# Patient Record
Sex: Male | Born: 1945
Health system: Southern US, Community
[De-identification: ages and names within clinical notes are randomized; demographics above are authoritative.]

## PROBLEM LIST (undated history)

## (undated) DIAGNOSIS — G8929 Other chronic pain: Secondary | ICD-10-CM

## (undated) DIAGNOSIS — Z8719 Personal history of other diseases of the digestive system: Secondary | ICD-10-CM

## (undated) DIAGNOSIS — R634 Abnormal weight loss: Secondary | ICD-10-CM

## (undated) DIAGNOSIS — R51 Headache: Secondary | ICD-10-CM

## (undated) DIAGNOSIS — IMO0001 Reserved for inherently not codable concepts without codable children: Secondary | ICD-10-CM

## (undated) DIAGNOSIS — N429 Disorder of prostate, unspecified: Secondary | ICD-10-CM

## (undated) DIAGNOSIS — R351 Nocturia: Secondary | ICD-10-CM

## (undated) DIAGNOSIS — E785 Hyperlipidemia, unspecified: Secondary | ICD-10-CM

## (undated) DIAGNOSIS — K2990 Gastroduodenitis, unspecified, without bleeding: Secondary | ICD-10-CM

## (undated) DIAGNOSIS — M1990 Unspecified osteoarthritis, unspecified site: Secondary | ICD-10-CM

## (undated) DIAGNOSIS — K648 Other hemorrhoids: Secondary | ICD-10-CM

## (undated) DIAGNOSIS — M436 Torticollis: Secondary | ICD-10-CM

## (undated) DIAGNOSIS — K2970 Gastritis, unspecified, without bleeding: Secondary | ICD-10-CM

## (undated) DIAGNOSIS — I10 Essential (primary) hypertension: Secondary | ICD-10-CM

## (undated) DIAGNOSIS — J449 Chronic obstructive pulmonary disease, unspecified: Secondary | ICD-10-CM

## (undated) DIAGNOSIS — J189 Pneumonia, unspecified organism: Secondary | ICD-10-CM

## (undated) DIAGNOSIS — Z5189 Encounter for other specified aftercare: Secondary | ICD-10-CM

## (undated) DIAGNOSIS — K259 Gastric ulcer, unspecified as acute or chronic, without hemorrhage or perforation: Secondary | ICD-10-CM

## (undated) HISTORY — DX: Abnormal weight loss: R63.4

## (undated) HISTORY — DX: Other chronic pain: G89.29

## (undated) HISTORY — DX: Hyperlipidemia, unspecified: E78.5

## (undated) HISTORY — DX: Gastric ulcer, unspecified as acute or chronic, without hemorrhage or perforation: K25.9

## (undated) HISTORY — DX: Gastritis, unspecified, without bleeding: K29.90

## (undated) HISTORY — DX: Gastritis, unspecified, without bleeding: K29.70

## (undated) HISTORY — DX: Personal history of other diseases of the digestive system: Z87.19

## (undated) HISTORY — DX: Chronic obstructive pulmonary disease, unspecified: J44.9

## (undated) HISTORY — PX: COLONOSCOPY: SHX174

---

## 1990-01-31 HISTORY — PX: CERVICAL FUSION: SHX112

## 2001-11-02 ENCOUNTER — Encounter: Payer: Self-pay | Admitting: General Surgery

## 2001-11-08 ENCOUNTER — Ambulatory Visit (HOSPITAL_COMMUNITY): Admission: RE | Admit: 2001-11-08 | Discharge: 2001-11-08 | Payer: Self-pay | Admitting: General Surgery

## 2001-11-08 ENCOUNTER — Encounter (INDEPENDENT_AMBULATORY_CARE_PROVIDER_SITE_OTHER): Payer: Self-pay | Admitting: Specialist

## 2002-03-26 ENCOUNTER — Emergency Department (HOSPITAL_COMMUNITY): Admission: EM | Admit: 2002-03-26 | Discharge: 2002-03-26 | Payer: Self-pay | Admitting: Emergency Medicine

## 2002-03-26 ENCOUNTER — Encounter: Payer: Self-pay | Admitting: Emergency Medicine

## 2004-09-19 ENCOUNTER — Inpatient Hospital Stay (HOSPITAL_COMMUNITY): Admission: EM | Admit: 2004-09-19 | Discharge: 2004-09-22 | Payer: Self-pay | Admitting: Emergency Medicine

## 2004-09-19 ENCOUNTER — Ambulatory Visit: Payer: Self-pay | Admitting: Internal Medicine

## 2004-09-19 ENCOUNTER — Ambulatory Visit: Payer: Self-pay | Admitting: Cardiology

## 2004-09-20 ENCOUNTER — Encounter: Payer: Self-pay | Admitting: Cardiology

## 2004-09-21 ENCOUNTER — Ambulatory Visit: Payer: Self-pay | Admitting: Internal Medicine

## 2004-09-30 ENCOUNTER — Ambulatory Visit: Payer: Self-pay | Admitting: Family Medicine

## 2004-10-12 ENCOUNTER — Ambulatory Visit: Payer: Self-pay | Admitting: Family Medicine

## 2005-03-25 ENCOUNTER — Ambulatory Visit: Payer: Self-pay | Admitting: Family Medicine

## 2006-10-06 DIAGNOSIS — G8929 Other chronic pain: Secondary | ICD-10-CM | POA: Insufficient documentation

## 2006-10-06 DIAGNOSIS — M545 Low back pain, unspecified: Secondary | ICD-10-CM | POA: Insufficient documentation

## 2006-10-06 HISTORY — DX: Other chronic pain: G89.29

## 2007-01-04 ENCOUNTER — Ambulatory Visit: Payer: Self-pay | Admitting: Family Medicine

## 2007-01-04 DIAGNOSIS — H60399 Other infective otitis externa, unspecified ear: Secondary | ICD-10-CM | POA: Insufficient documentation

## 2007-03-05 ENCOUNTER — Ambulatory Visit: Payer: Self-pay | Admitting: Family Medicine

## 2007-03-05 DIAGNOSIS — I10 Essential (primary) hypertension: Secondary | ICD-10-CM

## 2007-03-05 HISTORY — DX: Essential (primary) hypertension: I10

## 2007-03-26 ENCOUNTER — Ambulatory Visit: Payer: Self-pay | Admitting: Internal Medicine

## 2007-03-26 DIAGNOSIS — J449 Chronic obstructive pulmonary disease, unspecified: Secondary | ICD-10-CM | POA: Diagnosis present

## 2007-03-26 DIAGNOSIS — R0989 Other specified symptoms and signs involving the circulatory and respiratory systems: Secondary | ICD-10-CM | POA: Insufficient documentation

## 2007-03-26 DIAGNOSIS — J439 Emphysema, unspecified: Secondary | ICD-10-CM | POA: Insufficient documentation

## 2007-03-26 DIAGNOSIS — R0609 Other forms of dyspnea: Secondary | ICD-10-CM | POA: Insufficient documentation

## 2007-04-04 ENCOUNTER — Ambulatory Visit: Payer: Self-pay | Admitting: Family Medicine

## 2007-04-04 DIAGNOSIS — K219 Gastro-esophageal reflux disease without esophagitis: Secondary | ICD-10-CM | POA: Insufficient documentation

## 2007-04-20 DIAGNOSIS — K122 Cellulitis and abscess of mouth: Secondary | ICD-10-CM | POA: Insufficient documentation

## 2007-04-23 ENCOUNTER — Ambulatory Visit: Payer: Self-pay | Admitting: Family Medicine

## 2008-05-16 ENCOUNTER — Telehealth: Payer: Self-pay | Admitting: Family Medicine

## 2008-10-07 ENCOUNTER — Ambulatory Visit: Payer: Self-pay | Admitting: Family Medicine

## 2008-10-07 DIAGNOSIS — K5909 Other constipation: Secondary | ICD-10-CM | POA: Insufficient documentation

## 2008-10-09 ENCOUNTER — Telehealth: Payer: Self-pay | Admitting: Family Medicine

## 2008-10-10 ENCOUNTER — Ambulatory Visit: Payer: Self-pay | Admitting: Family Medicine

## 2008-10-29 ENCOUNTER — Telehealth (INDEPENDENT_AMBULATORY_CARE_PROVIDER_SITE_OTHER): Payer: Self-pay | Admitting: *Deleted

## 2009-01-01 ENCOUNTER — Ambulatory Visit: Payer: Self-pay | Admitting: Family Medicine

## 2009-01-01 DIAGNOSIS — M5137 Other intervertebral disc degeneration, lumbosacral region: Secondary | ICD-10-CM | POA: Insufficient documentation

## 2009-01-02 ENCOUNTER — Ambulatory Visit: Payer: Self-pay | Admitting: Family Medicine

## 2009-01-05 ENCOUNTER — Telehealth: Payer: Self-pay | Admitting: Family Medicine

## 2009-01-06 ENCOUNTER — Encounter: Admission: RE | Admit: 2009-01-06 | Discharge: 2009-01-06 | Payer: Self-pay | Admitting: Family Medicine

## 2009-01-07 ENCOUNTER — Telehealth: Payer: Self-pay | Admitting: Family Medicine

## 2009-01-14 ENCOUNTER — Encounter: Admission: RE | Admit: 2009-01-14 | Discharge: 2009-01-14 | Payer: Self-pay | Admitting: Family Medicine

## 2009-01-19 ENCOUNTER — Telehealth: Payer: Self-pay | Admitting: Family Medicine

## 2009-01-28 ENCOUNTER — Telehealth: Payer: Self-pay | Admitting: Family Medicine

## 2009-01-29 ENCOUNTER — Telehealth: Payer: Self-pay | Admitting: Family Medicine

## 2009-02-04 ENCOUNTER — Encounter: Admission: RE | Admit: 2009-02-04 | Discharge: 2009-02-04 | Payer: Self-pay | Admitting: Family Medicine

## 2009-02-23 ENCOUNTER — Ambulatory Visit: Payer: Self-pay | Admitting: Family Medicine

## 2009-02-23 DIAGNOSIS — J45909 Unspecified asthma, uncomplicated: Secondary | ICD-10-CM | POA: Insufficient documentation

## 2009-04-13 ENCOUNTER — Telehealth: Payer: Self-pay | Admitting: Family Medicine

## 2009-04-23 ENCOUNTER — Emergency Department (HOSPITAL_COMMUNITY): Admission: EM | Admit: 2009-04-23 | Discharge: 2009-04-23 | Payer: Self-pay | Admitting: Emergency Medicine

## 2009-05-01 ENCOUNTER — Telehealth: Payer: Self-pay | Admitting: Family Medicine

## 2010-03-02 NOTE — Progress Notes (Signed)
Summary: Med refill  Phone Note Call from Patient   Caller: Patient Reason for Call: Refill Medication Summary of Call: Patient states his RX is not at the drug strore - was it sent in?  Name of drug store or RX not left.  Initial call taken by: Everrett Coombe,  May 01, 2009 1:04 PM  Follow-up for Phone Call        Pt called back to adv that he needs to have Rx for meds: Tenoretic 50 50-25 Mg   -and-   Nexium 40 Mg..... Pt adv that Rx for same can be [facility] [place].  Pt can be reached at [phone] with any questions or concerns.  Follow-up by: Debbra Riding,  May 01, 2009 1:43 PM    Prescriptions: NEXIUM 40 MG  CPDR (ESOMEPRAZOLE MAGNESIUM) Take  one 30-60 min before first meal of the day  #100 x 0   Entered by:   Kern Reap CMA (AAMA)   Authorized by:   Roderick Pee MD   Signed by:   Kern Reap CMA (AAMA) on 05/01/2009   Method used:   [facility]  [place] [address] [facility]       [address]       [place], [place]  [address]       Ph: [phone] or [phone]       Fax: [phone]   RxID:   [id number] TENORETIC 50 50-25 MG  TABS (ATENOLOL-CHLORTHALIDONE) 1/2 tab qam  #100 Tablet x 0   Entered by:   Kern Reap CMA (AAMA)   Authorized by:   Roderick Pee MD   Signed by:   Kern Reap CMA (AAMA) on 05/01/2009   Method used:   [facility]        [facility]  [place] [address] [facility]       [address]       [place], [place]  [address]       Ph: [phone] or [phone]       Fax: [phone]   RxID:   [id number]

## 2010-03-02 NOTE — Assessment & Plan Note (Signed)
Summary: constipation/dm   Vital Signs:  Patient profile:   65 year old male BP sitting:   110 / 80  (left arm) Cuff size:   regular  Vitals Entered By: Kern Reap CMA Duncan Dull) (October 10, 2008 1:57 PM)  Reason for Visit constipation  Primary Care Provider:  Alonza Smoker   History of Present Illness: Michael Bender is a 65 year old male, who comes in today for evaluation of constipation.  Because of his chronic narcotic use for chronic, pain.  He's had trouble with constipation.  Early in the week.  We tried different strategies nothing has worked.  Allergies: 1)  ! * Nubane  Past History:  Past medical, surgical, family and social histories (including risk factors) reviewed for relevance to current acute and chronic problems.  Past Medical History: Reviewed history from 04/04/2007 and no changes required. chronic pain cervical surgery x 4 t/h pneumonia with atrial fibrillation recovered without sequelae COPD GERD  Past Surgical History: Reviewed history from 10/06/2006 and no changes required. cervical spine X4 I & H rectal prolapse pneumonia LLL  08/06 atrial fibrillation/anemia  Family History: Reviewed history from 03/26/2007 and no changes required. Cancer ? etiology Father Heart dz mid 62's   Social History: Reviewed history from 03/26/2007 and no changes required. Single Divorced Quit in 2005 Alcohol use-no Drug use-no Regular exercise-yes  Review of Systems      See HPI  Physical Exam  General:  Well-developed,well-nourished,in no acute distress; alert,appropriate and cooperative throughout examination   Impression & Recommendations:  Problem # 1:  CONSTIPATION, CHRONIC (ICD-564.09) Assessment Unchanged  His updated medication list for this problem includes:    Miralax Pack (Polyethylene glycol 3350) .Marland Kitchen... 1 daily    Golytely 236 Gm Solr (Peg 3350-kcl-nabcb-nacl-nasulf) .Marland Kitchen... Take 4 oz every 15 minutes until bottle gone  Orders:  Prescription Created Electronically [phone])  Complete Medication List: 1)  Methadone Hcl 10 Mg Tabs (Methadone hcl) .... Take 1 tablet by mouth three times a day 2)  Amitriptyline Hcl 100 Mg Tabs (Amitriptyline hcl) .Marland Kitchen.. 1 tab two times a day 3)  Diazepam 5 Mg Tabs (Diazepam) .... Take 1 tablet by mouth three times a day 4)  Tenoretic 50 50-25 Mg Tabs (Atenolol-chlorthalidone) .... 1/2 tab qam 5)  Miralax Pack (Polyethylene glycol 3350) .Marland Kitchen.. 1 daily 6)  Nexium 40 Mg Cpdr (Esomeprazole magnesium) .... Take  one 30-60 min before first meal of the day 7)  Golytely 236 Gm Solr (Peg 3350-kcl-nabcb-nacl-nasulf) .... Take 4 oz every 15 minutes until bottle gone  Patient Instructions: 1)  begin 4 ounces of GoLYTELY every 15 minutes until the 2 L are empty or until you have a good bowel movement. 2)  Tomorrow begin milk of Magnesia 4 ounces twice a day and MiraLax once daily.  Also, drink 30 ounces of water daily and add fruit and fiber tear dye Prescriptions: GOLYTELY 236 GM SOLR (PEG 3350-KCL-NABCB-NACL-NASULF) take 4 oz every 15 minutes until bottle gone  #2 liter x 0   Entered by:   Kern Reap CMA (AAMA)   Authorized by:   Roderick Pee MD   Signed by:   Kern Reap CMA (AAMA) on 10/10/2008   Method used:   [facility]        [facility]  [place] [address]       [address]       [place], [place]  [address]       Ph: [phone] or [phone]  Fax: [phone]   RxID:   [id number]

## 2010-03-02 NOTE — Progress Notes (Signed)
Summary: pt wants to know how long he waits to do enema after magnesium  Phone Note Call from Patient Call back at Home Phone [phone]   Caller: Patient Call For: Roderick Pee MD Summary of Call: Pt said that he thought he was told to take 2 bottles of magnesium citrate and then use fleet enema. Pt wants to know how long does he wait after he drinks the magnesium, before he uses the enema.  Initial call taken by: Lucy Antigua,  October 09, 2008 4:48 PM  Follow-up for Phone Call        Pt. has not had BM, scheduled with Dr. Tawanna Cooler today.  Concerned about obstruction? Follow-up by: Lynann Beaver CMA,  October 10, 2008 10:26 AM  Additional Follow-up for Phone Call Additional follow up Details #1::        one hour Additional Follow-up by: Roderick Pee MD,  October 10, 2008 2:47 PM    Additional Follow-up for Phone Call Additional follow up Details #2::    left message on machine for patient  Follow-up by: Kern Reap CMA Duncan Dull),  October 10, 2008 5:31 PM

## 2010-03-02 NOTE — Progress Notes (Signed)
Summary: nexium refill  Phone Note Refill Request   Refills Requested: Medication #1:  NEXIUM 40 MG  CPDR Take  one 30-60 min before first meal of the day. Initial call taken by: Kern Reap CMA,  May 16, 2008 1:13 PM      Prescriptions: NEXIUM 40 MG  CPDR (ESOMEPRAZOLE MAGNESIUM) Take  one 30-60 min before first meal of the day  #100 x 0   Entered by:   Kern Reap CMA   Authorized by:   Roderick Pee MD   Signed by:   Kern Reap CMA on 05/16/2008   Method used:   [facility]        [facility]  [place] [address]       [address]       [place], [place]  [address]       Ph: [phone] or [phone]       Fax: [phone]   RxID:   [id number]

## 2010-03-02 NOTE — Progress Notes (Signed)
Summary: continued constipation  Phone Note Call from Patient   Caller: Patient Call For: Roderick Pee MD Summary of Call: Pt calls stating he took the Magnesium Citrate, and has not had a BM, just a small amount of "leaking".  Is very concerned. [phone] Initial call taken by: Lynann Beaver CMA,  October 09, 2008 10:07 AM  Follow-up for Phone Call        double with the mag citrate to 6 ounces twice a day.  Also may use a fleets enema Follow-up by: Roderick Pee MD,  October 09, 2008 10:09 AM  Additional Follow-up for Phone Call Additional follow up Details #1::        Pt given Dr. Nelida Meuse recommendations. Additional Follow-up by: Lynann Beaver CMA,  October 09, 2008 10:14 AM

## 2010-03-02 NOTE — Assessment & Plan Note (Signed)
Summary: follow up/med refill/per Dr. Beatrix Fetters   Vital Signs:  Patient profile:   65 year old male Weight:      185 pounds BMI:     25.90 Temp:     98.2 degrees F oral BP sitting:   120 / 80  (left arm) Cuff size:   regular  Vitals Entered By: Kern Reap CMA (AAMA) (October 07, 2008 11:12 AM)  Reason for Visit follow up meds  Primary Care Provider:  Alonza Smoker   History of Present Illness: Michael Bender is a 65 year old male, who comes in today for evaluation of constipation.  He has chronic pain in his on methadone 10 mg t.i.d., Valium 5 mg t.i.d., and Septra with chronic constipation.  Because of his above medications.  A normal colonoscopy 3 years ago.  Most recently he states the constipation has flared up his reflux esophagitis.  He started Nexium one tablet yesterday.  It helped.  The reflexes burning midepigastric not related to eating.  Recently, he is not had a bowel movement except for every 6 days.  He's had to resort to mag citrate 300 cc daily to have a bowel movement.  Allergies: 1)  ! * Nubane  Past History:  Past medical, surgical, family and social histories (including risk factors) reviewed for relevance to current acute and chronic problems.  Past Medical History: Reviewed history from 04/04/2007 and no changes required. chronic pain cervical surgery x 4 t/h pneumonia with atrial fibrillation recovered without sequelae COPD GERD  Past Surgical History: Reviewed history from 10/06/2006 and no changes required. cervical spine X4 I & H rectal prolapse pneumonia LLL  08/06 atrial fibrillation/anemia  Family History: Reviewed history from 03/26/2007 and no changes required. Cancer ? etiology Father Heart dz mid 83's   Social History: Reviewed history from 03/26/2007 and no changes required. Single Divorced Quit in 2005 Alcohol use-no Drug use-no Regular exercise-yes  Review of Systems      See HPI  Physical Exam  General:   Well-developed,well-nourished,in no acute distress; alert,appropriate and cooperative throughout examination Heart:  Normal rate and regular rhythm. S1 and S2 normal without gallop, murmur, click, rub or other extra sounds. Abdomen:  Bowel sounds positive,abdomen soft and non-tender without masses, organomegaly or hernias noted.   Impression & Recommendations:  Problem # 1:  GERD (ICD-530.81) Assessment Deteriorated  His updated medication list for this problem includes:    Nexium 40 Mg Cpdr (Esomeprazole magnesium) .Marland Kitchen... Take  one 30-60 min before first meal of the day  Orders: Prescription Created Electronically [phone])  Problem # 2:  PAIN, CHRONIC NEC (ICD-338.29) Assessment: Unchanged  Orders: Prescription Created Electronically [phone])  Problem # 3:  CONSTIPATION, CHRONIC (ICD-564.09) Assessment: Deteriorated  His updated medication list for this problem includes:    Miralax Pack (Polyethylene glycol 3350) .Marland Kitchen... 1 daily  Orders: Prescription Created Electronically [phone])  Complete Medication List: 1)  Methadone Hcl 10 Mg Tabs (Methadone hcl) .... Take 1 tablet by mouth three times a day 2)  Amitriptyline Hcl 100 Mg Tabs (Amitriptyline hcl) .Marland Kitchen.. 1 tab two times a day 3)  Diazepam 5 Mg Tabs (Diazepam) .... Take 1 tablet by mouth three times a day 4)  Tenoretic 50 50-25 Mg Tabs (Atenolol-chlorthalidone) .... 1/2 tab qam 5)  Miralax Pack (Polyethylene glycol 3350) .Marland Kitchen.. 1 daily 6)  Nexium 40 Mg Cpdr (Esomeprazole magnesium) .... Take  one 30-60 min before first meal of the day  Patient Instructions: 1)  during 30 ounces of water daily, add  fruit and fiber to your diet, began mag citrate 6 ounces daily, x 2 weeks. 2)  At the end of two weeks let's decrease the dose of mag citrate to 3 ounces daily x 2 weeks then we will discuss how to move forward

## 2010-03-02 NOTE — Assessment & Plan Note (Signed)
Summary: SOB/ MBW   Visit Type:  Initial Consult PCP:  Alonza Smoker  Chief Complaint:  PULMONARYRY CONSULT.  History of Present Illness: 65 yowm quit 2005 no symptoms then after pna 3y ago hosp [facility] sob ever since stable pattern doe x hills otherwise not predictable. 30 lb wt gain since quit smoking,  daily episodes lasting 15 secs comes and goes with no obvious trigger or alleviating  with with heavy wt on throat, almost can't get speach out but no hoarseness, not active sleeping, no cough or ex cp.  Pt denies any significant sore throat, nasal congestion or excess secretions, fever, chills, sweats, unintended wt loss, pleuritic or exertional cp, orthopnea pnd or leg swelling.  Pt also denies any obvious fluctuation in symptoms with weather or environmental change or other alleviating or aggravating factors.       Current Meds:  METHADONE HCL 10 MG  TABS (METHADONE HCL) Take 1 tablet by mouth three times a day AMITRIPTYLINE HCL 100 MG  TABS (AMITRIPTYLINE HCL) 1 tab two times a day DIAZEPAM 5 MG  TABS (DIAZEPAM) Take 1 tablet by mouth three times a day TENORETIC 50 50-25 MG  TABS (ATENOLOL-CHLORTHALIDONE) 1/2 tab qam MIRALAX   PACK (POLYETHYLENE GLYCOL 3350) 1 daily       Current Allergies: ! * NUBANE  Past Medical History:    Reviewed history from 03/05/2007 and no changes required:       chronic pain       cervical surgery x 4       t/h       pneumonia with atrial fibrillation recovered without sequelae   Family History:    Reviewed history and no changes required:       Cancer ? etiology Father       Heart dz mid 24's   Social History:    Reviewed history from 01/04/2007 and no changes required:       Single       Divorced       Quit in 2005       Alcohol use-no       Drug use-no       Regular exercise-yes    Review of Systems  The patient denies anorexia, fever, weight loss, weight gain, vision loss, decreased hearing, hoarseness, chest pain, syncope,  peripheral edema, hemoptysis, abdominal pain, melena, hematochezia, severe indigestion/heartburn, hematuria, incontinence, muscle weakness, suspicious skin lesions, transient blindness, difficulty walking, depression, unusual weight change, abnormal bleeding, enlarged lymph nodes, and angioedema.     Vital Signs:  Patient Profile:   65 Years Old Male Height:     71 inches (180.34 cm) Weight:      200 pounds (90.91 kg) O2 Sat:      97 % O2 treatment:    Room Air Temp:     98.1 degrees F oral Pulse rate:   62 / minute BP sitting:   108 / 80  (left arm)  Vitals Entered By: Michel Bickers CMA (March 26, 2007 10:37 AM)             Comments Pt c/o increased SOB x 4 months. Medications reviewed.     Physical Exam  in general this is a minimally chronically ill-appearing elderly white male who appears about 5 years older than stated age in no acute distress.vital signs unremarkable HEENT mild turbinate edema.  Oropharynx no thrush or excess pnd or cobblestoning.  No JVD or cervical adenopathy. Mild accessory muscle hypertrophy. Trachea midline, nl thryroid.  Classic pseudowheeze on FVC. Chest was hyperinflated by percussion with diminished breath sounds and moderate increased exp time without wheeze. Hoover sign positive at mid inspiration. Regular rate and rhythm without murmur gallop or rub or increase P2.  Abd: no hsm, nl excursion. Ext warm without C,C or E.       Problem # 1:  COPD UNSPECIFIED (ICD-496) he does have evidence of at least mild to moderate COPD but note that he has no symptoms at all when he quit smoking.  Senna developing symptoms now would be like seen an Teacher, music wreck his car while driving his kids to school.  I therefore elected to bring him back for set of PFTs but no aggressive treatment directed COPD yet.  Note he also has paroxysms of dyspnea they feel like his throat is closing up on them and do not occur reproducibly with exercise but rather occur  mostly sitting, rarely sleeping.  This suggests refluxing may be amenable to treatment with PPI therapy.  The other possibility is that this represents panic disorder, a much more difficult problem to treat. Orders: Consultation Level V [phone])   Medications Added to Medication List This Visit: 1)  Miralax Pack (Polyethylene glycol 3350) .Marland Kitchen.. 1 daily 2)  Nexium 40 Mg Cpdr (Esomeprazole magnesium) .... Take  one 30-60 min before first meal of the day   Patient Instructions: 1)  Start Nexium 40 mg 30 min before first meal (Prilosec) 2)  GERD (gastroesophageal reflux disease) was discussed. It is a common cause of respiratory symptoms. It commonly presents in the absence of heartburn. GERD can be treated with medication, but also with lifestyle changes including avoidance of late meals, excessive alcohol, smoking cessation, and avoidance of foods and drinks including those that the person notes to cause symptoms, fatty foods, chocolate, peppermint, colas, red wine, and acidic juices such as orange juice. NO MINT NO MENthol Products 3)  Please schedule a follow-up appointment in 1 month. 4)  You have been asked to make an appointment for a Pulmonary Function Test (Breathing Test) prior to or at the time of your next visit. Use medications as usual unless otherwise instructed.    ]

## 2010-03-02 NOTE — Assessment & Plan Note (Signed)
Summary: pt concerned about pneumonia/dm   Vital Signs:  Patient profile:   65 year old male Weight:      198 pounds Temp:     98.5 degrees F oral BP sitting:   108 / 70  (left arm) Cuff size:   regular  Vitals Entered By: Kern Reap CMA Duncan Dull) (February 23, 2009 3:16 PM)  Reason for Visit chest congestion  Primary Care Provider:  Alonza Smoker   History of Present Illness: Michael Bender is a 65 year old single male, nonsmoker, who burns a wood stove24/7 to heat.  His house, who comes in with a two week history of cough.  He was seen by his physician in [place] two weeks ago and was given a Z-Pak for her cough.  It didn't help.  He went back today for trigger point injections by the same physician in [place] who prescribed him Augmentin.  He comes in today wanting check by me to be sure there is nothing else going on.  He has no fever, earache, or sputum production.  He had pneumonia about 3 years ago that required hospitalization  Allergies: 1)  ! * Nubane  Past History:  Past medical, surgical, family and social histories (including risk factors) reviewed for relevance to current acute and chronic problems.  Past Medical History: Reviewed history from 04/04/2007 and no changes required. chronic pain cervical surgery x 4 t/h pneumonia with atrial fibrillation recovered without sequelae COPD GERD  Past Surgical History: Reviewed history from 10/06/2006 and no changes required. cervical spine X4 I & H rectal prolapse pneumonia LLL  08/06 atrial fibrillation/anemia  Family History: Reviewed history from 03/26/2007 and no changes required. Cancer ? etiology Father Heart dz mid 16's   Social History: Reviewed history from 03/26/2007 and no changes required. Single Divorced Quit in 2005 Alcohol use-no Drug use-no Regular exercise-yes  Review of Systems      See HPI  Physical Exam  General:  Well-developed,well-nourished,in no acute distress; alert,appropriate and  cooperative throughout examination Head:  Normocephalic and atraumatic without obvious abnormalities. No apparent alopecia or balding. Eyes:  No corneal or conjunctival inflammation noted. EOMI. Perrla. Funduscopic exam benign, without hemorrhages, exudates or papilledema. Vision grossly normal. Ears:  External ear exam shows no significant lesions or deformities.  Otoscopic examination reveals clear canals, tympanic membranes are intact bilaterally without bulging, retraction, inflammation or discharge. Hearing is grossly normal bilaterally. Nose:  External nasal examination shows no deformity or inflammation. Nasal mucosa are pink and moist without lesions or exudates. Mouth:  Oral mucosa and oropharynx without lesions or exudates.  Teeth in good repair. Neck:  No deformities, masses, or tenderness noted. Chest Wall:  No deformities, masses, tenderness or gynecomastia noted. Lungs:  is symmetrical.  Breath sounds wheeze, seemed worse on the left than the right   Impression & Recommendations:  Problem # 1:  EXTRINSIC ASTHMA, UNSPECIFIED (ICD-493.00) Assessment New  His updated medication list for this problem includes:    Prednisone 20 Mg Tabs (Prednisone) ..... Uad  Orders: Prescription Created Electronically [phone])  Complete Medication List: 1)  Methadone Hcl 10 Mg Tabs (Methadone hcl) .... Take 1 tablet by mouth three times a day 2)  Amitriptyline Hcl 100 Mg Tabs (Amitriptyline hcl) .Marland Kitchen.. 1 tab two times a day 3)  Diazepam 5 Mg Tabs (Diazepam) .... Take 1 tablet by mouth three times a day 4)  Tenoretic 50 50-25 Mg Tabs (Atenolol-chlorthalidone) .... 1/2 tab qam 5)  Miralax Pack (Polyethylene glycol 3350) .Marland Kitchen.. 1 daily 6)  Nexium 40 Mg Cpdr (Esomeprazole magnesium) .... Take  one 30-60 min before first meal of the day 7)  Golytely 236 Gm Solr (Peg 3350-kcl-nabcb-nacl-nasulf) .... Take 4 oz every 15 minutes until bottle gone 8)  Valium 10 Mg Tabs (Diazepam) .... Take 1 tablet by mouth  three times a day 9)  Prednisone 20 Mg Tabs (Prednisone) .... Uad 10)  Hydromet 5-1.5 Mg/29ml Syrp (Hydrocodone-homatropine) .Marland Kitchen.. 1 or 2 tsps three times a day as needed  Patient Instructions: 1)  drinks 30 ounces of water daily, run a vaporizer or humidifier in his bedroom at night. 2)  Begin prednisone two tabs x 3 days, one x 3 days, a half x 3 days, then half a tablet Monday, Wednesday, Friday, for a two week taper. 3)  Take the Augmentin one daily x 1 week.  Stop it immediately if u developed diarrhea. 4)  Also, he may take one or 2 teaspoons of Hydromet at bedtime as needed for nighttime cough Prescriptions: HYDROMET 5-1.5 MG/5ML SYRP (HYDROCODONE-HOMATROPINE) 1 or 2 tsps three times a day as needed  #8oz x 1   Entered and Authorized by:   Roderick Pee MD   Signed by:   Roderick Pee MD on 02/23/2009   Method used:   Print then Give to Patient   RxID:   [id number] PREDNISONE 20 MG TABS (PREDNISONE) UAD  #30 x 1   Entered and Authorized by:   Roderick Pee MD   Signed by:   Roderick Pee MD on 02/23/2009   Method used:   [facility]        [facility]  [place] [address] [facility]       [address]       [place], [place]  [address]       Ph: [phone] or [phone]       Fax: [phone]   RxID:   [id number]

## 2010-03-02 NOTE — Assessment & Plan Note (Signed)
Summary: 1 month rov/njr   Vital Signs:  Patient Profile:   65 Years Old Male Height:     71 inches (180.34 cm) Weight:      198 pounds (90.00 kg) Temp:     98.1 degrees F (36.72 degrees C) oral Pulse rate:   69 / minute BP sitting:   129 / 73  (right arm)  Vitals Entered By: Arcola Jansky, RN (April 04, 2007 11:01 AM)                 PCP:  Alonza Smoker  Chief Complaint:  F/U GERD.  History of Present Illness: Michael Bender is a 65 year old male, who comes in today for follow-up of hypertension, and reflux esophagitis.  He is on Tenoretic 50 -- 25 one half tablet q.a.m.  BP is 129/73.  Repeat by me 120/80.  Is having no side effects from his medication.  He had a sensation of shortness of breath.  Pulmonary workup by Dr. Sherene Sires and it was normal.,  Except for some underlying COPD from previous smoking.  The Dr. did not think it was any lung disease causing his symptoms.  He felt was most likely reflux esophagitis.  Michael Bender had a pulmonary function study done in [place].  It showed an FEV is 74%.  Dr. Sherene Sires once to repeat that,IM  Not sure that is necessary since there he had one done 3 weeks ago.  Dr. Sherene Sires put him on Nexium 40 mg q.a.m. with the standard anti-reflux home program.  However, he did not talk to him about not eating for two hours prior to bedtime nor sleeping on two pillows.  With the medication, and the treatment program, to Dr. Sherene Sires as prescribed.  His symptoms have markedly diminished.  He otherwise feels well.    Current Allergies (reviewed today): ! Rolland Bimler  Past Medical History:    Reviewed history from 03/05/2007 and no changes required:       chronic pain       cervical surgery x 4       t/h       pneumonia with atrial fibrillation recovered without sequelae       COPD       GERD   Family History:    Reviewed history from 03/26/2007 and no changes required:       Cancer ? etiology Father       Heart dz mid 17's   Social History:    Reviewed history  from 03/26/2007 and no changes required:       Single       Divorced       Quit in 2005       Alcohol use-no       Drug use-no       Regular exercise-yes    Review of Systems      See HPI   Physical Exam  General:     Well-developed,well-nourished,in no acute distress; alert,appropriate and cooperative throughout examination Heart:     120/80    Impression & Recommendations:  Problem # 1:  COPD (ICD-496) Assessment: Unchanged  Problem # 2:  GERD (ICD-530.81) Assessment: Improved  His updated medication list for this problem includes:    Nexium 40 Mg Cpdr (Esomeprazole magnesium) .Marland Kitchen... Take  one 30-60 min before first meal of the day   Problem # 3:  HYPERTENSION NEC (ICD-997.91) Assessment: Improved  Complete Medication List: 1)  Methadone Hcl 10 Mg Tabs (Methadone hcl) .... Take  1 tablet by mouth three times a day 2)  Amitriptyline Hcl 100 Mg Tabs (Amitriptyline hcl) .Marland Kitchen.. 1 tab two times a day 3)  Diazepam 5 Mg Tabs (Diazepam) .... Take 1 tablet by mouth three times a day 4)  Tenoretic 50 50-25 Mg Tabs (Atenolol-chlorthalidone) .... 1/2 tab qam 5)  Miralax Pack (Polyethylene glycol 3350) .Marland Kitchen.. 1 daily 6)  Nexium 40 Mg Cpdr (Esomeprazole magnesium) .... Take  one 30-60 min before first meal of the day   Patient Instructions: 1)  continue the Nexium every day forever.  If your insurance will not cover the Nexium to get over-the-counter Prilosec. 2)  Continue the Tenoretic 50 -- 25 one half tablet q.a.m.  Check your blood pressure weekly.  Our goal is to keep her blood pressure 135/85 or less. 3)  Remember nothing to eat or drink for two hours prior to bedtime in sleep on two pillows.    Prescriptions: NEXIUM 40 MG  CPDR (ESOMEPRAZOLE MAGNESIUM) Take  one 30-60 min before first meal of the day  #100 x 4   Entered and Authorized by:   Roderick Pee MD   Signed by:   Roderick Pee MD on 04/04/2007   Method used:   Print then Give to Patient   RxID:    [id number]  ]

## 2010-03-02 NOTE — Progress Notes (Signed)
Summary: ret call  Phone Note Call from Patient   Caller: son,jamie,[phone] Call For: Leonda Cristo Summary of Call: Returning Rachel's call.  Not sure what it's about.  He is not patient here, but his dad is.  Call back to him or to his dad tomorrow, Thurs.   Initial call taken by: Rudy Jew, RN,  January 07, 2009 12:44 PM  Follow-up for Phone Call        Phone Call Completed Follow-up by: Kern Reap CMA Duncan Dull),  January 09, 2009 11:07 AM

## 2010-03-02 NOTE — Assessment & Plan Note (Signed)
Summary: breathing problem/jls   Vital Signs:  Patient Profile:   65 Years Old Male Weight:      200 pounds (90.91 kg) Temp:     98.1 degrees F (36.72 degrees C) oral Pulse rate:   86 / minute BP sitting:   172 / 98  Pt. in pain?   yes    Location:   head    Intensity:   8    Type:       THROBBING  Vitals Entered By: Arcola Jansky, RN (March 05, 2007 10:01 AM)                  Chief Complaint:  "FEELS LIKE I HAVE A COLLAR AROUND MY NECK"   ACTIVITY INTOLERANCE, SOB, L ARM SHAKING, and MHA.  History of Present Illness: Michael Bender is a 65 year old single male neighbor, who comes in today for evaluation of 3 problems.  He, says he's had a sensation of difficulty swallowing and choking.  He says he swallowing food well.  It does not get stuck.  A ring lies down.  He has a choking sensation.  He's had no nausea, vomiting, diarrhea, nor weight loss.  He does have constipation related to his chronic narcotic use for chronic pain.  He controls that with over-the-counter MiraLax.  He does have ADD and flight of ideas it's very difficult to get a coherent history.  He also was noted by Marylene Land and by me to have a markedly elevated blood pressure.  Running 160 over hundred and  He also complains of shortness of breath.  He saw his chronic pain physician in [place] recently.  They did a chest x-ray.  Pulmonary functions and an pulse ox which he says was all normal.  He quit smoking 5 years ago.  His mother had hypertension and died at a young age of coronary disease.  His last blood pressure here was 122/70.  In November  Current Allergies (reviewed today): ! Rolland Bimler  Past Medical History:    Reviewed history from 01/04/2007 and no changes required:       chronic pain       cervical surgery x 4       t/h       pneumonia with atrial fibrillation recovered without sequelae   Social History:    Reviewed history from 01/04/2007 and no changes required:       Single  Divorced       Never Smoked       Alcohol use-no       Drug use-no       Regular exercise-yes   Risk Factors:  Tobacco use:  quit    Year quit:  05 Passive smoke exposure:  no   Review of Systems      See HPI   Physical Exam  General:     Well-developed,well-nourished,in no acute distress; alert,appropriate and cooperative throughout examination Head:     Normocephalic and atraumatic without obvious abnormalities. No apparent alopecia or balding. Eyes:     No corneal or conjunctival inflammation noted. EOMI. Perrla. Funduscopic exam benign, without hemorrhages, exudates or papilledema. Vision grossly normal. Ears:     External ear exam shows no significant lesions or deformities.  Otoscopic examination reveals clear canals, tympanic membranes are intact bilaterally without bulging, retraction, inflammation or discharge. Hearing is grossly normal bilaterally. Nose:     External nasal examination shows no deformity or inflammation. Nasal mucosa are pink and moist without lesions  or exudates. Mouth:     Oral mucosa and oropharynx without lesions or exudates.  Teeth in good repair. Neck:     No deformities, masses, or tenderness noted. Chest Wall:     No deformities, masses, tenderness or gynecomastia noted. Breasts:     No masses or gynecomastia noted Lungs:     Normal respiratory effort, chest expands symmetrically. Lungs are clear to auscultation, no crackles or wheezes. Heart:     Normal rate and regular rhythm. S1 and S2 normal without gallop, murmur, click, rub or other extra sounds. Abdomen:     Bowel sounds positive,abdomen soft and non-tender without masses, organomegaly or hernias noted.    Impression & Recommendations:  Problem # 1:  HYPERTENSION NEC (ICD-997.91) Assessment: New  Orders: EKG w/ Interpretation (93000)   Problem # 2:  OTHER SYMPTOMS INVOLVING HEAD AND NECK (ICD-784.99) Assessment: New  Orders: Pulmonary Referral (Pulmonary)    Complete Medication List: 1)  Methadone Hcl 10 Mg Tabs (Methadone hcl) .... Take 1 tablet by mouth three times a day 2)  Amitriptyline Hcl 100 Mg Tabs (Amitriptyline hcl) .Marland Kitchen.. 1 tab two times a day 3)  Diazepam 5 Mg Tabs (Diazepam) .... Take 1 tablet by mouth three times a day 4)  Fentora 600 Mcg Tabs (Fentanyl citrate) .Marland Kitchen.. 1 tab 4 times a day as needed 5)  Tenoretic 50 50-25 Mg Tabs (Atenolol-chlorthalidone) .... 1/2 tab qam   Patient Instructions: 1)  began Tenoretic 50 -- 25 one half tablet every morning.  Return in 4 weeks for reevaluation of your blood pressure.  Ideally he should purchase a digital blood pressure cuff and measure your blood pressure twice a day at home.  When you return in 4 weeks bring a log of all your blood pressure readings and the device so we can check and be, sure it's calibrated right. 2)  We will also be to set up for a pulmonary consult to evaluate this choking sensation.  And shortness of breath.    Prescriptions: TENORETIC 50 50-25 MG  TABS (ATENOLOL-CHLORTHALIDONE) 1/2 tab qam  #100 x 4   Entered and Authorized by:   Roderick Pee MD   Signed by:   Roderick Pee MD on 03/05/2007   Method used:   Print then Give to Patient   RxID:   [id number]  ]

## 2010-03-02 NOTE — Progress Notes (Signed)
Summary: hydrocodone refill  Phone Note Call from Patient   Summary of Call: pt is requesting a refill of hydrocodone cough syrup is this okay to fill? Initial call taken by: Kern Reap CMA Duncan Dull),  April 13, 2009 5:15 PM  Follow-up for Phone Call        Hydromet dispense 8 ounces directions one half to 1 teaspoon nightly p.r.n. cough, refills x 1 Follow-up by: Roderick Pee MD,  April 13, 2009 5:47 PM  Additional Follow-up for Phone Call Additional follow up Details #1::        fax sent to pharmacy Additional Follow-up by: Kern Reap CMA Duncan Dull),  April 14, 2009 9:49 AM

## 2010-03-02 NOTE — Progress Notes (Signed)
Summary: 2nd back injection order needed  Phone Note Call from Patient Call back at Home Phone [phone] Call back at c [phone]   Caller: live Call For: Nasean Zapf Summary of Call: Needs another injection into low back at [facility].  Had 1st injection 12-16 and got relief initially, but now pain is back.  [facility] says you will have to call them order before they can give 2nd injection.   Initial call taken by: Rudy Jew, RN,  January 19, 2009 2:14 PM  Follow-up for Phone Call        ok for #2 Follow-up by: Roderick Pee MD,  January 19, 2009 2:34 PM  Additional Follow-up for Phone Call Additional follow up Details #1::        Called [facility] and gave them verbal auth from Dr. Tawanna Cooler. Additional Follow-up by: Corky Mull,  January 19, 2009 3:14 PM

## 2010-04-19 ENCOUNTER — Other Ambulatory Visit: Payer: Self-pay | Admitting: Family Medicine

## 2010-05-03 ENCOUNTER — Ambulatory Visit (INDEPENDENT_AMBULATORY_CARE_PROVIDER_SITE_OTHER): Payer: Medicare Other | Admitting: Family Medicine

## 2010-05-03 ENCOUNTER — Ambulatory Visit (INDEPENDENT_AMBULATORY_CARE_PROVIDER_SITE_OTHER)
Admission: RE | Admit: 2010-05-03 | Discharge: 2010-05-03 | Disposition: A | Discharge status: Home / Self Care | Payer: Medicare Other | Source: Ambulatory Visit | Attending: Family Medicine | Admitting: Family Medicine

## 2010-05-03 ENCOUNTER — Encounter: Payer: Self-pay | Admitting: Family Medicine

## 2010-05-03 VITALS — BP 120/80 | Temp 98.6°F | Ht 71.0 in | Wt 184.0 lb

## 2010-05-03 DIAGNOSIS — M79609 Pain in unspecified limb: Secondary | ICD-10-CM

## 2010-05-03 DIAGNOSIS — R0602 Shortness of breath: Secondary | ICD-10-CM

## 2010-05-03 DIAGNOSIS — M79605 Pain in left leg: Secondary | ICD-10-CM

## 2010-05-03 DIAGNOSIS — M79604 Pain in right leg: Secondary | ICD-10-CM | POA: Insufficient documentation

## 2010-05-03 NOTE — Patient Instructions (Signed)
Go to the main office now  For your chest x-ray.  Somebody will call you with the date and time to go to cardiology to have the blood flow in your legs, evaluated.  After that study.  Return to see me for follow-up

## 2010-05-03 NOTE — Progress Notes (Signed)
  Subjective:    Patient ID: Michael Bender, male    DOB: 01-11-1946, 65 y.o.   MRN: [id number]  HPI Michael Bender is a delightful, 65 year old, single male ex smoker, who comes in today with a 6 month history of increasing weakness and tingling in his lower extremities secondary to exertion.  He does this about 6 months ago.  It has gotten worse.  The last couple months.  He said no history of previous vascular problems, hypertension, etc.  He has had a history of COPD from smoking however, he does not smoke anymore.  He does have chronic pain syndrome, treated by the pain clinic in [place].  Is currently taking Elavil 100 b.i.d., Valium, 5 mg t.i.d.,   Oxycodone 10 mg q.i.d..  He's been off the methadone now for two months and feels emotionally better.  He stated that he wanted to stop the methadone because he became so apathetic.  His blood pressure is controlled with Tenoretic 5025 dose one half tab q.a.m..  He's had no history of diabetes, nor hyperlipidemia.  He also complains of some shortness of breath, but her friend was recently diagnosed with lung cancer.  He denies any chest pain with exertion.  He would like to have a chest x-ray, which I think is reasonable   Review of Systems    General and cardiopulmonary is systems otherwise negative Objective:   Physical Exam    Well-developed well-nourished, male in no acute distress.  Examination abdomen is negative.  Specifically, the aorta was palpable.  No bruit.  Femoral, popliteal, dorsalis pedis and posterior tibial pulses are 2+ out of 4.  Neurologic exam of lower extremities shows normal muscle strength.  Reflexes are 1+ out of 4, however, is history of lumbar disk disease and chronic low and upper back pain.  Sensation is altered.  He cannot tell the difference between light touch and pinprick.  The skin is normal.  No ulceration    Assessment & Plan:  Bilateral lower extremity pain and numbness with exertion.  Because of his history of  hypertension and smoking.  Will first do a vascular study to be, sure it's not an arterial problem.  If that is normal and there will pursue neurologic workup

## 2010-05-06 ENCOUNTER — Other Ambulatory Visit: Payer: Self-pay | Admitting: Cardiology

## 2010-05-06 DIAGNOSIS — I739 Peripheral vascular disease, unspecified: Secondary | ICD-10-CM

## 2010-05-10 ENCOUNTER — Encounter: Payer: Medicare Other | Admitting: Cardiology

## 2010-05-17 ENCOUNTER — Encounter (INDEPENDENT_AMBULATORY_CARE_PROVIDER_SITE_OTHER): Payer: Medicare Other | Admitting: Cardiology

## 2010-05-17 DIAGNOSIS — I739 Peripheral vascular disease, unspecified: Secondary | ICD-10-CM

## 2010-05-20 ENCOUNTER — Encounter: Payer: Self-pay | Admitting: Family Medicine

## 2010-05-21 ENCOUNTER — Telehealth: Payer: Self-pay | Admitting: *Deleted

## 2010-05-21 DIAGNOSIS — M79604 Pain in right leg: Secondary | ICD-10-CM

## 2010-05-21 DIAGNOSIS — M79605 Pain in left leg: Secondary | ICD-10-CM

## 2010-05-21 NOTE — Telephone Encounter (Signed)
Message copied by Kern Reap on Fri May 21, 2010 11:20 AM ------      Message from: TODD, JEFFREY A      Created: Thu May 20, 2010  6:03 PM       Please call blood flow studies, normal,,,,,,,,,,,,,, please refer him to neurology for a workup

## 2010-05-21 NOTE — Telephone Encounter (Signed)
patient  Is aware 

## 2010-06-18 NOTE — Discharge Summary (Signed)
NAMETRENTEN, WATCHMAN NO.:  [id number]   MEDICAL RECORD NO.:  [id number]          PATIENT TYPE:  INP   LOCATION:  1417                         FACILITY:  [facility]   PHYSICIAN:  Rene Paci, M.D. LHCDATE OF BIRTH:  [birth year]   DATE OF ADMISSION:  09/19/2004  DATE OF DISCHARGE:                                 DISCHARGE SUMMARY   DISCHARGE DIAGNOSES:  1.  Left lower lobe necrotizing pneumonia with associated fevers, night      sweats, weight loss, anorexia, and shortness of breath. Continue 3 weeks      total Avelox with close outpatient follow-up with primary M.D. Consider      referral by pulmonary if not resolved on CT in the next 4 weeks.  2.  Rapid atrial fibrillation with positive MB fraction on cardiac enzymes      secondary to above. Spontaneous conversion to normal sinus rhythm status      post cardiac evaluation. Negative catheterization.  3.  Anemia with iron deficiency, likely with chronic disease secondary to      chronic infection. See #1 above. Guaiac negative. Status post 2 units      packed red blood cell transfusion. Negative gastroenterology evaluation.  4.  Thrombocytosis with leukocytosis secondary to #1, downward trend.      Continue outpatient follow-up to ensure resolution.  5.  Chronic migraine and neck pain status post remote neurosurgical      ganglionectomy. Outpatient follow-up with pain clinic currently at      [facility], to be arranged to [facility]. Continue home scheduled      medications.   DISCHARGE MEDICATIONS:  1.  Avelox 400 mg p.o. daily x3 weeks total, prescription provided.  2.  Iron 300 mg p.o. q.h.s.  3.  Aspirin 325 mg p.o. daily.  4.  Toprol-XL 50 mg p.o. q.a.m.   Other medications are as prior to admission and include:  1.  Duragesic patch 50 mcg q.72h.  2.  Methadone 10 mg p.o. t.i.d.  3.  Actiq fentanyl sucker 800 mcg p.r.n. breakthrough pain.  4.  Amitriptyline 100 mg p.o. b.i.d.  5.  Valium 5 mg p.o.  t.i.d.  6.  Dulcolax tablets one to two daily p.r.n.  7.  Fleet's enema p.r.n. q.48h. if no bowel movement.   DISPOSITION:  The patient is discharged home where he lives independently  but has close supervision by a supportive family.   CONDITION ON DISCHARGE:  Medically stable, not hypoxic, tolerating p.o.  nutrition. Medications reviewed and understands plans for follow-up and  ongoing treatment.   HOSPITAL COURSE BY PROBLEM:  #1 - LEFT LOWER LOBE NECROTIZING PNEUMONIA. The  patient is a 65 year old gentleman who came to the emergency room with  shortness of breath and was found to be in rapid atrial fibrillation. Please  see problem below for further details on this diagnosis. Associated with the  ongoing shortness of breath had been months of fevers and night sweats,  anorexia and weight loss, and his pain clinic labs had indicated that he had  an anemia and was to undergo outpatient workup for anemia,  prompted now for  inpatient because of other medical issues. A CT chest, abdomen, and pelvis  to look for occult malignancy or infection revealed changes of necrotizing  pneumonia in the left lower lobe. He was subsequently begun on IV Avelox and  had decline in his white count and thrombocytosis. He had no fevers or  chills, has not been hypoxic, and shortness of breath has resolved with  resolution of rapid atrial fibrillation; please see problem next. At this  time the plan is to continue antibiotics for 3 weeks with Avelox and  outpatient follow-up to ensure resolution of pneumonia, considering  pulmonary evaluation to rule out malignancy if persists despite antibiotic  therapy. Follow-up white count and platelet count with primary M.D. to  ensure ongoing adequate response.   #2 - RAPID ATRIAL FIBRILLATION. This was new and noted the emergency room  with an elevated MB on point of care enzymes. He was given 10 mg of  diltiazem for rate control and subsequently converted into  normal sinus  rhythm where he stayed for the remainder of his hospitalization. He was  begun on IV heparin and because of an elevated MB fraction as well as family  history of coronary disease, was scheduled for catheterization by cardiology  during consult. He underwent his catheterization on August 22 which  fortunately showed no coronary artery disease. Cardiology felt he did not  need Coumadin but continue full-dose aspirin therapy 325 daily as well as  Toprol-XL. No other cardiac workup was recommended. His 2-D echocardiogram  was with normal LVEF and no other abnormalities.   #3 - ANEMIA. As stated above this was to be the patient's primary issue for  outpatient evaluation, which was undertaken during this hospitalization. B12  and folate levels were normal, iron level was low. He was guaiac negative  and GI felt that there was no need for further endoscopic evaluation as  endoscopy 3-4 years ago was negative. Likely related to chronic disease in  the form of pneumonia. As stated above, he was transfused 2 units PRBC  during this transfusion and hemoglobin at time of discharge was 10.6. He  will be continued on iron therapy as an outpatient orally and an outpatient  follow-up level of hemoglobin as well as retic count and iron levels will be  checked by primary M.D., Dr. Alonza Smoker. He has been hemodynamically stable  throughout this admission.   #4 - CHRONIC PAIN. The patient is disabled from his chronic migraines and  neck pain. He takes large doses of narcotics and is frequently constipated  because of this. He is well maintained on a schedule per pain clinic and as  listed above. No changes were made in his regimen.   LABORATORY DATA AT TIME OF DISCHARGE:  White count of 13.1, hemoglobin of  10.6, platelet count of 518. Sodium 143, potassium 5.0, chloride 109, bicarb  28, BUN 4, creatinine 1.1, and glucose of 154.     Rene Paci, M.D. [facility]  Electronically  Signed     VL/MEDQ  D:  09/22/2004  T:  09/22/2004  Job:  [id number]

## 2010-06-18 NOTE — Consult Note (Signed)
NAME:  Michael Bender, Michael Bender NO.:  [id number]   MEDICAL RECORD NO.:  [id number]          PATIENT TYPE:  EMS   LOCATION:  ED                           FACILITY:  [facility]   PHYSICIAN:  Rollene Rotunda, M.D.   DATE OF BIRTH:  1945/10/12   DATE OF CONSULTATION:  09/19/2004  DATE OF DISCHARGE:                                   CONSULTATION   PRIMARY CARE PHYSICIAN:  Dr. Alonza Smoker.   REASON FOR PRESENTATION:  Evaluate patient with shortness of breath,  weakness, and atrial fibrillation.   HISTORY OF PRESENT ILLNESS:  The patient is a very pleasant 65 year old  gentleman without prior cardiac history. He is being worked up with anemia.  He has also had fevers. His family reports since April he gets fevers almost  weekly. These have been 101 to 103 without a clear etiology. His family does  report and the patient reluctantly agrees he has had decreasing exercise  tolerance, increasing weakness, decreasing appetite and weight loss over  this time. He has dyspnea now walking moderate distance on a level ground.   Last night, he got up to go to the bathroom. When he was done, he noticed he  was very weak and had difficulty even getting back to the bed. He was very  short of breath. He did not notice his heart racing. He had no syncope. He  had no chest discomfort, neck discomfort, arm discomfort, activity-induced  nausea and vomiting, excessive diaphoresis. However, he came to the  emergency room and was noted to be rapid atrial fibrillation. He was treated  with IV Cardizem. He eventually converted to sinus rhythm. However, cardiac  point of care markers were elevated. He had no acute changes on either his  atrial fibrillation or followup EKG. We are now called to evaluate.   PAST MEDICAL HISTORY:  1.  Recent diagnosis of anemia (hemoglobin has been 9.4, platelets 494,      white blood cells 9.8, MCV 89.7).  2.  Migraines.  3.  Cervical spine disease.   The patient has no  history of hypertension, diabetes, or hyperlipidemia.   PAST SURGICAL HISTORY:  1.  Rectal prolapse surgery.  2.  Four spine surgeries.  3.  Tonsillectomy.   ALLERGIES:  None.   MEDICATIONS:  1.  Duragesic patch.  2.  Methadone.  3.  Valium 5 mg t.i.d.  4.  Amitriptyline 100 mg b.i.d.   SOCIAL HISTORY:  The patient has two sons. 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 He was a smoker for 40 years of two packs per day. He quit when  his only grandchild was born 2-1/2 years ago.   FAMILY HISTORY:  Contributory for his mother dying of myocardial infarction  at age 51.   REVIEW OF SYSTEMS:  Positive as stated in the HPI. Positive for a recent  fall. He tripped over something. He now has resultant left sided rib pain.   PHYSICAL EXAMINATION:  GENERAL:  The patient is very appropriate. He is in  no distress.  VITAL SIGNS:  Blood pressure 108/75, heart rate  80 and regular, afebrile.  HEENT:  Fundi not visualized secondary to the patient's migraines. Oral  mucosa unremarkable. Poor dentition.  NECK:  No jugular venous distention. Wave form within normal limits. Caroid  upstrokes brisk and symmetric. Soft left carotid bruit. No thyromegaly.  LYMPHATICS:  No cervical, axillary, or inguinal adenopathy.  LUNGS:  Clear to auscultation bilaterally.  BACK:  No costovertebral angle tenderness.  CHEST:  Unremarkable.  HEART:  PMI not displaced or sustained. S1 and S2 within normal limits. No  S3, no S4, no clicks, no rubs, no murmurs.  ABDOMEN:  Obese, positive bowel sounds. Normal in frequency and pitch. No  bruits, no rebound, no guarding ___________ hepatomegaly, splenomegaly.  SKIN:  No rashes. No nodules.  EXTREMITIES:  2+ pulses throughout. No edema, cyanosis, or clubbing.  NEUROLOGICAL:  Oriented to person, place and time. Cranial nerves II-XII  grossly intact. Motor grossly intact.   STUDIES:  EKG:  Sinus rhythm, rate 70, axis within normal limits, intervals  within normal  limits, no acute ST-T wave changes.   Point of care markers:  MB peak 25.1, troponin less than 0.05 x3. WBC 15.3,  hemoglobin 10.3. Potassium 3.8, BUN 6, creatinine 1.2.   Chest x-ray:  No acute disease.   ASSESSMENT/PLAN:  1.  Atrial fibrillation. The patient had atrial fibrillation, the duration      of which was not clear. It may have started at 3 in the morning when he      noticed shortness of breath, but it could have happened before this. He      has converted to sinus rhythm. He will ultimately need anticoagulation      unless there is a contraindication related to his anemia. For now, he      will be started on heparin. I will switch him off the IV Cardizem and      put him on beta blocker for rate control. He will get an echocardiogram.  2.  Elevated MB. I would like to see the patient's total CK to see whether      this ratio is diagnostic. We will continue to cycle enzymes. If his      ratio is consistent with an ischemic event rather than mostly      musculoskeletal and/or his troponins are elevated, he will need cardiac      catheterization. Otherwise, I might defer this and do stress      perfusion imaging. If he has regional wall motion or reduced ejection      fraction on echocardiogram, he would need cardiac catheterization.  3.  Risk reduction. Will obtain a lipid profile and treat appropriately.           ______________________________  Rollene Rotunda, M.D.     JH/MEDQ  D:  09/19/2004  T:  09/20/2004  Job:  [id number]   cc:   Tinnie Gens A. Tawanna Cooler, M.D. [facility]  [address] [place]  [place] [address]

## 2010-06-18 NOTE — Cardiovascular Report (Signed)
NAME:  Michael Bender, Michael Bender NO.:  [id number]   MEDICAL RECORD NO.:  [id number]          PATIENT TYPE:  OUT   LOCATION:  CATH                         FACILITY:  [facility]   PHYSICIAN:  Charlton Haws, M.D.     DATE OF BIRTH:  05/31/1945   DATE OF PROCEDURE:  DATE OF DISCHARGE:                              CARDIAC CATHETERIZATION   INDICATIONS:  Chest pain, positive CPK's.  Question coronary disease.   __________ catheterization with 6 French catheter from the right femoral  artery.   Left main coronary artery well-appearing normal.   Left anterior descending artery was normal.   Circumflex coronary artery was nondominant and normal.   The right coronary artery was dominant and normal.   ___________ VENTRICULOGRAPHY:  Ventriculography was normal.  The EF was 55%.  There was no gradient across the aortic valve and no MR.  Aortic pressure  was 113/67.  LV pressure was 114/10.   IMPRESSION:  The patient has not had significant coronary artery disease.  He has had a normal echo.  I suspect his entire clinical syndrome is from  his necrotizing pneumonia.  He will be transferred back to [facility]  under the care of his primary care physician.           ______________________________  Charlton Haws, M.D.     PN/MEDQ  D:  09/21/2004  T:  09/21/2004  Job:  [id number]

## 2010-06-18 NOTE — Op Note (Signed)
NAME:  Michael Bender, Michael Bender                        ACCOUNT NO.:  [id number]   MEDICAL RECORD NO.:  [id number]                   PATIENT TYPE:  AMB   LOCATION:  DAY                                  FACILITY:  [facility]   PHYSICIAN:  Timothy E. Earlene Plater, M.D.              DATE OF BIRTH:  October 29, 1945   DATE OF PROCEDURE:  11/08/2001  DATE OF DISCHARGE:                                 OPERATIVE REPORT   PREOPERATIVE DIAGNOSES:  Complex internal and external hemorrhoids.   POSTOPERATIVE DIAGNOSES:  Complex internal and external hemorrhoids.   PROCEDURE:  Complex hemorrhoidectomy, PPH stapled anopexy.   SURGEON:  Timothy E. Earlene Plater, M.D.   ASSISTANT:  Ollen Gross. Carolynne Edouard, M.D.   ANESTHESIA:  General.   INDICATIONS FOR PROCEDURE:  Mr. Culbertson is 5, I believe to be a solid and  trustworthy patient but is on chronic narcotics for migraines and spine  pain. He has had multiple surgeries and is now disabled. He tends to be  chronically constipated and has difficulty with bowels, less so now than  previously but he has prolapsing hemorrhoids with mucous discharge, pain,  discomfort and bleeding. A colonoscopy recently has been negative. After  careful discussion and attempt at office management, he has elected to  proceed with surgical hemorrhoidectomy. This has been carefully discussed  with the patient. Various options also discussed.   DESCRIPTION OF PROCEDURE:  The patient was seen and evaluated by anesthesia  in the preop area, identified and the permit signed.   He was taken to the operating room, placed supine, LMA anesthesia provided.  He was placed in extended lithotomy position and the perineum prepped and  draped in the usual fashion. A prominent posterior internal and external  complex was present, the external complex going to the anoderm only. The  skin was not involved. A large right posterior internal hemorrhoid was  present then, the first time a right anterior hemorrhoid was seen. The  patient exhibited 360 degree prolapse of these hemorrhoids. Because of that,  a PPH anopexy complex hemorrhoidectomy was entertained.   The anus was anesthetized with 0.25% Marcaine with epinephrine mixed 9:1  with Wydase. This was massaged in well. The anus was dilated. The PPH  introducer was placed, careful visualization of the 360 degree prolapse was  ascertained. Then using the operating anoscope, a pursestring suture of 2-0  Prolene was placed 4 cm from the dentate line around and about the entire  circumference of the rectum involving the mucosa and submucosa only. This  was satisfactory at its completion. The PPH stapling device was opened  fully. The anvil of this device was placed above the suture line. The suture  line was then tied around the post of the staple device and then the ends of  the sutures were passed through the base of the PPH staple device. Then  after careful evaluation, the PPH staple device was gently closed  to the  tight position and held for 45 seconds. The staple device was then fired and  held in the hard fire position for 45 seconds. The staple device and  introducer were then removed. We carefully evaluated the staple line, one  suture of 3-0 Vicryl was placed in the posterior position. We then waited  five minutes and reexamined the suture line again and found it to be secure  and hemostatic. The staple line measured was at 2.5 cm proximal to the  dentate line. There were no complications. A Gelfoam pack was placed in the  rectal canal. He tolerated it well and was awakened and taken to the  recovery room in good condition.   Written and verbal instructions were given to him and his family and he will  be seen and followed as an outpatient.                                               Timothy E. Earlene Plater, M.D.    TED/MEDQ  D:  11/08/2001  T:  11/08/2001  Job:  [id number]

## 2010-06-22 ENCOUNTER — Emergency Department (HOSPITAL_COMMUNITY)
Admission: EM | Admit: 2010-06-22 | Discharge: 2010-06-22 | Disposition: A | Discharge status: Home / Self Care | Payer: Medicare Other | Attending: Emergency Medicine | Admitting: Emergency Medicine

## 2010-06-22 ENCOUNTER — Telehealth: Payer: Self-pay | Admitting: *Deleted

## 2010-06-22 DIAGNOSIS — R110 Nausea: Secondary | ICD-10-CM | POA: Insufficient documentation

## 2010-06-22 DIAGNOSIS — G43909 Migraine, unspecified, not intractable, without status migrainosus: Secondary | ICD-10-CM | POA: Insufficient documentation

## 2010-06-22 DIAGNOSIS — H53149 Visual discomfort, unspecified: Secondary | ICD-10-CM | POA: Insufficient documentation

## 2010-06-22 NOTE — Telephone Encounter (Signed)
Pt is asking for a Morphine injection for his migraine, and is asking which hospital to go?  Advised to go to either one or to go where he has been before and they have his records.

## 2010-07-15 ENCOUNTER — Other Ambulatory Visit: Payer: Self-pay | Admitting: Family Medicine

## 2010-07-21 ENCOUNTER — Other Ambulatory Visit: Payer: Self-pay | Admitting: Neurology

## 2010-07-21 DIAGNOSIS — M545 Low back pain, unspecified: Secondary | ICD-10-CM

## 2010-07-21 DIAGNOSIS — R269 Unspecified abnormalities of gait and mobility: Secondary | ICD-10-CM

## 2010-07-24 ENCOUNTER — Ambulatory Visit
Admission: RE | Admit: 2010-07-24 | Discharge: 2010-07-24 | Disposition: A | Discharge status: Home / Self Care | Payer: Medicare Other | Source: Ambulatory Visit | Attending: Neurology | Admitting: Neurology

## 2010-07-24 DIAGNOSIS — M545 Low back pain, unspecified: Secondary | ICD-10-CM

## 2010-07-24 DIAGNOSIS — R269 Unspecified abnormalities of gait and mobility: Secondary | ICD-10-CM

## 2010-10-18 ENCOUNTER — Other Ambulatory Visit: Payer: Self-pay | Admitting: Internal Medicine

## 2010-10-18 NOTE — Telephone Encounter (Signed)
Dr Todd pt. 

## 2010-12-13 ENCOUNTER — Other Ambulatory Visit: Payer: Self-pay | Admitting: Family Medicine

## 2011-01-03 ENCOUNTER — Telehealth: Payer: Self-pay

## 2011-01-03 DIAGNOSIS — M79605 Pain in left leg: Secondary | ICD-10-CM

## 2011-01-03 DIAGNOSIS — R6889 Other general symptoms and signs: Secondary | ICD-10-CM

## 2011-01-03 DIAGNOSIS — M5137 Other intervertebral disc degeneration, lumbosacral region: Secondary | ICD-10-CM

## 2011-01-03 DIAGNOSIS — M79604 Pain in right leg: Secondary | ICD-10-CM

## 2011-01-03 NOTE — Telephone Encounter (Signed)
Pt would like a referral to a neurologist for severe back pain. Pt would like to get a 3rd opinion.  Pt would like a referral to Dr. Venetia Maxon (fax # [phone].  Pls advise.

## 2011-01-03 NOTE — Telephone Encounter (Signed)
Referral sent 

## 2011-01-03 NOTE — Telephone Encounter (Signed)
ok 

## 2011-01-03 NOTE — Telephone Encounter (Signed)
Pt called and stated he needs a referral.  Called pt to get more information. No answer.

## 2011-01-05 ENCOUNTER — Other Ambulatory Visit: Payer: Self-pay | Admitting: Family Medicine

## 2011-01-05 DIAGNOSIS — M5137 Other intervertebral disc degeneration, lumbosacral region: Secondary | ICD-10-CM

## 2011-01-05 DIAGNOSIS — M79605 Pain in left leg: Secondary | ICD-10-CM

## 2011-01-05 DIAGNOSIS — M79604 Pain in right leg: Secondary | ICD-10-CM

## 2011-01-06 ENCOUNTER — Ambulatory Visit: Payer: Medicare Other | Admitting: Family Medicine

## 2011-02-15 ENCOUNTER — Other Ambulatory Visit: Payer: Self-pay | Admitting: Neurosurgery

## 2011-02-15 DIAGNOSIS — M7130 Other bursal cyst, unspecified site: Secondary | ICD-10-CM

## 2011-02-20 ENCOUNTER — Ambulatory Visit
Admission: RE | Admit: 2011-02-20 | Discharge: 2011-02-20 | Disposition: A | Discharge status: Home / Self Care | Payer: Medicare Other | Source: Ambulatory Visit | Attending: Neurosurgery | Admitting: Neurosurgery

## 2011-02-20 DIAGNOSIS — M7130 Other bursal cyst, unspecified site: Secondary | ICD-10-CM

## 2011-03-01 ENCOUNTER — Other Ambulatory Visit: Payer: Self-pay | Admitting: Neurosurgery

## 2011-03-01 ENCOUNTER — Encounter (HOSPITAL_COMMUNITY): Payer: Self-pay | Admitting: Pharmacy Technician

## 2011-03-04 ENCOUNTER — Other Ambulatory Visit (HOSPITAL_COMMUNITY): Payer: Self-pay | Admitting: *Deleted

## 2011-03-04 ENCOUNTER — Ambulatory Visit (HOSPITAL_COMMUNITY)
Admission: RE | Admit: 2011-03-04 | Discharge: 2011-03-04 | Disposition: A | Discharge status: Home / Self Care | Payer: Medicare Other | Source: Ambulatory Visit | Attending: Anesthesiology | Admitting: Anesthesiology

## 2011-03-04 ENCOUNTER — Other Ambulatory Visit: Payer: Self-pay

## 2011-03-04 ENCOUNTER — Encounter (HOSPITAL_COMMUNITY)
Admission: RE | Admit: 2011-03-04 | Discharge: 2011-03-04 | Disposition: A | Discharge status: Home / Self Care | Payer: Medicare Other | Source: Ambulatory Visit | Attending: Neurosurgery | Admitting: Neurosurgery

## 2011-03-04 ENCOUNTER — Encounter (HOSPITAL_COMMUNITY): Payer: Self-pay

## 2011-03-04 DIAGNOSIS — Z01818 Encounter for other preprocedural examination: Secondary | ICD-10-CM | POA: Insufficient documentation

## 2011-03-04 DIAGNOSIS — J189 Pneumonia, unspecified organism: Secondary | ICD-10-CM | POA: Insufficient documentation

## 2011-03-04 DIAGNOSIS — F17200 Nicotine dependence, unspecified, uncomplicated: Secondary | ICD-10-CM | POA: Insufficient documentation

## 2011-03-04 DIAGNOSIS — Z01812 Encounter for preprocedural laboratory examination: Secondary | ICD-10-CM | POA: Insufficient documentation

## 2011-03-04 DIAGNOSIS — Z01810 Encounter for preprocedural cardiovascular examination: Secondary | ICD-10-CM | POA: Insufficient documentation

## 2011-03-04 DIAGNOSIS — I10 Essential (primary) hypertension: Secondary | ICD-10-CM | POA: Insufficient documentation

## 2011-03-04 HISTORY — DX: Disorder of prostate, unspecified: N42.9

## 2011-03-04 HISTORY — DX: Nocturia: R35.1

## 2011-03-04 HISTORY — DX: Pneumonia, unspecified organism: J18.9

## 2011-03-04 HISTORY — DX: Headache: R51

## 2011-03-04 HISTORY — DX: Reserved for inherently not codable concepts without codable children: IMO0001

## 2011-03-04 HISTORY — DX: Other hemorrhoids: K64.8

## 2011-03-04 HISTORY — DX: Unspecified osteoarthritis, unspecified site: M19.90

## 2011-03-04 HISTORY — DX: Essential (primary) hypertension: I10

## 2011-03-04 HISTORY — DX: Torticollis: M43.6

## 2011-03-04 HISTORY — DX: Encounter for other specified aftercare: Z51.89

## 2011-03-04 LAB — TYPE AND SCREEN
ABO/RH(D): O POS
Antibody Screen: NEGATIVE

## 2011-03-04 LAB — SURGICAL PCR SCREEN
MRSA, PCR: NEGATIVE
Staphylococcus aureus: NEGATIVE

## 2011-03-04 LAB — CBC
HCT: 38.4 % — ABNORMAL LOW (ref 39.0–52.0)
Hemoglobin: 13.7 g/dL (ref 13.0–17.0)
MCH: 31.6 pg (ref 26.0–34.0)
MCHC: 35.7 g/dL (ref 30.0–36.0)
MCV: 88.5 fL (ref 78.0–100.0)
Platelets: 224 10*3/uL (ref 150–400)
RBC: 4.34 MIL/uL (ref 4.22–5.81)
RDW: 12.4 % (ref 11.5–15.5)
WBC: 5.5 10*3/uL (ref 4.0–10.5)

## 2011-03-04 LAB — BASIC METABOLIC PANEL
BUN: 7 mg/dL (ref 6–23)
CO2: 31 mEq/L (ref 19–32)
Calcium: 9.6 mg/dL (ref 8.4–10.5)
Chloride: 100 mEq/L (ref 96–112)
Creatinine, Ser: 0.94 mg/dL (ref 0.50–1.35)
GFR calc Af Amer: >90 mL/min (ref >90)
GFR calc non Af Amer: 85 mL/min — ABNORMAL LOW (ref >90)
Glucose, Bld: 94 mg/dL (ref 70–99)
Potassium: 3.6 mEq/L (ref 3.5–5.1)
Sodium: 137 mEq/L (ref 135–145)

## 2011-03-04 LAB — ABO/RH: ABO/RH(D): O POS

## 2011-03-04 NOTE — Progress Notes (Signed)
During interview, when asked about feelings of helpless and hopeless, pt stated that the pain has been so intense that he has felt that way.  Pt states he cries a lot, but if someone ask, he says that nothing is wrong and that he doesn't know why he is crying.  Pt states that he has felt like suicide in the past ,but does not feel that way now.  "I am looking forward to feeling better"  "But I understand that I have to lay around for a while and I just quit smoking, so I hope the surgery is worth it."  Pt states that he has been on Elavil for along time. I asked pt if he thought maybe he should see his PCP- pt said "probably " . I asked pt if he would call and make an appt. And he said he probably would not.  I asked him if he would go to an appointment if I called for him and he said "yes."  I spoke with scheduler at Dr Nelida Meuse office and explained the situation and surgery is scheduled for Fri, Feb 8th.  Pt was given an appt for Mon. Feb 4th.  Pt said he would go and appreciated my calling for him.

## 2011-03-04 NOTE — Pre-Procedure Instructions (Signed)
20 Michael Bender  03/04/2011   Your procedure is scheduled on:  Friday, February 8th.  Report to [facility] at 11:00 AM.   Call this number if you have problems the morning of surgery: [phone]   Remember:   Do not eat food:After Midnight.  May have clear liquids: up to 4 Hours before arrival.  7:00  Clear liquids include soda, tea, black coffee, apple or grape juice, broth.    Take these medicines the morning of surgery with A SIP OF WATER: Esomeprazole, Amitriptyline, Diazepam, Oxycodone, Cylobenzaprine   Do not wear jewelry, make-up or nail polish.  Do not wear lotions, powders, or perfumes. You may wear deodorant.  Do not shave 48 hours prior to surgery.  Do not bring valuables to the hospital.  Contacts, dentures or bridgework may not be worn into surgery.  Leave suitcase in the car. After surgery it may be brought to your room.  For patients admitted to the hospital, checkout time is 11:00 AM the day of discharge.   Patients discharged the day of surgery will not be allowed to drive home.  Name and phone number of your driver: NA   Special Instructions: CHG Shower Use Special Wash: 1/2 bottle night before surgery and 1/2 bottle morning of surgery.   Please read over the following fact sheets that you were given: Pain Booklet, Coughing and Deep Breathing, Blood Transfusion Information, MRSA Information and Surgical Site Infection Prevention

## 2011-03-07 ENCOUNTER — Ambulatory Visit: Payer: Medicare Other | Admitting: Family Medicine

## 2011-03-10 MED ORDER — CEFAZOLIN SODIUM-DEXTROSE 2-3 GM-% IV SOLR
2.0000 g | INTRAVENOUS | Status: AC
  Administered 2011-03-11: 2 g via INTRAVENOUS
  Filled 2011-03-10: qty 50

## 2011-03-10 NOTE — H&P (Signed)
Michael Bender. Roudebush  #[id number] DOB:  [birth year] 02/28/2011:     Michael Bender comes back today to review his MRI of his lumbar spine which shows that he has had severe progression of a large synovial cyst on the left at the L4-5 level which is causing near complete obliteration of the spinal canal.  He also has significant lateral recess stenosis at the L4-5 level on the right and facet arthropathy at this level.  He says that his right leg pain is about 50% of his left leg pain.  He had not done so yet.  He has some mild retrolisthesis of L2 on L3 and L3 on L4.   We talked about treatment options at this point.  Due to the severity of the nerve root compression as well as the marked degenerative changes in the lumbar spine, I have suggested that we go ahead with decompression and fusion at the L4-5 level with resection of the synovial cyst.  He wishes to do so.  We will plan on doing this in the relatively near future.  He was advised of the importance of stopping smoking and said he will do so before proceeding with surgery.  Risks and benefits were discussed.  He wishes to proceed.  He will be fitted with an LSO brace.    He was given a prescription for pain medication.            Michael Bender. Michael Bender, M.D./gde  cc: Dr. Kelle Darting   NEUROSURGICAL CONSULTATION  Michael Bender. Bender  #[id number]  DOB:  May 03, 1945    February 14, 2011   HISTORY OF PRESENT ILLNESS:  Michael Bender is a 66 year old, disabled man with low back pain who presents at the request of Dr. Tawanna Cooler for neurosurgical consultation with the chief complaint of low back and left greater than right lower extremity pain.  He notes numbness in the left foot.  He says he fell and fractured a lower rib on the right.  He has been to a chiropractor for his neck and it helped him with his neck, but has not been able to give him any relief of his low back.  He has a history of hypertension and migraine headaches.  He taking Flexeril 10 mg 3 x daily,  Oxycodone 15 mg 4 x daily, Valium 15 mg at night, Amitriptyline 100 mg twice daily, Neurontin 300 mg 3 x daily.  He has had four prior surgeries all by Dr. Gasper Sells which he says did not give him any relief and he now is complaining of low back pain.  He has been going through pain management and Dr. Hetty Ely recommended spinal cord stimulator.    REVIEW OF SYSTEMS:   Review of Systems was reviewed with the patient.  Pertinent positives include wears glasses, hearing loss, ringing in ears, high blood pressure, leg pain while walking, change in bowel habits, leg weakness, back pain, leg pain, neck pain, depression.    PAST MEDICAL HISTORY:      Current Medical Conditions:  As previously described.      Prior Operations and Hospitalizations:  As previously described.      Medications and Allergies:  Cyclobenzaprine 10 mg tid, Oxycodone 15 mg qid, Diazepam 5 mg tid, Amitriptyline 100 mg bid, Nexium 40 mg qd, Atenolol  tab qd, Miralax 1 capful qd and Gabapentin 300 mg tid.  HE IS ALLERGIC TO NURANE(?sp).      Height and Weight:  He is 5', 11" tall and 184 lbs.  FAMILY HISTORY:    Mother died at age 29 of a heart attack.  Father died at age 71 of cancer.    SOCIAL HISTORY:    He is a 1/3 pack per day smoker of cigarettes.  He denies alcohol or drug use.    PHYSICAL EXAMINATION:      General Appearance:  Michael Bender is a pleasant, cooperative man in no acute distress.      Blood Pressure, Pulse and  Respiratory Rate:  Blood pressure is 122/70.  Heart rate is 80 and regular.  Respiratory rate is 16.        HEENT - normocephalic, atraumatic.  The pupils are equal, round and reactive to light.  The extraocular muscles are intact.  Sclerae - white.  Conjunctiva - pink.  Oropharynx benign.  Uvula midline.     Neck - there are no masses, meningismus, deformities, tracheal deviation, jugular vein distention or carotid bruits.  There is normal cervical range of motion.  Spurlings' test is negative  without reproducible radicular pain turning the patient's head to either side.  Lhermitte's sign is not present with axial compression.      Respiratory - there is normal respiratory effort with good intercostal function.  Lungs are clear to auscultation.  There are no rales, rhonchi or wheezes.      Cardiovascular - the heart has regular rate and rhythm to auscultation.  No murmurs are appreciated.  There is no extremity edema, cyanosis or clubbing.  There are palpable pedal pulses.      Abdomen - soft, nontender, no hepatosplenomegaly appreciated or masses.  There are active bowel sounds.  No guarding or rebound.      Musculoskeletal Examination - he has an antalgic gait favoring his left lower extremity.  He has left sciatic notch discomfort.  He is able to bend to within 18 inches of the floor with his arms extended.  He is able to stand on his heels and toes.   NEUROLOGICAL EXAMINATION: The patient is oriented to time, person and place and has good recall of both recent and remote memory with normal attention span and concentration.  The patient speaks with clear and fluent speech and exhibits normal language function and appropriate fund of knowledge.      Cranial Nerve Examination - pupils are equal, round and reactive to light.  Extraocular movements are full.  Visual fields are full to confrontational testing.  Facial sensation and facial movement are symmetric and intact.  Hearing is intact to finger rub.  Palate is upgoing.  Shoulder shrug is symmetric.  Tongue protrudes in the midline.      Motor Examination - motor strength is 5/5 in the bilateral deltoids, biceps, triceps, handgrips, wrist extensors, interosseous.  In the lower extremities motor strength is 5/5 with the   exception 4+/5 right EHL strength and hip abductor strength and 4/5 left EHL strength and hip abductor strength.        Sensory Examination - he has decreased pin sensation in the left L5 distribution.      Deep  Tendon Reflexes - 2 in the biceps, triceps and brachioradialis, 2 at the knees, 2 at the ankles and great toes are downgoing to plantar stimulation.      Cerebellar Examination - normal coordination in upper and lower extremities and normal rapid alternating movements.  Romberg test is negative.    IMPRESSION AND RECOMMENDATIONS:   Plain radiographs were reviewed which demonstrate multilevel spondylosis and L4-5 facet arthropathy.  There appears to be a cyst off of the L4-5 facet joint based on an MRI of his lumbar spine which was performed in July of 2012 and this appears to be consistent with his pain complaints.  However, he was not have right-sided symptoms at that time and is now having right-sided symptoms and weakness as well.  Because of this, I have recommended we go ahead with a new MRI of his lumbar spine to characterize if there is progression of the synovial cyst or worsening of the spinal stenosis with the possibility of right-sided cyst formation, so that I can make specific surgical recommendations as I do think he will require surgical intervention.  I will make further recommendations after he has the MRI done and returns to see me.    [facility]    Michael Bender. Michael Bender, M.D.  JDS:sv cc: Dr. Kelle Darting

## 2011-03-11 ENCOUNTER — Encounter (HOSPITAL_COMMUNITY): Payer: Self-pay | Admitting: *Deleted

## 2011-03-11 ENCOUNTER — Encounter (HOSPITAL_COMMUNITY)
Admission: RE | Disposition: A | Discharge status: Skilled Nursing Facility | Payer: Self-pay | Source: Ambulatory Visit | Attending: Neurosurgery

## 2011-03-11 ENCOUNTER — Inpatient Hospital Stay (HOSPITAL_COMMUNITY): Payer: Medicare Other

## 2011-03-11 ENCOUNTER — Inpatient Hospital Stay (HOSPITAL_COMMUNITY): Payer: Medicare Other | Admitting: Anesthesiology

## 2011-03-11 ENCOUNTER — Inpatient Hospital Stay (HOSPITAL_COMMUNITY)
Admission: RE | Admit: 2011-03-11 | Discharge: 2011-03-16 | DRG: 460 | Disposition: A | Discharge status: Skilled Nursing Facility | Payer: Medicare Other | Source: Ambulatory Visit | Attending: Neurosurgery | Admitting: Neurosurgery

## 2011-03-11 ENCOUNTER — Encounter (HOSPITAL_COMMUNITY): Payer: Self-pay | Admitting: Anesthesiology

## 2011-03-11 DIAGNOSIS — F17200 Nicotine dependence, unspecified, uncomplicated: Secondary | ICD-10-CM | POA: Diagnosis present

## 2011-03-11 DIAGNOSIS — Q762 Congenital spondylolisthesis: Secondary | ICD-10-CM

## 2011-03-11 DIAGNOSIS — Z79899 Other long term (current) drug therapy: Secondary | ICD-10-CM

## 2011-03-11 DIAGNOSIS — M7130 Other bursal cyst, unspecified site: Secondary | ICD-10-CM | POA: Diagnosis present

## 2011-03-11 DIAGNOSIS — M47817 Spondylosis without myelopathy or radiculopathy, lumbosacral region: Principal | ICD-10-CM | POA: Diagnosis present

## 2011-03-11 SURGERY — POSTERIOR LUMBAR FUSION 1 LEVEL
Anesthesia: General | Site: Back | Wound class: Clean

## 2011-03-11 MED ORDER — DIAZEPAM 5 MG PO TABS
5.0000 mg | ORAL_TABLET | Freq: Four times a day (QID) | ORAL | Status: DC | PRN
  Administered 2011-03-12 – 2011-03-16 (×6): 5 mg via ORAL
  Filled 2011-03-11 (×6): qty 1

## 2011-03-11 MED ORDER — GLYCOPYRROLATE 0.2 MG/ML IJ SOLN
INTRAMUSCULAR | Status: DC | PRN
  Administered 2011-03-11: 1 mg via INTRAVENOUS

## 2011-03-11 MED ORDER — OXYCODONE-ACETAMINOPHEN 5-325 MG PO TABS
ORAL_TABLET | ORAL | Status: AC
  Filled 2011-03-11: qty 2

## 2011-03-11 MED ORDER — ACETAMINOPHEN 325 MG PO TABS: 650.0000 mg | ORAL_TABLET | ORAL | Status: DC | PRN

## 2011-03-11 MED ORDER — HYDROMORPHONE 0.3 MG/ML IV SOLN
INTRAVENOUS | Status: AC
  Filled 2011-03-11: qty 25

## 2011-03-11 MED ORDER — KCL IN DEXTROSE-NACL 20-5-0.45 MEQ/L-%-% IV SOLN
INTRAVENOUS | Status: DC
  Administered 2011-03-11 – 2011-03-13 (×2): via INTRAVENOUS
  Filled 2011-03-11 (×10): qty 1000

## 2011-03-11 MED ORDER — HYDROMORPHONE HCL PF 1 MG/ML IJ SOLN
INTRAMUSCULAR | Status: AC
  Filled 2011-03-11: qty 1

## 2011-03-11 MED ORDER — DIAZEPAM 5 MG/ML IJ SOLN
5.0000 mg | INTRAMUSCULAR | Status: AC
  Administered 2011-03-11 (×2): 5 mg via INTRAVENOUS

## 2011-03-11 MED ORDER — CEFAZOLIN SODIUM 1-5 GM-% IV SOLN
1.0000 g | Freq: Three times a day (TID) | INTRAVENOUS | Status: AC
  Administered 2011-03-11 – 2011-03-12 (×2): 1 g via INTRAVENOUS
  Filled 2011-03-11 (×2): qty 50

## 2011-03-11 MED ORDER — ATENOLOL-CHLORTHALIDONE 50-25 MG PO TABS: 0.5000 | ORAL_TABLET | ORAL | Status: DC

## 2011-03-11 MED ORDER — MEPERIDINE HCL 25 MG/ML IJ SOLN: 6.2500 mg | INTRAMUSCULAR | Status: DC | PRN

## 2011-03-11 MED ORDER — PHENOL 1.4 % MT LIQD: 1.0000 | OROMUCOSAL | Status: DC | PRN

## 2011-03-11 MED ORDER — MIDAZOLAM HCL 5 MG/5ML IJ SOLN
INTRAMUSCULAR | Status: DC | PRN
  Administered 2011-03-11: 2 mg via INTRAVENOUS

## 2011-03-11 MED ORDER — LACTATED RINGERS IV SOLN
INTRAVENOUS | Status: DC | PRN
  Administered 2011-03-11 (×3): via INTRAVENOUS

## 2011-03-11 MED ORDER — CYCLOBENZAPRINE HCL 10 MG PO TABS
10.0000 mg | ORAL_TABLET | Freq: Three times a day (TID) | ORAL | Status: DC | PRN
  Administered 2011-03-11 – 2011-03-14 (×3): 10 mg via ORAL
  Filled 2011-03-11 (×3): qty 1

## 2011-03-11 MED ORDER — PROPOFOL 10 MG/ML IV EMUL
INTRAVENOUS | Status: DC | PRN
  Administered 2011-03-11: 170 mg via INTRAVENOUS

## 2011-03-11 MED ORDER — SODIUM CHLORIDE 0.9 % IR SOLN
Status: DC | PRN
  Administered 2011-03-11: 15:00:00

## 2011-03-11 MED ORDER — SODIUM CHLORIDE 0.9 % IJ SOLN
3.0000 mL | Freq: Two times a day (BID) | INTRAMUSCULAR | Status: DC
  Administered 2011-03-11 – 2011-03-16 (×6): 3 mL via INTRAVENOUS

## 2011-03-11 MED ORDER — HYDROMORPHONE HCL PF 1 MG/ML IJ SOLN
0.5000 mg | INTRAMUSCULAR | Status: DC | PRN
  Administered 2011-03-11 (×3): 0.5 mg via INTRAVENOUS

## 2011-03-11 MED ORDER — DIAZEPAM 5 MG PO TABS: 5.0000 mg | ORAL_TABLET | Freq: Three times a day (TID) | ORAL | Status: DC | PRN

## 2011-03-11 MED ORDER — BUPIVACAINE HCL (PF) 0.5 % IJ SOLN
INTRAMUSCULAR | Status: DC | PRN
  Administered 2011-03-11: 5 mL

## 2011-03-11 MED ORDER — DIPHENHYDRAMINE HCL 50 MG/ML IJ SOLN: 12.5000 mg | Freq: Four times a day (QID) | INTRAMUSCULAR | Status: DC | PRN

## 2011-03-11 MED ORDER — SODIUM CHLORIDE 0.9 % IV SOLN: 250.0000 mL | INTRAVENOUS | Status: DC

## 2011-03-11 MED ORDER — OXYCODONE-ACETAMINOPHEN 5-325 MG PO TABS
1.0000 | ORAL_TABLET | ORAL | Status: DC | PRN
  Administered 2011-03-11 – 2011-03-16 (×13): 2 via ORAL
  Filled 2011-03-11 (×12): qty 2

## 2011-03-11 MED ORDER — ZOLPIDEM TARTRATE 10 MG PO TABS
10.0000 mg | ORAL_TABLET | Freq: Every day | ORAL | Status: DC
  Administered 2011-03-11 – 2011-03-15 (×5): 10 mg via ORAL
  Filled 2011-03-11 (×2): qty 1

## 2011-03-11 MED ORDER — GABAPENTIN 300 MG PO CAPS
300.0000 mg | ORAL_CAPSULE | Freq: Three times a day (TID) | ORAL | Status: DC
  Administered 2011-03-11 – 2011-03-16 (×14): 300 mg via ORAL
  Filled 2011-03-11 (×16): qty 1

## 2011-03-11 MED ORDER — FENTANYL CITRATE 0.05 MG/ML IJ SOLN: 25.0000 ug | INTRAMUSCULAR | Status: DC | PRN

## 2011-03-11 MED ORDER — 0.9 % SODIUM CHLORIDE (POUR BTL) OPTIME
TOPICAL | Status: DC | PRN
  Administered 2011-03-11: 1000 mL

## 2011-03-11 MED ORDER — POLYETHYLENE GLYCOL 3350 17 G PO PACK
17.0000 g | PACK | Freq: Every day | ORAL | Status: DC
  Administered 2011-03-12 – 2011-03-16 (×5): 17 g via ORAL
  Filled 2011-03-11 (×5): qty 1

## 2011-03-11 MED ORDER — NEOSTIGMINE METHYLSULFATE 1 MG/ML IJ SOLN
INTRAMUSCULAR | Status: DC | PRN
  Administered 2011-03-11: 5 mg via INTRAVENOUS

## 2011-03-11 MED ORDER — PANTOPRAZOLE SODIUM 40 MG IV SOLR
40.0000 mg | Freq: Every day | INTRAVENOUS | Status: DC
  Administered 2011-03-11 – 2011-03-12 (×2): 40 mg via INTRAVENOUS
  Filled 2011-03-11 (×3): qty 40

## 2011-03-11 MED ORDER — NALOXONE HCL 0.4 MG/ML IJ SOLN: 0.4000 mg | INTRAMUSCULAR | Status: DC | PRN

## 2011-03-11 MED ORDER — CHLORTHALIDONE 25 MG PO TABS
12.5000 mg | ORAL_TABLET | Freq: Every day | ORAL | Status: DC
  Administered 2011-03-12: 10:00:00 via ORAL
  Administered 2011-03-13 – 2011-03-16 (×3): 12.5 mg via ORAL
  Filled 2011-03-11 (×5): qty 0.5

## 2011-03-11 MED ORDER — PROMETHAZINE HCL 25 MG/ML IJ SOLN: 6.2500 mg | INTRAMUSCULAR | Status: DC | PRN

## 2011-03-11 MED ORDER — ONDANSETRON HCL 4 MG/2ML IJ SOLN: 4.0000 mg | Freq: Four times a day (QID) | INTRAMUSCULAR | Status: DC | PRN

## 2011-03-11 MED ORDER — ALUM & MAG HYDROXIDE-SIMETH 200-200-20 MG/5ML PO SUSP: 30.0000 mL | Freq: Four times a day (QID) | ORAL | Status: DC | PRN

## 2011-03-11 MED ORDER — DIAZEPAM 5 MG/ML IJ SOLN
INTRAMUSCULAR | Status: AC
  Filled 2011-03-11: qty 2

## 2011-03-11 MED ORDER — EPHEDRINE SULFATE 50 MG/ML IJ SOLN
INTRAMUSCULAR | Status: DC | PRN
  Administered 2011-03-11 (×3): 5 mg via INTRAVENOUS

## 2011-03-11 MED ORDER — ATENOLOL 25 MG PO TABS
25.0000 mg | ORAL_TABLET | Freq: Every day | ORAL | Status: DC
  Administered 2011-03-12 – 2011-03-16 (×4): 25 mg via ORAL
  Filled 2011-03-11 (×5): qty 1

## 2011-03-11 MED ORDER — ROCURONIUM BROMIDE 100 MG/10ML IV SOLN
INTRAVENOUS | Status: DC | PRN
  Administered 2011-03-11: 50 mg via INTRAVENOUS

## 2011-03-11 MED ORDER — PANTOPRAZOLE SODIUM 40 MG PO TBEC: 40.0000 mg | DELAYED_RELEASE_TABLET | Freq: Every day | ORAL | Status: DC

## 2011-03-11 MED ORDER — OXYCODONE HCL 5 MG PO TABS
15.0000 mg | ORAL_TABLET | Freq: Four times a day (QID) | ORAL | Status: DC | PRN
  Administered 2011-03-12 – 2011-03-16 (×12): 15 mg via ORAL
  Filled 2011-03-11 (×12): qty 3

## 2011-03-11 MED ORDER — SODIUM CHLORIDE 0.9 % IJ SOLN: 3.0000 mL | INTRAMUSCULAR | Status: DC | PRN

## 2011-03-11 MED ORDER — OXYCODONE HCL 5 MG PO TABS
15.0000 mg | ORAL_TABLET | Freq: Once | ORAL | Status: AC
  Administered 2011-03-11: 15 mg via ORAL
  Filled 2011-03-11: qty 3

## 2011-03-11 MED ORDER — ONDANSETRON HCL 4 MG/2ML IJ SOLN
INTRAMUSCULAR | Status: DC | PRN
  Administered 2011-03-11: 4 mg via INTRAVENOUS

## 2011-03-11 MED ORDER — HYDROMORPHONE 0.3 MG/ML IV SOLN
INTRAVENOUS | Status: DC
  Administered 2011-03-11: 19:00:00 via INTRAVENOUS
  Administered 2011-03-11: 2.2 mg via INTRAVENOUS
  Administered 2011-03-12: 3 mg via INTRAVENOUS
  Administered 2011-03-12: 14:00:00 via INTRAVENOUS
  Administered 2011-03-12: 0.3 mg via INTRAVENOUS
  Administered 2011-03-12: 3.9 mg via INTRAVENOUS
  Administered 2011-03-12: 2.4 mg via INTRAVENOUS
  Administered 2011-03-12: 2.7 mg via INTRAVENOUS
  Administered 2011-03-12: 05:00:00 via INTRAVENOUS
  Administered 2011-03-13 (×2): 0.6 mg via INTRAVENOUS
  Administered 2011-03-13: 12:00:00 via INTRAVENOUS
  Administered 2011-03-13: 2.1 mg via INTRAVENOUS
  Administered 2011-03-14: 0.6 mg via INTRAVENOUS
  Administered 2011-03-14: 0.479 mg via INTRAVENOUS
  Administered 2011-03-14: 2.7 mg via INTRAVENOUS

## 2011-03-11 MED ORDER — AMITRIPTYLINE HCL 100 MG PO TABS
100.0000 mg | ORAL_TABLET | Freq: Two times a day (BID) | ORAL | Status: DC
  Administered 2011-03-11 – 2011-03-16 (×10): 100 mg via ORAL
  Filled 2011-03-11 (×11): qty 1

## 2011-03-11 MED ORDER — LIDOCAINE-EPINEPHRINE 1 %-1:100000 IJ SOLN
INTRAMUSCULAR | Status: DC | PRN
  Administered 2011-03-11: 5 mL

## 2011-03-11 MED ORDER — SODIUM CHLORIDE 0.9 % IV SOLN
INTRAVENOUS | Status: AC
  Filled 2011-03-11: qty 500

## 2011-03-11 MED ORDER — DIPHENHYDRAMINE HCL 12.5 MG/5ML PO ELIX: 12.5000 mg | ORAL_SOLUTION | Freq: Four times a day (QID) | ORAL | Status: DC | PRN

## 2011-03-11 MED ORDER — LACTATED RINGERS IV SOLN: INTRAVENOUS | Status: DC

## 2011-03-11 MED ORDER — ACETAMINOPHEN 650 MG RE SUPP: 650.0000 mg | RECTAL | Status: DC | PRN

## 2011-03-11 MED ORDER — THROMBIN 20000 UNITS EX KIT
PACK | CUTANEOUS | Status: DC | PRN
  Administered 2011-03-11: 15:00:00 via TOPICAL

## 2011-03-11 MED ORDER — BACITRACIN 50000 UNITS IM SOLR
INTRAMUSCULAR | Status: AC
  Filled 2011-03-11: qty 1

## 2011-03-11 MED ORDER — ZOLPIDEM TARTRATE 10 MG PO TABS
10.0000 mg | ORAL_TABLET | Freq: Every evening | ORAL | Status: DC | PRN
  Filled 2011-03-11 (×2): qty 1

## 2011-03-11 MED ORDER — ONDANSETRON HCL 4 MG/2ML IJ SOLN: 4.0000 mg | INTRAMUSCULAR | Status: DC | PRN

## 2011-03-11 MED ORDER — DOCUSATE SODIUM 100 MG PO CAPS
100.0000 mg | ORAL_CAPSULE | Freq: Two times a day (BID) | ORAL | Status: DC
  Administered 2011-03-11 – 2011-03-16 (×10): 100 mg via ORAL
  Filled 2011-03-11 (×8): qty 1

## 2011-03-11 MED ORDER — SODIUM CHLORIDE 0.9 % IJ SOLN: 9.0000 mL | INTRAMUSCULAR | Status: DC | PRN

## 2011-03-11 MED ORDER — FENTANYL CITRATE 0.05 MG/ML IJ SOLN
INTRAMUSCULAR | Status: DC | PRN
  Administered 2011-03-11 (×4): 50 ug via INTRAVENOUS
  Administered 2011-03-11: 150 ug via INTRAVENOUS
  Administered 2011-03-11: 100 ug via INTRAVENOUS
  Administered 2011-03-11 (×6): 50 ug via INTRAVENOUS

## 2011-03-11 MED ORDER — MENTHOL 3 MG MT LOZG: 1.0000 | LOZENGE | OROMUCOSAL | Status: DC | PRN

## 2011-03-11 MED ORDER — VECURONIUM BROMIDE 10 MG IV SOLR
INTRAVENOUS | Status: DC | PRN
  Administered 2011-03-11: 2 mg via INTRAVENOUS
  Administered 2011-03-11 (×4): 1 mg via INTRAVENOUS

## 2011-03-11 SURGICAL SUPPLY — 78 items
BAG DECANTER FOR FLEXI CONT (MISCELLANEOUS) ×2 IMPLANT
BENZOIN TINCTURE PRP APPL 2/3 (GAUZE/BANDAGES/DRESSINGS) ×2 IMPLANT
BLADE SURG ROTATE 9660 (MISCELLANEOUS) IMPLANT
BONE VOID FILLER STRIP 10CC (Bone Implant) ×2 IMPLANT
BUR MATCHSTICK NEURO 3.0 LAGG (BURR) ×2 IMPLANT
BUR PRECISION FLUTE 5.0 (BURR) ×2 IMPLANT
CANISTER SUCTION 2500CC (MISCELLANEOUS) ×2 IMPLANT
CLOTH BEACON ORANGE TIMEOUT ST (SAFETY) ×2 IMPLANT
CONT SPEC 4OZ CLIKSEAL STRL BL (MISCELLANEOUS) ×4 IMPLANT
COVER BACK TABLE 24X17X13 BIG (DRAPES) IMPLANT
COVER TABLE BACK 60X90 (DRAPES) ×2 IMPLANT
DERMABOND ADVANCED (GAUZE/BANDAGES/DRESSINGS) ×1
DERMABOND ADVANCED .7 DNX12 (GAUZE/BANDAGES/DRESSINGS) ×1 IMPLANT
DRAPE C-ARM 42X72 X-RAY (DRAPES) ×6 IMPLANT
DRAPE LAPAROTOMY 100X72X124 (DRAPES) ×2 IMPLANT
DRAPE POUCH INSTRU U-SHP 10X18 (DRAPES) ×2 IMPLANT
DRAPE PROXIMA HALF (DRAPES) ×6 IMPLANT
DRAPE SURG 17X23 STRL (DRAPES) ×2 IMPLANT
DRESSING TELFA 8X3 (GAUZE/BANDAGES/DRESSINGS) ×2 IMPLANT
DURAPREP 26ML APPLICATOR (WOUND CARE) ×2 IMPLANT
ELECT REM PT RETURN 9FT ADLT (ELECTROSURGICAL) ×2
ELECTRODE REM PT RTRN 9FT ADLT (ELECTROSURGICAL) ×1 IMPLANT
GAUZE SPONGE 4X4 16PLY XRAY LF (GAUZE/BANDAGES/DRESSINGS) IMPLANT
GLOVE BIO SURGEON STRL SZ8 (GLOVE) ×4 IMPLANT
GLOVE BIOGEL PI IND STRL 7.5 (GLOVE) ×2 IMPLANT
GLOVE BIOGEL PI IND STRL 8 (GLOVE) ×2 IMPLANT
GLOVE BIOGEL PI IND STRL 8.5 (GLOVE) ×3 IMPLANT
GLOVE BIOGEL PI INDICATOR 7.5 (GLOVE) ×2
GLOVE BIOGEL PI INDICATOR 8 (GLOVE) ×2
GLOVE BIOGEL PI INDICATOR 8.5 (GLOVE) ×3
GLOVE ECLIPSE 7.5 STRL STRAW (GLOVE) ×10 IMPLANT
GLOVE ECLIPSE 8.0 STRL XLNG CF (GLOVE) ×6 IMPLANT
GLOVE EXAM NITRILE LRG STRL (GLOVE) IMPLANT
GLOVE EXAM NITRILE MD LF STRL (GLOVE) IMPLANT
GLOVE EXAM NITRILE XL STR (GLOVE) IMPLANT
GLOVE EXAM NITRILE XS STR PU (GLOVE) IMPLANT
GLOVE INDICATOR 7.5 STRL GRN (GLOVE) ×2 IMPLANT
GOWN BRE IMP SLV AUR LG STRL (GOWN DISPOSABLE) IMPLANT
GOWN BRE IMP SLV AUR XL STRL (GOWN DISPOSABLE) ×8 IMPLANT
GOWN STRL REIN 2XL LVL4 (GOWN DISPOSABLE) ×4 IMPLANT
KIT BASIN OR (CUSTOM PROCEDURE TRAY) ×2 IMPLANT
KIT INFUSE X SMALL 1.4CC (Orthopedic Implant) ×2 IMPLANT
KIT POSITION SURG JACKSON T1 (MISCELLANEOUS) ×2 IMPLANT
KIT ROOM TURNOVER OR (KITS) ×2 IMPLANT
MILL MEDIUM DISP (BLADE) ×2 IMPLANT
NEEDLE 18GX1X1/2 (RX/OR ONLY) (NEEDLE) ×2 IMPLANT
NEEDLE HYPO 25X1 1.5 SAFETY (NEEDLE) ×2 IMPLANT
NEEDLE SPNL 18GX3.5 QUINCKE PK (NEEDLE) ×2 IMPLANT
NS IRRIG 1000ML POUR BTL (IV SOLUTION) ×2 IMPLANT
PACK LAMINECTOMY NEURO (CUSTOM PROCEDURE TRAY) ×2 IMPLANT
PAD ARMBOARD 7.5X6 YLW CONV (MISCELLANEOUS) ×6 IMPLANT
PATTIES SURGICAL .5 X.5 (GAUZE/BANDAGES/DRESSINGS) IMPLANT
PATTIES SURGICAL .5 X1 (DISPOSABLE) IMPLANT
PATTIES SURGICAL 1X1 (DISPOSABLE) IMPLANT
PEEK PLIF NOVEL 9X25X12 (Peek) ×4 IMPLANT
ROD 35MM (Rod) ×4 IMPLANT
SCREW 40MM (Screw) ×4 IMPLANT
SCREW POLYAX 6.5X45MM (Screw) ×4 IMPLANT
SCREW SET SPINAL STD HEXALOBE (Screw) ×8 IMPLANT
SPONGE GAUZE 4X4 12PLY (GAUZE/BANDAGES/DRESSINGS) ×2 IMPLANT
SPONGE LAP 4X18 X RAY DECT (DISPOSABLE) IMPLANT
SPONGE SURGIFOAM ABS GEL 100 (HEMOSTASIS) IMPLANT
STAPLER SKIN PROX WIDE 3.9 (STAPLE) IMPLANT
STRIP CLOSURE SKIN 1/2X4 (GAUZE/BANDAGES/DRESSINGS) IMPLANT
SUT VIC AB 1 CT1 18XBRD ANBCTR (SUTURE) ×1 IMPLANT
SUT VIC AB 1 CT1 8-18 (SUTURE) ×1
SUT VIC AB 2-0 CT1 18 (SUTURE) ×2 IMPLANT
SUT VIC AB 3-0 SH 8-18 (SUTURE) ×2 IMPLANT
SYR 20CC LL (SYRINGE) ×2 IMPLANT
SYR 3ML LL SCALE MARK (SYRINGE) ×6 IMPLANT
SYR 5ML LL (SYRINGE) IMPLANT
SYR INSULIN 1ML 31GX6 SAFETY (SYRINGE) IMPLANT
TAPE CLOTH SURG 4X10 WHT LF (GAUZE/BANDAGES/DRESSINGS) ×2 IMPLANT
TOWEL OR 17X24 6PK STRL BLUE (TOWEL DISPOSABLE) ×2 IMPLANT
TOWEL OR 17X26 10 PK STRL BLUE (TOWEL DISPOSABLE) ×2 IMPLANT
TRAP SPECIMEN MUCOUS 40CC (MISCELLANEOUS) ×2 IMPLANT
TRAY FOLEY CATH 14FRSI W/METER (CATHETERS) ×2 IMPLANT
WATER STERILE IRR 1000ML POUR (IV SOLUTION) ×2 IMPLANT

## 2011-03-11 NOTE — Transfer of Care (Signed)
Immediate Anesthesia Transfer of Care Note  Patient: Michael Bender  Procedure(s) Performed:  POSTERIOR LUMBAR FUSION 1 LEVEL - Lfour-five decompression with posterior lumbar interbody fusion with interbody prosthesis posterolateral arthrodesis and posterior nonsegmental instrumentation  Patient Location: PACU  Anesthesia Type: General  Level of Consciousness: awake, alert  and oriented  Airway & Oxygen Therapy: Patient Spontanous Breathing and Patient connected to nasal cannula oxygen  Post-op Assessment: Report given to PACU RN and Post -op Vital signs reviewed and stable  Post vital signs: stable  Complications: No apparent anesthesia complications

## 2011-03-11 NOTE — Anesthesia Postprocedure Evaluation (Signed)
  Anesthesia Post-op Note  Patient: Michael Bender  Procedure(s) Performed:  POSTERIOR LUMBAR FUSION 1 LEVEL - Lfour-five decompression with posterior lumbar interbody fusion with interbody prosthesis posterolateral arthrodesis and posterior nonsegmental instrumentation  Patient Location: PACU  Anesthesia Type: General  Level of Consciousness: awake  Airway and Oxygen Therapy: Patient Spontanous Breathing and Patient connected to nasal cannula oxygen  Post-op Pain: moderate  Post-op Assessment: Post-op Vital signs reviewed, Patient's Cardiovascular Status Stable, Respiratory Function Stable and Patent Airway  Post-op Vital Signs: Reviewed and stable  Complications: No apparent anesthesia complications

## 2011-03-11 NOTE — Progress Notes (Signed)
Patient states headache still 6 on 1-10 scale. On way to OR via to Neuro OR

## 2011-03-11 NOTE — Anesthesia Preprocedure Evaluation (Addendum)
Anesthesia Evaluation  Patient identified by MRN, date of birth, ID band Patient awake    Reviewed: Allergy & Precautions, H&P , NPO status , Patient's Chart, lab work & pertinent test results  Airway Mallampati: II      Dental   Pulmonary asthma , pneumonia , COPD         Cardiovascular hypertension, Pt. on medications Regular Normal    Neuro/Psych  Headaches, Negative Psych ROS   GI/Hepatic Neg liver ROS, GERD-  ,  Endo/Other    Renal/GU      Musculoskeletal   Abdominal   Peds  Hematology   Anesthesia Other Findings   Reproductive/Obstetrics                          Anesthesia Physical Anesthesia Plan  ASA: III  Anesthesia Plan: General   Post-op Pain Management:    Induction: Intravenous  Airway Management Planned: Oral ETT  Additional Equipment:   Intra-op Plan:   Post-operative Plan:   Informed Consent: I have reviewed the patients History and Physical, chart, labs and discussed the procedure including the risks, benefits and alternatives for the proposed anesthesia with the patient or authorized representative who has indicated his/her understanding and acceptance.     Plan Discussed with:   Anesthesia Plan Comments:         Anesthesia Quick Evaluation

## 2011-03-11 NOTE — Preoperative (Signed)
Beta Blockers   Reason not to administer Beta Blockers:Not Applicable 

## 2011-03-11 NOTE — Op Note (Signed)
03/11/2011  7:10 PM  PATIENT:  Michael Bender  66 y.o. male  PRE-OPERATIVE DIAGNOSIS:  lumbar stenosis synovial cyst lumbar spondylosis spondylolisthesis lumbar radiculoapthy L4/5  POST-OPERATIVE DIAGNOSIS:  lumbar stenosis synovial cyst lumbar spondylosis spondylolisthesis lumbar radiculoapthy L4/5   PROCEDURE:  Procedure(s): POSTERIOR LUMBAR FUSION 1 LEVEL with resection of synovial cyst  SURGEON:  Surgeon(s): Dorian Heckle, MD Stefani Dama, MD  PHYSICIAN ASSISTANT:   ASSISTANTS: Poteat, RN   ANESTHESIA:   general  EBL:     BLOOD ADMINISTERED:none  DRAINS: none   LOCAL MEDICATIONS USED:  LIDOCAINE 10CC  SPECIMEN:  No Specimen  DISPOSITION OF SPECIMEN:  N/A  COUNTS:  YES  TOURNIQUET:  * No tourniquets in log *  DICTATION: Patient is 66 year old man with severe stenosis and spondylolisthesis at L4/5 with a huge synovial cyst nearly completely obliterating the spinal canal from the left facet joint with a smaller cyst emanating from the right facet joint. He has a severe left L5 radiculopathy.it was elected to take him to surgery for decompression and fusion at this level.   Procedure: Patient was placed in a prone position on the [place] table after smooth and uncomplicated induction of general endotracheal anesthesia. His low back was prepped and draped in usual sterile fashion with DuraPrep. Area of incision was infiltrated with local lidocaine. Incision was made to the lumbodorsal fascia was incised and exposure was performed of the L4-L5 spinous processes laminae facet joint and transverse processes. Intraoperative x-ray was obtained which confirmed correct orientation. A total laminectomy of L4 and the superior aspect of L5  was performed with disarticulation of the facet joints at this level and thorough decompression was performed of both L4 and L5 nerve roots along with the common dural tube. A thorough resection of synovial cyst was performed on both sides with  thorough discectomy and preparation of the endplates for grafting a trial spacer was placed this level and a thorough discectomy was performed on the right as well. Bone autograft was packed within the interspace bilaterally along with extra small BMP kit and NexOss bone graft extender. Bilateral medium 12 mm peek cages were packed with BMP and extender and was inserted the interspace and countersunk appropriately. The posterolateral region was extensively decorticated and pedicle probes were placed at L4 and L5 bilaterally. Intraoperative fluoroscopy confirmed correct orientationin the AP and lateral plane. 40 x 6.5 mm pedicle screws were placed at L5 bilaterally and 45 x 6.5 mm screws placed at L4 bilaterally final x-rays demonstrated well-positioned interbody grafts and pedicle screw fixation. A 35 mm lordotic rod was placed on the right and a 35 mm rod was placed on the left locked down in situ and the posterolateral region was packed with the remaining autograft on the right and bone graft extender and bone autograft on the left.  Fascia was closed with 1 Vicryl sutures skin edges were reapproximated 2 and 3-0 Vicryl sutures. The wound is dressed with benzoin Steri-Strips Telfa gauze and tape the patient was extubated in the operating room and taken to recovery in stable satisfactory condition he tolerated traction well counts were correct at the end of the case.  PLAN OF CARE: Admit to inpatient   PATIENT DISPOSITION:  PACU - hemodynamically stable.   Delay start of Pharmacological VTE agent (>24hrs) due to surgical blood loss or risk of bleeding:  YES

## 2011-03-11 NOTE — Interval H&P Note (Signed)
History and Physical Interval Note:  03/11/2011 7:24 AM  Michael Bender  has presented today for surgery, with the diagnosis of lumbar stenosis synovial cyst lumbar spondylosis lumbar radiculoapthy  The various methods of treatment have been discussed with the patient and family. After consideration of risks, benefits and other options for treatment, the patient has consented to  Procedure(s): POSTERIOR LUMBAR FUSION 1 LEVEL as a surgical intervention .  The patients' history has been reviewed, patient examined, no change in status, stable for surgery.  I have reviewed the patients' chart and labs.  Questions were answered to the patient's satisfaction.     [name]  Date of Initial H&P:02/28/2011  History reviewed, patient examined, no change in status, stable for surgery.

## 2011-03-12 ENCOUNTER — Encounter (HOSPITAL_COMMUNITY): Payer: Self-pay | Admitting: *Deleted

## 2011-03-12 MED ORDER — HYDROMORPHONE 0.3 MG/ML IV SOLN
INTRAVENOUS | Status: AC
  Filled 2011-03-12: qty 25

## 2011-03-12 NOTE — Progress Notes (Signed)
Occupational Therapy Evaluation Patient Details Name: Michael Bender MRN: [id number] DOB: [birth year] Today's Date: 03/12/2011  Problem List:  Patient Active Problem List  Diagnoses  . PAIN, CHRONIC NEC  . OTITIS EXTERNA  . EXTRINSIC ASTHMA, UNSPECIFIED  . Chronic Airway Obstruction, not Elsewhere Classified  . CELLULITIS AND ABSCESS OF ORAL SOFT TISSUES  . GERD  . CONSTIPATION, CHRONIC  . DISC DISEASE, LUMBAR  . OTHER SYMPTOMS INVOLVING HEAD AND NECK  . DYSPNEA  . HYPERTENSION NEC  . Bilateral leg pain    Past Medical History:  Past Medical History  Diagnosis Date  . Pneumonia   . Hypertension   . Prostate disease   . Headache     last one 6 months ago  . Hemorrhoids, internal   . Arthritis     Back  . Blood transfusion     as a child  . Neck rigidity     post cervical fusion  . Nocturia    Past Surgical History:  Past Surgical History  Procedure Date  . Cervical fusion     C2/C 3  four surgeries    OT Assessment/Plan/Recommendation OT Assessment Clinical Impression Statement: Pt s/p lumbar fusion.  Pt able to perform functional transfers with mod assist due to pain and ataxic movement in bil. LE.  Pt requesting rehab at SNF before returning home.  Will benefit from acute OT to increase level of I with functional transfers and BADLs to mod I level in prep for rehab at SNF before d/c home alone. OT Recommendation/Assessment: Patient will need skilled OT in the acute care venue OT Problem List: Decreased activity tolerance;Decreased knowledge of use of DME or AE;Decreased knowledge of precautions;Pain;Impaired balance (sitting and/or standing) OT Therapy Diagnosis : Generalized weakness;Acute pain OT Plan OT Frequency: Min 2X/week OT Treatment/Interventions: Self-care/ADL training;DME and/or AE instruction;Therapeutic activities;Patient/family education;Balance training OT Recommendation Follow Up Recommendations: Skilled nursing facility Equipment Recommended:  Defer to next venue Individuals Consulted Consulted and Agree with Results and Recommendations: Patient OT Goals Acute Rehab OT Goals OT Goal Formulation: With patient Time For Goal Achievement: 2 weeks ADL Goals Pt Will Perform Grooming: with modified independence;Standing at sink ADL Goal: Grooming - Progress: Goal set today Pt Will Perform Lower Body Bathing: with modified independence;Sit to stand from chair;Sit to stand from bed;with adaptive equipment ADL Goal: Lower Body Bathing - Progress: Goal set today Pt Will Perform Lower Body Dressing: with modified independence;Sit to stand from chair;Sit to stand from bed;with adaptive equipment ADL Goal: Lower Body Dressing - Progress: Goal set today Pt Will Transfer to Toilet: with modified independence;with DME;Ambulation;3-in-1;Maintaining back safety precautions ADL Goal: Toilet Transfer - Progress: Goal set today Additional ADL Goal #1: Pt will don/doff back brace with mod I sitting EOB in prep for ADL activity. ADL Goal: Additional Goal #1 - Progress: Goal set today Miscellaneous OT Goals Miscellaneous OT Goal #1: Pt will perform bed mobility with mod I in prep for EOB ADLs, OT Goal: Miscellaneous Goal #1 - Progress: Goal set today  OT Evaluation Precautions/Restrictions  Precautions Precautions: Back Precaution Comments: pt educated on 3/3 back precautions Required Braces or Orthoses: Yes Spinal Brace: Lumbar corset Restrictions Weight Bearing Restrictions: No Prior Functioning Home Living Lives With: Alone Receives Help From: Family (sons live close) Type of Home: House Home Layout: One level Home Access: Stairs to enter Entrance Stairs-Rails: None Entrance Stairs-Number of Steps: 1 (curb to get into door) Bathroom Shower/Tub: Forensic scientist: Standard Bathroom Accessibility: Yes How Accessible: Accessible  via walker Home Adaptive Equipment: Other (comment);Straight cane (walking  sticks) Prior Function Level of Independence: Independent with basic ADLs;Independent with homemaking with ambulation;Independent with gait;Independent with transfers Able to Take Stairs?: Yes Driving: Yes Vocation: On disability Leisure: Hobbies-yes (Comment) Comments: walk the dog(Rusty) ADL ADL Lower Body Bathing: Simulated;Moderate assistance Lower Body Bathing Details (indicate cue type and reason): Difficulty reaching bil. feet due to discomfort and pain when crossing legs. Where Assessed - Lower Body Bathing: Sit to stand from bed Lower Body Dressing: Simulated;Moderate assistance Lower Body Dressing Details (indicate cue type and reason): Difficulty reaching bil. feet due to discomfort and pain when crossing legs Where Assessed - Lower Body Dressing: Sit to stand from bed Toilet Transfer: Simulated;Moderate assistance Toilet Transfer Details (indicate cue type and reason): Pt transferred from bed to chair. Ataxic "jerky" movements in bil. LE (pt reports calves were "cramping") Mod assist to keep pt within RW and to maneuver RW during turning.  Toilet Transfer Method: Stand pivot Ambulation Related to ADLs: Pt requires verbal and manual cues to maintain proper positioning within RW and to maneuver RW. Vision/Perception    Cognition Cognition Arousal/Alertness: Awake/alert Overall Cognitive Status: Appears within functional limits for tasks assessed Orientation Level: Oriented X4 Sensation/Coordination Sensation Light Touch: Appears Intact Coordination Gross Motor Movements are Fluid and Coordinated: No (jerk, ataxic movements(RN aware)) Extremity Assessment RUE Assessment RUE Assessment: Within Functional Limits LUE Assessment LUE Assessment: Within Functional Limits Mobility  Bed Mobility Bed Mobility: Yes Rolling Right: 4: Min assist;With rail Rolling Right Details (indicate cue type and reason): VC for sequencing to maintain back rpecaustions Right Sidelying to  Sit: 3: Mod assist;With rails;HOB elevated (comment degrees) (35 degrees) Right Sidelying to Sit Details (indicate cue type and reason): VC for sequencing to maintain back precautions. Assist through pelvic and trunk support into sitting Sitting - Scoot to Edge of Bed: 6: Modified independent (Device/Increase time) Transfers Sit to Stand: 3: Mod assist;With upper extremity assist;From bed Sit to Stand Details (indicate cue type and reason): VC for hand placement for safety. Mod assist for stability as pt has uncontrolled movements upon standing. No loss of balance or buckling. Stand to Sit: 4: Min assist;With upper extremity assist;To chair/3-in-1 Stand to Sit Details: VC for sequencing and proper technique to maintain back precautions Exercises   End of Session OT - End of Session Equipment Utilized During Treatment: Gait belt;Back brace Activity Tolerance: Patient limited by fatigue;Patient limited by pain Patient left: with call bell in reach;in chair Nurse Communication: Mobility status for transfers;Other (comment) (ataxic movement of LE during transfer) General Behavior During Session: [facility] for tasks performed Cognition: [facility] for tasks performed   Cipriano Mile 03/12/2011, 5:33 PM  03/12/2011 Cipriano Mile OTR/L Pager [phone] Office [phone]

## 2011-03-12 NOTE — Progress Notes (Signed)
Physical Therapy Evaluation Patient Details Name: Michael Bender MRN: [id number] DOB: 04-14-1945 Today's Date: 03/12/2011  Problem List:  Patient Active Problem List  Diagnoses  . PAIN, CHRONIC NEC  . OTITIS EXTERNA  . EXTRINSIC ASTHMA, UNSPECIFIED  . Chronic Airway Obstruction, not Elsewhere Classified  . CELLULITIS AND ABSCESS OF ORAL SOFT TISSUES  . GERD  . CONSTIPATION, CHRONIC  . DISC DISEASE, LUMBAR  . OTHER SYMPTOMS INVOLVING HEAD AND NECK  . DYSPNEA  . HYPERTENSION NEC  . Bilateral leg pain    Past Medical History:  Past Medical History  Diagnosis Date  . Pneumonia   . Hypertension   . Prostate disease   . Headache     last one 6 months ago  . Hemorrhoids, internal   . Arthritis     Back  . Blood transfusion     as a child  . Neck rigidity     post cervical fusion  . Nocturia    Past Surgical History:  Past Surgical History  Procedure Date  . Cervical fusion     C2/C 3  four surgeries    PT Assessment/Plan/Recommendation PT Assessment Clinical Impression Statement: Pt presents with a medical diagnosis of Lumbar fusion with ataxic movements. Pt is unable to maintain a steady position both statically and dynamically. Pt will benefit from skilled PT in the acute care setting in order to maximize functional mobility and safety PT Recommendation/Assessment: Patient will need skilled PT in the acute care venue PT Problem List: Decreased activity tolerance;Decreased balance;Decreased mobility;Decreased knowledge of use of DME;Decreased safety awareness;Decreased knowledge of precautions;Pain Barriers to Discharge: Decreased caregiver support PT Therapy Diagnosis : Abnormality of gait;Acute pain PT Plan PT Frequency: Min 5X/week PT Recommendation Follow Up Recommendations: Skilled nursing facility Equipment Recommended: Defer to next venue PT Goals  Acute Rehab PT Goals PT Goal Formulation: With patient Time For Goal Achievement: 7 days Pt will go  Supine/Side to Sit: with modified independence PT Goal: Supine/Side to Sit - Progress: Goal set today Pt will go Sit to Supine/Side: with modified independence PT Goal: Sit to Supine/Side - Progress: Goal set today Pt will go Sit to Stand: with supervision PT Goal: Sit to Stand - Progress: Goal set today Pt will go Stand to Sit: with supervision PT Goal: Stand to Sit - Progress: Goal set today Pt will Transfer Bed to Chair/Chair to Bed: with supervision PT Transfer Goal: Bed to Chair/Chair to Bed - Progress: Goal set today Pt will Ambulate: >150 feet;with supervision;with least restrictive assistive device PT Goal: Ambulate - Progress: Goal set today Additional Goals Additional Goal #1: Pt will be able to verbalize and demonstrate 3/3 back precautions throughout mobility PT Goal: Additional Goal #1 - Progress: Goal set today  PT Evaluation Precautions/Restrictions  Precautions Precautions: Back Precaution Comments: pt educated on 3/3 back precautions Required Braces or Orthoses: Yes Spinal Brace: Lumbar corset Prior Functioning  Home Living Lives With: Alone Receives Help From: Family (sons live close) Type of Home: House Home Layout: One level Home Access: Stairs to enter Entrance Stairs-Rails: None Entrance Stairs-Number of Steps: 1 (curb to get into door) Bathroom Shower/Tub: Forensic scientist: Standard Bathroom Accessibility: Yes How Accessible: Accessible via walker Home Adaptive Equipment: Other (comment);Straight cane (walking sticks) Prior Function Level of Independence: Independent with basic ADLs;Independent with homemaking with ambulation;Independent with gait;Independent with transfers Able to Take Stairs?: Yes Driving: Yes Vocation: On disability Leisure: Hobbies-yes (Comment) Comments: walk the dog(Rusty) Cognition Cognition Arousal/Alertness: Awake/alert Overall Cognitive Status:  Appears within functional limits for tasks  assessed Orientation Level: Oriented X4 Sensation/Coordination Sensation Light Touch: Appears Intact Coordination Gross Motor Movements are Fluid and Coordinated: No (jerk, ataxic movements(RN aware)) Extremity Assessment RLE Assessment RLE Assessment: Within Functional Limits LLE Assessment LLE Assessment: Within Functional Limits Mobility (including Balance) Bed Mobility Bed Mobility: Yes Rolling Right: 4: Min assist;With rail Rolling Right Details (indicate cue type and reason): VC for sequencing to maintain back rpecaustions Right Sidelying to Sit: 3: Mod assist;With rails;HOB elevated (comment degrees) (35 degrees) Right Sidelying to Sit Details (indicate cue type and reason): VC for sequencing to maintain back precautions. Assist through pelvic and trunk support into sitting Sitting - Scoot to Edge of Bed: 6: Modified independent (Device/Increase time) Transfers Transfers: Yes Sit to Stand: 3: Mod assist;With upper extremity assist;From bed Sit to Stand Details (indicate cue type and reason): VC for hand placement for safety. Mod assist for stability as pt has uncontrolled movements upon standing. No loss of balance or buckling. Stand to Sit: 4: Min assist;With upper extremity assist;To chair/3-in-1 Stand to Sit Details: VC for sequencing and proper technique to maintain back precautions Ambulation/Gait Ambulation/Gait: Yes Ambulation/Gait Assistance: 3: Mod assist Ambulation/Gait Assistance Details (indicate cue type and reason): Mod assist for stability secondary to jerky movements and unsteadiness with ambulation. VC throughout for safety with distance to RW for increased stability Ambulation Distance (Feet): 25 Feet Assistive device: Rolling walker Gait Pattern: Ataxic;Trunk flexed;Decreased dorsiflexion - right;Decreased hip/knee flexion - right;Decreased hip/knee flexion - left;Decreased stride length;Step-to pattern Gait velocity: Decreased gait speed    Exercise     End of Session PT - End of Session Equipment Utilized During Treatment: Gait belt;Back brace Activity Tolerance: Patient tolerated treatment well Patient left: in chair;with call bell in reach Nurse Communication: Mobility status for transfers;Mobility status for ambulation;Other (comment) (Ataxia) General Behavior During Session: [facility] for tasks performed Cognition: [facility] for tasks performed  Milana Kidney 03/12/2011, 4:52 PM  03/12/2011 Milana Kidney DPT PAGER: [phone] OFFICE: [phone]

## 2011-03-12 NOTE — Progress Notes (Signed)
   CARE MANAGEMENT NOTE 03/12/2011  Patient:  Michael Bender, Michael Bender   Account Number:  [id number]  Date Initiated:  03/12/2011  Documentation initiated by:  [facility]  Subjective/Objective Assessment:   decompression and fusion L4/5     Action/Plan:   Anticipated DC Date:  03/14/2011   Anticipated DC Plan:  HOME W HOME HEALTH SERVICES      DC Planning Services  CM consult      Choice offered to / List presented to:     DME arranged  3-N-1  Levan Hurst      DME agency  [facility]        Status of service:  In process, will continue to follow Medicare Important Message given?   (If response is "NO", the following Medicare IM given date fields will be blank) Date Medicare IM given:   Date Additional Medicare IM given:    Discharge Disposition:    Per UR Regulation:    Comments:  03/12/2011 1200 Contacted [facility] for DME for home. Isidoro Donning RN CCM Case Mgmt phone [phone]

## 2011-03-12 NOTE — Progress Notes (Signed)
Subjective: Patient reports sore in back, but better in legs.  Headache unchanged from preop  Objective: Vital signs in last 24 hours: Temp:  [97.4 F (36.3 C)-99.1 F (37.3 C)] 99.1 F (37.3 C) (02/09 0551) Pulse Rate:  [49-87] 87  (02/09 0551) Resp:  [10-21] 20  (02/09 0838) BP: (121-173)/(62-91) 122/70 mmHg (02/09 0551) SpO2:  [96 %-100 %] 98 % (02/09 0551) FiO2 (%):  [2 %] 2 % (02/09 0551) Weight:  [84.1 kg (185 lb 6.5 oz)] 84.1 kg (185 lb 6.5 oz) (02/08 2112)  Intake/Output from previous day: 02/08 0701 - 02/09 0700 In: 3140 [I.V.:3090; IV Piggyback:50] Out: 3850 [Urine:3650; Blood:200] Intake/Output this shift:    Neurologic: Alert and oriented X 3, normal strength and tone. Normal symmetric reflexes. Normal coordination and gait Wound:Dressing CDI  Lab Results: No results found for this basename: WBC:2,HGB:2,HCT:2,PLT:2 in the last 72 hours BMET No results found for this basename: NA:2,K:2,CL:2,CO2:2,GLUCOSE:2,BUN:2,CREATININE:2,CALCIUM:2 in the last 72 hours  Studies/Results: Dg Lumbar Spine 2-3 Views  03/11/2011  *RADIOLOGY REPORT*  Clinical Data: L4-5 PLIF  LUMBAR SPINE - 2-3 VIEW  Comparison: 03/11/2011  Findings: Intraoperative frontal and lateral fluoroscopic images demonstrate pedicle screws with interbody fusion at L4-5.  IMPRESSION: Intraoperative fluoroscopic images during L4-5 PLIF, as above.  Original Report Authenticated By: Charline Bills, M.D.   Dg Lumbar Spine 1 View  03/11/2011  *RADIOLOGY REPORT*  Clinical Data: Lumbar 4 5 fusion.  LUMBAR SPINE - 1 VIEW  Comparison: MRI 02/20/2011  Findings: Using the same numbering as previous MRI, surgical instruments overlying the posterior elements of L5 and L4.  A surgical retractors in place.  IMPRESSION: Intraoperative localization.  Original Report Authenticated By: Patterson Hammersmith, M.D.    Assessment/Plan: Doing well postop day 1 decompression and fusion L4/5.  Mobilize with PT.  Bedside commode and DC  foley.  LOS: 1 day     Michael Bender D 03/12/2011, 9:09 AM

## 2011-03-13 MED ORDER — PANTOPRAZOLE SODIUM 40 MG PO TBEC
40.0000 mg | DELAYED_RELEASE_TABLET | Freq: Every day | ORAL | Status: DC
  Administered 2011-03-13 – 2011-03-15 (×3): 40 mg via ORAL
  Filled 2011-03-13 (×3): qty 1

## 2011-03-13 MED ORDER — HYDROMORPHONE 0.3 MG/ML IV SOLN
INTRAVENOUS | Status: AC
  Filled 2011-03-13: qty 25

## 2011-03-13 NOTE — Progress Notes (Signed)
Postop day 2. Overall doing reasonably well. Tolerated therapy well. Denies any lower extremity pain. Still with significant back pain.  Patient is afebrile. His vitals are stable. Her and sensory function extremities is stable. Wound healing well.  Progressing reasonably well following decompression and fusion surgery. Continue rehabilitation efforts. Patient will likely require SNIF unit placement.

## 2011-03-13 NOTE — Progress Notes (Signed)
Physical Therapy Treatment Patient Details Name: Michael Bender MRN: [id number] DOB: 03-Dec-1945 Today's Date: 03/13/2011  PT Assessment/Plan  PT - Assessment/Plan Comments on Treatment Session: Pt progressing well this session with improvements in stability during ambulation as well as less ataxic movements throughout session. Will continue per plan. PT Plan: Discharge plan remains appropriate;Frequency remains appropriate PT Frequency: Min 5X/week Follow Up Recommendations: Skilled nursing facility Equipment Recommended: Defer to next venue PT Goals  Acute Rehab PT Goals PT Goal Formulation: With patient PT Goal: Supine/Side to Sit - Progress: Progressing toward goal PT Goal: Sit to Supine/Side - Progress: Progressing toward goal PT Goal: Sit to Stand - Progress: Progressing toward goal PT Goal: Stand to Sit - Progress: Progressing toward goal PT Transfer Goal: Bed to Chair/Chair to Bed - Progress: Progressing toward goal PT Goal: Ambulate - Progress: Progressing toward goal Additional Goals PT Goal: Additional Goal #1 - Progress: Progressing toward goal  PT Treatment Precautions/Restrictions  Precautions Precautions: Back Precaution Comments: Pt able to demonstrate 3/3 back precautions Required Braces or Orthoses: Yes Spinal Brace: Lumbar corset Restrictions Weight Bearing Restrictions: No Mobility (including Balance) Bed Mobility Bed Mobility: Yes Sit to Supine: 4: Min assist;With rail;HOB elevated (comment degrees) (20) Sit to Supine - Details (indicate cue type and reason): VC for proper sequencing to maintain back precautions. Min assist to control trunk into supine Transfers Transfers: Yes Sit to Stand: 4: Min assist;With upper extremity assist;From chair/3-in-1 Sit to Stand Details (indicate cue type and reason): VC for hand placement for safety. Pt with more controlled stand today Stand to Sit: 4: Min assist;With upper extremity assist;To chair/3-in-1 Stand to  Sit Details: VC for hand placement and safety upon sitting Ambulation/Gait Ambulation/Gait: Yes Ambulation/Gait Assistance: 4: Min assist Ambulation/Gait Assistance Details (indicate cue type and reason): VC for safety with distance to RW. Pt with less jerky movements during ambulation today, although still with increased weight bearing through UEs to maintain stability in LEs.  Ambulation Distance (Feet): 75 Feet Assistive device: Rolling walker Gait Pattern: Ataxic;Trunk flexed;Decreased dorsiflexion - right;Decreased hip/knee flexion - right;Decreased hip/knee flexion - left;Decreased stride length;Step-to pattern Gait velocity: Decreased gait speed    Exercise    End of Session PT - End of Session Equipment Utilized During Treatment: Gait belt;Back brace Activity Tolerance: Patient tolerated treatment well Patient left: with call bell in reach;in bed;with family/visitor present Nurse Communication: Mobility status for transfers;Mobility status for ambulation;Other (comment) General Behavior During Session: [facility] for tasks performed Cognition: [facility] for tasks performed  Milana Kidney 03/13/2011, 11:11 AM  03/13/2011 Milana Kidney DPT PAGER: [phone] OFFICE: [phone]

## 2011-03-13 NOTE — Progress Notes (Signed)
When helping pt to stand up to urinate noted lots of "jerky" movements, pt is very shaky and sts that is different from his baseline. Will continue to monitor and keep the bed alarm on.

## 2011-03-13 NOTE — Progress Notes (Signed)
PHARMACIST - PHYSICIAN COMMUNICATION DR:   Venetia Maxon CONCERNING: IV to Oral Route Change Policy  RECOMMENDATION: This patient is receiving Protonix by the intravenous route.  Based on criteria approved by the Pharmacy and Therapeutics Committee, this drug is being converted to the equivalent oral dose form.  DESCRIPTION: These criteria include:  The patient is not being treated for an active GI bleed and does not exhibit a GI malabsorption state  The patient is eating (either orally or via tube) and/or has been taking other orally administered medications for a least 24 hours  If you have questions about this conversion, please contact the Pharmacy Department  []   [phone] )  [facility] [x]   [phone] )  [facility]  []   [phone] )  [facility] []   [phone] )  [facility]    Georgina Pillion, PharmD, BCPS Clinical Pharmacist Pager: [phone] 03/13/2011 1:57 PM

## 2011-03-13 NOTE — Progress Notes (Signed)
Previous shift RN reports foley being d/c'd around noon and pt still hasn't voided. This RN helped pt stand up to try and ise urinal however unsuccessfully. Bladder scanner showed but when doing in & out cath, 1500 cc's drained

## 2011-03-13 NOTE — Progress Notes (Signed)
CSW received consult for SNF placement. Met with pt's son, Ace Gins, at bedside--pt resting at time of visit. Pt's son agreeable to [facility] search. Weekday CSW to f/u with offers. Pt's sons can be reached by cell: [phone]  and [name]/[phone]. See chart for full CSW eval and FL2.  Dellie Burns, MSW, [place] [phone] (weekend)

## 2011-03-14 MED ORDER — FLEET ENEMA 7-19 GM/118ML RE ENEM: 1.0000 | ENEMA | Freq: Every day | RECTAL | Status: DC | PRN

## 2011-03-14 MED ORDER — BISACODYL 10 MG RE SUPP
10.0000 mg | Freq: Every day | RECTAL | Status: DC | PRN
  Administered 2011-03-14: 10 mg via RECTAL
  Filled 2011-03-14: qty 1

## 2011-03-14 MED ORDER — BISACODYL 10 MG RE SUPP: 10.0000 mg | Freq: Every day | RECTAL | Status: DC | PRN

## 2011-03-14 NOTE — Progress Notes (Signed)
Within last two hours pt pulled out his IV twice. He sts it was by accident, because he turns a lot in his bed. First time this RN assessed pt and called IV RN who restarted IV. Second time this RN as well as other floor RN attempted to restart IV, however unsuccessfully. IV team paged again, pt given PO pain meds.

## 2011-03-14 NOTE — Progress Notes (Signed)
Subjective: Patient reports "I thought I was getting better, but its been rough."  Objective: Vital signs in last 24 hours: Temp:  [97.8 F (36.6 C)-100.4 F (38 C)] 98.7 F (37.1 C) (02/11 0600) Pulse Rate:  [79-99] 80  (02/11 0600) Resp:  [14-20] 19  (02/11 0600) BP: (100-123)/(52-76) 100/63 mmHg (02/11 0600) SpO2:  [91 %-96 %] 93 % (02/11 0600)  Intake/Output from previous day: 02/10 0701 - 02/11 0700 In: 840 [P.O.:840] Out: 1475 [Urine:1475] Intake/Output this shift:    Alert, conversant. Sitting on side of bed without LSO, reporting lumbar pain with sitting & activity. Drsg secure, old blood present. No erythema, swelling, drainage. Good strength BLE.   Lab Results: No results found for this basename: WBC:2,HGB:2,HCT:2,PLT:2 in the last 72 hours BMET No results found for this basename: NA:2,K:2,CL:2,CO2:2,GLUCOSE:2,BUN:2,CREATININE:2,CALCIUM:2 in the last 72 hours  Studies/Results: No results found.  Assessment/Plan: Improving slowly. Discussed with pt ending PCA & using muscle relaxers for back pain/spasm instead.   LOS: 3 days  Mobilize with PT, progression toward SNF placement expected.Encouraged LSO use at bedside to minimize lumbar pain.   Georgiann Cocker 03/14/2011, 8:36 AM

## 2011-03-14 NOTE — Progress Notes (Signed)
UR COMPLETED  

## 2011-03-14 NOTE — Progress Notes (Signed)
OT Cancellation Note: (late entry for x2 attempts in A.M.)  Treatment cancelled today due to pt. declining due to requesting a nap. Will re-attempt as time allows.   Michael Bender, OTR/L Pager [phone] 03/14/2011, 1:34 PM

## 2011-03-14 NOTE — Progress Notes (Signed)
Physical Therapy Treatment Patient Details Name: Michael Bender MRN: [id number] DOB: [birth year] Today's Date: 03/14/2011  PT Assessment/Plan  PT - Assessment/Plan PT Plan: Discharge plan remains appropriate PT Frequency: Min 5X/week Follow Up Recommendations: Skilled nursing facility Equipment Recommended: Defer to next venue PT Goals  Acute Rehab PT Goals PT Goal: Supine/Side to Sit - Progress: Progressing toward goal PT Goal: Sit to Stand - Progress: Progressing toward goal PT Goal: Stand to Sit - Progress: Progressing toward goal PT Goal: Ambulate - Progress: Not met Additional Goals PT Goal: Additional Goal #1 - Progress: Not met  PT Treatment Precautions/Restrictions  Precautions Precautions: Back Precaution Comments: Pt only able to recall 1/3 back precautions.  Reviewed all 3 precautions.   Required Braces or Orthoses: Yes Spinal Brace: Lumbar corset;Applied in sitting position Restrictions Weight Bearing Restrictions: No Mobility (including Balance) Bed Mobility Rolling Right: 5: Supervision Rolling Right Details (indicate cue type and reason): Cues to reinforce back precautions.   Right Sidelying to Sit: 6: Modified independent (Device/Increase time);HOB flat Sitting - Scoot to Edge of Bed: 6: Modified independent (Device/Increase time) Transfers Sit to Stand: From bed;With upper extremity assist;4: Min assist Sit to Stand Details (indicate cue type and reason): Cues for hand placement. (A) for balance & steadiness.   Stand to Sit: Other (comment);To chair/3-in-1;With armrests;With upper extremity assist (Min Guard (A)) Stand to Sit Details: Cues for hand placement & use of UE's to control descent.   Ambulation/Gait Ambulation/Gait Assistance: 4: Min assist Ambulation/Gait Assistance Details (indicate cue type and reason): (A) for steadiness.  Continues to have jerky movements & also during static standing pt with increased unsteadiness & knees giving way.     Ambulation Distance (Feet): 100 Feet Assistive device: Rolling walker Gait Pattern: Step-through pattern;Decreased stride length;Trunk flexed;Decreased hip/knee flexion - left;Decreased hip/knee flexion - right;Left flexed knee in stance;Right flexed knee in stance Stairs: No Wheelchair Mobility Wheelchair Mobility: No    Exercise    End of Session PT - End of Session Equipment Utilized During Treatment: Gait belt;Back brace Activity Tolerance: Patient tolerated treatment well Patient left: in chair;with call bell in reach General Behavior During Session: [facility] for tasks performed Cognition: [facility] for tasks performed  [name] 03/14/2011, 1:04 PM [phone]

## 2011-03-14 NOTE — Progress Notes (Signed)
Occupational Therapy Treatment Patient Details Name: Michael Bender MRN: [id number] DOB: 03/07/1945 Today's Date: 03/14/2011  OT Assessment/Plan OT Assessment/Plan Comments on Treatment Session: Pt with bil knee buckling with ambulation to bathroom. Pt with buckling of knees with stand pivot to 3N1. Pt's knee flexion / buckling affects all ADLs at this time. Pt with a good sense of humor during session and fatigued requesting afternoon nap. Rn Kim notified of pt pain level and to provided medication at end of session. OT Plan: Discharge plan remains appropriate OT Frequency: Min 2X/week Follow Up Recommendations: Skilled nursing facility Equipment Recommended: Defer to next venue OT Goals Acute Rehab OT Goals OT Goal Formulation: With patient Time For Goal Achievement: 2 weeks ADL Goals Pt Will Perform Grooming: with modified independence;Standing at sink ADL Goal: Grooming - Progress: Progressing toward goals Pt Will Perform Lower Body Bathing: with modified independence;Sit to stand from chair;Sit to stand from bed;with adaptive equipment ADL Goal: Lower Body Bathing - Progress: Progressing toward goals Pt Will Perform Lower Body Dressing: with modified independence;Sit to stand from chair;Sit to stand from bed;with adaptive equipment ADL Goal: Lower Body Dressing - Progress: Progressing toward goals Pt Will Transfer to Toilet: with modified independence;with DME;Ambulation;3-in-1;Maintaining back safety precautions ADL Goal: Toilet Transfer - Progress: Progressing toward goals Additional ADL Goal #1: Pt will don/doff back brace with mod I sitting EOB in prep for ADL activity. ADL Goal: Additional Goal #1 - Progress: Progressing toward goals Miscellaneous OT Goals Miscellaneous OT Goal #1: Pt will perform bed mobility with mod I in prep for EOB ADLs, OT Goal: Miscellaneous Goal #1 - Progress: Progressing toward goals  OT Treatment Precautions/Restrictions   Precautions Precautions: Back Precaution Comments: Pt only able to recall 1/3 back precautions.  Reviewed all 3 precautions.   Required Braces or Orthoses: Yes Spinal Brace: Lumbar corset;Applied in sitting position Restrictions Weight Bearing Restrictions: No   ADL ADL Grooming: Performed;Moderate assistance Grooming Details (indicate cue type and reason): Pt with bil Le buckling with static standing. Pt states "thats what it was doing before I came in here" Pt with knee flexion and using BIL UE to maintain upright posture. pt requried Mod A to release sink and walker to wash hands. Pt performed hastily due to buckling. Where Assessed - Grooming: Standing at sink Toilet Transfer: Performed;Moderate assistance Toilet Transfer Details (indicate cue type and reason): due to BIL LE buckling with turning . pt able to ambulate in upright position and when slowed down or stopped buckles.  Toilet Transfer Method: Proofreader: Raised toilet seat with arms (or 3-in-1 over toilet) Toileting - Clothing Manipulation: Performed;Moderate assistance Where Assessed - Toileting Clothing Manipulation: Sit to stand from 3-in-1 or toilet Toileting - Hygiene: Not assessed (no void ) Where Assessed - Toileting Hygiene: Sit to stand from 3-in-1 or toilet Equipment Used: Rolling walker ADL Comments: Pt sleeping in back brace. pt educated to don brace with sitting and upright posture. Pt is to doff brace for all sleeping.Pt with knee buckling with static standing afffect all sink level adls. (poor recall of precautions reeducated.) Mobility  Bed Mobility Bed Mobility: Yes Rolling Right: 5: Supervision Rolling Right Details (indicate cue type and reason): Cues to reinforce back precautions.   Right Sidelying to Sit: 6: Modified independent (Device/Increase time);HOB elevated (comment degrees) (HOb 30 degrees) Sitting - Scoot to Edge of Bed: 6: Modified independent (Device/Increase  time) Sit to Supine: 5: Supervision;With rail;HOB elevated (comment degrees) (20 degrees) Transfers Transfers: Yes Sit to Stand:  From bed;With upper extremity assist;3: Mod assist Sit to Stand Details (indicate cue type and reason): Cues for hand placement. (A) for balance & steadiness.   Stand to Sit: 4: Min assist;With upper extremity assist;To bed Stand to Sit Details: Cues for hand placement & use of UE's to control descent.   Exercises    End of Session OT - End of Session Equipment Utilized During Treatment: Gait belt;Back brace Activity Tolerance: Patient limited by pain;Other (comment) (bil le buckling) Patient left: with call bell in reach;in bed Nurse Communication: Mobility status for transfers General Behavior During Session: [facility] for tasks performed Cognition: [facility] for tasks performed  Lucile Shutters  03/14/2011, 2:50 PM Pager: [phone]

## 2011-03-14 NOTE — Progress Notes (Signed)
CSW met with pt to provide bed offers. Pt shared that he will discuss the bed offers with family and then make a choice. CSW will continue to follow and facilitate discharge to SNF.   Dede Query, MSW, [facility] [phone]

## 2011-03-14 NOTE — Progress Notes (Signed)
Slow progress. OK to pursue SNF for short term rehab.

## 2011-03-14 NOTE — Progress Notes (Signed)
   CARE MANAGEMENT NOTE 03/14/2011  Patient:  BRENTIN, SHIN   Account Number:  [id number]  Date Initiated:  03/12/2011  Documentation initiated by:  [facility]  Subjective/Objective Assessment:   decompression and fusion L4/5     Action/Plan:   Anticipated DC Date:  03/14/2011   Anticipated DC Plan:  HOME W HOME HEALTH SERVICES      DC Planning Services  CM consult      Choice offered to / List presented to:     DME arranged  3-N-1  Levan Hurst      DME agency  [facility]        Status of service:  Completed, signed off Medicare Important Message given?   (If response is "NO", the following Medicare IM given date fields will be blank) Date Medicare IM given:   Date Additional Medicare IM given:    Discharge Disposition:  SKILLED NURSING FACILITY  Per UR Regulation:    Comments:  03-14-11 1647 Tomi Bamberger, RN,[phone] CM spoke to CSW and plan is for SNF. Bed offers given today.   03/12/2011 1200 Contacted [facility] for DME for home. Isidoro Donning RN CCM Case Mgmt phone [phone]

## 2011-03-15 MED ORDER — OXYCODONE HCL 15 MG PO TABS: 15.0000 mg | ORAL_TABLET | Freq: Four times a day (QID) | ORAL | Status: DC | PRN

## 2011-03-15 MED ORDER — CYCLOBENZAPRINE HCL 10 MG PO TABS: 10.0000 mg | ORAL_TABLET | Freq: Three times a day (TID) | ORAL | Status: AC | PRN

## 2011-03-15 MED ORDER — DIAZEPAM 5 MG PO TABS: 5.0000 mg | ORAL_TABLET | Freq: Four times a day (QID) | ORAL | Status: AC | PRN

## 2011-03-15 NOTE — Discharge Summary (Signed)
Physician Discharge Summary  Patient ID: Michael Bender MRN: [id number] DOB/AGE: Jan 17, 1946 66 y.o.  Admit date: 03/11/2011 Discharge date: 03/16/2011  Admission Diagnoses: lumbar stenosis synovial cyst lumbar spondylosis spondylolisthesis lumbar radiculoapthy L4/5   Discharge Diagnoses: lumbar stenosis synovial cyst lumbar spondylosis spondylolisthesis lumbar radiculoapthy L4/5 s/p POSTERIOR LUMBAR FUSION 1 LEVEL with resection of synovial cyst   Active Problems:  * No active hospital problems. *    Discharged Condition: good  Hospital Course: This 65y.o. Male was admitted with dx of lumbar stenosis synovial cyst lumbar spondylosis spondylolisthesis lumbar radiculoapthy L4/5, underwent uncomplicated POSTERIOR LUMBAR FUSION 1 LEVEL with resection of synovial cyst, and was transferred to the floor.  During his post-operative stay, he has progressed with the assistance of PT & OT for mobilization. He is now to be transferred to a skilled facility for rehabilitation.      Consults: None  Significant Diagnostic Studies: radiology: X-Ray: intra-operative  Treatments: surgery: POSTERIOR LUMBAR FUSION 1 LEVEL with resection of synovial cyst   Discharge Exam: Blood pressure 124/71, pulse 73, temperature 98.4 F (36.9 C), temperature source Oral, resp. rate 20, height 5\' 11"  (1.803 m), weight 84.1 kg (185 lb 6.5 oz), SpO2 96.00%. Alert, conversant. MAEW. Good strength BLE. Drsg intact, dry. Incision w/o erythema, swelling, or drainage.   Disposition: D/C to SNF. Follow up in office in 3-4 weeks.   Medication List  As of 03/15/2011 12:44 PM   TAKE these medications         amitriptyline 100 MG tablet   Commonly known as: ELAVIL   Take 100 mg by mouth 2 (two) times daily.      atenolol-chlorthalidone 50-25 MG per tablet   Commonly known as: TENORETIC   Take 0.5 tablets by mouth every morning.      cyclobenzaprine 10 MG tablet   Commonly known as: FLEXERIL   Take 10 mg by mouth  3 (three) times daily as needed. For muscle spasms      cyclobenzaprine 10 MG tablet   Commonly known as: FLEXERIL   Take 1 tablet (10 mg total) by mouth 3 (three) times daily as needed for muscle spasms.      diazepam 5 MG tablet   Commonly known as: VALIUM   Take 5 mg by mouth every 8 (eight) hours as needed. For muscle spasms / headache      diazepam 5 MG tablet   Commonly known as: VALIUM   Take 1 tablet (5 mg total) by mouth every 6 (six) hours as needed (muscle relaxer).      esomeprazole 40 MG capsule   Commonly known as: NEXIUM   Take 40 mg by mouth daily before breakfast.      gabapentin 300 MG capsule   Commonly known as: NEURONTIN   Take 300 mg by mouth 3 (three) times daily.      oxyCODONE 15 MG immediate release tablet   Commonly known as: ROXICODONE   Take 15 mg by mouth every 6 (six) hours as needed. For pain      oxyCODONE 15 MG immediate release tablet   Commonly known as: ROXICODONE   Take 1 tablet (15 mg total) by mouth every 6 (six) hours as needed for pain.      polyethylene glycol packet   Commonly known as: MIRALAX / GLYCOLAX   Take 17 g by mouth daily.      zolpidem 10 MG tablet   Commonly known as: AMBIEN   Take 10 mg by mouth at  bedtime.             Signed: Georgiann Cocker 03/15/2011, 12:44 PM

## 2011-03-15 NOTE — Progress Notes (Signed)
Subjective: Patient reports "I'm getting better!" "They talked to me about a rehab place. I just don't know which to pick."  Objective: Vital signs in last 24 hours: Temp:  [97.9 F (36.6 C)-98.8 F (37.1 C)] 98.5 F (36.9 C) (02/12 0503) Pulse Rate:  [71-90] 74  (02/12 0503) Resp:  [17-20] 17  (02/12 0503) BP: (92-126)/(46-74) 92/46 mmHg (02/12 0503) SpO2:  [94 %-95 %] 94 % (02/12 0503)  Intake/Output from previous day: 02/11 0701 - 02/12 0700 In: 1110 [P.O.:1110] Out: 600 [Urine:600] Intake/Output this shift:    Alert, conversant. MAEW. Good strength BLE. Drsg intact, dry. Incision w/o erythema, swelling, or drainage.  Lab Results: No results found for this basename: WBC:2,HGB:2,HCT:2,PLT:2 in the last 72 hours BMET No results found for this basename: NA:2,K:2,CL:2,CO2:2,GLUCOSE:2,BUN:2,CREATININE:2,CALCIUM:2 in the last 72 hours  Studies/Results: No results found.  Assessment/Plan: Improving  LOS: 4 days  Continue to mobilize in LSO. Planning to transfer to SNF when bed available.   Georgiann Cocker 03/15/2011, 8:16 AM

## 2011-03-15 NOTE — Progress Notes (Signed)
Clinical Social Worker met with pt and family to provide bed offers. Family is visiting facilities and will make a choice later today in anticipation for discharge tomorrow. CSW will continue to follow to facilitate discharge to SNF tomorrow.   Dede Query, MSW, [facility] [phone]

## 2011-03-15 NOTE — Progress Notes (Signed)
Occupational Therapy Treatment Patient Details Name: Michael Bender MRN: [id number] DOB: 1945/02/15 Today's Date: 03/15/2011  OT Assessment/Plan OT Assessment/Plan Comments on Treatment Session: Pt. continues with impaired balance - seems somewhat ataxic.  Pt. requires cues for back precautions.  Agree with SNF OT Plan: Discharge plan remains appropriate OT Frequency: Min 2X/week Follow Up Recommendations: Skilled nursing facility Equipment Recommended: Defer to next venue OT Goals ADL Goals ADL Goal: Grooming - Progress: Progressing toward goals ADL Goal: Toilet Transfer - Progress: Goal set today ADL Goal: Additional Goal #1 - Progress: Progressing toward goals Miscellaneous OT Goals OT Goal: Miscellaneous Goal #1 - Progress: Progressing toward goals  OT Treatment Precautions/Restrictions  Precautions Precautions: Back Precaution Comments: Pt. able to state "BAT" when asked about his precautions, but unable to state precautions, and requires mod verbal cues  Spinal Brace: Lumbar corset;Applied in sitting position Restrictions Weight Bearing Restrictions: No   ADL ADL Grooming: Performed;Wash/dry hands;Teeth care;Minimal assistance Grooming Details (indicate cue type and reason): Pt. with questionable ataxia - min A for balance Where Assessed - Grooming: Standing at sink Toilet Transfer: Performed;Minimal assistance Toilet Transfer Details (indicate cue type and reason): Requires mod verbal cues for safe walker placement Toilet Transfer Method: Ambulating Toilet Transfer Equipment: Raised toilet seat with arms (or 3-in-1 over toilet) Toileting - Clothing Manipulation: Performed;Moderate assistance Where Assessed - Toileting Clothing Manipulation: Standing Toileting - Hygiene: Not assessed Equipment Used: Rolling walker Ambulation Related to ADLs: Pt. unsteady, almost appears ataxic.  Min A required, and mod verbal cues for walker placement ADL Comments: Pt. with  questionable intermittent confusion - difficult to determine if pt. was joking or serious Mobility  Bed Mobility Bed Mobility: Yes Rolling Right: 6: Modified independent (Device/Increase time) Right Sidelying to Sit: 6: Modified independent (Device/Increase time);HOB flat Right Sidelying to Sit Details (indicate cue type and reason): min verbal cues for log rolling and precautions Sitting - Scoot to Edge of Bed: 6: Modified independent (Device/Increase time) Sit to Supine: 5: Supervision;With rail;HOB elevated (comment degrees) Transfers Transfers: Yes Sit to Stand: 4: Min assist;From bed;From chair/3-in-1;With armrests;With upper extremity assist Stand to Sit: To chair/3-in-1;With upper extremity assist;With armrests;Other (comment) Exercises    End of Session OT - End of Session Equipment Utilized During Treatment: Gait belt;Back brace Activity Tolerance: Patient limited by pain;Other (comment) Patient left: in bed;with call bell in reach;with family/visitor present Nurse Communication: Other (comment) (pain level) General Behavior During Session: [facility] for tasks performed Cognition: [facility] for tasks performed  Michael Bender, Ursula Alert M  03/15/2011, 5:34 PM

## 2011-03-15 NOTE — Progress Notes (Signed)
Patient feeling better and ready to go to SNF.

## 2011-03-15 NOTE — Progress Notes (Addendum)
Physical Therapy Treatment Patient Details Name: Michael Bender MRN: [id number] DOB: 14-Jun-1945 Today's Date: 03/15/2011  PT Assessment/Plan Pt continues to have back pain but willing to participate in PT session.  Pt reports that his feet feel cold "like they are in ice", however they are warm to the touch.   PT - Assessment/Plan PT Frequency: Min 5X/week Follow Up Recommendations: Skilled nursing facility Equipment Recommended: Defer to next venue PT Goals  Acute Rehab PT Goals PT Goal: Supine/Side to Sit - Progress: Met PT Goal: Sit to Stand - Progress: Not met PT Goal: Stand to Sit - Progress: Progressing toward goal PT Goal: Ambulate - Progress: Progressing toward goal Additional Goals PT Goal: Additional Goal #1 - Progress: Progressing toward goal  PT Treatment Precautions/Restrictions  Precautions Precautions: Back Precaution Comments: Continue to review all 3 back precautions due to pt only able to verbalize 1/3 precautions but does well with maintaining precautions during functional activity.   Required Braces or Orthoses: Yes Spinal Brace: Lumbar corset;Applied in sitting position Restrictions Weight Bearing Restrictions: No Mobility (including Balance) Bed Mobility Rolling Right: 6: Modified independent (Device/Increase time) Right Sidelying to Sit: 6: Modified independent (Device/Increase time);HOB flat Sitting - Scoot to Edge of Bed: 6: Modified independent (Device/Increase time) Transfers Sit to Stand: 4: Min assist;From bed;From chair/3-in-1;With armrests;With upper extremity assist Sit to Stand Details (indicate cue type and reason): Assist to achieve standing, balance, stabilization.   Stand to Sit: To chair/3-in-1;With upper extremity assist;With armrests;Other (comment) (Min Guard (A)) Stand to Sit Details: Cues for hand placement & use of UE's to control descent.  Ambulation/Gait Ambulation/Gait Assistance: Other (comment) (Min Guard (A)) Ambulation/Gait  Assistance Details (indicate cue type and reason): Cues for upright posture, stay inside RW, increase step/stride length.  When pt stops to rest static standing is mildly unsteady- begins to lean posteriorly & towards Rt side.   Ambulation Distance (Feet): 115 Feet Assistive device: Rolling walker Gait Pattern: Decreased step length - right;Decreased step length - left;Decreased stride length;Trunk flexed Stairs: No Wheelchair Mobility Wheelchair Mobility: No  Balance Balance Assessed: Yes Static Standing Balance Static Standing - Balance Support: No upper extremity supported Static Standing - Level of Assistance: 4: Min assist Static Standing - Comment/# of Minutes: Standing balance activities consisted of: standing with eyes open/closed, feet together/apart, tandem stance, perturbations at hips in all directions, standing with UE's out to side<>to front of body.   Exercise    End of Session PT - End of Session Equipment Utilized During Treatment: Gait belt;Back brace Activity Tolerance: Patient tolerated treatment well Patient left: in chair;with call bell in reach General Behavior During Session: [facility] for tasks performed Cognition: [facility] for tasks performed  [name] 03/15/2011, 11:02 AM [phone]

## 2011-03-16 MED FILL — Heparin Sodium (Porcine) Inj 1000 Unit/ML: INTRAMUSCULAR | Qty: 30 | Status: AC

## 2011-03-16 MED FILL — Sodium Chloride IV Soln 0.9%: INTRAVENOUS | Qty: 1000 | Status: AC

## 2011-03-16 NOTE — Progress Notes (Signed)
Physical Therapy Treatment Patient Details Name: Michael Bender MRN: [id number] DOB: Jun 30, 1945 Today's Date: 03/16/2011  PT Assessment/Plan  PT - Assessment/Plan Comments on Treatment Session: Pt moves fairly well.  Continues to have some standing balance defecits but able to perform functional activity at sink without physical assistance.   PT Frequency: Min 5X/week Follow Up Recommendations: Skilled nursing facility Equipment Recommended: Defer to next venue PT Goals  Acute Rehab PT Goals PT Goal: Sit to Stand - Progress: Met PT Goal: Stand to Sit - Progress: Met PT Goal: Ambulate - Progress: Progressing toward goal Additional Goals PT Goal: Additional Goal #1 - Progress: Progressing toward goal  PT Treatment Precautions/Restrictions  Precautions Precautions: Back Precaution Comments: Pt verbalized 2/3 back precautions- No bending & No arching.   Required Braces or Orthoses: Yes Spinal Brace: Lumbar corset;Applied in sitting position Restrictions Weight Bearing Restrictions: No Mobility (including Balance) Bed Mobility Bed Mobility: No Transfers Sit to Stand: 5: Supervision;From chair/3-in-1;With armrests;With upper extremity assist Sit to Stand Details (indicate cue type and reason): cues for hand placement.  Stand to Sit: 5: Supervision;To chair/3-in-1;With upper extremity assist;With armrests Stand to Sit Details: cues for hand placement.  Ambulation/Gait Ambulation/Gait Assistance: 5: Supervision Ambulation/Gait Assistance Details (indicate cue type and reason): Verbal & tactile Cues for upright posture.  Supervision for safety due to pt continues to have spasms & knees giving way when spasms happen.   Ambulation Distance (Feet): 200 Feet Assistive device: Rolling walker Gait Pattern: Trunk flexed Stairs: No Wheelchair Mobility Wheelchair Mobility: No  Dynamic Standing Balance Dynamic Standing - Balance Support: No upper extremity supported;During functional  activity Dynamic Standing - Level of Assistance: 5: Stand by assistance Dynamic Standing - Balance Activities: Lateral lean/weight shifting;Forward lean/weight shifting;Reaching for objects Dynamic Standing - Comments: Pt's knees slightly giving way & pt with mild sway but did not need any physical Assistance  Exercise    End of Session PT - End of Session Equipment Utilized During Treatment: Gait belt;Back brace Activity Tolerance: Patient tolerated treatment well Patient left: in chair;with call bell in reach (RN present to get pt ready for d/c) General Behavior During Session: [facility] for tasks performed Cognition: [facility] for tasks performed  Michael Bender 03/16/2011, 12:38 PM [phone]

## 2011-03-16 NOTE — Progress Notes (Signed)
Subjective: Patient reports doing well  Objective: Vital signs in last 24 hours: Temp:  [98.2 F (36.8 C)-98.8 F (37.1 C)] 98.2 F (36.8 C) (02/13 0645) Pulse Rate:  [73-85] 81  (02/13 0645) Resp:  [20-21] 20  (02/13 0645) BP: (97-149)/(57-73) 110/62 mmHg (02/13 0645) SpO2:  [93 %-96 %] 94 % (02/13 0645)  Intake/Output from previous day: 02/12 0701 - 02/13 0700 In: 1080 [P.O.:1080] Out: -  Intake/Output this shift:    Physical Exam: Full strength, Dressing CDI  Lab Results: No results found for this basename: WBC:2,HGB:2,HCT:2,PLT:2 in the last 72 hours BMET No results found for this basename: NA:2,K:2,CL:2,CO2:2,GLUCOSE:2,BUN:2,CREATININE:2,CALCIUM:2 in the last 72 hours  Studies/Results: No results found.  Assessment/Plan: DC to SNF    LOS: 5 days    Babetta Paterson D, MD 03/16/2011, 8:12 AM

## 2011-03-16 NOTE — Progress Notes (Signed)
Pt is ready for discharge to [facility] SNF. Facility has received discharge summary and is ready to admit pt. Pt and family are agreeable to discharge plan. [facility] will provide transportation to SNF. CSW is signing off.   Dede Query, MSW, Theresia Majors [phone]

## 2011-03-31 ENCOUNTER — Other Ambulatory Visit: Payer: Self-pay | Admitting: Family Medicine

## 2011-05-06 HISTORY — PX: SPINAL FUSION: SHX223

## 2011-07-15 ENCOUNTER — Encounter: Payer: Self-pay | Admitting: Nurse Practitioner

## 2011-07-15 ENCOUNTER — Ambulatory Visit (INDEPENDENT_AMBULATORY_CARE_PROVIDER_SITE_OTHER): Payer: Medicare Other | Admitting: Nurse Practitioner

## 2011-07-15 VITALS — BP 102/60 | HR 64 | Ht 71.0 in | Wt 188.8 lb

## 2011-07-15 DIAGNOSIS — R1319 Other dysphagia: Secondary | ICD-10-CM | POA: Insufficient documentation

## 2011-07-15 DIAGNOSIS — R1310 Dysphagia, unspecified: Secondary | ICD-10-CM

## 2011-07-15 NOTE — Patient Instructions (Addendum)
  Upper GI Endoscopy Upper GI endoscopy means using a flexible scope to look at the esophagus, stomach, and upper small bowel. This is done to make a diagnosis in people with heartburn, abdominal pain, or abnormal bleeding. Sometimes an endoscope is needed to remove foreign bodies or food that become stuck in the esophagus; it can also be used to take biopsy samples. For the best results, do not eat or drink for 8 hours before having your upper endoscopy.  To perform the endoscopy, you will probably be sedated and your throat will be numbed with a special spray. The endoscope is then slowly passed down your throat (this will not interfere with your breathing). An endoscopy exam takes 15 to 30 minutes to complete and there is no real pain. Patients rarely remember much about the procedure. The results of the test may take several days if a biopsy or other test is taken.  You may have a sore throat after an endoscopy exam. Serious complications are very rare. Stick to liquids and soft foods until your pain is better. Do not drive a car or operate any dangerous equipment for at least 24 hours after being sedated. SEEK IMMEDIATE MEDICAL CARE IF:   You have severe throat pain.   You have shortness of breath.   You have bleeding problems.   You have a fever.   You have difficulty recovering from your sedation.  Document Released: 02/25/2004 Document Revised: 01/06/2011 Document Reviewed: 01/20/2008 [employer] Patient Information 2012 [employer]ner at [facility] Endoscopy Unit. Directions provided. Date: 07-20-2011 Time: 9:50 Am : Arrival time is 8:00 Am .   We scheduled the Endoscopy with Dr. Stan Head on 07-20-2011. Location: [facility] Endoscopy Unit. Directions provided.

## 2011-07-15 NOTE — Progress Notes (Signed)
07/17/2011 Michael Bender [id number] 04/18/1945   HISTORY OF PRESENT ILLNESS: Patient is a 66 year old male referred by Dr. Todd for evaluation of dysphagia. The solid food dysphagia has been present for a couple of months but is becoming progressively worse. Patient is eating only soft foods. No associated weight loss. Patient has long-standing acid reflux but is asymptomatic on daily PPI therapy. He has chronic constipation managed with daily MiraLax.   Past Medical History  Diagnosis Date  . Pneumonia   . Hypertension   . Prostate disease   . Headache     last one 6 months ago  . Hemorrhoids, internal   . Arthritis     Back  . Blood transfusion     as a child  . Neck rigidity     post cervical fusion  . Nocturia COPD    Past Surgical History  Procedure Date  . Cervical fusion 1992    C2/C 3  four surgeries  . Spinal fusion 05/06/11    reports that he quit smoking about 4 months ago. He has never used smokeless tobacco. He reports that he does not drink alcohol or use illicit drugs. family history includes Heart disease in his mother and Pancreatic cancer in his father.  There is no history of Anesthesia problems. Allergies  Allergen Reactions  . Nubain (Nalbuphine Hcl)     Muscle contraction      Outpatient Encounter Prescriptions as of 07/15/2011  Medication Sig Dispense Refill  . amitriptyline (ELAVIL) 100 MG tablet Take 100 mg by mouth 2 (two) times daily.       . atenolol-chlorthalidone (TENORETIC) 50-25 MG per tablet Take 0.5 tablets by mouth every morning.      . cyclobenzaprine (FLEXERIL) 10 MG tablet Take 10 mg by mouth 3 (three) times daily as needed. For muscle spasms      . diazepam (VALIUM) 5 MG tablet Take 5 mg by mouth every 8 (eight) hours as needed. For muscle spasms / headache      . esomeprazole (NEXIUM) 40 MG capsule Take 40 mg by mouth daily before breakfast.      . oxyCODONE (ROXICODONE) 15 MG immediate release tablet Take 15 mg by mouth every 6  (six) hours as needed. For pain      . polyethylene glycol (MIRALAX / GLYCOLAX) packet Take 17 g by mouth daily.        . zolpidem (AMBIEN) 10 MG tablet Take 10 mg by mouth at bedtime.      . DISCONTD: gabapentin (NEURONTIN) 300 MG capsule Take 300 mg by mouth 3 (three) times daily.      . DISCONTD: NEXIUM 40 MG capsule TAKE ONE CAPSULE BY MOUTH EVERY DAY 30 MINUTES BEFORE FIRST MEAL OF THE DAY  90 capsule  2     REVIEW OF SYSTEMS  : . All other systems reviewed and negative except where noted in the History of Present Illness.   PHYSICAL EXAM: BP 102/60  Pulse 64  Ht 5' 11" (1.803 m)  Wt 188 lb 12.8 oz (85.639 kg)  BMI 26.33 kg/m2 General: Well developed white male in no acute distress Head: Normocephalic and atraumatic Eyes:  sclerae anicteric,conjunctive pink. Ears: Normal auditory acuity Mouth: No deformity or lesions Neck: Supple, no masses.  Lungs: Clear throughout to auscultation Heart: Regular rate and rhythm Abdomen: Soft, non distended, nontender. No masses or hepatomegaly noted. Normal Bowel sounds Musculoskeletal: Symmetrical with no gross deformities  Skin: No lesions on visible extremities   Extremities: No edema or deformities noted Neurological: Alert oriented , grossly nonfocal Cervical Nodes:  No significant cervical adenopathy Psychological:  Alert and cooperative. Normal mood and affect  ASSESSMENT AND PLAN;  1. 66-year-old male with a two-month history of progressive solid food dysphagia. Rule out esophageal stricture, eosinophilic esophagitis, neoplasm. For further evaluation patient will be scheduled for upper endoscopy. Dr. Gessner will do the procedure under conscious sedation. Patient does take roxicodone and valium as needed. Hopefully conscious sedation will be adequate. The benefits, risks, and potential complications of EGD with possible biopsies and/or dilation were discussed with the patient and he agrees to proceed. Between now and time of EGD patient  will continue soft diet, small bites and plenty of fluids with meals to avoid impaction.   . 2. long-standing GERD, asymptomatic on daily Nexium.  3. colon cancer screening. Last colonoscopy was September 2003. Patient for recall colonoscopy with  Dr. Gessner in September 2013.   4. Chronic pain syndrome. Patient takes roxicodone and valium as needed.  

## 2011-07-17 ENCOUNTER — Encounter: Payer: Self-pay | Admitting: Nurse Practitioner

## 2011-07-18 NOTE — Progress Notes (Signed)
Reviewed and agree with management. Israa Caban D. Gurinder Toral, M.D., FACG  

## 2011-07-20 ENCOUNTER — Encounter (HOSPITAL_COMMUNITY)
Admission: RE | Disposition: A | Discharge status: Home / Self Care | Payer: Self-pay | Source: Ambulatory Visit | Attending: Internal Medicine

## 2011-07-20 ENCOUNTER — Ambulatory Visit (HOSPITAL_COMMUNITY)
Admission: RE | Admit: 2011-07-20 | Discharge: 2011-07-20 | Disposition: A | Discharge status: Home / Self Care | Payer: Medicare Other | Source: Ambulatory Visit | Attending: Internal Medicine | Admitting: Internal Medicine

## 2011-07-20 ENCOUNTER — Encounter (HOSPITAL_COMMUNITY): Payer: Self-pay | Admitting: Internal Medicine

## 2011-07-20 DIAGNOSIS — Z79899 Other long term (current) drug therapy: Secondary | ICD-10-CM | POA: Insufficient documentation

## 2011-07-20 DIAGNOSIS — R1310 Dysphagia, unspecified: Secondary | ICD-10-CM | POA: Insufficient documentation

## 2011-07-20 DIAGNOSIS — J449 Chronic obstructive pulmonary disease, unspecified: Secondary | ICD-10-CM | POA: Insufficient documentation

## 2011-07-20 DIAGNOSIS — R1314 Dysphagia, pharyngoesophageal phase: Secondary | ICD-10-CM

## 2011-07-20 DIAGNOSIS — R1319 Other dysphagia: Secondary | ICD-10-CM

## 2011-07-20 DIAGNOSIS — G894 Chronic pain syndrome: Secondary | ICD-10-CM | POA: Insufficient documentation

## 2011-07-20 DIAGNOSIS — I10 Essential (primary) hypertension: Secondary | ICD-10-CM | POA: Insufficient documentation

## 2011-07-20 DIAGNOSIS — J4489 Other specified chronic obstructive pulmonary disease: Secondary | ICD-10-CM | POA: Insufficient documentation

## 2011-07-20 HISTORY — PX: ESOPHAGOGASTRODUODENOSCOPY: SHX5428

## 2011-07-20 HISTORY — PX: SAVORY DILATION: SHX5439

## 2011-07-20 SURGERY — EGD (ESOPHAGOGASTRODUODENOSCOPY)
Anesthesia: Moderate Sedation

## 2011-07-20 MED ORDER — SODIUM CHLORIDE 0.9 % IV SOLN
INTRAVENOUS | Status: DC
  Administered 2011-07-20: 500 mL via INTRAVENOUS

## 2011-07-20 MED ORDER — MIDAZOLAM HCL 10 MG/2ML IJ SOLN
INTRAMUSCULAR | Status: DC | PRN
  Administered 2011-07-20 (×5): 2 mg via INTRAVENOUS

## 2011-07-20 MED ORDER — FENTANYL CITRATE 0.05 MG/ML IJ SOLN
INTRAMUSCULAR | Status: AC
  Filled 2011-07-20: qty 4

## 2011-07-20 MED ORDER — DIPHENHYDRAMINE HCL 50 MG/ML IJ SOLN
INTRAMUSCULAR | Status: AC
  Filled 2011-07-20: qty 1

## 2011-07-20 MED ORDER — FENTANYL NICU IV SYRINGE 50 MCG/ML
INJECTION | INTRAMUSCULAR | Status: DC | PRN
  Administered 2011-07-20 (×3): 25 ug via INTRAVENOUS

## 2011-07-20 MED ORDER — MIDAZOLAM HCL 10 MG/2ML IJ SOLN
INTRAMUSCULAR | Status: AC
  Filled 2011-07-20: qty 4

## 2011-07-20 MED ORDER — DIPHENHYDRAMINE HCL 50 MG/ML IJ SOLN
INTRAMUSCULAR | Status: DC | PRN
  Administered 2011-07-20 (×2): 12.5 mg via INTRAVENOUS

## 2011-07-20 MED ORDER — BUTAMBEN-TETRACAINE-BENZOCAINE 2-2-14 % EX AERO
INHALATION_SPRAY | CUTANEOUS | Status: DC | PRN
  Administered 2011-07-20: 2 via TOPICAL

## 2011-07-20 NOTE — H&P (View-Only) (Signed)
07/17/2011 Michael Bender [id number] 04-Nov-1945   HISTORY OF PRESENT ILLNESS: Patient is a 66 year old male referred by Dr. Tawanna Cooler for evaluation of dysphagia. The solid food dysphagia has been present for a couple of months but is becoming progressively worse. Patient is eating only soft foods. No associated weight loss. Patient has long-standing acid reflux but is asymptomatic on daily PPI therapy. He has chronic constipation managed with daily MiraLax.   Past Medical History  Diagnosis Date  . Pneumonia   . Hypertension   . Prostate disease   . Headache     last one 6 months ago  . Hemorrhoids, internal   . Arthritis     Back  . Blood transfusion     as a child  . Neck rigidity     post cervical fusion  . Nocturia COPD    Past Surgical History  Procedure Date  . Cervical fusion 1992    C2/C 3  four surgeries  . Spinal fusion 05/06/11    reports that he quit smoking about 4 months ago. He has never used smokeless tobacco. He reports that he does not drink alcohol or use illicit drugs. family history includes Heart disease in his mother and Pancreatic cancer in his father.  There is no history of Anesthesia problems. Allergies  Allergen Reactions  . Nubain (Nalbuphine Hcl)     Muscle contraction      Outpatient Encounter Prescriptions as of 07/15/2011  Medication Sig Dispense Refill  . amitriptyline (ELAVIL) 100 MG tablet Take 100 mg by mouth 2 (two) times daily.       Marland Kitchen atenolol-chlorthalidone (TENORETIC) 50-25 MG per tablet Take 0.5 tablets by mouth every morning.      . cyclobenzaprine (FLEXERIL) 10 MG tablet Take 10 mg by mouth 3 (three) times daily as needed. For muscle spasms      . diazepam (VALIUM) 5 MG tablet Take 5 mg by mouth every 8 (eight) hours as needed. For muscle spasms / headache      . esomeprazole (NEXIUM) 40 MG capsule Take 40 mg by mouth daily before breakfast.      . oxyCODONE (ROXICODONE) 15 MG immediate release tablet Take 15 mg by mouth every 6  (six) hours as needed. For pain      . polyethylene glycol (MIRALAX / GLYCOLAX) packet Take 17 g by mouth daily.        Marland Kitchen zolpidem (AMBIEN) 10 MG tablet Take 10 mg by mouth at bedtime.      Marland Kitchen DISCONTD: gabapentin (NEURONTIN) 300 MG capsule Take 300 mg by mouth 3 (three) times daily.      Marland Kitchen DISCONTD: NEXIUM 40 MG capsule TAKE ONE CAPSULE BY MOUTH EVERY DAY 30 MINUTES BEFORE FIRST MEAL OF THE DAY  90 capsule  2     REVIEW OF SYSTEMS  : . All other systems reviewed and negative except where noted in the History of Present Illness.   PHYSICAL EXAM: BP 102/60  Pulse 64  Ht 5\' 11"  (1.803 m)  Wt 188 lb 12.8 oz (85.639 kg)  BMI 26.33 kg/m2 General: Well developed white male in no acute distress Head: Normocephalic and atraumatic Eyes:  sclerae anicteric,conjunctive pink. Ears: Normal auditory acuity Mouth: No deformity or lesions Neck: Supple, no masses.  Lungs: Clear throughout to auscultation Heart: Regular rate and rhythm Abdomen: Soft, non distended, nontender. No masses or hepatomegaly noted. Normal Bowel sounds Musculoskeletal: Symmetrical with no gross deformities  Skin: No lesions on visible extremities  Extremities: No edema or deformities noted Neurological: Alert oriented , grossly nonfocal Cervical Nodes:  No significant cervical adenopathy Psychological:  Alert and cooperative. Normal mood and affect  ASSESSMENT AND PLAN;  42. 67 year old male with a two-month history of progressive solid food dysphagia. Rule out esophageal stricture, eosinophilic esophagitis, neoplasm. For further evaluation patient will be scheduled for upper endoscopy. Dr. [name] will do the procedure under conscious sedation. Patient does take roxicodone and valium as needed. Hopefully conscious sedation will be adequate. The benefits, risks, and potential complications of EGD with possible biopsies and/or dilation were discussed with the patient and he agrees to proceed. Between now and time of EGD patient  will continue soft diet, small bites and plenty of fluids with meals to avoid impaction.   . 2. long-standing GERD, asymptomatic on daily Nexium.  3. colon cancer screening. Last colonoscopy was September 2003. Patient for recall colonoscopy with  Dr. [name] in September 2013.   4. Chronic pain syndrome. Patient takes roxicodone and valium as needed.

## 2011-07-20 NOTE — Discharge Instructions (Addendum)
Endoscopy Care After Please read the instructions outlined below and refer to this sheet in the next few weeks. These discharge instructions provide you with general information on caring for yourself after you leave the hospital. Your doctor may also give you specific instructions. While your treatment has been planned according to the most current medical practices available, unavoidable complications occasionally occur. If you have any problems or questions after discharge, please call your doctor. HOME CARE INSTRUCTIONS Activity  You may resume your regular activity but move at a slower pace for the next 24 hours.   Take frequent rest periods for the next 24 hours.   Walking will help expel (get rid of) the air and reduce the bloated feeling in your abdomen.   No driving for 24 hours (because of the anesthesia (medicine) used during the test).   You may shower.   Do not sign any important legal documents or operate any machinery for 24 hours (because of the anesthesia used during the test).  Nutrition  Drink plenty of fluids.   You may resume your normal diet.   Begin with a light meal and progress to your normal diet.   Avoid alcoholic beverages for 24 hours or as instructed by your caregiver.  Medications You may resume your normal medications unless your caregiver tells you otherwise. What you can expect today  You may experience abdominal discomfort such as a feeling of fullness or "gas" pains.   You may experience a sore throat for 2 to 3 days. This is normal. Gargling with salt water may help this.  Follow-up Your doctor will discuss the results of your test with you. SEEK IMMEDIATE MEDICAL CARE IF:  You have excessive nausea (feeling sick to your stomach) and/or vomiting.   You have severe abdominal pain and distention (swelling).   You have trouble swallowing.   You have a temperature over 100 F (37.8 C).   You have rectal bleeding or vomiting of blood.    Document Released: 09/01/2003 Document Revised: 01/06/2011 Document Reviewed: 03/14/2007 [facility] Patient Information 2012 [place], [place].    The exam was normal. I dilated the esophagus to see if it will help your swallowing. Follow the special diet instructions today also. [name], MD, [facility]

## 2011-07-20 NOTE — Op Note (Signed)
[facility] [address] [place], [place]  [address]  ENDOSCOPY PROCEDURE REPORT  PATIENT:  Michael, Bender  MR#:  [id number] BIRTHDATE:  January 31, 1946, 65 yrs. old  GENDER:  male  ENDOSCOPIST:  [name], MD, [facility]  PROCEDURE DATE:  07/20/2011 PROCEDURE:  EGD, diagnostic, Maloney Dilation of the Esophagus ASA CLASS:  Class III INDICATIONS:  1) dysphagia  MEDICATIONS:   These medications were titrated to patient response per physician's verbal order, Fentanyl 75 mcg IV, Versed 10 mg IV, Benadryl 25 mg IV TOPICAL ANESTHETIC:  Cetacaine Spray  DESCRIPTION OF PROCEDURE:   After the risks benefits and alternatives of the procedure were thoroughly explained, informed consent was obtained.  The  endoscope was introduced through the mouth and advanced to the second portion of the duodenum, without limitations.  The instrument was slowly withdrawn as the mucosa was carefully examined. <<PROCEDUREIMAGES>>  The upper, middle, and distal third of the esophagus were carefully inspected and no abnormalities were noted. The z-line was well seen at the GEJ. The endoscope was pushed into the fundus which was normal including a retroflexed view. The antrum, first and second part of the duodenum were unremarkable. Some mild gastric retention (on narcotics) - able to suction the fluid and debris.    Dilation was then performed at the total esophagus  1) Dilator:  Elease Hashimoto  Size(s):  54 French (18 mm) Resistance:  minimal  Heme:  none Appearance:  COMPLICATIONS:  None  ENDOSCOPIC IMPRESSION: 1) Normal EGD - dilated to 56 French due to dysphagia RECOMMENDATIONS: 1) clear liquids until noon then soft diet today 2) normal diet tomorrow 3) If swallowing no better in 4 weeks call for appointment  REPEAT EXAM:  No  [name], MD, [facility]  CC:  The Patient and Roderick Pee, MD  n. Rosalie Doctor:   [name] at 07/20/2011 10:30 AM  Quincy Carnes, [phone]

## 2011-07-20 NOTE — Interval H&P Note (Signed)
History and Physical Interval Note:  07/20/2011 9:48 AM  Michael Bender  has presented today for surgery, with the diagnosis of 787.20 Dysphagia  The various methods of treatment have been discussed with the patient and family. After consideration of risks, benefits and other options for treatment, the patient has consented to  Procedure(s) (LRB): ESOPHAGOGASTRODUODENOSCOPY (EGD) (N/A) SAVORY DILATION (N/A) as a surgical intervention .  The patient's history has been reviewed, patient examined, no change in status, stable for surgery.  I have reviewed the patients' chart and labs.  Questions were answered to the patient's satisfaction.     [name], MD, [facility]

## 2011-07-21 ENCOUNTER — Encounter (HOSPITAL_COMMUNITY): Payer: Self-pay | Admitting: Internal Medicine

## 2011-09-05 ENCOUNTER — Encounter: Payer: Self-pay | Admitting: Internal Medicine

## 2012-01-16 ENCOUNTER — Other Ambulatory Visit: Payer: Self-pay | Admitting: Family Medicine

## 2012-05-24 ENCOUNTER — Encounter: Payer: Self-pay | Admitting: Internal Medicine

## 2013-01-17 ENCOUNTER — Other Ambulatory Visit: Payer: Self-pay | Admitting: Family Medicine

## 2013-04-05 ENCOUNTER — Telehealth: Payer: Self-pay | Admitting: Family Medicine

## 2013-04-05 DIAGNOSIS — H539 Unspecified visual disturbance: Secondary | ICD-10-CM

## 2013-04-05 NOTE — Telephone Encounter (Signed)
Please place an order for Dr Laurance Flatten (NPI [id number]) Opthalmology ICD-9 366.16 Cpt 02111 DOS; 2/25

## 2014-04-09 ENCOUNTER — Ambulatory Visit (INDEPENDENT_AMBULATORY_CARE_PROVIDER_SITE_OTHER): Payer: Commercial Managed Care - HMO | Admitting: Family Medicine

## 2014-04-09 ENCOUNTER — Encounter: Payer: Self-pay | Admitting: Family Medicine

## 2014-04-09 VITALS — BP 110/70 | Temp 98.2°F | Ht 71.0 in | Wt 181.0 lb

## 2014-04-09 DIAGNOSIS — N401 Enlarged prostate with lower urinary tract symptoms: Secondary | ICD-10-CM | POA: Diagnosis not present

## 2014-04-09 DIAGNOSIS — I10 Essential (primary) hypertension: Secondary | ICD-10-CM

## 2014-04-09 DIAGNOSIS — R351 Nocturia: Secondary | ICD-10-CM | POA: Diagnosis not present

## 2014-04-09 DIAGNOSIS — Z23 Encounter for immunization: Secondary | ICD-10-CM

## 2014-04-09 LAB — BASIC METABOLIC PANEL
BUN: 7 mg/dL (ref 6–23)
CO2: 33 mEq/L — ABNORMAL HIGH (ref 19–32)
Calcium: 9.3 mg/dL (ref 8.4–10.5)
Chloride: 98 mEq/L (ref 96–112)
Creatinine, Ser: 0.88 mg/dL (ref 0.40–1.50)
GFR: 91.16 mL/min (ref >60.00)
Glucose, Bld: 100 mg/dL — ABNORMAL HIGH (ref 70–99)
Potassium: 3.8 mEq/L (ref 3.5–5.1)
Sodium: 135 mEq/L (ref 135–145)

## 2014-04-09 LAB — CBC WITH DIFFERENTIAL/PLATELET
Basophils Absolute: 0 10*3/uL (ref 0.0–0.1)
Basophils Relative: 0.6 % (ref 0.0–3.0)
Eosinophils Absolute: 0.1 10*3/uL (ref 0.0–0.7)
Eosinophils Relative: 1.5 % (ref 0.0–5.0)
HCT: 41 % (ref 39.0–52.0)
Hemoglobin: 14.2 g/dL (ref 13.0–17.0)
Lymphocytes Relative: 24.6 % (ref 12.0–46.0)
Lymphs Abs: 1.8 10*3/uL (ref 0.7–4.0)
MCHC: 34.7 g/dL (ref 30.0–36.0)
MCV: 91.1 fl (ref 78.0–100.0)
Monocytes Absolute: 0.6 10*3/uL (ref 0.1–1.0)
Monocytes Relative: 8.3 % (ref 3.0–12.0)
Neutro Abs: 4.8 10*3/uL (ref 1.4–7.7)
Neutrophils Relative %: 65 % (ref 43.0–77.0)
Platelets: 330 10*3/uL (ref 150.0–400.0)
RBC: 4.49 Mil/uL (ref 4.22–5.81)
RDW: 13.8 % (ref 11.5–15.5)
WBC: 7.4 10*3/uL (ref 4.0–10.5)

## 2014-04-09 LAB — POCT URINALYSIS DIPSTICK
Bilirubin, UA: NEGATIVE
Blood, UA: NEGATIVE
Glucose, UA: NEGATIVE
Ketones, UA: NEGATIVE
Leukocytes, UA: NEGATIVE
Nitrite, UA: NEGATIVE
Protein, UA: NEGATIVE
Spec Grav, UA: 1.01
Urobilinogen, UA: 0.2
pH, UA: 7

## 2014-04-09 LAB — PSA: PSA: 0.49 ng/mL (ref 0.10–4.00)

## 2014-04-09 LAB — TSH: TSH: 1.06 u[IU]/mL (ref 0.35–4.50)

## 2014-04-09 MED ORDER — ATENOLOL-CHLORTHALIDONE 50-25 MG PO TABS: 0.5000 | ORAL_TABLET | Freq: Every morning | ORAL | Status: DC

## 2014-04-09 NOTE — Patient Instructions (Addendum)
Our gastroenterologist now band hemorrhoids,,,,,,, [phone] asked for GI if you would like that done  Continue the Tenoretic,,,,,, one half tab daily  Follow-up in one year sooner if any problems  Dr. Gloriann Loan............ DDS...... to check your dentures

## 2014-04-09 NOTE — Progress Notes (Signed)
   Subjective:    Patient ID: Michael Bender, male    DOB: [birth year], 69 y.o.   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 He sees Dr. Kristin Bruins in [place]. He's on Elavil 100 mg twice a day, Valium 5 mg 3 times daily, oxycodone 15 mg 3 times daily, MiraLAX for constipation, and baclofen twice a day. He's functioning well and walks 2-3 times daily. He had another surgical procedure last year by neurosurgery and that is seem to help.  He gets routine eye care,,,,, bilateral cataracts but sees okay,,,,,, no dental care,,, colonoscopy 2013 normal  Vaccinations up-to-date by Apolonio Schneiders   Review of Systems  Constitutional: Negative.   HENT: Negative.   Eyes: Negative.   Respiratory: Negative.   Cardiovascular: Negative.   Gastrointestinal: Negative.   Endocrine: Negative.   Genitourinary: Negative.   Musculoskeletal: Negative.   Skin: Negative.   Allergic/Immunologic: Negative.   Neurological: Negative.   Hematological: Negative.   Psychiatric/Behavioral: Negative.        Objective:   Physical Exam  Constitutional: He is oriented to person, place, and time. He appears well-developed and well-nourished.  HENT:  Head: Normocephalic and atraumatic.  Right Ear: External ear normal.  Left Ear: External ear normal.  Nose: Nose normal.  Mouth/Throat: Oropharynx is clear and moist.  Eyes: Conjunctivae and EOM are normal. Pupils are equal, round, and reactive to light.  Neck: Normal range of motion. Neck supple. No JVD present. No tracheal deviation present. No thyromegaly present.  Cardiovascular: Normal rate, regular rhythm, normal heart sounds and intact distal pulses.  Exam reveals no gallop and no friction rub.   No murmur heard. No carotid neurologic  bruits peripheral pulses 2+ and symmetrical  Pulmonary/Chest: Effort normal and breath sounds normal. No stridor. No respiratory distress. He has no wheezes. He has no rales. He exhibits no tenderness.  Abdominal: Soft. Bowel sounds are normal. He exhibits no distension and no mass. There is no tenderness. There is no rebound and no guarding.  Genitourinary: Rectum normal, prostate normal and penis normal. Guaiac negative stool. No penile tenderness.  Irritated hemorrhoids. He's had a stapled before but they're bothering her a lot now. He has trouble with constipation from the narcotics. That's why takes the MiraLAX however even with fatty has hemorrhoidal problems.  Musculoskeletal: Normal range of motion. He exhibits no edema or tenderness.  Lymphadenopathy:    He has no cervical adenopathy.  Neurological: He is alert and oriented to person, place, and time. He has normal reflexes. No cranial nerve deficit. He exhibits normal muscle tone.  Skin: Skin is warm and dry. No rash noted. No erythema. No pallor.  Total body skin exam normal except for scars back from previous back surgery  Psychiatric: He has a normal mood and affect. His behavior is normal. Judgment and thought content normal.  Nursing note and vitals reviewed.         Assessment & Plan:  Hypertension ago continue current therapy  Chronic pain syndrome............. treated by his pain physician Dr. Kristin Bruins in [place]  Hemorrhoids irritated.......... GI consult when necessary

## 2014-04-09 NOTE — Progress Notes (Signed)
Pre visit review using our clinic review tool, if applicable. No additional management support is needed unless otherwise documented below in the visit note. 

## 2014-04-10 ENCOUNTER — Telehealth: Payer: Self-pay | Admitting: Family Medicine

## 2014-04-10 NOTE — Telephone Encounter (Signed)
emmi mailed  °

## 2015-04-16 ENCOUNTER — Other Ambulatory Visit: Payer: Self-pay | Admitting: Family Medicine

## 2015-09-08 ENCOUNTER — Other Ambulatory Visit (INDEPENDENT_AMBULATORY_CARE_PROVIDER_SITE_OTHER): Payer: Commercial Managed Care - HMO

## 2015-09-08 DIAGNOSIS — Z0000 Encounter for general adult medical examination without abnormal findings: Secondary | ICD-10-CM

## 2015-09-08 DIAGNOSIS — R7989 Other specified abnormal findings of blood chemistry: Secondary | ICD-10-CM | POA: Diagnosis not present

## 2015-09-08 LAB — HEPATIC FUNCTION PANEL
ALT: 5 U/L (ref 0–53)
AST: 20 U/L (ref 0–37)
Albumin: 4 g/dL (ref 3.5–5.2)
Alkaline Phosphatase: 103 U/L (ref 39–117)
Bilirubin, Direct: 0.1 mg/dL (ref 0.0–0.3)
Total Bilirubin: 0.4 mg/dL (ref 0.2–1.2)
Total Protein: 6.9 g/dL (ref 6.0–8.3)

## 2015-09-08 LAB — CBC WITH DIFFERENTIAL/PLATELET
Basophils Absolute: 0 10*3/uL (ref 0.0–0.1)
Basophils Relative: 0.4 % (ref 0.0–3.0)
Eosinophils Absolute: 0 10*3/uL (ref 0.0–0.7)
Eosinophils Relative: 0.2 % (ref 0.0–5.0)
HCT: 38.4 % — ABNORMAL LOW (ref 39.0–52.0)
Hemoglobin: 13.2 g/dL (ref 13.0–17.0)
Lymphocytes Relative: 17.9 % (ref 12.0–46.0)
Lymphs Abs: 1.5 10*3/uL (ref 0.7–4.0)
MCHC: 34.4 g/dL (ref 30.0–36.0)
MCV: 91.1 fl (ref 78.0–100.0)
Monocytes Absolute: 0.5 10*3/uL (ref 0.1–1.0)
Monocytes Relative: 5.3 % (ref 3.0–12.0)
Neutro Abs: 6.5 10*3/uL (ref 1.4–7.7)
Neutrophils Relative %: 76.2 % (ref 43.0–77.0)
Platelets: 309 10*3/uL (ref 150.0–400.0)
RBC: 4.21 Mil/uL — ABNORMAL LOW (ref 4.22–5.81)
RDW: 13.2 % (ref 11.5–15.5)
WBC: 8.6 10*3/uL (ref 4.0–10.5)

## 2015-09-08 LAB — BASIC METABOLIC PANEL
BUN: 3 mg/dL — ABNORMAL LOW (ref 6–23)
CO2: 30 mEq/L (ref 19–32)
Calcium: 9.2 mg/dL (ref 8.4–10.5)
Chloride: 89 mEq/L — ABNORMAL LOW (ref 96–112)
Creatinine, Ser: 0.78 mg/dL (ref 0.40–1.50)
GFR: 104.35 mL/min (ref >60.00)
Glucose, Bld: 142 mg/dL — ABNORMAL HIGH (ref 70–99)
Potassium: 3.1 mEq/L — ABNORMAL LOW (ref 3.5–5.1)
Sodium: 129 mEq/L — ABNORMAL LOW (ref 135–145)

## 2015-09-08 LAB — POC URINALSYSI DIPSTICK (AUTOMATED)
Bilirubin, UA: NEGATIVE
Blood, UA: NEGATIVE
Glucose, UA: NEGATIVE
Ketones, UA: NEGATIVE
Leukocytes, UA: NEGATIVE
Nitrite, UA: NEGATIVE
Protein, UA: NEGATIVE
Spec Grav, UA: 1.01
Urobilinogen, UA: 0.2
pH, UA: 7

## 2015-09-08 LAB — LIPID PANEL
Cholesterol: 170 mg/dL (ref 0–200)
HDL: 35.4 mg/dL — ABNORMAL LOW (ref >39.00)
NonHDL: 134.72
Total CHOL/HDL Ratio: 5
Triglycerides: 215 mg/dL — ABNORMAL HIGH (ref 0.0–149.0)
VLDL: 43 mg/dL — ABNORMAL HIGH (ref 0.0–40.0)

## 2015-09-08 LAB — LDL CHOLESTEROL, DIRECT: Direct LDL: 102 mg/dL

## 2015-09-08 LAB — PSA: PSA: 0.49 ng/mL (ref 0.10–4.00)

## 2015-09-08 LAB — TSH: TSH: 1.25 u[IU]/mL (ref 0.35–4.50)

## 2015-09-10 ENCOUNTER — Ambulatory Visit (INDEPENDENT_AMBULATORY_CARE_PROVIDER_SITE_OTHER): Payer: Commercial Managed Care - HMO | Admitting: Family Medicine

## 2015-09-10 ENCOUNTER — Encounter: Payer: Self-pay | Admitting: Family Medicine

## 2015-09-10 VITALS — BP 148/78 | HR 82 | Temp 97.6°F | Ht 70.0 in | Wt 165.5 lb

## 2015-09-10 DIAGNOSIS — Z720 Tobacco use: Secondary | ICD-10-CM | POA: Diagnosis not present

## 2015-09-10 DIAGNOSIS — R634 Abnormal weight loss: Secondary | ICD-10-CM | POA: Diagnosis not present

## 2015-09-10 DIAGNOSIS — F17200 Nicotine dependence, unspecified, uncomplicated: Secondary | ICD-10-CM

## 2015-09-10 MED ORDER — ATENOLOL 50 MG PO TABS: 50.0000 mg | ORAL_TABLET | Freq: Every day | ORAL | 3 refills | Status: DC

## 2015-09-10 NOTE — Patient Instructions (Signed)
Stop the Tenoretic.......... start Tenormin 50 mg 1 daily..... Follow-up in 4 weeks  Decrease your tobacco intake by one cigarette per day......... currently in 10....... down to 6 in 4 weeks  Call your insurance company to find awaiting get the shingles vaccine  We will put in a request for screening colonoscopy  Would recommend you get your general dentures check yearly.......Marland Kitchen Dr. Gloriann Loan DDS

## 2015-09-10 NOTE — Progress Notes (Signed)
Michael Bender is a 70 year old single male smoker who comes in today for evaluation of weight loss  He states over the past 3-4 weeks he's noticed weight loss. His last weight here was about 15 months ago he weighed 181 pounds. Current weight is 165. He denies any fever chills night sweats earache sore throat cough nausea vomiting diarrhea. He does have some urinary tract symptoms but they're not new. He's got nocturia 1 and has decreased stream but that's been going on for quite a long period of time.  Dietary review was negative except he stopped eating ice cream and his son is giving him some vitamin supplements for the past week  Review of systems otherwise negative  Tetanus booster 2015. He's never had a shingles vaccine. He's due for follow-up colonoscopy.Marland Kitchen He thinks he had a colonoscopy about 12:15 years ago. It was normal. He's got a recall letters but ignored them. He states his known no change in his bowel habits bleeding etc.  He takes Tenoretic 50-25 one half tab daily . His laboratory data shows decreased sodium and potassium. Sodium is 129 potassium 3.1. Blood pressure today is 148/78. He states he's been compliant with his medication  His other meds are written by his chronic pain doctor in [facility]. He takes Elavil 100 mg 2 tabs at bedtime, Flexeril 10 mg 3 times a day, Valium 5 mg 3 times a day, oxycodone code on 15 mg every 6 hours for pain. He also takes MiraLAX because of the chronic constipation from the pain medication.  Well-developed well-nourished male no acute distress vital signs stable he is afebrile HEENT were negative. He says he has cataracts but his cataracts are minimal and really not problematic. I think he just needs new glasses. He does have upper and lower dentures. He doesn't have him checked. Neck exam was normal carotids were 2+ and symmetrical no bruias.  Pulmonary exam shows symmetrical breasts sounds and wheezing bilaterally. Cardiac exam PMI was in the fifth  intercostal space midclavicular line no heaves or thrills S1-S2 within normal limits S2 2 is physiologically split. I can appreciate no murmurs rubs or gallops. Abdominal exam flat bowel sounds normal liver spleen kidneys not enlarged no masses. Genital exam normal rectal exams to guaiac negative prostate 1+ symmetrical nonnodular BPH  Extremities normal except for bruising of his arms. He states he takes a BC daily. Peripheral pulses again 2+ and symmetrical  Weight loss.........Marland Kitchen probably related to decreasing caloric consumption  Hypertension not at goal and hyponatremia and hypokalemia........Marland Kitchen DC Tenoretic start Tenormin 50 mg daily. BP daily at home. Follow-up in 4 weeks  Tobacco abuse,,,,,, again counseled about smoking cessation. He states he's down to 10 cigarettes per day. Advised to decrease by one per week.  Decreased hearing,,,,,,,,, cannot afford a hearing aid  Chronic pain,,,,,,,,, continue follow-up by ophthalmologist.  BPH,,,,,, asymptomatic except for some slight nocturia 1,.Marland Kitchen

## 2015-09-10 NOTE — Progress Notes (Signed)
Pre visit review using our clinic review tool, if applicable. No additional management support is needed unless otherwise documented below in the visit note. 

## 2015-10-13 ENCOUNTER — Ambulatory Visit (INDEPENDENT_AMBULATORY_CARE_PROVIDER_SITE_OTHER): Payer: Commercial Managed Care - HMO | Admitting: Family Medicine

## 2015-10-13 ENCOUNTER — Encounter: Payer: Self-pay | Admitting: Family Medicine

## 2015-10-13 VITALS — BP 140/80 | HR 67 | Temp 98.4°F | Wt 168.2 lb

## 2015-10-13 DIAGNOSIS — I10 Essential (primary) hypertension: Secondary | ICD-10-CM

## 2015-10-13 LAB — BASIC METABOLIC PANEL
BUN: 4 mg/dL — ABNORMAL LOW (ref 6–23)
CO2: 35 mEq/L — ABNORMAL HIGH (ref 19–32)
Calcium: 8.8 mg/dL (ref 8.4–10.5)
Chloride: 103 mEq/L (ref 96–112)
Creatinine, Ser: 0.99 mg/dL (ref 0.40–1.50)
GFR: 79.23 mL/min (ref >60.00)
Glucose, Bld: 81 mg/dL (ref 70–99)
Potassium: 3.6 mEq/L (ref 3.5–5.1)
Sodium: 141 mEq/L (ref 135–145)

## 2015-10-13 NOTE — Patient Instructions (Signed)
Continue current medication  With the new blood pressure cuff check your blood pressure Monday Wednesday Friday morning for 4 weeks  After month we will review the data to be sure your blood pressure remains normal  Labs today,,,,,,,,, I'll call you the report

## 2015-10-13 NOTE — Progress Notes (Signed)
Michael Bender is a 70 year old single male smoker done to 10 cigarettes a day who comes in today for reevaluation of hypertension hypokalemia and hyponatremia  We saw him a month ago for general physical examination. That time his serum sodium potassium were low. We stopped the Tenoretic and switched him to plain Tenormin. He's not been monitoring his blood pressure at home. He says he feels well and has no complaints  Physical examination vital signs stable he's afebrile BP right arm sitting position 140/70  Impression  #1 hypertension at goal......... continue current therapy  #2. History of hyponatremia and hypokalemia,,,,,,,, recheck electrolytes

## 2015-10-13 NOTE — Progress Notes (Signed)
Pre visit review using our clinic review tool, if applicable. No additional management support is needed unless otherwise documented below in the visit note. 

## 2015-12-01 DIAGNOSIS — H2513 Age-related nuclear cataract, bilateral: Secondary | ICD-10-CM | POA: Diagnosis not present

## 2015-12-08 ENCOUNTER — Encounter: Payer: Self-pay | Admitting: Family Medicine

## 2015-12-08 ENCOUNTER — Ambulatory Visit (INDEPENDENT_AMBULATORY_CARE_PROVIDER_SITE_OTHER): Payer: Commercial Managed Care - HMO | Admitting: Family Medicine

## 2015-12-08 VITALS — BP 142/70 | HR 69 | Temp 98.3°F | Wt 168.4 lb

## 2015-12-08 DIAGNOSIS — L03116 Cellulitis of left lower limb: Secondary | ICD-10-CM | POA: Diagnosis not present

## 2015-12-08 MED ORDER — CEPHALEXIN 500 MG PO CAPS: ORAL_CAPSULE | ORAL | 1 refills | Status: DC

## 2015-12-08 NOTE — Patient Instructions (Signed)
Soaking her foot in warm water twice daily for 15-20 minutes  Keflex 500 mg........... 2 tabs twice daily for 1 week  Return when necessary

## 2015-12-08 NOTE — Progress Notes (Signed)
Pre visit review using our clinic review tool, if applicable. No additional management support is needed unless otherwise documented below in the visit note. 

## 2015-12-08 NOTE — Progress Notes (Signed)
Michael Bender is a 70 year old male smoker who comes in today for evaluation of pain in his right foot for one week  A week ago he dropped some wood on his right foot. He did have a boot on. Yesterday he noticed the right fifth toe draining some pus. He's had no fever chills  Review of systems otherwise negative  Physical exam vital signs stable he is afebrile examination of foot shows an abrasion over the second and third toe dorsal. Also is here is a little erythema and pus the right fifth toe dorsum.  Impression contusion with abrasion now secondary infection........... warm soaks twice a day Keflex 1 g twice a day

## 2016-03-03 DIAGNOSIS — H04123 Dry eye syndrome of bilateral lacrimal glands: Secondary | ICD-10-CM | POA: Diagnosis not present

## 2016-03-03 DIAGNOSIS — H25812 Combined forms of age-related cataract, left eye: Secondary | ICD-10-CM | POA: Diagnosis not present

## 2016-03-03 DIAGNOSIS — H25813 Combined forms of age-related cataract, bilateral: Secondary | ICD-10-CM | POA: Diagnosis not present

## 2016-03-03 DIAGNOSIS — H25811 Combined forms of age-related cataract, right eye: Secondary | ICD-10-CM | POA: Diagnosis not present

## 2016-03-10 DIAGNOSIS — H2512 Age-related nuclear cataract, left eye: Secondary | ICD-10-CM | POA: Diagnosis not present

## 2016-03-10 DIAGNOSIS — H2513 Age-related nuclear cataract, bilateral: Secondary | ICD-10-CM | POA: Diagnosis not present

## 2016-03-10 DIAGNOSIS — H25812 Combined forms of age-related cataract, left eye: Secondary | ICD-10-CM | POA: Diagnosis not present

## 2016-03-10 DIAGNOSIS — Z961 Presence of intraocular lens: Secondary | ICD-10-CM | POA: Diagnosis not present

## 2016-03-16 DIAGNOSIS — H2513 Age-related nuclear cataract, bilateral: Secondary | ICD-10-CM | POA: Diagnosis not present

## 2016-03-16 DIAGNOSIS — Z961 Presence of intraocular lens: Secondary | ICD-10-CM | POA: Diagnosis not present

## 2016-03-31 DIAGNOSIS — H25811 Combined forms of age-related cataract, right eye: Secondary | ICD-10-CM | POA: Diagnosis not present

## 2016-03-31 DIAGNOSIS — H2511 Age-related nuclear cataract, right eye: Secondary | ICD-10-CM | POA: Diagnosis not present

## 2016-04-19 DIAGNOSIS — Z961 Presence of intraocular lens: Secondary | ICD-10-CM | POA: Diagnosis not present

## 2016-04-19 DIAGNOSIS — H2513 Age-related nuclear cataract, bilateral: Secondary | ICD-10-CM | POA: Diagnosis not present

## 2016-08-29 ENCOUNTER — Ambulatory Visit (INDEPENDENT_AMBULATORY_CARE_PROVIDER_SITE_OTHER): Payer: Medicare HMO | Admitting: Family Medicine

## 2016-08-29 ENCOUNTER — Encounter: Payer: Self-pay | Admitting: Family Medicine

## 2016-08-29 VITALS — BP 126/62 | Ht 70.0 in | Wt 176.0 lb

## 2016-08-29 DIAGNOSIS — L03116 Cellulitis of left lower limb: Secondary | ICD-10-CM | POA: Diagnosis not present

## 2016-08-29 MED ORDER — CEPHALEXIN 500 MG PO CAPS: ORAL_CAPSULE | ORAL | 1 refills | Status: DC

## 2016-08-29 NOTE — Patient Instructions (Signed)
Warm soaks 10-15 minutes 3 times daily  Keep that left foot elevated........... Continuously.......Marland Kitchen get up to go the bathroom and eat otherwise stay in the bed with that foot elevated.  Keflex 500 mg........... 2 tabs twice daily  Follow-up Wednesday morning............ I will make a house call  I would recommend you stop smoking completely.

## 2016-08-29 NOTE — Progress Notes (Signed)
Michael Bender is a 71 year old single male smoker,,,,,,,, half a pack a day,,, who comes in today for evaluation of swelling was left foot for 3 days  He does not recall any history of trauma  He does 3 days ago that he had some swelling of his foot. The fourth toe was red. Again does not recall any trauma.  Review of systems otherwise negative  BP 126/62 (BP Location: Right Arm, Patient Position: Sitting, Cuff Size: Normal)   Ht 5\' 10"  (1.778 m)   Wt 176 lb (79.8 kg)   SpO2 99%   BMI 25.25 kg/m  Examination of the foot shows a slight edema of the left foot with palpable posterior and anterior pulses. There is erythema below all 5 toes consistent with trauma. The left fourth toe nail is been trimmed to short and there is some secondary infection.  Impression cellulitis left foot  Plan,,,,,,,, warm soaks twice a day,,,,,,,,, elevation today in the more,,,,,,,, Keflex 1 g twice a day,,,,,, follow-up on Wednesday

## 2016-09-07 ENCOUNTER — Other Ambulatory Visit: Payer: Self-pay | Admitting: Family Medicine

## 2016-09-08 NOTE — Telephone Encounter (Signed)
Spoke with patient confirmed that he is no longer taking his blood pressure medication (Atenolol) was discontinued on 08/29/2016. Spoke with pharmacy to update the pt medication list.

## 2016-12-16 ENCOUNTER — Other Ambulatory Visit: Payer: Self-pay | Admitting: Family Medicine

## 2016-12-16 NOTE — Telephone Encounter (Signed)
Spoke with pt in regards to his Rx for Atenolol, pt stated that he is no longer taking this medication, Pt pharmacy was notified of the change.

## 2016-12-20 DIAGNOSIS — H26492 Other secondary cataract, left eye: Secondary | ICD-10-CM | POA: Diagnosis not present

## 2017-09-22 DIAGNOSIS — R69 Illness, unspecified: Secondary | ICD-10-CM | POA: Diagnosis not present

## 2017-11-16 DIAGNOSIS — Z888 Allergy status to other drugs, medicaments and biological substances status: Secondary | ICD-10-CM | POA: Diagnosis not present

## 2017-11-16 DIAGNOSIS — Z809 Family history of malignant neoplasm, unspecified: Secondary | ICD-10-CM | POA: Diagnosis not present

## 2017-11-16 DIAGNOSIS — Z8249 Family history of ischemic heart disease and other diseases of the circulatory system: Secondary | ICD-10-CM | POA: Diagnosis not present

## 2017-11-16 DIAGNOSIS — Z79891 Long term (current) use of opiate analgesic: Secondary | ICD-10-CM | POA: Diagnosis not present

## 2017-11-16 DIAGNOSIS — R69 Illness, unspecified: Secondary | ICD-10-CM | POA: Diagnosis not present

## 2017-11-16 DIAGNOSIS — Z720 Tobacco use: Secondary | ICD-10-CM | POA: Diagnosis not present

## 2017-11-16 DIAGNOSIS — K5909 Other constipation: Secondary | ICD-10-CM | POA: Diagnosis not present

## 2017-11-16 DIAGNOSIS — R030 Elevated blood-pressure reading, without diagnosis of hypertension: Secondary | ICD-10-CM | POA: Diagnosis not present

## 2017-11-16 DIAGNOSIS — T402X5D Adverse effect of other opioids, subsequent encounter: Secondary | ICD-10-CM | POA: Diagnosis not present

## 2017-11-16 DIAGNOSIS — G8929 Other chronic pain: Secondary | ICD-10-CM | POA: Diagnosis not present

## 2017-12-25 DIAGNOSIS — R69 Illness, unspecified: Secondary | ICD-10-CM | POA: Diagnosis not present

## 2018-02-26 DIAGNOSIS — H04123 Dry eye syndrome of bilateral lacrimal glands: Secondary | ICD-10-CM | POA: Diagnosis not present

## 2018-02-26 DIAGNOSIS — H26492 Other secondary cataract, left eye: Secondary | ICD-10-CM | POA: Diagnosis not present

## 2018-05-21 ENCOUNTER — Telehealth: Payer: Self-pay | Admitting: *Deleted

## 2018-05-21 NOTE — Telephone Encounter (Signed)
Left message on machine for patient to schedule TOC CRM

## 2018-05-22 ENCOUNTER — Encounter: Payer: Self-pay | Admitting: Family Medicine

## 2018-05-22 ENCOUNTER — Other Ambulatory Visit: Payer: Self-pay

## 2018-05-22 ENCOUNTER — Ambulatory Visit (INDEPENDENT_AMBULATORY_CARE_PROVIDER_SITE_OTHER): Payer: Medicare HMO | Admitting: Family Medicine

## 2018-05-22 DIAGNOSIS — R519 Headache, unspecified: Secondary | ICD-10-CM | POA: Insufficient documentation

## 2018-05-22 DIAGNOSIS — R209 Unspecified disturbances of skin sensation: Secondary | ICD-10-CM | POA: Insufficient documentation

## 2018-05-22 DIAGNOSIS — G8929 Other chronic pain: Secondary | ICD-10-CM

## 2018-05-22 DIAGNOSIS — R51 Headache: Secondary | ICD-10-CM

## 2018-05-22 DIAGNOSIS — I10 Essential (primary) hypertension: Secondary | ICD-10-CM

## 2018-05-22 HISTORY — DX: Headache, unspecified: R51.9

## 2018-05-22 HISTORY — DX: Unspecified disturbances of skin sensation: R20.9

## 2018-05-22 MED ORDER — AMITRIPTYLINE HCL 100 MG PO TABS: 50.0000 mg | ORAL_TABLET | Freq: Two times a day (BID) | ORAL | 0 refills | Status: DC

## 2018-05-22 NOTE — Progress Notes (Signed)
Virtual Visit via Telephone Note  I connected with Michael Bender on 05/22/18 at 11:30 AM EDT by telephone and verified that I am speaking with the correct person using two identifiers.   I discussed the limitations, risks, security and privacy concerns of performing an evaluation and management service by telephone and the availability of in person appointments. I also discussed with the patient that there may be a patient responsible charge related to this service. He expressed understanding and agreed to proceed.  Location patient: home Location provider: home office Participants present for the call: patient, provider Patient did not have a visit in the prior 7 days to address this/these issue(s).   History of Present Illness: Michael Bender is a 73 yo male with Hx of chronic pain, HTN, PVD,atrial fib (spontaneous conversion 06/2010),migraine, cervical and back pain among some.  He follows with chronic pain management,he is on Oxycodone 30 mg and Amitriptyline 50 mg bid,and Baclofen 10 mg. S/P L4-5 decompression with post lumbar interbody fusion. Follows with neurosurgeon, Dr Vertell Limber. Severe cervical pain,"terrible", Oxycodone doe snot seem to be helping.   HTN, he was on antihypertensives in the past,Tenoretic. He is not sure about reason to be discontinued. Concerned about "high BP's, 130/?. Denies severe/frequent headache, visual changes, chest pain, dyspnea, palpitation,or edema.  Lab Results  Component Value Date   CREATININE 0.99 10/13/2015   BUN 4 (L) 10/13/2015   NA 141 10/13/2015   K 3.6 10/13/2015   CL 103 10/13/2015   CO2 35 (H) 10/13/2015    Today he is concerned about intermittent  facial and hand tingling. Denies weakness. He has not identified exacerbating or alleviating factors.  He already has years Hx of LE constant tingling.  Right temporal headache, "ponding",intermittent for years. Alleviated by massaging area. He has not identified exacerbating  factors. It is not daily, not sure about frequency. Denies associated fever,chills,skin changes,visual changes,photophpbia,nausea, or vomiting.  Observations/Objective: Patient sounds cheerful and well on the phone. I do not appreciate any SOB. Speech and thought processing are grossly intact. Patient reported vitals:N/A  Assessment and Plan:  Essential hypertension For now recommend continuing non pharmacologic treatment. Possible complications of elevated BP discussed. Continue low salt diet. F/U in 2 months.  Disturbance of skin sensation Possible etiologies discussed. He does not want lab appt arranged because afraid of becoming in contact with COVID 19. Instructed about warning signs. Neuro referral placed.  PAIN, CHRONIC NEC He does not think pain is well controlled with current Oxycodone dose. Continue following with pain clinic. Some side effects of medications discussed.   Right sided temporal headache ? Migraine headache. Other possible etiologies discussed like temporal arthritis. He is not comfortable coming to the clinic for lab work. Instructed about warning signs. Neuro referral placed.  Follow Up Instructions: Monitor for warning signs. Blood work recommended: TSH,B12,BMP,and cbc among some.We can plan on having labs done next OV. Continue following with pain clinic.   I did not refer this patient for an OV in the next 24 hours for this/these issue(s).  I discussed the assessment and treatment plan with the patient. The patient was provided an opportunity to ask questions and all were answered. The patient agreed with the plan and demonstrated an understanding of the instructions.   The patient was advised to call back or seek an in-person evaluation if the symptoms worsen or if the condition fails to improve as anticipated.  Return for HTN.  I provided 25 minutes of non-face-to-face time during this encounter.  Efstathios Sawin [place], MD

## 2018-05-22 NOTE — Assessment & Plan Note (Signed)
For now recommend continuing non pharmacologic treatment. Possible complications of elevated BP discussed. Continue low salt diet. F/U in 2 months.

## 2018-05-22 NOTE — Assessment & Plan Note (Signed)
>>  ASSESSMENT AND PLAN FOR CHRONIC LOW BACK PAIN WRITTEN ON 05/22/2018  7:02 PM BY JORDAN, BETTY G, MD  He does not think pain is well controlled with current Oxycodone  dose. Continue following with pain clinic. Some side effects of medications discussed.

## 2018-05-22 NOTE — Assessment & Plan Note (Signed)
Possible etiologies discussed. He does not want lab appt arranged because afraid of becoming in contact with COVID 19. Instructed about warning signs. Neuro referral placed.

## 2018-05-22 NOTE — Assessment & Plan Note (Signed)
He does not think pain is well controlled with current Oxycodone dose. Continue following with pain clinic. Some side effects of medications discussed.

## 2018-05-22 NOTE — Assessment & Plan Note (Signed)
?   Migraine headache. Other possible etiologies discussed like temporal arthritis. He is not comfortable coming to the clinic for lab work. Instructed about warning signs. Neuro referral placed.

## 2018-09-19 DIAGNOSIS — R69 Illness, unspecified: Secondary | ICD-10-CM | POA: Diagnosis not present

## 2018-11-16 ENCOUNTER — Emergency Department (HOSPITAL_COMMUNITY): Payer: Medicare HMO

## 2018-11-16 ENCOUNTER — Other Ambulatory Visit: Payer: Self-pay

## 2018-11-16 ENCOUNTER — Encounter (HOSPITAL_COMMUNITY): Payer: Self-pay | Admitting: Emergency Medicine

## 2018-11-16 ENCOUNTER — Emergency Department (HOSPITAL_COMMUNITY)
Admission: EM | Admit: 2018-11-16 | Discharge: 2018-11-16 | Disposition: A | Discharge status: Home / Self Care | Payer: Medicare HMO | Attending: Emergency Medicine | Admitting: Emergency Medicine

## 2018-11-16 DIAGNOSIS — K7689 Other specified diseases of liver: Secondary | ICD-10-CM | POA: Diagnosis not present

## 2018-11-16 DIAGNOSIS — Z209 Contact with and (suspected) exposure to unspecified communicable disease: Secondary | ICD-10-CM | POA: Diagnosis not present

## 2018-11-16 DIAGNOSIS — N281 Cyst of kidney, acquired: Secondary | ICD-10-CM | POA: Diagnosis not present

## 2018-11-16 DIAGNOSIS — R69 Illness, unspecified: Secondary | ICD-10-CM | POA: Diagnosis not present

## 2018-11-16 DIAGNOSIS — K760 Fatty (change of) liver, not elsewhere classified: Secondary | ICD-10-CM | POA: Diagnosis not present

## 2018-11-16 DIAGNOSIS — K8050 Calculus of bile duct without cholangitis or cholecystitis without obstruction: Secondary | ICD-10-CM | POA: Diagnosis not present

## 2018-11-16 DIAGNOSIS — R0600 Dyspnea, unspecified: Secondary | ICD-10-CM | POA: Diagnosis not present

## 2018-11-16 DIAGNOSIS — R1011 Right upper quadrant pain: Secondary | ICD-10-CM

## 2018-11-16 DIAGNOSIS — J449 Chronic obstructive pulmonary disease, unspecified: Secondary | ICD-10-CM | POA: Diagnosis not present

## 2018-11-16 DIAGNOSIS — F17210 Nicotine dependence, cigarettes, uncomplicated: Secondary | ICD-10-CM | POA: Diagnosis not present

## 2018-11-16 DIAGNOSIS — R0602 Shortness of breath: Secondary | ICD-10-CM

## 2018-11-16 DIAGNOSIS — Z79899 Other long term (current) drug therapy: Secondary | ICD-10-CM | POA: Diagnosis not present

## 2018-11-16 DIAGNOSIS — Z20828 Contact with and (suspected) exposure to other viral communicable diseases: Secondary | ICD-10-CM | POA: Diagnosis not present

## 2018-11-16 DIAGNOSIS — R404 Transient alteration of awareness: Secondary | ICD-10-CM | POA: Diagnosis not present

## 2018-11-16 DIAGNOSIS — I10 Essential (primary) hypertension: Secondary | ICD-10-CM | POA: Insufficient documentation

## 2018-11-16 LAB — CBC WITH DIFFERENTIAL/PLATELET
Abs Immature Granulocytes: 0.04 10*3/uL (ref 0.00–0.07)
Basophils Absolute: 0 10*3/uL (ref 0.0–0.1)
Basophils Relative: 0 %
Eosinophils Absolute: 0 10*3/uL (ref 0.0–0.5)
Eosinophils Relative: 0 %
HCT: 34.7 % — ABNORMAL LOW (ref 39.0–52.0)
Hemoglobin: 11.6 g/dL — ABNORMAL LOW (ref 13.0–17.0)
Immature Granulocytes: 1 %
Lymphocytes Relative: 17 %
Lymphs Abs: 1.3 10*3/uL (ref 0.7–4.0)
MCH: 32.4 pg (ref 26.0–34.0)
MCHC: 33.4 g/dL (ref 30.0–36.0)
MCV: 96.9 fL (ref 80.0–100.0)
Monocytes Absolute: 0.8 10*3/uL (ref 0.1–1.0)
Monocytes Relative: 10 %
Neutro Abs: 5.4 10*3/uL (ref 1.7–7.7)
Neutrophils Relative %: 72 %
Platelets: 221 10*3/uL (ref 150–400)
RBC: 3.58 MIL/uL — ABNORMAL LOW (ref 4.22–5.81)
RDW: 13.2 % (ref 11.5–15.5)
WBC: 7.5 10*3/uL (ref 4.0–10.5)
nRBC: 0 % (ref 0.0–0.2)

## 2018-11-16 LAB — TROPONIN I (HIGH SENSITIVITY)
Troponin I (High Sensitivity): 9 ng/L (ref <18)
Troponin I (High Sensitivity): 8 ng/L (ref <18)

## 2018-11-16 LAB — URINALYSIS, ROUTINE W REFLEX MICROSCOPIC
Bilirubin Urine: NEGATIVE
Glucose, UA: NEGATIVE mg/dL
Hgb urine dipstick: NEGATIVE
Ketones, ur: NEGATIVE mg/dL
Leukocytes,Ua: NEGATIVE
Nitrite: NEGATIVE
Protein, ur: NEGATIVE mg/dL
Specific Gravity, Urine: 1.009 (ref 1.005–1.030)
pH: 7 (ref 5.0–8.0)

## 2018-11-16 LAB — COMPREHENSIVE METABOLIC PANEL
ALT: 13 U/L (ref 0–44)
AST: 24 U/L (ref 15–41)
Albumin: 3.4 g/dL — ABNORMAL LOW (ref 3.5–5.0)
Alkaline Phosphatase: 84 U/L (ref 38–126)
Anion gap: 7 (ref 5–15)
BUN: 27 mg/dL — ABNORMAL HIGH (ref 8–23)
CO2: 27 mmol/L (ref 22–32)
Calcium: 8.4 mg/dL — ABNORMAL LOW (ref 8.9–10.3)
Chloride: 104 mmol/L (ref 98–111)
Creatinine, Ser: 1.03 mg/dL (ref 0.61–1.24)
GFR calc Af Amer: >60 mL/min (ref >60)
GFR calc non Af Amer: >60 mL/min (ref >60)
Glucose, Bld: 127 mg/dL — ABNORMAL HIGH (ref 70–99)
Potassium: 4.7 mmol/L (ref 3.5–5.1)
Sodium: 138 mmol/L (ref 135–145)
Total Bilirubin: 1.1 mg/dL (ref 0.3–1.2)
Total Protein: 6 g/dL — ABNORMAL LOW (ref 6.5–8.1)

## 2018-11-16 LAB — LIPASE, BLOOD: Lipase: 14 U/L (ref 11–51)

## 2018-11-16 LAB — SARS CORONAVIRUS 2 (TAT 6-24 HRS): SARS Coronavirus 2: NEGATIVE

## 2018-11-16 LAB — D-DIMER, QUANTITATIVE: D-Dimer, Quant: 0.63 ug/mL-FEU — ABNORMAL HIGH (ref 0.00–0.50)

## 2018-11-16 MED ORDER — IOHEXOL 350 MG/ML SOLN
100.0000 mL | Freq: Once | INTRAVENOUS | Status: AC | PRN
  Administered 2018-11-16: 100 mL via INTRAVENOUS

## 2018-11-16 MED ORDER — KETOROLAC TROMETHAMINE 30 MG/ML IJ SOLN
15.0000 mg | Freq: Once | INTRAMUSCULAR | Status: AC
  Administered 2018-11-16: 15 mg via INTRAVENOUS
  Filled 2018-11-16: qty 1

## 2018-11-16 MED ORDER — SODIUM CHLORIDE 0.9 % IV BOLUS
1000.0000 mL | Freq: Once | INTRAVENOUS | Status: AC
  Administered 2018-11-16: 1000 mL via INTRAVENOUS

## 2018-11-16 MED ORDER — SODIUM CHLORIDE (PF) 0.9 % IJ SOLN
INTRAMUSCULAR | Status: AC
  Administered 2018-11-16: 10:00:00
  Filled 2018-11-16: qty 50

## 2018-11-16 NOTE — ED Provider Notes (Signed)
[facility] DEPT Provider Note   CSN: [id number] Arrival date & time: 11/16/18  0710     History   Chief Complaint Chief Complaint  Patient presents with   Shortness of Breath    SOB on exertion     HPI CLINTIN VANDYKEN is a 73 y.o. male past medical history of COPD (not on home oxygen), spinal fusion, presenting to the emergency department with right upper quadrant abdominal pain.  The patient reports that the pain began acutely when he was sitting in his recliner yesterday and reached around the back of the recliner to pick something up.  He describes sharp pain in his right upper quadrant.  He denies having this pain before.  He says that the pain is intermittent, but is currently present at 5 out of 10 intensity.  It is not related to inspiration.  It does not radiate.  He denies fevers or chills.  He denies vomiting.  He was able to eat a small breakfast and keep down the food this morning.  He reports his last bowel movement was 3 days ago, which he says is typical for him.  He reports he is still passing gas.  Aside for the pain he also notes generalized malaise.  He reports that he feels extremely tired and winded yesterday with minimal exertion, which began after his abdominal pain.  He says that even walking across his apartment left and feeling exhausted and lightheaded.  He has been in bed most of the day since then.  He states this never happened to him before.  He reports he is a lifelong smoker and smokes several cigarettes a day.  He denies any history of MI or cardiac disease personally.  He does report a heart attack in his mother at age 9.  He denies any personal history of blood clot or pulmonary embolism. He denies any abdominal surgical history.  He denies any dysuria, hematuria, or history of kidney stones.  He denies any family history of aortic aneurysm.   He denies any sick contacts in the house.  He lives alone.  He has felt  well until yesterday.     HPI  Past Medical History:  Diagnosis Date   Arthritis    Back   Blood transfusion    as a child   COPD (chronic obstructive pulmonary disease) ([place])    Headache(784.0)    last one 6 months ago   Hemorrhoids, internal    Hypertension    Neck rigidity    post cervical fusion   Nocturia    Pneumonia    Prostate disease     Patient Active Problem List   Diagnosis Date Noted   Disturbance of skin sensation 05/22/2018   Right sided temporal headache 05/22/2018   Esophageal dysphagia 07/15/2011   Bilateral leg pain 05/03/2010   EXTRINSIC ASTHMA, UNSPECIFIED 02/23/2009   [place] DISEASE, LUMBAR 01/01/2009   CONSTIPATION, CHRONIC 10/07/2008   GERD 04/04/2007   Chronic airway obstruction, not elsewhere classified 03/26/2007   Essential hypertension 03/05/2007   PAIN, CHRONIC NEC 10/06/2006    Past Surgical History:  Procedure Laterality Date   CERVICAL FUSION  1992   C2/C 3  four surgeries   COLONOSCOPY     ESOPHAGOGASTRODUODENOSCOPY  07/20/2011   Procedure: ESOPHAGOGASTRODUODENOSCOPY (EGD);  Surgeon: Gatha Mayer, MD;  Location: [facility];  Service: Endoscopy;  Laterality: N/A;   SAVORY DILATION  07/20/2011   Procedure: SAVORY DILATION;  Surgeon: Gatha Mayer,  MD;  Location: [facility];  Service: Endoscopy;  Laterality: N/A;   SPINAL FUSION  05/06/11        Home Medications    Prior to Admission medications   Medication Sig Start Date End Date Taking? Authorizing Provider  amitriptyline (ELAVIL) 50 MG tablet Take 50 mg by mouth 2 (two) times daily.   Yes [provider]  Aspirin-Salicylamide-Caffeine (BC HEADACHE POWDER PO) Take 1 packet by mouth as needed (headache/pain).   Yes [provider]  baclofen (LIORESAL) 20 MG tablet Take 20 mg by mouth 2 (two) times daily.   Yes [provider]  oxycodone (ROXICODONE) 30 MG immediate release tablet Take 30 mg by mouth 3 (three) times  daily.  08/21/15  Yes [provider]  amitriptyline (ELAVIL) 100 MG tablet Take 0.5 tablets (50 mg total) by mouth 2 (two) times daily. Patient not taking: Reported on 11/16/2018 05/22/18   [place], Betty G, MD    Family History Family History  Problem Relation Age of Onset   Heart disease Mother    Pancreatic cancer Father    Anesthesia problems Neg Hx     Social History Social History   Tobacco Use   Smoking status: Current Every Day Smoker    Years: 40.00    Types: Cigarettes    Last attempt to quit: 02/16/2011    Years since quitting: 7.7   Smokeless tobacco: Never Used  Substance Use Topics   Alcohol use: No   Drug use: No     Allergies   Nubain [nalbuphine hcl]   Review of Systems Review of Systems  Constitutional: Negative for chills and fever.  Respiratory: Negative for cough and shortness of breath.   Cardiovascular: Negative for chest pain and palpitations.  Gastrointestinal: Positive for abdominal pain and nausea. Negative for abdominal distention, blood in stool, diarrhea and vomiting.  Genitourinary: Negative for flank pain.  Musculoskeletal: Negative for back pain and myalgias.  Skin: Negative for pallor and rash.  Neurological: Positive for light-headedness. Negative for syncope and headaches.  All other systems reviewed and are negative.    Physical Exam Updated Vital Signs BP (!) 149/119 (BP Location: Left Arm)    Pulse 61    Temp 98.2 F (36.8 C) (Oral)    Resp 15    Ht 5\' 9"  (1.753 m)    Wt 75.3 kg    SpO2 99%    BMI 24.51 kg/m   Physical Exam Vitals signs and nursing note reviewed.  Constitutional:      Appearance: He is well-developed.  HENT:     Head: Normocephalic and atraumatic.  Eyes:     Conjunctiva/sclera: Conjunctivae normal.  Neck:     Musculoskeletal: Neck supple.  Cardiovascular:     Rate and Rhythm: Normal rate and regular rhythm.     Heart sounds: No murmur.  Pulmonary:     Effort: Pulmonary effort is  normal. No respiratory distress.     Breath sounds: Normal breath sounds. No decreased breath sounds or wheezing.     Comments: 100% on room air Chest:     Chest wall: No tenderness.  Abdominal:     Palpations: Abdomen is soft.     Tenderness: There is abdominal tenderness. There is no right CVA tenderness, left CVA tenderness, guarding or rebound. Positive signs include Murphy's sign. Negative signs include McBurney's sign.  Skin:    General: Skin is warm and dry.  Neurological:     General: No focal deficit present.  Mental Status: He is alert and oriented to person, place, and time.      ED Treatments / Results  Labs (all labs ordered are listed, but only abnormal results are displayed) Labs Reviewed  COMPREHENSIVE METABOLIC PANEL - Abnormal; Notable for the following components:      Result Value   Glucose, Bld 127 (*)    BUN 27 (*)    Calcium 8.4 (*)    Total Protein 6.0 (*)    Albumin 3.4 (*)    All other components within normal limits  D-DIMER, QUANTITATIVE (NOT AT [facility]) - Abnormal; Notable for the following components:   D-Dimer, Quant 0.63 (*)    All other components within normal limits  CBC WITH DIFFERENTIAL/PLATELET - Abnormal; Notable for the following components:   RBC 3.58 (*)    Hemoglobin 11.6 (*)    HCT 34.7 (*)    All other components within normal limits  SARS CORONAVIRUS 2 (TAT 6-24 HRS)  URINALYSIS, ROUTINE W REFLEX MICROSCOPIC  LIPASE, BLOOD  TROPONIN I (HIGH SENSITIVITY)  TROPONIN I (HIGH SENSITIVITY)    EKG EKG Interpretation  Date/Time:  Friday November 16 2018 07:21:06 EDT Ventricular Rate:  97 PR Interval:    QRS Duration: 84 QT Interval:  372 QTC Calculation: 473 R Axis:   73 Text Interpretation:  Sinus rhythm Consider left atrial enlargement No STEMI  Confirmed by Octaviano Glow [phone]) on 11/16/2018 7:39:59 AM   Radiology Dg Chest 2 View  Result Date: 11/16/2018 CLINICAL DATA:  Dyspnea EXAM: CHEST - 2 VIEW COMPARISON:   03/04/2011 chest radiograph. FINDINGS: Stable cardiomediastinal silhouette with normal heart size. No pneumothorax. No pleural effusion. Lungs appear clear, with no acute consolidative airspace disease and no pulmonary edema. IMPRESSION: No active cardiopulmonary disease. Electronically Signed   By: Ilona Sorrel M.D.   On: 11/16/2018 09:06   Ct Angio Chest Pe W/cm &/or Wo Cm  Result Date: 11/16/2018 CLINICAL DATA:  Shortness of breath. Right upper quadrant abdominal pain. EXAM: CT ANGIOGRAPHY CHEST CT ABDOMEN AND PELVIS WITH CONTRAST TECHNIQUE: Multidetector CT imaging of the chest was performed using the standard protocol during bolus administration of intravenous contrast. Multiplanar CT image reconstructions and MIPs were obtained to evaluate the vascular anatomy. Multidetector CT imaging of the abdomen and pelvis was performed using the standard protocol during bolus administration of intravenous contrast. CONTRAST:  124mL OMNIPAQUE IOHEXOL 350 MG/ML SOLN COMPARISON:  Abdominal ultrasound 11/16/2018 FINDINGS: CTA CHEST FINDINGS Cardiovascular: Heart is normal size. Aortic atherosclerosis. No aneurysm. No filling defects in the pulmonary arteries to suggest pulmonary emboli. Mediastinum/Nodes: No mediastinal, hilar, or axillary adenopathy. Trachea and esophagus are unremarkable. Thyroid unremarkable. Lungs/Pleura: Moderate centrilobular emphysema. No confluent opacities or effusions. Musculoskeletal: Chest wall soft tissues are unremarkable. No acute bony abnormality. Review of the MIP images confirms the above findings. CT ABDOMEN and PELVIS FINDINGS Hepatobiliary: Scattered hypodensities throughout the liver most compatible with cysts. No suspicious focal abnormality. Gallbladder unremarkable. Pancreas: No focal abnormality or ductal dilatation. Spleen: No focal abnormality.  Normal size. Adrenals/Urinary Tract: Multiple bilateral renal cysts. No hydronephrosis. Adrenal glands and urinary bladder  unremarkable. Stomach/Bowel: Large stool burden throughout the colon. Stomach, large and small bowel grossly unremarkable. Vascular/Lymphatic: Aortic atherosclerosis. No enlarged abdominal or pelvic lymph nodes. Reproductive: Mildly prominent prostate with central calcifications. Other: No free fluid or free air. Musculoskeletal: No acute bony abnormality. Review of the MIP images confirms the above findings. IMPRESSION: No evidence of pulmonary embolus. Mild emphysema. No acute cardiopulmonary disease. Scattered hepatic and  renal cysts. Large stool burden throughout the colon. Aortic atherosclerosis. Mildly prominent prostate. No acute findings in the abdomen or pelvis. Electronically Signed   By: Rolm Baptise M.D.   On: 11/16/2018 10:20   Ct Abdomen Pelvis W Contrast  Result Date: 11/16/2018 CLINICAL DATA:  Shortness of breath. Right upper quadrant abdominal pain. EXAM: CT ANGIOGRAPHY CHEST CT ABDOMEN AND PELVIS WITH CONTRAST TECHNIQUE: Multidetector CT imaging of the chest was performed using the standard protocol during bolus administration of intravenous contrast. Multiplanar CT image reconstructions and MIPs were obtained to evaluate the vascular anatomy. Multidetector CT imaging of the abdomen and pelvis was performed using the standard protocol during bolus administration of intravenous contrast. CONTRAST:  176mL OMNIPAQUE IOHEXOL 350 MG/ML SOLN COMPARISON:  Abdominal ultrasound 11/16/2018 FINDINGS: CTA CHEST FINDINGS Cardiovascular: Heart is normal size. Aortic atherosclerosis. No aneurysm. No filling defects in the pulmonary arteries to suggest pulmonary emboli. Mediastinum/Nodes: No mediastinal, hilar, or axillary adenopathy. Trachea and esophagus are unremarkable. Thyroid unremarkable. Lungs/Pleura: Moderate centrilobular emphysema. No confluent opacities or effusions. Musculoskeletal: Chest wall soft tissues are unremarkable. No acute bony abnormality. Review of the MIP images confirms the above  findings. CT ABDOMEN and PELVIS FINDINGS Hepatobiliary: Scattered hypodensities throughout the liver most compatible with cysts. No suspicious focal abnormality. Gallbladder unremarkable. Pancreas: No focal abnormality or ductal dilatation. Spleen: No focal abnormality.  Normal size. Adrenals/Urinary Tract: Multiple bilateral renal cysts. No hydronephrosis. Adrenal glands and urinary bladder unremarkable. Stomach/Bowel: Large stool burden throughout the colon. Stomach, large and small bowel grossly unremarkable. Vascular/Lymphatic: Aortic atherosclerosis. No enlarged abdominal or pelvic lymph nodes. Reproductive: Mildly prominent prostate with central calcifications. Other: No free fluid or free air. Musculoskeletal: No acute bony abnormality. Review of the MIP images confirms the above findings. IMPRESSION: No evidence of pulmonary embolus. Mild emphysema. No acute cardiopulmonary disease. Scattered hepatic and renal cysts. Large stool burden throughout the colon. Aortic atherosclerosis. Mildly prominent prostate. No acute findings in the abdomen or pelvis. Electronically Signed   By: Rolm Baptise M.D.   On: 11/16/2018 10:20   US Abdomen Limited Ruq  Result Date: 11/16/2018 CLINICAL DATA:  73 year old male with history of right upper quadrant abdominal pain for 1 day. EXAM: ULTRASOUND ABDOMEN LIMITED RIGHT UPPER QUADRANT COMPARISON:  None. FINDINGS: Gallbladder: No gallstones or wall thickening visualized. No sonographic Murphy sign noted by sonographer. Common bile duct: Diameter: 7 mm Liver: In the left lobe of the liver there is a 1.0 x 0.8 x 1.6 cm anechoic lesion with increased through transmission, compatible with a simple cyst. Diffusely increased echogenicity throughout the hepatic parenchyma, indicative of a background of hepatic steatosis. Portal vein is patent on color Doppler imaging with normal direction of blood flow towards the liver. Other: None. IMPRESSION: 1. No acute findings to account for  the patient's symptoms. Specifically, no cholelithiasis or findings to suggest an acute cholecystitis. 2. Small simple cyst in the left lobe of the liver incidentally noted. 3. Mild hepatic steatosis. Electronically Signed   By: Vinnie Langton M.D.   On: 11/16/2018 08:46    Procedures Procedures (including critical care time)  Medications Ordered in ED Medications  sodium chloride 0.9 % bolus 1,000 mL (0 mLs Intravenous Stopped 11/16/18 1211)  sodium chloride 0.9 % bolus 1,000 mL (0 mLs Intravenous Stopped 11/16/18 1211)  ketorolac (TORADOL) 30 MG/ML injection 15 mg (15 mg Intravenous Given 11/16/18 0920)  iohexol (OMNIPAQUE) 350 MG/ML injection 100 mL (100 mLs Intravenous Contrast Given 11/16/18 0944)  sodium chloride (PF) 0.9 %  injection (  Given by Other 11/16/18 0954)     Initial Impression / Assessment and Plan / ED Course  I have reviewed the triage vital signs and the nursing notes.  Pertinent labs & imaging results that were available during my care of the patient were reviewed by me and considered in my medical decision making (see chart for details).  73 year old male presenting to the emergency department with abrupt onset right upper quadrant abdominal pain that began after reaching her in his recliner yesterday.  He has a positive Murphy sign on exam.  He also reports generalized malaise and weakness with near syncope with exertion beginning with his pain yesterday.  We will obtain labs including a cardiac work-up and biliary work-up and a right upper quadrant ultrasound.  Also evaluate for other causes of right upper quadrant pain and near syncope including pulmonary embolism.  We will check a D-dimer, he may need a CT angio depending on the remainder of his work-up.  He has no pulsatile abdominal mass or bruit suggestive of aortic aneurysm.  There is equal pulses bilaterally.  He has no tachycardia or hypertension.  He does not present with an acute surgical  abdomen.  This note was dictated using dragon dictation software.  Please be aware that there may be minor translation errors as a result of this oral dictation  LOR LAMERE was evaluated in Emergency Department on 11/16/2018 for the symptoms described in the history of present illness. He was evaluated in the context of the global COVID-19 pandemic, which necessitated consideration that the patient might be at risk for infection with the SARS-CoV-2 virus that causes COVID-19. Institutional protocols and algorithms that pertain to the evaluation of patients at risk for COVID-19 are in a state of rapid change based on information released by regulatory bodies including the CDC and federal and state organizations. These policies and algorithms were followed during the patient's care in the ED.   Final Clinical Impressions(s) / ED Diagnoses   Final diagnoses:  Biliary colic  Shortness of breath  Right upper quadrant abdominal pain    ED Discharge Orders    None       Wyvonnia Dusky, MD 11/16/18 1801

## 2018-11-16 NOTE — Discharge Instructions (Addendum)
Please follow up with your primary care doctor this week.  Call his office to set up an appointment.

## 2018-11-16 NOTE — ED Notes (Addendum)
PATIENT AMBULATED  O2 STATS REMAIN IN MID 90'S

## 2018-11-16 NOTE — ED Triage Notes (Signed)
Patient arrived by EMS from home. Pt c/o SOB on exertion that began a few days ago.   Patient stated he has no issues with SOB when laying still.   Patient c/o pain in RUQ of ABD that comes intermittently.   Patient smokes occasionally. Patient states he's a social smoker that smokes about three cigarettes a week.

## 2018-11-16 NOTE — ED Notes (Signed)
EKG handed to [place] MD.

## 2018-11-16 NOTE — ED Notes (Signed)
Patient transported to CT 

## 2018-11-20 ENCOUNTER — Encounter: Payer: Self-pay | Admitting: Family Medicine

## 2018-11-20 ENCOUNTER — Ambulatory Visit (INDEPENDENT_AMBULATORY_CARE_PROVIDER_SITE_OTHER): Payer: Medicare HMO | Admitting: Family Medicine

## 2018-11-20 ENCOUNTER — Other Ambulatory Visit: Payer: Self-pay

## 2018-11-20 VITALS — BP 120/60 | HR 96 | Temp 98.0°F | Resp 20 | Ht 69.0 in | Wt 163.0 lb

## 2018-11-20 DIAGNOSIS — I10 Essential (primary) hypertension: Secondary | ICD-10-CM | POA: Diagnosis not present

## 2018-11-20 DIAGNOSIS — K219 Gastro-esophageal reflux disease without esophagitis: Secondary | ICD-10-CM | POA: Diagnosis not present

## 2018-11-20 DIAGNOSIS — K5909 Other constipation: Secondary | ICD-10-CM

## 2018-11-20 DIAGNOSIS — R7309 Other abnormal glucose: Secondary | ICD-10-CM | POA: Diagnosis not present

## 2018-11-20 DIAGNOSIS — K921 Melena: Secondary | ICD-10-CM

## 2018-11-20 DIAGNOSIS — J439 Emphysema, unspecified: Secondary | ICD-10-CM | POA: Diagnosis not present

## 2018-11-20 DIAGNOSIS — D649 Anemia, unspecified: Secondary | ICD-10-CM

## 2018-11-20 DIAGNOSIS — G4700 Insomnia, unspecified: Secondary | ICD-10-CM | POA: Diagnosis not present

## 2018-11-20 DIAGNOSIS — R0609 Other forms of dyspnea: Secondary | ICD-10-CM

## 2018-11-20 DIAGNOSIS — R0989 Other specified symptoms and signs involving the circulatory and respiratory systems: Secondary | ICD-10-CM | POA: Diagnosis not present

## 2018-11-20 DIAGNOSIS — R0600 Dyspnea, unspecified: Secondary | ICD-10-CM | POA: Diagnosis not present

## 2018-11-20 HISTORY — DX: Melena: K92.1

## 2018-11-20 LAB — POCT GLYCOSYLATED HEMOGLOBIN (HGB A1C): Hemoglobin A1C: 5.1 % (ref 4.0–5.6)

## 2018-11-20 MED ORDER — BUDESONIDE-FORMOTEROL FUMARATE 160-4.5 MCG/ACT IN AERO: 2.0000 | INHALATION_SPRAY | Freq: Two times a day (BID) | RESPIRATORY_TRACT | 3 refills | Status: DC

## 2018-11-20 MED ORDER — OMEPRAZOLE 40 MG PO CPDR: 40.0000 mg | DELAYED_RELEASE_CAPSULE | Freq: Every day | ORAL | 1 refills | Status: DC

## 2018-11-20 NOTE — Progress Notes (Signed)
HPI:   [name] is a 73 y.o. male, who is here today with his son to follow on recent ER visit.  He was last seen on 05/22/2018. Since his last visit he has been following with pain management.  Evaluated in the ED on 11/16/2018 because of shortness of breath and abdominal pain.  RUQ pain has improved. He has had some constipation but states that he is back to his normal, last bowel movement this past Sunday.  Lab Results  Component Value Date   WBC 7.5 11/16/2018   HGB 11.6 (L) 11/16/2018   HCT 34.7 (L) 11/16/2018   MCV 96.9 11/16/2018   PLT 221 11/16/2018   During ER visit BP elevated at 149/119. HTN on non pharmacologic treatment. He is not checking BP at home.  He denies CP,palpitations, or diaphoresis.  Lab Results  Component Value Date   CREATININE 1.03 11/16/2018   BUN 27 (H) 11/16/2018   NA 138 11/16/2018   K 4.7 11/16/2018   CL 104 11/16/2018   CO2 27 11/16/2018   Lab Results  Component Value Date   ALT 13 11/16/2018   AST 24 11/16/2018   ALKPHOS 84 11/16/2018   BILITOT 1.1 11/16/2018    Lab Results  Component Value Date   DDIMER 0.63 (H) 11/16/2018   RUQ [place]: 1. No acute findings to account for the patient's symptoms. Specifically, no cholelithiasis or findings to suggest an acute cholecystitis. 2. Small simple cyst in the left lobe of the liver incidentally noted. 3. Mild hepatic steatosis.  -New onset of shortness of breath when walking short distances, like walking from his kitchen to his room. He denies associated chest pain, palpitation, diaphoresis, or dizziness. + Tobacco use. Sometimes associated nonproductive cough and wheezing.  CXR: No active cardiopulmonary disease.  Chest CTA: No evidence of pulmonary embolus. Mild emphysema. No acute cardiopulmonary disease. Scattered hepatic and renal cysts. Large stool burden throughout the colon. Aortic atherosclerosis. Mildly prominent prostate. No acute  findings in the abdomen or pelvis.   -He states that for the couple weeks he can hear his heartbeat in his ears, he is not sure which one. Denies lightheadedness,hearing changes,or recent URI. He denies history of diabetes.  -Black stools with a "tinge of red." + Fatigue. His son has not noted pallor skin.  Intermittent episodes of heartburn and burping. He denies abdominal pain, nausea, or vomiting.  He is also requesting medication to help him sleep. He wakes up in the middle of the night to use the bathroom and cannot go back to sleep. He states that he has used OTC sleep aids but they have not helped. Currently he is on amitriptyline 50 mg twice daily.  He also mentions lower extremity numbness, from knee down,R>L. He has had this problem for many years. History of lower back pain with radiculopathy. Denies claudication-like symptoms. He has not noted lower extremity edema or erythema.   Review of Systems  Constitutional: Positive for fatigue. Negative for activity change, appetite change, fever and unexpected weight change.  HENT: Negative for nosebleeds, sore throat and trouble swallowing.   Eyes: Negative for redness and visual disturbance.  Respiratory: Negative for chest tightness and stridor.   Cardiovascular: Negative for leg swelling.  Gastrointestinal: Negative for abdominal distention.  Endocrine: Negative for cold intolerance, heat intolerance, polydipsia, polyphagia and polyuria.  Genitourinary: Negative for decreased urine volume, dysuria and hematuria.  Musculoskeletal: Positive for back pain, gait problem and neck pain.  Skin:  Negative for rash and wound.  Allergic/Immunologic: Positive for environmental allergies.  Neurological: Negative for syncope, weakness and headaches.  Psychiatric/Behavioral: Negative for confusion. The patient is nervous/anxious.   Rest see pertinent positives and negatives per HPI.   Current Outpatient Medications on File Prior  to Visit  Medication Sig Dispense Refill  . amitriptyline (ELAVIL) 50 MG tablet Take 50 mg by mouth 2 (two) times daily.    . Aspirin-Salicylamide-Caffeine (BC HEADACHE POWDER PO) Take 1 packet by mouth as needed (headache/pain).    . baclofen (LIORESAL) 20 MG tablet Take 20 mg by mouth 2 (two) times daily.    Marland Kitchen oxycodone (ROXICODONE) 30 MG immediate release tablet Take 30 mg by mouth 3 (three) times daily.      No current facility-administered medications on file prior to visit.      Past Medical History:  Diagnosis Date  . Arthritis    Back  . Blood transfusion    as a child  . COPD (chronic obstructive pulmonary disease) ([place])   . Headache(784.0)    last one 6 months ago  . Hemorrhoids, internal   . Hypertension   . Neck rigidity    post cervical fusion  . Nocturia   . Pneumonia   . Prostate disease    Allergies  Allergen Reactions  . Nubain [Nalbuphine Hcl]     Muscle contraction    Social History   Socioeconomic History  . Marital status: Divorced    Spouse name: Not on file  . Number of children: 2  . Years of education: Not on file  . Highest education level: Not on file  Occupational History  . Occupation: Disabled  Social Needs  . Financial resource strain: Not on file  . Food insecurity    Worry: Not on file    Inability: Not on file  . Transportation needs    Medical: Not on file    Non-medical: Not on file  Tobacco Use  . Smoking status: Current Every Day Smoker    Years: 40.00    Types: Cigarettes    Last attempt to quit: 02/16/2011    Years since quitting: 7.7  . Smokeless tobacco: Never Used  Substance and Sexual Activity  . Alcohol use: No  . Drug use: No  . Sexual activity: Not on file  Lifestyle  . Physical activity    Days per week: Not on file    Minutes per session: Not on file  . Stress: Not on file  Relationships  . Social Herbalist on phone: Not on file    Gets together: Not on file    Attends religious service:  Not on file    Active member of club or organization: Not on file    Attends meetings of clubs or organizations: Not on file    Relationship status: Not on file  Other Topics Concern  . Not on file  Social History Narrative  . Not on file    Vitals:   11/20/18 0904  BP: 120/60  Pulse: 96  Resp: 20  Temp: 98 F (36.7 C)  SpO2: 91%   Body mass index is 24.07 kg/m. Wt Readings from Last 3 Encounters:  11/20/18 163 lb (73.9 kg)  11/16/18 166 lb (75.3 kg)  08/29/16 176 lb (79.8 kg)     Physical Exam  Nursing note and vitals reviewed. Constitutional: He is oriented to person, place, and time. He appears well-developed and well-nourished. No distress.  HENT:  Head: Normocephalic and atraumatic.  Right Ear: Tympanic membrane, external ear and ear canal normal. Decreased hearing is noted.  Left Ear: Tympanic membrane, external ear and ear canal normal. Decreased hearing is noted.  Mouth/Throat: Oropharynx is clear and moist. Mucous membranes are dry. He has dentures.  Eyes: Pupils are equal, round, and reactive to light. Conjunctivae are normal.  Neck: Carotid bruit is present. No tracheal deviation present.  Cardiovascular: Normal rate and regular rhythm.  No murmur heard. Pulses:      Dorsalis pedis pulses are 2+ on the right side.  Hard to find DP and PT left side, 1+.  Respiratory: Effort normal and breath sounds normal. No respiratory distress.  GI: Soft. He exhibits no mass. There is no hepatomegaly. There is abdominal tenderness. There is no rigidity, no rebound and no guarding.    Genitourinary:    Genitourinary Comments: Deferred to GI evaluation.   Musculoskeletal:        General: No edema.  Lymphadenopathy:    He has no cervical adenopathy.  Neurological: He is alert and oriented to person, place, and time. He has normal strength. No cranial nerve deficit.  Skin: Skin is warm. No rash noted. No erythema. There is pallor.  Psychiatric: His mood appears anxious.   Fairly groomed, good eye contact.    ASSESSMENT AND PLAN:  Mr. Arline was seen today for hospitalization follow-up.  Diagnoses and all orders for this visit:  Lab Results  Component Value Date   HGBA1C 5.1 11/20/2018   Exertional dyspnea Possible etiologies discussed. ? COPD,deconditioning. Problem seems to be stable. Instructed about warning signs.  High glucose level Normal A1C.  -     POC HgB A1c  Decreased pedal pulses Appropriate foot care. ABI will be arranged.  -     VAS [place] ABI WITH/WO TBI; Future  Bruit of right carotid artery ? Carotid artery stenosis. Because GI bleed, I am not recommending aspirin today. Smoking cessation is strongly recommended. Instructed about warning signs.  -     VAS US CAROTID; Future  Anemia, unspecified type Mild, slightly worse when compared with CBC done 3 years ago.  It could be contributing to DOE. Iron rich diet recommended.  Gastroesophageal reflux disease, unspecified whether esophagitis present GERD precautions recommended. Omeprazole started today.  -     omeprazole (PRILOSEC) 40 MG capsule; Take 1 capsule (40 mg total) by mouth daily before breakfast.   CONSTIPATION, CHRONIC She feels like it is better. Adequate fluid and fiber intake. MiraLAX daily as needed may help. Instructed about warning signs.  Essential hypertension Today BP is adequate. He is on nonpharmacologic treatment. Recommend monitoring BP at home. Low-salt diet recommended.  Gastrointestinal hemorrhage with melena Clearly instructed about warning signs. GI appointment will be arranged. He reports having colonoscopy in 2017, I cannot see report. Smoking cessation strongly recommended.  Pulmonary emphysema ([place]) This problem could be a contributing factor for dyspnea. Smoking cessation recommended. Advair 250-4.5 mcg twice daily recommended. Some side effects discussed, instructed to swish mouth after use. Instructed about warning  signs.  Insomnia Recommend trying melatonin 10 mg 1 to 2 hours after dinner. Explained that some sleep aid medications are controlled medications and all others can interact with some of his medications. Good sleep hygiene is also recommended. Continue amitriptyline 100 mg daily.  40 min face to face OV. > 50% was dedicated to discussion of Dx, prognosis, treatment options, and some side effects of medications as well as coordination of care.  Return in about 2 months (around 01/20/2019).     Ananias Kolander G. Martinique, MD  [facility]. [place] office.

## 2018-11-20 NOTE — Patient Instructions (Addendum)
A few things to remember from today's visit:   Essential hypertension  High glucose level  Decreased pedal pulses - Plan: VAS [place] ABI WITH/WO TBI  Bruit of right carotid artery - Plan: VAS US CAROTID  Anemia, unspecified type  Gastrointestinal hemorrhage with melena - Plan: Ambulatory referral to Gastroenterology  Gastroesophageal reflux disease, unspecified whether esophagitis present - Plan: omeprazole (PRILOSEC) 40 MG capsule  Pulmonary emphysema, unspecified emphysema type ([place]) - Plan: budesonide-formoterol (SYMBICORT) 160-4.5 MCG/ACT inhaler  Exertional dyspnea   Please be sure medication list is accurate. If a new problem present, please set up appointment sooner than planned today.

## 2018-11-20 NOTE — Assessment & Plan Note (Signed)
She feels like it is better. Adequate fluid and fiber intake. MiraLAX daily as needed may help. Instructed about warning signs.

## 2018-11-20 NOTE — Assessment & Plan Note (Signed)
This problem could be a contributing factor for dyspnea. Smoking cessation recommended. Advair 250-4.5 mcg twice daily recommended. Some side effects discussed, instructed to swish mouth after use. Instructed about warning signs.

## 2018-11-20 NOTE — Assessment & Plan Note (Signed)
Today BP is adequate. He is on nonpharmacologic treatment. Recommend monitoring BP at home. Low-salt diet recommended.

## 2018-11-20 NOTE — Assessment & Plan Note (Signed)
Clearly instructed about warning signs. GI appointment will be arranged. He reports having colonoscopy in 2017, I cannot see report. Smoking cessation strongly recommended.

## 2018-11-20 NOTE — Assessment & Plan Note (Signed)
Recommend trying melatonin 10 mg 1 to 2 hours after dinner. Explained that some sleep aid medications are controlled medications and all others can interact with some of his medications. Good sleep hygiene is also recommended. Continue amitriptyline 100 mg daily.

## 2018-11-20 NOTE — Assessment & Plan Note (Signed)
>>  ASSESSMENT AND PLAN FOR PULMONARY EMPHYSEMA (HCC) WRITTEN ON 11/20/2018  1:48 PM BY JORDAN, BETTY G, MD  This problem could be a contributing factor for dyspnea. Smoking cessation recommended. Advair 250-4.5 mcg twice daily recommended. Some side effects discussed, instructed to swish mouth after use. Instructed about warning signs.

## 2018-12-04 ENCOUNTER — Ambulatory Visit (HOSPITAL_COMMUNITY)
Admission: RE | Admit: 2018-12-04 | Discharge: 2018-12-04 | Disposition: A | Discharge status: Home / Self Care | Payer: Medicare HMO | Source: Ambulatory Visit | Attending: Cardiology | Admitting: Cardiology

## 2018-12-04 ENCOUNTER — Other Ambulatory Visit: Payer: Self-pay

## 2018-12-04 ENCOUNTER — Ambulatory Visit (HOSPITAL_BASED_OUTPATIENT_CLINIC_OR_DEPARTMENT_OTHER)
Admission: RE | Admit: 2018-12-04 | Discharge: 2018-12-04 | Disposition: A | Discharge status: Home / Self Care | Payer: Medicare HMO | Source: Ambulatory Visit | Attending: Cardiology | Admitting: Cardiology

## 2018-12-04 ENCOUNTER — Other Ambulatory Visit (HOSPITAL_COMMUNITY): Payer: Self-pay | Admitting: Family Medicine

## 2018-12-04 DIAGNOSIS — R0989 Other specified symptoms and signs involving the circulatory and respiratory systems: Secondary | ICD-10-CM | POA: Diagnosis present

## 2018-12-04 DIAGNOSIS — I739 Peripheral vascular disease, unspecified: Secondary | ICD-10-CM

## 2018-12-05 ENCOUNTER — Other Ambulatory Visit: Payer: Self-pay | Admitting: Family Medicine

## 2018-12-05 DIAGNOSIS — I6529 Occlusion and stenosis of unspecified carotid artery: Secondary | ICD-10-CM

## 2018-12-05 DIAGNOSIS — I739 Peripheral vascular disease, unspecified: Secondary | ICD-10-CM

## 2018-12-07 ENCOUNTER — Telehealth: Payer: Self-pay | Admitting: *Deleted

## 2018-12-07 ENCOUNTER — Other Ambulatory Visit: Payer: Self-pay | Admitting: Family Medicine

## 2018-12-07 MED ORDER — ATORVASTATIN CALCIUM 40 MG PO TABS: 40.0000 mg | ORAL_TABLET | Freq: Every day | ORAL | 3 refills | Status: DC

## 2018-12-07 NOTE — Telephone Encounter (Signed)
Spoke with patient. He had in depth questions about his medication as well as well as his heart care visit. A virtual appointment has been scheduled to speak with Dr. Martinique.

## 2018-12-07 NOTE — Telephone Encounter (Signed)
Copied from [place] [id number]. Topic: General - Other >> Dec 07, 2018  1:01 PM Burchel, Abbi R wrote: Reason for CRM: Pt has questions about a recent rx (atorvastatin (LIPITOR) 40 MG tablet) called in by Dr Martinique.  Please call pt: [phone]

## 2018-12-11 ENCOUNTER — Encounter: Payer: Self-pay | Admitting: Family Medicine

## 2018-12-11 ENCOUNTER — Other Ambulatory Visit: Payer: Self-pay

## 2018-12-11 ENCOUNTER — Telehealth (INDEPENDENT_AMBULATORY_CARE_PROVIDER_SITE_OTHER): Payer: Medicare HMO | Admitting: Family Medicine

## 2018-12-11 DIAGNOSIS — R0609 Other forms of dyspnea: Secondary | ICD-10-CM

## 2018-12-11 DIAGNOSIS — I739 Peripheral vascular disease, unspecified: Secondary | ICD-10-CM | POA: Diagnosis not present

## 2018-12-11 DIAGNOSIS — R0600 Dyspnea, unspecified: Secondary | ICD-10-CM | POA: Diagnosis not present

## 2018-12-11 DIAGNOSIS — K921 Melena: Secondary | ICD-10-CM | POA: Diagnosis not present

## 2018-12-11 DIAGNOSIS — J432 Centrilobular emphysema: Secondary | ICD-10-CM

## 2018-12-11 DIAGNOSIS — K5909 Other constipation: Secondary | ICD-10-CM | POA: Diagnosis not present

## 2018-12-11 MED ORDER — LINACLOTIDE 145 MCG PO CAPS: 145.0000 ug | ORAL_CAPSULE | Freq: Every day | ORAL | 1 refills | Status: DC

## 2018-12-11 MED ORDER — ALBUTEROL SULFATE HFA 108 (90 BASE) MCG/ACT IN AERS: 2.0000 | INHALATION_SPRAY | Freq: Four times a day (QID) | RESPIRATORY_TRACT | 1 refills | Status: DC | PRN

## 2018-12-11 MED ORDER — FLUTICASONE-SALMETEROL 250-50 MCG/DOSE IN AEPB: 1.0000 | INHALATION_SPRAY | Freq: Two times a day (BID) | RESPIRATORY_TRACT | 3 refills | Status: DC

## 2018-12-11 NOTE — Assessment & Plan Note (Addendum)
This problem could be a contributing factor for dyspnea. Symbicort did not help, so discontinued. Advair 250-50 mcg twice daily and albuterol inhaler 2 puffs every 6 hours as needed added today. Encouraged smoking cessation. Instructed about warning signs.

## 2018-12-11 NOTE — Assessment & Plan Note (Signed)
He has not had any episodes of melena since omeprazole 40 mg was started. For now I recommend continuing omeprazole 40 mg to complete a weeks then we can try to decrease dose to omeprazole 20 mg. Continue GERD precautions. Following with GI.

## 2018-12-11 NOTE — Progress Notes (Signed)
Virtual Visit via Telephone Note  I connected with Michael Bender on 12/11/18 at  2:30 PM EST by telephone and verified that I am speaking with the correct person using two identifiers.   I discussed the limitations, risks, security and privacy concerns of performing an evaluation and management service by telephone and the availability of in person appointments. I also discussed with the patient that there may be a patient responsible charge related to this service. The patient expressed understanding and agreed to proceed.  Location patient: home Location provider: work office Participants present for the call: patient, provider Patient did not have a visit in the prior 7 days to address this/these issue(s).   History of Present Illness: Michael Bender is a 73 yo male following today on some of concerns addressed last visit. He also wants to discuss results of recent imaging tests.  He was last seen on 11/20/18, when we followed on ED visit, 11/16/18,seen due to SOB. Dyspnea exacerbated by walking short distances,even inside the house. No associated diaphoresis,plapitations,or CP. Emphysema noted on chest CT done on 11/16/18. Symbicort 160-4.5 mcg bid was started, still having SOB but it is not as bad as it was.  Constipation: Taking Miralax daily,also tried Bisacodyl but still having hard stools. He is not having abdominal pain and no blood in stool. Small daily stools. On chronic opioid treatment.  Pending appt with GI due to melena. Omeprazole 40 mg started, heartburn has improved. He has not had episodes of melena since last OV.  Recent carotid Duplex, 12/04/18: Right subclavian artery stenosis, right ECA > 50% stenosis. ABI 12/05/18:  Right: Resting right ankle-brachial index is within normal range. No evidence of significant right lower extremity arterial disease. The right toe-brachial index is normal. Left: Resting left ankle-brachial index indicates mild left lower  extremity arterial disease. The left toe-brachial index is normal.  He is not having calves pain with ambulation.  Observations/Objective: Patient sounds cheerful and well on the phone. I do not appreciate any SOB,cough,wheezing,or stridor. Speech and thought processing are grossly intact. Patient reported vitals:N/A  Assessment and Plan:  Exertional dyspnea We discussed possible etiologies: lung disease,cardiac,diconditioning among some to consider. Some improvement with Symbicort. He prefers to hold on pulmonology referral. Cardiology appt pending. Clearly instructed about warning signs.  Pulmonary emphysema ([place]) This problem could be a contributing factor for dyspnea. Symbicort did not help, so discontinued. Advair 250-50 mcg twice daily and albuterol inhaler 2 puffs every 6 hours as needed added today. Encouraged smoking cessation. Instructed about warning signs.  Gastrointestinal hemorrhage with melena He has not had any episodes of melena since omeprazole 40 mg was started. For now I recommend continuing omeprazole 40 mg to complete a weeks then we can try to decrease dose to omeprazole 20 mg. Continue GERD precautions. Following with GI.   CONSTIPATION, CHRONIC Most likely related with chronic opioid use. OTC medications have not helped. He agrees with trying Linzess 145 mcg daily, we discussed side effects. Adequate hydration and fiber intake.   PAD (peripheral artery disease) ([place]) We reviewed results of recent carotid US and ABI. We discussed some side effects of Aspirin, recommend starting Aspirin 81 mg daily. Encouraged to continue atorvastatin 40 mg. Pending appointment with vascular. Smoking cessation encouraged. Instructed about warning signs.   Follow Up Instructions:   I did not refer this patient for an OV in the next 24 hours for this/these issue(s).  I discussed the assessment and treatment plan with the patient. He was provided  an  opportunity to ask questions and all were answered. He agreed with the plan and demonstrated an understanding of the instructions.   The patient was advised to call back or seek an in-person evaluation if the symptoms worsen or if the condition fails to improve as anticipated.  I provided 21 minutes of non-face-to-face time during this encounter.   [name], MD

## 2018-12-11 NOTE — Assessment & Plan Note (Signed)
Most likely related with chronic opioid use. OTC medications have not helped. He agrees with trying Linzess 145 mcg daily, we discussed side effects. Adequate hydration and fiber intake.

## 2018-12-11 NOTE — Assessment & Plan Note (Signed)
We reviewed results of recent carotid US and ABI. We discussed some side effects of Aspirin, recommend starting Aspirin 81 mg daily. Encouraged to continue atorvastatin 40 mg. Pending appointment with vascular. Smoking cessation encouraged. Instructed about warning signs.

## 2018-12-11 NOTE — Assessment & Plan Note (Signed)
>>  ASSESSMENT AND PLAN FOR PULMONARY EMPHYSEMA (HCC) WRITTEN ON 12/11/2018  3:03 PM BY JORDAN, BETTY G, MD  This problem could be a contributing factor for dyspnea. Symbicort  did not help, so discontinued. Advair 250-50 mcg twice daily and albuterol  inhaler 2 puffs every 6 hours as needed added today. Encouraged smoking cessation. Instructed about warning signs.

## 2018-12-12 ENCOUNTER — Other Ambulatory Visit: Payer: Self-pay | Admitting: Family Medicine

## 2018-12-12 DIAGNOSIS — K219 Gastro-esophageal reflux disease without esophagitis: Secondary | ICD-10-CM

## 2018-12-18 ENCOUNTER — Encounter: Payer: Self-pay | Admitting: Internal Medicine

## 2018-12-19 ENCOUNTER — Other Ambulatory Visit: Payer: Self-pay

## 2018-12-19 ENCOUNTER — Ambulatory Visit (HOSPITAL_COMMUNITY)
Admission: RE | Admit: 2018-12-19 | Discharge: 2018-12-19 | Disposition: A | Discharge status: Home / Self Care | Payer: Medicare HMO | Source: Ambulatory Visit | Attending: Cardiology | Admitting: Cardiology

## 2018-12-19 DIAGNOSIS — I739 Peripheral vascular disease, unspecified: Secondary | ICD-10-CM | POA: Insufficient documentation

## 2019-01-07 ENCOUNTER — Telehealth: Payer: Self-pay | Admitting: Family Medicine

## 2019-01-07 NOTE — Telephone Encounter (Signed)
Fluticasone-Salmeterol (ADVAIR DISKUS) 250-50 MCG/DOSE AEPB   Pt is experiencing shaky hands and also has a hard time keeping his train of his thought. Wants PCP to fu and call back  Best contact: [phone]

## 2019-01-08 NOTE — Telephone Encounter (Signed)
Can you please add him to my schedule this afternoon, virtual visit. Thanks, BJ

## 2019-01-08 NOTE — Telephone Encounter (Signed)
Tried pt's son cell again, unable to lvm. Will try again tomorrow.

## 2019-01-08 NOTE — Telephone Encounter (Signed)
I spoke with pt. He is okay with scheduling a virtual visit - he just needs to schedule it when his son is available to help him. Placed a call to [phone] and [phone]. Unable to get in touch with pt's son or leave a voicemail, will try again.

## 2019-01-08 NOTE — Telephone Encounter (Signed)
Pt's son called back in, he requested that the office call his brother Roselyn Reef because he helps pt with his apt.   Phone:  [phone] jamie

## 2019-01-08 NOTE — Telephone Encounter (Signed)
I tried calling pt, unable to leave a message.  Michael Bender - can you try again this afternoon? We don't have anymore spots for today, but plenty for tomorrow; thank you!!

## 2019-01-09 NOTE — Telephone Encounter (Signed)
I called and spoke with Michael Bender. Pt and Michael Bender are scheduled for 10am Friday morning for a virtual visit.

## 2019-01-10 ENCOUNTER — Other Ambulatory Visit: Payer: Self-pay | Admitting: Family Medicine

## 2019-01-10 DIAGNOSIS — K219 Gastro-esophageal reflux disease without esophagitis: Secondary | ICD-10-CM

## 2019-01-11 ENCOUNTER — Telehealth (INDEPENDENT_AMBULATORY_CARE_PROVIDER_SITE_OTHER): Payer: Medicare HMO | Admitting: Family Medicine

## 2019-01-11 ENCOUNTER — Encounter: Payer: Self-pay | Admitting: Family Medicine

## 2019-01-11 VITALS — Ht 69.0 in

## 2019-01-11 DIAGNOSIS — I739 Peripheral vascular disease, unspecified: Secondary | ICD-10-CM

## 2019-01-11 DIAGNOSIS — Y92009 Unspecified place in unspecified non-institutional (private) residence as the place of occurrence of the external cause: Secondary | ICD-10-CM

## 2019-01-11 DIAGNOSIS — R251 Tremor, unspecified: Secondary | ICD-10-CM | POA: Diagnosis not present

## 2019-01-11 DIAGNOSIS — J439 Emphysema, unspecified: Secondary | ICD-10-CM | POA: Diagnosis not present

## 2019-01-11 DIAGNOSIS — W19XXXA Unspecified fall, initial encounter: Secondary | ICD-10-CM

## 2019-01-11 DIAGNOSIS — K219 Gastro-esophageal reflux disease without esophagitis: Secondary | ICD-10-CM

## 2019-01-11 DIAGNOSIS — R42 Dizziness and giddiness: Secondary | ICD-10-CM | POA: Diagnosis not present

## 2019-01-11 NOTE — Progress Notes (Signed)
Virtual Visit via Video Note   I connected with@ and his son on 01/11/19  by a video enabled telemedicine application and verified that I am speaking with the correct person using two identifiers.  Location patient: home Location provider:work or home office Persons participating in the virtual visit: patient, provider  I discussed the limitations of evaluation and management by telemedicine and the availability of in person appointments. The patient expressed understanding and agreed to proceed.   HPI: [name] is a 73 year old male with history of OSA, COPD, hypertension, and PAD who is complaining of shaking-like sensation and difficulty keeping his train of thoughts.Like he was " in a fog", improved.  He attributes problem to one of the inhalers added last visit.  Currently he is on Advair 250-50 mcg twice daily and albuterol inhaler to use as needed every 4-6 hours. Tremor is intermittent, he is not having problem today. He uses albuterol inhaler about once daily. Exertional dyspnea has improved, it is exacerbated by walking hills. He can walk longer if he walks on label ground.  Negative for associated chest pain, palpitation, or diaphoresis.  -His son is concerned because he has not received appointment to see vascular surgeon.  Also he has not received results of lower extremity US done recently.  12/19/2018, arterial duplex of left extremity: Left: Heterogenous plaque throughout. 50-74% stenosis in the proximal SFA distal to the ostium, high end range. 75-99% stenosis in the mid SFA, based on peak systolic velocities.  He also has carotid artery stenosis.  Currently he is on atorvastatin 40 mg daily. He is not on antiplatelet therapy at this time.  -His son is also reporting a fall 3 days ago.  He hit his head, he has some bruises around right eye but no other problem as a result of the fall. Fall attributed to dizziness, exacerbated by bending down.  He has not  noted headache, visual changes, N/V, or MS changes.  He is also asking if he needs to continue taking omeprazole 40 mg daily, he thought he was supposed to discontinue. He is no longer having episodes of melena.   ROS: See pertinent positives and negatives per HPI.  Past Medical History:  Diagnosis Date  . Arthritis    Back  . Blood transfusion    as a child  . COPD (chronic obstructive pulmonary disease) ([place])   . Headache(784.0)    last one 6 months ago  . Hemorrhoids, internal   . Hypertension   . Neck rigidity    post cervical fusion  . Nocturia   . Pneumonia   . Prostate disease     Past Surgical History:  Procedure Laterality Date  . CERVICAL FUSION  1992   C2/C 3  four surgeries  . COLONOSCOPY    . ESOPHAGOGASTRODUODENOSCOPY  07/20/2011   Procedure: ESOPHAGOGASTRODUODENOSCOPY (EGD);  Surgeon: Gatha Mayer, MD;  Location: [facility];  Service: Endoscopy;  Laterality: N/A;  . SAVORY DILATION  07/20/2011   Procedure: SAVORY DILATION;  Surgeon: Gatha Mayer, MD;  Location: [facility];  Service: Endoscopy;  Laterality: N/A;  . SPINAL FUSION  05/06/11    Family History  Problem Relation Age of Onset  . Heart disease Mother   . Pancreatic cancer Father   . Anesthesia problems Neg Hx     Social History   Socioeconomic History  . Marital status: Divorced    Spouse name: Not on file  . Number of children: 2  . Years of education:  Not on file  . Highest education level: Not on file  Occupational History  . Occupation: Disabled  Tobacco Use  . Smoking status: Former Smoker    Years: 40.00    Types: Cigarettes    Quit date: 12/12/2018    Years since quitting: 0.0  . Smokeless tobacco: Never Used  Substance and Sexual Activity  . Alcohol use: No  . Drug use: No  . Sexual activity: Not on file  Other Topics Concern  . Not on file  Social History Narrative  . Not on file   Social Determinants of Health   Financial Resource Strain:   . Difficulty of  Paying Living Expenses: Not on file  Food Insecurity:   . Worried About Charity fundraiser in the Last Year: Not on file  . Ran Out of Food in the Last Year: Not on file  Transportation Needs:   . Lack of Transportation (Medical): Not on file  . Lack of Transportation (Non-Medical): Not on file  Physical Activity:   . Days of Exercise per Week: Not on file  . Minutes of Exercise per Session: Not on file  Stress:   . Feeling of Stress : Not on file  Social Connections:   . Frequency of Communication with Friends and Family: Not on file  . Frequency of Social Gatherings with Friends and Family: Not on file  . Attends Religious Services: Not on file  . Active Member of Clubs or Organizations: Not on file  . Attends Archivist Meetings: Not on file  . Marital Status: Not on file  Intimate Partner Violence:   . Fear of Current or Ex-Partner: Not on file  . Emotionally Abused: Not on file  . Physically Abused: Not on file  . Sexually Abused: Not on file    Current Outpatient Medications:  .  amitriptyline (ELAVIL) 50 MG tablet, Take 50 mg by mouth 2 (two) times daily., Disp: , Rfl:  .  Aspirin-Salicylamide-Caffeine (BC HEADACHE POWDER PO), Take 1 packet by mouth as needed (headache/pain)., Disp: , Rfl:  .  atorvastatin (LIPITOR) 40 MG tablet, Take 1 tablet (40 mg total) by mouth daily., Disp: 90 tablet, Rfl: 3 .  baclofen (LIORESAL) 20 MG tablet, Take 20 mg by mouth 2 (two) times daily., Disp: , Rfl:  .  Fluticasone-Salmeterol (ADVAIR DISKUS) 250-50 MCG/DOSE AEPB, Inhale 1 puff into the lungs 2 (two) times daily., Disp: 1 each, Rfl: 3 .  linaclotide (LINZESS) 145 MCG CAPS capsule, Take 1 capsule (145 mcg total) by mouth daily before breakfast., Disp: 30 capsule, Rfl: 1 .  omeprazole (PRILOSEC) 40 MG capsule, TAKE 1 CAPSULE BY MOUTH EVERY DAY BEFORE BREAKFAST, Disp: 30 capsule, Rfl: 3 .  oxycodone (ROXICODONE) 30 MG immediate release tablet, Take 30 mg by mouth 3 (three) times  daily. , Disp: , Rfl:   EXAM:  VITALS per patient if applicable:Ht 5\' 9"  (1.753 m)   BMI 24.07 kg/m   GENERAL: alert, oriented, appears well and in no acute distress  HEENT: atraumatic, normocephalic,conjunctiva clear, no obvious abnormalities on inspection.  LUNGS: on inspection no signs of respiratory distress, breathing rate appears normal, no obvious gross SOB, gasping, cough,or wheezing  CV: no obvious cyanosis  PSYCH/NEURO: pleasant and cooperative, no obvious depression or anxiety, speech and thought processing grossly intact. Oriented x 3.  ASSESSMENT AND PLAN:  Discussed the following assessment and plan:  Tremor Most likely caused by albuterol inhaler, so recommend to discontinuing it. If problem continues, we  need to consider changing Advair for a different inhaler and/or doing further work-up.  Gastroesophageal reflux disease, unspecified whether esophagitis present Symptoms have improved. Recommend continuing omeprazole 40 mg daily.  Pulmonary emphysema, unspecified emphysema type ([place]) SOB improved. For now recommend continuing Advair 250-50 mcg twice daily. Discontinue albuterol for now.  If needed we could consider Xopenex in the future. Try to avoid walking hills, which seems to exacerbate problem. Keep next follow-up appointment.  Dizziness, nonspecific We discussed possible etiologies. Adequate hydration. Avoid activities that could aggravate problem.  Fall in home, initial encounter Fall precautions discussed.  PAD (peripheral artery disease) (HCC) Apparently he has not received a message from vascular office to arrange appointment. I gave his son vascular surgeon's phone number for him to call and arrange appointment.  We discussed recent left lower extremity duplex. He is no longer having melena, recommend starting Aspirin 81 mg daily.  Some side effect discussed. He already quit smoking, congratulations! Continue atorvastatin 40 mg daily.    I discussed the assessment and treatment plan with the patient. He and his son were  provided an opportunity to ask questions and all were answered. They agreed with the plan and demonstrated an understanding of the instructions.    Return for Keep next appt.    Comfort Iversen Martin[place], MD

## 2019-01-21 ENCOUNTER — Encounter: Payer: Self-pay | Admitting: Family Medicine

## 2019-01-21 ENCOUNTER — Ambulatory Visit (INDEPENDENT_AMBULATORY_CARE_PROVIDER_SITE_OTHER): Payer: Medicare HMO | Admitting: Family Medicine

## 2019-01-21 ENCOUNTER — Other Ambulatory Visit: Payer: Self-pay

## 2019-01-21 ENCOUNTER — Encounter: Payer: Self-pay | Admitting: Neurology

## 2019-01-21 VITALS — BP 120/70 | HR 93 | Temp 95.8°F | Resp 16 | Ht 69.0 in | Wt 160.5 lb

## 2019-01-21 DIAGNOSIS — R413 Other amnesia: Secondary | ICD-10-CM

## 2019-01-21 DIAGNOSIS — R0609 Other forms of dyspnea: Secondary | ICD-10-CM

## 2019-01-21 DIAGNOSIS — K921 Melena: Secondary | ICD-10-CM

## 2019-01-21 DIAGNOSIS — R233 Spontaneous ecchymoses: Secondary | ICD-10-CM

## 2019-01-21 DIAGNOSIS — R519 Headache, unspecified: Secondary | ICD-10-CM | POA: Diagnosis not present

## 2019-01-21 DIAGNOSIS — K5909 Other constipation: Secondary | ICD-10-CM | POA: Diagnosis not present

## 2019-01-21 DIAGNOSIS — R0600 Dyspnea, unspecified: Secondary | ICD-10-CM

## 2019-01-21 DIAGNOSIS — R251 Tremor, unspecified: Secondary | ICD-10-CM

## 2019-01-21 DIAGNOSIS — R200 Anesthesia of skin: Secondary | ICD-10-CM

## 2019-01-21 HISTORY — DX: Spontaneous ecchymoses: R23.3

## 2019-01-21 NOTE — Patient Instructions (Addendum)
A few things to remember from today's visit:   Tremor - Plan: Ambulatory referral to Neurology, TSH, C-reactive protein, Sed Rate (ESR)  Right facial numbness - Plan: Ambulatory referral to Neurology  CONSTIPATION, CHRONIC  Memory problem - Plan: Vitamin B12  Senile ecchymosis  Fall precautions. Keep appt with vascular and gastro. Stop Advair. Continue Aspirin and Lipitor.  Please be sure medication list is accurate. If a new problem present, please set up appointment sooner than planned today.

## 2019-01-21 NOTE — Progress Notes (Signed)
HPI:   Michael Bender is a 73 y.o. male, who is here today with his son for follow up.   He was last seen on 12/*12/2018, when he was complaining about new onset of tremor, attributed to inhalers, Albuterol was discontinued.  Tremor is getting worse. Denies history of alcohol abuse. Former smoker.  It is worse when writing. He denies head or LE tremor.  No FHx.   -He is also complaining about having difficulties with memory. He loses train of thought when having a conversation, difficulty finding words.  Problem started when he started inhalers. Sons has not noted changes recently, he noted it a few years ago.  -Exertional dyspnea is a stable, no chest pain, diaphoresis, or palpitations. Problem is still exacerbated by walking a hill, he has no problems when walking on even ground. No associated symptoms.   -3 weeks of right periocular numbness that last a few minutes. No associated focal deficit. He denies visual changes, skin rash, photophobia, or vomiting. Headache is bitemporal.No as bad as they were. Hx of migraines.  Smell changes through the years. Feeling something in his throat, ? Dysphagia.  -Nausea exacerbated by food intake and generalized abdominal "gas" pain. Pain is alleviated by passing gas.  Last bowel movement was 4 days ago. He has not started Linzess.  He has appt with GI arranged.  C/O easy bruising. Last visit Aspirin was added,problem seems to be worse since then. No gum bleeding,melena,or gross hematuria. He also take Advil Pm for pain and to help him sleep.  Review of Systems  Constitutional: Negative for appetite change, chills and fever.  HENT: Negative for mouth sores, nosebleeds and voice change.   Respiratory: Negative for cough, wheezing and stridor.   Cardiovascular: Negative for leg swelling.  Gastrointestinal: Negative for blood in stool.       No changes in bowel habits.  Endocrine: Negative for cold intolerance  and heat intolerance.  Genitourinary: Negative for decreased urine volume and dysuria.  Musculoskeletal: Positive for arthralgias and gait problem.  Neurological: Negative for syncope and facial asymmetry.  Rest of ROS, see pertinent positives sand negatives in HPI   Current Outpatient Medications on File Prior to Visit  Medication Sig Dispense Refill  . amitriptyline (ELAVIL) 50 MG tablet Take 50 mg by mouth 2 (two) times daily.    . Aspirin-Salicylamide-Caffeine (BC HEADACHE POWDER PO) Take 1 packet by mouth as needed (headache/pain).    Marland Kitchen atorvastatin (LIPITOR) 40 MG tablet Take 1 tablet (40 mg total) by mouth daily. 90 tablet 3  . baclofen (LIORESAL) 20 MG tablet Take 20 mg by mouth 2 (two) times daily.    Marland Kitchen linaclotide (LINZESS) 145 MCG CAPS capsule Take 1 capsule (145 mcg total) by mouth daily before breakfast. 30 capsule 1  . omeprazole (PRILOSEC) 40 MG capsule TAKE 1 CAPSULE BY MOUTH EVERY DAY BEFORE BREAKFAST 30 capsule 3  . oxycodone (ROXICODONE) 30 MG immediate release tablet Take 30 mg by mouth 3 (three) times daily.      No current facility-administered medications on file prior to visit.     Past Medical History:  Diagnosis Date  . Arthritis    Back  . Blood transfusion    as a child  . COPD (chronic obstructive pulmonary disease) ([place])   . Headache(784.0)    last one 6 months ago  . Hemorrhoids, internal   . Hypertension   . Neck rigidity    post cervical fusion  .  Nocturia   . Pneumonia   . Prostate disease    Allergies  Allergen Reactions  . Nubain [Nalbuphine Hcl]     Muscle contraction    Social History   Socioeconomic History  . Marital status: Divorced    Spouse name: Not on file  . Number of children: 2  . Years of education: Not on file  . Highest education level: Not on file  Occupational History  . Occupation: Disabled  Tobacco Use  . Smoking status: Former Smoker    Years: 40.00    Types: Cigarettes    Quit date: 12/12/2018    Years  since quitting: 0.1  . Smokeless tobacco: Never Used  Substance and Sexual Activity  . Alcohol use: No  . Drug use: No  . Sexual activity: Not on file  Other Topics Concern  . Not on file  Social History Narrative  . Not on file   Social Determinants of Health   Financial Resource Strain:   . Difficulty of Paying Living Expenses: Not on file  Food Insecurity:   . Worried About Charity fundraiser in the Last Year: Not on file  . Ran Out of Food in the Last Year: Not on file  Transportation Needs:   . Lack of Transportation (Medical): Not on file  . Lack of Transportation (Non-Medical): Not on file  Physical Activity:   . Days of Exercise per Week: Not on file  . Minutes of Exercise per Session: Not on file  Stress:   . Feeling of Stress : Not on file  Social Connections:   . Frequency of Communication with Friends and Family: Not on file  . Frequency of Social Gatherings with Friends and Family: Not on file  . Attends Religious Services: Not on file  . Active Member of Clubs or Organizations: Not on file  . Attends Archivist Meetings: Not on file  . Marital Status: Not on file    Vitals:   01/21/19 0838  BP: 120/70  Pulse: 93  Resp: 16  Temp: (!) 95.8 F (35.4 C)  SpO2: 95%   Body mass index is 23.7 kg/m.   Physical Exam  Nursing note and vitals reviewed. Constitutional: He is oriented to person, place, and time. He appears well-developed. No distress.  HENT:  Head: Normocephalic and atraumatic.  Mouth/Throat: Oropharynx is clear and moist and mucous membranes are normal. He has dentures.  Eyes: Pupils are equal, round, and reactive to light. Conjunctivae are normal.  Cardiovascular: Normal rate and regular rhythm.  No murmur heard. Respiratory: Effort normal and breath sounds normal. No respiratory distress.  GI: Soft. He exhibits no mass. There is no hepatomegaly. There is no abdominal tenderness.  Musculoskeletal:        General: No edema.    Neurological: He is alert and oriented to person, place, and time. He has normal strength. He displays tremor. Gait abnormal.  Unstable gait,not assisted. Bilateral hand tremor.  Skin: Skin is warm. Ecchymosis (Scattered on forearms) noted. No rash noted. No erythema.  Psychiatric: He has a normal mood and affect.  Fairly groomed, good eye contact.    ASSESSMENT AND PLAN:   Michael Bender was seen today for follow-up.  Orders Placed This Encounter  Procedures  . MR Brain W Wo Contrast  . TSH  . Vitamin B12  . C-reactive protein  . Sed Rate (ESR)  . Ambulatory referral to Neurology    Tremor Problem seems to be getting  worse. We discussed possible etiologies, including medication side effects, essential tremor, and Parkinson disease. Because he is also reporting facial numbness, smell changes, and possible dysphagia, I think it is appropriate to arrange neurology evaluation.  -     Ambulatory referral to Neurology -     TSH; Future -     C-reactive protein; Future -     Sed Rate (ESR); Future  Right facial numbness Neurologic examination today otherwise negative. Instructed about warning signs. Brain MRI will be arranged.  -     Ambulatory referral to Neurology  CONSTIPATION, CHRONIC Recommend starting Linzess 145 mcg daily, we discussed some side effects. Adequate fiber diet fluid intakes.  Memory problem Son is reporting problem as stable. Neurology appointment will be arranged. Further recommendation will be given according to lab results.  -     Vitamin B12; Future  Senile ecchymosis Educated about diagnosis. We discussed some side effects of Aspirin, no changes for now. Avoid trauma. Recommend avoiding Advil intake.  Gastrointestinal hemorrhage with melena Nausea dysphagia could be related to this problem. Continue omeprazole 40 mg daily. Keep appointment with gastroenterologist.  Exertional dyspnea Problem is stable. No significant  improvement with Advair, because possible side effects recommend discontinuing. We also discussed the possibility of cardiac etiology, for now he agrees with holding on cardiology referral.  Recommend avoiding trigger factors.  We do not have labs service today.  Return in about 3 months (around 04/21/2019) for Needs lab appt for this week.     Zanasia Hickson G. [place], MD  [facility]. [place] office.

## 2019-01-22 ENCOUNTER — Other Ambulatory Visit: Payer: Self-pay | Admitting: Family Medicine

## 2019-01-22 DIAGNOSIS — J432 Centrilobular emphysema: Secondary | ICD-10-CM

## 2019-01-30 ENCOUNTER — Encounter: Payer: Self-pay | Admitting: Vascular Surgery

## 2019-01-30 ENCOUNTER — Ambulatory Visit: Payer: Medicare HMO | Admitting: Vascular Surgery

## 2019-01-30 ENCOUNTER — Other Ambulatory Visit: Payer: Self-pay

## 2019-01-30 VITALS — BP 177/104 | HR 92 | Temp 97.3°F | Resp 18 | Ht 71.0 in | Wt 160.0 lb

## 2019-01-30 DIAGNOSIS — I739 Peripheral vascular disease, unspecified: Secondary | ICD-10-CM | POA: Diagnosis not present

## 2019-01-30 NOTE — Progress Notes (Signed)
Referring Physician: Dr Betty Martinique  Patient name: Michael Bender MRN: [id number] DOB: [birth year] Sex: male  REASON FOR CONSULT: Peripheral arterial and carotid occlusive disease  HPI: Michael Bender is a 73 y.o. male sent for evaluation of carotid and peripheral arterial disease after screening on a physical exam November 2020.  The patient has no history of TIA amaurosis or stroke.  He did have an episode a couple of weeks ago where he had some numbness on the right side of his head.  He is currently on aspirin and a statin.  He quit smoking in November but occasionally bums a cigarette from a friend.  He does not describe any claudication symptoms in either extremity.  He does have numbness and tingling in both legs which he states has been present since a lumbar fusion several years ago.  He does not have rest pain.  He does not have any nonhealing wounds on his feet.  When questioned about his walking distance he states he gets short of breath before he has any pain in his legs.  He was also noted on his duplex exam to have some narrowing of the right subclavian artery.  He has no exertional fatigue or dizziness associated with use of his right arm.  Other medical problems include degenerative arthritis, COPD hypertension all of which have been stable.  Past Medical History:  Diagnosis Date  . Arthritis    Back  . Blood transfusion    as a child  . COPD (chronic obstructive pulmonary disease) ([place])   . Headache(784.0)    last one 6 months ago  . Hemorrhoids, internal   . Hypertension   . Neck rigidity    post cervical fusion  . Nocturia   . Pneumonia   . Prostate disease    Past Surgical History:  Procedure Laterality Date  . CERVICAL FUSION  1992   C2/C 3  four surgeries  . COLONOSCOPY    . ESOPHAGOGASTRODUODENOSCOPY  07/20/2011   Procedure: ESOPHAGOGASTRODUODENOSCOPY (EGD);  Surgeon: Gatha Mayer, MD;  Location: [facility];  Service: Endoscopy;  Laterality: N/A;    . SAVORY DILATION  07/20/2011   Procedure: SAVORY DILATION;  Surgeon: Gatha Mayer, MD;  Location: [facility];  Service: Endoscopy;  Laterality: N/A;  . SPINAL FUSION  05/06/11    Family History  Problem Relation Age of Onset  . Heart disease Mother   . Pancreatic cancer Father   . Anesthesia problems Neg Hx     SOCIAL HISTORY: Social History   Socioeconomic History  . Marital status: Divorced    Spouse name: Not on file  . Number of children: 2  . Years of education: Not on file  . Highest education level: Not on file  Occupational History  . Occupation: Disabled  Tobacco Use  . Smoking status: Former Smoker    Years: 40.00    Types: Cigarettes    Quit date: 12/12/2018    Years since quitting: 0.1  . Smokeless tobacco: Never Used  Substance and Sexual Activity  . Alcohol use: No  . Drug use: No  . Sexual activity: Not on file  Other Topics Concern  . Not on file  Social History Narrative  . Not on file   Social Determinants of Health   Financial Resource Strain:   . Difficulty of Paying Living Expenses: Not on file  Food Insecurity:   . Worried About Charity fundraiser in the Last Year: Not on  file  . Loyal in the Last Year: Not on file  Transportation Needs:   . Lack of Transportation (Medical): Not on file  . Lack of Transportation (Non-Medical): Not on file  Physical Activity:   . Days of Exercise per Week: Not on file  . Minutes of Exercise per Session: Not on file  Stress:   . Feeling of Stress : Not on file  Social Connections:   . Frequency of Communication with Friends and Family: Not on file  . Frequency of Social Gatherings with Friends and Family: Not on file  . Attends Religious Services: Not on file  . Active Member of Clubs or Organizations: Not on file  . Attends Archivist Meetings: Not on file  . Marital Status: Not on file  Intimate Partner Violence:   . Fear of Current or Ex-Partner: Not on file  . Emotionally  Abused: Not on file  . Physically Abused: Not on file  . Sexually Abused: Not on file    Allergies  Allergen Reactions  . Nubain [Nalbuphine Hcl]     Muscle contraction    Current Outpatient Medications  Medication Sig Dispense Refill  . amitriptyline (ELAVIL) 50 MG tablet Take 50 mg by mouth 2 (two) times daily.    . Aspirin-Salicylamide-Caffeine (BC HEADACHE POWDER PO) Take 1 packet by mouth as needed (headache/pain).    Marland Kitchen atorvastatin (LIPITOR) 40 MG tablet Take 1 tablet (40 mg total) by mouth daily. 90 tablet 3  . baclofen (LIORESAL) 20 MG tablet Take 20 mg by mouth 2 (two) times daily.    Marland Kitchen linaclotide (LINZESS) 145 MCG CAPS capsule Take 1 capsule (145 mcg total) by mouth daily before breakfast. 30 capsule 1  . omeprazole (PRILOSEC) 40 MG capsule TAKE 1 CAPSULE BY MOUTH EVERY DAY BEFORE BREAKFAST 30 capsule 3  . oxycodone (ROXICODONE) 30 MG immediate release tablet Take 30 mg by mouth 3 (three) times daily.      No current facility-administered medications for this visit.    ROS:   General:  No weight loss, Fever, chills  HEENT: No recent headaches, no nasal bleeding, no visual changes, no sore throat  Neurologic: No dizziness, blackouts, seizures. No recent symptoms of stroke or mini- stroke. No recent episodes of slurred speech, or temporary blindness.  Cardiac: No recent episodes of chest pain/pressure, no shortness of breath at rest.  + shortness of breath with exertion.  Denies history of atrial fibrillation or irregular heartbeat  Vascular: No history of rest pain in feet.  No history of claudication.  No history of non-healing ulcer, No history of DVT   Pulmonary: No home oxygen, no productive cough, no hemoptysis,  No asthma or wheezing  Musculoskeletal:  [X]  Arthritis, [X]  Low back pain,  [X]  Joint pain  Hematologic:No history of hypercoagulable state.  No history of easy bleeding.  No history of anemia  Gastrointestinal: No hematochezia or melena,  No  gastroesophageal reflux, no trouble swallowing  Urinary: [ ]  chronic Kidney disease, [ ]  on HD - [ ]  MWF or [ ]  TTHS, [ ]  Burning with urination, [ ]  Frequent urination, [ ]  Difficulty urinating;   Skin: No rashes  Psychological: No history of anxiety,  No history of depression   Physical Examination  Vitals:   01/30/19 0843 01/30/19 0845  BP: (!) 168/98 (!) 177/104  Pulse: 92   Resp: 18   Temp: (!) 97.3 F (36.3 C)   TempSrc: Temporal   SpO2: 98%  Weight: 160 lb (72.6 kg)   Height: 5\' 11"  (1.803 m)     Body mass index is 22.32 kg/m.  General:  Alert and oriented, no acute distress HEENT: Normal Neck: No JVD Pulmonary: Clear to auscultation bilaterally Cardiac: Regular Rate and Rhythm  Abdomen: Soft, non-tender, non-distended, no mass Skin: No rash Extremity Pulses:  2+ radial, brachial, femoral, 2+ right 1+ left dorsalis pedis, 2+ right absent left posterior tibial pulses  Musculoskeletal: No deformity or edema  Neurologic: Upper and lower extremity motor 5/5 and symmetric  DATA:  I reviewed the patient's carotid duplex exam dated December 04, 2018.  This showed less than 39% stenosis of his internal carotid arteries bilaterally.  There was some turbulent flow in the right subclavian artery suggestive of some level of stenosis.  He had bilateral ABIs that were 1.2 triphasic and normal in the right leg, 0.89 in the left leg suggestive of mild peripheral arterial disease.  He then had a follow-up duplex exam of the left leg which showed 50 to 74% stenosis of the proximal to mid left superficial femoral artery.  ASSESSMENT: Evidence of peripheral arterial disease with atherosclerotic plaque in his carotid arteries possible mild right subclavian artery stenosis all of which are currently asymptomatic.  He also has mild peripheral arterial disease again asymptomatic.   PLAN: Agree with Dr. Doug Sou plan for risk factor modification with smoking cessation starting aspirin and  statin.  He will follow-up with me on an as-needed basis if he develops claudication rest pain or tissue loss symptoms in his legs at some point in the future.  According to his son he apparently is scheduled for an MRI in the near future.  I told him to keep this appointment as there are other things that can cause symptoms of numbness in the face other than carotid occlusive disease.  As far as his carotid arteries are concerned I would not consider repeat duplex of these unless he develops symptoms or change in physical exam as he has minimal disease currently.  I also discussed with the patient that he should have a blood pressure measured in his left arm as he may have mild right subclavian artery stenosis which currently although asymptomatic would artificially deflate his blood pressure if checked on the right side.   Ruta Hinds, MD [facility] Office: [phone] Pager: [phone]

## 2019-02-06 ENCOUNTER — Encounter: Payer: Self-pay | Admitting: Internal Medicine

## 2019-02-06 ENCOUNTER — Other Ambulatory Visit (INDEPENDENT_AMBULATORY_CARE_PROVIDER_SITE_OTHER): Payer: Medicare HMO

## 2019-02-06 ENCOUNTER — Other Ambulatory Visit: Payer: Self-pay

## 2019-02-06 ENCOUNTER — Ambulatory Visit (INDEPENDENT_AMBULATORY_CARE_PROVIDER_SITE_OTHER): Payer: Medicare HMO | Admitting: Internal Medicine

## 2019-02-06 VITALS — BP 150/80 | HR 88 | Temp 98.2°F | Ht 69.5 in | Wt 162.0 lb

## 2019-02-06 DIAGNOSIS — K5909 Other constipation: Secondary | ICD-10-CM | POA: Diagnosis not present

## 2019-02-06 DIAGNOSIS — R634 Abnormal weight loss: Secondary | ICD-10-CM | POA: Diagnosis not present

## 2019-02-06 DIAGNOSIS — Z1159 Encounter for screening for other viral diseases: Secondary | ICD-10-CM | POA: Diagnosis not present

## 2019-02-06 DIAGNOSIS — K921 Melena: Secondary | ICD-10-CM

## 2019-02-06 DIAGNOSIS — R1310 Dysphagia, unspecified: Secondary | ICD-10-CM | POA: Diagnosis not present

## 2019-02-06 DIAGNOSIS — D649 Anemia, unspecified: Secondary | ICD-10-CM

## 2019-02-06 DIAGNOSIS — F1120 Opioid dependence, uncomplicated: Secondary | ICD-10-CM

## 2019-02-06 DIAGNOSIS — R69 Illness, unspecified: Secondary | ICD-10-CM | POA: Diagnosis not present

## 2019-02-06 LAB — CBC WITH DIFFERENTIAL/PLATELET
Basophils Absolute: 0 10*3/uL (ref 0.0–0.1)
Basophils Relative: 0.3 % (ref 0.0–3.0)
Eosinophils Absolute: 0.1 10*3/uL (ref 0.0–0.7)
Eosinophils Relative: 0.8 % (ref 0.0–5.0)
HCT: 38.8 % — ABNORMAL LOW (ref 39.0–52.0)
Hemoglobin: 13.1 g/dL (ref 13.0–17.0)
Lymphocytes Relative: 16.9 % (ref 12.0–46.0)
Lymphs Abs: 1.1 10*3/uL (ref 0.7–4.0)
MCHC: 33.7 g/dL (ref 30.0–36.0)
MCV: 93.8 fl (ref 78.0–100.0)
Monocytes Absolute: 0.5 10*3/uL (ref 0.1–1.0)
Monocytes Relative: 8.3 % (ref 3.0–12.0)
Neutro Abs: 4.8 10*3/uL (ref 1.4–7.7)
Neutrophils Relative %: 73.7 % (ref 43.0–77.0)
Platelets: 232 10*3/uL (ref 150.0–400.0)
RBC: 4.14 Mil/uL — ABNORMAL LOW (ref 4.22–5.81)
RDW: 13.7 % (ref 11.5–15.5)
WBC: 6.5 10*3/uL (ref 4.0–10.5)

## 2019-02-06 LAB — COMPREHENSIVE METABOLIC PANEL
ALT: 13 U/L (ref 0–53)
AST: 37 U/L (ref 0–37)
Albumin: 4 g/dL (ref 3.5–5.2)
Alkaline Phosphatase: 105 U/L (ref 39–117)
BUN: 22 mg/dL (ref 6–23)
CO2: 29 mEq/L (ref 19–32)
Calcium: 8.6 mg/dL (ref 8.4–10.5)
Chloride: 102 mEq/L (ref 96–112)
Creatinine, Ser: 1.04 mg/dL (ref 0.40–1.50)
GFR: 69.77 mL/min (ref >60.00)
Glucose, Bld: 107 mg/dL — ABNORMAL HIGH (ref 70–99)
Potassium: 4.4 mEq/L (ref 3.5–5.1)
Sodium: 139 mEq/L (ref 135–145)
Total Bilirubin: 0.4 mg/dL (ref 0.2–1.2)
Total Protein: 6.8 g/dL (ref 6.0–8.3)

## 2019-02-06 LAB — TSH: TSH: 0.57 u[IU]/mL (ref 0.35–4.50)

## 2019-02-06 NOTE — Patient Instructions (Signed)
You have been scheduled for an endoscopy and colonoscopy. Please follow the written instructions given to you at your visit today. Please pick up your prep supplies at the pharmacy within the next 1-3 days. If you use inhalers (even only as needed), please bring them with you on the day of your procedure.  Due to recent COVID-19 restrictions implemented by [employer] and state authorities and in an effort to keep both patients and staff as safe as possible, [facility] requires COVID-19 testing prior to any scheduled endoscopic procedure. The testing center is located at [address] [place], [place] [address] in the [facility] [employer]  suite.  Your appointment has been scheduled for 03/06/2019 at 8:00AM.   Please bring your insurance cards to this appointment. You will require your COVID screen 2 business days prior to your endoscopic procedure.  You are not required to quarantine after your screening.  You will only receive a phone call with the results if it is POSITIVE.  If you do not receive a call the day before your procedure you should begin your prep, if ordered, and you should report to the endo center for your procedure at your designated appointment arrival time ( one hour prior to the procedure time). There is no cost to you for the screening on the day of the swab.  [facility] will file with your insurance company for the testing.    You may receive an automated phone call prior to your procedure or have a message in your MyChart that you have an appointment for a BP/15 at the [facility], please disregard this message.  Your testing will be at the [place] [place] location.   If you are leaving [facility] travel [place] on [address], turn left onto [address], turn night onto [address] at the 1st stop light turn right, pass the  [employer] on your right and proceed to [address] (white building).     Your provider has requested that you go to the basement level for lab work before leaving today. Press "B" on the elevator. The lab is located at the first door on the left as you exit the elevator.   We are giving you samples to try today of Linzess 290 mcg to replace your Linzess 145 mcg. Take one daily before breakfast.    Increase your Miralax to 2 doses at bedtime.   I appreciate the opportunity to care for you. Silvano Rusk, MD, [facility]

## 2019-02-06 NOTE — Progress Notes (Signed)
Michael Bender 73 y.o. Mar 06, 1945 [id number]  Assessment & Plan:   Encounter Diagnoses  Name Primary?  . Loss of weight Yes  . Chronic constipation   . Mild anemia   . Black stool   . Dysphagia, unspecified type   . Chronic narcotic dependence ([place])   . Special screening examination for viral disease     Increase Linzess to 290 mcg 2 miralax pm EGD and colonoscopy to evaluate his GI problems listed above.  I doubt he had bleeding.  However it is possible.  Double prep for colonoscopy given chronic constipation.  The risks and benefits as well as alternatives of endoscopic procedure(s) have been discussed and reviewed. All questions answered. The patient agrees to proceed.   Orders Placed This Encounter  Procedures  . SARS Coronavirus 2 ([facility] ONLY)  . CBC w/Diff  . TSH  . Comprehensive metabolic panel  . Ambulatory referral to Gastroenterology    Lab Results  Component Value Date   WBC 6.5 02/06/2019   HGB 13.1 02/06/2019   HCT 38.8 (L) 02/06/2019   MCV 93.8 02/06/2019   PLT 232.0 02/06/2019   Lab Results  Component Value Date   TSH 0.57 02/06/2019   Lab Results  Component Value Date   CREATININE 1.04 02/06/2019   BUN 22 02/06/2019   NA 139 02/06/2019   K 4.4 02/06/2019   CL 102 02/06/2019   CO2 29 02/06/2019    Lab Results  Component Value Date   ALT 13 02/06/2019   AST 37 02/06/2019   ALKPHOS 105 02/06/2019   BILITOT 0.4 02/06/2019     Subjective:   Chief Complaint:  black stool question bleeding  HPI The patient is here, son is with him, he was seen in the emergency department in October with right upper quadrant pain and had a black stool somewhere around that time.  He is concerned that he might of had some bleeding.  He also suffers from chronic constipation and was prescribed Linzess but does not think that is helped.  There is unintentional weight loss and loss of appetite as well.  The pain with the right upper quadrant  was radiating into the back into the infrascapular region.  On October 16 when he was in the ER he had an ultrasound that had a positive Murphy sign but no gallstones.  He had a list small liver lesion thought to be a cyst he ended up having a CT angio of the chest and abdomen and pelvis exam. Mild emphysema in the lungs  No mention of a liver problem.  Labs on that day unremarkable as well including lipase.  He has been on oxycodone 3 times daily for years.  He goes 4 to 5 days without defecation.  He is also having trouble swallowing pills for some time now.  Of also reviewed his office visit on October 20 with Dr. Martinique and a 01/30/2019 office visit with Dr. Oneida Alar regarding peripheral arterial disease.  Wt Readings from Last 3 Encounters:  02/06/19 162 lb (73.5 kg)  01/30/19 160 lb (72.6 kg)  01/21/19 160 lb 8 oz (72.8 kg)   These weights seem relatively stable for this year, most recent weights prior to that were in 2018 in 2017 when he was 79 and 76 kg respectively he is also having memory disturbance and tremor issues seeing neurology Allergies  Allergen Reactions  . Nubain [Nalbuphine Hcl]     Muscle contraction   Current Meds  Medication  Sig  . amitriptyline (ELAVIL) 50 MG tablet Take 50 mg by mouth 2 (two) times daily.  . Aspirin-Salicylamide-Caffeine (BC HEADACHE POWDER PO) Take 1 packet by mouth as needed (headache/pain).  Marland Kitchen atorvastatin (LIPITOR) 40 MG tablet Take 1 tablet (40 mg total) by mouth daily.  . baclofen (LIORESAL) 20 MG tablet Take 20 mg by mouth 2 (two) times daily.  Marland Kitchen linaclotide (LINZESS) 290 MCG CAPS capsule Take 290 mcg by mouth daily before breakfast.  . omeprazole (PRILOSEC) 40 MG capsule TAKE 1 CAPSULE BY MOUTH EVERY DAY BEFORE BREAKFAST  . oxycodone (ROXICODONE) 30 MG immediate release tablet Take 30 mg by mouth 3 (three) times daily.   . polyethylene glycol (MIRALAX / GLYCOLAX) 17 g packet Take 34 g by mouth at bedtime.  . [DISCONTINUED] linaclotide (LINZESS)  145 MCG CAPS capsule Take 1 capsule (145 mcg total) by mouth daily before breakfast.   Past Medical History:  Diagnosis Date  . Arthritis    Back  . Blood transfusion    as a child  . COPD (chronic obstructive pulmonary disease) ([place])   . Headache(784.0)    last one 6 months ago  . Hemorrhoids, internal   . HLD (hyperlipidemia)   . Hypertension   . Neck rigidity    post cervical fusion  . Nocturia   . Pneumonia   . Prostate disease    Past Surgical History:  Procedure Laterality Date  . CERVICAL FUSION  1992   C2/C 3  four surgeries  . COLONOSCOPY    . ESOPHAGOGASTRODUODENOSCOPY  07/20/2011   Procedure: ESOPHAGOGASTRODUODENOSCOPY (EGD);  Surgeon: Gatha Mayer, MD;  Location: [facility];  Service: Endoscopy;  Laterality: N/A;  . SAVORY DILATION  07/20/2011   Procedure: SAVORY DILATION;  Surgeon: Gatha Mayer, MD;  Location: [facility];  Service: Endoscopy;  Laterality: N/A;  . 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  Most of the form has been sold off.  He drinks about a liter of caffeinated drinks a day past smoker no drug use no alcohol.   family history includes Heart attack (age of onset: 57) in his mother; Heart disease in his mother; Pancreatic cancer (age of onset: 52) in his father.   Review of Systems As per HPI.  Headaches, confusion depression fatigue and anxiety.  All other review of systems negative.  Objective:   Physical Exam @BP  (!) 150/80 (BP Location: Left Arm, Patient Position: Sitting, Cuff Size: Normal)   Pulse 88   Temp 98.2 F (36.8 C)   Ht 5' 9.5" (1.765 m) Comment: height measured without shoes  Wt 162 lb (73.5 kg)   BMI 23.58 kg/m @  General:  Well-developed, well-nourished and in no acute distress Eyes:  anicteric. ENT:   Mouth and posterior pharynx free of lesions.  dentures Neck:   supple w/o thyromegaly or mass.    Lungs: Clear to auscultation bilaterally. Heart:  S1S2, no rubs, murmurs, gallops. Abdomen:  soft, non-tender, no hepatosplenomegaly, hernia, or mass and BS+.  Rectal: Tags, hemorrhoids and small amount stool which is brown Lymph:  no cervical or supraclavicular adenopathy. Extremities:   no edema, cyanosis or clubbing Skin   no rash. Neuro:  A&O x 3. Tremor - jerky mvt Psych:  appropriate mood and  Affect.   Data Reviewed: See HPI

## 2019-02-07 ENCOUNTER — Other Ambulatory Visit: Payer: Self-pay | Admitting: Family Medicine

## 2019-02-07 DIAGNOSIS — K5909 Other constipation: Secondary | ICD-10-CM

## 2019-02-07 NOTE — Telephone Encounter (Signed)
Last OV 01/21/19 Last refill - not on current med list Next OV not scheduled

## 2019-02-11 ENCOUNTER — Encounter: Payer: Self-pay | Admitting: Internal Medicine

## 2019-02-15 NOTE — Progress Notes (Signed)
Michael Bender was seen today in the movement disorders clinic for neurologic consultation at the request of Martinique, Malka So, MD.  Outside records that were made available to me were reviewed.  The consultation is for the evaluation of tremor.  This patient is accompanied in the office by his son who supplements the history.  Tremor: Yes.     How long has it been going on? 6 months, came in gradually.  Son states that it is more "fidgety."  Worse with leaving the house.  Better when at home.    At rest or with activation?  both  Fam hx of tremor?  No.  Located where?  Right > left hand  Affected by caffeine:  unknown  Affected by alcohol:  Unknown (drinks 2 shots per night)  Affected by stress:  No.  Affected by fatigue:  Yes.    Tremor inducing meds:  Yes.   but albuterol was d/c thinking that was source of tremor and tremor persisted  Noting some trouble swallowing x 6 months.  Noting memory trouble/word finding trouble that has gotten worse.     MRI brain was completed on 02/16/19 and demonstrated mod WMD.  There was an old lacune in the L thalamus. Contrasted images were negative.   I personally reviewed and interpreted this.   ALLERGIES:   Allergies  Allergen Reactions  . Nubain [Nalbuphine Hcl]     Muscle contraction    CURRENT MEDICATIONS:  Current Outpatient Medications  Medication Instructions  . amitriptyline (ELAVIL) 50 mg, Oral, 2 times daily  . Aspirin-Salicylamide-Caffeine (BC HEADACHE POWDER PO) 1 packet, Oral, As needed  . atorvastatin (LIPITOR) 40 mg, Oral, Daily  . baclofen (LIORESAL) 20 mg, Oral, 2 times daily  . linaclotide (LINZESS) 290 mcg, Oral, Daily before breakfast  . omeprazole (PRILOSEC) 40 MG capsule TAKE 1 CAPSULE BY MOUTH EVERY DAY BEFORE BREAKFAST  . oxycodone (ROXICODONE) 30 mg, Oral, 3 times daily  . polyethylene glycol (MIRALAX / GLYCOLAX) 34 g, Oral, Daily at bedtime    PHYSICAL EXAMINATION:    VITALS:   Vitals:   02/19/19 0841  BP:  136/65  Pulse: (!) 106  Resp: 18  SpO2: 97%  Weight: 154 lb (69.9 kg)  Height: 5' 10.5" (1.791 m)    GEN:  The patient appears stated age and is in NAD. HEENT:  Normocephalic, atraumatic.  The mucous membranes are moist. The superficial temporal arteries are without ropiness or tenderness. CV:  Tachy.  regular Lungs:  CTAB Neck/HEME:  There are no carotid bruits bilaterally.  Neurological examination:  Orientation: The patient is alert and oriented x3 but does look to his son for finer aspects of the history.  Cranial nerves: There is good facial symmetry.  Extraocular muscles are intact. The visual fields are full to confrontational testing. The speech is fluent and clear. Soft palate rises symmetrically and there is no tongue deviation. Hearing is intact to conversational tone. Sensation: Sensation is intact to light touch throughout (facial, trunk, extremities). Vibration is intact at the bilateral big toe. There is no extinction with double simultaneous stimulation.  Motor: Strength is 5/5 in the bilateral upper and lower extremities.   Shoulder shrug is equal and symmetric.  There is no pronator drift. Deep tendon reflexes: Deep tendon reflexes are 2-2-/4 at the bilateral biceps, triceps, brachioradialis, patella and achilles. Plantar responses are downgoing bilaterally.  Movement examination: Tone: There is tone in the bilateral upper extremities.  The tone in the lower extremities  is normal.  Abnormal movements: There is no tremor.  Patient has chorea of the arms and legs. Coordination:  There is no decremation with RAM's, but he does have choreiform movements when attempting to do rapid alternating movements Gait and Station: The patient has now difficulty arising out of a deep-seated chair without the use of the hands. The patient's stride length is good, but he is off balance because some of the choreiform movements.        Chemistry      Component Value Date/Time   NA 139  02/06/2019 1025   K 4.4 02/06/2019 1025   CL 102 02/06/2019 1025   CO2 29 02/06/2019 1025   BUN 22 02/06/2019 1025   CREATININE 1.04 02/06/2019 1025      Component Value Date/Time   CALCIUM 8.6 02/06/2019 1025   ALKPHOS 105 02/06/2019 1025   AST 37 02/06/2019 1025   ALT 13 02/06/2019 1025   BILITOT 0.4 02/06/2019 1025     Lab Results  Component Value Date   TSH 0.57 02/06/2019   Lab Results  Component Value Date   WBC 6.5 02/06/2019   HGB 13.1 02/06/2019   HCT 38.8 (L) 02/06/2019   MCV 93.8 02/06/2019   PLT 232.0 02/06/2019   No results found for: VITAMINB12   ASSESSMENT/PLAN:  1.  Chorea  -Patient does not appear to be on any medications that would cause chorea.  Denies exposure to antipsychotic medications.  Is going to need a larger work-up.  Denies any family history of chorea/Huntington's disease.  -We will do labs today including: ceruloplasmin, copper, PTH, ferritin, sedimentation rate, ANA, antiphospholipid antibody, lupus anticoagulant, RPR, B12, antigliadin antibody, Athena labs:  HD labs  -If the above is negative, we will consider paraneoplastic labs.  2.  Abnormal MRI brain  -Reviewed with the patient and his son his MRI of the brain.  It is fairly unremarkable for his age and medical problems (smoker who recently quit).  He has small vessel disease and just mild age-related atrophy.  This certainly does not explain the chorea.  Total time spent on today's visit was  60 minutes, including both face-to-face time and nonface-to-face time.  Time included that spent on review of records (prior notes available to me/labs/imaging if pertinent), discussing treatment and goals, answering patient's questions and coordinating care.  Cc:  Martinique, Betty G, MD

## 2019-02-16 ENCOUNTER — Ambulatory Visit
Admission: RE | Admit: 2019-02-16 | Discharge: 2019-02-16 | Disposition: A | Discharge status: Home / Self Care | Payer: Medicare HMO | Source: Ambulatory Visit | Attending: Family Medicine | Admitting: Family Medicine

## 2019-02-16 ENCOUNTER — Other Ambulatory Visit: Payer: Self-pay

## 2019-02-16 DIAGNOSIS — R413 Other amnesia: Secondary | ICD-10-CM

## 2019-02-16 DIAGNOSIS — R251 Tremor, unspecified: Secondary | ICD-10-CM

## 2019-02-16 DIAGNOSIS — R519 Headache, unspecified: Secondary | ICD-10-CM

## 2019-02-16 DIAGNOSIS — R200 Anesthesia of skin: Secondary | ICD-10-CM

## 2019-02-16 MED ORDER — GADOBENATE DIMEGLUMINE 529 MG/ML IV SOLN
15.0000 mL | Freq: Once | INTRAVENOUS | Status: AC | PRN
  Administered 2019-02-16: 15 mL via INTRAVENOUS

## 2019-02-18 ENCOUNTER — Encounter: Payer: Self-pay | Admitting: Family Medicine

## 2019-02-18 DIAGNOSIS — Z8673 Personal history of transient ischemic attack (TIA), and cerebral infarction without residual deficits: Secondary | ICD-10-CM | POA: Insufficient documentation

## 2019-02-18 DIAGNOSIS — I639 Cerebral infarction, unspecified: Secondary | ICD-10-CM | POA: Insufficient documentation

## 2019-02-19 ENCOUNTER — Other Ambulatory Visit: Payer: Self-pay

## 2019-02-19 ENCOUNTER — Encounter: Payer: Self-pay | Admitting: Neurology

## 2019-02-19 ENCOUNTER — Ambulatory Visit: Payer: Medicare HMO | Admitting: Neurology

## 2019-02-19 VITALS — BP 136/65 | HR 106 | Resp 18 | Ht 70.5 in | Wt 154.0 lb

## 2019-02-19 DIAGNOSIS — R413 Other amnesia: Secondary | ICD-10-CM

## 2019-02-19 DIAGNOSIS — R5383 Other fatigue: Secondary | ICD-10-CM | POA: Diagnosis not present

## 2019-02-19 DIAGNOSIS — R251 Tremor, unspecified: Secondary | ICD-10-CM | POA: Diagnosis not present

## 2019-02-19 DIAGNOSIS — R1319 Other dysphagia: Secondary | ICD-10-CM

## 2019-02-19 DIAGNOSIS — R6889 Other general symptoms and signs: Secondary | ICD-10-CM

## 2019-02-19 DIAGNOSIS — G255 Other chorea: Secondary | ICD-10-CM | POA: Diagnosis not present

## 2019-02-20 ENCOUNTER — Other Ambulatory Visit: Payer: Medicare HMO

## 2019-02-25 ENCOUNTER — Telehealth: Payer: Self-pay

## 2019-02-25 ENCOUNTER — Other Ambulatory Visit: Payer: Medicare HMO

## 2019-02-25 NOTE — Telephone Encounter (Signed)
Lab was closed the week he tried to come and have blood drawn. Patient and wife chose to go to a [facility] location. Called both numbers in patients chart and unable to reach patient on the mobile it disconnects using "google assistance" and the home number is not in service

## 2019-02-25 NOTE — Telephone Encounter (Signed)
-----   Message from [place], DO sent at 02/25/2019  7:38 AM EST ----- Can you check to see if patient went to get labs drawn after he left here or if they are just in progress?  Doesn't look like they were drawn ----- Message ----- From: SYSTEM Sent: 02/24/2019  12:06 AM EST To: Eustace Quail Tat, DO

## 2019-02-25 NOTE — Telephone Encounter (Signed)
I have to get in touch with patient [facility] was not finding information.

## 2019-02-25 NOTE — Telephone Encounter (Signed)
Called and got VM which was full so unable to LVM

## 2019-02-25 NOTE — Telephone Encounter (Signed)
Will you call [facility] and see if they have results

## 2019-02-26 ENCOUNTER — Other Ambulatory Visit: Payer: Medicare HMO

## 2019-02-26 ENCOUNTER — Other Ambulatory Visit: Payer: Self-pay

## 2019-02-26 DIAGNOSIS — R6889 Other general symptoms and signs: Secondary | ICD-10-CM | POA: Diagnosis not present

## 2019-02-26 DIAGNOSIS — R251 Tremor, unspecified: Secondary | ICD-10-CM | POA: Diagnosis not present

## 2019-02-26 DIAGNOSIS — R413 Other amnesia: Secondary | ICD-10-CM | POA: Diagnosis not present

## 2019-02-26 DIAGNOSIS — R1319 Other dysphagia: Secondary | ICD-10-CM | POA: Diagnosis not present

## 2019-02-26 DIAGNOSIS — G255 Other chorea: Secondary | ICD-10-CM | POA: Diagnosis not present

## 2019-02-26 DIAGNOSIS — D649 Anemia, unspecified: Secondary | ICD-10-CM | POA: Diagnosis not present

## 2019-02-26 DIAGNOSIS — R5383 Other fatigue: Secondary | ICD-10-CM | POA: Diagnosis not present

## 2019-03-05 ENCOUNTER — Encounter: Payer: Self-pay | Admitting: Internal Medicine

## 2019-03-05 LAB — RPR: RPR Ser Ql: NONREACTIVE

## 2019-03-05 LAB — FERRITIN: Ferritin: 61 ng/mL (ref 24–380)

## 2019-03-05 LAB — SEDIMENTATION RATE

## 2019-03-05 LAB — CERULOPLASMIN: Ceruloplasmin: 29 mg/dL (ref 18–36)

## 2019-03-05 LAB — RETICULIN ANTIBODIES, IGA W TITER: Reticulin IGA Screen: NEGATIVE

## 2019-03-05 LAB — VITAMIN B12: Vitamin B-12: 544 pg/mL (ref 200–1100)

## 2019-03-05 LAB — PTH, INTACT AND CALCIUM
Calcium: 8.5 mg/dL — ABNORMAL LOW (ref 8.6–10.3)
PTH: 53 pg/mL (ref 14–64)

## 2019-03-05 LAB — ANA: Anti Nuclear Antibody (ANA): NEGATIVE

## 2019-03-05 LAB — COPPER, SERUM: Copper: 85 ug/dL (ref 70–175)

## 2019-03-06 ENCOUNTER — Other Ambulatory Visit: Payer: Self-pay | Admitting: Internal Medicine

## 2019-03-06 ENCOUNTER — Ambulatory Visit (INDEPENDENT_AMBULATORY_CARE_PROVIDER_SITE_OTHER): Payer: Medicare HMO

## 2019-03-06 DIAGNOSIS — Z1159 Encounter for screening for other viral diseases: Secondary | ICD-10-CM

## 2019-03-06 LAB — SARS CORONAVIRUS 2 (TAT 6-24 HRS): SARS Coronavirus 2: NEGATIVE

## 2019-03-08 ENCOUNTER — Ambulatory Visit (AMBULATORY_SURGERY_CENTER): Payer: Medicare HMO | Admitting: Internal Medicine

## 2019-03-08 ENCOUNTER — Encounter: Payer: Self-pay | Admitting: Internal Medicine

## 2019-03-08 ENCOUNTER — Other Ambulatory Visit: Payer: Self-pay

## 2019-03-08 VITALS — BP 172/95 | HR 70 | Temp 96.2°F | Resp 13 | Ht 69.0 in | Wt 162.0 lb

## 2019-03-08 DIAGNOSIS — D649 Anemia, unspecified: Secondary | ICD-10-CM | POA: Diagnosis not present

## 2019-03-08 DIAGNOSIS — K2970 Gastritis, unspecified, without bleeding: Secondary | ICD-10-CM

## 2019-03-08 DIAGNOSIS — K3189 Other diseases of stomach and duodenum: Secondary | ICD-10-CM | POA: Diagnosis not present

## 2019-03-08 DIAGNOSIS — R1310 Dysphagia, unspecified: Secondary | ICD-10-CM

## 2019-03-08 DIAGNOSIS — D125 Benign neoplasm of sigmoid colon: Secondary | ICD-10-CM | POA: Diagnosis not present

## 2019-03-08 DIAGNOSIS — D124 Benign neoplasm of descending colon: Secondary | ICD-10-CM

## 2019-03-08 DIAGNOSIS — K921 Melena: Secondary | ICD-10-CM | POA: Diagnosis not present

## 2019-03-08 DIAGNOSIS — R634 Abnormal weight loss: Secondary | ICD-10-CM

## 2019-03-08 DIAGNOSIS — F1120 Opioid dependence, uncomplicated: Secondary | ICD-10-CM

## 2019-03-08 DIAGNOSIS — K5909 Other constipation: Secondary | ICD-10-CM

## 2019-03-08 DIAGNOSIS — K2950 Unspecified chronic gastritis without bleeding: Secondary | ICD-10-CM | POA: Diagnosis not present

## 2019-03-08 MED ORDER — SODIUM CHLORIDE 0.9 % IV SOLN: 500.0000 mL | Freq: Once | INTRAVENOUS | Status: DC

## 2019-03-08 NOTE — Progress Notes (Signed)
A/ox3, pleased with MAC, report to RN 

## 2019-03-08 NOTE — Patient Instructions (Addendum)
I found stomach inflammation. We call that gastritis - took biopsies looking for a cause.   Three small colon polyps removed.  Nothing anywhere that looks like cancer.  I will let you know pathology results by phone /or My Chart.  I appreciate the opportunity to care for you. Gatha Mayer, MD, [facility]  Await pathology results.  Information on polyps given to you today.  YOU HAD AN ENDOSCOPIC PROCEDURE TODAY AT [facility]:   Refer to the procedure report that was given to you for any specific questions about what was found during the examination.  If the procedure report does not answer your questions, please call your gastroenterologist to clarify.  If you requested that your care partner not be given the details of your procedure findings, then the procedure report has been included in a sealed envelope for you to review at your convenience later.  YOU SHOULD EXPECT: Some feelings of bloating in the abdomen. Passage of more gas than usual.  Walking can help get rid of the air that was put into your GI tract during the procedure and reduce the bloating. If you had a lower endoscopy (such as a colonoscopy or flexible sigmoidoscopy) you may notice spotting of blood in your stool or on the toilet paper. If you underwent a bowel prep for your procedure, you may not have a normal bowel movement for a few days.  Please Note:  You might notice some irritation and congestion in your nose or some drainage.  This is from the oxygen used during your procedure.  There is no need for concern and it should clear up in a day or so.  SYMPTOMS TO REPORT IMMEDIATELY:   Following lower endoscopy (colonoscopy or flexible sigmoidoscopy):  Excessive amounts of blood in the stool  Significant tenderness or worsening of abdominal pains  Swelling of the abdomen that is new, acute  Fever of 100F or higher   Following upper endoscopy (EGD)  Vomiting of blood or coffee ground material  New  chest pain or pain under the shoulder blades  Painful or persistently difficult swallowing  New shortness of breath  Fever of 100F or higher  Black, tarry-looking stools  For urgent or emergent issues, a gastroenterologist can be reached at any hour by calling [phone].   DIET:  We do recommend a small meal at first, but then you may proceed to your regular diet.  Drink plenty of fluids but you should avoid alcoholic beverages for 24 hours.  ACTIVITY:  You should plan to take it easy for the rest of today and you should NOT DRIVE or use heavy machinery until tomorrow (because of the sedation medicines used during the test).    FOLLOW UP: Our staff will call the number listed on your records 48-72 hours following your procedure to check on you and address any questions or concerns that you may have regarding the information given to you following your procedure. If we do not reach you, we will leave a message.  We will attempt to reach you two times.  During this call, we will ask if you have developed any symptoms of COVID 19. If you develop any symptoms (ie: fever, flu-like symptoms, shortness of breath, cough etc.) before then, please call [phone].  If you test positive for Covid 19 in the 2 weeks post procedure, please call and report this information to [place].    If any biopsies were taken you will be contacted by phone  or by letter within the next 1-3 weeks.  Please call us at [phone] if you have not heard about the biopsies in 3 weeks.    SIGNATURES/CONFIDENTIALITY: You and/or your care partner have signed paperwork which will be entered into your electronic medical record.  These signatures attest to the fact that that the information above on your After Visit Summary has been reviewed and is understood.  Full responsibility of the confidentiality of this discharge information lies with you and/or your care-partner.

## 2019-03-08 NOTE — Progress Notes (Signed)
Called to room to assist during endoscopic procedure.  Patient ID and intended procedure confirmed with present staff. Received instructions for my participation in the procedure from the performing physician.  

## 2019-03-08 NOTE — Op Note (Signed)
[place] Patient Name: Michael Bender Procedure Date: 03/08/2019 10:01 AM MRN: [id number] Endoscopist: Gatha Mayer , MD Age: 74 Referring MD:  Date of Birth: Jan 07, 1946 Gender: Male Account #: [id number] Procedure:                Upper GI endoscopy Indications:              Melena ? (black stools), Weight loss Medicines:                Propofol per Anesthesia, Monitored Anesthesia Care Procedure:                Pre-Anesthesia Assessment:                           - Prior to the procedure, a History and Physical                            was performed, and patient medications and                            allergies were reviewed. The patient's tolerance of                            previous anesthesia was also reviewed. The risks                            and benefits of the procedure and the sedation                            options and risks were discussed with the patient.                            All questions were answered, and informed consent                            was obtained. Prior Anticoagulants: The patient has                            taken no previous anticoagulant or antiplatelet                            agents. ASA Grade Assessment: II - A patient with                            mild systemic disease. After reviewing the risks                            and benefits, the patient was deemed in                            satisfactory condition to undergo the procedure.                           After obtaining informed consent, the endoscope was  passed under direct vision. Throughout the                            procedure, the patient's blood pressure, pulse, and                            oxygen saturations were monitored continuously. The                            Endoscope was introduced through the mouth, and                            advanced to the second part of duodenum. The upper        GI endoscopy was accomplished without difficulty.                            The patient tolerated the procedure well. Scope In: Scope Out: Findings:                 The examined esophagus was normal.                           Diffuse mild inflammation characterized by erythema                            and granularity was found in the entire examined                            stomach. Biopsies were taken with a cold forceps                            for histology. Verification of patient                            identification for the specimen was done. Estimated                            blood loss was minimal.                           The examined duodenum was normal.                           The cardia and gastric fundus were normal on                            retroflexion. Complications:            No immediate complications. Estimated Blood Loss:     Estimated blood loss was minimal. Impression:               - Normal esophagus.                           - Gastritis. Biopsied.                           -  Normal examined duodenum.                           - no cause of possible melena seen Recommendation:           - Patient has a contact number available for                            emergencies. The signs and symptoms of potential                            delayed complications were discussed with the                            patient. Return to normal activities tomorrow.                            Written discharge instructions were provided to the                            patient.                           - Resume previous diet.                           - Continue present medications.                           - See the other procedure note for documentation of                            additional recommendations. Gatha Mayer, MD 03/08/2019 11:14:03 AM This report has been signed electronically.

## 2019-03-08 NOTE — Op Note (Addendum)
[place] Patient Name: Michael Bender Procedure Date: 03/08/2019 10:00 AM MRN: [id number] Endoscopist: Gatha Mayer , MD Age: 74 Referring MD:  Date of Birth: 1945/06/07 Gender: Male Account #: [id number] Procedure:                Colonoscopy Indications:              Melena, Weight loss Medicines:                Propofol per Anesthesia, Monitored Anesthesia Care Procedure:                Pre-Anesthesia Assessment:                           - Prior to the procedure, a History and Physical                            was performed, and patient medications and                            allergies were reviewed. The patient's tolerance of                            previous anesthesia was also reviewed. The risks                            and benefits of the procedure and the sedation                            options and risks were discussed with the patient.                            All questions were answered, and informed consent                            was obtained. Prior Anticoagulants: The patient has                            taken no previous anticoagulant or antiplatelet                            agents. ASA Grade Assessment: II - A patient with                            mild systemic disease. After reviewing the risks                            and benefits, the patient was deemed in                            satisfactory condition to undergo the procedure.                           After obtaining informed consent, the colonoscope  was passed under direct vision. Throughout the                            procedure, the patient's blood pressure, pulse, and                            oxygen saturations were monitored continuously. The                            Colonoscope was introduced through the anus and                            advanced to the the cecum, identified by                            appendiceal orifice and  ileocecal valve. The                            colonoscopy was performed without difficulty. The                            patient tolerated the procedure well. The quality                            of the bowel preparation was good. The bowel                            preparation used was Miralax via split dose                            instruction. DOUBLE PREP Scope In: 10:22:19 AM Scope Out: 11:00:32 AM Scope Withdrawal Time: 0 hours 26 minutes 52 seconds  Total Procedure Duration: 0 hours 38 minutes 13 seconds  Findings:                 The perianal and digital rectal examinations were                            normal.                           Three sessile polyps were found in the sigmoid                            colon and descending colon. The polyps were 3 to 7                            mm in size. These polyps were removed with a cold                            snare. Resection and retrieval were complete.                            Verification of patient identification for the  specimen was done. Estimated blood loss was minimal.                           The exam was otherwise without abnormality on                            direct and retroflexion views. Complications:            No immediate complications. Estimated Blood Loss:     Estimated blood loss was minimal. Impression:               - Three 3 to 7 mm polyps in the sigmoid colon and                            in the descending colon, removed with a cold snare.                            Resected and retrieved.                           - The examination was otherwise normal on direct                            and retroflexion views. Recommendation:           - Patient has a contact number available for                            emergencies. The signs and symptoms of potential                            delayed complications were discussed with the                             patient. Return to normal activities tomorrow.                            Written discharge instructions were provided to the                            patient.                           - Resume previous diet.                           - Continue present medications.                           - No repeat colonoscopy likely due to current age                            (5 years or older). and 3 small polyps - await                            pathology [name]  Carlean Purl, MD 03/08/2019 11:17:25 AM This report has been signed electronically.

## 2019-03-08 NOTE — Progress Notes (Signed)
Vitals-CW Temp-JC  History reviewed.Poor historian.  Patient was unsure of his prep.  He had 2 different answers regarding his prep results.  States brown liquid, then stated that it was "mushy."  Patient is HOH.  Unsure when he took his medication.  Dr. Carlean Purl informed.  Will try to do procedures.  Patient does have tremors of the upper extremities.  Patient stated that he did see a "nerve doctor."

## 2019-03-12 ENCOUNTER — Telehealth: Payer: Self-pay | Admitting: *Deleted

## 2019-03-12 NOTE — Telephone Encounter (Signed)
Message left

## 2019-03-15 ENCOUNTER — Encounter: Payer: Self-pay | Admitting: Internal Medicine

## 2019-03-15 LAB — ANTIPHOSPHOLIPID SYNDROME COMP
APTT: 26.5 s
Anticardiolipin Ab, IgA: <10 [APL'U]
Anticardiolipin Ab, IgG: <10 [GPL'U]
Anticardiolipin Ab, IgM: <10 [MPL'U]
Antiphosphatidylserine IgG: 0 {GPS'U}
Antiphosphatidylserine IgM: 2 {MPS'U}
Antiprothrombin Antibody, IgG: 2 G units
Beta-2 Glycoprotein I, IgA: <10 SAU
Beta-2 Glycoprotein I, IgG: <10 SGU
Beta-2 Glycoprotein I, IgM: <10 SMU
DRVVT Screen Seconds: 39.7 s
Hexagonal Phospholipid Neutral: 0 s
Platelet Neutralization: 0 s

## 2019-03-15 LAB — LUPUS ANTICOAGULANT PANEL
Dilute Viper Venom Time: 40.1 s (ref 0.0–47.0)
PTT Lupus Anticoagulant: 32.7 s (ref 0.0–51.9)

## 2019-03-15 LAB — SPECIMEN STATUS REPORT

## 2019-03-18 ENCOUNTER — Telehealth: Payer: Self-pay | Admitting: Neurology

## 2019-03-18 NOTE — Telephone Encounter (Signed)
Will you call pt/son and find out if he got the genetic lab testing done through [facility] and if so, call them for the results and if not, find out if he is going to get that done

## 2019-03-18 NOTE — Telephone Encounter (Signed)
Pt son called no answer voice mail left for him to call back

## 2019-03-20 NOTE — Telephone Encounter (Signed)
Can you call them back again and see if you can get an answer?  Thank you!

## 2019-03-20 NOTE — Telephone Encounter (Signed)
Tried to reach patient son but no answer and unable to leave a message.   Mychart message sent to patient.

## 2019-04-10 ENCOUNTER — Other Ambulatory Visit: Payer: Self-pay | Admitting: Family Medicine

## 2019-04-10 DIAGNOSIS — J432 Centrilobular emphysema: Secondary | ICD-10-CM

## 2019-07-04 ENCOUNTER — Telehealth: Payer: Self-pay | Admitting: Family Medicine

## 2019-07-04 NOTE — Telephone Encounter (Signed)
Pt would like to know if he can have a prescription for inhaler. Pt feels like he is not having enough air when he walks. Per pt, he is not having any difficulty breathing and feels that an inhaler might work. Pt is aware that Dr. Martinique, is out of the office until 07/08/19.

## 2019-07-08 NOTE — Telephone Encounter (Signed)
Please call patient to schedule a virtual visit with Dr. Martinique. Patient has not been seen since 2020. Thanks

## 2019-07-09 ENCOUNTER — Encounter: Payer: Self-pay | Admitting: Family Medicine

## 2019-07-09 ENCOUNTER — Other Ambulatory Visit: Payer: Self-pay

## 2019-07-09 ENCOUNTER — Ambulatory Visit (INDEPENDENT_AMBULATORY_CARE_PROVIDER_SITE_OTHER): Payer: Medicare HMO | Admitting: Family Medicine

## 2019-07-09 VITALS — BP 118/60 | HR 103 | Temp 97.8°F | Wt 147.6 lb

## 2019-07-09 DIAGNOSIS — I739 Peripheral vascular disease, unspecified: Secondary | ICD-10-CM

## 2019-07-09 DIAGNOSIS — S92405A Nondisplaced unspecified fracture of left great toe, initial encounter for closed fracture: Secondary | ICD-10-CM | POA: Diagnosis not present

## 2019-07-09 DIAGNOSIS — M79672 Pain in left foot: Secondary | ICD-10-CM

## 2019-07-09 NOTE — Progress Notes (Signed)
   Subjective:    Patient ID: Michael Bender, male    DOB: [birth year], 74 y.o.   MRN: [id number]  HPI Here with his son for the onset of bruising, swelling, and pain in the left forefoot yesterday. No hx of trauma thaty he can remember, but he does tend to go barefoot around his home. He has PVD and he had arterial dopplers on 12-19-18. These showed a 50-74% stenosis in the left proximal SFA and a 75-99% stenosis in the mid SFA. He saw Dr. Ruta Hinds in December, and since he had no claudication symptoms no surgical intervention was recommended. For the past 2-3 years he has had tingling and numbness in both lower legs, and he attributes this to a lumbar spinal fusion surgery. He is not diabetic. He normally has no pain in the legs or feet when he walks, but since yesterday he feels pain in the left foot when he walks on it.    Review of Systems  Constitutional: Negative.   Respiratory: Negative.   Cardiovascular: Negative.   Musculoskeletal: Positive for arthralgias.  Neurological: Positive for numbness.       Objective:   Physical Exam Constitutional:      Comments: Walks with a slight limp   Cardiovascular:     Rate and Rhythm: Normal rate and regular rhythm.     Pulses: Normal pulses.     Heart sounds: Normal heart sounds.  Pulmonary:     Effort: Pulmonary effort is normal.     Breath sounds: Normal breath sounds.  Musculoskeletal:     Comments: The left great MTP joint is swollen and tender. ROM of the great toe is limited by pain. No crepitus. There is extensive ecchymosis around the great and second toes and the forefoot   Neurological:     Mental Status: He is alert.           Assessment & Plan:  It appears he has a fracture of the left first metatarsal or the first proximal phalanx. We will send him to the walk in clinic at [facility] for an orthopedic evaluation.  Alysia Penna, MD

## 2019-07-15 ENCOUNTER — Telehealth: Payer: Self-pay | Admitting: Family Medicine

## 2019-07-15 NOTE — Telephone Encounter (Signed)
pt trouble with dizziness  Can walk would like a call asap for the nurse [phone]

## 2019-07-16 ENCOUNTER — Encounter: Payer: Self-pay | Admitting: *Deleted

## 2019-07-16 NOTE — Telephone Encounter (Signed)
Tried calling patient, no answer, unable to leave message. 

## 2019-07-17 ENCOUNTER — Encounter: Payer: Self-pay | Admitting: Adult Health

## 2019-07-17 ENCOUNTER — Emergency Department (HOSPITAL_COMMUNITY): Payer: Medicare HMO

## 2019-07-17 ENCOUNTER — Other Ambulatory Visit: Payer: Self-pay

## 2019-07-17 ENCOUNTER — Telehealth (INDEPENDENT_AMBULATORY_CARE_PROVIDER_SITE_OTHER): Payer: Medicare HMO | Admitting: Adult Health

## 2019-07-17 ENCOUNTER — Encounter (HOSPITAL_COMMUNITY): Payer: Self-pay

## 2019-07-17 ENCOUNTER — Inpatient Hospital Stay (HOSPITAL_COMMUNITY)
Admission: EM | Admit: 2019-07-17 | Discharge: 2019-07-21 | DRG: 378 | Disposition: A | Discharge status: Home Health | Payer: Medicare HMO | Attending: Internal Medicine | Admitting: Internal Medicine

## 2019-07-17 VITALS — Wt 148.0 lb

## 2019-07-17 DIAGNOSIS — K259 Gastric ulcer, unspecified as acute or chronic, without hemorrhage or perforation: Secondary | ICD-10-CM | POA: Diagnosis present

## 2019-07-17 DIAGNOSIS — Z79891 Long term (current) use of opiate analgesic: Secondary | ICD-10-CM

## 2019-07-17 DIAGNOSIS — R0789 Other chest pain: Secondary | ICD-10-CM | POA: Diagnosis not present

## 2019-07-17 DIAGNOSIS — Z8249 Family history of ischemic heart disease and other diseases of the circulatory system: Secondary | ICD-10-CM

## 2019-07-17 DIAGNOSIS — R0600 Dyspnea, unspecified: Secondary | ICD-10-CM | POA: Diagnosis not present

## 2019-07-17 DIAGNOSIS — R0609 Other forms of dyspnea: Secondary | ICD-10-CM

## 2019-07-17 DIAGNOSIS — K264 Chronic or unspecified duodenal ulcer with hemorrhage: Principal | ICD-10-CM | POA: Diagnosis present

## 2019-07-17 DIAGNOSIS — Z66 Do not resuscitate: Secondary | ICD-10-CM | POA: Diagnosis present

## 2019-07-17 DIAGNOSIS — K315 Obstruction of duodenum: Secondary | ICD-10-CM | POA: Diagnosis present

## 2019-07-17 DIAGNOSIS — Z6820 Body mass index (BMI) 20.0-20.9, adult: Secondary | ICD-10-CM

## 2019-07-17 DIAGNOSIS — Z888 Allergy status to other drugs, medicaments and biological substances status: Secondary | ICD-10-CM

## 2019-07-17 DIAGNOSIS — G8929 Other chronic pain: Secondary | ICD-10-CM | POA: Diagnosis present

## 2019-07-17 DIAGNOSIS — G255 Other chorea: Secondary | ICD-10-CM | POA: Diagnosis present

## 2019-07-17 DIAGNOSIS — K5909 Other constipation: Secondary | ICD-10-CM | POA: Diagnosis present

## 2019-07-17 DIAGNOSIS — K269 Duodenal ulcer, unspecified as acute or chronic, without hemorrhage or perforation: Secondary | ICD-10-CM | POA: Diagnosis present

## 2019-07-17 DIAGNOSIS — E860 Dehydration: Secondary | ICD-10-CM | POA: Diagnosis present

## 2019-07-17 DIAGNOSIS — J984 Other disorders of lung: Secondary | ICD-10-CM | POA: Diagnosis not present

## 2019-07-17 DIAGNOSIS — E876 Hypokalemia: Secondary | ICD-10-CM | POA: Diagnosis present

## 2019-07-17 DIAGNOSIS — N179 Acute kidney failure, unspecified: Secondary | ICD-10-CM | POA: Diagnosis present

## 2019-07-17 DIAGNOSIS — I700 Atherosclerosis of aorta: Secondary | ICD-10-CM | POA: Diagnosis present

## 2019-07-17 DIAGNOSIS — K922 Gastrointestinal hemorrhage, unspecified: Secondary | ICD-10-CM | POA: Diagnosis present

## 2019-07-17 DIAGNOSIS — I160 Hypertensive urgency: Secondary | ICD-10-CM | POA: Diagnosis present

## 2019-07-17 DIAGNOSIS — R6881 Early satiety: Secondary | ICD-10-CM | POA: Diagnosis present

## 2019-07-17 DIAGNOSIS — Z8673 Personal history of transient ischemic attack (TIA), and cerebral infarction without residual deficits: Secondary | ICD-10-CM

## 2019-07-17 DIAGNOSIS — R634 Abnormal weight loss: Secondary | ICD-10-CM | POA: Diagnosis not present

## 2019-07-17 DIAGNOSIS — H538 Other visual disturbances: Secondary | ICD-10-CM | POA: Diagnosis not present

## 2019-07-17 DIAGNOSIS — D649 Anemia, unspecified: Secondary | ICD-10-CM | POA: Diagnosis not present

## 2019-07-17 DIAGNOSIS — E785 Hyperlipidemia, unspecified: Secondary | ICD-10-CM | POA: Diagnosis present

## 2019-07-17 DIAGNOSIS — Z8719 Personal history of other diseases of the digestive system: Secondary | ICD-10-CM | POA: Diagnosis present

## 2019-07-17 DIAGNOSIS — Z20822 Contact with and (suspected) exposure to covid-19: Secondary | ICD-10-CM | POA: Diagnosis not present

## 2019-07-17 DIAGNOSIS — K2100 Gastro-esophageal reflux disease with esophagitis, without bleeding: Secondary | ICD-10-CM | POA: Diagnosis present

## 2019-07-17 DIAGNOSIS — J449 Chronic obstructive pulmonary disease, unspecified: Secondary | ICD-10-CM | POA: Diagnosis present

## 2019-07-17 DIAGNOSIS — I10 Essential (primary) hypertension: Secondary | ICD-10-CM | POA: Diagnosis present

## 2019-07-17 DIAGNOSIS — K7689 Other specified diseases of liver: Secondary | ICD-10-CM | POA: Diagnosis present

## 2019-07-17 DIAGNOSIS — R1011 Right upper quadrant pain: Secondary | ICD-10-CM

## 2019-07-17 DIAGNOSIS — Z86010 Personal history of colonic polyps: Secondary | ICD-10-CM

## 2019-07-17 DIAGNOSIS — R079 Chest pain, unspecified: Secondary | ICD-10-CM | POA: Diagnosis not present

## 2019-07-17 DIAGNOSIS — N281 Cyst of kidney, acquired: Secondary | ICD-10-CM | POA: Diagnosis not present

## 2019-07-17 DIAGNOSIS — R112 Nausea with vomiting, unspecified: Secondary | ICD-10-CM

## 2019-07-17 DIAGNOSIS — Z981 Arthrodesis status: Secondary | ICD-10-CM

## 2019-07-17 DIAGNOSIS — R0602 Shortness of breath: Secondary | ICD-10-CM | POA: Diagnosis not present

## 2019-07-17 DIAGNOSIS — Z79899 Other long term (current) drug therapy: Secondary | ICD-10-CM

## 2019-07-17 DIAGNOSIS — D509 Iron deficiency anemia, unspecified: Secondary | ICD-10-CM | POA: Diagnosis present

## 2019-07-17 DIAGNOSIS — M549 Dorsalgia, unspecified: Secondary | ICD-10-CM | POA: Diagnosis present

## 2019-07-17 DIAGNOSIS — D500 Iron deficiency anemia secondary to blood loss (chronic): Secondary | ICD-10-CM | POA: Diagnosis present

## 2019-07-17 DIAGNOSIS — Z87891 Personal history of nicotine dependence: Secondary | ICD-10-CM

## 2019-07-17 DIAGNOSIS — K2970 Gastritis, unspecified, without bleeding: Secondary | ICD-10-CM | POA: Diagnosis present

## 2019-07-17 LAB — TROPONIN I (HIGH SENSITIVITY)
Troponin I (High Sensitivity): 10 ng/L (ref <18)
Troponin I (High Sensitivity): 11 ng/L (ref <18)

## 2019-07-17 LAB — COMPREHENSIVE METABOLIC PANEL
ALT: 10 U/L (ref 0–44)
AST: 30 U/L (ref 15–41)
Albumin: 3.2 g/dL — ABNORMAL LOW (ref 3.5–5.0)
Alkaline Phosphatase: 77 U/L (ref 38–126)
Anion gap: 13 (ref 5–15)
BUN: 25 mg/dL — ABNORMAL HIGH (ref 8–23)
CO2: 22 mmol/L (ref 22–32)
Calcium: 7.9 mg/dL — ABNORMAL LOW (ref 8.9–10.3)
Chloride: 102 mmol/L (ref 98–111)
Creatinine, Ser: 1.47 mg/dL — ABNORMAL HIGH (ref 0.61–1.24)
GFR calc Af Amer: 54 mL/min — ABNORMAL LOW (ref >60)
GFR calc non Af Amer: 46 mL/min — ABNORMAL LOW (ref >60)
Glucose, Bld: 123 mg/dL — ABNORMAL HIGH (ref 70–99)
Potassium: 3.7 mmol/L (ref 3.5–5.1)
Sodium: 137 mmol/L (ref 135–145)
Total Bilirubin: 0.5 mg/dL (ref 0.3–1.2)
Total Protein: 6 g/dL — ABNORMAL LOW (ref 6.5–8.1)

## 2019-07-17 LAB — CBC WITH DIFFERENTIAL/PLATELET
Abs Immature Granulocytes: 0.03 10*3/uL (ref 0.00–0.07)
Basophils Absolute: 0 10*3/uL (ref 0.0–0.1)
Basophils Relative: 0 %
Eosinophils Absolute: 0 10*3/uL (ref 0.0–0.5)
Eosinophils Relative: 0 %
HCT: 17.9 % — ABNORMAL LOW (ref 39.0–52.0)
Hemoglobin: 5.4 g/dL — CL (ref 13.0–17.0)
Immature Granulocytes: 0 %
Lymphocytes Relative: 12 %
Lymphs Abs: 1 10*3/uL (ref 0.7–4.0)
MCH: 30 pg (ref 26.0–34.0)
MCHC: 30.2 g/dL (ref 30.0–36.0)
MCV: 99.4 fL (ref 80.0–100.0)
Monocytes Absolute: 0.9 10*3/uL (ref 0.1–1.0)
Monocytes Relative: 10 %
Neutro Abs: 6.9 10*3/uL (ref 1.7–7.7)
Neutrophils Relative %: 78 %
Platelets: 398 10*3/uL (ref 150–400)
RBC: 1.8 MIL/uL — ABNORMAL LOW (ref 4.22–5.81)
RDW: 14.1 % (ref 11.5–15.5)
WBC: 8.8 10*3/uL (ref 4.0–10.5)
nRBC: 0 % (ref 0.0–0.2)

## 2019-07-17 LAB — HEMOGLOBIN AND HEMATOCRIT, BLOOD
HCT: 16.2 % — ABNORMAL LOW (ref 39.0–52.0)
Hemoglobin: 4.9 g/dL — CL (ref 13.0–17.0)

## 2019-07-17 LAB — POC OCCULT BLOOD, ED: Fecal Occult Bld: NEGATIVE

## 2019-07-17 LAB — BRAIN NATRIURETIC PEPTIDE: B Natriuretic Peptide: 162.8 pg/mL — ABNORMAL HIGH (ref 0.0–100.0)

## 2019-07-17 LAB — LIPASE, BLOOD: Lipase: 23 U/L (ref 11–51)

## 2019-07-17 LAB — PREPARE RBC (CROSSMATCH)

## 2019-07-17 LAB — CK: Total CK: 112 U/L (ref 49–397)

## 2019-07-17 LAB — SARS CORONAVIRUS 2 BY RT PCR (HOSPITAL ORDER, PERFORMED IN ~~LOC~~ HOSPITAL LAB): SARS Coronavirus 2: NEGATIVE

## 2019-07-17 MED ORDER — ATORVASTATIN CALCIUM 40 MG PO TABS
40.0000 mg | ORAL_TABLET | Freq: Every day | ORAL | Status: DC
  Administered 2019-07-18 – 2019-07-21 (×4): 40 mg via ORAL
  Filled 2019-07-17 (×5): qty 1

## 2019-07-17 MED ORDER — AMITRIPTYLINE HCL 25 MG PO TABS
50.0000 mg | ORAL_TABLET | ORAL | Status: DC
  Administered 2019-07-17 – 2019-07-21 (×8): 50 mg via ORAL
  Filled 2019-07-17 (×10): qty 2

## 2019-07-17 MED ORDER — ACETAMINOPHEN 650 MG RE SUPP: 650.0000 mg | Freq: Four times a day (QID) | RECTAL | Status: DC | PRN

## 2019-07-17 MED ORDER — OXYCODONE HCL 5 MG PO TABS
30.0000 mg | ORAL_TABLET | Freq: Three times a day (TID) | ORAL | Status: DC | PRN
  Administered 2019-07-17 – 2019-07-21 (×8): 30 mg via ORAL
  Filled 2019-07-17 (×8): qty 6

## 2019-07-17 MED ORDER — BACLOFEN 10 MG PO TABS
10.0000 mg | ORAL_TABLET | Freq: Two times a day (BID) | ORAL | Status: DC
  Administered 2019-07-17 – 2019-07-21 (×8): 10 mg via ORAL
  Filled 2019-07-17 (×9): qty 1

## 2019-07-17 MED ORDER — ACETAMINOPHEN 325 MG PO TABS
650.0000 mg | ORAL_TABLET | Freq: Four times a day (QID) | ORAL | Status: DC | PRN
  Administered 2019-07-18 – 2019-07-20 (×3): 650 mg via ORAL
  Filled 2019-07-17 (×3): qty 2

## 2019-07-17 MED ORDER — SODIUM CHLORIDE (PF) 0.9 % IJ SOLN
INTRAMUSCULAR | Status: AC
  Filled 2019-07-17: qty 50

## 2019-07-17 MED ORDER — SODIUM CHLORIDE 0.9 % IV BOLUS
1000.0000 mL | Freq: Once | INTRAVENOUS | Status: AC
  Administered 2019-07-17: 1000 mL via INTRAVENOUS

## 2019-07-17 MED ORDER — HYDRALAZINE HCL 20 MG/ML IJ SOLN
5.0000 mg | INTRAMUSCULAR | Status: DC | PRN
  Administered 2019-07-18 (×2): 5 mg via INTRAVENOUS
  Filled 2019-07-17 (×2): qty 1

## 2019-07-17 MED ORDER — SODIUM CHLORIDE 0.9 % IV SOLN: 10.0000 mL/h | Freq: Once | INTRAVENOUS | Status: DC

## 2019-07-17 MED ORDER — IOHEXOL 350 MG/ML SOLN
100.0000 mL | Freq: Once | INTRAVENOUS | Status: AC | PRN
  Administered 2019-07-17: 100 mL via INTRAVENOUS

## 2019-07-17 NOTE — ED Notes (Signed)
Date and time results received: 07/17/19 6:31 PM  (use smartphrase ".now" to insert current time)  Test: Hgb Critical Value: 5.4  Name of Provider Notified: Dr. Stark Jock  Orders Received? Or Actions Taken?:

## 2019-07-17 NOTE — H&P (Signed)
History and Physical    YAFET CLINE WUJ:[id number] DOB: 26-Jul-1945 DOA: 07/17/2019  PCP: [place], Betty G, MD Patient coming from: Home  Chief Complaint: Shortness of breath  HPI: Michael Bender is a 74 y.o. male with medical history significant of COPD, hypertension, hyperlipidemia, chronic back pain, gastritis, colon polyps presenting with complaints of severe exertional dyspnea and fatigue for the past few days.  States he had blood in his stool back in January but not since then.  Denies hematemesis, hematochezia, or melena.  Reports having intermittent right upper quadrant abdominal pain for a while and does not remember when exactly the pain started.  Reports early satiety and weight loss of about 40 pounds in the past 6 months.  Denies chest pain or fevers.  No other complaints.  ED Course: Blood pressure elevated with systolic in the 956O to 130Q.  Remainder of vital signs stable.  Hemoglobin 5.4, was 13.1 in January 2021. FOBT negative.  BUN 25, creatinine 1.4.  Baseline creatinine 1.0.  No significant elevation of BNP.  High-sensitivity troponin negative x2.  EKG without acute ischemic changes.  SARS-CoV-2 PCR test negative.  Chest x-ray showing no active cardiopulmonary disease.  CT angiogram negative for PE.  CT abdomen pelvis showing large stool burden, stable renal and hepatic cysts, and no acute abnormality.  Patient was given 1 L normal saline bolus. 2 units PRBCs ordered.   Review of Systems:  All systems reviewed and apart from history of presenting illness, are negative.  Past Medical History:  Diagnosis Date  . Arthritis    Back  . Blood transfusion    as a child  . Chronic back pain   . COPD (chronic obstructive pulmonary disease) ([place])   . Headache(784.0)    last one 6 months ago  . Hemorrhoids, internal   . HLD (hyperlipidemia)   . Hypertension   . Neck rigidity    post cervical fusion  . Nocturia   . Pneumonia   . Prostate disease     Past  Surgical History:  Procedure Laterality Date  . CERVICAL FUSION  1992   C2/C 3  four surgeries  . COLONOSCOPY    . ESOPHAGOGASTRODUODENOSCOPY  07/20/2011   Procedure: ESOPHAGOGASTRODUODENOSCOPY (EGD);  Surgeon: Gatha Mayer, MD;  Location: [facility];  Service: Endoscopy;  Laterality: N/A;  . SAVORY DILATION  07/20/2011   Procedure: SAVORY DILATION;  Surgeon: Gatha Mayer, MD;  Location: [facility];  Service: Endoscopy;  Laterality: N/A;  . SPINAL FUSION  05/06/11     reports that he quit smoking about 7 months ago. His smoking use included cigarettes. He quit after 40.00 years of use. He has never used smokeless tobacco. He reports current alcohol use of about 2.0 standard drinks of alcohol per week. He reports that he does not use drugs.  Allergies  Allergen Reactions  . Nubain [Nalbuphine Hcl]     Muscle contraction    Family History  Problem Relation Age of Onset  . Heart disease Mother   . Heart attack Mother 63  . Pancreatic cancer Father 50  . Pancreatic cancer Brother   . Anesthesia problems Neg Hx   . Colon cancer Neg Hx   . Liver cancer Neg Hx   . Stomach cancer Neg Hx   . Esophageal cancer Neg Hx     Prior to Admission medications   Medication Sig Start Date End Date Taking? Authorizing Provider  amitriptyline (ELAVIL) 50 MG tablet Take 50 mg by  mouth 2 (two) times daily.    [provider]  Aspirin-Salicylamide-Caffeine (BC HEADACHE POWDER PO) Take 1 packet by mouth as needed (headache/pain).    [provider]  atorvastatin (LIPITOR) 40 MG tablet Take 1 tablet (40 mg total) by mouth daily. 12/07/18   Martinique, Betty G, MD  baclofen (LIORESAL) 20 MG tablet Take 20 mg by mouth 2 (two) times daily.    [provider]  linaclotide (LINZESS) 290 MCG CAPS capsule Take 290 mcg by mouth daily before breakfast.    [provider]  omeprazole (PRILOSEC) 40 MG capsule TAKE 1 CAPSULE BY MOUTH EVERY DAY BEFORE BREAKFAST 01/11/19   Martinique,  Betty G, MD  oxycodone (ROXICODONE) 30 MG immediate release tablet Take 30 mg by mouth 3 (three) times daily.  08/21/15   [provider]  polyethylene glycol (MIRALAX / GLYCOLAX) 17 g packet Take 34 g by mouth at bedtime.    [provider]    Physical Exam: Vitals:   07/17/19 2200 07/17/19 2230 07/17/19 2322 07/17/19 2343  BP: (!) 161/85 (!) 169/70  (!) 198/77  Pulse: 84 85  87  Resp: 16 18  18   Temp:  98 F (36.7 C)  98.8 F (37.1 C)  TempSrc:  Oral  Oral  SpO2: 100% 98%  97%  Weight:   65.7 kg   Height:   5\' 11"  (1.803 m)     Physical Exam Constitutional:      General: He is not in acute distress. HENT:     Head: Normocephalic.  Eyes:     Extraocular Movements: Extraocular movements intact.  Cardiovascular:     Rate and Rhythm: Normal rate and regular rhythm.     Pulses: Normal pulses.  Pulmonary:     Effort: Pulmonary effort is normal. No respiratory distress.     Breath sounds: Normal breath sounds. No wheezing or rales.  Abdominal:     General: Bowel sounds are normal. There is no distension.     Palpations: Abdomen is soft.     Tenderness: There is no abdominal tenderness. There is no guarding.  Musculoskeletal:        General: No swelling.     Cervical back: Normal range of motion and neck supple.  Skin:    General: Skin is warm and dry.     Coloration: Skin is pale.  Neurological:     Mental Status: He is alert and oriented to person, place, and time.     Labs on Admission: I have personally reviewed following labs and imaging studies  CBC: Recent Labs  Lab 07/17/19 1728 07/17/19 2233  WBC 8.8  --   NEUTROABS 6.9  --   HGB 5.4* 4.9*  HCT 17.9* 16.2*  MCV 99.4  --   PLT 398  --    Basic Metabolic Panel: Recent Labs  Lab 07/17/19 1728  NA 137  K 3.7  CL 102  CO2 22  GLUCOSE 123*  BUN 25*  CREATININE 1.47*  CALCIUM 7.9*   GFR: Estimated Creatinine Clearance: 41 mL/min (A) (by C-G formula based on SCr of 1.47 mg/dL  (H)). Liver Function Tests: Recent Labs  Lab 07/17/19 1728  AST 30  ALT 10  ALKPHOS 77  BILITOT 0.5  PROT 6.0*  ALBUMIN 3.2*   Recent Labs  Lab 07/17/19 1728  LIPASE 23   No results for input(s): AMMONIA in the last 168 hours. Coagulation Profile: No results for input(s): INR, PROTIME in the last 168 hours.  Cardiac Enzymes: Recent Labs  Lab 07/17/19 1728  CKTOTAL 112   BNP (last 3 results) No results for input(s): PROBNP in the last 8760 hours. HbA1C: No results for input(s): HGBA1C in the last 72 hours. CBG: No results for input(s): GLUCAP in the last 168 hours. Lipid Profile: No results for input(s): CHOL, HDL, LDLCALC, TRIG, CHOLHDL, LDLDIRECT in the last 72 hours. Thyroid Function Tests: No results for input(s): TSH, T4TOTAL, FREET4, T3FREE, THYROIDAB in the last 72 hours. Anemia Panel: No results for input(s): VITAMINB12, FOLATE, FERRITIN, TIBC, IRON, RETICCTPCT in the last 72 hours. Urine analysis:    Component Value Date/Time   COLORURINE YELLOW 11/16/2018 0922   APPEARANCEUR CLEAR 11/16/2018 0922   LABSPEC 1.009 11/16/2018 0922   PHURINE 7.0 11/16/2018 0922   GLUCOSEU NEGATIVE 11/16/2018 0922   HGBUR NEGATIVE 11/16/2018 0922   BILIRUBINUR NEGATIVE 11/16/2018 0922   BILIRUBINUR n 09/08/2015 1402   KETONESUR NEGATIVE 11/16/2018 0922   PROTEINUR NEGATIVE 11/16/2018 0922   UROBILINOGEN 0.2 09/08/2015 1402   NITRITE NEGATIVE 11/16/2018 0922   LEUKOCYTESUR NEGATIVE 11/16/2018 0922    Radiological Exams on Admission: DG Chest 2 View  Result Date: 07/17/2019 CLINICAL DATA:  Chest pain and short of breath EXAM: CHEST - 2 VIEW COMPARISON:  11/16/2018 FINDINGS: The heart size and mediastinal contours are within normal limits. Aortic atherosclerosis. Both lungs are clear. The visualized skeletal structures are unremarkable. IMPRESSION: No active cardiopulmonary disease. Electronically Signed   By: Donavan Foil M.D.   On: 07/17/2019 17:31   CT Angio Chest PE W  and/or Wo Contrast  Result Date: 07/17/2019 CLINICAL DATA:  Chest pain, short of breath, anemia EXAM: CT ANGIOGRAPHY CHEST WITH CONTRAST TECHNIQUE: Multidetector CT imaging of the chest was performed using the standard protocol during bolus administration of intravenous contrast. Multiplanar CT image reconstructions and MIPs were obtained to evaluate the vascular anatomy. CONTRAST:  160mL OMNIPAQUE IOHEXOL 350 MG/ML SOLN COMPARISON:  11/16/2018 FINDINGS: Cardiovascular: This is a technically adequate evaluation of the pulmonary vasculature. No filling defects or pulmonary emboli. The heart is unremarkable without pericardial effusion. Stable thoracic aorta, with prominent atherosclerosis throughout the aortic arch. Mediastinum/Nodes: No enlarged mediastinal, hilar, or axillary lymph nodes. Thyroid gland, trachea, and esophagus demonstrate no significant findings. Lungs/Pleura: There is upper lobe predominant emphysema. No airspace disease, effusion, or pneumothorax. Central airways are patent. Upper Abdomen: Multiple left renal cysts are visualized. Please refer to corresponding CT abdomen report. Musculoskeletal: No acute or destructive bony lesions. Reconstructed images demonstrate no additional findings. Review of the MIP images confirms the above findings. IMPRESSION: 1. No evidence of pulmonary embolus. 2. Aortic Atherosclerosis (ICD10-I70.0) and Emphysema (ICD10-J43.9). Electronically Signed   By: Randa Ngo M.D.   On: 07/17/2019 20:17   CT ABDOMEN PELVIS W CONTRAST  Result Date: 07/17/2019 CLINICAL DATA:  Chest pain, short of breath, anemia EXAM: CT ABDOMEN AND PELVIS WITH CONTRAST TECHNIQUE: Multidetector CT imaging of the abdomen and pelvis was performed using the standard protocol following bolus administration of intravenous contrast. CONTRAST:  179mL OMNIPAQUE IOHEXOL 350 MG/ML SOLN COMPARISON:  11/16/2018 FINDINGS: Lower chest: Stable emphysema. Please refer to chest CT report. Hepatobiliary:  Stable hepatic cysts. No other focal liver abnormalities. The gallbladder is normal. Pancreas: There is marked pancreatic parenchymal atrophy. Parenchymal calcifications consistent with sequela of chronic calcific pancreatitis. No acute inflammatory change. Spleen: Normal in size without focal abnormality. Adrenals/Urinary Tract: Bilateral renal cysts are again noted unchanged. No urinary tract calculi or obstructive uropathy. The bladder is moderately distended  with no focal abnormalities or filling defects. The adrenals are normal. Stomach/Bowel: There is a large amount of retained stool throughout the colon. No bowel obstruction or ileus. No bowel wall thickening or inflammatory change. Vascular/Lymphatic: Aortic atherosclerosis. No enlarged abdominal or pelvic lymph nodes. Reproductive: Stable prostate calcifications. Other: No abdominal wall hernia or abnormality. No abdominopelvic ascites. Musculoskeletal: Postsurgical changes lower lumbar spine. No acute or destructive bony lesions. Progressive severe spondylosis at the L2/L3 level. Reconstructed images demonstrate no additional findings. IMPRESSION: 1. Large amount of retained stool throughout the colon. 2. Renal and hepatic cysts, unchanged. 3. Aortic Atherosclerosis (ICD10-I70.0). Electronically Signed   By: Randa Ngo M.D.   On: 07/17/2019 20:23    EKG: Independently reviewed.  Sinus rhythm, no acute ischemic changes.  Assessment/Plan Principal Problem:   Symptomatic anemia Active Problems:   Hypertensive urgency   AKI (acute kidney injury) ([facility])   HLD (hyperlipidemia)   COPD (chronic obstructive pulmonary disease) ([facility])   Symptomatic anemia: Suspect due to occult GI bleed.  Colonoscopy done in February 2021 revealed 3 sessile polyps (tubular adenoma) which were removed and EGD done at that time with evidence of gastritis.  Patient not having overt GI bleed at present.  FOBT negative.  However, hemoglobin down to 5.4 from 13.5 in January  2021.  Patient reports early satiety and per chart review has lost approximately 14 pounds since February 2021. -Type and screen, 2 units PRBCs ordered, posttransfusion H&H.  Check anemia panel.  Keep n.p.o. after midnight, consult GI in a.m.  Hypertensive urgency: Blood pressure elevated with systolic currently up to 450T.  He is not on any antihypertensives at home. -Hydralazine as needed for SBP >160  Mild AKI: Likely prerenal due to dehydration/poor oral intake.  BUN 25, creatinine 1.4.  Baseline creatinine 1.0. -IV fluid hydration.  Monitor renal function and urine output.  Avoid nephrotoxic agents.  Hyperlipidemia -Continue Lipitor  COPD: No signs of acute exacerbation at this time. -DuoNebs as needed.  Chronic back pain -Continue home oxycodone, baclofen  History of gastritis -Continue PPI  DVT prophylaxis: SCDs Code Status: Patient wishes to be DNR. Family Communication: No family available at this time. Disposition Plan: Status is: Observation  The patient remains OBS appropriate and will d/c before 2 midnights.  Dispo: The patient is from: Home              Anticipated d/c is to: Home              Anticipated d/c date is: 2 days              Patient currently is not medically stable to d/c.  The medical decision making on this patient was of high complexity and the patient is at high risk for clinical deterioration, therefore this is a level 3 visit.  Shela Leff MD [facility]  If 7PM-7AM, please contact night-coverage [email or web address]  07/18/2019, 12:05 AM

## 2019-07-17 NOTE — ED Triage Notes (Signed)
Patient had virtual visit with MD for chest pain and shob. Told to come to ED.   Brought here by son.   Patient usually able to walk but unable to today.

## 2019-07-17 NOTE — Progress Notes (Signed)
Virtual Visit via Video Note  I connected with Michael Bender on 07/17/19 at  3:30 PM EDT by a video enabled telemedicine application and verified that I am speaking with the correct person using two identifiers.  Location patient: home Location provider:work or home office Persons participating in the virtual visit: patient, provider, son  I discussed the limitations of evaluation and management by telemedicine and the availability of in person appointments. The patient expressed understanding and agreed to proceed.   HPI: 74 year old male who is being evaluated today via video visit with the complaints of shortness of breath, weight loss, right upper quadrant abdominal pain, nausea, vomiting, decreased appetite and blurred vision.  He has history of COPD but is not currently on any inhalers.  His son feels as though the shortness of breath has been getting worse especially over the last week.  He is unable to walk even 10 feet before he has to stop and lay down due to shortness of breath and fatigue.  There has not been any noticeable cough, fever, chest congestion, chills.  He was seen in emergency room in October 2020 for the same issue at which time chest x-ray did not show any active cardiopulmonary disease.  His CT scan showed mild emphysema, no evidence of pulmonary embolism, no acute cardiopulmonary disease.  During this ER visit in October 2020 he was also complaining of right upper quadrant abdominal pain.  At this time his ultrasound showed no acute findings to account for the patient's systems.  He had a small simple cyst on the left lobe of his liver that was found incidentally and mild hepatic steatosis.  CT of the abdomen showed a large stool burden throughout the colon but no acute findings in the abdomen as well.  He reports that he continues to have right upper quadrant pain that is becoming progressively worse.  He has not noticed any dark tarry stools or rectal  bleeding.  Additionally he reports decreased appetite and weight loss of 11 pounds since February.  He is experiencing nausea and vomiting on a daily basis.  No blood in vomit.  New finding that has developed over the last couple of days is that his eyesight is really blurry.  He feels as though this is a constant issue and his biggest concern today.     ROS: See pertinent positives and negatives per HPI.  Past Medical History:  Diagnosis Date  . Arthritis    Back  . Blood transfusion    as a child  . Chronic back pain   . COPD (chronic obstructive pulmonary disease) ([place])   . Headache(784.0)    last one 6 months ago  . Hemorrhoids, internal   . HLD (hyperlipidemia)   . Hypertension   . Neck rigidity    post cervical fusion  . Nocturia   . Pneumonia   . Prostate disease     Past Surgical History:  Procedure Laterality Date  . CERVICAL FUSION  1992   C2/C 3  four surgeries  . COLONOSCOPY    . ESOPHAGOGASTRODUODENOSCOPY  07/20/2011   Procedure: ESOPHAGOGASTRODUODENOSCOPY (EGD);  Surgeon: Gatha Mayer, MD;  Location: [facility];  Service: Endoscopy;  Laterality: N/A;  . SAVORY DILATION  07/20/2011   Procedure: SAVORY DILATION;  Surgeon: Gatha Mayer, MD;  Location: [facility];  Service: Endoscopy;  Laterality: N/A;  . SPINAL FUSION  05/06/11    Family History  Problem Relation Age of Onset  . Heart disease  Mother   . Heart attack Mother 61  . Pancreatic cancer Father 16  . Pancreatic cancer Brother   . Anesthesia problems Neg Hx   . Colon cancer Neg Hx   . Liver cancer Neg Hx   . Stomach cancer Neg Hx   . Esophageal cancer Neg Hx        Current Outpatient Medications:  .  amitriptyline (ELAVIL) 50 MG tablet, Take 50 mg by mouth 2 (two) times daily., Disp: , Rfl:  .  Aspirin-Salicylamide-Caffeine (BC HEADACHE POWDER PO), Take 1 packet by mouth as needed (headache/pain)., Disp: , Rfl:  .  atorvastatin (LIPITOR) 40 MG tablet, Take 1 tablet (40 mg total) by  mouth daily., Disp: 90 tablet, Rfl: 3 .  baclofen (LIORESAL) 20 MG tablet, Take 20 mg by mouth 2 (two) times daily., Disp: , Rfl:  .  linaclotide (LINZESS) 290 MCG CAPS capsule, Take 290 mcg by mouth daily before breakfast., Disp: , Rfl:  .  omeprazole (PRILOSEC) 40 MG capsule, TAKE 1 CAPSULE BY MOUTH EVERY DAY BEFORE BREAKFAST, Disp: 30 capsule, Rfl: 3 .  oxycodone (ROXICODONE) 30 MG immediate release tablet, Take 30 mg by mouth 3 (three) times daily. , Disp: , Rfl:  .  polyethylene glycol (MIRALAX / GLYCOLAX) 17 g packet, Take 34 g by mouth at bedtime., Disp: , Rfl:   EXAM:  VITALS per patient if applicable:  GENERAL: alert, oriented, appears acutely ill. Pale  HEENT: atraumatic, conjunttiva clear, no obvious abnormalities on inspection of external nose and ears  NECK: normal movements of the head and neck  LUNGS: on inspection no signs of respiratory distress, breathing rate appears normal, no obvious gross SOB, gasping or wheezing  CV: no obvious cyanosis  MS: moves all visible extremities without noticeable abnormality  PSYCH/NEURO: pleasant and cooperative, no obvious depression or anxiety, speech and thought processing grossly intact  ASSESSMENT AND PLAN:  Discussed the following assessment and plan:  Due to the patient's multiple complaints and worsening shortness of breath over the last week.  There is concern for possible PE/GI bleed/anemia/gallbladder disease.  I believe he needs further work-up and directed him and his son to go to [facility] long emergency room.  His son is in agreement and will take him via private vehicle.     I discussed the assessment and treatment plan with the patient. The patient was provided an opportunity to ask questions and all were answered. The patient agreed with the plan and demonstrated an understanding of the instructions.   The patient was advised to call back or seek an in-person evaluation if the symptoms worsen or if the condition  fails to improve as anticipated.   Dorothyann Peng, NP

## 2019-07-17 NOTE — Telephone Encounter (Signed)
Patient has virtual visit with [name] on 07/17/2019.

## 2019-07-17 NOTE — ED Provider Notes (Signed)
[facility] DEPT Provider Note   CSN: [id number] Arrival date & time: 07/17/19  1654     History Chief Complaint  Patient presents with  . Shortness of Breath  . Chest Pain    Michael Bender is a 74 y.o. male.  Patient is a 74 year old male with past medical history of COPD, chronic neck and back pain for which he is on chronic opioids.  Patient presents today for evaluation of weakness and shortness of breath.  Patient describes dyspnea on exertion and tells me he can only walk a few steps without having to stop and rest.  This seems to be an ongoing problem, but has worsened significantly recently.  He denies to me he is experiencing any chest pain, cough, or fever.  He denies any swelling in his legs.  He also reports chronic issues with his right upper quadrant.  He has pain in this area along with difficulty eating and drinking.  He tells me that even drinking water causes a burning in his stomach.  The history is provided by the patient.  Shortness of Breath Severity:  Moderate Onset quality:  Gradual Timing:  Constant Progression:  Worsening Chronicity:  Recurrent Context: activity   Context: not URI   Relieved by:  Nothing Worsened by:  Exertion and activity Associated symptoms: chest pain   Chest Pain Associated symptoms: shortness of breath        Past Medical History:  Diagnosis Date  . Arthritis    Back  . Blood transfusion    as a child  . Chronic back pain   . COPD (chronic obstructive pulmonary disease) ([place])   . Headache(784.0)    last one 6 months ago  . Hemorrhoids, internal   . HLD (hyperlipidemia)   . Hypertension   . Neck rigidity    post cervical fusion  . Nocturia   . Pneumonia   . Prostate disease     Patient Active Problem List   Diagnosis Date Noted  . CVA (cerebral vascular accident) ([place]) 02/18/2019  . Senile ecchymosis 01/21/2019  . PAD (peripheral artery disease) ([place]) 12/11/2018  .  Gastrointestinal hemorrhage with melena 11/20/2018  . Insomnia 11/20/2018  . Disturbance of skin sensation 05/22/2018  . Right sided temporal headache 05/22/2018  . Esophageal dysphagia 07/15/2011  . Bilateral leg pain 05/03/2010  . EXTRINSIC ASTHMA, UNSPECIFIED 02/23/2009  . [place] DISEASE, LUMBAR 01/01/2009  . CONSTIPATION, CHRONIC 10/07/2008  . GERD 04/04/2007  . Pulmonary emphysema ([place]) 03/26/2007  . Essential hypertension 03/05/2007  . PAIN, CHRONIC NEC 10/06/2006    Past Surgical History:  Procedure Laterality Date  . CERVICAL FUSION  1992   C2/C 3  four surgeries  . COLONOSCOPY    . ESOPHAGOGASTRODUODENOSCOPY  07/20/2011   Procedure: ESOPHAGOGASTRODUODENOSCOPY (EGD);  Surgeon: Gatha Mayer, MD;  Location: [facility];  Service: Endoscopy;  Laterality: N/A;  . SAVORY DILATION  07/20/2011   Procedure: SAVORY DILATION;  Surgeon: Gatha Mayer, MD;  Location: [facility];  Service: Endoscopy;  Laterality: N/A;  . SPINAL FUSION  05/06/11       Family History  Problem Relation Age of Onset  . Heart disease Mother   . Heart attack Mother 86  . Pancreatic cancer Father 37  . Pancreatic cancer Brother   . Anesthesia problems Neg Hx   . Colon cancer Neg Hx   . Liver cancer Neg Hx   . Stomach cancer Neg Hx   . Esophageal cancer Neg Hx  Social History   Tobacco Use  . Smoking status: Former Smoker    Years: 40.00    Types: Cigarettes    Quit date: 12/12/2018    Years since quitting: 0.5  . Smokeless tobacco: Never Used  Vaping Use  . Vaping Use: Never used  Substance Use Topics  . Alcohol use: Yes    Alcohol/week: 2.0 standard drinks    Types: 2 Shots of liquor per week  . Drug use: No    Home Medications Prior to Admission medications   Medication Sig Start Date End Date Taking? Authorizing Provider  amitriptyline (ELAVIL) 50 MG tablet Take 50 mg by mouth 2 (two) times daily.    [provider]  Aspirin-Salicylamide-Caffeine (BC HEADACHE POWDER  PO) Take 1 packet by mouth as needed (headache/pain).    [provider]  atorvastatin (LIPITOR) 40 MG tablet Take 1 tablet (40 mg total) by mouth daily. 12/07/18   Martinique, Betty G, MD  baclofen (LIORESAL) 20 MG tablet Take 20 mg by mouth 2 (two) times daily.    [provider]  linaclotide (LINZESS) 290 MCG CAPS capsule Take 290 mcg by mouth daily before breakfast.    [provider]  omeprazole (PRILOSEC) 40 MG capsule TAKE 1 CAPSULE BY MOUTH EVERY DAY BEFORE BREAKFAST 01/11/19   Martinique, Betty G, MD  oxycodone (ROXICODONE) 30 MG immediate release tablet Take 30 mg by mouth 3 (three) times daily.  08/21/15   [provider]  polyethylene glycol (MIRALAX / GLYCOLAX) 17 g packet Take 34 g by mouth at bedtime.    [provider]    Allergies    Nubain [nalbuphine hcl]  Review of Systems   Review of Systems  Respiratory: Positive for shortness of breath.   Cardiovascular: Positive for chest pain.  All other systems reviewed and are negative.   Physical Exam Updated Vital Signs BP (!) 143/85   Pulse 99   Temp 99.3 F (37.4 C) (Oral)   Resp (!) 31   SpO2 96%   Physical Exam Vitals and nursing note reviewed.  Constitutional:      General: He is not in acute distress.    Appearance: He is well-developed. He is not diaphoretic.  HENT:     Head: Normocephalic and atraumatic.     Mouth/Throat:     Comments: Mucous membranes appear dry. Eyes:     Extraocular Movements: Extraocular movements intact.     Pupils: Pupils are equal, round, and reactive to light.  Cardiovascular:     Rate and Rhythm: Normal rate and regular rhythm.     Heart sounds: No murmur heard.  No friction rub.  Pulmonary:     Effort: Pulmonary effort is normal. No respiratory distress.     Breath sounds: Normal breath sounds. No wheezing or rales.  Abdominal:     General: Bowel sounds are normal. There is no distension.     Palpations: Abdomen is soft.     Tenderness:  There is no abdominal tenderness.  Musculoskeletal:        General: Normal range of motion.     Cervical back: Normal range of motion and neck supple.     Right lower leg: No tenderness. No edema.     Left lower leg: No tenderness. No edema.  Skin:    General: Skin is warm and dry.  Neurological:     Mental Status: He is alert and oriented to person, place, and time.     Coordination: Coordination  normal.     ED Results / Procedures / Treatments   Labs (all labs ordered are listed, but only abnormal results are displayed) Labs Reviewed  SARS CORONAVIRUS 2 BY RT PCR (HOSPITAL ORDER, [facility])  COMPREHENSIVE METABOLIC PANEL  LIPASE, BLOOD  BRAIN NATRIURETIC PEPTIDE  CBC WITH DIFFERENTIAL/PLATELET  URINALYSIS, ROUTINE W REFLEX MICROSCOPIC  CK  TROPONIN I (HIGH SENSITIVITY)    EKG EKG Interpretation  Date/Time:  Wednesday July 17 2019 17:05:36 EDT Ventricular Rate:  88 PR Interval:    QRS Duration: 91 QT Interval:  401 QTC Calculation: 486 R Axis:   72 Text Interpretation: Sinus rhythm Consider left atrial enlargement Minimal ST depression, lateral leads Borderline prolonged QT interval No significant change since 11/16/2018 Confirmed by Veryl Speak [phone]) on 07/17/2019 5:49:35 PM   Radiology DG Chest 2 View  Result Date: 07/17/2019 CLINICAL DATA:  Chest pain and short of breath EXAM: CHEST - 2 VIEW COMPARISON:  11/16/2018 FINDINGS: The heart size and mediastinal contours are within normal limits. Aortic atherosclerosis. Both lungs are clear. The visualized skeletal structures are unremarkable. IMPRESSION: No active cardiopulmonary disease. Electronically Signed   By: Donavan Foil M.D.   On: 07/17/2019 17:31    Procedures Procedures (including critical care time)  Medications Ordered in ED Medications  sodium chloride 0.9 % bolus 1,000 mL (has no administration in time range)    ED Course  I have reviewed the triage vital signs and  the nursing notes.  Pertinent labs & imaging results that were available during my care of the patient were reviewed by me and considered in my medical decision making (see chart for details).    MDM Rules/Calculators/A&P  Patient with history of longstanding chronic back and neck issues as well as progressive weakness/tremor.  He presents today for evaluation of weakness and shortness of breath.  He is having severe difficulty walking even short distances.  His vital signs are stable and physical examination reveals him to be pale, but no acute findings otherwise.  Laboratory studies obtained reveal a hemoglobin of 5.4.  His rectal examination reveals heme-negative stool and I suspect a chronic anemia as opposed to acute blood loss.  His indices are otherwise unremarkable.  Remainder of his laboratory studies are unremarkable and CT scan of the abdomen and chest showed no acute abnormality.  Blood transfusion initiated and patient will be admitted to the hospitalist service under the care of Dr. Marlowe Sax.  CRITICAL CARE Performed by: Veryl Speak Total critical care time: 45 minutes Critical care time was exclusive of separately billable procedures and treating other patients. Critical care was necessary to treat or prevent imminent or life-threatening deterioration. Critical care was time spent personally by me on the following activities: development of treatment plan with patient and/or surrogate as well as nursing, discussions with consultants, evaluation of patient's response to treatment, examination of patient, obtaining history from patient or surrogate, ordering and performing treatments and interventions, ordering and review of laboratory studies, ordering and review of radiographic studies, pulse oximetry and re-evaluation of patient's condition.   Final Clinical Impression(s) / ED Diagnoses Final diagnoses:  None    Rx / DC Orders ED Discharge Orders    None       Veryl Speak, MD 07/18/19 [phone]

## 2019-07-17 NOTE — ED Notes (Signed)
Patient transported to CT 

## 2019-07-18 DIAGNOSIS — G8929 Other chronic pain: Secondary | ICD-10-CM | POA: Diagnosis not present

## 2019-07-18 DIAGNOSIS — K2970 Gastritis, unspecified, without bleeding: Secondary | ICD-10-CM | POA: Diagnosis not present

## 2019-07-18 DIAGNOSIS — I160 Hypertensive urgency: Secondary | ICD-10-CM | POA: Diagnosis present

## 2019-07-18 DIAGNOSIS — D509 Iron deficiency anemia, unspecified: Secondary | ICD-10-CM | POA: Diagnosis present

## 2019-07-18 DIAGNOSIS — R634 Abnormal weight loss: Secondary | ICD-10-CM

## 2019-07-18 DIAGNOSIS — K315 Obstruction of duodenum: Secondary | ICD-10-CM | POA: Diagnosis not present

## 2019-07-18 DIAGNOSIS — N179 Acute kidney failure, unspecified: Secondary | ICD-10-CM | POA: Diagnosis not present

## 2019-07-18 DIAGNOSIS — E785 Hyperlipidemia, unspecified: Secondary | ICD-10-CM | POA: Diagnosis not present

## 2019-07-18 DIAGNOSIS — Z981 Arthrodesis status: Secondary | ICD-10-CM | POA: Diagnosis not present

## 2019-07-18 DIAGNOSIS — R6881 Early satiety: Secondary | ICD-10-CM | POA: Diagnosis present

## 2019-07-18 DIAGNOSIS — D649 Anemia, unspecified: Secondary | ICD-10-CM | POA: Diagnosis not present

## 2019-07-18 DIAGNOSIS — I10 Essential (primary) hypertension: Secondary | ICD-10-CM | POA: Diagnosis not present

## 2019-07-18 DIAGNOSIS — E876 Hypokalemia: Secondary | ICD-10-CM | POA: Diagnosis present

## 2019-07-18 DIAGNOSIS — J449 Chronic obstructive pulmonary disease, unspecified: Secondary | ICD-10-CM | POA: Diagnosis not present

## 2019-07-18 DIAGNOSIS — G255 Other chorea: Secondary | ICD-10-CM | POA: Diagnosis present

## 2019-07-18 DIAGNOSIS — M549 Dorsalgia, unspecified: Secondary | ICD-10-CM | POA: Diagnosis present

## 2019-07-18 DIAGNOSIS — K264 Chronic or unspecified duodenal ulcer with hemorrhage: Secondary | ICD-10-CM | POA: Diagnosis not present

## 2019-07-18 DIAGNOSIS — I700 Atherosclerosis of aorta: Secondary | ICD-10-CM | POA: Diagnosis present

## 2019-07-18 DIAGNOSIS — K922 Gastrointestinal hemorrhage, unspecified: Secondary | ICD-10-CM

## 2019-07-18 DIAGNOSIS — D500 Iron deficiency anemia secondary to blood loss (chronic): Secondary | ICD-10-CM | POA: Diagnosis present

## 2019-07-18 DIAGNOSIS — Z20822 Contact with and (suspected) exposure to covid-19: Secondary | ICD-10-CM | POA: Diagnosis not present

## 2019-07-18 DIAGNOSIS — K269 Duodenal ulcer, unspecified as acute or chronic, without hemorrhage or perforation: Secondary | ICD-10-CM | POA: Diagnosis not present

## 2019-07-18 DIAGNOSIS — Z79891 Long term (current) use of opiate analgesic: Secondary | ICD-10-CM | POA: Diagnosis not present

## 2019-07-18 DIAGNOSIS — K2100 Gastro-esophageal reflux disease with esophagitis, without bleeding: Secondary | ICD-10-CM | POA: Diagnosis not present

## 2019-07-18 DIAGNOSIS — K7689 Other specified diseases of liver: Secondary | ICD-10-CM | POA: Diagnosis not present

## 2019-07-18 DIAGNOSIS — K2950 Unspecified chronic gastritis without bleeding: Secondary | ICD-10-CM | POA: Diagnosis not present

## 2019-07-18 DIAGNOSIS — K2980 Duodenitis without bleeding: Secondary | ICD-10-CM | POA: Diagnosis not present

## 2019-07-18 DIAGNOSIS — E860 Dehydration: Secondary | ICD-10-CM | POA: Diagnosis present

## 2019-07-18 DIAGNOSIS — Z8673 Personal history of transient ischemic attack (TIA), and cerebral infarction without residual deficits: Secondary | ICD-10-CM | POA: Diagnosis not present

## 2019-07-18 DIAGNOSIS — K5909 Other constipation: Secondary | ICD-10-CM | POA: Diagnosis present

## 2019-07-18 DIAGNOSIS — Z66 Do not resuscitate: Secondary | ICD-10-CM | POA: Diagnosis not present

## 2019-07-18 HISTORY — DX: Gastrointestinal hemorrhage, unspecified: K92.2

## 2019-07-18 LAB — CBC WITH DIFFERENTIAL/PLATELET
Abs Immature Granulocytes: 0.03 10*3/uL (ref 0.00–0.07)
Basophils Absolute: 0 10*3/uL (ref 0.0–0.1)
Basophils Relative: 0 %
Eosinophils Absolute: 0 10*3/uL (ref 0.0–0.5)
Eosinophils Relative: 0 %
HCT: 24.6 % — ABNORMAL LOW (ref 39.0–52.0)
Hemoglobin: 8.1 g/dL — ABNORMAL LOW (ref 13.0–17.0)
Immature Granulocytes: 0 %
Lymphocytes Relative: 14 %
Lymphs Abs: 1.4 10*3/uL (ref 0.7–4.0)
MCH: 30.2 pg (ref 26.0–34.0)
MCHC: 32.9 g/dL (ref 30.0–36.0)
MCV: 91.8 fL (ref 80.0–100.0)
Monocytes Absolute: 0.9 10*3/uL (ref 0.1–1.0)
Monocytes Relative: 9 %
Neutro Abs: 7.4 10*3/uL (ref 1.7–7.7)
Neutrophils Relative %: 77 %
Platelets: 353 10*3/uL (ref 150–400)
RBC: 2.68 MIL/uL — ABNORMAL LOW (ref 4.22–5.81)
RDW: 14.9 % (ref 11.5–15.5)
WBC: 9.8 10*3/uL (ref 4.0–10.5)
nRBC: 0 % (ref 0.0–0.2)

## 2019-07-18 LAB — CBC
HCT: 22.1 % — ABNORMAL LOW (ref 39.0–52.0)
Hemoglobin: 7.1 g/dL — ABNORMAL LOW (ref 13.0–17.0)
MCH: 29.6 pg (ref 26.0–34.0)
MCHC: 32.1 g/dL (ref 30.0–36.0)
MCV: 92.1 fL (ref 80.0–100.0)
Platelets: 333 10*3/uL (ref 150–400)
RBC: 2.4 MIL/uL — ABNORMAL LOW (ref 4.22–5.81)
RDW: 14.8 % (ref 11.5–15.5)
WBC: 6.9 10*3/uL (ref 4.0–10.5)
nRBC: 0 % (ref 0.0–0.2)

## 2019-07-18 LAB — BASIC METABOLIC PANEL
Anion gap: 10 (ref 5–15)
BUN: 15 mg/dL (ref 8–23)
CO2: 22 mmol/L (ref 22–32)
Calcium: 8 mg/dL — ABNORMAL LOW (ref 8.9–10.3)
Chloride: 105 mmol/L (ref 98–111)
Creatinine, Ser: 0.84 mg/dL (ref 0.61–1.24)
GFR calc Af Amer: >60 mL/min (ref >60)
GFR calc non Af Amer: >60 mL/min (ref >60)
Glucose, Bld: 110 mg/dL — ABNORMAL HIGH (ref 70–99)
Potassium: 3.4 mmol/L — ABNORMAL LOW (ref 3.5–5.1)
Sodium: 137 mmol/L (ref 135–145)

## 2019-07-18 LAB — VITAMIN B12: Vitamin B-12: 436 pg/mL (ref 180–914)

## 2019-07-18 LAB — ABO/RH: ABO/RH(D): O POS

## 2019-07-18 LAB — IRON AND TIBC
Iron: 30 ug/dL — ABNORMAL LOW (ref 45–182)
Saturation Ratios: 8 % — ABNORMAL LOW (ref 17.9–39.5)
TIBC: 377 ug/dL (ref 250–450)
UIBC: 347 ug/dL

## 2019-07-18 LAB — RETICULOCYTES
Immature Retic Fract: 25.3 % — ABNORMAL HIGH (ref 2.3–15.9)
RBC.: 2.69 MIL/uL — ABNORMAL LOW (ref 4.22–5.81)
Retic Count, Absolute: 112.4 10*3/uL (ref 19.0–186.0)
Retic Ct Pct: 4.2 % — ABNORMAL HIGH (ref 0.4–3.1)

## 2019-07-18 LAB — FERRITIN: Ferritin: 11 ng/mL — ABNORMAL LOW (ref 24–336)

## 2019-07-18 LAB — FOLATE: Folate: 26.6 ng/mL (ref >5.9)

## 2019-07-18 MED ORDER — CLONIDINE HCL 0.1 MG PO TABS
0.1000 mg | ORAL_TABLET | Freq: Three times a day (TID) | ORAL | Status: DC
  Administered 2019-07-18 – 2019-07-20 (×6): 0.1 mg via ORAL
  Filled 2019-07-18 (×6): qty 1

## 2019-07-18 MED ORDER — PANTOPRAZOLE SODIUM 40 MG PO TBEC
40.0000 mg | DELAYED_RELEASE_TABLET | Freq: Every day | ORAL | Status: DC
  Administered 2019-07-18: 40 mg via ORAL
  Filled 2019-07-18 (×2): qty 1

## 2019-07-18 MED ORDER — PEG-KCL-NACL-NASULF-NA ASC-C 100 G PO SOLR
0.5000 | Freq: Once | ORAL | Status: AC
  Administered 2019-07-18: 100 g via ORAL
  Filled 2019-07-18: qty 1

## 2019-07-18 MED ORDER — SODIUM CHLORIDE 0.9 % IV SOLN
510.0000 mg | Freq: Once | INTRAVENOUS | Status: AC
  Administered 2019-07-18: 510 mg via INTRAVENOUS
  Filled 2019-07-18: qty 17

## 2019-07-18 MED ORDER — SODIUM CHLORIDE 0.9 % IV SOLN: INTRAVENOUS | Status: DC

## 2019-07-18 MED ORDER — POTASSIUM CHLORIDE CRYS ER 20 MEQ PO TBCR
40.0000 meq | EXTENDED_RELEASE_TABLET | Freq: Once | ORAL | Status: AC
  Administered 2019-07-18: 40 meq via ORAL
  Filled 2019-07-18: qty 2

## 2019-07-18 MED ORDER — IPRATROPIUM-ALBUTEROL 0.5-2.5 (3) MG/3ML IN SOLN: 3.0000 mL | Freq: Four times a day (QID) | RESPIRATORY_TRACT | Status: DC | PRN

## 2019-07-18 MED ORDER — POLYETHYLENE GLYCOL 3350 17 G PO PACK
34.0000 g | PACK | Freq: Every day | ORAL | Status: DC
  Administered 2019-07-18 – 2019-07-21 (×4): 34 g via ORAL
  Filled 2019-07-18 (×4): qty 2

## 2019-07-18 MED ORDER — DEXTROSE-NACL 5-0.9 % IV SOLN: INTRAVENOUS | Status: DC

## 2019-07-18 NOTE — Progress Notes (Addendum)
Michael Bender  IFO:[id number] DOB: [birth year] DOA: 07/17/2019 PCP: Martinique, Betty G, MD    Brief Narrative:  [phone] w/ a hx of COPD HTN, HLD, chronic back pain, gastritis and colon polyps who presented with exertional dyspnea.  Patient noted transient development of melena in January but since then no hematemesis or hematochezia. He eports intermittent right upper quadrant abdominal pain.  ROS also revealed an unintentional 14 pound weight loss over the past 4 months.  In the ED the patient was found to have a Hgb of 5.4 and BUN of 25.  CT abdomen noted a large stool burden but otherwise no acute findings.  Significant Events: 6/16 admit via [facility] ED   Antimicrobials:  None    Subjective: Patient is resting comfortably in bed at this time.  He tells me he is anxious to get to the bottom of things so that he can go home.  He denies chest pain shortness of breath nausea or vomiting but does report intermittent right upper quadrant abdominal pain.  Assessment & Plan:  GIB of unclear source Colonoscopy February 2021 (Dr. Carlean Purl) revealed 3 sessile polyps (tubular adenoma) and EGD at that time noted gastritis  -GI to see in consultation - consideration being given to capsule endoscopy   Anemia of acute v/s chronic blood loss  S/p 2U PRBC thus far w/ good response -continue to monitor hemoglobin trend to assure stability  Recent Labs  Lab 07/17/19 1728 07/17/19 2233 07/18/19 0733  HGB 5.4* 4.9* 8.1*    Severe Fe deficiency Due to GI losses - dose w/ IV Fe today   HTN urgency Careful adjustment in BP meds  Mild hypokalemia Due to poor oral intake -supplement and follow  Acute kidney injury  Resolved w/ volume expansion/transfusion  Chorea  Undergoing outpt w/u per Neurology (Dr. Carles Collet)  HLD Continue usual medical treatment  COPD Well compensated at present  Chronic back pain  Controlled at this time  DVT prophylaxis: SCDs Code Status: NO CODE BLUE  Family  Communication:  Status is: Observation  The patient will require care spanning > 2 midnights and should be moved to inpatient because: Inpatient level of care appropriate due to severity of illness  Dispo: The patient is from: Home              Anticipated d/c is to: Home              Anticipated d/c date is: 2 days              Patient currently is not medically stable to d/c.   The patient presented with profound anemia with a hemoglobin of 4.9.  His hemoglobin trend will need to be followed for at least 72 hours to assure it is stable before it is clear that he is safe for discharge home.  Furthermore he will likely need endoscopic evaluation in an attempt to identify the source of his ongoing blood loss.  Consultants:  [facility]  Objective: Blood pressure (!) 175/68, pulse 92, temperature 98.4 F (36.9 C), temperature source Oral, resp. rate 19, height 5\' 11"  (1.803 m), weight 65.7 kg, SpO2 99 %.  Intake/Output Summary (Last 24 hours) at 07/18/2019 1127 Last data filed at 07/18/2019 0612 Gross per 24 hour  Intake 2340.31 ml  Output 1400 ml  Net 940.31 ml   Filed Weights   07/17/19 2322  Weight: 65.7 kg    Examination: General: No acute respiratory distress Lungs: Clear to auscultation bilaterally without  wheezes or crackles Cardiovascular: Regular rate and rhythm without murmur gallop or rub normal S1 and S2 Abdomen: Mildly tender in right upper quadrant, no rebound, no mass, bowel sounds positive Extremities: No significant cyanosis, clubbing, or edema bilateral lower extremities  CBC: Recent Labs  Lab 07/17/19 1728 07/17/19 2233 07/18/19 0733  WBC 8.8  --  9.8  NEUTROABS 6.9  --  7.4  HGB 5.4* 4.9* 8.1*  HCT 17.9* 16.2* 24.6*  MCV 99.4  --  91.8  PLT 398  --  220   Basic Metabolic Panel: Recent Labs  Lab 07/17/19 1728 07/18/19 0733  NA 137 137  K 3.7 3.4*  CL 102 105  CO2 22 22  GLUCOSE 123* 110*  BUN 25* 15  CREATININE 1.47* 0.84  CALCIUM 7.9* 8.0*    GFR: Estimated Creatinine Clearance: 71.7 mL/min (by C-G formula based on SCr of 0.84 mg/dL).  Liver Function Tests: Recent Labs  Lab 07/17/19 1728  AST 30  ALT 10  ALKPHOS 77  BILITOT 0.5  PROT 6.0*  ALBUMIN 3.2*   Recent Labs  Lab 07/17/19 1728  LIPASE 23    Cardiac Enzymes: Recent Labs  Lab 07/17/19 1728  CKTOTAL 112    HbA1C: Hemoglobin A1C  Date/Time Value Ref Range Status  11/20/2018 09:43 AM 5.1 4.0 - 5.6 % Final     Recent Results (from the past 240 hour(s))  SARS Coronavirus 2 by RT PCR (hospital order, performed in [facility] hospital lab) Nasopharyngeal Nasopharyngeal Swab     Status: None   Collection Time: 07/17/19  5:55 PM   Specimen: Nasopharyngeal Swab  Result Value Ref Range Status   SARS Coronavirus 2 NEGATIVE NEGATIVE Final    Comment: (NOTE) SARS-CoV-2 target nucleic acids are NOT DETECTED.  The SARS-CoV-2 RNA is generally detectable in upper and lower respiratory specimens during the acute phase of infection. The lowest concentration of SARS-CoV-2 viral copies this assay can detect is 250 copies / mL. A negative result does not preclude SARS-CoV-2 infection and should not be used as the sole basis for treatment or other patient management decisions.  A negative result may occur with improper specimen collection / handling, submission of specimen other than nasopharyngeal swab, presence of viral mutation(s) within the areas targeted by this assay, and inadequate number of viral copies (<250 copies / mL). A negative result must be combined with clinical observations, patient history, and epidemiological information.  Fact Sheet for Patients:   StrictlyIdeas.no  Fact Sheet for Healthcare Providers: BankingDealers.co.za  This test is not yet approved or  cleared by the [place] FDA and has been authorized for detection and/or diagnosis of SARS-CoV-2 by FDA under an Emergency Use  Authorization (EUA).  This EUA will remain in effect (meaning this test can be used) for the duration of the COVID-19 declaration under Section 564(b)(1) of the Act, 21 U.S.C. section 360bbb-3(b)(1), unless the authorization is terminated or revoked sooner.  Performed at [facility], [place] [address], [place], [place] [address]      Scheduled Meds: . amitriptyline  50 mg Oral 3 times per day  . atorvastatin  40 mg Oral Daily  . baclofen  10 mg Oral BID  . pantoprazole  40 mg Oral Daily   Continuous Infusions: . sodium chloride    . sodium chloride 125 mL/hr at 07/18/19 0844     LOS: 0 days   Cherene Altes, MD [facility] Office  [phone] Pager - Text Page per Shea Evans  If 7PM-7AM, please contact night-coverage per Amion 07/18/2019, 11:27 AM

## 2019-07-18 NOTE — Progress Notes (Signed)
CRITICAL VALUE ALERT  Critical Value:  hgb 4.9  Date & Time Notied: 07/17/19@2302   Provider Notified: Yes  Orders Received/Actions taken: Aware

## 2019-07-18 NOTE — Progress Notes (Signed)
   07/18/19 0011  Assess: MEWS Score  Temp 98.6 F (37 C)  BP (!) 201/76  Pulse Rate 85  Resp 18  SpO2 98 %  O2 Device Room Air  Assess: MEWS Score  MEWS Temp 0  MEWS Systolic 2  MEWS Pulse 0  MEWS RR 0  MEWS LOC 0  MEWS Score 2  MEWS Score Color Yellow  Assess: if the MEWS score is Yellow or Red  Were vital signs taken at a resting state? Yes  Focused Assessment Documented focused assessment  MEWS guidelines implemented *See Row Information* Yes  Treat  MEWS Interventions Administered prn meds/treatments  Take Vital Signs  Increase Vital Sign Frequency  Yellow: Q 2hr X 2 then Q 4hr X 2, if remains yellow, continue Q 4hrs  Escalate  MEWS: Escalate Yellow: discuss with charge nurse/RN and consider discussing with provider and RRT  Notify: Charge Nurse/RN  Name of Charge Nurse/RN Notified Myriam Jacobson, RN  Date Charge Nurse/RN Notified 07/18/19  Time Charge Nurse/RN Notified 0045

## 2019-07-18 NOTE — Progress Notes (Signed)
Patient completed 2 units  PRBC at this time, tolerated well.

## 2019-07-18 NOTE — H&P (View-Only) (Signed)
Referring Provider:  Dr. Joette Catching Primary Care Physician:  Martinique, Betty G, MD Primary Gastroenterologist:  Dr. Carlean Purl.   Reason for Consultation:  GI bleed, melena   HPI: Michael Bender is a 74 y.o. male with a past medical history of hypertension, CVA, hyperlipidemia, COPD, chronic neck and back pain on chronic opioids, GERD, IDA and colon polyps. He was last seen by Dr. Carlean Purl 02/06/2019 for further evaluation for melena and weight loss. He underwent an EGD and colonoscopy by Dr. Carlean Purl 03/08/2019.  The EGD showed gastritis, no evidence of H. pylori. The colonoscopy identified 3 tubular adenomatous polyps otherwise was normal. No etiology for his melena or anemia was identified.   He presented to [facility] emergency room on 07/17/2019 with dyspnea with exertion with concerns of continued weight loss. Labs in the ED showed hemoglobin of 5.4 -> 4.9.  He received 2 units of packed red blood cells. Post transfusion hemoglobin 8.1. Hematocrit 24.6. Iron 30. Ferritin 11. FOBT w as negative. A chest angiogram was negative. CTAP showed stool filled colon, renal and hepatic cysts.   He complains of having persistent RUQ pain which started 11/2018. An abdominal sonogram 10/20202 did not show any evidence of gallstones. Eating does not worsen or improve his pain. He has intermittent nausea, no vomiting. He denies having chest pain. He reports having dyspnea with walking 6 steps for the past few week. No cough or hemoptysis. He is taking Omeprazole 39m once daily which controls his reflux symptoms. No dysphagia or heartburn. He takes [facility] one packet every other day for headaches for years. No other NSAID use. He is passing a normal brown stool daily. No further melena since Jan. 2021. No rectal bleeding. No lower abdominal pain. He reports losing almost 40lbs over the past 8 months. No family history of gastric or colon cancer. Father had pancreatic cancer.   ED course: Sodium 137.   Potassium 3.7.  Glucose 123.  BUN 25.  Creatinine 1.47.  Alk phos 77.  Albumin 3.2.  Lipase 23.  AST 30.  ALT 10.  Total bili 0.5.  WBC 8.8.  Hemoglobin 5.4 ->4.9.  Hematocrit 17.9.  MCV 99.4.  Platelet 398.  BNP 162.8.  CK 112.  Troponin x 2 negative. EKG without acute ischemic changes. Sars Coronavirus 2 negative. FOBT negative.   Labs 07/18/2019: Iron 30. TIBC 377. Ferritin 11. B12 436. Retic Count 4.2.  Chest x-ray: No active cardiopulmonary disease.  Chest CT angiogram 07/17/2019: 1. No evidence of pulmonary embolus. 2. Aortic Atherosclerosis   Abdominal/pelvic CT with contrast 07/17/2019: 1. Large amount of retained stool throughout the colon. 2. Renal and hepatic cysts, unchanged. 3. Aortic Atherosclerosis   EGD 03/08/2019 due to weight loss and melena: - Normal esophagus. - Gastritis. Biopsied. - Normal examined duodenum. - no cause of possible melena seen 1. Surgical [P], gastric antrum and gastric body - GASTRIC ANTRAL MUCOSA WITH REACTIVE GASTROPATHY. - GASTRIC OXYNTIC MUCOSA WITH MILD CHRONIC GASTRITIS. - WARTHIN-STARRY STAIN IS NEGATIVE FOR HELICOBACTER PYLORI.  Colonoscopy 03/08/2019 due to weight loss and melena: - Three 3 to 7 mm tubular adenomatous polyps in the sigmoid colon and in the descending colon, removed with a cold snare. Resected and retrieved. - The examination was otherwise normal on direct and retroflexion views.  Abdominal sonogram 11/16/2018: 1. No acute findings to account for the patient's symptoms. Specifically, no cholelithiasis or findings to suggest an acute cholecystitis. 2. Small simple cyst in the left lobe of the liver  incidentally noted. 3. Mild hepatic steatosis.   Past Medical History:  Diagnosis Date  . Arthritis    Back  . Blood transfusion    as a child  . Chronic back pain   . COPD (chronic obstructive pulmonary disease) (HCC)   . Headache(784.0)    last one 6 months ago  . Hemorrhoids, internal   . HLD (hyperlipidemia)     . Hypertension   . Neck rigidity    post cervical fusion  . Nocturia   . Pneumonia   . Prostate disease     Past Surgical History:  Procedure Laterality Date  . CERVICAL FUSION  1992   C2/C 3  four surgeries  . COLONOSCOPY    . ESOPHAGOGASTRODUODENOSCOPY  07/20/2011   Procedure: ESOPHAGOGASTRODUODENOSCOPY (EGD);  Surgeon: Carl E Gessner, MD;  Location: [facility];  Service: Endoscopy;  Laterality: N/A;  . SAVORY DILATION  07/20/2011   Procedure: SAVORY DILATION;  Surgeon: Carl E Gessner, MD;  Location: [facility];  Service: Endoscopy;  Laterality: N/A;  . SPINAL FUSION  05/06/11    Prior to Admission medications   Medication Sig Start Date End Date Taking? Authorizing Provider  ADVAIR DISKUS 250-50 MCG/DOSE AEPB Inhale 1 puff into the lungs 2 (two) times daily as needed (sob/wheezing).  03/16/19  Yes [provider]  amitriptyline (ELAVIL) 50 MG tablet Take 50 mg by mouth in the morning, at noon, and at bedtime.    Yes [provider]  Aspirin-Salicylamide-Caffeine (BC HEADACHE POWDER PO) Take 1 packet by mouth as needed (headache/pain).   Yes [provider]  atorvastatin (LIPITOR) 40 MG tablet Take 1 tablet (40 mg total) by mouth daily. 12/07/18  Yes Jordan, Betty G, MD  baclofen (LIORESAL) 10 MG tablet Take 10 mg by mouth 2 (two) times daily.  07/11/19  Yes [provider]  oxycodone (ROXICODONE) 30 MG immediate release tablet Take 30 mg by mouth 3 (three) times daily.  08/21/15  Yes [provider]  polyethylene glycol (MIRALAX / GLYCOLAX) 17 g packet Take 34 g by mouth daily.    Yes [provider]  omeprazole (PRILOSEC) 40 MG capsule TAKE 1 CAPSULE BY MOUTH EVERY DAY BEFORE BREAKFAST Patient not taking: Reported on 07/17/2019 01/11/19   Jordan, Betty G, MD    Current Facility-Administered Medications  Medication Dose Route Frequency Provider Last Rate Last Admin  . 0.9 %  sodium chloride infusion  10 mL/hr Intravenous Once  Rathore, Vasundhra, MD      . 0.9 %  sodium chloride infusion   Intravenous Continuous Rathore, Vasundhra, MD 125 mL/hr at 07/18/19 0844 New Bag at 07/18/19 0844  . acetaminophen (TYLENOL) tablet 650 mg  650 mg Oral Q6H PRN Rathore, Vasundhra, MD       Or  . acetaminophen (TYLENOL) suppository 650 mg  650 mg Rectal Q6H PRN Rathore, Vasundhra, MD      . amitriptyline (ELAVIL) tablet 50 mg  50 mg Oral 3 times per day Rathore, Vasundhra, MD   50 mg at 07/17/19 2248  . atorvastatin (LIPITOR) tablet 40 mg  40 mg Oral Daily Rathore, Vasundhra, MD      . baclofen (LIORESAL) tablet 10 mg  10 mg Oral BID Rathore, Vasundhra, MD   10 mg at 07/18/19 0833  . hydrALAZINE (APRESOLINE) injection 5 mg  5 mg Intravenous Q4H PRN Rathore, Vasundhra, MD   5 mg at 07/18/19 0610  . ipratropium-albuterol (DUONEB) 0.5-2.5 (3) MG/3ML nebulizer solution 3 mL  3 mL Nebulization   Q6H PRN Rathore, Vasundhra, MD      . oxyCODONE (Oxy IR/ROXICODONE) immediate release tablet 30 mg  30 mg Oral Q8H PRN Rathore, Vasundhra, MD   30 mg at 07/18/19 0834  . pantoprazole (PROTONIX) EC tablet 40 mg  40 mg Oral Daily Rathore, Vasundhra, MD   40 mg at 07/18/19 0836    Allergies as of 07/17/2019 - Review Complete 07/17/2019  Allergen Reaction Noted  . Nubain [nalbuphine hcl]  03/01/2011    Family History  Problem Relation Age of Onset  . Heart disease Mother   . Heart attack Mother 57  . Pancreatic cancer Father 80  . Pancreatic cancer Brother   . Anesthesia problems Neg Hx   . Colon cancer Neg Hx   . Liver cancer Neg Hx   . Stomach cancer Neg Hx   . Esophageal cancer Neg Hx     Social History   Socioeconomic History  . Marital status: Divorced    Spouse name: Not on file  . Number of children: 2  . Years of education: Not on file  . Highest education level: Not on file  Occupational History  . Occupation: Disabled  Tobacco Use  . Smoking status: Former Smoker    Years: 40.00    Types: Cigarettes    Quit date:  12/12/2018    Years since quitting: 0.5  . Smokeless tobacco: Never Used  Vaping Use  . Vaping Use: Never used  Substance and Sexual Activity  . Alcohol use: Yes    Alcohol/week: 2.0 standard drinks    Types: 2 Shots of liquor per week  . Drug use: No  . Sexual activity: Not on file  Other Topics Concern  . 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  Most of the form has been sold off.  He drinks about a liter of caffeinated drinks a day past smoker no drug use no alcohol.   Social Determinants of Health   Financial Resource Strain:   . Difficulty of Paying Living Expenses:   Food Insecurity:   . Worried About Running Out of Food in the Last Year:   . Ran Out of Food in the Last Year:   Transportation Needs:   . Lack of Transportation (Medical):   . Lack of Transportation (Non-Medical):   Physical Activity:   . Days of Exercise per Week:   . Minutes of Exercise per Session:   Stress:   . Feeling of Stress :   Social Connections:   . Frequency of Communication with Friends and Family:   . Frequency of Social Gatherings with Friends and Family:   . Attends Religious Services:   . Active Member of Clubs or Organizations:   . Attends Club or Organization Meetings:   . Marital Status:   Intimate Partner Violence:   . Fear of Current or Ex-Partner:   . Emotionally Abused:   . Physically Abused:   . Sexually Abused:     Review of Systems:  See HPI, all other systems reviewed and are negative   Physical Exam: Vital signs in last 24 hours: Temp:  [98 F (36.7 C)-99.3 F (37.4 C)] 98.4 F (36.9 C) (06/17 0832) Pulse Rate:  [76-99] 92 (06/17 0832) Resp:  [11-31] 19 (06/17 0832) BP: (119-201)/(53-99) 175/68 (06/17 0832) SpO2:  [92 %-100 %] 99 % (06/17 0832) Weight:  [65.7 kg-67.1 kg] 65.7 kg (06/16 2322)   Last BM Date: 07/14/19 (not 100% sure) General:  Fatigued appearing male cooperative in  NAD. Head:  Normocephalic and atraumatic. Eyes:  No scleral icterus. Conjunctiva pink. Ears:  Normal auditory acuity. Nose:  No deformity, discharge or lesions. Mouth: Upper and lower dentures intact.  No ulcers or lesions.  Neck:  Supple. No lymphadenopathy or thyromegaly.  Lungs: Breath sounds clear throughout.    Heart:  RRR, no murmur. Abdomen:  Soft, nontender, nondistended. + BS x 4 quads. No HSM. Rectal: Deferred. Musculoskeletal:  Symmetrical without gross deformities.  Pulses:  Normal pulses noted. Extremities:  Without clubbing or edema. Neurologic:  Alert and  oriented x4. No focal deficits.  Skin:  Intact without significant lesions or rashes. Psych:  Alert and cooperative. Normal mood and affect.  Intake/Output from previous day: 06/16 0701 - 06/17 0700 In: 2340.3 [I.V.:650.3; Blood:690; IV Piggyback:1000] Out: 1400 [Urine:1400] Intake/Output this shift: No intake/output data recorded.  Lab Results: Recent Labs    07/17/19 1728 07/17/19 2233 07/18/19 0733  WBC 8.8  --  9.8  HGB 5.4* 4.9* 8.1*  HCT 17.9* 16.2* 24.6*  PLT 398  --  353   BMET Recent Labs    07/17/19 1728 07/18/19 0733  NA 137 137  K 3.7 3.4*  CL 102 105  CO2 22 22  GLUCOSE 123* 110*  BUN 25* 15  CREATININE 1.47* 0.84  CALCIUM 7.9* 8.0*   LFT Recent Labs    07/17/19 1728  PROT 6.0*  ALBUMIN 3.2*  AST 30  ALT 10  ALKPHOS 77  BILITOT 0.5   PT/INR No results for input(s): LABPROT, INR in the last 72 hours. Hepatitis Panel No results for input(s): HEPBSAG, HCVAB, HEPAIGM, HEPBIGM in the last 72 hours.    Studies/Results: DG Chest 2 View  Result Date: 07/17/2019 CLINICAL DATA:  Chest pain and short of breath EXAM: CHEST - 2 VIEW COMPARISON:  11/16/2018 FINDINGS: The heart size and mediastinal contours are within normal limits. Aortic atherosclerosis. Both lungs are clear. The visualized skeletal structures are unremarkable. IMPRESSION: No active cardiopulmonary disease.  Electronically Signed   By: Kim  Fujinaga M.D.   On: 07/17/2019 17:31   CT Angio Chest PE W and/or Wo Contrast  Result Date: 07/17/2019 CLINICAL DATA:  Chest pain, short of breath, anemia EXAM: CT ANGIOGRAPHY CHEST WITH CONTRAST TECHNIQUE: Multidetector CT imaging of the chest was performed using the standard protocol during bolus administration of intravenous contrast. Multiplanar CT image reconstructions and MIPs were obtained to evaluate the vascular anatomy. CONTRAST:  100mL OMNIPAQUE IOHEXOL 350 MG/ML SOLN COMPARISON:  11/16/2018 FINDINGS: Cardiovascular: This is a technically adequate evaluation of the pulmonary vasculature. No filling defects or pulmonary emboli. The heart is unremarkable without pericardial effusion. Stable thoracic aorta, with prominent atherosclerosis throughout the aortic arch. Mediastinum/Nodes: No enlarged mediastinal, hilar, or axillary lymph nodes. Thyroid gland, trachea, and esophagus demonstrate no significant findings. Lungs/Pleura: There is upper lobe predominant emphysema. No airspace disease, effusion, or pneumothorax. Central airways are patent. Upper Abdomen: Multiple left renal cysts are visualized. Please refer to corresponding CT abdomen report. Musculoskeletal: No acute or destructive bony lesions. Reconstructed images demonstrate no additional findings. Review of the MIP images confirms the above findings. IMPRESSION: 1. No evidence of pulmonary embolus. 2. Aortic Atherosclerosis (ICD10-I70.0) and Emphysema (ICD10-J43.9). Electronically Signed   By: Michael  Brown M.D.   On: 07/17/2019 20:17   CT ABDOMEN PELVIS W CONTRAST  Result Date: 07/17/2019 CLINICAL DATA:  Chest pain, short of breath, anemia EXAM: CT ABDOMEN   AND PELVIS WITH CONTRAST TECHNIQUE: Multidetector CT imaging of the abdomen and pelvis was performed using the standard protocol following bolus administration of intravenous contrast. CONTRAST:  100mL OMNIPAQUE IOHEXOL 350 MG/ML SOLN COMPARISON:   11/16/2018 FINDINGS: Lower chest: Stable emphysema. Please refer to chest CT report. Hepatobiliary: Stable hepatic cysts. No other focal liver abnormalities. The gallbladder is normal. Pancreas: There is marked pancreatic parenchymal atrophy. Parenchymal calcifications consistent with sequela of chronic calcific pancreatitis. No acute inflammatory change. Spleen: Normal in size without focal abnormality. Adrenals/Urinary Tract: Bilateral renal cysts are again noted unchanged. No urinary tract calculi or obstructive uropathy. The bladder is moderately distended with no focal abnormalities or filling defects. The adrenals are normal. Stomach/Bowel: There is a large amount of retained stool throughout the colon. No bowel obstruction or ileus. No bowel wall thickening or inflammatory change. Vascular/Lymphatic: Aortic atherosclerosis. No enlarged abdominal or pelvic lymph nodes. Reproductive: Stable prostate calcifications. Other: No abdominal wall hernia or abnormality. No abdominopelvic ascites. Musculoskeletal: Postsurgical changes lower lumbar spine. No acute or destructive bony lesions. Progressive severe spondylosis at the L2/L3 level. Reconstructed images demonstrate no additional findings. IMPRESSION: 1. Large amount of retained stool throughout the colon. 2. Renal and hepatic cysts, unchanged. 3. Aortic Atherosclerosis (ICD10-I70.0). Electronically Signed   By: Michael  Brown M.D.   On: 07/17/2019 20:23    IMPRESSION/PLAN:  1. 73-year-old male with profound IDA and weight loss. Admission hemoglobin 5.7. Transfused 2 units of PRBCs. Hemoglobin up to 8.1. Iron 30. TIBC 377. Ferritin 11. B12 436. FOBT negative. EGD/colonoscopy 03/08/2019 did not explain IDA/melena.  -Most likely will require small bowel capsule endoscopy or small bowel enteroscopy  further recommendations per Dr. Cirigliano. -Monitor H/H closely -Monitor for active GI bleeding -Consider IV iron during hospital admission  -Repeat CBC in  am, will check celiac serology   2. RUQ abdominal pain, chronic.  -See plan in # 1 -Pain management per the hospitalist    3. History of GERD -Continue PPI po QD  4. History of colon polyps. Last colonoscopy 03/2019 three TA polyps removed. No further colonoscopies recommended due to age.  5. Chronic constipation  -Miralax Q HS PRN    Montie Gelardi M Kennedy-Smith  07/18/2019, 11:42 AM    cirig  

## 2019-07-18 NOTE — Consult Note (Addendum)
Referring Provider:  Dr. Joette Catching Primary Care Physician:  Martinique, Betty G, MD Primary Gastroenterologist:  Dr. Carlean Purl.   Reason for Consultation:  GI bleed, melena   HPI: Michael Bender is a 73 y.o. male with a past medical history of hypertension, CVA, hyperlipidemia, COPD, chronic neck and back pain on chronic opioids, GERD, IDA and colon polyps. He was last seen by Dr. Carlean Purl 02/06/2019 for further evaluation for melena and weight loss. He underwent an EGD and colonoscopy by Dr. Carlean Purl 03/08/2019.  The EGD showed gastritis, no evidence of H. pylori. The colonoscopy identified 3 tubular adenomatous polyps otherwise was normal. No etiology for his melena or anemia was identified.   He presented to [facility] emergency room on 07/17/2019 with dyspnea with exertion with concerns of continued weight loss. Labs in the ED showed hemoglobin of 5.4 -> 4.9.  He received 2 units of packed red blood cells. Post transfusion hemoglobin 8.1. Hematocrit 24.6. Iron 30. Ferritin 11. FOBT w as negative. A chest angiogram was negative. CTAP showed stool filled colon, renal and hepatic cysts.   He complains of having persistent RUQ pain which started 11/2018. An abdominal sonogram 10/20202 did not show any evidence of gallstones. Eating does not worsen or improve his pain. He has intermittent nausea, no vomiting. He denies having chest pain. He reports having dyspnea with walking 6 steps for the past few week. No cough or hemoptysis. He is taking Omeprazole 39m once daily which controls his reflux symptoms. No dysphagia or heartburn. He takes [facility]der one packet every other day for headaches for years. No other NSAID use. He is passing a normal brown stool daily. No further melena since Jan. 2021. No rectal bleeding. No lower abdominal pain. He reports losing almost 40lbs over the past 8 months. No family history of gastric or colon cancer. Father had pancreatic cancer.   ED course: Sodium 137.   Potassium 3.7.  Glucose 123.  BUN 25.  Creatinine 1.47.  Alk phos 77.  Albumin 3.2.  Lipase 23.  AST 30.  ALT 10.  Total bili 0.5.  WBC 8.8.  Hemoglobin 5.4 ->4.9.  Hematocrit 17.9.  MCV 99.4.  Platelet 398.  BNP 162.8.  CK 112.  Troponin x 2 negative. EKG without acute ischemic changes. Sars Coronavirus 2 negative. FOBT negative.   Labs 07/18/2019: Iron 30. TIBC 377. Ferritin 11. B12 436. Retic Count 4.2.  Chest x-ray: No active cardiopulmonary disease.  Chest CT angiogram 07/17/2019: 1. No evidence of pulmonary embolus. 2. Aortic Atherosclerosis   Abdominal/pelvic CT with contrast 07/17/2019: 1. Large amount of retained stool throughout the colon. 2. Renal and hepatic cysts, unchanged. 3. Aortic Atherosclerosis   EGD 03/08/2019 due to weight loss and melena: - Normal esophagus. - Gastritis. Biopsied. - Normal examined duodenum. - no cause of possible melena seen 1. Surgical [P], gastric antrum and gastric body - GASTRIC ANTRAL MUCOSA WITH REACTIVE GASTROPATHY. - GASTRIC OXYNTIC MUCOSA WITH MILD CHRONIC GASTRITIS. - WARTHIN-STARRY STAIN IS NEGATIVE FOR HELICOBACTER PYLORI.  Colonoscopy 03/08/2019 due to weight loss and melena: - Three 3 to 7 mm tubular adenomatous polyps in the sigmoid colon and in the descending colon, removed with a cold snare. Resected and retrieved. - The examination was otherwise normal on direct and retroflexion views.  Abdominal sonogram 11/16/2018: 1. No acute findings to account for the patient's symptoms. Specifically, no cholelithiasis or findings to suggest an acute cholecystitis. 2. Small simple cyst in the left lobe of the liver  incidentally noted. 3. Mild hepatic steatosis.   Past Medical History:  Diagnosis Date  . Arthritis    Back  . Blood transfusion    as a child  . Chronic back pain   . COPD (chronic obstructive pulmonary disease) ([place])   . Headache(784.0)    last one 6 months ago  . Hemorrhoids, internal   . HLD (hyperlipidemia)     . Hypertension   . Neck rigidity    post cervical fusion  . Nocturia   . Pneumonia   . Prostate disease     Past Surgical History:  Procedure Laterality Date  . CERVICAL FUSION  1992   C2/C 3  four surgeries  . COLONOSCOPY    . ESOPHAGOGASTRODUODENOSCOPY  07/20/2011   Procedure: ESOPHAGOGASTRODUODENOSCOPY (EGD);  Surgeon: Gatha Mayer, MD;  Location: [facility];  Service: Endoscopy;  Laterality: N/A;  . SAVORY DILATION  07/20/2011   Procedure: SAVORY DILATION;  Surgeon: Gatha Mayer, MD;  Location: [facility];  Service: Endoscopy;  Laterality: N/A;  . SPINAL FUSION  05/06/11    Prior to Admission medications   Medication Sig Start Date End Date Taking? Authorizing Provider  ADVAIR DISKUS 250-50 MCG/DOSE AEPB Inhale 1 puff into the lungs 2 (two) times daily as needed (sob/wheezing).  03/16/19  Yes [provider]  amitriptyline (ELAVIL) 50 MG tablet Take 50 mg by mouth in the morning, at noon, and at bedtime.    Yes [provider]  Aspirin-Salicylamide-Caffeine (BC HEADACHE POWDER PO) Take 1 packet by mouth as needed (headache/pain).   Yes [provider]  atorvastatin (LIPITOR) 40 MG tablet Take 1 tablet (40 mg total) by mouth daily. 12/07/18  Yes Martinique, Betty G, MD  baclofen (LIORESAL) 10 MG tablet Take 10 mg by mouth 2 (two) times daily.  07/11/19  Yes [provider]  oxycodone (ROXICODONE) 30 MG immediate release tablet Take 30 mg by mouth 3 (three) times daily.  08/21/15  Yes [provider]  polyethylene glycol (MIRALAX / GLYCOLAX) 17 g packet Take 34 g by mouth daily.    Yes [provider]  omeprazole (PRILOSEC) 40 MG capsule TAKE 1 CAPSULE BY MOUTH EVERY DAY BEFORE BREAKFAST Patient not taking: Reported on 07/17/2019 01/11/19   Martinique, Betty G, MD    Current Facility-Administered Medications  Medication Dose Route Frequency Provider Last Rate Last Admin  . 0.9 %  sodium chloride infusion  10 mL/hr Intravenous Once  Shela Leff, MD      . 0.9 %  sodium chloride infusion   Intravenous Continuous Shela Leff, MD 125 mL/hr at 07/18/19 0844 New Bag at 07/18/19 0844  . acetaminophen (TYLENOL) tablet 650 mg  650 mg Oral Q6H PRN Shela Leff, MD       Or  . acetaminophen (TYLENOL) suppository 650 mg  650 mg Rectal Q6H PRN Shela Leff, MD      . amitriptyline (ELAVIL) tablet 50 mg  50 mg Oral 3 times per day Shela Leff, MD   50 mg at 07/17/19 2248  . atorvastatin (LIPITOR) tablet 40 mg  40 mg Oral Daily Shela Leff, MD      . baclofen (LIORESAL) tablet 10 mg  10 mg Oral BID Shela Leff, MD   10 mg at 07/18/19 0833  . hydrALAZINE (APRESOLINE) injection 5 mg  5 mg Intravenous Q4H PRN Shela Leff, MD   5 mg at 07/18/19 0610  . ipratropium-albuterol (DUONEB) 0.5-2.5 (3) MG/3ML nebulizer solution 3 mL  3 mL Nebulization  Q6H PRN Shela Leff, MD      . oxyCODONE (Oxy IR/ROXICODONE) immediate release tablet 30 mg  30 mg Oral Q8H PRN Shela Leff, MD   30 mg at 07/18/19 0834  . pantoprazole (PROTONIX) EC tablet 40 mg  40 mg Oral Daily Shela Leff, MD   40 mg at 07/18/19 0836    Allergies as of 07/17/2019 - Review Complete 07/17/2019  Allergen Reaction Noted  . Nubain [nalbuphine hcl]  03/01/2011    Family History  Problem Relation Age of Onset  . Heart disease Mother   . Heart attack Mother 50  . Pancreatic cancer Father 23  . Pancreatic cancer Brother   . Anesthesia problems Neg Hx   . Colon cancer Neg Hx   . Liver cancer Neg Hx   . Stomach cancer Neg Hx   . Esophageal cancer Neg Hx     Social History   Socioeconomic History  . Marital status: Divorced    Spouse name: Not on file  . Number of children: 2  . Years of education: Not on file  . Highest education level: Not on file  Occupational History  . Occupation: Disabled  Tobacco Use  . Smoking status: Former Smoker    Years: 40.00    Types: Cigarettes    Quit date:  12/12/2018    Years since quitting: 0.5  . Smokeless tobacco: Never Used  Vaping Use  . Vaping Use: Never used  Substance and Sexual Activity  . Alcohol use: Yes    Alcohol/week: 2.0 standard drinks    Types: 2 Shots of liquor per week  . Drug use: No  . Sexual activity: Not on file  Other Topics Concern  . 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  Most of the form has been sold off.  He drinks about a liter of caffeinated drinks a day past smoker no drug use no alcohol.   Social Determinants of Health   Financial Resource Strain:   . Difficulty of Paying Living Expenses:   Food Insecurity:   . Worried About Charity fundraiser in the Last Year:   . Arboriculturist in the Last Year:   Transportation Needs:   . Film/video editor (Medical):   Marland Kitchen Lack of Transportation (Non-Medical):   Physical Activity:   . Days of Exercise per Week:   . Minutes of Exercise per Session:   Stress:   . Feeling of Stress :   Social Connections:   . Frequency of Communication with Friends and Family:   . Frequency of Social Gatherings with Friends and Family:   . Attends Religious Services:   . Active Member of Clubs or Organizations:   . Attends Archivist Meetings:   Marland Kitchen Marital Status:   Intimate Partner Violence:   . Fear of Current or Ex-Partner:   . Emotionally Abused:   Marland Kitchen Physically Abused:   . Sexually Abused:     Review of Systems:  See HPI, all other systems reviewed and are negative   Physical Exam: Vital signs in last 24 hours: Temp:  [98 F (36.7 C)-99.3 F (37.4 C)] 98.4 F (36.9 C) (06/17 0832) Pulse Rate:  [76-99] 92 (06/17 0832) Resp:  [11-31] 19 (06/17 0832) BP: (119-201)/(53-99) 175/68 (06/17 0832) SpO2:  [92 %-100 %] 99 % (06/17 0832) Weight:  [65.7 kg-67.1 kg] 65.7 kg (06/16 2322)  Last BM Date: 07/14/19 (not 100% sure) General:  Fatigued appearing male cooperative in  NAD. Head:  Normocephalic and atraumatic. Eyes:  No scleral icterus. Conjunctiva pink. Ears:  Normal auditory acuity. Nose:  No deformity, discharge or lesions. Mouth: Upper and lower dentures intact.  No ulcers or lesions.  Neck:  Supple. No lymphadenopathy or thyromegaly.  Lungs: Breath sounds clear throughout.    Heart:  RRR, no murmur. Abdomen:  Soft, nontender, nondistended. + BS x 4 quads. No HSM. Rectal: Deferred. Musculoskeletal:  Symmetrical without gross deformities.  Pulses:  Normal pulses noted. Extremities:  Without clubbing or edema. Neurologic:  Alert and  oriented x4. No focal deficits.  Skin:  Intact without significant lesions or rashes. Psych:  Alert and cooperative. Normal mood and affect.  Intake/Output from previous day: 06/16 0701 - 06/17 0700 In: 2340.3 [I.V.:650.3; Blood:690; IV Piggyback:1000] Out: 1400 [Urine:1400] Intake/Output this shift: No intake/output data recorded.  Lab Results: Recent Labs    07/17/19 1728 07/17/19 2233 07/18/19 0733  WBC 8.8  --  9.8  HGB 5.4* 4.9* 8.1*  HCT 17.9* 16.2* 24.6*  PLT 398  --  353   BMET Recent Labs    07/17/19 1728 07/18/19 0733  NA 137 137  K 3.7 3.4*  CL 102 105  CO2 22 22  GLUCOSE 123* 110*  BUN 25* 15  CREATININE 1.47* 0.84  CALCIUM 7.9* 8.0*   LFT Recent Labs    07/17/19 1728  PROT 6.0*  ALBUMIN 3.2*  AST 30  ALT 10  ALKPHOS 77  BILITOT 0.5   PT/INR No results for input(s): LABPROT, INR in the last 72 hours. Hepatitis Panel No results for input(s): HEPBSAG, HCVAB, HEPAIGM, HEPBIGM in the last 72 hours.    Studies/Results: DG Chest 2 View  Result Date: 07/17/2019 CLINICAL DATA:  Chest pain and short of breath EXAM: CHEST - 2 VIEW COMPARISON:  11/16/2018 FINDINGS: The heart size and mediastinal contours are within normal limits. Aortic atherosclerosis. Both lungs are clear. The visualized skeletal structures are unremarkable. IMPRESSION: No active cardiopulmonary disease.  Electronically Signed   By: Donavan Foil M.D.   On: 07/17/2019 17:31   CT Angio Chest PE W and/or Wo Contrast  Result Date: 07/17/2019 CLINICAL DATA:  Chest pain, short of breath, anemia EXAM: CT ANGIOGRAPHY CHEST WITH CONTRAST TECHNIQUE: Multidetector CT imaging of the chest was performed using the standard protocol during bolus administration of intravenous contrast. Multiplanar CT image reconstructions and MIPs were obtained to evaluate the vascular anatomy. CONTRAST:  142m OMNIPAQUE IOHEXOL 350 MG/ML SOLN COMPARISON:  11/16/2018 FINDINGS: Cardiovascular: This is a technically adequate evaluation of the pulmonary vasculature. No filling defects or pulmonary emboli. The heart is unremarkable without pericardial effusion. Stable thoracic aorta, with prominent atherosclerosis throughout the aortic arch. Mediastinum/Nodes: No enlarged mediastinal, hilar, or axillary lymph nodes. Thyroid gland, trachea, and esophagus demonstrate no significant findings. Lungs/Pleura: There is upper lobe predominant emphysema. No airspace disease, effusion, or pneumothorax. Central airways are patent. Upper Abdomen: Multiple left renal cysts are visualized. Please refer to corresponding CT abdomen report. Musculoskeletal: No acute or destructive bony lesions. Reconstructed images demonstrate no additional findings. Review of the MIP images confirms the above findings. IMPRESSION: 1. No evidence of pulmonary embolus. 2. Aortic Atherosclerosis (ICD10-I70.0) and Emphysema (ICD10-J43.9). Electronically Signed   By: MRanda NgoM.D.   On: 07/17/2019 20:17   CT ABDOMEN PELVIS W CONTRAST  Result Date: 07/17/2019 CLINICAL DATA:  Chest pain, short of breath, anemia EXAM: CT ABDOMEN  AND PELVIS WITH CONTRAST TECHNIQUE: Multidetector CT imaging of the abdomen and pelvis was performed using the standard protocol following bolus administration of intravenous contrast. CONTRAST:  151m OMNIPAQUE IOHEXOL 350 MG/ML SOLN COMPARISON:   11/16/2018 FINDINGS: Lower chest: Stable emphysema. Please refer to chest CT report. Hepatobiliary: Stable hepatic cysts. No other focal liver abnormalities. The gallbladder is normal. Pancreas: There is marked pancreatic parenchymal atrophy. Parenchymal calcifications consistent with sequela of chronic calcific pancreatitis. No acute inflammatory change. Spleen: Normal in size without focal abnormality. Adrenals/Urinary Tract: Bilateral renal cysts are again noted unchanged. No urinary tract calculi or obstructive uropathy. The bladder is moderately distended with no focal abnormalities or filling defects. The adrenals are normal. Stomach/Bowel: There is a large amount of retained stool throughout the colon. No bowel obstruction or ileus. No bowel wall thickening or inflammatory change. Vascular/Lymphatic: Aortic atherosclerosis. No enlarged abdominal or pelvic lymph nodes. Reproductive: Stable prostate calcifications. Other: No abdominal wall hernia or abnormality. No abdominopelvic ascites. Musculoskeletal: Postsurgical changes lower lumbar spine. No acute or destructive bony lesions. Progressive severe spondylosis at the L2/L3 level. Reconstructed images demonstrate no additional findings. IMPRESSION: 1. Large amount of retained stool throughout the colon. 2. Renal and hepatic cysts, unchanged. 3. Aortic Atherosclerosis (ICD10-I70.0). Electronically Signed   By: MRanda NgoM.D.   On: 07/17/2019 20:23    IMPRESSION/PLAN:  162 74year old male with profound IDA and weight loss. Admission hemoglobin 5.7. Transfused 2 units of PRBCs. Hemoglobin up to 8.1. Iron 30. TIBC 377. Ferritin 11. B12 436. FOBT negative. EGD/colonoscopy 03/08/2019 did not explain IDA/melena.  -Most likely will require small bowel capsule endoscopy or small bowel enteroscopy  further recommendations per Dr. CBryan Lemma -Monitor H/H closely -Monitor for active GI bleeding -Consider IV iron during hospital admission  -Repeat CBC in  am, will check celiac serology   2. RUQ abdominal pain, chronic.  -See plan in # 1 -Pain management per the hospitalist    3. History of GERD -Continue PPI po QD  4. History of colon polyps. Last colonoscopy 03/2019 three TA polyps removed. No further colonoscopies recommended due to age.  5. Chronic constipation  -Miralax Q HS PRN    CNoralyn Pick 07/18/2019, 11:42 AM    cirig

## 2019-07-19 ENCOUNTER — Encounter (HOSPITAL_COMMUNITY)
Admission: EM | Disposition: A | Discharge status: Home Health | Payer: Self-pay | Source: Home / Self Care | Attending: Internal Medicine

## 2019-07-19 ENCOUNTER — Other Ambulatory Visit: Payer: Self-pay

## 2019-07-19 ENCOUNTER — Telehealth: Payer: Self-pay | Admitting: Nurse Practitioner

## 2019-07-19 ENCOUNTER — Encounter (HOSPITAL_COMMUNITY): Payer: Self-pay | Admitting: Internal Medicine

## 2019-07-19 ENCOUNTER — Inpatient Hospital Stay (HOSPITAL_COMMUNITY): Payer: Medicare HMO | Admitting: Anesthesiology

## 2019-07-19 DIAGNOSIS — K259 Gastric ulcer, unspecified as acute or chronic, without hemorrhage or perforation: Secondary | ICD-10-CM | POA: Diagnosis present

## 2019-07-19 DIAGNOSIS — K315 Obstruction of duodenum: Secondary | ICD-10-CM

## 2019-07-19 DIAGNOSIS — K269 Duodenal ulcer, unspecified as acute or chronic, without hemorrhage or perforation: Secondary | ICD-10-CM

## 2019-07-19 DIAGNOSIS — Z8719 Personal history of other diseases of the digestive system: Secondary | ICD-10-CM | POA: Diagnosis present

## 2019-07-19 DIAGNOSIS — K2100 Gastro-esophageal reflux disease with esophagitis, without bleeding: Secondary | ICD-10-CM

## 2019-07-19 DIAGNOSIS — K921 Melena: Secondary | ICD-10-CM

## 2019-07-19 DIAGNOSIS — K2970 Gastritis, unspecified, without bleeding: Secondary | ICD-10-CM

## 2019-07-19 HISTORY — PX: BIOPSY: SHX5522

## 2019-07-19 HISTORY — PX: ESOPHAGOGASTRODUODENOSCOPY (EGD) WITH PROPOFOL: SHX5813

## 2019-07-19 LAB — CBC
HCT: 22.9 % — ABNORMAL LOW (ref 39.0–52.0)
HCT: 24.4 % — ABNORMAL LOW (ref 39.0–52.0)
Hemoglobin: 7.2 g/dL — ABNORMAL LOW (ref 13.0–17.0)
Hemoglobin: 7.8 g/dL — ABNORMAL LOW (ref 13.0–17.0)
MCH: 29.4 pg (ref 26.0–34.0)
MCH: 29.7 pg (ref 26.0–34.0)
MCHC: 31.4 g/dL (ref 30.0–36.0)
MCHC: 32 g/dL (ref 30.0–36.0)
MCV: 93.5 fL (ref 80.0–100.0)
MCV: 92.8 fL (ref 80.0–100.0)
Platelets: 333 10*3/uL (ref 150–400)
Platelets: 367 10*3/uL (ref 150–400)
RBC: 2.45 MIL/uL — ABNORMAL LOW (ref 4.22–5.81)
RBC: 2.63 MIL/uL — ABNORMAL LOW (ref 4.22–5.81)
RDW: 14.6 % (ref 11.5–15.5)
RDW: 14.5 % (ref 11.5–15.5)
WBC: 5.4 10*3/uL (ref 4.0–10.5)
WBC: 7.5 10*3/uL (ref 4.0–10.5)
nRBC: 0 % (ref 0.0–0.2)
nRBC: 0 % (ref 0.0–0.2)

## 2019-07-19 LAB — COMPREHENSIVE METABOLIC PANEL
ALT: 11 U/L (ref 0–44)
AST: 32 U/L (ref 15–41)
Albumin: 2.7 g/dL — ABNORMAL LOW (ref 3.5–5.0)
Alkaline Phosphatase: 74 U/L (ref 38–126)
Anion gap: 4 — ABNORMAL LOW (ref 5–15)
BUN: 11 mg/dL (ref 8–23)
CO2: 23 mmol/L (ref 22–32)
Calcium: 7.7 mg/dL — ABNORMAL LOW (ref 8.9–10.3)
Chloride: 111 mmol/L (ref 98–111)
Creatinine, Ser: 0.86 mg/dL (ref 0.61–1.24)
GFR calc Af Amer: >60 mL/min (ref >60)
GFR calc non Af Amer: >60 mL/min (ref >60)
Glucose, Bld: 133 mg/dL — ABNORMAL HIGH (ref 70–99)
Potassium: 3.6 mmol/L (ref 3.5–5.1)
Sodium: 138 mmol/L (ref 135–145)
Total Bilirubin: 0.9 mg/dL (ref 0.3–1.2)
Total Protein: 5.3 g/dL — ABNORMAL LOW (ref 6.5–8.1)

## 2019-07-19 LAB — MAGNESIUM: Magnesium: 2.1 mg/dL (ref 1.7–2.4)

## 2019-07-19 LAB — APTT: aPTT: 28 seconds (ref 24–36)

## 2019-07-19 LAB — PROTIME-INR
INR: 1.1 (ref 0.8–1.2)
Prothrombin Time: 13.5 seconds (ref 11.4–15.2)

## 2019-07-19 SURGERY — BIOPSY
Anesthesia: Monitor Anesthesia Care

## 2019-07-19 MED ORDER — PROPOFOL 500 MG/50ML IV EMUL
INTRAVENOUS | Status: DC | PRN
  Administered 2019-07-19: 120 ug/kg/min via INTRAVENOUS

## 2019-07-19 MED ORDER — HYDRALAZINE HCL 20 MG/ML IJ SOLN
INTRAMUSCULAR | Status: AC
  Filled 2019-07-19: qty 1

## 2019-07-19 MED ORDER — KETAMINE HCL 10 MG/ML IJ SOLN
INTRAMUSCULAR | Status: AC
  Filled 2019-07-19: qty 1

## 2019-07-19 MED ORDER — LACTATED RINGERS IV SOLN
INTRAVENOUS | Status: AC | PRN
  Administered 2019-07-19: 1000 mL via INTRAVENOUS

## 2019-07-19 MED ORDER — KETAMINE HCL 10 MG/ML IJ SOLN
INTRAMUSCULAR | Status: DC | PRN
Start: 2019-07-19 — End: 2019-07-19
  Administered 2019-07-19: 20 mg via INTRAVENOUS

## 2019-07-19 MED ORDER — PROPOFOL 10 MG/ML IV BOLUS
INTRAVENOUS | Status: DC | PRN
  Administered 2019-07-19: 20 mg via INTRAVENOUS
  Administered 2019-07-19 (×3): 40 mg via INTRAVENOUS

## 2019-07-19 MED ORDER — PANTOPRAZOLE SODIUM 40 MG PO TBEC
40.0000 mg | DELAYED_RELEASE_TABLET | Freq: Two times a day (BID) | ORAL | Status: DC
  Administered 2019-07-19 – 2019-07-21 (×4): 40 mg via ORAL
  Filled 2019-07-19 (×5): qty 1

## 2019-07-19 MED ORDER — SUCRALFATE 1 GM/10ML PO SUSP
1.0000 g | Freq: Three times a day (TID) | ORAL | Status: DC
  Administered 2019-07-19 – 2019-07-21 (×9): 1 g via ORAL
  Filled 2019-07-19 (×10): qty 10

## 2019-07-19 MED ORDER — AMLODIPINE BESYLATE 10 MG PO TABS
10.0000 mg | ORAL_TABLET | Freq: Every day | ORAL | Status: DC
  Administered 2019-07-19 – 2019-07-21 (×3): 10 mg via ORAL
  Filled 2019-07-19 (×4): qty 1

## 2019-07-19 MED ORDER — PROPOFOL 500 MG/50ML IV EMUL
INTRAVENOUS | Status: AC
  Filled 2019-07-19: qty 50

## 2019-07-19 MED ORDER — HYDRALAZINE HCL 20 MG/ML IJ SOLN
10.0000 mg | Freq: Once | INTRAMUSCULAR | Status: AC
  Administered 2019-07-19: 10 mg via INTRAVENOUS

## 2019-07-19 SURGICAL SUPPLY — 1 items: TOWEL COTTON PACK 4EA (MISCELLANEOUS) ×6 IMPLANT

## 2019-07-19 NOTE — Progress Notes (Signed)
Off unit via wheelchair with one staff member. A&O x4 no acute distress noted

## 2019-07-19 NOTE — Telephone Encounter (Signed)
CBC with diff order placed.  Appointment scheduled for 2 week follow up with you.

## 2019-07-19 NOTE — TOC Progression Note (Signed)
Transition of Care [facility]) - Progression Note    Patient Details  Name: Michael Bender MRN: [id number] Date of Birth: 1945/04/28  Transition of Care [facility]) CM/SW Contact  Purcell Mouton, RN Phone Number: 07/19/2019, 3:56 PM  Clinical Narrative:    Pt from home alone. TOC will follow for discharge needs.    Expected Discharge Plan: [place] Barriers to Discharge: No Barriers Identified  Expected Discharge Plan and Services Expected Discharge Plan: [place] arrangements for the past 2 months: Single Family Home                                       Social Determinants of Health (SDOH) Interventions    Readmission Risk Interventions No flowsheet data found.

## 2019-07-19 NOTE — Op Note (Addendum)
[facility] Patient Name: Michael Bender Procedure Date: 07/19/2019 MRN: [id number] Attending MD: Gerrit Heck , MD Date of Birth: 12/09/1945 CSN: [id number] Age: 74 Admit Type: Inpatient Procedure:                Upper GI endoscopy Indications:              Abdominal pain in the right upper quadrant, Iron                            deficiency anemia, Esophageal reflux, Weight loss Providers:                Gerrit Heck, MD, Cleda Daub, RN, Elspeth Cho Tech., Technician, Anne Fu                            CRNA, CRNA Referring MD:              Medicines:                Monitored Anesthesia Care Complications:            No immediate complications. Estimated Blood Loss:     Estimated blood loss was minimal. Procedure:                Pre-Anesthesia Assessment:                           - Prior to the procedure, a History and Physical                            was performed, and patient medications and                            allergies were reviewed. The patient's tolerance of                            previous anesthesia was also reviewed. The risks                            and benefits of the procedure and the sedation                            options and risks were discussed with the patient.                            All questions were answered, and informed consent                            was obtained. Prior Anticoagulants: The patient has                            taken no previous anticoagulant or antiplatelet  agents. ASA Grade Assessment: III - A patient with                            severe systemic disease. After reviewing the risks                            and benefits, the patient was deemed in                            satisfactory condition to undergo the procedure.                           After obtaining informed consent, the endoscope was                             passed under direct vision. Throughout the                            procedure, the patient's blood pressure, pulse, and                            oxygen saturations were monitored continuously. The                            [id number]-[id number] ([id number]) Olympus pediatric colonscope                            was introduced through the mouth and advanced to                            the duodenal bulb. After obtaining informed                            consent, the endoscope was passed under direct                            vision. Throughout the procedure, the patient's                            blood pressure, pulse, and oxygen saturations were                            monitored continuously.The upper GI endoscopy was                            accomplished without difficulty. The patient                            tolerated the procedure well. Scope In: Scope Out: Findings:      LA Grade B (one or more mucosal breaks greater than 5 mm, not extending       between the tops of two mucosal folds) esophagitis with no bleeding was       found in the lower third of the esophagus.  The upper third of the esophagus and middle third of the esophagus were       normal.      Localized mild inflammation characterized by erosions and erythema was       found in the gastric body and in the gastric antrum. Biopsies were taken       with a cold forceps for histology. Estimated blood loss was minimal.      The gastric fundus and proximal gastric body were normal.      One shallow ulcer in the pyloric channel, and two non-bleeding cratered       duodenal ulcers were found in the duodenal bulb and in the first portion       of the duodenum. The largest lesion was in the 1st portion of the       duodenum, measuring 15 mm in largest dimension. This caused significant       luminal distortion and stenosis, as outlined below. There was moderately       severe inflammation, characterized by edema and  erythema in the       visualized duodenum. Biopsies were taken with a cold forceps for       histology. Estimated blood loss was minimal.      An acquired severe stenosis was found in the first portion of the       duodenum, located immediately adjacent to the larger duodenal ulcer.       This was non-traversable. Impression:               - LA Grade B reflux esophagitis with no bleeding.                           - Normal upper third of esophagus and middle third                            of esophagus.                           - Gastritis. Biopsied.                           - Normal gastric fundus and gastric body.                           - Non-bleeding duodenal ulcers with no stigmata of                            bleeding. Biopsied.                           - Acquired duodenal stenosis. Moderate Sedation:      Not Applicable - Patient had care per Anesthesia. Recommendation:           - Return patient to hospital ward for ongoing care.                           - Full liquid diet today, then slowly advance to                            soft foods. Would not advance  beyon soft diet until                            after repeat EGD due to duodenal stenosis.                           - Use Protonix (pantoprazole) 40 mg PO BID for 8                            weeks, then reduuce to 40 mg daily.                           - No aspirin, ibuprofen, naproxen, or other                            non-steroidal anti-inflammatory drugs.                           - Await pathology results.                           - Repeat upper endoscopy in 8 weeks to check                            healing.                           - If any concern for acute rebleeding, recommend IR                            consultation for consideration of coil embolization.                           - Continue to trend serial H/H, with additional                            blood products as needed per protocol.                            - Consider dose of IV Feraheme prior to hospital                            discharge.                           - Will need repeat CBC 7-10 days after hospital                            discharge along with follow-up in the GI clinic 2-3                            weeks after discharge.                           - Results discussed with patient and his son by  phone.                           - Use sucralfate suspension 1 gram PO QID for 4                            weeks. Procedure Code(s):        --- Professional ---                           [phone], Esophagogastroduodenoscopy, flexible,                            transoral; with biopsy, single or multiple Diagnosis Code(s):        --- Professional ---                           K21.00, Gastro-esophageal reflux disease with                            esophagitis, without bleeding                           K29.70, Gastritis, unspecified, without bleeding                           K26.9, Duodenal ulcer, unspecified as acute or                            chronic, without hemorrhage or perforation                           K31.5, Obstruction of duodenum                           R10.11, Right upper quadrant pain                           D50.9, Iron deficiency anemia, unspecified                           R63.4, Abnormal weight loss CPT copyright 2019 American Medical Association. All rights reserved. The codes documented in this report are preliminary and upon coder review may  be revised to meet current compliance requirements. Gerrit Heck, MD 07/19/2019 11:00:15 AM Number of Addenda: 0

## 2019-07-19 NOTE — Anesthesia Postprocedure Evaluation (Signed)
Anesthesia Post Note  Patient: Michael Bender  Procedure(s) Performed: ENTEROSCOPY (N/A ) BIOPSY     Patient location during evaluation: PACU Anesthesia Type: MAC Level of consciousness: awake and alert Pain management: pain level controlled Vital Signs Assessment: post-procedure vital signs reviewed and stable Respiratory status: spontaneous breathing, nonlabored ventilation, respiratory function stable and patient connected to nasal cannula oxygen Cardiovascular status: stable and blood pressure returned to baseline Postop Assessment: no apparent nausea or vomiting Anesthetic complications: no   No complications documented.  Last Vitals:  Vitals:   07/19/19 1100 07/19/19 1139  BP: (!) 144/86 (!) 162/80  Pulse: 78 79  Resp: 19   Temp:    SpO2: 100% 100%    Last Pain:  Vitals:   07/19/19 1139  TempSrc:   PainSc: 0-No pain                 Kodee Drury

## 2019-07-19 NOTE — Progress Notes (Signed)
Arrived to unit via wheelchair with one staff member. A&O x4 no s/s of distress or complaints voiced.

## 2019-07-19 NOTE — Telephone Encounter (Signed)
Michael Bender, Mr. Conery will be discharged from [facility], he most likely will be discharged home tomorrow. Can you  Provide him with an order to have a CBC done one week from his discharge date and schedule a follow up appt in office with me or Dr. Carlean Purl in 2 to 3 weeks. He will need a repeat EGD with Dr. Carlean Purl in 8 weeks which can be schedule at the time of his follow up appt. Thx

## 2019-07-19 NOTE — Anesthesia Preprocedure Evaluation (Addendum)
Anesthesia Evaluation  Patient identified by MRN, date of birth, ID band Patient awake    Reviewed: Allergy & Precautions, H&P , NPO status , Patient's Chart, lab work & pertinent test results  Airway Mallampati: I  TM Distance: >3 FB Neck ROM: Full    Dental no notable dental hx. (+) Edentulous Upper, Edentulous Lower, Upper Dentures, Lower Dentures   Pulmonary asthma , pneumonia, COPD, former smoker,    Pulmonary exam normal breath sounds clear to auscultation       Cardiovascular hypertension, Pt. on medications + Peripheral Vascular Disease  Normal cardiovascular exam Rhythm:Regular Rate:Normal     Neuro/Psych  Headaches, negative psych ROS   GI/Hepatic Neg liver ROS, GERD  Medicated,  Endo/Other    Renal/GU Renal disease     Musculoskeletal   Abdominal   Peds  Hematology  (+) Blood dyscrasia, anemia ,   Anesthesia Other Findings   Reproductive/Obstetrics                            Anesthesia Physical  Anesthesia Plan  ASA: III  Anesthesia Plan: MAC   Post-op Pain Management:    Induction: Intravenous  PONV Risk Score and Plan: 1  Airway Management Planned: Nasal Cannula, Simple Face Mask and Mask  Additional Equipment:   Intra-op Plan:   Post-operative Plan:   Informed Consent: I have reviewed the patients History and Physical, chart, labs and discussed the procedure including the risks, benefits and alternatives for the proposed anesthesia with the patient or authorized representative who has indicated his/her understanding and acceptance.       Plan Discussed with: Anesthesiologist and CRNA  Anesthesia Plan Comments:         Anesthesia Quick Evaluation

## 2019-07-19 NOTE — Transfer of Care (Signed)
Immediate Anesthesia Transfer of Care Note  Patient: Michael Bender  Procedure(s) Performed: Procedure(s) with comments: ENTEROSCOPY (N/A) - Enteroscopy to be completed first.  Possible placement of VCE/small bowel capsule  Patient Location: PACU  Anesthesia Type:MAC  Level of Consciousness:  sedated, patient cooperative and responds to stimulation  Airway & Oxygen Therapy:Patient Spontanous Breathing and Patient connected to face mask oxgen  Post-op Assessment:  Report given to PACU RN and Post -op Vital signs reviewed and stable  Post vital signs:  Reviewed and stable  Last Vitals:  Vitals:   07/19/19 0955 07/19/19 1051  BP: (!) 193/69 (!) 136/100  Pulse:  (!) 28  Resp:  17  Temp:    SpO2:  49%    Complications: No apparent anesthesia complications

## 2019-07-19 NOTE — Progress Notes (Signed)
Michael Bender  NGE:[id number] DOB: [birth year] DOA: 07/17/2019 PCP: Martinique, Betty G, MD    Brief Narrative:  [phone] w/ a hx of COPD, HTN, HLD, chronic back pain, gastritis, and colon polyps who presented with exertional dyspnea.  Patient noted transient development of melena in January but since then no hematemesis or hematochezia. He reports intermittent right upper quadrant abdominal pain.  ROS also revealed an unintentional 14 pound weight loss over the past 4 months.  In the ED the patient was found to have a Hgb of 5.4 and BUN of 25.  CT abdomen noted a large stool burden but otherwise no acute findings.  Significant Events: 6/16 admit via [facility] ED -transfused 2 units PRBC 6/18 EGD -large duodenal ulcers with duodenal stenosis  Antimicrobials:  None    Subjective: Ongoing abdom discomfort. No cp, nausea/vomiting. Poor appetite persists. Counseled at length on results of EGD and tx plan.   Assessment & Plan:  Large duodenal ulcers with duodenal stenosis Diagnosed via EGD 6/18 -Protonix 40 mg twice daily x8 weeks then daily -Carafate -advance to soft diet only -to follow-up with GI as outpatient - Colonoscopy February 2021 (Dr. Carlean Purl) revealed 3 sessile polyps (tubular adenoma) and EGD at that time noted only gastritis   Anemia of acute v/s chronic blood loss  S/p 2U PRBC thus far w/ good response - recheck hemoglobin this evening and again in a.m. as patient could still be losing blood  Recent Labs  Lab 07/17/19 1728 07/17/19 2233 07/18/19 0733 07/18/19 1343 07/19/19 0440  HGB 5.4* 4.9* 8.1* 7.1* 7.2*    Severe Fe deficiency Due to GI losses - dosed w/ IV Fe 6/17  HTN urgency Blood pressure not at goal -adjust treatment and follow  Mild hypokalemia Due to poor oral intake -supplemented into normal range  Acute kidney injury  Resolved w/ volume expansion/transfusion  Chorea  Undergoing outpt w/u per Neurology (Dr. Carles Collet)  HLD Continue usual medical  treatment  COPD Well compensated at present  Chronic back pain  Controlled at this time  DVT prophylaxis: SCDs Code Status: NO CODE BLUE  Family Communication: spoke w/ son at bedside   The patient will require care spanning > 2 midnights and should be moved to inpatient because: Inpatient level of care appropriate due to severity of illness  Dispo: The patient is from: Home              Anticipated d/c is to: Home              Anticipated d/c date is: 2 days              Patient currently is not medically stable to d/c.   Consultants:  [facility]  Objective: Blood pressure (!) 162/80, pulse 79, temperature 98.1 F (36.7 C), temperature source Oral, resp. rate 19, height 5\' 11"  (1.803 m), weight 65.7 kg, SpO2 100 %.  Intake/Output Summary (Last 24 hours) at 07/19/2019 1537 Last data filed at 07/19/2019 1358 Gross per 24 hour  Intake 1607.97 ml  Output 1477 ml  Net 130.97 ml   Filed Weights   07/17/19 2322 07/19/19 0925  Weight: 65.7 kg 65.7 kg    Examination: General: No acute respiratory distress Lungs: Clear to auscultation bilaterally without wheezing Cardiovascular: RRR without murmur or rub Abdomen: Mildly tender in right upper quadrant without change, bowel sounds positive, nondistended, no rebound Extremities: No C/C/E bilateral lower extremities  CBC: Recent Labs  Lab 07/17/19 1728 07/17/19 2233 07/18/19 2725  07/18/19 1343 07/19/19 0440  WBC 8.8  --  9.8 6.9 5.4  NEUTROABS 6.9  --  7.4  --   --   HGB 5.4*   < > 8.1* 7.1* 7.2*  HCT 17.9*   < > 24.6* 22.1* 22.9*  MCV 99.4  --  91.8 92.1 93.5  PLT 398  --  353 333 333   < > = values in this interval not displayed.   Basic Metabolic Panel: Recent Labs  Lab 07/17/19 1728 07/18/19 0733 07/19/19 0440  NA 137 137 138  K 3.7 3.4* 3.6  CL 102 105 111  CO2 22 22 23   GLUCOSE 123* 110* 133*  BUN 25* 15 11  CREATININE 1.47* 0.84 0.86  CALCIUM 7.9* 8.0* 7.7*  MG  --   --  2.1   GFR: Estimated  Creatinine Clearance: 70 mL/min (by C-G formula based on SCr of 0.86 mg/dL).  Liver Function Tests: Recent Labs  Lab 07/17/19 1728 07/19/19 0440  AST 30 32  ALT 10 11  ALKPHOS 77 74  BILITOT 0.5 0.9  PROT 6.0* 5.3*  ALBUMIN 3.2* 2.7*   Recent Labs  Lab 07/17/19 1728  LIPASE 23    Cardiac Enzymes: Recent Labs  Lab 07/17/19 1728  CKTOTAL 112    HbA1C: Hemoglobin A1C  Date/Time Value Ref Range Status  11/20/2018 09:43 AM 5.1 4.0 - 5.6 % Final     Recent Results (from the past 240 hour(s))  SARS Coronavirus 2 by RT PCR (hospital order, performed in [place] hospital lab) Nasopharyngeal Nasopharyngeal Swab     Status: None   Collection Time: 07/17/19  5:55 PM   Specimen: Nasopharyngeal Swab  Result Value Ref Range Status   SARS Coronavirus 2 NEGATIVE NEGATIVE Final    Comment: (NOTE) SARS-CoV-2 target nucleic acids are NOT DETECTED.  The SARS-CoV-2 RNA is generally detectable in upper and lower respiratory specimens during the acute phase of infection. The lowest concentration of SARS-CoV-2 viral copies this assay can detect is 250 copies / mL. A negative result does not preclude SARS-CoV-2 infection and should not be used as the sole basis for treatment or other patient management decisions.  A negative result may occur with improper specimen collection / handling, submission of specimen other than nasopharyngeal swab, presence of viral mutation(s) within the areas targeted by this assay, and inadequate number of viral copies (<250 copies / mL). A negative result must be combined with clinical observations, patient history, and epidemiological information.  Fact Sheet for Patients:   StrictlyIdeas.no  Fact Sheet for Healthcare Providers: BankingDealers.co.za  This test is not yet approved or  cleared by the [place] FDA and has been authorized for detection and/or diagnosis of SARS-CoV-2 by FDA under an  Emergency Use Authorization (EUA).  This EUA will remain in effect (meaning this test can be used) for the duration of the COVID-19 declaration under Section 564(b)(1) of the Act, 21 U.S.C. section 360bbb-3(b)(1), unless the authorization is terminated or revoked sooner.  Performed at [facility], [place] [address], [place], [place] [address]      Scheduled Meds: . amitriptyline  50 mg Oral 3 times per day  . amLODipine  10 mg Oral Daily  . atorvastatin  40 mg Oral Daily  . baclofen  10 mg Oral BID  . cloNIDine  0.1 mg Oral TID  . pantoprazole  40 mg Oral BID  . polyethylene glycol  34 g Oral Daily  . sucralfate  1 g  Oral TID WC & HS      LOS: 1 day   Cherene Altes, MD [facility] Office  [phone] Pager - Text Page per Amion  If 7PM-7AM, please contact night-coverage per Amion 07/19/2019, 3:37 PM

## 2019-07-19 NOTE — Interval H&P Note (Signed)
History and Physical Interval Note:  07/19/2019 10:09 AM  Michael Bender  has presented today for surgery, with the diagnosis of IDA, GI bleed.  The various methods of treatment have been discussed with the patient and family. After consideration of risks, benefits and other options for treatment, the patient has consented to  Procedure(s) with comments: ENTEROSCOPY (N/A) - Enteroscopy to be completed first.  Possible placement of VCE/small bowel capsule GIVENS CAPSULE STUDY (N/A) as a surgical intervention.  The patient's history has been reviewed, patient examined, no change in status, stable for surgery.  I have reviewed the patient's chart and labs.  Questions were answered to the patient's satisfaction.     [name] [name]

## 2019-07-19 NOTE — Telephone Encounter (Signed)
Appointment 08/01/19 at 1:30pm with Carl Best

## 2019-07-20 LAB — BASIC METABOLIC PANEL
Anion gap: 6 (ref 5–15)
BUN: 7 mg/dL — ABNORMAL LOW (ref 8–23)
CO2: 26 mmol/L (ref 22–32)
Calcium: 8.2 mg/dL — ABNORMAL LOW (ref 8.9–10.3)
Chloride: 105 mmol/L (ref 98–111)
Creatinine, Ser: 0.73 mg/dL (ref 0.61–1.24)
GFR calc Af Amer: >60 mL/min (ref >60)
GFR calc non Af Amer: >60 mL/min (ref >60)
Glucose, Bld: 88 mg/dL (ref 70–99)
Potassium: 3.8 mmol/L (ref 3.5–5.1)
Sodium: 137 mmol/L (ref 135–145)

## 2019-07-20 LAB — CBC
HCT: 22.7 % — ABNORMAL LOW (ref 39.0–52.0)
Hemoglobin: 7.2 g/dL — ABNORMAL LOW (ref 13.0–17.0)
MCH: 29.4 pg (ref 26.0–34.0)
MCHC: 31.7 g/dL (ref 30.0–36.0)
MCV: 92.7 fL (ref 80.0–100.0)
Platelets: 347 10*3/uL (ref 150–400)
RBC: 2.45 MIL/uL — ABNORMAL LOW (ref 4.22–5.81)
RDW: 14.6 % (ref 11.5–15.5)
WBC: 7.3 10*3/uL (ref 4.0–10.5)
nRBC: 0 % (ref 0.0–0.2)

## 2019-07-20 LAB — PREPARE RBC (CROSSMATCH)

## 2019-07-20 MED ORDER — SODIUM CHLORIDE 0.9% IV SOLUTION: Freq: Once | INTRAVENOUS | Status: AC

## 2019-07-20 NOTE — Evaluation (Signed)
Physical Therapy Evaluation Patient Details Name: Michael Bender MRN: [id number] DOB: 1945/02/11 Today's Date: 07/20/2019   History of Present Illness  Michael Bender is a 74 y.o. male with medical history significant of COPD, hypertension, hyperlipidemia, chronic back pain, gastritis, colon polyps presenting with complaints of severe exertional dyspnea and fatigue for the past few days.In the ED the patient was found to have a Hgb of 5.4 and BUN of 25.  CT abdomen noted a large stool burden but otherwise no acute findings  Clinical Impression  The patient presents with  Tremors at baseline. Patient  Did ambulate x 47' in room with RW. Patient reports having a standard walker. Patient reports family and friends available to assist. Patient is  interested in recommendations for food choices as he has friends get groceries.  BP 148/77    Follow Up Recommendations Home health PT;Supervision - Intermittent    Equipment Recommendations  None recommended by PT    Recommendations for Other Services       Precautions / Restrictions Precautions Precautions: Fall Precaution Comments: HGB low      Mobility  Bed Mobility Overal bed mobility: Needs Assistance Bed Mobility: Supine to Sit     Supine to sit: Supervision        Transfers Overall transfer level: Needs assistance Equipment used: Rolling walker (2 wheeled) Transfers: Sit to/from Stand Sit to Stand: Min guard            Ambulation/Gait Ambulation/Gait assistance: Min guard;Min assist Gait Distance (Feet): 80 Feet Assistive device: Rolling walker (2 wheeled) Gait Pattern/deviations: Step-through pattern;Decreased stride length Gait velocity: decr   General Gait Details: gait steady with RW, moves slowly  Stairs            Wheelchair Mobility    Modified Rankin (Stroke Patients Only)       Balance Overall balance assessment: Mild deficits observed, not formally tested                                            Pertinent Vitals/Pain Pain Assessment: No/denies pain    Home Living Family/patient expects to be discharged to:: Private residence Living Arrangements: Alone Available Help at Discharge: Family;Available PRN/intermittently;Friend(s) Type of Home: House Home Access: Stairs to enter   [employer] of Steps: 1 Home Layout: One level Home Equipment: Walker - standard;Cane - single point      Prior Function Level of Independence: Independent with assistive device(s)         Comments: walk the dog-Buddy- but has not been able     Hand Dominance        Extremity/Trunk Assessment   Upper Extremity Assessment Upper Extremity Assessment: Generalized weakness (tremors noted)    Lower Extremity Assessment Lower Extremity Assessment: Generalized weakness    Cervical / Trunk Assessment Cervical / Trunk Assessment: Normal  Communication   Communication: No difficulties  Cognition Arousal/Alertness: Awake/alert Behavior During Therapy: WFL for tasks assessed/performed Overall Cognitive Status: Within Functional Limits for tasks assessed                                        General Comments      Exercises     Assessment/Plan    PT Assessment Patient needs continued PT services  PT Problem  List Decreased strength;Decreased knowledge of precautions;Decreased mobility;Decreased activity tolerance;Decreased safety awareness       PT Treatment Interventions DME instruction;Therapeutic activities;Gait training;Therapeutic exercise;Patient/family education;Functional mobility training    PT Goals (Current goals can be found in the Care Plan section)  Acute Rehab PT Goals Patient Stated Goal: to go home PT Goal Formulation: With patient Time For Goal Achievement: 08/03/19 Potential to Achieve Goals: Good    Frequency Min 3X/week   Barriers to discharge        Co-evaluation                AM-PAC PT "6 Clicks" Mobility  Outcome Measure Help needed turning from your back to your side while in a flat bed without using bedrails?: None Help needed moving from lying on your back to sitting on the side of a flat bed without using bedrails?: None Help needed moving to and from a bed to a chair (including a wheelchair)?: A Little Help needed standing up from a chair using your arms (e.g., wheelchair or bedside chair)?: A Little Help needed to walk in hospital room?: A Little Help needed climbing 3-5 steps with a railing? : A Lot 6 Click Score: 19    End of Session Equipment Utilized During Treatment: Gait belt Activity Tolerance: Patient tolerated treatment well Patient left: in chair;with call bell/phone within reach Nurse Communication: Mobility status PT Visit Diagnosis: Unsteadiness on feet (R26.81);Difficulty in walking, not elsewhere classified (R26.2)    Time: 7793-9030 PT Time Calculation (min) (ACUTE ONLY): 14 min   Charges:   PT Evaluation $PT Eval Low Complexity: [place] PT Acute Rehabilitation Services Pager [phone] Office [phone]   Claretha Cooper 07/20/2019, 2:27 PM

## 2019-07-20 NOTE — Plan of Care (Signed)

## 2019-07-20 NOTE — Progress Notes (Signed)
Michael Bender  OZH:[id number] DOB: 1945/09/27 DOA: 07/17/2019 PCP: Martinique, Betty G, MD    Brief Narrative:  [phone] w/ a hx of COPD, HTN, HLD, chronic back pain, gastritis, and colon polyps who presented with exertional dyspnea.  Patient noted transient development of melena in January but since then no hematemesis or hematochezia. He reports intermittent right upper quadrant abdominal pain.  ROS also revealed an unintentional 14 pound weight loss over the past 4 months.  In the ED the patient was found to have a Hgb of 5.4 and BUN of 25.  CT abdomen noted a large stool burden but otherwise no acute findings.  Significant Events: 6/16 admit via [facility] ED -transfused 2 units PRBC 6/18 EGD -large duodenal ulcers with duodenal stenosis  Antimicrobials:  None    Subjective: Patient reports significant improvement in his abdominal discomfort today.  He reports slowly improved appetite.  He denies chest pain nausea or vomiting.  He did not sleep well last night.  Assessment & Plan:  Large duodenal ulcers with duodenal stenosis Diagnosed via EGD 6/18 - Protonix 40 mg twice daily x8 weeks then daily -Carafate -advance to soft diet only -to follow-up with GI as outpatient - Colonoscopy February 2021 (Dr. Carlean Purl) revealed 3 sessile polyps (tubular adenoma) and EGD at that time noted only gastritis   Anemia of acute v/s chronic blood loss  S/p 2U PRBC thus far w/ good response -given severity of known duodenal ulcers and fact that the patient's hemoglobin is hovering near our transfusion threshold I have advised the patient that I wish to transfuse 1 additional unit of PRBC prior to discharging him home -he understands and agrees -we will transfuse 1 unit today and recheck hemoglobin in a.m. -if hemoglobin appropriately improved and patient is otherwise stable patient will likely be discharged 6/20  Recent Labs  Lab 07/18/19 0733 07/18/19 1343 07/19/19 0440 07/19/19 1656 07/20/19 0511  HGB 8.1*  7.1* 7.2* 7.8* 7.2*    Severe Fe deficiency Due to GI losses - dosed w/ IV Fe 6/17  HTN urgency Blood pressure control improved -further titrate medicines and follow  Mild hypokalemia Due to poor oral intake -supplemented into normal range  Acute kidney injury  Resolved w/ volume expansion/transfusion  Chorea  Undergoing outpt w/u per Neurology (Dr. Carles Collet)  HLD Continue usual medical treatment  COPD Well compensated at present  Chronic back pain  Controlled at this time  DVT prophylaxis: SCDs Code Status: NO CODE BLUE  Family Communication: Spoke with son via phone during my visit with the patient  The patient will require care spanning > 2 midnights and should be moved to inpatient because: Inpatient level of care appropriate due to severity of illness  Dispo: The patient is from: Home              Anticipated d/c is to: Home              Anticipated d/c date is: 6/20              Patient currently is not medically stable to d/c.   Consultants:  [facility]  Objective: Blood pressure (!) 151/74, pulse 89, temperature 98.4 F (36.9 C), temperature source Oral, resp. rate 18, height 5\' 11"  (1.803 m), weight 65.7 kg, SpO2 99 %.  Intake/Output Summary (Last 24 hours) at 07/20/2019 1538 Last data filed at 07/20/2019 0600 Gross per 24 hour  Intake 350 ml  Output --  Net 350 ml   [employer]  07/17/19 2322 07/19/19 0925  Weight: 65.7 kg 65.7 kg    Examination: General: No acute respiratory distress Lungs: Clear to station bilaterally without wheezing Cardiovascular: RRR without murmur or rub Abdomen: Mildly tender to deep palpation right upper quadrant with no rebound and no appreciable mass, bowel sounds positive Extremities: No edema bilateral lower extremities  CBC: Recent Labs  Lab 07/17/19 1728 07/17/19 2233 07/18/19 0733 07/18/19 1343 07/19/19 0440 07/19/19 1656 07/20/19 0511  WBC 8.8  --  9.8   < > 5.4 7.5 7.3  NEUTROABS 6.9  --  7.4  --   --    --   --   HGB 5.4*   < > 8.1*   < > 7.2* 7.8* 7.2*  HCT 17.9*   < > 24.6*   < > 22.9* 24.4* 22.7*  MCV 99.4  --  91.8   < > 93.5 92.8 92.7  PLT 398  --  353   < > 333 367 347   < > = values in this interval not displayed.   Basic Metabolic Panel: Recent Labs  Lab 07/18/19 0733 07/19/19 0440 07/20/19 0511  NA 137 138 137  K 3.4* 3.6 3.8  CL 105 111 105  CO2 22 23 26   GLUCOSE 110* 133* 88  BUN 15 11 7*  CREATININE 0.84 0.86 0.73  CALCIUM 8.0* 7.7* 8.2*  MG  --  2.1  --    GFR: Estimated Creatinine Clearance: 75.3 mL/min (by C-G formula based on SCr of 0.73 mg/dL).  Liver Function Tests: Recent Labs  Lab 07/17/19 1728 07/19/19 0440  AST 30 32  ALT 10 11  ALKPHOS 77 74  BILITOT 0.5 0.9  PROT 6.0* 5.3*  ALBUMIN 3.2* 2.7*   Recent Labs  Lab 07/17/19 1728  LIPASE 23    Cardiac Enzymes: Recent Labs  Lab 07/17/19 1728  CKTOTAL 112    HbA1C: Hemoglobin A1C  Date/Time Value Ref Range Status  11/20/2018 09:43 AM 5.1 4.0 - 5.6 % Final     Recent Results (from the past 240 hour(s))  SARS Coronavirus 2 by RT PCR (hospital order, performed in [place] hospital lab) Nasopharyngeal Nasopharyngeal Swab     Status: None   Collection Time: 07/17/19  5:55 PM   Specimen: Nasopharyngeal Swab  Result Value Ref Range Status   SARS Coronavirus 2 NEGATIVE NEGATIVE Final    Comment: (NOTE) SARS-CoV-2 target nucleic acids are NOT DETECTED.  The SARS-CoV-2 RNA is generally detectable in upper and lower respiratory specimens during the acute phase of infection. The lowest concentration of SARS-CoV-2 viral copies this assay can detect is 250 copies / mL. A negative result does not preclude SARS-CoV-2 infection and should not be used as the sole basis for treatment or other patient management decisions.  A negative result may occur with improper specimen collection / handling, submission of specimen other than nasopharyngeal swab, presence of viral mutation(s) within  the areas targeted by this assay, and inadequate number of viral copies (<250 copies / mL). A negative result must be combined with clinical observations, patient history, and epidemiological information.  Fact Sheet for Patients:   StrictlyIdeas.no  Fact Sheet for Healthcare Providers: BankingDealers.co.za  This test is not yet approved or  cleared by the [place] FDA and has been authorized for detection and/or diagnosis of SARS-CoV-2 by FDA under an Emergency Use Authorization (EUA).  This EUA will remain in effect (meaning this test can be used) for the duration of the COVID-19 declaration  under Section 564(b)(1) of the Act, 21 U.S.C. section 360bbb-3(b)(1), unless the authorization is terminated or revoked sooner.  Performed at [facility], [place] [address] [place], [place] [address]      Scheduled Meds: . sodium chloride   Intravenous Once  . amitriptyline  50 mg Oral 3 times per day  . amLODipine  10 mg Oral Daily  . atorvastatin  40 mg Oral Daily  . baclofen  10 mg Oral BID  . cloNIDine  0.1 mg Oral TID  . pantoprazole  40 mg Oral BID  . polyethylene glycol  34 g Oral Daily  . sucralfate  1 g Oral TID WC & HS      LOS: 2 days   Cherene Altes, MD [facility] Office  [phone] Pager - Text Page per Amion  If 7PM-7AM, please contact night-coverage per Amion 07/20/2019, 3:38 PM

## 2019-07-21 LAB — TYPE AND SCREEN
ABO/RH(D): O POS
Antibody Screen: NEGATIVE
Unit division: 0
Unit division: 0
Unit division: 0

## 2019-07-21 LAB — CBC
HCT: 27.4 % — ABNORMAL LOW (ref 39.0–52.0)
Hemoglobin: 8.6 g/dL — ABNORMAL LOW (ref 13.0–17.0)
MCH: 28.8 pg (ref 26.0–34.0)
MCHC: 31.4 g/dL (ref 30.0–36.0)
MCV: 91.6 fL (ref 80.0–100.0)
Platelets: 375 10*3/uL (ref 150–400)
RBC: 2.99 MIL/uL — ABNORMAL LOW (ref 4.22–5.81)
RDW: 15.6 % — ABNORMAL HIGH (ref 11.5–15.5)
WBC: 7.9 10*3/uL (ref 4.0–10.5)
nRBC: 0 % (ref 0.0–0.2)

## 2019-07-21 LAB — BPAM RBC
Blood Product Expiration Date: 202107202359
Blood Product Expiration Date: 202107202359
Blood Product Expiration Date: 202107222359
ISSUE DATE / TIME: 202106162351
ISSUE DATE / TIME: 202106170257
ISSUE DATE / TIME: 202106191625
Unit Type and Rh: 5100
Unit Type and Rh: 5100
Unit Type and Rh: 5100

## 2019-07-21 MED ORDER — AMLODIPINE BESYLATE 10 MG PO TABS: 10.0000 mg | ORAL_TABLET | Freq: Every day | ORAL | 1 refills | Status: DC

## 2019-07-21 MED ORDER — PANTOPRAZOLE SODIUM 40 MG PO TBEC: 40.0000 mg | DELAYED_RELEASE_TABLET | Freq: Two times a day (BID) | ORAL | 1 refills | Status: DC

## 2019-07-21 MED ORDER — SUCRALFATE 1 GM/10ML PO SUSP: 1.0000 g | Freq: Three times a day (TID) | ORAL | 0 refills | Status: DC

## 2019-07-21 MED ORDER — ACETAMINOPHEN 325 MG PO TABS: 650.0000 mg | ORAL_TABLET | Freq: Four times a day (QID) | ORAL | Status: DC | PRN

## 2019-07-21 NOTE — Plan of Care (Signed)
  Problem: Education: Goal: Knowledge of General Education information will improve Description: Including pain rating scale, medication(s)/side effects and non-pharmacologic comfort measures Outcome: Adequate for Discharge   Problem: Activity: Goal: Risk for activity intolerance will decrease Outcome: Adequate for Discharge   Problem: Nutrition: Goal: Adequate nutrition will be maintained Outcome: Adequate for Discharge   Problem: Elimination: Goal: Will not experience complications related to bowel motility Outcome: Adequate for Discharge Goal: Will not experience complications related to urinary retention Outcome: Adequate for Discharge   Problem: Pain Managment: Goal: General experience of comfort will improve Outcome: Adequate for Discharge   Problem: Safety: Goal: Ability to remain free from injury will improve Outcome: Adequate for Discharge   Problem: Skin Integrity: Goal: Risk for impaired skin integrity will decrease Outcome: Adequate for Discharge   

## 2019-07-21 NOTE — Discharge Instructions (Signed)
DO NOT USE NSAID PAIN MEDICATIONS (Goody powder, BC powder, Advil, Motrin, Aleve, Aspirin, etc.)   Peptic Ulcer  A peptic ulcer is a sore in the lining of the stomach (gastric ulcer) or the first part of the small intestine (duodenal ulcer). The ulcer causes a gradual wearing away (erosion) of the deeper tissue. What are the causes? Normally, the lining of the stomach and the small intestine protects them from the acid that digests food. The protective lining can be damaged by:  An infection caused by a type of bacteria called Helicobacter pylori or H. pylori.  Regular use of NSAIDs, such as ibuprofen or aspirin.  Rare tumors in the stomach, small intestine, or pancreas (Zollinger-Ellison syndrome). What increases the risk? The following factors may make you more likely to develop this condition:  Smoking.  Having a family history of ulcer disease.  Drinking alcohol.  Having been hospitalized in an intensive care unit (ICU). What are the signs or symptoms? Symptoms of this condition include:  Persistent burning pain in the area between the chest and the belly button. The pain may be worse on an empty stomach and at night.  Heartburn.  Nausea and vomiting.  Bloating. If the ulcer results in bleeding, it can cause:  Black, tarry stools.  Vomiting of bright red blood.  Vomiting of material that looks like coffee grounds. How is this diagnosed? This condition may be diagnosed based on:  Your medical history and a physical exam.  Various tests or procedures, such as: ? Blood tests, stool tests, or breath tests to check for the H. pylori bacteria. ? An X-ray exam (upper gastrointestinal series) of the esophagus, stomach, and small intestine. ? Upper endoscopy. The health care provider examines the esophagus, stomach, and small intestine using a small flexible tube that has a video camera at the end. ? Biopsy. A tissue sample is removed to be examined under a  microscope. How is this treated? Treatment for this condition may include:  Eliminating the cause of the ulcer, such as smoking or use of NSAIDs, and limiting alcohol and caffeine intake.  Medicines to reduce the amount of acid in your digestive tract.  Antibiotic medicines, if the ulcer is caused by an H. pylori infection.  An upper endoscopy may be used to treat a bleeding ulcer.  Surgery. This may be needed if the bleeding is severe or if the ulcer created a hole somewhere in the digestive system. Follow these instructions at home:  Do not drink alcohol if your health care provider tells you not to drink.  Do not use any products that contain nicotine or tobacco, such as cigarettes, e-cigarettes, and chewing tobacco. If you need help quitting, ask your health care provider.  Take over-the-counter and prescription medicines only as told by your health care provider. ? Do not use over-the-counter medicines in place of prescription medicines unless your health care provider approves. ? Do not take aspirin, ibuprofen, or other NSAIDs unless your health care provider told you to do so.  Take over-the-counter and prescription medicines only as told by your health care provider.  Keep all follow-up visits as told by your health care provider. This is important. Contact a health care provider if:  Your symptoms do not improve within 7 days of starting treatment.  You have ongoing indigestion or heartburn. Get help right away if:  You have sudden, sharp, or persistent pain in your abdomen.  You have bloody or dark black, tarry stools.  You vomit  blood or material that looks like coffee grounds.  You become light-headed or you feel faint.  You become weak.  You become sweaty or clammy. Summary  A peptic ulcer is a sore in the lining of the stomach (gastric ulcer) or the first part of the small intestine (duodenal ulcer). The ulcer causes a gradual wearing away (erosion) of the  deeper tissue.  Do not use any products that contain nicotine or tobacco, such as cigarettes, e-cigarettes, and chewing tobacco. If you need help quitting, ask your health care provider.  Take over-the-counter and prescription medicines only as told by your health care provider. Do not use over-the-counter medicines in place of prescription medicines unless your health care provider approves.  Contact your health care provider if you have ongoing indigestion or heartburn.  Keep all follow-up visits as told by your health care provider. This is important. This information is not intended to replace advice given to you by your health care provider. Make sure you discuss any questions you have with your health care provider. Document Revised: 07/25/2017 Document Reviewed: 07/25/2017 Elsevier Patient Education  [place] A soft-food eating plan includes foods that are safe and easy to chew and swallow. Your health care provider or dietitian can help you find foods and flavors that fit into this plan. Follow this plan until your health care provider or dietitian says it is safe to start eating other foods and food textures. What are tips for following this plan? General guidelines   Take small bites of food, or cut food into pieces about  inch or smaller. Bite-sized pieces of food are easier to chew and swallow.  Eat moist foods. Avoid overly dry foods.  Avoid foods that: ? Are difficult to swallow, such as dry, chunky, crispy, or sticky foods. ? Are difficult to chew, such as hard, tough, or stringy foods. ? Contain nuts, seeds, or fruits.  Follow instructions from your dietitian about the types of liquids that are safe for you to swallow. You may be allowed to have: ? Thick liquids only. This includes only liquids that are thicker than honey. ? Thin and thick liquids. This includes all beverages and foods that become liquid at room temperature.  To  make thick liquids: ? Purchase a commercial liquid thickening powder. These are available at grocery stores and pharmacies. ? Mix the thickener into liquids according to instructions on the label. ? Purchase ready-made thickened liquids. ? Thicken soup by pureeing, straining to remove chunks, and adding flour, potato flakes, or corn starch. ? Add commercial thickener to foods that become liquid at room temperature, such as milk shakes, yogurt, ice cream, gelatin, and sherbet.  Ask your health care provider whether you need to take a fiber supplement. Cooking  Cook meats so they stay tender and moist. Use methods like braising, stewing, or baking in liquid.  Cook vegetables and fruit until they are soft enough to be mashed with a fork.  Peel soft, fresh fruits such as peaches, nectarines, and melons.  When making soup, make sure chunks of meat and vegetables are smaller than  inch.  Reheat leftover foods slowly so that a tough crust does not form. What foods are allowed? The items listed below may not be a complete list. Talk with your dietitian about what dietary choices are best for you. Grains Breads, muffins, pancakes, or waffles moistened with syrup, jelly, or butter. Dry cereals well-moistened with milk. Moist, cooked cereals. Well-cooked pasta  and rice. Vegetables All soft-cooked vegetables. Shredded lettuce. Fruits All canned and cooked fruits. Soft, peeled fresh fruits. Strawberries. Dairy Milk. Cream. Yogurt. Cottage cheese. Soft cheese without the rind. Meats and other protein foods Tender, moist ground meat, poultry, or fish. Meat cooked in gravy or sauces. Eggs. Sweets and desserts Ice cream. Milk shakes. Sherbet. Pudding. Fats and oils Butter. Margarine. Olive, canola, sunflower, and grapeseed oil. Smooth salad dressing. Smooth cream cheese. Mayonnaise. Gravy. What foods are not allowed? The items listed bemay not be a complete list. Talk with your dietitian about  what dietary choices are best for you. Grains Coarse or dry cereals, such as bran, granola, and shredded wheat. Tough or chewy crusty breads, such as [place] bread or baguettes. Breads with nuts, seeds, or fruit. Vegetables All raw vegetables. Cooked corn. Cooked vegetables that are tough or stringy. Tough, crisp, fried potatoes and potato skins. Fruits Fresh fruits with skins or seeds, or both, such as apples, pears, and grapes. Stringy, high-pulp fruits, such as papaya, pineapple, coconut, and mango. Fruit leather and all dried fruit. Dairy Yogurt with nuts or coconut. Meats and other protein foods Hard, dry sausages. Dry meat, poultry, or fish. Meats with gristle. Fish with bones. Fried meat or fish. Lunch meat and hotdogs. Nuts and seeds. Chunky peanut butter or other nut butters. Sweets and desserts Cakes or cookies that are very dry or chewy. Desserts with dried fruit, nuts, or coconut. Fried pastries. Very rich pastries. Fats and oils Cream cheese with fruit or nuts. Salad dressings with seeds or chunks. Summary  A soft-food eating plan includes foods that are safe and easy to swallow. Generally, the foods should be soft enough to be mashed with a fork.  Avoid foods that are dry, hard to chew, crunchy, sticky, stringy, or crispy.  Ask your health care provider whether you need to thicken your liquids and if you need to take a fiber supplement. This information is not intended to replace advice given to you by your health care provider. Make sure you discuss any questions you have with your health care provider. Document Revised: 05/10/2018 Document Reviewed: 03/22/2016 Elsevier Patient Education  [place].

## 2019-07-21 NOTE — Evaluation (Signed)
Occupational Therapy Evaluation Patient Details Name: Michael Bender MRN: [id number] DOB: 13-Jan-1946 Today's Date: 07/21/2019    History of Present Illness Michael Bender is a 74 y.o. male with medical history significant of COPD, hypertension, hyperlipidemia, chronic back pain, gastritis, colon polyps presenting with complaints of severe exertional dyspnea and fatigue for the past few days.In the ED the patient was found to have a Hgb of 5.4 and BUN of 25.  CT abdomen noted a large stool burden but otherwise no acute findings   Clinical Impression   Patient with functional deficits listed below impacting safety and independence with self care. Patient min G for functional ambulation to/from bathroom and with transfers due to mild unsteadiness and impulsivity sitting quickly onto bedside commode in order to perform g/h tasks seated at sink. Will continue to follow.     Follow Up Recommendations  No OT follow up    Equipment Recommendations  Tub/shower seat       Precautions / Restrictions Precautions Precautions: Fall Restrictions Weight Bearing Restrictions: No      Mobility Bed Mobility Overal bed mobility: Modified Independent                Transfers Overall transfer level: Needs assistance Equipment used: Rolling walker (2 wheeled) Transfers: Sit to/from Stand Sit to Stand: Min guard         General transfer comment: for safety due to mild unsteadiness    Balance Overall balance assessment: Needs assistance Sitting-balance support: No upper extremity supported;Feet supported Sitting balance-Leahy Scale: Good     Standing balance support: Bilateral upper extremity supported;During functional activity Standing balance-Leahy Scale: Poor Standing balance comment: reliant on B UE support                           ADL either performed or assessed with clinical judgement   ADL Overall ADL's : Needs assistance/impaired     Grooming: Oral  care;Wash/dry face;Wash/dry hands;Set up;Sitting   Upper Body Bathing: Set up;Sitting   Lower Body Bathing: Min guard;Sitting/lateral leans;Sit to/from stand   Upper Body Dressing : Set up;Sitting   Lower Body Dressing: Min guard;Sit to/from stand;Sitting/lateral leans   Toilet Transfer: Min guard;Ambulation;RW;BSC Toilet Transfer Details (indicate cue type and reason): min guard for mild unsteadiness, patient mildly impulsive turning quickly to sit onto commode Toileting- Clothing Manipulation and Hygiene: Min guard;Sit to/from stand       Functional mobility during ADLs: Min guard;Rolling walker;Cueing for safety                    Pertinent Vitals/Pain Pain Assessment: Faces Faces Pain Scale: No hurt     Hand Dominance Right   Extremity/Trunk Assessment Upper Extremity Assessment Upper Extremity Assessment: Overall WFL for tasks assessed   Lower Extremity Assessment Lower Extremity Assessment: Defer to PT evaluation   Cervical / Trunk Assessment Cervical / Trunk Assessment: Normal   Communication Communication Communication: No difficulties   Cognition Arousal/Alertness: Awake/alert Behavior During Therapy: WFL for tasks assessed/performed Overall Cognitive Status: Within Functional Limits for tasks assessed                                 General Comments: patient frustrated has been waiting for morning medications for ~1hr, notified RN              Home Living Family/patient expects to be discharged to:: Private  residence Living Arrangements: Alone Available Help at Discharge: Family;Available PRN/intermittently;Friend(s) Type of Home: House Home Access: Stairs to enter [employer] of Steps: 1   Home Layout: One level     Bathroom Shower/Tub: Teacher, early years/pre: Standard     Home Equipment: Walker - standard;Cane - single point          Prior Functioning/Environment Level of Independence:  Independent with assistive device(s)        Comments: uses cane intermittently        OT Problem List: Decreased activity tolerance;Impaired balance (sitting and/or standing);Decreased safety awareness      OT Treatment/Interventions: Self-care/ADL training;Therapeutic exercise;DME and/or AE instruction;Therapeutic activities;Patient/family education;Balance training    OT Goals(Current goals can be found in the care plan section) Acute Rehab OT Goals Patient Stated Goal: to go home OT Goal Formulation: With patient Time For Goal Achievement: 08/05/19 Potential to Achieve Goals: Good  OT Frequency: Min 2X/week    AM-PAC OT "6 Clicks" Daily Activity     Outcome Measure Help from another person eating meals?: None Help from another person taking care of personal grooming?: A Little Help from another person toileting, which includes using toliet, bedpan, or urinal?: A Little Help from another person bathing (including washing, rinsing, drying)?: A Little Help from another person to put on and taking off regular upper body clothing?: A Little Help from another person to put on and taking off regular lower body clothing?: A Little 6 Click Score: 19   End of Session Equipment Utilized During Treatment: Rolling walker Nurse Communication: Mobility status  Activity Tolerance: Patient tolerated treatment well Patient left: in chair;with call bell/phone within reach  OT Visit Diagnosis: Unsteadiness on feet (R26.81);Other abnormalities of gait and mobility (R26.89)                Time: 3557-3220 OT Time Calculation (min): 18 min Charges:  OT General Charges $OT Visit: 1 Visit OT Evaluation $OT Eval Moderate Complexity: Phil Campbell OT Pager: [place] 07/21/2019, 10:45 AM

## 2019-07-21 NOTE — Discharge Summary (Signed)
DISCHARGE SUMMARY  Michael Bender  MR#: [id number]  DOB:1945/04/13  Date of Admission: 07/17/2019 Date of Discharge: 07/21/2019  Attending Physician:[facility] Silvestre Gunner, MD  Patient's IHK:[id number], Michael Expose, MD  Consults: [facility]  Disposition: D/C home   Follow-up Appts:  Follow-up Information    Michael, Betty G, MD Follow up in 1 week(s).   Specialty: Family Medicine Contact information: [address] [place] [address] [phone]        Michael Boop, MD Follow up.   Specialty: Gastroenterology Why: The office will call you to arrange for a lab check in 1 week, and then a visit with the doctor in 2 weeks.  Contact information: [address] [place] [address] [phone]               Tests Needing Follow-up: -recheck Hgb 1 week post-d/c (to be arranged by GI office) -follow up appt in office with Dr. [name] in 2 to 3 weeks - will need a repeat EGD with Dr. [name] in 8 weeks  -recheck BP   Discharge Diagnoses: Large duodenal ulcers with duodenal stenosis Anemia of acute and chronic blood loss  Severe Fe deficiency HTN urgency Mild hypokalemia Acute kidney injury  Chorea  HLD COPD Chronic back pain   Initial presentation: 73yo w/ a hx of COPD, HTN, HLD, chronic back pain, gastritis, and colon polyps who presented with exertional dyspnea. Patient noted transient development of melena in January but since then no hematemesis or hematochezia. He reported intermittent right upper quadrant abdominal pain. ROS also revealed an unintentional 14 pound weight loss over the past 4 months. In the ED the patient was found to have a Hgb of 5.4 and BUN of 25. CT abdomen noted a large stool burden but otherwise no acute findings.  Hospital Course: 6/16 admit via [facility] ED -transfused 2 units PRBC 6/18 EGD -large duodenal ulcers with duodenal stenosis 6/20 D/C home   Large duodenal ulcers with duodenal stenosis Diagnosed via EGD 6/18 -  Protonix 40 mg twice daily x8 weeks then daily - Carafate for 4 weeks - advanced to soft diet only - to follow-up with GI as outpatient - Colonoscopy February 2021 (Dr. [name]) revealed 3 sessile polyps (tubular adenoma) and EGD at that time noted only gastritis   Anemia of acute v/s chronic blood loss  S/p 3U PRBC total this admit - Hgb 8.6 at time of d/c   Severe Fe deficiency Due to GI losses - dosed w/ IV Fe 07/18/19  HTN  No prior diagnosis of HTN - BP consistently elevated during this admit - norvasc initiated - BP improved nicely - cont Norvasc as outpt w/ f/u of BP by PCP   Mild hypokalemia Due to poor oral intake - supplemented into normal range prior to d/c   Acute kidney injury  Resolved w/ volume expansion/transfusion  Chorea  Undergoing outpt w/u per Neurology (Dr. Arbutus Leas)  HLD Continue usual medical treatment  COPD Well compensated during this hospital stay   Chronic back pain  Controlled at this time   Allergies as of 07/21/2019      Reactions   Nubain [nalbuphine Hcl]    Muscle contraction      Medication List    STOP taking these medications   BC HEADACHE POWDER PO   omeprazole 40 MG capsule Commonly known as: PRILOSEC Replaced by: pantoprazole 40 MG tablet     TAKE these medications   acetaminophen 325 MG tablet Commonly known as: TYLENOL  Take 2 tablets (650 mg total) by mouth every 6 (six) hours as needed for mild pain (or Fever >/= 101).   Advair Diskus 250-50 MCG/DOSE Aepb Generic drug: Fluticasone-Salmeterol Inhale 1 puff into the lungs 2 (two) times daily as needed (sob/wheezing).   amitriptyline 50 MG tablet Commonly known as: ELAVIL Take 50 mg by mouth in the morning, at noon, and at bedtime.   amLODipine 10 MG tablet Commonly known as: NORVASC Take 1 tablet (10 mg total) by mouth daily. Start taking on: July 22, 2019   atorvastatin 40 MG tablet Commonly known as: LIPITOR Take 1 tablet (40 mg total) by mouth daily.     baclofen 10 MG tablet Commonly known as: LIORESAL Take 10 mg by mouth 2 (two) times daily.   oxycodone 30 MG immediate release tablet Commonly known as: ROXICODONE Take 30 mg by mouth 3 (three) times daily.   pantoprazole 40 MG tablet Commonly known as: PROTONIX Take 1 tablet (40 mg total) by mouth 2 (two) times daily. Replaces: omeprazole 40 MG capsule   polyethylene glycol 17 Bender packet Commonly known as: MIRALAX / GLYCOLAX Take 34 Bender by mouth daily.   sucralfate 1 GM/10ML suspension Commonly known as: CARAFATE Take 10 mLs (1 Bender total) by mouth 4 (four) times daily -  with meals and at bedtime.       Day of Discharge BP 129/72   Pulse 73   Temp 97.6 F (36.4 C) (Oral)   Resp 16   Ht 5\' 11"  (1.803 m)   Wt 65.7 kg   SpO2 98%   BMI 20.20 kg/m   Physical Exam: General: No acute respiratory distress Lungs: Clear to auscultation bilaterally without wheezes or crackles Cardiovascular: Regular rate and rhythm without murmur gallop or rub normal S1 and S2 Abdomen: Nontender, nondistended, soft, bowel sounds positive, no rebound, no ascites, no appreciable mass Extremities: No significant cyanosis, clubbing, or edema bilateral lower extremities  Basic Metabolic Panel: Recent Labs  Lab 07/17/19 1728 07/18/19 0733 07/19/19 0440 07/20/19 0511  NA 137 137 138 137  K 3.7 3.4* 3.6 3.8  CL 102 105 111 105  CO2 22 22 23 26   GLUCOSE 123* 110* 133* 88  BUN 25* 15 11 7*  CREATININE 1.47* 0.84 0.86 0.73  CALCIUM 7.9* 8.0* 7.7* 8.2*  MG  --   --  2.1  --     Liver Function Tests: Recent Labs  Lab 07/17/19 1728 07/19/19 0440  AST 30 32  ALT 10 11  ALKPHOS 77 74  BILITOT 0.5 0.9  PROT 6.0* 5.3*  ALBUMIN 3.2* 2.7*   Recent Labs  Lab 07/17/19 1728  LIPASE 23   Coags: Recent Labs  Lab 07/19/19 0440  INR 1.1   CBC: Recent Labs  Lab 07/17/19 1728 07/17/19 2233 07/18/19 0733 07/18/19 0733 07/18/19 1343 07/19/19 0440 07/19/19 1656 07/20/19 0511  07/21/19 0410  WBC 8.8  --  9.8   < > 6.9 5.4 7.5 7.3 7.9  NEUTROABS 6.9  --  7.4  --   --   --   --   --   --   HGB 5.4*   < > 8.1*   < > 7.1* 7.2* 7.8* 7.2* 8.6*  HCT 17.9*   < > 24.6*   < > 22.1* 22.9* 24.4* 22.7* 27.4*  MCV 99.4  --  91.8   < > 92.1 93.5 92.8 92.7 91.6  PLT 398  --  353   < > 333 333 367  347 375   < > = values in this interval not displayed.    Cardiac Enzymes: Recent Labs  Lab 07/17/19 1728  CKTOTAL 112   BNP (last 3 results) Recent Labs    07/17/19 1728  BNP 162.8*    Recent Results (from the past 240 hour(s))  SARS Coronavirus 2 by RT PCR (hospital order, performed in [facility] hospital lab) Nasopharyngeal Nasopharyngeal Swab     Status: None   Collection Time: 07/17/19  5:55 PM   Specimen: Nasopharyngeal Swab  Result Value Ref Range Status   SARS Coronavirus 2 NEGATIVE NEGATIVE Final    Comment: (NOTE) SARS-CoV-2 target nucleic acids are NOT DETECTED.  The SARS-CoV-2 RNA is generally detectable in upper and lower respiratory specimens during the acute phase of infection. The lowest concentration of SARS-CoV-2 viral copies this assay can detect is 250 copies / mL. A negative result does not preclude SARS-CoV-2 infection and should not be used as the sole basis for treatment or other patient management decisions.  A negative result may occur with improper specimen collection / handling, submission of specimen other than nasopharyngeal swab, presence of viral mutation(s) within the areas targeted by this assay, and inadequate number of viral copies (<250 copies / mL). A negative result must be combined with clinical observations, patient history, and epidemiological information.  Fact Sheet for Patients:   BoilerBrush.com.cy  Fact Sheet for Healthcare Providers: https://pope.com/  This test is not yet approved or  cleared by the [place] FDA and has been authorized for detection and/or  diagnosis of SARS-CoV-2 by FDA under an Emergency Use Authorization (EUA).  This EUA will remain in effect (meaning this test can be used) for the duration of the COVID-19 declaration under Section 564(b)(1) of the Act, 21 U.S.C. section 360bbb-3(b)(1), unless the authorization is terminated or revoked sooner.  Performed at [facility], [address], [place], [place] [address]       Time spent in discharge (includes decision making & examination of pt): 35 minutes  07/21/2019, 10:40 AM   Lonia Blood, MD [facility] Office  [phone]

## 2019-07-22 ENCOUNTER — Encounter (HOSPITAL_COMMUNITY): Payer: Self-pay | Admitting: Gastroenterology

## 2019-07-22 ENCOUNTER — Other Ambulatory Visit: Payer: Self-pay

## 2019-07-22 LAB — GLIA (IGA/G) + TTG IGA
Antigliadin Abs, IgA: 3 units (ref 0–19)
Gliadin IgG: 2 units (ref 0–19)
Tissue Transglutaminase Ab, IgA: <2 U/mL (ref 0–3)

## 2019-07-22 LAB — SURGICAL PATHOLOGY

## 2019-07-22 NOTE — Telephone Encounter (Signed)
Spoke with the son of the patient who helps the patient with medical appointments and care. He will bring the patient in for his labs. The appointment needs to be moved. The appointment rescheduled to 08/14/19 at 1:30 pm.

## 2019-07-22 NOTE — Telephone Encounter (Signed)
Pt's son Is wanting to reschedule the appt from 08/14/19 @1 :30pm, this date does not work for the pt's son. He would like a different date if possible.

## 2019-07-23 ENCOUNTER — Encounter: Payer: Self-pay | Admitting: Gastroenterology

## 2019-07-23 NOTE — Telephone Encounter (Signed)
The patient cannot come to the appointment on his own. The son teaches young children and will be unable to bring the patient on 08/14/19. He is available the week of 08/05/19. APP is on hospital service that week. The patient is s/p hospitalization for a GI bleed. He will have his follow up labs done on schedule. Appointment moved to Dr Celesta Aver schedule.

## 2019-07-26 ENCOUNTER — Other Ambulatory Visit (INDEPENDENT_AMBULATORY_CARE_PROVIDER_SITE_OTHER): Payer: Medicare HMO

## 2019-07-26 DIAGNOSIS — K921 Melena: Secondary | ICD-10-CM | POA: Diagnosis not present

## 2019-07-26 LAB — CBC WITH DIFFERENTIAL/PLATELET
Basophils Absolute: 0 10*3/uL (ref 0.0–0.1)
Basophils Relative: 0.5 % (ref 0.0–3.0)
Eosinophils Absolute: 0.2 10*3/uL (ref 0.0–0.7)
Eosinophils Relative: 3.2 % (ref 0.0–5.0)
HCT: 31.7 % — ABNORMAL LOW (ref 39.0–52.0)
Hemoglobin: 10.4 g/dL — ABNORMAL LOW (ref 13.0–17.0)
Lymphocytes Relative: 22.1 % (ref 12.0–46.0)
Lymphs Abs: 1.3 10*3/uL (ref 0.7–4.0)
MCHC: 32.9 g/dL (ref 30.0–36.0)
MCV: 90.9 fl (ref 78.0–100.0)
Monocytes Absolute: 0.4 10*3/uL (ref 0.1–1.0)
Monocytes Relative: 7.3 % (ref 3.0–12.0)
Neutro Abs: 3.9 10*3/uL (ref 1.4–7.7)
Neutrophils Relative %: 66.9 % (ref 43.0–77.0)
Platelets: 438 10*3/uL — ABNORMAL HIGH (ref 150.0–400.0)
RBC: 3.49 Mil/uL — ABNORMAL LOW (ref 4.22–5.81)
RDW: 16.9 % — ABNORMAL HIGH (ref 11.5–15.5)
WBC: 5.8 10*3/uL (ref 4.0–10.5)

## 2019-08-01 ENCOUNTER — Ambulatory Visit: Payer: Medicare HMO | Admitting: Nurse Practitioner

## 2019-08-05 ENCOUNTER — Other Ambulatory Visit: Payer: Self-pay | Admitting: Family Medicine

## 2019-08-05 DIAGNOSIS — K219 Gastro-esophageal reflux disease without esophagitis: Secondary | ICD-10-CM

## 2019-08-05 DIAGNOSIS — K5909 Other constipation: Secondary | ICD-10-CM

## 2019-08-06 ENCOUNTER — Encounter: Payer: Self-pay | Admitting: Internal Medicine

## 2019-08-06 ENCOUNTER — Other Ambulatory Visit (INDEPENDENT_AMBULATORY_CARE_PROVIDER_SITE_OTHER): Payer: Medicare HMO

## 2019-08-06 ENCOUNTER — Ambulatory Visit: Payer: Medicare HMO | Admitting: Internal Medicine

## 2019-08-06 VITALS — BP 102/70 | HR 82 | Ht 71.0 in | Wt 146.0 lb

## 2019-08-06 DIAGNOSIS — K264 Chronic or unspecified duodenal ulcer with hemorrhage: Secondary | ICD-10-CM

## 2019-08-06 DIAGNOSIS — T39395A Adverse effect of other nonsteroidal anti-inflammatory drugs [NSAID], initial encounter: Secondary | ICD-10-CM | POA: Diagnosis not present

## 2019-08-06 DIAGNOSIS — K269 Duodenal ulcer, unspecified as acute or chronic, without hemorrhage or perforation: Secondary | ICD-10-CM

## 2019-08-06 DIAGNOSIS — Z01818 Encounter for other preprocedural examination: Secondary | ICD-10-CM

## 2019-08-06 DIAGNOSIS — D500 Iron deficiency anemia secondary to blood loss (chronic): Secondary | ICD-10-CM | POA: Diagnosis not present

## 2019-08-06 DIAGNOSIS — K2100 Gastro-esophageal reflux disease with esophagitis, without bleeding: Secondary | ICD-10-CM | POA: Diagnosis not present

## 2019-08-06 LAB — CBC WITH DIFFERENTIAL/PLATELET
Basophils Absolute: 0 10*3/uL (ref 0.0–0.1)
Basophils Relative: 0.6 % (ref 0.0–3.0)
Eosinophils Absolute: 0.1 10*3/uL (ref 0.0–0.7)
Eosinophils Relative: 1.5 % (ref 0.0–5.0)
HCT: 31.9 % — ABNORMAL LOW (ref 39.0–52.0)
Hemoglobin: 10.6 g/dL — ABNORMAL LOW (ref 13.0–17.0)
Lymphocytes Relative: 16.1 % (ref 12.0–46.0)
Lymphs Abs: 1.1 10*3/uL (ref 0.7–4.0)
MCHC: 33.3 g/dL (ref 30.0–36.0)
MCV: 90.4 fl (ref 78.0–100.0)
Monocytes Absolute: 0.5 10*3/uL (ref 0.1–1.0)
Monocytes Relative: 7 % (ref 3.0–12.0)
Neutro Abs: 5.1 10*3/uL (ref 1.4–7.7)
Neutrophils Relative %: 74.8 % (ref 43.0–77.0)
Platelets: 236 10*3/uL (ref 150.0–400.0)
RBC: 3.53 Mil/uL — ABNORMAL LOW (ref 4.22–5.81)
RDW: 16.5 % — ABNORMAL HIGH (ref 11.5–15.5)
WBC: 6.8 10*3/uL (ref 4.0–10.5)

## 2019-08-06 LAB — FERRITIN: Ferritin: 68.2 ng/mL (ref 22.0–322.0)

## 2019-08-06 NOTE — Progress Notes (Signed)
Michael Bender 74 y.o. 10-28-1945 [id number]  Assessment & Plan:   Encounter Diagnoses  Name Primary?  . Duodenal ulcer with hemorrhage and obstruction Yes  . Iron deficiency anemia due to chronic blood loss   . Gastroesophageal reflux disease with esophagitis, unspecified whether hemorrhage   . NSAID-induced duodenal ulcer     Follow-up EGD in mid or late August.  I think it will be important to make sure that these abnormalities are resolved.  Suspicion of malignancy very low but he had pretty significant ulcer disease and a duodenal stenosis.  We believe it was all NSAID induced.  Continue PPI as planned taper to 1 a day later.  Check ferritin and CBC today.  May very well need some oral iron supplementation as well.  CC: Martinique, Betty G, MD    Subjective:   Chief Complaint:  HPI 74 year old white man here with his son, after having a GI bleed in the setting of salicylate/NSAID use.  I had performed an EGD and a colonoscopy in February which showed gastritis and 3 diminutive adenomas.  Then in June of this year he was hospitalized with profound anemia and iron deficiency was heme positive and had an EGD on June 18 by Dr. Bryan Lemma as follows:  LA Grade B reflux esophagitis with no bleeding. - Normal upper third of esophagus and middle third of esophagus. - Gastritis. Biopsied. - Normal gastric fundus and gastric body. - Non-bleeding duodenal ulcers with no stigmata of bleeding. Biopsied. - Acquired duodenal stenosis.  Biopsies were all benign and there was no H. pylori.  He was given parenteral iron in the hospital but is not on any as an outpatient.  He denies use of NSAIDs anymore, corroborated by son.  Hemoglobin was 8.6 on discharge and is 10.4 on 6/25 follow-up labs. He had presented with a hemoglobin of 7.5 and 7.3.  He has not been vaccinated for Covid. Allergies  Allergen Reactions  . Nubain [Nalbuphine Hcl]     Muscle contraction   Current Meds    Medication Sig  . acetaminophen (TYLENOL) 325 MG tablet Take 2 tablets (650 mg total) by mouth every 6 (six) hours as needed for mild pain (or Fever >/= 101).  . ADVAIR DISKUS 250-50 MCG/DOSE AEPB Inhale 1 puff into the lungs 2 (two) times daily as needed (sob/wheezing).   Marland Kitchen amitriptyline (ELAVIL) 50 MG tablet Take 50 mg by mouth in the morning, at noon, and at bedtime.   Marland Kitchen amLODipine (NORVASC) 10 MG tablet Take 1 tablet (10 mg total) by mouth daily.  Marland Kitchen atorvastatin (LIPITOR) 40 MG tablet Take 1 tablet (40 mg total) by mouth daily.  . baclofen (LIORESAL) 10 MG tablet Take 10 mg by mouth 2 (two) times daily.   Marland Kitchen oxycodone (ROXICODONE) 30 MG immediate release tablet Take 30 mg by mouth 3 (three) times daily.   . pantoprazole (PROTONIX) 40 MG tablet Take 1 tablet (40 mg total) by mouth 2 (two) times daily.  . polyethylene glycol (MIRALAX / GLYCOLAX) 17 g packet Take 34 g by mouth daily.   . sucralfate (CARAFATE) 1 GM/10ML suspension Take 10 mLs (1 g total) by mouth 4 (four) times daily -  with meals and at bedtime.   Past Medical History:  Diagnosis Date  . Arthritis    Back  . Blood transfusion    as a child  . Chronic back pain   . COPD (chronic obstructive pulmonary disease) ([place])   . Headache(784.0)  last one 6 months ago  . Hemorrhoids, internal   . HLD (hyperlipidemia)   . Hypertension   . Neck rigidity    post cervical fusion  . Nocturia   . Pneumonia   . Prostate disease    Past Surgical History:  Procedure Laterality Date  . BIOPSY  07/19/2019   Procedure: BIOPSY;  Surgeon: Lavena Bullion, DO;  Location: [facility];  Service: Gastroenterology;;  . [place]   C2/C 3  four surgeries  . COLONOSCOPY    . ESOPHAGOGASTRODUODENOSCOPY  07/20/2011   Procedure: ESOPHAGOGASTRODUODENOSCOPY (EGD);  Surgeon: Gatha Mayer, MD;  Location: [facility];  Service: Endoscopy;  Laterality: N/A;  . ESOPHAGOGASTRODUODENOSCOPY (EGD) WITH PROPOFOL N/A 07/19/2019    Procedure: ESOPHAGOGASTRODUODENOSCOPY (EGD) WITH PROPOFOL;  Surgeon: Lavena Bullion, DO;  Location: [facility];  Service: Gastroenterology;  Laterality: N/A;  . SAVORY DILATION  07/20/2011   Procedure: SAVORY DILATION;  Surgeon: Gatha Mayer, MD;  Location: [facility];  Service: Endoscopy;  Laterality: N/A;  . 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  He drinks about a liter of caffeinated drinks a day past smoker no drug use no alcohol.   family history includes Heart attack (age of onset: 50) in his mother; Heart disease in his mother; Pancreatic cancer in his brother; Pancreatic cancer (age of onset: 28) in his father.   Review of Systems  As above  Objective:   Physical Exam BP 102/70   Pulse 82   Ht 5\' 11"  (1.803 m)   Wt 146 lb (66.2 kg)   BMI 20.36 kg/m  Thin elderly white man in no acute distress Lungs are clear Heart sounds are normal Abdomen is nontender

## 2019-08-06 NOTE — Patient Instructions (Signed)
Your provider has requested that you go to the basement level for lab work before leaving today. Press "B" on the elevator. The lab is located at the first door on the left as you exit the elevator.   You have been scheduled for an endoscopy. Please follow written instructions given to you at your visit today. If you use inhalers (even only as needed), please bring them with you on the day of your procedure.   I appreciate the opportunity to care for you. Silvano Rusk, MD, [facility]

## 2019-08-14 ENCOUNTER — Ambulatory Visit: Payer: Medicare HMO | Admitting: Nurse Practitioner

## 2019-08-21 ENCOUNTER — Ambulatory Visit: Payer: Medicare HMO | Admitting: Nurse Practitioner

## 2019-09-03 ENCOUNTER — Encounter: Payer: Self-pay | Admitting: Internal Medicine

## 2019-09-03 ENCOUNTER — Other Ambulatory Visit: Payer: Self-pay | Admitting: Family Medicine

## 2019-09-12 ENCOUNTER — Ambulatory Visit (INDEPENDENT_AMBULATORY_CARE_PROVIDER_SITE_OTHER): Payer: Medicare HMO

## 2019-09-12 ENCOUNTER — Other Ambulatory Visit: Payer: Self-pay | Admitting: Internal Medicine

## 2019-09-12 DIAGNOSIS — Z1159 Encounter for screening for other viral diseases: Secondary | ICD-10-CM

## 2019-09-12 LAB — SARS CORONAVIRUS 2 (TAT 6-24 HRS): SARS Coronavirus 2: NEGATIVE

## 2019-09-15 ENCOUNTER — Encounter: Payer: Self-pay | Admitting: Certified Registered Nurse Anesthetist

## 2019-09-16 ENCOUNTER — Other Ambulatory Visit (INDEPENDENT_AMBULATORY_CARE_PROVIDER_SITE_OTHER): Payer: Medicare HMO

## 2019-09-16 ENCOUNTER — Other Ambulatory Visit: Payer: Self-pay

## 2019-09-16 ENCOUNTER — Ambulatory Visit (AMBULATORY_SURGERY_CENTER): Payer: Medicare HMO | Admitting: Internal Medicine

## 2019-09-16 ENCOUNTER — Encounter: Payer: Self-pay | Admitting: Internal Medicine

## 2019-09-16 VITALS — BP 140/64 | HR 63 | Temp 98.1°F | Resp 13 | Ht 71.0 in | Wt 146.0 lb

## 2019-09-16 DIAGNOSIS — J449 Chronic obstructive pulmonary disease, unspecified: Secondary | ICD-10-CM | POA: Diagnosis not present

## 2019-09-16 DIAGNOSIS — K264 Chronic or unspecified duodenal ulcer with hemorrhage: Secondary | ICD-10-CM

## 2019-09-16 DIAGNOSIS — K315 Obstruction of duodenum: Secondary | ICD-10-CM

## 2019-09-16 DIAGNOSIS — R1310 Dysphagia, unspecified: Secondary | ICD-10-CM | POA: Diagnosis not present

## 2019-09-16 LAB — CBC
HCT: 32.4 % — ABNORMAL LOW (ref 39.0–52.0)
Hemoglobin: 11.1 g/dL — ABNORMAL LOW (ref 13.0–17.0)
MCHC: 34.1 g/dL (ref 30.0–36.0)
MCV: 89 fl (ref 78.0–100.0)
Platelets: 268 10*3/uL (ref 150.0–400.0)
RBC: 3.64 Mil/uL — ABNORMAL LOW (ref 4.22–5.81)
RDW: 16 % — ABNORMAL HIGH (ref 11.5–15.5)
WBC: 5.9 10*3/uL (ref 4.0–10.5)

## 2019-09-16 MED ORDER — PANTOPRAZOLE SODIUM 40 MG PO TBEC: 40.0000 mg | DELAYED_RELEASE_TABLET | Freq: Two times a day (BID) | ORAL | 5 refills | Status: DC

## 2019-09-16 MED ORDER — SODIUM CHLORIDE 0.9 % IV SOLN: 500.0000 mL | Freq: Once | INTRAVENOUS | Status: DC

## 2019-09-16 NOTE — Progress Notes (Signed)
Robinul 0.1 mg IV given due large amount of secretions upon assessment.  MD made aware, vss 

## 2019-09-16 NOTE — Progress Notes (Signed)
Called to room to assist during endoscopic procedure.  Patient ID and intended procedure confirmed with present staff. Received instructions for my participation in the procedure from the performing physician.  

## 2019-09-16 NOTE — Progress Notes (Signed)
Report given to PACU, vss 

## 2019-09-16 NOTE — Op Note (Signed)
[facility] Patient Name: Michael Bender Procedure Date: 09/16/2019 10:31 AM MRN: [id number] Endoscopist: Gatha Mayer , MD Age: 74 Referring MD:  Date of Birth: 04/20/1945 Gender: Male Account #: [id number] Procedure:                Upper GI endoscopy Indications:              Acute post hemorrhagic anemia, and duodenal ulcer                            with hemorrhage and stricture Medicines:                Propofol per Anesthesia, Monitored Anesthesia Care Procedure:                Pre-Anesthesia Assessment:                           - Prior to the procedure, a History and Physical                            was performed, and patient medications and                            allergies were reviewed. The patient's tolerance of                            previous anesthesia was also reviewed. The risks                            and benefits of the procedure and the sedation                            options and risks were discussed with the patient.                            All questions were answered, and informed consent                            was obtained. Prior Anticoagulants: The patient has                            taken no previous anticoagulant or antiplatelet                            agents. ASA Grade Assessment: III - A patient with                            severe systemic disease. After reviewing the risks                            and benefits, the patient was deemed in                            satisfactory condition to undergo the procedure.  After obtaining informed consent, the endoscope was                            passed under direct vision. Throughout the                            procedure, the patient's blood pressure, pulse, and                            oxygen saturations were monitored continuously. The                            Endoscope was introduced through the mouth, and                             advanced to the second part of duodenum. The upper                            GI endoscopy was accomplished without difficulty.                            The patient tolerated the procedure well. Scope In: Scope Out: Findings:                 One non-bleeding cratered duodenal ulcer with no                            stigmata of bleeding was found in the duodenal                            bulb. The lesion was 5 mm in largest dimension.                            Biopsies were taken with a cold forceps for                            histology. Verification of patient identification                            for the specimen was done. Estimated blood loss was                            minimal.                           An acquired benign-appearing, intrinsic moderate                            stenosis was found in the duodenal bulb.                           The exam was otherwise without abnormality.                           The cardia and  gastric fundus were normal on                            retroflexion. Complications:            No immediate complications. Estimated Blood Loss:     Estimated blood loss was minimal. Impression:               - Non-bleeding duodenal ulcer with no stigmata of                            bleeding. Biopsied.                           - Acquired duodenal stenosis. I COULD GET SCOPE                            INTO THIS AND SEE D2 BUT DID NOT COMPLETELY TRAVERSE                           - The examination was otherwise normal. Recommendation:           - Patient has a contact number available for                            emergencies. The signs and symptoms of potential                            delayed complications were discussed with the                            patient. Return to normal activities tomorrow.                            Written discharge instructions were provided to the                            patient.                            - Resume previous diet.                           - Continue present medications.                           - Await pathology results.                           - TO LAB FOR CBC TODAY                           REFILL AND RESTART PANTOPRAZOLE BID - HE HAS                            STOPPED IT/RUN OUT Gatha Mayer, MD 09/16/2019 11:06:31 AM This report has been signed electronically.

## 2019-09-16 NOTE — Patient Instructions (Addendum)
The ulcer is not healed - looks better but need to stay on medication.  I sent another pantoprazole prescription to pharmacy - please restart that at twice a day.  I am also checking blood count today - lab in basement today.  I will contact you with pathology results and follow-up plans.  I appreciate the opportunity to care for you. Gatha Mayer, MD, FACG   YOU HAD AN ENDOSCOPIC PROCEDURE TODAY AT [facility]:   Refer to the procedure report that was given to you for any specific questions about what was found during the examination.  If the procedure report does not answer your questions, please call your gastroenterologist to clarify.  If you requested that your care partner not be given the details of your procedure findings, then the procedure report has been included in a sealed envelope for you to review at your convenience later.  YOU SHOULD EXPECT: Some feelings of bloating in the abdomen. Passage of more gas than usual.  Walking can help get rid of the air that was put into your GI tract during the procedure and reduce the bloating. If you had a lower endoscopy (such as a colonoscopy or flexible sigmoidoscopy) you may notice spotting of blood in your stool or on the toilet paper. If you underwent a bowel prep for your procedure, you may not have a normal bowel movement for a few days.  Please Note:  You might notice some irritation and congestion in your nose or some drainage.  This is from the oxygen used during your procedure.  There is no need for concern and it should clear up in a day or so.  SYMPTOMS TO REPORT IMMEDIATELY:    Following upper endoscopy (EGD)  Vomiting of blood or coffee ground material  New chest pain or pain under the shoulder blades  Painful or persistently difficult swallowing  New shortness of breath  Fever of 100F or higher  Black, tarry-looking stools  For urgent or emergent issues, a gastroenterologist can be reached at any  hour by calling [phone]. Do not use MyChart messaging for urgent concerns.    DIET:  We do recommend a small meal at first, but then you may proceed to your regular diet.  Drink plenty of fluids but you should avoid alcoholic beverages for 24 hours.  ACTIVITY:  You should plan to take it easy for the rest of today and you should NOT DRIVE or use heavy machinery until tomorrow (because of the sedation medicines used during the test).    FOLLOW UP: Our staff will call the number listed on your records 48-72 hours following your procedure to check on you and address any questions or concerns that you may have regarding the information given to you following your procedure. If we do not reach you, we will leave a message.  We will attempt to reach you two times.  During this call, we will ask if you have developed any symptoms of COVID 19. If you develop any symptoms (ie: fever, flu-like symptoms, shortness of breath, cough etc.) before then, please call [phone].  If you test positive for Covid 19 in the 2 weeks post procedure, please call and report this information to [place].    If any biopsies were taken you will be contacted by phone or by letter within the next 1-3 weeks.  Please call us at [phone] if you have not heard about the biopsies in 3 weeks.  SIGNATURES/CONFIDENTIALITY: You and/or your care partner have signed paperwork which will be entered into your electronic medical record.  These signatures attest to the fact that that the information above on your After Visit Summary has been reviewed and is understood.  Full responsibility of the confidentiality of this discharge information lies with you and/or your care-partner.

## 2019-09-18 ENCOUNTER — Other Ambulatory Visit: Payer: Self-pay

## 2019-09-18 ENCOUNTER — Telehealth: Payer: Self-pay

## 2019-09-18 DIAGNOSIS — D500 Iron deficiency anemia secondary to blood loss (chronic): Secondary | ICD-10-CM

## 2019-09-18 NOTE — Telephone Encounter (Signed)
°  Follow up Call-  Call back number 09/16/2019 03/08/2019  Post procedure Call Back phone  # [phone]  Permission to leave phone message Yes Yes  Some recent data might be hidden     Patient questions:  Do you have a fever, pain , or abdominal swelling? No. Pain Score  0 *  Have you tolerated food without any problems? Yes.    Have you been able to return to your normal activities? Yes.    Do you have any questions about your discharge instructions: Diet   No. Medications  No. Follow up visit  No.  Do you have questions or concerns about your Care? No.  Actions: * If pain score is 4 or above: No action needed, pain <4.  1. Have you developed a fever since your procedure? no  2.   Have you had an respiratory symptoms (SOB or cough) since your procedure? no  3.   Have you tested positive for COVID 19 since your procedure no  4.   Have you had any family members/close contacts diagnosed with the COVID 19 since your procedure?  no   If yes to any of these questions please route to Joylene John, RN and Joella Prince, RN

## 2019-10-25 DIAGNOSIS — G4700 Insomnia, unspecified: Secondary | ICD-10-CM | POA: Diagnosis not present

## 2019-10-25 DIAGNOSIS — G8929 Other chronic pain: Secondary | ICD-10-CM | POA: Diagnosis not present

## 2019-10-25 DIAGNOSIS — J439 Emphysema, unspecified: Secondary | ICD-10-CM | POA: Diagnosis not present

## 2019-10-25 DIAGNOSIS — R69 Illness, unspecified: Secondary | ICD-10-CM | POA: Diagnosis not present

## 2019-10-25 DIAGNOSIS — E785 Hyperlipidemia, unspecified: Secondary | ICD-10-CM | POA: Diagnosis not present

## 2019-11-13 ENCOUNTER — Ambulatory Visit: Payer: Medicare HMO | Admitting: Internal Medicine

## 2019-11-13 ENCOUNTER — Other Ambulatory Visit (INDEPENDENT_AMBULATORY_CARE_PROVIDER_SITE_OTHER): Payer: Medicare HMO

## 2019-11-13 ENCOUNTER — Encounter: Payer: Self-pay | Admitting: Internal Medicine

## 2019-11-13 VITALS — BP 178/80 | HR 73 | Ht 71.0 in | Wt 154.0 lb

## 2019-11-13 DIAGNOSIS — K315 Obstruction of duodenum: Secondary | ICD-10-CM | POA: Diagnosis not present

## 2019-11-13 DIAGNOSIS — D500 Iron deficiency anemia secondary to blood loss (chronic): Secondary | ICD-10-CM

## 2019-11-13 DIAGNOSIS — K264 Chronic or unspecified duodenal ulcer with hemorrhage: Secondary | ICD-10-CM

## 2019-11-13 LAB — FERRITIN: Ferritin: 26.6 ng/mL (ref 22.0–322.0)

## 2019-11-13 LAB — CBC
HCT: 36.8 % — ABNORMAL LOW (ref 39.0–52.0)
Hemoglobin: 12.4 g/dL — ABNORMAL LOW (ref 13.0–17.0)
MCHC: 33.7 g/dL (ref 30.0–36.0)
MCV: 90.6 fl (ref 78.0–100.0)
Platelets: 293 10*3/uL (ref 150.0–400.0)
RBC: 4.06 Mil/uL — ABNORMAL LOW (ref 4.22–5.81)
RDW: 14.9 % (ref 11.5–15.5)
WBC: 7.5 10*3/uL (ref 4.0–10.5)

## 2019-11-13 MED ORDER — PANTOPRAZOLE SODIUM 40 MG PO TBEC: 40.0000 mg | DELAYED_RELEASE_TABLET | Freq: Every day | ORAL | 3 refills | Status: DC

## 2019-11-13 NOTE — Patient Instructions (Signed)
Your provider has requested that you go to the basement level for lab work before leaving today. Press "B" on the elevator. The lab is located at the first door on the left as you exit the elevator.  Due to recent changes in healthcare laws, you may see the results of your imaging and laboratory studies on MyChart before your provider has had a chance to review them.  We understand that in some cases there may be results that are confusing or concerning to you. Not all laboratory results come back in the same time frame and the provider may be waiting for multiple results in order to interpret others.  Please give [place] 48 hours in order for your provider to thoroughly review all the results before contacting the office for clarification of your results.   Normal BMI (Body Mass Index- based on height and weight) is between 23 and 30. Your BMI today is Body mass index is 21.48 kg/m. Marland Kitchen Please consider follow up  regarding your BMI with your Primary Care Provider.  Take your pantoprazole daily. Call the pharmacy when you need it refilled. Don't stop it.  I appreciate the opportunity to care for you. Silvano Rusk, MD, [facility]

## 2019-11-13 NOTE — Progress Notes (Signed)
Michael Bender 73 y.o. 05-20-1945 [id number]  Assessment & Plan:   Encounter Diagnoses  Name Primary?  . Duodenal ulcer with hemorrhage and obstruction Yes  . Iron deficiency anemia due to chronic blood loss     Stay on PPI reduce pantoprazole to 40 mg daily.  Do not stop unless there are other appropriate medical reasons to do so.  Ferritin and CBC today  Since he is doing well and is not symptomatic and his duodenal biopsies are benign I do not think repeat EGD is necessary at this time.   Lab Results  Component Value Date   FERRITIN 26.6 11/13/2019   Lab Results  Component Value Date   WBC 7.5 11/13/2019   HGB 12.4 (L) 11/13/2019   HCT 36.8 (L) 11/13/2019   MCV 90.6 11/13/2019   PLT 293.0 11/13/2019   Labs did return and I will have him continue his iron therapy and he should have follow-up blood counts through primary care to document return to normal/improved iron levels before discontinuing iron.  Any time 3 to 6 months from now would seem to make sense.  I will make a recommendation that he follow-up with primary care in that time range.  I appreciate the opportunity to care for this patient. CC: Michael Bender, Michael G, MD   Subjective:   Chief Complaint: Follow-up of duodenal ulcer  HPI Michael Bender is a 74 year old white man with a history of a duodenal ulcer with hemorrhage and stenosis and obstruction in the duodenum originally diagnosed in the hospital in June 2021, follow-up exam August 2021.  In June he was using NSAIDs he had some reflux esophagitis gastritis and duodenal ulcers and some stricturing.  In August at EGD on the 16th of that month, he had a 5 mm duodenal ulcer with stricturing inflammatory changes biopsies were negative for malignancy.  He has been on iron therapy and twice daily PPI, at the time of the endoscopy in August he had stopped his PPI and was not on it for a few weeks or more.   He denies any melena, he is eating well no vomiting no  regurgitation no epigastric pain.    Allergies  Allergen Reactions  . Nubain [Nalbuphine Hcl]     Muscle contraction   Current Meds  Medication Sig  . acetaminophen (TYLENOL) 325 MG tablet Take 2 tablets (650 mg total) by mouth every 6 (six) hours as needed for mild pain (or Fever >/= 101).  . ADVAIR DISKUS 250-50 MCG/DOSE AEPB INHALE 1 PUFF BY MOUTH TWICE A DAY  . amitriptyline (ELAVIL) 50 MG tablet Take 50 mg by mouth in the morning, at noon, and at bedtime.   Marland Kitchen amLODipine (NORVASC) 10 MG tablet Take 1 tablet (10 mg total) by mouth daily.  Marland Kitchen atorvastatin (LIPITOR) 40 MG tablet Take 1 tablet (40 mg total) by mouth daily.  . baclofen (LIORESAL) 10 MG tablet Take 10 mg by mouth 2 (two) times daily.   . mupirocin ointment (BACTROBAN) 2 % APPLY TO AFFECTED AREA 3 TIMES A DAY  . oxycodone (ROXICODONE) 30 MG immediate release tablet Take 30 mg by mouth 3 (three) times daily.   . pantoprazole (PROTONIX) 40 MG tablet Take 1 tablet (40 mg total) by mouth 2 (two) times daily.  . polyethylene glycol (MIRALAX / GLYCOLAX) 17 Bender packet Take 34 Bender by mouth daily.   . sucralfate (CARAFATE) 1 GM/10ML suspension Take 10 mLs (1 Bender total) by mouth 4 (four) times daily -  with  meals and at bedtime.   Past Medical History:  Diagnosis Date  . Arthritis    Back  . Blood transfusion    as a child  . Chronic back pain   . COPD (chronic obstructive pulmonary disease) ([place])   . Headache(784.0)    last one 6 months ago  . Hemorrhoids, internal   . HLD (hyperlipidemia)   . Hypertension   . Neck rigidity    post cervical fusion  . Nocturia   . Pneumonia   . Prostate disease    Past Surgical History:  Procedure Laterality Date  . BIOPSY  07/19/2019   Procedure: BIOPSY;  Surgeon: Lavena Bullion, DO;  Location: [facility];  Service: Gastroenterology;;  . [place]   C2/C 3  four surgeries  . COLONOSCOPY    . ESOPHAGOGASTRODUODENOSCOPY  07/20/2011   Procedure: ESOPHAGOGASTRODUODENOSCOPY  (EGD);  Surgeon: Gatha Mayer, MD;  Location: [facility];  Service: Endoscopy;  Laterality: N/A;  . ESOPHAGOGASTRODUODENOSCOPY (EGD) WITH PROPOFOL N/A 07/19/2019   Procedure: ESOPHAGOGASTRODUODENOSCOPY (EGD) WITH PROPOFOL;  Surgeon: Lavena Bullion, DO;  Location: [facility];  Service: Gastroenterology;  Laterality: N/A;  . SAVORY DILATION  07/20/2011   Procedure: SAVORY DILATION;  Surgeon: Gatha Mayer, MD;  Location: [facility];  Service: Endoscopy;  Laterality: N/A;  . 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  He drinks about a liter of caffeinated drinks a day past smoker no drug use no alcohol.   family history includes Heart attack (age of onset: 64) in his mother; Heart disease in his mother; Pancreatic cancer in his brother; Pancreatic cancer (age of onset: 22) in his father.   Review of Systems   Objective:   Physical Exam BP (!) 178/80   Pulse 73   Ht 5\' 11"  (1.803 m)   Wt 154 lb (69.9 kg)   SpO2 94%   BMI 21.48 kg/m

## 2019-11-21 ENCOUNTER — Other Ambulatory Visit: Payer: Self-pay | Admitting: Family Medicine

## 2019-12-07 ENCOUNTER — Other Ambulatory Visit: Payer: Self-pay | Admitting: Family Medicine

## 2019-12-07 DIAGNOSIS — K5909 Other constipation: Secondary | ICD-10-CM

## 2020-01-22 ENCOUNTER — Telehealth: Payer: Self-pay | Admitting: Family Medicine

## 2020-01-22 NOTE — Telephone Encounter (Signed)
Left message for patient to call back and schedule Medicare Annual Wellness Visit (AWV) either virtually or in office.   Last AWV no information please schedule at anytime with [facility] Nurse Health Advisor 1 or 2   This should be a 45 minute visit. 

## 2020-03-30 ENCOUNTER — Other Ambulatory Visit: Payer: Self-pay | Admitting: Internal Medicine

## 2020-04-10 ENCOUNTER — Telehealth: Payer: Self-pay | Admitting: Family Medicine

## 2020-04-10 NOTE — Telephone Encounter (Signed)
Tried calling patient to  schedule Medicare Annual Wellness Visit (AWV) either virtually or in office.  No answer   Last AWV no information  please schedule at anytime with [facility] Nurse Health Advisor 1 or 2   This should be a 45 minute visit.

## 2020-07-08 ENCOUNTER — Ambulatory Visit (INDEPENDENT_AMBULATORY_CARE_PROVIDER_SITE_OTHER): Payer: Medicare HMO

## 2020-07-08 ENCOUNTER — Other Ambulatory Visit: Payer: Self-pay

## 2020-07-08 DIAGNOSIS — Z0000 Encounter for general adult medical examination without abnormal findings: Secondary | ICD-10-CM | POA: Diagnosis not present

## 2020-07-08 NOTE — Patient Instructions (Signed)
Michael Bender , Thank you for taking time to come for your Medicare Wellness Visit. I appreciate your ongoing commitment to your health goals. Please review the following plan we discussed and let me know if I can assist you in the future.   Screening recommendations/referrals: Colonoscopy: Done 03/08/19 Recommended yearly ophthalmology/optometry visit for glaucoma screening and checkup Recommended yearly dental visit for hygiene and checkup  Vaccinations: Influenza vaccine: Due  Pneumococcal vaccine: Due and discussed Tdap vaccine: Up to date Shingles vaccine: Shingrix discussed. Please contact your pharmacy for coverage information.    Covid-19: Declined and discussed  Advanced directives: Please bring a copy of your health care power of attorney and living will to the office at your convenience.  Conditions/risks identified: None at this time  Next appointment: Follow up in one year for your annual wellness visit.   Preventive Care 40 Years and Older, Male Preventive care refers to lifestyle choices and visits with your health care provider that can promote health and wellness. What does preventive care include?  A yearly physical exam. This is also called an annual well check.  Dental exams once or twice a year.  Routine eye exams. Ask your health care provider how often you should have your eyes checked.  Personal lifestyle choices, including:  Daily care of your teeth and gums.  Regular physical activity.  Eating a healthy diet.  Avoiding tobacco and drug use.  Limiting alcohol use.  Practicing safe sex.  Taking low doses of aspirin every day.  Taking vitamin and mineral supplements as recommended by your health care provider. What happens during an annual well check? The services and screenings done by your health care provider during your annual well check will depend on your age, overall health, lifestyle risk factors, and family history of disease. Counseling   Your health care provider may ask you questions about your:  Alcohol use.  Tobacco use.  Drug use.  Emotional well-being.  Home and relationship well-being.  Sexual activity.  Eating habits.  History of falls.  Memory and ability to understand (cognition).  Work and work Statistician. Screening  You may have the following tests or measurements:  Height, weight, and BMI.  Blood pressure.  Lipid and cholesterol levels. These may be checked every 5 years, or more frequently if you are over 63 years old.  Skin check.  Lung cancer screening. You may have this screening every year starting at age 47 if you have a 30-pack-year history of smoking and currently smoke or have quit within the past 15 years.  Fecal occult blood test (FOBT) of the stool. You may have this test every year starting at age 27.  Flexible sigmoidoscopy or colonoscopy. You may have a sigmoidoscopy every 5 years or a colonoscopy every 10 years starting at age 42.  Prostate cancer screening. Recommendations will vary depending on your family history and other risks.  Hepatitis C blood test.  Hepatitis B blood test.  Sexually transmitted disease (STD) testing.  Diabetes screening. This is done by checking your blood sugar (glucose) after you have not eaten for a while (fasting). You may have this done every 1-3 years.  Abdominal aortic aneurysm (AAA) screening. You may need this if you are a current or former smoker.  Osteoporosis. You may be screened starting at age 67 if you are at high risk. Talk with your health care provider about your test results, treatment options, and if necessary, the need for more tests. Vaccines  Your health care  provider may recommend certain vaccines, such as:  Influenza vaccine. This is recommended every year.  Tetanus, diphtheria, and acellular pertussis (Tdap, Td) vaccine. You may need a Td booster every 10 years.  Zoster vaccine. You may need this after age  8.  Pneumococcal 13-valent conjugate (PCV13) vaccine. One dose is recommended after age 63.  Pneumococcal polysaccharide (PPSV23) vaccine. One dose is recommended after age 31. Talk to your health care provider about which screenings and vaccines you need and how often you need them. This information is not intended to replace advice given to you by your health care provider. Make sure you discuss any questions you have with your health care provider. Document Released: 02/13/2015 Document Revised: 10/07/2015 Document Reviewed: 11/18/2014 Elsevier Interactive Patient Education  2017 [place] Prevention in the Home Falls can cause injuries. They can happen to people of all ages. There are many things you can do to make your home safe and to help prevent falls. What can I do on the outside of my home?  Regularly fix the edges of walkways and driveways and fix any cracks.  Remove anything that might make you trip as you walk through a door, such as a raised step or threshold.  Trim any bushes or trees on the path to your home.  Use bright outdoor lighting.  Clear any walking paths of anything that might make someone trip, such as rocks or tools.  Regularly check to see if handrails are loose or broken. Make sure that both sides of any steps have handrails.  Any raised decks and porches should have guardrails on the edges.  Have any leaves, snow, or ice cleared regularly.  Use sand or salt on walking paths during winter.  Clean up any spills in your garage right away. This includes oil or grease spills. What can I do in the bathroom?  Use night lights.  Install grab bars by the toilet and in the tub and shower. Do not use towel bars as grab bars.  Use non-skid mats or decals in the tub or shower.  If you need to sit down in the shower, use a plastic, non-slip stool.  Keep the floor dry. Clean up any water that spills on the floor as soon as it happens.  Remove  soap buildup in the tub or shower regularly.  Attach bath mats securely with double-sided non-slip rug tape.  Do not have throw rugs and other things on the floor that can make you trip. What can I do in the bedroom?  Use night lights.  Make sure that you have a light by your bed that is easy to reach.  Do not use any sheets or blankets that are too big for your bed. They should not hang down onto the floor.  Have a firm chair that has side arms. You can use this for support while you get dressed.  Do not have throw rugs and other things on the floor that can make you trip. What can I do in the kitchen?  Clean up any spills right away.  Avoid walking on wet floors.  Keep items that you use a lot in easy-to-reach places.  If you need to reach something above you, use a strong step stool that has a grab bar.  Keep electrical cords out of the way.  Do not use floor polish or wax that makes floors slippery. If you must use wax, use non-skid floor wax.  Do not have throw  rugs and other things on the floor that can make you trip. What can I do with my stairs?  Do not leave any items on the stairs.  Make sure that there are handrails on both sides of the stairs and use them. Fix handrails that are broken or loose. Make sure that handrails are as long as the stairways.  Check any carpeting to make sure that it is firmly attached to the stairs. Fix any carpet that is loose or worn.  Avoid having throw rugs at the top or bottom of the stairs. If you do have throw rugs, attach them to the floor with carpet tape.  Make sure that you have a light switch at the top of the stairs and the bottom of the stairs. If you do not have them, ask someone to add them for you. What else can I do to help prevent falls?  Wear shoes that:  Do not have high heels.  Have rubber bottoms.  Are comfortable and fit you well.  Are closed at the toe. Do not wear sandals.  If you use a  stepladder:  Make sure that it is fully opened. Do not climb a closed stepladder.  Make sure that both sides of the stepladder are locked into place.  Ask someone to hold it for you, if possible.  Clearly mark and make sure that you can see:  Any grab bars or handrails.  First and last steps.  Where the edge of each step is.  Use tools that help you move around (mobility aids) if they are needed. These include:  Canes.  Walkers.  Scooters.  Crutches.  Turn on the lights when you go into a dark area. Replace any light bulbs as soon as they burn out.  Set up your furniture so you have a clear path. Avoid moving your furniture around.  If any of your floors are uneven, fix them.  If there are any pets around you, be aware of where they are.  Review your medicines with your doctor. Some medicines can make you feel dizzy. This can increase your chance of falling. Ask your doctor what other things that you can do to help prevent falls. This information is not intended to replace advice given to you by your health care provider. Make sure you discuss any questions you have with your health care provider. Document Released: 11/13/2008 Document Revised: 06/25/2015 Document Reviewed: 02/21/2014 Elsevier Interactive Patient Education  2017 [employer].

## 2020-07-08 NOTE — Progress Notes (Signed)
Virtual Visit via Telephone Note  I connected with  [name] on 07/08/20 at 11:00 AM EDT by telephone and verified that I am speaking with the correct person using two identifiers.  Medicare Annual Wellness visit completed telephonically due to Covid-19 pandemic.   Persons participating in this call: This Health Coach and this patient.   Location: Patient: Home Provider: Office   I discussed the limitations, risks, security and privacy concerns of performing an evaluation and management service by telephone and the availability of in person appointments. The patient expressed understanding and agreed to proceed.  Unable to perform video visit due to video visit attempted and failed and/or patient does not have video capability.   Some vital signs may be absent or patient reported.   Willette Brace, LPN    Subjective:   MCCALL LOMAX is a 75 y.o. male who presents for an Initial Medicare Annual Wellness Visit.  Review of Systems     Cardiac Risk Factors include: advanced age (>8men, >26 women);hypertension;dyslipidemia;male gender     Objective:    There were no vitals filed for this visit. There is no height or weight on file to calculate BMI.  Advanced Directives 07/08/2020 07/17/2019 07/17/2019 02/19/2019 11/16/2018 07/20/2011 03/12/2011  Does Patient Have a Medical Advance Directive? Yes No No No No Patient does not have advance directive Patient does not have advance directive  Type of Advance Directive Living will - - - - - -  Would patient like information on creating a medical advance directive? - No - Patient declined No - Patient declined - No - Patient declined - -  Pre-existing out of facility DNR order (yellow form or pink MOST form) - - - - - - No    Current Medications (verified) Outpatient Encounter Medications as of 07/08/2020  Medication Sig  . acetaminophen (TYLENOL) 325 MG tablet Take 2 tablets (650 mg total) by mouth every 6 (six) hours as needed  for mild pain (or Fever >/= 101).  Marland Kitchen amitriptyline (ELAVIL) 50 MG tablet Take 50 mg by mouth in the morning, at noon, and at bedtime.   . baclofen (LIORESAL) 10 MG tablet Take 10 mg by mouth 2 (two) times daily.   . mupirocin ointment (BACTROBAN) 2 % APPLY TO AFFECTED AREA 3 TIMES A DAY  . oxycodone (ROXICODONE) 30 MG immediate release tablet Take 30 mg by mouth 3 (three) times daily.   . polyethylene glycol (MIRALAX / GLYCOLAX) 17 g packet Take 34 g by mouth daily.   Marland Kitchen ADVAIR DISKUS 250-50 MCG/DOSE AEPB INHALE 1 PUFF BY MOUTH TWICE A DAY (Patient not taking: Reported on 07/08/2020)  . amLODipine (NORVASC) 10 MG tablet Take 1 tablet (10 mg total) by mouth daily. (Patient not taking: Reported on 07/08/2020)  . atorvastatin (LIPITOR) 40 MG tablet TAKE 1 TABLET BY MOUTH EVERY DAY (Patient not taking: Reported on 07/08/2020)  . pantoprazole (PROTONIX) 40 MG tablet TAKE 1 TABLET BY MOUTH TWICE A DAY (Patient not taking: Reported on 07/08/2020)  . sucralfate (CARAFATE) 1 GM/10ML suspension Take 10 mLs (1 g total) by mouth 4 (four) times daily -  with meals and at bedtime. (Patient not taking: Reported on 07/08/2020)   No facility-administered encounter medications on file as of 07/08/2020.    Allergies (verified) Nubain [nalbuphine hcl]   History: Past Medical History:  Diagnosis Date  . Arthritis    Back  . Blood transfusion    as a child  . Chronic back pain   .  COPD (chronic obstructive pulmonary disease) ([place])   . Headache(784.0)    last one 6 months ago  . Hemorrhoids, internal   . HLD (hyperlipidemia)   . Hypertension   . Neck rigidity    post cervical fusion  . Nocturia   . Pneumonia   . Prostate disease    Past Surgical History:  Procedure Laterality Date  . BIOPSY  07/19/2019   Procedure: BIOPSY;  Surgeon: Lavena Bullion, DO;  Location: [facility];  Service: Gastroenterology;;  . [place]   C2/C 3  four surgeries  . COLONOSCOPY    . ESOPHAGOGASTRODUODENOSCOPY   07/20/2011   Procedure: ESOPHAGOGASTRODUODENOSCOPY (EGD);  Surgeon: Gatha Mayer, MD;  Location: [facility];  Service: Endoscopy;  Laterality: N/A;  . ESOPHAGOGASTRODUODENOSCOPY (EGD) WITH PROPOFOL N/A 07/19/2019   Procedure: ESOPHAGOGASTRODUODENOSCOPY (EGD) WITH PROPOFOL;  Surgeon: Lavena Bullion, DO;  Location: [facility];  Service: Gastroenterology;  Laterality: N/A;  . SAVORY DILATION  07/20/2011   Procedure: SAVORY DILATION;  Surgeon: Gatha Mayer, MD;  Location: [facility];  Service: Endoscopy;  Laterality: N/A;  . SPINAL FUSION  05/06/11   Family History  Problem Relation Age of Onset  . Heart disease Mother   . Heart attack Mother 28  . Pancreatic cancer Father 27  . Pancreatic cancer Brother   . Anesthesia problems Neg Hx   . Colon cancer Neg Hx   . Liver cancer Neg Hx   . Stomach cancer Neg Hx   . Esophageal cancer Neg Hx   . Rectal cancer Neg Hx    Social History   Socioeconomic History  . Marital status: Divorced    Spouse name: Not on file  . Number of children: 2  . Years of education: Not on file  . Highest education level: Not on file  Occupational History  . Occupation: Disabled  Tobacco Use  . Smoking status: Former Smoker    Years: 40.00    Types: Cigarettes    Quit date: 12/12/2018    Years since quitting: 1.5  . Smokeless tobacco: Never Used  Vaping Use  . Vaping Use: Never used  Substance and Sexual Activity  . Alcohol use: Not Currently    Alcohol/week: 2.0 standard drinks    Types: 2 Shots of liquor per week  . Drug use: No  . Sexual activity: Not on file  Other Topics Concern  . 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  He drinks about a liter of caffeinated drinks a day past smoker no drug use no alcohol.   Social Determinants of Health   Financial Resource Strain: Low Risk   . Difficulty of Paying Living  Expenses: Not hard at all  Food Insecurity: No Food Insecurity  . Worried About Charity fundraiser in the Last Year: Never true  . Ran Out of Food in the Last Year: Never true  Transportation Needs: No Transportation Needs  . Lack of Transportation (Medical): No  . Lack of Transportation (Non-Medical): No  Physical Activity: Sufficiently Active  . Days of Exercise per Week: 5 days  . Minutes of Exercise per Session: 40 min  Stress: No Stress Concern Present  . Feeling of Stress : Not at all  Social Connections: Socially Isolated  . Frequency of Communication with Friends and Family: Never  . Frequency  of Social Gatherings with Friends and Family: Never  . Attends Religious Services: Never  . Active Member of Clubs or Organizations: No  . Attends Archivist Meetings: Never  . Marital Status: Divorced    Tobacco Counseling Counseling given: Not Answered   Clinical Intake:  Pre-visit preparation completed: Yes  Pain : 0-10 Pain Type: Chronic pain Pain Location: Leg Pain Orientation: Left Pain Descriptors / Indicators: Aching Pain Onset: 1 to 4 weeks ago Pain Frequency: Intermittent     BMI - recorded: 21.49 Nutritional Status: BMI of 19-24  Normal Nutritional Risks: None Diabetes: No  How often do you need to have someone help you when you read instructions, pamphlets, or other written materials from your doctor or pharmacy?: 1 - Never  Diabetic?No  Interpreter Needed?: No  Information entered by :: Charlott Rakes, LPN   Activities of Daily Living In your present state of health, do you have any difficulty performing the following activities: 07/08/2020 07/17/2019  Hearing? [place]? N -  Difficulty concentrating or making decisions? Y -  Comment at times -  Walking or climbing stairs? N -  Dressing or bathing? N -  Doing errands, shopping? N Y  Conservation officer, nature and eating ? N -  Using the Toilet? N -  In the past six months, have  you accidently leaked urine? N -  Do you have problems with loss of bowel control? N -  Managing your Medications? N -  Managing your Finances? N -  Housekeeping or managing your Housekeeping? N -  Some recent data might be hidden    Patient Care Team: [place], Betty G, MD as PCP - General (Family Medicine) Tat, Eustace Quail, DO as Consulting Physician (Neurology)  Indicate any recent Medical Services you may have received from other than [facility] providers in the past year (date may be approximate).     Assessment:   This is a routine wellness examination for [place].  Hearing/Vision screen  Hearing Screening   125Hz  250Hz  500Hz  1000Hz  2000Hz  3000Hz  4000Hz  6000Hz  8000Hz   Right ear:           Left ear:           Comments: Pt very HOH  Vision Screening Comments: Pt declined eye care   Dietary issues and exercise activities discussed: Current Exercise Habits: Home exercise routine, Type of exercise: walking, Time (Minutes): 45, Frequency (Times/Week): 5, Weekly Exercise (Minutes/Week): 225  Goals Addressed            This Visit's Progress   . Patient Stated       None at this time      Depression Screen PHQ 2/9 Scores 07/08/2020 04/09/2014  PHQ - 2 Score 0 0    Fall Risk Fall Risk  07/08/2020 02/19/2019 04/09/2014  Falls in the past year? 0 0 No  Number falls in past yr: 0 0 -  Injury with Fall? 0 0 -  Risk for fall due to : Impaired vision - -  Follow up Falls prevention discussed - -    FALL RISK PREVENTION PERTAINING TO THE HOME:  Any stairs in or around the home? No  If so, are there any without handrails? No  Home free of loose throw rugs in walkways, pet beds, electrical cords, etc? Yes  Adequate lighting in your home to reduce risk of falls? Yes   ASSISTIVE DEVICES UTILIZED TO PREVENT FALLS:  Life alert? No  Use of a  cane, walker or w/c? No  Grab bars in the bathroom? No  Shower chair or bench in shower? No  Elevated toilet seat or a handicapped toilet? No    TIMED UP AND GO:  Was the test performed? No     Cognitive Function:     6CIT Screen 07/08/2020  What Year? 0 points  What month? 0 points  What time? 0 points  Count back from 20 0 points  Months in reverse 4 points  Repeat phrase 4 points  Total Score 8    Immunizations Immunization History  Administered Date(s) Administered  . Influenza, High Dose Seasonal PF 09/19/2018  . Pneumococcal Conjugate-13 04/09/2014, 12/25/2017  . Td 10/12/2004, 01/31/2013    TDAP status: Up to date  Flu Vaccine status: Due, Education has been provided regarding the importance of this vaccine. Advised may receive this vaccine at local pharmacy or Health Dept. Aware to provide a copy of the vaccination record if obtained from local pharmacy or Health Dept. Verbalized acceptance and understanding.  Pneumococcal vaccine status: Due, Education has been provided regarding the importance of this vaccine. Advised may receive this vaccine at local pharmacy or Health Dept. Aware to provide a copy of the vaccination record if obtained from local pharmacy or Health Dept. Verbalized acceptance and understanding.  Covid-19 vaccine status: Declined, Education has been provided regarding the importance of this vaccine but patient still declined. Advised may receive this vaccine at local pharmacy or Health Dept.or vaccine clinic. Aware to provide a copy of the vaccination record if obtained from local pharmacy or Health Dept. Verbalized acceptance and understanding.  Qualifies for Shingles Vaccine? Yes   Zostavax completed No   Shingrix Completed?: No.    Education has been provided regarding the importance of this vaccine. Patient has been advised to call insurance company to determine out of pocket expense if they have not yet received this vaccine. Advised may also receive vaccine at local pharmacy or Health Dept. Verbalized acceptance and understanding.  Screening Tests Health Maintenance  Topic Date Due   . COVID-19 Vaccine (1) Never done  . Hepatitis C Screening  Never done  . Zoster Vaccines- Shingrix (1 of 2) Never done  . Pneumococcal Vaccine 54-80 Years old (1 of 2 - PPSV23) Never done  . PNA vac Low Risk Adult (2 of 2 - PPSV23) 12/26/2018  . INFLUENZA VACCINE  08/31/2020  . TETANUS/TDAP  02/01/2023  . HPV VACCINES  Aged Out    Health Maintenance  Health Maintenance Due  Topic Date Due  . COVID-19 Vaccine (1) Never done  . Hepatitis C Screening  Never done  . Zoster Vaccines- Shingrix (1 of 2) Never done  . Pneumococcal Vaccine 69-24 Years old (1 of 2 - PPSV23) Never done  . PNA vac Low Risk Adult (2 of 2 - PPSV23) 12/26/2018    Colorectal cancer screening: No longer required. , not a candidate   Additional Screening:  Hepatitis C Screening: does qualify;   Vision Screening: Recommended annual ophthalmology exams for early detection of glaucoma and other disorders of the eye. Is the patient up to date with their annual eye exam?  No  Who is the provider or what is the name of the office in which the patient attends annual eye exams? Declined eye care at this time If pt is not established with a provider, would they like to be referred to a provider to establish care? No .   Dental Screening: Recommended annual dental exams for  proper oral hygiene  Community Resource Referral / Chronic Care Management: CRR required this visit?  No   CCM required this visit?  No      Plan:     I have personally reviewed and noted the following in the patient's chart:   . Medical and social history . Use of alcohol, tobacco or illicit drugs  . Current medications and supplements including opioid prescriptions. Patient is currently taking opioid prescriptions. Information provided to patient regarding non-opioid alternatives. Patient advised to discuss non-opioid treatment plan with their provider. . Functional ability and status . Nutritional status . Physical activity . Advanced  directives . List of other physicians . Hospitalizations, surgeries, and ER visits in previous 12 months . Vitals . Screenings to include cognitive, depression, and falls . Referrals and appointments  In addition, I have reviewed and discussed with patient certain preventive protocols, quality metrics, and best practice recommendations. A written personalized care plan for preventive services as well as general preventive health recommendations were provided to patient.     Willette Brace, LPN   08/06/4140   Nurse Notes: None

## 2020-07-11 IMAGING — MR MR HEAD WO/W CM
13 series · 48 of 48 positions shown · IV contrast (15ml Multihance)
Comparison: None.

CLINICAL DATA: Headache, chronic. Right facial numbness. New onset
of tremor.

EXAM:
MRI HEAD WITHOUT AND WITH CONTRAST
TECHNIQUE: Multiplanar, multiecho pulse sequences of the brain and surrounding
structures were obtained without and with intravenous contrast.
CONTRAST:  15mL MULTIHANCE GADOBENATE DIMEGLUMINE 529 MG/ML IV SOLN

[Series 3: t1_se_sag · sagittal · 5.0mm · 0.45mm/px · 1 of 23 slices shown]
[im 1/23]
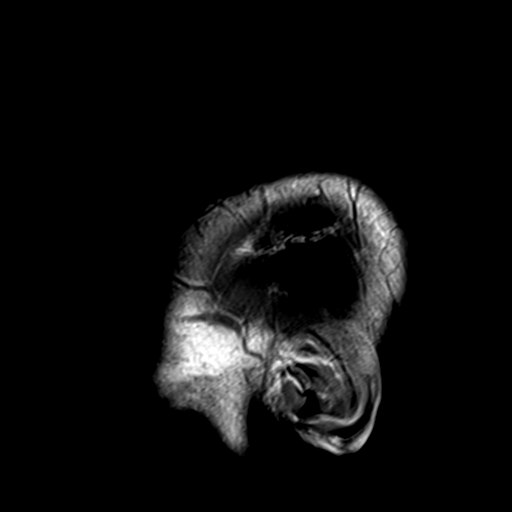

[Series 4: ep2d_diff_3 · axial · 3.0mm · 1.95mm/px · z∈[-36,+114]mm · 5 of 101 slices shown]
[im 1/101]
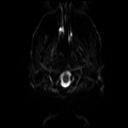
[im 26/101]
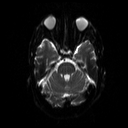
[im 51/101]
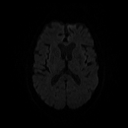
[im 76/101]
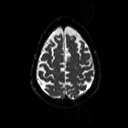
[im 101/101]
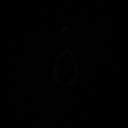

[Series 5: ep2d_diff_3_adc · axial · 3.0mm · 1.95mm/px · z∈[-36,+114]mm · 3 of 51 slices shown]
[im 1/51]
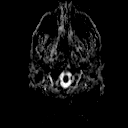
[im 26/51]
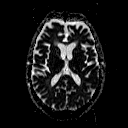
[im 51/51]
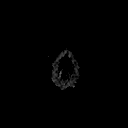

[Series 6: ep2d_diff_cor · coronal · 5.0mm · 1.77mm/px · 4 of 64 slices shown]
[im 1/64]
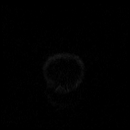
[im 22/64]
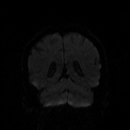
[im 43/64]
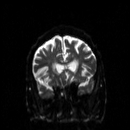
[im 64/64]
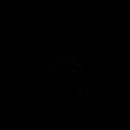

[Series 7: ep2d_diff_cor_adc · coronal · 5.0mm · 1.77mm/px · 2 of 32 slices shown]
[im 1/32]
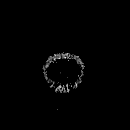
[im 32/32]
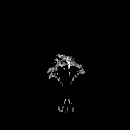

[Series 9: swi_images · axial · 2.0mm · 0.90mm/px · z∈[-39,+119]mm · 5 of 80 slices shown]
[im 1/80]
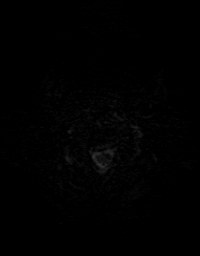
[im 20/80]
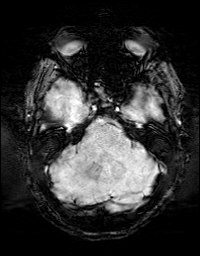
[im 40/80]
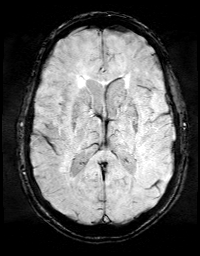
[im 60/80]
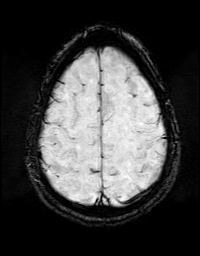
[im 80/80]
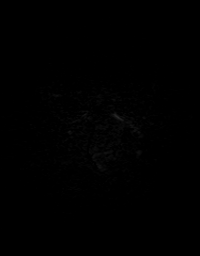

[Series 10: FLAIR · axial · 3.0mm · 0.43mm/px · z∈[-39,+117]mm · 2 of 27 slices shown (1 of 2)]
[im 1/27]
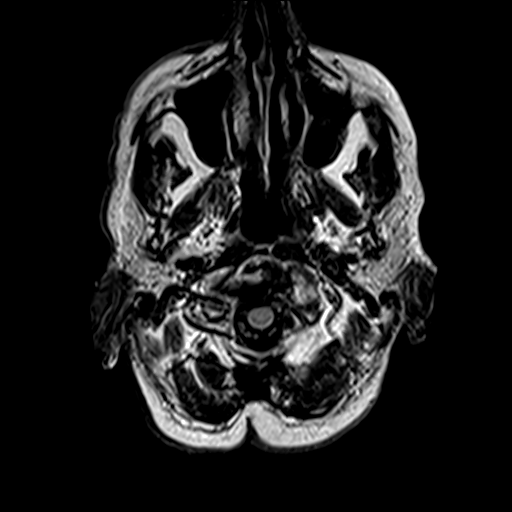
[im 27/27]
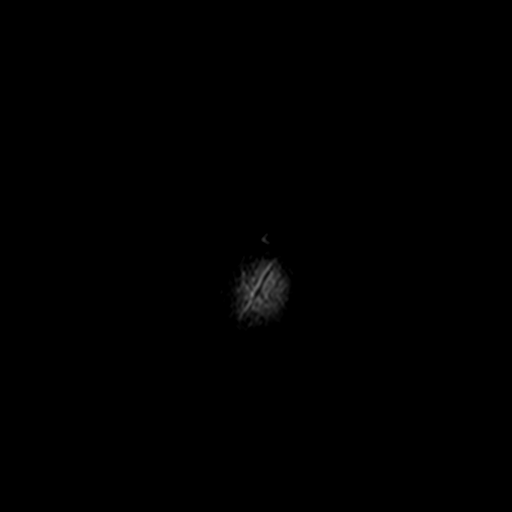

[Series 11: t2_tse_tra_512 · axial · 5.0mm · 0.65mm/px · z∈[-38,+118]mm · 2 of 27 slices shown]
[im 1/27]
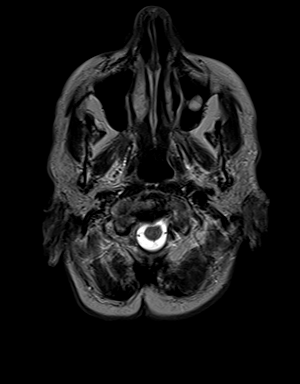
[im 27/27]
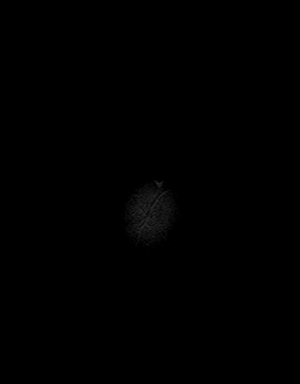

[Series 12: t1_mpr_tra · axial · 1.0mm · 0.78mm/px · z∈[-39,+119]mm · 9 of 160 slices shown]
[im 1/160]
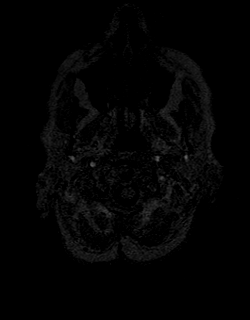
[im 20/160]
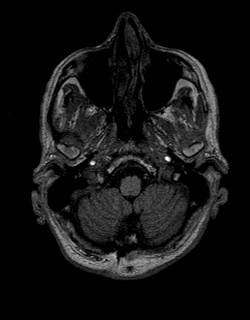
[im 40/160]
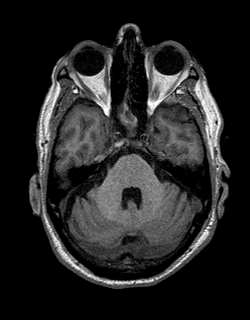
[im 60/160]
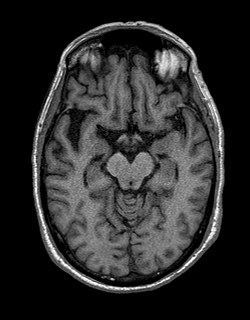
[im 80/160]
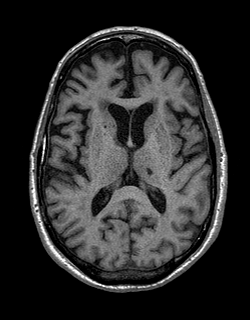
[im 100/160]
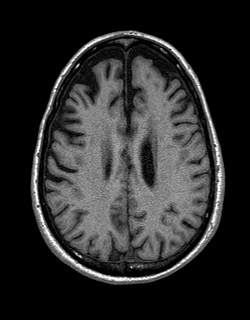
[im 120/160]
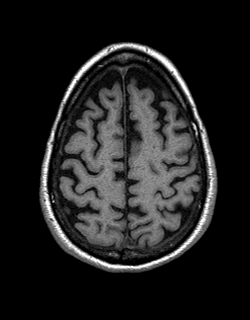
[im 140/160]
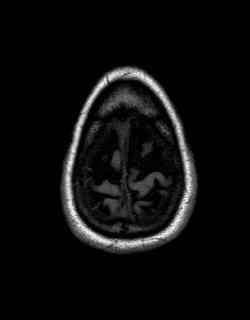
[im 160/160]
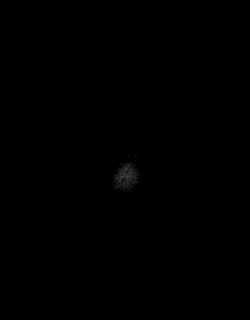

[Series 13: FLAIR · sagittal · 5.0mm · 0.47mm/px · 2 of 27 slices shown (2 of 2)]
[im 1/27]
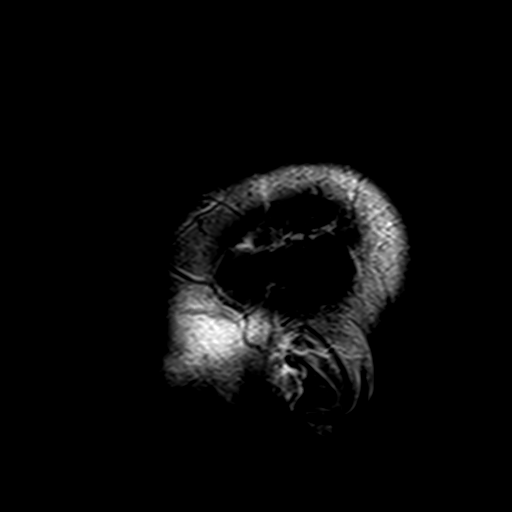
[im 27/27]
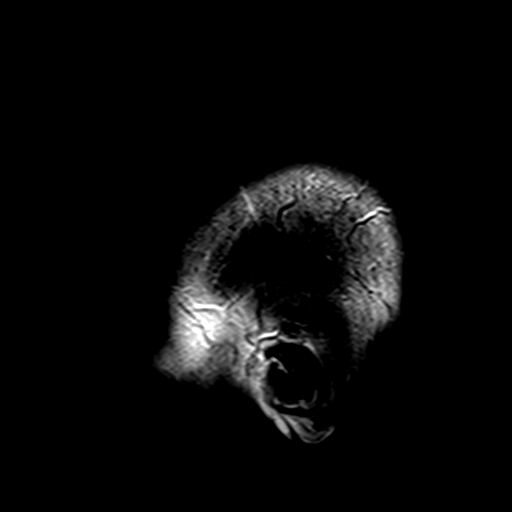

[Series 14: T2 · coronal · 5.0mm · 0.45mm/px · 2 of 33 slices shown]
[im 1/33]
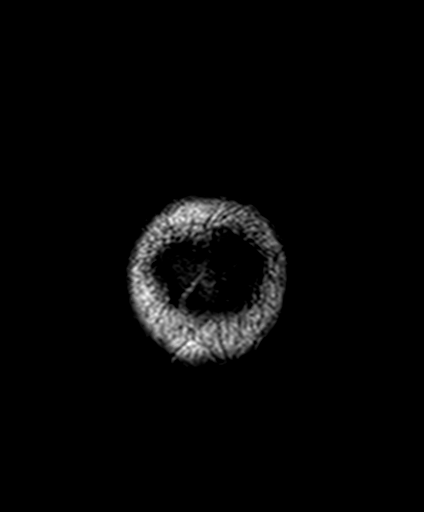
[im 33/33]
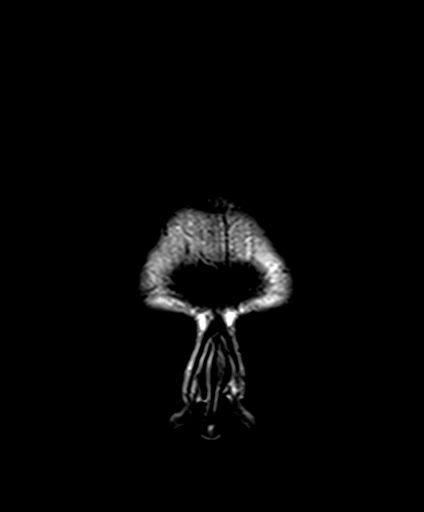

[Series 15: T1 post-contrast · coronal · 5.0mm · 0.72mm/px · 2 of 33 slices shown]
[im 1/33]
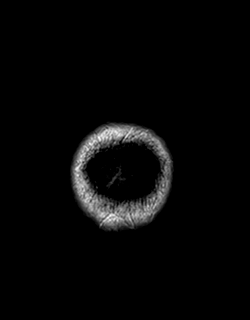
[im 33/33]
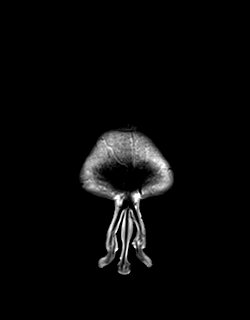

[Series 16: post t1_mpr_tra · axial · 1.0mm · 0.78mm/px · z∈[-39,+119]mm · 9 of 160 slices shown]
[im 1/160]
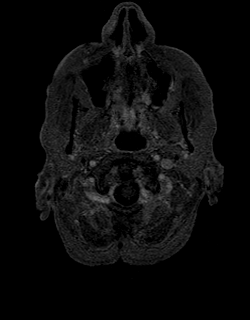
[im 20/160]
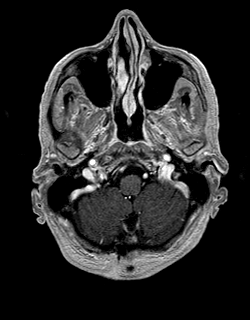
[im 40/160]
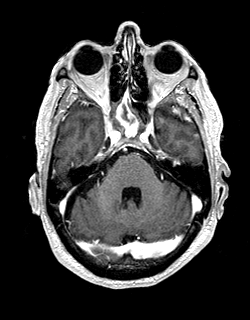
[im 60/160]
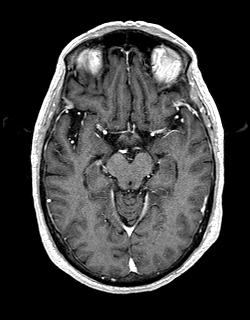
[im 80/160]
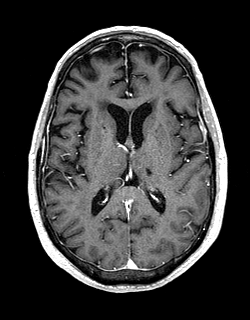
[im 100/160]
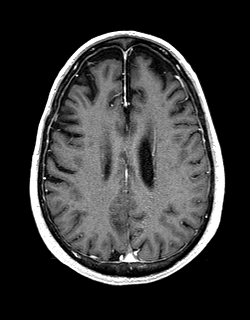
[im 120/160]
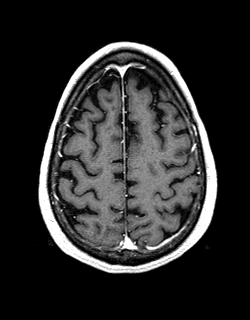
[im 140/160]
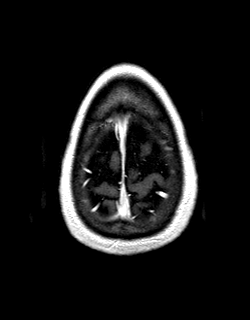
[im 160/160]
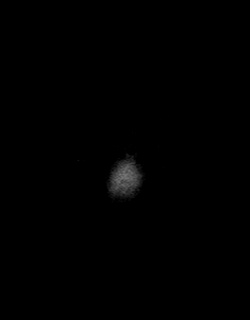

[48 of 48 positions shown; findings below may reference images not displayed]

FINDINGS: Brain: No acute infarct, hemorrhage, or mass lesion is present. Mild
generalized atrophy is present. Moderate periventricular white
matter changes are present bilaterally. Moderate diffuse subcortical
T2 hyperintensities are present as well. There is no associated
enhancement.

A remote nonhemorrhagic lacunar infarct is present in the left
thalamus. Dilated perivascular spaces are present throughout the
basal ganglia. Significant cortical infarcts are present.

The internal auditory canals are within normal limits. The brainstem
and cerebellum are within normal limits.

Postcontrast images demonstrate no pathologic enhancement.

Vascular: Flow is present in the major intracranial arteries.

Skull and upper cervical spine: The craniocervical junction is
normal. Upper cervical spine is within normal limits. Marrow signal
is unremarkable.

Sinuses/Orbits: Polyp or mucous retention cyst is present in the
inferior left maxillary sinus. The paranasal sinuses and mastoid air
cells are otherwise clear.

The globes and orbits are within normal limits.
IMPRESSION: 1. No acute or focal abnormality to explain the patient's symptoms.
2. Mild generalized atrophy and moderate white matter disease. This
likely reflects the sequela of chronic microvascular ischemia.
3. Remote nonhemorrhagic lacunar infarct of the left thalamus.

## 2020-11-20 ENCOUNTER — Other Ambulatory Visit: Payer: Self-pay | Admitting: Family Medicine

## 2020-11-20 DIAGNOSIS — Z818 Family history of other mental and behavioral disorders: Secondary | ICD-10-CM | POA: Diagnosis not present

## 2020-11-20 DIAGNOSIS — F419 Anxiety disorder, unspecified: Secondary | ICD-10-CM | POA: Diagnosis not present

## 2020-11-20 DIAGNOSIS — R030 Elevated blood-pressure reading, without diagnosis of hypertension: Secondary | ICD-10-CM | POA: Diagnosis not present

## 2020-11-20 DIAGNOSIS — Z809 Family history of malignant neoplasm, unspecified: Secondary | ICD-10-CM | POA: Diagnosis not present

## 2020-11-20 DIAGNOSIS — F3341 Major depressive disorder, recurrent, in partial remission: Secondary | ICD-10-CM | POA: Diagnosis not present

## 2020-11-20 DIAGNOSIS — K219 Gastro-esophageal reflux disease without esophagitis: Secondary | ICD-10-CM | POA: Diagnosis not present

## 2020-11-20 DIAGNOSIS — F17210 Nicotine dependence, cigarettes, uncomplicated: Secondary | ICD-10-CM | POA: Diagnosis not present

## 2020-11-20 DIAGNOSIS — I739 Peripheral vascular disease, unspecified: Secondary | ICD-10-CM | POA: Diagnosis not present

## 2020-11-20 DIAGNOSIS — E785 Hyperlipidemia, unspecified: Secondary | ICD-10-CM | POA: Diagnosis not present

## 2020-11-20 DIAGNOSIS — R69 Illness, unspecified: Secondary | ICD-10-CM | POA: Diagnosis not present

## 2020-11-20 DIAGNOSIS — I951 Orthostatic hypotension: Secondary | ICD-10-CM | POA: Diagnosis not present

## 2020-11-20 DIAGNOSIS — G8929 Other chronic pain: Secondary | ICD-10-CM | POA: Diagnosis not present

## 2020-11-20 DIAGNOSIS — Z008 Encounter for other general examination: Secondary | ICD-10-CM | POA: Diagnosis not present

## 2020-12-01 ENCOUNTER — Other Ambulatory Visit: Payer: Self-pay | Admitting: Family Medicine

## 2020-12-07 DIAGNOSIS — R922 Inconclusive mammogram: Secondary | ICD-10-CM | POA: Diagnosis not present

## 2020-12-07 DIAGNOSIS — N644 Mastodynia: Secondary | ICD-10-CM | POA: Diagnosis not present

## 2020-12-07 DIAGNOSIS — N6342 Unspecified lump in left breast, subareolar: Secondary | ICD-10-CM | POA: Diagnosis not present

## 2020-12-07 DIAGNOSIS — N62 Hypertrophy of breast: Secondary | ICD-10-CM | POA: Diagnosis not present

## 2020-12-08 ENCOUNTER — Telehealth: Payer: Self-pay

## 2020-12-08 NOTE — Telephone Encounter (Signed)
Last OV with PCP 01/21/19 with recommendations to Return in about 3 months (around 04/21/2019). AWV 07/08/20.    LVM instructions on son's VM to call to schedule appt with PCP.

## 2020-12-14 ENCOUNTER — Encounter: Payer: Self-pay | Admitting: Family Medicine

## 2020-12-14 ENCOUNTER — Ambulatory Visit (INDEPENDENT_AMBULATORY_CARE_PROVIDER_SITE_OTHER): Payer: Medicare HMO | Admitting: Family Medicine

## 2020-12-14 VITALS — BP 148/82 | HR 91 | Temp 98.4°F | Ht 71.0 in | Wt 147.8 lb

## 2020-12-14 DIAGNOSIS — I10 Essential (primary) hypertension: Secondary | ICD-10-CM | POA: Diagnosis not present

## 2020-12-14 DIAGNOSIS — R69 Illness, unspecified: Secondary | ICD-10-CM | POA: Diagnosis not present

## 2020-12-14 DIAGNOSIS — Z1159 Encounter for screening for other viral diseases: Secondary | ICD-10-CM | POA: Diagnosis not present

## 2020-12-14 DIAGNOSIS — J449 Chronic obstructive pulmonary disease, unspecified: Secondary | ICD-10-CM

## 2020-12-14 DIAGNOSIS — I700 Atherosclerosis of aorta: Secondary | ICD-10-CM | POA: Insufficient documentation

## 2020-12-14 DIAGNOSIS — N644 Mastodynia: Secondary | ICD-10-CM

## 2020-12-14 DIAGNOSIS — E785 Hyperlipidemia, unspecified: Secondary | ICD-10-CM | POA: Diagnosis not present

## 2020-12-14 DIAGNOSIS — Z23 Encounter for immunization: Secondary | ICD-10-CM | POA: Diagnosis not present

## 2020-12-14 DIAGNOSIS — Z122 Encounter for screening for malignant neoplasm of respiratory organs: Secondary | ICD-10-CM | POA: Diagnosis not present

## 2020-12-14 DIAGNOSIS — I739 Peripheral vascular disease, unspecified: Secondary | ICD-10-CM | POA: Diagnosis not present

## 2020-12-14 DIAGNOSIS — F1120 Opioid dependence, uncomplicated: Secondary | ICD-10-CM

## 2020-12-14 LAB — COMPREHENSIVE METABOLIC PANEL
ALT: 5 U/L (ref 0–53)
AST: 17 U/L (ref 0–37)
Albumin: 4 g/dL (ref 3.5–5.2)
Alkaline Phosphatase: 88 U/L (ref 39–117)
BUN: 16 mg/dL (ref 6–23)
CO2: 27 mEq/L (ref 19–32)
Calcium: 8.8 mg/dL (ref 8.4–10.5)
Chloride: 101 mEq/L (ref 96–112)
Creatinine, Ser: 0.94 mg/dL (ref 0.40–1.50)
GFR: 78.99 mL/min (ref >60.00)
Glucose, Bld: 157 mg/dL — ABNORMAL HIGH (ref 70–99)
Potassium: 3.7 mEq/L (ref 3.5–5.1)
Sodium: 136 mEq/L (ref 135–145)
Total Bilirubin: 0.3 mg/dL (ref 0.2–1.2)
Total Protein: 7.2 g/dL (ref 6.0–8.3)

## 2020-12-14 LAB — LIPID PANEL
Cholesterol: 134 mg/dL (ref 0–200)
HDL: 45.7 mg/dL (ref >39.00)
LDL Cholesterol: 51 mg/dL (ref 0–99)
NonHDL: 88.33
Total CHOL/HDL Ratio: 3
Triglycerides: 188 mg/dL — ABNORMAL HIGH (ref 0.0–149.0)
VLDL: 37.6 mg/dL (ref 0.0–40.0)

## 2020-12-14 MED ORDER — ALBUTEROL SULFATE HFA 108 (90 BASE) MCG/ACT IN AERS: 2.0000 | INHALATION_SPRAY | Freq: Four times a day (QID) | RESPIRATORY_TRACT | 1 refills | Status: DC | PRN

## 2020-12-14 MED ORDER — AMLODIPINE BESYLATE 5 MG PO TABS: 5.0000 mg | ORAL_TABLET | Freq: Every day | ORAL | 1 refills | Status: DC

## 2020-12-14 MED ORDER — ATORVASTATIN CALCIUM 40 MG PO TABS: 40.0000 mg | ORAL_TABLET | Freq: Every day | ORAL | 1 refills | Status: DC

## 2020-12-14 MED ORDER — BUDESONIDE-FORMOTEROL FUMARATE 160-4.5 MCG/ACT IN AERO: 2.0000 | INHALATION_SPRAY | Freq: Two times a day (BID) | RESPIRATORY_TRACT | 3 refills | Status: DC

## 2020-12-14 NOTE — Assessment & Plan Note (Addendum)
Following with pain management

## 2020-12-14 NOTE — Assessment & Plan Note (Signed)
BP mildly elevated today. Recommend resuming Amlodipine but a lower dose, 5 mg daily at bedtime. Low-salt diet also recommended. Instructed to monitor BP regularly at olamine about BP readings in 3 to 4 weeks.

## 2020-12-14 NOTE — Assessment & Plan Note (Addendum)
Resume atorvastatin. Due to history of GI bleed, Aspirin is contraindicated. Smoking cessation encouraged. ABI needed to be repeated in 2021, order placed.

## 2020-12-14 NOTE — Patient Instructions (Addendum)
A few things to remember from today's visit:   Encounter for HCV screening test for low risk patient - Plan: Hepatitis C antibody  PAD (peripheral artery disease) ([place])  Breast tenderness in male  Screening for lung cancer - Plan: Ambulatory Referral for Lung Cancer Scre  Hyperlipidemia, unspecified hyperlipidemia type - Plan: Comprehensive metabolic panel, Lipid panel  Essential hypertension - Plan: amLODipine (NORVASC) 5 MG tablet, Comprehensive metabolic panel  Chronic obstructive pulmonary disease, unspecified COPD type ([place]) - Plan: budesonide-formoterol (SYMBICORT) 160-4.5 MCG/ACT inhaler  If you need refills please call your pharmacy. Do not use My Chart to request refills or for acute issues that need immediate attention.   Symbicort started today, 2 puff every 12 hours. Rinse mouth after use. Albuterol inh 2 puff every 6 hours as needed.  Amlodipine to be resumed but lower dose, take 5 mg at bedtime. Monitor blood pressure and let me know about readings in 3-4 weeks. Let me know if you decide to go to breast surgeon.  Resume Atorvastatin.  Please be sure medication list is accurate. If a new problem present, please set up appointment sooner than planned today.  Gynecomastia, Adult Gynecomastia is an overgrowth of gland tissue in a man's breasts. This may cause one or both breasts to become enlarged. The condition often develops in men who have an imbalance of the male sex hormone (testosterone) and the male sex hormone (estrogen). This means that a man may have too much estrogen, too little testosterone, or both. Gynecomastia may be a normal part of aging for some men. It can also happen to adolescent boys during puberty. What are the causes? This condition may be caused by: Certain medicines, such as: Estrogen supplements and medicines that act like estrogen in the body. Medicines that keep testosterone from functioning normally in the body (testosterone-inhibiting  drugs). Anabolic steroids. Medicines to treat heartburn, cancer, heart disease, mental health problems, HIV, or AIDS. Antibiotic medicine. Chemotherapy medicine. Recreational drugs, including alcohol, marijuana, and opioids. Herbal products, including lavender and tea tree oil. A gene that is passed from parent to child (inherited). Certain medical conditions, such as: Tumors in the pituitary or adrenal gland. An overactive thyroid gland. Certain inherited disorders, including a genetic disease that causes low testosterone in males (Klinefelter syndrome). Cancer of the lung, kidney, liver, testicle, or gastrointestinal tract. Conditions that cause liver or kidney failure. Poor nutrition and starvation. Testicle shrinking or failure (testicularatrophy). In some cases, the cause may not be known. What increases the risk? The following factors may make you more likely to develop this condition: Being 8 years old or older. Being overweight. Abusing alcohol or other drugs. Having a family history of gynecomastia. What are the signs or symptoms? In most cases, breast enlargement is the only symptom. The enlargement may start near the nipple, and the breast tissue may feel firm and rubbery. Other symptoms may include: Pain or tenderness in the breasts. Itchy breasts. How is this diagnosed? This condition may be diagnosed based on: Your symptoms and medical history. A physical exam. Imaging tests, such as: An ultrasound. A mammogram. An MRI. Blood tests. Removal of a sample of breast tissue to be tested in a lab (biopsy). How is this treated? This condition may go away on its own, without treatment. If gynecomastia is caused by a medical problem or drug abuse, treatment may include: Getting treatment for the underlying medical problem or for drug abuse. Changing or stopping medicines. Medicines to block the effects of estrogen. Taking  a testosterone replacement. Surgery to remove  breast tissue or any lumps in your breasts. Breast reduction surgery. This may be an option if you have severe or painful gynecomastia. Follow these instructions at home:  Take over-the-counter and prescription medicines only as told by your health care provider. Talk to your health care provider before taking any herbal medicines or diet supplements. Do not abuse drugs or alcohol. Keep all follow-up visits as told by your health care provider. This is important. Contact a health care provider if: Your breast tissue grows larger or gets more swollen or painful. You have a lump in your testicle. You have blood or discharge coming from your nipples. Your nipple changes shape. You develop a hard or painful lump in your breast. Summary Gynecomastia is an overgrowth of gland tissue in a man's breasts. This may cause one or both breasts to become enlarged. In most cases, breast enlargement is the only symptom. The enlargement may start near the nipple, and the breast tissue may feel firm and rubbery. This condition may go away on its own, without treatment. In some cases, treatment for an underlying medical problem or for drug abuse may be needed. Take over-the-counter and prescription medicines only as told by your health care provider. Do not abuse drugs or alcohol. This information is not intended to replace advice given to you by your health care provider. Make sure you discuss any questions you have with your health care provider. Document Revised: 07/12/2018 Document Reviewed: 07/12/2018 Elsevier Patient Education  [employer].

## 2020-12-14 NOTE — Progress Notes (Signed)
Chief Complaint  Patient presents with   Follow-up  HPI:  Michael Bender is a 75 y.o. male, who is here today with his son for chronic disease management. Last seen on 01/21/19. Since his last visit he has been hospitalized for GI bleed, 07/17/2019. He follows with chronic pain management and on chronic opioid treatment.  Hypertension:  He is not longer on Amlodipine. BP readings at home:Not checking. Side effects:Does not remember having any when he took med.  Negative for unusual or severe headache, visual changes, exertional chest pain,dyspnea,  focal weakness, or edema.  Lab Results  Component Value Date   CREATININE 0.73 07/20/2019   BUN 7 (L) 07/20/2019   NA 137 07/20/2019   K 3.8 07/20/2019   CL 105 07/20/2019   CO2 26 07/20/2019   COPD: He is not on inhalers. He did not feels like Advair 250-50 mcg helped. + Smoker since 13 to, 1 PPD. Chest CT in 07/2019:  1. No evidence of pulmonary embolus. 2. Aortic Atherosclerosis (ICD10-I70.0) and Emphysema (ICD10-J43.9).  Productive cough in the morning and some wheezing. Negative for DOE and hemoptysis. Wt has been stable since hospital discharge in 07/2020.  Hyperlipidemia: He is not taking Atorvastatin. He does not remember having side effects. PAD, numbness LE's, stable for years. Negative for claudication like symptoms. Right: Resting right ankle-brachial index is within normal range. No evidence of significant right lower extremity arterial disease. The right toe-brachial index is normal.  Left: Resting left ankle-brachial index indicates mild left lower extremity arterial disease. The left toe-brachial index is normal.   Lab Results  Component Value Date   CHOL 170 09/08/2015   HDL 35.40 (L) 09/08/2015   LDLDIRECT 102.0 09/08/2015   TRIG 215.0 (H) 09/08/2015   CHOLHDL 5 09/08/2015   He is concerned about left breast tenderness that is started about 2 months ago. According the patient, he discussed  problem with pain management provided and he had a negative diagnostic mammogram on 12/07/2020. He has not noted erythema, edema, local heat, or nipple discharge. He was told he could be gynecomasty caused by some of his medications. Negative for history of trauma.  Review of Systems  Constitutional:  Positive for fatigue. Negative for activity change, appetite change and fever.  HENT:  Negative for mouth sores, nosebleeds, sore throat and trouble swallowing.   Gastrointestinal:  Negative for abdominal pain, nausea and vomiting.  Genitourinary:  Negative for decreased urine volume, dysuria and hematuria.  Musculoskeletal:  Positive for arthralgias and back pain.  Neurological:  Negative for syncope and facial asymmetry.  Psychiatric/Behavioral:  Negative for confusion.   Rest of ROS see pertinent positives and negatives in HPI.  Current Outpatient Medications on File Prior to Visit  Medication Sig Dispense Refill   acetaminophen (TYLENOL) 325 MG tablet Take 2 tablets (650 mg total) by mouth every 6 (six) hours as needed for mild pain (or Fever >/= 101).     amitriptyline (ELAVIL) 50 MG tablet Take 50 mg by mouth in the morning, at noon, and at bedtime.      mupirocin ointment (BACTROBAN) 2 % APPLY TO AFFECTED AREA 3 TIMES A DAY     oxycodone (ROXICODONE) 30 MG immediate release tablet Take 30 mg by mouth 3 (three) times daily.      pantoprazole (PROTONIX) 40 MG tablet TAKE 1 TABLET BY MOUTH TWICE A DAY 180 tablet 1   polyethylene glycol (MIRALAX / GLYCOLAX) 17 g packet Take 34 g by mouth daily.  sucralfate (CARAFATE) 1 GM/10ML suspension Take 10 mLs (1 g total) by mouth 4 (four) times daily -  with meals and at bedtime. 1200 mL 0   No current facility-administered medications on file prior to visit.   Past Medical History:  Diagnosis Date   Arthritis    Back   Blood transfusion    as a child   Chronic back pain    COPD (chronic obstructive pulmonary disease) (HCC)     Headache(784.0)    last one 6 months ago   Hemorrhoids, internal    HLD (hyperlipidemia)    Hypertension    Neck rigidity    post cervical fusion   Nocturia    Pneumonia    Prostate disease     Allergies  Allergen Reactions   Nubain [Nalbuphine Hcl]     Muscle contraction    Social History   Socioeconomic History   Marital status: Divorced    Spouse name: Not on file   Number of children: 2   Years of education: Not on file   Highest education level: Not on file  Occupational History   Occupation: Disabled  Tobacco Use   Smoking status: Former    Years: 40.00    Types: Cigarettes    Quit date: 12/12/2018    Years since quitting: 2.0   Smokeless tobacco: Never  Vaping Use   Vaping Use: Never used  Substance and Sexual Activity   Alcohol use: Not Currently    Alcohol/week: 2.0 standard drinks    Types: 2 Shots of liquor per week   Drug use: No   Sexual activity: Not on file  Other Topics Concern   Not on file  Social History Narrative   Patient is divorced 2 sons he is a retired Scientist, 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  He drinks about a liter of caffeinated drinks a day past smoker no drug use no alcohol.   Social Determinants of Health   Financial Resource Strain: Low Risk    Difficulty of Paying Living Expenses: Not hard at all  Food Insecurity: No Food Insecurity   Worried About Charity fundraiser in the Last Year: Never true   [place] in the Last Year: Never true  Transportation Needs: No Transportation Needs   Lack of Transportation (Medical): No   Lack of Transportation (Non-Medical): No  Physical Activity: Sufficiently Active   Days of Exercise per Week: 5 days   Minutes of Exercise per Session: 40 min  Stress: No Stress Concern Present   Feeling of Stress : Not at all  Social Connections: Socially Isolated   Frequency of Communication with Friends and Family: Never   Frequency of Social Gatherings  with Friends and Family: Never   Attends Religious Services: Never   Marine scientist or Organizations: No   Attends Archivist Meetings: Never   Marital Status: Divorced    Today's Vitals   12/14/20 1214  BP: (!) 148/82  Pulse: 91  Temp: 98.4 F (36.9 C)  TempSrc: Oral  SpO2: 98%  Weight: 147 lb 12.8 oz (67 kg)  Height: 5\' 11"  (1.803 m)   Body mass index is 20.61 kg/m.  Physical Exam Vitals and nursing note reviewed.  Constitutional:      General: He is not in acute distress.    Appearance: He is well-developed.  HENT:     Head: Normocephalic and atraumatic.  Mouth/Throat:     Mouth: Mucous membranes are dry.     Dentition: Has dentures.  Eyes:     Conjunctiva/sclera: Conjunctivae normal.  Cardiovascular:     Rate and Rhythm: Normal rate and regular rhythm.     Pulses:          Dorsalis pedis pulses are 2+ on the right side and 2+ on the left side.     Heart sounds: No murmur heard. Pulmonary:     Effort: Pulmonary effort is normal. No respiratory distress.     Breath sounds: Wheezing present. No rhonchi or rales.  Chest:    Abdominal:     Palpations: Abdomen is soft. There is no hepatomegaly or mass.     Tenderness: There is no abdominal tenderness.  Lymphadenopathy:     Cervical: No cervical adenopathy.     Upper Body:     Right upper body: No axillary adenopathy.     Left upper body: No axillary adenopathy.  Skin:    General: Skin is warm.     Findings: No erythema or rash.  Neurological:     Mental Status: He is alert and oriented to person, place, and time.     Gait: Gait normal.     Comments: Decreased sensation of LE's, below knee. Reported as stable for years.  Psychiatric:     Comments: Well groomed, good eye contact.   ASSESSMENT AND PLAN:  Mr.Meilech was seen today for follow-up.  Diagnoses and all orders for this visit: Orders Placed This Encounter  Procedures   Flu Vaccine QUAD High Dose(Fluad)   Hepatitis C antibody    Comprehensive metabolic panel   Lipid panel   Ambulatory Referral for Lung Cancer Scre   VAS [place] ABI WITH/WO TBI   Lab Results  Component Value Date   CREATININE 0.94 12/14/2020   BUN 16 12/14/2020   NA 136 12/14/2020   K 3.7 12/14/2020   CL 101 12/14/2020   CO2 27 12/14/2020   Lab Results  Component Value Date   ALT 5 12/14/2020   AST 17 12/14/2020   ALKPHOS 88 12/14/2020   BILITOT 0.3 12/14/2020   Lab Results  Component Value Date   CHOL 134 12/14/2020   HDL 45.70 12/14/2020   LDLCALC 51 12/14/2020   LDLDIRECT 102.0 09/08/2015   TRIG 188.0 (H) 12/14/2020   CHOLHDL 3 12/14/2020   Breast tenderness in male Small nodule found on examination. We discussed possible etiologies, some of his medications could be contributing factors for gynecomastia (amitriptyline and pantoprazole). Given his age and history of tobacco use, malignancy needs to be considered.  Recommend surgical evaluation, he declined. We will try to obtain result of diagnostic mammogram.  Encounter for HCV screening test for low risk patient -     Hepatitis C antibody  Screening for lung cancer -     Ambulatory Referral for Lung Cancer Scre  Need for influenza vaccination -     Flu Vaccine QUAD High Dose(Fluad)  Essential hypertension BP mildly elevated today. Recommend resuming Amlodipine but a lower dose, 5 mg daily at bedtime. Low-salt diet also recommended. Instructed to monitor BP regularly at olamine about BP readings in 3 to 4 weeks.  PAD (peripheral artery disease) ([facility]) Resume atorvastatin. Due to history of GI bleed, Aspirin is contraindicated. Smoking cessation encouraged. ABI needed to be repeated in 2021, order placed.   HLD (hyperlipidemia) LDL goal < 70. Instructed to resume atorvastatin 40 mg daily. Further recommendation will be given  according to lipid panel result.  Chronic narcotic dependence ([place]) Following with pain management.  Atherosclerosis of aorta ([place]) We  discussed imaging findings. He is resuming atorvastatin 40 mg daily.  COPD (chronic obstructive pulmonary disease) ([place]) Problem is not well controlled, symptoms are stable. He would like to try a different inhaler, Symbicort 160-4.5 mcg 2 puffs twice daily recommended.  Instructed to rinse mouth after use. Albuterol inh 2 puff every 6 hours for a week then as needed for wheezing or shortness of breath. Smoking cessation encouraged, he is thinking about doing so, he does not want pharmacologic treatment. I do not think imaging is needed at this time. Instructed about warning signs.  I spent a total of 48 minutes in both face to face and non face to face activities for this visit on the date of this encounter. During this time history was obtained and documented, examination was performed, prior labs/imaging reviewed, and assessment/plan discussed.  Return in about 4 months (around 04/13/2021).   Deklynn Charlet [place], MD [facility]. [place] office.

## 2020-12-14 NOTE — Assessment & Plan Note (Signed)
LDL goal < 70. Instructed to resume atorvastatin 40 mg daily. Further recommendation will be given according to lipid panel result.

## 2020-12-14 NOTE — Assessment & Plan Note (Signed)
Problem is not well controlled, symptoms are stable. He would like to try a different inhaler, Symbicort 160-4.5 mcg 2 puffs twice daily recommended.  Instructed to rinse mouth after use. Albuterol inh 2 puff every 6 hours for a week then as needed for wheezing or shortness of breath. Smoking cessation encouraged, he is thinking about doing so, he does not want pharmacologic treatment. I do not think imaging is needed at this time. Instructed about warning signs.

## 2020-12-14 NOTE — Assessment & Plan Note (Signed)
We discussed imaging findings. He is resuming atorvastatin 40 mg daily.

## 2020-12-15 LAB — HEPATITIS C ANTIBODY
Hepatitis C Ab: NONREACTIVE
SIGNAL TO CUT-OFF: 0.03 (ref <1.00)

## 2020-12-28 ENCOUNTER — Encounter (HOSPITAL_COMMUNITY): Payer: Medicare HMO

## 2020-12-30 ENCOUNTER — Ambulatory Visit (HOSPITAL_COMMUNITY)
Admission: RE | Admit: 2020-12-30 | Discharge: 2020-12-30 | Disposition: A | Discharge status: Home / Self Care | Payer: Medicare HMO | Source: Ambulatory Visit | Attending: Family Medicine | Admitting: Family Medicine

## 2020-12-30 ENCOUNTER — Other Ambulatory Visit: Payer: Self-pay

## 2020-12-30 DIAGNOSIS — I739 Peripheral vascular disease, unspecified: Secondary | ICD-10-CM | POA: Diagnosis not present

## 2021-04-07 ENCOUNTER — Other Ambulatory Visit: Payer: Self-pay | Admitting: Internal Medicine

## 2021-04-14 ENCOUNTER — Other Ambulatory Visit: Payer: Self-pay | Admitting: Family Medicine

## 2021-04-14 DIAGNOSIS — J449 Chronic obstructive pulmonary disease, unspecified: Secondary | ICD-10-CM

## 2021-04-23 ENCOUNTER — Telehealth: Payer: Self-pay

## 2021-04-23 NOTE — Telephone Encounter (Signed)
Last OV 12/14/20 notes:  ?Return in about 4 months (around 04/13/2021). ? ?LVM on son's voicemail to schedule f/u appt with PCP. ?

## 2021-07-14 ENCOUNTER — Ambulatory Visit (INDEPENDENT_AMBULATORY_CARE_PROVIDER_SITE_OTHER): Payer: Medicare HMO

## 2021-07-14 ENCOUNTER — Ambulatory Visit: Payer: Medicare HMO

## 2021-07-14 VITALS — Ht 71.0 in | Wt 153.0 lb

## 2021-07-14 DIAGNOSIS — Z0000 Encounter for general adult medical examination without abnormal findings: Secondary | ICD-10-CM

## 2021-07-14 NOTE — Progress Notes (Signed)
Subjective:   Michael Bender is a 76 y.o. male who presents for Medicare Annual/Subsequent preventive examination.  Review of Systems    Virtual Visit via Telephone Note  I connected with  Michael Bender on 07/14/21 at 11:00 AM EDT by telephone and verified that I am speaking with the correct person using two identifiers.  Location: Patient: Home Provider: Office Persons participating in the virtual visit: patient/Nurse Health Advisor   I discussed the limitations, risks, security and privacy concerns of performing an evaluation and management service by telephone and the availability of in person appointments. The patient expressed understanding and agreed to proceed.  Interactive audio and video telecommunications were attempted between this nurse and patient, however failed, due to patient having technical difficulties OR patient did not have access to video capability.  We continued and completed visit with audio only.  Some vital signs may be absent or patient reported.   Criselda Peaches, LPN  Cardiac Risk Factors include: advanced age (>19mn, >>60women);hypertension;male gender     Objective:    Today's Vitals   07/14/21 1103  Weight: 153 lb (69.4 kg)  Height: '5\' 11"'$  (1.803 m)   Body mass index is 21.34 kg/m.     07/14/2021   11:12 AM 07/08/2020   11:10 AM 07/17/2019    9:43 PM 07/17/2019    4:58 PM 02/19/2019    8:43 AM 11/16/2018    7:29 AM 07/20/2011    8:45 AM  Advanced Directives  Does Patient Have a Medical Advance Directive? No Yes No No No No Patient does not have advance directive  Type of Advance Directive  Living will       Would patient like information on creating a medical advance directive? No - Patient declined  No - Patient declined No - Patient declined  No - Patient declined     Current Medications (verified) Outpatient Encounter Medications as of 07/14/2021  Medication Sig   acetaminophen (TYLENOL) 325 MG tablet Take 2 tablets (650 mg  total) by mouth every 6 (six) hours as needed for mild pain (or Fever >/= 101).   albuterol (VENTOLIN HFA) 108 (90 Base) MCG/ACT inhaler Inhale 2 puffs into the lungs every 6 (six) hours as needed for wheezing or shortness of breath.   amitriptyline (ELAVIL) 50 MG tablet Take 50 mg by mouth in the morning, at noon, and at bedtime.    amLODipine (NORVASC) 5 MG tablet Take 1 tablet (5 mg total) by mouth at bedtime.   atorvastatin (LIPITOR) 40 MG tablet Take 1 tablet (40 mg total) by mouth daily.   mupirocin ointment (BACTROBAN) 2 % APPLY TO AFFECTED AREA 3 TIMES A DAY   oxycodone (ROXICODONE) 30 MG immediate release tablet Take 30 mg by mouth 3 (three) times daily.    pantoprazole (PROTONIX) 40 MG tablet TAKE 1 TABLET BY MOUTH TWICE A DAY   polyethylene glycol (MIRALAX / GLYCOLAX) 17 g packet Take 34 g by mouth daily.    sucralfate (CARAFATE) 1 GM/10ML suspension Take 10 mLs (1 g total) by mouth 4 (four) times daily -  with meals and at bedtime.   SYMBICORT 160-4.5 MCG/ACT inhaler INHALE 2 PUFFS INTO THE LUNGS TWICE A DAY   No facility-administered encounter medications on file as of 07/14/2021.    Allergies (verified) Nubain [nalbuphine hcl]   History: Past Medical History:  Diagnosis Date   Arthritis    Back   Blood transfusion    as a child  Chronic back pain    COPD (chronic obstructive pulmonary disease) (HCC)    Headache(784.0)    last one 6 months ago   Hemorrhoids, internal    HLD (hyperlipidemia)    Hypertension    Neck rigidity    post cervical fusion   Nocturia    Pneumonia    Prostate disease    Past Surgical History:  Procedure Laterality Date   BIOPSY  07/19/2019   Procedure: BIOPSY;  Surgeon: Lavena Bullion, DO;  Location: [facility];  Service: Gastroenterology;;   CERVICAL FUSION  1992   C2/C 3  four surgeries   COLONOSCOPY     ESOPHAGOGASTRODUODENOSCOPY  07/20/2011   Procedure: ESOPHAGOGASTRODUODENOSCOPY (EGD);  Surgeon: Gatha Mayer, MD;  Location:  [facility];  Service: Endoscopy;  Laterality: N/A;   ESOPHAGOGASTRODUODENOSCOPY (EGD) WITH PROPOFOL N/A 07/19/2019   Procedure: ESOPHAGOGASTRODUODENOSCOPY (EGD) WITH PROPOFOL;  Surgeon: Lavena Bullion, DO;  Location: [facility];  Service: Gastroenterology;  Laterality: N/A;   SAVORY DILATION  07/20/2011   Procedure: SAVORY DILATION;  Surgeon: Gatha Mayer, MD;  Location: [facility];  Service: Endoscopy;  Laterality: N/A;   SPINAL FUSION  05/06/11   Family History  Problem Relation Age of Onset   Heart disease Mother    Heart attack Mother 57   Pancreatic cancer Father 34   Pancreatic cancer Brother    Anesthesia problems Neg Hx    Colon cancer Neg Hx    Liver cancer Neg Hx    Stomach cancer Neg Hx    Esophageal cancer Neg Hx    Rectal cancer Neg Hx    Social History   Socioeconomic History   Marital status: Divorced    Spouse name: Not on file   Number of children: 2   Years of education: Not on file   Highest education level: Not on file  Occupational History   Occupation: Disabled  Tobacco Use   Smoking status: Former    Years: 40.00    Types: Cigarettes    Quit date: 12/12/2018    Years since quitting: 2.5   Smokeless tobacco: Never  Vaping Use   Vaping Use: Never used  Substance and Sexual Activity   Alcohol use: Not Currently    Alcohol/week: 2.0 standard drinks of alcohol    Types: 2 Shots of liquor per week   Drug use: No   Sexual activity: Not on file  Other Topics Concern   Not on file  Social History Narrative   Patient is divorced 2 sons he is a retired Scientist, 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  He drinks about a liter of caffeinated drinks a day past smoker no drug use no alcohol.   Social Determinants of Health   Financial Resource Strain: Low Risk  (07/14/2021)   Overall Financial Resource Strain (CARDIA)    Difficulty of Paying Living Expenses: Not hard at all  Food Insecurity: No Food Insecurity  (07/14/2021)   Hunger Vital Sign    Worried About Running Out of Food in the Last Year: Never true    Ran Out of Food in the Last Year: Never true  Transportation Needs: No Transportation Needs (07/14/2021)   PRAPARE - Hydrologist (Medical): No    Lack of Transportation (Non-Medical): No  Physical Activity: Sufficiently Active (07/14/2021)   Exercise Vital Sign    Days of Exercise per Week: 7 days    Minutes  of Exercise per Session: 150+ min  Stress: No Stress Concern Present (07/14/2021)   [place]    Feeling of Stress : Not at all  Social Connections: Socially Isolated (07/14/2021)   Social Connection and Isolation Panel [NHANES]    Frequency of Communication with Friends and Family: Twice a week    Frequency of Social Gatherings with Friends and Family: Once a week    Attends Religious Services: Never    Marine scientist or Organizations: No    Attends Archivist Meetings: Never    Marital Status: Widowed    Tobacco Counseling Counseling given: Not Answered   Clinical Intake:  Pre-visit preparation completed: No  Pain : No/denies pain     BMI - recorded: 20.62 Nutritional Status: BMI of 19-24  Normal Nutritional Risks: None Diabetes: No  How often do you need to have someone help you when you read instructions, pamphlets, or other written materials from your doctor or pharmacy?: 1 - Never  Diabetic?  No  Interpreter Needed?: No  Information entered by :: Rolene Arbour LPN   Activities of Daily Living    07/14/2021   11:11 AM  In your present state of health, do you have any difficulty performing the following activities:  Hearing? 0  Vision? 0  Difficulty concentrating or making decisions? 0  Walking or climbing stairs? 0  Dressing or bathing? 0  Doing errands, shopping? 0  Preparing Food and eating ? N  Using the Toilet? N  In the past six  months, have you accidently leaked urine? N  Do you have problems with loss of bowel control? N  Managing your Medications? N  Managing your Finances? N  Housekeeping or managing your Housekeeping? N    Patient Care Team: [place], Betty G, MD as PCP - General (Family Medicine) Tat, Eustace Quail, DO as Consulting Physician (Neurology)  Indicate any recent Medical Services you may have received from other than [facility] providers in the past year (date may be approximate).     Assessment:   This is a routine wellness examination for Corran.  Hearing/Vision screen Hearing Screening - Comments:: No haring difficulty Vision Screening - Comments:: Wears glasses. Followed by [facility]  Dietary issues and exercise activities discussed: Exercise limited by: None identified   Goals Addressed             This Visit's Progress    Patient Stated       None at this time.       Depression Screen    07/14/2021   11:07 AM 12/14/2020    1:48 PM 07/08/2020   11:08 AM 04/09/2014    9:28 AM  PHQ 2/9 Scores  PHQ - 2 Score 0 0 0 0    Fall Risk    07/14/2021   11:11 AM 12/14/2020    1:47 PM 07/08/2020   11:11 AM 02/19/2019    8:43 AM 04/09/2014    9:28 AM  Fall Risk   Falls in the past year? 0 0 0 0 No  Number falls in past yr: 0 0 0 0   Injury with Fall? 0 0 0 0   Risk for fall due to : No Fall Risks Impaired balance/gait;Orthopedic patient Impaired vision    Follow up  Education provided Falls prevention discussed      FALL RISK PREVENTION PERTAINING TO THE HOME:  Any stairs in or around the home? No  If so, are there any without handrails? No  Home free of loose throw rugs in walkways, pet beds, electrical cords, etc? Yes  Adequate lighting in your home to reduce risk of falls? Yes   ASSISTIVE DEVICES UTILIZED TO PREVENT FALLS:  Life alert? No  Use of a cane, walker or w/c? No  Grab bars in the bathroom? No  Shower chair or bench in shower? No  Elevated toilet seat or a  handicapped toilet? No   TIMED UP AND GO:  Was the test performed? No . Audio Visit   Cognitive Function:        07/14/2021   11:12 AM 07/08/2020   11:15 AM  6CIT Screen  What Year? 0 points 0 points  What month? 0 points 0 points  What time? 0 points 0 points  Count back from 20 0 points 0 points  Months in reverse 2 points 4 points  Repeat phrase 0 points 4 points  Total Score 2 points 8 points    Immunizations Immunization History  Administered Date(s) Administered   Fluad Quad(high Dose 65+) 12/14/2020   Influenza, High Dose Seasonal PF 09/19/2018   Pneumococcal Conjugate-13 04/09/2014, 12/25/2017   Td 10/12/2004, 01/31/2013    TDAP status: Up to date  Flu Vaccine status: Up to date    Covid-19 vaccine status: Declined, Education has been provided regarding the importance of this vaccine but patient still declined. Advised may receive this vaccine at local pharmacy or Health Dept.or vaccine clinic. Aware to provide a copy of the vaccination record if obtained from local pharmacy or Health Dept. Verbalized acceptance and understanding.  Qualifies for Shingles Vaccine? Yes   Zostavax completed No   Shingrix Completed?: No.    Education has been provided regarding the importance of this vaccine. Patient has been advised to call insurance company to determine out of pocket expense if they have not yet received this vaccine. Advised may also receive vaccine at local pharmacy or Health Dept. Verbalized acceptance and understanding.  Screening Tests Health Maintenance  Topic Date Due   Pneumonia Vaccine 65+ Years old (2 - PPSV23 if available, else PCV20) 02/19/2018   COVID-19 Vaccine (1) 07/30/2021 (Originally 05/24/1946)   Zoster Vaccines- Shingrix (1 of 2) 10/14/2021 (Originally [birth year]24[birth year]1994)   INFLUENZA VACCINE  08/31/2021   TETANUS/TDAP  02/01/2023   Hepatitis C Screening  Completed   HPV VACCINES  Aged Out   COLONOSCOPY (Pts 45-67yr Insurance coverage will need to  be confirmed)  Discontinued    Health Maintenance  Health Maintenance Due  Topic Date Due   Pneumonia Vaccine 77 Years old (2 - PPSV23 if available, else PCV20) 02/19/2018    Colorectal cancer screening: No longer required.   Lung Cancer Screening: (Low Dose CT Chest recommended if Age 77-80years, 30 pack-year currently smoking OR have quit w/in 15years.) does not qualify.     Additional Screening:  Hepatitis C Screening: does qualify; Completed 12/14/20  Vision Screening: Recommended annual ophthalmology exams for early detection of glaucoma and other disorders of the eye. Is the patient up to date with their annual eye exam?  Yes  Who is the provider or what is the name of the [facility] which the patient [facility]ends annual [facility] exams[facility] is not established with a provider, would they like to be referred to a provider to establish care? No .   Dental Screening: Recommended annual dental exams for proper oral hygiene  Community Resource Referral / Chronic Care  Management:  CRR required this visit?  No   CCM required this visit?  No      Plan:     I have personally reviewed and noted the following in the patient's chart:   Medical and social history Use of alcohol, tobacco or illicit drugs  Current medications and supplements including opioid prescriptions. Patient is currently taking opioid prescriptions. Information provided to patient regarding non-opioid alternatives. Patient advised to discuss non-opioid treatment plan with their provider. Functional ability and status Nutritional status Physical activity Advanced directives List of other physicians Hospitalizations, surgeries, and ER visits in previous 12 months Vitals Screenings to include cognitive, depression, and falls Referrals and appointments  In addition, I have reviewed and discussed with patient certain preventive protocols, quality metrics, and best practice recommendations. A  written personalized care plan for preventive services as well as general preventive health recommendations were provided to patient.     Criselda Peaches, LPN   [id number]   Nurse Notes: None

## 2021-07-14 NOTE — Patient Instructions (Addendum)
Michael Bender , Thank you for taking time to come for your Medicare Wellness Visit. I appreciate your ongoing commitment to your health goals. Please review the following plan we discussed and let me know if I can assist you in the future.   These are the goals we discussed:  Goals      Patient Stated     None at this time.        This is a list of the screening recommended for you and due dates:  Health Maintenance  Topic Date Due   Pneumonia Vaccine (2 - PPSV23 if available, else PCV20) 02/19/2018   COVID-19 Vaccine (1) 07/30/2021*   Zoster (Shingles) Vaccine (1 of 2) 10/14/2021*   Flu Shot  08/31/2021   Tetanus Vaccine  02/01/2023   Hepatitis C Screening: USPSTF Recommendation to screen - Ages 18-79 yo.  Completed   HPV Vaccine  Aged Out   Colon Cancer Screening  Discontinued  *Topic was postponed. The date shown is not the original due date.    Opioid Pain Medicine Management Opioids are powerful medicines that are used to treat moderate to severe pain. When used for short periods of time, they can help you to: Sleep better. Do better in physical or occupational therapy. Feel better in the first few days after an injury. Recover from surgery. Opioids should be taken with the supervision of a trained health care provider. They should be taken for the shortest period of time possible. This is because opioids can be addictive, and the longer you take opioids, the greater your risk of addiction. This addiction can also be called opioid use disorder. What are the risks? Using opioid pain medicines for longer than 3 days increases your risk of side effects. Side effects include: Constipation. Nausea and vomiting. Breathing difficulties (respiratory depression). Drowsiness. Confusion. Opioid use disorder. Itching. Taking opioid pain medicine for a long period of time can affect your ability to do daily tasks. It also puts you at risk for: Motor vehicle  crashes. Depression. Suicide. Heart attack. Overdose, which can be life-threatening. What is a pain treatment plan? A pain treatment plan is an agreement between you and your health care provider. Pain is unique to each person, and treatments vary depending on your condition. To manage your pain, you and your health care provider need to work together. To help you do this: Discuss the goals of your treatment, including how much pain you might expect to have and how you will manage the pain. Review the risks and benefits of taking opioid medicines. Remember that a good treatment plan uses more than one approach and minimizes the chance of side effects. Be honest about the amount of medicines you take and about any drug or alcohol use. Get pain medicine prescriptions from only one health care provider. Pain can be managed with many types of alternative treatments. Ask your health care provider to refer you to one or more specialists who can help you manage pain through: Physical or occupational therapy. Counseling (cognitive behavioral therapy). Good nutrition. Biofeedback. Massage. Meditation. Non-opioid medicine. Following a gentle exercise program. How to use opioid pain medicine Taking medicine Take your pain medicine exactly as told by your health care provider. Take it only when you need it. If your pain gets less severe, you may take less than your prescribed dose if your health care provider approves. If you are not having pain, do nottake pain medicine unless your health care provider tells you to take it.  If your pain is severe, do nottry to treat it yourself by taking more pills than instructed on your prescription. Contact your health care provider for help. Write down the times when you take your pain medicine. It is easy to become confused while on pain medicine. Writing the time can help you avoid overdose. Take other over-the-counter or prescription medicines only as told by  your health care provider. Keeping yourself and others safe  While you are taking opioid pain medicine: Do not drive, use machinery, or power tools. Do not sign legal documents. Do not drink alcohol. Do not take sleeping pills. Do not supervise children by yourself. Do not do activities that require climbing or being in high places. Do not go to a lake, river, ocean, spa, or swimming pool. Do not share your pain medicine with anyone. Keep pain medicine in a locked cabinet or in a secure area where pets and children cannot reach it. Stopping your use of opioids If you have been taking opioid medicine for more than a few weeks, you may need to slowly decrease (taper) how much you take until you stop completely. Tapering your use of opioids can decrease your risk of symptoms of withdrawal, such as: Pain and cramping in the abdomen. Nausea. Sweating. Sleepiness. Restlessness. Uncontrollable shaking (tremors). Cravings for the medicine. Do not attempt to taper your use of opioids on your own. Talk with your health care provider about how to do this. Your health care provider may prescribe a step-down schedule based on how much medicine you are taking and how long you have been taking it. Getting rid of leftover pills Do not save any leftover pills. Get rid of leftover pills safely by: Taking the medicine to a prescription take-back program. This is usually offered by the county or law enforcement. Bringing them to a pharmacy that has a drug disposal container. Flushing them down the toilet. Check the label or package insert of your medicine to see whether this is safe to do. Throwing them out in the trash. Check the label or package insert of your medicine to see whether this is safe to do. If it is safe to throw it out, remove the medicine from the original container, put it into a sealable bag or container, and mix it with used coffee grounds, food scraps, dirt, or cat litter before putting  it in the trash. Follow these instructions at home: Activity Do exercises as told by your health care provider. Avoid activities that make your pain worse. Return to your normal activities as told by your health care provider. Ask your health care provider what activities are safe for you. General instructions You may need to take these actions to prevent or treat constipation: Drink enough fluid to keep your urine pale yellow. Take over-the-counter or prescription medicines. Eat foods that are high in fiber, such as beans, whole grains, and fresh fruits and vegetables. Limit foods that are high in fat and processed sugars, such as fried or sweet foods. Keep all follow-up visits. This is important. Where to find support If you have been taking opioids for a long time, you may benefit from receiving support for quitting from a local support group or counselor. Ask your health care provider for a referral to these resources in your area. Where to find more information Centers for Disease Control and Prevention (CDC): http://www.wolf.info/ [place] Food and Drug Administration (FDA): [place] Get help right away if: You may have taken too much of an opioid (  overdosed). Common symptoms of an overdose: Your breathing is slower or more shallow than normal. You have a very slow heartbeat (pulse). You have slurred speech. You have nausea and vomiting. Your pupils become very small. You have other potential symptoms: You are very confused. You faint or feel like you will faint. You have cold, clammy skin. You have blue lips or fingernails. You have thoughts of harming yourself or harming others. These symptoms may represent a serious problem that is an emergency. Do not wait to see if the symptoms will go away. Get medical help right away. Call your local emergency services (911 in the [place] Do not drive yourself to the hospital.  If you ever feel like you may hurt yourself or others, or have thoughts  about taking your own life, get help right away. Go to your nearest emergency department or: Call your local emergency services (911 in the [place] Call the [facility] 318-370-4239 in the [place] Call a suicide crisis helpline, such as the [place] at 952-318-7374 or 988 in the [place]. This is open 24 hours a day in the [place] Text the Crisis Text Line at 425-750-6198 (in the [place].). Summary Opioid medicines can help you manage moderate to severe pain for a short period of time. A pain treatment plan is an agreement between you and your health care provider. Discuss the goals of your treatment, including how much pain you might expect to have and how you will manage the pain. If you think that you or someone else may have taken too much of an opioid, get medical help right away. This information is not intended to replace advice given to you by your health care provider. Make sure you discuss any questions you have with your health care provider. Document Revised: 08/12/2020 Document Reviewed: 04/29/2020 Elsevier Patient Education  [place] directives: No   Conditions/risks identified: None  Next appointment: Follow up in one year for your annual wellness visit.    Preventive Care 10 Years and Older, Male Preventive care refers to lifestyle choices and visits with your health care provider that can promote health and wellness. What does preventive care include? A yearly physical exam. This is also called an annual well check. Dental exams once or twice a year. Routine eye exams. Ask your health care provider how often you should have your eyes checked. Personal lifestyle choices, including: Daily care of your teeth and gums. Regular physical activity. Eating a healthy diet. Avoiding tobacco and drug use. Limiting alcohol use. Practicing safe sex. Taking low doses of aspirin every day. Taking vitamin and mineral  supplements as recommended by your health care provider. What happens during an annual well check? The services and screenings done by your health care provider during your annual well check will depend on your age, overall health, lifestyle risk factors, and family history of disease. Counseling  Your health care provider may ask you questions about your: Alcohol use. Tobacco use. Drug use. Emotional well-being. Home and relationship well-being. Sexual activity. Eating habits. History of falls. Memory and ability to understand (cognition). Work and work Statistician. Screening  You may have the following tests or measurements: Height, weight, and BMI. Blood pressure. Lipid and cholesterol levels. These may be checked every 5 years, or more frequently if you are over 47 years old. Skin check. Lung cancer screening. You may have this screening every year starting at age 52 if you have a 30-pack-year  history of smoking and currently smoke or have quit within the past 15 years. Fecal occult blood test (FOBT) of the stool. You may have this test every year starting at age 71. Flexible sigmoidoscopy or colonoscopy. You may have a sigmoidoscopy every 5 years or a colonoscopy every 10 years starting at age 47. Prostate cancer screening. Recommendations will vary depending on your family history and other risks. Hepatitis C blood test. Hepatitis B blood test. Sexually transmitted disease (STD) testing. Diabetes screening. This is done by checking your blood sugar (glucose) after you have not eaten for a while (fasting). You may have this done every 1-3 years. Abdominal aortic aneurysm (AAA) screening. You may need this if you are a current or former smoker. Osteoporosis. You may be screened starting at age 55 if you are at high risk. Talk with your health care provider about your test results, treatment options, and if necessary, the need for more tests. Vaccines  Your health care provider  may recommend certain vaccines, such as: Influenza vaccine. This is recommended every year. Tetanus, diphtheria, and acellular pertussis (Tdap, Td) vaccine. You may need a Td booster every 10 years. Zoster vaccine. You may need this after age 26. Pneumococcal 13-valent conjugate (PCV13) vaccine. One dose is recommended after age 44. Pneumococcal polysaccharide (PPSV23) vaccine. One dose is recommended after age 44. Talk to your health care provider about which screenings and vaccines you need and how often you need them. This information is not intended to replace advice given to you by your health care provider. Make sure you discuss any questions you have with your health care provider. Document Released: 02/13/2015 Document Revised: 10/07/2015 Document Reviewed: 11/18/2014 Elsevier Interactive Patient Education  2017 [place] Prevention in the Home Falls can cause injuries. They can happen to people of all ages. There are many things you can do to make your home safe and to help prevent falls. What can I do on the outside of my home? Regularly fix the edges of walkways and driveways and fix any cracks. Remove anything that might make you trip as you walk through a door, such as a raised step or threshold. Trim any bushes or trees on the path to your home. Use bright outdoor lighting. Clear any walking paths of anything that might make someone trip, such as rocks or tools. Regularly check to see if handrails are loose or broken. Make sure that both sides of any steps have handrails. Any raised decks and porches should have guardrails on the edges. Have any leaves, snow, or ice cleared regularly. Use sand or salt on walking paths during winter. Clean up any spills in your garage right away. This includes oil or grease spills. What can I do in the bathroom? Use night lights. Install grab bars by the toilet and in the tub and shower. Do not use towel bars as grab bars. Use  non-skid mats or decals in the tub or shower. If you need to sit down in the shower, use a plastic, non-slip stool. Keep the floor dry. Clean up any water that spills on the floor as soon as it happens. Remove soap buildup in the tub or shower regularly. Attach bath mats securely with double-sided non-slip rug tape. Do not have throw rugs and other things on the floor that can make you trip. What can I do in the bedroom? Use night lights. Make sure that you have a light by your bed that is easy to reach. Do  not use any sheets or blankets that are too big for your bed. They should not hang down onto the floor. Have a firm chair that has side arms. You can use this for support while you get dressed. Do not have throw rugs and other things on the floor that can make you trip. What can I do in the kitchen? Clean up any spills right away. Avoid walking on wet floors. Keep items that you use a lot in easy-to-reach places. If you need to reach something above you, use a strong step stool that has a grab bar. Keep electrical cords out of the way. Do not use floor polish or wax that makes floors slippery. If you must use wax, use non-skid floor wax. Do not have throw rugs and other things on the floor that can make you trip. What can I do with my stairs? Do not leave any items on the stairs. Make sure that there are handrails on both sides of the stairs and use them. Fix handrails that are broken or loose. Make sure that handrails are as long as the stairways. Check any carpeting to make sure that it is firmly attached to the stairs. Fix any carpet that is loose or worn. Avoid having throw rugs at the top or bottom of the stairs. If you do have throw rugs, attach them to the floor with carpet tape. Make sure that you have a light switch at the top of the stairs and the bottom of the stairs. If you do not have them, ask someone to add them for you. What else can I do to help prevent falls? Wear  shoes that: Do not have high heels. Have rubber bottoms. Are comfortable and fit you well. Are closed at the toe. Do not wear sandals. If you use a stepladder: Make sure that it is fully opened. Do not climb a closed stepladder. Make sure that both sides of the stepladder are locked into place. Ask someone to hold it for you, if possible. Clearly mark and make sure that you can see: Any grab bars or handrails. First and last steps. Where the edge of each step is. Use tools that help you move around (mobility aids) if they are needed. These include: Canes. Walkers. Scooters. Crutches. Turn on the lights when you go into a dark area. Replace any light bulbs as soon as they burn out. Set up your furniture so you have a clear path. Avoid moving your furniture around. If any of your floors are uneven, fix them. If there are any pets around you, be aware of where they are. Review your medicines with your doctor. Some medicines can make you feel dizzy. This can increase your chance of falling. Ask your doctor what other things that you can do to help prevent falls. This information is not intended to replace advice given to you by your health care provider. Make sure you discuss any questions you have with your health care provider. Document Released: 11/13/2008 Document Revised: 06/25/2015 Document Reviewed: 02/21/2014 Elsevier Interactive Patient Education  2017 [employer].

## 2021-10-05 ENCOUNTER — Other Ambulatory Visit: Payer: Self-pay | Admitting: Family Medicine

## 2021-10-05 ENCOUNTER — Other Ambulatory Visit: Payer: Self-pay | Admitting: Internal Medicine

## 2021-10-05 DIAGNOSIS — I739 Peripheral vascular disease, unspecified: Secondary | ICD-10-CM

## 2021-10-05 DIAGNOSIS — I700 Atherosclerosis of aorta: Secondary | ICD-10-CM

## 2021-10-05 DIAGNOSIS — I10 Essential (primary) hypertension: Secondary | ICD-10-CM

## 2021-10-05 DIAGNOSIS — E785 Hyperlipidemia, unspecified: Secondary | ICD-10-CM

## 2022-02-08 ENCOUNTER — Other Ambulatory Visit: Payer: Self-pay | Admitting: Internal Medicine

## 2022-02-16 ENCOUNTER — Other Ambulatory Visit: Payer: Self-pay | Admitting: Internal Medicine

## 2022-02-16 ENCOUNTER — Other Ambulatory Visit: Payer: Self-pay | Admitting: Family Medicine

## 2022-02-16 DIAGNOSIS — I10 Essential (primary) hypertension: Secondary | ICD-10-CM

## 2022-03-03 ENCOUNTER — Telehealth: Payer: Self-pay | Admitting: Internal Medicine

## 2022-03-03 ENCOUNTER — Ambulatory Visit (INDEPENDENT_AMBULATORY_CARE_PROVIDER_SITE_OTHER): Payer: Medicare HMO | Admitting: Internal Medicine

## 2022-03-03 ENCOUNTER — Encounter: Payer: Self-pay | Admitting: Internal Medicine

## 2022-03-03 VITALS — BP 115/61 | HR 85 | Temp 97.6°F | Ht 71.0 in | Wt 143.6 lb

## 2022-03-03 DIAGNOSIS — M79605 Pain in left leg: Secondary | ICD-10-CM | POA: Diagnosis not present

## 2022-03-03 DIAGNOSIS — M79604 Pain in right leg: Secondary | ICD-10-CM

## 2022-03-03 DIAGNOSIS — Z23 Encounter for immunization: Secondary | ICD-10-CM

## 2022-03-03 DIAGNOSIS — R634 Abnormal weight loss: Secondary | ICD-10-CM

## 2022-03-03 DIAGNOSIS — E785 Hyperlipidemia, unspecified: Secondary | ICD-10-CM

## 2022-03-03 DIAGNOSIS — R52 Pain, unspecified: Secondary | ICD-10-CM

## 2022-03-03 DIAGNOSIS — Z8719 Personal history of other diseases of the digestive system: Secondary | ICD-10-CM

## 2022-03-03 DIAGNOSIS — Z981 Arthrodesis status: Secondary | ICD-10-CM | POA: Diagnosis not present

## 2022-03-03 DIAGNOSIS — I700 Atherosclerosis of aorta: Secondary | ICD-10-CM | POA: Diagnosis not present

## 2022-03-03 DIAGNOSIS — G4700 Insomnia, unspecified: Secondary | ICD-10-CM | POA: Diagnosis not present

## 2022-03-03 DIAGNOSIS — F1120 Opioid dependence, uncomplicated: Secondary | ICD-10-CM

## 2022-03-03 DIAGNOSIS — J438 Other emphysema: Secondary | ICD-10-CM

## 2022-03-03 DIAGNOSIS — R69 Illness, unspecified: Secondary | ICD-10-CM | POA: Diagnosis not present

## 2022-03-03 DIAGNOSIS — Z79899 Other long term (current) drug therapy: Secondary | ICD-10-CM | POA: Diagnosis not present

## 2022-03-03 HISTORY — DX: Personal history of other diseases of the digestive system: Z87.19

## 2022-03-03 HISTORY — DX: Other long term (current) drug therapy: Z79.899

## 2022-03-03 HISTORY — DX: Arthrodesis status: Z98.1

## 2022-03-03 HISTORY — DX: Pain, unspecified: R52

## 2022-03-03 MED ORDER — ZOLPIDEM TARTRATE 5 MG PO TABS: ORAL_TABLET | ORAL | 2 refills | Status: DC

## 2022-03-03 MED ORDER — ROSUVASTATIN CALCIUM 40 MG PO TABS: 40.0000 mg | ORAL_TABLET | Freq: Every day | ORAL | 3 refills | Status: DC

## 2022-03-03 NOTE — Assessment & Plan Note (Signed)
Encouraged smoke cessation

## 2022-03-03 NOTE — Assessment & Plan Note (Signed)
Follow up 1 month(s) May need to see Carlean Purl soon.

## 2022-03-03 NOTE — Telephone Encounter (Signed)
Pt was scheduled for a New Pt appt in [facility] office. This was supposed to be a TOC & messages sent to both providers. This was not completed. We apologize for the inconvenience. Pt was last seen by Dr. Martinique on 12/14/20. I spoke with Constance Holster at [place] office. I was given permission from the practice admin to continue with the appointment.

## 2022-03-03 NOTE — Assessment & Plan Note (Signed)
>>  ASSESSMENT AND PLAN FOR PULMONARY EMPHYSEMA ([facility]) WRITTEN ON 03/03/2022  1:11 PM BY Mykenna Viele G, MD  Encouraged smoke cessation

## 2022-03-03 NOTE — Assessment & Plan Note (Addendum)
Checked and ensured he is taking and statin- he is not on although atorvastatin listed Sent rosuvastatin with note to stop atorvastatin if still taking

## 2022-03-03 NOTE — Assessment & Plan Note (Signed)
Reviewed, added to problems He feels  this surgery went well and held up well.

## 2022-03-03 NOTE — Assessment & Plan Note (Signed)
Encouraged continuing with aspirin Trial rosuvastatin, not clear if on atorvastatin Goal LDL 55 due to peripheral arterial disease and aortic atherosclerosis  Encouraged smoking cessation

## 2022-03-03 NOTE — Assessment & Plan Note (Addendum)
Suspect(s) claudication  and neurogenic claudication Offered duplex he declined for now Continue with aspirin and try rosuvastatin.

## 2022-03-03 NOTE — Assessment & Plan Note (Signed)
Reviewed records.  We will do bloodwork next visit 1 month(s) but today focused on making sure he has adequate supply pain medicine to not go in withdrawal and next month(s) should be able to get through entire problem list.

## 2022-03-03 NOTE — Progress Notes (Addendum)
[place]  Phone: [phone]  New patient visit  Visit Date: 03/03/2022 Patient: Michael Bender   DOB: 06/30/1945   77 y.o. Male  MRN: [id number]  Today's healthcare provider: Loralee Pacas, MD  Assessment and Plan:  Addenda 03/22/22- ordered oxycodone upon patient request- reviewed bop at that time and noted he should be out.  This is bridging his chronic medication to pain management.   Jafari was seen today for new patient (initial visit) and pain management.  Intractable pain -     Ambulatory referral to Pain Clinic -     oxyCODONE HCl; Take 1 tablet (30 mg total) by mouth 3 (three) times daily.  Dispense: 90 tablet; Refill: 0  Need for shingles vaccine  History of fusion of cervical spine Overview: With persistent cervical pain and palpable screws Led to disability History of attempt to dig furrow in skull to fix it Last surgery 1992  Orders: -     oxyCODONE HCl; Take 1 tablet (30 mg total) by mouth 3 (three) times daily.  Dispense: 90 tablet; Refill: 0  History of lumbar fusion Overview: Dr. Vertell Limber 03/2011 PRE-OPERATIVE DIAGNOSIS:  lumbar stenosis synovial cyst lumbar spondylosis spondylolisthesis lumbar radiculoapthy L4/5   PROCEDURE:  Procedure(s): POSTERIOR LUMBAR FUSION 1 LEVEL with resection of synovial cyst    Assessment & Plan: Reviewed, added to problems He feels  this surgery went well and held up well.   Atherosclerosis of aorta ([place]) Overview: Chest CTA 07/2019:1. No evidence of pulmonary embolus. 2. Aortic Atherosclerosis (ICD10-I70.0).  Assessment & Plan: Checked and ensured he is taking and statin- he is not on although atorvastatin listed Sent rosuvastatin with note to stop atorvastatin if still taking   Orders: -     Rosuvastatin Calcium; Take 1 tablet (40 mg total) by mouth daily. Replaces atorvastatin (stop atorvastatin if still taking)  Dispense: 90 tablet; Refill: 3  Bilateral leg pain Assessment &  Plan: Suspect(s) claudication  and neurogenic claudication Offered duplex he declined for now Continue with aspirin and try rosuvastatin.   Chronic narcotic dependence (HCC) Overview: 30 mg oxycodone three times daily in last month(s) by his Primary Care Provider (PCP) Dr. Suzanna Obey until 01/2022 who suddenly retired and we don't have record(s) but we do have prescription drug monitoring program confirmation that this is a chronic medication      Assessment & Plan: He feels oxycodone 30 three times daily working well but its not giving good pain control for a full 8 hours as prescribed Its 135 mme with Ambien- so its beyond the range I'm comfortable managing, but since he was getting from Primary Care Provider (PCP) who has retired and has no pain management set up, I will continue it 1 month(s) at a time until he can see pain specialist The benefits of continuing opioid therapy outweigh the risks and chronic opioids will be continued.  30  day supply was e-scribed to pharmacy.   Ongoing education about safe opioid treatment provided encouraged taper off Ambien and he will try  and not tell anyone he has these meds  UDS today     [x]$  YES  []$   NO.  Prescribing contract within last year: [x]$  YES  []$   NO.  Created today will be scanned PDMP reviewed today:    [x]$ YES   []$   NO.     High risk medication use -     DRUG MONITORING, PANEL 8 WITH CONFIRMATION, URINE  Insomnia, unspecified type -  Zolpidem Tartrate; SMARTSIG:1 Tablet(s) By Mouth PRN PRN  Dispense: 30 tablet; Refill: 2  History of upper gastrointestinal bleeding Overview: Duodenal ulcer 2021 Dr. Carlean Purl  Assessment & Plan: Reviewed records.  We will do bloodwork next visit 1 month(s) but today focused on making sure he has adequate supply pain medicine to not go in withdrawal and next month(s) should be able to get through entire problem list.   Loss of weight Overview: Wt Readings from Last 10 Encounters:   03/03/22 143 lb 9.6 oz (65.1 kg)  07/14/21 153 lb (69.4 kg)  12/14/20 147 lb 12.8 oz (67 kg)  11/13/19 154 lb (69.9 kg)  09/16/19 146 lb (66.2 kg)  08/06/19 146 lb (66.2 kg)  07/19/19 144 lb 13.5 oz (65.7 kg)  07/17/19 148 lb (67.1 kg)  07/09/19 147 lb 9.6 oz (67 kg)  03/08/19 162 lb (73.5 kg)     Assessment & Plan: Follow up 1 month(s) May need to see Carlean Purl soon.   Hyperlipidemia, unspecified hyperlipidemia type Overview: Lipid Panel     Component Value Date/Time   CHOL 134 12/14/2020 1305   TRIG 188.0 (H) 12/14/2020 1305   HDL 45.70 12/14/2020 1305   CHOLHDL 3 12/14/2020 1305   VLDL 37.6 12/14/2020 1305   LDLCALC 51 12/14/2020 1305   LDLDIRECT 102.0 09/08/2015 1343     Assessment & Plan: Encouraged continuing with aspirin Trial rosuvastatin, not clear if on atorvastatin Goal LDL 55 due to peripheral arterial disease and aortic atherosclerosis  Encouraged smoking cessation   Other emphysema (HCC) Overview: Per CT angiogram 07/2019: There is upper lobe predominant emphysema  He has heavy smoking history   Assessment & Plan: Encouraged smoke cessation      Physician Time-Spent:  48 minutes of total time was spent on the date of this encounter performing the following actions: chart review during and after seeing the patient, obtaining history from the patient and his son, counseling on the treatment plan, discussing risks and benefits of continued high dose opioid with Ambien,  placing orders, and documenting in our EHR.    This extended time spent was medically necessary because he is complex patient that is new to me on high risk medications.     Subjective:  Patient presents today to establish care.  Chief Complaint  Patient presents with   New Patient (Initial Visit)   Pain Management    Problem-oriented charting was used to develop and update his medical history: Problem  History of Lumbar Fusion   Dr. Vertell Limber 03/2011 PRE-OPERATIVE DIAGNOSIS:   lumbar stenosis synovial cyst lumbar spondylosis spondylolisthesis lumbar radiculoapthy L4/5   PROCEDURE:  Procedure(s): POSTERIOR LUMBAR FUSION 1 LEVEL with resection of synovial cyst     History of Upper Gastrointestinal Bleeding   Duodenal ulcer 2021 Dr. Carlean Purl   Chronic Narcotic Dependence (Hcc)   30 mg oxycodone three times daily in last month(s) by his Primary Care Provider (PCP) Dr. Suzanna Obey until 01/2022 who suddenly retired and we don't have record(s) but we do have prescription drug monitoring program confirmation that this is a chronic medication       Atherosclerosis of Aorta (Hcc)   Chest CTA 07/2019:1. No evidence of pulmonary embolus. 2. Aortic Atherosclerosis (ICD10-I70.0).   Hld (Hyperlipidemia)   Lipid Panel     Component Value Date/Time   CHOL 134 12/14/2020 1305   TRIG 188.0 (H) 12/14/2020 1305   HDL 45.70 12/14/2020 1305   CHOLHDL 3 12/14/2020 1305   VLDL 37.6 12/14/2020 1305  LDLCALC 51 12/14/2020 1305   LDLDIRECT 102.0 09/08/2015 1343      Loss of Weight   Wt Readings from Last 10 Encounters:  03/03/22 143 lb 9.6 oz (65.1 kg)  07/14/21 153 lb (69.4 kg)  12/14/20 147 lb 12.8 oz (67 kg)  11/13/19 154 lb (69.9 kg)  09/16/19 146 lb (66.2 kg)  08/06/19 146 lb (66.2 kg)  07/19/19 144 lb 13.5 oz (65.7 kg)  07/17/19 148 lb (67.1 kg)  07/09/19 147 lb 9.6 oz (67 kg)  03/08/19 162 lb (73.5 kg)      Bilateral Leg Pain  Pulmonary Emphysema (Hcc)   Per CT angiogram 07/2019: There is upper lobe predominant emphysema  He has heavy smoking history       Depression Screen    07/14/2021   11:07 AM 12/14/2020    1:48 PM 07/08/2020   11:08 AM 04/09/2014    9:28 AM  PHQ 2/9 Scores  PHQ - 2 Score 0 0 0 0   No results found for any visits on 03/03/22.  The following were reviewed and entered/updated into his MEDICAL RECORD NUMBERPast Medical History:  Diagnosis Date   Arthritis    Back   Blood transfusion    as a child   Chronic back pain    COPD  (chronic obstructive pulmonary disease) ([place])    Essential hypertension 03/05/2007   Qualifier: Diagnosis of   By: Sherren Mocha MD, Martie Lee)    last one 6 months ago   Hemorrhoids, internal    History of lumbar fusion 03/03/2022   RE-OPERATIVE DIAGNOSIS:  lumbar stenosis synovial cyst lumbar spondylosis spondylolisthesis lumbar radiculoapthy L4/5   PROCEDURE:  Procedure(s): POSTERIOR LUMBAR FUSION 1 LEVEL with resection of synovial cyst     History of upper gastrointestinal bleeding 03/03/2022   Duodenal ulcer 2021 Dr. Carlean Purl   HLD (hyperlipidemia)    Hypertension    Neck rigidity    post cervical fusion   Nocturia    Pneumonia    Prostate disease    S/P cervical spinal fusion 03/03/2022   With persistent cervical pain and palpable screws Led to disability History of attempt to dig furrow in skull to fix it Last surgery 1992   Past Surgical History:  Procedure Laterality Date   BIOPSY  07/19/2019   Procedure: BIOPSY;  Surgeon: Lavena Bullion, DO;  Location: [facility];  Service: Gastroenterology;;   CERVICAL FUSION  1992   C2/C 3  four surgeries   COLONOSCOPY     ESOPHAGOGASTRODUODENOSCOPY  07/20/2011   Procedure: ESOPHAGOGASTRODUODENOSCOPY (EGD);  Surgeon: Gatha Mayer, MD;  Location: [facility];  Service: Endoscopy;  Laterality: N/A;   ESOPHAGOGASTRODUODENOSCOPY (EGD) WITH PROPOFOL N/A 07/19/2019   Procedure: ESOPHAGOGASTRODUODENOSCOPY (EGD) WITH PROPOFOL;  Surgeon: Lavena Bullion, DO;  Location: [facility];  Service: Gastroenterology;  Laterality: N/A;   SAVORY DILATION  07/20/2011   Procedure: SAVORY DILATION;  Surgeon: Gatha Mayer, MD;  Location: [facility];  Service: Endoscopy;  Laterality: N/A;   SPINAL FUSION  05/06/11   Family History  Problem Relation Age of Onset   Heart disease Mother    Heart attack Mother 49   Pancreatic cancer Father 31   Pancreatic cancer Brother    Anesthesia problems Neg Hx    Colon cancer Neg Hx     Liver cancer Neg Hx    Stomach cancer Neg Hx    Esophageal cancer Neg Hx    Rectal cancer Neg Hx  Outpatient Medications Prior to Visit  Medication Sig Dispense Refill   acetaminophen (TYLENOL) 325 MG tablet Take 2 tablets (650 mg total) by mouth every 6 (six) hours as needed for mild pain (or Fever >/= 101).     amitriptyline (ELAVIL) 50 MG tablet Take 50 mg by mouth in the morning, at noon, and at bedtime.      amLODipine (NORVASC) 5 MG tablet TAKE 1 TABLET BY MOUTH EVERYDAY AT BEDTIME 90 tablet 0   cyclobenzaprine (FLEXERIL) 10 MG tablet Take 10 mg by mouth 3 (three) times daily.     polyethylene glycol (MIRALAX / GLYCOLAX) 17 g packet Take 34 g by mouth daily.      oxycodone (ROXICODONE) 30 MG immediate release tablet Take 30 mg by mouth 3 (three) times daily.      zolpidem (AMBIEN) 5 MG tablet SMARTSIG:1 Tablet(s) By Mouth PRN PRN     albuterol (VENTOLIN HFA) 108 (90 Base) MCG/ACT inhaler Inhale 2 puffs into the lungs every 6 (six) hours as needed for wheezing or shortness of breath. (Patient not taking: Reported on 03/03/2022) 8 g 1   mupirocin ointment (BACTROBAN) 2 % APPLY TO AFFECTED AREA 3 TIMES A DAY (Patient not taking: Reported on 03/03/2022)     sucralfate (CARAFATE) 1 GM/10ML suspension Take 10 mLs (1 g total) by mouth 4 (four) times daily -  with meals and at bedtime. (Patient not taking: Reported on 03/03/2022) 1200 mL 0   SYMBICORT 160-4.5 MCG/ACT inhaler INHALE 2 PUFFS INTO THE LUNGS TWICE A DAY (Patient not taking: Reported on 03/03/2022) 10.2 each 3   atorvastatin (LIPITOR) 40 MG tablet TAKE 1 TABLET BY MOUTH EVERY DAY (Patient not taking: Reported on 03/03/2022) 90 tablet 1   pantoprazole (PROTONIX) 40 MG tablet TAKE 1 TABLET BY MOUTH TWICE A DAY (Patient not taking: Reported on 03/03/2022) 180 tablet 0   No facility-administered medications prior to visit.    Allergies  Allergen Reactions   Nubain [Nalbuphine Hcl]     Muscle contraction   Social History   Tobacco Use    Smoking status: Former    Years: 40.00    Types: Cigarettes    Quit date: 12/12/2018    Years since quitting: 3.2   Smokeless tobacco: Never  Vaping Use   Vaping Use: Never used  Substance Use Topics   Alcohol use: Not Currently    Alcohol/week: 2.0 standard drinks of alcohol    Types: 2 Shots of liquor per week   Drug use: No    Immunization History  Administered Date(s) Administered   Fluad Quad(high Dose 65+) 12/14/2020   Influenza, High Dose Seasonal PF 09/19/2018   Pneumococcal Conjugate-13 04/09/2014, 12/25/2017   Td 10/12/2004, 01/31/2013    Objective:  BP 115/61 (BP Location: Right Arm, Patient Position: Sitting)   Pulse 85   Temp 97.6 F (36.4 C) (Temporal)   Ht 5' 11"$  (1.803 m)   Wt 143 lb 9.6 oz (65.1 kg)   SpO2 92%   BMI 20.03 kg/m  Body mass index is 20.03 kg/m. indicates this is an Well developed, well nourished male , but waist circumference is a better indicator of healthy body composition. Physical Exam  Vital signs reviewed.  Nursing notes reviewed. General Appearance/Constitutional:  polite male in no acute distress Musculoskeletal: All extremities are intact.  Neurological:  Awake, alert,  No obvious focal neurological deficits or cognitive impairments Psychiatric:  Appropriate mood, pleasant demeanor Problem-specific findings:  chronically ill appearing.    Results Reviewed:  Results for orders placed or performed in visit on 12/14/20  Lipid panel  Result Value Ref Range   Cholesterol 134 0 - 200 mg/dL   Triglycerides 188.0 (H) 0.0 - 149.0 mg/dL   HDL 45.70 >39.00 mg/dL   VLDL 37.6 0.0 - 40.0 mg/dL   LDL Cholesterol 51 0 - 99 mg/dL   Total CHOL/HDL Ratio 3    NonHDL 88.33   Comprehensive metabolic panel  Result Value Ref Range   Sodium 136 135 - 145 mEq/L   Potassium 3.7 3.5 - 5.1 mEq/L   Chloride 101 96 - 112 mEq/L   CO2 27 19 - 32 mEq/L   Glucose, Bld 157 (H) 70 - 99 mg/dL   BUN 16 6 - 23 mg/dL   Creatinine, Ser 0.94 0.40 - 1.50  mg/dL   Total Bilirubin 0.3 0.2 - 1.2 mg/dL   Alkaline Phosphatase 88 39 - 117 U/L   AST 17 0 - 37 U/L   ALT 5 0 - 53 U/L   Total Protein 7.2 6.0 - 8.3 g/dL   Albumin 4.0 3.5 - 5.2 g/dL   GFR 78.99 >60.00 mL/min   Calcium 8.8 8.4 - 10.5 mg/dL  Hepatitis C antibody  Result Value Ref Range   Hepatitis C Ab NON-REACTIVE NON-REACTIVE   SIGNAL TO CUT-OFF 0.03 <1.00

## 2022-03-03 NOTE — Assessment & Plan Note (Addendum)
He feels oxycodone 30 three times daily working well but its not giving good pain control for a full 8 hours as prescribed Its 135 mme with Ambien- so its beyond the range I'm comfortable managing, but since he was getting from Primary Care Provider (PCP) who has retired and has no pain management set up, I will continue it 1 month(s) at a time until he can see pain specialist The benefits of continuing opioid therapy outweigh the risks and chronic opioids will be continued.  30  day supply was e-scribed to pharmacy.   Ongoing education about safe opioid treatment provided encouraged taper off Ambien and he will try  and not tell anyone he has these meds  UDS today     '[x]'$  YES  '[]'$   NO.  Prescribing contract within last year: '[x]'$  YES  '[]'$   NO.  Created today will be scanned PDMP reviewed today:    '[x]'$ YES   '[]'$   NO.

## 2022-03-17 ENCOUNTER — Other Ambulatory Visit: Payer: Self-pay | Admitting: Family Medicine

## 2022-03-17 ENCOUNTER — Other Ambulatory Visit: Payer: Self-pay | Admitting: Internal Medicine

## 2022-03-17 DIAGNOSIS — I739 Peripheral vascular disease, unspecified: Secondary | ICD-10-CM

## 2022-03-17 DIAGNOSIS — E785 Hyperlipidemia, unspecified: Secondary | ICD-10-CM

## 2022-03-17 DIAGNOSIS — I700 Atherosclerosis of aorta: Secondary | ICD-10-CM

## 2022-03-17 NOTE — Telephone Encounter (Signed)
I called and spoke to [place]. He will have his son call and set him up an appointment. He is taking his pantoprazole once daily. Refill sent in. In his last office visit note it stated he is Not to stop the pantoprazole.

## 2022-03-18 ENCOUNTER — Telehealth: Payer: Self-pay | Admitting: Internal Medicine

## 2022-03-18 NOTE — Telephone Encounter (Signed)
  Encourage patient to contact the pharmacy for refills or they can request refills through [facility]:  Please schedule appointment if longer than 1 year  NEXT APPOINTMENT DATE:  MEDICATION:  oxycodone (ROXICODONE) 30 MG immediate release tablet   Is the patient out of medication? Almost  PHARMACY:  [id number] [place] [address] Phone: [phone] Fax: [phone]     Let patient know to contact pharmacy at the end of the day to make sure medication is ready.  Please notify patient to allow 48-72 hours to process

## 2022-03-19 ENCOUNTER — Other Ambulatory Visit: Payer: Self-pay | Admitting: Family Medicine

## 2022-03-19 DIAGNOSIS — I10 Essential (primary) hypertension: Secondary | ICD-10-CM

## 2022-03-22 MED ORDER — OXYCODONE HCL 30 MG PO TABS: 30.0000 mg | ORAL_TABLET | Freq: Three times a day (TID) | ORAL | 0 refills | Status: DC

## 2022-03-22 NOTE — Addendum Note (Signed)
Addended by: Loralee Pacas on: 03/22/2022 06:06 PM   Modules accepted: Orders

## 2022-03-24 NOTE — Telephone Encounter (Signed)
Spoke with patient's son and he confirmed that he did get the medication. Also, that he is aware of the importance of his father keeping his appointments in order to continue getting this medication.

## 2022-04-05 ENCOUNTER — Ambulatory Visit (INDEPENDENT_AMBULATORY_CARE_PROVIDER_SITE_OTHER): Payer: Medicare HMO | Admitting: Internal Medicine

## 2022-04-05 ENCOUNTER — Encounter: Payer: Self-pay | Admitting: Internal Medicine

## 2022-04-05 VITALS — BP 134/72 | HR 88 | Temp 97.6°F | Ht 71.0 in | Wt 146.0 lb

## 2022-04-05 DIAGNOSIS — R52 Pain, unspecified: Secondary | ICD-10-CM | POA: Diagnosis not present

## 2022-04-05 DIAGNOSIS — G4700 Insomnia, unspecified: Secondary | ICD-10-CM | POA: Diagnosis not present

## 2022-04-05 DIAGNOSIS — N62 Hypertrophy of breast: Secondary | ICD-10-CM

## 2022-04-05 DIAGNOSIS — R69 Illness, unspecified: Secondary | ICD-10-CM | POA: Diagnosis not present

## 2022-04-05 DIAGNOSIS — F1120 Opioid dependence, uncomplicated: Secondary | ICD-10-CM

## 2022-04-05 DIAGNOSIS — Z981 Arthrodesis status: Secondary | ICD-10-CM

## 2022-04-05 DIAGNOSIS — Z79899 Other long term (current) drug therapy: Secondary | ICD-10-CM

## 2022-04-05 DIAGNOSIS — Z23 Encounter for immunization: Secondary | ICD-10-CM

## 2022-04-05 MED ORDER — RAMELTEON 8 MG PO TABS: 8.0000 mg | ORAL_TABLET | Freq: Every day | ORAL | 2 refills | Status: DC

## 2022-04-05 MED ORDER — OXYCODONE HCL 30 MG PO TABS: 30.0000 mg | ORAL_TABLET | Freq: Three times a day (TID) | ORAL | 0 refills | Status: DC

## 2022-04-05 NOTE — Assessment & Plan Note (Addendum)
Based on the way it feels im pretty sure that it is gynecomastia and due to a ratio problem between testosterone and estrogen in the bloodstream.  I cannot see any medicine I would blame.  We can keep an eye on it after discussing and offering a mammogram to double check and make sure that it is not breast cancer which would be extraordinarily rare in a male at his age

## 2022-04-05 NOTE — Assessment & Plan Note (Addendum)
Encouraged continuing with the oxycodone and securing it and seeing pain management soon. Due to pain management won't see until April I will continue dose unchanged until then as bridge.  PDMP reviewed during this encounter.

## 2022-04-05 NOTE — Assessment & Plan Note (Signed)
Encouraged continuing with abstaining from Ambien and warned it is dangerous to mix with oxycodone Sending ramelteon in case he gets recurrence of insomnia.

## 2022-04-05 NOTE — Progress Notes (Signed)
[facility]: [id number]   Routine Medical Office Visit  Patient:  Michael Bender      Age: 77 y.o.       Sex:  male  Date:   04/05/2022  PCP:    Loralee Pacas, [place] Provider: Loralee Pacas, [place]   Assessment and Plan:   Trek was seen today for 1 month follow-up.  Intractable pain -     oxyCODONE HCl; Take 1 tablet (30 mg total) by mouth 3 (three) times daily.  Dispense: 90 tablet; Refill: 0  Gynecomastia, male Overview: He noticed a swelling under his left nipple in 2023 I cannot say that it is grown much he is not on any medications that would cause gynecomastia drink much alcohol has no history of problems with liver On exam glandular nodularity under left nipple consistent with gynecomastia, but no enlargement of breast tissue in general. Declined further workup at 04/05/22 visit, will monitor  Assessment & Plan: Based on the way it feels im pretty sure that it is gynecomastia and due to a ratio problem between testosterone and estrogen in the bloodstream.  I cannot see any medicine I would blame.  We can keep an eye on it after discussing and offering a mammogram to double check and make sure that it is not breast cancer which would be extraordinarily rare in a male at his age   High risk medication use Overview: Indication for controlled substance: severe chronic intractable neck pain, bridging therapy for pending pain management specialist. Medication and dose:  oxycodone 30, see associated problem and med list # pills per month: 90 Last UDS date: ordered 03/03/22- yet to be completed Controlled substance contract signed (Y/N): Yes, scanned 03/09/22, completed 03/03/22 PDMP last reviewed (include red flags): PDMP reviewed during this encounter.  Diversion Prevention:  warned of risk of discontinuation with legal or substance use issues, signed contract, intermittent random drug screens and pill counts Morphine equivalents:  135  mme/day - explained this is likely to be reduced at pain management.      Insomnia, unspecified type Overview: Patient tapered Ambien January to February 2024 Patient reports minimal insomnia after stopping  Assessment & Plan: Encouraged continuing with abstaining from Ambien and warned it is dangerous to mix with oxycodone Sending ramelteon in case he gets recurrence of insomnia.  Orders: -     Ramelteon; Take 1 tablet (8 mg total) by mouth at bedtime.  Dispense: 30 tablet; Refill: 2  Chronic narcotic dependence (HCC) Overview: 30 mg oxycodone three times daily in last month(s) by his Primary Care Provider (PCP) Dr. Suzanna Obey until 01/2022 who suddenly retired and we don't have record(s) but we do have prescription drug monitoring program confirmation that this is a chronic medication  Patient reports on this many many years- associated with remote history cervical fusion.     Assessment & Plan: Encouraged continuing with the oxycodone and securing it and seeing pain management soon. Due to pain management won't see until April I will continue dose unchanged until then as bridge.  PDMP reviewed during this encounter.    Need for shingles vaccine -     Varicella-zoster vaccine IM  History of fusion of cervical spine Overview: With persistent cervical pain and palpable screws Led to disability History of attempt to dig furrow in skull to fix it Last surgery 1992  Orders: -     oxyCODONE HCl; Take 1 tablet (30 mg total) by mouth 3 (  three) times daily.  Dispense: 90 tablet; Refill: 0        Clinical Presentation:   The patient is a 77 y.o. male: Active Ambulatory Problems    Diagnosis Date Noted   Extrinsic asthma, unspecified 02/23/2009   Pulmonary emphysema ([place]) 03/26/2007   GERD 04/04/2007   CONSTIPATION, CHRONIC 10/07/2008   [place] DISEASE, LUMBAR 01/01/2009   Bilateral leg pain 05/03/2010   Esophageal dysphagia 07/15/2011   Disturbance of skin  sensation 05/22/2018   Right sided temporal headache 05/22/2018   Gastrointestinal hemorrhage with melena 11/20/2018   Insomnia 11/20/2018   PAD (peripheral artery disease) ([place]) 12/11/2018   Senile ecchymosis 01/21/2019   History of CVA (cerebrovascular accident) 02/18/2019   HLD (hyperlipidemia) 07/18/2019   COPD (chronic obstructive pulmonary disease) ([place]) 07/18/2019   Iron deficiency anemia    History of duodenal ulcer    Duodenal stenosis    Chronic narcotic dependence ([place]) 12/14/2020   Atherosclerosis of aorta ([place]) 12/14/2020   History of fusion of cervical spine 03/03/2022   History of lumbar fusion 03/03/2022   High risk medication use 03/03/2022   Intractable pain 03/03/2022   History of upper gastrointestinal bleeding 03/03/2022   Gynecomastia, male 04/05/2022   Resolved Ambulatory Problems    Diagnosis Date Noted   PAIN, CHRONIC NEC 10/06/2006   OTITIS EXTERNA 01/04/2007   Cellulitis and abscess of oral soft tissues 04/20/2007   DYSPNEA 03/26/2007   Essential hypertension 03/05/2007   Symptomatic anemia 07/17/2019   Hypertensive urgency 07/18/2019   AKI (acute kidney injury) ([place]) 07/18/2019   GIB (gastrointestinal bleeding) 07/18/2019   Loss of weight    Gastritis and gastroduodenitis    Gastric erosion    Past Medical History:  Diagnosis Date   Arthritis    Blood transfusion    Chronic back pain    Headache(784.0)    Hemorrhoids, internal    Hypertension    Neck rigidity    Nocturia    Pneumonia    Prostate disease    S/P cervical spinal fusion 03/03/2022    Outpatient Medications Prior to Visit  Medication Sig   acetaminophen (TYLENOL) 325 MG tablet Take 2 tablets (650 mg total) by mouth every 6 (six) hours as needed for mild pain (or Fever >/= 101).   albuterol (VENTOLIN HFA) 108 (90 Base) MCG/ACT inhaler Inhale 2 puffs into the lungs every 6 (six) hours as needed for wheezing or shortness of breath.   amitriptyline (ELAVIL) 50 MG tablet Take 50  mg by mouth in the morning, at noon, and at bedtime.    amLODipine (NORVASC) 5 MG tablet TAKE 1 TABLET BY MOUTH EVERYDAY AT BEDTIME   cyclobenzaprine (FLEXERIL) 10 MG tablet Take 10 mg by mouth 3 (three) times daily.   mupirocin ointment (BACTROBAN) 2 %    pantoprazole (PROTONIX) 40 MG tablet Take 1 tablet (40 mg total) by mouth daily before breakfast.   polyethylene glycol (MIRALAX / GLYCOLAX) 17 g packet Take 34 g by mouth daily.    rosuvastatin (CRESTOR) 40 MG tablet Take 1 tablet (40 mg total) by mouth daily. Replaces atorvastatin (stop atorvastatin if still taking)   sucralfate (CARAFATE) 1 GM/10ML suspension Take 10 mLs (1 g total) by mouth 4 (four) times daily -  with meals and at bedtime.   SYMBICORT 160-4.5 MCG/ACT inhaler INHALE 2 PUFFS INTO THE LUNGS TWICE A DAY   [DISCONTINUED] oxycodone (ROXICODONE) 30 MG immediate release tablet Take 1 tablet (30 mg total) by mouth 3 (three) times daily.   [  DISCONTINUED] zolpidem (AMBIEN) 5 MG tablet SMARTSIG:1 Tablet(s) By Mouth PRN PRN   No facility-administered medications prior to visit.     Chief Complaint  Patient presents with   1 month follow-up    HPI  Oxycodone working ok, won't see pain management until April Was able to stop Ambien with no insomnia now even without Concerned about a lump under left nipple noticed a year ago            Clinical Data Analysis:  Physical Exam  BP 134/72 (BP Location: Right Arm, Patient Position: Sitting)   Pulse 88   Temp 97.6 F (36.4 C) (Temporal)   Ht '5\' 11"'$  (1.803 m)   Wt 146 lb (66.2 kg)   SpO2 94%   BMI 20.36 kg/m  Wt Readings from Last 10 Encounters:  04/05/22 146 lb (66.2 kg)  03/03/22 143 lb 9.6 oz (65.1 kg)  07/14/21 153 lb (69.4 kg)  12/14/20 147 lb 12.8 oz (67 kg)  11/13/19 154 lb (69.9 kg)  09/16/19 146 lb (66.2 kg)  08/06/19 146 lb (66.2 kg)  07/19/19 144 lb 13.5 oz (65.7 kg)  07/17/19 148 lb (67.1 kg)  07/09/19 147 lb 9.6 oz (67 kg)   Vital signs reviewed.   Nursing notes reviewed. Weight trend reviewed. Abnormalities noted: none Well developed, well nourished  by BMI criteria is noted but in my medical opinion, BMI is an unreliable indicator of healthy body composition due to its inability to reflect lean muscle mass.  He has some truncal adiposity. General Appearance:  Well developed, well nourished male in no acute distress.   Pulmonary:  Normal work of breathing at rest, no respiratory distress apparent. SpO2: 94 %  Musculoskeletal: All extremities are intact.  Neurological:  Awake, alert. No obvious focal neurological deficits or cognitive impairments.  Sensorium seems unclouded. Psychiatric:  Appropriate mood, pleasant demeanor Problem-specific findings:  glandular nodularity under left nipple consistent with gynecomastia, but no enlargement of breast tissue in general.       Signed: Loralee Pacas, MD 04/05/2022 1:42 PM

## 2022-04-05 NOTE — Assessment & Plan Note (Signed)
Reviewed, weight stable will resolve and continue monitoring at future visits.

## 2022-04-08 ENCOUNTER — Other Ambulatory Visit: Payer: Self-pay | Admitting: Family Medicine

## 2022-04-08 DIAGNOSIS — I10 Essential (primary) hypertension: Secondary | ICD-10-CM

## 2022-04-08 DIAGNOSIS — I739 Peripheral vascular disease, unspecified: Secondary | ICD-10-CM

## 2022-04-08 DIAGNOSIS — I700 Atherosclerosis of aorta: Secondary | ICD-10-CM

## 2022-04-08 DIAGNOSIS — E785 Hyperlipidemia, unspecified: Secondary | ICD-10-CM

## 2022-05-03 ENCOUNTER — Encounter: Payer: Self-pay | Admitting: Internal Medicine

## 2022-05-03 ENCOUNTER — Ambulatory Visit (INDEPENDENT_AMBULATORY_CARE_PROVIDER_SITE_OTHER): Payer: Medicare HMO | Admitting: Internal Medicine

## 2022-05-03 VITALS — BP 128/74 | HR 98 | Temp 97.7°F | Ht 71.0 in | Wt 142.8 lb

## 2022-05-03 DIAGNOSIS — Z981 Arthrodesis status: Secondary | ICD-10-CM | POA: Diagnosis not present

## 2022-05-03 DIAGNOSIS — R636 Underweight: Secondary | ICD-10-CM

## 2022-05-03 DIAGNOSIS — M79605 Pain in left leg: Secondary | ICD-10-CM

## 2022-05-03 DIAGNOSIS — G8929 Other chronic pain: Secondary | ICD-10-CM

## 2022-05-03 DIAGNOSIS — Z79899 Other long term (current) drug therapy: Secondary | ICD-10-CM | POA: Diagnosis not present

## 2022-05-03 DIAGNOSIS — K5909 Other constipation: Secondary | ICD-10-CM

## 2022-05-03 DIAGNOSIS — M5137 Other intervertebral disc degeneration, lumbosacral region: Secondary | ICD-10-CM | POA: Diagnosis not present

## 2022-05-03 DIAGNOSIS — K219 Gastro-esophageal reflux disease without esophagitis: Secondary | ICD-10-CM

## 2022-05-03 DIAGNOSIS — M79604 Pain in right leg: Secondary | ICD-10-CM

## 2022-05-03 DIAGNOSIS — Z119 Encounter for screening for infectious and parasitic diseases, unspecified: Secondary | ICD-10-CM | POA: Diagnosis not present

## 2022-05-03 DIAGNOSIS — J438 Other emphysema: Secondary | ICD-10-CM | POA: Diagnosis not present

## 2022-05-03 DIAGNOSIS — M5442 Lumbago with sciatica, left side: Secondary | ICD-10-CM | POA: Diagnosis not present

## 2022-05-03 DIAGNOSIS — M5441 Lumbago with sciatica, right side: Secondary | ICD-10-CM | POA: Diagnosis not present

## 2022-05-03 DIAGNOSIS — G43E11 Chronic migraine with aura, intractable, with status migrainosus: Secondary | ICD-10-CM

## 2022-05-03 DIAGNOSIS — R52 Pain, unspecified: Secondary | ICD-10-CM

## 2022-05-03 DIAGNOSIS — K315 Obstruction of duodenum: Secondary | ICD-10-CM

## 2022-05-03 DIAGNOSIS — Z23 Encounter for immunization: Secondary | ICD-10-CM

## 2022-05-03 DIAGNOSIS — R64 Cachexia: Secondary | ICD-10-CM

## 2022-05-03 HISTORY — DX: Underweight: R63.6

## 2022-05-03 HISTORY — DX: Cachexia: R64

## 2022-05-03 LAB — MICROALBUMIN / CREATININE URINE RATIO
Creatinine,U: 33 mg/dL
Microalb Creat Ratio: 16.2 mg/g (ref 0.0–30.0)
Microalb, Ur: 5.4 mg/dL — ABNORMAL HIGH (ref 0.0–1.9)

## 2022-05-03 LAB — LIPID PANEL
Cholesterol: 93 mg/dL (ref 0–200)
HDL: 56.3 mg/dL (ref >39.00)
LDL Cholesterol: 13 mg/dL (ref 0–99)
NonHDL: 36.54
Total CHOL/HDL Ratio: 2
Triglycerides: 119 mg/dL (ref 0.0–149.0)
VLDL: 23.8 mg/dL (ref 0.0–40.0)

## 2022-05-03 LAB — TSH: TSH: 1.88 u[IU]/mL (ref 0.35–5.50)

## 2022-05-03 LAB — CBC WITH DIFFERENTIAL/PLATELET
Basophils Absolute: 0 10*3/uL (ref 0.0–0.1)
Basophils Relative: 0.5 % (ref 0.0–3.0)
Eosinophils Absolute: 0.3 10*3/uL (ref 0.0–0.7)
Eosinophils Relative: 4.3 % (ref 0.0–5.0)
HCT: 39.9 % (ref 39.0–52.0)
Hemoglobin: 13.5 g/dL (ref 13.0–17.0)
Lymphocytes Relative: 28 % (ref 12.0–46.0)
Lymphs Abs: 2.2 10*3/uL (ref 0.7–4.0)
MCHC: 33.8 g/dL (ref 30.0–36.0)
MCV: 93.6 fl (ref 78.0–100.0)
Monocytes Absolute: 0.7 10*3/uL (ref 0.1–1.0)
Monocytes Relative: 8.8 % (ref 3.0–12.0)
Neutro Abs: 4.6 10*3/uL (ref 1.4–7.7)
Neutrophils Relative %: 58.4 % (ref 43.0–77.0)
Platelets: 223 10*3/uL (ref 150.0–400.0)
RBC: 4.27 Mil/uL (ref 4.22–5.81)
RDW: 13.9 % (ref 11.5–15.5)
WBC: 7.9 10*3/uL (ref 4.0–10.5)

## 2022-05-03 LAB — COMPREHENSIVE METABOLIC PANEL
ALT: 20 U/L (ref 0–53)
AST: 48 U/L — ABNORMAL HIGH (ref 0–37)
Albumin: 3.7 g/dL (ref 3.5–5.2)
Alkaline Phosphatase: 83 U/L (ref 39–117)
BUN: 14 mg/dL (ref 6–23)
CO2: 32 mEq/L (ref 19–32)
Calcium: 8.6 mg/dL (ref 8.4–10.5)
Chloride: 101 mEq/L (ref 96–112)
Creatinine, Ser: 1.06 mg/dL (ref 0.40–1.50)
GFR: 67.73 mL/min (ref >60.00)
Glucose, Bld: 110 mg/dL — ABNORMAL HIGH (ref 70–99)
Potassium: 3.5 mEq/L (ref 3.5–5.1)
Sodium: 138 mEq/L (ref 135–145)
Total Bilirubin: 0.4 mg/dL (ref 0.2–1.2)
Total Protein: 6.5 g/dL (ref 6.0–8.3)

## 2022-05-03 LAB — HEMOGLOBIN A1C: Hgb A1c MFr Bld: 6.4 % (ref 4.6–6.5)

## 2022-05-03 LAB — SEDIMENTATION RATE: Sed Rate: 16 mm/hr (ref 0–20)

## 2022-05-03 LAB — C-REACTIVE PROTEIN: CRP: <1 mg/dL (ref 0.5–20.0)

## 2022-05-03 MED ORDER — OXYCODONE HCL 30 MG PO TABS: 30.0000 mg | ORAL_TABLET | Freq: Three times a day (TID) | ORAL | 0 refills | Status: DC

## 2022-05-03 MED ORDER — SUMATRIPTAN SUCCINATE 50 MG PO TABS: 50.0000 mg | ORAL_TABLET | Freq: Every day | ORAL | 0 refills | Status: DC

## 2022-05-03 MED ORDER — GABAPENTIN 100 MG PO CAPS: 100.0000 mg | ORAL_CAPSULE | Freq: Three times a day (TID) | ORAL | 3 refills | Status: DC

## 2022-05-03 MED ORDER — PEPCID COMPLETE 10-800-165 MG PO CHEW: 1.0000 | CHEWABLE_TABLET | Freq: Every day | ORAL | 11 refills | Status: DC | PRN

## 2022-05-03 NOTE — Progress Notes (Signed)
[facility]: [id number]   Routine Medical Office Visit  Patient:  Michael Bender      Age: 77 y.o.       Sex:  male  Date:   05/03/2022  PCP:    Loralee Pacas, MD   [place] Provider: Loralee Pacas, MD   Assessment and Plan:   Mr.Mcauliffe reports no complaints, still awaiting to see pain management, and needs another fill of his chronic high dose oxycodone that I've taken over while waiting for him to get in with pain management.   As I was trying to go over his complex medical history he told me he is having a lot of neck pain and his leg numbing tingling pain is real bad and that the hardware in the remote neck and lumbar fusions feels displaced.  Additionally I discovered he had unilateral temporal headache(s) for 4-5 years now sometimes associated with blurry vision suspicious for carotid artery stenosis... But temporal arteritis (giant cell arteritis) will need to be ruled out - -but I don't think worth empiric steroids for early treatment(s) with such a chronic problem that wasn't bothering him any more than usual.  Given the age and headache(s), and concern(s) of hardware movement with uncontrolled myelopathic pain, will get MRI brain, cspine, and lspine.  This will help for upcoming pain management- getting XR too for that.  Also enlisting neurology support for the headache and trialing Imitrex.  Also noted temporal wasting and low BMI today that I need to investigate.  Return to office 1 month for continued close follow up complex care.  Chronic low back pain with bilateral sciatica, unspecified back pain laterality -     Gabapentin; Take 1 capsule (100 mg total) by mouth 3 (three) times daily.  Dispense: 90 capsule; Refill: 3 -     DG Lumbar Spine 2-3 Views; Future -     MR LUMBAR SPINE W WO CONTRAST; Future  Need for vaccination for Strep pneumoniae -     Sedimentation rate -     C-reactive protein  Other emphysema Assessment &  Plan: Offered referred care, deferred for now pulmonology, he will try using inhalers he has.   Gastroesophageal reflux disease without esophagitis Assessment & Plan: Advised patient to keep Pepcid complete.  Orders: -     Pepcid Complete; Chew 1 tablet by mouth daily as needed.  Dispense: 100 tablet; Refill: 11  CONSTIPATION, CHRONIC  DISC DISEASE, LUMBAR -     MR LUMBAR SPINE W WO CONTRAST; Future  History of fusion of cervical spine -     oxyCODONE HCl; Take 1 tablet (30 mg total) by mouth 3 (three) times daily.  Dispense: 90 tablet; Refill: 0 -     Gabapentin; Take 1 capsule (100 mg total) by mouth 3 (three) times daily.  Dispense: 90 capsule; Refill: 3 -     DG Cervical Spine 2 or 3 views; Future -     MR CERVICAL SPINE W WO CONTRAST; Future  Intractable pain -     oxyCODONE HCl; Take 1 tablet (30 mg total) by mouth 3 (three) times daily.  Dispense: 90 tablet; Refill: 0 -     Gabapentin; Take 1 capsule (100 mg total) by mouth 3 (three) times daily.  Dispense: 90 capsule; Refill: 3 -     MR BRAIN W WO CONTRAST; Future -     MR CERVICAL SPINE W WO CONTRAST; Future -     MR LUMBAR SPINE W  WO CONTRAST; Future  Bilateral leg pain  Intractable chronic migraine with aura with status migrainosus -     Ambulatory referral to Neurology -     SUMAtriptan Succinate; Take 1 tablet (50 mg total) by mouth daily. May repeat in 2 hours if headache persists or recurs.  Dispense: 10 tablet; Refill: 0 -     Drug Screen, 5 Panel, Ur -     US Carotid Bilateral; Future  High risk medication use -     Drug Screen, 5 Panel, Ur  Underweight on examination -     CBC with Differential/Platelet -     Comprehensive metabolic panel -     Lipid panel -     Hemoglobin A1c -     Microalbumin / creatinine urine ratio -     TSH  Screening examination for infectious disease -     Hepatitis C antibody, reflex -     HIV Antibody (routine testing w rflx)       Clinical Presentation:   77 y.o.  male here today for 1 month follow-up  Reviewed:  has a past medical history of Arthritis, Blood transfusion, Chronic back pain, COPD (chronic obstructive pulmonary disease), Disturbance of skin sensation (05/22/2018), Essential hypertension (03/05/2007), Gastric erosion, Gastritis and gastroduodenitis, Gastrointestinal hemorrhage with melena (11/20/2018), GIB (gastrointestinal bleeding) (07/18/2019), Headache(784.0), Hemorrhoids, internal, History of lumbar fusion (03/03/2022), History of upper gastrointestinal bleeding (03/03/2022), HLD (hyperlipidemia), Hypertension, Loss of weight, Neck rigidity, Nocturia, PAIN, CHRONIC NEC (10/06/2006), Pneumonia, Prostate disease, and S/P cervical spinal fusion (03/03/2022). Active Ambulatory Problems    Diagnosis Date Noted   Pulmonary emphysema 03/26/2007   GERD 04/04/2007   CONSTIPATION, CHRONIC 10/07/2008   [place] DISEASE, LUMBAR 01/01/2009   Bilateral leg pain 05/03/2010   Esophageal dysphagia 07/15/2011   Right sided temporal headache 05/22/2018   Insomnia 11/20/2018   PAD (peripheral artery disease) 12/11/2018   Senile ecchymosis 01/21/2019   History of CVA (cerebrovascular accident) 02/18/2019   HLD (hyperlipidemia) 07/18/2019   COPD (chronic obstructive pulmonary disease) 07/18/2019   Iron deficiency anemia    History of duodenal ulcer    Duodenal stenosis    Chronic narcotic dependence 12/14/2020   Atherosclerosis of aorta 12/14/2020   History of fusion of cervical spine 03/03/2022   History of lumbar fusion 03/03/2022   High risk medication use 03/03/2022   Intractable pain 03/03/2022   History of upper gastrointestinal bleeding 03/03/2022   Gynecomastia, male 04/05/2022   Resolved Ambulatory Problems    Diagnosis Date Noted   PAIN, CHRONIC NEC 10/06/2006   OTITIS EXTERNA 01/04/2007   Extrinsic asthma, unspecified 02/23/2009   Cellulitis and abscess of oral soft tissues 04/20/2007   DYSPNEA 03/26/2007   Essential hypertension  03/05/2007   Disturbance of skin sensation 05/22/2018   Gastrointestinal hemorrhage with melena 11/20/2018   Symptomatic anemia 07/17/2019   Hypertensive urgency 07/18/2019   AKI (acute kidney injury) 07/18/2019   GIB (gastrointestinal bleeding) 07/18/2019   Loss of weight    Gastritis and gastroduodenitis    Gastric erosion    Past Medical History:  Diagnosis Date   Arthritis    Blood transfusion    Chronic back pain    Headache(784.0)    Hemorrhoids, internal    Hypertension    Neck rigidity    Nocturia    Pneumonia    Prostate disease    S/P cervical spinal fusion 03/03/2022    Outpatient Medications Prior to Visit  Medication Sig   acetaminophen (TYLENOL) 325 MG  tablet Take 2 tablets (650 mg total) by mouth every 6 (six) hours as needed for mild pain (or Fever >/= 101).   albuterol (VENTOLIN HFA) 108 (90 Base) MCG/ACT inhaler Inhale 2 puffs into the lungs every 6 (six) hours as needed for wheezing or shortness of breath.   amitriptyline (ELAVIL) 50 MG tablet Take 50 mg by mouth in the morning, at noon, and at bedtime.    amLODipine (NORVASC) 5 MG tablet TAKE 1 TABLET BY MOUTH EVERYDAY AT BEDTIME   cyclobenzaprine (FLEXERIL) 10 MG tablet Take 10 mg by mouth 3 (three) times daily.   mupirocin ointment (BACTROBAN) 2 %    pantoprazole (PROTONIX) 40 MG tablet Take 1 tablet (40 mg total) by mouth daily before breakfast.   polyethylene glycol (MIRALAX / GLYCOLAX) 17 g packet Take 34 g by mouth daily.    ramelteon (ROZEREM) 8 MG tablet Take 1 tablet (8 mg total) by mouth at bedtime.   rosuvastatin (CRESTOR) 40 MG tablet Take 1 tablet (40 mg total) by mouth daily. Replaces atorvastatin (stop atorvastatin if still taking)   SYMBICORT 160-4.5 MCG/ACT inhaler INHALE 2 PUFFS INTO THE LUNGS TWICE A DAY   [DISCONTINUED] oxycodone (ROXICODONE) 30 MG immediate release tablet Take 1 tablet (30 mg total) by mouth 3 (three) times daily.   [DISCONTINUED] sucralfate (CARAFATE) 1 GM/10ML  suspension Take 10 mLs (1 g total) by mouth 4 (four) times daily -  with meals and at bedtime.   No facility-administered medications prior to visit.    HPI  Updated and modified:  Problem  Bilateral Leg Pain   Numbing. "When I'm walking I'm fine, when I stop it hurts"   DISC DISEASE, LUMBAR   History fusiion  lumbar Dr. Mellody Dance, CHRONIC   Taking miralax Patient reports every 3rd day with that.   GERD   With history bleeding duodenal ulcer Patient reports that they are not experiencing any gastroesophageal reflux disease, not on any medications      Pulmonary Emphysema   Per CT angiogram 07/2019: There is upper lobe predominant emphysema  He has heavy smoking history  Never seen by lung specialist. Has inhalers he never opened.   Gastrointestinal Hemorrhage With Melena (Resolved)  Disturbance of Skin Sensation (Resolved)  Extrinsic asthma, unspecified (Resolved)     Replacing diagnoses that were inactivated after the 05/02/22 regulatory import              Clinical Data Analysis:   Physical Exam  BP 128/74 (BP Location: Left Arm, Patient Position: Sitting)   Pulse 98   Temp 97.7 F (36.5 C) (Temporal)   Ht 5\' 11"  (1.803 m)   Wt 142 lb 12.8 oz (64.8 kg)   SpO2 96%   BMI 19.92 kg/m  Wt Readings from Last 10 Encounters:  05/03/22 142 lb 12.8 oz (64.8 kg)  04/05/22 146 lb (66.2 kg)  03/03/22 143 lb 9.6 oz (65.1 kg)  07/14/21 153 lb (69.4 kg)  12/14/20 147 lb 12.8 oz (67 kg)  11/13/19 154 lb (69.9 kg)  09/16/19 146 lb (66.2 kg)  08/06/19 146 lb (66.2 kg)  07/19/19 144 lb 13.5 oz (65.7 kg)  07/17/19 148 lb (67.1 kg)   Vital signs reviewed.  Nursing notes reviewed. Weight trend reviewed. Abnormalities and Problem-Specific physical exam findings:  temporal wasting General Appearance:  No acute distress appreciable.   Well-groomed, healthy-appearing male.  Well proportioned with no abnormal fat distribution.  Good muscle tone. Skin: Clear and  well-hydrated. Pulmonary:  Normal work of breathing at rest, no respiratory distress apparent. SpO2: 96 %  Musculoskeletal: Patient demonstrates smooth and coordinated movements throughout all major joints.All extremities are intact.  Neurological:  Awake, alert, oriented, and engaged.  No obvious focal neurological deficits or cognitive impairments.  Sensorium seems unclouded. Gait is smooth and coordinated.  Speech is clear and coherent with logical content. Psychiatric:  Appropriate mood, pleasant and cooperative demeanor, cheerful and engaged during the exam    Additional Results Reviewed:     Results for orders placed or performed in visit on 05/03/22  CBC with Differential/Platelet  Result Value Ref Range   WBC 7.9 4.0 - 10.5 K/uL   RBC 4.27 4.22 - 5.81 Mil/uL   Hemoglobin 13.5 13.0 - 17.0 g/dL   HCT 39.9 39.0 - 52.0 %   MCV 93.6 78.0 - 100.0 fl   MCHC 33.8 30.0 - 36.0 g/dL   RDW 13.9 11.5 - 15.5 %   Platelets 223.0 150.0 - 400.0 K/uL   Neutrophils Relative % 58.4 43.0 - 77.0 %   Lymphocytes Relative 28.0 12.0 - 46.0 %   Monocytes Relative 8.8 3.0 - 12.0 %   Eosinophils Relative 4.3 0.0 - 5.0 %   Basophils Relative 0.5 0.0 - 3.0 %   Neutro Abs 4.6 1.4 - 7.7 K/uL   Lymphs Abs 2.2 0.7 - 4.0 K/uL   Monocytes Absolute 0.7 0.1 - 1.0 K/uL   Eosinophils Absolute 0.3 0.0 - 0.7 K/uL   Basophils Absolute 0.0 0.0 - 0.1 K/uL  Comprehensive metabolic panel  Result Value Ref Range   Sodium 138 135 - 145 mEq/L   Potassium 3.5 3.5 - 5.1 mEq/L   Chloride 101 96 - 112 mEq/L   CO2 32 19 - 32 mEq/L   Glucose, Bld 110 (H) 70 - 99 mg/dL   BUN 14 6 - 23 mg/dL   Creatinine, Ser 1.06 0.40 - 1.50 mg/dL   Total Bilirubin 0.4 0.2 - 1.2 mg/dL   Alkaline Phosphatase 83 39 - 117 U/L   AST 48 (H) 0 - 37 U/L   ALT 20 0 - 53 U/L   Total Protein 6.5 6.0 - 8.3 g/dL   Albumin 3.7 3.5 - 5.2 g/dL   GFR 67.73 >60.00 mL/min   Calcium 8.6 8.4 - 10.5 mg/dL  Lipid panel  Result Value Ref Range    Cholesterol 93 0 - 200 mg/dL   Triglycerides 119.0 0.0 - 149.0 mg/dL   HDL 56.30 >39.00 mg/dL   VLDL 23.8 0.0 - 40.0 mg/dL   LDL Cholesterol 13 0 - 99 mg/dL   Total CHOL/HDL Ratio 2    NonHDL 36.54   Hemoglobin A1c  Result Value Ref Range   Hgb A1c MFr Bld 6.4 4.6 - 6.5 %  Microalbumin / creatinine urine ratio  Result Value Ref Range   Microalb, Ur 5.4 (H) 0.0 - 1.9 mg/dL   Creatinine,U 33.0 mg/dL   Microalb Creat Ratio 16.2 0.0 - 30.0 mg/g  TSH  Result Value Ref Range   TSH 1.88 0.35 - 5.50 uIU/mL  Sedimentation rate  Result Value Ref Range   Sed Rate 16 0 - 20 mm/hr  C-reactive protein  Result Value Ref Range   CRP <1.0 0.5 - 20.0 mg/dL    Recent Results (from the past 2160 hour(s))  CBC with Differential/Platelet     Status: None   Collection Time: 05/03/22 11:09 AM  Result Value Ref Range   WBC 7.9 4.0 - 10.5 K/uL  RBC 4.27 4.22 - 5.81 Mil/uL   Hemoglobin 13.5 13.0 - 17.0 g/dL   HCT 39.9 39.0 - 52.0 %   MCV 93.6 78.0 - 100.0 fl   MCHC 33.8 30.0 - 36.0 g/dL   RDW 13.9 11.5 - 15.5 %   Platelets 223.0 150.0 - 400.0 K/uL   Neutrophils Relative % 58.4 43.0 - 77.0 %   Lymphocytes Relative 28.0 12.0 - 46.0 %   Monocytes Relative 8.8 3.0 - 12.0 %   Eosinophils Relative 4.3 0.0 - 5.0 %   Basophils Relative 0.5 0.0 - 3.0 %   Neutro Abs 4.6 1.4 - 7.7 K/uL   Lymphs Abs 2.2 0.7 - 4.0 K/uL   Monocytes Absolute 0.7 0.1 - 1.0 K/uL   Eosinophils Absolute 0.3 0.0 - 0.7 K/uL   Basophils Absolute 0.0 0.0 - 0.1 K/uL  Comprehensive metabolic panel     Status: Abnormal   Collection Time: 05/03/22 11:09 AM  Result Value Ref Range   Sodium 138 135 - 145 mEq/L   Potassium 3.5 3.5 - 5.1 mEq/L   Chloride 101 96 - 112 mEq/L   CO2 32 19 - 32 mEq/L   Glucose, Bld 110 (H) 70 - 99 mg/dL   BUN 14 6 - 23 mg/dL   Creatinine, Ser 1.06 0.40 - 1.50 mg/dL   Total Bilirubin 0.4 0.2 - 1.2 mg/dL   Alkaline Phosphatase 83 39 - 117 U/L   AST 48 (H) 0 - 37 U/L   ALT 20 0 - 53 U/L   Total Protein  6.5 6.0 - 8.3 g/dL   Albumin 3.7 3.5 - 5.2 g/dL   GFR 67.73 >60.00 mL/min    Comment: Calculated using the CKD-EPI Creatinine Equation (2021)   Calcium 8.6 8.4 - 10.5 mg/dL  Lipid panel     Status: None   Collection Time: 05/03/22 11:09 AM  Result Value Ref Range   Cholesterol 93 0 - 200 mg/dL    Comment: ATP III Classification       Desirable:  < 200 mg/dL               Borderline High:  200 - 239 mg/dL          High:  > = 240 mg/dL   Triglycerides 119.0 0.0 - 149.0 mg/dL    Comment: Normal:  <150 mg/dLBorderline High:  150 - 199 mg/dL   HDL 56.30 >39.00 mg/dL   VLDL 23.8 0.0 - 40.0 mg/dL   LDL Cholesterol 13 0 - 99 mg/dL   Total CHOL/HDL Ratio 2     Comment:                Men          Women1/2 Average Risk     3.4          3.3Average Risk          5.0          4.42X Average Risk          9.6          7.13X Average Risk          15.0          11.0                       NonHDL 36.54     Comment: NOTE:  Non-HDL goal should be 30 mg/dL higher than patient's LDL goal (i.e. LDL goal of < 70 mg/dL,  would have non-HDL goal of < 100 mg/dL)  Hemoglobin A1c     Status: None   Collection Time: 05/03/22 11:09 AM  Result Value Ref Range   Hgb A1c MFr Bld 6.4 4.6 - 6.5 %    Comment: Glycemic Control Guidelines for People with Diabetes:Non Diabetic:  <6%Goal of Therapy: <7%Additional Action Suggested:  >8%   Microalbumin / creatinine urine ratio     Status: Abnormal   Collection Time: 05/03/22 11:09 AM  Result Value Ref Range   Microalb, Ur 5.4 (H) 0.0 - 1.9 mg/dL   Creatinine,U 33.0 mg/dL   Microalb Creat Ratio 16.2 0.0 - 30.0 mg/g  TSH     Status: None   Collection Time: 05/03/22 11:09 AM  Result Value Ref Range   TSH 1.88 0.35 - 5.50 uIU/mL  Sedimentation rate     Status: None   Collection Time: 05/03/22 11:09 AM  Result Value Ref Range   Sed Rate 16 0 - 20 mm/hr  C-reactive protein     Status: None   Collection Time: 05/03/22 11:09 AM  Result Value Ref Range   CRP <1.0 0.5 - 20.0  mg/dL    No image results found.   No results found.   --------------------------------    Signed: Loralee Pacas, MD 05/03/2022 6:07 PM

## 2022-05-03 NOTE — Patient Instructions (Signed)
It was a pleasure seeing you today!  Your health and satisfaction are my top priorities. If you believe your experience today was worthy of a 5-star rating, I'd be grateful for your feedback! Loralee Pacas, MD   CHECKOUT CHECKLIST  []    Schedule next appointment(s):    Scheduled for 06/07/2022  Any requested lab visits should be scheduled as appointments too  If you are not doing well:  Return to the office sooner Please bring all your medicine bottles to each appointment If your condition begins to worsen or become severe:  go to the emergency room or even call 911  []    Sign release of information authorizations: Any records we need for your care and to be your medical home  []    X-rays can be obtained at the  [facility] office. You can walk in M-F between 8:30am- noon or 1pm - 5pm. Tell them you are there for xrays ordered by me. They will send me the results, then I will let you know the results with instructions. Address: [address].  The Xray department is located in the basement.     []   (Optional):  Review your clinical notes on MyChart after they are completed.     Today's draft of the physician documented plan for today's visit: (final revisions will be visible on MyChart chart later) Chronic low back pain with bilateral sciatica, unspecified back pain laterality -     Gabapentin; Take 1 capsule (100 mg total) by mouth 3 (three) times daily.  Dispense: 90 capsule; Refill: 3 -     DG Lumbar Spine 2-3 Views; Future -     MR LUMBAR SPINE W WO CONTRAST; Future  Need for vaccination for Strep pneumoniae -     Sedimentation rate -     C-reactive protein  Other emphysema Assessment & Plan: Offered referred care, deferred for now pulmonology, he will try using inhalers he has.   Gastroesophageal reflux disease without esophagitis Assessment & Plan: Advised patient to keep Pepcid complete.  Orders: -     Pepcid Complete; Chew 1 tablet by mouth daily as  needed.  Dispense: 100 tablet; Refill: 11  CONSTIPATION, CHRONIC  DISC DISEASE, LUMBAR -     MR LUMBAR SPINE W WO CONTRAST; Future  History of fusion of cervical spine -     oxyCODONE HCl; Take 1 tablet (30 mg total) by mouth 3 (three) times daily.  Dispense: 90 tablet; Refill: 0 -     Gabapentin; Take 1 capsule (100 mg total) by mouth 3 (three) times daily.  Dispense: 90 capsule; Refill: 3 -     DG Cervical Spine 2 or 3 views; Future -     MR CERVICAL SPINE W WO CONTRAST; Future  Intractable pain -     oxyCODONE HCl; Take 1 tablet (30 mg total) by mouth 3 (three) times daily.  Dispense: 90 tablet; Refill: 0 -     Gabapentin; Take 1 capsule (100 mg total) by mouth 3 (three) times daily.  Dispense: 90 capsule; Refill: 3 -     MR BRAIN W WO CONTRAST; Future -     MR CERVICAL SPINE W WO CONTRAST; Future -     MR LUMBAR SPINE W WO CONTRAST; Future  Bilateral leg pain  Intractable chronic migraine with aura with status migrainosus -     Ambulatory referral to Neurology -     SUMAtriptan Succinate; Take 1 tablet (50 mg total) by  mouth daily. May repeat in 2 hours if headache persists or recurs.  Dispense: 10 tablet; Refill: 0 -     Drug Screen, 5 Panel, Ur -     US Carotid Bilateral; Future  High risk medication use -     Drug Screen, 5 Panel, Ur  Underweight on examination -     CBC with Differential/Platelet -     Comprehensive metabolic panel -     Lipid panel -     Hemoglobin A1c -     Microalbumin / creatinine urine ratio -     TSH  Screening examination for infectious disease -     Hepatitis C antibody, reflex -     HIV Antibody (routine testing w rflx)  Cachexia  Duodenal stenosis      QUESTIONS & CONCERNS: CLINICAL: please contact us via phone [phone] OR MyChart messaging  LAB & IMAGING:   We will call you if the results are significantly abnormal or you don't use MyChart.  Most normal results will be posted to MyChart immediately and have a clinical  review message by Dr. Randol Kern posted within 2-3 business days.   If you have not heard from [place] regarding the results in 2 weeks OR if you need priority reporting, please contact this office. MYCHART:  The fastest way to get your results and easiest way to stay in touch with [place] is by activating your My Chart account. Instructions are located on the last page of this paperwork.  BILLING: xray and lab orders are billed from separate companies and questions./concerns should be directed to the [facility].  For visit charges please discuss with our administrative services COMPLAINTS:  please let Dr. Randol Kern know or see the [facility], by asking at the front desk: we want you to be satisfied with every experience and we would be grateful for the opportunity to address any problems

## 2022-05-03 NOTE — Assessment & Plan Note (Signed)
Advised patient to keep Pepcid complete.

## 2022-05-03 NOTE — Assessment & Plan Note (Signed)
>>  ASSESSMENT AND PLAN FOR PULMONARY EMPHYSEMA (HCC) WRITTEN ON 05/03/2022 10:30 AM BY Herberth Deharo G, MD  Offered referred care, deferred for now pulmonology, he will try using inhalers he has.

## 2022-05-03 NOTE — Assessment & Plan Note (Signed)
Offered referred care, deferred for now pulmonology, he will try using inhalers he has.

## 2022-05-04 LAB — HIV ANTIBODY (ROUTINE TESTING W REFLEX): HIV 1&2 Ab, 4th Generation: NONREACTIVE

## 2022-05-05 LAB — DRUG SCREEN, 5 PANEL, UR
Amphetamines, Urine: NEGATIVE ng/mL
Cannabinoid Quant, Ur: NEGATIVE ng/mL
Cocaine (Metab.): NEGATIVE ng/mL
OPIATE QUANTITATIVE URINE: NEGATIVE ng/mL
PCP Quant, Ur: NEGATIVE ng/mL

## 2022-05-10 DIAGNOSIS — G43009 Migraine without aura, not intractable, without status migrainosus: Secondary | ICD-10-CM | POA: Diagnosis not present

## 2022-05-10 DIAGNOSIS — G894 Chronic pain syndrome: Secondary | ICD-10-CM | POA: Diagnosis not present

## 2022-05-10 DIAGNOSIS — M961 Postlaminectomy syndrome, not elsewhere classified: Secondary | ICD-10-CM | POA: Diagnosis not present

## 2022-05-12 DIAGNOSIS — Z79891 Long term (current) use of opiate analgesic: Secondary | ICD-10-CM | POA: Diagnosis not present

## 2022-05-12 DIAGNOSIS — G894 Chronic pain syndrome: Secondary | ICD-10-CM | POA: Diagnosis not present

## 2022-05-20 DIAGNOSIS — R69 Illness, unspecified: Secondary | ICD-10-CM | POA: Diagnosis not present

## 2022-05-20 DIAGNOSIS — I10 Essential (primary) hypertension: Secondary | ICD-10-CM | POA: Diagnosis not present

## 2022-05-20 DIAGNOSIS — Z809 Family history of malignant neoplasm, unspecified: Secondary | ICD-10-CM | POA: Diagnosis not present

## 2022-05-20 DIAGNOSIS — M1990 Unspecified osteoarthritis, unspecified site: Secondary | ICD-10-CM | POA: Diagnosis not present

## 2022-05-20 DIAGNOSIS — F17210 Nicotine dependence, cigarettes, uncomplicated: Secondary | ICD-10-CM | POA: Diagnosis not present

## 2022-05-20 DIAGNOSIS — E785 Hyperlipidemia, unspecified: Secondary | ICD-10-CM | POA: Diagnosis not present

## 2022-05-20 DIAGNOSIS — N529 Male erectile dysfunction, unspecified: Secondary | ICD-10-CM | POA: Diagnosis not present

## 2022-05-20 DIAGNOSIS — K219 Gastro-esophageal reflux disease without esophagitis: Secondary | ICD-10-CM | POA: Diagnosis not present

## 2022-05-20 DIAGNOSIS — J439 Emphysema, unspecified: Secondary | ICD-10-CM | POA: Diagnosis not present

## 2022-05-20 DIAGNOSIS — Z823 Family history of stroke: Secondary | ICD-10-CM | POA: Diagnosis not present

## 2022-06-03 ENCOUNTER — Other Ambulatory Visit: Payer: Self-pay | Admitting: Internal Medicine

## 2022-06-03 ENCOUNTER — Telehealth: Payer: Self-pay | Admitting: Internal Medicine

## 2022-06-03 ENCOUNTER — Ambulatory Visit (INDEPENDENT_AMBULATORY_CARE_PROVIDER_SITE_OTHER): Payer: Medicare HMO | Admitting: Internal Medicine

## 2022-06-03 VITALS — BP 130/80 | HR 110 | Temp 97.8°F | Ht 71.0 in | Wt 141.8 lb

## 2022-06-03 DIAGNOSIS — Z23 Encounter for immunization: Secondary | ICD-10-CM | POA: Diagnosis not present

## 2022-06-03 DIAGNOSIS — G43E11 Chronic migraine with aura, intractable, with status migrainosus: Secondary | ICD-10-CM

## 2022-06-03 DIAGNOSIS — S6990XA Unspecified injury of unspecified wrist, hand and finger(s), initial encounter: Secondary | ICD-10-CM | POA: Diagnosis not present

## 2022-06-03 DIAGNOSIS — T148XXA Other injury of unspecified body region, initial encounter: Secondary | ICD-10-CM | POA: Diagnosis not present

## 2022-06-03 MED ORDER — AMOXICILLIN-POT CLAVULANATE 875-125 MG PO TABS
1.0000 | ORAL_TABLET | Freq: Two times a day (BID) | ORAL | 0 refills | Status: AC
Start: 2022-06-03 — End: ?

## 2022-06-03 NOTE — Telephone Encounter (Signed)
Final outcome: Clinical Call--states they were unable to triage actual patient. Pt's son was spoken to instead. Pt has been scheduled for 06/03/22 @ 1:20pm.   Patient Name: Michael Bender [facility] Gender: Male DOB: [birth year] Age: 77 Y 6 M 9 D Return Phone Number: [phone] (Primary) Address: City/ State/ Zip: [place]  [address] Client [facility], [facility] Provider [name]- MD Contact Type Call Who Is Calling Patient / Member / Family / Caregiver Call Type Triage / Clinical Caller Name [name] Relationship To Patient Son Return Phone Number [phone] (Primary) Chief Complaint Wound Infection Reason for Call Symptomatic / Request for Health Information Initial Comment Caller states his father fell on Wednesday and he has a wound that is infected and swollen. Caller states he did not hit his head but the wound in on his wrist. Caller states he also needs to cancel his appointment he has coming up. Translation No Nurse Assessment Nurse: Shanna Cisco, RN, Gavin Pound Date/Time Lamount Cohen Time): 06/03/2022 11:06:41 AM Confirm and document reason for call. If symptomatic, describe symptoms. ---Caller states his father fell on Wednesday and he has a wound that is infected and swollen. Caller states he did not hit his head, the wound in on his hand. Caller states he also needs to cancel his appointment he has coming up. Does the patient have any new or worsening symptoms? ---Yes Will a triage be completed? ---Yes Related visit to physician within the last 2 weeks? ---No Does the PT have any chronic conditions? (i.e. diabetes, asthma, this includes High risk factors for pregnancy, etc.) ---No Is this a behavioral health or substance abuse call? ---No Disp. Time Lamount Cohen Time) Disposition Final User 06/03/2022 11:16:12 AM Clinical Call Yes Shanna Cisco, RN, Deborah Final  Disposition 06/03/2022 11:16:12 AM Clinical Call Yes Shanna Cisco, RN, Lynnea Maizes NOTE: All timestamps contained within this report are represented as [place] Standard Time. CONFIDENTIALTY NOTICE: This fax transmission is intended only for the addressee. It contains information that is legally privileged, confidential or otherwise protected from use or disclosure. If you are not the intended recipient, you are strictly prohibited from reviewing, disclosing, copying using or disseminating any of this information or taking any action in reliance on or regarding this information. If you have received this fax in error, please notify us immediately by telephone so that we can arrange for its return to [place]. Phone: [phone], Toll-Free: [phone], Fax: [phone] Page: 2 of 2 Call Id: [id number] Comments User: Karilyn Cota, RN Date/Time Lamount Cohen Time): 06/03/2022 11:15:36 AM Caller refused to get pt on the phone for triage, caller not with patient. Unable to triage symptoms. Spoke with the office and explained pt's symptoms office staff recommended pt to be seen at the ER. Caller was informed about office staff recommendation and call was warm transferred to the office for appt coming up cancellation.

## 2022-06-03 NOTE — Telephone Encounter (Signed)
FYI: This call has been transferred to Access Nurse. Once the result note has been entered staff can address the message at that time.  Patient called in with the following symptoms:  Red Word:fall  and swelling of wrist, possible puncture wound   Please advise at Mobile [phone] (mobile)  Message is routed to Provider Pool and [facility] Triage

## 2022-06-05 ENCOUNTER — Encounter: Payer: Self-pay | Admitting: Internal Medicine

## 2022-06-05 NOTE — Patient Instructions (Addendum)
It was a pleasure seeing you today!  Your health and satisfaction are my top priorities. If you believe your experience today was worthy of a 5-star rating, I would be grateful for your feedback! Lula Olszewski, MD   [x]    I always recommend close follow up (within 1-2 weeks) with your Primary Care Provider (PCP) for any acute problems.  If you are not doing well:  Return to the office sooner.  If your condition begins worsening or become severe or you can't get a sooner appointment :  go to the emergency room.  [x]    Please carefully review your clinical instructions and notes on MyChart (they will be finished later)   Today's draft of the physician documented plan for today's visit: (final revisions will be visible on MyChart chart later)  Puncture wound  Need for Tdap vaccination -     Tdap vaccine greater than or equal to 7yo IM  Need for shingles vaccine -     Varicella-zoster vaccine IM  Fish hook in hand -     Amoxicillin-Pot Clavulanate; Take 1 tablet by mouth 2 (two) times daily.  Dispense: 20 tablet; Refill: 0    QUESTIONS & CONCERNS: CLINICAL: please contact us via phone [phone] OR MyChart messaging  LAB & IMAGING:   We will call you if the results are significantly abnormal or you don't use MyChart.  Most normal results will be posted to MyChart immediately and have a clinical review message by Dr. Jon Billings posted within 2-3 business days.   If you have not heard from [place] regarding the results in 2 weeks OR if you need priority reporting, please contact this office. MYCHART:  The fastest way to get your results and easiest way to stay in touch with [place] is by activating your My Chart account. Instructions are located on the last page of this paperwork.  BILLING: xray and lab orders are billed from separate companies and questions./concerns should be directed to the invoicing company.  For visit charges please discuss with our administrative services. COMPLAINTS:  please  let Dr. Jon Billings know or see the [facility] Administrator - Edwena Felty or [facility], by asking at the front desk: we want you to be satisfied with every experience and we would be grateful for the opportunity to address any problems.

## 2022-06-05 NOTE — Progress Notes (Signed)
[facility]: [phone]   Routine Medical Office Visit  Patient:  Michael Bender      Age: 77 y.o.       Sex:  male  Date:   06/03/2022 PCP:    Lula Olszewski, MD   Today's Healthcare Provider: Lula Olszewski, MD       Assessment and Plan:   Puncture wound  Need for Tdap vaccination -     Tdap vaccine greater than or equal to 7yo IM  Need for shingles vaccine -     Varicella-zoster vaccine IM  Fish hook in hand -     Amoxicillin-Pot Clavulanate; Take 1 tablet by mouth 2 (two) times daily.  Dispense: 20 tablet; Refill: 0     Treatment plan discussed and reviewed in detail. Explained medication safety and potential side effects. Agreed on patient returning to office if symptoms worsen, persist, or new symptoms develop. Discussed precautions in case of needing to visit the Emergency Department. Answered all patient questions and confirmed understanding and comfort with the plan. Encouraged patient to contact our office if they have any questions or concerns.         Clinical Presentation:   77 y.o. male here today for fish hook puncture wound  HPI  Patient reports tripping and catching himself with hand against wall in a way that led to fish hook puncturing his hand He pulled the fish hook out and cleaned it It doesn't hurt much if at all- maybe a little when he pushes it, but here due to fish hook had rusty metal.  Updated chart data:  No problems updated.   Reviewed chart data: Active Ambulatory Problems    Diagnosis Date Noted   Pulmonary emphysema (HCC) 03/26/2007   GERD 04/04/2007   CONSTIPATION, CHRONIC 10/07/2008   DISC DISEASE, LUMBAR 01/01/2009   Bilateral leg pain 05/03/2010   Esophageal dysphagia 07/15/2011   Right sided temporal headache 05/22/2018   Insomnia 11/20/2018   PAD (peripheral artery disease) (HCC) 12/11/2018   Senile ecchymosis 01/21/2019   History of CVA (cerebrovascular accident) 02/18/2019   HLD  (hyperlipidemia) 07/18/2019   COPD (chronic obstructive pulmonary disease) (HCC) 07/18/2019   Iron deficiency anemia    Duodenal stenosis    Chronic narcotic dependence (HCC) 12/14/2020   Atherosclerosis of aorta (HCC) 12/14/2020   History of fusion of cervical spine 03/03/2022   History of lumbar fusion 03/03/2022   High risk medication use 03/03/2022   Intractable pain 03/03/2022   Gynecomastia, male 04/05/2022   Cachexia (HCC) 05/03/2022   Underweight on examination 05/03/2022   Chronic low back pain with bilateral sciatica 05/03/2022   Resolved Ambulatory Problems    Diagnosis Date Noted   PAIN, CHRONIC NEC 10/06/2006   OTITIS EXTERNA 01/04/2007   Extrinsic asthma, unspecified 02/23/2009   Cellulitis and abscess of oral soft tissues 04/20/2007   DYSPNEA 03/26/2007   Essential hypertension 03/05/2007   Disturbance of skin sensation 05/22/2018   Gastrointestinal hemorrhage with melena 11/20/2018   Symptomatic anemia 07/17/2019   Hypertensive urgency 07/18/2019   AKI (acute kidney injury) (HCC) 07/18/2019   GIB (gastrointestinal bleeding) 07/18/2019   Loss of weight    History of duodenal ulcer    Gastritis and gastroduodenitis    Gastric erosion    History of upper gastrointestinal bleeding 03/03/2022   Past Medical History:  Diagnosis Date   Arthritis    Blood transfusion    Chronic back pain    Headache(784.0)  Hemorrhoids, internal    Hypertension    Neck rigidity    Nocturia    Pneumonia    Prostate disease    S/P cervical spinal fusion 03/03/2022    Outpatient Medications Prior to Visit  Medication Sig   acetaminophen (TYLENOL) 325 MG tablet Take 2 tablets (650 mg total) by mouth every 6 (six) hours as needed for mild pain (or Fever >/= 101).   albuterol (VENTOLIN HFA) 108 (90 Base) MCG/ACT inhaler Inhale 2 puffs into the lungs every 6 (six) hours as needed for wheezing or shortness of breath.   amitriptyline (ELAVIL) 50 MG tablet Take 50 mg by mouth in  the morning, at noon, and at bedtime.    amLODipine (NORVASC) 5 MG tablet TAKE 1 TABLET BY MOUTH EVERYDAY AT BEDTIME   cyclobenzaprine (FLEXERIL) 10 MG tablet Take 10 mg by mouth 3 (three) times daily.   famotidine-calcium carbonate-magnesium hydroxide (PEPCID COMPLETE) 10-800-165 MG chewable tablet Chew 1 tablet by mouth daily as needed.   gabapentin (NEURONTIN) 100 MG capsule Take 1 capsule (100 mg total) by mouth 3 (three) times daily.   mupirocin ointment (BACTROBAN) 2 %    oxycodone (ROXICODONE) 30 MG immediate release tablet Take 1 tablet (30 mg total) by mouth 3 (three) times daily.   pantoprazole (PROTONIX) 40 MG tablet Take 1 tablet (40 mg total) by mouth daily before breakfast.   polyethylene glycol (MIRALAX / GLYCOLAX) 17 g packet Take 34 g by mouth daily.    ramelteon (ROZEREM) 8 MG tablet Take 1 tablet (8 mg total) by mouth at bedtime.   rosuvastatin (CRESTOR) 40 MG tablet Take 1 tablet (40 mg total) by mouth daily. Replaces atorvastatin (stop atorvastatin if still taking)   SYMBICORT 160-4.5 MCG/ACT inhaler INHALE 2 PUFFS INTO THE LUNGS TWICE A DAY   [DISCONTINUED] SUMAtriptan (IMITREX) 50 MG tablet Take 1 tablet (50 mg total) by mouth daily. May repeat in 2 hours if headache persists or recurs.   No facility-administered medications prior to visit.           Clinical Data Analysis:   Physical Exam  BP 130/80   Pulse (!) 110   Temp 97.8 F (36.6 C)   Ht 5\' 11"  (1.803 m)   Wt 141 lb 12.8 oz (64.3 kg)   SpO2 97%   BMI 19.78 kg/m  Wt Readings from Last 10 Encounters:  06/03/22 141 lb 12.8 oz (64.3 kg)  05/03/22 142 lb 12.8 oz (64.8 kg)  04/05/22 146 lb (66.2 kg)  03/03/22 143 lb 9.6 oz (65.1 kg)  07/14/21 153 lb (69.4 kg)  12/14/20 147 lb 12.8 oz (67 kg)  11/13/19 154 lb (69.9 kg)  09/16/19 146 lb (66.2 kg)  08/06/19 146 lb (66.2 kg)  07/19/19 144 lb 13.5 oz (65.7 kg)   Vital signs reviewed.  Nursing notes reviewed. Weight trend reviewed. Abnormalities and  Problem-Specific physical exam findings:  deep small puncture wound with no obvious 2ndary infection, some tenderness to pressure, scabbed over. He has poor hand function in terms of ability to make fist but its always this way he reports.  He has no dizziness or unsteadiness on his feet. Fish hook has been removed.no signs of infection tracking up and away from the wound.  No signs of pus collecting in the wound. General Appearance:  No acute distress appreciable.   Well-groomed, healthy-appearing male.  Well proportioned with no abnormal fat distribution.  Good muscle tone. Skin: Clear and well-hydrated. Pulmonary:  Normal work of breathing  at rest, no respiratory distress apparent. SpO2: 97 %  Musculoskeletal: All extremities are intact.  Neurological:  Awake, alert, oriented, and engaged.  No obvious focal neurological deficits or cognitive impairments.  Sensorium seems unclouded.   Speech is clear and coherent with logical content. Psychiatric:  Appropriate mood, pleasant and cooperative demeanor, cheerful and engaged during the exam   Additional Results Reviewed:     No results found for any visits on 06/03/22.  Recent Results (from the past 2160 hour(s))  Drug Screen, 5 Panel, Ur     Status: None   Collection Time: 05/03/22 11:09 AM  Result Value Ref Range   Amphetamines, Urine Negative Cutoff=1000 ng/mL    Comment: Amphetamine test includes Amphetamine and Methamphetamine.   Cannabinoid Quant, Ur Negative Cutoff=50 ng/mL   Cocaine (Metab.) Negative Cutoff=300 ng/mL   OPIATE QUANTITATIVE URINE Negative Cutoff=2000 ng/mL    Comment: Opiate test includes Codeine and Morphine only.   PCP Quant, Ur Negative Cutoff=25 ng/mL  CBC with Differential/Platelet     Status: None   Collection Time: 05/03/22 11:09 AM  Result Value Ref Range   WBC 7.9 4.0 - 10.5 K/uL   RBC 4.27 4.22 - 5.81 Mil/uL   Hemoglobin 13.5 13.0 - 17.0 g/dL   HCT 16.1 09.6 - 04.5 %   MCV 93.6 78.0 - 100.0 fl   MCHC  33.8 30.0 - 36.0 g/dL   RDW 40.9 81.1 - 91.4 %   Platelets 223.0 150.0 - 400.0 K/uL   Neutrophils Relative % 58.4 43.0 - 77.0 %   Lymphocytes Relative 28.0 12.0 - 46.0 %   Monocytes Relative 8.8 3.0 - 12.0 %   Eosinophils Relative 4.3 0.0 - 5.0 %   Basophils Relative 0.5 0.0 - 3.0 %   Neutro Abs 4.6 1.4 - 7.7 K/uL   Lymphs Abs 2.2 0.7 - 4.0 K/uL   Monocytes Absolute 0.7 0.1 - 1.0 K/uL   Eosinophils Absolute 0.3 0.0 - 0.7 K/uL   Basophils Absolute 0.0 0.0 - 0.1 K/uL  Comprehensive metabolic panel     Status: Abnormal   Collection Time: 05/03/22 11:09 AM  Result Value Ref Range   Sodium 138 135 - 145 mEq/L   Potassium 3.5 3.5 - 5.1 mEq/L   Chloride 101 96 - 112 mEq/L   CO2 32 19 - 32 mEq/L   Glucose, Bld 110 (H) 70 - 99 mg/dL   BUN 14 6 - 23 mg/dL   Creatinine, Ser 7.82 0.40 - 1.50 mg/dL   Total Bilirubin 0.4 0.2 - 1.2 mg/dL   Alkaline Phosphatase 83 39 - 117 U/L   AST 48 (H) 0 - 37 U/L   ALT 20 0 - 53 U/L   Total Protein 6.5 6.0 - 8.3 g/dL   Albumin 3.7 3.5 - 5.2 g/dL   GFR 95.62 >13.08 mL/min    Comment: Calculated using the CKD-EPI Creatinine Equation (2021)   Calcium 8.6 8.4 - 10.5 mg/dL  Lipid panel     Status: None   Collection Time: 05/03/22 11:09 AM  Result Value Ref Range   Cholesterol 93 0 - 200 mg/dL    Comment: ATP III Classification       Desirable:  < 200 mg/dL               Borderline High:  200 - 239 mg/dL          High:  > = 657 mg/dL   Triglycerides 846.9 0.0 - 149.0 mg/dL    Comment: Normal:  <  150 mg/dLBorderline High:  150 - 199 mg/dL   HDL 16.10 >96.04 mg/dL   VLDL 54.0 0.0 - 98.1 mg/dL   LDL Cholesterol 13 0 - 99 mg/dL   Total CHOL/HDL Ratio 2     Comment:                Men          Women1/2 Average Risk     3.4          3.3Average Risk          5.0          4.42X Average Risk          9.6          7.13X Average Risk          15.0          11.0                       NonHDL 36.54     Comment: NOTE:  Non-HDL goal should be 30 mg/dL higher than patient's  LDL goal (i.e. LDL goal of < 70 mg/dL, would have non-HDL goal of < 100 mg/dL)  Hemoglobin X9J     Status: None   Collection Time: 05/03/22 11:09 AM  Result Value Ref Range   Hgb A1c MFr Bld 6.4 4.6 - 6.5 %    Comment: Glycemic Control Guidelines for People with Diabetes:Non Diabetic:  <6%Goal of Therapy: <7%Additional Action Suggested:  >8%   Microalbumin / creatinine urine ratio     Status: Abnormal   Collection Time: 05/03/22 11:09 AM  Result Value Ref Range   Microalb, Ur 5.4 (H) 0.0 - 1.9 mg/dL   Creatinine,U 47.8 mg/dL   Microalb Creat Ratio 16.2 0.0 - 30.0 mg/g  TSH     Status: None   Collection Time: 05/03/22 11:09 AM  Result Value Ref Range   TSH 1.88 0.35 - 5.50 uIU/mL  HIV antibody (with reflex)     Status: None   Collection Time: 05/03/22 11:09 AM  Result Value Ref Range   HIV 1&2 Ab, 4th Generation NON-REACTIVE NON-REACTIVE    Comment: HIV-1 antigen and HIV-1/HIV-2 antibodies were not detected. There is no laboratory evidence of HIV infection. Marland Kitchen PLEASE NOTE: This information has been disclosed to you from records whose confidentiality may be protected by state law.  If your state requires such protection, then the state law prohibits you from making any further disclosure of the information without the specific written consent of the person to whom it pertains, or as otherwise permitted by law. A general authorization for the release of medical or other information is NOT sufficient for this purpose. . For additional information please refer to http://education.quest[employer].com/faq/FAQ106 (This link is being provided for informational/ educational purposes only.) . Marland Kitchen The performance of this assay has not been clinically validated in patients less than 73 years old. .   Sedimentation rate     Status: None   Collection Time: 05/03/22 11:09 AM  Result Value Ref Range   Sed Rate 16 0 - 20 mm/hr  C-reactive protein     Status: None   Collection Time:  05/03/22 11:09 AM  Result Value Ref Range   CRP <1.0 0.5 - 20.0 mg/dL    No image results found.   No results found.   --------------------------------    Signed: Lula Olszewski, MD 06/05/2022 8:22 AM

## 2022-06-07 ENCOUNTER — Ambulatory Visit: Payer: Medicare HMO | Admitting: Internal Medicine

## 2022-06-07 DIAGNOSIS — G43009 Migraine without aura, not intractable, without status migrainosus: Secondary | ICD-10-CM | POA: Diagnosis not present

## 2022-06-07 DIAGNOSIS — R519 Headache, unspecified: Secondary | ICD-10-CM | POA: Diagnosis not present

## 2022-06-07 DIAGNOSIS — S0000XA Unspecified superficial injury of scalp, initial encounter: Secondary | ICD-10-CM | POA: Diagnosis not present

## 2022-06-07 DIAGNOSIS — G894 Chronic pain syndrome: Secondary | ICD-10-CM | POA: Diagnosis not present

## 2022-06-07 DIAGNOSIS — M961 Postlaminectomy syndrome, not elsewhere classified: Secondary | ICD-10-CM | POA: Diagnosis not present

## 2022-07-05 ENCOUNTER — Ambulatory Visit: Payer: Medicare HMO | Admitting: Internal Medicine

## 2022-07-07 DIAGNOSIS — M961 Postlaminectomy syndrome, not elsewhere classified: Secondary | ICD-10-CM | POA: Diagnosis not present

## 2022-07-07 DIAGNOSIS — F1027 Alcohol dependence with alcohol-induced persisting dementia: Secondary | ICD-10-CM | POA: Diagnosis not present

## 2022-07-07 DIAGNOSIS — G894 Chronic pain syndrome: Secondary | ICD-10-CM | POA: Diagnosis not present

## 2022-07-07 DIAGNOSIS — R519 Headache, unspecified: Secondary | ICD-10-CM | POA: Diagnosis not present

## 2022-07-29 DIAGNOSIS — R519 Headache, unspecified: Secondary | ICD-10-CM | POA: Diagnosis not present

## 2022-07-29 DIAGNOSIS — G894 Chronic pain syndrome: Secondary | ICD-10-CM | POA: Diagnosis not present

## 2022-07-29 DIAGNOSIS — F1027 Alcohol dependence with alcohol-induced persisting dementia: Secondary | ICD-10-CM | POA: Diagnosis not present

## 2022-07-29 DIAGNOSIS — M961 Postlaminectomy syndrome, not elsewhere classified: Secondary | ICD-10-CM | POA: Diagnosis not present

## 2022-08-02 ENCOUNTER — Other Ambulatory Visit: Payer: Self-pay | Admitting: Internal Medicine

## 2022-08-02 ENCOUNTER — Ambulatory Visit: Payer: Medicare HMO | Admitting: Internal Medicine

## 2022-08-02 DIAGNOSIS — G43E11 Chronic migraine with aura, intractable, with status migrainosus: Secondary | ICD-10-CM

## 2022-08-26 DIAGNOSIS — F1027 Alcohol dependence with alcohol-induced persisting dementia: Secondary | ICD-10-CM | POA: Diagnosis not present

## 2022-08-26 DIAGNOSIS — R519 Headache, unspecified: Secondary | ICD-10-CM | POA: Diagnosis not present

## 2022-08-26 DIAGNOSIS — G894 Chronic pain syndrome: Secondary | ICD-10-CM | POA: Diagnosis not present

## 2022-08-26 DIAGNOSIS — M961 Postlaminectomy syndrome, not elsewhere classified: Secondary | ICD-10-CM | POA: Diagnosis not present

## 2022-09-06 ENCOUNTER — Ambulatory Visit: Payer: Medicare HMO | Admitting: Internal Medicine

## 2022-09-23 DIAGNOSIS — M961 Postlaminectomy syndrome, not elsewhere classified: Secondary | ICD-10-CM | POA: Diagnosis not present

## 2022-09-23 DIAGNOSIS — F1027 Alcohol dependence with alcohol-induced persisting dementia: Secondary | ICD-10-CM | POA: Diagnosis not present

## 2022-09-23 DIAGNOSIS — G894 Chronic pain syndrome: Secondary | ICD-10-CM | POA: Diagnosis not present

## 2022-09-23 DIAGNOSIS — R519 Headache, unspecified: Secondary | ICD-10-CM | POA: Diagnosis not present

## 2022-10-13 VITALS — Wt 141.0 lb

## 2022-10-13 NOTE — Progress Notes (Signed)
   Nurse Notes: Pt was unreachable after  he hung up was not able to complete   This encounter was created in error - please disregard.

## 2022-10-19 ENCOUNTER — Other Ambulatory Visit: Payer: Self-pay | Admitting: Family Medicine

## 2022-10-19 ENCOUNTER — Other Ambulatory Visit: Payer: Self-pay | Admitting: Internal Medicine

## 2022-10-19 DIAGNOSIS — I739 Peripheral vascular disease, unspecified: Secondary | ICD-10-CM

## 2022-10-19 DIAGNOSIS — I700 Atherosclerosis of aorta: Secondary | ICD-10-CM

## 2022-10-19 DIAGNOSIS — E785 Hyperlipidemia, unspecified: Secondary | ICD-10-CM

## 2022-10-19 DIAGNOSIS — I10 Essential (primary) hypertension: Secondary | ICD-10-CM

## 2022-10-19 NOTE — Telephone Encounter (Signed)
Patient not seems to 2021.  I do think it is appropriate that he stay on his Protonix but for some reason he is not taking it.  I think he needs to stay on a PPI because he has had duodenal ulcer and stricture formation.  I see that he has been taking Pepcid but I do not think this is adequate.  Requested primary care refill the Protonix unless they have other concerns.

## 2022-10-21 ENCOUNTER — Other Ambulatory Visit: Payer: Self-pay | Admitting: Family Medicine

## 2022-10-21 DIAGNOSIS — I10 Essential (primary) hypertension: Secondary | ICD-10-CM

## 2022-10-27 ENCOUNTER — Other Ambulatory Visit: Payer: Self-pay

## 2022-10-27 ENCOUNTER — Encounter (HOSPITAL_BASED_OUTPATIENT_CLINIC_OR_DEPARTMENT_OTHER): Payer: Self-pay | Admitting: Emergency Medicine

## 2022-10-27 ENCOUNTER — Inpatient Hospital Stay (HOSPITAL_BASED_OUTPATIENT_CLINIC_OR_DEPARTMENT_OTHER)
Admission: EM | Admit: 2022-10-27 | Discharge: 2022-11-02 | DRG: 683 | Disposition: A | Discharge status: Home / Self Care | Payer: Medicare HMO | Attending: Internal Medicine | Admitting: Internal Medicine

## 2022-10-27 ENCOUNTER — Emergency Department (HOSPITAL_BASED_OUTPATIENT_CLINIC_OR_DEPARTMENT_OTHER): Payer: Medicare HMO

## 2022-10-27 DIAGNOSIS — Z8711 Personal history of peptic ulcer disease: Secondary | ICD-10-CM

## 2022-10-27 DIAGNOSIS — R3129 Other microscopic hematuria: Secondary | ICD-10-CM | POA: Diagnosis present

## 2022-10-27 DIAGNOSIS — Z8249 Family history of ischemic heart disease and other diseases of the circulatory system: Secondary | ICD-10-CM

## 2022-10-27 DIAGNOSIS — K8689 Other specified diseases of pancreas: Secondary | ICD-10-CM | POA: Diagnosis not present

## 2022-10-27 DIAGNOSIS — G259 Extrapyramidal and movement disorder, unspecified: Secondary | ICD-10-CM | POA: Diagnosis present

## 2022-10-27 DIAGNOSIS — N281 Cyst of kidney, acquired: Secondary | ICD-10-CM | POA: Diagnosis not present

## 2022-10-27 DIAGNOSIS — Y9301 Activity, walking, marching and hiking: Secondary | ICD-10-CM | POA: Diagnosis present

## 2022-10-27 DIAGNOSIS — Z9181 History of falling: Secondary | ICD-10-CM

## 2022-10-27 DIAGNOSIS — Z8673 Personal history of transient ischemic attack (TIA), and cerebral infarction without residual deficits: Secondary | ICD-10-CM

## 2022-10-27 DIAGNOSIS — E785 Hyperlipidemia, unspecified: Secondary | ICD-10-CM | POA: Diagnosis present

## 2022-10-27 DIAGNOSIS — I739 Peripheral vascular disease, unspecified: Secondary | ICD-10-CM | POA: Diagnosis present

## 2022-10-27 DIAGNOSIS — R9431 Abnormal electrocardiogram [ECG] [EKG]: Secondary | ICD-10-CM | POA: Diagnosis present

## 2022-10-27 DIAGNOSIS — R748 Abnormal levels of other serum enzymes: Secondary | ICD-10-CM | POA: Diagnosis present

## 2022-10-27 DIAGNOSIS — N401 Enlarged prostate with lower urinary tract symptoms: Secondary | ICD-10-CM | POA: Diagnosis present

## 2022-10-27 DIAGNOSIS — R531 Weakness: Secondary | ICD-10-CM | POA: Diagnosis not present

## 2022-10-27 DIAGNOSIS — N320 Bladder-neck obstruction: Secondary | ICD-10-CM | POA: Diagnosis present

## 2022-10-27 DIAGNOSIS — M549 Dorsalgia, unspecified: Secondary | ICD-10-CM | POA: Diagnosis present

## 2022-10-27 DIAGNOSIS — N179 Acute kidney failure, unspecified: Secondary | ICD-10-CM | POA: Diagnosis not present

## 2022-10-27 DIAGNOSIS — R251 Tremor, unspecified: Secondary | ICD-10-CM | POA: Diagnosis present

## 2022-10-27 DIAGNOSIS — N138 Other obstructive and reflux uropathy: Secondary | ICD-10-CM | POA: Diagnosis present

## 2022-10-27 DIAGNOSIS — R351 Nocturia: Secondary | ICD-10-CM | POA: Diagnosis present

## 2022-10-27 DIAGNOSIS — K7689 Other specified diseases of liver: Secondary | ICD-10-CM | POA: Diagnosis not present

## 2022-10-27 DIAGNOSIS — Z79891 Long term (current) use of opiate analgesic: Secondary | ICD-10-CM

## 2022-10-27 DIAGNOSIS — Z79899 Other long term (current) drug therapy: Secondary | ICD-10-CM

## 2022-10-27 DIAGNOSIS — R7401 Elevation of levels of liver transaminase levels: Secondary | ICD-10-CM | POA: Diagnosis present

## 2022-10-27 DIAGNOSIS — R7989 Other specified abnormal findings of blood chemistry: Secondary | ICD-10-CM | POA: Diagnosis present

## 2022-10-27 DIAGNOSIS — G629 Polyneuropathy, unspecified: Secondary | ICD-10-CM | POA: Diagnosis present

## 2022-10-27 DIAGNOSIS — T796XXA Traumatic ischemia of muscle, initial encounter: Secondary | ICD-10-CM | POA: Diagnosis present

## 2022-10-27 DIAGNOSIS — I10 Essential (primary) hypertension: Secondary | ICD-10-CM | POA: Diagnosis present

## 2022-10-27 DIAGNOSIS — D649 Anemia, unspecified: Secondary | ICD-10-CM | POA: Diagnosis present

## 2022-10-27 DIAGNOSIS — E876 Hypokalemia: Principal | ICD-10-CM | POA: Diagnosis present

## 2022-10-27 DIAGNOSIS — Z981 Arthrodesis status: Secondary | ICD-10-CM

## 2022-10-27 DIAGNOSIS — I700 Atherosclerosis of aorta: Secondary | ICD-10-CM | POA: Diagnosis not present

## 2022-10-27 DIAGNOSIS — G894 Chronic pain syndrome: Secondary | ICD-10-CM | POA: Diagnosis present

## 2022-10-27 DIAGNOSIS — R338 Other retention of urine: Secondary | ICD-10-CM | POA: Diagnosis present

## 2022-10-27 DIAGNOSIS — W1839XA Other fall on same level, initial encounter: Secondary | ICD-10-CM | POA: Diagnosis present

## 2022-10-27 DIAGNOSIS — Z936 Other artificial openings of urinary tract status: Secondary | ICD-10-CM

## 2022-10-27 DIAGNOSIS — Y92009 Unspecified place in unspecified non-institutional (private) residence as the place of occurrence of the external cause: Secondary | ICD-10-CM

## 2022-10-27 DIAGNOSIS — F39 Unspecified mood [affective] disorder: Secondary | ICD-10-CM | POA: Diagnosis present

## 2022-10-27 DIAGNOSIS — Z888 Allergy status to other drugs, medicaments and biological substances status: Secondary | ICD-10-CM

## 2022-10-27 DIAGNOSIS — N289 Disorder of kidney and ureter, unspecified: Secondary | ICD-10-CM

## 2022-10-27 DIAGNOSIS — R109 Unspecified abdominal pain: Secondary | ICD-10-CM | POA: Diagnosis not present

## 2022-10-27 DIAGNOSIS — J449 Chronic obstructive pulmonary disease, unspecified: Secondary | ICD-10-CM | POA: Diagnosis present

## 2022-10-27 DIAGNOSIS — K5900 Constipation, unspecified: Secondary | ICD-10-CM | POA: Diagnosis present

## 2022-10-27 DIAGNOSIS — K219 Gastro-esophageal reflux disease without esophagitis: Secondary | ICD-10-CM | POA: Diagnosis present

## 2022-10-27 DIAGNOSIS — F17210 Nicotine dependence, cigarettes, uncomplicated: Secondary | ICD-10-CM | POA: Diagnosis present

## 2022-10-27 LAB — COMPREHENSIVE METABOLIC PANEL
ALT: 49 U/L — ABNORMAL HIGH (ref 0–44)
AST: 126 U/L — ABNORMAL HIGH (ref 15–41)
Albumin: 2.9 g/dL — ABNORMAL LOW (ref 3.5–5.0)
Alkaline Phosphatase: 153 U/L — ABNORMAL HIGH (ref 38–126)
Anion gap: 11 (ref 5–15)
BUN: 26 mg/dL — ABNORMAL HIGH (ref 8–23)
CO2: 24 mmol/L (ref 22–32)
Calcium: 7.4 mg/dL — ABNORMAL LOW (ref 8.9–10.3)
Chloride: 99 mmol/L (ref 98–111)
Creatinine, Ser: 4.21 mg/dL — ABNORMAL HIGH (ref 0.61–1.24)
GFR, Estimated: 14 mL/min — ABNORMAL LOW (ref >60)
Glucose, Bld: 126 mg/dL — ABNORMAL HIGH (ref 70–99)
Potassium: 2.1 mmol/L — CL (ref 3.5–5.1)
Sodium: 134 mmol/L — ABNORMAL LOW (ref 135–145)
Total Bilirubin: 0.2 mg/dL — ABNORMAL LOW (ref 0.3–1.2)
Total Protein: 6.5 g/dL (ref 6.5–8.1)

## 2022-10-27 LAB — CBC
HCT: 30.7 % — ABNORMAL LOW (ref 39.0–52.0)
Hemoglobin: 10.5 g/dL — ABNORMAL LOW (ref 13.0–17.0)
MCH: 31.2 pg (ref 26.0–34.0)
MCHC: 34.2 g/dL (ref 30.0–36.0)
MCV: 91.1 fL (ref 80.0–100.0)
Platelets: 133 10*3/uL — ABNORMAL LOW (ref 150–400)
RBC: 3.37 MIL/uL — ABNORMAL LOW (ref 4.22–5.81)
RDW: 13.2 % (ref 11.5–15.5)
WBC: 5.4 10*3/uL (ref 4.0–10.5)
nRBC: 0 % (ref 0.0–0.2)

## 2022-10-27 LAB — URINALYSIS, ROUTINE W REFLEX MICROSCOPIC
Bilirubin Urine: NEGATIVE
Glucose, UA: NEGATIVE mg/dL
Ketones, ur: NEGATIVE mg/dL
Leukocytes,Ua: NEGATIVE
Nitrite: NEGATIVE
Protein, ur: 100 mg/dL — AB
Specific Gravity, Urine: 1.01 (ref 1.005–1.030)
pH: 6 (ref 5.0–8.0)

## 2022-10-27 LAB — URINALYSIS, MICROSCOPIC (REFLEX): Bacteria, UA: NONE SEEN

## 2022-10-27 LAB — MAGNESIUM: Magnesium: 1.9 mg/dL (ref 1.7–2.4)

## 2022-10-27 MED ORDER — POTASSIUM CHLORIDE 10 MEQ/100ML IV SOLN
10.0000 meq | Freq: Once | INTRAVENOUS | Status: AC
  Administered 2022-10-27: 10 meq via INTRAVENOUS
  Filled 2022-10-27: qty 100

## 2022-10-27 MED ORDER — POTASSIUM CHLORIDE CRYS ER 20 MEQ PO TBCR
40.0000 meq | EXTENDED_RELEASE_TABLET | Freq: Once | ORAL | Status: AC
  Administered 2022-10-27: 40 meq via ORAL
  Filled 2022-10-27: qty 2

## 2022-10-27 NOTE — Plan of Care (Addendum)
Plan of Care Note for accepted transfer   Patient name: Michael Bender YQM:[id number] DOB: September 08, 1945  Facility requesting transfer: [facility] Requesting Provider: Sabra Heck PA Reason for transfer: AKI, acute urinary retention, hypokalemia Facility course: 77 year old male with history of COPD, hypertension, history of GI bleed, anemia, hyperlipidemia presented to ED for evaluation of generalized weakness and had an episode 5 days ago where he collapsed due to generalized weakness but no syncope reported.  No head injury or other injuries reported.  Hypertensive in the ED but remainder of vital signs stable.  Labs showing no leukocytosis, hemoglobin 10.5, platelet count 133k, sodium 134, potassium 2.1, magnesium 1.9 BUN 26, creatinine 4.2 (baseline 1.0), calcium 7.4, albumin 2.9, AST 126, ALT 49, alk phos 153, T. bili 0.2, UA not suggestive of infection. Chest x-ray showing no active cardiopulmonary disease.  Patient reported difficulty urinating and postvoid residual volume >400 ml.  Foley placed in the ED.  CT abdomen pelvis showing: "IMPRESSION: 1. Negative for bowel obstruction or acute inflammatory process. 2. Extensive stool throughout the colon with hyperdense material, question constipation. 3. Distended urinary bladder with prominent renal collecting systems. 4. Atrophic pancreas with scattered calcifications presumably due to chronic pancreatitis. 5. Aortic atherosclerosis."  Patient was given oral and IV potassium.  Started on normal saline infusion.  Plan of care: The patient is accepted for admission to Telemetry unit at [facility].  [facility] will assume care on arrival to accepting facility. Until arrival, care as per EDP. However, [facility] available 24/7 for questions and assistance.  Author: John Giovanni, MD 09/05/2022  Check [email or web address] for on-call coverage.  Nursing staff, please call [facility] Admits & Consults System-Wide number under Amion on  patient's arrival so appropriate admitting provider can evaluate the pt.

## 2022-10-27 NOTE — ED Triage Notes (Addendum)
Per family  pt has been getting weaker x 1 week  family states may have lost weight no n/v/d no cough

## 2022-10-27 NOTE — ED Provider Notes (Signed)
[place] [facility]MENT AT [facility] Provider Note   CSN: [id number] Arrival date & time: 10/27/22  1753     History  Chief Complaint  Patient presents with   Weakness    Michael Bender is a 77 y.o. male past medical history of hypertension, hyperlipidemia, GERD, COPD, GIB, IDA presents to emergency department for evaluation of weakness that started on Sunday 10/23/2022.  He reports that he was walking at home and he collapsed due to weakness.  He denies seizure, syncope, injury from collapse.  He reports that, on Sunday, he was on the ground for 1 hour and unable to get to phone or get himself up due to weakness. He denies illness prior to weakness and collapse, cough, chest pain, SOB, dysuria, hematuria.  Son brought him to emergency department for evaluation.   Weakness Associated symptoms: no chest pain, no diarrhea, no fever, no shortness of breath and no vomiting       Home Medications Prior to Admission medications   Medication Sig Start Date End Date Taking? Authorizing Provider  cyclobenzaprine (FLEXERIL) 10 MG tablet Take 10 mg by mouth 3 (three) times daily as needed. 10/18/22  Yes [provider]  Oxycodone HCl 10 MG TABS Take 1 tablet by mouth every 8 (eight) hours as needed.   Yes [provider]  pantoprazole (PROTONIX) 40 MG tablet TAKE 1 TABLET BY MOUTH EVERY DAY BEFORE BREAKFAST Patient taking differently: Take 40 mg by mouth daily. 10/20/22  Yes Lula Olszewski, MD  rosuvastatin (CRESTOR) 40 MG tablet Take 1 tablet (40 mg total) by mouth daily. Replaces atorvastatin (stop atorvastatin if still taking) 03/03/22  Yes Lula Olszewski, MD  SUMAtriptan (IMITREX) 50 MG tablet Take 50 mg by mouth every 2 (two) hours as needed for migraine or headache. 10/19/22  Yes [provider]      Allergies    Nubain [nalbuphine hcl]    Review of Systems   Review of Systems  Constitutional:  Negative for fever.  HENT:  Negative  for congestion.   Respiratory:  Negative for chest tightness and shortness of breath.   Cardiovascular:  Negative for chest pain.  Gastrointestinal:  Negative for anal bleeding, diarrhea and vomiting.  Neurological:  Positive for weakness.    Physical Exam Updated Vital Signs BP (!) 163/70 (BP Location: Right Arm)   Pulse 75   Temp 98.2 F (36.8 C) (Oral)   Resp 14   Ht 5\' 11"  (1.803 m)   Wt 58.1 kg   SpO2 94%   BMI 17.86 kg/m  Physical Exam Constitutional:      Appearance: He is ill-appearing.     Comments: Low BMI  HENT:     Nose: Nose normal.  Eyes:     General: No scleral icterus.       Right eye: No discharge.        Left eye: No discharge.     Extraocular Movements: Extraocular movements intact.     Conjunctiva/sclera: Conjunctivae normal.     Pupils: Pupils are equal, round, and reactive to light.  Cardiovascular:     Rate and Rhythm: Normal rate.     Pulses: Normal pulses.  Pulmonary:     Effort: Pulmonary effort is normal. No respiratory distress.     Breath sounds: Normal breath sounds. No stridor. No wheezing or rhonchi.  Chest:     Chest wall: No tenderness.  Abdominal:     General: There is no distension.  Palpations: Abdomen is soft. There is no mass.     Tenderness: There is no abdominal tenderness.  Musculoskeletal:        General: No swelling, tenderness, deformity or signs of injury. Normal range of motion.     Cervical back: Neck supple. No rigidity or tenderness.     Right lower leg: No edema.     Left lower leg: No edema.  Lymphadenopathy:     Cervical: No cervical adenopathy.  Skin:    General: Skin is warm.     Capillary Refill: Capillary refill takes less than 2 seconds.     Coloration: Skin is not jaundiced or pale.  Neurological:     Mental Status: He is alert and oriented to person, place, and time.     Cranial Nerves: No cranial nerve deficit.     Sensory: No sensory deficit.     Motor: Weakness present.     Coordination:  Coordination normal.     Comments: Action tremor (at baseline but is worsening)     ED Results / Procedures / Treatments   Labs (all labs ordered are listed, but only abnormal results are displayed) Labs Reviewed  CBC - Abnormal; Notable for the following components:      Result Value   RBC 3.37 (*)    Hemoglobin 10.5 (*)    HCT 30.7 (*)    Platelets 133 (*)    All other components within normal limits  URINALYSIS, ROUTINE W REFLEX MICROSCOPIC - Abnormal; Notable for the following components:   Hgb urine dipstick LARGE (*)    Protein, ur 100 (*)    All other components within normal limits  COMPREHENSIVE METABOLIC PANEL - Abnormal; Notable for the following components:   Sodium 134 (*)    Potassium 2.1 (*)    Glucose, Bld 126 (*)    BUN 26 (*)    Creatinine, Ser 4.21 (*)    Calcium 7.4 (*)    Albumin 2.9 (*)    AST 126 (*)    ALT 49 (*)    Alkaline Phosphatase 153 (*)    Total Bilirubin 0.2 (*)    GFR, Estimated 14 (*)    All other components within normal limits  CK - Abnormal; Notable for the following components:   Total CK 2,828 (*)    All other components within normal limits  BASIC METABOLIC PANEL - Abnormal; Notable for the following components:   Potassium 2.6 (*)    CO2 20 (*)    BUN 25 (*)    Creatinine, Ser 3.62 (*)    Calcium 7.6 (*)    GFR, Estimated 17 (*)    All other components within normal limits  MAGNESIUM - Abnormal; Notable for the following components:   Magnesium 2.5 (*)    All other components within normal limits  CK - Abnormal; Notable for the following components:   Total CK 2,043 (*)    All other components within normal limits  HEPATIC FUNCTION PANEL - Abnormal; Notable for the following components:   Total Protein 5.5 (*)    Albumin 2.4 (*)    AST 95 (*)    Alkaline Phosphatase 134 (*)    All other components within normal limits  MAGNESIUM  URINALYSIS, MICROSCOPIC (REFLEX)  PHOSPHORUS  VITAMIN B12  BASIC METABOLIC PANEL  CK     EKG EKG Interpretation Date/Time:  Thursday October 27 2022 18:09:12 EDT Ventricular Rate:  85 PR Interval:  173 QRS Duration:  99 QT  Interval:  439 QTC Calculation: 523 R Axis:   64  Text Interpretation: Sinus rhythm Minimal ST depression, lateral leads Prolonged QT interval Confirmed by Vanetta Mulders [phone]) on 10/27/2022 7:47:29 PM  Radiology CT HEAD WO CONTRAST ( )  Result Date: 10/28/2022 CLINICAL DATA:  77 year old male with falls, head injury. EXAM: CT HEAD WITHOUT CONTRAST TECHNIQUE: Contiguous axial images were obtained from the base of the skull through the vertex without intravenous contrast. RADIATION DOSE REDUCTION: This exam was performed according to the departmental dose-optimization program which includes automated exposure control, adjustment of the mA and/or kV according to patient size and/or use of iterative reconstruction technique. COMPARISON:  Brain MRI 02/16/2019. FINDINGS: Brain: Cerebral volume appears stable since 2021. Chronic lacunar infarct of the left thalamus was present at that time, but a larger chronic appearing lacunar infarct of the left corona radiata and lentiform is new since that time. Patchy white matter hypodensity elsewhere not significantly changed. Posterior fossa remains within normal limits. No midline shift, ventriculomegaly, mass effect, evidence of mass lesion, intracranial hemorrhage or evidence of cortically based acute infarction. Vascular: Calcified atherosclerosis at the skull base. No suspicious intracranial vascular hyperdensity. Skull: No acute osseous abnormality identified. Sinuses/Orbits: Chronic paranasal sinus disease, especially bulky sphenoid mucoperiosteal thickening. But other paranasal sinuses, tympanic cavities and mastoids remain well aerated. Other: No acute orbit or scalp soft tissue injury identified. Postoperative changes to the globes. IMPRESSION: 1. No acute intracranial abnormality or acute traumatic injury  identified. 2. Progressed chronic small vessel disease since 2021 in the left corona radiata, left basal ganglia. Electronically Signed   By: Odessa Fleming M.D.   On: 10/28/2022 05:45   DG Chest 2 View  Result Date: 10/27/2022 CLINICAL DATA:  Progressive weakness EXAM: CHEST - 2 VIEW COMPARISON:  07/17/2019 FINDINGS: The heart size and mediastinal contours are within normal limits. Aortic atherosclerosis. Both lungs are clear. The visualized skeletal structures are unremarkable. IMPRESSION: No active cardiopulmonary disease. Electronically Signed   By: Jasmine Pang M.D.   On: 10/27/2022 22:53   CT ABDOMEN PELVIS WO CONTRAST  Result Date: 10/27/2022 CLINICAL DATA:  Abdominal pain EXAM: CT ABDOMEN AND PELVIS WITHOUT CONTRAST TECHNIQUE: Multidetector CT imaging of the abdomen and pelvis was performed following the standard protocol without IV contrast. RADIATION DOSE REDUCTION: This exam was performed according to the departmental dose-optimization program which includes automated exposure control, adjustment of the mA and/or kV according to patient size and/or use of iterative reconstruction technique. COMPARISON:  CT 07/17/2019 FINDINGS: Lower chest: Lung bases demonstrate no acute airspace disease. Mild coronary vascular calcification. Hepatobiliary: No calcified gallstone or biliary dilatation. Hepatic cyst in the left hepatic dome. Pancreas: Markedly atrophic. Scattered calcifications presumably due to chronic pancreatitis. No acute inflammation Spleen: Normal in size without focal abnormality. Adrenals/Urinary Tract: Adrenal glands are normal. Prominent renal collecting systems without obstructing stone. Distended urinary bladder. Renal cysts. No imaging follow-up is recommended Stomach/Bowel: The stomach is nonenlarged. There is no dilated small bowel. Extensive stool throughout the colon with hyperdense material. Postsurgical changes at the rectum Vascular/Lymphatic: Moderate aortic atherosclerosis. No  aneurysm. No suspicious lymph nodes Reproductive: Prostate unremarkable Other: Negative for pelvic effusion or free air. Musculoskeletal: No acute or suspicious osseous abnormality. Posterior spinal fusion hardware at L4-L5. IMPRESSION: 1. Negative for bowel obstruction or acute inflammatory process. 2. Extensive stool throughout the colon with hyperdense material, question constipation. 3. Distended urinary bladder with prominent renal collecting systems. 4. Atrophic pancreas with scattered calcifications presumably due to chronic pancreatitis. 5. Aortic  atherosclerosis. Aortic Atherosclerosis (ICD10-I70.0). Electronically Signed   By: Jasmine Pang M.D.   On: 10/27/2022 22:51    Procedures .Critical Care  Performed by: Judithann Sheen, PA Authorized by: Judithann Sheen, PA   Critical care provider statement:    Critical care time (minutes):  40   Critical care time was exclusive of:  Separately billable procedures and treating other patients and teaching time   Critical care was necessary to treat or prevent imminent or life-threatening deterioration of the following conditions:  Renal failure and metabolic crisis   Critical care was time spent personally by me on the following activities:  Development of treatment plan with patient or surrogate, discussions with consultants, evaluation of patient's response to treatment, examination of patient, ordering and review of laboratory studies, ordering and review of radiographic studies, ordering and performing treatments and interventions, pulse oximetry, re-evaluation of patient's condition and review of old charts   Care discussed with: admitting provider       Medications Ordered in ED Medications  sodium chloride flush (NS) 0.9 % injection 3 mL (3 mLs Intravenous Given 10/28/22 0919)  acetaminophen (TYLENOL) tablet 1,000 mg (has no administration in time range)  polyethylene glycol (MIRALAX / GLYCOLAX) packet 17 g (17 g Oral Given 10/28/22 0919)   senna (SENOKOT) tablet 17.2 mg (17.2 mg Oral Given 10/28/22 0918)  lactated ringers infusion ( Intravenous New Bag/Given 10/28/22 0340)  oxyCODONE (Oxy IR/ROXICODONE) immediate release tablet 10 mg (10 mg Oral Given 10/28/22 1627)  amitriptyline (ELAVIL) tablet 25 mg (25 mg Oral Given 10/28/22 1627)  pantoprazole (PROTONIX) EC tablet 40 mg (40 mg Oral Given 10/28/22 0918)  tamsulosin (FLOMAX) capsule 0.4 mg (has no administration in time range)  amLODipine (NORVASC) tablet 10 mg (has no administration in time range)  potassium chloride SA (KLOR-CON M) CR tablet 40 mEq (40 mEq Oral Given 10/27/22 2023)  potassium chloride 10 mEq in 100 mL IVPB (0 mEq Intravenous Stopped 10/27/22 2140)  potassium chloride 10 mEq in 100 mL IVPB (10 mEq Intravenous New Bag/Given 10/28/22 0630)  magnesium sulfate IVPB 2 g 50 mL (0 g Intravenous Stopped 10/28/22 0537)  potassium chloride SA (KLOR-CON M) CR tablet 40 mEq (40 mEq Oral Given 10/28/22 1627)    ED Course/ Medical Decision Making/ A&P Clinical Course as of 10/28/22 1710  Thu Oct 27, 2022  2002 Potassium(!!): 2.1 Supplemented with oral and IVPB [LB]    Clinical Course User Index [LB] Judithann Sheen, PA                                 Medical Decision Making Amount and/or Complexity of Data Reviewed Labs: ordered. Decision-making details documented in ED Course. Radiology: ordered.  Risk Prescription drug management. Decision regarding hospitalization.   Patient presents to the ED for concern of weakness and collapse on Sunday, this involves an extensive number of treatment options, and is a complaint that carries with it a high risk of complications and morbidity.  The differential diagnosis includes electrolyte abnormalities, UTI, pneumonia, anemia, stroke, malignancy.   Additional history obtained:  Additional history obtained from son at bedside External records from outside source obtained upon chart review   Lab Tests:  I Ordered, and  personally interpreted labs.  The pertinent results include: Hypokalemia (2.1) and elevated creatinine from normal baseline (4.21)   Imaging Studies ordered:  I ordered imaging studies including CT abdomen pelvis without contrast and  chest x-ray I independently visualized and interpreted CT abd/pelvis imaging which showed evidence of stool, a distended urinary bladder, atrophic pancreas but no bowel obstruction or acute inflammatory process I independently visualized and interpreted CXR showed no acute cardiopulmonary pathology I agree with the radiologist interpretation   Cardiac Monitoring:  The patient was maintained on a cardiac monitor.  I personally viewed and interpreted the cardiac monitored which showed an underlying rhythm of: NSR with prolonged QT   Medicines ordered and prescription drug management:  I ordered medication including potassium 10 mEq IV PB and potassium 40 mEq p.o. for potassium supplementation Reevaluation of the patient after these medicines showed that the patient stayed the same I have reviewed the patients home medicines and have made adjustments as needed    Consultations Obtained:  I requested consultation with the hospitalist,  and discussed lab and imaging findings as well as pertinent plan - they recommend: Admission for AKI and hypokalemia   Problem List / ED Course:  AKI Hypokalemia Weakness   Reevaluation:  After the interventions noted above, I reevaluated the patient and found that they have :stayed the same   Dispostion:  After consideration of the diagnostic results and the patients response to treatment, I feel that the patent would benefit from admission for AKI and hyperkalemia.  Upon evaluation, patient is weak, ill-appearing with low BMI. Per family, there is active weight loss.  He complains of generalized weakness.  No complaints of chest pain, shortness of breath. Lab work is significant for elevated creatinine (4.21) which  is elevated from his baseline that is WNL, hypokalemia (2.1), transaminitis, hypocalcemia (7.4). EKG shows NSR with no changes from hypokalemia. UA negative for acute infection.  Supplemented potassium via IV and p.o.  Patient voided 500 mL twice but did not have feeling of complete emptying.  Abdomen pelvis negative for acute intra-abdominal pathology but is notable for enlarged bladder and large amounts of stool. Bladder scan is significant for a postvoid of 450 mL. Post void catheter inserted for incomplete voiding. AKI likely related to post renal source such as obstruction or enlarged prostate. Dr. Loney Loh hospitalist consulted for admission and accepted patient to service.  Dr. Deretha Emory independently assessed patient, treatment plan discussed with him and he agrees with plan.         Final Clinical Impression(s) / ED Diagnoses Final diagnoses:  Hypokalemia  Renal insufficiency  Acute urinary retention  Constipation, unspecified constipation type    Rx / DC Orders ED Discharge Orders     None         Judithann Sheen, PA 10/28/22 1711    Vanetta Mulders, MD 10/29/22 2321

## 2022-10-27 NOTE — ED Notes (Signed)
Patient taken to CT.

## 2022-10-27 NOTE — Plan of Care (Incomplete)
Plan of Care Note for accepted transfer   Patient name: Michael Bender ZOX:[id number] DOB: October 17, 1945  Facility requesting transfer: *** Requesting Provider: *** Reason for transfer: *** Facility course: 77 year old male with history of COPD, hypertension, history of GI bleed, anemia, hyperlipidemia presented to ED for evaluation of generalized weakness x 5 days.  Hypertensive with systolic  Chest x-ray showing no active cardiopulmonary disease.  CT abdomen pelvis showing: "IMPRESSION: 1. Negative for bowel obstruction or acute inflammatory process. 2. Extensive stool throughout the colon with hyperdense material, question constipation. 3. Distended urinary bladder with prominent renal collecting systems. 4. Atrophic pancreas with scattered calcifications presumably due to chronic pancreatitis. 5. Aortic atherosclerosis."  Plan of care: The patient is accepted for admission to {Level_of_Care:26774} unit at {Campus_List:26775}.  [facility] will assume care on arrival to accepting facility. Until arrival, care as per EDP. However, [facility] available 24/7 for questions and assistance.  Author: John Giovanni, MD 09/05/2022  Check [email or web address] for on-call coverage.  Nursing staff, please call [facility] Admits & Consults System-Wide number under Amion on patient's arrival so appropriate admitting provider can evaluate the pt.

## 2022-10-27 NOTE — ED Notes (Signed)
Patient feeling weak for the last month.  Has not been able to urinate well during the day.

## 2022-10-28 ENCOUNTER — Inpatient Hospital Stay (HOSPITAL_COMMUNITY): Payer: Medicare HMO

## 2022-10-28 DIAGNOSIS — I10 Essential (primary) hypertension: Secondary | ICD-10-CM | POA: Diagnosis not present

## 2022-10-28 DIAGNOSIS — G259 Extrapyramidal and movement disorder, unspecified: Secondary | ICD-10-CM | POA: Diagnosis not present

## 2022-10-28 DIAGNOSIS — S0990XA Unspecified injury of head, initial encounter: Secondary | ICD-10-CM | POA: Diagnosis not present

## 2022-10-28 DIAGNOSIS — R7401 Elevation of levels of liver transaminase levels: Secondary | ICD-10-CM | POA: Diagnosis present

## 2022-10-28 DIAGNOSIS — F39 Unspecified mood [affective] disorder: Secondary | ICD-10-CM | POA: Diagnosis not present

## 2022-10-28 DIAGNOSIS — E785 Hyperlipidemia, unspecified: Secondary | ICD-10-CM | POA: Diagnosis not present

## 2022-10-28 DIAGNOSIS — Y9301 Activity, walking, marching and hiking: Secondary | ICD-10-CM | POA: Diagnosis not present

## 2022-10-28 DIAGNOSIS — R251 Tremor, unspecified: Secondary | ICD-10-CM | POA: Diagnosis not present

## 2022-10-28 DIAGNOSIS — T796XXA Traumatic ischemia of muscle, initial encounter: Secondary | ICD-10-CM | POA: Diagnosis not present

## 2022-10-28 DIAGNOSIS — Z9181 History of falling: Secondary | ICD-10-CM | POA: Diagnosis not present

## 2022-10-28 DIAGNOSIS — R9431 Abnormal electrocardiogram [ECG] [EKG]: Secondary | ICD-10-CM | POA: Diagnosis not present

## 2022-10-28 DIAGNOSIS — N179 Acute kidney failure, unspecified: Secondary | ICD-10-CM

## 2022-10-28 DIAGNOSIS — W1839XA Other fall on same level, initial encounter: Secondary | ICD-10-CM | POA: Diagnosis not present

## 2022-10-28 DIAGNOSIS — J449 Chronic obstructive pulmonary disease, unspecified: Secondary | ICD-10-CM | POA: Diagnosis not present

## 2022-10-28 DIAGNOSIS — N138 Other obstructive and reflux uropathy: Secondary | ICD-10-CM | POA: Diagnosis not present

## 2022-10-28 DIAGNOSIS — E876 Hypokalemia: Secondary | ICD-10-CM | POA: Diagnosis not present

## 2022-10-28 DIAGNOSIS — M549 Dorsalgia, unspecified: Secondary | ICD-10-CM | POA: Diagnosis not present

## 2022-10-28 DIAGNOSIS — N401 Enlarged prostate with lower urinary tract symptoms: Secondary | ICD-10-CM | POA: Diagnosis not present

## 2022-10-28 DIAGNOSIS — G894 Chronic pain syndrome: Secondary | ICD-10-CM | POA: Diagnosis not present

## 2022-10-28 DIAGNOSIS — R338 Other retention of urine: Secondary | ICD-10-CM | POA: Diagnosis not present

## 2022-10-28 DIAGNOSIS — R531 Weakness: Secondary | ICD-10-CM | POA: Diagnosis not present

## 2022-10-28 DIAGNOSIS — K5900 Constipation, unspecified: Secondary | ICD-10-CM | POA: Diagnosis present

## 2022-10-28 DIAGNOSIS — R3129 Other microscopic hematuria: Secondary | ICD-10-CM | POA: Diagnosis not present

## 2022-10-28 DIAGNOSIS — I739 Peripheral vascular disease, unspecified: Secondary | ICD-10-CM | POA: Diagnosis not present

## 2022-10-28 DIAGNOSIS — D649 Anemia, unspecified: Secondary | ICD-10-CM | POA: Diagnosis not present

## 2022-10-28 DIAGNOSIS — Z936 Other artificial openings of urinary tract status: Secondary | ICD-10-CM | POA: Diagnosis not present

## 2022-10-28 DIAGNOSIS — Y92009 Unspecified place in unspecified non-institutional (private) residence as the place of occurrence of the external cause: Secondary | ICD-10-CM | POA: Diagnosis not present

## 2022-10-28 DIAGNOSIS — N320 Bladder-neck obstruction: Secondary | ICD-10-CM | POA: Diagnosis not present

## 2022-10-28 HISTORY — DX: Acute kidney failure, unspecified: N17.9

## 2022-10-28 LAB — HEPATIC FUNCTION PANEL
ALT: 39 U/L (ref 0–44)
AST: 95 U/L — ABNORMAL HIGH (ref 15–41)
Albumin: 2.4 g/dL — ABNORMAL LOW (ref 3.5–5.0)
Alkaline Phosphatase: 134 U/L — ABNORMAL HIGH (ref 38–126)
Bilirubin, Direct: 0.2 mg/dL (ref 0.0–0.2)
Indirect Bilirubin: 0.3 mg/dL (ref 0.3–0.9)
Total Bilirubin: 0.5 mg/dL (ref 0.3–1.2)
Total Protein: 5.5 g/dL — ABNORMAL LOW (ref 6.5–8.1)

## 2022-10-28 LAB — BASIC METABOLIC PANEL
Anion gap: 14 (ref 5–15)
BUN: 25 mg/dL — ABNORMAL HIGH (ref 8–23)
CO2: 20 mmol/L — ABNORMAL LOW (ref 22–32)
Calcium: 7.6 mg/dL — ABNORMAL LOW (ref 8.9–10.3)
Chloride: 104 mmol/L (ref 98–111)
Creatinine, Ser: 3.62 mg/dL — ABNORMAL HIGH (ref 0.61–1.24)
GFR, Estimated: 17 mL/min — ABNORMAL LOW (ref >60)
Glucose, Bld: 82 mg/dL (ref 70–99)
Potassium: 2.6 mmol/L — CL (ref 3.5–5.1)
Sodium: 138 mmol/L (ref 135–145)

## 2022-10-28 LAB — CK
Total CK: 2828 U/L — ABNORMAL HIGH (ref 49–397)
Total CK: 2043 U/L — ABNORMAL HIGH (ref 49–397)

## 2022-10-28 LAB — MAGNESIUM: Magnesium: 2.5 mg/dL — ABNORMAL HIGH (ref 1.7–2.4)

## 2022-10-28 LAB — PHOSPHORUS: Phosphorus: 3.7 mg/dL (ref 2.5–4.6)

## 2022-10-28 LAB — VITAMIN B12: Vitamin B-12: 676 pg/mL (ref 180–914)

## 2022-10-28 MED ORDER — AMLODIPINE BESYLATE 5 MG PO TABS
5.0000 mg | ORAL_TABLET | Freq: Every day | ORAL | Status: DC
  Administered 2022-10-28: 5 mg via ORAL
  Filled 2022-10-28: qty 1

## 2022-10-28 MED ORDER — AMLODIPINE BESYLATE 10 MG PO TABS
10.0000 mg | ORAL_TABLET | Freq: Every day | ORAL | Status: DC
  Administered 2022-10-29 – 2022-11-02 (×5): 10 mg via ORAL
  Filled 2022-10-28 (×5): qty 1

## 2022-10-28 MED ORDER — LACTATED RINGERS IV SOLN
INTRAVENOUS | Status: DC
  Filled 2022-10-28: qty 1000

## 2022-10-28 MED ORDER — TAMSULOSIN HCL 0.4 MG PO CAPS
0.4000 mg | ORAL_CAPSULE | Freq: Every day | ORAL | Status: DC
  Administered 2022-10-28 – 2022-11-01 (×5): 0.4 mg via ORAL
  Filled 2022-10-28 (×5): qty 1

## 2022-10-28 MED ORDER — OXYCODONE HCL 5 MG PO TABS
10.0000 mg | ORAL_TABLET | Freq: Three times a day (TID) | ORAL | Status: DC | PRN
  Administered 2022-10-28 – 2022-11-01 (×9): 10 mg via ORAL
  Filled 2022-10-28 (×9): qty 2

## 2022-10-28 MED ORDER — SENNA 8.6 MG PO TABS
2.0000 | ORAL_TABLET | Freq: Every day | ORAL | Status: DC
  Administered 2022-10-28 – 2022-11-02 (×6): 17.2 mg via ORAL
  Filled 2022-10-28 (×6): qty 2

## 2022-10-28 MED ORDER — SODIUM CHLORIDE 0.9% FLUSH
3.0000 mL | Freq: Two times a day (BID) | INTRAVENOUS | Status: DC
  Administered 2022-10-28 – 2022-11-02 (×6): 3 mL via INTRAVENOUS

## 2022-10-28 MED ORDER — ACETAMINOPHEN 500 MG PO TABS
1000.0000 mg | ORAL_TABLET | Freq: Four times a day (QID) | ORAL | Status: DC | PRN
  Administered 2022-10-30: 1000 mg via ORAL
  Filled 2022-10-28: qty 2

## 2022-10-28 MED ORDER — POTASSIUM CHLORIDE CRYS ER 20 MEQ PO TBCR
40.0000 meq | EXTENDED_RELEASE_TABLET | ORAL | Status: AC
  Administered 2022-10-28 (×3): 40 meq via ORAL
  Filled 2022-10-28 (×3): qty 2

## 2022-10-28 MED ORDER — PANTOPRAZOLE SODIUM 40 MG PO TBEC
40.0000 mg | DELAYED_RELEASE_TABLET | Freq: Every day | ORAL | Status: DC
  Administered 2022-10-28 – 2022-11-02 (×6): 40 mg via ORAL
  Filled 2022-10-28 (×6): qty 1

## 2022-10-28 MED ORDER — SODIUM CHLORIDE 0.9 % IV SOLN: INTRAVENOUS | Status: DC

## 2022-10-28 MED ORDER — POTASSIUM CHLORIDE CRYS ER 20 MEQ PO TBCR
40.0000 meq | EXTENDED_RELEASE_TABLET | ORAL | Status: DC
Start: 2022-10-28 — End: 2022-10-28

## 2022-10-28 MED ORDER — POTASSIUM CHLORIDE 10 MEQ/100ML IV SOLN
10.0000 meq | INTRAVENOUS | Status: AC
  Administered 2022-10-28 (×3): 10 meq via INTRAVENOUS
  Filled 2022-10-28 (×3): qty 100

## 2022-10-28 MED ORDER — MAGNESIUM SULFATE 2 GM/50ML IV SOLN
2.0000 g | Freq: Once | INTRAVENOUS | Status: AC
  Administered 2022-10-28: 2 g via INTRAVENOUS
  Filled 2022-10-28: qty 50

## 2022-10-28 MED ORDER — HEPARIN SODIUM (PORCINE) 5000 UNIT/ML IJ SOLN: 5000.0000 [IU] | Freq: Three times a day (TID) | INTRAMUSCULAR | Status: DC

## 2022-10-28 MED ORDER — POLYETHYLENE GLYCOL 3350 17 G PO PACK
17.0000 g | PACK | Freq: Two times a day (BID) | ORAL | Status: DC
  Administered 2022-10-28 – 2022-11-01 (×9): 17 g via ORAL
  Filled 2022-10-28 (×10): qty 1

## 2022-10-28 MED ORDER — AMITRIPTYLINE HCL 25 MG PO TABS
25.0000 mg | ORAL_TABLET | Freq: Three times a day (TID) | ORAL | Status: DC
  Administered 2022-10-28 – 2022-10-29 (×4): 25 mg via ORAL
  Filled 2022-10-28 (×4): qty 1

## 2022-10-28 NOTE — Progress Notes (Addendum)
Brief same day note:  Patient is a 77 year old male with history of COPD, hypertension, hyperlipidemia, CVA, peripheral artery disease, history of chronic pain syndrome on opiates who initially presented with 3 weeks history of generalized weakness, falls.  Lab work on presentation showed potassium of 2.1, creatinine of 4.1, AST 126, CK of 2828.  Patient lives alone with his dog.  Patient seen and examined at the bedside today.  During my evaluation, he looked comfortable.  Hemodynamically stable.  Feels much better today.  Alert and oriented.  But appears very deconditioned   Assessment and plan:  AKI: Baseline creatinine normal.  Creatinine elevated at 4.2 on presentation.  CT showed distended bladder, renal collecting system.  Likely from bladder outlet obstruction and BPH.  Patient was also on amitriptyline, cyclobenzaprine at home.  Started on tamsulosin.  Foley placed.  Monitor BMP, improving.  We may need to continue the Foley for now, and can give voiding trial before discharge  Generalized weakness/frequent falls: Likely from uremia, electrolyte imbalances.  CT head did not show any acute findings.  Currently alert and oriented. PT/OT evaluation requested.  Patient lives alone  Rhabdomyolysis: Likely secondary to fall.  Continue IV fluids.  Monitor  Hypokalemia/hypomagnesemia: Currently being monitored and supplemented as needed  Prolonged QTc: Qtc of 523. EKG monitoring  History of movement disorder: Will recommend to follow-up with neurology as an outpatient.  Constipation: Continue bowel regimen  Elevated liver enzymes: This is associated with rhabdo.  Improving  I called and had extensive discussion with his son about management plan on phone today

## 2022-10-28 NOTE — ED Notes (Signed)
Foley clamped at this time

## 2022-10-28 NOTE — Evaluation (Signed)
Physical Therapy Evaluation Patient Details Name: Michael Bender MRN: [id number] DOB: 12-Aug-1945 Today's Date: 10/28/2022  History of Present Illness  77 yo male presents to [facility] on 9/26 with weakness, AKI, acute urinary retention. CTH negative for acute findings. + rhabdomyolysis. PMH includes COPD, hypertension, history of GI bleed, anemia, hyperlipidemia, CVA, PAD, chronic pain, cervical fusion.  Clinical Impression   Pt presents with debility, impaired activity tolerance, incoordination, impaired balance with history of frequent falls. Pt to benefit from acute PT to address deficits. Pt ambulated hallway distance with contact guard assist given high fall risk, pt with posterior leaning with fatigue but pt able to correct. Pt struggles with any challenge to dynamic standing balance (head turns, direction change, gait speed change), at high risk for continued falls and pt lives alone. Pt very clearly states he does not wish to d/c to a rehab facility, states he would like to d/c home with his dog and he understands the risk of further falls. PT recommending increase family support, life alert system given high fall risk, and HHPT. PT to progress mobility as tolerated, and will continue to follow acutely.          If plan is discharge home, recommend the following: A little help with walking and/or transfers;A little help with bathing/dressing/bathroom;Help with stairs or ramp for entrance   Can travel by private vehicle        Equipment Recommendations None recommended by PT  Recommendations for Other Services       Functional Status Assessment Patient has had a recent decline in their functional status and demonstrates the ability to make significant improvements in function in a reasonable and predictable amount of time.     Precautions / Restrictions Precautions Precautions: Fall Restrictions Weight Bearing Restrictions: No      Mobility  Bed Mobility Overal bed mobility:  Needs Assistance Bed Mobility: Supine to Sit     Supine to sit: Supervision, HOB elevated, Used rails     General bed mobility comments: increased time, use of bedrails    Transfers Overall transfer level: Needs assistance Equipment used: None Transfers: Sit to/from Stand Sit to Stand: Contact guard assist           General transfer comment: for safety, slow to rise    Ambulation/Gait Ambulation/Gait assistance: Contact guard assist Gait Distance (Feet): 450 Feet Assistive device: None Gait Pattern/deviations: Step-through pattern, Decreased stride length, Narrow base of support Gait velocity: decr     General Gait Details: heavy heel strike, holding onto pt gait belt for balance if needed. Pt with posterior bias especially with fatigue. difficulty with balance challenges (see balance section)  Stairs            Wheelchair Mobility     Tilt Bed    Modified Rankin (Stroke Patients Only)       Balance Overall balance assessment: Needs assistance, History of Falls Sitting-balance support: No upper extremity supported, Feet supported Sitting balance-Leahy Scale: Good     Standing balance support: During functional activity, No upper extremity supported Standing balance-Leahy Scale: Fair               High level balance activites: Direction changes, Turns, Head turns High Level Balance Comments: Stop/start gait pt with near-scissoring of gait and reaching out for environment to self-steady; weaving of gait with horizontal head turns. Increased time to perform directional changes, pt unable to perform vertical head turns given neck stiffness (history of cervical fusion)  Pertinent Vitals/Pain Pain Assessment Pain Assessment: Faces Faces Pain Scale: No hurt Pain Intervention(s): Monitored during session    Home Living Family/patient expects to be discharged to:: Private residence Living Arrangements: Alone Available Help at  Discharge: Family;Available PRN/intermittently (one son lives closeby) Type of Home: House Home Access: Stairs to enter   [employer] of Steps: 1   Home Layout: One level Home Equipment: None      Prior Function Prior Level of Function : Independent/Modified Independent;History of Falls (last six months)             Mobility Comments: pt reports no AD use, states he has had countless falls ADLs Comments: no assist for ADLs, takes care of himself and dog     Extremity/Trunk Assessment   Upper Extremity Assessment Upper Extremity Assessment: Defer to OT evaluation    Lower Extremity Assessment Lower Extremity Assessment: Generalized weakness;LLE deficits/detail;RLE deficits/detail RLE Deficits / Details: incoordination worse R>L with heel-to-shin test; poor motor grading with heavy foot fall in hallway. Pt states sensation WFL RLE Coordination: decreased gross motor;decreased fine motor LLE Deficits / Details: incoordination worse R>L with heel-to-shin test; poor motor grading with heavy foot fall in hallway. Pt states sensation WFL LLE Coordination: decreased gross motor;decreased fine motor    Cervical / Trunk Assessment Cervical / Trunk Assessment: Normal  Communication   Communication Communication: Hearing impairment Cueing Techniques: Verbal cues;Tactile cues  Cognition Arousal: Alert Behavior During Therapy: Flat affect Overall Cognitive Status: No family/caregiver present to determine baseline cognitive functioning Area of Impairment: Following commands, Safety/judgement, Problem solving                       Following Commands: Follows one step commands with increased time Safety/Judgement: Decreased awareness of safety, Decreased awareness of deficits   Problem Solving: Slow processing, Requires verbal cues, Requires tactile cues General Comments: pt lacks insight into deficits, states "you all are worried about it (d/c home), but that is  what I am doing". Pt does endorse fear of falling from standpoint of falling and not being able to get himself up, or falling in home where he lives alone.        General Comments      Exercises     Assessment/Plan    PT Assessment Patient needs continued PT services  PT Problem List Decreased strength;Decreased mobility;Decreased coordination;Decreased safety awareness;Decreased balance;Pain;Decreased activity tolerance;Decreased cognition;Decreased knowledge of precautions       PT Treatment Interventions DME instruction;Therapeutic activities;Gait training;Therapeutic exercise;Patient/family education;Balance training;Stair training;Functional mobility training;Neuromuscular re-education    PT Goals (Current goals can be found in the Care Plan section)  Acute Rehab PT Goals Patient Stated Goal: home PT Goal Formulation: With patient Time For Goal Achievement: 11/11/22 Potential to Achieve Goals: Good    Frequency Min 1X/week     Co-evaluation               AM-PAC PT "6 Clicks" Mobility  Outcome Measure Help needed turning from your back to your side while in a flat bed without using bedrails?: A Little Help needed moving from lying on your back to sitting on the side of a flat bed without using bedrails?: A Little Help needed moving to and from a bed to a chair (including a wheelchair)?: A Little Help needed standing up from a chair using your arms (e.g., wheelchair or bedside chair)?: A Little Help needed to walk in hospital room?: A Little Help needed climbing 3-5 steps with a  railing? : A Little 6 Click Score: 18    End of Session   Activity Tolerance: Patient limited by fatigue Patient left: in chair;with call bell/phone within reach;with chair alarm set Nurse Communication: Mobility status PT Visit Diagnosis: Unsteadiness on feet (R26.81);Muscle weakness (generalized) (M62.81)    Time: 1324-4010 PT Time Calculation (min) (ACUTE ONLY): 30  min   Charges:   PT Evaluation $PT Eval Low Complexity: 1 Low PT Treatments $Therapeutic Activity: 8-22 mins PT General Charges $$ ACUTE PT VISIT: 1 Visit         Marye Round, PT DPT Acute Rehabilitation Services Secure Chat Preferred  Office [phone]   Julio Zappia Sheliah Plane 10/28/2022, 12:55 PM

## 2022-10-28 NOTE — H&P (Signed)
History and Physical    Michael Bender YQM:[id number] DOB: 03/17/1945 DOA: 10/27/2022  PCP: Lula Olszewski, MD   Patient coming from:  Transfer [facility]    Chief Complaint:  Chief Complaint  Patient presents with   Weakness    HPI:  Michael Bender is a 77 y.o. male with hx of COPD, hypertension, hyperlipidemia, CVA, PAD, history chronic pain related to cervical fusion, on chronic opiates, who is transferred from [facility] for AKI and electrolyte abnormalities.  He had initially presented to med [facility] due to 3-week history of generalized weakness and frequent ground-level falls, which was progressive.  Reports his mechanical falls, imbalance, falling backwards.  No vertigo.  Reports he has hit his head multiple times.  Not on anticoagulation.  No worsening of his chronic neck pain. prior to this was in normal state of health and no history of falls prior.  No preceding illness, fever, chills, chest pain, shortness of breath, cough/cold, no pain, nausea, vomiting, diarrhea.  No headaches, vision changes, speech changes, focal numbness or weakness.  He has chronic neuropathy in his legs which she states is unchanged.  He has a action tremor which son notes is gradually worsening with time.  Otherwise reports nocturia recently, but no known prostate issues previously.  Review of Systems:  ROS complete and negative except as marked above   Allergies  Allergen Reactions   Nubain [Nalbuphine Hcl]     Muscle contraction    Prior to Admission medications   Medication Sig Start Date End Date Taking? Authorizing Provider  acetaminophen (TYLENOL) 325 MG tablet Take 2 tablets (650 mg total) by mouth every 6 (six) hours as needed for mild pain (or Fever >/= 101). 07/21/19   Lonia Blood, MD  albuterol (VENTOLIN HFA) 108 (90 Base) MCG/ACT inhaler Inhale 2 puffs into the lungs every 6 (six) hours as needed for wheezing or shortness of breath. Patient not taking:  Reported on 10/13/2022 12/14/20   [place], Betty G, MD  amitriptyline (ELAVIL) 50 MG tablet Take 50 mg by mouth in the morning, at noon, and at bedtime.     [provider]  amLODipine (NORVASC) 5 MG tablet TAKE 1 TABLET BY MOUTH EVERYDAY AT BEDTIME 02/17/22   [place], Betty G, MD  amoxicillin-clavulanate (AUGMENTIN) 875-125 MG tablet Take 1 tablet by mouth 2 (two) times daily. 06/03/22   Lula Olszewski, MD  Cyanocobalamin (VITAMIN B-12 PO) Take by mouth.    [provider]  famotidine-calcium carbonate-magnesium hydroxide (PEPCID COMPLETE) 10-800-165 MG chewable tablet Chew 1 tablet by mouth daily as needed. Patient not taking: Reported on 10/13/2022 05/03/22   Lula Olszewski, MD  gabapentin (NEURONTIN) 100 MG capsule Take 1 capsule (100 mg total) by mouth 3 (three) times daily. Patient not taking: Reported on 10/13/2022 05/03/22   Lula Olszewski, MD  mupirocin ointment Idelle Jo) 2 %  04/01/19   [provider]  oxycodone (ROXICODONE) 30 MG immediate release tablet Take 1 tablet (30 mg total) by mouth 3 (three) times daily. 05/03/22   Lula Olszewski, MD  pantoprazole (PROTONIX) 40 MG tablet TAKE 1 TABLET BY MOUTH EVERY DAY BEFORE BREAKFAST 10/20/22   Lula Olszewski, MD  polyethylene glycol (MIRALAX / GLYCOLAX) 17 g packet Take 34 g by mouth daily.     [provider]  ramelteon (ROZEREM) 8 MG tablet Take 1 tablet (8 mg total) by mouth at bedtime. 04/05/22   Lula Olszewski, MD  rosuvastatin (CRESTOR) 40 MG tablet Take 1 tablet (40 mg total) by mouth daily. Replaces atorvastatin (stop atorvastatin if still taking) Patient not taking: Reported on 10/13/2022 03/03/22   Lula Olszewski, MD  SYMBICORT 160-4.5 MCG/ACT inhaler INHALE 2 PUFFS INTO THE LUNGS TWICE A DAY Patient not taking: Reported on 10/13/2022 04/14/21   [place], Betty G, MD    Past Medical History:  Diagnosis Date   Arthritis    Back   Blood transfusion    as a child   Chronic back pain    COPD (chronic  obstructive pulmonary disease) (HCC)    Disturbance of skin sensation 05/22/2018   Essential hypertension 03/05/2007   Qualifier: Diagnosis of   By: Tawanna Cooler MD, Eugenio Hoes      Gastric erosion    Gastritis and gastroduodenitis    Gastrointestinal hemorrhage with melena 11/20/2018   GIB (gastrointestinal bleeding) 07/18/2019   Headache(784.0)    last one 6 months ago   Hemorrhoids, internal    History of duodenal ulcer    History of lumbar fusion 03/03/2022   RE-OPERATIVE DIAGNOSIS:  lumbar stenosis synovial cyst lumbar spondylosis spondylolisthesis lumbar radiculoapthy L4/5   PROCEDURE:  Procedure(s): POSTERIOR LUMBAR FUSION 1 LEVEL with resection of synovial cyst     History of upper gastrointestinal bleeding 03/03/2022   Duodenal ulcer 2021 Dr. [name]   HLD (hyperlipidemia)    Hypertension    Loss of weight    Wt Readings from Last 10 Encounters:  04/05/22  146 lb (66.2 kg)  03/03/22  143 lb 9.6 oz (65.1 kg)  07/14/21  153 lb (69.4 kg)  12/14/20  147 lb 12.8 oz (67 kg)  11/13/19  154 lb (69.9 kg)  09/16/19  146 lb (66.2 kg)  08/06/19  146 lb (66.2 kg)  07/19/19  144 lb 13.5 oz (65.7 kg)  07/17/19  148 lb (67.1 kg)  07/09/19  147 lb 9.6 oz (67 kg)         Neck rigidity    post cervical fusion   Nocturia    PAIN, CHRONIC NEC 10/06/2006   Qualifier: Diagnosis of   By: Tawanna Cooler RN, Alvino Chapel       Pneumonia    Prostate disease    S/P cervical spinal fusion 03/03/2022   With persistent cervical pain and palpable screws Led to disability History of attempt to dig furrow in skull to fix it Last surgery 1992    Past Surgical History:  Procedure Laterality Date   BIOPSY  07/19/2019   Procedure: BIOPSY;  Surgeon: Shellia Cleverly, DO;  Location: [facility];  Service: Gastroenterology;;   CERVICAL FUSION  1992   C2/C 3  four surgeries   COLONOSCOPY     ESOPHAGOGASTRODUODENOSCOPY  07/20/2011   Procedure: ESOPHAGOGASTRODUODENOSCOPY (EGD);  Surgeon: [name], MD;  Location: [facility];  Service: Endoscopy;  Laterality: N/A;   ESOPHAGOGASTRODUODENOSCOPY (EGD) WITH PROPOFOL N/A 07/19/2019   Procedure: ESOPHAGOGASTRODUODENOSCOPY (EGD) WITH PROPOFOL;  Surgeon: Shellia Cleverly, DO;  Location: [facility];  Service: Gastroenterology;  Laterality: N/A;   SAVORY DILATION  07/20/2011   Procedure: SAVORY DILATION;  Surgeon: [name], MD;  Location: [facility];  Service: Endoscopy;  Laterality: N/A;   SPINAL FUSION  05/06/11     reports that he has been smoking cigarettes. He started smoking about 43 years ago. He has never used smokeless tobacco. He reports that he does not currently use alcohol after a past usage of about 2.0 standard drinks of alcohol  per week. He reports that he does not use drugs.  Family History  Problem Relation Age of Onset   Heart disease Mother    Heart attack Mother 77   Pancreatic cancer Father 52   Pancreatic cancer Brother    Anesthesia problems Neg Hx    Colon cancer Neg Hx    Liver cancer Neg Hx    Stomach cancer Neg Hx    Esophageal cancer Neg Hx    Rectal cancer Neg Hx      Physical Exam: Vitals:   10/27/22 2230 10/28/22 0000 10/28/22 0100 10/28/22 0210  BP: (!) 196/77 (!) 150/82 (!) 169/66 (!) 177/81  Pulse: 77 94  87  Resp: 16 19 16 20   Temp:  99.2 F (37.3 C)  99 F (37.2 C)  TempSrc:  Oral  Oral  SpO2: 96% 96% 96% 100%  Weight:    58.1 kg  Height:    5\' 11"  (1.803 m)    Gen: Awake, alert, NAD, chronically ill-appearing CV: Regular, normal S1, S2, no murmurs  HEENT: Head is atraumatic Resp: Normal WOB, there are few rales in the bases, diminished breath sounds at the bases Abd: Flat, normoactive, there is mild periumbilical and suprapubic tenderness.  No rebound, guarding, rigidity. MSK: Symmetric, no edema.  Extremities atraumatic Skin: There are scattered ecchymosis, scabs from abrasions Neuro: Alert and interactive, no nystagmus, CN II through XII intact, motor with 4 out of 5 strength in the left upper  extremity.  Otherwise 5 out of 5 throughout.  Sensation is intact and equal.  There is an action tremor bilaterally, however worse on the left.  There is akathisia, chorea like movements.  Psych: euthymic, appropriate    Data review:   Labs reviewed, notable for:  K2.1, mag 1.9 Creatinine 4.2 baseline 1 CK 2828 AST 126, ALT 49 Alk phos 153   Micro:  Results for orders placed or performed in visit on 09/12/19  SARS Coronavirus 2 (TAT 6-24 hrs)     Status: None   Collection Time: 09/12/19 12:00 AM  Result Value Ref Range Status   SARS Coronavirus 2 RESULT: NEGATIVE  Final    Comment: RESULT: NEGATIVESARS-CoV-2 INTERPRETATION:A NEGATIVE  test result means that SARS-CoV-2 RNA was not present in the specimen above the limit of detection of this test. This does not preclude a possible SARS-CoV-2 infection and should not be used as the  sole basis for patient management decisions. Negative results must be combined with clinical observations, patient history, and epidemiological information. Optimum specimen types and timing for peak viral levels during infections caused by SARS-CoV-2  have not been determined. Collection of multiple specimens or types of specimens may be necessary to detect virus. Improper specimen collection and handling, sequence variability under primers/probes, or organism present below the limit of detection may  lead to false negative results. Positive and negative predictive values of testing are highly dependent on prevalence. False negative test results are more likely when prevalence of disease is high.The expected result is NEGATIVE.Fact S heet for  Healthcare Providers: [employer]s.gl Sheet for Patients: https://poole-freeman.org/ Reference Range - Negative     Imaging reviewed:  DG Chest 2 View  Result Date: 10/27/2022 CLINICAL DATA:  Progressive weakness EXAM: CHEST - 2 VIEW COMPARISON:  07/17/2019 FINDINGS:  The heart size and mediastinal contours are within normal limits. Aortic atherosclerosis. Both lungs are clear. The visualized skeletal structures are unremarkable. IMPRESSION: No active cardiopulmonary disease. Electronically Signed   By: Adrian Prows.D.  On: 10/27/2022 22:53   CT ABDOMEN PELVIS WO CONTRAST  Result Date: 10/27/2022 CLINICAL DATA:  Abdominal pain EXAM: CT ABDOMEN AND PELVIS WITHOUT CONTRAST TECHNIQUE: Multidetector CT imaging of the abdomen and pelvis was performed following the standard protocol without IV contrast. RADIATION DOSE REDUCTION: This exam was performed according to the departmental dose-optimization program which includes automated exposure control, adjustment of the mA and/or kV according to patient size and/or use of iterative reconstruction technique. COMPARISON:  CT 07/17/2019 FINDINGS: Lower chest: Lung bases demonstrate no acute airspace disease. Mild coronary vascular calcification. Hepatobiliary: No calcified gallstone or biliary dilatation. Hepatic cyst in the left hepatic dome. Pancreas: Markedly atrophic. Scattered calcifications presumably due to chronic pancreatitis. No acute inflammation Spleen: Normal in size without focal abnormality. Adrenals/Urinary Tract: Adrenal glands are normal. Prominent renal collecting systems without obstructing stone. Distended urinary bladder. Renal cysts. No imaging follow-up is recommended Stomach/Bowel: The stomach is nonenlarged. There is no dilated small bowel. Extensive stool throughout the colon with hyperdense material. Postsurgical changes at the rectum Vascular/Lymphatic: Moderate aortic atherosclerosis. No aneurysm. No suspicious lymph nodes Reproductive: Prostate unremarkable Other: Negative for pelvic effusion or free air. Musculoskeletal: No acute or suspicious osseous abnormality. Posterior spinal fusion hardware at L4-L5. IMPRESSION: 1. Negative for bowel obstruction or acute inflammatory process. 2. Extensive stool  throughout the colon with hyperdense material, question constipation. 3. Distended urinary bladder with prominent renal collecting systems. 4. Atrophic pancreas with scattered calcifications presumably due to chronic pancreatitis. 5. Aortic atherosclerosis. Aortic Atherosclerosis (ICD10-I70.0). Electronically Signed   By: Jasmine Pang M.D.   On: 10/27/2022 22:51    EKG: Sinus rhythm, prolonged QTc at 523  ED Course:  Treated with potassium chloride 40 p.o., 10 mg IV, normal saline at 125 an hour.  Transferred to [facility]   Assessment/Plan:  77 y.o. male with hx COPD, hypertension, hyperlipidemia, CVA, PAD, history chronic pain related to cervical fusion, on chronic opiates, who is transferred from [facility] for AKI and electrolyte abnormalities.   Acute kidney injury stage III Baseline creatinine approximately 0.9.  Elevated at 4.2 at outside hospital.  CT with distended bladder, and renal collection systems.  Suspect postrenal with bladder outlet obstruction from BPH. Also on multiple anticholinergic meds with high dose of amitriptyline, and cyclobenzaprine. Additional component of intrinsic injury from rhabdomyolysis.  -Foley placed at outside hospital, void trial prior to discharge or if no significant renal recovery would send with Foley and have outpatient urology follow-up.  -Continue maintenance IV fluid in setting of rhabdo -Start on tamsulosin 0.4 mg p.o. nightly  Generalized weakness  Multiple ground level falls  Weakness likely due to symptomatic electrolyte abnormalities, with additional risk for fall with his ? Movement disorder as noted below.  -With age and history of multiple falls with head injuries, obtain CT head - PT evaluation  - Management of electrolytes / movement disorder per below   Rhabdomyolysis, mixed traumatic and possibly nontraumatic with hypokalemia CK 2828, AST elevation suspect related to muscle breakdown as well.  History multiple recent  falls in past weeks.  -Repeat CK -LR at 150 cc an hour  Hypokalemia, severe, symptomatic Hypomagnesemia -Repeat potassium, continue repletion's -Replete mag, check Phos  Prolonged Qtc Initial EKG with QTc of 523, possibly related to hypokalemia -Cautious with med Rx, reduced dose of amitriptyline -K repletion's, repeat EKG once K improved  Question of movement disorder  Action tremor Choreiform movement / akathisia  Question of ET, possible component of cerebellar tremor with increased  amplitude near target. Also with abnormal choreiform movements and akasthesia on exam.  - Referral to neurology   Constipation -MiraLAX twice daily, senna daily  Abnormal liver function test AST likely related to rhabdomyolysis.  Isolated elevated alk phos of unknown cause -Trend LFT  Chronic medical problems:  COPD, continued but rare smoking: Further counseling on smoking cessation.  Currently not taking any inhalers. Hypertension: No recent fill of antihypertensives Hyperlipidemia: Per recent Pharm note not taking rosuvastatin. ? Mood disorder: Decrease dose of amitriptyline to 25 mg 3 times daily due to his prolonged Qtc Chronic pain: Continue home oxycodone 10 mg p.o. 3 times daily Chronic anemia: Check B12 in setting of disequilibrium   Body mass index is 17.86 kg/m.    DVT prophylaxis:  SCDs Code Status:  Full Code ; note he was initially undecided on CODE STATUS.  Needs more time to think about this, wants to remain full code for now.  Diet:  Diet Orders (From admission, onward)     Start     Ordered   10/28/22 0322  Diet regular Room service appropriate? Yes; Fluid consistency: Thin  Diet effective now       Question Answer Comment  Room service appropriate? Yes   Fluid consistency: Thin      10/28/22 0324           Family Communication: Discussed with the son at bedside Consults: None Admission status:   Inpatient, Telemetry bed  Severity of Illness: The  appropriate patient status for this patient is INPATIENT. Inpatient status is judged to be reasonable and necessary in order to provide the required intensity of service to ensure the patient's safety. The patient's presenting symptoms, physical exam findings, and initial radiographic and laboratory data in the context of their chronic comorbidities is felt to place them at high risk for further clinical deterioration. Furthermore, it is not anticipated that the patient will be medically stable for discharge from the hospital within 2 midnights of admission.   * I certify that at the point of admission it is my clinical judgment that the patient will require inpatient hospital care spanning beyond 2 midnights from the point of admission due to high intensity of service, high risk for further deterioration and high frequency of surveillance required.*   Dolly Rias, MD [facility]  How to contact the [facility] Attending or Consulting provider 7A - 7P or covering provider during after hours 7P -7A, for this patient.  Check the care team in [facility] and look for a) attending/consulting [facility] provider listed and b) the [facility] team listed Log into [email or web address] and use [facility]'s universal password to access. If you do not have the password, please contact the hospital operator. Locate the [facility] provider you are looking for under [facility] and page to a number that you can be directly reached. If you still have difficulty reaching the provider, please page the [facility] (Director on Call) for the Hospitalists listed on amion for assistance.  10/28/2022, 5:33 AM

## 2022-10-28 NOTE — Progress Notes (Signed)
Down to CT

## 2022-10-28 NOTE — ED Notes (Signed)
[facility] called for transport @ 00:43  No Current ETA...  Nurse will call for report to unit / floor

## 2022-10-28 NOTE — TOC Initial Note (Signed)
Transition of Care [facility]) - Initial/Assessment Note    Patient Details  Name: Michael Bender MRN: [id number] Date of Birth: July 03, 1945  [facility] CM/SW Contact:    Leone Haven, RN Phone Number: 10/28/2022, 4:09 PM  Clinical Narrative:                 From home alone, has PCP and insurance on file, states has no HH services in place at this time or DME at home.  States family one of his two son's will transport him  home at dc and they are his support system, states [facility] [place].  Pta self ambulatory.   NCM was notified that he refused SNF.  NCM spoke with patient about [facility] services, he states he does not want HH services.  NCM informed him that we want the safest dc for him as possible, he states he will go home and see what happens.  He is very adamant about not wanting SNF or HH.   Expected Discharge Plan: Skilled Nursing Facility Barriers to Discharge: Continued Medical Work up   Patient Goals and CMS Choice Patient states their goals for this hospitalization and ongoing recovery are:: return home   Choice offered to / list presented to : NA      Expected Discharge Plan and Services In-house Referral: NA Discharge Planning Services: CM Consult Post Acute Care Choice: Home Health Living arrangements for the past 2 months: Single Family Home                 DME Arranged: N/A DME Agency: NA       HH Arranged: NA          Prior Living Arrangements/Services Living arrangements for the past 2 months: Single Family Home Lives with:: Self Patient language and need for interpreter reviewed:: Yes Do you feel safe going back to the place where you live?: Yes      Need for Family Participation in Patient Care: Yes (Comment) Care giver support system in place?: Yes (comment)   Criminal Activity/Legal Involvement Pertinent to Current Situation/Hospitalization: No - Comment as needed  Activities of Daily Living       Permission Sought/Granted Permission sought to share information with : Case Manager Permission granted to share information with : Yes, Verbal Permission Granted              Emotional Assessment   Attitude/Demeanor/Rapport: Engaged Affect (typically observed): Blunt Orientation: : Oriented to Self, Oriented to Place, Oriented to  Time, Oriented to Situation Alcohol / Substance Use: Not Applicable Psych Involvement: No (comment)  Admission diagnosis:  Hypokalemia [E87.6] Renal insufficiency [N28.9] AKI (acute kidney injury) (HCC) [N17.9] Acute urinary retention [R33.8] Constipation, unspecified constipation type [K59.00] Patient Active Problem List   Diagnosis Date Noted   AKI (acute kidney injury) (HCC) 10/28/2022   Cachexia (HCC) 05/03/2022   Underweight on examination 05/03/2022   Chronic low back pain with bilateral sciatica 05/03/2022   Gynecomastia, male 04/05/2022   History of fusion of cervical spine 03/03/2022   History of lumbar fusion 03/03/2022   High risk medication use 03/03/2022   Intractable pain 03/03/2022   Chronic narcotic dependence (HCC) 12/14/2020   Atherosclerosis of aorta (HCC) 12/14/2020   Duodenal stenosis    HLD (hyperlipidemia) 07/18/2019   COPD (chronic obstructive pulmonary disease) (HCC) 07/18/2019   Iron deficiency anemia    History of CVA (cerebrovascular accident) 02/18/2019   Senile ecchymosis 01/21/2019   PAD (  peripheral artery disease) (HCC) 12/11/2018   Insomnia 11/20/2018   Right sided temporal headache 05/22/2018   Esophageal dysphagia 07/15/2011   Bilateral leg pain 05/03/2010   DISC DISEASE, LUMBAR 01/01/2009   CONSTIPATION, CHRONIC 10/07/2008   GERD 04/04/2007   Pulmonary emphysema (HCC) 03/26/2007   PCP:  Lula Olszewski, MD Pharmacy:   [phone] [place] [place] - [address] [place] [address] Phone: [phone] Fax:  [phone]  [facility] [address], [place] - [address] [place] [address] Phone: [phone] Fax: [phone]     Social Determinants of Health (SDOH) Social History: SDOH Screenings   Food Insecurity: No Food Insecurity (10/13/2022)  Housing: Low Risk  (10/13/2022)  Transportation Needs: No Transportation Needs (10/13/2022)  Utilities: Not At Risk (10/13/2022)  Alcohol Screen: Low Risk  (07/14/2021)  Depression (PHQ2-9): Low Risk  (10/13/2022)  Financial Resource Strain: Low Risk  (10/13/2022)  Physical Activity: Insufficiently Active (10/13/2022)  Social Connections: Socially Isolated (10/13/2022)  Stress: No Stress Concern Present (10/13/2022)  Tobacco Use: High Risk (10/27/2022)  Health Literacy: Inadequate Health Literacy (10/13/2022)   SDOH Interventions:     Readmission Risk Interventions     No data to display

## 2022-10-28 NOTE — Progress Notes (Signed)
Return from CT

## 2022-10-29 DIAGNOSIS — N179 Acute kidney failure, unspecified: Secondary | ICD-10-CM | POA: Diagnosis not present

## 2022-10-29 LAB — BASIC METABOLIC PANEL
Anion gap: 12 (ref 5–15)
BUN: 24 mg/dL — ABNORMAL HIGH (ref 8–23)
CO2: 24 mmol/L (ref 22–32)
Calcium: 7.8 mg/dL — ABNORMAL LOW (ref 8.9–10.3)
Chloride: 106 mmol/L (ref 98–111)
Creatinine, Ser: 3.19 mg/dL — ABNORMAL HIGH (ref 0.61–1.24)
GFR, Estimated: 19 mL/min — ABNORMAL LOW (ref >60)
Glucose, Bld: 147 mg/dL — ABNORMAL HIGH (ref 70–99)
Potassium: 3.2 mmol/L — ABNORMAL LOW (ref 3.5–5.1)
Sodium: 142 mmol/L (ref 135–145)

## 2022-10-29 LAB — CK: Total CK: 992 U/L — ABNORMAL HIGH (ref 49–397)

## 2022-10-29 MED ORDER — POTASSIUM CHLORIDE CRYS ER 20 MEQ PO TBCR
40.0000 meq | EXTENDED_RELEASE_TABLET | Freq: Once | ORAL | Status: AC
  Administered 2022-10-29: 20 meq via ORAL
  Filled 2022-10-29: qty 2

## 2022-10-29 MED ORDER — CYCLOBENZAPRINE HCL 10 MG PO TABS: 10.0000 mg | ORAL_TABLET | Freq: Three times a day (TID) | ORAL | Status: DC | PRN

## 2022-10-29 MED ORDER — POTASSIUM CHLORIDE CRYS ER 20 MEQ PO TBCR
20.0000 meq | EXTENDED_RELEASE_TABLET | Freq: Once | ORAL | Status: AC
  Administered 2022-10-29: 20 meq via ORAL
  Filled 2022-10-29: qty 1

## 2022-10-29 NOTE — Progress Notes (Signed)
PROGRESS NOTE    Michael Bender  ZOX:[id number] DOB: [birth year] DOA: 10/27/2022 PCP: Lula Olszewski, MD   Brief Narrative: Michael Bender is a 77 y.o. male with a history of COPD, hypertension, hyperlipidemia, CVA, PAD, chronic pain, constipation.  Patient presented secondary to weakness and multiple falls and noted to have evidence of AKI.  Patient noted to have urinary retention secondary to bladder outlet obstruction and Foley catheter was placed.  Patient also noted to have evidence of rhabdomyolysis.  IV fluids started..   Assessment and Plan:  AKI Likely related to outlet obstruction. Hopefully, not related to rhabdomyolysis diagnosis. Baseline creatinine of about 1.0-1.1. Creatinine of 4.21 on admission. IV fluids started for rhabdomyolysis and foley placed for bladder outlet obstruction. Creatinine down to 3.19. UOP of 3,875 mL over the last 24 hours. -Continue IV fluids -Continue strict in/out -Daily BMP  Acute urinary retention Bladder outlet obstruction Presumed secondary to BPH. Foley placed at outside hospital and patient started on Flomax. Urinalysis significant for microscopic hematuria and mild protein. -Remove foley; voiding trial  Traumatic rhabdomyolysis CK of 2,828 on admission. IV fluids started with improvement. -PT/OT   Hypokalemia -Supplementation  Prolonged QTc -Continue telemetry  Action tremor Noted this admission. Recommendation for neurology follow-up.  Constipation -Continue MiraLAX and Senna  Elevated AST/ALT Secondary to rhabdomyolysis. Improved.  COPD Noted. Stable. Not on medication therapy.  Primary hypertension -Continue amlodipine  Hyperlipidemia Patient is on Crestor as an outpatient which is held on admission.  Chronic pain -Continue oxycodone  Normocytic anemia Normal vitamin B12. Anemia may be acute vs chronic. Last hemoglobin of 13.5 g/dL from 08/1189. Hemoglobin of 10.5 g/dL on admission. History of iron  deficiency anemia in 2021. -Anemia panel    DVT prophylaxis: Place and maintain sequential compression device Start: 10/28/22 0336 Code Status:   Code Status: Full Code Family Communication: Son at bedside Disposition Plan: Discharge to SNF if patient agreeable vs home likely in 2-3 days pending continued improvement of renal function.   Consultants:  None  Procedures:  None  Antimicrobials: None    Subjective: Patient reports no specific concerns today. Hoping to go home.  Objective: BP (!) 189/81 (BP Location: Left Arm)   Pulse 75   Temp 98 F (36.7 C) (Oral)   Resp 18   Ht 5\' 11"  (1.803 m)   Wt 64.8 kg   SpO2 100%   BMI 19.92 kg/m   Examination:  General exam: Appears calm and comfortable Respiratory system: Clear to auscultation. Respiratory effort normal. Cardiovascular system: S1 & S2 heard, RRR. Gastrointestinal system: Abdomen is nondistended, soft and nontender. Normal bowel sounds heard. Central nervous system: Alert and oriented. Psychiatry: Judgement and insight appear normal. Mood & affect appropriate.    Data Reviewed: I have personally reviewed following labs and imaging studies  CBC Lab Results  Component Value Date   WBC 5.4 10/27/2022   RBC 3.37 (L) 10/27/2022   HGB 10.5 (L) 10/27/2022   HCT 30.7 (L) 10/27/2022   MCV 91.1 10/27/2022   MCH 31.2 10/27/2022   PLT 133 (L) 10/27/2022   MCHC 34.2 10/27/2022   RDW 13.2 10/27/2022   LYMPHSABS 2.2 05/03/2022   MONOABS 0.7 05/03/2022   EOSABS 0.3 05/03/2022   BASOSABS 0.0 05/03/2022     Last metabolic panel Lab Results  Component Value Date   NA 142 10/29/2022   K 3.2 (L) 10/29/2022   CL 106 10/29/2022   CO2 24 10/29/2022   BUN 24 (H) 10/29/2022  CREATININE 3.19 (H) 10/29/2022   GLUCOSE 147 (H) 10/29/2022   GFRNONAA 19 (L) 10/29/2022   GFRAA >60 07/20/2019   CALCIUM 7.8 (L) 10/29/2022   PHOS 3.7 10/28/2022   PROT 5.5 (L) 10/28/2022   ALBUMIN 2.4 (L) 10/28/2022   BILITOT 0.5  10/28/2022   ALKPHOS 134 (H) 10/28/2022   AST 95 (H) 10/28/2022   ALT 39 10/28/2022   ANIONGAP 12 10/29/2022    GFR: Estimated Creatinine Clearance: 17.8 mL/min (A) (by C-G formula based on SCr of 3.19 mg/dL (H)).  No results found for this or any previous visit (from the past 240 hour(s)).    Radiology Studies: CT HEAD WO CONTRAST ( )  Result Date: 10/28/2022 CLINICAL DATA:  77 year old male with falls, head injury. EXAM: CT HEAD WITHOUT CONTRAST TECHNIQUE: Contiguous axial images were obtained from the base of the skull through the vertex without intravenous contrast. RADIATION DOSE REDUCTION: This exam was performed according to the departmental dose-optimization program which includes automated exposure control, adjustment of the mA and/or kV according to patient size and/or use of iterative reconstruction technique. COMPARISON:  Brain MRI 02/16/2019. FINDINGS: Brain: Cerebral volume appears stable since 2021. Chronic lacunar infarct of the left thalamus was present at that time, but a larger chronic appearing lacunar infarct of the left corona radiata and lentiform is new since that time. Patchy white matter hypodensity elsewhere not significantly changed. Posterior fossa remains within normal limits. No midline shift, ventriculomegaly, mass effect, evidence of mass lesion, intracranial hemorrhage or evidence of cortically based acute infarction. Vascular: Calcified atherosclerosis at the skull base. No suspicious intracranial vascular hyperdensity. Skull: No acute osseous abnormality identified. Sinuses/Orbits: Chronic paranasal sinus disease, especially bulky sphenoid mucoperiosteal thickening. But other paranasal sinuses, tympanic cavities and mastoids remain well aerated. Other: No acute orbit or scalp soft tissue injury identified. Postoperative changes to the globes. IMPRESSION: 1. No acute intracranial abnormality or acute traumatic injury identified. 2. Progressed chronic small vessel  disease since 2021 in the left corona radiata, left basal ganglia. Electronically Signed   By: Odessa Fleming M.D.   On: 10/28/2022 05:45   DG Chest 2 View  Result Date: 10/27/2022 CLINICAL DATA:  Progressive weakness EXAM: CHEST - 2 VIEW COMPARISON:  07/17/2019 FINDINGS: The heart size and mediastinal contours are within normal limits. Aortic atherosclerosis. Both lungs are clear. The visualized skeletal structures are unremarkable. IMPRESSION: No active cardiopulmonary disease. Electronically Signed   By: Jasmine Pang M.D.   On: 10/27/2022 22:53   CT ABDOMEN PELVIS WO CONTRAST  Result Date: 10/27/2022 CLINICAL DATA:  Abdominal pain EXAM: CT ABDOMEN AND PELVIS WITHOUT CONTRAST TECHNIQUE: Multidetector CT imaging of the abdomen and pelvis was performed following the standard protocol without IV contrast. RADIATION DOSE REDUCTION: This exam was performed according to the departmental dose-optimization program which includes automated exposure control, adjustment of the mA and/or kV according to patient size and/or use of iterative reconstruction technique. COMPARISON:  CT 07/17/2019 FINDINGS: Lower chest: Lung bases demonstrate no acute airspace disease. Mild coronary vascular calcification. Hepatobiliary: No calcified gallstone or biliary dilatation. Hepatic cyst in the left hepatic dome. Pancreas: Markedly atrophic. Scattered calcifications presumably due to chronic pancreatitis. No acute inflammation Spleen: Normal in size without focal abnormality. Adrenals/Urinary Tract: Adrenal glands are normal. Prominent renal collecting systems without obstructing stone. Distended urinary bladder. Renal cysts. No imaging follow-up is recommended Stomach/Bowel: The stomach is nonenlarged. There is no dilated small bowel. Extensive stool throughout the colon with hyperdense material. Postsurgical changes at the rectum Vascular/Lymphatic:  Moderate aortic atherosclerosis. No aneurysm. No suspicious lymph nodes Reproductive:  Prostate unremarkable Other: Negative for pelvic effusion or free air. Musculoskeletal: No acute or suspicious osseous abnormality. Posterior spinal fusion hardware at L4-L5. IMPRESSION: 1. Negative for bowel obstruction or acute inflammatory process. 2. Extensive stool throughout the colon with hyperdense material, question constipation. 3. Distended urinary bladder with prominent renal collecting systems. 4. Atrophic pancreas with scattered calcifications presumably due to chronic pancreatitis. 5. Aortic atherosclerosis. Aortic Atherosclerosis (ICD10-I70.0). Electronically Signed   By: Jasmine Pang M.D.   On: 10/27/2022 22:51      LOS: 1 day    Jacquelin Hawking, MD [facility] 10/29/2022, 2:18 PM   If 7PM-7AM, please contact night-coverage [email or web address]

## 2022-10-29 NOTE — Plan of Care (Signed)
Pt vital signs in normal range

## 2022-10-29 NOTE — Progress Notes (Signed)
Mobility Specialist Progress Note    10/29/22 1045  Mobility  Activity Ambulated with assistance in hallway  Level of Assistance Minimal assist, patient does 75% or more  Assistive Device Other (Comment) (HHA)  Distance Ambulated (ft) 300 ft  Activity Response Tolerated well  Mobility Referral Yes  $Mobility charge 1 Mobility  Mobility Specialist Start Time (ACUTE ONLY) 1030  Mobility Specialist Stop Time (ACUTE ONLY) 1044  Mobility Specialist Time Calculation (min) (ACUTE ONLY) 14 min   Pre-Mobility: 73 HR During Mobility: 92 HR Post-Mobility: 80 HR  Pt received in bed and agreeable. Pt minA+1 for bed mobility and contact guard for ambulation. Pt denied use of AD. No complaints. Returned to chair with call bell in reach. RN notified.   [place] Mobility Specialist  Please Neurosurgeon or Rehab Office at [phone]

## 2022-10-29 NOTE — Plan of Care (Signed)

## 2022-10-29 NOTE — Hospital Course (Signed)
Michael Bender is a 77 y.o. male with a history of COPD, hypertension, hyperlipidemia, CVA, PAD, chronic pain, constipation.  Patient presented secondary to weakness and multiple falls and noted to have evidence of AKI.  Patient noted to have urinary retention secondary to bladder outlet obstruction and Foley catheter was placed.  Patient also noted to have evidence of rhabdomyolysis.  IV fluids started.Marland Kitchen

## 2022-10-30 DIAGNOSIS — N179 Acute kidney failure, unspecified: Secondary | ICD-10-CM | POA: Diagnosis not present

## 2022-10-30 LAB — FERRITIN: Ferritin: 224 ng/mL (ref 24–336)

## 2022-10-30 LAB — BASIC METABOLIC PANEL
Anion gap: 15 (ref 5–15)
BUN: 17 mg/dL (ref 8–23)
CO2: 27 mmol/L (ref 22–32)
Calcium: 8.2 mg/dL — ABNORMAL LOW (ref 8.9–10.3)
Chloride: 101 mmol/L (ref 98–111)
Creatinine, Ser: 2.28 mg/dL — ABNORMAL HIGH (ref 0.61–1.24)
GFR, Estimated: 29 mL/min — ABNORMAL LOW (ref >60)
Glucose, Bld: 183 mg/dL — ABNORMAL HIGH (ref 70–99)
Potassium: 3.6 mmol/L (ref 3.5–5.1)
Sodium: 143 mmol/L (ref 135–145)

## 2022-10-30 LAB — RETICULOCYTES
Immature Retic Fract: 1.8 % — ABNORMAL LOW (ref 2.3–15.9)
RBC.: 3.41 MIL/uL — ABNORMAL LOW (ref 4.22–5.81)
Retic Count, Absolute: 19.8 10*3/uL (ref 19.0–186.0)
Retic Ct Pct: 0.6 % (ref 0.4–3.1)

## 2022-10-30 LAB — CK: Total CK: 802 U/L — ABNORMAL HIGH (ref 49–397)

## 2022-10-30 LAB — IRON AND TIBC
Iron: 46 ug/dL (ref 45–182)
Saturation Ratios: 21 % (ref 17.9–39.5)
TIBC: 223 ug/dL — ABNORMAL LOW (ref 250–450)
UIBC: 177 ug/dL

## 2022-10-30 LAB — FOLATE: Folate: 13.1 ng/mL (ref >5.9)

## 2022-10-30 NOTE — Plan of Care (Signed)
  Problem: Activity: Goal: Risk for activity intolerance will decrease Outcome: Progressing   Problem: Coping: Goal: Level of anxiety will decrease Outcome: Progressing   

## 2022-10-30 NOTE — Progress Notes (Signed)
Mobility Specialist Progress Note    10/30/22 1022  Mobility  Activity Ambulated with assistance in hallway  Level of Assistance Contact guard assist, steadying assist  Assistive Device Other (Comment) (HHA)  Distance Ambulated (ft) 300 ft  Activity Response Tolerated well  Mobility Referral Yes  $Mobility charge 1 Mobility  Mobility Specialist Start Time (ACUTE ONLY) 1013  Mobility Specialist Stop Time (ACUTE ONLY) 1024  Mobility Specialist Time Calculation (min) (ACUTE ONLY) 11 min   Pre-Mobility: 79 HR Post-Mobility: 90 HR  Pt received in bed and agreeable. No complaints. Pt kicked IV pole x2 but had no LOB. Returned to bed with call bell in reach.   [place] Mobility Specialist  Please Neurosurgeon or Rehab Office at [phone]

## 2022-10-30 NOTE — Progress Notes (Signed)
PROGRESS NOTE    DEVREN RYTHER  Bender:[id number] DOB: 1945/07/30 DOA: 10/27/2022 PCP: Lula Olszewski, MD   Brief Narrative: Michael Bender is a 77 y.o. male with a history of COPD, hypertension, hyperlipidemia, CVA, PAD, chronic pain, constipation.  Patient presented secondary to weakness and multiple falls and noted to have evidence of AKI.  Patient noted to have urinary retention secondary to bladder outlet obstruction and Foley catheter was placed.  Patient also noted to have evidence of rhabdomyolysis.  IV fluids started..   Assessment and Plan:  AKI Likely related to outlet obstruction. Hopefully, not related to rhabdomyolysis diagnosis. Baseline creatinine of about 1.0-1.1. Creatinine of 4.21 on admission. IV fluids started for rhabdomyolysis and foley placed for bladder outlet obstruction. Creatinine down to 2.28. UOP of 4,750 mL over the last 24 hours. -Continue IV fluids -Continue strict in/out -Daily BMP  Acute urinary retention Bladder outlet obstruction Presumed secondary to BPH. Foley placed at outside hospital and patient started on Flomax. Urinalysis significant for microscopic hematuria and mild protein. Foley catheter removed on 9/28 and patient has been voiding without issue.  Traumatic rhabdomyolysis CK of 2,828 on admission. IV fluids started with improvement. PT/OT recommending SNF.  Hypokalemia Resolved with supplementation.  Prolonged QTc -Continue telemetry  Action tremor Noted this admission. Recommendation for neurology follow-up.  Constipation -Continue MiraLAX and Senna  Elevated AST/ALT Secondary to rhabdomyolysis. Improved.  COPD Noted. Stable. Not on medication therapy.  Primary hypertension -Continue amlodipine  Hyperlipidemia Patient is on Crestor as an outpatient which is held on admission.  Chronic pain -Continue oxycodone  Normocytic anemia Normal vitamin B12. Anemia may be acute vs chronic. Last hemoglobin of 13.5 g/dL  from 08/1189. Hemoglobin of 10.5 g/dL on admission. History of iron deficiency anemia in 2021. Anemia panel suggests no iron deficiency.    DVT prophylaxis: Place and maintain sequential compression device Start: 10/28/22 0336 Code Status:   Code Status: Full Code Family Communication: None at bedside Disposition Plan: Discharge to SNF if patient agreeable vs home likely in 1 day pending continued improvement of renal function.   Consultants:  None  Procedures:  None  Antimicrobials: None    Subjective: No issues this morning. Feels well.  Objective: BP (!) 174/89 (BP Location: Left Arm)   Pulse 85   Temp 98.3 F (36.8 C) (Oral)   Resp 20   Ht 5\' 11"  (1.803 m)   Wt 63.8 kg   SpO2 96%   BMI 19.61 kg/m   Examination:  General exam: Appears calm and comfortable Respiratory system: Clear to auscultation. Respiratory effort normal. Cardiovascular system: S1 & S2 heard, RRR. Gastrointestinal system: Abdomen is nondistended, soft and nontender. Normal bowel sounds heard. Central nervous system: Alert and oriented. No focal neurological deficits. Psychiatry: Judgement and insight appear normal. Mood & affect appropriate.    Data Reviewed: I have personally reviewed following labs and imaging studies  CBC Lab Results  Component Value Date   WBC 5.4 10/27/2022   RBC 3.41 (L) 10/30/2022   HGB 10.5 (L) 10/27/2022   HCT 30.7 (L) 10/27/2022   MCV 91.1 10/27/2022   MCH 31.2 10/27/2022   PLT 133 (L) 10/27/2022   MCHC 34.2 10/27/2022   RDW 13.2 10/27/2022   LYMPHSABS 2.2 05/03/2022   MONOABS 0.7 05/03/2022   EOSABS 0.3 05/03/2022   BASOSABS 0.0 05/03/2022     Last metabolic panel Lab Results  Component Value Date   NA 143 10/30/2022   K 3.6 10/30/2022  CL 101 10/30/2022   CO2 27 10/30/2022   BUN 17 10/30/2022   CREATININE 2.28 (H) 10/30/2022   GLUCOSE 183 (H) 10/30/2022   GFRNONAA 29 (L) 10/30/2022   GFRAA >60 07/20/2019   CALCIUM 8.2 (L) 10/30/2022    PHOS 3.7 10/28/2022   PROT 5.5 (L) 10/28/2022   ALBUMIN 2.4 (L) 10/28/2022   BILITOT 0.5 10/28/2022   ALKPHOS 134 (H) 10/28/2022   AST 95 (H) 10/28/2022   ALT 39 10/28/2022   ANIONGAP 15 10/30/2022    GFR: Estimated Creatinine Clearance: 24.5 mL/min (A) (by C-G formula based on SCr of 2.28 mg/dL (H)).  No results found for this or any previous visit (from the past 240 hour(s)).    Radiology Studies: No results found.    LOS: 2 days    Jacquelin Hawking, MD [facility] 10/30/2022, 11:07 AM   If 7PM-7AM, please contact night-coverage [email or web address]

## 2022-10-30 NOTE — Plan of Care (Signed)

## 2022-10-31 DIAGNOSIS — N179 Acute kidney failure, unspecified: Secondary | ICD-10-CM | POA: Diagnosis not present

## 2022-10-31 LAB — COMPREHENSIVE METABOLIC PANEL
ALT: 37 U/L (ref 0–44)
AST: 114 U/L — ABNORMAL HIGH (ref 15–41)
Albumin: 2.5 g/dL — ABNORMAL LOW (ref 3.5–5.0)
Alkaline Phosphatase: 130 U/L — ABNORMAL HIGH (ref 38–126)
Anion gap: 8 (ref 5–15)
BUN: 13 mg/dL (ref 8–23)
CO2: 28 mmol/L (ref 22–32)
Calcium: 8.1 mg/dL — ABNORMAL LOW (ref 8.9–10.3)
Chloride: 104 mmol/L (ref 98–111)
Creatinine, Ser: 2.13 mg/dL — ABNORMAL HIGH (ref 0.61–1.24)
GFR, Estimated: 31 mL/min — ABNORMAL LOW (ref >60)
Glucose, Bld: 124 mg/dL — ABNORMAL HIGH (ref 70–99)
Potassium: 3.6 mmol/L (ref 3.5–5.1)
Sodium: 140 mmol/L (ref 135–145)
Total Bilirubin: 0.8 mg/dL (ref 0.3–1.2)
Total Protein: 5.8 g/dL — ABNORMAL LOW (ref 6.5–8.1)

## 2022-10-31 LAB — CBC
HCT: 32.5 % — ABNORMAL LOW (ref 39.0–52.0)
Hemoglobin: 11.3 g/dL — ABNORMAL LOW (ref 13.0–17.0)
MCH: 32 pg (ref 26.0–34.0)
MCHC: 34.8 g/dL (ref 30.0–36.0)
MCV: 92.1 fL (ref 80.0–100.0)
Platelets: 144 10*3/uL — ABNORMAL LOW (ref 150–400)
RBC: 3.53 MIL/uL — ABNORMAL LOW (ref 4.22–5.81)
RDW: 13.1 % (ref 11.5–15.5)
WBC: 4.5 10*3/uL (ref 4.0–10.5)
nRBC: 0 % (ref 0.0–0.2)

## 2022-10-31 LAB — CK: Total CK: 818 U/L — ABNORMAL HIGH (ref 49–397)

## 2022-10-31 NOTE — Progress Notes (Signed)
Physical Therapy Treatment Patient Details Name: Michael Bender MRN: [id number] DOB: 1945/11/29 Today's Date: 10/31/2022   History of Present Illness 77 yo male presents to [facility] on 9/26 with weakness, AKI, acute urinary retention. [facility] 9/27 negative for acute findings but does show "a larger  chronic appearing lacunar infarct of the left corona radiata and lentiform" new since previous CT in 2021.   + rhabdomyolysis. PMH includes COPD, hypertension, history of GI bleed, anemia, hyperlipidemia, CVA 2021, PAD, chronic pain, cervical fusion.    PT Comments  Pt received in supine, agreeable to therapy session and with good participation and tolerance for gait and stair training with single point cane. Pt steadier using cane than no AD, but needs intermittent reminders for safe use and pt with decreased insight into his deficits. Pt needing CGA to minA for stair ascent/descent with BUE support (countertop/cane) and mostly CGA for gait with cane support. Pt IV on LUE appears infiltrated/is leaking, RN notified and LUE elevated on pillow at end of session. Pt continues to benefit from PT services to progress toward functional mobility goals.     If plan is discharge home, recommend the following: A little help with walking and/or transfers;A little help with bathing/dressing/bathroom;Help with stairs or ramp for entrance;Assistance with cooking/housework   Can travel by private vehicle     Yes  Equipment Recommendations  None recommended by PT (recommend cane, pt states he has a walking stick at home he can use)    Recommendations for Other Services       Precautions / Restrictions Precautions Precautions: Fall Precaution Comments: many recent falls Restrictions Weight Bearing Restrictions: No     Mobility  Bed Mobility Overal bed mobility: Needs Assistance Bed Mobility: Supine to Sit, Sit to Supine     Supine to sit: Supervision, HOB elevated, Used rails Sit to supine: Supervision, HOB  elevated   General bed mobility comments: increased time, use of bedrails, cues for line awareness    Transfers Overall transfer level: Needs assistance Equipment used: None, Straight cane Transfers: Sit to/from Stand Sit to Stand: Contact guard assist           General transfer comment: to/from EOB    Ambulation/Gait Ambulation/Gait assistance: Contact guard assist Gait Distance (Feet): 200 Feet Assistive device: Straight cane Gait Pattern/deviations: Step-through pattern, Decreased stride length, Narrow base of support, Leaning posteriorly, Ataxic Gait velocity: decr     General Gait Details: Heavy heel strike. Pt with posterior bias especially with fatigue. Mild ataxia/inconsistent foot placement and appears to be walking more on outside of his feet (slightly supinating) with each foot. Intermittent use of cane for stability. PTA encouraged him to use cane/walking stick at home as he is consistently refusing to use RW.   Stairs Stairs: Yes Stairs assistance: Min assist, Contact guard assist Stair Management: Forwards, Step to pattern, Backwards, With cane, One rail Left Number of Stairs: 10 General stair comments: cues for safety, use of cane, activity pacing; HR to 115 bpm with exertion. mostly CGA with cane in RUE and LUE countertop for support but needs minA when only using cane. Posterior bias   Wheelchair Mobility     Tilt Bed    Modified Rankin (Stroke Patients Only)       Balance Overall balance assessment: Needs assistance, History of Falls Sitting-balance support: No upper extremity supported, Feet supported Sitting balance-Leahy Scale: Good   Postural control: Posterior lean Standing balance support: During functional activity, No upper extremity supported, Single extremity supported  Standing balance-Leahy Scale: Poor Standing balance comment: poor with higher level balance challenges (high marches/foot tap on step, etc) but fair with cane                             Cognition Arousal: Alert Behavior During Therapy: Flat affect, Impulsive Overall Cognitive Status: No family/caregiver present to determine baseline cognitive functioning Area of Impairment: Following commands, Safety/judgement, Problem solving                       Following Commands: Follows one step commands with increased time Safety/Judgement: Decreased awareness of safety, Decreased awareness of deficits   Problem Solving: Slow processing, Requires verbal cues, Requires tactile cues General Comments: Pt lacks insight into deficits, very motivated to continue mobility and go home. Mildly impulsive.        Exercises      General Comments General comments (skin integrity, edema, etc.): HR to 115 bpm with exertion; L forearm red/swollen around IV site observed, IV fluid leaking down his arm (RN notified at beginning of session and paused IV for amb), LUE elevated on pillow at end of session      Pertinent Vitals/Pain Pain Assessment Pain Assessment: Faces Faces Pain Scale: Hurts a little bit Pain Location: L forearm where IV appears to be infiltrated (RN notified) Pain Descriptors / Indicators: Discomfort, Grimacing Pain Intervention(s): Monitored during session (LUE elevated on pillows for edema relief)    Home Living                          Prior Function            PT Goals (current goals can now be found in the care plan section) Acute Rehab PT Goals Patient Stated Goal: home PT Goal Formulation: With patient Time For Goal Achievement: 11/11/22 Progress towards PT goals: Progressing toward goals    Frequency    Min 1X/week      PT Plan      Co-evaluation              AM-PAC PT "6 Clicks" Mobility   Outcome Measure  Help needed turning from your back to your side while in a flat bed without using bedrails?: None Help needed moving from lying on your back to sitting on the side of a flat bed  without using bedrails?: A Little Help needed moving to and from a bed to a chair (including a wheelchair)?: A Little Help needed standing up from a chair using your arms (e.g., wheelchair or bedside chair)?: A Little Help needed to walk in hospital room?: A Little Help needed climbing 3-5 steps with a railing? : A Little 6 Click Score: 19    End of Session   Activity Tolerance: Patient tolerated treatment well Patient left: in bed;with call bell/phone within reach;with bed alarm set;Other (comment) (pt defers to sit in chair; LUE elevated due to IV related swelling) Nurse Communication: Mobility status;Other (comment) (IV appears infiltrated/swollen/red/leaking) PT Visit Diagnosis: Unsteadiness on feet (R26.81);Muscle weakness (generalized) (M62.81)     Time: 4098-1191 PT Time Calculation (min) (ACUTE ONLY): 19 min  Charges:    $Gait Training: 8-22 mins PT General Charges $$ ACUTE PT VISIT: 1 Visit                     Sydney Hasten P., PTA Acute Rehabilitation Services Secure Chat Preferred 9a-5:30pm Office:  [phone]    Angus Palms 10/31/2022, 6:36 PM

## 2022-10-31 NOTE — Progress Notes (Signed)
PROGRESS NOTE    Michael Bender  ZOX:[id number] DOB: 10-12-1945 DOA: 10/27/2022 PCP: Lula Olszewski, MD   Brief Narrative: Michael Bender is a 77 y.o. male with a history of COPD, hypertension, hyperlipidemia, CVA, PAD, chronic pain, constipation.  Patient presented secondary to weakness and multiple falls and noted to have evidence of AKI.  Patient noted to have urinary retention secondary to bladder outlet obstruction and Foley catheter was placed.  Patient also noted to have evidence of rhabdomyolysis.  IV fluids started..   Assessment and Plan:  AKI Likely related to outlet obstruction. Hopefully, not related to rhabdomyolysis diagnosis. Baseline creatinine of about 1.0-1.1. Creatinine of 4.21 on admission. IV fluids started for rhabdomyolysis and foley placed for bladder outlet obstruction. Creatinine down to 2.28. UOP of 4,750 mL over the last 24 hours. -Continue IV fluids -Continue strict in/out -Daily CMP  Acute urinary retention Bladder outlet obstruction Presumed secondary to BPH. Foley placed at outside hospital and patient started on Flomax. Urinalysis significant for microscopic hematuria and mild protein. Foley catheter removed on 9/28 and patient has been voiding without issue.  Traumatic rhabdomyolysis CK of 2,828 on admission. IV fluids started with improvement. PT/OT recommending SNF. CK trended up slightly. -Continue IV fluids -CMP/CK in AM  Hypokalemia Resolved with supplementation.  Prolonged QTc -Continue telemetry  Action tremor Noted this admission. Recommendation for neurology follow-up.  Constipation -Continue MiraLAX and Senna  Elevated AST/ALT Secondary to rhabdomyolysis. Improved initially, now slightly worsened.  COPD Noted. Stable. Not on medication therapy.  Primary hypertension -Continue amlodipine  Hyperlipidemia Patient is on Crestor as an outpatient which is held on admission.  Chronic pain -Continue  oxycodone  Normocytic anemia Normal vitamin B12. Anemia may be acute vs chronic. Last hemoglobin of 13.5 g/dL from 08/1189. Hemoglobin of 10.5 g/dL on admission. History of iron deficiency anemia in 2021. Anemia panel suggests no iron deficiency.    DVT prophylaxis: Place and maintain sequential compression device Start: 10/28/22 0336 Code Status:   Code Status: Full Code Family Communication: None at bedside Disposition Plan: Discharge to SNF if patient agreeable vs home likely in 1 day pending continued improvement of renal function and improvement of CK   Consultants:  None  Procedures:  None  Antimicrobials: None    Subjective: No concerns per patient. Eager to discharge.  Objective: BP (!) 180/90   Pulse 100   Temp 98.6 F (37 C) (Oral)   Resp 18   Ht 5\' 11"  (1.803 m)   Wt 62 kg   SpO2 96%   BMI 19.05 kg/m   Examination:  General exam: Appears calm and comfortable Respiratory system: Clear to auscultation. Respiratory effort normal. Cardiovascular system: S1 & S2 heard, RRR. Gastrointestinal system: Abdomen is nondistended, soft and nontender. Normal bowel sounds heard. Central nervous system: Alert and oriented. No focal neurological deficits. Psychiatry: Judgement and insight appear normal. Mood & affect appropriate.    Data Reviewed: I have personally reviewed following labs and imaging studies  CBC Lab Results  Component Value Date   WBC 4.5 10/31/2022   RBC 3.53 (L) 10/31/2022   HGB 11.3 (L) 10/31/2022   HCT 32.5 (L) 10/31/2022   MCV 92.1 10/31/2022   MCH 32.0 10/31/2022   PLT 144 (L) 10/31/2022   MCHC 34.8 10/31/2022   RDW 13.1 10/31/2022   LYMPHSABS 2.2 05/03/2022   MONOABS 0.7 05/03/2022   EOSABS 0.3 05/03/2022   BASOSABS 0.0 05/03/2022     Last metabolic panel Lab Results  Component Value Date   NA 140 10/31/2022   K 3.6 10/31/2022   CL 104 10/31/2022   CO2 28 10/31/2022   BUN 13 10/31/2022   CREATININE 2.13 (H) 10/31/2022    GLUCOSE 124 (H) 10/31/2022   GFRNONAA 31 (L) 10/31/2022   GFRAA >60 07/20/2019   CALCIUM 8.1 (L) 10/31/2022   PHOS 3.7 10/28/2022   PROT 5.8 (L) 10/31/2022   ALBUMIN 2.5 (L) 10/31/2022   BILITOT 0.8 10/31/2022   ALKPHOS 130 (H) 10/31/2022   AST 114 (H) 10/31/2022   ALT 37 10/31/2022   ANIONGAP 8 10/31/2022    GFR: Estimated Creatinine Clearance: 25.5 mL/min (A) (by C-G formula based on SCr of 2.13 mg/dL (H)).  No results found for this or any previous visit (from the past 240 hour(s)).    Radiology Studies: No results found.    LOS: 3 days    Jacquelin Hawking, MD [facility] 10/31/2022, 2:58 PM   If 7PM-7AM, please contact night-coverage [email or web address]

## 2022-10-31 NOTE — Plan of Care (Signed)
  Problem: Activity: Goal: Risk for activity intolerance will decrease Outcome: Progressing   Problem: Safety: Goal: Ability to remain free from injury will improve Outcome: Progressing   

## 2022-10-31 NOTE — Progress Notes (Signed)
Mobility Specialist Progress Note:    10/31/22 1157  Mobility  Activity Ambulated with assistance in hallway  Level of Assistance Contact guard assist, steadying assist  Assistive Device Other (Comment) (IV Pole)  Distance Ambulated (ft) 300 ft  Activity Response Tolerated well  Mobility Referral Yes  $Mobility charge 1 Mobility  Mobility Specialist Start Time (ACUTE ONLY) 1115  Mobility Specialist Stop Time (ACUTE ONLY) 1130  Mobility Specialist Time Calculation (min) (ACUTE ONLY) 15 min   Received pt in bed having no complaints and agreeable to mobility. Pt was asymptomatic throughout ambulation and returned to room w/o fault. Pt kicked to IV Pole 3 times but no LOB. Left in bed w/ call bell in reach and all needs met. Bed alarm on.    Thompson Grayer Mobility Specialist  Please contact vis Secure Chat or  Rehab Office [phone]

## 2022-10-31 NOTE — Plan of Care (Signed)
  Problem: Clinical Measurements: Goal: Diagnostic test results will improve Outcome: Progressing   Problem: Activity: Goal: Risk for activity intolerance will decrease Outcome: Progressing   

## 2022-10-31 NOTE — Care Management Important Message (Signed)
Important Message  Patient Details  Name: Michael Bender MRN: [id number] Date of Birth: September 02, 1945   Important Message Given:  Yes - Medicare IM     Dorena Bodo 10/31/2022, 3:12 PM

## 2022-11-01 DIAGNOSIS — N179 Acute kidney failure, unspecified: Secondary | ICD-10-CM | POA: Diagnosis not present

## 2022-11-01 LAB — COMPREHENSIVE METABOLIC PANEL
ALT: 43 U/L (ref 0–44)
AST: 146 U/L — ABNORMAL HIGH (ref 15–41)
Albumin: 2.4 g/dL — ABNORMAL LOW (ref 3.5–5.0)
Alkaline Phosphatase: 121 U/L (ref 38–126)
Anion gap: 13 (ref 5–15)
BUN: 11 mg/dL (ref 8–23)
CO2: 28 mmol/L (ref 22–32)
Calcium: 8 mg/dL — ABNORMAL LOW (ref 8.9–10.3)
Chloride: 98 mmol/L (ref 98–111)
Creatinine, Ser: 1.65 mg/dL — ABNORMAL HIGH (ref 0.61–1.24)
GFR, Estimated: 43 mL/min — ABNORMAL LOW (ref >60)
Glucose, Bld: 128 mg/dL — ABNORMAL HIGH (ref 70–99)
Potassium: 2.6 mmol/L — CL (ref 3.5–5.1)
Sodium: 139 mmol/L (ref 135–145)
Total Bilirubin: 0.2 mg/dL — ABNORMAL LOW (ref 0.3–1.2)
Total Protein: 5.6 g/dL — ABNORMAL LOW (ref 6.5–8.1)

## 2022-11-01 LAB — MAGNESIUM
Magnesium: 1.3 mg/dL — ABNORMAL LOW (ref 1.7–2.4)
Magnesium: 2.4 mg/dL (ref 1.7–2.4)

## 2022-11-01 LAB — CK: Total CK: 482 U/L — ABNORMAL HIGH (ref 49–397)

## 2022-11-01 LAB — POTASSIUM
Potassium: 2.7 mmol/L — CL (ref 3.5–5.1)
Potassium: 3.6 mmol/L (ref 3.5–5.1)

## 2022-11-01 MED ORDER — POTASSIUM CHLORIDE CRYS ER 20 MEQ PO TBCR
40.0000 meq | EXTENDED_RELEASE_TABLET | ORAL | Status: AC
  Administered 2022-11-01 (×2): 40 meq via ORAL
  Filled 2022-11-01: qty 2

## 2022-11-01 MED ORDER — MAGNESIUM SULFATE 4 GM/100ML IV SOLN
4.0000 g | Freq: Once | INTRAVENOUS | Status: AC
  Administered 2022-11-01: 4 g via INTRAVENOUS
  Filled 2022-11-01: qty 100

## 2022-11-01 MED ORDER — POTASSIUM CHLORIDE CRYS ER 20 MEQ PO TBCR
40.0000 meq | EXTENDED_RELEASE_TABLET | ORAL | Status: AC
  Administered 2022-11-01 (×2): 40 meq via ORAL
  Filled 2022-11-01 (×2): qty 2

## 2022-11-01 NOTE — Progress Notes (Signed)
PROGRESS NOTE    Michael Bender  UYQ:[id number] DOB: 11-Nov-1945 DOA: 10/27/2022 PCP: Lula Olszewski, MD   Brief Narrative: Michael Bender is a 77 y.o. male with a history of COPD, hypertension, hyperlipidemia, CVA, PAD, chronic pain, constipation.  Patient presented secondary to weakness and multiple falls and noted to have evidence of AKI.  Patient noted to have urinary retention secondary to bladder outlet obstruction and Foley catheter was placed.  Patient also noted to have evidence of rhabdomyolysis.  IV fluids started..   Assessment and Plan:  AKI Likely related to outlet obstruction. Hopefully, not related to rhabdomyolysis diagnosis. Baseline creatinine of about 1.0-1.1. Creatinine of 4.21 on admission. IV fluids started for rhabdomyolysis and foley placed for bladder outlet obstruction. Creatinine down to 2.28. UOP of 1,325 mL with two unmeasured occurrences over the last 24 hours. -Continue IV fluids -Continue strict in/out -Daily CMP  Acute urinary retention Bladder outlet obstruction Presumed secondary to BPH. Foley placed at outside hospital and patient started on Flomax. Urinalysis significant for microscopic hematuria and mild protein. Foley catheter removed on 9/28 and patient has been voiding without issue.  Traumatic rhabdomyolysis CK of 2,828 on admission. IV fluids started with improvement. PT/OT recommending SNF. CK trended up slightly but now has continued to trend down. -Continue IV fluids  Hypokalemia Initially resolved, now recurrent and severe. Potassium down to 2.6. Improvement to 2.7 with Kdur 40 meq x2. Magnesium was low and was corrected. -Continue potassium supplementation -Repeat CMP in AM  Hypomagnesemia Resolved with supplementation.  Prolonged QTc -Continue telemetry  Action tremor Noted this admission. Recommendation for neurology follow-up.  Constipation -Continue MiraLAX and Senna  Elevated AST/ALT Secondary to  rhabdomyolysis. Improved initially, now slightly worsened. Recommend patient follow-up with gastroenterology as an outpatient.  Possible chronic pancreatitis Incidentally seen on imaging. Recommend outpatient gastroenterology follow-up. Patient has seen [facility] in the past.  COPD Noted. Stable. Not on medication therapy.  Primary hypertension -Continue amlodipine  Hyperlipidemia Patient is on Crestor as an outpatient which is held on admission.  Chronic pain -Continue oxycodone  Normocytic anemia Normal vitamin B12. Anemia may be acute vs chronic. Last hemoglobin of 13.5 g/dL from 07/3873. Hemoglobin of 10.5 g/dL on admission. History of iron deficiency anemia in 2021. Anemia panel suggests no iron deficiency.    DVT prophylaxis: Place and maintain sequential compression device Start: 10/28/22 0336 Code Status:   Code Status: Full Code Family Communication: None at bedside Disposition Plan: Discharge to SNF if patient agreeable vs home likely in 1 day pending continued improvement of renal function and improvement of CK   Consultants:  None  Procedures:  None  Antimicrobials: None    Subjective: No concerns per patient. Eager to discharge.  Objective: BP (!) 152/81   Pulse 84   Temp 97.7 F (36.5 C) (Oral)   Resp 15   Ht 5\' 11"  (1.803 m)   Wt 61.7 kg   SpO2 95%   BMI 18.97 kg/m   Examination:  General exam: Appears calm and comfortable Respiratory system: Clear to auscultation. Respiratory effort normal. Cardiovascular system: S1 & S2 heard, RRR. Gastrointestinal system: Abdomen is nondistended, soft and nontender. Normal bowel sounds heard. Central nervous system: Alert and oriented. No focal neurological deficits. Psychiatry: Judgement and insight appear normal. Mood & affect appropriate.    Data Reviewed: I have personally reviewed following labs and imaging studies  CBC Lab Results  Component Value Date   WBC 4.5 10/31/2022   RBC 3.53 (L)  10/31/2022   HGB 11.3 (L) 10/31/2022   HCT 32.5 (L) 10/31/2022   MCV 92.1 10/31/2022   MCH 32.0 10/31/2022   PLT 144 (L) 10/31/2022   MCHC 34.8 10/31/2022   RDW 13.1 10/31/2022   LYMPHSABS 2.2 05/03/2022   MONOABS 0.7 05/03/2022   EOSABS 0.3 05/03/2022   BASOSABS 0.0 05/03/2022     Last metabolic panel Lab Results  Component Value Date   NA 139 11/01/2022   K 2.7 (LL) 11/01/2022   CL 98 11/01/2022   CO2 28 11/01/2022   BUN 11 11/01/2022   CREATININE 1.65 (H) 11/01/2022   GLUCOSE 128 (H) 11/01/2022   GFRNONAA 43 (L) 11/01/2022   GFRAA >60 07/20/2019   CALCIUM 8.0 (L) 11/01/2022   PHOS 3.7 10/28/2022   PROT 5.6 (L) 11/01/2022   ALBUMIN 2.4 (L) 11/01/2022   BILITOT 0.2 (L) 11/01/2022   ALKPHOS 121 11/01/2022   AST 146 (H) 11/01/2022   ALT 43 11/01/2022   ANIONGAP 13 11/01/2022    GFR: Estimated Creatinine Clearance: 32.7 mL/min (A) (by C-G formula based on SCr of 1.65 mg/dL (H)).  No results found for this or any previous visit (from the past 240 hour(s)).    Radiology Studies: No results found.    LOS: 4 days    Jacquelin Hawking, MD [facility] 11/01/2022, 1:31 PM   If 7PM-7AM, please contact night-coverage [email or web address]

## 2022-11-01 NOTE — Progress Notes (Signed)
Pt. With K+ of 2.6 this am. On call for [facility] paged to make aware.

## 2022-11-01 NOTE — Plan of Care (Signed)
  Problem: Education: Goal: Knowledge of General Education information will improve Description: Including pain rating scale, medication(s)/side effects and non-pharmacologic comfort measures Outcome: Progressing   Problem: Health Behavior/Discharge Planning: Goal: Ability to manage health-related needs will improve Outcome: Progressing   Problem: Clinical Measurements: Goal: Ability to maintain clinical measurements within normal limits will improve Outcome: Progressing Goal: Respiratory complications will improve Outcome: Progressing Goal: Cardiovascular complication will be avoided Outcome: Progressing   Problem: Activity: Goal: Risk for activity intolerance will decrease Outcome: Progressing   Problem: Nutrition: Goal: Adequate nutrition will be maintained Outcome: Progressing   Problem: Coping: Goal: Level of anxiety will decrease Outcome: Progressing   Problem: Elimination: Goal: Will not experience complications related to bowel motility Outcome: Progressing Goal: Will not experience complications related to urinary retention Outcome: Progressing   Problem: Pain Managment: Goal: General experience of comfort will improve Outcome: Progressing   Problem: Skin Integrity: Goal: Risk for impaired skin integrity will decrease Outcome: Progressing

## 2022-11-01 NOTE — Plan of Care (Signed)
  Problem: Activity: Goal: Risk for activity intolerance will decrease Outcome: Progressing   Problem: Safety: Goal: Ability to remain free from injury will improve Outcome: Progressing   

## 2022-11-01 NOTE — Consult Note (Signed)
[facility]  [facility] Inpatient Consult   11/01/2022  [name] [birth year] [id number]  [facility] [[facility]]  Accountable Care Organization [ACO] Patient: Michael Bender  Primary Care Provider: Lula Olszewski, MD, with [place] at [facility], this provider is listed for the transition of care follow up appointments and calls   [facility] Liaison met patient at bedside at [facility].   The patient was screened for barriers to post hospital care needs for readmission prevention. Patient is medium risk score for unplanned readmission risk, however patient declining recommendations for SNF and HH.  The patient was assessed for potential [facility]) Care Management service needs for post hospital transition for care coordination. Review of patient's electronic medical record reveals patient has a history of multiple falls.  Met with the patient at the bedside to assess for needs for returning home.  Patient could not tell this writer exactly who is support would be when returning home. He did accept for telephonic follow up. States, "there's a neighbor near by and people on the same road as I am, I could ask for help from." Asked patient regarding son, he would only say he could be contacted for calls. Patient was accepting and generous. Explained continue to follow his progress.  Plan: [facility] Liaison will continue to follow progress and disposition to asess for post hospital community care coordination/management needs.  Referral request for community care coordination: anticipate Community TOC follow up calls.   Community Care Management/Population Health does not replace or interfere with any arrangements made by the Inpatient Transition of Care team.   For questions contact:   Charlesetta Shanks, RN, BSN, CCM [place]  [facility], [facility] Liaison Direct Dial: [phone] or secure  chat Website: [email or web address]

## 2022-11-01 NOTE — Progress Notes (Signed)
Mobility Specialist Progress Note:    11/01/22 1057  Mobility  Activity Ambulated with assistance to bathroom  Level of Assistance Contact guard assist, steadying assist  Assistive Device Other (Comment) (HHA)  Distance Ambulated (ft) 10 ft  Activity Response Tolerated well  Mobility Referral Yes  $Mobility charge 1 Mobility  Mobility Specialist Start Time (ACUTE ONLY) 0935  Mobility Specialist Stop Time (ACUTE ONLY) 0948  Mobility Specialist Time Calculation (min) (ACUTE ONLY) 13 min   Pt received sitting EOB, requesting to use the BR. Upon standing pt experienced an episode of BM incontinence. Was able to takes steps towards the BR w/o fault. Assisted in linen change. Left in BR and demonstrated how to use the call bell and NT notified.   Thompson Grayer Mobility Specialist  Please contact vis Secure Chat or  Rehab Office [phone]

## 2022-11-02 DIAGNOSIS — N179 Acute kidney failure, unspecified: Secondary | ICD-10-CM | POA: Diagnosis not present

## 2022-11-02 LAB — COMPREHENSIVE METABOLIC PANEL
ALT: 37 U/L (ref 0–44)
AST: 84 U/L — ABNORMAL HIGH (ref 15–41)
Albumin: 2.3 g/dL — ABNORMAL LOW (ref 3.5–5.0)
Alkaline Phosphatase: 117 U/L (ref 38–126)
Anion gap: 9 (ref 5–15)
BUN: 9 mg/dL (ref 8–23)
CO2: 25 mmol/L (ref 22–32)
Calcium: 7.9 mg/dL — ABNORMAL LOW (ref 8.9–10.3)
Chloride: 104 mmol/L (ref 98–111)
Creatinine, Ser: 1.61 mg/dL — ABNORMAL HIGH (ref 0.61–1.24)
GFR, Estimated: 44 mL/min — ABNORMAL LOW (ref >60)
Glucose, Bld: 195 mg/dL — ABNORMAL HIGH (ref 70–99)
Potassium: 3.1 mmol/L — ABNORMAL LOW (ref 3.5–5.1)
Sodium: 138 mmol/L (ref 135–145)
Total Bilirubin: 0.3 mg/dL (ref 0.3–1.2)
Total Protein: 5.7 g/dL — ABNORMAL LOW (ref 6.5–8.1)

## 2022-11-02 MED ORDER — TAMSULOSIN HCL 0.4 MG PO CAPS: 0.4000 mg | ORAL_CAPSULE | Freq: Every day | ORAL | 0 refills | Status: DC

## 2022-11-02 MED ORDER — POLYETHYLENE GLYCOL 3350 17 G PO PACK: 17.0000 g | PACK | Freq: Every day | ORAL | 0 refills | Status: AC | PRN

## 2022-11-02 MED ORDER — POTASSIUM CHLORIDE CRYS ER 20 MEQ PO TBCR: 20.0000 meq | EXTENDED_RELEASE_TABLET | Freq: Two times a day (BID) | ORAL | 0 refills | Status: DC

## 2022-11-02 MED ORDER — POTASSIUM CHLORIDE CRYS ER 20 MEQ PO TBCR
40.0000 meq | EXTENDED_RELEASE_TABLET | ORAL | Status: DC
  Administered 2022-11-02: 40 meq via ORAL
  Filled 2022-11-02: qty 2

## 2022-11-02 MED ORDER — AMLODIPINE BESYLATE 10 MG PO TABS: 10.0000 mg | ORAL_TABLET | Freq: Every day | ORAL | 0 refills | Status: DC

## 2022-11-02 NOTE — Progress Notes (Signed)
Pt is discharge paper work complete with tele and IV removed. Right Hand Swollen and hot to touch Nurse and MD aware. Cold compress applied per MD orders.

## 2022-11-02 NOTE — TOC Transition Note (Signed)
Transition of Care [facility]) - CM/SW Discharge Note   Patient Details  Name: Michael Bender MRN: [id number] Date of Birth: [birth year]  Transition of Care [facility]) CM/SW Contact:  Leone Haven, RN Phone Number: 11/02/2022, 10:43 AM   Clinical Narrative:    Patient is for dc home today, he has transportation, he is refusing [facility] services.      Barriers to Discharge: Continued Medical Work up   Patient Goals and CMS Choice   Choice offered to / list presented to : NA  Discharge Placement                         Discharge Plan and Services Additional resources added to the After Visit Summary for   In-house Referral: NA Discharge Planning Services: CM Consult Post Acute Care Choice: Home Health          DME Arranged: N/A DME Agency: NA       HH Arranged: NA          Social Determinants of Health (SDOH) Interventions SDOH Screenings   Food Insecurity: No Food Insecurity (10/13/2022)  Housing: Low Risk  (10/13/2022)  Transportation Needs: No Transportation Needs (10/13/2022)  Utilities: Not At Risk (10/13/2022)  Alcohol Screen: Low Risk  (07/14/2021)  Depression (PHQ2-9): Low Risk  (10/13/2022)  Financial Resource Strain: Low Risk  (10/13/2022)  Physical Activity: Insufficiently Active (10/13/2022)  Social Connections: Socially Isolated (10/13/2022)  Stress: No Stress Concern Present (10/13/2022)  Tobacco Use: High Risk (10/27/2022)  Health Literacy: Inadequate Health Literacy (10/13/2022)     Readmission Risk Interventions     No data to display

## 2022-11-02 NOTE — Discharge Summary (Signed)
Physician Discharge Summary  Michael Bender:[id number] DOB: [birth year] DOA: 10/27/2022  PCP: Lula Olszewski, MD  Admit date: 10/27/2022 Discharge date: 11/02/2022  Admitted From: Home Disposition: Home  Recommendations for Outpatient Follow-up:  Follow up with PCP in 1 week with repeat CBC/BMP Follow up in ED if symptoms worsen or new appear   Home Health: No.  Patient refused SNF placement or home health PT. Equipment/Devices: None  Discharge Condition: Stable CODE STATUS: Full Diet recommendation: Heart healthy  Brief/Interim Summary: 77 y.o. male with a history of COPD, hypertension, hyperlipidemia, CVA, PAD, chronic pain, constipation. Patient presented secondary to weakness and multiple falls and noted to have evidence of AKI. Patient noted to have urinary retention secondary to bladder outlet obstruction and Foley catheter was placed. Patient also noted to have evidence of rhabdomyolysis. IV fluids started.  During the hospitalization, his condition has improved including improving creatinine and CK total.  Foley catheter has already been removed.  PT recommended SNF placement but patient refusing SNF or home health PT.  He feels much better today and wants to go home today.  Discharge home today with outpatient follow-up with PCP.  Discharge Diagnoses:   AKI Likely related to outlet obstruction. Hopefully, not related to rhabdomyolysis diagnosis. Baseline creatinine of about 1.0-1.1. Creatinine of 4.21 on admission. IV fluids started for rhabdomyolysis and foley placed for bladder outlet obstruction. Creatinine down to 1.61 today.  Having good urine output. -Outpatient follow-up of BMP.  Patient feels much better and was 200.  Discharge patient home today.    Acute urinary retention Bladder outlet obstruction Presumed secondary to BPH. Foley placed at outside hospital and patient started on Flomax. Urinalysis significant for microscopic hematuria and mild protein. Foley  catheter removed on 9/28 and patient has been voiding without issue. -Continue Flomax on discharge.  Recommend outpatient evaluation and follow-up by urology.   Traumatic rhabdomyolysis CK of 2,828 on admission. IV fluids started with improvement. CK trended up slightly but now has continued to trend down. -CK4 82 on 11/01/2022.  Hypokalemia -Continue replacement on discharge.  Outpatient follow-up of BMP.   Hypomagnesemia Resolved with supplementation.   Prolonged QTc -Outpatient follow-up.  Action tremor Noted this admission. Recommendation for neurology follow-up.   Constipation -Continue as needed MiraLAX.  Elevated AST/ALT Secondary to rhabdomyolysis.  Improving.  Outpatient follow-up.     Possible chronic pancreatitis Incidentally seen on imaging. Recommend outpatient gastroenterology follow-up. Patient has seen [facility] in the past.   COPD Noted. Stable. Not on medication therapy.   Primary hypertension -Continue amlodipine   Hyperlipidemia Patient is on Crestor as an outpatient which is held on admission.  Continue to hold on discharge.  Outpatient follow-up with PCP.   Chronic pain -Continue oxycodone   Normocytic anemia Normal vitamin B12. Anemia may be acute vs chronic. Last hemoglobin of 13.5 g/dL from 02/6604. Hemoglobin of 10.5 g/dL on admission. History of iron deficiency anemia in 2021. Anemia panel suggests no iron deficiency.  -Hemoglobin currently stable.  Outpatient follow-up.  Discharge Instructions  Discharge Instructions     Diet - low sodium heart healthy   Complete by: As directed    Increase activity slowly   Complete by: As directed       Allergies as of 11/02/2022       Reactions   Nubain [nalbuphine Hcl]    Muscle contraction        Medication List     STOP taking these medications    rosuvastatin 40  MG tablet Commonly known as: CRESTOR       TAKE these medications    amLODipine 10 MG tablet Commonly known as:  NORVASC Take 1 tablet (10 mg total) by mouth daily. Start taking on: November 03, 2022 What changed:  medication strength See the new instructions.   cyclobenzaprine 10 MG tablet Commonly known as: FLEXERIL Take 10 mg by mouth 3 (three) times daily as needed.   Oxycodone HCl 10 MG Tabs Take 1 tablet by mouth every 8 (eight) hours as needed.   pantoprazole 40 MG tablet Commonly known as: PROTONIX TAKE 1 TABLET BY MOUTH EVERY DAY BEFORE BREAKFAST What changed: See the new instructions.   polyethylene glycol 17 g packet Commonly known as: MIRALAX / GLYCOLAX Take 17 g by mouth daily as needed for moderate constipation.   potassium chloride SA 20 MEQ tablet Commonly known as: KLOR-CON M Take 1 tablet (20 mEq total) by mouth 2 (two) times daily.   SUMAtriptan 50 MG tablet Commonly known as: IMITREX Take 50 mg by mouth every 2 (two) hours as needed for migraine or headache.   tamsulosin 0.4 MG Caps capsule Commonly known as: FLOMAX Take 1 capsule (0.4 mg total) by mouth daily after supper.        Follow-up Information     Lula Olszewski, MD. Go on 11/10/2022.   Specialty: Internal Medicine Why: @1 :40pm Contact information: [address] [place] [address] [phone]                Allergies  Allergen Reactions   Nubain [Nalbuphine Hcl]     Muscle contraction    Consultations: None   Procedures/Studies: CT HEAD WO CONTRAST ( )  Result Date: 10/28/2022 CLINICAL DATA:  77 year old male with falls, head injury. EXAM: CT HEAD WITHOUT CONTRAST TECHNIQUE: Contiguous axial images were obtained from the base of the skull through the vertex without intravenous contrast. RADIATION DOSE REDUCTION: This exam was performed according to the departmental dose-optimization program which includes automated exposure control, adjustment of the mA and/or kV according to patient size and/or use of iterative reconstruction technique. COMPARISON:  Brain MRI  02/16/2019. FINDINGS: Brain: Cerebral volume appears stable since 2021. Chronic lacunar infarct of the left thalamus was present at that time, but a larger chronic appearing lacunar infarct of the left corona radiata and lentiform is new since that time. Patchy white matter hypodensity elsewhere not significantly changed. Posterior fossa remains within normal limits. No midline shift, ventriculomegaly, mass effect, evidence of mass lesion, intracranial hemorrhage or evidence of cortically based acute infarction. Vascular: Calcified atherosclerosis at the skull base. No suspicious intracranial vascular hyperdensity. Skull: No acute osseous abnormality identified. Sinuses/Orbits: Chronic paranasal sinus disease, especially bulky sphenoid mucoperiosteal thickening. But other paranasal sinuses, tympanic cavities and mastoids remain well aerated. Other: No acute orbit or scalp soft tissue injury identified. Postoperative changes to the globes. IMPRESSION: 1. No acute intracranial abnormality or acute traumatic injury identified. 2. Progressed chronic small vessel disease since 2021 in the left corona radiata, left basal ganglia. Electronically Signed   By: Odessa Fleming M.D.   On: 10/28/2022 05:45   DG Chest 2 View  Result Date: 10/27/2022 CLINICAL DATA:  Progressive weakness EXAM: CHEST - 2 VIEW COMPARISON:  07/17/2019 FINDINGS: The heart size and mediastinal contours are within normal limits. Aortic atherosclerosis. Both lungs are clear. The visualized skeletal structures are unremarkable. IMPRESSION: No active cardiopulmonary disease. Electronically Signed   By: Jasmine Pang M.D.   On: 10/27/2022 22:53  CT ABDOMEN PELVIS WO CONTRAST  Result Date: 10/27/2022 CLINICAL DATA:  Abdominal pain EXAM: CT ABDOMEN AND PELVIS WITHOUT CONTRAST TECHNIQUE: Multidetector CT imaging of the abdomen and pelvis was performed following the standard protocol without IV contrast. RADIATION DOSE REDUCTION: This exam was performed  according to the departmental dose-optimization program which includes automated exposure control, adjustment of the mA and/or kV according to patient size and/or use of iterative reconstruction technique. COMPARISON:  CT 07/17/2019 FINDINGS: Lower chest: Lung bases demonstrate no acute airspace disease. Mild coronary vascular calcification. Hepatobiliary: No calcified gallstone or biliary dilatation. Hepatic cyst in the left hepatic dome. Pancreas: Markedly atrophic. Scattered calcifications presumably due to chronic pancreatitis. No acute inflammation Spleen: Normal in size without focal abnormality. Adrenals/Urinary Tract: Adrenal glands are normal. Prominent renal collecting systems without obstructing stone. Distended urinary bladder. Renal cysts. No imaging follow-up is recommended Stomach/Bowel: The stomach is nonenlarged. There is no dilated small bowel. Extensive stool throughout the colon with hyperdense material. Postsurgical changes at the rectum Vascular/Lymphatic: Moderate aortic atherosclerosis. No aneurysm. No suspicious lymph nodes Reproductive: Prostate unremarkable Other: Negative for pelvic effusion or free air. Musculoskeletal: No acute or suspicious osseous abnormality. Posterior spinal fusion hardware at L4-L5. IMPRESSION: 1. Negative for bowel obstruction or acute inflammatory process. 2. Extensive stool throughout the colon with hyperdense material, question constipation. 3. Distended urinary bladder with prominent renal collecting systems. 4. Atrophic pancreas with scattered calcifications presumably due to chronic pancreatitis. 5. Aortic atherosclerosis. Aortic Atherosclerosis (ICD10-I70.0). Electronically Signed   By: Jasmine Pang M.D.   On: 10/27/2022 22:51      Subjective: Patient seen and examined at bedside.  Wants to go home today.  No fever, vomiting, chest pain or shortness of breath reported.  Discharge Exam: Vitals:   11/02/22 0456 11/02/22 0803  BP: (!) 177/88 (!)  176/81  Pulse: 82 88  Resp: 19 18  Temp: 99 F (37.2 C) 98.9 F (37.2 C)  SpO2: 100% 96%    General: Pt is alert, awake, not in acute distress.  On room air.  Looks chronically ill and deconditioned. Cardiovascular: rate controlled, S1/S2 + Respiratory: bilateral decreased breath sounds at bases Abdominal: Soft, NT, ND, bowel sounds + Extremities: no edema, no cyanosis    The results of significant diagnostics from this hospitalization (including imaging, microbiology, ancillary and laboratory) are listed below for reference.     Microbiology: No results found for this or any previous visit (from the past 240 hour(s)).   Labs: BNP (last 3 results) No results for input(s): "BNP" in the last 8760 hours. Basic Metabolic Panel: Recent Labs  Lab 10/27/22 1809 10/28/22 1610 10/29/22 0258 10/30/22 0851 10/31/22 0405 11/01/22 0418 11/01/22 1137 11/01/22 1811 11/02/22 0306  NA 134* 138 142 143 140 139  --   --  138  K 2.1* 2.6* 3.2* 3.6 3.6 2.6* 2.7* 3.6 3.1*  CL 99 104 106 101 104 98  --   --  104  CO2 24 20* 24 27 28 28   --   --  25  GLUCOSE 126* 82 147* 183* 124* 128*  --   --  195*  BUN 26* 25* 24* 17 13 11   --   --  9  CREATININE 4.21* 3.62* 3.19* 2.28* 2.13* 1.65*  --   --  1.61*  CALCIUM 7.4* 7.6* 7.8* 8.2* 8.1* 8.0*  --   --  7.9*  MG 1.9 2.5*  --   --   --  1.3* 2.4  --   --  PHOS  --  3.7  --   --   --   --   --   --   --    Liver Function Tests: Recent Labs  Lab 10/27/22 1809 10/28/22 0648 10/31/22 0405 11/01/22 0418 11/02/22 0306  AST 126* 95* 114* 146* 84*  ALT 49* 39 37 43 37  ALKPHOS 153* 134* 130* 121 117  BILITOT 0.2* 0.5 0.8 0.2* 0.3  PROT 6.5 5.5* 5.8* 5.6* 5.7*  ALBUMIN 2.9* 2.4* 2.5* 2.4* 2.3*   No results for input(s): "LIPASE", "AMYLASE" in the last 168 hours. No results for input(s): "AMMONIA" in the last 168 hours. CBC: Recent Labs  Lab 10/27/22 1809 10/31/22 0405  WBC 5.4 4.5  HGB 10.5* 11.3*  HCT 30.7* 32.5*  MCV 91.1 92.1   PLT 133* 144*   Cardiac Enzymes: Recent Labs  Lab 10/28/22 0648 10/29/22 0258 10/30/22 0851 10/31/22 0402 11/01/22 0418  CKTOTAL 2,043* 992* 802* 818* 482*   BNP: Invalid input(s): "POCBNP" CBG: No results for input(s): "GLUCAP" in the last 168 hours. D-Dimer No results for input(s): "DDIMER" in the last 72 hours. Hgb A1c No results for input(s): "HGBA1C" in the last 72 hours. Lipid Profile No results for input(s): "CHOL", "HDL", "LDLCALC", "TRIG", "CHOLHDL", "LDLDIRECT" in the last 72 hours. Thyroid function studies No results for input(s): "TSH", "T4TOTAL", "T3FREE", "THYROIDAB" in the last 72 hours.  Invalid input(s): "FREET3" Anemia work up No results for input(s): "VITAMINB12", "FOLATE", "FERRITIN", "TIBC", "IRON", "RETICCTPCT" in the last 72 hours. Urinalysis    Component Value Date/Time   COLORURINE YELLOW 10/27/2022 2033   APPEARANCEUR CLEAR 10/27/2022 2033   LABSPEC 1.010 10/27/2022 2033   PHURINE 6.0 10/27/2022 2033   GLUCOSEU NEGATIVE 10/27/2022 2033   HGBUR LARGE (A) 10/27/2022 2033   BILIRUBINUR NEGATIVE 10/27/2022 2033   BILIRUBINUR n 09/08/2015 1402   KETONESUR NEGATIVE 10/27/2022 2033   PROTEINUR 100 (A) 10/27/2022 2033   UROBILINOGEN 0.2 09/08/2015 1402   NITRITE NEGATIVE 10/27/2022 2033   LEUKOCYTESUR NEGATIVE 10/27/2022 2033   Sepsis Labs Recent Labs  Lab 10/27/22 1809 10/31/22 0405  WBC 5.4 4.5   Microbiology No results found for this or any previous visit (from the past 240 hour(s)).   Time coordinating discharge: 35 minutes  SIGNED:   Glade Lloyd, MD  [facility] 11/02/2022, 9:45 AM

## 2022-11-02 NOTE — Progress Notes (Signed)
Physical Therapy Treatment Patient Details Name: Michael Bender MRN: [id number] DOB: 11/08/1945 Today's Date: 11/02/2022   History of Present Illness 77 yo male presents to [facility] on 9/26 with weakness, AKI, acute urinary retention. [facility] 9/27 negative for acute findings but does show "a larger  chronic appearing lacunar infarct of the left corona radiata and lentiform" new since previous CT in 2021.   + rhabdomyolysis. PMH includes COPD, hypertension, history of GI bleed, anemia, hyperlipidemia, CVA 2021, PAD, chronic pain, cervical fusion.    PT Comments  Patient is agreeable to PT session. He is very eager to be discharge home. Challenged patient during ambulation without assistive device ,with head turns, direction changes, sudden stops, distractions. Mild trunk sway that is self corrected noted with no overt loss of balance. Educated patient on techniques to prevent falls and energy conservation strategies as patient is fatigued with activity. Although patient is making progress with functional independence, anticipate patient will benefit from continued PT after this hospital stay.    If plan is discharge home, recommend the following: A little help with walking and/or transfers;A little help with bathing/dressing/bathroom;Help with stairs or ramp for entrance;Assistance with cooking/housework   Can travel by private vehicle     Yes  Equipment Recommendations  None recommended by PT    Recommendations for Other Services       Precautions / Restrictions Precautions Precautions: Fall Restrictions Weight Bearing Restrictions: No     Mobility  Bed Mobility Overal bed mobility: Needs Assistance Bed Mobility: Supine to Sit     Supine to sit: Supervision     General bed mobility comments: cues for task initiation    Transfers Overall transfer level: Needs assistance Equipment used: None Transfers: Sit to/from Stand Sit to Stand: Contact guard assist           General  transfer comment: CGA for safety. no physical lifting assistance required    Ambulation/Gait Ambulation/Gait assistance: Contact guard assist Gait Distance (Feet): 300 Feet Assistive device: None Gait Pattern/deviations: Step-through pattern, Decreased stride length, Narrow base of support Gait velocity: decreased     General Gait Details: challenged patient without device today with head turns, directions changes, and distraction with cues for safety and fall prevention. mild trunk sway occasionally with no overt loss of balance. encouraged patient to continue using his cane for routine ambulation for safety. Mild dyspnea with exertion and cues for pacing and energy conservation strategies discussed   Stairs             Wheelchair Mobility     Tilt Bed    Modified Rankin (Stroke Patients Only)       Balance Overall balance assessment: Needs assistance Sitting-balance support: Feet supported Sitting balance-Leahy Scale: Good     Standing balance support: No upper extremity supported Standing balance-Leahy Scale: Fair                              Cognition Arousal: Alert Behavior During Therapy: Flat affect Overall Cognitive Status: No family/caregiver present to determine baseline cognitive functioning Area of Impairment: Following commands, Safety/judgement, Problem solving                       Following Commands: Follows one step commands with increased time Safety/Judgement: Decreased awareness of safety, Decreased awareness of deficits   Problem Solving: Slow processing, Requires verbal cues, Requires tactile cues  Exercises      General Comments        Pertinent Vitals/Pain Pain Assessment Pain Assessment: No/denies pain    Home Living                          Prior Function            PT Goals (current goals can now be found in the care plan section) Acute Rehab PT Goals Patient Stated Goal: to  go home PT Goal Formulation: With patient Time For Goal Achievement: 11/11/22 Potential to Achieve Goals: Good Progress towards PT goals: Progressing toward goals    Frequency    Min 1X/week      PT Plan      Co-evaluation              AM-PAC PT "6 Clicks" Mobility   Outcome Measure  Help needed turning from your back to your side while in a flat bed without using bedrails?: None Help needed moving from lying on your back to sitting on the side of a flat bed without using bedrails?: A Little Help needed moving to and from a bed to a chair (including a wheelchair)?: A Little Help needed standing up from a chair using your arms (e.g., wheelchair or bedside chair)?: A Little Help needed to walk in hospital room?: A Little Help needed climbing 3-5 steps with a railing? : A Little 6 Click Score: 19    End of Session Equipment Utilized During Treatment: Gait belt Activity Tolerance: Patient tolerated treatment well Patient left: in chair;with call bell/phone within reach;with chair alarm set Nurse Communication: Mobility status PT Visit Diagnosis: Unsteadiness on feet (R26.81);Muscle weakness (generalized) (M62.81)     Time: 1610-9604 PT Time Calculation (min) (ACUTE ONLY): 16 min  Charges:    $Gait Training: 8-22 mins PT General Charges $$ ACUTE PT VISIT: 1 Visit                     Donna Bernard, PT, MPT    [name] 11/02/2022, 9:36 AM

## 2022-11-02 NOTE — Progress Notes (Signed)
Pt complaining of R hand pain. Tender, edematous, red, and warm to touch. MD suggesting warm/cold compresses and outpatient follow up with PCP.

## 2022-11-04 ENCOUNTER — Telehealth: Payer: Self-pay | Admitting: *Deleted

## 2022-11-04 NOTE — Progress Notes (Signed)
Care Coordination  Outreach Note  11/04/2022 Name: Michael Bender MRN: [id number] DOB: 07-Sep-1945   Care Coordination Outreach Attempts: An unsuccessful telephone outreach was attempted today to offer the patient information about available care coordination services.  Follow Up Plan:  Additional outreach attempts will be made to offer the patient care coordination information and services.   Encounter Outcome:  No Answer  Burman Nieves, CCMA Care Coordination Care Guide Direct Dial: [phone]

## 2022-11-05 ENCOUNTER — Inpatient Hospital Stay (HOSPITAL_COMMUNITY)
Admission: EM | Admit: 2022-11-05 | Discharge: 2022-11-14 | DRG: 853 | Disposition: A | Discharge status: Skilled Nursing Facility | Payer: Medicare HMO | Attending: Internal Medicine | Admitting: Internal Medicine

## 2022-11-05 ENCOUNTER — Emergency Department (HOSPITAL_COMMUNITY): Payer: Medicare HMO

## 2022-11-05 ENCOUNTER — Other Ambulatory Visit: Payer: Self-pay

## 2022-11-05 ENCOUNTER — Encounter (HOSPITAL_COMMUNITY): Payer: Self-pay | Admitting: Internal Medicine

## 2022-11-05 DIAGNOSIS — R55 Syncope and collapse: Principal | ICD-10-CM | POA: Diagnosis present

## 2022-11-05 DIAGNOSIS — L089 Local infection of the skin and subcutaneous tissue, unspecified: Secondary | ICD-10-CM

## 2022-11-05 DIAGNOSIS — M19041 Primary osteoarthritis, right hand: Secondary | ICD-10-CM | POA: Diagnosis not present

## 2022-11-05 DIAGNOSIS — M009 Pyogenic arthritis, unspecified: Secondary | ICD-10-CM | POA: Insufficient documentation

## 2022-11-05 DIAGNOSIS — M4802 Spinal stenosis, cervical region: Secondary | ICD-10-CM | POA: Diagnosis not present

## 2022-11-05 DIAGNOSIS — M4632 Infection of intervertebral disc (pyogenic), cervical region: Secondary | ICD-10-CM | POA: Diagnosis not present

## 2022-11-05 DIAGNOSIS — Z7401 Bed confinement status: Secondary | ICD-10-CM | POA: Diagnosis not present

## 2022-11-05 DIAGNOSIS — Z515 Encounter for palliative care: Secondary | ICD-10-CM | POA: Diagnosis not present

## 2022-11-05 DIAGNOSIS — M25432 Effusion, left wrist: Secondary | ICD-10-CM | POA: Diagnosis not present

## 2022-11-05 DIAGNOSIS — M25561 Pain in right knee: Secondary | ICD-10-CM | POA: Diagnosis not present

## 2022-11-05 DIAGNOSIS — R5381 Other malaise: Secondary | ICD-10-CM | POA: Diagnosis present

## 2022-11-05 DIAGNOSIS — N401 Enlarged prostate with lower urinary tract symptoms: Secondary | ICD-10-CM | POA: Diagnosis present

## 2022-11-05 DIAGNOSIS — Z981 Arthrodesis status: Secondary | ICD-10-CM

## 2022-11-05 DIAGNOSIS — N179 Acute kidney failure, unspecified: Secondary | ICD-10-CM | POA: Diagnosis present

## 2022-11-05 DIAGNOSIS — M542 Cervicalgia: Secondary | ICD-10-CM

## 2022-11-05 DIAGNOSIS — S60421D Blister (nonthermal) of left index finger, subsequent encounter: Secondary | ICD-10-CM | POA: Diagnosis not present

## 2022-11-05 DIAGNOSIS — N281 Cyst of kidney, acquired: Secondary | ICD-10-CM | POA: Diagnosis not present

## 2022-11-05 DIAGNOSIS — I951 Orthostatic hypotension: Secondary | ICD-10-CM | POA: Diagnosis present

## 2022-11-05 DIAGNOSIS — Z1152 Encounter for screening for COVID-19: Secondary | ICD-10-CM

## 2022-11-05 DIAGNOSIS — K219 Gastro-esophageal reflux disease without esophagitis: Secondary | ICD-10-CM | POA: Diagnosis present

## 2022-11-05 DIAGNOSIS — Z79899 Other long term (current) drug therapy: Secondary | ICD-10-CM

## 2022-11-05 DIAGNOSIS — I4891 Unspecified atrial fibrillation: Secondary | ICD-10-CM | POA: Diagnosis not present

## 2022-11-05 DIAGNOSIS — B9689 Other specified bacterial agents as the cause of diseases classified elsewhere: Secondary | ICD-10-CM | POA: Diagnosis not present

## 2022-11-05 DIAGNOSIS — I2489 Other forms of acute ischemic heart disease: Secondary | ICD-10-CM | POA: Diagnosis present

## 2022-11-05 DIAGNOSIS — R7881 Bacteremia: Secondary | ICD-10-CM | POA: Diagnosis present

## 2022-11-05 DIAGNOSIS — M25532 Pain in left wrist: Secondary | ICD-10-CM | POA: Diagnosis not present

## 2022-11-05 DIAGNOSIS — R531 Weakness: Secondary | ICD-10-CM | POA: Diagnosis not present

## 2022-11-05 DIAGNOSIS — E1151 Type 2 diabetes mellitus with diabetic peripheral angiopathy without gangrene: Secondary | ICD-10-CM | POA: Diagnosis present

## 2022-11-05 DIAGNOSIS — Z888 Allergy status to other drugs, medicaments and biological substances status: Secondary | ICD-10-CM

## 2022-11-05 DIAGNOSIS — I129 Hypertensive chronic kidney disease with stage 1 through stage 4 chronic kidney disease, or unspecified chronic kidney disease: Secondary | ICD-10-CM | POA: Diagnosis not present

## 2022-11-05 DIAGNOSIS — R636 Underweight: Secondary | ICD-10-CM | POA: Diagnosis present

## 2022-11-05 DIAGNOSIS — T847XXA Infection and inflammatory reaction due to other internal orthopedic prosthetic devices, implants and grafts, initial encounter: Secondary | ICD-10-CM

## 2022-11-05 DIAGNOSIS — M7989 Other specified soft tissue disorders: Secondary | ICD-10-CM | POA: Diagnosis not present

## 2022-11-05 DIAGNOSIS — M47812 Spondylosis without myelopathy or radiculopathy, cervical region: Secondary | ICD-10-CM | POA: Diagnosis not present

## 2022-11-05 DIAGNOSIS — R52 Pain, unspecified: Secondary | ICD-10-CM

## 2022-11-05 DIAGNOSIS — H538 Other visual disturbances: Secondary | ICD-10-CM | POA: Diagnosis present

## 2022-11-05 DIAGNOSIS — M79645 Pain in left finger(s): Secondary | ICD-10-CM

## 2022-11-05 DIAGNOSIS — J449 Chronic obstructive pulmonary disease, unspecified: Secondary | ICD-10-CM | POA: Diagnosis not present

## 2022-11-05 DIAGNOSIS — R296 Repeated falls: Secondary | ICD-10-CM | POA: Diagnosis present

## 2022-11-05 DIAGNOSIS — E876 Hypokalemia: Secondary | ICD-10-CM | POA: Diagnosis present

## 2022-11-05 DIAGNOSIS — G8929 Other chronic pain: Secondary | ICD-10-CM | POA: Diagnosis present

## 2022-11-05 DIAGNOSIS — M00832 Arthritis due to other bacteria, left wrist: Secondary | ICD-10-CM | POA: Diagnosis not present

## 2022-11-05 DIAGNOSIS — T1490XA Injury, unspecified, initial encounter: Secondary | ICD-10-CM | POA: Diagnosis present

## 2022-11-05 DIAGNOSIS — R4182 Altered mental status, unspecified: Secondary | ICD-10-CM | POA: Diagnosis not present

## 2022-11-05 DIAGNOSIS — M5021 Other cervical disc displacement,  high cervical region: Secondary | ICD-10-CM | POA: Diagnosis not present

## 2022-11-05 DIAGNOSIS — F17210 Nicotine dependence, cigarettes, uncomplicated: Secondary | ICD-10-CM | POA: Diagnosis not present

## 2022-11-05 DIAGNOSIS — Z743 Need for continuous supervision: Secondary | ICD-10-CM | POA: Diagnosis not present

## 2022-11-05 DIAGNOSIS — Z7189 Other specified counseling: Secondary | ICD-10-CM | POA: Diagnosis not present

## 2022-11-05 DIAGNOSIS — E785 Hyperlipidemia, unspecified: Secondary | ICD-10-CM | POA: Diagnosis not present

## 2022-11-05 DIAGNOSIS — R600 Localized edema: Secondary | ICD-10-CM | POA: Diagnosis not present

## 2022-11-05 DIAGNOSIS — N138 Other obstructive and reflux uropathy: Secondary | ICD-10-CM | POA: Diagnosis present

## 2022-11-05 DIAGNOSIS — M25541 Pain in joints of right hand: Secondary | ICD-10-CM | POA: Diagnosis present

## 2022-11-05 DIAGNOSIS — E1165 Type 2 diabetes mellitus with hyperglycemia: Secondary | ICD-10-CM | POA: Diagnosis not present

## 2022-11-05 DIAGNOSIS — M00032 Staphylococcal arthritis, left wrist: Secondary | ICD-10-CM

## 2022-11-05 DIAGNOSIS — I499 Cardiac arrhythmia, unspecified: Secondary | ICD-10-CM | POA: Diagnosis not present

## 2022-11-05 DIAGNOSIS — M4642 Discitis, unspecified, cervical region: Secondary | ICD-10-CM | POA: Diagnosis not present

## 2022-11-05 DIAGNOSIS — R519 Headache, unspecified: Secondary | ICD-10-CM | POA: Diagnosis not present

## 2022-11-05 DIAGNOSIS — Z8249 Family history of ischemic heart disease and other diseases of the circulatory system: Secondary | ICD-10-CM

## 2022-11-05 DIAGNOSIS — Z681 Body mass index (BMI) 19 or less, adult: Secondary | ICD-10-CM

## 2022-11-05 DIAGNOSIS — M19032 Primary osteoarthritis, left wrist: Secondary | ICD-10-CM | POA: Diagnosis not present

## 2022-11-05 DIAGNOSIS — M71161 Other infective bursitis, right knee: Secondary | ICD-10-CM | POA: Diagnosis not present

## 2022-11-05 DIAGNOSIS — W19XXXD Unspecified fall, subsequent encounter: Secondary | ICD-10-CM | POA: Diagnosis not present

## 2022-11-05 DIAGNOSIS — M00061 Staphylococcal arthritis, right knee: Secondary | ICD-10-CM | POA: Diagnosis not present

## 2022-11-05 DIAGNOSIS — M79644 Pain in right finger(s): Secondary | ICD-10-CM | POA: Diagnosis not present

## 2022-11-05 DIAGNOSIS — G9341 Metabolic encephalopathy: Secondary | ICD-10-CM | POA: Diagnosis not present

## 2022-11-05 DIAGNOSIS — Z66 Do not resuscitate: Secondary | ICD-10-CM | POA: Diagnosis not present

## 2022-11-05 DIAGNOSIS — D696 Thrombocytopenia, unspecified: Secondary | ICD-10-CM | POA: Diagnosis present

## 2022-11-05 DIAGNOSIS — M869 Osteomyelitis, unspecified: Secondary | ICD-10-CM | POA: Diagnosis not present

## 2022-11-05 DIAGNOSIS — I10 Essential (primary) hypertension: Secondary | ICD-10-CM | POA: Diagnosis present

## 2022-11-05 DIAGNOSIS — T847XXD Infection and inflammatory reaction due to other internal orthopedic prosthetic devices, implants and grafts, subsequent encounter: Secondary | ICD-10-CM | POA: Diagnosis not present

## 2022-11-05 DIAGNOSIS — M25462 Effusion, left knee: Secondary | ICD-10-CM | POA: Diagnosis not present

## 2022-11-05 DIAGNOSIS — M25461 Effusion, right knee: Secondary | ICD-10-CM | POA: Diagnosis present

## 2022-11-05 DIAGNOSIS — H9193 Unspecified hearing loss, bilateral: Secondary | ICD-10-CM | POA: Diagnosis present

## 2022-11-05 DIAGNOSIS — I340 Nonrheumatic mitral (valve) insufficiency: Secondary | ICD-10-CM | POA: Diagnosis not present

## 2022-11-05 DIAGNOSIS — I6523 Occlusion and stenosis of bilateral carotid arteries: Secondary | ICD-10-CM | POA: Diagnosis not present

## 2022-11-05 DIAGNOSIS — A4102 Sepsis due to Methicillin resistant Staphylococcus aureus: Principal | ICD-10-CM | POA: Diagnosis present

## 2022-11-05 DIAGNOSIS — R739 Hyperglycemia, unspecified: Secondary | ICD-10-CM | POA: Diagnosis not present

## 2022-11-05 DIAGNOSIS — M549 Dorsalgia, unspecified: Secondary | ICD-10-CM | POA: Diagnosis present

## 2022-11-05 DIAGNOSIS — M50221 Other cervical disc displacement at C4-C5 level: Secondary | ICD-10-CM | POA: Diagnosis not present

## 2022-11-05 DIAGNOSIS — B9561 Methicillin susceptible Staphylococcus aureus infection as the cause of diseases classified elsewhere: Secondary | ICD-10-CM | POA: Diagnosis not present

## 2022-11-05 DIAGNOSIS — M25442 Effusion, left hand: Secondary | ICD-10-CM | POA: Diagnosis not present

## 2022-11-05 DIAGNOSIS — R338 Other retention of urine: Secondary | ICD-10-CM | POA: Diagnosis present

## 2022-11-05 DIAGNOSIS — Z8673 Personal history of transient ischemic attack (TIA), and cerebral infarction without residual deficits: Secondary | ICD-10-CM

## 2022-11-05 DIAGNOSIS — S60421A Blister (nonthermal) of left index finger, initial encounter: Secondary | ICD-10-CM | POA: Diagnosis not present

## 2022-11-05 DIAGNOSIS — M79641 Pain in right hand: Secondary | ICD-10-CM | POA: Diagnosis present

## 2022-11-05 DIAGNOSIS — M129 Arthropathy, unspecified: Secondary | ICD-10-CM | POA: Diagnosis not present

## 2022-11-05 DIAGNOSIS — N189 Chronic kidney disease, unspecified: Secondary | ICD-10-CM | POA: Diagnosis not present

## 2022-11-05 HISTORY — DX: Syncope and collapse: R55

## 2022-11-05 LAB — CBC WITH DIFFERENTIAL/PLATELET
Abs Immature Granulocytes: 0.06 10*3/uL (ref 0.00–0.07)
Basophils Absolute: 0 10*3/uL (ref 0.0–0.1)
Basophils Relative: 0 %
Eosinophils Absolute: 0 10*3/uL (ref 0.0–0.5)
Eosinophils Relative: 0 %
HCT: 28.4 % — ABNORMAL LOW (ref 39.0–52.0)
Hemoglobin: 9.3 g/dL — ABNORMAL LOW (ref 13.0–17.0)
Immature Granulocytes: 1 %
Lymphocytes Relative: 3 %
Lymphs Abs: 0.3 10*3/uL — ABNORMAL LOW (ref 0.7–4.0)
MCH: 30.3 pg (ref 26.0–34.0)
MCHC: 32.7 g/dL (ref 30.0–36.0)
MCV: 92.5 fL (ref 80.0–100.0)
Monocytes Absolute: 0.4 10*3/uL (ref 0.1–1.0)
Monocytes Relative: 4 %
Neutro Abs: 8.6 10*3/uL — ABNORMAL HIGH (ref 1.7–7.7)
Neutrophils Relative %: 92 %
Platelets: 146 10*3/uL — ABNORMAL LOW (ref 150–400)
RBC: 3.07 MIL/uL — ABNORMAL LOW (ref 4.22–5.81)
RDW: 13.2 % (ref 11.5–15.5)
WBC: 9.3 10*3/uL (ref 4.0–10.5)
nRBC: 0 % (ref 0.0–0.2)

## 2022-11-05 LAB — COMPREHENSIVE METABOLIC PANEL
ALT: 22 U/L (ref 0–44)
AST: 28 U/L (ref 15–41)
Albumin: 2.2 g/dL — ABNORMAL LOW (ref 3.5–5.0)
Alkaline Phosphatase: 116 U/L (ref 38–126)
Anion gap: 16 — ABNORMAL HIGH (ref 5–15)
BUN: 23 mg/dL (ref 8–23)
CO2: 21 mmol/L — ABNORMAL LOW (ref 22–32)
Calcium: 8 mg/dL — ABNORMAL LOW (ref 8.9–10.3)
Chloride: 97 mmol/L — ABNORMAL LOW (ref 98–111)
Creatinine, Ser: 1.87 mg/dL — ABNORMAL HIGH (ref 0.61–1.24)
GFR, Estimated: 37 mL/min — ABNORMAL LOW (ref >60)
Glucose, Bld: 222 mg/dL — ABNORMAL HIGH (ref 70–99)
Potassium: 2.6 mmol/L — CL (ref 3.5–5.1)
Sodium: 134 mmol/L — ABNORMAL LOW (ref 135–145)
Total Bilirubin: 0.4 mg/dL (ref 0.3–1.2)
Total Protein: 5.7 g/dL — ABNORMAL LOW (ref 6.5–8.1)

## 2022-11-05 LAB — MAGNESIUM: Magnesium: 1.6 mg/dL — ABNORMAL LOW (ref 1.7–2.4)

## 2022-11-05 LAB — TROPONIN I (HIGH SENSITIVITY): Troponin I (High Sensitivity): 64 ng/L — ABNORMAL HIGH (ref <18)

## 2022-11-05 LAB — CK: Total CK: 108 U/L (ref 49–397)

## 2022-11-05 MED ORDER — ACETAMINOPHEN 325 MG PO TABS
650.0000 mg | ORAL_TABLET | Freq: Four times a day (QID) | ORAL | Status: DC | PRN
  Administered 2022-11-06 – 2022-11-13 (×10): 650 mg via ORAL
  Filled 2022-11-05 (×12): qty 2

## 2022-11-05 MED ORDER — MAGNESIUM SULFATE 2 GM/50ML IV SOLN
2.0000 g | Freq: Once | INTRAVENOUS | Status: AC
  Administered 2022-11-05: 2 g via INTRAVENOUS
  Filled 2022-11-05: qty 50

## 2022-11-05 MED ORDER — LACTATED RINGERS IV BOLUS
1000.0000 mL | Freq: Once | INTRAVENOUS | Status: AC
  Administered 2022-11-05: 1000 mL via INTRAVENOUS

## 2022-11-05 MED ORDER — POTASSIUM CHLORIDE 2 MEQ/ML IV SOLN
INTRAVENOUS | Status: AC
  Filled 2022-11-05 (×2): qty 1000

## 2022-11-05 MED ORDER — ENOXAPARIN SODIUM 30 MG/0.3ML IJ SOSY
30.0000 mg | PREFILLED_SYRINGE | Freq: Every day | INTRAMUSCULAR | Status: DC
  Administered 2022-11-06 – 2022-11-09 (×4): 30 mg via SUBCUTANEOUS
  Filled 2022-11-05 (×4): qty 0.3

## 2022-11-05 MED ORDER — POTASSIUM CHLORIDE 10 MEQ/100ML IV SOLN
10.0000 meq | INTRAVENOUS | Status: AC
  Administered 2022-11-05 – 2022-11-06 (×3): 10 meq via INTRAVENOUS
  Filled 2022-11-05 (×4): qty 100

## 2022-11-05 MED ORDER — POTASSIUM CHLORIDE CRYS ER 20 MEQ PO TBCR
20.0000 meq | EXTENDED_RELEASE_TABLET | Freq: Once | ORAL | Status: AC
  Administered 2022-11-05: 20 meq via ORAL
  Filled 2022-11-05: qty 1

## 2022-11-05 MED ORDER — MAGNESIUM GLUCONATE 500 MG PO TABS: 500.0000 mg | ORAL_TABLET | Freq: Once | ORAL | Status: DC

## 2022-11-05 MED ORDER — PROCHLORPERAZINE EDISYLATE 10 MG/2ML IJ SOLN: 5.0000 mg | Freq: Four times a day (QID) | INTRAMUSCULAR | Status: DC | PRN

## 2022-11-05 MED ORDER — MELATONIN 5 MG PO TABS
5.0000 mg | ORAL_TABLET | Freq: Every evening | ORAL | Status: DC | PRN
  Administered 2022-11-08 – 2022-11-13 (×5): 5 mg via ORAL
  Filled 2022-11-05 (×5): qty 1

## 2022-11-05 MED ORDER — POLYETHYLENE GLYCOL 3350 17 G PO PACK
17.0000 g | PACK | Freq: Every day | ORAL | Status: DC | PRN
  Administered 2022-11-12: 17 g via ORAL
  Filled 2022-11-05: qty 1

## 2022-11-05 NOTE — ED Notes (Signed)
ED TO INPATIENT HANDOFF REPORT  ED Nurse Name and Phone #: Molli Hazard [phone]  S Name/Age/Gender [name] 77 y.o. male Room/Bed: 015C/015C  Code Status   Code Status: Full Code  Home/SNF/Other Home Patient oriented to: self and place Is this baseline? No   Triage Complete: Triage complete  Chief Complaint Syncope [R55]  Triage Note Pt BIB [facility] d/t from home mult syncopal episodes with gen weakness and AMS normally A& O x4 today A&O x3 - was caught during LOCs no injuries.  Pt received 500 cc of NS HR 110-150 unknown if AFIB hx.  BP 110/60 upon arrival.   Allergies Allergies  Allergen Reactions   Nubain [Nalbuphine Hcl]     Muscle contraction    Level of Care/Admitting Diagnosis ED Disposition     ED Disposition  Admit   Condition  --   Comment  [facility] [[id number]  Level of Care: Telemetry Cardiac [103]  May admit patient to [facility] or [facility] if equivalent level of care is available:: No  Covid Evaluation: Asymptomatic - no recent exposure (last 10 days) testing not required  Diagnosis: Syncope [206001]  Admitting Physician: Darlin Drop [[id number]]  Attending Physician: Darlin Drop [[id number]]  Certification:: I certify this patient will need inpatient services for at least 2 midnights  Expected Medical Readiness: 11/07/2022          B Medical/Surgery History Past Medical History:  Diagnosis Date   Arthritis    Back   Blood transfusion    as a child   Chronic back pain    COPD (chronic obstructive pulmonary disease) (HCC)    Disturbance of skin sensation 05/22/2018   Essential hypertension 03/05/2007   Qualifier: Diagnosis of   By: Tawanna Cooler MD, Tinnie Gens A      Gastric erosion    Gastritis and gastroduodenitis    Gastrointestinal hemorrhage with melena 11/20/2018   GIB (gastrointestinal bleeding) 07/18/2019   Headache(784.0)    last one 6 months ago   Hemorrhoids, internal    History of duodenal ulcer     History of lumbar fusion 03/03/2022   RE-OPERATIVE DIAGNOSIS:  lumbar stenosis synovial cyst lumbar spondylosis spondylolisthesis lumbar radiculoapthy L4/5   PROCEDURE:  Procedure(s): POSTERIOR LUMBAR FUSION 1 LEVEL with resection of synovial cyst     History of upper gastrointestinal bleeding 03/03/2022   Duodenal ulcer 2021 Dr. [name]   HLD (hyperlipidemia)    Hypertension    Loss of weight    Wt Readings from Last 10 Encounters:  04/05/22  146 lb (66.2 kg)  03/03/22  143 lb 9.6 oz (65.1 kg)  07/14/21  153 lb (69.4 kg)  12/14/20  147 lb 12.8 oz (67 kg)  11/13/19  154 lb (69.9 kg)  09/16/19  146 lb (66.2 kg)  08/06/19  146 lb (66.2 kg)  07/19/19  144 lb 13.5 oz (65.7 kg)  07/17/19  148 lb (67.1 kg)  07/09/19  147 lb 9.6 oz (67 kg)         Neck rigidity    post cervical fusion   Nocturia    PAIN, CHRONIC NEC 10/06/2006   Qualifier: Diagnosis of   By: Tawanna Cooler RN, Alvino Chapel       Pneumonia    Prostate disease    S/P cervical spinal fusion 03/03/2022   With persistent cervical pain and palpable screws Led to disability History of attempt to dig furrow in skull to fix it Last surgery 1992  Past Surgical History:  Procedure Laterality Date   BIOPSY  07/19/2019   Procedure: BIOPSY;  Surgeon: Shellia Cleverly, DO;  Location: [facility];  Service: Gastroenterology;;   CERVICAL FUSION  1992   C2/C 3  four surgeries   COLONOSCOPY     ESOPHAGOGASTRODUODENOSCOPY  07/20/2011   Procedure: ESOPHAGOGASTRODUODENOSCOPY (EGD);  Surgeon: [name], MD;  Location: [facility];  Service: Endoscopy;  Laterality: N/A;   ESOPHAGOGASTRODUODENOSCOPY (EGD) WITH PROPOFOL N/A 07/19/2019   Procedure: ESOPHAGOGASTRODUODENOSCOPY (EGD) WITH PROPOFOL;  Surgeon: Shellia Cleverly, DO;  Location: [facility];  Service: Gastroenterology;  Laterality: N/A;   SAVORY DILATION  07/20/2011   Procedure: SAVORY DILATION;  Surgeon: [name], MD;  Location: [facility];  Service: Endoscopy;  Laterality: N/A;    SPINAL FUSION  05/06/11     A IV Location/Drains/Wounds Patient Lines/Drains/Airways Status     Active Line/Drains/Airways     Name Placement date Placement time Site Days   Peripheral IV 11/05/22 18 G 1" Anterior;Distal;Left;Upper Arm 11/05/22  1919  Arm  less than 1   Peripheral IV 11/05/22 20 G Anterior;Right Forearm 11/05/22  2311  Forearm  less than 1            Intake/Output Last 24 hours No intake or output data in the 24 hours ending 11/05/22 2334  Labs/Imaging Results for orders placed or performed during the hospital encounter of 11/05/22 (from the past 48 hour(s))  CBC with Differential     Status: Abnormal   Collection Time: 11/05/22  8:35 PM  Result Value Ref Range   WBC 9.3 4.0 - 10.5 K/uL   RBC 3.07 (L) 4.22 - 5.81 MIL/uL   Hemoglobin 9.3 (L) 13.0 - 17.0 g/dL   HCT 51.7 (L) 61.6 - 07.3 %   MCV 92.5 80.0 - 100.0 fL   MCH 30.3 26.0 - 34.0 pg   MCHC 32.7 30.0 - 36.0 g/dL   RDW 71.0 62.6 - 94.8 %   Platelets 146 (L) 150 - 400 K/uL   nRBC 0.0 0.0 - 0.2 %   Neutrophils Relative % 92 %   Neutro Abs 8.6 (H) 1.7 - 7.7 K/uL   Lymphocytes Relative 3 %   Lymphs Abs 0.3 (L) 0.7 - 4.0 K/uL   Monocytes Relative 4 %   Monocytes Absolute 0.4 0.1 - 1.0 K/uL   Eosinophils Relative 0 %   Eosinophils Absolute 0.0 0.0 - 0.5 K/uL   Basophils Relative 0 %   Basophils Absolute 0.0 0.0 - 0.1 K/uL   Immature Granulocytes 1 %   Abs Immature Granulocytes 0.06 0.00 - 0.07 K/uL    Comment: Performed at [facility], [address], [place], [place] [address]  Comprehensive metabolic panel     Status: Abnormal   Collection Time: 11/05/22  8:35 PM  Result Value Ref Range   Sodium 134 (L) 135 - 145 mmol/L   Potassium 2.6 (LL) 3.5 - 5.1 mmol/L    Comment: CRITICAL RESULT CALLED TO, READ BACK BY AND VERIFIED WITH M. Cailen Mihalik, RN AT 21:32 10.05.24 JLASIGAN   Chloride 97 (L) 98 - 111 mmol/L   CO2 21 (L) 22 - 32 mmol/L   Glucose, Bld 222 (H) 70 - 99 mg/dL    Comment: Glucose  reference range applies only to samples taken after fasting for at least 8 hours.   BUN 23 8 - 23 mg/dL   Creatinine, Ser 0.35 (H) 0.61 - 1.24 mg/dL   Calcium 8.0 (L) 8.9 -  10.3 mg/dL   Total Protein 5.7 (L) 6.5 - 8.1 g/dL   Albumin 2.2 (L) 3.5 - 5.0 g/dL   AST 28 15 - 41 U/L   ALT 22 0 - 44 U/L   Alkaline Phosphatase 116 38 - 126 U/L   Total Bilirubin 0.4 0.3 - 1.2 mg/dL   GFR, Estimated 37 (L) >60 mL/min    Comment: (NOTE) Calculated using the CKD-EPI Creatinine Equation (2021)    Anion gap 16 (H) 5 - 15    Comment: Performed at [facility], [address] [place], [place] [address]  Magnesium     Status: Abnormal   Collection Time: 11/05/22  8:35 PM  Result Value Ref Range   Magnesium 1.6 (L) 1.7 - 2.4 mg/dL    Comment: Performed at [facility], [address], [place], [place] [address]  Troponin I (High Sensitivity)     Status: Abnormal   Collection Time: 11/05/22  8:35 PM  Result Value Ref Range   Troponin I (High Sensitivity) 64 (H) <18 ng/L    Comment: (NOTE) Elevated high sensitivity troponin I (hsTnI) values and significant  changes across serial measurements may suggest ACS but many other  chronic and acute conditions are known to elevate hsTnI results.  Refer to the "Links" section for chest pain algorithms and additional  guidance. Performed at [facility], [address], [place], [place] [address]   CK     Status: None   Collection Time: 11/05/22  8:35 PM  Result Value Ref Range   Total CK 108 49 - 397 U/L    Comment: Performed at [facility], [address] [place], [place] [address]   CT Head Wo Contrast  Result Date: 11/05/2022 CLINICAL DATA:  Altered mental status EXAM: CT HEAD WITHOUT CONTRAST TECHNIQUE: Contiguous axial images were obtained from the base of the skull through the vertex without intravenous contrast. RADIATION DOSE REDUCTION: This exam was performed according to the departmental dose-optimization program which  includes automated exposure control, adjustment of the mA and/or kV according to patient size and/or use of iterative reconstruction technique. COMPARISON:  10/28/2022 FINDINGS: Brain: There is no mass, hemorrhage or extra-axial collection. There is generalized atrophy without lobar predilection. Hypodensity of the white matter is most commonly associated with chronic microvascular disease. Old left basal ganglia small vessel infarct. Vascular: Atherosclerotic calcification of the internal carotid arteries at the skull base. No abnormal hyperdensity of the major intracranial arteries or dural venous sinuses. Skull: The visualized skull base, calvarium and extracranial soft tissues are normal. Sinuses/Orbits: Opacification of the right sphenoid sinus with chronic osseous thickening. The other paranasal sinuses are clear. No mastoid or middle ear effusion. Normal orbits. IMPRESSION: 1. No acute intracranial abnormality. 2. Generalized atrophy and findings of chronic microvascular disease. 3. Old left basal ganglia small vessel infarct. 4. Chronic right sphenoid sinusitis. Electronically Signed   By: Deatra Robinson M.D.   On: 11/05/2022 21:36   DG Chest Portable 1 View  Result Date: 11/05/2022 CLINICAL DATA:  Altered mental status and multiple syncopal episodes with generalized weakness. EXAM: PORTABLE CHEST 1 VIEW COMPARISON:  PA Lat chest 10/27/2022 FINDINGS: There is a tangle of overlying monitor wiring. The cardiomediastinal silhouette and vascular pattern are normal. The mediastinum is stable with moderate aortic calcific plaques. The lungs are mildly emphysematous but clear. Thoracic cage is intact. IMPRESSION: No evidence of acute chest disease. Stable COPD chest with aortic atherosclerosis. Electronically Signed  By: Almira Bar M.D.   On: 11/05/2022 21:08    Pending Labs Unresulted Labs (From admission, onward)     Start     Ordered   11/12/22 0500  Creatinine, serum  (enoxaparin (LOVENOX)    CrCl  >/= 30 ml/min)  Weekly,   R     Comments: while on enoxaparin therapy    11/05/22 2258            Vitals/Pain Today's Vitals   11/05/22 2215 11/05/22 2300 11/05/22 2308 11/05/22 2315  BP: 128/66 122/62  128/63  Pulse: 91 94  95  Resp: 18 (!) 23  (!) 23  Temp:   98.4 F (36.9 C)   TempSrc:   Oral   SpO2: 95% 95%  94%  Weight:      Height:      PainSc:        Isolation Precautions No active isolations  Medications Medications  potassium chloride 10 mEq in 100 mL IVPB (10 mEq Intravenous New Bag/Given 11/05/22 2301)  magnesium sulfate IVPB 2 g 50 mL (2 g Intravenous New Bag/Given 11/05/22 2304)  enoxaparin (LOVENOX) injection 30 mg (has no administration in time range)  acetaminophen (TYLENOL) tablet 650 mg (has no administration in time range)  prochlorperazine (COMPAZINE) injection 5 mg (has no administration in time range)  melatonin tablet 5 mg (has no administration in time range)  polyethylene glycol (MIRALAX / GLYCOLAX) packet 17 g (has no administration in time range)  lactated ringers 1,000 mL with potassium chloride 20 mEq infusion (has no administration in time range)  lactated ringers bolus 1,000 mL (0 mLs Intravenous Stopped 11/05/22 2308)  potassium chloride SA (KLOR-CON M) CR tablet 20 mEq (20 mEq Oral Given 11/05/22 2304)    Mobility walks     Focused Assessments Cardiac Assessment Handoff:  Cardiac Rhythm: Sinus tachycardia Lab Results  Component Value Date   CKTOTAL 108 11/05/2022   Lab Results  Component Value Date   DDIMER 0.63 (H) 11/16/2018   Does the Patient currently have chest pain?   , Neuro Assessment Handoff:  Swallow screen pass?  Cardiac Rhythm: Sinus tachycardia       Neuro Assessment: Exceptions to WDL Neuro Checks:      Has TPA been given?  If patient is a Neuro Trauma and patient is going to OR before floor call report to 4N Charge nurse: [phone] or [phone]  , Renal Assessment Handoff:  Hemodialysis  Schedule:  Last Hemodialysis date and time:   Restricted appendage:   , Pulmonary Assessment Handoff:  Lung sounds:   O2 Device: Room Air      R Recommendations: See Admitting Provider Note  Report given to:   Additional Notes: resting comfortably has been communicating fine with nursing staff.  Vitals are fine, but potassium low.

## 2022-11-05 NOTE — ED Notes (Signed)
ED TO INPATIENT HANDOFF REPORT  ED Nurse Name and Phone #: Morrie Sheldon RN [phone]  S Name/Age/Gender Michael Bender 77 y.o. male Room/Bed: 015C/015C  Code Status   Code Status: Full Code  Home/SNF/Other Home Patient oriented to: self, place, and time Is this baseline? No   Triage Complete: Triage complete  Chief Complaint Syncope [R55]  Triage Note Pt BIB [facility] d/t from home mult syncopal episodes with gen weakness and AMS normally A& O x4 today A&O x3 - was caught during LOCs no injuries.  Pt received 500 cc of NS HR 110-150 unknown if AFIB hx.  BP 110/60 upon arrival.   Allergies Allergies  Allergen Reactions   Nubain [Nalbuphine Hcl]     Muscle contraction    Level of Care/Admitting Diagnosis ED Disposition     ED Disposition  Admit   Condition  --   Comment  [facility] [[id number]]  Level of Care: Telemetry Cardiac [103]  [facility] if equivalent level of care is available:: No  Covid Evaluation: Asymptomatic - no recent exposure (last 10 days) testing not required  Diagnosis: Syncope [206001]  Admitting Physician: Darlin Drop [[id number]]  Attending Physician: Darlin Drop [[id number]]  Certification:: I certify this patient will need inpatient services for at least 2 midnights  Expected Medical Readiness: 11/07/2022          B Medical/Surgery History Past Medical History:  Diagnosis Date   Arthritis    Back   Blood transfusion    as a child   Chronic back pain    COPD (chronic obstructive pulmonary disease) (HCC)    Disturbance of skin sensation 05/22/2018   Essential hypertension 03/05/2007   Qualifier: Diagnosis of   By: Tawanna Cooler MD, Tinnie Gens A      Gastric erosion    Gastritis and gastroduodenitis    Gastrointestinal hemorrhage with melena 11/20/2018   GIB (gastrointestinal bleeding) 07/18/2019   Headache(784.0)    last one 6 months ago   Hemorrhoids, internal    History of  duodenal ulcer    History of lumbar fusion 03/03/2022   RE-OPERATIVE DIAGNOSIS:  lumbar stenosis synovial cyst lumbar spondylosis spondylolisthesis lumbar radiculoapthy L4/5   PROCEDURE:  Procedure(s): POSTERIOR LUMBAR FUSION 1 LEVEL with resection of synovial cyst     History of upper gastrointestinal bleeding 03/03/2022   Duodenal ulcer 2021 Dr. [name]   HLD (hyperlipidemia)    Hypertension    Loss of weight    Wt Readings from Last 10 Encounters:  04/05/22  146 lb (66.2 kg)  03/03/22  143 lb 9.6 oz (65.1 kg)  07/14/21  153 lb (69.4 kg)  12/14/20  147 lb 12.8 oz (67 kg)  11/13/19  154 lb (69.9 kg)  09/16/19  146 lb (66.2 kg)  08/06/19  146 lb (66.2 kg)  07/19/19  144 lb 13.5 oz (65.7 kg)  07/17/19  148 lb (67.1 kg)  07/09/19  147 lb 9.6 oz (67 kg)         Neck rigidity    post cervical fusion   Nocturia    PAIN, CHRONIC NEC 10/06/2006   Qualifier: Diagnosis of   By: Tawanna Cooler RN, Alvino Chapel       Pneumonia    Prostate disease    S/P cervical spinal fusion 03/03/2022   With persistent cervical pain and palpable screws Led to disability History of attempt to dig furrow in skull to fix it Last surgery 1992  Past Surgical History:  Procedure Laterality Date   BIOPSY  07/19/2019   Procedure: BIOPSY;  Surgeon: Shellia Cleverly, DO;  Location: [facility];  Service: Gastroenterology;;   CERVICAL FUSION  1992   C2/C 3  four surgeries   COLONOSCOPY     ESOPHAGOGASTRODUODENOSCOPY  07/20/2011   Procedure: ESOPHAGOGASTRODUODENOSCOPY (EGD);  Surgeon: [name], MD;  Location: [facility];  Service: Endoscopy;  Laterality: N/A;   ESOPHAGOGASTRODUODENOSCOPY (EGD) WITH PROPOFOL N/A 07/19/2019   Procedure: ESOPHAGOGASTRODUODENOSCOPY (EGD) WITH PROPOFOL;  Surgeon: Shellia Cleverly, DO;  Location: [facility];  Service: Gastroenterology;  Laterality: N/A;   SAVORY DILATION  07/20/2011   Procedure: SAVORY DILATION;  Surgeon: [name], MD;  Location: [facility];  Service: Endoscopy;   Laterality: N/A;   SPINAL FUSION  05/06/11     A IV Location/Drains/Wounds Patient Lines/Drains/Airways Status     Active Line/Drains/Airways     Name Placement date Placement time Site Days   Peripheral IV 11/05/22 18 G 1" Anterior;Distal;Left;Upper Arm 11/05/22  1919  Arm  less than 1   Peripheral IV 11/05/22 20 G Anterior;Right Forearm 11/05/22  2311  Forearm  less than 1            Intake/Output Last 24 hours No intake or output data in the 24 hours ending 11/05/22 2314  Labs/Imaging Results for orders placed or performed during the hospital encounter of 11/05/22 (from the past 48 hour(s))  CBC with Differential     Status: Abnormal   Collection Time: 11/05/22  8:35 PM  Result Value Ref Range   WBC 9.3 4.0 - 10.5 K/uL   RBC 3.07 (L) 4.22 - 5.81 MIL/uL   Hemoglobin 9.3 (L) 13.0 - 17.0 g/dL   HCT 16.1 (L) 09.6 - 04.5 %   MCV 92.5 80.0 - 100.0 fL   MCH 30.3 26.0 - 34.0 pg   MCHC 32.7 30.0 - 36.0 g/dL   RDW 40.9 81.1 - 91.4 %   Platelets 146 (L) 150 - 400 K/uL   nRBC 0.0 0.0 - 0.2 %   Neutrophils Relative % 92 %   Neutro Abs 8.6 (H) 1.7 - 7.7 K/uL   Lymphocytes Relative 3 %   Lymphs Abs 0.3 (L) 0.7 - 4.0 K/uL   Monocytes Relative 4 %   Monocytes Absolute 0.4 0.1 - 1.0 K/uL   Eosinophils Relative 0 %   Eosinophils Absolute 0.0 0.0 - 0.5 K/uL   Basophils Relative 0 %   Basophils Absolute 0.0 0.0 - 0.1 K/uL   Immature Granulocytes 1 %   Abs Immature Granulocytes 0.06 0.00 - 0.07 K/uL    Comment: Performed at [facility], [address] [place], [place] [address]  Comprehensive metabolic panel     Status: Abnormal   Collection Time: 11/05/22  8:35 PM  Result Value Ref Range   Sodium 134 (L) 135 - 145 mmol/L   Potassium 2.6 (LL) 3.5 - 5.1 mmol/L    Comment: CRITICAL RESULT CALLED TO, READ BACK BY AND VERIFIED WITH M. ROBERTS, RN AT 21:32 10.05.24 JLASIGAN   Chloride 97 (L) 98 - 111 mmol/L   CO2 21 (L) 22 - 32 mmol/L   Glucose, Bld 222 (H) 70 - 99 mg/dL     Comment: Glucose reference range applies only to samples taken after fasting for at least 8 hours.   BUN 23 8 - 23 mg/dL   Creatinine, Ser 6.21 (H) 0.61 - 1.24 mg/dL   Calcium 8.0 (L) 8.9 -  10.3 mg/dL   Total Protein 5.7 (L) 6.5 - 8.1 g/dL   Albumin 2.2 (L) 3.5 - 5.0 g/dL   AST 28 15 - 41 U/L   ALT 22 0 - 44 U/L   Alkaline Phosphatase 116 38 - 126 U/L   Total Bilirubin 0.4 0.3 - 1.2 mg/dL   GFR, Estimated 37 (L) >60 mL/min    Comment: (NOTE) Calculated using the CKD-EPI Creatinine Equation (2021)    Anion gap 16 (H) 5 - 15    Comment: Performed at [facility], [address], [place], [place] [address]  Magnesium     Status: Abnormal   Collection Time: 11/05/22  8:35 PM  Result Value Ref Range   Magnesium 1.6 (L) 1.7 - 2.4 mg/dL    Comment: Performed at [facility], [address], [place], [place] [address]  Troponin I (High Sensitivity)     Status: Abnormal   Collection Time: 11/05/22  8:35 PM  Result Value Ref Range   Troponin I (High Sensitivity) 64 (H) <18 ng/L    Comment: (NOTE) Elevated high sensitivity troponin I (hsTnI) values and significant  changes across serial measurements may suggest ACS but many other  chronic and acute conditions are known to elevate hsTnI results.  Refer to the "Links" section for chest pain algorithms and additional  guidance. Performed at [facility], [address] [place], [place] [address]   CK     Status: None   Collection Time: 11/05/22  8:35 PM  Result Value Ref Range   Total CK 108 49 - 397 U/L    Comment: Performed at [facility], [address] [place], [place] [address]   CT Head Wo Contrast  Result Date: 11/05/2022 CLINICAL DATA:  Altered mental status EXAM: CT HEAD WITHOUT CONTRAST TECHNIQUE: Contiguous axial images were obtained from the base of the skull through the vertex without intravenous contrast. RADIATION DOSE REDUCTION: This exam was performed according to the departmental  dose-optimization program which includes automated exposure control, adjustment of the mA and/or kV according to patient size and/or use of iterative reconstruction technique. COMPARISON:  10/28/2022 FINDINGS: Brain: There is no mass, hemorrhage or extra-axial collection. There is generalized atrophy without lobar predilection. Hypodensity of the white matter is most commonly associated with chronic microvascular disease. Old left basal ganglia small vessel infarct. Vascular: Atherosclerotic calcification of the internal carotid arteries at the skull base. No abnormal hyperdensity of the major intracranial arteries or dural venous sinuses. Skull: The visualized skull base, calvarium and extracranial soft tissues are normal. Sinuses/Orbits: Opacification of the right sphenoid sinus with chronic osseous thickening. The other paranasal sinuses are clear. No mastoid or middle ear effusion. Normal orbits. IMPRESSION: 1. No acute intracranial abnormality. 2. Generalized atrophy and findings of chronic microvascular disease. 3. Old left basal ganglia small vessel infarct. 4. Chronic right sphenoid sinusitis. Electronically Signed   By: Deatra Robinson M.D.   On: 11/05/2022 21:36   DG Chest Portable 1 View  Result Date: 11/05/2022 CLINICAL DATA:  Altered mental status and multiple syncopal episodes with generalized weakness. EXAM: PORTABLE CHEST 1 VIEW COMPARISON:  PA Lat chest 10/27/2022 FINDINGS: There is a tangle of overlying monitor wiring. The cardiomediastinal silhouette and vascular pattern are normal. The mediastinum is stable with moderate aortic calcific plaques. The lungs are mildly emphysematous but clear. Thoracic cage is intact. IMPRESSION: No evidence of acute chest disease. Stable COPD chest with aortic atherosclerosis. Electronically Signed  By: Almira Bar M.D.   On: 11/05/2022 21:08    Pending Labs Unresulted Labs (From admission, onward)     Start     Ordered   11/12/22 0500  Creatinine,  serum  (enoxaparin (LOVENOX)    CrCl >/= 30 ml/min)  Weekly,   R     Comments: while on enoxaparin therapy    11/05/22 2258            Vitals/Pain Today's Vitals   11/05/22 2130 11/05/22 2215 11/05/22 2300 11/05/22 2308  BP: 121/64 128/66 122/62   Pulse: 92 91 94   Resp: 19 18 (!) 23   Temp:    98.4 F (36.9 C)  TempSrc:    Oral  SpO2: 97% 95% 95%   Weight:      Height:      PainSc:        Isolation Precautions No active isolations  Medications Medications  potassium chloride 10 mEq in 100 mL IVPB (10 mEq Intravenous New Bag/Given 11/05/22 2301)  magnesium sulfate IVPB 2 g 50 mL (2 g Intravenous New Bag/Given 11/05/22 2304)  enoxaparin (LOVENOX) injection 30 mg (has no administration in time range)  acetaminophen (TYLENOL) tablet 650 mg (has no administration in time range)  prochlorperazine (COMPAZINE) injection 5 mg (has no administration in time range)  melatonin tablet 5 mg (has no administration in time range)  polyethylene glycol (MIRALAX / GLYCOLAX) packet 17 g (has no administration in time range)  lactated ringers 1,000 mL with potassium chloride 20 mEq infusion (has no administration in time range)  lactated ringers bolus 1,000 mL (0 mLs Intravenous Stopped 11/05/22 2308)  potassium chloride SA (KLOR-CON M) CR tablet 20 mEq (20 mEq Oral Given 11/05/22 2304)    Mobility walks with device and person assist     Focused Assessments Neuro Assessment Handoff:  Swallow screen pass? Yes  Cardiac Rhythm: Sinus tachycardia       Neuro Assessment:   Neuro Checks:      Has TPA been given? No If patient is a Neuro Trauma and patient is going to OR before floor call report to 4N Charge nurse: [phone] or [phone]   R Recommendations: See Admitting Provider Note  Report given to:   Additional Notes:

## 2022-11-05 NOTE — H&P (Incomplete)
History and Physical  Michael Bender HYQ:[id number] DOB: 11/08/1945 DOA: 11/05/2022  Referring physician: Dr. Ledon Snare, EDP  PCP: Lula Olszewski, MD  Outpatient Specialists: GI. Patient coming from: Home.  Chief Complaint: Loss of consciousness  HPI: Michael Bender is a 77 y.o. male with medical history significant for peripheral vascular disease, hypertension, hyperlipidemia, history of orthostatic hypotension, GERD, recently admitted to the hospital from 10/27/2022 through 11/02/22 due to multiple falls, traumatic rhabdomyolysis, AKI, acute urinary retention with bladder outlet obstruction, BPH, was offered SNF and home health PT but declined, who presents from home with recurrent episodes of syncope, witnessed by family members.  Associated with generalized weakness and right index finger swelling and pain.  In the ED, bruising noted in upper extremities from multiple falls. Atrial fibrillation on 12-lead EKG with no prior history of arrhythmia.  Lab studies remarkable for electrolytes abnormalities.  EDP requesting admission for syncope and new onset atrial fibrillation.    Additionally, the patient became febrile with Tmax 100.9.  Right index finger with concern for cellulitis.  Admitted by [facility], hospitalist service.  Started on Rocephin empirically for cellulitis.  Ongoing workup for syncope.  ED Course: Tmax 100.9.  BP 133/66, pulse 95, respiratory 15, saturation 97% on room air.  WBC 9.3.  Hemoglobin 9.3.  MCV 92.5.  Platelet count 146.  Serum sodium 134, potassium 2.6, serum bicarb 21, glucose 222.  Creatinine 1.87 anion gap 16, magnesium 1.6, albumin 2.2, GFR 37.  Troponin 64, 67.  Review of Systems: Review of systems as noted in the HPI. All other systems reviewed and are negative.   Past Medical History:  Diagnosis Date   Arthritis    Back   Blood transfusion    as a child   Chronic back pain    COPD (chronic obstructive pulmonary disease) (HCC)    Disturbance of skin  sensation 05/22/2018   Essential hypertension 03/05/2007   Qualifier: Diagnosis of   By: Tawanna Cooler MD, Eugenio Hoes      Gastric erosion    Gastritis and gastroduodenitis    Gastrointestinal hemorrhage with melena 11/20/2018   GIB (gastrointestinal bleeding) 07/18/2019   Headache(784.0)    last one 6 months ago   Hemorrhoids, internal    History of duodenal ulcer    History of lumbar fusion 03/03/2022   RE-OPERATIVE DIAGNOSIS:  lumbar stenosis synovial cyst lumbar spondylosis spondylolisthesis lumbar radiculoapthy L4/5   PROCEDURE:  Procedure(s): POSTERIOR LUMBAR FUSION 1 LEVEL with resection of synovial cyst     History of upper gastrointestinal bleeding 03/03/2022   Duodenal ulcer 2021 Dr. [name]   HLD (hyperlipidemia)    Hypertension    Loss of weight    Wt Readings from Last 10 Encounters:  04/05/22  146 lb (66.2 kg)  03/03/22  143 lb 9.6 oz (65.1 kg)  07/14/21  153 lb (69.4 kg)  12/14/20  147 lb 12.8 oz (67 kg)  11/13/19  154 lb (69.9 kg)  09/16/19  146 lb (66.2 kg)  08/06/19  146 lb (66.2 kg)  07/19/19  144 lb 13.5 oz (65.7 kg)  07/17/19  148 lb (67.1 kg)  07/09/19  147 lb 9.6 oz (67 kg)         Neck rigidity    post cervical fusion   Nocturia    PAIN, CHRONIC NEC 10/06/2006   Qualifier: Diagnosis of   By: Tawanna Cooler RN, Alvino Chapel       Pneumonia    Prostate disease    S/P cervical spinal  fusion 03/03/2022   With persistent cervical pain and palpable screws Led to disability History of attempt to dig furrow in skull to fix it Last surgery 1992   Past Surgical History:  Procedure Laterality Date   BIOPSY  07/19/2019   Procedure: BIOPSY;  Surgeon: Shellia Cleverly, DO;  Location: [facility];  Service: Gastroenterology;;   CERVICAL FUSION  1992   C2/C 3  four surgeries   COLONOSCOPY     ESOPHAGOGASTRODUODENOSCOPY  07/20/2011   Procedure: ESOPHAGOGASTRODUODENOSCOPY (EGD);  Surgeon: [name], MD;  Location: [facility];  Service: Endoscopy;  Laterality: N/A;    ESOPHAGOGASTRODUODENOSCOPY (EGD) WITH PROPOFOL N/A 07/19/2019   Procedure: ESOPHAGOGASTRODUODENOSCOPY (EGD) WITH PROPOFOL;  Surgeon: Shellia Cleverly, DO;  Location: [facility];  Service: Gastroenterology;  Laterality: N/A;   SAVORY DILATION  07/20/2011   Procedure: SAVORY DILATION;  Surgeon: [name], MD;  Location: [facility];  Service: Endoscopy;  Laterality: N/A;   SPINAL FUSION  05/06/11    Social History:  reports that he has been smoking cigarettes. He started smoking about 43 years ago. He has never used smokeless tobacco. He reports that he does not currently use alcohol after a past usage of about 2.0 standard drinks of alcohol per week. He reports that he does not use drugs.   Allergies  Allergen Reactions   Nubain [Nalbuphine Hcl]     Muscle contraction    Family History  Problem Relation Age of Onset   Heart disease Mother    Heart attack Mother 60   Pancreatic cancer Father 31   Pancreatic cancer Brother    Anesthesia problems Neg Hx    Colon cancer Neg Hx    Liver cancer Neg Hx    Stomach cancer Neg Hx    Esophageal cancer Neg Hx    Rectal cancer Neg Hx       Prior to Admission medications   Medication Sig Start Date End Date Taking? Authorizing Provider  amLODipine (NORVASC) 10 MG tablet Take 1 tablet (10 mg total) by mouth daily. 11/03/22   Glade Lloyd, MD  cyclobenzaprine (FLEXERIL) 10 MG tablet Take 10 mg by mouth 3 (three) times daily as needed. 10/18/22   [provider]  Oxycodone HCl 10 MG TABS Take 1 tablet by mouth every 8 (eight) hours as needed.    [provider]  pantoprazole (PROTONIX) 40 MG tablet TAKE 1 TABLET BY MOUTH EVERY DAY BEFORE BREAKFAST Patient taking differently: Take 40 mg by mouth daily. 10/20/22   Lula Olszewski, MD  polyethylene glycol (MIRALAX / GLYCOLAX) 17 g packet Take 17 g by mouth daily as needed for moderate constipation. 11/02/22   Glade Lloyd, MD  potassium chloride SA (KLOR-CON M) 20 MEQ  tablet Take 1 tablet (20 mEq total) by mouth 2 (two) times daily. 11/02/22   Glade Lloyd, MD  SUMAtriptan (IMITREX) 50 MG tablet Take 50 mg by mouth every 2 (two) hours as needed for migraine or headache. 10/19/22   [provider]  tamsulosin (FLOMAX) 0.4 MG CAPS capsule Take 1 capsule (0.4 mg total) by mouth daily after supper. 11/02/22   Glade Lloyd, MD    Physical Exam: BP 122/62   Pulse 94   Temp 98.4 F (36.9 C) (Oral)   Resp (!) 23   Ht 5\' 11"  (1.803 m)   Wt 61.2 kg   SpO2 95%   BMI 18.82 kg/m   General: 77 y.o. year-old male frail-appearing in no acute distress.  Alert  and oriented x3. Cardiovascular: Irregular rate and rhythm with no rubs or gallops.  No thyromegaly or JVD noted.  No lower extremity edema. 2/4 pulses in all 4 extremities. Respiratory: Clear to auscultation with no wheezes or rales. Good inspiratory effort. Abdomen: Soft nontender nondistended with normal bowel sounds x4 quadrants. Muskuloskeletal: No cyanosis, clubbing or edema noted bilaterally Neuro: CN II-XII intact, strength, sensation, reflexes Skin: Right index finger, edematous, tender, warm with mild erythema. Psychiatry: Judgement and insight appear normal. Mood is appropriate for condition and setting          Labs on Admission:  Basic Metabolic Panel: Recent Labs  Lab 10/30/22 0851 10/31/22 0405 11/01/22 0418 11/01/22 1137 11/01/22 1811 11/02/22 0306 11/05/22 2035  NA 143 140 139  --   --  138 134*  K 3.6 3.6 2.6* 2.7* 3.6 3.1* 2.6*  CL 101 104 98  --   --  104 97*  CO2 27 28 28   --   --  25 21*  GLUCOSE 183* 124* 128*  --   --  195* 222*  BUN 17 13 11   --   --  9 23  CREATININE 2.28* 2.13* 1.65*  --   --  1.61* 1.87*  CALCIUM 8.2* 8.1* 8.0*  --   --  7.9* 8.0*  MG  --   --  1.3* 2.4  --   --  1.6*   Liver Function Tests: Recent Labs  Lab 10/31/22 0405 11/01/22 0418 11/02/22 0306 11/05/22 2035  AST 114* 146* 84* 28  ALT 37 43 37 22  ALKPHOS 130* 121 117 116   BILITOT 0.8 0.2* 0.3 0.4  PROT 5.8* 5.6* 5.7* 5.7*  ALBUMIN 2.5* 2.4* 2.3* 2.2*   No results for input(s): "LIPASE", "AMYLASE" in the last 168 hours. No results for input(s): "AMMONIA" in the last 168 hours. CBC: Recent Labs  Lab 10/31/22 0405 11/05/22 2035  WBC 4.5 9.3  NEUTROABS  --  8.6*  HGB 11.3* 9.3*  HCT 32.5* 28.4*  MCV 92.1 92.5  PLT 144* 146*   Cardiac Enzymes: Recent Labs  Lab 10/30/22 0851 10/31/22 0402 11/01/22 0418 11/05/22 2035  CKTOTAL 802* 818* 482* 108    BNP (last 3 results) No results for input(s): "BNP" in the last 8760 hours.  ProBNP (last 3 results) No results for input(s): "PROBNP" in the last 8760 hours.  CBG: No results for input(s): "GLUCAP" in the last 168 hours.  Radiological Exams on Admission: CT Head Wo Contrast  Result Date: 11/05/2022 CLINICAL DATA:  Altered mental status EXAM: CT HEAD WITHOUT CONTRAST TECHNIQUE: Contiguous axial images were obtained from the base of the skull through the vertex without intravenous contrast. RADIATION DOSE REDUCTION: This exam was performed according to the departmental dose-optimization program which includes automated exposure control, adjustment of the mA and/or kV according to patient size and/or use of iterative reconstruction technique. COMPARISON:  10/28/2022 FINDINGS: Brain: There is no mass, hemorrhage or extra-axial collection. There is generalized atrophy without lobar predilection. Hypodensity of the white matter is most commonly associated with chronic microvascular disease. Old left basal ganglia small vessel infarct. Vascular: Atherosclerotic calcification of the internal carotid arteries at the skull base. No abnormal hyperdensity of the major intracranial arteries or dural venous sinuses. Skull: The visualized skull base, calvarium and extracranial soft tissues are normal. Sinuses/Orbits: Opacification of the right sphenoid sinus with chronic osseous thickening. The other paranasal sinuses  are clear. No mastoid or middle ear effusion. Normal orbits. IMPRESSION: 1. No  acute intracranial abnormality. 2. Generalized atrophy and findings of chronic microvascular disease. 3. Old left basal ganglia small vessel infarct. 4. Chronic right sphenoid sinusitis. Electronically Signed   By: Deatra Robinson M.D.   On: 11/05/2022 21:36   DG Chest Portable 1 View  Result Date: 11/05/2022 CLINICAL DATA:  Altered mental status and multiple syncopal episodes with generalized weakness. EXAM: PORTABLE CHEST 1 VIEW COMPARISON:  PA Lat chest 10/27/2022 FINDINGS: There is a tangle of overlying monitor wiring. The cardiomediastinal silhouette and vascular pattern are normal. The mediastinum is stable with moderate aortic calcific plaques. The lungs are mildly emphysematous but clear. Thoracic cage is intact. IMPRESSION: No evidence of acute chest disease. Stable COPD chest with aortic atherosclerosis. Electronically Signed   By: Almira Bar M.D.   On: 11/05/2022 21:08    EKG: I independently viewed the EKG done and my findings are as followed: Atrial fibrillation rate of 121.  Nonspecific ST-T changes.  QTc 474  Assessment/Plan Present on Admission:  Syncope  Principal Problem:   Syncope  Syncope, unclear etiology Obtain orthostatic vital signs Obtain 2D echo to rule out any cardiac structural abnormalities PT OT assessment Fall precautions. Gentle IV fluid hydration  Elevated troponin, suspect demand ischemia New onset atrial fibrillation with RVR. Electrolyte abnormalities that are being repleted. Initial troponin 64, repeat 67 No evidence of acute ischemia on 12 EKG Trend troponin until it reaches its peak. Monitor on telemetry.  Newly diagnosed atrial fibrillation with RVR Admission 12-lead EKG with A-fib rate of 121 CHA2DS2-VASc score of 4. Follow 2D echo Consider cardiology consultation in the morning Unclear if a candidate for anticoagulation with multiple  falls.  Hypokalemia Serum potassium 2.6 Repleted intravenously and orally Repeat BMP and magnesium level in the morning.  Hypomagnesemia Serum magnesium 1.6 Repleted intravenously. Repeat level in the morning.  AKI, suspect prerenal in setting of dehydration from poor oral intake Serum creatinine appears to be 1.0 GFR greater than 60 Presented with creatinine of 1.87 with GFR of 37 Continue to monitor for toxic agents, dehydration and hypotension Monitor urine output  Physical debility PT OT assessment Fall precautions.  Suspected R index finger cellulitis Covered with Rocephin 2g daily Blood cultures x 2 obtained.   Time: 75 minutes.   DVT prophylaxis: Subcu Lovenox daily  Code Status: Full code  Family Communication: None at bedside  Disposition Plan: Admitted to telemetry cardiac unit  Consults called: None.  Admission status: Inpatient status.   Status is: Inpatient The patient requires at least 2 midnights for further evaluation and treatment of present condition.   Darlin Drop MD [facility] Pager [phone]  If 7PM-7AM, please contact night-coverage [email or web address] Password [email or web address]  11/05/2022, 11:13 PM

## 2022-11-05 NOTE — ED Triage Notes (Signed)
Pt BIB [facility] d/t from home mult syncopal episodes with gen weakness and AMS normally A& O x4 today A&O x3 - was caught during LOCs no injuries.  Pt received 500 cc of NS HR 110-150 unknown if AFIB hx.  BP 110/60 upon arrival.

## 2022-11-05 NOTE — ED Notes (Signed)
Labs sent

## 2022-11-05 NOTE — ED Provider Notes (Signed)
[place] EMERGENCY DEPARTMENT AT [facility] Provider Note   CSN: [id number] Arrival date & time: 11/05/22  1943     History  Chief Complaint  Patient presents with   Loss of Consciousness    Michael Bender is a 77 y.o. male.   Loss of Consciousness  77 year old male past medical history of COPD, hypertension, hyperlipidemia, prior CVA, PAD presenting for evaluation of syncopal episodes.  Patient has reportedly had upwards of 4 syncopal events within the last 2 days.  Family has seen each of these events as they start to happen and have caught him before he actually falls.  No head trauma.  When asked why he came to the hospital, patient tells me that he does not know.  He says that he feels fine and has no idea why EMS was called to his house.  He does tell me that he lives alone with his dog.  Currently denying any headaches, chest pain, shortness of breath, abdominal pain, extremity pain, numbness, tingling, weakness.  He does comment upon mild blurred vision in his right eye as well as drainage from the site.    Home Medications Prior to Admission medications   Medication Sig Start Date End Date Taking? Authorizing Provider  amLODipine (NORVASC) 10 MG tablet Take 1 tablet (10 mg total) by mouth daily. 11/03/22   Glade Lloyd, MD  cyclobenzaprine (FLEXERIL) 10 MG tablet Take 10 mg by mouth 3 (three) times daily as needed. 10/18/22   [provider]  Oxycodone HCl 10 MG TABS Take 1 tablet by mouth every 8 (eight) hours as needed.    [provider]  pantoprazole (PROTONIX) 40 MG tablet TAKE 1 TABLET BY MOUTH EVERY DAY BEFORE BREAKFAST Patient taking differently: Take 40 mg by mouth daily. 10/20/22   Lula Olszewski, MD  polyethylene glycol (MIRALAX / GLYCOLAX) 17 g packet Take 17 g by mouth daily as needed for moderate constipation. 11/02/22   Glade Lloyd, MD  potassium chloride SA (KLOR-CON M) 20 MEQ tablet Take 1 tablet (20 mEq total) by mouth 2  (two) times daily. 11/02/22   Glade Lloyd, MD  SUMAtriptan (IMITREX) 50 MG tablet Take 50 mg by mouth every 2 (two) hours as needed for migraine or headache. 10/19/22   [provider]  tamsulosin (FLOMAX) 0.4 MG CAPS capsule Take 1 capsule (0.4 mg total) by mouth daily after supper. 11/02/22   Glade Lloyd, MD      Allergies    Nubain [nalbuphine hcl]    Review of Systems   Review of Systems  Cardiovascular:  Positive for syncope.    Physical Exam Updated Vital Signs BP 128/66   Pulse 91   Temp 98.1 F (36.7 C)   Resp 18   Ht 5\' 11"  (1.803 m)   Wt 61.2 kg   SpO2 95%   BMI 18.82 kg/m  Physical Exam Constitutional:      General: He is not in acute distress. HENT:     Head: Normocephalic and atraumatic.     Right Ear: External ear normal.     Left Ear: External ear normal.     Nose: Nose normal.     Mouth/Throat:     Mouth: Mucous membranes are dry.     Pharynx: Oropharynx is clear.  Eyes:     Extraocular Movements: Extraocular movements intact.     Comments: Purulent drainage medial right eye  Cardiovascular:     Rate and Rhythm: Tachycardia present. Rhythm irregular.  Pulses: Normal pulses.  Pulmonary:     Effort: Pulmonary effort is normal. No respiratory distress.     Breath sounds: No wheezing.  Abdominal:     General: Abdomen is flat.     Palpations: Abdomen is soft.     Tenderness: There is no abdominal tenderness. There is no guarding or rebound.  Musculoskeletal:        General: Normal range of motion.     Right lower leg: No edema.     Left lower leg: No edema.  Skin:    General: Skin is warm and dry.     Findings: No rash.  Neurological:     Mental Status: He is alert. He is disoriented.     Cranial Nerves: No cranial nerve deficit.     Motor: No weakness.     Comments: Tells me his name, date of birth and that he is at [facility].  When asked about month and year patient states "hell no I do not know."     ED Results /  Procedures / Treatments   Labs (all labs ordered are listed, but only abnormal results are displayed) Labs Reviewed  CBC WITH DIFFERENTIAL/PLATELET - Abnormal; Notable for the following components:      Result Value   RBC 3.07 (*)    Hemoglobin 9.3 (*)    HCT 28.4 (*)    Platelets 146 (*)    Neutro Abs 8.6 (*)    Lymphs Abs 0.3 (*)    All other components within normal limits  COMPREHENSIVE METABOLIC PANEL - Abnormal; Notable for the following components:   Sodium 134 (*)    Potassium 2.6 (*)    Chloride 97 (*)    CO2 21 (*)    Glucose, Bld 222 (*)    Creatinine, Ser 1.87 (*)    Calcium 8.0 (*)    Total Protein 5.7 (*)    Albumin 2.2 (*)    GFR, Estimated 37 (*)    Anion gap 16 (*)    All other components within normal limits  MAGNESIUM - Abnormal; Notable for the following components:   Magnesium 1.6 (*)    All other components within normal limits  TROPONIN I (HIGH SENSITIVITY) - Abnormal; Notable for the following components:   Troponin I (High Sensitivity) 64 (*)    All other components within normal limits  CK  TROPONIN I (HIGH SENSITIVITY)    EKG EKG Interpretation Date/Time:  Saturday November 05 2022 19:54:53 EDT Ventricular Rate:  121 PR Interval:    QRS Duration:  88 QT Interval:  334 QTC Calculation: 474 R Axis:   84  Text Interpretation: Atrial fibrillation Borderline right axis deviation Borderline ST depression, lateral leads Confirmed by Pricilla Loveless [phone]) on 11/05/2022 8:13:28 PM  Radiology CT Head Wo Contrast  Result Date: 11/05/2022 CLINICAL DATA:  Altered mental status EXAM: CT HEAD WITHOUT CONTRAST TECHNIQUE: Contiguous axial images were obtained from the base of the skull through the vertex without intravenous contrast. RADIATION DOSE REDUCTION: This exam was performed according to the departmental dose-optimization program which includes automated exposure control, adjustment of the mA and/or kV according to patient size and/or use of  iterative reconstruction technique. COMPARISON:  10/28/2022 FINDINGS: Brain: There is no mass, hemorrhage or extra-axial collection. There is generalized atrophy without lobar predilection. Hypodensity of the white matter is most commonly associated with chronic microvascular disease. Old left basal ganglia small vessel infarct. Vascular: Atherosclerotic calcification of the internal carotid arteries at the  skull base. No abnormal hyperdensity of the major intracranial arteries or dural venous sinuses. Skull: The visualized skull base, calvarium and extracranial soft tissues are normal. Sinuses/Orbits: Opacification of the right sphenoid sinus with chronic osseous thickening. The other paranasal sinuses are clear. No mastoid or middle ear effusion. Normal orbits. IMPRESSION: 1. No acute intracranial abnormality. 2. Generalized atrophy and findings of chronic microvascular disease. 3. Old left basal ganglia small vessel infarct. 4. Chronic right sphenoid sinusitis. Electronically Signed   By: Deatra Robinson M.D.   On: 11/05/2022 21:36   DG Chest Portable 1 View  Result Date: 11/05/2022 CLINICAL DATA:  Altered mental status and multiple syncopal episodes with generalized weakness. EXAM: PORTABLE CHEST 1 VIEW COMPARISON:  PA Lat chest 10/27/2022 FINDINGS: There is a tangle of overlying monitor wiring. The cardiomediastinal silhouette and vascular pattern are normal. The mediastinum is stable with moderate aortic calcific plaques. The lungs are mildly emphysematous but clear. Thoracic cage is intact. IMPRESSION: No evidence of acute chest disease. Stable COPD chest with aortic atherosclerosis. Electronically Signed   By: Almira Bar M.D.   On: 11/05/2022 21:08    Procedures Procedures    Medications Ordered in ED Medications  potassium chloride 10 mEq in 100 mL IVPB (has no administration in time range)  potassium chloride SA (KLOR-CON M) CR tablet 20 mEq (has no administration in time range)  magnesium  sulfate IVPB 2 g 50 mL (has no administration in time range)  lactated ringers bolus 1,000 mL (1,000 mLs Intravenous New Bag/Given 11/05/22 2131)    ED Course/ Medical Decision Making/ A&P                                 Medical Decision Making Amount and/or Complexity of Data Reviewed Labs: ordered. Radiology: ordered.  Risk Prescription drug management. Decision regarding hospitalization.   77 year old male with past medical history and HPI as above.  Vital signs stable upon arrival, but patient is notably confused.  Unable to tell me the day of the week, month, or year.  He also appears dry on exam with dry mucous membranes.  Patient is notably tachycardic with an irregular rhythm on the monitor, heart rate ranging from 100-130 during my evaluation.  Blood pressure is stable and patient is alert and talking throughout.  No known history of atrial fibrillation per record review.  Patient was recently hospitalized from 10/27/2022 through 11/02/22.  This was secondary to weakness and multiple falls as well as AKI.  He had some evidence of rhabdomyolysis during that evaluation as well.  At discharge, SNF placement was recommended, but patient was refusing and wanted home health PT.  Patient returns today with what sounds like continued weakness with presyncopal episodes.  Son at bedside confirms that patient is more confused than normal.  He also reports that there is no history of atrial fibrillation.  Apparently patient was found on the floor today at his home after he had soiled himself.  Initial Differential Diagnoses: anemia, cardiac arrhythmia, ACS/MI, hypoglycemia, AAA, aortic dissection, ectopic rupture, PE, subarachnoid hemorrhage, orthostatic hypotension, seizure, vasovagal reaction, vertebrobasilar insufficiency  I am most concerned for arrhythmia versus metabolic derangement versus dehydration.   Head CT without acute intracranial abnormality that would account for his symptoms.   Chest x-ray with without focal airspace findings or consolidation suggestive of pneumonia or active infection.  On CBC, patient is a mildly decreased hemoglobin of 9.3.  His metabolic  panel shows significant electrolyte derangements with a potassium of 2.6.  He is provided both oral and IV replacement.  Also has a low magnesium (1.6) for which IV replacement was provided.  Patient also has an AKI with a creatinine of 1.87 (increased from 1.6 while in the hospital just a few days ago).  Initial troponin was 64.  Patient's EKG does show atrial fibrillation, which appears to be a new rhythm change for him.  Atrial fibrillation may be secondary to electrolyte derangements described above.  This may also lead to the elevated troponin. Fortunately, blood pressure stable throughout ED stay. Alert, but remains confused, which son confirms is worse than his baseline.   Given patient's recurrent syncopal episodes, electrolyte derangements, and new atrial fibrillation, do feel that he needs admission to the hospital for continued evaluation and management.  Inpatient team can also address the right eye drainage, as there is concern for bacterial conjunctivitis given the purulent nature.  I saw this patient in conjunction with my attending physician, Dr. Criss Alvine, who provided oversight.     Final Clinical Impression(s) / ED Diagnoses Final diagnoses:  Syncope and collapse  Atrial fibrillation, unspecified type [facility])    Rx / DC Orders ED Discharge Orders     None         Lyman Speller, MD 11/05/22 2257    Pricilla Loveless, MD 11/14/22 1539

## 2022-11-06 ENCOUNTER — Inpatient Hospital Stay (HOSPITAL_COMMUNITY): Payer: Medicare HMO

## 2022-11-06 ENCOUNTER — Other Ambulatory Visit (HOSPITAL_COMMUNITY): Payer: Medicare HMO

## 2022-11-06 DIAGNOSIS — I4891 Unspecified atrial fibrillation: Secondary | ICD-10-CM | POA: Diagnosis not present

## 2022-11-06 DIAGNOSIS — N179 Acute kidney failure, unspecified: Secondary | ICD-10-CM

## 2022-11-06 DIAGNOSIS — R55 Syncope and collapse: Secondary | ICD-10-CM | POA: Diagnosis not present

## 2022-11-06 DIAGNOSIS — R7881 Bacteremia: Secondary | ICD-10-CM | POA: Diagnosis not present

## 2022-11-06 DIAGNOSIS — B9561 Methicillin susceptible Staphylococcus aureus infection as the cause of diseases classified elsewhere: Secondary | ICD-10-CM | POA: Diagnosis not present

## 2022-11-06 LAB — URINALYSIS, W/ REFLEX TO CULTURE (INFECTION SUSPECTED)
Bilirubin Urine: NEGATIVE
Glucose, UA: 50 mg/dL — AB
Ketones, ur: NEGATIVE mg/dL
Leukocytes,Ua: NEGATIVE
Nitrite: POSITIVE — AB
Protein, ur: 100 mg/dL — AB
Specific Gravity, Urine: 1.012 (ref 1.005–1.030)
pH: 5 (ref 5.0–8.0)

## 2022-11-06 LAB — BASIC METABOLIC PANEL
Anion gap: 10 (ref 5–15)
BUN: 23 mg/dL (ref 8–23)
CO2: 21 mmol/L — ABNORMAL LOW (ref 22–32)
Calcium: 7.7 mg/dL — ABNORMAL LOW (ref 8.9–10.3)
Chloride: 101 mmol/L (ref 98–111)
Creatinine, Ser: 1.7 mg/dL — ABNORMAL HIGH (ref 0.61–1.24)
GFR, Estimated: 41 mL/min — ABNORMAL LOW (ref >60)
Glucose, Bld: 295 mg/dL — ABNORMAL HIGH (ref 70–99)
Potassium: 3.3 mmol/L — ABNORMAL LOW (ref 3.5–5.1)
Sodium: 132 mmol/L — ABNORMAL LOW (ref 135–145)

## 2022-11-06 LAB — TROPONIN I (HIGH SENSITIVITY)
Troponin I (High Sensitivity): 67 ng/L — ABNORMAL HIGH (ref <18)
Troponin I (High Sensitivity): 80 ng/L — ABNORMAL HIGH (ref <18)
Troponin I (High Sensitivity): 82 ng/L — ABNORMAL HIGH (ref <18)

## 2022-11-06 LAB — BLOOD CULTURE ID PANEL (REFLEXED) - BCID2

## 2022-11-06 LAB — RESPIRATORY PANEL BY PCR

## 2022-11-06 LAB — CBC
HCT: 26.8 % — ABNORMAL LOW (ref 39.0–52.0)
Hemoglobin: 9 g/dL — ABNORMAL LOW (ref 13.0–17.0)
MCH: 31 pg (ref 26.0–34.0)
MCHC: 33.6 g/dL (ref 30.0–36.0)
MCV: 92.4 fL (ref 80.0–100.0)
Platelets: 134 10*3/uL — ABNORMAL LOW (ref 150–400)
RBC: 2.9 MIL/uL — ABNORMAL LOW (ref 4.22–5.81)
RDW: 13.4 % (ref 11.5–15.5)
WBC: 9 10*3/uL (ref 4.0–10.5)
nRBC: 0 % (ref 0.0–0.2)

## 2022-11-06 LAB — SARS CORONAVIRUS 2 BY RT PCR: SARS Coronavirus 2 by RT PCR: NEGATIVE

## 2022-11-06 LAB — MRSA NEXT GEN BY PCR, NASAL: MRSA by PCR Next Gen: NOT DETECTED

## 2022-11-06 MED ORDER — ENSURE ENLIVE PO LIQD
237.0000 mL | Freq: Two times a day (BID) | ORAL | Status: DC
  Administered 2022-11-06 – 2022-11-09 (×7): 237 mL via ORAL

## 2022-11-06 MED ORDER — POTASSIUM CHLORIDE CRYS ER 20 MEQ PO TBCR
40.0000 meq | EXTENDED_RELEASE_TABLET | Freq: Once | ORAL | Status: AC
  Administered 2022-11-06: 40 meq via ORAL
  Filled 2022-11-06: qty 2

## 2022-11-06 MED ORDER — SODIUM CHLORIDE 0.9 % IV SOLN
2.0000 g | Freq: Every day | INTRAVENOUS | Status: DC
  Administered 2022-11-06: 2 g via INTRAVENOUS
  Filled 2022-11-06 (×2): qty 20

## 2022-11-06 MED ORDER — CEFAZOLIN SODIUM-DEXTROSE 2-4 GM/100ML-% IV SOLN
2.0000 g | Freq: Three times a day (TID) | INTRAVENOUS | Status: DC
  Administered 2022-11-07 – 2022-11-14 (×23): 2 g via INTRAVENOUS
  Filled 2022-11-06 (×28): qty 100

## 2022-11-06 MED ORDER — TAMSULOSIN HCL 0.4 MG PO CAPS
0.4000 mg | ORAL_CAPSULE | Freq: Every day | ORAL | Status: DC
  Administered 2022-11-06 – 2022-11-13 (×8): 0.4 mg via ORAL
  Filled 2022-11-06 (×8): qty 1

## 2022-11-06 MED ORDER — OXYCODONE HCL 5 MG PO TABS
10.0000 mg | ORAL_TABLET | Freq: Three times a day (TID) | ORAL | Status: DC | PRN
  Administered 2022-11-06: 10 mg via ORAL
  Filled 2022-11-06: qty 2

## 2022-11-06 NOTE — Progress Notes (Signed)
   11/06/22 0412  Assess: MEWS Score  Temp (!) 102.9 F (39.4 C)  BP 130/63  MAP (mmHg) 82  Pulse Rate (!) 105  ECG Heart Rate (!) 105  Resp 20  Level of Consciousness Alert  SpO2 95 %  O2 Device Room Air  Assess: MEWS Score  MEWS Temp 2  MEWS Systolic 0  MEWS Pulse 1  MEWS RR 0  MEWS LOC 0  MEWS Score 3  MEWS Score Color Yellow  Assess: if the MEWS score is Yellow or Red  Were vital signs accurate and taken at a resting state? Yes  Does the patient meet 2 or more of the SIRS criteria? Yes  Does the patient have a confirmed or suspected source of infection? Yes  MEWS guidelines implemented  Yes, yellow  Treat  MEWS Interventions Considered administering scheduled or prn medications/treatments as ordered  Take Vital Signs  Increase Vital Sign Frequency  Yellow: Q2hr x1, continue Q4hrs until patient remains green for 12hrs  Escalate  MEWS: Escalate Yellow: Discuss with charge nurse and consider notifying provider and/or RRT  Notify: Charge Nurse/RN  Name of Charge Nurse/RN Notified Jill, RN  Assess: SIRS CRITERIA  SIRS Temperature  1  SIRS Pulse 1  SIRS Respirations  0  SIRS WBC 0  SIRS Score Sum  2

## 2022-11-06 NOTE — Progress Notes (Signed)
Pharmacy Antibiotic Note  Michael Bender is a 77 y.o. male admitted on 11/05/2022 with MSSA bacteremia.  Pharmacy has been consulted for cefazolin dosing.   Febrile with Tm 102.9, WBC are normal. AKI on admit, SCr down to 1.7 (baseline ~0.8-1). TEE planned to r/o IE. ID consulted.   Plan: Cefazolin 2 g IV q8h Monitor renal function and adjust dosing if CrCl <30 ml/min Monitor clinical progress, cultures/sensitivities F/U LOT, ECHO  Height: 5\' 11"  (180.3 cm) Weight: 61.2 kg (134 lb 14.7 oz) IBW/kg (Calculated) : 75.3  Temp (24hrs), Avg:99.3 F (37.4 C), Min:98 F (36.7 C), Max:102.9 F (39.4 C)  Recent Labs  Lab 10/31/22 0405 11/01/22 0418 11/02/22 0306 11/05/22 2035 11/06/22 0903  WBC 4.5  --   --  9.3 9.0  CREATININE 2.13* 1.65* 1.61* 1.87* 1.70*    Estimated Creatinine Clearance: 31.5 mL/min (A) (by C-G formula based on SCr of 1.7 mg/dL (H)).    Allergies  Allergen Reactions   Nubain [Nalbuphine Hcl]     Muscle contraction    Antimicrobials this admission: Ceftriaxone 10/6 x1 Cefazolin 10/7 >>   Dose adjustments this admission:   Microbiology results: 10/6 BCx: GPC (MSSA on BCID) 10/6 MRSA PCR: neg 10/6 Resp panel: neg  Thank you for involving pharmacy in this patient's care.  Loura Back, PharmD, BCPS Clinical Pharmacist Clinical phone for 11/06/2022 is [phone] 11/06/2022 3:37 PM

## 2022-11-06 NOTE — Evaluation (Signed)
Occupational Therapy Evaluation Patient Details Name: Michael Bender MRN: [id number] DOB: 02-21-1945 Today's Date: 11/06/2022   History of Present Illness Michael Bender is a 77 y.o. male admitted 10/5 with syncope and new onset afib with RVR as well as generalized weakness and right index finger swelling and pain. PMH: peripheral vascular disease, hypertension, hyperlipidemia, history of orthostatic hypotension, GERD, recently admitted to the hospital from 10/27/2022 through 11/02/22 due to multiple falls, traumatic rhabdomyolysis, AKI, acute urinary retention with bladder outlet obstruction, BPH, was offered SNF and home health PT but declined   Clinical Impression   Pt questionable historian, reports at baseline he ambulates with a walking stick and is ind with ADLs, and has not had any recent falls. Pt lives alone but family is nearby to assist. Pt currently needing set up -mod A for ADLs, and min A for transfers/in room ambulation with RW. Pt with mild unsteadiness needing cues for safety. No orthostatic symptoms noted during session, BP 117/60 (77) at end of session. Pt presenting with impairments listed below, will follow acutely. Patient will benefit from continued inpatient follow up therapy, <3 hours/day to maximize safety/ind with ADLs/functional mobility.       If plan is discharge home, recommend the following: A little help with walking and/or transfers;A lot of help with bathing/dressing/bathroom;Assistance with cooking/housework;Supervision due to cognitive status;Help with stairs or ramp for entrance;Assist for transportation;Direct supervision/assist for medications management;Direct supervision/assist for financial management    Functional Status Assessment  Patient has had a recent decline in their functional status and demonstrates the ability to make significant improvements in function in a reasonable and predictable amount of time.  Equipment Recommendations  Other  (comment) (defer)    Recommendations for Other Services PT consult     Precautions / Restrictions Precautions Precautions: Fall Precaution Comments: many recent falls Restrictions Weight Bearing Restrictions: No      Mobility Bed Mobility               General bed mobility comments: OOB in chair upon arrival and departure    Transfers Overall transfer level: Needs assistance Equipment used: Rolling walker (2 wheels) Transfers: Sit to/from Stand Sit to Stand: Min assist                  Balance Overall balance assessment: Needs assistance Sitting-balance support: Feet supported, No upper extremity supported Sitting balance-Leahy Scale: Good     Standing balance support: Bilateral upper extremity supported, During functional activity Standing balance-Leahy Scale: Poor Standing balance comment: reliant on RW support                           ADL either performed or assessed with clinical judgement   ADL Overall ADL's : Needs assistance/impaired Eating/Feeding: Set up;Sitting   Grooming: Wash/dry face;Standing;Set up   Upper Body Bathing: Minimal assistance   Lower Body Bathing: Moderate assistance   Upper Body Dressing : Minimal assistance   Lower Body Dressing: Moderate assistance   Toilet Transfer: Minimal assistance;Ambulation;Regular Toilet   Toileting- Clothing Manipulation and Hygiene: Minimal assistance       Functional mobility during ADLs: Minimal assistance;Rolling walker (2 wheels)       Vision   Vision Assessment?: No apparent visual deficits     Perception Perception: Not tested       Praxis Praxis: Not tested       Pertinent Vitals/Pain Pain Assessment Pain Assessment: No/denies pain     Extremity/Trunk  Assessment Upper Extremity Assessment Upper Extremity Assessment: Generalized weakness (R finger swelling, functional for grasping RW and BADL task)   Lower Extremity Assessment Lower Extremity  Assessment: Defer to PT evaluation RLE Deficits / Details: incoordination worse R>L with heel-to-shin test; poor motor grading with heavy foot fall in hallway. Pt states sensation WFL RLE Coordination: decreased gross motor;decreased fine motor LLE Deficits / Details: incoordination worse R>L with heel-to-shin test; poor motor grading with heavy foot fall in hallway. Pt states sensation WFL LLE Coordination: decreased gross motor;decreased fine motor   Cervical / Trunk Assessment Cervical / Trunk Assessment: Normal   Communication Communication Communication: Hearing impairment   Cognition Arousal: Alert Behavior During Therapy: Flat affect Overall Cognitive Status: No family/caregiver present to determine baseline cognitive functioning Area of Impairment: Following commands, Safety/judgement, Problem solving                       Following Commands: Follows one step commands with increased time Safety/Judgement: Decreased awareness of safety, Decreased awareness of deficits   Problem Solving: Slow processing, Requires verbal cues, Requires tactile cues General Comments: decr awareness of deficits/safety     General Comments  BP 117/60 (77) at end of session, pt denies orthostatic symptoms    Exercises     Shoulder Instructions      Home Living Family/patient expects to be discharged to:: Private residence Living Arrangements: Alone Available Help at Discharge: Family;Available PRN/intermittently Type of Home: House Home Access: Stairs to enter [employer] of Steps: 1 Entrance Stairs-Rails: None Home Layout: One level     Bathroom Shower/Tub: Chief Strategy Officer: Standard     Home Equipment: None   Additional Comments: on recent admit, chart said pt has no equipment.  Pt tells this PT he has a walking stick, RW, 3N1, shower seat in walk in shower.      Prior Functioning/Environment Prior Level of Function : Independent/Modified  Independent;History of Falls (last six months);Driving             Mobility Comments: reports using walking stick ADLs Comments: ind        OT Problem List: Decreased strength;Decreased range of motion;Decreased activity tolerance;Impaired balance (sitting and/or standing);Decreased cognition;Decreased safety awareness;Cardiopulmonary status limiting activity      OT Treatment/Interventions: Self-care/ADL training;Therapeutic exercise;Energy conservation;DME and/or AE instruction;Therapeutic activities;Patient/family education;Balance training    OT Goals(Current goals can be found in the care plan section) Acute Rehab OT Goals Patient Stated Goal: none stated OT Goal Formulation: With patient Time For Goal Achievement: 11/20/22 Potential to Achieve Goals: Good ADL Goals Pt Will Perform Upper Body Dressing: with modified independence;sitting Pt Will Perform Lower Body Dressing: with modified independence;sit to/from stand;sitting/lateral leans Pt Will Transfer to Toilet: with modified independence;ambulating;regular height toilet Pt Will Perform Tub/Shower Transfer: Tub transfer;Shower transfer;with modified independence;ambulating Additional ADL Goal #1: pt will verbalize x3 fall prevention strategies in order to promote safety with ADLs  OT Frequency: Min 1X/week    Co-evaluation              AM-PAC OT "6 Clicks" Daily Activity     Outcome Measure Help from another person eating meals?: A Little Help from another person taking care of personal grooming?: A Little Help from another person toileting, which includes using toliet, bedpan, or urinal?: A Little Help from another person bathing (including washing, rinsing, drying)?: A Lot Help from another person to put on and taking off regular upper body clothing?: A Little Help  from another person to put on and taking off regular lower body clothing?: A Lot 6 Click Score: 16   End of Session Equipment Utilized During  Treatment: Gait belt;Rolling walker (2 wheels) Nurse Communication: Mobility status  Activity Tolerance: Patient tolerated treatment well Patient left: in chair;with call bell/phone within reach;with chair alarm set;with nursing/sitter in room  OT Visit Diagnosis: Unsteadiness on feet (R26.81);Other abnormalities of gait and mobility (R26.89);Muscle weakness (generalized) (M62.81);History of falling (Z91.81)                Time: 1610-9604 OT Time Calculation (min): 22 min Charges:  OT General Charges $OT Visit: 1 Visit OT Evaluation $OT Eval Moderate Complexity: 1 Mod  Elric Tirado K, OTD, OTR/L SecureChat Preferred Acute Rehab [phone]   Carver Fila Koonce 11/06/2022, 12:43 PM

## 2022-11-06 NOTE — Consult Note (Signed)
[facility]    Date of Admission:  11/05/2022     Reason for Consult: mssa bacteremia    Referring Provider: Blenda Peals     Lines:  Peripheral iv's  Abx: 10/6-c cefazolin  10/6 one dose ceftriaxone        Assessment: 77 yo male with hx cervical/lumbar hardware, PAD, htn/hlp, hx cva, copd, chronic pain, constipation, recent hospital admission 9/26-10/02 in setting of multiple falls/rhabdo/urinary retention and aki, discharged to snf readmitted 10/5 due to episodes of syncope, generalized weakness, and right index finger swelling/pain found to have mssa bacteremia meeting sepis criteria   He reports similar presentation like 9/26 -- generalized weakness/falls, not improved since previous admission. Currently sx as below mentioned with some new focal pain.   10/6 bcx mssa  He does have pain/tenderness left wrist and right knee.  He has hx of cervical/lumbar hardware but no focal tenderness along the spine  His arms doesn't have any sign of thrombophlebitis and his lower ext doesn't exhibit any assymetric edema or suggestion of dvt where the mssa could seed or thrombophlebitis suggesting of initial source  He wears dentures  Plan: Mri left wrist and right knee; if these are negative or repeat bcx positive, would proceed with endocarditis w/u Repeat bcx tomorrow Echo ordered and likely will need tee as well r/o endocarditis given several days had lapsed since last discharge and ongoing sx of weakness/falls I normally would get dvt of extremities to r/o thrombosis complication of seeding but will start with above first Continue cefazolin for now Discussed with primary team     ------------------------------------------------ Principal Problem:   Syncope    HPI: Michael Bender is a 77 y.o. male admitted from snf for weakness/ falls after discharged from hospital on 10/2   Reviewed chart 9/26-10/2 admission similar  presentation --> complications of aki, bladder outlet obstruction, rhabdo, no sepsis at that time. Foley placement required for bladder outlet obstruction removed prior to discharge. Aki resolved. Started on tamsulosin  On discharge to snf, he continues to have generalized weakness and several episodes presyncope/syncope so readmitted  On presentation Febrile to 103 Cxr, renal u/s, head ct as below Cr 1.8 (on discharge 4.2--> 1.6); lft normal Respiratory viral pcr negative No leukocytosis; mild thrombocytopenia stable Trops elevated  Urine with nitrites, rare bacteria, 11-20 rbc, only 0-5 wbc Started on ceftriaxone for concern of uti   He does have cervical/lumbar hardware but no neck pain  He endorsed new left wrist and right knee pain of a few days  No cough, chest pain, dyspnea, rash No n/v/diarrhea   Family History  Problem Relation Age of Onset   Heart disease Mother    Heart attack Mother 54   Pancreatic cancer Father 36   Pancreatic cancer Brother    Anesthesia problems Neg Hx    Colon cancer Neg Hx    Liver cancer Neg Hx    Stomach cancer Neg Hx    Esophageal cancer Neg Hx    Rectal cancer Neg Hx     Social History   Tobacco Use   Smoking status: Some Days    Current packs/day: 0.00    Types: Cigarettes    Start date: 12/12/1978    Last attempt to quit: 12/12/2018    Years since quitting: 3.9   Smokeless tobacco: Never  Vaping Use   Vaping status: Never Used  Substance Use Topics   Alcohol use: Not  Currently    Alcohol/week: 2.0 standard drinks of alcohol    Types: 2 Shots of liquor per week   Drug use: No    Allergies  Allergen Reactions   Nubain [Nalbuphine Hcl]     Muscle contraction    Review of Systems: ROS All Other ROS was negative, except mentioned above   Past Medical History:  Diagnosis Date   Arthritis    Back   Blood transfusion    as a child   Chronic back pain    COPD (chronic obstructive pulmonary disease) (HCC)     Disturbance of skin sensation 05/22/2018   Essential hypertension 03/05/2007   Qualifier: Diagnosis of   By: Tawanna Cooler MD, Tinnie Gens A      Gastric erosion    Gastritis and gastroduodenitis    Gastrointestinal hemorrhage with melena 11/20/2018   GIB (gastrointestinal bleeding) 07/18/2019   Headache(784.0)    last one 6 months ago   Hemorrhoids, internal    History of duodenal ulcer    History of lumbar fusion 03/03/2022   RE-OPERATIVE DIAGNOSIS:  lumbar stenosis synovial cyst lumbar spondylosis spondylolisthesis lumbar radiculoapthy L4/5   PROCEDURE:  Procedure(s): POSTERIOR LUMBAR FUSION 1 LEVEL with resection of synovial cyst     History of upper gastrointestinal bleeding 03/03/2022   Duodenal ulcer 2021 Dr. [name]   HLD (hyperlipidemia)    Hypertension    Loss of weight    Wt Readings from Last 10 Encounters:  04/05/22  146 lb (66.2 kg)  03/03/22  143 lb 9.6 oz (65.1 kg)  07/14/21  153 lb (69.4 kg)  12/14/20  147 lb 12.8 oz (67 kg)  11/13/19  154 lb (69.9 kg)  09/16/19  146 lb (66.2 kg)  08/06/19  146 lb (66.2 kg)  07/19/19  144 lb 13.5 oz (65.7 kg)  07/17/19  148 lb (67.1 kg)  07/09/19  147 lb 9.6 oz (67 kg)         Neck rigidity    post cervical fusion   Nocturia    PAIN, CHRONIC NEC 10/06/2006   Qualifier: Diagnosis of   By: Tawanna Cooler RN, Alvino Chapel       Pneumonia    Prostate disease    S/P cervical spinal fusion 03/03/2022   With persistent cervical pain and palpable screws Led to disability History of attempt to dig furrow in skull to fix it Last surgery 1992       Scheduled Meds:  enoxaparin (LOVENOX) injection  30 mg Subcutaneous Daily   feeding supplement  237 mL Oral BID BM   tamsulosin  0.4 mg Oral QPC supper   Continuous Infusions:  cefTRIAXone (ROCEPHIN)  IV Stopped (11/06/22 0151)   lactated ringers 1,000 mL with potassium chloride 20 mEq infusion 75 mL/hr at 11/06/22 0330   PRN Meds:.acetaminophen, melatonin, polyethylene glycol,  prochlorperazine   OBJECTIVE: Blood pressure 117/63, pulse 83, temperature 98.2 F (36.8 C), temperature source Oral, resp. rate (!) 22, height 5\' 11"  (1.803 m), weight 61.2 kg, SpO2 97%.  Physical Exam General/constitutional: no distress, pleasant HEENT: Normocephalic, PER, Conj Clear, EOMI, Oropharynx clear; dentures in place Neck supple CV: rrr no mrg Lungs: clear to auscultation, normal respiratory effort Abd: Soft, Nontender Ext: no edema Skin: skin maceration lue -- no streak like/cord like impression on bilateral upper extremities Neuro: nonfocal - generalized weakness MSK: left wrist tender ulnar area > radial and mild sponginess sensation at the dorsal volar aspect. Right knee mild effusion and tender on palpation and rom; back spine  nontender     Lab Results Lab Results  Component Value Date   WBC 9.0 11/06/2022   HGB 9.0 (L) 11/06/2022   HCT 26.8 (L) 11/06/2022   MCV 92.4 11/06/2022   PLT 134 (L) 11/06/2022    Lab Results  Component Value Date   CREATININE 1.70 (H) 11/06/2022   BUN 23 11/06/2022   NA 132 (L) 11/06/2022   K 3.3 (L) 11/06/2022   CL 101 11/06/2022   CO2 21 (L) 11/06/2022    Lab Results  Component Value Date   ALT 22 11/05/2022   AST 28 11/05/2022   ALKPHOS 116 11/05/2022   BILITOT 0.4 11/05/2022      Microbiology: Recent Results (from the past 240 hour(s))  Culture, blood (Routine X 2) w Reflex to ID Panel     Status: None (Preliminary result)   Collection Time: 11/06/22  1:13 AM   Specimen: BLOOD RIGHT ARM  Result Value Ref Range Status   Specimen Description BLOOD RIGHT ARM  Final   Special Requests   Final    BOTTLES DRAWN AEROBIC AND ANAEROBIC Blood Culture results may not be optimal due to an excessive volume of blood received in culture bottles   Culture  Setup Time   Final    GRAM POSITIVE COCCI IN BOTH AEROBIC AND ANAEROBIC BOTTLES Organism ID to follow CRITICAL RESULT CALLED TO, READ BACK BY AND VERIFIED WITH: PHARMD  JESSICA MILLEN [id number] BY Berline Chough, [place] Performed at [facility], [address] [place], [place] [address]    Culture GRAM POSITIVE COCCI  Final   Report Status PENDING  Incomplete  Blood Culture ID Panel (Reflexed)     Status: Abnormal   Collection Time: 11/06/22  1:13 AM  Result Value Ref Range Status   Enterococcus faecalis NOT DETECTED NOT DETECTED Final   Enterococcus Faecium NOT DETECTED NOT DETECTED Final   Listeria monocytogenes NOT DETECTED NOT DETECTED Final   Staphylococcus species DETECTED (A) NOT DETECTED Final    Comment: CRITICAL RESULT CALLED TO, READ BACK BY AND VERIFIED WITH: PHARMD JESSICA MILLEN [phone] 1429 BY J RAZZAK, MT    Staphylococcus aureus (BCID) DETECTED (A) NOT DETECTED Final    Comment: CRITICAL RESULT CALLED TO, READ BACK BY AND VERIFIED WITH: PHARMD JESSICA MILLEN [phone] 1429 BY J RAZZAK, MT    Staphylococcus epidermidis NOT DETECTED NOT DETECTED Final   Staphylococcus lugdunensis NOT DETECTED NOT DETECTED Final   Streptococcus species NOT DETECTED NOT DETECTED Final   Streptococcus agalactiae NOT DETECTED NOT DETECTED Final   Streptococcus pneumoniae NOT DETECTED NOT DETECTED Final   Streptococcus pyogenes NOT DETECTED NOT DETECTED Final   A.calcoaceticus-baumannii NOT DETECTED NOT DETECTED Final   Bacteroides fragilis NOT DETECTED NOT DETECTED Final   Enterobacterales NOT DETECTED NOT DETECTED Final   Enterobacter cloacae complex NOT DETECTED NOT DETECTED Final   Escherichia coli NOT DETECTED NOT DETECTED Final   Klebsiella aerogenes NOT DETECTED NOT DETECTED Final   Klebsiella oxytoca NOT DETECTED NOT DETECTED Final   Klebsiella pneumoniae NOT DETECTED NOT DETECTED Final   Proteus species NOT DETECTED NOT DETECTED Final   Salmonella species NOT DETECTED NOT DETECTED Final   Serratia marcescens NOT DETECTED NOT DETECTED Final   Haemophilus influenzae NOT DETECTED NOT DETECTED Final   Neisseria meningitidis NOT DETECTED NOT  DETECTED Final   Pseudomonas aeruginosa NOT DETECTED NOT DETECTED Final   Stenotrophomonas maltophilia NOT DETECTED NOT DETECTED Final   Candida albicans NOT DETECTED NOT DETECTED Final  Candida auris NOT DETECTED NOT DETECTED Final   Candida glabrata NOT DETECTED NOT DETECTED Final   Candida krusei NOT DETECTED NOT DETECTED Final   Candida parapsilosis NOT DETECTED NOT DETECTED Final   Candida tropicalis NOT DETECTED NOT DETECTED Final   Cryptococcus neoformans/gattii NOT DETECTED NOT DETECTED Final   Meth resistant mecA/C and MREJ NOT DETECTED NOT DETECTED Final    Comment: Performed at [facility], [address], [place], [place] [address]  Culture, blood (Routine X 2) w Reflex to ID Panel     Status: None (Preliminary result)   Collection Time: 11/06/22  1:14 AM   Specimen: BLOOD RIGHT ARM  Result Value Ref Range Status   Specimen Description BLOOD RIGHT ARM  Final   Special Requests   Final    BOTTLES DRAWN AEROBIC AND ANAEROBIC Blood Culture results may not be optimal due to an excessive volume of blood received in culture bottles   Culture  Setup Time   Final    GRAM POSITIVE COCCI IN BOTH AEROBIC AND ANAEROBIC BOTTLES Performed at [facility], [address] [place], [place] [address]    Culture GRAM POSITIVE COCCI  Final   Report Status PENDING  Incomplete  MRSA Next Gen by PCR, Nasal     Status: None   Collection Time: 11/06/22  1:29 AM   Specimen: Nasal Mucosa; Nasal Swab  Result Value Ref Range Status   MRSA by PCR Next Gen NOT DETECTED NOT DETECTED Final    Comment: (NOTE) The GeneXpert MRSA Assay (FDA approved for NASAL specimens only), is one component of a comprehensive MRSA colonization surveillance program. It is not intended to diagnose MRSA infection nor to guide or monitor treatment for MRSA infections. Test performance is not FDA approved in patients less than 60 years old. Performed at [facility], [address] [place],  [place] [address]   SARS Coronavirus 2 by RT PCR (hospital order, performed in [facility] hospital lab) *cepheid single result test* Anterior Nasal Swab     Status: None   Collection Time: 11/06/22  7:33 AM   Specimen: Anterior Nasal Swab  Result Value Ref Range Status   SARS Coronavirus 2 by RT PCR NEGATIVE NEGATIVE Final    Comment: Performed at [facility], [address] [place], [place] [address]     Serology:    Imaging: If present, new imagings (plain films, ct scans, and mri) have been personally visualized and interpreted; radiology reports have been reviewed. Decision making incorporated into the Impression / Recommendations.   10/5 cxr No evidence of acute chest disease. Stable COPD chest with aortic Atherosclerosis    10/5 head ct 1. No acute intracranial abnormality. 2. Generalized atrophy and findings of chronic microvascular disease. 3. Old left basal ganglia small vessel infarct. 4. Chronic right sphenoid sinusitis.    10/6 US renal No collecting system dilatation. Bilateral simple appearing renal cysts by ultrasound. Please correlate with the prior CT.   Distended bladder with luminal floating and layering debris. Please correlate with clinical findings.   Raymondo Band, MD [facility] [phone] pager    11/06/2022, 3:11 PM

## 2022-11-06 NOTE — Progress Notes (Signed)
PROGRESS NOTE    Michael Bender  MWN:[id number] DOB: 1945/02/18 DOA: 11/05/2022 PCP: Lula Olszewski, MD    Brief Narrative:  Michael Bender is a 77 y.o. male with medical history significant for peripheral vascular disease, hypertension, hyperlipidemia, history of orthostatic hypotension, GERD, recently admitted to the hospital from 10/27/2022 through 11/02/22 due to multiple falls, traumatic rhabdomyolysis, AKI, acute urinary retention with bladder outlet obstruction, BPH, was offered SNF and home health PT but declined, who presents from home with recurrent episodes of syncope, witnessed by family members.  Associated with generalized weakness and right index finger swelling and pain.    Assessment and Plan: Syncope due to orthostatic vital signs -PT/OT assessment -IV fluid hydration -check cortisol   Fever -blood culture pending -finger does not appear infected: -COVID/NP swab  Elevated troponin, suspect demand ischemia No evidence of acute ischemia on 12 EKG    Newly diagnosed atrial fibrillation with RVR Admission 12-lead EKG with A-fib rate of 121 CHA2DS2-VASc score of 4. - 2D echo -would avoid blood thinner due to falls   Hypokalemia -replete along with Mg   Hypomagnesemia -replete   AKI, suspect prerenal in setting of dehydration from poor oral intake Serum creatinine appears to be 1.0 GFR greater than 60 Presented with creatinine of 1.87 with GFR of 37 IVF -check renal U/S   Physical debility PT/OT assessment Fall precautions.   Patient declined SNF in prior hospitalization-- now not safe to be home alone--- says he does not want to return to the hospital even if that means he dies--- palliative care consult  DVT prophylaxis: Place TED hose Start: 11/06/22 1024 enoxaparin (LOVENOX) injection 30 mg Start: 11/06/22 1000    Code Status: Full Code Family Communication:   Disposition Plan:  Level of care: Telemetry Cardiac Status is:  Inpatient Remains inpatient appropriate     Consultants:  Palliative care   Subjective: Does not want to go to SNF  Objective: Vitals:   11/05/22 2358 11/06/22 0412 11/06/22 0636 11/06/22 0802  BP: 133/66 130/63 105/61 114/70  Pulse: 95 (!) 105 83 79  Resp: 15 20 (!) 25 (!) 22  Temp: (!) 100.9 F (38.3 C) (!) 102.9 F (39.4 C) 98 F (36.7 C) 98.7 F (37.1 C)  TempSrc: Oral Axillary Axillary Oral  SpO2: 97% 95% 97%   Weight:      Height: 5\' 11"  (1.803 m)       Intake/Output Summary (Last 24 hours) at 11/06/2022 1135 Last data filed at 11/06/2022 0330 Gross per 24 hour  Intake 1322.13 ml  Output --  Net 1322.13 ml   Filed Weights   11/05/22 1949  Weight: 61.2 kg    Examination:   General: Appearance:    Thin male in no acute distress     Lungs:      respirations unlabored  Heart:    Normal heart rate.   MS:   All extremities are intact.    Neurologic:   Awake, alert- Hard of hearing       Data Reviewed: I have personally reviewed following labs and imaging studies  CBC: Recent Labs  Lab 10/31/22 0405 11/05/22 2035 11/06/22 0903  WBC 4.5 9.3 9.0  NEUTROABS  --  8.6*  --   HGB 11.3* 9.3* 9.0*  HCT 32.5* 28.4* 26.8*  MCV 92.1 92.5 92.4  PLT 144* 146* 134*   Basic Metabolic Panel: Recent Labs  Lab 10/31/22 0405 11/01/22 0418 11/01/22 1137 11/01/22 1811 11/02/22 0306 11/05/22 2035 11/06/22  0903  NA 140 139  --   --  138 134* 132*  K 3.6 2.6* 2.7* 3.6 3.1* 2.6* 3.3*  CL 104 98  --   --  104 97* 101  CO2 28 28  --   --  25 21* 21*  GLUCOSE 124* 128*  --   --  195* 222* 295*  BUN 13 11  --   --  9 23 23   CREATININE 2.13* 1.65*  --   --  1.61* 1.87* 1.70*  CALCIUM 8.1* 8.0*  --   --  7.9* 8.0* 7.7*  MG  --  1.3* 2.4  --   --  1.6*  --    GFR: Estimated Creatinine Clearance: 31.5 mL/min (A) (by C-G formula based on SCr of 1.7 mg/dL (H)). Liver Function Tests: Recent Labs  Lab 10/31/22 0405 11/01/22 0418 11/02/22 0306 11/05/22 2035   AST 114* 146* 84* 28  ALT 37 43 37 22  ALKPHOS 130* 121 117 116  BILITOT 0.8 0.2* 0.3 0.4  PROT 5.8* 5.6* 5.7* 5.7*  ALBUMIN 2.5* 2.4* 2.3* 2.2*   No results for input(s): "LIPASE", "AMYLASE" in the last 168 hours. No results for input(s): "AMMONIA" in the last 168 hours. Coagulation Profile: No results for input(s): "INR", "PROTIME" in the last 168 hours. Cardiac Enzymes: Recent Labs  Lab 10/31/22 0402 11/01/22 0418 11/05/22 2035  CKTOTAL 818* 482* 108   BNP (last 3 results) No results for input(s): "PROBNP" in the last 8760 hours. HbA1C: No results for input(s): "HGBA1C" in the last 72 hours. CBG: No results for input(s): "GLUCAP" in the last 168 hours. Lipid Profile: No results for input(s): "CHOL", "HDL", "LDLCALC", "TRIG", "CHOLHDL", "LDLDIRECT" in the last 72 hours. Thyroid Function Tests: No results for input(s): "TSH", "T4TOTAL", "FREET4", "T3FREE", "THYROIDAB" in the last 72 hours. Anemia Panel: No results for input(s): "VITAMINB12", "FOLATE", "FERRITIN", "TIBC", "IRON", "RETICCTPCT" in the last 72 hours. Sepsis Labs: No results for input(s): "PROCALCITON", "LATICACIDVEN" in the last 168 hours.  Recent Results (from the past 240 hour(s))  MRSA Next Gen by PCR, Nasal     Status: None   Collection Time: 11/06/22  1:29 AM   Specimen: Nasal Mucosa; Nasal Swab  Result Value Ref Range Status   MRSA by PCR Next Gen NOT DETECTED NOT DETECTED Final    Comment: (NOTE) The GeneXpert MRSA Assay (FDA approved for NASAL specimens only), is one component of a comprehensive MRSA colonization surveillance program. It is not intended to diagnose MRSA infection nor to guide or monitor treatment for MRSA infections. Test performance is not FDA approved in patients less than 33 years old. Performed at [facility], [address], [place], [place] [address]          Radiology Studies: CT Head Wo Contrast  Result Date: 11/05/2022 CLINICAL DATA:  Altered mental  status EXAM: CT HEAD WITHOUT CONTRAST TECHNIQUE: Contiguous axial images were obtained from the base of the skull through the vertex without intravenous contrast. RADIATION DOSE REDUCTION: This exam was performed according to the departmental dose-optimization program which includes automated exposure control, adjustment of the mA and/or kV according to patient size and/or use of iterative reconstruction technique. COMPARISON:  10/28/2022 FINDINGS: Brain: There is no mass, hemorrhage or extra-axial collection. There is generalized atrophy without lobar predilection. Hypodensity of the white matter is most commonly associated with chronic microvascular disease. Old left basal ganglia small vessel infarct. Vascular: Atherosclerotic calcification of the internal carotid arteries at the skull  base. No abnormal hyperdensity of the major intracranial arteries or dural venous sinuses. Skull: The visualized skull base, calvarium and extracranial soft tissues are normal. Sinuses/Orbits: Opacification of the right sphenoid sinus with chronic osseous thickening. The other paranasal sinuses are clear. No mastoid or middle ear effusion. Normal orbits. IMPRESSION: 1. No acute intracranial abnormality. 2. Generalized atrophy and findings of chronic microvascular disease. 3. Old left basal ganglia small vessel infarct. 4. Chronic right sphenoid sinusitis. Electronically Signed   By: Deatra Robinson M.D.   On: 11/05/2022 21:36   DG Chest Portable 1 View  Result Date: 11/05/2022 CLINICAL DATA:  Altered mental status and multiple syncopal episodes with generalized weakness. EXAM: PORTABLE CHEST 1 VIEW COMPARISON:  PA Lat chest 10/27/2022 FINDINGS: There is a tangle of overlying monitor wiring. The cardiomediastinal silhouette and vascular pattern are normal. The mediastinum is stable with moderate aortic calcific plaques. The lungs are mildly emphysematous but clear. Thoracic cage is intact. IMPRESSION: No evidence of acute chest  disease. Stable COPD chest with aortic atherosclerosis. Electronically Signed   By: Almira Bar M.D.   On: 11/05/2022 21:08        Scheduled Meds:  enoxaparin (LOVENOX) injection  30 mg Subcutaneous Daily   feeding supplement  237 mL Oral BID BM   Continuous Infusions:  cefTRIAXone (ROCEPHIN)  IV Stopped (11/06/22 0151)   lactated ringers 1,000 mL with potassium chloride 20 mEq infusion 75 mL/hr at 11/06/22 0330     LOS: 1 day    Time spent: 45 minutes spent on chart review, discussion with nursing staff, consultants, updating family and interview/physical exam; more than 50% of that time was spent in counseling and/or coordination of care.    Joseph Art, DO [facility] Available via Epic secure chat 7am-7pm After these hours, please refer to coverage provider listed on [email or web address] 11/06/2022, 11:35 AM

## 2022-11-06 NOTE — Evaluation (Addendum)
Physical Therapy Evaluation Patient Details Name: Michael Bender MRN: [id number] DOB: [birth year] Today's Date: 11/06/2022  History of Present Illness  Michael Bender is a 77 y.o. male admitted 10/5 with syncope and new onset afib with RVR as well as generalized weakness and right index finger swelling and pain. PMH: peripheral vascular disease, hypertension, hyperlipidemia, history of orthostatic hypotension, GERD, recently admitted to the hospital from 10/27/2022 through 11/02/22 due to multiple falls, traumatic rhabdomyolysis, AKI, acute urinary retention with bladder outlet obstruction, BPH, was offered SNF and home health PT but declined  Clinical Impression  Pt admitted with above diagnosis. Pt was able to tolerate bed to chair transfer however was orthostatic which stablized once in chair therefore did some exercises and left the pt in chair.  Pt may benefit from post acute rehab < 3 hours day as pt has incr weakness over last several months. Will follow acutely  Pt currently with functional limitations due to the deficits listed below (see PT Problem List). Pt will benefit from acute skilled PT to increase their independence and safety with mobility to allow discharge.      Orthostatic BPs  Supine 113/59, 85 bpm  Sitting 99/58, 90 bpm  Standing 77/50, 95bpm  Standing after 3 min Would not register    BP once in chair 84/66, 91 bpm; At end of session 96/56, 89 bpm.  Nurse and MD made aware.     If plan is discharge home, recommend the following: A little help with walking and/or transfers;A little help with bathing/dressing/bathroom;Help with stairs or ramp for entrance;Assistance with cooking/housework   Can travel by private vehicle   Yes    Equipment Recommendations Other (comment) (TBA)  Recommendations for Other Services       Functional Status Assessment Patient has had a recent decline in their functional status and demonstrates the ability to make significant  improvements in function in a reasonable and predictable amount of time.     Precautions / Restrictions Precautions Precautions: Fall Precaution Comments: many recent falls Restrictions Weight Bearing Restrictions: No      Mobility  Bed Mobility Overal bed mobility: Needs Assistance Bed Mobility: Supine to Sit     Supine to sit: Contact guard     General bed mobility comments: cues for task initiationn with incr time to come to EOB and pt struggled but was able to get to EOB with cues only and incr time    Transfers Overall transfer level: Needs assistance Equipment used: Rolling walker (2 wheels) Transfers: Sit to/from Stand Sit to Stand: Min assist           General transfer comment: CGA for safety. min assist for steadying due to dizziness.  BP dropped upon standing.    Ambulation/Gait                  Stairs            Wheelchair Mobility     Tilt Bed    Modified Rankin (Stroke Patients Only)       Balance Overall balance assessment: Needs assistance Sitting-balance support: Feet supported, No upper extremity supported Sitting balance-Leahy Scale: Good     Standing balance support: Bilateral upper extremity supported, During functional activity Standing balance-Leahy Scale: Poor Standing balance comment: poor with higher level balance challenges (high marches/foot tap on step, etc) and definite need for RW use and external support  Pertinent Vitals/Pain Pain Assessment Pain Assessment: No/denies pain    Home Living Family/patient expects to be discharged to:: Private residence Living Arrangements: Alone Available Help at Discharge: Family;Available PRN/intermittently;Neighbor Type of Home: House Home Access: Stairs to enter Entrance Stairs-Rails: None Entrance Stairs-Number of Steps: 1   Home Layout: One level Home Equipment: None Additional Comments: on recent admit, chart said pt has  no equipment.  Pt tells this PT he has a walking stick, RW, 3N1, shower seat in walk in shower.    Prior Function Prior Level of Function : Independent/Modified Independent;History of Falls (last six months);Driving             Mobility Comments: reports using walking stick indoors and RW outdoors ADLs Comments: ind     Extremity/Trunk Assessment   Upper Extremity Assessment Upper Extremity Assessment: Defer to OT evaluation    Lower Extremity Assessment Lower Extremity Assessment: Generalized weakness RLE Deficits / Details: incoordination worse R>L with heel-to-shin test; poor motor grading with heavy foot fall in hallway. Pt states sensation WFL RLE Coordination: decreased gross motor;decreased fine motor LLE Deficits / Details: incoordination worse R>L with heel-to-shin test; poor motor grading with heavy foot fall in hallway. Pt states sensation WFL LLE Coordination: decreased gross motor;decreased fine motor    Cervical / Trunk Assessment Cervical / Trunk Assessment: Normal  Communication   Communication Communication: Hearing impairment  Cognition Arousal: Alert Behavior During Therapy: Flat affect Overall Cognitive Status: No family/caregiver present to determine baseline cognitive functioning Area of Impairment: Following commands, Safety/judgement, Problem solving                       Following Commands: Follows one step commands with increased time Safety/Judgement: Decreased awareness of safety, Decreased awareness of deficits   Problem Solving: Slow processing, Requires verbal cues, Requires tactile cues General Comments: Pt lacks insight into deficits, very motivated to continue mobility and go home. Mildly impulsive.        General Comments      Exercises General Exercises - Lower Extremity Ankle Circles/Pumps: AROM, Both, 10 reps, Supine Long Arc Quad: AROM, Both, 10 reps, Seated Hip Flexion/Marching: AROM, Both, 10 reps, Seated    Assessment/Plan    PT Assessment Patient needs continued PT services  PT Problem List Decreased strength;Decreased mobility;Decreased coordination;Decreased safety awareness;Decreased balance;Pain;Decreased activity tolerance;Decreased cognition;Decreased knowledge of precautions       PT Treatment Interventions DME instruction;Therapeutic activities;Gait training;Therapeutic exercise;Patient/family education;Balance training;Stair training;Functional mobility training;Neuromuscular re-education    PT Goals (Current goals can be found in the Care Plan section)  Acute Rehab PT Goals Patient Stated Goal: to go home PT Goal Formulation: With patient Time For Goal Achievement: 11/20/22 Potential to Achieve Goals: Good    Frequency Min 1X/week     Co-evaluation               AM-PAC PT "6 Clicks" Mobility  Outcome Measure Help needed turning from your back to your side while in a flat bed without using bedrails?: A Little Help needed moving from lying on your back to sitting on the side of a flat bed without using bedrails?: A Little Help needed moving to and from a bed to a chair (including a wheelchair)?: A Little Help needed standing up from a chair using your arms (e.g., wheelchair or bedside chair)?: A Little Help needed to walk in hospital room?: A Lot Help needed climbing 3-5 steps with a railing? : Total 6 Click Score: 15  End of Session Equipment Utilized During Treatment: Gait belt Activity Tolerance: Patient limited by fatigue Patient left: in chair;with call bell/phone within reach;with chair alarm set Nurse Communication: Mobility status PT Visit Diagnosis: Unsteadiness on feet (R26.81);Muscle weakness (generalized) (M62.81)    Time: 8938-1017 PT Time Calculation (min) (ACUTE ONLY): 23 min   Charges:   PT Evaluation $PT Eval Moderate Complexity: 1 Mod PT Treatments $Therapeutic Activity: 8-22 mins PT General Charges $$ ACUTE PT VISIT: 1 Visit          Matther Labell M,PT Acute Rehab Services [phone]   Bevelyn Buckles 11/06/2022, 10:17 AM

## 2022-11-06 NOTE — Progress Notes (Signed)
[facility] night cross cover note:   I was notified by RN that the patient is complaining of a headache, that his been unrelieved with dose of prn acetaminophen.  The patient conveys that he typically takes prn oxycodone for similar headaches as an outpatient.  I subsequently resumed his outpatient prn short acting oxycodone.    Newton Pigg, DO Hospitalist

## 2022-11-07 ENCOUNTER — Inpatient Hospital Stay (HOSPITAL_COMMUNITY): Payer: Medicare HMO

## 2022-11-07 DIAGNOSIS — M25432 Effusion, left wrist: Secondary | ICD-10-CM

## 2022-11-07 DIAGNOSIS — M542 Cervicalgia: Secondary | ICD-10-CM | POA: Diagnosis not present

## 2022-11-07 DIAGNOSIS — M00032 Staphylococcal arthritis, left wrist: Secondary | ICD-10-CM

## 2022-11-07 DIAGNOSIS — B9689 Other specified bacterial agents as the cause of diseases classified elsewhere: Secondary | ICD-10-CM

## 2022-11-07 DIAGNOSIS — B9561 Methicillin susceptible Staphylococcus aureus infection as the cause of diseases classified elsewhere: Secondary | ICD-10-CM | POA: Diagnosis not present

## 2022-11-07 DIAGNOSIS — Z515 Encounter for palliative care: Secondary | ICD-10-CM

## 2022-11-07 DIAGNOSIS — L089 Local infection of the skin and subcutaneous tissue, unspecified: Secondary | ICD-10-CM

## 2022-11-07 DIAGNOSIS — Z66 Do not resuscitate: Secondary | ICD-10-CM

## 2022-11-07 DIAGNOSIS — R7881 Bacteremia: Secondary | ICD-10-CM | POA: Diagnosis not present

## 2022-11-07 DIAGNOSIS — R55 Syncope and collapse: Secondary | ICD-10-CM

## 2022-11-07 DIAGNOSIS — R531 Weakness: Secondary | ICD-10-CM

## 2022-11-07 DIAGNOSIS — W19XXXD Unspecified fall, subsequent encounter: Secondary | ICD-10-CM

## 2022-11-07 DIAGNOSIS — S60421A Blister (nonthermal) of left index finger, initial encounter: Secondary | ICD-10-CM

## 2022-11-07 DIAGNOSIS — M25462 Effusion, left knee: Secondary | ICD-10-CM | POA: Diagnosis not present

## 2022-11-07 DIAGNOSIS — M25561 Pain in right knee: Secondary | ICD-10-CM

## 2022-11-07 DIAGNOSIS — M19032 Primary osteoarthritis, left wrist: Secondary | ICD-10-CM

## 2022-11-07 HISTORY — DX: Staphylococcal arthritis, left wrist: M00.032

## 2022-11-07 HISTORY — DX: Local infection of the skin and subcutaneous tissue, unspecified: L08.9

## 2022-11-07 HISTORY — DX: Blister (nonthermal) of left index finger, initial encounter: S60.421A

## 2022-11-07 HISTORY — DX: Methicillin susceptible Staphylococcus aureus infection as the cause of diseases classified elsewhere: B95.61

## 2022-11-07 HISTORY — DX: Other specified bacterial agents as the cause of diseases classified elsewhere: B96.89

## 2022-11-07 HISTORY — DX: Bacteremia: R78.81

## 2022-11-07 LAB — CBC
HCT: 25.4 % — ABNORMAL LOW (ref 39.0–52.0)
Hemoglobin: 8.6 g/dL — ABNORMAL LOW (ref 13.0–17.0)
MCH: 30.6 pg (ref 26.0–34.0)
MCHC: 33.9 g/dL (ref 30.0–36.0)
MCV: 90.4 fL (ref 80.0–100.0)
Platelets: 125 10*3/uL — ABNORMAL LOW (ref 150–400)
RBC: 2.81 MIL/uL — ABNORMAL LOW (ref 4.22–5.81)
RDW: 13.4 % (ref 11.5–15.5)
WBC: 7 10*3/uL (ref 4.0–10.5)
nRBC: 0 % (ref 0.0–0.2)

## 2022-11-07 LAB — ECHOCARDIOGRAM COMPLETE
Area-P 1/2: 5.06 cm2
Height: 71 in
S' Lateral: 4 cm
Weight: 2158.74 [oz_av]

## 2022-11-07 LAB — BASIC METABOLIC PANEL
Anion gap: 10 (ref 5–15)
BUN: 21 mg/dL (ref 8–23)
CO2: 24 mmol/L (ref 22–32)
Calcium: 7.9 mg/dL — ABNORMAL LOW (ref 8.9–10.3)
Chloride: 99 mmol/L (ref 98–111)
Creatinine, Ser: 1.6 mg/dL — ABNORMAL HIGH (ref 0.61–1.24)
GFR, Estimated: 44 mL/min — ABNORMAL LOW (ref >60)
Glucose, Bld: 257 mg/dL — ABNORMAL HIGH (ref 70–99)
Potassium: 3 mmol/L — ABNORMAL LOW (ref 3.5–5.1)
Sodium: 133 mmol/L — ABNORMAL LOW (ref 135–145)

## 2022-11-07 LAB — CORTISOL: Cortisol, Plasma: 16.7 ug/dL

## 2022-11-07 LAB — MAGNESIUM: Magnesium: 1.8 mg/dL (ref 1.7–2.4)

## 2022-11-07 MED ORDER — POTASSIUM CHLORIDE CRYS ER 20 MEQ PO TBCR
40.0000 meq | EXTENDED_RELEASE_TABLET | ORAL | Status: AC
  Administered 2022-11-07 (×2): 40 meq via ORAL
  Filled 2022-11-07 (×3): qty 2

## 2022-11-07 MED ORDER — MAGNESIUM SULFATE 2 GM/50ML IV SOLN
2.0000 g | Freq: Once | INTRAVENOUS | Status: AC
  Administered 2022-11-07: 2 g via INTRAVENOUS
  Filled 2022-11-07: qty 50

## 2022-11-07 MED ORDER — DILTIAZEM HCL-DEXTROSE 125-5 MG/125ML-% IV SOLN (PREMIX)
5.0000 mg/h | INTRAVENOUS | Status: DC
  Administered 2022-11-07: 10 mg/h via INTRAVENOUS
  Administered 2022-11-08: 15 mg/h via INTRAVENOUS
  Administered 2022-11-08: 7.5 mg/h via INTRAVENOUS
  Filled 2022-11-07 (×3): qty 125

## 2022-11-07 MED ORDER — HYDROCODONE-ACETAMINOPHEN 5-325 MG PO TABS: 1.0000 | ORAL_TABLET | ORAL | Status: DC | PRN

## 2022-11-07 MED ORDER — GADOBUTROL 1 MMOL/ML IV SOLN
6.0000 mL | Freq: Once | INTRAVENOUS | Status: AC | PRN
  Administered 2022-11-07: 6 mL via INTRAVENOUS

## 2022-11-07 MED ORDER — DILTIAZEM HCL-DEXTROSE 125-5 MG/125ML-% IV SOLN (PREMIX)
5.0000 mg/h | INTRAVENOUS | Status: DC
  Administered 2022-11-07: 5 mg/h via INTRAVENOUS
  Filled 2022-11-07: qty 125

## 2022-11-07 MED ORDER — METOPROLOL TARTRATE 5 MG/5ML IV SOLN
5.0000 mg | INTRAVENOUS | Status: DC | PRN
  Administered 2022-11-07: 5 mg via INTRAVENOUS
  Filled 2022-11-07: qty 5

## 2022-11-07 MED ORDER — DILTIAZEM LOAD VIA INFUSION
10.0000 mg | Freq: Once | INTRAVENOUS | Status: AC
  Administered 2022-11-07: 10 mg via INTRAVENOUS
  Filled 2022-11-07: qty 10

## 2022-11-07 MED ORDER — OXYCODONE HCL 5 MG PO TABS
10.0000 mg | ORAL_TABLET | Freq: Three times a day (TID) | ORAL | Status: DC | PRN
  Administered 2022-11-07 – 2022-11-09 (×5): 10 mg via ORAL
  Filled 2022-11-07 (×6): qty 2

## 2022-11-07 NOTE — Progress Notes (Signed)
PT Cancellation Note  Patient Details Name: Michael Bender MRN: [id number] DOB: May 23, 1945   Cancelled Treatment:    Reason Eval/Treat Not Completed: Patient at procedure or test/unavailable. Acute PT to follow as able.   [name], PT, DPT Secure Chat Preferred  Rehab Office [phone]   [name] [name] 11/07/2022, 2:01 PM

## 2022-11-07 NOTE — Plan of Care (Signed)
  Problem: Education: Goal: Knowledge of General Education information will improve Description Including pain rating scale, medication(s)/side effects and non-pharmacologic comfort measures Outcome: Progressing   Problem: Clinical Measurements: Goal: Ability to maintain clinical measurements within normal limits will improve Outcome: Progressing Goal: Will remain free from infection Outcome: Progressing   Problem: Nutrition: Goal: Adequate nutrition will be maintained Outcome: Progressing   Problem: Coping: Goal: Level of anxiety will decrease Outcome: Progressing   Problem: Safety: Goal: Ability to remain free from injury will improve Outcome: Progressing   Problem: Skin Integrity: Goal: Risk for impaired skin integrity will decrease Outcome: Progressing

## 2022-11-07 NOTE — Progress Notes (Signed)
Subjective: No new complaints   Antibiotics:  Anti-infectives (From admission, onward)    Start     Dose/Rate Route Frequency Ordered Stop   11/07/22 0100  ceFAZolin (ANCEF) IVPB 2g/100 mL premix        2 g 200 mL/hr over 30 Minutes Intravenous Every 8 hours 11/06/22 1540     11/06/22 0100  cefTRIAXone (ROCEPHIN) 2 g in sodium chloride 0.9 % 100 mL IVPB  Status:  Discontinued        2 g 200 mL/hr over 30 Minutes Intravenous Daily at bedtime 11/06/22 0041 11/06/22 1513       Medications: Scheduled Meds:  enoxaparin (LOVENOX) injection  30 mg Subcutaneous Daily   feeding supplement  237 mL Oral BID BM   potassium chloride  40 mEq Oral Q4H   tamsulosin  0.4 mg Oral QPC supper   Continuous Infusions:   ceFAZolin (ANCEF) IV 2 g (11/07/22 1550)   diltiazem (CARDIZEM) infusion 10 mg/hr (11/07/22 1125)   PRN Meds:.acetaminophen, melatonin, metoprolol tartrate, oxyCODONE, polyethylene glycol, prochlorperazine    Objective: Weight change:   Intake/Output Summary (Last 24 hours) at 11/07/2022 2021 Last data filed at 11/07/2022 1554 Gross per 24 hour  Intake 474 ml  Output 1900 ml  Net -1426 ml   Blood pressure (!) 140/86, pulse (!) 132, temperature 97.9 F (36.6 C), temperature source Axillary, resp. rate 16, height 5\' 11"  (1.803 m), weight 61.2 kg, SpO2 97%. Temp:  [97.9 F (36.6 C)-98.3 F (36.8 C)] 97.9 F (36.6 C) (10/07 1426) Pulse Rate:  [132-135] 132 (10/07 1426) Resp:  [15-18] 16 (10/07 1426) BP: (137-143)/(70-86) 140/86 (10/07 1426) SpO2:  [97 %] 97 % (10/07 1426)  Physical Exam: Physical Exam Constitutional:      Appearance: He is well-developed.  HENT:     Head: Normocephalic and atraumatic.  Eyes:     Conjunctiva/sclera: Conjunctivae normal.  Cardiovascular:     Rate and Rhythm: Normal rate and regular rhythm.     Heart sounds: No murmur heard.    Friction rub present. No gallop.  Pulmonary:     Effort: Pulmonary effort is normal. No  respiratory distress.     Breath sounds: No wheezing.  Abdominal:     General: There is no distension.     Palpations: Abdomen is soft.  Musculoskeletal:     Left wrist: Tenderness present. Decreased range of motion.     Right hand: Swelling and tenderness present.     Cervical back: Normal range of motion and neck supple.     Right knee: Swelling and effusion present. Decreased range of motion. Tenderness present.     Comments: Index finger quite tender  Skin:    General: Skin is warm and dry.     Findings: No erythema or rash.  Neurological:     General: No focal deficit present.     Mental Status: He is alert and oriented to person, place, and time.  Psychiatric:        Mood and Affect: Mood normal.        Behavior: Behavior normal.        Thought Content: Thought content normal.        Judgment: Judgment normal.      CBC:    BMET Recent Labs    11/06/22 0903 11/07/22 0608  NA 132* 133*  K 3.3* 3.0*  CL 101 99  CO2 21* 24  GLUCOSE 295* 257*  BUN 23 21  CREATININE 1.70* 1.60*  CALCIUM 7.7* 7.9*     Liver Panel  Recent Labs    11/05/22 2035  PROT 5.7*  ALBUMIN 2.2*  AST 28  ALT 22  ALKPHOS 116  BILITOT 0.4       Sedimentation Rate No results for input(s): "ESRSEDRATE" in the last 72 hours. C-Reactive Protein No results for input(s): "CRP" in the last 72 hours.  Micro Results: Recent Results (from the past 720 hour(s))  Culture, blood (Routine X 2) w Reflex to ID Panel     Status: Abnormal (Preliminary result)   Collection Time: 11/06/22  1:13 AM   Specimen: BLOOD RIGHT ARM  Result Value Ref Range Status   Specimen Description BLOOD RIGHT ARM  Final   Special Requests   Final    BOTTLES DRAWN AEROBIC AND ANAEROBIC Blood Culture results may not be optimal due to an excessive volume of blood received in culture bottles   Culture  Setup Time   Final    GRAM POSITIVE COCCI IN BOTH AEROBIC AND ANAEROBIC BOTTLES Organism ID to follow CRITICAL  RESULT CALLED TO, READ BACK BY AND VERIFIED WITH: PHARMD JESSICA MILLEN [id number] 1429 BY J RAZZAK, MT    Culture (A)  Final    STAPHYLOCOCCUS AUREUS SUSCEPTIBILITIES TO FOLLOW Performed at [facility], [address], [place], [place] [address]    Report Status PENDING  Incomplete  Blood Culture ID Panel (Reflexed)     Status: Abnormal   Collection Time: 11/06/22  1:13 AM  Result Value Ref Range Status   Enterococcus faecalis NOT DETECTED NOT DETECTED Final   Enterococcus Faecium NOT DETECTED NOT DETECTED Final   Listeria monocytogenes NOT DETECTED NOT DETECTED Final   Staphylococcus species DETECTED (A) NOT DETECTED Final    Comment: CRITICAL RESULT CALLED TO, READ BACK BY AND VERIFIED WITH: PHARMD JESSICA MILLEN [id number] 1429 BY J RAZZAK, MT    Staphylococcus aureus (BCID) DETECTED (A) NOT DETECTED Final    Comment: CRITICAL RESULT CALLED TO, READ BACK BY AND VERIFIED WITH: PHARMD JESSICA MILLEN [id number] 1429 BY J RAZZAK, MT    Staphylococcus epidermidis NOT DETECTED NOT DETECTED Final   Staphylococcus lugdunensis NOT DETECTED NOT DETECTED Final   Streptococcus species NOT DETECTED NOT DETECTED Final   Streptococcus agalactiae NOT DETECTED NOT DETECTED Final   Streptococcus pneumoniae NOT DETECTED NOT DETECTED Final   Streptococcus pyogenes NOT DETECTED NOT DETECTED Final   A.calcoaceticus-baumannii NOT DETECTED NOT DETECTED Final   Bacteroides fragilis NOT DETECTED NOT DETECTED Final   Enterobacterales NOT DETECTED NOT DETECTED Final   Enterobacter cloacae complex NOT DETECTED NOT DETECTED Final   Escherichia coli NOT DETECTED NOT DETECTED Final   Klebsiella aerogenes NOT DETECTED NOT DETECTED Final   Klebsiella oxytoca NOT DETECTED NOT DETECTED Final   Klebsiella pneumoniae NOT DETECTED NOT DETECTED Final   Proteus species NOT DETECTED NOT DETECTED Final   Salmonella species NOT DETECTED NOT DETECTED Final   Serratia marcescens NOT DETECTED NOT DETECTED Final    Haemophilus influenzae NOT DETECTED NOT DETECTED Final   Neisseria meningitidis NOT DETECTED NOT DETECTED Final   Pseudomonas aeruginosa NOT DETECTED NOT DETECTED Final   Stenotrophomonas maltophilia NOT DETECTED NOT DETECTED Final   Candida albicans NOT DETECTED NOT DETECTED Final   Candida auris NOT DETECTED NOT DETECTED Final   Candida glabrata NOT DETECTED NOT DETECTED Final   Candida krusei NOT DETECTED NOT DETECTED Final   Candida parapsilosis NOT DETECTED NOT DETECTED Final   Candida tropicalis NOT  DETECTED NOT DETECTED Final   Cryptococcus neoformans/gattii NOT DETECTED NOT DETECTED Final   Meth resistant mecA/C and MREJ NOT DETECTED NOT DETECTED Final    Comment: Performed at [facility], [address], [place], [place] [address]  Culture, blood (Routine X 2) w Reflex to ID Panel     Status: Abnormal (Preliminary result)   Collection Time: 11/06/22  1:14 AM   Specimen: BLOOD RIGHT ARM  Result Value Ref Range Status   Specimen Description BLOOD RIGHT ARM  Final   Special Requests   Final    BOTTLES DRAWN AEROBIC AND ANAEROBIC Blood Culture results may not be optimal due to an excessive volume of blood received in culture bottles   Culture  Setup Time   Final    GRAM POSITIVE COCCI IN BOTH AEROBIC AND ANAEROBIC BOTTLES Performed at [facility], [address] [place], [place] [address]    Culture STAPHYLOCOCCUS AUREUS (A)  Final   Report Status PENDING  Incomplete  MRSA Next Gen by PCR, Nasal     Status: None   Collection Time: 11/06/22  1:29 AM   Specimen: Nasal Mucosa; Nasal Swab  Result Value Ref Range Status   MRSA by PCR Next Gen NOT DETECTED NOT DETECTED Final    Comment: (NOTE) The GeneXpert MRSA Assay (FDA approved for NASAL specimens only), is one component of a comprehensive MRSA colonization surveillance program. It is not intended to diagnose MRSA infection nor to guide or monitor treatment for MRSA infections. Test performance is not FDA  approved in patients less than 51 years old. Performed at [facility], [address], [place], [place] [address]   SARS Coronavirus 2 by RT PCR (hospital order, performed in [facility] hospital lab) *cepheid single result test* Anterior Nasal Swab     Status: None   Collection Time: 11/06/22  7:33 AM   Specimen: Anterior Nasal Swab  Result Value Ref Range Status   SARS Coronavirus 2 by RT PCR NEGATIVE NEGATIVE Final    Comment: Performed at [facility], [address], [place], [place] [address]  Respiratory (~20 pathogens) panel by PCR     Status: None   Collection Time: 11/06/22  7:34 AM   Specimen: Nasopharyngeal Swab; Respiratory  Result Value Ref Range Status   Adenovirus NOT DETECTED NOT DETECTED Final   Coronavirus 229E NOT DETECTED NOT DETECTED Final    Comment: (NOTE) The Coronavirus on the Respiratory Panel, DOES NOT test for the novel  Coronavirus (2019 nCoV)    Coronavirus HKU1 NOT DETECTED NOT DETECTED Final   Coronavirus NL63 NOT DETECTED NOT DETECTED Final   Coronavirus OC43 NOT DETECTED NOT DETECTED Final   Metapneumovirus NOT DETECTED NOT DETECTED Final   Rhinovirus / Enterovirus NOT DETECTED NOT DETECTED Final   Influenza A NOT DETECTED NOT DETECTED Final   Influenza B NOT DETECTED NOT DETECTED Final   Parainfluenza Virus 1 NOT DETECTED NOT DETECTED Final   Parainfluenza Virus 2 NOT DETECTED NOT DETECTED Final   Parainfluenza Virus 3 NOT DETECTED NOT DETECTED Final   Parainfluenza Virus 4 NOT DETECTED NOT DETECTED Final   Respiratory Syncytial Virus NOT DETECTED NOT DETECTED Final   Bordetella pertussis NOT DETECTED NOT DETECTED Final   Bordetella Parapertussis NOT DETECTED NOT DETECTED Final   Chlamydophila pneumoniae NOT DETECTED NOT DETECTED Final   Mycoplasma pneumoniae NOT DETECTED NOT DETECTED Final    Comment: Performed at [facility], [address],  [place], [place] [address]    Studies/Results: DG Finger Index Right  Result  Date: 11/07/2022 CLINICAL DATA:  Osteomyelitis.  Pain. EXAM: RIGHT INDEX FINGER 2+V COMPARISON:  None Available. FINDINGS: Arthropathy of the distal interphalangeal joint with joint space narrowing and spurring. There may be a small erosion involving the radial aspect of the joint space. No frank bony destruction. The distal phalanx is dorsally subluxed, no frank dislocation. Joint space narrowing with subchondral cysts at the metacarpal phalangeal joint. Mild generalized soft tissue edema. No soft tissue gas or radiopaque foreign body. IMPRESSION: 1. Arthropathy of the distal interphalangeal joint with dorsal subluxation of the distal phalanx. There may be a small erosion involving the radial aspect of the joint space. This could represent infection in the appropriate clinical setting. No frank bony destruction. 2. Mild generalized soft tissue edema. Electronically Signed   By: Narda Rutherford M.D.   On: 11/07/2022 19:41   MR WRIST LEFT W WO CONTRAST  Result Date: 11/07/2022 CLINICAL DATA:  Bacteremia.  Swelling and tenderness of the wrist EXAM: MR OF THE LEFT WRIST WITHOUT AND WITH CONTRAST TECHNIQUE: Multiplanar multisequence MR imaging of the left wrist was performed both before and after the administration of intravenous contrast. CONTRAST:  6mL GADAVIST GADOBUTROL 1 MMOL/ML IV SOLN COMPARISON:  None Available. FINDINGS: Despite efforts by the technologist and patient, motion artifact is present on today's exam and could not be eliminated. This reduces exam sensitivity and specificity. Ligaments: Grossly intact appearing intrinsic interosseous ligaments. No obvious the dorsal or volar ligamentous insufficiency. Triangular fibrocartilage: Grossly intact although partially obscured by field heterogeneity. Tendons: Unremarkable Carpal tunnel/median nerve: Unremarkable Guyon's canal: No impinging lesion identified. Joint/cartilage: Radiocarpal and midcarpal joint effusions. Bones/carpal alignment: Small  degenerative subacute S cortical cystic lesions along the lunate side of the scaphoid (image 9, series 8) and along the proximal pole of the capitate. Advanced degenerative arthropathy of the first carpal metacarpal articulation with prominent subcortical cyst formation and spurring. Mild degenerative arthropathy between the scaphoid and trapezium. Small effusion of the distal radioulnar joint with associated degenerative arthropathy as on image 20 series 9. Other: Dorsal subcutaneous edema along the wrist and extending into the hand. No abscess observed. IMPRESSION: 1. Dorsal subcutaneous edema along the wrist and extending into the hand, without abscess. 2. Radiocarpal and midcarpal joint effusions. Effusion of the distal radioulnar joint. 3. Advanced degenerative arthropathy of the first carpometacarpal articulation. Mild degenerative arthropathy between the scaphoid and trapezium. Electronically Signed   By: Gaylyn Rong M.D.   On: 11/07/2022 19:02   MR KNEE RIGHT W WO CONTRAST  Result Date: 11/07/2022 CLINICAL DATA:  Bacteremia and knee tenderness EXAM: MRI OF THE RIGHT KNEE WITHOUT AND WITH CONTRAST TECHNIQUE: Multiplanar, multisequence MR imaging of the knee was performed before and after the administration of intravenous contrast. CONTRAST:  6mL GADAVIST GADOBUTROL 1 MMOL/ML IV SOLN COMPARISON:  None Available. FINDINGS: Despite efforts by the technologist and patient, motion artifact is present on today's exam and could not be eliminated. This reduces exam sensitivity and specificity. MENISCI Medial meniscus: Degenerative tearing of the posterior horn likely extending into the meniscal root. Mild grade 3 signal in the posterior horn extends to the inferior surface as on image 28 series 7. Lateral meniscus: Prominent expansion of the posterior horn extending towards the meniscal root suggesting prominent degeneration. LIGAMENTS Cruciates:  Unremarkable Collaterals:  Unremarkable CARTILAGE  Patellofemoral: Moderate to severe chondral thinning with marginal spurring. Medial:  Mild chondral thinning. Lateral:  Mild chondral thinning. Joint:  Moderate to large knee effusion. Only thin synovial enhancement. This is unlikely to be a septic joint given the thin nature of the synovial enhancement. Thin medial plica noted. Popliteal Fossa:  Unremarkable Extensor Mechanism: Possible focal central tendinopathy in the distal patellar tendon on image 13 series 7. Bones: 1.0 by 1.4 cm non-fragmented osteochondral lesion versus subcortical stress fracture anteriorly in the medial femoral condyle with surrounding marrow edema as on image 13 series 11 Other: No supplemental non-categorized findings. IMPRESSION: 1. Moderate to large knee effusion, but with only thin synovial enhancement. This is unlikely to be a septic joint given the thin nature of the synovial enhancement. 2. Non-fragmented osteochondral lesion versus subcortical stress fracture anteriorly in the medial femoral condyle with surrounding marrow edema. 3. Degenerative tearing of the posterior horn medial meniscus likely extending into the meniscal root. 4. Prominent expansion of the posterior horn lateral meniscus extending towards the meniscal root suggesting prominent degeneration. 5. Moderate to severe chondral thinning in the patellofemoral compartment with mild chondral thinning in the medial and lateral compartments. 6. Possible focal central tendinopathy in the distal patellar tendon. Electronically Signed   By: Gaylyn Rong M.D.   On: 11/07/2022 18:54   ECHOCARDIOGRAM COMPLETE  Result Date: 11/07/2022    ECHOCARDIOGRAM REPORT   Patient Name:   Michael Bender Date of Exam: 11/07/2022 Medical Rec #:  [id number]        Height:       71.0 in Accession #:    [id number]       Weight:       134.9 lb Date of Birth:  April 17, 1945       BSA:          1.784 m Patient Age:    76 years         BP:           139/76 mmHg Patient Gender: M                 HR:           115 bpm. Exam Location:  Inpatient Procedure: 2D Echo, Cardiac Doppler and Color Doppler Indications:    Syncope R55  History:        Patient has no prior history of Echocardiogram examinations.                 COPD; Risk Factors:Hypertension and Dyslipidemia.  Sonographer:    Harriette Bouillon RDCS Referring Phys: [id number] CAROLE N HALL IMPRESSIONS  1. Left ventricular ejection fraction, by estimation, is 60 to 65%. The left ventricle has normal function. The left ventricle has no regional wall motion abnormalities. Left ventricular diastolic parameters are consistent with Grade II diastolic dysfunction (pseudonormalization).  2. Right ventricular systolic function is normal. The right ventricular size is normal. There is normal pulmonary artery systolic pressure.  3. The mitral valve is normal in structure. No evidence of mitral valve regurgitation. No evidence of mitral stenosis.  4. The aortic valve is tricuspid. Aortic valve regurgitation is not visualized. No aortic stenosis is present.  5. The inferior vena cava is normal in size with greater than 50% respiratory variability, suggesting right atrial pressure of 3 mmHg. FINDINGS  Left Ventricle: Left ventricular ejection fraction, by estimation, is 60 to 65%. The left ventricle has normal function. The left ventricle has no regional wall motion abnormalities. The left ventricular internal cavity size was normal in size. There is  no left ventricular hypertrophy. Left ventricular diastolic parameters are consistent  with Grade II diastolic dysfunction (pseudonormalization). Right Ventricle: The right ventricular size is normal. No increase in right ventricular wall thickness. Right ventricular systolic function is normal. There is normal pulmonary artery systolic pressure. The tricuspid regurgitant velocity is 2.00 m/s, and  with an assumed right atrial pressure of 3 mmHg, the estimated right ventricular systolic pressure is 19.0 mmHg. Left Atrium:  Left atrial size was normal in size. Right Atrium: Right atrial size was normal in size. Pericardium: There is no evidence of pericardial effusion. Mitral Valve: The mitral valve is normal in structure. No evidence of mitral valve regurgitation. No evidence of mitral valve stenosis. Tricuspid Valve: The tricuspid valve is normal in structure. Tricuspid valve regurgitation is trivial. No evidence of tricuspid stenosis. Aortic Valve: The aortic valve is tricuspid. Aortic valve regurgitation is not visualized. No aortic stenosis is present. Pulmonic Valve: The pulmonic valve was normal in structure. Pulmonic valve regurgitation is not visualized. No evidence of pulmonic stenosis. Aorta: The aortic root is normal in size and structure. Venous: The inferior vena cava is normal in size with greater than 50% respiratory variability, suggesting right atrial pressure of 3 mmHg. IAS/Shunts: No atrial level shunt detected by color flow Doppler.  LEFT VENTRICLE PLAX 2D LVIDd:         5.20 cm LVIDs:         4.00 cm LV PW:         1.10 cm LV IVS:        1.10 cm LVOT diam:     2.20 cm LV SV:         55 LV SV Index:   31 LVOT Area:     3.80 cm  IVC IVC diam: 1.20 cm LEFT ATRIUM         Index LA diam:    3.70 cm 2.07 cm/m  AORTIC VALVE LVOT Vmax:   97.90 cm/s LVOT Vmean:  70.600 cm/s LVOT VTI:    0.145 m  AORTA Ao Root diam: 3.10 cm Ao Asc diam:  3.40 cm MITRAL VALVE               TRICUSPID VALVE MV Area (PHT): 5.06 cm    TR Peak grad:   16.0 mmHg MV Decel Time: 150 msec    TR Vmax:        200.00 cm/s MV E velocity: 85.10 cm/s MV A velocity: 46.50 cm/s  SHUNTS MV E/A ratio:  1.83        Systemic VTI:  0.14 m                            Systemic Diam: 2.20 cm Chilton Si MD Electronically signed by Chilton Si MD Signature Date/Time: 11/07/2022/11:51:26 AM    Final    US RENAL  Result Date: 11/06/2022 CLINICAL DATA:  Acute kidney insufficiency EXAM: RENAL / URINARY TRACT ULTRASOUND COMPLETE COMPARISON:  CT 10/27/2022  FINDINGS: Right Kidney: Renal measurements: 11.4 x 6.7 x 5.5 cm = volume: 220.2 mL. No collecting system dilatation. Anechoic structure seen consistent with a cyst towards the lower pole measuring 2.1 cm. Left Kidney: Renal measurements: 10.5 x 6.5 x 5.6 cm = volume: 201.4 mL. Multiple cysts are identified which appears simple as seen on CT. Example measures up to 6 cm. No collecting system dilatation. Bladder: Bladder is distended. The bladder lumen has layering and floating debris. Other: None. IMPRESSION: No collecting system dilatation. Bilateral simple appearing renal cysts by  ultrasound. Please correlate with the prior CT. Distended bladder with luminal floating and layering debris. Please correlate with clinical findings. Electronically Signed   By: Karen Kays M.D.   On: 11/06/2022 15:04   CT Head Wo Contrast  Result Date: 11/05/2022 CLINICAL DATA:  Altered mental status EXAM: CT HEAD WITHOUT CONTRAST TECHNIQUE: Contiguous axial images were obtained from the base of the skull through the vertex without intravenous contrast. RADIATION DOSE REDUCTION: This exam was performed according to the departmental dose-optimization program which includes automated exposure control, adjustment of the mA and/or kV according to patient size and/or use of iterative reconstruction technique. COMPARISON:  10/28/2022 FINDINGS: Brain: There is no mass, hemorrhage or extra-axial collection. There is generalized atrophy without lobar predilection. Hypodensity of the white matter is most commonly associated with chronic microvascular disease. Old left basal ganglia small vessel infarct. Vascular: Atherosclerotic calcification of the internal carotid arteries at the skull base. No abnormal hyperdensity of the major intracranial arteries or dural venous sinuses. Skull: The visualized skull base, calvarium and extracranial soft tissues are normal. Sinuses/Orbits: Opacification of the right sphenoid sinus with chronic osseous  thickening. The other paranasal sinuses are clear. No mastoid or middle ear effusion. Normal orbits. IMPRESSION: 1. No acute intracranial abnormality. 2. Generalized atrophy and findings of chronic microvascular disease. 3. Old left basal ganglia small vessel infarct. 4. Chronic right sphenoid sinusitis. Electronically Signed   By: Deatra Robinson M.D.   On: 11/05/2022 21:36      Assessment/Plan:  INTERVAL HISTORY: Multiple MRIs had been performed but not yet read when I saw the patient   Principal Problem:   MSSA bacteremia Active Problems:   Syncope    Michael Bender is a 77 y.o. male with history of cervical lumbar spine hardware and recent admission for falls weakness and rhabdomyolysis who was discharged to skilled nursing facility but then brought back with sepsis due to MSSA bacteremia.  He had fallen but also headaches was at pain in his left wrist as well as pain in his right knee and index finger.  MRI of the wrist shows some dorsal subcutaneous edema along the wrist extending the hand without an abscess with radiocarpal and midcarpal joint effusions and infusion of the distal radial ulnar joint with advanced degenerative arthropathy.  MRI of the knee shows a large effusion with some thin synovial enhancement with nonfermenting osteochondral lesion versus subcortical stress fracture anteriorly medial femoral condyle with surrounding marrow edema and degenerative tearing of the posterior horn of the medial meniscus  Plain films of the index finger left hand show arthropathy with subluxation of the distal phalanx with small erosion involving the radial aspect of the joint space.  We will continue his cefazolin.  I will repeat blood cultures.  He needs to be seen by orthopedic surgery both for the knee and the hand which may require 2 different surgeons he needs to have the knee aspirated for cell count differential culture and crystals with order of importance being #1 for cell  count and differential  He also similarly needs to have the wrist aspirated for cell count differential culture and crystals.  Plain films of the hand they are not conclusive for infection at this site but I will order MRI tomorrow for this.  2D echocardiogram failed to show evidence of endocarditis.  Will consider obtaining transesophageal echocardiogram though I suspect he has joint infections that would require protracted therapy regardless and so I do not think a TEE is absolutely necessary  in terms of determining duration of antibiotics  I have personally spent 55 minutes involved in face-to-face and non-face-to-face activities for this patient on the day of the visit. Professional time spent includes the following activities: Preparing to see the patient (review of tests), Obtaining and/or reviewing separately obtained history (admission/discharge record), Performing a medically appropriate examination and/or evaluation , Ordering medications/tests/procedures, referring and communicating with other health care professionals, Documenting clinical information in the EMR, Independently interpreting results (not separately reported), Communicating results to the patient/family/caregiver, Counseling and educating the patient/family/caregiver and Care coordination (not separately reported).         LOS: 2 days   Acey Lav 11/07/2022, 8:21 PM

## 2022-11-07 NOTE — NC FL2 (Signed)
[place] MEDICAID FL2 LEVEL OF CARE FORM     IDENTIFICATION  Patient Name: Michael Bender Birthdate: 04/25/1945 Sex: male Admission Date (Current Location): 11/05/2022  [facility] and [place] Number:  Producer, television/film/video and Address:  [facility] [place], [address], [place], [place] [address]      Provider Number: [id number]  Attending Physician Name and Address:  Joseph Art, DO  Relative Name and Phone Number:  Ace Gins [phone]    Current Level of Care: Hospital Recommended Level of Care: Skilled Nursing Facility Prior Approval Number:    Date Approved/Denied:   PASRR Number: [id number]  Discharge Plan: SNF    Current Diagnoses: Patient Active Problem List   Diagnosis Date Noted   MSSA bacteremia 11/07/2022   Syncope 11/05/2022   AKI (acute kidney injury) (HCC) 10/28/2022   Cachexia (HCC) 05/03/2022   Underweight on examination 05/03/2022   Chronic low back pain with bilateral sciatica 05/03/2022   Gynecomastia, male 04/05/2022   History of fusion of cervical spine 03/03/2022   History of lumbar fusion 03/03/2022   High risk medication use 03/03/2022   Intractable pain 03/03/2022   Chronic narcotic dependence (HCC) 12/14/2020   Atherosclerosis of aorta (HCC) 12/14/2020   Duodenal stenosis    HLD (hyperlipidemia) 07/18/2019   COPD (chronic obstructive pulmonary disease) (HCC) 07/18/2019   Iron deficiency anemia    History of CVA (cerebrovascular accident) 02/18/2019   Senile ecchymosis 01/21/2019   PAD (peripheral artery disease) (HCC) 12/11/2018   Insomnia 11/20/2018   Right sided temporal headache 05/22/2018   Esophageal dysphagia 07/15/2011   Bilateral leg pain 05/03/2010   DISC DISEASE, LUMBAR 01/01/2009   CONSTIPATION, CHRONIC 10/07/2008   GERD 04/04/2007   Pulmonary emphysema (HCC) 03/26/2007    Orientation RESPIRATION BLADDER Height & Weight     Self, Time, Situation, Place  Normal Incontinent Weight: 134 lb 14.7  oz (61.2 kg) Height:  5\' 11"  (180.3 cm)  BEHAVIORAL SYMPTOMS/MOOD NEUROLOGICAL BOWEL NUTRITION STATUS      Incontinent Diet (Please see discharge summary)  AMBULATORY STATUS COMMUNICATION OF NEEDS Skin   Limited Assist Verbally Other (Comment) (Abrasion,arm,leg,elbow,Bil.,Ecchymosis,arm,elbow,Bil.,Wound/Incision LDAs)                       Personal Care Assistance Level of Assistance  Bathing, Feeding, Dressing Bathing Assistance: Limited assistance Feeding assistance: Limited assistance Dressing Assistance: Limited assistance     Functional Limitations Info  Sight, Hearing, Speech Sight Info: Impaired Hearing Info: Impaired Speech Info: Adequate    SPECIAL CARE FACTORS FREQUENCY  PT (By licensed PT), OT (By licensed OT)     PT Frequency: 5x min weekly OT Frequency: 5x min weekly            Contractures Contractures Info: Not present    Additional Factors Info  Code Status, Allergies Code Status Info: FULL Allergies Info: Nubain (nalbuphine Hcl)           Current Medications (11/07/2022):  This is the current hospital active medication list Current Facility-Administered Medications  Medication Dose Route Frequency Provider Last Rate Last Admin   acetaminophen (TYLENOL) tablet 650 mg  650 mg Oral Q6H PRN Dow Adolph N, DO   650 mg at 11/07/22 1445   ceFAZolin (ANCEF) IVPB 2g/100 mL premix  2 g Intravenous Q8H Ilda Basset, RPH 200 mL/hr at 11/07/22 0603 2 g at 11/07/22 0603   diltiazem (CARDIZEM) 125 mg in dextrose 5% 125 mL (1 mg/mL) infusion  5-15 mg/hr Intravenous Titrated Marlin Canary U, DO 10 mL/hr at 11/07/22 1125 10 mg/hr at 11/07/22 1125   enoxaparin (LOVENOX) injection 30 mg  30 mg Subcutaneous Daily Dow Adolph N, DO   30 mg at 11/07/22 0754   feeding supplement (ENSURE ENLIVE / ENSURE PLUS) liquid 237 mL  237 mL Oral BID BM Dow Adolph N, DO   237 mL at 11/07/22 1115   magnesium sulfate IVPB 2 g 50 mL  2 g Intravenous Once Marlin Canary U, DO  66.7 mL/hr at 11/07/22 1449 2 g at 11/07/22 1449   melatonin tablet 5 mg  5 mg Oral QHS PRN Dow Adolph N, DO       metoprolol tartrate (LOPRESSOR) injection 5 mg  5 mg Intravenous Q4H PRN Howerter, Justin B, DO   5 mg at 11/07/22 0454   oxyCODONE (Oxy IR/ROXICODONE) immediate release tablet 10 mg  10 mg Oral Q8H PRN Marlin Canary U, DO   10 mg at 11/07/22 1035   polyethylene glycol (MIRALAX / GLYCOLAX) packet 17 g  17 g Oral Daily PRN Dow Adolph N, DO       potassium chloride SA (KLOR-CON M) CR tablet 40 mEq  40 mEq Oral Q4H Vann, Jessica U, DO   40 mEq at 11/07/22 1445   prochlorperazine (COMPAZINE) injection 5 mg  5 mg Intravenous Q6H PRN Dow Adolph N, DO       tamsulosin (FLOMAX) capsule 0.4 mg  0.4 mg Oral QPC supper Vann, Jessica U, DO   0.4 mg at 11/06/22 1726     Discharge Medications: Please see discharge summary for a list of discharge medications.  Relevant Imaging Results:  Relevant Lab Results:   Additional Information [id number]  Delilah Shan, LCSWA

## 2022-11-07 NOTE — Consult Note (Signed)
Consultation Note Date: 11/07/2022   Patient Name: Michael Bender  DOB: 03-10-1945  MRN: [id number]  Age / Sex: 77 y.o., male  PCP: Lula Olszewski, MD Referring Physician: Joseph Art, DO  Reason for Consultation: Establishing goals of care  HPI/Patient Profile: 77 y.o. male   admitted on 11/05/2022 with past medical history significant for peripheral vascular disease, hypertension, hyperlipidemia, history of orthostatic hypotension, GERD, PAD, COPD, history of cervical/lumbar hardware , chonic pain (Dr. Vear Clock)  recently admitted to the hospital from 10/27/2022 through 11/02/22 due to multiple falls, traumatic rhabdomyolysis, AKI, acute urinary retention with bladder outlet obstruction, BPH, was offered SNF and home health PT but declined.  Admitted from home with recurrent episodes of syncope, witnessed by family members.  Associated with generalized weakness.  Found to have bacteremia.    Patient and family face treatment option decisions, advanced directive decisions and anticipatory care needs.  Clinical Assessment and Goals of Care:  This NP Lorinda Creed reviewed medical records, received report from team, assessed the patient and then meet at the patient's bedside with his son Michael Bender to discuss diagnosis, prognosis, GOC, EOL wishes disposition and options.   Concept of Palliative Care was introduced as specialized medical care for people and their families living with serious illness.  If focuses on providing relief from the symptoms and stress of a serious illness.  The goal is to improve quality of life for both the patient and the family.  Values and goals of care important to patient and family were attempted to be elicited.  Patient currently lives alone with his dog "Buddy".  Although he was having physical and functional decline over the past several years he was maintaining at home  alone.  He has 2 sons who are supportive.    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    Education offered on his multiple comorbidities today we spoke specifically to his sepsis due to MRSA bacteremia, ID suspects deep infection requiring 6 weeks of antibiotics.  Education offered on the importance of functional status.  Encouraged work with therapies and individual self-motivated movements, being as independent for himself as possible here in the hospital.   Created space and opportunity for patient  and family to explore thoughts and feelings regarding current medical situation.  Both patient and his son verbalized an understanding of the seriousness of patient's current medical situation.  Patient speaks to the importance of independence and quality of life.   A  discussion was had today regarding advanced directives.  Concepts specific to code status, artifical feeding and hydration, continued IV antibiotics and rehospitalization was had.  The difference between a aggressive medical intervention path  and a palliative comfort care path for this patient at this time was had.     MOST form introduced and reviewed, hard copy left   Natural trajectory and expectations at EOL were discussed.  Questions and concerns addressed.  Patient  encouraged to call with questions or concerns.     PMT will continue to support holistically.          No documented H POA or advance care planning documents.   Patient states that both of his sons understand his wishes and would act in his best behalf if he is unable to make decisions for himself.       SUMMARY OF RECOMMENDATIONS    Code Status/Advance Care Planning: DNR-placed today   Symptom Management:  Pain-continue with current medication for pain.  Patient is treated by Dr. Vear Clock at the pain clinic.  Currently patient's pain is adequately controlled  Palliative Prophylaxis:   Delirium Protocol and Frequent Pain Assessment  Additional Recommendations (Limitations, Scope, Preferences): Avoid Hospitalization and No Artificial Feeding  Psycho-social/Spiritual:  Desire for further Chaplaincy support:no Additional Recommendations: Education on Hospice  Prognosis:  Unable to determine  Discharge Planning: Skilled Nursing Facility for rehab with Palliative care service follow-up      Primary Diagnoses: Present on Admission:  Syncope  MSSA bacteremia   I have reviewed the medical record, interviewed the patient and family, and examined the patient. The following aspects are pertinent.  Past Medical History:  Diagnosis Date   Arthritis    Back   Blood transfusion    as a child   Chronic back pain    COPD (chronic obstructive pulmonary disease) (HCC)    Disturbance of skin sensation 05/22/2018   Essential hypertension 03/05/2007   Qualifier: Diagnosis of   By: Tawanna Cooler MD, Eugenio Hoes      Gastric erosion    Gastritis and gastroduodenitis    Gastrointestinal hemorrhage with melena 11/20/2018   GIB (gastrointestinal bleeding) 07/18/2019   Headache(784.0)    last one 6 months ago   Hemorrhoids, internal    History of duodenal ulcer    History of lumbar fusion 03/03/2022   RE-OPERATIVE DIAGNOSIS:  lumbar stenosis synovial cyst lumbar spondylosis spondylolisthesis lumbar radiculoapthy L4/5   PROCEDURE:  Procedure(s): POSTERIOR LUMBAR FUSION 1 LEVEL with resection of synovial cyst     History of upper gastrointestinal bleeding 03/03/2022   Duodenal ulcer 2021 Dr. [name]   HLD (hyperlipidemia)    Hypertension    Loss of weight    Wt Readings from Last 10 Encounters:  04/05/22  146 lb (66.2 kg)  03/03/22  143 lb 9.6 oz (65.1 kg)  07/14/21  153 lb (69.4 kg)  12/14/20  147 lb 12.8 oz (67 kg)  11/13/19  154 lb (69.9 kg)  09/16/19  146 lb (66.2 kg)  08/06/19  146 lb (66.2 kg)  07/19/19  144 lb 13.5 oz (65.7 kg)  07/17/19  148 lb (67.1 kg)  07/09/19  147 lb  9.6 oz (67 kg)         Neck rigidity    post cervical fusion   Nocturia    PAIN, CHRONIC NEC 10/06/2006   Qualifier: Diagnosis of   By: Tawanna Cooler RN, Alvino Chapel       Pneumonia    Prostate disease    S/P cervical spinal fusion 03/03/2022   With persistent cervical pain and palpable screws Led to disability History of attempt to dig furrow in skull to fix it Last surgery 1992   Social History   Socioeconomic History   Marital status: Divorced    Spouse name: Not on file   Number of children: 2   Years of education: Not on file   Highest education level: Not on file  Occupational History  Occupation: Disabled  Tobacco Use   Smoking status: Some Days    Current packs/day: 0.00    Types: Cigarettes    Start date: 12/12/1978    Last attempt to quit: 12/12/2018    Years since quitting: 3.9   Smokeless tobacco: Never  Vaping Use   Vaping status: Never Used  Substance and Sexual Activity   Alcohol use: Not Currently    Alcohol/week: 2.0 standard drinks of alcohol    Types: 2 Shots of liquor per week   Drug use: No   Sexual activity: Not on file  Other Topics Concern   Not on file  Social History Narrative   Patient is divorced 2 sons he is a retired Engineer, 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  He drinks about a liter of caffeinated drinks a day past smoker no drug use no alcohol.   Social Determinants of Health   Financial Resource Strain: Low Risk  (10/13/2022)   Overall Financial Resource Strain (CARDIA)    Difficulty of Paying Living Expenses: Not hard at all  Food Insecurity: No Food Insecurity (11/06/2022)   Hunger Vital Sign    Worried About Running Out of Food in the Last Year: Never true    Ran Out of Food in the Last Year: Never true  Transportation Needs: No Transportation Needs (11/06/2022)   PRAPARE - Administrator, Civil Service (Medical): No    Lack of Transportation (Non-Medical): No  Physical Activity: Insufficiently  Active (10/13/2022)   Exercise Vital Sign    Days of Exercise per Week: 3 days    Minutes of Exercise per Session: 30 min  Stress: No Stress Concern Present (10/13/2022)   [employer] of Occupational Health - Occupational Stress Questionnaire    Feeling of Stress : Not at all  Social Connections: Socially Isolated (10/13/2022)   Social Connection and Isolation Panel [NHANES]    Frequency of Communication with Friends and Family: Twice a week    Frequency of Social Gatherings with Friends and Family: More than three times a week    Attends Religious Services: Never    Database administrator or Organizations: No    Attends Banker Meetings: Never    Marital Status: Widowed   Family History  Problem Relation Age of Onset   Heart disease Mother    Heart attack Mother 67   Pancreatic cancer Father 52   Pancreatic cancer Brother    Anesthesia problems Neg Hx    Colon cancer Neg Hx    Liver cancer Neg Hx    Stomach cancer Neg Hx    Esophageal cancer Neg Hx    Rectal cancer Neg Hx    Scheduled Meds:  enoxaparin (LOVENOX) injection  30 mg Subcutaneous Daily   feeding supplement  237 mL Oral BID BM   potassium chloride  40 mEq Oral Q4H   tamsulosin  0.4 mg Oral QPC supper   Continuous Infusions:   ceFAZolin (ANCEF) IV 2 g (11/07/22 0603)   diltiazem (CARDIZEM) infusion 10 mg/hr (11/07/22 1125)   PRN Meds:.acetaminophen, melatonin, metoprolol tartrate, oxyCODONE, polyethylene glycol, prochlorperazine Medications Prior to Admission:  Prior to Admission medications   Medication Sig Start Date End Date Taking? Authorizing Provider  amitriptyline (ELAVIL) 50 MG tablet Take 50 mg by mouth in the morning, at noon, and at bedtime. 11/04/22  Yes [provider]  amLODipine (NORVASC) 10 MG tablet Take 1 tablet (10  mg total) by mouth daily. 11/03/22   Glade Lloyd, MD  cyclobenzaprine (FLEXERIL) 10 MG tablet Take 10 mg by mouth 3 (three) times daily as needed. 10/18/22    [provider]  Oxycodone HCl 10 MG TABS Take 1 tablet by mouth every 8 (eight) hours as needed.    [provider]  pantoprazole (PROTONIX) 40 MG tablet TAKE 1 TABLET BY MOUTH EVERY DAY BEFORE BREAKFAST Patient taking differently: Take 40 mg by mouth daily. 10/20/22   Lula Olszewski, MD  polyethylene glycol (MIRALAX / GLYCOLAX) 17 g packet Take 17 g by mouth daily as needed for moderate constipation. 11/02/22   Glade Lloyd, MD  potassium chloride SA (KLOR-CON M) 20 MEQ tablet Take 1 tablet (20 mEq total) by mouth 2 (two) times daily. 11/02/22   Glade Lloyd, MD  rosuvastatin (CRESTOR) 40 MG tablet Take 40 mg by mouth daily. 11/04/22   [provider]  SUMAtriptan (IMITREX) 50 MG tablet Take 50 mg by mouth every 2 (two) hours as needed for migraine or headache. 10/19/22   [provider]  tamsulosin (FLOMAX) 0.4 MG CAPS capsule Take 1 capsule (0.4 mg total) by mouth daily after supper. 11/02/22   Glade Lloyd, MD   Allergies  Allergen Reactions   Nubain [Nalbuphine Hcl]     Muscle contraction   Review of Systems  Musculoskeletal:  Positive for neck stiffness.       Complaint of right knee pain, left wrist pain and neck pain  Neurological:  Positive for weakness.    Physical Exam Constitutional:      Appearance: He is underweight. He is ill-appearing.  Cardiovascular:     Rate and Rhythm: Tachycardia present.  Pulmonary:     Effort: Pulmonary effort is normal.  Musculoskeletal:     Comments: Generalized weakness and muscle atrophy   Skin:    General: Skin is warm and dry.  Neurological:     Mental Status: He is alert.     Vital Signs: BP 137/86   Pulse (!) 135   Temp 98.3 F (36.8 C) (Oral)   Resp 18   Ht 5\' 11"  (1.803 m)   Wt 61.2 kg   SpO2 97%   BMI 18.82 kg/m  Pain Scale: 0-10   Pain Score: 8    SpO2: SpO2: 97 % O2 Device:SpO2: 97 % O2 Flow Rate: .   IO: Intake/output summary:  Intake/Output Summary (Last 24 hours)  at 11/07/2022 1352 Last data filed at 11/07/2022 4696 Gross per 24 hour  Intake 235 ml  Output 1900 ml  Net -1665 ml    LBM: Last BM Date :  (Pt UTA) Baseline Weight: Weight: 61.2 kg Most recent weight: Weight: 61.2 kg     Palliative Assessment/Data: 40 %      Time: 90 minutes, discussed with Dr. Zenaida Niece and treatment team via secure chat  Signed by: Lorinda Creed, NP   Please contact Palliative Medicine Team phone at [phone] for questions and concerns.  For individual provider: See Loretha Stapler

## 2022-11-07 NOTE — Progress Notes (Signed)
TRH night cross cover note:   I was notified by RN of the patient's atrial fibrillation with RVR, with sustained heart rates in the 140s.  No report of any new or worsening symptoms associated with this. It appears that patient was in A fib RVR earlier in this hospitalization. Fresh set of VS currently pending. I've ordered STAT EKG and prn iv lopressor for sustained heart rates greater than 130.      Newton Pigg, DO Hospitalist

## 2022-11-07 NOTE — TOC Initial Note (Signed)
Transition of Care [name]) - Initial/Assessment Note    Patient Details  Name: Michael Bender MRN: [id number] Date of Birth: 04/17/1945  [facility] CM/SW Contact:    Delilah Shan, LCSWA Phone Number: 11/07/2022, 3:01 PM  Clinical Narrative:                  CSW received consult for possible SNF placement at time of discharge. CSW spoke with patient at bedside regarding PT recommendation of SNF placement at time of discharge. Patient expressed understanding of PT recommendation and is agreeable to SNF placement at time of discharge.. CSW discussed insurance authorization process and will provide Medicare SNF ratings list with accepted SNF bed offers when available. No further questions reported at this time. CSW to continue to follow and assist with discharge planning needs.   Expected Discharge Plan: Skilled Nursing Facility Barriers to Discharge: Continued Medical Work up   Patient Goals and CMS Choice Patient states their goals for this hospitalization and ongoing recovery are:: SNF CMS Medicare.gov Compare Post Acute Care list provided to:: Patient Choice offered to / list presented to : Patient      Expected Discharge Plan and Services In-house Referral: Clinical Social Work     Living arrangements for the past 2 months: Single Family Home                                      Prior Living Arrangements/Services Living arrangements for the past 2 months: Single Family Home Lives with:: Self Patient language and need for interpreter reviewed:: Yes Do you feel safe going back to the place where you live?: No   SNF  Need for Family Participation in Patient Care: Yes (Comment) Care giver support system in place?: Yes (comment)   Criminal Activity/Legal Involvement Pertinent to Current Situation/Hospitalization: No - Comment as needed  Activities of Daily Living   ADL Screening (condition at time of admission) Independently performs ADLs?: Yes  (appropriate for developmental age) Is the patient deaf or have difficulty hearing?: Yes Does the patient have difficulty seeing, even when wearing glasses/contacts?: No Does the patient have difficulty concentrating, remembering, or making decisions?: Yes  Permission Sought/Granted Permission sought to share information with : Case Manager, Family Supports, Magazine features editor Permission granted to share information with : Yes, Verbal Permission Granted  Share Information with NAME: Ace Gins  Permission granted to share info w AGENCY: SNF  Permission granted to share info w Relationship: son  Permission granted to share info w Contact Information: Ace Gins [phone]  Emotional Assessment Appearance:: Appears stated age Attitude/Demeanor/Rapport: Gracious Affect (typically observed): Calm Orientation: : Oriented to Self, Oriented to Place, Oriented to  Time, Oriented to Situation Alcohol / Substance Use: Not Applicable Psych Involvement: No (comment)  Admission diagnosis:  Syncope and collapse [R55] Syncope [R55] Atrial fibrillation, unspecified type [facility]) [I48.91] Patient Active Problem List   Diagnosis Date Noted   MSSA bacteremia 11/07/2022   Syncope 11/05/2022   AKI (acute kidney injury) (HCC) 10/28/2022   Cachexia (HCC) 05/03/2022   Underweight on examination 05/03/2022   Chronic low back pain with bilateral sciatica 05/03/2022   Gynecomastia, male 04/05/2022   History of fusion of cervical spine 03/03/2022   History of lumbar fusion 03/03/2022   High risk medication use 03/03/2022   Intractable pain 03/03/2022   Chronic narcotic dependence (HCC) 12/14/2020   Atherosclerosis of aorta (HCC) 12/14/2020   Duodenal  stenosis    HLD (hyperlipidemia) 07/18/2019   COPD (chronic obstructive pulmonary disease) (HCC) 07/18/2019   Iron deficiency anemia    History of CVA (cerebrovascular accident) 02/18/2019   Senile ecchymosis 01/21/2019   PAD (peripheral artery disease)  (HCC) 12/11/2018   Insomnia 11/20/2018   Right sided temporal headache 05/22/2018   Esophageal dysphagia 07/15/2011   Bilateral leg pain 05/03/2010   DISC DISEASE, LUMBAR 01/01/2009   CONSTIPATION, CHRONIC 10/07/2008   GERD 04/04/2007   Pulmonary emphysema (HCC) 03/26/2007   PCP:  Lula Olszewski, MD Pharmacy:   [facility] [phone] - [place], [place] - [address] [place] [address] Phone: [phone] Fax: [phone]  [facility] [address] [place] - [address] [place] [address] Phone: [phone] Fax: [phone]     Social Determinants of Health (SDOH) Social History: SDOH Screenings   Food Insecurity: No Food Insecurity (11/06/2022)  Housing: Low Risk  (11/06/2022)  Transportation Needs: No Transportation Needs (11/06/2022)  Utilities: Not At Risk (11/06/2022)  Alcohol Screen: Low Risk  (07/14/2021)  Depression (PHQ2-9): Low Risk  (10/13/2022)  Financial Resource Strain: Low Risk  (10/13/2022)  Physical Activity: Insufficiently Active (10/13/2022)  Social Connections: Socially Isolated (10/13/2022)  Stress: No Stress Concern Present (10/13/2022)  Tobacco Use: High Risk (11/05/2022)  Health Literacy: Inadequate Health Literacy (10/13/2022)   SDOH Interventions:     Readmission Risk Interventions     No data to display

## 2022-11-07 NOTE — Progress Notes (Addendum)
PROGRESS NOTE    [name]  WUJ:[id number] DOB: 08-03-1945 DOA: 11/05/2022 PCP: Lula Olszewski, MD    Brief Narrative:  Michael Bender is a 77 y.o. male with medical history significant for peripheral vascular disease, hypertension, hyperlipidemia, history of orthostatic hypotension, GERD, recently admitted to the hospital from 10/27/2022 through 11/02/22 due to multiple falls, traumatic rhabdomyolysis, AKI, acute urinary retention with bladder outlet obstruction, BPH, was offered SNF and home health PT but declined, who presents from home with recurrent episodes of syncope, witnessed by family members.  Associated with generalized weakness.  Found to have bacteremia.     Assessment and Plan: Syncope due to orthostatic vital signs -PT/OT assessment -IV fluid hydration -suspected due to infection   Bacteremia- staph aureus -ID consulted -echo pending -IV abx -MRI wrist/knee   Elevated troponin, suspect demand ischemia No evidence of acute ischemia on 12 EKG    Newly diagnosed atrial fibrillation with RVR Admission 12-lead EKG with A-fib rate of 121 CHA2DS2-VASc score of 4. - 2D echo pending -IV cardizem -would avoid blood thinner due to falls and orthostatics-- reconsider if less falls   Hypokalemia -repleted   Hypomagnesemia -repleted   AKI, suspect prerenal in setting of dehydration from poor oral intake Serum creatinine appears to be 1.0 GFR greater than 60 Presented with creatinine of 1.87 with GFR of 37 IVF -renal U/S with distended bladder-- may need foley  -flomax -PVR//bladder scan   Physical debility PT/OT assessment Fall precautions.  Currently agreeable for SNF  DVT prophylaxis: Place TED hose Start: 11/06/22 1024 enoxaparin (LOVENOX) injection 30 mg Start: 11/06/22 1000    Code Status: Full Code Family Communication: son at bedside  Disposition Plan:  Level of care: Progressive Status is: Inpatient Remains inpatient appropriate      Consultants:  Palliative care   Subjective: C/o headache  Objective: Vitals:   11/06/22 1147 11/06/22 2134 11/07/22 0704 11/07/22 0742  BP: 117/63 (!) 143/70 137/86   Pulse: 83  (!) 135   Resp:  15 18   Temp: 98.2 F (36.8 C) 98.3 F (36.8 C)  98.3 F (36.8 C)  TempSrc: Oral Oral  Oral  SpO2:   97%   Weight:      Height:        Intake/Output Summary (Last 24 hours) at 11/07/2022 1053 Last data filed at 11/07/2022 0918 Gross per 24 hour  Intake --  Output 1200 ml  Net -1200 ml   Filed Weights   11/05/22 1949  Weight: 61.2 kg    Examination:   General: Appearance:    Thin male in no acute distress     Lungs:     respirations unlabored  Heart:    Tachycardic. Normal rhythm. No murmurs, rubs, or gallops.   MS:   All extremities are intact.   Neurologic:   Awake, alert       Data Reviewed: I have personally reviewed following labs and imaging studies  CBC: Recent Labs  Lab 11/05/22 2035 11/06/22 0903 11/07/22 0608  WBC 9.3 9.0 7.0  NEUTROABS 8.6*  --   --   HGB 9.3* 9.0* 8.6*  HCT 28.4* 26.8* 25.4*  MCV 92.5 92.4 90.4  PLT 146* 134* 125*   Basic Metabolic Panel: Recent Labs  Lab 11/01/22 0418 11/01/22 1137 11/01/22 1811 11/02/22 0306 11/05/22 2035 11/06/22 0903 11/07/22 0608  NA 139  --   --  138 134* 132* 133*  K 2.6* 2.7* 3.6 3.1* 2.6* 3.3* 3.0*  CL  98  --   --  104 97* 101 99  CO2 28  --   --  25 21* 21* 24  GLUCOSE 128*  --   --  195* 222* 295* 257*  BUN 11  --   --  9 23 23 21   CREATININE 1.65*  --   --  1.61* 1.87* 1.70* 1.60*  CALCIUM 8.0*  --   --  7.9* 8.0* 7.7* 7.9*  MG 1.3* 2.4  --   --  1.6*  --  1.8   GFR: Estimated Creatinine Clearance: 33.5 mL/min (A) (by C-G formula based on SCr of 1.6 mg/dL (H)). Liver Function Tests: Recent Labs  Lab 11/01/22 0418 11/02/22 0306 11/05/22 2035  AST 146* 84* 28  ALT 43 37 22  ALKPHOS 121 117 116  BILITOT 0.2* 0.3 0.4  PROT 5.6* 5.7* 5.7*  ALBUMIN 2.4* 2.3* 2.2*   No results  for input(s): "LIPASE", "AMYLASE" in the last 168 hours. No results for input(s): "AMMONIA" in the last 168 hours. Coagulation Profile: No results for input(s): "INR", "PROTIME" in the last 168 hours. Cardiac Enzymes: Recent Labs  Lab 11/01/22 0418 11/05/22 2035  CKTOTAL 482* 108   BNP (last 3 results) No results for input(s): "PROBNP" in the last 8760 hours. HbA1C: No results for input(s): "HGBA1C" in the last 72 hours. CBG: No results for input(s): "GLUCAP" in the last 168 hours. Lipid Profile: No results for input(s): "CHOL", "HDL", "LDLCALC", "TRIG", "CHOLHDL", "LDLDIRECT" in the last 72 hours. Thyroid Function Tests: No results for input(s): "TSH", "T4TOTAL", "FREET4", "T3FREE", "THYROIDAB" in the last 72 hours. Anemia Panel: No results for input(s): "VITAMINB12", "FOLATE", "FERRITIN", "TIBC", "IRON", "RETICCTPCT" in the last 72 hours. Sepsis Labs: No results for input(s): "PROCALCITON", "LATICACIDVEN" in the last 168 hours.  Recent Results (from the past 240 hour(s))  Culture, blood (Routine X 2) w Reflex to ID Panel     Status: Abnormal (Preliminary result)   Collection Time: 11/06/22  1:13 AM   Specimen: BLOOD RIGHT ARM  Result Value Ref Range Status   Specimen Description BLOOD RIGHT ARM  Final   Special Requests   Final    BOTTLES DRAWN AEROBIC AND ANAEROBIC Blood Culture results may not be optimal due to an excessive volume of blood received in culture bottles   Culture  Setup Time   Final    GRAM POSITIVE COCCI IN BOTH AEROBIC AND ANAEROBIC BOTTLES Organism ID to follow CRITICAL RESULT CALLED TO, READ BACK BY AND VERIFIED WITH: PHARMD Jaesean Litzau MILLEN [id number] 1429 BY J RAZZAK, [place]    Culture (A)  Final    STAPHYLOCOCCUS AUREUS SUSCEPTIBILITIES TO FOLLOW Performed at [facility], [address] [place], [place] [address]    Report Status PENDING  Incomplete  Blood Culture ID Panel (Reflexed)     Status: Abnormal   Collection Time: 11/06/22  1:13 AM   Result Value Ref Range Status   Enterococcus faecalis NOT DETECTED NOT DETECTED Final   Enterococcus Faecium NOT DETECTED NOT DETECTED Final   Listeria monocytogenes NOT DETECTED NOT DETECTED Final   Staphylococcus species DETECTED (A) NOT DETECTED Final    Comment: CRITICAL RESULT CALLED TO, READ BACK BY AND VERIFIED WITH: PHARMD Trevion Hoben MILLEN [phone] 1429 BY J RAZZAK, MT    Staphylococcus aureus (BCID) DETECTED (A) NOT DETECTED Final    Comment: CRITICAL RESULT CALLED TO, READ BACK BY AND VERIFIED WITH: PHARMD Laniece Hornbaker MILLEN [phone] 1429 BY J RAZZAK, MT    Staphylococcus epidermidis  NOT DETECTED NOT DETECTED Final   Staphylococcus lugdunensis NOT DETECTED NOT DETECTED Final   Streptococcus species NOT DETECTED NOT DETECTED Final   Streptococcus agalactiae NOT DETECTED NOT DETECTED Final   Streptococcus pneumoniae NOT DETECTED NOT DETECTED Final   Streptococcus pyogenes NOT DETECTED NOT DETECTED Final   A.calcoaceticus-baumannii NOT DETECTED NOT DETECTED Final   Bacteroides fragilis NOT DETECTED NOT DETECTED Final   Enterobacterales NOT DETECTED NOT DETECTED Final   Enterobacter cloacae complex NOT DETECTED NOT DETECTED Final   Escherichia coli NOT DETECTED NOT DETECTED Final   Klebsiella aerogenes NOT DETECTED NOT DETECTED Final   Klebsiella oxytoca NOT DETECTED NOT DETECTED Final   Klebsiella pneumoniae NOT DETECTED NOT DETECTED Final   Proteus species NOT DETECTED NOT DETECTED Final   Salmonella species NOT DETECTED NOT DETECTED Final   Serratia marcescens NOT DETECTED NOT DETECTED Final   Haemophilus influenzae NOT DETECTED NOT DETECTED Final   Neisseria meningitidis NOT DETECTED NOT DETECTED Final   Pseudomonas aeruginosa NOT DETECTED NOT DETECTED Final   Stenotrophomonas maltophilia NOT DETECTED NOT DETECTED Final   Candida albicans NOT DETECTED NOT DETECTED Final   Candida auris NOT DETECTED NOT DETECTED Final   Candida glabrata NOT DETECTED NOT DETECTED Final    Candida krusei NOT DETECTED NOT DETECTED Final   Candida parapsilosis NOT DETECTED NOT DETECTED Final   Candida tropicalis NOT DETECTED NOT DETECTED Final   Cryptococcus neoformans/gattii NOT DETECTED NOT DETECTED Final   Meth resistant mecA/C and MREJ NOT DETECTED NOT DETECTED Final    Comment: Performed at [facility], [address], [place], [place] [address]  Culture, blood (Routine X 2) w Reflex to ID Panel     Status: Abnormal (Preliminary result)   Collection Time: 11/06/22  1:14 AM   Specimen: BLOOD RIGHT ARM  Result Value Ref Range Status   Specimen Description BLOOD RIGHT ARM  Final   Special Requests   Final    BOTTLES DRAWN AEROBIC AND ANAEROBIC Blood Culture results may not be optimal due to an excessive volume of blood received in culture bottles   Culture  Setup Time   Final    GRAM POSITIVE COCCI IN BOTH AEROBIC AND ANAEROBIC BOTTLES Performed at [facility], [address], [place], [place] [address]    Culture STAPHYLOCOCCUS AUREUS (A)  Final   Report Status PENDING  Incomplete  MRSA Next Gen by PCR, Nasal     Status: None   Collection Time: 11/06/22  1:29 AM   Specimen: Nasal Mucosa; Nasal Swab  Result Value Ref Range Status   MRSA by PCR Next Gen NOT DETECTED NOT DETECTED Final    Comment: (NOTE) The GeneXpert MRSA Assay (FDA approved for NASAL specimens only), is one component of a comprehensive MRSA colonization surveillance program. It is not intended to diagnose MRSA infection nor to guide or monitor treatment for MRSA infections. Test performance is not FDA approved in patients less than 4 years old. Performed at [facility], [address], [place], [place] [address]   SARS Coronavirus 2 by RT PCR (hospital order, performed in [facility] hospital lab) *cepheid single result test* Anterior Nasal Swab     Status: None   Collection Time: 11/06/22  7:33 AM   Specimen: Anterior Nasal Swab  Result Value Ref Range Status   SARS  Coronavirus 2 by RT PCR NEGATIVE NEGATIVE Final    Comment: Performed at [facility], [address] [place], [place] [address]  Respiratory (~20 pathogens) panel by PCR     Status: None   Collection Time: 11/06/22  7:34 AM   Specimen: Nasopharyngeal Swab; Respiratory  Result Value Ref Range Status   Adenovirus NOT DETECTED NOT DETECTED Final   Coronavirus 229E NOT DETECTED NOT DETECTED Final    Comment: (NOTE) The Coronavirus on the Respiratory Panel, DOES NOT test for the novel  Coronavirus (2019 nCoV)    Coronavirus HKU1 NOT DETECTED NOT DETECTED Final   Coronavirus NL63 NOT DETECTED NOT DETECTED Final   Coronavirus OC43 NOT DETECTED NOT DETECTED Final   Metapneumovirus NOT DETECTED NOT DETECTED Final   Rhinovirus / Enterovirus NOT DETECTED NOT DETECTED Final   Influenza A NOT DETECTED NOT DETECTED Final   Influenza B NOT DETECTED NOT DETECTED Final   Parainfluenza Virus 1 NOT DETECTED NOT DETECTED Final   Parainfluenza Virus 2 NOT DETECTED NOT DETECTED Final   Parainfluenza Virus 3 NOT DETECTED NOT DETECTED Final   Parainfluenza Virus 4 NOT DETECTED NOT DETECTED Final   Respiratory Syncytial Virus NOT DETECTED NOT DETECTED Final   Bordetella pertussis NOT DETECTED NOT DETECTED Final   Bordetella Parapertussis NOT DETECTED NOT DETECTED Final   Chlamydophila pneumoniae NOT DETECTED NOT DETECTED Final   Mycoplasma pneumoniae NOT DETECTED NOT DETECTED Final    Comment: Performed at [facility], [address] [place], [place] [address]         Radiology Studies: US RENAL  Result Date: 11/06/2022 CLINICAL DATA:  Acute kidney insufficiency EXAM: RENAL / URINARY TRACT ULTRASOUND COMPLETE COMPARISON:  CT 10/27/2022 FINDINGS: Right Kidney: Renal measurements: 11.4 x 6.7 x 5.5 cm = volume: 220.2 mL. No collecting system dilatation. Anechoic structure seen consistent with a cyst towards the lower pole measuring 2.1 cm. Left Kidney: Renal measurements: 10.5 x 6.5 x 5.6  cm = volume: 201.4 mL. Multiple cysts are identified which appears simple as seen on CT. Example measures up to 6 cm. No collecting system dilatation. Bladder: Bladder is distended. The bladder lumen has layering and floating debris. Other: None. IMPRESSION: No collecting system dilatation. Bilateral simple appearing renal cysts by ultrasound. Please correlate with the prior CT. Distended bladder with luminal floating and layering debris. Please correlate with clinical findings. Electronically Signed   By: Karen Kays M.D.   On: 11/06/2022 15:04   CT Head Wo Contrast  Result Date: 11/05/2022 CLINICAL DATA:  Altered mental status EXAM: CT HEAD WITHOUT CONTRAST TECHNIQUE: Contiguous axial images were obtained from the base of the skull through the vertex without intravenous contrast. RADIATION DOSE REDUCTION: This exam was performed according to the departmental dose-optimization program which includes automated exposure control, adjustment of the mA and/or kV according to patient size and/or use of iterative reconstruction technique. COMPARISON:  10/28/2022 FINDINGS: Brain: There is no mass, hemorrhage or extra-axial collection. There is generalized atrophy without lobar predilection. Hypodensity of the white matter is most commonly associated with chronic microvascular disease. Old left basal ganglia small vessel infarct. Vascular: Atherosclerotic calcification of the internal carotid arteries at the skull base. No abnormal hyperdensity of the major intracranial arteries or dural venous sinuses. Skull: The visualized skull base, calvarium and extracranial soft tissues are normal. Sinuses/Orbits: Opacification of the right sphenoid sinus with chronic osseous thickening. The other paranasal sinuses are clear. No mastoid or middle ear effusion. Normal orbits. IMPRESSION: 1. No acute intracranial abnormality. 2. Generalized atrophy and findings of chronic microvascular disease. 3. Old left basal ganglia small  vessel infarct. 4. Chronic right sphenoid  sinusitis. Electronically Signed   By: Deatra Robinson M.D.   On: 11/05/2022 21:36   DG Chest Portable 1 View  Result Date: 11/05/2022 CLINICAL DATA:  Altered mental status and multiple syncopal episodes with generalized weakness. EXAM: PORTABLE CHEST 1 VIEW COMPARISON:  PA Lat chest 10/27/2022 FINDINGS: There is a tangle of overlying monitor wiring. The cardiomediastinal silhouette and vascular pattern are normal. The mediastinum is stable with moderate aortic calcific plaques. The lungs are mildly emphysematous but clear. Thoracic cage is intact. IMPRESSION: No evidence of acute chest disease. Stable COPD chest with aortic atherosclerosis. Electronically Signed   By: Almira Bar M.D.   On: 11/05/2022 21:08        Scheduled Meds:  enoxaparin (LOVENOX) injection  30 mg Subcutaneous Daily   feeding supplement  237 mL Oral BID BM   tamsulosin  0.4 mg Oral QPC supper   Continuous Infusions:   ceFAZolin (ANCEF) IV 2 g (11/07/22 0603)   diltiazem (CARDIZEM) infusion       LOS: 2 days    Time spent: 45 minutes spent on chart review, discussion with nursing staff, consultants, updating family and interview/physical exam; more than 50% of that time was spent in counseling and/or coordination of care.    Joseph Art, DO [facility] Available via Epic secure chat 7am-7pm After these hours, please refer to coverage provider listed on [email or web address] 11/07/2022, 10:53 AM

## 2022-11-07 NOTE — Plan of Care (Signed)
Will acute to monitor

## 2022-11-08 ENCOUNTER — Other Ambulatory Visit (HOSPITAL_COMMUNITY): Payer: Self-pay

## 2022-11-08 DIAGNOSIS — M79645 Pain in left finger(s): Secondary | ICD-10-CM

## 2022-11-08 DIAGNOSIS — R7881 Bacteremia: Secondary | ICD-10-CM | POA: Diagnosis not present

## 2022-11-08 DIAGNOSIS — M542 Cervicalgia: Secondary | ICD-10-CM

## 2022-11-08 DIAGNOSIS — M00061 Staphylococcal arthritis, right knee: Secondary | ICD-10-CM

## 2022-11-08 DIAGNOSIS — M009 Pyogenic arthritis, unspecified: Secondary | ICD-10-CM | POA: Diagnosis not present

## 2022-11-08 DIAGNOSIS — M00032 Staphylococcal arthritis, left wrist: Secondary | ICD-10-CM | POA: Diagnosis not present

## 2022-11-08 DIAGNOSIS — B9561 Methicillin susceptible Staphylococcus aureus infection as the cause of diseases classified elsewhere: Secondary | ICD-10-CM | POA: Diagnosis not present

## 2022-11-08 DIAGNOSIS — M25432 Effusion, left wrist: Secondary | ICD-10-CM | POA: Diagnosis not present

## 2022-11-08 DIAGNOSIS — M25462 Effusion, left knee: Secondary | ICD-10-CM | POA: Diagnosis not present

## 2022-11-08 HISTORY — DX: Pyogenic arthritis, unspecified: M00.9

## 2022-11-08 HISTORY — DX: Pain in left finger(s): M79.645

## 2022-11-08 HISTORY — DX: Staphylococcal arthritis, right knee: M00.061

## 2022-11-08 LAB — BASIC METABOLIC PANEL
Anion gap: 13 (ref 5–15)
BUN: 18 mg/dL (ref 8–23)
CO2: 26 mmol/L (ref 22–32)
Calcium: 8.2 mg/dL — ABNORMAL LOW (ref 8.9–10.3)
Chloride: 95 mmol/L — ABNORMAL LOW (ref 98–111)
Creatinine, Ser: 1.32 mg/dL — ABNORMAL HIGH (ref 0.61–1.24)
GFR, Estimated: 56 mL/min — ABNORMAL LOW (ref >60)
Glucose, Bld: 321 mg/dL — ABNORMAL HIGH (ref 70–99)
Potassium: 2.9 mmol/L — ABNORMAL LOW (ref 3.5–5.1)
Sodium: 134 mmol/L — ABNORMAL LOW (ref 135–145)

## 2022-11-08 LAB — CULTURE, BLOOD (ROUTINE X 2)

## 2022-11-08 LAB — GLUCOSE, CAPILLARY
Glucose-Capillary: 473 mg/dL — ABNORMAL HIGH (ref 70–99)
Glucose-Capillary: 430 mg/dL — ABNORMAL HIGH (ref 70–99)
Glucose-Capillary: 331 mg/dL — ABNORMAL HIGH (ref 70–99)
Glucose-Capillary: 211 mg/dL — ABNORMAL HIGH (ref 70–99)

## 2022-11-08 LAB — GRAM STAIN

## 2022-11-08 LAB — SYNOVIAL CELL COUNT + DIFF, W/ CRYSTALS
Crystals, Fluid: NONE SEEN
Eosinophils-Synovial: 0 % (ref 0–1)
Lymphocytes-Synovial Fld: 0 % (ref 0–20)
Monocyte-Macrophage-Synovial Fluid: 6 % — ABNORMAL LOW (ref 50–90)
Neutrophil, Synovial: 94 % — ABNORMAL HIGH (ref 0–25)
WBC, Synovial: 8500 /mm3 — ABNORMAL HIGH (ref 0–200)

## 2022-11-08 LAB — HEMOGLOBIN A1C
Hgb A1c MFr Bld: 6.5 % — ABNORMAL HIGH (ref 4.8–5.6)
Mean Plasma Glucose: 139.85 mg/dL

## 2022-11-08 LAB — MAGNESIUM: Magnesium: 1.8 mg/dL (ref 1.7–2.4)

## 2022-11-08 LAB — GLUCOSE, RANDOM: Glucose, Bld: 405 mg/dL — ABNORMAL HIGH (ref 70–99)

## 2022-11-08 MED ORDER — INSULIN ASPART 100 UNIT/ML IJ SOLN
0.0000 [IU] | Freq: Three times a day (TID) | INTRAMUSCULAR | Status: DC
  Administered 2022-11-08 – 2022-11-09 (×3): 15 [IU] via SUBCUTANEOUS
  Administered 2022-11-10: 2 [IU] via SUBCUTANEOUS
  Administered 2022-11-10: 8 [IU] via SUBCUTANEOUS
  Administered 2022-11-11: 3 [IU] via SUBCUTANEOUS
  Administered 2022-11-11 – 2022-11-12 (×3): 2 [IU] via SUBCUTANEOUS
  Administered 2022-11-12 – 2022-11-13 (×2): 5 [IU] via SUBCUTANEOUS
  Administered 2022-11-13 – 2022-11-14 (×2): 3 [IU] via SUBCUTANEOUS

## 2022-11-08 MED ORDER — AMITRIPTYLINE HCL 25 MG PO TABS
25.0000 mg | ORAL_TABLET | Freq: Every day | ORAL | Status: DC
  Administered 2022-11-08 – 2022-11-13 (×6): 25 mg via ORAL
  Filled 2022-11-08 (×6): qty 1

## 2022-11-08 MED ORDER — POTASSIUM CHLORIDE 10 MEQ/100ML IV SOLN
10.0000 meq | Freq: Once | INTRAVENOUS | Status: AC
  Administered 2022-11-08: 10 meq via INTRAVENOUS
  Filled 2022-11-08: qty 100

## 2022-11-08 MED ORDER — METOPROLOL TARTRATE 25 MG PO TABS
25.0000 mg | ORAL_TABLET | Freq: Two times a day (BID) | ORAL | Status: DC
  Administered 2022-11-08 – 2022-11-14 (×13): 25 mg via ORAL
  Filled 2022-11-08 (×13): qty 1

## 2022-11-08 MED ORDER — BUPIVACAINE HCL (PF) 0.5 % IJ SOLN
10.0000 mL | Freq: Once | INTRAMUSCULAR | Status: AC
  Administered 2022-11-08: 10 mL
  Filled 2022-11-08: qty 10

## 2022-11-08 MED ORDER — LIDOCAINE HCL 1 % IJ SOLN
5.0000 mL | Freq: Once | INTRAMUSCULAR | Status: AC
  Administered 2022-11-08: 5 mL via INTRADERMAL
  Filled 2022-11-08 (×3): qty 5

## 2022-11-08 MED ORDER — POTASSIUM CHLORIDE CRYS ER 20 MEQ PO TBCR
40.0000 meq | EXTENDED_RELEASE_TABLET | ORAL | Status: AC
  Administered 2022-11-08 (×4): 40 meq via ORAL
  Filled 2022-11-08 (×4): qty 2

## 2022-11-08 MED ORDER — INSULIN ASPART 100 UNIT/ML IJ SOLN
15.0000 [IU] | Freq: Once | INTRAMUSCULAR | Status: AC
  Administered 2022-11-08: 15 [IU] via SUBCUTANEOUS

## 2022-11-08 MED ORDER — INSULIN ASPART 100 UNIT/ML IJ SOLN
0.0000 [IU] | Freq: Every day | INTRAMUSCULAR | Status: DC
  Administered 2022-11-08 – 2022-11-13 (×4): 2 [IU] via SUBCUTANEOUS

## 2022-11-08 MED ORDER — INSULIN GLARGINE-YFGN 100 UNIT/ML ~~LOC~~ SOLN
10.0000 [IU] | Freq: Every day | SUBCUTANEOUS | Status: DC
  Administered 2022-11-08: 10 [IU] via SUBCUTANEOUS
  Filled 2022-11-08 (×3): qty 0.1

## 2022-11-08 MED ORDER — POTASSIUM CHLORIDE 10 MEQ/100ML IV SOLN
10.0000 meq | INTRAVENOUS | Status: AC
  Administered 2022-11-08 (×3): 10 meq via INTRAVENOUS
  Filled 2022-11-08 (×3): qty 100

## 2022-11-08 MED ORDER — MAGNESIUM SULFATE 2 GM/50ML IV SOLN
2.0000 g | Freq: Once | INTRAVENOUS | Status: AC
  Administered 2022-11-08: 2 g via INTRAVENOUS
  Filled 2022-11-08: qty 50

## 2022-11-08 NOTE — TOC Progression Note (Addendum)
Transition of Care [facility]) - Progression Note    Patient Details  Name: Michael Bender MRN: [id number] Date of Birth: 07-17-1945  [facility] CM/SW Contact  Delilah Shan, LCSWA Phone Number: 11/08/2022, 12:21 PM  Clinical Narrative:     CSW spoke with patient at bedside. CSW provided patient with medicare compare SNF ratings list with accepted SNF bed offers. Patient reviewed and request for CSW to follow up with his son Michael Bender to help with his SNF choice. CSW called patients son Michael Bender. CSW provided SNF bed offers to patients son. Patients son is going to discuss with his brother and give CSW a call back with SNF choice.CSW will continue to follow.  Update- CSW received call back from patients son Michael Bender. Michael Bender accepted SNF bed offer with [facility]. CSW following to start insurance authorization closer to patient being medically ready for dc.   Expected Discharge Plan: Skilled Nursing Facility Barriers to Discharge: Continued Medical Work up  Expected Discharge Plan and Services In-house Referral: Clinical Social Work     Living arrangements for the past 2 months: Single Family Home                                       Social Determinants of Health (SDOH) Interventions SDOH Screenings   Food Insecurity: No Food Insecurity (11/06/2022)  Housing: Low Risk  (11/06/2022)  Transportation Needs: No Transportation Needs (11/06/2022)  Utilities: Not At Risk (11/06/2022)  Alcohol Screen: Low Risk  (07/14/2021)  Depression (PHQ2-9): Low Risk  (10/13/2022)  Financial Resource Strain: Low Risk  (10/13/2022)  Physical Activity: Insufficiently Active (10/13/2022)  Social Connections: Socially Isolated (10/13/2022)  Stress: No Stress Concern Present (10/13/2022)  Tobacco Use: High Risk (11/05/2022)  Health Literacy: Inadequate Health Literacy (10/13/2022)    Readmission Risk Interventions     No data to display

## 2022-11-08 NOTE — Procedures (Signed)
Procedure: Right knee aspiration and injection   Indication: Right knee effusion(s)   Surgeon: Charma Igo, PA-C   Assist: None   Anesthesia: Topical anesthetic   EBL: None   Complications: None   Findings: After risks/benefits explained patient desires to undergo procedure. Consent obtained and time out performed. The right knee was sterilely prepped and aspirated. 20ml thin bloody fluid obtained. 6ml 0.5% Marcaine instilled. Pt tolerated the procedure well.       Michael Caldron, PA-C Orthopedic Surgery [phone]

## 2022-11-08 NOTE — Progress Notes (Signed)
Orthopedic Tech Progress Note Patient Details:  Michael Bender 1945-05-27 [id number] Pt already had a wrist brace in the room.  Patient ID: DEAUNDRA KUTZER, male   DOB: [birth year], 77 y.o.   MRN: [id number]  Blase Mess 11/08/2022, 1:01 PM

## 2022-11-08 NOTE — Progress Notes (Signed)
     ORTHO PROGRESS NOTE:  Bedside aspiration performed given delay with IR procedure, was not on the schedule for today per nursing.   Objective:   VITALS:   Vitals:   11/08/22 0356 11/08/22 0457 11/08/22 0557 11/08/22 1140  BP: (!) 148/88 120/71 (!) 147/86 134/83  Pulse: (!) 120   87  Resp: 16 15 16 16   Temp: 98.9 F (37.2 C)   98.4 F (36.9 C)  TempSrc: Oral   Oral  SpO2: 96%   92%  Weight:      Height:        Left wrist: Ongoing pain at left wrist, able to perform gentle ROM, pain at extremes    Procedure: Left wrist prepped in standard sterile fashion.  2 cc of lidocaine plain was utilized for anesthetic purposes.  Once appropriate analgesia was achieved, 18-gauge needle was inserted into the radiocarpal joint with a dorsal approach.  Approximately 3cc of cloudy fluid was aspirated and sent for analysis.  Sterile dressings were applied followed by application of the wrist splint.  Patient had notable improvement in wrist range of motion after aspiration.    Lab Results  Component Value Date   WBC 7.0 11/07/2022   HGB 8.6 (L) 11/07/2022   HCT 25.4 (L) 11/07/2022   MCV 90.4 11/07/2022   PLT 125 (L) 11/07/2022     Assessment/Plan: 77 year old male rule out left wrist septic arthritis  Patient just recently had something by mouth, will plan to make him n.p.o. at midnight pending results of the left wrist aspiration  I explained that should the analysis of the fluid indicate infection, patient would be indicated for left wrist washout urgently.  Discussed with operating room, this will be performed urgently at the earliest availability based on the fluid analysis.     Samuella Cota, MD [place], [facility] 11/08/2022, 7:31 PM

## 2022-11-08 NOTE — Consult Note (Signed)
Reason for Consult:Polyarthralgia Referring Physician: Marlin Canary Time called: 9147 Time at bedside: 0948   Michael Bender is an 77 y.o. male.  HPI: Michael Bender was admitted 2d ago with multiple falls and weakness. He had a similar admission from 9/26-10/2. He was noted to have MSSA blood cultures on admission. He admits to left wrist and right knee pain that are pretty severe and getting worse. This has been going on for about a week and he doesn't think it stemmed from any of his falls. He denies prior hx/o similar. ID in involved and requested aspirations from the knee and wrist. He is RHD.  Past Medical History:  Diagnosis Date   Arthritis    Back   Blood transfusion    as a child   Chronic back pain    COPD (chronic obstructive pulmonary disease) (HCC)    Disturbance of skin sensation 05/22/2018   Essential hypertension 03/05/2007   Qualifier: Diagnosis of   By: Tawanna Cooler MD, Eugenio Hoes      Gastric erosion    Gastritis and gastroduodenitis    Gastrointestinal hemorrhage with melena 11/20/2018   GIB (gastrointestinal bleeding) 07/18/2019   Headache(784.0)    last one 6 months ago   Hemorrhoids, internal    History of duodenal ulcer    History of lumbar fusion 03/03/2022   RE-OPERATIVE DIAGNOSIS:  lumbar stenosis synovial cyst lumbar spondylosis spondylolisthesis lumbar radiculoapthy L4/5   PROCEDURE:  Procedure(s): POSTERIOR LUMBAR FUSION 1 LEVEL with resection of synovial cyst     History of upper gastrointestinal bleeding 03/03/2022   Duodenal ulcer 2021 Dr. [name]   HLD (hyperlipidemia)    Hypertension    Loss of weight    Wt Readings from Last 10 Encounters:  04/05/22  146 lb (66.2 kg)  03/03/22  143 lb 9.6 oz (65.1 kg)  07/14/21  153 lb (69.4 kg)  12/14/20  147 lb 12.8 oz (67 kg)  11/13/19  154 lb (69.9 kg)  09/16/19  146 lb (66.2 kg)  08/06/19  146 lb (66.2 kg)  07/19/19  144 lb 13.5 oz (65.7 kg)  07/17/19  148 lb (67.1 kg)  07/09/19  147 lb 9.6 oz (67 kg)         Neck  rigidity    post cervical fusion   Nocturia    PAIN, CHRONIC NEC 10/06/2006   Qualifier: Diagnosis of   By: Tawanna Cooler RN, Alvino Chapel       Pneumonia    Prostate disease    S/P cervical spinal fusion 03/03/2022   With persistent cervical pain and palpable screws Led to disability History of attempt to dig furrow in skull to fix it Last surgery 1992    Past Surgical History:  Procedure Laterality Date   BIOPSY  07/19/2019   Procedure: BIOPSY;  Surgeon: Shellia Cleverly, DO;  Location: [facility];  Service: Gastroenterology;;   CERVICAL FUSION  1992   C2/C 3  four surgeries   COLONOSCOPY     ESOPHAGOGASTRODUODENOSCOPY  07/20/2011   Procedure: ESOPHAGOGASTRODUODENOSCOPY (EGD);  Surgeon: [name], MD;  Location: [facility];  Service: Endoscopy;  Laterality: N/A;   ESOPHAGOGASTRODUODENOSCOPY (EGD) WITH PROPOFOL N/A 07/19/2019   Procedure: ESOPHAGOGASTRODUODENOSCOPY (EGD) WITH PROPOFOL;  Surgeon: Shellia Cleverly, DO;  Location: [facility];  Service: Gastroenterology;  Laterality: N/A;   SAVORY DILATION  07/20/2011   Procedure: SAVORY DILATION;  Surgeon: [name], MD;  Location: [facility];  Service: Endoscopy;  Laterality: N/A;   SPINAL FUSION  05/06/11  Family History  Problem Relation Age of Onset   Heart disease Mother    Heart attack Mother 45   Pancreatic cancer Father 84   Pancreatic cancer Brother    Anesthesia problems Neg Hx    Colon cancer Neg Hx    Liver cancer Neg Hx    Stomach cancer Neg Hx    Esophageal cancer Neg Hx    Rectal cancer Neg Hx     Social History:  reports that he has been smoking cigarettes. He started smoking about 43 years ago. He has never used smokeless tobacco. He reports that he does not currently use alcohol after a past usage of about 2.0 standard drinks of alcohol per week. He reports that he does not use drugs.  Allergies:  Allergies  Allergen Reactions   Nubain [Nalbuphine Hcl]     Muscle contraction    Medications: I  have reviewed the patient's current medications.  Results for orders placed or performed during the hospital encounter of 11/05/22 (from the past 48 hour(s))  CBC     Status: Abnormal   Collection Time: 11/07/22  6:08 AM  Result Value Ref Range   WBC 7.0 4.0 - 10.5 K/uL   RBC 2.81 (L) 4.22 - 5.81 MIL/uL   Hemoglobin 8.6 (L) 13.0 - 17.0 g/dL   HCT 72.5 (L) 36.6 - 44.0 %   MCV 90.4 80.0 - 100.0 fL   MCH 30.6 26.0 - 34.0 pg   MCHC 33.9 30.0 - 36.0 g/dL   RDW 34.7 42.5 - 95.6 %   Platelets 125 (L) 150 - 400 K/uL    Comment: REPEATED TO VERIFY   nRBC 0.0 0.0 - 0.2 %    Comment: Performed at [facility], [address] [place], [place] [address]  Basic metabolic panel     Status: Abnormal   Collection Time: 11/07/22  6:08 AM  Result Value Ref Range   Sodium 133 (L) 135 - 145 mmol/L   Potassium 3.0 (L) 3.5 - 5.1 mmol/L   Chloride 99 98 - 111 mmol/L   CO2 24 22 - 32 mmol/L   Glucose, Bld 257 (H) 70 - 99 mg/dL    Comment: Glucose reference range applies only to samples taken after fasting for at least 8 hours.   BUN 21 8 - 23 mg/dL   Creatinine, Ser 4.33 (H) 0.61 - 1.24 mg/dL   Calcium 7.9 (L) 8.9 - 10.3 mg/dL   GFR, Estimated 44 (L) >60 mL/min    Comment: (NOTE) Calculated using the CKD-EPI Creatinine Equation (2021)    Anion gap 10 5 - 15    Comment: Performed at [facility], [address] [place], [place] [address]  Cortisol     Status: None   Collection Time: 11/07/22  6:08 AM  Result Value Ref Range   Cortisol, Plasma 16.7 ug/dL    Comment: (NOTE) AM    6.7 - 22.6 ug/dL PM   <84.1       ug/dL Performed at [facility], [address], [place], [place] [address]   Magnesium     Status: None   Collection Time: 11/07/22  6:08 AM  Result Value Ref Range   Magnesium 1.8 1.7 - 2.4 mg/dL    Comment: Performed at [facility], [address], [place], [place] [address]  Basic metabolic panel     Status: Abnormal   Collection Time: 11/08/22  7:32 AM   Result Value Ref  Range   Sodium 134 (L) 135 - 145 mmol/L   Potassium 2.9 (L) 3.5 - 5.1 mmol/L   Chloride 95 (L) 98 - 111 mmol/L   CO2 26 22 - 32 mmol/L   Glucose, Bld 321 (H) 70 - 99 mg/dL    Comment: Glucose reference range applies only to samples taken after fasting for at least 8 hours.   BUN 18 8 - 23 mg/dL   Creatinine, Ser 1.61 (H) 0.61 - 1.24 mg/dL   Calcium 8.2 (L) 8.9 - 10.3 mg/dL   GFR, Estimated 56 (L) >60 mL/min    Comment: (NOTE) Calculated using the CKD-EPI Creatinine Equation (2021)    Anion gap 13 5 - 15    Comment: Performed at [facility], [address] [place], [place] [address]  Magnesium     Status: None   Collection Time: 11/08/22  8:49 AM  Result Value Ref Range   Magnesium 1.8 1.7 - 2.4 mg/dL    Comment: Performed at [facility], [address], [place], [place] [address]    DG Finger Index Right  Result Date: 11/07/2022 CLINICAL DATA:  Osteomyelitis.  Pain. EXAM: RIGHT INDEX FINGER 2+V COMPARISON:  None Available. FINDINGS: Arthropathy of the distal interphalangeal joint with joint space narrowing and spurring. There may be a small erosion involving the radial aspect of the joint space. No frank bony destruction. The distal phalanx is dorsally subluxed, no frank dislocation. Joint space narrowing with subchondral cysts at the metacarpal phalangeal joint. Mild generalized soft tissue edema. No soft tissue gas or radiopaque foreign body. IMPRESSION: 1. Arthropathy of the distal interphalangeal joint with dorsal subluxation of the distal phalanx. There may be a small erosion involving the radial aspect of the joint space. This could represent infection in the appropriate clinical setting. No frank bony destruction. 2. Mild generalized soft tissue edema. Electronically Signed   By: Narda Rutherford M.D.   On: 11/07/2022 19:41   MR WRIST LEFT W WO CONTRAST  Result Date: 11/07/2022 CLINICAL DATA:  Bacteremia.  Swelling and tenderness of the wrist  EXAM: MR OF THE LEFT WRIST WITHOUT AND WITH CONTRAST TECHNIQUE: Multiplanar multisequence MR imaging of the left wrist was performed both before and after the administration of intravenous contrast. CONTRAST:  6mL GADAVIST GADOBUTROL 1 MMOL/ML IV SOLN COMPARISON:  None Available. FINDINGS: Despite efforts by the technologist and patient, motion artifact is present on today's exam and could not be eliminated. This reduces exam sensitivity and specificity. Ligaments: Grossly intact appearing intrinsic interosseous ligaments. No obvious the dorsal or volar ligamentous insufficiency. Triangular fibrocartilage: Grossly intact although partially obscured by field heterogeneity. Tendons: Unremarkable Carpal tunnel/median nerve: Unremarkable Guyon's canal: No impinging lesion identified. Joint/cartilage: Radiocarpal and midcarpal joint effusions. Bones/carpal alignment: Small degenerative subacute S cortical cystic lesions along the lunate side of the scaphoid (image 9, series 8) and along the proximal pole of the capitate. Advanced degenerative arthropathy of the first carpal metacarpal articulation with prominent subcortical cyst formation and spurring. Mild degenerative arthropathy between the scaphoid and trapezium. Small effusion of the distal radioulnar joint with associated degenerative arthropathy as on image 20 series 9. Other: Dorsal subcutaneous edema along the wrist and extending into the hand. No abscess observed. IMPRESSION: 1. Dorsal subcutaneous edema along the wrist and extending into the hand, without abscess. 2. Radiocarpal and midcarpal joint effusions. Effusion of the distal radioulnar joint. 3. Advanced degenerative arthropathy of the first carpometacarpal articulation. Mild degenerative arthropathy between the scaphoid and trapezium.  Electronically Signed   By: Gaylyn Rong M.D.   On: 11/07/2022 19:02   MR KNEE RIGHT W WO CONTRAST  Result Date: 11/07/2022 CLINICAL DATA:  Bacteremia and knee  tenderness EXAM: MRI OF THE RIGHT KNEE WITHOUT AND WITH CONTRAST TECHNIQUE: Multiplanar, multisequence MR imaging of the knee was performed before and after the administration of intravenous contrast. CONTRAST:  6mL GADAVIST GADOBUTROL 1 MMOL/ML IV SOLN COMPARISON:  None Available. FINDINGS: Despite efforts by the technologist and patient, motion artifact is present on today's exam and could not be eliminated. This reduces exam sensitivity and specificity. MENISCI Medial meniscus: Degenerative tearing of the posterior horn likely extending into the meniscal root. Mild grade 3 signal in the posterior horn extends to the inferior surface as on image 28 series 7. Lateral meniscus: Prominent expansion of the posterior horn extending towards the meniscal root suggesting prominent degeneration. LIGAMENTS Cruciates:  Unremarkable Collaterals:  Unremarkable CARTILAGE Patellofemoral: Moderate to severe chondral thinning with marginal spurring. Medial:  Mild chondral thinning. Lateral:  Mild chondral thinning. Joint: Moderate to large knee effusion. Only thin synovial enhancement. This is unlikely to be a septic joint given the thin nature of the synovial enhancement. Thin medial plica noted. Popliteal Fossa:  Unremarkable Extensor Mechanism: Possible focal central tendinopathy in the distal patellar tendon on image 13 series 7. Bones: 1.0 by 1.4 cm non-fragmented osteochondral lesion versus subcortical stress fracture anteriorly in the medial femoral condyle with surrounding marrow edema as on image 13 series 11 Other: No supplemental non-categorized findings. IMPRESSION: 1. Moderate to large knee effusion, but with only thin synovial enhancement. This is unlikely to be a septic joint given the thin nature of the synovial enhancement. 2. Non-fragmented osteochondral lesion versus subcortical stress fracture anteriorly in the medial femoral condyle with surrounding marrow edema. 3. Degenerative tearing of the posterior horn  medial meniscus likely extending into the meniscal root. 4. Prominent expansion of the posterior horn lateral meniscus extending towards the meniscal root suggesting prominent degeneration. 5. Moderate to severe chondral thinning in the patellofemoral compartment with mild chondral thinning in the medial and lateral compartments. 6. Possible focal central tendinopathy in the distal patellar tendon. Electronically Signed   By: Gaylyn Rong M.D.   On: 11/07/2022 18:54   ECHOCARDIOGRAM COMPLETE  Result Date: 11/07/2022    ECHOCARDIOGRAM REPORT   Patient Name:   NATALE BALCOM Date of Exam: 11/07/2022 Medical Rec #:  [id number]        Height:       71.0 in Accession #:    [id number]       Weight:       134.9 lb Date of Birth:  [birth year]       BSA:          1.784 m Patient Age:    76 years         BP:           139/76 mmHg Patient Gender: M                HR:           115 bpm. Exam Location:  Inpatient Procedure: 2D Echo, Cardiac Doppler and Color Doppler Indications:    Syncope R55  History:        Patient has no prior history of Echocardiogram examinations.                 COPD; Risk Factors:Hypertension and Dyslipidemia.  Sonographer:    Harriette Bouillon RDCS Referring  Phys: [id number] CAROLE N HALL IMPRESSIONS  1. Left ventricular ejection fraction, by estimation, is 60 to 65%. The left ventricle has normal function. The left ventricle has no regional wall motion abnormalities. Left ventricular diastolic parameters are consistent with Grade II diastolic dysfunction (pseudonormalization).  2. Right ventricular systolic function is normal. The right ventricular size is normal. There is normal pulmonary artery systolic pressure.  3. The mitral valve is normal in structure. No evidence of mitral valve regurgitation. No evidence of mitral stenosis.  4. The aortic valve is tricuspid. Aortic valve regurgitation is not visualized. No aortic stenosis is present.  5. The inferior vena cava is normal in size with greater  than 50% respiratory variability, suggesting right atrial pressure of 3 mmHg. FINDINGS  Left Ventricle: Left ventricular ejection fraction, by estimation, is 60 to 65%. The left ventricle has normal function. The left ventricle has no regional wall motion abnormalities. The left ventricular internal cavity size was normal in size. There is  no left ventricular hypertrophy. Left ventricular diastolic parameters are consistent with Grade II diastolic dysfunction (pseudonormalization). Right Ventricle: The right ventricular size is normal. No increase in right ventricular wall thickness. Right ventricular systolic function is normal. There is normal pulmonary artery systolic pressure. The tricuspid regurgitant velocity is 2.00 m/s, and  with an assumed right atrial pressure of 3 mmHg, the estimated right ventricular systolic pressure is 19.0 mmHg. Left Atrium: Left atrial size was normal in size. Right Atrium: Right atrial size was normal in size. Pericardium: There is no evidence of pericardial effusion. Mitral Valve: The mitral valve is normal in structure. No evidence of mitral valve regurgitation. No evidence of mitral valve stenosis. Tricuspid Valve: The tricuspid valve is normal in structure. Tricuspid valve regurgitation is trivial. No evidence of tricuspid stenosis. Aortic Valve: The aortic valve is tricuspid. Aortic valve regurgitation is not visualized. No aortic stenosis is present. Pulmonic Valve: The pulmonic valve was normal in structure. Pulmonic valve regurgitation is not visualized. No evidence of pulmonic stenosis. Aorta: The aortic root is normal in size and structure. Venous: The inferior vena cava is normal in size with greater than 50% respiratory variability, suggesting right atrial pressure of 3 mmHg. IAS/Shunts: No atrial level shunt detected by color flow Doppler.  LEFT VENTRICLE PLAX 2D LVIDd:         5.20 cm LVIDs:         4.00 cm LV PW:         1.10 cm LV IVS:        1.10 cm LVOT diam:      2.20 cm LV SV:         55 LV SV Index:   31 LVOT Area:     3.80 cm  IVC IVC diam: 1.20 cm LEFT ATRIUM         Index LA diam:    3.70 cm 2.07 cm/m  AORTIC VALVE LVOT Vmax:   97.90 cm/s LVOT Vmean:  70.600 cm/s LVOT VTI:    0.145 m  AORTA Ao Root diam: 3.10 cm Ao Asc diam:  3.40 cm MITRAL VALVE               TRICUSPID VALVE MV Area (PHT): 5.06 cm    TR Peak grad:   16.0 mmHg MV Decel Time: 150 msec    TR Vmax:        200.00 cm/s MV E velocity: 85.10 cm/s MV A velocity: 46.50 cm/s  SHUNTS MV E/A ratio:  1.83  Systemic VTI:  0.14 m                            Systemic Diam: 2.20 cm Chilton Si MD Electronically signed by Chilton Si MD Signature Date/Time: 11/07/2022/11:51:26 AM    Final    US RENAL  Result Date: 11/06/2022 CLINICAL DATA:  Acute kidney insufficiency EXAM: RENAL / URINARY TRACT ULTRASOUND COMPLETE COMPARISON:  CT 10/27/2022 FINDINGS: Right Kidney: Renal measurements: 11.4 x 6.7 x 5.5 cm = volume: 220.2 mL. No collecting system dilatation. Anechoic structure seen consistent with a cyst towards the lower pole measuring 2.1 cm. Left Kidney: Renal measurements: 10.5 x 6.5 x 5.6 cm = volume: 201.4 mL. Multiple cysts are identified which appears simple as seen on CT. Example measures up to 6 cm. No collecting system dilatation. Bladder: Bladder is distended. The bladder lumen has layering and floating debris. Other: None. IMPRESSION: No collecting system dilatation. Bilateral simple appearing renal cysts by ultrasound. Please correlate with the prior CT. Distended bladder with luminal floating and layering debris. Please correlate with clinical findings. Electronically Signed   By: Karen Kays M.D.   On: 11/06/2022 15:04    Review of Systems  HENT:  Negative for ear discharge, ear pain, hearing loss and tinnitus.   Eyes:  Negative for photophobia and pain.  Respiratory:  Negative for cough and shortness of breath.   Cardiovascular:  Negative for chest pain.  Gastrointestinal:   Negative for abdominal pain, nausea and vomiting.  Genitourinary:  Negative for dysuria, flank pain, frequency and urgency.  Musculoskeletal:  Positive for arthralgias (Left wrist, right knee, right index finger). Negative for back pain, myalgias and neck pain.  Neurological:  Negative for dizziness and headaches.  Hematological:  Does not bruise/bleed easily.  Psychiatric/Behavioral:  The patient is not nervous/anxious.    Blood pressure (!) 147/86, pulse (!) 120, temperature 98.9 F (37.2 C), temperature source Oral, resp. rate 16, height 5\' 11"  (1.803 m), weight 61.2 kg, SpO2 96%. Physical Exam Constitutional:      General: He is not in acute distress.    Appearance: He is well-developed. He is not diaphoretic.  HENT:     Head: Normocephalic and atraumatic.  Eyes:     General: No scleral icterus.       Right eye: No discharge.        Left eye: No discharge.     Conjunctiva/sclera: Conjunctivae normal.  Cardiovascular:     Rate and Rhythm: Normal rate and regular rhythm.  Pulmonary:     Effort: Pulmonary effort is normal. No respiratory distress.  Musculoskeletal:     Cervical back: Normal range of motion.     Comments: Left shoulder, elbow, wrist, digits- no skin wounds, mod TTP, severe pain with wrist AROM, painless PROM from 45 flexion to 15 ext but pain at extremes, no instability, no blocks to motion  Sens  Ax/R/M/U intact  Mot   Ax/ R/ PIN/ M/ AIN/ U intact  Rad 2+  RLE No traumatic wounds, ecchymosis, or rash  Mild TTP, severe pain with AROM, mild pain with PROM from 170-100, pain at extremes  Mod knee effusion  Sens DPN paresthetic SPN, TN absent  Motor EHL, ext, flex, evers 5/5  DP 2+, PT 2+, No significant edema  Skin:    General: Skin is warm and dry.  Neurological:     Mental Status: He is alert.  Psychiatric:  Mood and Affect: Mood normal.        Behavior: Behavior normal.     Assessment/Plan: Polyarthralgia -- My concern for septic joints is low.  Will tap knee later today and ask IR to tap wrist. Wrist splint for comfort.    Freeman Caldron, PA-C Orthopedic Surgery [phone] 11/08/2022, 10:24 AM

## 2022-11-08 NOTE — Plan of Care (Signed)
Patient has tolerated his R knee aspiration at bedside today with no issues, he has been placed on diabetic precautions and awaiting lab draws to result before treating the patient. Will continue to monitor patient.

## 2022-11-08 NOTE — Progress Notes (Signed)
Paged Charma Igo PA-C to notify patient consent and medication is on the floor and completed.

## 2022-11-08 NOTE — Progress Notes (Signed)
Patient insulin was checked today at 1411 and received 15 units per [facility] MD order. Patient called out at 1503 stating he feels dizzy and diaphoretic. Blood sugar is 430 and currently getting him a lab draw. Vann MD notified of the patient's condition. Will continue to monitor patient.

## 2022-11-08 NOTE — Progress Notes (Signed)
Subjective: Now with some worsening neck pain   Antibiotics:  Anti-infectives (From admission, onward)    Start     Dose/Rate Route Frequency Ordered Stop   11/07/22 0100  ceFAZolin (ANCEF) IVPB 2g/100 mL premix        2 g 200 mL/hr over 30 Minutes Intravenous Every 8 hours 11/06/22 1540     11/06/22 0100  cefTRIAXone (ROCEPHIN) 2 g in sodium chloride 0.9 % 100 mL IVPB  Status:  Discontinued        2 g 200 mL/hr over 30 Minutes Intravenous Daily at bedtime 11/06/22 0041 11/06/22 1513       Medications: Scheduled Meds:  amitriptyline  25 mg Oral QHS   bupivacaine(PF)  10 mL Infiltration Once   enoxaparin (LOVENOX) injection  30 mg Subcutaneous Daily   feeding supplement  237 mL Oral BID BM   insulin aspart  0-15 Units Subcutaneous TID WC   insulin aspart  0-5 Units Subcutaneous QHS   metoprolol tartrate  25 mg Oral BID   potassium chloride  40 mEq Oral Q4H   tamsulosin  0.4 mg Oral QPC supper   Continuous Infusions:   ceFAZolin (ANCEF) IV 2 g (11/08/22 0453)   diltiazem (CARDIZEM) infusion 15 mg/hr (11/08/22 0452)   magnesium sulfate bolus IVPB     potassium chloride 10 mEq (11/08/22 1249)   PRN Meds:.acetaminophen, melatonin, oxyCODONE, polyethylene glycol, prochlorperazine    Objective: Weight change:   Intake/Output Summary (Last 24 hours) at 11/08/2022 1256 Last data filed at 11/08/2022 0732 Gross per 24 hour  Intake 474 ml  Output 400 ml  Net 74 ml   Blood pressure 134/83, pulse 87, temperature 98.4 F (36.9 C), temperature source Oral, resp. rate 16, height 5\' 11"  (1.803 m), weight 61.2 kg, SpO2 92%. Temp:  [97.9 F (36.6 C)-99 F (37.2 C)] 98.4 F (36.9 C) (10/08 1140) Pulse Rate:  [87-132] 87 (10/08 1140) Resp:  [15-21] 16 (10/08 1140) BP: (120-153)/(71-96) 134/83 (10/08 1140) SpO2:  [92 %-98 %] 92 % (10/08 1140)  Physical Exam: Physical Exam Constitutional:      Appearance: He is well-developed.  HENT:     Head: Normocephalic and  atraumatic.  Eyes:     Conjunctiva/sclera: Conjunctivae normal.  Cardiovascular:     Rate and Rhythm: Normal rate and regular rhythm.  Pulmonary:     Effort: Pulmonary effort is normal. No respiratory distress.     Breath sounds: No wheezing.  Abdominal:     General: There is no distension.     Palpations: Abdomen is soft.  Musculoskeletal:     Left wrist: Swelling and tenderness present. Decreased range of motion.     Cervical back: Normal range of motion and neck supple.     Right knee: Swelling and effusion present. Tenderness present.  Skin:    General: Skin is warm and dry.     Findings: No erythema or rash.  Neurological:     General: No focal deficit present.     Mental Status: He is alert and oriented to person, place, and time.  Psychiatric:        Mood and Affect: Mood normal.        Behavior: Behavior normal.        Thought Content: Thought content normal.        Judgment: Judgment normal.      CBC:    BMET Recent Labs    11/07/22 0608 11/08/22 0732  NA  133* 134*  K 3.0* 2.9*  CL 99 95*  CO2 24 26  GLUCOSE 257* 321*  BUN 21 18  CREATININE 1.60* 1.32*  CALCIUM 7.9* 8.2*     Liver Panel  Recent Labs    11/05/22 2035  PROT 5.7*  ALBUMIN 2.2*  AST 28  ALT 22  ALKPHOS 116  BILITOT 0.4       Sedimentation Rate No results for input(s): "ESRSEDRATE" in the last 72 hours. C-Reactive Protein No results for input(s): "CRP" in the last 72 hours.  Micro Results: Recent Results (from the past 720 hour(s))  Culture, blood (Routine X 2) w Reflex to ID Panel     Status: Abnormal   Collection Time: 11/06/22  1:13 AM   Specimen: BLOOD RIGHT ARM  Result Value Ref Range Status   Specimen Description BLOOD RIGHT ARM  Final   Special Requests   Final    BOTTLES DRAWN AEROBIC AND ANAEROBIC Blood Culture results may not be optimal due to an excessive volume of blood received in culture bottles   Culture  Setup Time   Final    GRAM POSITIVE COCCI IN  BOTH AEROBIC AND ANAEROBIC BOTTLES Organism ID to follow CRITICAL RESULT CALLED TO, READ BACK BY AND VERIFIED WITH: PHARMD JESSICA MILLEN [id number] BY Berline Chough, [place] Performed at [facility], [address] [place], [place] [address]    Culture STAPHYLOCOCCUS AUREUS (A)  Final   Report Status 11/08/2022 FINAL  Final   Organism ID, Bacteria STAPHYLOCOCCUS AUREUS  Final      Susceptibility   Staphylococcus aureus - MIC*    CIPROFLOXACIN <=0.5 SENSITIVE Sensitive     ERYTHROMYCIN <=0.25 SENSITIVE Sensitive     GENTAMICIN <=0.5 SENSITIVE Sensitive     OXACILLIN 0.5 SENSITIVE Sensitive     TETRACYCLINE <=1 SENSITIVE Sensitive     VANCOMYCIN <=0.5 SENSITIVE Sensitive     TRIMETH/SULFA <=10 SENSITIVE Sensitive     CLINDAMYCIN <=0.25 SENSITIVE Sensitive     RIFAMPIN <=0.5 SENSITIVE Sensitive     Inducible Clindamycin NEGATIVE Sensitive     LINEZOLID 1 SENSITIVE Sensitive     * STAPHYLOCOCCUS AUREUS  Blood Culture ID Panel (Reflexed)     Status: Abnormal   Collection Time: 11/06/22  1:13 AM  Result Value Ref Range Status   Enterococcus faecalis NOT DETECTED NOT DETECTED Final   Enterococcus Faecium NOT DETECTED NOT DETECTED Final   Listeria monocytogenes NOT DETECTED NOT DETECTED Final   Staphylococcus species DETECTED (A) NOT DETECTED Final    Comment: CRITICAL RESULT CALLED TO, READ BACK BY AND VERIFIED WITH: PHARMD JESSICA MILLEN [phone] 1429 BY J RAZZAK, MT    Staphylococcus aureus (BCID) DETECTED (A) NOT DETECTED Final    Comment: CRITICAL RESULT CALLED TO, READ BACK BY AND VERIFIED WITH: PHARMD JESSICA MILLEN [id number] 1429 BY J RAZZAK, MT    Staphylococcus epidermidis NOT DETECTED NOT DETECTED Final   Staphylococcus lugdunensis NOT DETECTED NOT DETECTED Final   Streptococcus species NOT DETECTED NOT DETECTED Final   Streptococcus agalactiae NOT DETECTED NOT DETECTED Final   Streptococcus pneumoniae NOT DETECTED NOT DETECTED Final   Streptococcus pyogenes NOT DETECTED  NOT DETECTED Final   A.calcoaceticus-baumannii NOT DETECTED NOT DETECTED Final   Bacteroides fragilis NOT DETECTED NOT DETECTED Final   Enterobacterales NOT DETECTED NOT DETECTED Final   Enterobacter cloacae complex NOT DETECTED NOT DETECTED Final   Escherichia coli NOT DETECTED NOT DETECTED Final   Klebsiella aerogenes NOT DETECTED NOT DETECTED Final  Klebsiella oxytoca NOT DETECTED NOT DETECTED Final   Klebsiella pneumoniae NOT DETECTED NOT DETECTED Final   Proteus species NOT DETECTED NOT DETECTED Final   Salmonella species NOT DETECTED NOT DETECTED Final   Serratia marcescens NOT DETECTED NOT DETECTED Final   Haemophilus influenzae NOT DETECTED NOT DETECTED Final   Neisseria meningitidis NOT DETECTED NOT DETECTED Final   Pseudomonas aeruginosa NOT DETECTED NOT DETECTED Final   Stenotrophomonas maltophilia NOT DETECTED NOT DETECTED Final   Candida albicans NOT DETECTED NOT DETECTED Final   Candida auris NOT DETECTED NOT DETECTED Final   Candida glabrata NOT DETECTED NOT DETECTED Final   Candida krusei NOT DETECTED NOT DETECTED Final   Candida parapsilosis NOT DETECTED NOT DETECTED Final   Candida tropicalis NOT DETECTED NOT DETECTED Final   Cryptococcus neoformans/gattii NOT DETECTED NOT DETECTED Final   Meth resistant mecA/C and MREJ NOT DETECTED NOT DETECTED Final    Comment: Performed at [facility], [address], [place], [place] [address]  Culture, blood (Routine X 2) w Reflex to ID Panel     Status: Abnormal   Collection Time: 11/06/22  1:14 AM   Specimen: BLOOD RIGHT ARM  Result Value Ref Range Status   Specimen Description BLOOD RIGHT ARM  Final   Special Requests   Final    BOTTLES DRAWN AEROBIC AND ANAEROBIC Blood Culture results may not be optimal due to an excessive volume of blood received in culture bottles   Culture  Setup Time   Final    GRAM POSITIVE COCCI IN BOTH AEROBIC AND ANAEROBIC BOTTLES    Culture (A)  Final    STAPHYLOCOCCUS  AUREUS SUSCEPTIBILITIES PERFORMED ON PREVIOUS CULTURE WITHIN THE LAST 5 DAYS. Performed at [facility], [address] [place], [place] [address]    Report Status 11/08/2022 FINAL  Final  MRSA Next Gen by PCR, Nasal     Status: None   Collection Time: 11/06/22  1:29 AM   Specimen: Nasal Mucosa; Nasal Swab  Result Value Ref Range Status   MRSA by PCR Next Gen NOT DETECTED NOT DETECTED Final    Comment: (NOTE) The GeneXpert MRSA Assay (FDA approved for NASAL specimens only), is one component of a comprehensive MRSA colonization surveillance program. It is not intended to diagnose MRSA infection nor to guide or monitor treatment for MRSA infections. Test performance is not FDA approved in patients less than 71 years old. Performed at [facility], [address], [place], [place] [address]   SARS Coronavirus 2 by RT PCR (hospital order, performed in [facility] hospital lab) *cepheid single result test* Anterior Nasal Swab     Status: None   Collection Time: 11/06/22  7:33 AM   Specimen: Anterior Nasal Swab  Result Value Ref Range Status   SARS Coronavirus 2 by RT PCR NEGATIVE NEGATIVE Final    Comment: Performed at [facility], [address], [place], [place] [address]  Respiratory (~20 pathogens) panel by PCR     Status: None   Collection Time: 11/06/22  7:34 AM   Specimen: Nasopharyngeal Swab; Respiratory  Result Value Ref Range Status   Adenovirus NOT DETECTED NOT DETECTED Final   Coronavirus 229E NOT DETECTED NOT DETECTED Final    Comment: (NOTE) The Coronavirus on the Respiratory Panel, DOES NOT test for the novel  Coronavirus (2019 nCoV)    Coronavirus HKU1 NOT DETECTED NOT DETECTED Final   Coronavirus NL63 NOT DETECTED NOT DETECTED Final   Coronavirus  OC43 NOT DETECTED NOT DETECTED Final   Metapneumovirus NOT DETECTED NOT DETECTED Final   Rhinovirus / Enterovirus NOT DETECTED NOT DETECTED Final   Influenza A NOT DETECTED NOT DETECTED Final    Influenza B NOT DETECTED NOT DETECTED Final   Parainfluenza Virus 1 NOT DETECTED NOT DETECTED Final   Parainfluenza Virus 2 NOT DETECTED NOT DETECTED Final   Parainfluenza Virus 3 NOT DETECTED NOT DETECTED Final   Parainfluenza Virus 4 NOT DETECTED NOT DETECTED Final   Respiratory Syncytial Virus NOT DETECTED NOT DETECTED Final   Bordetella pertussis NOT DETECTED NOT DETECTED Final   Bordetella Parapertussis NOT DETECTED NOT DETECTED Final   Chlamydophila pneumoniae NOT DETECTED NOT DETECTED Final   Mycoplasma pneumoniae NOT DETECTED NOT DETECTED Final    Comment: Performed at [facility], [address] [place], [place] [address]  Culture, blood (Routine X 2) w Reflex to ID Panel     Status: None (Preliminary result)   Collection Time: 11/07/22  6:08 AM   Specimen: BLOOD RIGHT ARM  Result Value Ref Range Status   Specimen Description BLOOD RIGHT ARM  Final   Special Requests   Final    BOTTLES DRAWN AEROBIC AND ANAEROBIC Blood Culture adequate volume   Culture   Final    NO GROWTH 1 DAY Performed at [facility], [address], [place], [place] [address]    Report Status PENDING  Incomplete  Culture, blood (Routine X 2) w Reflex to ID Panel     Status: None (Preliminary result)   Collection Time: 11/07/22  6:08 AM   Specimen: BLOOD RIGHT HAND  Result Value Ref Range Status   Specimen Description BLOOD RIGHT HAND  Final   Special Requests   Final    BOTTLES DRAWN AEROBIC AND ANAEROBIC Blood Culture results may not be optimal due to an excessive volume of blood received in culture bottles   Culture   Final    NO GROWTH 1 DAY Performed at [facility], [address] [place], [place] [address]    Report Status PENDING  Incomplete  Culture, blood (Routine X 2) w Reflex to ID Panel     Status: None (Preliminary result)   Collection Time: 11/07/22  9:19 PM   Specimen: BLOOD RIGHT HAND  Result Value Ref Range Status   Specimen Description BLOOD RIGHT HAND  Final    Special Requests   Final    BOTTLES DRAWN AEROBIC AND ANAEROBIC Blood Culture adequate volume   Culture   Final    NO GROWTH < 24 HOURS Performed at [facility], [address] [place], [place] [address]    Report Status PENDING  Incomplete  Culture, blood (Routine X 2) w Reflex to ID Panel     Status: None (Preliminary result)   Collection Time: 11/07/22  9:19 PM   Specimen: BLOOD RIGHT HAND  Result Value Ref Range Status   Specimen Description BLOOD RIGHT HAND  Final   Special Requests   Final    BOTTLES DRAWN AEROBIC ONLY Blood Culture results may not be optimal due to an inadequate volume of blood received in culture bottles   Culture   Final    NO GROWTH < 24 HOURS Performed at [facility], [address], [place], [place] [address]    Report Status PENDING  Incomplete    Studies/Results: DG Finger Index Right  Result Date: 11/07/2022 CLINICAL DATA:  Osteomyelitis.  Pain. EXAM: RIGHT  INDEX FINGER 2+V COMPARISON:  None Available. FINDINGS: Arthropathy of the distal interphalangeal joint with joint space narrowing and spurring. There may be a small erosion involving the radial aspect of the joint space. No frank bony destruction. The distal phalanx is dorsally subluxed, no frank dislocation. Joint space narrowing with subchondral cysts at the metacarpal phalangeal joint. Mild generalized soft tissue edema. No soft tissue gas or radiopaque foreign body. IMPRESSION: 1. Arthropathy of the distal interphalangeal joint with dorsal subluxation of the distal phalanx. There may be a small erosion involving the radial aspect of the joint space. This could represent infection in the appropriate clinical setting. No frank bony destruction. 2. Mild generalized soft tissue edema. Electronically Signed   By: Narda Rutherford M.D.   On: 11/07/2022 19:41   MR WRIST LEFT W WO CONTRAST  Result Date: 11/07/2022 CLINICAL DATA:  Bacteremia.  Swelling and tenderness of the wrist EXAM: MR  OF THE LEFT WRIST WITHOUT AND WITH CONTRAST TECHNIQUE: Multiplanar multisequence MR imaging of the left wrist was performed both before and after the administration of intravenous contrast. CONTRAST:  6mL GADAVIST GADOBUTROL 1 MMOL/ML IV SOLN COMPARISON:  None Available. FINDINGS: Despite efforts by the technologist and patient, motion artifact is present on today's exam and could not be eliminated. This reduces exam sensitivity and specificity. Ligaments: Grossly intact appearing intrinsic interosseous ligaments. No obvious the dorsal or volar ligamentous insufficiency. Triangular fibrocartilage: Grossly intact although partially obscured by field heterogeneity. Tendons: Unremarkable Carpal tunnel/median nerve: Unremarkable Guyon's canal: No impinging lesion identified. Joint/cartilage: Radiocarpal and midcarpal joint effusions. Bones/carpal alignment: Small degenerative subacute S cortical cystic lesions along the lunate side of the scaphoid (image 9, series 8) and along the proximal pole of the capitate. Advanced degenerative arthropathy of the first carpal metacarpal articulation with prominent subcortical cyst formation and spurring. Mild degenerative arthropathy between the scaphoid and trapezium. Small effusion of the distal radioulnar joint with associated degenerative arthropathy as on image 20 series 9. Other: Dorsal subcutaneous edema along the wrist and extending into the hand. No abscess observed. IMPRESSION: 1. Dorsal subcutaneous edema along the wrist and extending into the hand, without abscess. 2. Radiocarpal and midcarpal joint effusions. Effusion of the distal radioulnar joint. 3. Advanced degenerative arthropathy of the first carpometacarpal articulation. Mild degenerative arthropathy between the scaphoid and trapezium. Electronically Signed   By: Gaylyn Rong M.D.   On: 11/07/2022 19:02   MR KNEE RIGHT W WO CONTRAST  Result Date: 11/07/2022 CLINICAL DATA:  Bacteremia and knee  tenderness EXAM: MRI OF THE RIGHT KNEE WITHOUT AND WITH CONTRAST TECHNIQUE: Multiplanar, multisequence MR imaging of the knee was performed before and after the administration of intravenous contrast. CONTRAST:  6mL GADAVIST GADOBUTROL 1 MMOL/ML IV SOLN COMPARISON:  None Available. FINDINGS: Despite efforts by the technologist and patient, motion artifact is present on today's exam and could not be eliminated. This reduces exam sensitivity and specificity. MENISCI Medial meniscus: Degenerative tearing of the posterior horn likely extending into the meniscal root. Mild grade 3 signal in the posterior horn extends to the inferior surface as on image 28 series 7. Lateral meniscus: Prominent expansion of the posterior horn extending towards the meniscal root suggesting prominent degeneration. LIGAMENTS Cruciates:  Unremarkable Collaterals:  Unremarkable CARTILAGE Patellofemoral: Moderate to severe chondral thinning with marginal spurring. Medial:  Mild chondral thinning. Lateral:  Mild chondral thinning. Joint: Moderate to large knee effusion. Only thin synovial enhancement. This is unlikely to be a septic joint given the thin nature of the synovial  enhancement. Thin medial plica noted. Popliteal Fossa:  Unremarkable Extensor Mechanism: Possible focal central tendinopathy in the distal patellar tendon on image 13 series 7. Bones: 1.0 by 1.4 cm non-fragmented osteochondral lesion versus subcortical stress fracture anteriorly in the medial femoral condyle with surrounding marrow edema as on image 13 series 11 Other: No supplemental non-categorized findings. IMPRESSION: 1. Moderate to large knee effusion, but with only thin synovial enhancement. This is unlikely to be a septic joint given the thin nature of the synovial enhancement. 2. Non-fragmented osteochondral lesion versus subcortical stress fracture anteriorly in the medial femoral condyle with surrounding marrow edema. 3. Degenerative tearing of the posterior horn  medial meniscus likely extending into the meniscal root. 4. Prominent expansion of the posterior horn lateral meniscus extending towards the meniscal root suggesting prominent degeneration. 5. Moderate to severe chondral thinning in the patellofemoral compartment with mild chondral thinning in the medial and lateral compartments. 6. Possible focal central tendinopathy in the distal patellar tendon. Electronically Signed   By: Gaylyn Rong M.D.   On: 11/07/2022 18:54   ECHOCARDIOGRAM COMPLETE  Result Date: 11/07/2022    ECHOCARDIOGRAM REPORT   Patient Name:   XAVIOR LORAH Date of Exam: 11/07/2022 Medical Rec #:  [id number]        Height:       71.0 in Accession #:    [id number]       Weight:       134.9 lb Date of Birth:  October 17, 1945       BSA:          1.784 m Patient Age:    76 years         BP:           139/76 mmHg Patient Gender: M                HR:           115 bpm. Exam Location:  Inpatient Procedure: 2D Echo, Cardiac Doppler and Color Doppler Indications:    Syncope R55  History:        Patient has no prior history of Echocardiogram examinations.                 COPD; Risk Factors:Hypertension and Dyslipidemia.  Sonographer:    Harriette Bouillon RDCS Referring Phys: [id number] CAROLE N HALL IMPRESSIONS  1. Left ventricular ejection fraction, by estimation, is 60 to 65%. The left ventricle has normal function. The left ventricle has no regional wall motion abnormalities. Left ventricular diastolic parameters are consistent with Grade II diastolic dysfunction (pseudonormalization).  2. Right ventricular systolic function is normal. The right ventricular size is normal. There is normal pulmonary artery systolic pressure.  3. The mitral valve is normal in structure. No evidence of mitral valve regurgitation. No evidence of mitral stenosis.  4. The aortic valve is tricuspid. Aortic valve regurgitation is not visualized. No aortic stenosis is present.  5. The inferior vena cava is normal in size with greater  than 50% respiratory variability, suggesting right atrial pressure of 3 mmHg. FINDINGS  Left Ventricle: Left ventricular ejection fraction, by estimation, is 60 to 65%. The left ventricle has normal function. The left ventricle has no regional wall motion abnormalities. The left ventricular internal cavity size was normal in size. There is  no left ventricular hypertrophy. Left ventricular diastolic parameters are consistent with Grade II diastolic dysfunction (pseudonormalization). Right Ventricle: The right ventricular size is normal. No increase in right ventricular wall thickness. Right ventricular  systolic function is normal. There is normal pulmonary artery systolic pressure. The tricuspid regurgitant velocity is 2.00 m/s, and  with an assumed right atrial pressure of 3 mmHg, the estimated right ventricular systolic pressure is 19.0 mmHg. Left Atrium: Left atrial size was normal in size. Right Atrium: Right atrial size was normal in size. Pericardium: There is no evidence of pericardial effusion. Mitral Valve: The mitral valve is normal in structure. No evidence of mitral valve regurgitation. No evidence of mitral valve stenosis. Tricuspid Valve: The tricuspid valve is normal in structure. Tricuspid valve regurgitation is trivial. No evidence of tricuspid stenosis. Aortic Valve: The aortic valve is tricuspid. Aortic valve regurgitation is not visualized. No aortic stenosis is present. Pulmonic Valve: The pulmonic valve was normal in structure. Pulmonic valve regurgitation is not visualized. No evidence of pulmonic stenosis. Aorta: The aortic root is normal in size and structure. Venous: The inferior vena cava is normal in size with greater than 50% respiratory variability, suggesting right atrial pressure of 3 mmHg. IAS/Shunts: No atrial level shunt detected by color flow Doppler.  LEFT VENTRICLE PLAX 2D LVIDd:         5.20 cm LVIDs:         4.00 cm LV PW:         1.10 cm LV IVS:        1.10 cm LVOT diam:      2.20 cm LV SV:         55 LV SV Index:   31 LVOT Area:     3.80 cm  IVC IVC diam: 1.20 cm LEFT ATRIUM         Index LA diam:    3.70 cm 2.07 cm/m  AORTIC VALVE LVOT Vmax:   97.90 cm/s LVOT Vmean:  70.600 cm/s LVOT VTI:    0.145 m  AORTA Ao Root diam: 3.10 cm Ao Asc diam:  3.40 cm MITRAL VALVE               TRICUSPID VALVE MV Area (PHT): 5.06 cm    TR Peak grad:   16.0 mmHg MV Decel Time: 150 msec    TR Vmax:        200.00 cm/s MV E velocity: 85.10 cm/s MV A velocity: 46.50 cm/s  SHUNTS MV E/A ratio:  1.83        Systemic VTI:  0.14 m                            Systemic Diam: 2.20 cm Chilton Si MD Electronically signed by Chilton Si MD Signature Date/Time: 11/07/2022/11:51:26 AM    Final    US RENAL  Result Date: 11/06/2022 CLINICAL DATA:  Acute kidney insufficiency EXAM: RENAL / URINARY TRACT ULTRASOUND COMPLETE COMPARISON:  CT 10/27/2022 FINDINGS: Right Kidney: Renal measurements: 11.4 x 6.7 x 5.5 cm = volume: 220.2 mL. No collecting system dilatation. Anechoic structure seen consistent with a cyst towards the lower pole measuring 2.1 cm. Left Kidney: Renal measurements: 10.5 x 6.5 x 5.6 cm = volume: 201.4 mL. Multiple cysts are identified which appears simple as seen on CT. Example measures up to 6 cm. No collecting system dilatation. Bladder: Bladder is distended. The bladder lumen has layering and floating debris. Other: None. IMPRESSION: No collecting system dilatation. Bilateral simple appearing renal cysts by ultrasound. Please correlate with the prior CT. Distended bladder with luminal floating and layering debris. Please correlate with clinical findings. Electronically Signed  By: Karen Kays M.D.   On: 11/06/2022 15:04      Assessment/Plan:  INTERVAL HISTORY: Multiple MRIs had been performed but not yet read when I saw the patient   Principal Problem:   MSSA bacteremia Active Problems:   Syncope   Septic infrapatellar bursitis of right knee   Staphylococcal arthritis of  left wrist (HCC)   Infected blister of left index finger    Michael Bender is a 77 y.o. male with history of cervical lumbar spine hardware and recent admission for falls weakness and rhabdomyolysis who was discharged to skilled nursing facility but then brought back with sepsis due to MSSA bacteremia.  He had fallen but also headaches was at pain in his left wrist as well as pain in his right knee and index finger.  MRI of the wrist shows some dorsal subcutaneous edema along the wrist extending the hand without an abscess with radiocarpal and midcarpal joint effusions and infusion of the distal radial ulnar joint with advanced degenerative arthropathy.  MRI of the knee shows a large effusion with some thin synovial enhancement with nonfermenting osteochondral lesion versus subcortical stress fracture anteriorly medial femoral condyle with surrounding marrow edema and degenerative tearing of the posterior horn of the medial meniscus  Plain films of the index finger left hand show arthropathy with subluxation of the distal phalanx with small erosion involving the radial aspect of the joint space.  Greatly appreciate orthopedic surgery who have aspirated the knee for cell count differential culture crystals.  My understanding is IR is going to aspirate his wrist later today.  He now has worsening neck pain and will need to have a cervical spine where he has hardware imaged.  He says he is not up for another MRI today.  Similarly for thoroughness he should probably have an MRI of his finger.  Since I suspect he will be to have a deep infection require 6 weeks of antibiotics I do not think pursuing TEE is necessary in him unless we were to discover something that required cardiothoracic surgery which I doubt.    I have personally spent 54 minutes involved in face-to-face and non-face-to-face activities for this patient on the day of the visit. Professional time spent includes the following  activities: Preparing to see the patient (review of tests), Obtaining and/or reviewing separately obtained history (admission/discharge record), Performing a medically appropriate examination and/or evaluation , Ordering medications/tests/procedures, referring and communicating with other health care professionals, Documenting clinical information in the EMR, Independently interpreting results (not separately reported), Communicating results to the patient/family/caregiver, Counseling and educating the patient/family/caregiver and Care coordination (not separately reported).       LOS: 3 days   Michael Bender 11/08/2022, 12:56 PM

## 2022-11-08 NOTE — Care Management Important Message (Signed)
Important Message  Patient Details  Name: Michael Bender MRN: [id number] Date of Birth: 14-Jul-1945   Important Message Given:  Yes - Medicare IM     Sherilyn Banker 11/08/2022, 4:04 PM

## 2022-11-08 NOTE — Consult Note (Signed)
Reason for Consult:Polyarthralgia Referring Physician: Marlin Canary Time called: 0102 Time at bedside: 0948   Michael Bender is an 77 y.o. male.  HPI: Keneth was admitted 2d ago with multiple falls and weakness. He had a similar admission from 9/26-10/2. He was noted to have MSSA blood cultures on admission. He admits to left wrist and right knee pain that are pretty severe and getting worse. This has been going on for about a week and he doesn't think it stemmed from any of his falls. He denies prior hx/o similar. ID in involved and requested aspirations from the knee and wrist. He is RHD.  Past Medical History:  Diagnosis Date   Arthritis    Back   Blood transfusion    as a child   Chronic back pain    COPD (chronic obstructive pulmonary disease) (HCC)    Disturbance of skin sensation 05/22/2018   Essential hypertension 03/05/2007   Qualifier: Diagnosis of   By: Tawanna Cooler MD, Eugenio Hoes      Gastric erosion    Gastritis and gastroduodenitis    Gastrointestinal hemorrhage with melena 11/20/2018   GIB (gastrointestinal bleeding) 07/18/2019   Headache(784.0)    last one 6 months ago   Hemorrhoids, internal    History of duodenal ulcer    History of lumbar fusion 03/03/2022   RE-OPERATIVE DIAGNOSIS:  lumbar stenosis synovial cyst lumbar spondylosis spondylolisthesis lumbar radiculoapthy L4/5   PROCEDURE:  Procedure(s): POSTERIOR LUMBAR FUSION 1 LEVEL with resection of synovial cyst     History of upper gastrointestinal bleeding 03/03/2022   Duodenal ulcer 2021 Dr. [name]   HLD (hyperlipidemia)    Hypertension    Loss of weight    Wt Readings from Last 10 Encounters:  04/05/22  146 lb (66.2 kg)  03/03/22  143 lb 9.6 oz (65.1 kg)  07/14/21  153 lb (69.4 kg)  12/14/20  147 lb 12.8 oz (67 kg)  11/13/19  154 lb (69.9 kg)  09/16/19  146 lb (66.2 kg)  08/06/19  146 lb (66.2 kg)  07/19/19  144 lb 13.5 oz (65.7 kg)  07/17/19  148 lb (67.1 kg)  07/09/19  147 lb 9.6 oz (67 kg)         Neck  rigidity    post cervical fusion   Nocturia    PAIN, CHRONIC NEC 10/06/2006   Qualifier: Diagnosis of   By: Tawanna Cooler RN, Alvino Chapel       Pneumonia    Prostate disease    S/P cervical spinal fusion 03/03/2022   With persistent cervical pain and palpable screws Led to disability History of attempt to dig furrow in skull to fix it Last surgery 1992    Past Surgical History:  Procedure Laterality Date   BIOPSY  07/19/2019   Procedure: BIOPSY;  Surgeon: Shellia Cleverly, DO;  Location: [facility];  Service: Gastroenterology;;   CERVICAL FUSION  1992   C2/C 3  four surgeries   COLONOSCOPY     ESOPHAGOGASTRODUODENOSCOPY  07/20/2011   Procedure: ESOPHAGOGASTRODUODENOSCOPY (EGD);  Surgeon: [name], MD;  Location: [facility];  Service: Endoscopy;  Laterality: N/A;   ESOPHAGOGASTRODUODENOSCOPY (EGD) WITH PROPOFOL N/A 07/19/2019   Procedure: ESOPHAGOGASTRODUODENOSCOPY (EGD) WITH PROPOFOL;  Surgeon: Shellia Cleverly, DO;  Location: [facility];  Service: Gastroenterology;  Laterality: N/A;   SAVORY DILATION  07/20/2011   Procedure: SAVORY DILATION;  Surgeon: [name], MD;  Location: [facility];  Service: Endoscopy;  Laterality: N/A;   SPINAL FUSION  05/06/11  Family History  Problem Relation Age of Onset   Heart disease Mother    Heart attack Mother 6   Pancreatic cancer Father 61   Pancreatic cancer Brother    Anesthesia problems Neg Hx    Colon cancer Neg Hx    Liver cancer Neg Hx    Stomach cancer Neg Hx    Esophageal cancer Neg Hx    Rectal cancer Neg Hx     Social History:  reports that he has been smoking cigarettes. He started smoking about 43 years ago. He has never used smokeless tobacco. He reports that he does not currently use alcohol after a past usage of about 2.0 standard drinks of alcohol per week. He reports that he does not use drugs.  Allergies:  Allergies  Allergen Reactions   Nubain [Nalbuphine Hcl]     Muscle contraction    Medications: I  have reviewed the patient's current medications.  Results for orders placed or performed during the hospital encounter of 11/05/22 (from the past 48 hour(s))  CBC     Status: Abnormal   Collection Time: 11/07/22  6:08 AM  Result Value Ref Range   WBC 7.0 4.0 - 10.5 K/uL   RBC 2.81 (L) 4.22 - 5.81 MIL/uL   Hemoglobin 8.6 (L) 13.0 - 17.0 g/dL   HCT 62.1 (L) 30.8 - 65.7 %   MCV 90.4 80.0 - 100.0 fL   MCH 30.6 26.0 - 34.0 pg   MCHC 33.9 30.0 - 36.0 g/dL   RDW 84.6 96.2 - 95.2 %   Platelets 125 (L) 150 - 400 K/uL    Comment: REPEATED TO VERIFY   nRBC 0.0 0.0 - 0.2 %    Comment: Performed at [facility], [address], [place], [place] [address]  Basic metabolic panel     Status: Abnormal   Collection Time: 11/07/22  6:08 AM  Result Value Ref Range   Sodium 133 (L) 135 - 145 mmol/L   Potassium 3.0 (L) 3.5 - 5.1 mmol/L   Chloride 99 98 - 111 mmol/L   CO2 24 22 - 32 mmol/L   Glucose, Bld 257 (H) 70 - 99 mg/dL    Comment: Glucose reference range applies only to samples taken after fasting for at least 8 hours.   BUN 21 8 - 23 mg/dL   Creatinine, Ser 4.40 (H) 0.61 - 1.24 mg/dL   Calcium 7.9 (L) 8.9 - 10.3 mg/dL   GFR, Estimated 44 (L) >60 mL/min    Comment: (NOTE) Calculated using the CKD-EPI Creatinine Equation (2021)    Anion gap 10 5 - 15    Comment: Performed at [facility], [address] [place], [place] [address]  Cortisol     Status: None   Collection Time: 11/07/22  6:08 AM  Result Value Ref Range   Cortisol, Plasma 16.7 ug/dL    Comment: (NOTE) AM    6.7 - 22.6 ug/dL PM   <53.6       ug/dL Performed at [facility], [address] [place], [place] [address]   Magnesium     Status: None   Collection Time: 11/07/22  6:08 AM  Result Value Ref Range   Magnesium 1.8 1.7 - 2.4 mg/dL    Comment: Performed at [facility], [address], [place], [place] [address]  Basic metabolic panel     Status: Abnormal   Collection Time: 11/08/22  7:32 AM   Result Value Ref  Range   Sodium 134 (L) 135 - 145 mmol/L   Potassium 2.9 (L) 3.5 - 5.1 mmol/L   Chloride 95 (L) 98 - 111 mmol/L   CO2 26 22 - 32 mmol/L   Glucose, Bld 321 (H) 70 - 99 mg/dL    Comment: Glucose reference range applies only to samples taken after fasting for at least 8 hours.   BUN 18 8 - 23 mg/dL   Creatinine, Ser 7.82 (H) 0.61 - 1.24 mg/dL   Calcium 8.2 (L) 8.9 - 10.3 mg/dL   GFR, Estimated 56 (L) >60 mL/min    Comment: (NOTE) Calculated using the CKD-EPI Creatinine Equation (2021)    Anion gap 13 5 - 15    Comment: Performed at [facility], [address], [place], [place] [address]  Magnesium     Status: None   Collection Time: 11/08/22  8:49 AM  Result Value Ref Range   Magnesium 1.8 1.7 - 2.4 mg/dL    Comment: Performed at [facility], [address], [place], [place] [address]    DG Finger Index Right  Result Date: 11/07/2022 CLINICAL DATA:  Osteomyelitis.  Pain. EXAM: RIGHT INDEX FINGER 2+V COMPARISON:  None Available. FINDINGS: Arthropathy of the distal interphalangeal joint with joint space narrowing and spurring. There may be a small erosion involving the radial aspect of the joint space. No frank bony destruction. The distal phalanx is dorsally subluxed, no frank dislocation. Joint space narrowing with subchondral cysts at the metacarpal phalangeal joint. Mild generalized soft tissue edema. No soft tissue gas or radiopaque foreign body. IMPRESSION: 1. Arthropathy of the distal interphalangeal joint with dorsal subluxation of the distal phalanx. There may be a small erosion involving the radial aspect of the joint space. This could represent infection in the appropriate clinical setting. No frank bony destruction. 2. Mild generalized soft tissue edema. Electronically Signed   By: Narda Rutherford M.D.   On: 11/07/2022 19:41   MR WRIST LEFT W WO CONTRAST  Result Date: 11/07/2022 CLINICAL DATA:  Bacteremia.  Swelling and tenderness of the wrist  EXAM: MR OF THE LEFT WRIST WITHOUT AND WITH CONTRAST TECHNIQUE: Multiplanar multisequence MR imaging of the left wrist was performed both before and after the administration of intravenous contrast. CONTRAST:  6mL GADAVIST GADOBUTROL 1 MMOL/ML IV SOLN COMPARISON:  None Available. FINDINGS: Despite efforts by the technologist and patient, motion artifact is present on today's exam and could not be eliminated. This reduces exam sensitivity and specificity. Ligaments: Grossly intact appearing intrinsic interosseous ligaments. No obvious the dorsal or volar ligamentous insufficiency. Triangular fibrocartilage: Grossly intact although partially obscured by field heterogeneity. Tendons: Unremarkable Carpal tunnel/median nerve: Unremarkable Guyon's canal: No impinging lesion identified. Joint/cartilage: Radiocarpal and midcarpal joint effusions. Bones/carpal alignment: Small degenerative subacute S cortical cystic lesions along the lunate side of the scaphoid (image 9, series 8) and along the proximal pole of the capitate. Advanced degenerative arthropathy of the first carpal metacarpal articulation with prominent subcortical cyst formation and spurring. Mild degenerative arthropathy between the scaphoid and trapezium. Small effusion of the distal radioulnar joint with associated degenerative arthropathy as on image 20 series 9. Other: Dorsal subcutaneous edema along the wrist and extending into the hand. No abscess observed. IMPRESSION: 1. Dorsal subcutaneous edema along the wrist and extending into the hand, without abscess. 2. Radiocarpal and midcarpal joint effusions. Effusion of the distal radioulnar joint. 3. Advanced degenerative arthropathy of the first carpometacarpal articulation. Mild degenerative arthropathy between the scaphoid and trapezium.  Electronically Signed   By: Gaylyn Rong M.D.   On: 11/07/2022 19:02   MR KNEE RIGHT W WO CONTRAST  Result Date: 11/07/2022 CLINICAL DATA:  Bacteremia and knee  tenderness EXAM: MRI OF THE RIGHT KNEE WITHOUT AND WITH CONTRAST TECHNIQUE: Multiplanar, multisequence MR imaging of the knee was performed before and after the administration of intravenous contrast. CONTRAST:  6mL GADAVIST GADOBUTROL 1 MMOL/ML IV SOLN COMPARISON:  None Available. FINDINGS: Despite efforts by the technologist and patient, motion artifact is present on today's exam and could not be eliminated. This reduces exam sensitivity and specificity. MENISCI Medial meniscus: Degenerative tearing of the posterior horn likely extending into the meniscal root. Mild grade 3 signal in the posterior horn extends to the inferior surface as on image 28 series 7. Lateral meniscus: Prominent expansion of the posterior horn extending towards the meniscal root suggesting prominent degeneration. LIGAMENTS Cruciates:  Unremarkable Collaterals:  Unremarkable CARTILAGE Patellofemoral: Moderate to severe chondral thinning with marginal spurring. Medial:  Mild chondral thinning. Lateral:  Mild chondral thinning. Joint: Moderate to large knee effusion. Only thin synovial enhancement. This is unlikely to be a septic joint given the thin nature of the synovial enhancement. Thin medial plica noted. Popliteal Fossa:  Unremarkable Extensor Mechanism: Possible focal central tendinopathy in the distal patellar tendon on image 13 series 7. Bones: 1.0 by 1.4 cm non-fragmented osteochondral lesion versus subcortical stress fracture anteriorly in the medial femoral condyle with surrounding marrow edema as on image 13 series 11 Other: No supplemental non-categorized findings. IMPRESSION: 1. Moderate to large knee effusion, but with only thin synovial enhancement. This is unlikely to be a septic joint given the thin nature of the synovial enhancement. 2. Non-fragmented osteochondral lesion versus subcortical stress fracture anteriorly in the medial femoral condyle with surrounding marrow edema. 3. Degenerative tearing of the posterior horn  medial meniscus likely extending into the meniscal root. 4. Prominent expansion of the posterior horn lateral meniscus extending towards the meniscal root suggesting prominent degeneration. 5. Moderate to severe chondral thinning in the patellofemoral compartment with mild chondral thinning in the medial and lateral compartments. 6. Possible focal central tendinopathy in the distal patellar tendon. Electronically Signed   By: Gaylyn Rong M.D.   On: 11/07/2022 18:54   ECHOCARDIOGRAM COMPLETE  Result Date: 11/07/2022    ECHOCARDIOGRAM REPORT   Patient Name:   THERRON SCHORSCH Date of Exam: 11/07/2022 Medical Rec #:  [id number]        Height:       71.0 in Accession #:    [id number]       Weight:       134.9 lb Date of Birth:  Jul 02, 1945       BSA:          1.784 m Patient Age:    76 years         BP:           139/76 mmHg Patient Gender: M                HR:           115 bpm. Exam Location:  Inpatient Procedure: 2D Echo, Cardiac Doppler and Color Doppler Indications:    Syncope R55  History:        Patient has no prior history of Echocardiogram examinations.                 COPD; Risk Factors:Hypertension and Dyslipidemia.  Sonographer:    Harriette Bouillon RDCS Referring  Phys: [id number] CAROLE N HALL IMPRESSIONS  1. Left ventricular ejection fraction, by estimation, is 60 to 65%. The left ventricle has normal function. The left ventricle has no regional wall motion abnormalities. Left ventricular diastolic parameters are consistent with Grade II diastolic dysfunction (pseudonormalization).  2. Right ventricular systolic function is normal. The right ventricular size is normal. There is normal pulmonary artery systolic pressure.  3. The mitral valve is normal in structure. No evidence of mitral valve regurgitation. No evidence of mitral stenosis.  4. The aortic valve is tricuspid. Aortic valve regurgitation is not visualized. No aortic stenosis is present.  5. The inferior vena cava is normal in size with greater  than 50% respiratory variability, suggesting right atrial pressure of 3 mmHg. FINDINGS  Left Ventricle: Left ventricular ejection fraction, by estimation, is 60 to 65%. The left ventricle has normal function. The left ventricle has no regional wall motion abnormalities. The left ventricular internal cavity size was normal in size. There is  no left ventricular hypertrophy. Left ventricular diastolic parameters are consistent with Grade II diastolic dysfunction (pseudonormalization). Right Ventricle: The right ventricular size is normal. No increase in right ventricular wall thickness. Right ventricular systolic function is normal. There is normal pulmonary artery systolic pressure. The tricuspid regurgitant velocity is 2.00 m/s, and  with an assumed right atrial pressure of 3 mmHg, the estimated right ventricular systolic pressure is 19.0 mmHg. Left Atrium: Left atrial size was normal in size. Right Atrium: Right atrial size was normal in size. Pericardium: There is no evidence of pericardial effusion. Mitral Valve: The mitral valve is normal in structure. No evidence of mitral valve regurgitation. No evidence of mitral valve stenosis. Tricuspid Valve: The tricuspid valve is normal in structure. Tricuspid valve regurgitation is trivial. No evidence of tricuspid stenosis. Aortic Valve: The aortic valve is tricuspid. Aortic valve regurgitation is not visualized. No aortic stenosis is present. Pulmonic Valve: The pulmonic valve was normal in structure. Pulmonic valve regurgitation is not visualized. No evidence of pulmonic stenosis. Aorta: The aortic root is normal in size and structure. Venous: The inferior vena cava is normal in size with greater than 50% respiratory variability, suggesting right atrial pressure of 3 mmHg. IAS/Shunts: No atrial level shunt detected by color flow Doppler.  LEFT VENTRICLE PLAX 2D LVIDd:         5.20 cm LVIDs:         4.00 cm LV PW:         1.10 cm LV IVS:        1.10 cm LVOT diam:      2.20 cm LV SV:         55 LV SV Index:   31 LVOT Area:     3.80 cm  IVC IVC diam: 1.20 cm LEFT ATRIUM         Index LA diam:    3.70 cm 2.07 cm/m  AORTIC VALVE LVOT Vmax:   97.90 cm/s LVOT Vmean:  70.600 cm/s LVOT VTI:    0.145 m  AORTA Ao Root diam: 3.10 cm Ao Asc diam:  3.40 cm MITRAL VALVE               TRICUSPID VALVE MV Area (PHT): 5.06 cm    TR Peak grad:   16.0 mmHg MV Decel Time: 150 msec    TR Vmax:        200.00 cm/s MV E velocity: 85.10 cm/s MV A velocity: 46.50 cm/s  SHUNTS MV E/A ratio:  1.83  Systemic VTI:  0.14 m                            Systemic Diam: 2.20 cm Chilton Si MD Electronically signed by Chilton Si MD Signature Date/Time: 11/07/2022/11:51:26 AM    Final    US RENAL  Result Date: 11/06/2022 CLINICAL DATA:  Acute kidney insufficiency EXAM: RENAL / URINARY TRACT ULTRASOUND COMPLETE COMPARISON:  CT 10/27/2022 FINDINGS: Right Kidney: Renal measurements: 11.4 x 6.7 x 5.5 cm = volume: 220.2 mL. No collecting system dilatation. Anechoic structure seen consistent with a cyst towards the lower pole measuring 2.1 cm. Left Kidney: Renal measurements: 10.5 x 6.5 x 5.6 cm = volume: 201.4 mL. Multiple cysts are identified which appears simple as seen on CT. Example measures up to 6 cm. No collecting system dilatation. Bladder: Bladder is distended. The bladder lumen has layering and floating debris. Other: None. IMPRESSION: No collecting system dilatation. Bilateral simple appearing renal cysts by ultrasound. Please correlate with the prior CT. Distended bladder with luminal floating and layering debris. Please correlate with clinical findings. Electronically Signed   By: Karen Kays M.D.   On: 11/06/2022 15:04    Review of Systems  HENT:  Negative for ear discharge, ear pain, hearing loss and tinnitus.   Eyes:  Negative for photophobia and pain.  Respiratory:  Negative for cough and shortness of breath.   Cardiovascular:  Negative for chest pain.  Gastrointestinal:   Negative for abdominal pain, nausea and vomiting.  Genitourinary:  Negative for dysuria, flank pain, frequency and urgency.  Musculoskeletal:  Positive for arthralgias (Left wrist, right knee, right index finger). Negative for back pain, myalgias and neck pain.  Neurological:  Negative for dizziness and headaches.  Hematological:  Does not bruise/bleed easily.  Psychiatric/Behavioral:  The patient is not nervous/anxious.    Blood pressure (!) 147/86, pulse (!) 120, temperature 98.9 F (37.2 C), temperature source Oral, resp. rate 16, height 5\' 11"  (1.803 m), weight 61.2 kg, SpO2 96%. Physical Exam Constitutional:      General: He is not in acute distress.    Appearance: He is well-developed. He is not diaphoretic.  HENT:     Head: Normocephalic and atraumatic.  Eyes:     General: No scleral icterus.       Right eye: No discharge.        Left eye: No discharge.     Conjunctiva/sclera: Conjunctivae normal.  Cardiovascular:     Rate and Rhythm: Normal rate and regular rhythm.  Pulmonary:     Effort: Pulmonary effort is normal. No respiratory distress.  Musculoskeletal:     Cervical back: Normal range of motion.     Comments: Left shoulder, elbow, wrist, digits- no skin wounds, mod TTP, severe pain with wrist AROM, painless PROM from 45 flexion to 15 ext but pain at extremes, no instability, no blocks to motion  Sens  Ax/R/M/U intact  Mot   Ax/ R/ PIN/ M/ AIN/ U intact  Rad 2+  RLE No traumatic wounds, ecchymosis, or rash  Mild TTP, severe pain with AROM, mild pain with PROM from 170-100, pain at extremes  Mod knee effusion  Sens DPN paresthetic SPN, TN absent  Motor EHL, ext, flex, evers 5/5  DP 2+, PT 2+, No significant edema  Skin:    General: Skin is warm and dry.  Neurological:     Mental Status: He is alert.  Psychiatric:  Mood and Affect: Mood normal.        Behavior: Behavior normal.     Assessment/Plan: Polyarthralgia -- My concern for septic joints is low.  Will tap knee later today and ask IR to tap wrist. Wrist splint for comfort.    Freeman Caldron, PA-C Orthopedic Surgery [phone] 11/08/2022, 10:08 AM

## 2022-11-08 NOTE — Progress Notes (Signed)
Physical Therapy Treatment Patient Details Name: Michael Bender MRN: [id number] DOB: [birth year] Today's Date: 11/08/2022   History of Present Illness 77 y.o. male admitted 10/5 with syncope and new onset afib with RVR as well as generalized weakness and right index finger swelling and pain. R knee aspiration & injection 11/08/22 for R knee effusion. PMH: peripheral vascular disease, hypertension, hyperlipidemia, history of orthostatic hypotension, GERD, recently admitted to the hospital from 10/27/2022 through 11/02/22 due to multiple falls, traumatic rhabdomyolysis, AKI, acute urinary retention with bladder outlet obstruction, BPH, was offered SNF and home health PT but declined    PT Comments  Pt in bed upon arrival with son present. Pt did not want to get out of bed today and was feeling fatigued after R knee aspiration/injection today. Pt was agreeable to minimal LE exercises in supine. Pt mentioned L wrist pain and was not willing to move L UE besides a shoulder shrug. Pt required TotalAx2 to scoot pt towards HOB in supine. Pt is progressing slowly towards goals. Acute PT to follow.      If plan is discharge home, recommend the following: A little help with walking and/or transfers;A little help with bathing/dressing/bathroom;Help with stairs or ramp for entrance;Assistance with cooking/housework     Equipment Recommendations  Other (comment) (TBA)       Precautions / Restrictions Precautions Precautions: Fall Precaution Comments: many recent falls Restrictions Weight Bearing Restrictions: No     Mobility  Bed Mobility    General bed mobility comments: totalAx2 to scoot towards HOB in supine, pt declined further mobility due to R knee aspiration/injection today        Balance  Not assessed, pt in supine for exercises       Cognition Arousal: Alert Behavior During Therapy: Flat affect Overall Cognitive Status: No family/caregiver present to determine baseline cognitive  functioning Area of Impairment: Following commands, Safety/judgement, Problem solving      Following Commands: Follows one step commands with increased time Safety/Judgement: Decreased awareness of safety, Decreased awareness of deficits   Problem Solving: Slow processing General Comments: decr awareness of deficits/safety        Exercises General Exercises - Lower Extremity Ankle Circles/Pumps: AROM, Both, 10 reps, Supine Short Arc Quad: AROM, Supine, Both, 5 reps Hip ABduction/ADduction: AROM, Both, 5 reps, Supine Straight Leg Raises: AROM, Supine, Both, 5 reps    General Comments General comments (skin integrity, edema, etc.): VSS on RA, pt declined further mobility today      Pertinent Vitals/Pain Pain Assessment Pain Assessment: Faces Faces Pain Scale: Hurts little more Pain Location: L wrist Pain Descriptors / Indicators: Discomfort, Grimacing Pain Intervention(s): Limited activity within patient's tolerance, Monitored during session, Repositioned     PT Goals (current goals can now be found in the care plan section) Progress towards PT goals: Progressing toward goals    Frequency    Min 1X/week       AM-PAC PT "6 Clicks" Mobility   Outcome Measure  Help needed turning from your back to your side while in a flat bed without using bedrails?: A Little Help needed moving from lying on your back to sitting on the side of a flat bed without using bedrails?: A Little Help needed moving to and from a bed to a chair (including a wheelchair)?: A Little Help needed standing up from a chair using your arms (e.g., wheelchair or bedside chair)?: A Little Help needed to walk in hospital room?: A Lot Help needed climbing 3-5 steps with  a railing? : Total 6 Click Score: 15    End of Session   Activity Tolerance: Patient limited by pain Patient left: in bed;with family/visitor present;with call bell/phone within reach Nurse Communication: Mobility status PT Visit  Diagnosis: Unsteadiness on feet (R26.81);Muscle weakness (generalized) (M62.81)     Time: 1610-9604 PT Time Calculation (min) (ACUTE ONLY): 13 min  Charges:    $Therapeutic Exercise: 8-22 mins PT General Charges $$ ACUTE PT VISIT: 1 Visit                    [name], PT, DPT Secure Chat Preferred  Rehab Office [phone]    [name] [name] 11/08/2022, 1:12 PM

## 2022-11-08 NOTE — Progress Notes (Signed)
Notified [name] (brother) of the patient the patient condition and the procedure that is being done. Will continue to monitor patient.

## 2022-11-08 NOTE — Progress Notes (Signed)
PROGRESS NOTE    Michael Bender  VZD:[id number] DOB: 04-Jul-1945 DOA: 11/05/2022 PCP: Lula Olszewski, MD    Brief Narrative:  Michael Bender is a 77 y.o. male with medical history significant for peripheral vascular disease, hypertension, hyperlipidemia, history of orthostatic hypotension, GERD, recently admitted to the hospital from 10/27/2022 through 11/02/22 due to multiple falls, traumatic rhabdomyolysis, AKI, acute urinary retention with bladder outlet obstruction, BPH, was offered SNF and home health PT but declined, who presents from home with recurrent episodes of syncope, witnessed by family members.  Associated with generalized weakness.  Found to have bacteremia.  Unclear source thus far.     Assessment and Plan: Syncope due to orthostatic vital signs -PT/OT assessment -IV fluid hydration stopped-- continue to follow -suspected due to infection   Bacteremia- staph aureus -ID consulted -echo: diastolic dsfxn -IV abx per ID -MRI wrist/knee-- ortho consulted: Right knee aspiration and injection by ortho, IR to tap wrist  Elevated troponin, suspect demand ischemia No evidence of acute ischemia on 12 EKG  Newly diagnosed atrial fibrillation with RVR Admission 12-lead EKG with A-fib rate of 121 CHA2DS2-VASc score of 4. - 2D echo as above (diastolic dsfxn) -IV cardizem- added PO BB -would avoid blood thinner due to falls and orthostatics-- reconsider if less falls   Hypokalemia/Hypomagnesemia -replete aggressively    AKI, suspect prerenal in setting of dehydration from poor oral intake Serum creatinine appears to be 1.0 GFR greater than 60 Presented with creatinine of 1.87 with GFR of 37 IVF -renal U/S with distended bladder-- may need foley  -flomax -PVR//bladder scan prn   Physical debility PT/OT assessment Fall precautions.  Currently agreeable for SNF when medically ready  DVT prophylaxis: Place TED hose Start: 11/06/22 1024 enoxaparin (LOVENOX)  injection 30 mg Start: 11/06/22 1000    Code Status: Full Code Family Communication: son at bedside 10/7  Disposition Plan:  Level of care: Progressive Status is: Inpatient Remains inpatient appropriate     Consultants:  Palliative care   Subjective: Knee hurts  Objective: Vitals:   11/08/22 0356 11/08/22 0457 11/08/22 0557 11/08/22 1140  BP: (!) 148/88 120/71 (!) 147/86 134/83  Pulse: (!) 120   87  Resp: 16 15 16 16   Temp: 98.9 F (37.2 C)   98.4 F (36.9 C)  TempSrc: Oral   Oral  SpO2: 96%   92%  Weight:      Height:        Intake/Output Summary (Last 24 hours) at 11/08/2022 1239 Last data filed at 11/08/2022 0732 Gross per 24 hour  Intake 474 ml  Output 400 ml  Net 74 ml   Filed Weights   11/05/22 1949  Weight: 61.2 kg    Examination:   General: Appearance:    Thin male in no acute distress     Lungs:     respirations unlabored  Heart:    Normal heart rate. Normal rhythm. No murmurs, rubs, or gallops.   MS:   All extremities are intact.   Neurologic:   Awake, alert- hard of hearing       Data Reviewed: I have personally reviewed following labs and imaging studies  CBC: Recent Labs  Lab 11/05/22 2035 11/06/22 0903 11/07/22 0608  WBC 9.3 9.0 7.0  NEUTROABS 8.6*  --   --   HGB 9.3* 9.0* 8.6*  HCT 28.4* 26.8* 25.4*  MCV 92.5 92.4 90.4  PLT 146* 134* 125*   Basic Metabolic Panel: Recent Labs  Lab 11/02/22 0306 11/05/22  PROGRESS NOTE    Michael Bender  VZD:[id number] DOB: 04-Jul-1945 DOA: 11/05/2022 PCP: Lula Olszewski, MD    Brief Narrative:  Michael Bender is a 77 y.o. male with medical history significant for peripheral vascular disease, hypertension, hyperlipidemia, history of orthostatic hypotension, GERD, recently admitted to the hospital from 10/27/2022 through 11/02/22 due to multiple falls, traumatic rhabdomyolysis, AKI, acute urinary retention with bladder outlet obstruction, BPH, was offered SNF and home health PT but declined, who presents from home with recurrent episodes of syncope, witnessed by family members.  Associated with generalized weakness.  Found to have bacteremia.  Unclear source thus far.     Assessment and Plan: Syncope due to orthostatic vital signs -PT/OT assessment -IV fluid hydration stopped-- continue to follow -suspected due to infection   Bacteremia- staph aureus -ID consulted -echo: diastolic dsfxn -IV abx per ID -MRI wrist/knee-- ortho consulted: Right knee aspiration and injection by ortho, IR to tap wrist  Elevated troponin, suspect demand ischemia No evidence of acute ischemia on 12 EKG  Newly diagnosed atrial fibrillation with RVR Admission 12-lead EKG with A-fib rate of 121 CHA2DS2-VASc score of 4. - 2D echo as above (diastolic dsfxn) -IV cardizem- added PO BB -would avoid blood thinner due to falls and orthostatics-- reconsider if less falls   Hypokalemia/Hypomagnesemia -replete aggressively    AKI, suspect prerenal in setting of dehydration from poor oral intake Serum creatinine appears to be 1.0 GFR greater than 60 Presented with creatinine of 1.87 with GFR of 37 IVF -renal U/S with distended bladder-- may need foley  -flomax -PVR//bladder scan prn   Physical debility PT/OT assessment Fall precautions.  Currently agreeable for SNF when medically ready  DVT prophylaxis: Place TED hose Start: 11/06/22 1024 enoxaparin (LOVENOX)  injection 30 mg Start: 11/06/22 1000    Code Status: Full Code Family Communication: son at bedside 10/7  Disposition Plan:  Level of care: Progressive Status is: Inpatient Remains inpatient appropriate     Consultants:  Palliative care   Subjective: Knee hurts  Objective: Vitals:   11/08/22 0356 11/08/22 0457 11/08/22 0557 11/08/22 1140  BP: (!) 148/88 120/71 (!) 147/86 134/83  Pulse: (!) 120   87  Resp: 16 15 16 16   Temp: 98.9 F (37.2 C)   98.4 F (36.9 C)  TempSrc: Oral   Oral  SpO2: 96%   92%  Weight:      Height:        Intake/Output Summary (Last 24 hours) at 11/08/2022 1239 Last data filed at 11/08/2022 0732 Gross per 24 hour  Intake 474 ml  Output 400 ml  Net 74 ml   Filed Weights   11/05/22 1949  Weight: 61.2 kg    Examination:   General: Appearance:    Thin male in no acute distress     Lungs:     respirations unlabored  Heart:    Normal heart rate. Normal rhythm. No murmurs, rubs, or gallops.   MS:   All extremities are intact.   Neurologic:   Awake, alert- hard of hearing       Data Reviewed: I have personally reviewed following labs and imaging studies  CBC: Recent Labs  Lab 11/05/22 2035 11/06/22 0903 11/07/22 0608  WBC 9.3 9.0 7.0  NEUTROABS 8.6*  --   --   HGB 9.3* 9.0* 8.6*  HCT 28.4* 26.8* 25.4*  MCV 92.5 92.4 90.4  PLT 146* 134* 125*   Basic Metabolic Panel: Recent Labs  Lab 11/02/22 0306 11/05/22  PROGRESS NOTE    Michael Bender  VZD:[id number] DOB: 04-Jul-1945 DOA: 11/05/2022 PCP: Lula Olszewski, MD    Brief Narrative:  Michael Bender is a 77 y.o. male with medical history significant for peripheral vascular disease, hypertension, hyperlipidemia, history of orthostatic hypotension, GERD, recently admitted to the hospital from 10/27/2022 through 11/02/22 due to multiple falls, traumatic rhabdomyolysis, AKI, acute urinary retention with bladder outlet obstruction, BPH, was offered SNF and home health PT but declined, who presents from home with recurrent episodes of syncope, witnessed by family members.  Associated with generalized weakness.  Found to have bacteremia.  Unclear source thus far.     Assessment and Plan: Syncope due to orthostatic vital signs -PT/OT assessment -IV fluid hydration stopped-- continue to follow -suspected due to infection   Bacteremia- staph aureus -ID consulted -echo: diastolic dsfxn -IV abx per ID -MRI wrist/knee-- ortho consulted: Right knee aspiration and injection by ortho, IR to tap wrist  Elevated troponin, suspect demand ischemia No evidence of acute ischemia on 12 EKG  Newly diagnosed atrial fibrillation with RVR Admission 12-lead EKG with A-fib rate of 121 CHA2DS2-VASc score of 4. - 2D echo as above (diastolic dsfxn) -IV cardizem- added PO BB -would avoid blood thinner due to falls and orthostatics-- reconsider if less falls   Hypokalemia/Hypomagnesemia -replete aggressively    AKI, suspect prerenal in setting of dehydration from poor oral intake Serum creatinine appears to be 1.0 GFR greater than 60 Presented with creatinine of 1.87 with GFR of 37 IVF -renal U/S with distended bladder-- may need foley  -flomax -PVR//bladder scan prn   Physical debility PT/OT assessment Fall precautions.  Currently agreeable for SNF when medically ready  DVT prophylaxis: Place TED hose Start: 11/06/22 1024 enoxaparin (LOVENOX)  injection 30 mg Start: 11/06/22 1000    Code Status: Full Code Family Communication: son at bedside 10/7  Disposition Plan:  Level of care: Progressive Status is: Inpatient Remains inpatient appropriate     Consultants:  Palliative care   Subjective: Knee hurts  Objective: Vitals:   11/08/22 0356 11/08/22 0457 11/08/22 0557 11/08/22 1140  BP: (!) 148/88 120/71 (!) 147/86 134/83  Pulse: (!) 120   87  Resp: 16 15 16 16   Temp: 98.9 F (37.2 C)   98.4 F (36.9 C)  TempSrc: Oral   Oral  SpO2: 96%   92%  Weight:      Height:        Intake/Output Summary (Last 24 hours) at 11/08/2022 1239 Last data filed at 11/08/2022 0732 Gross per 24 hour  Intake 474 ml  Output 400 ml  Net 74 ml   Filed Weights   11/05/22 1949  Weight: 61.2 kg    Examination:   General: Appearance:    Thin male in no acute distress     Lungs:     respirations unlabored  Heart:    Normal heart rate. Normal rhythm. No murmurs, rubs, or gallops.   MS:   All extremities are intact.   Neurologic:   Awake, alert- hard of hearing       Data Reviewed: I have personally reviewed following labs and imaging studies  CBC: Recent Labs  Lab 11/05/22 2035 11/06/22 0903 11/07/22 0608  WBC 9.3 9.0 7.0  NEUTROABS 8.6*  --   --   HGB 9.3* 9.0* 8.6*  HCT 28.4* 26.8* 25.4*  MCV 92.5 92.4 90.4  PLT 146* 134* 125*   Basic Metabolic Panel: Recent Labs  Lab 11/02/22 0306 11/05/22  PROGRESS NOTE    Michael Bender  VZD:[id number] DOB: 04-Jul-1945 DOA: 11/05/2022 PCP: Lula Olszewski, MD    Brief Narrative:  Michael Bender is a 77 y.o. male with medical history significant for peripheral vascular disease, hypertension, hyperlipidemia, history of orthostatic hypotension, GERD, recently admitted to the hospital from 10/27/2022 through 11/02/22 due to multiple falls, traumatic rhabdomyolysis, AKI, acute urinary retention with bladder outlet obstruction, BPH, was offered SNF and home health PT but declined, who presents from home with recurrent episodes of syncope, witnessed by family members.  Associated with generalized weakness.  Found to have bacteremia.  Unclear source thus far.     Assessment and Plan: Syncope due to orthostatic vital signs -PT/OT assessment -IV fluid hydration stopped-- continue to follow -suspected due to infection   Bacteremia- staph aureus -ID consulted -echo: diastolic dsfxn -IV abx per ID -MRI wrist/knee-- ortho consulted: Right knee aspiration and injection by ortho, IR to tap wrist  Elevated troponin, suspect demand ischemia No evidence of acute ischemia on 12 EKG  Newly diagnosed atrial fibrillation with RVR Admission 12-lead EKG with A-fib rate of 121 CHA2DS2-VASc score of 4. - 2D echo as above (diastolic dsfxn) -IV cardizem- added PO BB -would avoid blood thinner due to falls and orthostatics-- reconsider if less falls   Hypokalemia/Hypomagnesemia -replete aggressively    AKI, suspect prerenal in setting of dehydration from poor oral intake Serum creatinine appears to be 1.0 GFR greater than 60 Presented with creatinine of 1.87 with GFR of 37 IVF -renal U/S with distended bladder-- may need foley  -flomax -PVR//bladder scan prn   Physical debility PT/OT assessment Fall precautions.  Currently agreeable for SNF when medically ready  DVT prophylaxis: Place TED hose Start: 11/06/22 1024 enoxaparin (LOVENOX)  injection 30 mg Start: 11/06/22 1000    Code Status: Full Code Family Communication: son at bedside 10/7  Disposition Plan:  Level of care: Progressive Status is: Inpatient Remains inpatient appropriate     Consultants:  Palliative care   Subjective: Knee hurts  Objective: Vitals:   11/08/22 0356 11/08/22 0457 11/08/22 0557 11/08/22 1140  BP: (!) 148/88 120/71 (!) 147/86 134/83  Pulse: (!) 120   87  Resp: 16 15 16 16   Temp: 98.9 F (37.2 C)   98.4 F (36.9 C)  TempSrc: Oral   Oral  SpO2: 96%   92%  Weight:      Height:        Intake/Output Summary (Last 24 hours) at 11/08/2022 1239 Last data filed at 11/08/2022 0732 Gross per 24 hour  Intake 474 ml  Output 400 ml  Net 74 ml   Filed Weights   11/05/22 1949  Weight: 61.2 kg    Examination:   General: Appearance:    Thin male in no acute distress     Lungs:     respirations unlabored  Heart:    Normal heart rate. Normal rhythm. No murmurs, rubs, or gallops.   MS:   All extremities are intact.   Neurologic:   Awake, alert- hard of hearing       Data Reviewed: I have personally reviewed following labs and imaging studies  CBC: Recent Labs  Lab 11/05/22 2035 11/06/22 0903 11/07/22 0608  WBC 9.3 9.0 7.0  NEUTROABS 8.6*  --   --   HGB 9.3* 9.0* 8.6*  HCT 28.4* 26.8* 25.4*  MCV 92.5 92.4 90.4  PLT 146* 134* 125*   Basic Metabolic Panel: Recent Labs  Lab 11/02/22 0306 11/05/22  Advanced degenerative arthropathy of the first carpometacarpal articulation. Mild degenerative arthropathy between the scaphoid and trapezium. Electronically Signed   By: Gaylyn Rong M.D.   On: 11/07/2022 19:02   MR KNEE RIGHT W WO CONTRAST  Result Date: 11/07/2022 CLINICAL DATA:  Bacteremia and knee tenderness EXAM: MRI OF THE RIGHT KNEE WITHOUT AND WITH CONTRAST TECHNIQUE: Multiplanar, multisequence MR imaging of the knee was performed before and after the administration of intravenous contrast. CONTRAST:  6mL GADAVIST GADOBUTROL 1 MMOL/ML IV SOLN COMPARISON:  None Available. FINDINGS: Despite efforts by the technologist and patient, motion artifact is present on today's  exam and could not be eliminated. This reduces exam sensitivity and specificity. MENISCI Medial meniscus: Degenerative tearing of the posterior horn likely extending into the meniscal root. Mild grade 3 signal in the posterior horn extends to the inferior surface as on image 28 series 7. Lateral meniscus: Prominent expansion of the posterior horn extending towards the meniscal root suggesting prominent degeneration. LIGAMENTS Cruciates:  Unremarkable Collaterals:  Unremarkable CARTILAGE Patellofemoral: Moderate to severe chondral thinning with marginal spurring. Medial:  Mild chondral thinning. Lateral:  Mild chondral thinning. Joint: Moderate to large knee effusion. Only thin synovial enhancement. This is unlikely to be a septic joint given the thin nature of the synovial enhancement. Thin medial plica noted. Popliteal Fossa:  Unremarkable Extensor Mechanism: Possible focal central tendinopathy in the distal patellar tendon on image 13 series 7. Bones: 1.0 by 1.4 cm non-fragmented osteochondral lesion versus subcortical stress fracture anteriorly in the medial femoral condyle with surrounding marrow edema as on image 13 series 11 Other: No supplemental non-categorized findings. IMPRESSION: 1. Moderate to large knee effusion, but with only thin synovial enhancement. This is unlikely to be a septic joint given the thin nature of the synovial enhancement. 2. Non-fragmented osteochondral lesion versus subcortical stress fracture anteriorly in the medial femoral condyle with surrounding marrow edema. 3. Degenerative tearing of the posterior horn medial meniscus likely extending into the meniscal root. 4. Prominent expansion of the posterior horn lateral meniscus extending towards the meniscal root suggesting prominent degeneration. 5. Moderate to severe chondral thinning in the patellofemoral compartment with mild chondral thinning in the medial and lateral compartments. 6. Possible focal central tendinopathy in the  distal patellar tendon. Electronically Signed   By: Gaylyn Rong M.D.   On: 11/07/2022 18:54   ECHOCARDIOGRAM COMPLETE  Result Date: 11/07/2022    ECHOCARDIOGRAM REPORT   Patient Name:   Michael Bender Date of Exam: 11/07/2022 Medical Rec #:  [id number]        Height:       71.0 in Accession #:    [id number]       Weight:       134.9 lb Date of Birth:  [birth year]       BSA:          1.784 m Patient Age:    76 years         BP:           139/76 mmHg Patient Gender: M                HR:           115 bpm. Exam Location:  Inpatient Procedure: 2D Echo, Cardiac Doppler and Color Doppler Indications:    Syncope R55  History:        Patient has no prior history of Echocardiogram examinations.  PROGRESS NOTE    Michael Bender  VZD:[id number] DOB: 04-Jul-1945 DOA: 11/05/2022 PCP: Lula Olszewski, MD    Brief Narrative:  Michael Bender is a 77 y.o. male with medical history significant for peripheral vascular disease, hypertension, hyperlipidemia, history of orthostatic hypotension, GERD, recently admitted to the hospital from 10/27/2022 through 11/02/22 due to multiple falls, traumatic rhabdomyolysis, AKI, acute urinary retention with bladder outlet obstruction, BPH, was offered SNF and home health PT but declined, who presents from home with recurrent episodes of syncope, witnessed by family members.  Associated with generalized weakness.  Found to have bacteremia.  Unclear source thus far.     Assessment and Plan: Syncope due to orthostatic vital signs -PT/OT assessment -IV fluid hydration stopped-- continue to follow -suspected due to infection   Bacteremia- staph aureus -ID consulted -echo: diastolic dsfxn -IV abx per ID -MRI wrist/knee-- ortho consulted: Right knee aspiration and injection by ortho, IR to tap wrist  Elevated troponin, suspect demand ischemia No evidence of acute ischemia on 12 EKG  Newly diagnosed atrial fibrillation with RVR Admission 12-lead EKG with A-fib rate of 121 CHA2DS2-VASc score of 4. - 2D echo as above (diastolic dsfxn) -IV cardizem- added PO BB -would avoid blood thinner due to falls and orthostatics-- reconsider if less falls   Hypokalemia/Hypomagnesemia -replete aggressively    AKI, suspect prerenal in setting of dehydration from poor oral intake Serum creatinine appears to be 1.0 GFR greater than 60 Presented with creatinine of 1.87 with GFR of 37 IVF -renal U/S with distended bladder-- may need foley  -flomax -PVR//bladder scan prn   Physical debility PT/OT assessment Fall precautions.  Currently agreeable for SNF when medically ready  DVT prophylaxis: Place TED hose Start: 11/06/22 1024 enoxaparin (LOVENOX)  injection 30 mg Start: 11/06/22 1000    Code Status: Full Code Family Communication: son at bedside 10/7  Disposition Plan:  Level of care: Progressive Status is: Inpatient Remains inpatient appropriate     Consultants:  Palliative care   Subjective: Knee hurts  Objective: Vitals:   11/08/22 0356 11/08/22 0457 11/08/22 0557 11/08/22 1140  BP: (!) 148/88 120/71 (!) 147/86 134/83  Pulse: (!) 120   87  Resp: 16 15 16 16   Temp: 98.9 F (37.2 C)   98.4 F (36.9 C)  TempSrc: Oral   Oral  SpO2: 96%   92%  Weight:      Height:        Intake/Output Summary (Last 24 hours) at 11/08/2022 1239 Last data filed at 11/08/2022 0732 Gross per 24 hour  Intake 474 ml  Output 400 ml  Net 74 ml   Filed Weights   11/05/22 1949  Weight: 61.2 kg    Examination:   General: Appearance:    Thin male in no acute distress     Lungs:     respirations unlabored  Heart:    Normal heart rate. Normal rhythm. No murmurs, rubs, or gallops.   MS:   All extremities are intact.   Neurologic:   Awake, alert- hard of hearing       Data Reviewed: I have personally reviewed following labs and imaging studies  CBC: Recent Labs  Lab 11/05/22 2035 11/06/22 0903 11/07/22 0608  WBC 9.3 9.0 7.0  NEUTROABS 8.6*  --   --   HGB 9.3* 9.0* 8.6*  HCT 28.4* 26.8* 25.4*  MCV 92.5 92.4 90.4  PLT 146* 134* 125*   Basic Metabolic Panel: Recent Labs  Lab 11/02/22 0306 11/05/22  Advanced degenerative arthropathy of the first carpometacarpal articulation. Mild degenerative arthropathy between the scaphoid and trapezium. Electronically Signed   By: Gaylyn Rong M.D.   On: 11/07/2022 19:02   MR KNEE RIGHT W WO CONTRAST  Result Date: 11/07/2022 CLINICAL DATA:  Bacteremia and knee tenderness EXAM: MRI OF THE RIGHT KNEE WITHOUT AND WITH CONTRAST TECHNIQUE: Multiplanar, multisequence MR imaging of the knee was performed before and after the administration of intravenous contrast. CONTRAST:  6mL GADAVIST GADOBUTROL 1 MMOL/ML IV SOLN COMPARISON:  None Available. FINDINGS: Despite efforts by the technologist and patient, motion artifact is present on today's  exam and could not be eliminated. This reduces exam sensitivity and specificity. MENISCI Medial meniscus: Degenerative tearing of the posterior horn likely extending into the meniscal root. Mild grade 3 signal in the posterior horn extends to the inferior surface as on image 28 series 7. Lateral meniscus: Prominent expansion of the posterior horn extending towards the meniscal root suggesting prominent degeneration. LIGAMENTS Cruciates:  Unremarkable Collaterals:  Unremarkable CARTILAGE Patellofemoral: Moderate to severe chondral thinning with marginal spurring. Medial:  Mild chondral thinning. Lateral:  Mild chondral thinning. Joint: Moderate to large knee effusion. Only thin synovial enhancement. This is unlikely to be a septic joint given the thin nature of the synovial enhancement. Thin medial plica noted. Popliteal Fossa:  Unremarkable Extensor Mechanism: Possible focal central tendinopathy in the distal patellar tendon on image 13 series 7. Bones: 1.0 by 1.4 cm non-fragmented osteochondral lesion versus subcortical stress fracture anteriorly in the medial femoral condyle with surrounding marrow edema as on image 13 series 11 Other: No supplemental non-categorized findings. IMPRESSION: 1. Moderate to large knee effusion, but with only thin synovial enhancement. This is unlikely to be a septic joint given the thin nature of the synovial enhancement. 2. Non-fragmented osteochondral lesion versus subcortical stress fracture anteriorly in the medial femoral condyle with surrounding marrow edema. 3. Degenerative tearing of the posterior horn medial meniscus likely extending into the meniscal root. 4. Prominent expansion of the posterior horn lateral meniscus extending towards the meniscal root suggesting prominent degeneration. 5. Moderate to severe chondral thinning in the patellofemoral compartment with mild chondral thinning in the medial and lateral compartments. 6. Possible focal central tendinopathy in the  distal patellar tendon. Electronically Signed   By: Gaylyn Rong M.D.   On: 11/07/2022 18:54   ECHOCARDIOGRAM COMPLETE  Result Date: 11/07/2022    ECHOCARDIOGRAM REPORT   Patient Name:   Michael Bender Date of Exam: 11/07/2022 Medical Rec #:  [id number]        Height:       71.0 in Accession #:    [id number]       Weight:       134.9 lb Date of Birth:  [birth year]       BSA:          1.784 m Patient Age:    76 years         BP:           139/76 mmHg Patient Gender: M                HR:           115 bpm. Exam Location:  Inpatient Procedure: 2D Echo, Cardiac Doppler and Color Doppler Indications:    Syncope R55  History:        Patient has no prior history of Echocardiogram examinations.  PROGRESS NOTE    Michael Bender  VZD:[id number] DOB: 04-Jul-1945 DOA: 11/05/2022 PCP: Lula Olszewski, MD    Brief Narrative:  Michael Bender is a 77 y.o. male with medical history significant for peripheral vascular disease, hypertension, hyperlipidemia, history of orthostatic hypotension, GERD, recently admitted to the hospital from 10/27/2022 through 11/02/22 due to multiple falls, traumatic rhabdomyolysis, AKI, acute urinary retention with bladder outlet obstruction, BPH, was offered SNF and home health PT but declined, who presents from home with recurrent episodes of syncope, witnessed by family members.  Associated with generalized weakness.  Found to have bacteremia.  Unclear source thus far.     Assessment and Plan: Syncope due to orthostatic vital signs -PT/OT assessment -IV fluid hydration stopped-- continue to follow -suspected due to infection   Bacteremia- staph aureus -ID consulted -echo: diastolic dsfxn -IV abx per ID -MRI wrist/knee-- ortho consulted: Right knee aspiration and injection by ortho, IR to tap wrist  Elevated troponin, suspect demand ischemia No evidence of acute ischemia on 12 EKG  Newly diagnosed atrial fibrillation with RVR Admission 12-lead EKG with A-fib rate of 121 CHA2DS2-VASc score of 4. - 2D echo as above (diastolic dsfxn) -IV cardizem- added PO BB -would avoid blood thinner due to falls and orthostatics-- reconsider if less falls   Hypokalemia/Hypomagnesemia -replete aggressively    AKI, suspect prerenal in setting of dehydration from poor oral intake Serum creatinine appears to be 1.0 GFR greater than 60 Presented with creatinine of 1.87 with GFR of 37 IVF -renal U/S with distended bladder-- may need foley  -flomax -PVR//bladder scan prn   Physical debility PT/OT assessment Fall precautions.  Currently agreeable for SNF when medically ready  DVT prophylaxis: Place TED hose Start: 11/06/22 1024 enoxaparin (LOVENOX)  injection 30 mg Start: 11/06/22 1000    Code Status: Full Code Family Communication: son at bedside 10/7  Disposition Plan:  Level of care: Progressive Status is: Inpatient Remains inpatient appropriate     Consultants:  Palliative care   Subjective: Knee hurts  Objective: Vitals:   11/08/22 0356 11/08/22 0457 11/08/22 0557 11/08/22 1140  BP: (!) 148/88 120/71 (!) 147/86 134/83  Pulse: (!) 120   87  Resp: 16 15 16 16   Temp: 98.9 F (37.2 C)   98.4 F (36.9 C)  TempSrc: Oral   Oral  SpO2: 96%   92%  Weight:      Height:        Intake/Output Summary (Last 24 hours) at 11/08/2022 1239 Last data filed at 11/08/2022 0732 Gross per 24 hour  Intake 474 ml  Output 400 ml  Net 74 ml   Filed Weights   11/05/22 1949  Weight: 61.2 kg    Examination:   General: Appearance:    Thin male in no acute distress     Lungs:     respirations unlabored  Heart:    Normal heart rate. Normal rhythm. No murmurs, rubs, or gallops.   MS:   All extremities are intact.   Neurologic:   Awake, alert- hard of hearing       Data Reviewed: I have personally reviewed following labs and imaging studies  CBC: Recent Labs  Lab 11/05/22 2035 11/06/22 0903 11/07/22 0608  WBC 9.3 9.0 7.0  NEUTROABS 8.6*  --   --   HGB 9.3* 9.0* 8.6*  HCT 28.4* 26.8* 25.4*  MCV 92.5 92.4 90.4  PLT 146* 134* 125*   Basic Metabolic Panel: Recent Labs  Lab 11/02/22 0306 11/05/22

## 2022-11-08 NOTE — Progress Notes (Signed)
Blood sugar elevated.  Suspect worsened by D5 in cardizem. Will need continued titration of insulin

## 2022-11-08 NOTE — Inpatient Diabetes Management (Signed)
Inpatient Diabetes Program Recommendations  AACE/ADA: New Consensus Statement on Inpatient Glycemic Control (2015)  Target Ranges:  Prepandial:   less than 140 mg/dL      Peak postprandial:   less than 180 mg/dL (1-2 hours)      Critically ill patients:  140 - 180 mg/dL   Lab Results  Component Value Date   HGBA1C 6.5 (H) 11/08/2022    Review of Glycemic Control  Latest Reference Range & Units 11/05/22 20:35 11/06/22 09:03 11/07/22 06:08 11/08/22 07:32  Glucose 70 - 99 mg/dL 161 (H) 096 (H) 045 (H) 321 (H)   Diabetes history: No history of DM Outpatient Diabetes medications: None Current orders for Inpatient glycemic control:  Novolog 0-15 units tid with meals and HS  Inpatient Diabetes Program Recommendations:    Note elevated glucose.  CBG's and correction added today.  A1C is only 6.5%.   Thanks,  Beryl Meager, RN, BC-ADM Inpatient Diabetes Coordinator Pager [phone]  (8a-5p)

## 2022-11-09 ENCOUNTER — Encounter (HOSPITAL_COMMUNITY)
Admission: EM | Disposition: A | Discharge status: Skilled Nursing Facility | Payer: Self-pay | Source: Home / Self Care | Attending: Internal Medicine

## 2022-11-09 ENCOUNTER — Inpatient Hospital Stay (HOSPITAL_COMMUNITY): Payer: Medicare HMO

## 2022-11-09 ENCOUNTER — Encounter (HOSPITAL_COMMUNITY): Payer: Self-pay | Admitting: Internal Medicine

## 2022-11-09 ENCOUNTER — Other Ambulatory Visit: Payer: Self-pay

## 2022-11-09 ENCOUNTER — Inpatient Hospital Stay (HOSPITAL_COMMUNITY): Payer: Medicare HMO | Admitting: Certified Registered Nurse Anesthetist

## 2022-11-09 DIAGNOSIS — R531 Weakness: Secondary | ICD-10-CM | POA: Diagnosis not present

## 2022-11-09 DIAGNOSIS — B9561 Methicillin susceptible Staphylococcus aureus infection as the cause of diseases classified elsewhere: Secondary | ICD-10-CM | POA: Diagnosis not present

## 2022-11-09 DIAGNOSIS — F17210 Nicotine dependence, cigarettes, uncomplicated: Secondary | ICD-10-CM

## 2022-11-09 DIAGNOSIS — I129 Hypertensive chronic kidney disease with stage 1 through stage 4 chronic kidney disease, or unspecified chronic kidney disease: Secondary | ICD-10-CM

## 2022-11-09 DIAGNOSIS — S60421A Blister (nonthermal) of left index finger, initial encounter: Secondary | ICD-10-CM | POA: Diagnosis not present

## 2022-11-09 DIAGNOSIS — N189 Chronic kidney disease, unspecified: Secondary | ICD-10-CM

## 2022-11-09 DIAGNOSIS — I4891 Unspecified atrial fibrillation: Secondary | ICD-10-CM | POA: Diagnosis not present

## 2022-11-09 DIAGNOSIS — M25432 Effusion, left wrist: Secondary | ICD-10-CM | POA: Diagnosis not present

## 2022-11-09 DIAGNOSIS — Z515 Encounter for palliative care: Secondary | ICD-10-CM | POA: Diagnosis not present

## 2022-11-09 DIAGNOSIS — R55 Syncope and collapse: Secondary | ICD-10-CM | POA: Diagnosis not present

## 2022-11-09 DIAGNOSIS — M25462 Effusion, left knee: Secondary | ICD-10-CM | POA: Diagnosis not present

## 2022-11-09 DIAGNOSIS — L089 Local infection of the skin and subcutaneous tissue, unspecified: Secondary | ICD-10-CM

## 2022-11-09 DIAGNOSIS — R7881 Bacteremia: Secondary | ICD-10-CM | POA: Diagnosis not present

## 2022-11-09 DIAGNOSIS — M00032 Staphylococcal arthritis, left wrist: Secondary | ICD-10-CM

## 2022-11-09 DIAGNOSIS — M009 Pyogenic arthritis, unspecified: Secondary | ICD-10-CM | POA: Diagnosis not present

## 2022-11-09 DIAGNOSIS — E1151 Type 2 diabetes mellitus with diabetic peripheral angiopathy without gangrene: Secondary | ICD-10-CM

## 2022-11-09 HISTORY — PX: I & D EXTREMITY: SHX5045

## 2022-11-09 LAB — BASIC METABOLIC PANEL
Anion gap: 9 (ref 5–15)
Anion gap: 12 (ref 5–15)
BUN: 20 mg/dL (ref 8–23)
BUN: 24 mg/dL — ABNORMAL HIGH (ref 8–23)
CO2: 24 mmol/L (ref 22–32)
CO2: 24 mmol/L (ref 22–32)
Calcium: 8.5 mg/dL — ABNORMAL LOW (ref 8.9–10.3)
Calcium: 8 mg/dL — ABNORMAL LOW (ref 8.9–10.3)
Chloride: 102 mmol/L (ref 98–111)
Chloride: 99 mmol/L (ref 98–111)
Creatinine, Ser: 1.27 mg/dL — ABNORMAL HIGH (ref 0.61–1.24)
Creatinine, Ser: 1.45 mg/dL — ABNORMAL HIGH (ref 0.61–1.24)
GFR, Estimated: 58 mL/min — ABNORMAL LOW (ref >60)
GFR, Estimated: 50 mL/min — ABNORMAL LOW (ref >60)
Glucose, Bld: 137 mg/dL — ABNORMAL HIGH (ref 70–99)
Glucose, Bld: 432 mg/dL — ABNORMAL HIGH (ref 70–99)
Potassium: 4 mmol/L (ref 3.5–5.1)
Potassium: 5 mmol/L (ref 3.5–5.1)
Sodium: 135 mmol/L (ref 135–145)
Sodium: 135 mmol/L (ref 135–145)

## 2022-11-09 LAB — SURGICAL PCR SCREEN
MRSA, PCR: NEGATIVE
Staphylococcus aureus: POSITIVE — AB

## 2022-11-09 LAB — CBC
HCT: 27.9 % — ABNORMAL LOW (ref 39.0–52.0)
Hemoglobin: 9.5 g/dL — ABNORMAL LOW (ref 13.0–17.0)
MCH: 31.4 pg (ref 26.0–34.0)
MCHC: 34.1 g/dL (ref 30.0–36.0)
MCV: 92.1 fL (ref 80.0–100.0)
Platelets: 161 10*3/uL (ref 150–400)
RBC: 3.03 MIL/uL — ABNORMAL LOW (ref 4.22–5.81)
RDW: 13.2 % (ref 11.5–15.5)
WBC: 9.7 10*3/uL (ref 4.0–10.5)
nRBC: 0 % (ref 0.0–0.2)

## 2022-11-09 LAB — GLUCOSE, CAPILLARY
Glucose-Capillary: 162 mg/dL — ABNORMAL HIGH (ref 70–99)
Glucose-Capillary: 184 mg/dL — ABNORMAL HIGH (ref 70–99)
Glucose-Capillary: 416 mg/dL — ABNORMAL HIGH (ref 70–99)
Glucose-Capillary: 416 mg/dL — ABNORMAL HIGH (ref 70–99)
Glucose-Capillary: 203 mg/dL — ABNORMAL HIGH (ref 70–99)

## 2022-11-09 LAB — MAGNESIUM: Magnesium: 2 mg/dL (ref 1.7–2.4)

## 2022-11-09 SURGERY — IRRIGATION AND DEBRIDEMENT EXTREMITY
Anesthesia: General | Laterality: Left

## 2022-11-09 MED ORDER — PHENYLEPHRINE 80 MCG/ML (10ML) SYRINGE FOR IV PUSH (FOR BLOOD PRESSURE SUPPORT)
PREFILLED_SYRINGE | INTRAVENOUS | Status: DC | PRN
  Administered 2022-11-09: 240 ug via INTRAVENOUS
  Administered 2022-11-09: 160 ug via INTRAVENOUS
  Administered 2022-11-09: 240 ug via INTRAVENOUS
  Administered 2022-11-09: 160 ug via INTRAVENOUS

## 2022-11-09 MED ORDER — OXYCODONE HCL 5 MG PO TABS: 5.0000 mg | ORAL_TABLET | Freq: Once | ORAL | Status: DC | PRN

## 2022-11-09 MED ORDER — SODIUM CHLORIDE 0.9 % IR SOLN
Status: DC | PRN
  Administered 2022-11-09: 3000 mL

## 2022-11-09 MED ORDER — ACETAMINOPHEN 500 MG PO TABS: 1000.0000 mg | ORAL_TABLET | Freq: Once | ORAL | Status: DC | PRN

## 2022-11-09 MED ORDER — PHENYLEPHRINE HCL-NACL 20-0.9 MG/250ML-% IV SOLN
INTRAVENOUS | Status: DC | PRN
  Administered 2022-11-09: 40 ug/min via INTRAVENOUS

## 2022-11-09 MED ORDER — PROPOFOL 10 MG/ML IV BOLUS
INTRAVENOUS | Status: DC | PRN
  Administered 2022-11-09: 120 mg via INTRAVENOUS

## 2022-11-09 MED ORDER — SUCCINYLCHOLINE CHLORIDE 200 MG/10ML IV SOSY
PREFILLED_SYRINGE | INTRAVENOUS | Status: DC | PRN
  Administered 2022-11-09: 140 mg via INTRAVENOUS

## 2022-11-09 MED ORDER — SUGAMMADEX SODIUM 200 MG/2ML IV SOLN
INTRAVENOUS | Status: DC | PRN
  Administered 2022-11-09: 200 mg via INTRAVENOUS

## 2022-11-09 MED ORDER — ENOXAPARIN SODIUM 40 MG/0.4ML IJ SOSY
40.0000 mg | PREFILLED_SYRINGE | Freq: Every day | INTRAMUSCULAR | Status: DC
  Administered 2022-11-10 – 2022-11-14 (×5): 40 mg via SUBCUTANEOUS
  Filled 2022-11-09 (×5): qty 0.4

## 2022-11-09 MED ORDER — FENTANYL CITRATE (PF) 250 MCG/5ML IJ SOLN
INTRAMUSCULAR | Status: AC
  Filled 2022-11-09: qty 5

## 2022-11-09 MED ORDER — LIDOCAINE 2% (20 MG/ML) 5 ML SYRINGE: INTRAMUSCULAR | Status: DC | PRN

## 2022-11-09 MED ORDER — OXYCODONE HCL 5 MG PO TABS
10.0000 mg | ORAL_TABLET | ORAL | Status: DC | PRN
  Administered 2022-11-09 – 2022-11-13 (×10): 10 mg via ORAL
  Filled 2022-11-09 (×10): qty 2

## 2022-11-09 MED ORDER — GLUCERNA SHAKE PO LIQD
237.0000 mL | Freq: Three times a day (TID) | ORAL | Status: DC
  Administered 2022-11-09 – 2022-11-14 (×14): 237 mL via ORAL

## 2022-11-09 MED ORDER — FENTANYL CITRATE (PF) 250 MCG/5ML IJ SOLN
INTRAMUSCULAR | Status: DC | PRN
  Administered 2022-11-09 (×3): 50 ug via INTRAVENOUS

## 2022-11-09 MED ORDER — OXYCODONE HCL 5 MG/5ML PO SOLN: 5.0000 mg | Freq: Once | ORAL | Status: DC | PRN

## 2022-11-09 MED ORDER — LACTATED RINGERS IV SOLN: INTRAVENOUS | Status: DC | PRN

## 2022-11-09 MED ORDER — POLYETHYLENE GLYCOL 3350 17 G PO PACK
17.0000 g | PACK | Freq: Two times a day (BID) | ORAL | Status: AC
  Administered 2022-11-09 – 2022-11-10 (×4): 17 g via ORAL
  Filled 2022-11-09 (×4): qty 1

## 2022-11-09 MED ORDER — ONDANSETRON HCL 4 MG/2ML IJ SOLN
INTRAMUSCULAR | Status: DC | PRN
  Administered 2022-11-09: 4 mg via INTRAVENOUS

## 2022-11-09 MED ORDER — FENTANYL CITRATE (PF) 100 MCG/2ML IJ SOLN: 25.0000 ug | INTRAMUSCULAR | Status: DC | PRN

## 2022-11-09 MED ORDER — BUPIVACAINE HCL (PF) 0.25 % IJ SOLN
INTRAMUSCULAR | Status: AC
  Filled 2022-11-09: qty 30

## 2022-11-09 MED ORDER — DEXAMETHASONE SODIUM PHOSPHATE 10 MG/ML IJ SOLN
INTRAMUSCULAR | Status: DC | PRN
  Administered 2022-11-09: 4 mg via INTRAVENOUS

## 2022-11-09 MED ORDER — ACETAMINOPHEN 10 MG/ML IV SOLN: 1000.0000 mg | Freq: Once | INTRAVENOUS | Status: DC | PRN

## 2022-11-09 MED ORDER — INSULIN GLARGINE-YFGN 100 UNIT/ML ~~LOC~~ SOLN
10.0000 [IU] | Freq: Two times a day (BID) | SUBCUTANEOUS | Status: DC
  Administered 2022-11-09 (×2): 10 [IU] via SUBCUTANEOUS
  Filled 2022-11-09 (×4): qty 0.1

## 2022-11-09 MED ORDER — ROCURONIUM BROMIDE 10 MG/ML (PF) SYRINGE
PREFILLED_SYRINGE | INTRAVENOUS | Status: DC | PRN
  Administered 2022-11-09: 30 mg via INTRAVENOUS

## 2022-11-09 MED ORDER — ACETAMINOPHEN 160 MG/5ML PO SOLN: 1000.0000 mg | Freq: Once | ORAL | Status: DC | PRN

## 2022-11-09 MED ORDER — PROPOFOL 10 MG/ML IV BOLUS
INTRAVENOUS | Status: AC
  Filled 2022-11-09: qty 20

## 2022-11-09 MED ORDER — GADOBUTROL 1 MMOL/ML IV SOLN
6.0000 mL | Freq: Once | INTRAVENOUS | Status: AC | PRN
  Administered 2022-11-09: 6 mL via INTRAVENOUS

## 2022-11-09 SURGICAL SUPPLY — 37 items
BNDG CMPR 5X4 KNIT ELC UNQ LF (GAUZE/BANDAGES/DRESSINGS) ×2
BNDG ELASTIC 4INX 5YD STR LF (GAUZE/BANDAGES/DRESSINGS) IMPLANT
BNDG ELASTIC 4X5.8 VLCR STR LF (GAUZE/BANDAGES/DRESSINGS) ×1 IMPLANT
BNDG GAUZE DERMACEA FLUFF 4 (GAUZE/BANDAGES/DRESSINGS) ×3 IMPLANT
BNDG GZE DERMACEA 4 6PLY (GAUZE/BANDAGES/DRESSINGS) ×1
CORD BIPOLAR FORCEPS 12FT (ELECTRODE) ×1 IMPLANT
COVER SURGICAL LIGHT HANDLE (MISCELLANEOUS) ×1 IMPLANT
CUFF TOURN SGL QUICK 18X4 (TOURNIQUET CUFF) ×1 IMPLANT
DRSG XEROFORM 1X8 (GAUZE/BANDAGES/DRESSINGS) IMPLANT
GAUZE SPONGE 4X4 12PLY STRL (GAUZE/BANDAGES/DRESSINGS) ×1 IMPLANT
GAUZE XEROFORM 1X8 LF (GAUZE/BANDAGES/DRESSINGS) ×1 IMPLANT
GLOVE BIOGEL M 8.0 STRL (GLOVE) ×1 IMPLANT
GOWN STRL REUS W/ TWL LRG LVL3 (GOWN DISPOSABLE) ×1 IMPLANT
GOWN STRL REUS W/ TWL XL LVL3 (GOWN DISPOSABLE) ×2 IMPLANT
GOWN STRL REUS W/TWL LRG LVL3 (GOWN DISPOSABLE) ×1
GOWN STRL REUS W/TWL XL LVL3 (GOWN DISPOSABLE) ×1
KIT BASIN OR (CUSTOM PROCEDURE TRAY) ×1 IMPLANT
KIT TURNOVER KIT B (KITS) ×1 IMPLANT
MANIFOLD NEPTUNE II (INSTRUMENTS) ×1 IMPLANT
NDL HYPO 25GX1X1/2 BEV (NEEDLE) IMPLANT
NEEDLE HYPO 25GX1X1/2 BEV (NEEDLE) ×1 IMPLANT
NS IRRIG 1000ML POUR BTL (IV SOLUTION) ×1 IMPLANT
PACK ORTHO EXTREMITY (CUSTOM PROCEDURE TRAY) ×1 IMPLANT
PAD ARMBOARD 7.5X6 YLW CONV (MISCELLANEOUS) ×1 IMPLANT
SET CYSTO W/LG BORE CLAMP LF (SET/KITS/TRAYS/PACK) ×1 IMPLANT
SOL PREP POV-IOD 4OZ 10% (MISCELLANEOUS) ×2 IMPLANT
SPLINT PLASTER CAST FAST 4X15 (CAST SUPPLIES) IMPLANT
SPONGE T-LAP 4X18 ~~LOC~~+RFID (SPONGE) ×1 IMPLANT
SUT ETHILON 4 0 PS 2 18 (SUTURE) IMPLANT
SUT VIC AB 3-0 SH 27 (SUTURE) ×2
SUT VIC AB 3-0 SH 27X BRD (SUTURE) IMPLANT
SWAB COLLECTION DEVICE MRSA (MISCELLANEOUS) IMPLANT
SWAB CULTURE ESWAB REG 1ML (MISCELLANEOUS) IMPLANT
SYR CONTROL 10ML LL (SYRINGE) IMPLANT
TOWEL GREEN STERILE FF (TOWEL DISPOSABLE) ×1 IMPLANT
TUBE CONNECTING 12X1/4 (SUCTIONS) ×1 IMPLANT
YANKAUER SUCT BULB TIP NO VENT (SUCTIONS) ×1 IMPLANT

## 2022-11-09 NOTE — Progress Notes (Signed)
Patient underwent I&D with ortho this AM for left wrist septic arthritis.  Will cancel order for fluoro-guided wrist aspiration at this time.   Loyce Dys, MS RD PA-C

## 2022-11-09 NOTE — Progress Notes (Signed)
Patient ID: Michael Bender, male   DOB: 07-16-1945, 77 y.o.   MRN: [id number]    Progress Note from the Palliative Medicine Team at [facility]   Patient Name: Michael Bender        Date: 11/09/2022 DOB: [birth year]  Age: 77 y.o. MRN#: [id number] Attending Physician: Marinda Elk, MD Primary Care Physician: Lula Olszewski, MD Admit Date: 11/05/2022   Reason for Consultation/Follow-up   Establishing Goals of Care   HPI/ Brief Hospital Review  77 y.o. male   admitted on 11/05/2022 with past medical history significant for peripheral vascular disease, hypertension, hyperlipidemia, history of orthostatic hypotension, GERD, PAD, COPD, history of cervical/lumbar hardware , chonic pain (Dr. Vear Clock)  recently admitted to the hospital from 10/27/2022 through 11/02/22 due to multiple falls, traumatic rhabdomyolysis, AKI, acute urinary retention with bladder outlet obstruction, BPH, was offered SNF and home health PT but declined.  S/p right knee aspiration yesterday  S/P left wrist irrigation and debridement  Admitted from home with recurrent episodes of syncope, witnessed by family members.  Associated with generalized weakness.  Found to have bacteremia.     Patient and family face treatment option decisions, advanced directive decisions and anticipatory care needs.    Subjective  Extensive chart review has been completed prior to meeting with patient/family  including labs, vital signs, imaging, progress/consult notes, orders, medications and available advance directive documents.    This NP assessed patient at the bedside as a follow up to  yesterday's GOCs meeting.   I also spoke with his son Michael Bender     Patient is alert and oriented, reports his right knee "feels better today."   Reports overall pain control is acceptable.   Patient and family are hopeful for continued improvement.  Discussion today is awaiting bed authorization at [facility] for short-term rehab.  Ultimately  patient hopes to regain independence and return home..  Education offered today regarding  the importance of continued conversation with his  family and the medical providers regarding overall plan of care and treatment options,  ensuring decisions are within the context of the patients values and GOCs, now and into the future care.  Questions and concerns addressed   Discussed with primary team and nursing staff   Time: 25   minutes  Detailed review of medical records ( labs, imaging, vital signs), medically appropriate exam ( MS, skin, cardiac,  resp)   discussed with treatment team, counseling and education to patient, family, staff, documenting clinical information, medication management, coordination of care    Lorinda Creed NP  Palliative Medicine Team Team Phone # [phone] Pager [phone]

## 2022-11-09 NOTE — Progress Notes (Signed)
Subjective:  He is worried about his neck being infected as hurting him more.  He is agreeable to MRIs of the neck and hand today.    Antibiotics:  Anti-infectives (From admission, onward)    Start     Dose/Rate Route Frequency Ordered Stop   11/07/22 0100  ceFAZolin (ANCEF) IVPB 2g/100 mL premix        2 g 200 mL/hr over 30 Minutes Intravenous Every 8 hours 11/06/22 1540     11/06/22 0100  cefTRIAXone (ROCEPHIN) 2 g in sodium chloride 0.9 % 100 mL IVPB  Status:  Discontinued        2 g 200 mL/hr over 30 Minutes Intravenous Daily at bedtime 11/06/22 0041 11/06/22 1513       Medications: Scheduled Meds:  amitriptyline  25 mg Oral QHS   [START ON 11/10/2022] enoxaparin (LOVENOX) injection  40 mg Subcutaneous Daily   feeding supplement  237 mL Oral BID BM   insulin aspart  0-15 Units Subcutaneous TID WC   insulin aspart  0-5 Units Subcutaneous QHS   insulin glargine-yfgn  10 Units Subcutaneous Daily   metoprolol tartrate  25 mg Oral BID   polyethylene glycol  17 g Oral BID   tamsulosin  0.4 mg Oral QPC supper   Continuous Infusions:   ceFAZolin (ANCEF) IV 2 g (11/09/22 0552)   diltiazem (CARDIZEM) infusion 5 mg/hr (11/08/22 2000)   PRN Meds:.acetaminophen, melatonin, oxyCODONE, polyethylene glycol, prochlorperazine    Objective: Weight change:   Intake/Output Summary (Last 24 hours) at 11/09/2022 1426 Last data filed at 11/09/2022 0746 Gross per 24 hour  Intake 1557.59 ml  Output [address] ml  Net -9447.41 ml   Blood pressure 124/74, pulse 79, temperature 98.1 F (36.7 C), temperature source Oral, resp. rate 14, height 5\' 11"  (1.803 m), weight 61.2 kg, SpO2 92%. Temp:  [97.4 F (36.3 C)-98.6 F (37 C)] 98.1 F (36.7 C) (10/09 0844) Pulse Rate:  [79-88] 79 (10/09 0844) Resp:  [12-20] 14 (10/09 0844) BP: (106-147)/(57-78) 124/74 (10/09 0831) SpO2:  [92 %-100 %] 92 % (10/09 0831)  Physical Exam: Physical Exam Constitutional:      Appearance: He is  well-developed.  HENT:     Head: Normocephalic and atraumatic.  Eyes:     Conjunctiva/sclera: Conjunctivae normal.  Cardiovascular:     Rate and Rhythm: Normal rate and regular rhythm.  Pulmonary:     Effort: Pulmonary effort is normal. No respiratory distress.     Breath sounds: No wheezing.  Abdominal:     General: There is no distension.     Palpations: Abdomen is soft.  Musculoskeletal:        General: Swelling present.     Right wrist: Swelling and effusion present.     Cervical back: Normal range of motion and neck supple.  Skin:    General: Skin is warm and dry.     Findings: No erythema or rash.  Neurological:     General: No focal deficit present.     Mental Status: He is alert and oriented to person, place, and time.  Psychiatric:        Mood and Affect: Mood normal.        Behavior: Behavior normal.        Thought Content: Thought content normal.        Judgment: Judgment normal.     Left wrist dressed  CBC:    BMET Recent Labs    11/08/22  0732 11/08/22 1726 11/09/22 0437  NA 134*  --  135  K 2.9*  --  4.0  CL 95*  --  102  CO2 26  --  24  GLUCOSE 321* 405* 137*  BUN 18  --  20  CREATININE 1.32*  --  1.27*  CALCIUM 8.2*  --  8.5*     Liver Panel  No results for input(s): "PROT", "ALBUMIN", "AST", "ALT", "ALKPHOS", "BILITOT", "BILIDIR", "IBILI" in the last 72 hours.      Sedimentation Rate No results for input(s): "ESRSEDRATE" in the last 72 hours. C-Reactive Protein No results for input(s): "CRP" in the last 72 hours.  Micro Results: Recent Results (from the past 720 hour(s))  Culture, blood (Routine X 2) w Reflex to ID Panel     Status: Abnormal   Collection Time: 11/06/22  1:13 AM   Specimen: BLOOD RIGHT ARM  Result Value Ref Range Status   Specimen Description BLOOD RIGHT ARM  Final   Special Requests   Final    BOTTLES DRAWN AEROBIC AND ANAEROBIC Blood Culture results may not be optimal due to an excessive volume of blood  received in culture bottles   Culture  Setup Time   Final    GRAM POSITIVE COCCI IN BOTH AEROBIC AND ANAEROBIC BOTTLES Organism ID to follow CRITICAL RESULT CALLED TO, READ BACK BY AND VERIFIED WITH: PHARMD JESSICA MILLEN [id number] BY Berline Chough, [place] Performed at [facility], [address] [place], [place] [address]    Culture STAPHYLOCOCCUS AUREUS (A)  Final   Report Status 11/08/2022 FINAL  Final   Organism ID, Bacteria STAPHYLOCOCCUS AUREUS  Final      Susceptibility   Staphylococcus aureus - MIC*    CIPROFLOXACIN <=0.5 SENSITIVE Sensitive     ERYTHROMYCIN <=0.25 SENSITIVE Sensitive     GENTAMICIN <=0.5 SENSITIVE Sensitive     OXACILLIN 0.5 SENSITIVE Sensitive     TETRACYCLINE <=1 SENSITIVE Sensitive     VANCOMYCIN <=0.5 SENSITIVE Sensitive     TRIMETH/SULFA <=10 SENSITIVE Sensitive     CLINDAMYCIN <=0.25 SENSITIVE Sensitive     RIFAMPIN <=0.5 SENSITIVE Sensitive     Inducible Clindamycin NEGATIVE Sensitive     LINEZOLID 1 SENSITIVE Sensitive     * STAPHYLOCOCCUS AUREUS  Blood Culture ID Panel (Reflexed)     Status: Abnormal   Collection Time: 11/06/22  1:13 AM  Result Value Ref Range Status   Enterococcus faecalis NOT DETECTED NOT DETECTED Final   Enterococcus Faecium NOT DETECTED NOT DETECTED Final   Listeria monocytogenes NOT DETECTED NOT DETECTED Final   Staphylococcus species DETECTED (A) NOT DETECTED Final    Comment: CRITICAL RESULT CALLED TO, READ BACK BY AND VERIFIED WITH: PHARMD JESSICA MILLEN [phone] 1429 BY J RAZZAK, MT    Staphylococcus aureus (BCID) DETECTED (A) NOT DETECTED Final    Comment: CRITICAL RESULT CALLED TO, READ BACK BY AND VERIFIED WITH: PHARMD JESSICA MILLEN [phone] 1429 BY J RAZZAK, MT    Staphylococcus epidermidis NOT DETECTED NOT DETECTED Final   Staphylococcus lugdunensis NOT DETECTED NOT DETECTED Final   Streptococcus species NOT DETECTED NOT DETECTED Final   Streptococcus agalactiae NOT DETECTED NOT DETECTED Final    Streptococcus pneumoniae NOT DETECTED NOT DETECTED Final   Streptococcus pyogenes NOT DETECTED NOT DETECTED Final   A.calcoaceticus-baumannii NOT DETECTED NOT DETECTED Final   Bacteroides fragilis NOT DETECTED NOT DETECTED Final   Enterobacterales NOT DETECTED NOT DETECTED Final   Enterobacter cloacae complex NOT DETECTED NOT DETECTED  Final   Escherichia coli NOT DETECTED NOT DETECTED Final   Klebsiella aerogenes NOT DETECTED NOT DETECTED Final   Klebsiella oxytoca NOT DETECTED NOT DETECTED Final   Klebsiella pneumoniae NOT DETECTED NOT DETECTED Final   Proteus species NOT DETECTED NOT DETECTED Final   Salmonella species NOT DETECTED NOT DETECTED Final   Serratia marcescens NOT DETECTED NOT DETECTED Final   Haemophilus influenzae NOT DETECTED NOT DETECTED Final   Neisseria meningitidis NOT DETECTED NOT DETECTED Final   Pseudomonas aeruginosa NOT DETECTED NOT DETECTED Final   Stenotrophomonas maltophilia NOT DETECTED NOT DETECTED Final   Candida albicans NOT DETECTED NOT DETECTED Final   Candida auris NOT DETECTED NOT DETECTED Final   Candida glabrata NOT DETECTED NOT DETECTED Final   Candida krusei NOT DETECTED NOT DETECTED Final   Candida parapsilosis NOT DETECTED NOT DETECTED Final   Candida tropicalis NOT DETECTED NOT DETECTED Final   Cryptococcus neoformans/gattii NOT DETECTED NOT DETECTED Final   Meth resistant mecA/C and MREJ NOT DETECTED NOT DETECTED Final    Comment: Performed at [facility], [address], [place], [place] [address]  Culture, blood (Routine X 2) w Reflex to ID Panel     Status: Abnormal   Collection Time: 11/06/22  1:14 AM   Specimen: BLOOD RIGHT ARM  Result Value Ref Range Status   Specimen Description BLOOD RIGHT ARM  Final   Special Requests   Final    BOTTLES DRAWN AEROBIC AND ANAEROBIC Blood Culture results may not be optimal due to an excessive volume of blood received in culture bottles   Culture  Setup Time   Final    GRAM POSITIVE COCCI IN  BOTH AEROBIC AND ANAEROBIC BOTTLES    Culture (A)  Final    STAPHYLOCOCCUS AUREUS SUSCEPTIBILITIES PERFORMED ON PREVIOUS CULTURE WITHIN THE LAST 5 DAYS. Performed at [facility], [address] [place], [place] [address]    Report Status 11/08/2022 FINAL  Final  MRSA Next Gen by PCR, Nasal     Status: None   Collection Time: 11/06/22  1:29 AM   Specimen: Nasal Mucosa; Nasal Swab  Result Value Ref Range Status   MRSA by PCR Next Gen NOT DETECTED NOT DETECTED Final    Comment: (NOTE) The GeneXpert MRSA Assay (FDA approved for NASAL specimens only), is one component of a comprehensive MRSA colonization surveillance program. It is not intended to diagnose MRSA infection nor to guide or monitor treatment for MRSA infections. Test performance is not FDA approved in patients less than 53 years old. Performed at [facility], [address], [place], [place] [address]   SARS Coronavirus 2 by RT PCR (hospital order, performed in [facility] lab) *cepheid single result test* Anterior Nasal Swab     Status: None   Collection Time: 11/06/22  7:33 AM   Specimen: Anterior Nasal Swab  Result Value Ref Range Status   SARS Coronavirus 2 by RT PCR NEGATIVE NEGATIVE Final    Comment: Performed at [facility], [address] [place], [place] [address]  Respiratory (~20 pathogens) panel by PCR     Status: None   Collection Time: 11/06/22  7:34 AM   Specimen: Nasopharyngeal Swab; Respiratory  Result Value Ref Range Status   Adenovirus NOT DETECTED NOT DETECTED Final   Coronavirus 229E NOT DETECTED NOT DETECTED Final    Comment: (NOTE) The Coronavirus on the Respiratory Panel, DOES NOT test for the novel  Coronavirus (2019 nCoV)  Coronavirus HKU1 NOT DETECTED NOT DETECTED Final   Coronavirus NL63 NOT DETECTED NOT DETECTED Final   Coronavirus OC43 NOT DETECTED NOT DETECTED Final   Metapneumovirus NOT DETECTED NOT DETECTED Final   Rhinovirus / Enterovirus NOT  DETECTED NOT DETECTED Final   Influenza A NOT DETECTED NOT DETECTED Final   Influenza B NOT DETECTED NOT DETECTED Final   Parainfluenza Virus 1 NOT DETECTED NOT DETECTED Final   Parainfluenza Virus 2 NOT DETECTED NOT DETECTED Final   Parainfluenza Virus 3 NOT DETECTED NOT DETECTED Final   Parainfluenza Virus 4 NOT DETECTED NOT DETECTED Final   Respiratory Syncytial Virus NOT DETECTED NOT DETECTED Final   Bordetella pertussis NOT DETECTED NOT DETECTED Final   Bordetella Parapertussis NOT DETECTED NOT DETECTED Final   Chlamydophila pneumoniae NOT DETECTED NOT DETECTED Final   Mycoplasma pneumoniae NOT DETECTED NOT DETECTED Final    Comment: Performed at [facility], [address], [place], [place] [address]  Culture, blood (Routine X 2) w Reflex to ID Panel     Status: None (Preliminary result)   Collection Time: 11/07/22  6:08 AM   Specimen: BLOOD RIGHT ARM  Result Value Ref Range Status   Specimen Description BLOOD RIGHT ARM  Final   Special Requests   Final    BOTTLES DRAWN AEROBIC AND ANAEROBIC Blood Culture adequate volume   Culture   Final    NO GROWTH 2 DAYS Performed at [facility], [address], [place], [place] [address]    Report Status PENDING  Incomplete  Culture, blood (Routine X 2) w Reflex to ID Panel     Status: None (Preliminary result)   Collection Time: 11/07/22  6:08 AM   Specimen: BLOOD RIGHT HAND  Result Value Ref Range Status   Specimen Description BLOOD RIGHT HAND  Final   Special Requests   Final    BOTTLES DRAWN AEROBIC AND ANAEROBIC Blood Culture results may not be optimal due to an excessive volume of blood received in culture bottles   Culture   Final    NO GROWTH 2 DAYS Performed at [facility], [address], [place], [place] [address]    Report Status PENDING  Incomplete  Culture, blood (Routine X 2) w Reflex to ID Panel     Status: None (Preliminary result)   Collection Time: 11/07/22  9:19 PM   Specimen: BLOOD RIGHT HAND   Result Value Ref Range Status   Specimen Description BLOOD RIGHT HAND  Final   Special Requests   Final    BOTTLES DRAWN AEROBIC AND ANAEROBIC Blood Culture adequate volume   Culture   Final    NO GROWTH 2 DAYS Performed at [facility], [address] [place], [place] [address]    Report Status PENDING  Incomplete  Culture, blood (Routine X 2) w Reflex to ID Panel     Status: None (Preliminary result)   Collection Time: 11/07/22  9:19 PM   Specimen: BLOOD RIGHT HAND  Result Value Ref Range Status   Specimen Description BLOOD RIGHT HAND  Final   Special Requests   Final    BOTTLES DRAWN AEROBIC ONLY Blood Culture results may not be optimal due to an inadequate volume of blood received in culture bottles   Culture   Final    NO GROWTH 2 DAYS Performed at [facility], [address] [place], [place] [address]    Report Status PENDING  Incomplete  Body fluid  culture w Gram Stain     Status: None (Preliminary result)   Collection Time: 11/08/22 12:05 PM   Specimen: Body Fluid  Result Value Ref Range Status   Specimen Description FLUID SYNOVIAL RIGHT KNEE  Final   Special Requests NONE  Final   Gram Stain   Final    RARE WBC PRESENT, PREDOMINANTLY PMN NO ORGANISMS SEEN    Culture   Final    NO GROWTH < 24 HOURS Performed at [facility], [address], [place], [place] [address]    Report Status PENDING  Incomplete  Gram stain     Status: None   Collection Time: 11/08/22  6:50 PM   Specimen: Synovium; Body Fluid  Result Value Ref Range Status   Specimen Description SYNOVIAL  Final   Special Requests WRIST  Final   Gram Stain   Final    ABUNDANT WBC PRESENT, PREDOMINANTLY PMN NO ORGANISMS SEEN Performed at [facility], [address] [place], [place] [address]    Report Status 11/08/2022 FINAL  Final  Body fluid culture w Gram Stain     Status: None (Preliminary result)   Collection Time: 11/08/22  7:17 PM   Specimen: Synovium  Result Value  Ref Range Status   Specimen Description SYNOVIAL  Final   Special Requests WRIST  Final   Gram Stain   Final    ABUNDANT WBC PRESENT, PREDOMINANTLY PMN NO ORGANISMS SEEN    Culture   Final    NO GROWTH < 12 HOURS Performed at [facility], [address], [place], [place] [address]    Report Status PENDING  Incomplete  Surgical pcr screen     Status: Abnormal   Collection Time: 11/09/22  4:36 AM   Specimen: Nasal Mucosa; Nasal Swab  Result Value Ref Range Status   MRSA, PCR NEGATIVE NEGATIVE Final   Staphylococcus aureus POSITIVE (A) NEGATIVE Final    Comment: (NOTE) The Xpert SA Assay (FDA approved for NASAL specimens in patients 87 years of age and older), is one component of a comprehensive surveillance program. It is not intended to diagnose infection nor to guide or monitor treatment. Performed at [facility], [address] [place], [place] [address]     Studies/Results: [place] Finger Index Right  Result Date: 11/07/2022 CLINICAL DATA:  Osteomyelitis.  Pain. EXAM: RIGHT INDEX FINGER 2+V COMPARISON:  None Available. FINDINGS: Arthropathy of the distal interphalangeal joint with joint space narrowing and spurring. There may be a small erosion involving the radial aspect of the joint space. No frank bony destruction. The distal phalanx is dorsally subluxed, no frank dislocation. Joint space narrowing with subchondral cysts at the metacarpal phalangeal joint. Mild generalized soft tissue edema. No soft tissue gas or radiopaque foreign body. IMPRESSION: 1. Arthropathy of the distal interphalangeal joint with dorsal subluxation of the distal phalanx. There may be a small erosion involving the radial aspect of the joint space. This could represent infection in the appropriate clinical setting. No frank bony destruction. 2. Mild generalized soft tissue edema. Electronically Signed   By: Narda Rutherford M.D.   On: 11/07/2022 19:41      Assessment/Plan:  INTERVAL HISTORY:  Surgery attempted to aspirate his wrist at the bedside but that the material was too purulent to be sent for cell count differential because there was abundant white blood cells seen on Gram stain he was taken to the operating last night where he underwent left wrist arthrotomy  with irrigation and debridement in which copious pus was removed  Principal Problem:   MSSA bacteremia Active Problems:   Syncope   Septic infrapatellar bursitis of right knee   Staphylococcal arthritis of left wrist (HCC)   Infected blister of left index finger   Staphylococcal arthritis of right knee (HCC)   Neck pain   Finger pain, left   Septic arthritis of wrist, left (HCC)    Michael Bender is a 77 y.o. male with history of cervical lumbar spine hardware and recent admission for falls weakness and rhabdomyolysis who was discharged to skilled nursing facility but then brought back with sepsis due to MSSA bacteremia.  He had fallen but also headaches was at pain in his left wrist as well as pain in his right knee and index finger.  He has been proven to have a left septic wrist and now status post I&D.  I am confident his knee is also infected.  While the white blood cell count of the knee of 8500 in a person coming in de novo without antibiotics would not be compelling for a septic knee this man was on IV antibiotics since the 5th  I do not think we need to pursue TEE  We will order MRI of his C-spine as well as MRI of his finger to look for infection at the sites in this patient with metastatic MSSA infection  I have personally spent 52 minutes involved in face-to-face and non-face-to-face activities for this patient on the day of the visit. Professional time spent includes the following activities: Preparing to see the patient (review of tests), Obtaining and/or reviewing separately obtained history (admission/discharge record), Performing a medically appropriate examination and/or evaluation , Ordering  medications/tests/procedures, referring and communicating with other health care professionals, Documenting clinical information in the EMR, Independently interpreting results (not separately reported), Communicating results to the patient/family/caregiver, Counseling and educating the patient/family/caregiver and Care coordination (not separately reported).        LOS: 4 days   Acey Lav 11/09/2022, 2:26 PM

## 2022-11-09 NOTE — Progress Notes (Addendum)
Pharmacy Antibiotic Note  Michael Bender is a 77 y.o. male with MSSA bacteremia and left wrist septic arthritis. Pharmacy has been consulted for cefazolin dosing. ID following and may need antibiotics for 6 weeks  -WBC= 9.7, afebrile, SCr= 1.27, CrCl ~ 40 -s/p I&D 10/9   Plan: Cefazolin 2 g IV q8h Monitor renal function and adjust dosing if CrCl <30 ml/min Monitor clinical progress and cultures   Height: 5\' 11"  (180.3 cm) Weight: 61.2 kg (134 lb 14.7 oz) IBW/kg (Calculated) : 75.3  Temp (24hrs), Avg:98.2 F (36.8 C), Min:97.4 F (36.3 C), Max:98.6 F (37 C)  Recent Labs  Lab 11/05/22 2035 11/06/22 0903 11/07/22 0608 11/08/22 0732 11/09/22 0437  WBC 9.3 9.0 7.0  --  9.7  CREATININE 1.87* 1.70* 1.60* 1.32* 1.27*    Estimated Creatinine Clearance: 42.2 mL/min (A) (by C-G formula based on SCr of 1.27 mg/dL (H)).    Allergies  Allergen Reactions   Nubain [Nalbuphine Hcl]     Muscle contraction    Antimicrobials this admission: Ceftriaxone 10/6 x1 Cefazolin 10/7 >>   Dose adjustments this admission:   Microbiology results: 10/6 BCx: MSSA 10/6 MRSA PCR: neg 10/6 Resp panel: neg 10/7 blood x2-ngtd 10/7 blood x2- ngtd 10/8 knee fluid- ngtd  Thank you for involving pharmacy in this patient's care.  Harland German, PharmD Clinical Pharmacist **Pharmacist phone directory can now be found on [email or web address] ([email or web address]).  Listed under [facility].

## 2022-11-09 NOTE — Progress Notes (Signed)
[facility] PROGRESS NOTE    Progress Note  Michael Bender  WUX:[id number] DOB: 05/12/1945 DOA: 11/05/2022 PCP: Lula Olszewski, MD     Brief Narrative:   Michael Bender is an 77 y.o. male past medical history significant peripheral vascular disease, essential hypertension, history of orthostatic hypotension recently discharged from the hospital on 11/02/2022 due to multiple falls, traumatic rhabdomyolysis acute kidney injury acute urinary retention due to bladder outlet obstruction and BPH, was offered skilled nursing facility but declined went home with PT came in with recurrent syncopal episode associated with generalized weakness found to be bacteremic, source is unclear   Assessment/Plan:   Septic arthritis/MSSA bacteremia: ID was consulted.  Currently on IV cefazolin surveillance blood cultures on 11/07/2022 have been negative till date. MRI of the wrist and knee concerning for septic arthritis, Ortho was consulted he status post right knee arthrocentesis by Ortho,  Due to concern of septic arthritis orthopedic surgery recommended left wrist capsulotomy and washout on 11/09/2022. Antibiotics per ID.  He has remained afebrile. PT OT evaluated the patient will need skilled nursing facility, [facility] has been consulted. Relates his pain is controlled. Started on MiraLAX p.o. twice daily  Syncope due to orthostatic vitals: IV fluids were stopped probably infection contributing to it. PT OT has been  evaluating the patient, will need skilled nursing facility.  Elevated troponins: Likely demand ischemia twelve-lead EKG showed no evidence of ischemia.  Newly diagnosed A-fib with RVR: With a chads Vascor greater than 4 started on IV diltiazem now switched to oral beta-blockers. Not a candidate for blood thinners due to recurrent falls and orthostasis, can be evaluated as an outpatient.  Hypokalemia/hypomagnesemia: Repleted, try to potassium greater than 4 magnesium greater than  2.  Acute kidney injury: Suspect prerenal azotemia. He was continued on Flomax started on IV fluids on admission 1.8 after IV fluid hydration it has improved to baseline around 1.2.  Physical debility: PT OT is was consulted, will need to go to skilled nursing facility.   DVT prophylaxis: lovenox Family Communication:none Status is: Inpatient Remains inpatient appropriate because: Septic arthritis/MSSA bacteremia    Code Status:     Code Status Orders  (From admission, onward)           Start     Ordered   11/08/22 1420  Do not attempt resuscitation (DNR)- Limited -Do Not Intubate (DNI)  (Code Status)  Continuous       Question Answer Comment  If pulseless and not breathing No CPR or chest compressions.   In Pre-Arrest Conditions (Patient Is Breathing and Has A Pulse) Do not intubate. Provide all appropriate non-invasive medical interventions. Avoid ICU transfer unless indicated or required.   Consent: Discussion documented in EHR or advanced directives reviewed      11/08/22 1419           Code Status History     Date Active Date Inactive Code Status Order ID Comments User Context   11/05/2022 2258 11/08/2022 1419 Full Code [id number]  Darlin Drop, DO ED   10/28/2022 0324 11/02/2022 1701 Full Code [id number]  Dolly Rias, MD Inpatient   07/17/2019 2250 07/21/2019 1754 DNR [id number]  John Giovanni, MD Inpatient   07/17/2019 2133 07/17/2019 2250 Full Code [id number]  John Giovanni, MD ED         IV Access:   Peripheral IV   Procedures and diagnostic studies:   DG Finger Index Right  Result Date: 11/07/2022 CLINICAL DATA:  Osteomyelitis.  Pain.  EXAM: RIGHT INDEX FINGER 2+V COMPARISON:  None Available. FINDINGS: Arthropathy of the distal interphalangeal joint with joint space narrowing and spurring. There may be a small erosion involving the radial aspect of the joint space. No frank bony destruction. The distal phalanx is dorsally subluxed, no frank  dislocation. Joint space narrowing with subchondral cysts at the metacarpal phalangeal joint. Mild generalized soft tissue edema. No soft tissue gas or radiopaque foreign body. IMPRESSION: 1. Arthropathy of the distal interphalangeal joint with dorsal subluxation of the distal phalanx. There may be a small erosion involving the radial aspect of the joint space. This could represent infection in the appropriate clinical setting. No frank bony destruction. 2. Mild generalized soft tissue edema. Electronically Signed   By: Narda Rutherford M.D.   On: 11/07/2022 19:41   MR WRIST LEFT W WO CONTRAST  Result Date: 11/07/2022 CLINICAL DATA:  Bacteremia.  Swelling and tenderness of the wrist EXAM: MR OF THE LEFT WRIST WITHOUT AND WITH CONTRAST TECHNIQUE: Multiplanar multisequence MR imaging of the left wrist was performed both before and after the administration of intravenous contrast. CONTRAST:  6mL GADAVIST GADOBUTROL 1 MMOL/ML IV SOLN COMPARISON:  None Available. FINDINGS: Despite efforts by the technologist and patient, motion artifact is present on today's exam and could not be eliminated. This reduces exam sensitivity and specificity. Ligaments: Grossly intact appearing intrinsic interosseous ligaments. No obvious the dorsal or volar ligamentous insufficiency. Triangular fibrocartilage: Grossly intact although partially obscured by field heterogeneity. Tendons: Unremarkable Carpal tunnel/median nerve: Unremarkable Guyon's canal: No impinging lesion identified. Joint/cartilage: Radiocarpal and midcarpal joint effusions. Bones/carpal alignment: Small degenerative subacute S cortical cystic lesions along the lunate side of the scaphoid (image 9, series 8) and along the proximal pole of the capitate. Advanced degenerative arthropathy of the first carpal metacarpal articulation with prominent subcortical cyst formation and spurring. Mild degenerative arthropathy between the scaphoid and trapezium. Small effusion of the  distal radioulnar joint with associated degenerative arthropathy as on image 20 series 9. Other: Dorsal subcutaneous edema along the wrist and extending into the hand. No abscess observed. IMPRESSION: 1. Dorsal subcutaneous edema along the wrist and extending into the hand, without abscess. 2. Radiocarpal and midcarpal joint effusions. Effusion of the distal radioulnar joint. 3. Advanced degenerative arthropathy of the first carpometacarpal articulation. Mild degenerative arthropathy between the scaphoid and trapezium. Electronically Signed   By: Gaylyn Rong M.D.   On: 11/07/2022 19:02   MR KNEE RIGHT W WO CONTRAST  Result Date: 11/07/2022 CLINICAL DATA:  Bacteremia and knee tenderness EXAM: MRI OF THE RIGHT KNEE WITHOUT AND WITH CONTRAST TECHNIQUE: Multiplanar, multisequence MR imaging of the knee was performed before and after the administration of intravenous contrast. CONTRAST:  6mL GADAVIST GADOBUTROL 1 MMOL/ML IV SOLN COMPARISON:  None Available. FINDINGS: Despite efforts by the technologist and patient, motion artifact is present on today's exam and could not be eliminated. This reduces exam sensitivity and specificity. MENISCI Medial meniscus: Degenerative tearing of the posterior horn likely extending into the meniscal root. Mild grade 3 signal in the posterior horn extends to the inferior surface as on image 28 series 7. Lateral meniscus: Prominent expansion of the posterior horn extending towards the meniscal root suggesting prominent degeneration. LIGAMENTS Cruciates:  Unremarkable Collaterals:  Unremarkable CARTILAGE Patellofemoral: Moderate to severe chondral thinning with marginal spurring. Medial:  Mild chondral thinning. Lateral:  Mild chondral thinning. Joint: Moderate to large knee effusion. Only thin synovial enhancement. This is unlikely to be a septic joint given the thin nature of  the synovial enhancement. Thin medial plica noted. Popliteal Fossa:  Unremarkable Extensor Mechanism:  Possible focal central tendinopathy in the distal patellar tendon on image 13 series 7. Bones: 1.0 by 1.4 cm non-fragmented osteochondral lesion versus subcortical stress fracture anteriorly in the medial femoral condyle with surrounding marrow edema as on image 13 series 11 Other: No supplemental non-categorized findings. IMPRESSION: 1. Moderate to large knee effusion, but with only thin synovial enhancement. This is unlikely to be a septic joint given the thin nature of the synovial enhancement. 2. Non-fragmented osteochondral lesion versus subcortical stress fracture anteriorly in the medial femoral condyle with surrounding marrow edema. 3. Degenerative tearing of the posterior horn medial meniscus likely extending into the meniscal root. 4. Prominent expansion of the posterior horn lateral meniscus extending towards the meniscal root suggesting prominent degeneration. 5. Moderate to severe chondral thinning in the patellofemoral compartment with mild chondral thinning in the medial and lateral compartments. 6. Possible focal central tendinopathy in the distal patellar tendon. Electronically Signed   By: Gaylyn Rong M.D.   On: 11/07/2022 18:54     Medical Consultants:   None.   Subjective:    Michael Bender feels better, pain is controlled has not had a bowel movement.  Objective:    Vitals:   11/09/22 0800 11/09/22 0815 11/09/22 0831 11/09/22 0844  BP: 115/78 (!) 147/64 124/74   Pulse: 81 81 81 79  Resp: 17 19 12 14   Temp:   (!) 97.4 F (36.3 C) 98.1 F (36.7 C)  TempSrc:    Oral  SpO2: 100% 93% 92%   Weight:      Height:       SpO2: 92 %   Intake/Output Summary (Last 24 hours) at 11/09/2022 1306 Last data filed at 11/09/2022 0746 Gross per 24 hour  Intake 1557.59 ml  Output 16109 ml  Net -9447.41 ml   Filed Weights   11/05/22 1949  Weight: 61.2 kg    Exam: General exam: In no acute distress. Respiratory system: Good air movement and clear to  auscultation. Cardiovascular system: S1 & S2 heard, RRR. No JVD. Gastrointestinal system: Abdomen is nondistended, soft and nontender.  Extremities: Left wrist wrap Skin: No rashes, lesions or ulcers Psychiatry: Judgement and insight appear normal. Mood & affect appropriate.    Data Reviewed:    Labs: Basic Metabolic Panel: Recent Labs  Lab 11/05/22 2035 11/06/22 0903 11/07/22 6045 11/08/22 0732 11/08/22 0849 11/08/22 1726 11/09/22 0437  NA 134* 132* 133* 134*  --   --  135  K 2.6* 3.3* 3.0* 2.9*  --   --  4.0  CL 97* 101 99 95*  --   --  102  CO2 21* 21* 24 26  --   --  24  GLUCOSE 222* 295* 257* 321*  --  405* 137*  BUN 23 23 21 18   --   --  20  CREATININE 1.87* 1.70* 1.60* 1.32*  --   --  1.27*  CALCIUM 8.0* 7.7* 7.9* 8.2*  --   --  8.5*  MG 1.6*  --  1.8  --  1.8  --  2.0   GFR Estimated Creatinine Clearance: 42.2 mL/min (A) (by C-G formula based on SCr of 1.27 mg/dL (H)). Liver Function Tests: Recent Labs  Lab 11/05/22 2035  AST 28  ALT 22  ALKPHOS 116  BILITOT 0.4  PROT 5.7*  ALBUMIN 2.2*   No results for input(s): "LIPASE", "AMYLASE" in the last 168 hours. No results  for input(s): "AMMONIA" in the last 168 hours. Coagulation profile No results for input(s): "INR", "PROTIME" in the last 168 hours. COVID-19 Labs  No results for input(s): "DDIMER", "FERRITIN", "LDH", "CRP" in the last 72 hours.  Lab Results  Component Value Date   SARSCOV2NAA NEGATIVE 11/06/2022   SARSCOV2NAA RESULT: NEGATIVE 09/12/2019   SARSCOV2NAA NEGATIVE 07/17/2019   SARSCOV2NAA RESULT:  NEGATIVE 03/06/2019    CBC: Recent Labs  Lab 11/05/22 2035 11/06/22 0903 11/07/22 0608 11/09/22 0437  WBC 9.3 9.0 7.0 9.7  NEUTROABS 8.6*  --   --   --   HGB 9.3* 9.0* 8.6* 9.5*  HCT 28.4* 26.8* 25.4* 27.9*  MCV 92.5 92.4 90.4 92.1  PLT 146* 134* 125* 161   Cardiac Enzymes: Recent Labs  Lab 11/05/22 2035  CKTOTAL 108   BNP (last 3 results) No results for input(s): "PROBNP" in  the last 8760 hours. CBG: Recent Labs  Lab 11/08/22 1701 11/08/22 1747 11/08/22 2041 11/09/22 0607 11/09/22 0844  GLUCAP 430* 331* 211* 162* 184*   D-Dimer: No results for input(s): "DDIMER" in the last 72 hours. Hgb A1c: Recent Labs    11/08/22 0732  HGBA1C 6.5*   Lipid Profile: No results for input(s): "CHOL", "HDL", "LDLCALC", "TRIG", "CHOLHDL", "LDLDIRECT" in the last 72 hours. Thyroid function studies: No results for input(s): "TSH", "T4TOTAL", "T3FREE", "THYROIDAB" in the last 72 hours.  Invalid input(s): "FREET3" Anemia work up: No results for input(s): "VITAMINB12", "FOLATE", "FERRITIN", "TIBC", "IRON", "RETICCTPCT" in the last 72 hours. Sepsis Labs: Recent Labs  Lab 11/05/22 2035 11/06/22 0903 11/07/22 0608 11/09/22 0437  WBC 9.3 9.0 7.0 9.7   Microbiology Recent Results (from the past 240 hour(s))  Culture, blood (Routine X 2) w Reflex to ID Panel     Status: Abnormal   Collection Time: 11/06/22  1:13 AM   Specimen: BLOOD RIGHT ARM  Result Value Ref Range Status   Specimen Description BLOOD RIGHT ARM  Final   Special Requests   Final    BOTTLES DRAWN AEROBIC AND ANAEROBIC Blood Culture results may not be optimal due to an excessive volume of blood received in culture bottles   Culture  Setup Time   Final    GRAM POSITIVE COCCI IN BOTH AEROBIC AND ANAEROBIC BOTTLES Organism ID to follow CRITICAL RESULT CALLED TO, READ BACK BY AND VERIFIED WITH: PHARMD JESSICA MILLEN [id number] BY Berline Chough, [place] Performed at [facility], [address] [place], [place] [address]    Culture STAPHYLOCOCCUS AUREUS (A)  Final   Report Status 11/08/2022 FINAL  Final   Organism ID, Bacteria STAPHYLOCOCCUS AUREUS  Final      Susceptibility   Staphylococcus aureus - MIC*    CIPROFLOXACIN <=0.5 SENSITIVE Sensitive     ERYTHROMYCIN <=0.25 SENSITIVE Sensitive     GENTAMICIN <=0.5 SENSITIVE Sensitive     OXACILLIN 0.5 SENSITIVE Sensitive     TETRACYCLINE <=1 SENSITIVE  Sensitive     VANCOMYCIN <=0.5 SENSITIVE Sensitive     TRIMETH/SULFA <=10 SENSITIVE Sensitive     CLINDAMYCIN <=0.25 SENSITIVE Sensitive     RIFAMPIN <=0.5 SENSITIVE Sensitive     Inducible Clindamycin NEGATIVE Sensitive     LINEZOLID 1 SENSITIVE Sensitive     * STAPHYLOCOCCUS AUREUS  Blood Culture ID Panel (Reflexed)     Status: Abnormal   Collection Time: 11/06/22  1:13 AM  Result Value Ref Range Status   Enterococcus faecalis NOT DETECTED NOT DETECTED Final   Enterococcus Faecium NOT DETECTED NOT  DETECTED Final   Listeria monocytogenes NOT DETECTED NOT DETECTED Final   Staphylococcus species DETECTED (A) NOT DETECTED Final    Comment: CRITICAL RESULT CALLED TO, READ BACK BY AND VERIFIED WITH: PHARMD JESSICA MILLEN [id number] 1429 BY J RAZZAK, MT    Staphylococcus aureus (BCID) DETECTED (A) NOT DETECTED Final    Comment: CRITICAL RESULT CALLED TO, READ BACK BY AND VERIFIED WITH: PHARMD JESSICA MILLEN [id number] 1429 BY J RAZZAK, MT    Staphylococcus epidermidis NOT DETECTED NOT DETECTED Final   Staphylococcus lugdunensis NOT DETECTED NOT DETECTED Final   Streptococcus species NOT DETECTED NOT DETECTED Final   Streptococcus agalactiae NOT DETECTED NOT DETECTED Final   Streptococcus pneumoniae NOT DETECTED NOT DETECTED Final   Streptococcus pyogenes NOT DETECTED NOT DETECTED Final   A.calcoaceticus-baumannii NOT DETECTED NOT DETECTED Final   Bacteroides fragilis NOT DETECTED NOT DETECTED Final   Enterobacterales NOT DETECTED NOT DETECTED Final   Enterobacter cloacae complex NOT DETECTED NOT DETECTED Final   Escherichia coli NOT DETECTED NOT DETECTED Final   Klebsiella aerogenes NOT DETECTED NOT DETECTED Final   Klebsiella oxytoca NOT DETECTED NOT DETECTED Final   Klebsiella pneumoniae NOT DETECTED NOT DETECTED Final   Proteus species NOT DETECTED NOT DETECTED Final   Salmonella species NOT DETECTED NOT DETECTED Final   Serratia marcescens NOT DETECTED NOT DETECTED Final    Haemophilus influenzae NOT DETECTED NOT DETECTED Final   Neisseria meningitidis NOT DETECTED NOT DETECTED Final   Pseudomonas aeruginosa NOT DETECTED NOT DETECTED Final   Stenotrophomonas maltophilia NOT DETECTED NOT DETECTED Final   Candida albicans NOT DETECTED NOT DETECTED Final   Candida auris NOT DETECTED NOT DETECTED Final   Candida glabrata NOT DETECTED NOT DETECTED Final   Candida krusei NOT DETECTED NOT DETECTED Final   Candida parapsilosis NOT DETECTED NOT DETECTED Final   Candida tropicalis NOT DETECTED NOT DETECTED Final   Cryptococcus neoformans/gattii NOT DETECTED NOT DETECTED Final   Meth resistant mecA/C and MREJ NOT DETECTED NOT DETECTED Final    Comment: Performed at [facility], [address], [place], [place] [address]  Culture, blood (Routine X 2) w Reflex to ID Panel     Status: Abnormal   Collection Time: 11/06/22  1:14 AM   Specimen: BLOOD RIGHT ARM  Result Value Ref Range Status   Specimen Description BLOOD RIGHT ARM  Final   Special Requests   Final    BOTTLES DRAWN AEROBIC AND ANAEROBIC Blood Culture results may not be optimal due to an excessive volume of blood received in culture bottles   Culture  Setup Time   Final    GRAM POSITIVE COCCI IN BOTH AEROBIC AND ANAEROBIC BOTTLES    Culture (A)  Final    STAPHYLOCOCCUS AUREUS SUSCEPTIBILITIES PERFORMED ON PREVIOUS CULTURE WITHIN THE LAST 5 DAYS. Performed at [facility], [address], [place], [place] [address]    Report Status 11/08/2022 FINAL  Final  MRSA Next Gen by PCR, Nasal     Status: None   Collection Time: 11/06/22  1:29 AM   Specimen: Nasal Mucosa; Nasal Swab  Result Value Ref Range Status   MRSA by PCR Next Gen NOT DETECTED NOT DETECTED Final    Comment: (NOTE) The GeneXpert MRSA Assay (FDA approved for NASAL specimens only), is one component of a comprehensive MRSA colonization surveillance program. It is not intended to diagnose MRSA infection nor to guide or monitor  treatment for MRSA infections. Test performance is not FDA approved in patients  less than 68 years old. Performed at [facility], [address] [place], [place] [address]   SARS Coronavirus 2 by RT PCR (hospital order, performed in [facility] hospital lab) *cepheid single result test* Anterior Nasal Swab     Status: None   Collection Time: 11/06/22  7:33 AM   Specimen: Anterior Nasal Swab  Result Value Ref Range Status   SARS Coronavirus 2 by RT PCR NEGATIVE NEGATIVE Final    Comment: Performed at [facility], [address], [place], [place] [address]  Respiratory (~20 pathogens) panel by PCR     Status: None   Collection Time: 11/06/22  7:34 AM   Specimen: Nasopharyngeal Swab; Respiratory  Result Value Ref Range Status   Adenovirus NOT DETECTED NOT DETECTED Final   Coronavirus 229E NOT DETECTED NOT DETECTED Final    Comment: (NOTE) The Coronavirus on the Respiratory Panel, DOES NOT test for the novel  Coronavirus (2019 nCoV)    Coronavirus HKU1 NOT DETECTED NOT DETECTED Final   Coronavirus NL63 NOT DETECTED NOT DETECTED Final   Coronavirus OC43 NOT DETECTED NOT DETECTED Final   Metapneumovirus NOT DETECTED NOT DETECTED Final   Rhinovirus / Enterovirus NOT DETECTED NOT DETECTED Final   Influenza A NOT DETECTED NOT DETECTED Final   Influenza B NOT DETECTED NOT DETECTED Final   Parainfluenza Virus 1 NOT DETECTED NOT DETECTED Final   Parainfluenza Virus 2 NOT DETECTED NOT DETECTED Final   Parainfluenza Virus 3 NOT DETECTED NOT DETECTED Final   Parainfluenza Virus 4 NOT DETECTED NOT DETECTED Final   Respiratory Syncytial Virus NOT DETECTED NOT DETECTED Final   Bordetella pertussis NOT DETECTED NOT DETECTED Final   Bordetella Parapertussis NOT DETECTED NOT DETECTED Final   Chlamydophila pneumoniae NOT DETECTED NOT DETECTED Final   Mycoplasma pneumoniae NOT DETECTED NOT DETECTED Final    Comment: Performed at [facility], [address] [place], [place]  [address]  Culture, blood (Routine X 2) w Reflex to ID Panel     Status: None (Preliminary result)   Collection Time: 11/07/22  6:08 AM   Specimen: BLOOD RIGHT ARM  Result Value Ref Range Status   Specimen Description BLOOD RIGHT ARM  Final   Special Requests   Final    BOTTLES DRAWN AEROBIC AND ANAEROBIC Blood Culture adequate volume   Culture   Final    NO GROWTH 2 DAYS Performed at [facility], [address], [place], [place] [address]    Report Status PENDING  Incomplete  Culture, blood (Routine X 2) w Reflex to ID Panel     Status: None (Preliminary result)   Collection Time: 11/07/22  6:08 AM   Specimen: BLOOD RIGHT HAND  Result Value Ref Range Status   Specimen Description BLOOD RIGHT HAND  Final   Special Requests   Final    BOTTLES DRAWN AEROBIC AND ANAEROBIC Blood Culture results may not be optimal due to an excessive volume of blood received in culture bottles   Culture   Final    NO GROWTH 2 DAYS Performed at [facility], [address], [place], [place] [address]    Report Status PENDING  Incomplete  Culture, blood (Routine X 2) w Reflex to ID Panel     Status: None (Preliminary result)   Collection Time: 11/07/22  9:19 PM   Specimen: BLOOD RIGHT HAND  Result Value Ref Range Status   Specimen Description BLOOD RIGHT HAND  Final  Special Requests   Final    BOTTLES DRAWN AEROBIC AND ANAEROBIC Blood Culture adequate volume   Culture   Final    NO GROWTH 2 DAYS Performed at [facility], [address] [place], [place] [address]    Report Status PENDING  Incomplete  Culture, blood (Routine X 2) w Reflex to ID Panel     Status: None (Preliminary result)   Collection Time: 11/07/22  9:19 PM   Specimen: BLOOD RIGHT HAND  Result Value Ref Range Status   Specimen Description BLOOD RIGHT HAND  Final   Special Requests   Final    BOTTLES DRAWN AEROBIC ONLY Blood Culture results may not be optimal due to an inadequate volume of blood received in culture  bottles   Culture   Final    NO GROWTH 2 DAYS Performed at [facility], [address], [place], [place] [address]    Report Status PENDING  Incomplete  Body fluid culture w Gram Stain     Status: None (Preliminary result)   Collection Time: 11/08/22 12:05 PM   Specimen: Body Fluid  Result Value Ref Range Status   Specimen Description FLUID SYNOVIAL RIGHT KNEE  Final   Special Requests NONE  Final   Gram Stain   Final    RARE WBC PRESENT, PREDOMINANTLY PMN NO ORGANISMS SEEN    Culture   Final    NO GROWTH < 24 HOURS Performed at [facility], [address] [place], [place] [address]    Report Status PENDING  Incomplete  Gram stain     Status: None   Collection Time: 11/08/22  6:50 PM   Specimen: Synovium; Body Fluid  Result Value Ref Range Status   Specimen Description SYNOVIAL  Final   Special Requests WRIST  Final   Gram Stain   Final    ABUNDANT WBC PRESENT, PREDOMINANTLY PMN NO ORGANISMS SEEN Performed at [facility], [address] [place], [place] [address]    Report Status 11/08/2022 FINAL  Final  Body fluid culture w Gram Stain     Status: None (Preliminary result)   Collection Time: 11/08/22  7:17 PM   Specimen: Synovium  Result Value Ref Range Status   Specimen Description SYNOVIAL  Final   Special Requests WRIST  Final   Gram Stain   Final    ABUNDANT WBC PRESENT, PREDOMINANTLY PMN NO ORGANISMS SEEN    Culture   Final    NO GROWTH < 12 HOURS Performed at [facility], [address] [place], [place] [address]    Report Status PENDING  Incomplete  Surgical pcr screen     Status: Abnormal   Collection Time: 11/09/22  4:36 AM   Specimen: Nasal Mucosa; Nasal Swab  Result Value Ref Range Status   MRSA, PCR NEGATIVE NEGATIVE Final   Staphylococcus aureus POSITIVE (A) NEGATIVE Final    Comment: (NOTE) The Xpert SA Assay (FDA approved for NASAL specimens in patients 19 years of age and older), is one component of a  comprehensive surveillance program. It is not intended to diagnose infection nor to guide or monitor treatment. Performed at [facility], [address] [place], [place] [address]      Medications:    amitriptyline  25 mg Oral QHS   enoxaparin (LOVENOX) injection  30 mg Subcutaneous Daily   feeding supplement  237 mL Oral BID BM   insulin aspart  0-15 Units Subcutaneous  TID WC   insulin aspart  0-5 Units Subcutaneous QHS   insulin glargine-yfgn  10 Units Subcutaneous Daily   metoprolol tartrate  25 mg Oral BID   tamsulosin  0.4 mg Oral QPC supper   Continuous Infusions:   ceFAZolin (ANCEF) IV 2 g (11/09/22 0552)   diltiazem (CARDIZEM) infusion 5 mg/hr (11/08/22 2000)      LOS: 4 days   Marinda Elk  [facility]  11/09/2022, 1:06 PM

## 2022-11-09 NOTE — Plan of Care (Signed)
Patient has been stable since surgery. Will continue to monitor.

## 2022-11-09 NOTE — Inpatient Diabetes Management (Signed)
Inpatient Diabetes Program Recommendations  AACE/ADA: New Consensus Statement on Inpatient Glycemic Control (2015)  Target Ranges:  Prepandial:   less than 140 mg/dL      Peak postprandial:   less than 180 mg/dL (1-2 hours)      Critically ill patients:  140 - 180 mg/dL   Lab Results  Component Value Date   GLUCAP 184 (H) 11/09/2022   HGBA1C 6.5 (H) 11/08/2022    Review of Glycemic Control  Latest Reference Range & Units 11/08/22 14:11 11/08/22 17:01 11/08/22 17:47 11/08/22 20:41 11/09/22 06:07 11/09/22 08:44  Glucose-Capillary 70 - 99 mg/dL 409 (H) 811 (H) 914 (H) 211 (H) 162 (H) 184 (H)   Diabetes history: DM 2 Outpatient Diabetes medications:  Current orders for Inpatient glycemic control:  Semglee 10 units Novolog 0-15 units tid + hs  Note: Pt received Decadron 4 mg in procedure this am, anticipate trends to increase today  Inpatient Diabetes Program Recommendations:    -   Could consider Novolog 4 units tid meal coverage if eating >50% of meals  Thanks,  Christena Deem RN, MSN, BC-ADM Inpatient Diabetes Coordinator Team Pager [phone] (8a-5p)

## 2022-11-09 NOTE — Progress Notes (Signed)
Patient sugar was 416 on finger stick. I notified Lambert Keto MD of patient condition and followed protocol. Patient lab draw was 432. I gave 15 units and 10 units of Semiglee per MD order, SEE MAR.  Patient recheck at 1736 was 416. MD notified and awaiting new orders.

## 2022-11-09 NOTE — Anesthesia Procedure Notes (Signed)
Procedure Name: Intubation Date/Time: 11/09/2022 6:41 AM  Performed by: Tressia Miners, CRNAPre-anesthesia Checklist: Patient identified, Emergency Drugs available, Suction available, Patient being monitored and Timeout performed Patient Re-evaluated:Patient Re-evaluated prior to induction Oxygen Delivery Method: Circle system utilized Preoxygenation: Pre-oxygenation with 100% oxygen Induction Type: IV induction, Rapid sequence and Cricoid Pressure applied Laryngoscope Size: Mac and 4 Grade View: Grade I Tube type: Oral Tube size: 7.5 mm Number of attempts: 1 Airway Equipment and Method: Stylet Placement Confirmation: ETT inserted through vocal cords under direct vision, positive ETCO2 and breath sounds checked- equal and bilateral Secured at: 23 cm Tube secured with: Tape Dental Injury: Teeth and Oropharynx as per pre-operative assessment  Comments: Smooth IV Induction. Eyes taped. RSI Performed. DL x 1 with grade 1 view. Atraumatically placed, teeth and lip remain intact as pre-op. Secured with tape. Bilateral breath sounds +/=, EtCO2 +, Adequate TV, VSS.

## 2022-11-09 NOTE — TOC Progression Note (Signed)
Transition of Care [facility]) - Progression Note    Patient Details  Name: Michael Bender MRN: [id number] Date of Birth: May 31, 1945  [facility]) CM/SW Contact  Delilah Shan, LCSWA Phone Number: 11/09/2022, 12:58 PM  Clinical Narrative:     Patient has SNF bed at [facility]. CSW following to start insurance authorization closer to patient being medically ready for dc. CSW will continue to follow and assist with patients dc planning needs.  Expected Discharge Plan: Skilled Nursing Facility Barriers to Discharge: Continued Medical Work up  Expected Discharge Plan and Services In-house Referral: Clinical Social Work     Living arrangements for the past 2 months: Single Family Home                                       Social Determinants of Health (SDOH) Interventions SDOH Screenings   Food Insecurity: No Food Insecurity (11/06/2022)  Housing: Low Risk  (11/06/2022)  Transportation Needs: No Transportation Needs (11/06/2022)  Utilities: Not At Risk (11/06/2022)  Alcohol Screen: Low Risk  (07/14/2021)  Depression (PHQ2-9): Low Risk  (10/13/2022)  Financial Resource Strain: Low Risk  (10/13/2022)  Physical Activity: Insufficiently Active (10/13/2022)  Social Connections: Socially Isolated (10/13/2022)  Stress: No Stress Concern Present (10/13/2022)  Tobacco Use: High Risk (11/09/2022)  Health Literacy: Inadequate Health Literacy (10/13/2022)    Readmission Risk Interventions     No data to display

## 2022-11-09 NOTE — Op Note (Signed)
NAME: Michael Bender MEDICAL RECORD NO: [id number] DATE OF BIRTH: Mar 13, 1945 FACILITY: [facility] LOCATION: [facility] OR PHYSICIAN: Samuella Cota, MD   OPERATIVE REPORT   DATE OF PROCEDURE: 11/09/22    PREOPERATIVE DIAGNOSIS: Left wrist septic arthritis   POSTOPERATIVE DIAGNOSIS: Left wrist septic arthritis   PROCEDURE: Left wrist arthrotomy with irrigation and debridement   SURGEON:  Samuella Cota, M.D.   ASSISTANT: None   ANESTHESIA:  General   INTRAVENOUS FLUIDS:  Per anesthesia flow sheet.   ESTIMATED BLOOD LOSS:  Minimal.   COMPLICATIONS:  None.   SPECIMENS: Cultures taken from the wrist   TOURNIQUET TIME:    Total Tourniquet Time Documented: area (laterality) - 27 minutes Total: area (laterality) - 27 minutes    DISPOSITION:  Stable to PACU.   INDICATIONS: This is a 77 year old male who was seen by myself in consultation at the request of infectious disease for ongoing pain and swelling in his left wrist in the setting of prior bacteremia.  Had undergone MRI while inpatient which did show effusion.  Bedside aspiration was performed of the left wrist for diagnostic purposes.  Sample taken from the left wrist was deemed to be too viscous for cell count, however under microbiology, was yielded to show abundant white blood cells greater than 25 per high-power field.  The aspirate was noted to be cloudy and thick in nature, concerning for infection.  Based upon his workup, symptoms and laboratory results, patient had high concern for left wrist septic arthritis.  Decision was made to take patient emergently to the operating room for left wrist capsulotomy and irrigation and debridement.  Risks and benefits of surgery were discussed including the risks of infection, bleeding, scarring, stiffness, nerve injury, vascular injury, tendon injury, need for subsequent operation, , need for repeat irrigation and debridement.  We discussed the destructive nature of septic arthritis and  the possibility for ongoing arthritic changes.  His preoperative workup did show arthritic changes which had been present prior at the left radiocarpal and midcarpal articulations.  He voiced understanding of these risks and elected to proceed.  Consent was also obtained from his son to proceed with the operation.  OPERATIVE COURSE: Patient was seen and identified in the preoperative area and marked appropriately.  Surgical consent had been signed.  Patient was on scheduled IV antibiotics preoperatively.  He was transferred to the operating room and placed in supine position with the Left upper extremity on an arm board.  General anesthesia was induced by the anesthesiologist.  Left upper extremity was prepped and draped in normal sterile orthopedic fashion.  A surgical pause was performed between the surgeons, anesthesia, and operating room staff and all were in agreement as to the patient, procedure, and site of procedure.  Tourniquet was placed and padded appropriately to the left upper arm.  The arm was elevated and the tourniquet was inflated to 250 mmHg.  Longitudinal incision was designed over the left wrist just ulnar to the Lister's tubercle.  Blunt dissection was performed, skin flaps were elevated to expose the underlying extensor retinaculum.  Extensor pollicis longus was identified, retinaculum was released over the EPL to allow for appropriate transposition.  Retinacular flaps were elevated, second and fourth compartment extensor tendons were retracted radially and ulnarly respectively to allow for exposure of the wrist capsule.  A T-shaped incision was made at the wrist capsule using a 15 blade.  There was noted to be purulent fluid within the left wrist.  Cultures were taken.  Copious irrigation was then performed of the left wrist radiocarpal joint and midcarpal articulations.  3 L of saline was utilized using cystoscopy tubing.  After appropriate irrigation, gentle sharp debridement of some  devitalized tissue around the wrist was performed, there was noted to be no remaining purulent fluid within the left wrist.  Radiocarpal and mid carpal articulations did have some degenerative changes noted to the cartilage, however his preoperative workup did show chronic arthritic changes which was consistent with this.  Once were satisfied with our irrigation and debridement, capsular closure was performed utilizing 3-0 Vicryl in figure-of-eight fashion.  Tourniquet was subsequently deflated and bipolar electrocautery was utilized for hemostasis.  Retinaculum was closed leaving the EPL transposed utilizing 3-0 Vicryl in figure-of-eight fashion.  Skin surface was then closed utilizing 4-0 nylon in horizontal mattress fashion.  Sterile dressings were applied utilizing Xeroform, gauze, Kerlix and web roll.  Dorsal plaster wrist splint was then applied followed by application of a loosefitting Ace wrap.  Fingertips were pink with brisk capillary refill after deflation of tourniquet.  The operative drapes were broken down.  The patient was awoken from anesthesia safely and taken to PACU in stable condition.  He will remain admitted to the inpatient unit, cultures will be followed up to allow for appropriate postoperative treatment.   Samuella Cota, MD Electronically signed, 11/09/22

## 2022-11-09 NOTE — Anesthesia Preprocedure Evaluation (Addendum)
Anesthesia Evaluation  Patient identified by MRN, date of birth, ID band Patient confused    Reviewed: Unable to perform ROS - Chart review onlyPreop documentation limited or incomplete due to emergent nature of procedure.  Airway Mallampati: Unable to assess  TM Distance: >3 FB Neck ROM: Full    Dental  (+) Edentulous Upper, Edentulous Lower   Pulmonary COPD, Current Smoker and Patient abstained from smoking.    + decreased breath sounds      Cardiovascular hypertension, Pt. on medications + Peripheral Vascular Disease   Rhythm:Regular  1. Left ventricular ejection fraction, by estimation, is 60 to 65%. The  left ventricle has normal function. The left ventricle has no regional  wall motion abnormalities. Left ventricular diastolic parameters are  consistent with Grade II diastolic  dysfunction (pseudonormalization).   2. Right ventricular systolic function is normal. The right ventricular  size is normal. There is normal pulmonary artery systolic pressure.   3. The mitral valve is normal in structure. No evidence of mitral valve  regurgitation. No evidence of mitral stenosis.   4. The aortic valve is tricuspid. Aortic valve regurgitation is not  visualized. No aortic stenosis is present.   5. The inferior vena cava is normal in size with greater than 50%  respiratory variability, suggesting right atrial pressure of 3 mmHg.     Neuro/Psych    GI/Hepatic PUD,GERD  Medicated,,  Endo/Other  diabetes  Lab Results      Component                Value               Date                      HGBA1C                   6.5 (H)             11/08/2022             Renal/GU Renal InsufficiencyRenal diseaseLab Results      Component                Value               Date                      NA                       135                 11/09/2022                K                        4.0                 11/09/2022                CO2                       24                  11/09/2022                GLUCOSE  137 (H)             11/09/2022                BUN                      20                  11/09/2022                CREATININE               1.27 (H)            11/09/2022                CALCIUM                  8.5 (L)             11/09/2022                GFR                      67.73               05/03/2022                GFRNONAA                 58 (L)              11/09/2022                Musculoskeletal  (+) Arthritis ,  Possible left wrist septic arthritis   Abdominal   Peds  Hematology  (+) Blood dyscrasia, anemia Lab Results      Component                Value               Date                      WBC                      9.7                 11/09/2022                HGB                      9.5 (L)             11/09/2022                HCT                      27.9 (L)            11/09/2022                MCV                      92.1                11/09/2022                PLT                      161  11/09/2022              Anesthesia Other Findings   Reproductive/Obstetrics                             Anesthesia Physical Anesthesia Plan  ASA: 3 and emergent  Anesthesia Plan: General   Post-op Pain Management:    Induction: Intravenous and Rapid sequence  PONV Risk Score and Plan: 2 and Ondansetron and Dexamethasone  Airway Management Planned: Oral ETT  Additional Equipment: None  Intra-op Plan:   Post-operative Plan: Extubation in OR  Informed Consent:      Consent reviewed with POA  Plan Discussed with: CRNA  Anesthesia Plan Comments:        Anesthesia Quick Evaluation

## 2022-11-09 NOTE — Progress Notes (Signed)
     ORTHO PROGRESS NOTE:  Ongoing pain at the left wrist.  Aspiration workup unable to yield cell count secondary to high viscosity of fluid, discussed with micro department, greater than 25 white blood cells per high-powered field indicative of infection.  Objective:   VITALS:   Vitals:   11/08/22 1140 11/08/22 1900 11/08/22 2300 11/09/22 0357  BP: 134/83 (!) 132/57 (!) 140/73 (!) 141/69  Pulse: 87 82 88 81  Resp: 16 16 19 20   Temp: 98.4 F (36.9 C) 98.6 F (37 C) 98.3 F (36.8 C) 98.6 F (37 C)  TempSrc: Oral Oral Oral Oral  SpO2: 92% 98% 96% 96%  Weight:      Height:        Left wrist: Ongoing pain at left wrist, associated warmth, pain with range of motion, particularly at extremes     Lab Results  Component Value Date   WBC 9.7 11/09/2022   HGB 9.5 (L) 11/09/2022   HCT 27.9 (L) 11/09/2022   MCV 92.1 11/09/2022   PLT 161 11/09/2022     Assessment/Plan: 77 year old male rule out left wrist septic arthritis  Extensive discussion was had with the patient regarding his ongoing left wrist pain.  Based upon his workup, there is high level of concern for ongoing septic arthritis.  Lab was unable to perform synovial cell count due to high viscosity of the fluid, however under micro it was shown to have greater than 25 white blood cells per high-powered field indicative of infection.  Given his ongoing symptoms, appearance of the aspirate and lab workup, patient is indicated for left wrist capsulotomy and washout to be performed emergently.     Samuella Cota, MD [place], [facility] 11/09/2022, 5:31 AM

## 2022-11-09 NOTE — Transfer of Care (Signed)
Immediate Anesthesia Transfer of Care Note  Patient: RIDWAN BONDY  Procedure(s) Performed: IRRIGATION AND DEBRIDEMENT LEFT WRIST (Left)  Patient Location: PACU  Anesthesia Type:General  Level of Consciousness: awake, alert , and oriented  Airway & Oxygen Therapy: Patient Spontanous Breathing and Patient connected to face mask oxygen  Post-op Assessment: Report given to RN and Post -op Vital signs reviewed and stable  Post vital signs: Reviewed and stable  Last Vitals:  Vitals Value Taken Time  BP 115/78 11/09/22 0800  Temp    Pulse 80 11/09/22 0803  Resp 20 11/09/22 0803  SpO2 97 % 11/09/22 0803  Vitals shown include unfiled device data.  Last Pain:  Vitals:   11/09/22 0357  TempSrc: Oral  PainSc:       Patients Stated Pain Goal: 4 (11/08/22 2033)  Complications: There were no known notable events for this encounter.

## 2022-11-10 ENCOUNTER — Inpatient Hospital Stay: Payer: Medicare HMO | Admitting: Internal Medicine

## 2022-11-10 ENCOUNTER — Encounter (HOSPITAL_COMMUNITY): Payer: Self-pay | Admitting: Orthopedic Surgery

## 2022-11-10 DIAGNOSIS — M25462 Effusion, left knee: Secondary | ICD-10-CM | POA: Diagnosis not present

## 2022-11-10 DIAGNOSIS — M4642 Discitis, unspecified, cervical region: Secondary | ICD-10-CM

## 2022-11-10 DIAGNOSIS — T847XXA Infection and inflammatory reaction due to other internal orthopedic prosthetic devices, implants and grafts, initial encounter: Secondary | ICD-10-CM

## 2022-11-10 DIAGNOSIS — I4891 Unspecified atrial fibrillation: Secondary | ICD-10-CM | POA: Diagnosis not present

## 2022-11-10 DIAGNOSIS — B9561 Methicillin susceptible Staphylococcus aureus infection as the cause of diseases classified elsewhere: Secondary | ICD-10-CM | POA: Diagnosis not present

## 2022-11-10 DIAGNOSIS — R7881 Bacteremia: Secondary | ICD-10-CM | POA: Diagnosis not present

## 2022-11-10 DIAGNOSIS — S60421A Blister (nonthermal) of left index finger, initial encounter: Secondary | ICD-10-CM | POA: Diagnosis not present

## 2022-11-10 DIAGNOSIS — M00032 Staphylococcal arthritis, left wrist: Secondary | ICD-10-CM | POA: Diagnosis not present

## 2022-11-10 DIAGNOSIS — R55 Syncope and collapse: Secondary | ICD-10-CM | POA: Diagnosis not present

## 2022-11-10 DIAGNOSIS — M25432 Effusion, left wrist: Secondary | ICD-10-CM | POA: Diagnosis not present

## 2022-11-10 HISTORY — DX: Discitis, unspecified, cervical region: M46.42

## 2022-11-10 HISTORY — DX: Infection and inflammatory reaction due to other internal orthopedic prosthetic devices, implants and grafts, initial encounter: T84.7XXA

## 2022-11-10 LAB — GLUCOSE, CAPILLARY
Glucose-Capillary: 131 mg/dL — ABNORMAL HIGH (ref 70–99)
Glucose-Capillary: 68 mg/dL — ABNORMAL LOW (ref 70–99)
Glucose-Capillary: 139 mg/dL — ABNORMAL HIGH (ref 70–99)
Glucose-Capillary: 286 mg/dL — ABNORMAL HIGH (ref 70–99)
Glucose-Capillary: 183 mg/dL — ABNORMAL HIGH (ref 70–99)

## 2022-11-10 MED ORDER — CHLORHEXIDINE GLUCONATE CLOTH 2 % EX PADS
6.0000 | MEDICATED_PAD | Freq: Every day | CUTANEOUS | Status: AC
  Administered 2022-11-10 – 2022-11-14 (×5): 6 via TOPICAL

## 2022-11-10 MED ORDER — INSULIN GLARGINE-YFGN 100 UNIT/ML ~~LOC~~ SOLN
5.0000 [IU] | Freq: Two times a day (BID) | SUBCUTANEOUS | Status: DC
  Administered 2022-11-10 – 2022-11-14 (×8): 5 [IU] via SUBCUTANEOUS
  Filled 2022-11-10 (×9): qty 0.05

## 2022-11-10 MED ORDER — MUPIROCIN 2 % EX OINT
1.0000 | TOPICAL_OINTMENT | Freq: Two times a day (BID) | CUTANEOUS | Status: DC
  Administered 2022-11-10 – 2022-11-14 (×9): 1 via NASAL
  Filled 2022-11-10 (×2): qty 22

## 2022-11-10 MED ORDER — QUETIAPINE FUMARATE 25 MG PO TABS
25.0000 mg | ORAL_TABLET | Freq: Every evening | ORAL | Status: DC | PRN
  Administered 2022-11-13: 25 mg via ORAL
  Filled 2022-11-10: qty 1

## 2022-11-10 NOTE — Plan of Care (Signed)
  Problem: Health Behavior/Discharge Planning: Goal: Ability to manage health-related needs will improve Outcome: Progressing   Problem: Clinical Measurements: Goal: Ability to maintain clinical measurements within normal limits will improve Outcome: Progressing Goal: Respiratory complications will improve Outcome: Progressing Goal: Cardiovascular complication will be avoided Outcome: Progressing   Problem: Activity: Goal: Risk for activity intolerance will decrease Outcome: Progressing   Problem: Pain Managment: Goal: General experience of comfort will improve Outcome: Progressing   Problem: Safety: Goal: Ability to remain free from injury will improve Outcome: Progressing   Problem: Skin Integrity: Goal: Risk for impaired skin integrity will decrease Outcome: Progressing   Problem: Metabolic: Goal: Ability to maintain appropriate glucose levels will improve Outcome: Progressing   Problem: Nutritional: Goal: Maintenance of adequate nutrition will improve Outcome: Progressing

## 2022-11-10 NOTE — TOC Progression Note (Addendum)
Transition of Care [facility]) - Progression Note    Patient Details  Name: Michael Bender MRN: [id number] Date of Birth: 1945/03/08  [facility]) CM/SW Contact  Delilah Shan, LCSWA Phone Number: 11/10/2022, 2:02 PM  Clinical Narrative:     Patient has SNF bed at [facility] when medically ready. Patients insurance authorization has been approved  Total Cert#[id number] 10/14 -10/20. Auth approval is good through 10/21  if medically ready as long as they admit to facility before midnight.CSW will continue to follow.  Expected Discharge Plan: Skilled Nursing Facility Barriers to Discharge: Continued Medical Work up  Expected Discharge Plan and Services In-house Referral: Clinical Social Work     Living arrangements for the past 2 months: Single Family Home                                       Social Determinants of Health (SDOH) Interventions SDOH Screenings   Food Insecurity: No Food Insecurity (11/06/2022)  Housing: Low Risk  (11/06/2022)  Transportation Needs: No Transportation Needs (11/06/2022)  Utilities: Not At Risk (11/06/2022)  Alcohol Screen: Low Risk  (07/14/2021)  Depression (PHQ2-9): Low Risk  (10/13/2022)  Financial Resource Strain: Low Risk  (10/13/2022)  Physical Activity: Insufficiently Active (10/13/2022)  Social Connections: Socially Isolated (10/13/2022)  Stress: No Stress Concern Present (10/13/2022)  Tobacco Use: High Risk (11/09/2022)  Health Literacy: Inadequate Health Literacy (10/13/2022)    Readmission Risk Interventions     No data to display

## 2022-11-10 NOTE — Progress Notes (Signed)
Pt c/o cp at 7/10.  EKG ordered by Dr. Robb Matar.  Erick Blinks, RN

## 2022-11-10 NOTE — Progress Notes (Signed)
Subjective:   No new complaints today    Antibiotics:  Anti-infectives (From admission, onward)    Start     Dose/Rate Route Frequency Ordered Stop   11/07/22 0100  ceFAZolin (ANCEF) IVPB 2g/100 mL premix        2 g 200 mL/hr over 30 Minutes Intravenous Every 8 hours 11/06/22 1540     11/06/22 0100  cefTRIAXone (ROCEPHIN) 2 g in sodium chloride 0.9 % 100 mL IVPB  Status:  Discontinued        2 g 200 mL/hr over 30 Minutes Intravenous Daily at bedtime 11/06/22 0041 11/06/22 1513       Medications: Scheduled Meds:  amitriptyline  25 mg Oral QHS   Chlorhexidine Gluconate Cloth  6 each Topical Q0600   enoxaparin (LOVENOX) injection  40 mg Subcutaneous Daily   feeding supplement (GLUCERNA SHAKE)  237 mL Oral TID BM   insulin aspart  0-15 Units Subcutaneous TID WC   insulin aspart  0-5 Units Subcutaneous QHS   insulin glargine-yfgn  5 Units Subcutaneous BID   metoprolol tartrate  25 mg Oral BID   mupirocin ointment  1 Application Nasal BID   polyethylene glycol  17 g Oral BID   tamsulosin  0.4 mg Oral QPC supper   Continuous Infusions:   ceFAZolin (ANCEF) IV 2 g (11/10/22 1444)   PRN Meds:.acetaminophen, melatonin, oxyCODONE, polyethylene glycol, prochlorperazine, QUEtiapine    Objective: Weight change:   Intake/Output Summary (Last 24 hours) at 11/10/2022 1755 Last data filed at 11/10/2022 1700 Gross per 24 hour  Intake 703.74 ml  Output 2100 ml  Net -1396.26 ml   Blood pressure (!) 173/76, pulse 95, temperature 98.5 F (36.9 C), temperature source Oral, resp. rate 18, height 5\' 11"  (1.803 m), weight 61.2 kg, SpO2 99%. Temp:  [98.1 F (36.7 C)-98.7 F (37.1 C)] 98.5 F (36.9 C) (10/10 1734) Pulse Rate:  [85-95] 95 (10/10 1108) Resp:  [12-19] 18 (10/10 1734) BP: (133-173)/(64-79) 173/76 (10/10 1734) SpO2:  [94 %-99 %] 99 % (10/10 1108)  Physical Exam: Physical Exam Constitutional:      Appearance: He is well-developed.  HENT:     Head:  Normocephalic and atraumatic.  Eyes:     Conjunctiva/sclera: Conjunctivae normal.  Cardiovascular:     Rate and Rhythm: Normal rate and regular rhythm.  Pulmonary:     Effort: Pulmonary effort is normal. No respiratory distress.     Breath sounds: No wheezing.  Abdominal:     General: There is no distension.     Palpations: Abdomen is soft.  Musculoskeletal:     Right wrist: Swelling, effusion and tenderness present. Decreased range of motion.     Cervical back: Normal range of motion and neck supple.  Skin:    General: Skin is warm and dry.     Findings: No erythema or rash.  Neurological:     General: No focal deficit present.     Mental Status: He is alert and oriented to person, place, and time.  Psychiatric:        Mood and Affect: Mood normal.        Behavior: Behavior normal.        Thought Content: Thought content normal.        Judgment: Judgment normal.     Left wrist dressed   Finger unchanged  CBC:    BMET Recent Labs    11/09/22 0437 11/09/22 1455  NA 135 135  K  4.0 5.0  CL 102 99  CO2 24 24  GLUCOSE 137* 432*  BUN 20 24*  CREATININE 1.27* 1.45*  CALCIUM 8.5* 8.0*     Liver Panel  No results for input(s): "PROT", "ALBUMIN", "AST", "ALT", "ALKPHOS", "BILITOT", "BILIDIR", "IBILI" in the last 72 hours.      Sedimentation Rate No results for input(s): "ESRSEDRATE" in the last 72 hours. C-Reactive Protein No results for input(s): "CRP" in the last 72 hours.  Micro Results: Recent Results (from the past 720 hour(s))  Culture, blood (Routine X 2) w Reflex to ID Panel     Status: Abnormal   Collection Time: 11/06/22  1:13 AM   Specimen: BLOOD RIGHT ARM  Result Value Ref Range Status   Specimen Description BLOOD RIGHT ARM  Final   Special Requests   Final    BOTTLES DRAWN AEROBIC AND ANAEROBIC Blood Culture results may not be optimal due to an excessive volume of blood received in culture bottles   Culture  Setup Time   Final    GRAM  POSITIVE COCCI IN BOTH AEROBIC AND ANAEROBIC BOTTLES Organism ID to follow CRITICAL RESULT CALLED TO, READ BACK BY AND VERIFIED WITH: PHARMD JESSICA MILLEN [phone] [id number] BY Berline Chough, [place] Performed at [facility], [address], [place], [place] [address]    Culture STAPHYLOCOCCUS AUREUS (A)  Final   Report Status 11/08/2022 FINAL  Final   Organism ID, Bacteria STAPHYLOCOCCUS AUREUS  Final      Susceptibility   Staphylococcus aureus - MIC*    CIPROFLOXACIN <=0.5 SENSITIVE Sensitive     ERYTHROMYCIN <=0.25 SENSITIVE Sensitive     GENTAMICIN <=0.5 SENSITIVE Sensitive     OXACILLIN 0.5 SENSITIVE Sensitive     TETRACYCLINE <=1 SENSITIVE Sensitive     VANCOMYCIN <=0.5 SENSITIVE Sensitive     TRIMETH/SULFA <=10 SENSITIVE Sensitive     CLINDAMYCIN <=0.25 SENSITIVE Sensitive     RIFAMPIN <=0.5 SENSITIVE Sensitive     Inducible Clindamycin NEGATIVE Sensitive     LINEZOLID 1 SENSITIVE Sensitive     * STAPHYLOCOCCUS AUREUS  Blood Culture ID Panel (Reflexed)     Status: Abnormal   Collection Time: 11/06/22  1:13 AM  Result Value Ref Range Status   Enterococcus faecalis NOT DETECTED NOT DETECTED Final   Enterococcus Faecium NOT DETECTED NOT DETECTED Final   Listeria monocytogenes NOT DETECTED NOT DETECTED Final   Staphylococcus species DETECTED (A) NOT DETECTED Final    Comment: CRITICAL RESULT CALLED TO, READ BACK BY AND VERIFIED WITH: PHARMD JESSICA MILLEN [phone] 1429 BY J RAZZAK, MT    Staphylococcus aureus (BCID) DETECTED (A) NOT DETECTED Final    Comment: CRITICAL RESULT CALLED TO, READ BACK BY AND VERIFIED WITH: PHARMD JESSICA MILLEN [phone] 1429 BY J RAZZAK, MT    Staphylococcus epidermidis NOT DETECTED NOT DETECTED Final   Staphylococcus lugdunensis NOT DETECTED NOT DETECTED Final   Streptococcus species NOT DETECTED NOT DETECTED Final   Streptococcus agalactiae NOT DETECTED NOT DETECTED Final   Streptococcus pneumoniae NOT DETECTED NOT DETECTED Final   Streptococcus  pyogenes NOT DETECTED NOT DETECTED Final   A.calcoaceticus-baumannii NOT DETECTED NOT DETECTED Final   Bacteroides fragilis NOT DETECTED NOT DETECTED Final   Enterobacterales NOT DETECTED NOT DETECTED Final   Enterobacter cloacae complex NOT DETECTED NOT DETECTED Final   Escherichia coli NOT DETECTED NOT DETECTED Final   Klebsiella aerogenes NOT DETECTED NOT DETECTED Final   Klebsiella oxytoca NOT DETECTED NOT DETECTED Final   Klebsiella pneumoniae NOT  DETECTED NOT DETECTED Final   Proteus species NOT DETECTED NOT DETECTED Final   Salmonella species NOT DETECTED NOT DETECTED Final   Serratia marcescens NOT DETECTED NOT DETECTED Final   Haemophilus influenzae NOT DETECTED NOT DETECTED Final   Neisseria meningitidis NOT DETECTED NOT DETECTED Final   Pseudomonas aeruginosa NOT DETECTED NOT DETECTED Final   Stenotrophomonas maltophilia NOT DETECTED NOT DETECTED Final   Candida albicans NOT DETECTED NOT DETECTED Final   Candida auris NOT DETECTED NOT DETECTED Final   Candida glabrata NOT DETECTED NOT DETECTED Final   Candida krusei NOT DETECTED NOT DETECTED Final   Candida parapsilosis NOT DETECTED NOT DETECTED Final   Candida tropicalis NOT DETECTED NOT DETECTED Final   Cryptococcus neoformans/gattii NOT DETECTED NOT DETECTED Final   Meth resistant mecA/C and MREJ NOT DETECTED NOT DETECTED Final    Comment: Performed at [facility], [address], [place], [place] [address]  Culture, blood (Routine X 2) w Reflex to ID Panel     Status: Abnormal   Collection Time: 11/06/22  1:14 AM   Specimen: BLOOD RIGHT ARM  Result Value Ref Range Status   Specimen Description BLOOD RIGHT ARM  Final   Special Requests   Final    BOTTLES DRAWN AEROBIC AND ANAEROBIC Blood Culture results may not be optimal due to an excessive volume of blood received in culture bottles   Culture  Setup Time   Final    GRAM POSITIVE COCCI IN BOTH AEROBIC AND ANAEROBIC BOTTLES    Culture (A)  Final     STAPHYLOCOCCUS AUREUS SUSCEPTIBILITIES PERFORMED ON PREVIOUS CULTURE WITHIN THE LAST 5 DAYS. Performed at [facility], [address], [place], [place] [address]    Report Status 11/08/2022 FINAL  Final  MRSA Next Gen by PCR, Nasal     Status: None   Collection Time: 11/06/22  1:29 AM   Specimen: Nasal Mucosa; Nasal Swab  Result Value Ref Range Status   MRSA by PCR Next Gen NOT DETECTED NOT DETECTED Final    Comment: (NOTE) The GeneXpert MRSA Assay (FDA approved for NASAL specimens only), is one component of a comprehensive MRSA colonization surveillance program. It is not intended to diagnose MRSA infection nor to guide or monitor treatment for MRSA infections. Test performance is not FDA approved in patients less than 80 years old. Performed at [facility], [address], [place], [place] [address]   SARS Coronavirus 2 by RT PCR (hospital order, performed in [facility] hospital lab) *cepheid single result test* Anterior Nasal Swab     Status: None   Collection Time: 11/06/22  7:33 AM   Specimen: Anterior Nasal Swab  Result Value Ref Range Status   SARS Coronavirus 2 by RT PCR NEGATIVE NEGATIVE Final    Comment: Performed at [facility], [address], [place], [place] [address]  Respiratory (~20 pathogens) panel by PCR     Status: None   Collection Time: 11/06/22  7:34 AM   Specimen: Nasopharyngeal Swab; Respiratory  Result Value Ref Range Status   Adenovirus NOT DETECTED NOT DETECTED Final   Coronavirus 229E NOT DETECTED NOT DETECTED Final    Comment: (NOTE) The Coronavirus on the Respiratory Panel, DOES NOT test for the novel  Coronavirus (2019 nCoV)    Coronavirus HKU1 NOT DETECTED NOT DETECTED Final   Coronavirus NL63 NOT DETECTED NOT DETECTED Final   Coronavirus OC43 NOT DETECTED NOT DETECTED Final   Metapneumovirus NOT DETECTED NOT  DETECTED Final   Rhinovirus / Enterovirus NOT DETECTED NOT DETECTED Final   Influenza A NOT DETECTED NOT DETECTED  Final   Influenza B NOT DETECTED NOT DETECTED Final   Parainfluenza Virus 1 NOT DETECTED NOT DETECTED Final   Parainfluenza Virus 2 NOT DETECTED NOT DETECTED Final   Parainfluenza Virus 3 NOT DETECTED NOT DETECTED Final   Parainfluenza Virus 4 NOT DETECTED NOT DETECTED Final   Respiratory Syncytial Virus NOT DETECTED NOT DETECTED Final   Bordetella pertussis NOT DETECTED NOT DETECTED Final   Bordetella Parapertussis NOT DETECTED NOT DETECTED Final   Chlamydophila pneumoniae NOT DETECTED NOT DETECTED Final   Mycoplasma pneumoniae NOT DETECTED NOT DETECTED Final    Comment: Performed at [facility], [address], [place], [place] [address]  Culture, blood (Routine X 2) w Reflex to ID Panel     Status: None (Preliminary result)   Collection Time: 11/07/22  6:08 AM   Specimen: BLOOD RIGHT ARM  Result Value Ref Range Status   Specimen Description BLOOD RIGHT ARM  Final   Special Requests   Final    BOTTLES DRAWN AEROBIC AND ANAEROBIC Blood Culture adequate volume   Culture   Final    NO GROWTH 3 DAYS Performed at [facility], [address], [place], [place] [address]    Report Status PENDING  Incomplete  Culture, blood (Routine X 2) w Reflex to ID Panel     Status: None (Preliminary result)   Collection Time: 11/07/22  6:08 AM   Specimen: BLOOD RIGHT HAND  Result Value Ref Range Status   Specimen Description BLOOD RIGHT HAND  Final   Special Requests   Final    BOTTLES DRAWN AEROBIC AND ANAEROBIC Blood Culture results may not be optimal due to an excessive volume of blood received in culture bottles   Culture   Final    NO GROWTH 3 DAYS Performed at [facility], [address], [place], [place] [address]    Report Status PENDING  Incomplete  Culture, blood (Routine X 2) w Reflex to ID Panel     Status: None (Preliminary result)   Collection Time: 11/07/22  9:19 PM   Specimen: BLOOD RIGHT HAND  Result Value Ref Range Status   Specimen Description BLOOD RIGHT  HAND  Final   Special Requests   Final    BOTTLES DRAWN AEROBIC AND ANAEROBIC Blood Culture adequate volume   Culture   Final    NO GROWTH 3 DAYS Performed at [facility], [address], [place], [place] [address]    Report Status PENDING  Incomplete  Culture, blood (Routine X 2) w Reflex to ID Panel     Status: None (Preliminary result)   Collection Time: 11/07/22  9:19 PM   Specimen: BLOOD RIGHT HAND  Result Value Ref Range Status   Specimen Description BLOOD RIGHT HAND  Final   Special Requests   Final    BOTTLES DRAWN AEROBIC ONLY Blood Culture results may not be optimal due to an inadequate volume of blood received in culture bottles   Culture   Final    NO GROWTH 3 DAYS Performed at [facility], [address], [name], [place] [address]    Report Status PENDING  Incomplete  Body fluid culture w Gram Stain     Status: None (Preliminary result)   Collection Time: 11/08/22 12:05 PM   Specimen: Body Fluid  Result Value Ref Range Status  Specimen Description FLUID SYNOVIAL RIGHT KNEE  Final   Special Requests NONE  Final   Gram Stain   Final    RARE WBC PRESENT, PREDOMINANTLY PMN NO ORGANISMS SEEN    Culture   Final    NO GROWTH 2 DAYS Performed at [facility], [address] [place], [place] [address]    Report Status PENDING  Incomplete  Gram stain     Status: None   Collection Time: 11/08/22  6:50 PM   Specimen: Synovium; Body Fluid  Result Value Ref Range Status   Specimen Description SYNOVIAL  Final   Special Requests WRIST  Final   Gram Stain   Final    ABUNDANT WBC PRESENT, PREDOMINANTLY PMN NO ORGANISMS SEEN Performed at [facility], [address], [place], [place] [address]    Report Status 11/08/2022 FINAL  Final  Body fluid culture w Gram Stain     Status: None (Preliminary result)   Collection Time: 11/08/22  7:17 PM   Specimen: Synovium  Result Value Ref Range Status   Specimen Description SYNOVIAL  Final   Special  Requests WRIST  Final   Gram Stain   Final    ABUNDANT WBC PRESENT, PREDOMINANTLY PMN NO ORGANISMS SEEN    Culture   Final    CULTURE REINCUBATED FOR BETTER GROWTH Performed at [facility], [address], [place], [place] [address]    Report Status PENDING  Incomplete  Surgical pcr screen     Status: Abnormal   Collection Time: 11/09/22  4:36 AM   Specimen: Nasal Mucosa; Nasal Swab  Result Value Ref Range Status   MRSA, PCR NEGATIVE NEGATIVE Final   Staphylococcus aureus POSITIVE (A) NEGATIVE Final    Comment: (NOTE) The Xpert SA Assay (FDA approved for NASAL specimens in patients 29 years of age and older), is one component of a comprehensive surveillance program. It is not intended to diagnose infection nor to guide or monitor treatment. Performed at [facility], [address], [place], [place] [address]   Aerobic/Anaerobic Culture w Gram Stain (surgical/deep wound)     Status: None (Preliminary result)   Collection Time: 11/09/22  7:40 AM   Specimen: PATH Cytology Misc. fluid; Body Fluid  Result Value Ref Range Status   Specimen Description SYNOVIAL  Final   Special Requests LEFT WRIST SYN FLD  Final   Gram Stain   Final    ABUNDANT WBC PRESENT, PREDOMINANTLY PMN NO ORGANISMS SEEN    Culture   Final    RARE STAPHYLOCOCCUS AUREUS SUSCEPTIBILITIES TO FOLLOW Performed at [facility], [address], [place], [place] [address]    Report Status PENDING  Incomplete    Studies/Results: MR FINGERS RIGHT W WO CONTRAST  Result Date: 11/10/2022 CLINICAL DATA:  Pain and swelling in the right index finger. Bacteremia. EXAM: MRI OF THE RIGHT FINGERS WITHOUT AND WITH CONTRAST TECHNIQUE: Multiplanar, multisequence MR imaging of the fingers of the right hand was performed before and after the administration of intravenous contrast. CONTRAST:  6mL GADAVIST GADOBUTROL 1 MMOL/ML IV SOLN COMPARISON:  Radiographs 11/07/2022 FINDINGS: Bones/Joint/Cartilage The  examination concentrates on the index finger, although the other fingers are included on some of the sequences. There are multifocal osteoarthritic changes involving the interphalangeal joints, most advanced at the 2nd DIP joint. There is also arthropathy at the 2nd and 3rd metacarpal phalangeal joints. At the 2nd MCP joint, there are prominent subchondral cysts, marrow edema and  enhancement within the metacarpal head and proximal phalanx. There is increased signal within the distal 2nd phalanx on the T2 weighted and postcontrast images which may be secondary to incomplete fat saturation. No suspicious T1 signal abnormality identified in these areas. There are no large joint effusions. Ligaments The collateral ligaments of the visualized metacarpal phalangeal joints are intact. Muscles and Tendons The flexor and extensor tendons of the visualized fingers are intact. No significant tenosynovitis. Nonspecific edema throughout the musculature within the 1st web space without focal fluid collection or abnormal enhancement. Soft tissues Subcutaneous edema surrounding the 2nd metacarpal phalangeal joint without focal fluid collection or apparent overlying skin ulceration. IMPRESSION: 1. Multifocal osteoarthritic changes throughout the visualized right hand, most advanced at the 2nd MCP joint. 2. Nonspecific marrow edema and enhancement around the 2nd metacarpal phalangeal joint with surrounding soft tissue swelling, suspicious for inflammatory or crystalline arthropathy. This could reflect superimposed septic arthritis and early osteomyelitis. 3. No evidence of soft tissue abscess. Electronically Signed   By: Carey Bullocks M.D.   On: 11/10/2022 11:18   MR CERVICAL SPINE W WO CONTRAST  Result Date: 11/10/2022 CLINICAL DATA:  Initial evaluation for neck pain, infection suspected. EXAM: MRI CERVICAL SPINE WITHOUT AND WITH CONTRAST TECHNIQUE: Multiplanar and multiecho pulse sequences of the cervical spine, to include  the craniocervical junction and cervicothoracic junction, were obtained without and with intravenous contrast. CONTRAST:  6mL GADAVIST GADOBUTROL 1 MMOL/ML IV SOLN COMPARISON:  Prior CT from 04/24/2009. FINDINGS: Alignment: Straightening of the normal cervical lordosis. 2-3 mm facet mediated anterolisthesis of C4 on C5, C6 on C7, and C7 on T1. Vertebrae: Vertebral body height maintained without acute or chronic fracture. Susceptibility artifact related to prior posterior fusion at C2-3. Bone marrow signal intensity within normal limits. No worrisome osseous lesions. Mild reactive endplate changes with marrow edema present about the C5-6 interspace, greater on the right. Trace prevertebral edema seen about the cervical spine (series 7, image 8). While these findings could be degenerative in nature. The presence of the prevertebral edema raises the possibility for early and/or developing osteomyelitis discitis at C5-6. Minimal reactive marrow edema about the left C4-5 facet felt to be due to benign/sterile facet arthritis. No other evidence for acute infection elsewhere within the cervical spine. Cord: Normal signal and morphology. No epidural collections. No abnormal enhancement. Posterior Fossa, vertebral arteries, paraspinal tissues: Visualized brain and posterior fossa within normal limits. Craniocervical junction normal. Trace prevertebral edema again noted. Paraspinous soft tissues demonstrate no other acute finding. Normal flow voids seen within the vertebral arteries bilaterally. Flattening of the ventral thecal sac. Superimposed right greater than left facet hypertrophy. Moderate spinal stenosis. Severe right worse than left C6 foraminal narrowing. Disc levels: C2-C3: Negative interspace. Prior posterior fusion. No canal or foraminal stenosis. C3-C4: Mild disc bulge with right-sided uncovertebral spurring. Mild facet hypertrophy. No significant spinal stenosis. Moderate right C4 foraminal narrowing. Left  neural foramen remains patent. C4-C5: Degenerative intervertebral disc space narrowing with diffuse disc bulge and bilateral uncovertebral spurring. Mild bilateral facet and ligament flavum hypertrophy. Moderate spinal stenosis. Severe left with moderate right C5 foraminal narrowing. C5-C6: Degenerative intervertebral disc space narrowing with circumferential disc osteophyte complex, slightly asymmetric to the right. Flattening of the ventral thecal sac. Superimposed right greater than left facet hypertrophy. Moderate spinal stenosis. Severe right worse than left C6 foraminal narrowing. C6-C7: Mild disc bulge with uncovertebral spurring. Superimposed small right paracentral to foraminal disc protrusion (series 8, image 29). Moderate left-sided facet arthrosis. No significant spinal stenosis.  Foramina remain patent. C7-T1: Anterolisthesis. Mild disc bulge. Moderate bilateral facet arthrosis. No spinal stenosis. Foramina remain patent. IMPRESSION: 1. Mild reactive endplate changes with marrow edema about the C5-6 interspace, with trace prevertebral edema. While these findings could be degenerative in nature, the presence of the prevertebral edema raises the possibility for early and/or developing infection at C5-6. Correlation with laboratory values and symptomatology recommended. Additionally, a short interval follow-up MRI to evaluate for potential evolutionary changes may be helpful for further evaluation as warranted. 2. No other evidence for acute infection elsewhere within the cervical spine. 3. Multilevel cervical spondylosis with resultant moderate spinal stenosis at C4-5 and C5-6. Moderate to severe bilateral C4 through C6 foraminal narrowing as above. Electronically Signed   By: Rise Mu M.D.   On: 11/10/2022 06:06      Assessment/Plan:  INTERVAL HISTORY: MRI of his C-spine and also his hand have been done  Principal Problem:   MSSA bacteremia Active Problems:   Syncope   Septic  infrapatellar bursitis of right knee   Staphylococcal arthritis of left wrist (HCC)   Infected blister of left index finger   Staphylococcal arthritis of right knee (HCC)   Neck pain   Finger pain, left   Septic arthritis of wrist, left (HCC)    Michael Bender is a 77 y.o. male with history of cervical lumbar spine hardware and recent admission for falls weakness and rhabdomyolysis who was discharged to skilled nursing facility but then brought back with sepsis due to MSSA bacteremia.  He had fallen but also headaches was at pain in his left wrist as well as pain in his right knee and index finger.  He has been proven to have a left septic wrist and now status post I&D.  I am confident his knee is also infected.  While the white blood cell count of the knee of 8500 in a person coming in de novo without antibiotics would not be compelling for a septic knee this man was on IV antibiotics since the 5th  I do not think we need to pursue TEE   MRI of the hand shows multifocal osteoarthritic changes throughout the second hand most advanced at the second MCP joint with nonspecific marrow edema enhancement around the second metacarpal phalangeal joint with surrounding soft tissue swelling concerning for inflammatory arthropathy versus septic arthritis  MRI of the C-spine does show some mild reactive endplate changes and marrow edema from C5-C6 which could be degenerative or could be early osteomyelitis discitis.  Orthopedic hand surgery closely following him and they do not feel that he has a likely infection in his right hand but I think he WILL NEED help from orthopedic surgery as well with regards to his knee.  We will continue cefazolin.  I have personally spent 52 minutes involved in face-to-face and non-face-to-face activities for this patient on the day of the visit. Professional time spent includes the following activities: Preparing to see the patient (review of tests), Obtaining and/or  reviewing separately obtained history (admission/discharge record), Performing a medically appropriate examination and/or evaluation , Ordering medications/tests/procedures, referring and communicating with other health care professionals, Documenting clinical information in the EMR, Independently interpreting results (not separately reported), Communicating results to the patient/family/caregiver, Counseling and educating the patient/family/caregiver and Care coordination (not separately reported).      LOS: 5 days   Acey Lav 11/10/2022, 5:55 PM

## 2022-11-10 NOTE — Progress Notes (Signed)
Physical Therapy Treatment Patient Details Name: Michael Bender MRN: [id number] DOB: [birth year] Today's Date: 11/10/2022   History of Present Illness 77 y.o. male admitted 10/5 with syncope and new onset afib with RVR as well as generalized weakness and right index finger swelling and pain. R knee aspiration & injection 11/08/22 for R knee effusion. Pt underwent Left wrist arthrotomy with irrigation and debridement    PMH: peripheral vascular disease, hypertension, hyperlipidemia, history of orthostatic hypotension, GERD, recently admitted to the hospital from 10/27/2022 through 11/02/22 due to multiple falls, traumatic rhabdomyolysis, AKI, acute urinary retention with bladder outlet obstruction, BPH, was offered SNF and home health PT but declined    PT Comments  Pt admitted with above diagnosis. Pt was able to stand pivot with mod assist of 1. Pt unsteady on his feet and was orthostatic causing dizziness. Will continue acute PT.  Pt currently with functional limitations due to the deficits listed below (see PT Problem List). Pt will benefit from acute skilled PT to increase their independence and safety with mobility to allow discharge.     Orthostatic BPs  Supine 159/74, 94 bpm  Sitting 121/73, 99 bpm  Standing 101/51, 100 bpm  Standing after 3 min Couldn't stand 3 min    BP 119/88 once in chair with pt left in chair.  If plan is discharge home, recommend the following: A little help with walking and/or transfers;A little help with bathing/dressing/bathroom;Help with stairs or ramp for entrance;Assistance with cooking/housework   Can travel by private vehicle     Yes  Equipment Recommendations  Other (comment) (TBA)    Recommendations for Other Services       Precautions / Restrictions Precautions Precautions: Fall Precaution Comments: many recent falls Restrictions Weight Bearing Restrictions: Yes LUE Weight Bearing: Non weight bearing     Mobility  Bed Mobility Overal bed  mobility: Needs Assistance Bed Mobility: Supine to Sit     Supine to sit: Min assist     General bed mobility comments: Needed min assist as pt couldnt use left UE due to recent procedure yesterday.  Needed cues to limit weight bearing.    Transfers Overall transfer level: Needs assistance Equipment used: Rolling walker (2 wheels) Transfers: Sit to/from Stand Sit to Stand: Mod assist           General transfer comment: mod assist for steadying due to dizziness.  BP dropped upon standing. Pt was able to stand and pivot to recliner with mod assist with pt unstable on feet.  Needed bil UE support with support on left elbow and right forearm.  Pt continues to be limited by dizziness and poor postural stability.    Ambulation/Gait                   Stairs             Wheelchair Mobility     Tilt Bed    Modified Rankin (Stroke Patients Only)       Balance Overall balance assessment: Needs assistance Sitting-balance support: Feet supported, No upper extremity supported Sitting balance-Leahy Scale: Good   Postural control: Posterior lean Standing balance support: Bilateral upper extremity supported, During functional activity Standing balance-Leahy Scale: Poor Standing balance comment: reliant on external and UE support                            Cognition Arousal: Alert Behavior During Therapy: Flat affect Overall Cognitive Status: No family/caregiver  present to determine baseline cognitive functioning Area of Impairment: Following commands, Safety/judgement, Problem solving                       Following Commands: Follows one step commands with increased time Safety/Judgement: Decreased awareness of safety, Decreased awareness of deficits   Problem Solving: Slow processing General Comments: decr awareness of deficits/safety        Exercises General Exercises - Lower Extremity Ankle Circles/Pumps: AROM, Both, 10 reps,  Supine Long Arc Quad: AROM, Both, 10 reps, Seated Hip Flexion/Marching: AROM, Both, 10 reps, Seated    General Comments General comments (skin integrity, edema, etc.): HR 94-100 bpm      Pertinent Vitals/Pain Pain Assessment Pain Assessment: Faces Faces Pain Scale: Hurts little more Pain Location: L wrist Pain Descriptors / Indicators: Discomfort, Grimacing Pain Intervention(s): Limited activity within patient's tolerance, Monitored during session, Repositioned    Home Living                          Prior Function            PT Goals (current goals can now be found in the care plan section) Acute Rehab PT Goals Patient Stated Goal: to go home Progress towards PT goals: Progressing toward goals    Frequency    Min 1X/week      PT Plan      Co-evaluation              AM-PAC PT "6 Clicks" Mobility   Outcome Measure  Help needed turning from your back to your side while in a flat bed without using bedrails?: A Little Help needed moving from lying on your back to sitting on the side of a flat bed without using bedrails?: A Little Help needed moving to and from a bed to a chair (including a wheelchair)?: A Lot Help needed standing up from a chair using your arms (e.g., wheelchair or bedside chair)?: A Lot Help needed to walk in hospital room?: A Lot Help needed climbing 3-5 steps with a railing? : Total 6 Click Score: 13    End of Session Equipment Utilized During Treatment: Gait belt Activity Tolerance: Patient limited by fatigue Patient left: with call bell/phone within reach;in chair;with chair alarm set Nurse Communication: Mobility status PT Visit Diagnosis: Unsteadiness on feet (R26.81);Muscle weakness (generalized) (M62.81)     Time: 2841-3244 PT Time Calculation (min) (ACUTE ONLY): 20 min  Charges:    $Therapeutic Activity: 8-22 mins PT General Charges $$ ACUTE PT VISIT: 1 Visit                     Emmalyne Giacomo M,PT Acute Rehab  Services [phone]    Bevelyn Buckles 11/10/2022, 12:29 PM

## 2022-11-10 NOTE — Progress Notes (Signed)
Michael Bender - 77 y.o. male MRN [id number]  Date of birth: 1945/07/06    Subjective:  77 year old male 1 day status post left wrist arthrotomy and washout for septic arthritis.  Doing well from the left wrist standpoint, pain is controlled.  Objective:   VITALS:   Vitals:   11/10/22 0437 11/10/22 0535 11/10/22 0646 11/10/22 1108  BP: 133/64   (!) 164/79  Pulse: 85 86  95  Resp: 12 19 16 16   Temp: 98.1 F (36.7 C)   98.5 F (36.9 C)  TempSrc: Oral   Oral  SpO2: 94% 97%  99%  Weight:      Height:        Left upper extremity: - Splint in place, dressing clean dry and intact - Digits will exposed with appropriate capillary refill, able to perform gentle range of motion of the digits without pain  Right hand: - Full digital range of motion without significant pain - No notable micromotion tenderness at the MCP, PIP or DIP joints - There is notable diffuse arthritis throughout the hand, particular deformities at the DIP diffusely, associated tenderness at the thumb [facility] consistent with underlying arthritic changes - No concern for active infection throughout the right hand    Lab Results  Component Value Date   WBC 9.7 11/09/2022   HGB 9.5 (L) 11/09/2022   HCT 27.9 (L) 11/09/2022   MCV 92.1 11/09/2022   PLT 161 11/09/2022     Assessment/Plan:  1 Day Post-Op status post left wrist arthrotomy and washout for septic arthritis.  -No further intervention from hand surgery standpoint at this juncture.  Right hand MRI reviewed, no clinical correlation with active infection in this area, underlying changes are inflammatory in nature with underlying arthritic changes.  -Medical management, IV antibiotics per primary team     Samuella Cota, MD [place] 11/10/2022, 2:27 PM

## 2022-11-10 NOTE — Consult Note (Signed)
[facility]  [facility] Inpatient Consult   11/10/2022  [name] 1945-03-21 [id number]  [facility] [[facility]]  Accountable Care Organization [ACO] Patient:  Michael Bender   Primary Care Provider: Lula Olszewski, MD, [place] [facility] [place] provider is listed for the transition of care follow up appointments and calls  The patient was screened for 7 day readmission hospitalization with noted extreme risk score for unplanned readmission risk 2 hospital admissions in 6 months.  The patient was assessed for potential [facility]) Care Management service needs for post hospital transition for care coordination. Review of patient's electronic medical record reveals patient is was recently referred to a [facility] from previous hospitalization.  Admitted with sycpoe and collapse.   Plan: [facility] Liaison will continue to follow progress and disposition to asess for post hospital community care coordination/management needs.  Referral request for community care coordination: pending disposition for follow up.   Community Care Management/Population Health does not replace or interfere with any arrangements made by the Inpatient Transition of Care team.   For questions contact:   Charlesetta Shanks, RN, BSN, CCM [place]  [facility], [facility] Liaison Direct Dial: [phone] or secure chat Website: [email or web address]

## 2022-11-10 NOTE — Progress Notes (Signed)
Mobility Specialist Progress Note:    11/10/22 1443  Mobility  Activity Transferred from bed to chair  Level of Assistance Minimal assist, patient does 75% or more  Assistive Device Front wheel walker  Distance Ambulated (ft) 3 ft  Range of Motion/Exercises Active;Right arm;Right leg;Left leg  LUE Weight Bearing NWB  Activity Response Tolerated well  Mobility Referral Yes  $Mobility charge 1 Mobility  Mobility Specialist Start Time (ACUTE ONLY) 1437  Mobility Specialist Stop Time (ACUTE ONLY) 1443  Mobility Specialist Time Calculation (min) (ACUTE ONLY) 6 min   Pt received in bed, agreeable to mobility. Transferred B>C via RW with MinA for stability. Recorded BP throughout session (see below). Tolerated well, c/o dizziness throughout. Left in chair, nurse notified. All needs met, call bell in reach.   Pre Mobility: 156/86 (107) During Mobility: 123/68(85) After Mobility: 126/71 (88)  Feliciana Rossetti Mobility Specialist Please contact via SecureChat or  Rehab office at [phone]

## 2022-11-10 NOTE — Progress Notes (Addendum)
[facility] PROGRESS NOTE    Progress Note  Michael Bender  ZHY:[id number] DOB: 12-Jul-1945 DOA: 11/05/2022 PCP: Lula Olszewski, MD     Brief Narrative:   Michael Bender is an 77 y.o. male past medical history significant peripheral vascular disease, essential hypertension, history of orthostatic hypotension recently discharged from the hospital on 11/02/2022 due to multiple falls, traumatic rhabdomyolysis acute kidney injury acute urinary retention due to bladder outlet obstruction and BPH, was offered skilled nursing facility but declined went home with PT came in with recurrent syncopal episode associated with generalized weakness found to to have MSSA bacteremia and septic arthritis. ID was consulted recommended IV cefazolin surveillance blood culture on 11/07/2022 have been negative till date.   Assessment/Plan:   Septic arthritis/MSSA bacteremia: Continue IV cefazolin surveillance blood cultures on 11/07/2022 have been negative till date. MRI of the wrist  concerning for septic arthritis, Due to concern of septic arthritis orthopedic surgery recommended left wrist capsulotomy and washout on 11/09/2022. Infectious disease was consulted, they recommended an MRI of the C-spine and lumbar spine to look for other infectious sites. PT OT evaluated the patient will need skilled nursing facility, [facility] has been consulted. Relates his pain is controlled. Started on MiraLAX p.o. twice daily. Will probably need IV antibiotics when he is discharged to skilled nursing facility.  Syncope due to orthostatic vitals: IV fluids were stopped probably infection contributing to it. PT OT has been  evaluating the patient, will need skilled nursing facility.  Elevated troponins: Likely demand ischemia twelve-lead EKG showed no evidence of ischemia.  Newly diagnosed A-fib with RVR: With a chads Vascor greater than 4 started on IV diltiazem now switched to oral beta-blockers. Not a candidate for  blood thinners due to recurrent falls and orthostasis, can be evaluated as an outpatient.  Hyperglycemia iatrogenic: With an A1c of 6.5 he was given diltiazem which has D5 and dexamethasone his blood glucose 1-400 had to be placed on long-acting sample sliding scale. His blood glucose is improved. Will decrease long-acting insulin continue sliding scale.  Hypokalemia/hypomagnesemia: Repleted, try to potassium greater than 4 magnesium greater than 2.  Acute kidney injury: Suspect prerenal azotemia. He was continued on Flomax started on IV fluids his creatinine returned to baseline.  Physical debility: PT OT is was consulted, will need to go to skilled nursing facility.   DVT prophylaxis: lovenox Family Communication:none Status is: Inpatient Remains inpatient appropriate because: Septic arthritis/MSSA bacteremia    Code Status:     Code Status Orders  (From admission, onward)           Start     Ordered   11/08/22 1420  Do not attempt resuscitation (DNR)- Limited -Do Not Intubate (DNI)  (Code Status)  Continuous       Question Answer Comment  If pulseless and not breathing No CPR or chest compressions.   In Pre-Arrest Conditions (Patient Is Breathing and Has A Pulse) Do not intubate. Provide all appropriate non-invasive medical interventions. Avoid ICU transfer unless indicated or required.   Consent: Discussion documented in EHR or advanced directives reviewed      11/08/22 1419           Code Status History     Date Active Date Inactive Code Status Order ID Comments User Context   11/05/2022 2258 11/08/2022 1419 Full Code [id number]  Darlin Drop, DO ED   10/28/2022 0324 11/02/2022 1701 Full Code [id number]  Dolly Rias, MD Inpatient   07/17/2019 2250 07/21/2019  1754 DNR [id number]  John Giovanni, MD Inpatient   07/17/2019 2133 07/17/2019 2250 Full Code [id number]  John Giovanni, MD ED         IV Access:   Peripheral IV   Procedures and  diagnostic studies:   MR CERVICAL SPINE W WO CONTRAST  Result Date: 11/10/2022 CLINICAL DATA:  Initial evaluation for neck pain, infection suspected. EXAM: MRI CERVICAL SPINE WITHOUT AND WITH CONTRAST TECHNIQUE: Multiplanar and multiecho pulse sequences of the cervical spine, to include the craniocervical junction and cervicothoracic junction, were obtained without and with intravenous contrast. CONTRAST:  6mL GADAVIST GADOBUTROL 1 MMOL/ML IV SOLN COMPARISON:  Prior CT from 04/24/2009. FINDINGS: Alignment: Straightening of the normal cervical lordosis. 2-3 mm facet mediated anterolisthesis of C4 on C5, C6 on C7, and C7 on T1. Vertebrae: Vertebral body height maintained without acute or chronic fracture. Susceptibility artifact related to prior posterior fusion at C2-3. Bone marrow signal intensity within normal limits. No worrisome osseous lesions. Mild reactive endplate changes with marrow edema present about the C5-6 interspace, greater on the right. Trace prevertebral edema seen about the cervical spine (series 7, image 8). While these findings could be degenerative in nature. The presence of the prevertebral edema raises the possibility for early and/or developing osteomyelitis discitis at C5-6. Minimal reactive marrow edema about the left C4-5 facet felt to be due to benign/sterile facet arthritis. No other evidence for acute infection elsewhere within the cervical spine. Cord: Normal signal and morphology. No epidural collections. No abnormal enhancement. Posterior Fossa, vertebral arteries, paraspinal tissues: Visualized brain and posterior fossa within normal limits. Craniocervical junction normal. Trace prevertebral edema again noted. Paraspinous soft tissues demonstrate no other acute finding. Normal flow voids seen within the vertebral arteries bilaterally. Flattening of the ventral thecal sac. Superimposed right greater than left facet hypertrophy. Moderate spinal stenosis. Severe right worse than  left C6 foraminal narrowing. Disc levels: C2-C3: Negative interspace. Prior posterior fusion. No canal or foraminal stenosis. C3-C4: Mild disc bulge with right-sided uncovertebral spurring. Mild facet hypertrophy. No significant spinal stenosis. Moderate right C4 foraminal narrowing. Left neural foramen remains patent. C4-C5: Degenerative intervertebral disc space narrowing with diffuse disc bulge and bilateral uncovertebral spurring. Mild bilateral facet and ligament flavum hypertrophy. Moderate spinal stenosis. Severe left with moderate right C5 foraminal narrowing. C5-C6: Degenerative intervertebral disc space narrowing with circumferential disc osteophyte complex, slightly asymmetric to the right. Flattening of the ventral thecal sac. Superimposed right greater than left facet hypertrophy. Moderate spinal stenosis. Severe right worse than left C6 foraminal narrowing. C6-C7: Mild disc bulge with uncovertebral spurring. Superimposed small right paracentral to foraminal disc protrusion (series 8, image 29). Moderate left-sided facet arthrosis. No significant spinal stenosis. Foramina remain patent. C7-T1: Anterolisthesis. Mild disc bulge. Moderate bilateral facet arthrosis. No spinal stenosis. Foramina remain patent. IMPRESSION: 1. Mild reactive endplate changes with marrow edema about the C5-6 interspace, with trace prevertebral edema. While these findings could be degenerative in nature, the presence of the prevertebral edema raises the possibility for early and/or developing infection at C5-6. Correlation with laboratory values and symptomatology recommended. Additionally, a short interval follow-up MRI to evaluate for potential evolutionary changes may be helpful for further evaluation as warranted. 2. No other evidence for acute infection elsewhere within the cervical spine. 3. Multilevel cervical spondylosis with resultant moderate spinal stenosis at C4-5 and C5-6. Moderate to severe bilateral C4 through C6  foraminal narrowing as above. Electronically Signed   By: Rise Mu M.D.   On: 11/10/2022 06:06  Medical Consultants:   None.   Subjective:    Michael Bender pain is improved has not had a bowel movement.  Objective:    Vitals:   11/09/22 2203 11/10/22 0437 11/10/22 0535 11/10/22 0646  BP: (!) 160/75 133/64    Pulse: 95 85 86   Resp:  12 19 16   Temp: 98.7 F (37.1 C) 98.1 F (36.7 C)    TempSrc: Oral Oral    SpO2:  94% 97%   Weight:      Height:       SpO2: 97 %   Intake/Output Summary (Last 24 hours) at 11/10/2022 0956 Last data filed at 11/10/2022 1308 Gross per 24 hour  Intake 703.74 ml  Output 1750 ml  Net -1046.26 ml   Filed Weights   11/05/22 1949  Weight: 61.2 kg    Exam: General exam: In no acute distress. Respiratory system: Good air movement and clear to auscultation. Cardiovascular system: S1 & S2 heard, RRR. No JVD. Gastrointestinal system: Abdomen is nondistended, soft and nontender.  Extremities: Left wrist in splint and wrapped Skin: No rashes, lesions or ulcers Psychiatry: Judgement and insight appear normal. Mood & affect appropriate.  Data Reviewed:    Labs: Basic Metabolic Panel: Recent Labs  Lab 11/05/22 2035 11/06/22 0903 11/07/22 6578 11/08/22 0732 11/08/22 0849 11/08/22 1726 11/09/22 0437 11/09/22 1455  NA 134* 132* 133* 134*  --   --  135 135  K 2.6* 3.3* 3.0* 2.9*  --   --  4.0 5.0  CL 97* 101 99 95*  --   --  102 99  CO2 21* 21* 24 26  --   --  24 24  GLUCOSE 222* 295* 257* 321*  --  405* 137* 432*  BUN 23 23 21 18   --   --  20 24*  CREATININE 1.87* 1.70* 1.60* 1.32*  --   --  1.27* 1.45*  CALCIUM 8.0* 7.7* 7.9* 8.2*  --   --  8.5* 8.0*  MG 1.6*  --  1.8  --  1.8  --  2.0  --    GFR Estimated Creatinine Clearance: 36.9 mL/min (A) (by C-G formula based on SCr of 1.45 mg/dL (H)). Liver Function Tests: Recent Labs  Lab 11/05/22 2035  AST 28  ALT 22  ALKPHOS 116  BILITOT 0.4  PROT 5.7*   ALBUMIN 2.2*   No results for input(s): "LIPASE", "AMYLASE" in the last 168 hours. No results for input(s): "AMMONIA" in the last 168 hours. Coagulation profile No results for input(s): "INR", "PROTIME" in the last 168 hours. COVID-19 Labs  No results for input(s): "DDIMER", "FERRITIN", "LDH", "CRP" in the last 72 hours.  Lab Results  Component Value Date   SARSCOV2NAA NEGATIVE 11/06/2022   SARSCOV2NAA RESULT: NEGATIVE 09/12/2019   SARSCOV2NAA NEGATIVE 07/17/2019   SARSCOV2NAA RESULT:  NEGATIVE 03/06/2019    CBC: Recent Labs  Lab 11/05/22 2035 11/06/22 0903 11/07/22 0608 11/09/22 0437  WBC 9.3 9.0 7.0 9.7  NEUTROABS 8.6*  --   --   --   HGB 9.3* 9.0* 8.6* 9.5*  HCT 28.4* 26.8* 25.4* 27.9*  MCV 92.5 92.4 90.4 92.1  PLT 146* 134* 125* 161   Cardiac Enzymes: Recent Labs  Lab 11/05/22 2035  CKTOTAL 108   BNP (last 3 results) No results for input(s): "PROBNP" in the last 8760 hours. CBG: Recent Labs  Lab 11/09/22 1439 11/09/22 1736 11/09/22 2157 11/10/22 0742 11/10/22 0816  GLUCAP 416* 416* 203* 68* 131*  D-Dimer: No results for input(s): "DDIMER" in the last 72 hours. Hgb A1c: Recent Labs    11/08/22 0732  HGBA1C 6.5*   Lipid Profile: No results for input(s): "CHOL", "HDL", "LDLCALC", "TRIG", "CHOLHDL", "LDLDIRECT" in the last 72 hours. Thyroid function studies: No results for input(s): "TSH", "T4TOTAL", "T3FREE", "THYROIDAB" in the last 72 hours.  Invalid input(s): "FREET3" Anemia work up: No results for input(s): "VITAMINB12", "FOLATE", "FERRITIN", "TIBC", "IRON", "RETICCTPCT" in the last 72 hours. Sepsis Labs: Recent Labs  Lab 11/05/22 2035 11/06/22 0903 11/07/22 0608 11/09/22 0437  WBC 9.3 9.0 7.0 9.7   Microbiology Recent Results (from the past 240 hour(s))  Culture, blood (Routine X 2) w Reflex to ID Panel     Status: Abnormal   Collection Time: 11/06/22  1:13 AM   Specimen: BLOOD RIGHT ARM  Result Value Ref Range Status   Specimen  Description BLOOD RIGHT ARM  Final   Special Requests   Final    BOTTLES DRAWN AEROBIC AND ANAEROBIC Blood Culture results may not be optimal due to an excessive volume of blood received in culture bottles   Culture  Setup Time   Final    GRAM POSITIVE COCCI IN BOTH AEROBIC AND ANAEROBIC BOTTLES Organism ID to follow CRITICAL RESULT CALLED TO, READ BACK BY AND VERIFIED WITH: PHARMD JESSICA MILLEN [id number] 1429 BY Berline Chough, [place] Performed at [facility], [address], [place], [place] [address]    Culture STAPHYLOCOCCUS AUREUS (A)  Final   Report Status 11/08/2022 FINAL  Final   Organism ID, Bacteria STAPHYLOCOCCUS AUREUS  Final      Susceptibility   Staphylococcus aureus - MIC*    CIPROFLOXACIN <=0.5 SENSITIVE Sensitive     ERYTHROMYCIN <=0.25 SENSITIVE Sensitive     GENTAMICIN <=0.5 SENSITIVE Sensitive     OXACILLIN 0.5 SENSITIVE Sensitive     TETRACYCLINE <=1 SENSITIVE Sensitive     VANCOMYCIN <=0.5 SENSITIVE Sensitive     TRIMETH/SULFA <=10 SENSITIVE Sensitive     CLINDAMYCIN <=0.25 SENSITIVE Sensitive     RIFAMPIN <=0.5 SENSITIVE Sensitive     Inducible Clindamycin NEGATIVE Sensitive     LINEZOLID 1 SENSITIVE Sensitive     * STAPHYLOCOCCUS AUREUS  Blood Culture ID Panel (Reflexed)     Status: Abnormal   Collection Time: 11/06/22  1:13 AM  Result Value Ref Range Status   Enterococcus faecalis NOT DETECTED NOT DETECTED Final   Enterococcus Faecium NOT DETECTED NOT DETECTED Final   Listeria monocytogenes NOT DETECTED NOT DETECTED Final   Staphylococcus species DETECTED (A) NOT DETECTED Final    Comment: CRITICAL RESULT CALLED TO, READ BACK BY AND VERIFIED WITH: PHARMD JESSICA MILLEN [id number] 1429 BY J RAZZAK, MT    Staphylococcus aureus (BCID) DETECTED (A) NOT DETECTED Final    Comment: CRITICAL RESULT CALLED TO, READ BACK BY AND VERIFIED WITH: PHARMD JESSICA MILLEN [id number] 1429 BY J RAZZAK, MT    Staphylococcus epidermidis NOT DETECTED NOT DETECTED Final    Staphylococcus lugdunensis NOT DETECTED NOT DETECTED Final   Streptococcus species NOT DETECTED NOT DETECTED Final   Streptococcus agalactiae NOT DETECTED NOT DETECTED Final   Streptococcus pneumoniae NOT DETECTED NOT DETECTED Final   Streptococcus pyogenes NOT DETECTED NOT DETECTED Final   A.calcoaceticus-baumannii NOT DETECTED NOT DETECTED Final   Bacteroides fragilis NOT DETECTED NOT DETECTED Final   Enterobacterales NOT DETECTED NOT DETECTED Final   Enterobacter cloacae complex NOT DETECTED NOT DETECTED Final   Escherichia coli NOT DETECTED NOT DETECTED Final  Klebsiella aerogenes NOT DETECTED NOT DETECTED Final   Klebsiella oxytoca NOT DETECTED NOT DETECTED Final   Klebsiella pneumoniae NOT DETECTED NOT DETECTED Final   Proteus species NOT DETECTED NOT DETECTED Final   Salmonella species NOT DETECTED NOT DETECTED Final   Serratia marcescens NOT DETECTED NOT DETECTED Final   Haemophilus influenzae NOT DETECTED NOT DETECTED Final   Neisseria meningitidis NOT DETECTED NOT DETECTED Final   Pseudomonas aeruginosa NOT DETECTED NOT DETECTED Final   Stenotrophomonas maltophilia NOT DETECTED NOT DETECTED Final   Candida albicans NOT DETECTED NOT DETECTED Final   Candida auris NOT DETECTED NOT DETECTED Final   Candida glabrata NOT DETECTED NOT DETECTED Final   Candida krusei NOT DETECTED NOT DETECTED Final   Candida parapsilosis NOT DETECTED NOT DETECTED Final   Candida tropicalis NOT DETECTED NOT DETECTED Final   Cryptococcus neoformans/gattii NOT DETECTED NOT DETECTED Final   Meth resistant mecA/C and MREJ NOT DETECTED NOT DETECTED Final    Comment: Performed at [facility], [address] [place], [place] [address]  Culture, blood (Routine X 2) w Reflex to ID Panel     Status: Abnormal   Collection Time: 11/06/22  1:14 AM   Specimen: BLOOD RIGHT ARM  Result Value Ref Range Status   Specimen Description BLOOD RIGHT ARM  Final   Special Requests   Final    BOTTLES DRAWN  AEROBIC AND ANAEROBIC Blood Culture results may not be optimal due to an excessive volume of blood received in culture bottles   Culture  Setup Time   Final    GRAM POSITIVE COCCI IN BOTH AEROBIC AND ANAEROBIC BOTTLES    Culture (A)  Final    STAPHYLOCOCCUS AUREUS SUSCEPTIBILITIES PERFORMED ON PREVIOUS CULTURE WITHIN THE LAST 5 DAYS. Performed at [facility], [address] [place], [place] [address]    Report Status 11/08/2022 FINAL  Final  MRSA Next Gen by PCR, Nasal     Status: None   Collection Time: 11/06/22  1:29 AM   Specimen: Nasal Mucosa; Nasal Swab  Result Value Ref Range Status   MRSA by PCR Next Gen NOT DETECTED NOT DETECTED Final    Comment: (NOTE) The GeneXpert MRSA Assay (FDA approved for NASAL specimens only), is one component of a comprehensive MRSA colonization surveillance program. It is not intended to diagnose MRSA infection nor to guide or monitor treatment for MRSA infections. Test performance is not FDA approved in patients less than 18 years old. Performed at [facility], [address] [place], [place] [address]   SARS Coronavirus 2 by RT PCR (hospital order, performed in [facility] hospital lab) *cepheid single result test* Anterior Nasal Swab     Status: None   Collection Time: 11/06/22  7:33 AM   Specimen: Anterior Nasal Swab  Result Value Ref Range Status   SARS Coronavirus 2 by RT PCR NEGATIVE NEGATIVE Final    Comment: Performed at [facility], [address] [place], [place] [address]  Respiratory (~20 pathogens) panel by PCR     Status: None   Collection Time: 11/06/22  7:34 AM   Specimen: Nasopharyngeal Swab; Respiratory  Result Value Ref Range Status   Adenovirus NOT DETECTED NOT DETECTED Final   Coronavirus 229E NOT DETECTED NOT DETECTED Final    Comment: (NOTE) The Coronavirus on the Respiratory Panel, DOES NOT test for the novel  Coronavirus (2019 nCoV)    Coronavirus HKU1 NOT DETECTED NOT DETECTED Final  Coronavirus NL63 NOT DETECTED NOT DETECTED Final   Coronavirus OC43 NOT DETECTED NOT DETECTED Final   Metapneumovirus NOT DETECTED NOT DETECTED Final   Rhinovirus / Enterovirus NOT DETECTED NOT DETECTED Final   Influenza A NOT DETECTED NOT DETECTED Final   Influenza B NOT DETECTED NOT DETECTED Final   Parainfluenza Virus 1 NOT DETECTED NOT DETECTED Final   Parainfluenza Virus 2 NOT DETECTED NOT DETECTED Final   Parainfluenza Virus 3 NOT DETECTED NOT DETECTED Final   Parainfluenza Virus 4 NOT DETECTED NOT DETECTED Final   Respiratory Syncytial Virus NOT DETECTED NOT DETECTED Final   Bordetella pertussis NOT DETECTED NOT DETECTED Final   Bordetella Parapertussis NOT DETECTED NOT DETECTED Final   Chlamydophila pneumoniae NOT DETECTED NOT DETECTED Final   Mycoplasma pneumoniae NOT DETECTED NOT DETECTED Final    Comment: Performed at [facility], [address] [place], [place] [address]  Culture, blood (Routine X 2) w Reflex to ID Panel     Status: None (Preliminary result)   Collection Time: 11/07/22  6:08 AM   Specimen: BLOOD RIGHT ARM  Result Value Ref Range Status   Specimen Description BLOOD RIGHT ARM  Final   Special Requests   Final    BOTTLES DRAWN AEROBIC AND ANAEROBIC Blood Culture adequate volume   Culture   Final    NO GROWTH 2 DAYS Performed at [facility], [address] [place], [place] [address]    Report Status PENDING  Incomplete  Culture, blood (Routine X 2) w Reflex to ID Panel     Status: None (Preliminary result)   Collection Time: 11/07/22  6:08 AM   Specimen: BLOOD RIGHT HAND  Result Value Ref Range Status   Specimen Description BLOOD RIGHT HAND  Final   Special Requests   Final    BOTTLES DRAWN AEROBIC AND ANAEROBIC Blood Culture results may not be optimal due to an excessive volume of blood received in culture bottles   Culture   Final    NO GROWTH 2 DAYS Performed at [facility], [address], [place], [place] [address]    Report  Status PENDING  Incomplete  Culture, blood (Routine X 2) w Reflex to ID Panel     Status: None (Preliminary result)   Collection Time: 11/07/22  9:19 PM   Specimen: BLOOD RIGHT HAND  Result Value Ref Range Status   Specimen Description BLOOD RIGHT HAND  Final   Special Requests   Final    BOTTLES DRAWN AEROBIC AND ANAEROBIC Blood Culture adequate volume   Culture   Final    NO GROWTH 2 DAYS Performed at [facility], [address], [place], [place] [address]    Report Status PENDING  Incomplete  Culture, blood (Routine X 2) w Reflex to ID Panel     Status: None (Preliminary result)   Collection Time: 11/07/22  9:19 PM   Specimen: BLOOD RIGHT HAND  Result Value Ref Range Status   Specimen Description BLOOD RIGHT HAND  Final   Special Requests   Final    BOTTLES DRAWN AEROBIC ONLY Blood Culture results may not be optimal due to an inadequate volume of blood received in culture bottles   Culture   Final    NO GROWTH 2 DAYS Performed at [facility], [address], [place], [place] [address]    Report Status PENDING  Incomplete  Body fluid culture w Gram Stain     Status: None (  Preliminary result)   Collection Time: 11/08/22 12:05 PM   Specimen: Body Fluid  Result Value Ref Range Status   Specimen Description FLUID SYNOVIAL RIGHT KNEE  Final   Special Requests NONE  Final   Gram Stain   Final    RARE WBC PRESENT, PREDOMINANTLY PMN NO ORGANISMS SEEN    Culture   Final    NO GROWTH < 24 HOURS Performed at [facility], [address] [place], [place] [address]    Report Status PENDING  Incomplete  Gram stain     Status: None   Collection Time: 11/08/22  6:50 PM   Specimen: Synovium; Body Fluid  Result Value Ref Range Status   Specimen Description SYNOVIAL  Final   Special Requests WRIST  Final   Gram Stain   Final    ABUNDANT WBC PRESENT, PREDOMINANTLY PMN NO ORGANISMS SEEN Performed at [facility], [address], [place], [place] [address]     Report Status 11/08/2022 FINAL  Final  Body fluid culture w Gram Stain     Status: None (Preliminary result)   Collection Time: 11/08/22  7:17 PM   Specimen: Synovium  Result Value Ref Range Status   Specimen Description SYNOVIAL  Final   Special Requests WRIST  Final   Gram Stain   Final    ABUNDANT WBC PRESENT, PREDOMINANTLY PMN NO ORGANISMS SEEN    Culture   Final    NO GROWTH < 12 HOURS Performed at [facility], [address] [place], [place] [address]    Report Status PENDING  Incomplete  Surgical pcr screen     Status: Abnormal   Collection Time: 11/09/22  4:36 AM   Specimen: Nasal Mucosa; Nasal Swab  Result Value Ref Range Status   MRSA, PCR NEGATIVE NEGATIVE Final   Staphylococcus aureus POSITIVE (A) NEGATIVE Final    Comment: (NOTE) The Xpert SA Assay (FDA approved for NASAL specimens in patients 39 years of age and older), is one component of a comprehensive surveillance program. It is not intended to diagnose infection nor to guide or monitor treatment. Performed at [facility], [address] [place], [place] [address]   Aerobic/Anaerobic Culture w Gram Stain (surgical/deep wound)     Status: None (Preliminary result)   Collection Time: 11/09/22  7:40 AM   Specimen: PATH Cytology Misc. fluid; Body Fluid  Result Value Ref Range Status   Specimen Description SYNOVIAL  Final   Special Requests LEFT WRIST SYN FLD  Final   Gram Stain   Final    ABUNDANT WBC PRESENT, PREDOMINANTLY PMN NO ORGANISMS SEEN    Culture   Final    RARE STAPHYLOCOCCUS AUREUS SUSCEPTIBILITIES TO FOLLOW Performed at [facility], [address], [place], [place] [address]    Report Status PENDING  Incomplete     Medications:    amitriptyline  25 mg Oral QHS   enoxaparin (LOVENOX) injection  40 mg Subcutaneous Daily   feeding supplement (GLUCERNA SHAKE)  237 mL Oral TID BM   insulin aspart  0-15 Units Subcutaneous TID WC   insulin aspart  0-5 Units Subcutaneous  QHS   insulin glargine-yfgn  10 Units Subcutaneous BID   metoprolol tartrate  25 mg Oral BID   polyethylene glycol  17 g Oral BID   tamsulosin  0.4 mg Oral QPC supper   Continuous Infusions:   ceFAZolin (ANCEF) IV 2 g (11/10/22 0530)   diltiazem (CARDIZEM) infusion  5 mg/hr (11/08/22 2000)      LOS: 5 days   Marinda Elk  [facility]  11/10/2022, 9:56 AM

## 2022-11-11 ENCOUNTER — Other Ambulatory Visit: Payer: Self-pay

## 2022-11-11 DIAGNOSIS — M00061 Staphylococcal arthritis, right knee: Secondary | ICD-10-CM

## 2022-11-11 DIAGNOSIS — M25432 Effusion, left wrist: Secondary | ICD-10-CM | POA: Diagnosis not present

## 2022-11-11 DIAGNOSIS — R7881 Bacteremia: Secondary | ICD-10-CM | POA: Diagnosis not present

## 2022-11-11 DIAGNOSIS — R55 Syncope and collapse: Secondary | ICD-10-CM | POA: Diagnosis not present

## 2022-11-11 DIAGNOSIS — B9689 Other specified bacterial agents as the cause of diseases classified elsewhere: Secondary | ICD-10-CM

## 2022-11-11 DIAGNOSIS — T847XXD Infection and inflammatory reaction due to other internal orthopedic prosthetic devices, implants and grafts, subsequent encounter: Secondary | ICD-10-CM

## 2022-11-11 DIAGNOSIS — M00032 Staphylococcal arthritis, left wrist: Secondary | ICD-10-CM | POA: Diagnosis not present

## 2022-11-11 DIAGNOSIS — M71161 Other infective bursitis, right knee: Secondary | ICD-10-CM

## 2022-11-11 DIAGNOSIS — S60421A Blister (nonthermal) of left index finger, initial encounter: Secondary | ICD-10-CM | POA: Diagnosis not present

## 2022-11-11 DIAGNOSIS — M25462 Effusion, left knee: Secondary | ICD-10-CM | POA: Diagnosis not present

## 2022-11-11 DIAGNOSIS — B9561 Methicillin susceptible Staphylococcus aureus infection as the cause of diseases classified elsewhere: Secondary | ICD-10-CM | POA: Diagnosis not present

## 2022-11-11 DIAGNOSIS — M4642 Discitis, unspecified, cervical region: Secondary | ICD-10-CM

## 2022-11-11 LAB — GLUCOSE, CAPILLARY
Glucose-Capillary: 130 mg/dL — ABNORMAL HIGH (ref 70–99)
Glucose-Capillary: 102 mg/dL — ABNORMAL HIGH (ref 70–99)
Glucose-Capillary: 192 mg/dL — ABNORMAL HIGH (ref 70–99)
Glucose-Capillary: 220 mg/dL — ABNORMAL HIGH (ref 70–99)

## 2022-11-11 LAB — BODY FLUID CULTURE W GRAM STAIN: Culture: NO GROWTH

## 2022-11-11 MED ORDER — COLCHICINE 0.6 MG PO TABS
0.6000 mg | ORAL_TABLET | Freq: Two times a day (BID) | ORAL | Status: DC
  Administered 2022-11-11 – 2022-11-14 (×7): 0.6 mg via ORAL
  Filled 2022-11-11 (×7): qty 1

## 2022-11-11 NOTE — Plan of Care (Signed)
  Problem: Health Behavior/Discharge Planning: Goal: Ability to manage health-related needs will improve Outcome: Progressing   Problem: Clinical Measurements: Goal: Ability to maintain clinical measurements within normal limits will improve Outcome: Progressing Goal: Respiratory complications will improve Outcome: Progressing Goal: Cardiovascular complication will be avoided Outcome: Progressing   Problem: Nutrition: Goal: Adequate nutrition will be maintained Outcome: Progressing   Problem: Pain Managment: Goal: General experience of comfort will improve Outcome: Progressing   Problem: Safety: Goal: Ability to remain free from injury will improve Outcome: Progressing   Problem: Metabolic: Goal: Ability to maintain appropriate glucose levels will improve Outcome: Progressing   Problem: Tissue Perfusion: Goal: Adequacy of tissue perfusion will improve Outcome: Progressing

## 2022-11-11 NOTE — Progress Notes (Signed)
Pharmacy Antibiotic Note  Michael Bender is a 77 y.o. male with MSSA bacteremia and left wrist septic arthritis. Pharmacy has been consulted for cefazolin dosing. ID following and may need antibiotics for 6 weeks  -WBC= 9.7, afebrile, SCr= 1.45, CrCl ~ 45 -s/p I&D 10/9   Plan: Cefazolin 2 g IV q8h Monitor renal function and adjust dosing if CrCl <30 ml/min Monitor clinical progress and cultures   Height: 5\' 11"  (180.3 cm) Weight: 61.2 kg (134 lb 14.7 oz) IBW/kg (Calculated) : 75.3  Temp (24hrs), Avg:98.7 F (37.1 C), Min:98.3 F (36.8 C), Max:99.1 F (37.3 C)  Recent Labs  Lab 11/05/22 2035 11/06/22 0903 11/07/22 0608 11/08/22 0732 11/09/22 0437 11/09/22 1455  WBC 9.3 9.0 7.0  --  9.7  --   CREATININE 1.87* 1.70* 1.60* 1.32* 1.27* 1.45*    Estimated Creatinine Clearance: 36.9 mL/min (A) (by C-G formula based on SCr of 1.45 mg/dL (H)).    Allergies  Allergen Reactions   Nubain [Nalbuphine Hcl]     Muscle contraction    Antimicrobials this admission: Ceftriaxone 10/6 x1 Cefazolin 10/7 >>   Dose adjustments this admission:   Microbiology results: 10/6 BCx: MSSA 10/6 MRSA PCR: neg 10/6 Resp panel: neg 10/7 blood x2-ngtd 10/7 blood x2- ngtd 10/9 wrist fluid- MSSA  Thank you for involving pharmacy in this patient's care.  Harland German, PharmD Clinical Pharmacist **Pharmacist phone directory can now be found on [email or web address] ([email or web address]).  Listed under [facility].

## 2022-11-11 NOTE — Progress Notes (Signed)
Subjective:   No new complaints    Antibiotics:  Anti-infectives (From admission, onward)    Start     Dose/Rate Route Frequency Ordered Stop   11/07/22 0100  ceFAZolin (ANCEF) IVPB 2g/100 mL premix        2 g 200 mL/hr over 30 Minutes Intravenous Every 8 hours 11/06/22 1540     11/06/22 0100  cefTRIAXone (ROCEPHIN) 2 g in sodium chloride 0.9 % 100 mL IVPB  Status:  Discontinued        2 g 200 mL/hr over 30 Minutes Intravenous Daily at bedtime 11/06/22 0041 11/06/22 1513       Medications: Scheduled Meds:  amitriptyline  25 mg Oral QHS   Chlorhexidine Gluconate Cloth  6 each Topical Q0600   colchicine  0.6 mg Oral BID   enoxaparin (LOVENOX) injection  40 mg Subcutaneous Daily   feeding supplement (GLUCERNA SHAKE)  237 mL Oral TID BM   insulin aspart  0-15 Units Subcutaneous TID WC   insulin aspart  0-5 Units Subcutaneous QHS   insulin glargine-yfgn  5 Units Subcutaneous BID   metoprolol tartrate  25 mg Oral BID   mupirocin ointment  1 Application Nasal BID   tamsulosin  0.4 mg Oral QPC supper   Continuous Infusions:   ceFAZolin (ANCEF) IV 2 g (11/11/22 0530)   PRN Meds:.acetaminophen, melatonin, oxyCODONE, polyethylene glycol, prochlorperazine, QUEtiapine    Objective: Weight change:   Intake/Output Summary (Last 24 hours) at 11/11/2022 1758 Last data filed at 11/11/2022 1620 Gross per 24 hour  Intake 237 ml  Output 2750 ml  Net -2513 ml   Blood pressure 137/73, pulse 93, temperature 98.3 F (36.8 C), temperature source Oral, resp. rate 18, height 5\' 11"  (1.803 m), weight 61.2 kg, SpO2 99%. Temp:  [98.3 F (36.8 C)-99.1 F (37.3 C)] 98.3 F (36.8 C) (10/11 1601) Pulse Rate:  [77-99] 93 (10/11 1601) Resp:  [16-19] 18 (10/11 1601) BP: (135-184)/(65-92) 137/73 (10/11 1601) SpO2:  [95 %-100 %] 99 % (10/11 1601)  Physical Exam: Physical Exam Constitutional:      Appearance: He is well-developed.  HENT:     Head: Normocephalic and  atraumatic.  Eyes:     Extraocular Movements: Extraocular movements intact.     Conjunctiva/sclera: Conjunctivae normal.  Cardiovascular:     Rate and Rhythm: Normal rate. Rhythm irregular.  Pulmonary:     Effort: No respiratory distress.     Breath sounds: No wheezing.  Abdominal:     General: There is no distension.     Palpations: Abdomen is soft.  Musculoskeletal:     Right wrist: Swelling, effusion and tenderness present. Decreased range of motion.     Cervical back: Normal range of motion and neck supple.  Skin:    Findings: No erythema.  Neurological:     General: No focal deficit present.     Mental Status: He is alert and oriented to person, place, and time.  Psychiatric:        Mood and Affect: Mood normal.        Behavior: Behavior normal.        Thought Content: Thought content normal.        Judgment: Judgment normal.     Left wrist dressed   Finger unchanged  CBC:    BMET Recent Labs    11/09/22 0437 11/09/22 1455  NA 135 135  K 4.0 5.0  CL 102 99  CO2 24 24  GLUCOSE 137* 432*  BUN 20 24*  CREATININE 1.27* 1.45*  CALCIUM 8.5* 8.0*     Liver Panel  No results for input(s): "PROT", "ALBUMIN", "AST", "ALT", "ALKPHOS", "BILITOT", "BILIDIR", "IBILI" in the last 72 hours.      Sedimentation Rate No results for input(s): "ESRSEDRATE" in the last 72 hours. C-Reactive Protein No results for input(s): "CRP" in the last 72 hours.  Micro Results: Recent Results (from the past 720 hour(s))  Culture, blood (Routine X 2) w Reflex to ID Panel     Status: Abnormal   Collection Time: 11/06/22  1:13 AM   Specimen: BLOOD RIGHT ARM  Result Value Ref Range Status   Specimen Description BLOOD RIGHT ARM  Final   Special Requests   Final    BOTTLES DRAWN AEROBIC AND ANAEROBIC Blood Culture results may not be optimal due to an excessive volume of blood received in culture bottles   Culture  Setup Time   Final    GRAM POSITIVE COCCI IN BOTH AEROBIC AND  ANAEROBIC BOTTLES Organism ID to follow CRITICAL RESULT CALLED TO, READ BACK BY AND VERIFIED WITH: PHARMD JESSICA MILLEN [id number] BY Berline Chough, [place] Performed at [facility], [address] [place], [place] [address]    Culture STAPHYLOCOCCUS AUREUS (A)  Final   Report Status 11/08/2022 FINAL  Final   Organism ID, Bacteria STAPHYLOCOCCUS AUREUS  Final      Susceptibility   Staphylococcus aureus - MIC*    CIPROFLOXACIN <=0.5 SENSITIVE Sensitive     ERYTHROMYCIN <=0.25 SENSITIVE Sensitive     GENTAMICIN <=0.5 SENSITIVE Sensitive     OXACILLIN 0.5 SENSITIVE Sensitive     TETRACYCLINE <=1 SENSITIVE Sensitive     VANCOMYCIN <=0.5 SENSITIVE Sensitive     TRIMETH/SULFA <=10 SENSITIVE Sensitive     CLINDAMYCIN <=0.25 SENSITIVE Sensitive     RIFAMPIN <=0.5 SENSITIVE Sensitive     Inducible Clindamycin NEGATIVE Sensitive     LINEZOLID 1 SENSITIVE Sensitive     * STAPHYLOCOCCUS AUREUS  Blood Culture ID Panel (Reflexed)     Status: Abnormal   Collection Time: 11/06/22  1:13 AM  Result Value Ref Range Status   Enterococcus faecalis NOT DETECTED NOT DETECTED Final   Enterococcus Faecium NOT DETECTED NOT DETECTED Final   Listeria monocytogenes NOT DETECTED NOT DETECTED Final   Staphylococcus species DETECTED (A) NOT DETECTED Final    Comment: CRITICAL RESULT CALLED TO, READ BACK BY AND VERIFIED WITH: PHARMD JESSICA MILLEN [phone] 1429 BY J RAZZAK, MT    Staphylococcus aureus (BCID) DETECTED (A) NOT DETECTED Final    Comment: CRITICAL RESULT CALLED TO, READ BACK BY AND VERIFIED WITH: PHARMD JESSICA MILLEN [id number] 1429 BY J RAZZAK, MT    Staphylococcus epidermidis NOT DETECTED NOT DETECTED Final   Staphylococcus lugdunensis NOT DETECTED NOT DETECTED Final   Streptococcus species NOT DETECTED NOT DETECTED Final   Streptococcus agalactiae NOT DETECTED NOT DETECTED Final   Streptococcus pneumoniae NOT DETECTED NOT DETECTED Final   Streptococcus pyogenes NOT DETECTED NOT DETECTED  Final   A.calcoaceticus-baumannii NOT DETECTED NOT DETECTED Final   Bacteroides fragilis NOT DETECTED NOT DETECTED Final   Enterobacterales NOT DETECTED NOT DETECTED Final   Enterobacter cloacae complex NOT DETECTED NOT DETECTED Final   Escherichia coli NOT DETECTED NOT DETECTED Final   Klebsiella aerogenes NOT DETECTED NOT DETECTED Final   Klebsiella oxytoca NOT DETECTED NOT DETECTED Final   Klebsiella pneumoniae NOT DETECTED NOT DETECTED Final   Proteus species NOT DETECTED NOT  DETECTED Final   Salmonella species NOT DETECTED NOT DETECTED Final   Serratia marcescens NOT DETECTED NOT DETECTED Final   Haemophilus influenzae NOT DETECTED NOT DETECTED Final   Neisseria meningitidis NOT DETECTED NOT DETECTED Final   Pseudomonas aeruginosa NOT DETECTED NOT DETECTED Final   Stenotrophomonas maltophilia NOT DETECTED NOT DETECTED Final   Candida albicans NOT DETECTED NOT DETECTED Final   Candida auris NOT DETECTED NOT DETECTED Final   Candida glabrata NOT DETECTED NOT DETECTED Final   Candida krusei NOT DETECTED NOT DETECTED Final   Candida parapsilosis NOT DETECTED NOT DETECTED Final   Candida tropicalis NOT DETECTED NOT DETECTED Final   Cryptococcus neoformans/gattii NOT DETECTED NOT DETECTED Final   Meth resistant mecA/C and MREJ NOT DETECTED NOT DETECTED Final    Comment: Performed at [facility], [address] [place], [place] [address]  Culture, blood (Routine X 2) w Reflex to ID Panel     Status: Abnormal   Collection Time: 11/06/22  1:14 AM   Specimen: BLOOD RIGHT ARM  Result Value Ref Range Status   Specimen Description BLOOD RIGHT ARM  Final   Special Requests   Final    BOTTLES DRAWN AEROBIC AND ANAEROBIC Blood Culture results may not be optimal due to an excessive volume of blood received in culture bottles   Culture  Setup Time   Final    GRAM POSITIVE COCCI IN BOTH AEROBIC AND ANAEROBIC BOTTLES    Culture (A)  Final    STAPHYLOCOCCUS AUREUS SUSCEPTIBILITIES  PERFORMED ON PREVIOUS CULTURE WITHIN THE LAST 5 DAYS. Performed at [facility], [address] [place], [place] [address]    Report Status 11/08/2022 FINAL  Final  MRSA Next Gen by PCR, Nasal     Status: None   Collection Time: 11/06/22  1:29 AM   Specimen: Nasal Mucosa; Nasal Swab  Result Value Ref Range Status   MRSA by PCR Next Gen NOT DETECTED NOT DETECTED Final    Comment: (NOTE) The GeneXpert MRSA Assay (FDA approved for NASAL specimens only), is one component of a comprehensive MRSA colonization surveillance program. It is not intended to diagnose MRSA infection nor to guide or monitor treatment for MRSA infections. Test performance is not FDA approved in patients less than 59 years old. Performed at [facility], [address], [place], [place] [address]   SARS Coronavirus 2 by RT PCR (hospital order, performed in [facility] hospital lab) *cepheid single result test* Anterior Nasal Swab     Status: None   Collection Time: 11/06/22  7:33 AM   Specimen: Anterior Nasal Swab  Result Value Ref Range Status   SARS Coronavirus 2 by RT PCR NEGATIVE NEGATIVE Final    Comment: Performed at [facility], [address] [place], [place] [address]  Respiratory (~20 pathogens) panel by PCR     Status: None   Collection Time: 11/06/22  7:34 AM   Specimen: Nasopharyngeal Swab; Respiratory  Result Value Ref Range Status   Adenovirus NOT DETECTED NOT DETECTED Final   Coronavirus 229E NOT DETECTED NOT DETECTED Final    Comment: (NOTE) The Coronavirus on the Respiratory Panel, DOES NOT test for the novel  Coronavirus (2019 nCoV)    Coronavirus HKU1 NOT DETECTED NOT DETECTED Final   Coronavirus NL63 NOT DETECTED NOT DETECTED Final   Coronavirus OC43 NOT DETECTED NOT DETECTED Final   Metapneumovirus NOT DETECTED NOT DETECTED Final   Rhinovirus / Enterovirus NOT DETECTED NOT DETECTED  Final   Influenza A NOT DETECTED NOT DETECTED Final   Influenza B NOT DETECTED NOT  DETECTED Final   Parainfluenza Virus 1 NOT DETECTED NOT DETECTED Final   Parainfluenza Virus 2 NOT DETECTED NOT DETECTED Final   Parainfluenza Virus 3 NOT DETECTED NOT DETECTED Final   Parainfluenza Virus 4 NOT DETECTED NOT DETECTED Final   Respiratory Syncytial Virus NOT DETECTED NOT DETECTED Final   Bordetella pertussis NOT DETECTED NOT DETECTED Final   Bordetella Parapertussis NOT DETECTED NOT DETECTED Final   Chlamydophila pneumoniae NOT DETECTED NOT DETECTED Final   Mycoplasma pneumoniae NOT DETECTED NOT DETECTED Final    Comment: Performed at [facility], [address] [place], [place] [address]  Culture, blood (Routine X 2) w Reflex to ID Panel     Status: None (Preliminary result)   Collection Time: 11/07/22  6:08 AM   Specimen: BLOOD RIGHT ARM  Result Value Ref Range Status   Specimen Description BLOOD RIGHT ARM  Final   Special Requests   Final    BOTTLES DRAWN AEROBIC AND ANAEROBIC Blood Culture adequate volume   Culture   Final    NO GROWTH 4 DAYS Performed at [facility], [address], [place], [place] [address]    Report Status PENDING  Incomplete  Culture, blood (Routine X 2) w Reflex to ID Panel     Status: None (Preliminary result)   Collection Time: 11/07/22  6:08 AM   Specimen: BLOOD RIGHT HAND  Result Value Ref Range Status   Specimen Description BLOOD RIGHT HAND  Final   Special Requests   Final    BOTTLES DRAWN AEROBIC AND ANAEROBIC Blood Culture results may not be optimal due to an excessive volume of blood received in culture bottles   Culture   Final    NO GROWTH 4 DAYS Performed at [facility], [address], [place], [place] [address]    Report Status PENDING  Incomplete  Culture, blood (Routine X 2) w Reflex to ID Panel     Status: None (Preliminary result)   Collection Time: 11/07/22  9:19 PM   Specimen: BLOOD RIGHT HAND  Result Value Ref Range Status   Specimen Description BLOOD RIGHT HAND  Final   Special Requests    Final    BOTTLES DRAWN AEROBIC AND ANAEROBIC Blood Culture adequate volume   Culture   Final    NO GROWTH 4 DAYS Performed at [facility], [address] [place], [place] [address]    Report Status PENDING  Incomplete  Culture, blood (Routine X 2) w Reflex to ID Panel     Status: None (Preliminary result)   Collection Time: 11/07/22  9:19 PM   Specimen: BLOOD RIGHT HAND  Result Value Ref Range Status   Specimen Description BLOOD RIGHT HAND  Final   Special Requests   Final    BOTTLES DRAWN AEROBIC ONLY Blood Culture results may not be optimal due to an inadequate volume of blood received in culture bottles   Culture   Final    NO GROWTH 4 DAYS Performed at [facility], [address], [place], [place] [address]    Report Status PENDING  Incomplete  Body fluid culture w Gram Stain     Status: None   Collection Time: 11/08/22 12:05 PM   Specimen: Body Fluid  Result Value Ref Range Status   Specimen Description FLUID SYNOVIAL RIGHT KNEE  Final   Special Requests  NONE  Final   Gram Stain   Final    RARE WBC PRESENT, PREDOMINANTLY PMN NO ORGANISMS SEEN    Culture   Final    NO GROWTH 3 DAYS Performed at [facility], [address], [place], [place] [address]    Report Status 11/11/2022 FINAL  Final  Gram stain     Status: None   Collection Time: 11/08/22  6:50 PM   Specimen: Synovium; Body Fluid  Result Value Ref Range Status   Specimen Description SYNOVIAL  Final   Special Requests WRIST  Final   Gram Stain   Final    ABUNDANT WBC PRESENT, PREDOMINANTLY PMN NO ORGANISMS SEEN Performed at [facility], [address] [place], [place] [address]    Report Status 11/08/2022 FINAL  Final  Body fluid culture w Gram Stain     Status: None (Preliminary result)   Collection Time: 11/08/22  7:17 PM   Specimen: Synovium  Result Value Ref Range Status   Specimen Description SYNOVIAL  Final   Special Requests WRIST  Final   Gram Stain   Final     ABUNDANT WBC PRESENT, PREDOMINANTLY PMN NO ORGANISMS SEEN    Culture   Final    RARE STAPHYLOCOCCUS AUREUS SUSCEPTIBILITIES TO FOLLOW Performed at [facility], [address] [place] [address] [place], [place] [address]    Report Status PENDING  Incomplete  Surgical pcr screen     Status: Abnormal   Collection Time: 11/09/22  4:36 AM   Specimen: Nasal Mucosa; Nasal Swab  Result Value Ref Range Status   MRSA, PCR NEGATIVE NEGATIVE Final   Staphylococcus aureus POSITIVE (A) NEGATIVE Final    Comment: (NOTE) The Xpert SA Assay (FDA approved for NASAL specimens in patients 84 years of age and older), is one component of a comprehensive surveillance program. It is not intended to diagnose infection nor to guide or monitor treatment. Performed at [facility], [address], [place], [place] [address]   Fungus Culture With Stain     Status: None (Preliminary result)   Collection Time: 11/09/22  7:40 AM   Specimen: PATH Cytology Misc. fluid; Body Fluid  Result Value Ref Range Status   Fungus Stain Final report  Final    Comment: (NOTE) Performed At: [facility] [address] [place], [place] [id number] Jolene Schimke MD MV:[id number]    Fungus (Mycology) Culture PENDING  Incomplete   Fungal Source SYNOVIAL  Final    Comment: Performed at [facility], [address], [place], [place] [address]  Aerobic/Anaerobic Culture w Gram Stain (surgical/deep wound)     Status: None (Preliminary result)   Collection Time: 11/09/22  7:40 AM   Specimen: PATH Cytology Misc. fluid; Body Fluid  Result Value Ref Range Status   Specimen Description SYNOVIAL  Final   Special Requests LEFT WRIST SYN FLD  Final   Gram Stain   Final    ABUNDANT WBC PRESENT, PREDOMINANTLY PMN NO ORGANISMS SEEN Performed at [facility], [address], [place], [place] [address]    Culture   Final    RARE STAPHYLOCOCCUS AUREUS NO ANAEROBES ISOLATED; CULTURE IN PROGRESS FOR 5 DAYS    Report  Status PENDING  Incomplete   Organism ID, Bacteria STAPHYLOCOCCUS AUREUS  Final      Susceptibility   Staphylococcus aureus - MIC*    CIPROFLOXACIN <=0.5 SENSITIVE Sensitive     ERYTHROMYCIN <=0.25 SENSITIVE Sensitive  GENTAMICIN <=0.5 SENSITIVE Sensitive     OXACILLIN 0.5 SENSITIVE Sensitive     TETRACYCLINE <=1 SENSITIVE Sensitive     VANCOMYCIN 1 SENSITIVE Sensitive     TRIMETH/SULFA <=10 SENSITIVE Sensitive     CLINDAMYCIN <=0.25 SENSITIVE Sensitive     RIFAMPIN <=0.5 SENSITIVE Sensitive     Inducible Clindamycin NEGATIVE Sensitive     LINEZOLID 1 SENSITIVE Sensitive     * RARE STAPHYLOCOCCUS AUREUS  Fungus Culture Result     Status: None   Collection Time: 11/09/22  7:40 AM  Result Value Ref Range Status   Result 1 Comment  Final    Comment: (NOTE) KOH/Calcofluor preparation:  no fungus observed. Performed At: [facility] [address] [place], [place] [id number] Jolene Schimke MD UX:[id number]     Studies/Results: [place] EKG SITE RITE  Result Date: 11/11/2022 If [facility] image not attached, placement could not be confirmed due to current cardiac rhythm.  MR FINGERS RIGHT W WO CONTRAST  Result Date: 11/10/2022 CLINICAL DATA:  Pain and swelling in the right index finger. Bacteremia. EXAM: MRI OF THE RIGHT FINGERS WITHOUT AND WITH CONTRAST TECHNIQUE: Multiplanar, multisequence MR imaging of the fingers of the right hand was performed before and after the administration of intravenous contrast. CONTRAST:  6mL GADAVIST GADOBUTROL 1 MMOL/ML IV SOLN COMPARISON:  Radiographs 11/07/2022 FINDINGS: Bones/Joint/Cartilage The examination concentrates on the index finger, although the other fingers are included on some of the sequences. There are multifocal osteoarthritic changes involving the interphalangeal joints, most advanced at the 2nd DIP joint. There is also arthropathy at the 2nd and 3rd metacarpal phalangeal joints. At the 2nd MCP joint, there are prominent  subchondral cysts, marrow edema and enhancement within the metacarpal head and proximal phalanx. There is increased signal within the distal 2nd phalanx on the T2 weighted and postcontrast images which may be secondary to incomplete fat saturation. No suspicious T1 signal abnormality identified in these areas. There are no large joint effusions. Ligaments The collateral ligaments of the visualized metacarpal phalangeal joints are intact. Muscles and Tendons The flexor and extensor tendons of the visualized fingers are intact. No significant tenosynovitis. Nonspecific edema throughout the musculature within the 1st web space without focal fluid collection or abnormal enhancement. Soft tissues Subcutaneous edema surrounding the 2nd metacarpal phalangeal joint without focal fluid collection or apparent overlying skin ulceration. IMPRESSION: 1. Multifocal osteoarthritic changes throughout the visualized right hand, most advanced at the 2nd MCP joint. 2. Nonspecific marrow edema and enhancement around the 2nd metacarpal phalangeal joint with surrounding soft tissue swelling, suspicious for inflammatory or crystalline arthropathy. This could reflect superimposed septic arthritis and early osteomyelitis. 3. No evidence of soft tissue abscess. Electronically Signed   By: Carey Bullocks M.D.   On: 11/10/2022 11:18   MR CERVICAL SPINE W WO CONTRAST  Result Date: 11/10/2022 CLINICAL DATA:  Initial evaluation for neck pain, infection suspected. EXAM: MRI CERVICAL SPINE WITHOUT AND WITH CONTRAST TECHNIQUE: Multiplanar and multiecho pulse sequences of the cervical spine, to include the craniocervical junction and cervicothoracic junction, were obtained without and with intravenous contrast. CONTRAST:  6mL GADAVIST GADOBUTROL 1 MMOL/ML IV SOLN COMPARISON:  Prior CT from 04/24/2009. FINDINGS: Alignment: Straightening of the normal cervical lordosis. 2-3 mm facet mediated anterolisthesis of C4 on C5, C6 on C7, and C7 on T1.  Vertebrae: Vertebral body height maintained without acute or chronic fracture. Susceptibility artifact related to prior posterior fusion at C2-3. Bone marrow signal intensity within normal limits. No worrisome osseous lesions. Mild  reactive endplate changes with marrow edema present about the C5-6 interspace, greater on the right. Trace prevertebral edema seen about the cervical spine (series 7, image 8). While these findings could be degenerative in nature. The presence of the prevertebral edema raises the possibility for early and/or developing osteomyelitis discitis at C5-6. Minimal reactive marrow edema about the left C4-5 facet felt to be due to benign/sterile facet arthritis. No other evidence for acute infection elsewhere within the cervical spine. Cord: Normal signal and morphology. No epidural collections. No abnormal enhancement. Posterior Fossa, vertebral arteries, paraspinal tissues: Visualized brain and posterior fossa within normal limits. Craniocervical junction normal. Trace prevertebral edema again noted. Paraspinous soft tissues demonstrate no other acute finding. Normal flow voids seen within the vertebral arteries bilaterally. Flattening of the ventral thecal sac. Superimposed right greater than left facet hypertrophy. Moderate spinal stenosis. Severe right worse than left C6 foraminal narrowing. Disc levels: C2-C3: Negative interspace. Prior posterior fusion. No canal or foraminal stenosis. C3-C4: Mild disc bulge with right-sided uncovertebral spurring. Mild facet hypertrophy. No significant spinal stenosis. Moderate right C4 foraminal narrowing. Left neural foramen remains patent. C4-C5: Degenerative intervertebral disc space narrowing with diffuse disc bulge and bilateral uncovertebral spurring. Mild bilateral facet and ligament flavum hypertrophy. Moderate spinal stenosis. Severe left with moderate right C5 foraminal narrowing. C5-C6: Degenerative intervertebral disc space narrowing with  circumferential disc osteophyte complex, slightly asymmetric to the right. Flattening of the ventral thecal sac. Superimposed right greater than left facet hypertrophy. Moderate spinal stenosis. Severe right worse than left C6 foraminal narrowing. C6-C7: Mild disc bulge with uncovertebral spurring. Superimposed small right paracentral to foraminal disc protrusion (series 8, image 29). Moderate left-sided facet arthrosis. No significant spinal stenosis. Foramina remain patent. C7-T1: Anterolisthesis. Mild disc bulge. Moderate bilateral facet arthrosis. No spinal stenosis. Foramina remain patent. IMPRESSION: 1. Mild reactive endplate changes with marrow edema about the C5-6 interspace, with trace prevertebral edema. While these findings could be degenerative in nature, the presence of the prevertebral edema raises the possibility for early and/or developing infection at C5-6. Correlation with laboratory values and symptomatology recommended. Additionally, a short interval follow-up MRI to evaluate for potential evolutionary changes may be helpful for further evaluation as warranted. 2. No other evidence for acute infection elsewhere within the cervical spine. 3. Multilevel cervical spondylosis with resultant moderate spinal stenosis at C4-5 and C5-6. Moderate to severe bilateral C4 through C6 foraminal narrowing as above. Electronically Signed   By: Rise Mu M.D.   On: 11/10/2022 06:06      Assessment/Plan:  INTERVAL HISTORY:   Case discussed with Dr. Hulda Humphrey from Orthopedics  Principal Problem:   MSSA bacteremia Active Problems:   Syncope   Septic infrapatellar bursitis of right knee   Staphylococcal arthritis of left wrist (HCC)   Infected blister of left index finger   Staphylococcal arthritis of right knee (HCC)   Neck pain   Finger pain, left   Septic arthritis of wrist, left (HCC)   Cervical discitis   Hardware complicating wound infection (HCC)    Michael Bender is a 77 y.o.  male with history of cervical lumbar spine hardware and recent admission for falls weakness and rhabdomyolysis who was discharged to skilled nursing facility but then brought back with sepsis due to MSSA bacteremia.  He had fallen but also headaches was at pain in his left wrist as well as pain in his right knee and index finger.  He has been proven to have a left septic wrist and now  status post I&D.  I am confident his knee is also infected.  While the white blood cell count of the knee of 8500 in a person coming in de novo without antibiotics would not be compelling for a septic knee this man was on IV antibiotics since the 5th  I do not think we need to pursue TEE   MRI of the hand shows multifocal osteoarthritic changes throughout the second hand most advanced at the second MCP joint with nonspecific marrow edema enhancement around the second metacarpal phalangeal joint with surrounding soft tissue swelling concerning for inflammatory arthropathy versus septic arthritis  MRI of the C-spine does show some mild reactive endplate changes and marrow edema from C5-C6 which could be degenerative or could be early osteomyelitis discitis.  Orthopedic hand surgery closely following him and they do not feel that he has a likely infection in his right hand but I think he WILL NEED help from orthopedic surgery as well with regards to his knee.  I discussed the case with Dr. Hulda Humphrey today and they will plan on re-examining the patient and considering possible repeat arthrocentesis for cell count and differential  We will continue cefazolin.  I am ok place PICC tomorrow.  I have personally spent 50 minutes involved in face-to-face and non-face-to-face activities for this patient on the day of the visit. Professional time spent includes the following activities: Preparing to see the patient (review of tests), Obtaining and/or reviewing separately obtained history (admission/discharge record), Performing a  medically appropriate examination and/or evaluation , Ordering medications/tests/procedures, referring and communicating with other health care professionals, Documenting clinical information in the EMR, Independently interpreting results (not separately reported), Communicating results to the patient/family/caregiver, Counseling and educating the patient/family/caregiver and Care coordination (not separately reported).   My partner Dr. Luciana Axe is available this weekend for questions and will followup the cultures.     LOS: 6 days   Acey Lav 11/11/2022, 5:58 PM

## 2022-11-11 NOTE — Progress Notes (Addendum)
[facility] PROGRESS NOTE    Progress Note  Michael Bender  ZOX:[id number] DOB: Aug 05, 1945 DOA: 11/05/2022 PCP: Lula Olszewski, MD     Brief Narrative:   Michael Bender is an 77 y.o. male past medical history significant peripheral vascular disease, essential hypertension, history of orthostatic hypotension recently discharged from the hospital on 11/02/2022 due to multiple falls, traumatic rhabdomyolysis acute kidney injury acute urinary retention due to bladder outlet obstruction and BPH, was offered skilled nursing facility but declined went home with PT came in with recurrent syncopal episode associated with generalized weakness found to to have MSSA bacteremia and septic arthritis. ID was consulted recommended IV cefazolin surveillance blood culture on 11/07/2022 have been negative till date.   Assessment/Plan:   Septic arthritis/MSSA bacteremia: Continue IV cefazolin surveillance blood cultures on 11/07/2022 have been negative till date. MRI of the wrist  concerning for septic arthritis, Due to concern of septic arthritis orthopedic surgery recommended left wrist capsulotomy and washout on 11/09/2022. Infectious disease was consulted, MRI of the C-spine does show possible discitis/early infection of C5 cc disc. ID recommended to reconsult Ortho for right knee, to rule out septic knee arthritis. Relates his pain is controlled. PT OT evaluated the patient will need skilled nursing facility, [facility] consulted. Awaiting ID further recommendations. Will need a PICC line.  Syncope due to orthostatic vitals: IV fluids were stopped probably infection contributing to it. PT OT has been  evaluating the patient, will need skilled nursing facility.  Right hand pain: Orthopedic evaluated the MRI no clinical correlation with active infection there is inflammatory changes. Start colchicine, Ortho consult about crystal inflammatory changes  Elevated troponins: Likely demand  ischemia twelve-lead EKG showed no evidence of ischemia.  Newly diagnosed A-fib with RVR: With a chads Vascor greater than 4 started on IV diltiazem now switched to oral beta-blockers. Not a candidate for blood thinners due to recurrent falls and orthostasis, can be evaluated as an outpatient.  Hyperglycemia iatrogenic: With an A1c of 6.5 he was given diltiazem which has D5 and dexamethasone his blood glucose 1-400 had to be placed on long-acting sample sliding scale. His blood glucose is improved. Will decrease long-acting insulin continue sliding scale.  Hypokalemia/hypomagnesemia: Repleted, try to potassium greater than 4 magnesium greater than 2.  Acute kidney injury: Suspect prerenal azotemia. He was continued on Flomax started on IV fluids his creatinine returned to baseline.  Physical debility: PT OT is was consulted, will need to go to skilled nursing facility.   DVT prophylaxis: lovenox Family Communication:none Status is: Inpatient Remains inpatient appropriate because: Septic arthritis/MSSA bacteremia    Code Status:     Code Status Orders  (From admission, onward)           Start     Ordered   11/08/22 1420  Do not attempt resuscitation (DNR)- Limited -Do Not Intubate (DNI)  (Code Status)  Continuous       Question Answer Comment  If pulseless and not breathing No CPR or chest compressions.   In Pre-Arrest Conditions (Patient Is Breathing and Has A Pulse) Do not intubate. Provide all appropriate non-invasive medical interventions. Avoid ICU transfer unless indicated or required.   Consent: Discussion documented in EHR or advanced directives reviewed      11/08/22 1419           Code Status History     Date Active Date Inactive Code Status Order ID Comments User Context   11/05/2022 2258 11/08/2022 1419 Full  Code [id number]  Darlin Drop, DO ED   10/28/2022 0324 11/02/2022 1701 Full Code [id number]  Dolly Rias, MD Inpatient   07/17/2019 2250  07/21/2019 1754 DNR [id number]  John Giovanni, MD Inpatient   07/17/2019 2133 07/17/2019 2250 Full Code [id number]  John Giovanni, MD ED         IV Access:   Peripheral IV   Procedures and diagnostic studies:   MR FINGERS RIGHT W WO CONTRAST  Result Date: 11/10/2022 CLINICAL DATA:  Pain and swelling in the right index finger. Bacteremia. EXAM: MRI OF THE RIGHT FINGERS WITHOUT AND WITH CONTRAST TECHNIQUE: Multiplanar, multisequence MR imaging of the fingers of the right hand was performed before and after the administration of intravenous contrast. CONTRAST:  6mL GADAVIST GADOBUTROL 1 MMOL/ML IV SOLN COMPARISON:  Radiographs 11/07/2022 FINDINGS: Bones/Joint/Cartilage The examination concentrates on the index finger, although the other fingers are included on some of the sequences. There are multifocal osteoarthritic changes involving the interphalangeal joints, most advanced at the 2nd DIP joint. There is also arthropathy at the 2nd and 3rd metacarpal phalangeal joints. At the 2nd MCP joint, there are prominent subchondral cysts, marrow edema and enhancement within the metacarpal head and proximal phalanx. There is increased signal within the distal 2nd phalanx on the T2 weighted and postcontrast images which may be secondary to incomplete fat saturation. No suspicious T1 signal abnormality identified in these areas. There are no large joint effusions. Ligaments The collateral ligaments of the visualized metacarpal phalangeal joints are intact. Muscles and Tendons The flexor and extensor tendons of the visualized fingers are intact. No significant tenosynovitis. Nonspecific edema throughout the musculature within the 1st web space without focal fluid collection or abnormal enhancement. Soft tissues Subcutaneous edema surrounding the 2nd metacarpal phalangeal joint without focal fluid collection or apparent overlying skin ulceration. IMPRESSION: 1. Multifocal osteoarthritic changes throughout  the visualized right hand, most advanced at the 2nd MCP joint. 2. Nonspecific marrow edema and enhancement around the 2nd metacarpal phalangeal joint with surrounding soft tissue swelling, suspicious for inflammatory or crystalline arthropathy. This could reflect superimposed septic arthritis and early osteomyelitis. 3. No evidence of soft tissue abscess. Electronically Signed   By: Carey Bullocks M.D.   On: 11/10/2022 11:18   MR CERVICAL SPINE W WO CONTRAST  Result Date: 11/10/2022 CLINICAL DATA:  Initial evaluation for neck pain, infection suspected. EXAM: MRI CERVICAL SPINE WITHOUT AND WITH CONTRAST TECHNIQUE: Multiplanar and multiecho pulse sequences of the cervical spine, to include the craniocervical junction and cervicothoracic junction, were obtained without and with intravenous contrast. CONTRAST:  6mL GADAVIST GADOBUTROL 1 MMOL/ML IV SOLN COMPARISON:  Prior CT from 04/24/2009. FINDINGS: Alignment: Straightening of the normal cervical lordosis. 2-3 mm facet mediated anterolisthesis of C4 on C5, C6 on C7, and C7 on T1. Vertebrae: Vertebral body height maintained without acute or chronic fracture. Susceptibility artifact related to prior posterior fusion at C2-3. Bone marrow signal intensity within normal limits. No worrisome osseous lesions. Mild reactive endplate changes with marrow edema present about the C5-6 interspace, greater on the right. Trace prevertebral edema seen about the cervical spine (series 7, image 8). While these findings could be degenerative in nature. The presence of the prevertebral edema raises the possibility for early and/or developing osteomyelitis discitis at C5-6. Minimal reactive marrow edema about the left C4-5 facet felt to be due to benign/sterile facet arthritis. No other evidence for acute infection elsewhere within the cervical spine. Cord: Normal signal and morphology. No epidural collections. No abnormal enhancement. Posterior  Fossa, vertebral arteries, paraspinal  tissues: Visualized brain and posterior fossa within normal limits. Craniocervical junction normal. Trace prevertebral edema again noted. Paraspinous soft tissues demonstrate no other acute finding. Normal flow voids seen within the vertebral arteries bilaterally. Flattening of the ventral thecal sac. Superimposed right greater than left facet hypertrophy. Moderate spinal stenosis. Severe right worse than left C6 foraminal narrowing. Disc levels: C2-C3: Negative interspace. Prior posterior fusion. No canal or foraminal stenosis. C3-C4: Mild disc bulge with right-sided uncovertebral spurring. Mild facet hypertrophy. No significant spinal stenosis. Moderate right C4 foraminal narrowing. Left neural foramen remains patent. C4-C5: Degenerative intervertebral disc space narrowing with diffuse disc bulge and bilateral uncovertebral spurring. Mild bilateral facet and ligament flavum hypertrophy. Moderate spinal stenosis. Severe left with moderate right C5 foraminal narrowing. C5-C6: Degenerative intervertebral disc space narrowing with circumferential disc osteophyte complex, slightly asymmetric to the right. Flattening of the ventral thecal sac. Superimposed right greater than left facet hypertrophy. Moderate spinal stenosis. Severe right worse than left C6 foraminal narrowing. C6-C7: Mild disc bulge with uncovertebral spurring. Superimposed small right paracentral to foraminal disc protrusion (series 8, image 29). Moderate left-sided facet arthrosis. No significant spinal stenosis. Foramina remain patent. C7-T1: Anterolisthesis. Mild disc bulge. Moderate bilateral facet arthrosis. No spinal stenosis. Foramina remain patent. IMPRESSION: 1. Mild reactive endplate changes with marrow edema about the C5-6 interspace, with trace prevertebral edema. While these findings could be degenerative in nature, the presence of the prevertebral edema raises the possibility for early and/or developing infection at C5-6. Correlation with  laboratory values and symptomatology recommended. Additionally, a short interval follow-up MRI to evaluate for potential evolutionary changes may be helpful for further evaluation as warranted. 2. No other evidence for acute infection elsewhere within the cervical spine. 3. Multilevel cervical spondylosis with resultant moderate spinal stenosis at C4-5 and C5-6. Moderate to severe bilateral C4 through C6 foraminal narrowing as above. Electronically Signed   By: Rise Mu M.D.   On: 11/10/2022 06:06     Medical Consultants:   None.   Subjective:    [name] right hand pain is improved.  Objective:    Vitals:   11/11/22 0336 11/11/22 0343 11/11/22 0638 11/11/22 0825  BP:  (!) 154/65  (!) 184/78  Pulse: 85 84 92 94  Resp:  18  16  Temp:  98.3 F (36.8 C)    TempSrc:  Oral    SpO2: 100% 98% 97% 97%  Weight:      Height:       SpO2: 97 %   Intake/Output Summary (Last 24 hours) at 11/11/2022 1038 Last data filed at 11/11/2022 0653 Gross per 24 hour  Intake 237 ml  Output 2900 ml  Net -2663 ml   Filed Weights   11/05/22 1949  Weight: 61.2 kg    Exam: General exam: In no acute distress. Respiratory system: Good air movement and clear to auscultation. Cardiovascular system: S1 & S2 heard, RRR. No JVD. Gastrointestinal system: Abdomen is nondistended, soft and nontender.  Extremities: Left hand wrap and splint Skin: No rashes, lesions or ulcers Psychiatry: Judgement and insight appear normal. Mood & affect appropriate. Data Reviewed:    Labs: Basic Metabolic Panel: Recent Labs  Lab 11/05/22 2035 11/06/22 0903 11/07/22 8295 11/08/22 0732 11/08/22 0849 11/08/22 1726 11/09/22 0437 11/09/22 1455  NA 134* 132* 133* 134*  --   --  135 135  K 2.6* 3.3* 3.0* 2.9*  --   --  4.0 5.0  CL 97* 101 99 95*  --   --  102 99  CO2 21* 21* 24 26  --   --  24 24  GLUCOSE 222* 295* 257* 321*  --  405* 137* 432*  BUN 23 23 21 18   --   --  20 24*  CREATININE  1.87* 1.70* 1.60* 1.32*  --   --  1.27* 1.45*  CALCIUM 8.0* 7.7* 7.9* 8.2*  --   --  8.5* 8.0*  MG 1.6*  --  1.8  --  1.8  --  2.0  --    GFR Estimated Creatinine Clearance: 36.9 mL/min (A) (by C-G formula based on SCr of 1.45 mg/dL (H)). Liver Function Tests: Recent Labs  Lab 11/05/22 2035  AST 28  ALT 22  ALKPHOS 116  BILITOT 0.4  PROT 5.7*  ALBUMIN 2.2*   No results for input(s): "LIPASE", "AMYLASE" in the last 168 hours. No results for input(s): "AMMONIA" in the last 168 hours. Coagulation profile No results for input(s): "INR", "PROTIME" in the last 168 hours. COVID-19 Labs  No results for input(s): "DDIMER", "FERRITIN", "LDH", "CRP" in the last 72 hours.  Lab Results  Component Value Date   SARSCOV2NAA NEGATIVE 11/06/2022   SARSCOV2NAA RESULT: NEGATIVE 09/12/2019   SARSCOV2NAA NEGATIVE 07/17/2019   SARSCOV2NAA RESULT:  NEGATIVE 03/06/2019    CBC: Recent Labs  Lab 11/05/22 2035 11/06/22 0903 11/07/22 0608 11/09/22 0437  WBC 9.3 9.0 7.0 9.7  NEUTROABS 8.6*  --   --   --   HGB 9.3* 9.0* 8.6* 9.5*  HCT 28.4* 26.8* 25.4* 27.9*  MCV 92.5 92.4 90.4 92.1  PLT 146* 134* 125* 161   Cardiac Enzymes: Recent Labs  Lab 11/05/22 2035  CKTOTAL 108   BNP (last 3 results) No results for input(s): "PROBNP" in the last 8760 hours. CBG: Recent Labs  Lab 11/10/22 0816 11/10/22 1239 11/10/22 1658 11/10/22 2120 11/11/22 0824  GLUCAP 131* 139* 286* 183* 130*   D-Dimer: No results for input(s): "DDIMER" in the last 72 hours. Hgb A1c: No results for input(s): "HGBA1C" in the last 72 hours.  Lipid Profile: No results for input(s): "CHOL", "HDL", "LDLCALC", "TRIG", "CHOLHDL", "LDLDIRECT" in the last 72 hours. Thyroid function studies: No results for input(s): "TSH", "T4TOTAL", "T3FREE", "THYROIDAB" in the last 72 hours.  Invalid input(s): "FREET3" Anemia work up: No results for input(s): "VITAMINB12", "FOLATE", "FERRITIN", "TIBC", "IRON", "RETICCTPCT" in the last  72 hours. Sepsis Labs: Recent Labs  Lab 11/05/22 2035 11/06/22 0903 11/07/22 0608 11/09/22 0437  WBC 9.3 9.0 7.0 9.7   Microbiology Recent Results (from the past 240 hour(s))  Culture, blood (Routine X 2) w Reflex to ID Panel     Status: Abnormal   Collection Time: 11/06/22  1:13 AM   Specimen: BLOOD RIGHT ARM  Result Value Ref Range Status   Specimen Description BLOOD RIGHT ARM  Final   Special Requests   Final    BOTTLES DRAWN AEROBIC AND ANAEROBIC Blood Culture results may not be optimal due to an excessive volume of blood received in culture bottles   Culture  Setup Time   Final    GRAM POSITIVE COCCI IN BOTH AEROBIC AND ANAEROBIC BOTTLES Organism ID to follow CRITICAL RESULT CALLED TO, READ BACK BY AND VERIFIED WITH: PHARMD JESSICA MILLEN [id number] 1429 BY Berline Chough, [place] Performed at [facility], [address] [place], [place] [address]    Culture STAPHYLOCOCCUS AUREUS (A)  Final   Report Status 11/08/2022 FINAL  Final   Organism ID, Bacteria STAPHYLOCOCCUS AUREUS  Final  Susceptibility   Staphylococcus aureus - MIC*    CIPROFLOXACIN <=0.5 SENSITIVE Sensitive     ERYTHROMYCIN <=0.25 SENSITIVE Sensitive     GENTAMICIN <=0.5 SENSITIVE Sensitive     OXACILLIN 0.5 SENSITIVE Sensitive     TETRACYCLINE <=1 SENSITIVE Sensitive     VANCOMYCIN <=0.5 SENSITIVE Sensitive     TRIMETH/SULFA <=10 SENSITIVE Sensitive     CLINDAMYCIN <=0.25 SENSITIVE Sensitive     RIFAMPIN <=0.5 SENSITIVE Sensitive     Inducible Clindamycin NEGATIVE Sensitive     LINEZOLID 1 SENSITIVE Sensitive     * STAPHYLOCOCCUS AUREUS  Blood Culture ID Panel (Reflexed)     Status: Abnormal   Collection Time: 11/06/22  1:13 AM  Result Value Ref Range Status   Enterococcus faecalis NOT DETECTED NOT DETECTED Final   Enterococcus Faecium NOT DETECTED NOT DETECTED Final   Listeria monocytogenes NOT DETECTED NOT DETECTED Final   Staphylococcus species DETECTED (A) NOT DETECTED Final    Comment: CRITICAL  RESULT CALLED TO, READ BACK BY AND VERIFIED WITH: PHARMD JESSICA MILLEN [phone] 1429 BY J RAZZAK, MT    Staphylococcus aureus (BCID) DETECTED (A) NOT DETECTED Final    Comment: CRITICAL RESULT CALLED TO, READ BACK BY AND VERIFIED WITH: PHARMD JESSICA MILLEN [id number] 1429 BY J RAZZAK, MT    Staphylococcus epidermidis NOT DETECTED NOT DETECTED Final   Staphylococcus lugdunensis NOT DETECTED NOT DETECTED Final   Streptococcus species NOT DETECTED NOT DETECTED Final   Streptococcus agalactiae NOT DETECTED NOT DETECTED Final   Streptococcus pneumoniae NOT DETECTED NOT DETECTED Final   Streptococcus pyogenes NOT DETECTED NOT DETECTED Final   A.calcoaceticus-baumannii NOT DETECTED NOT DETECTED Final   Bacteroides fragilis NOT DETECTED NOT DETECTED Final   Enterobacterales NOT DETECTED NOT DETECTED Final   Enterobacter cloacae complex NOT DETECTED NOT DETECTED Final   Escherichia coli NOT DETECTED NOT DETECTED Final   Klebsiella aerogenes NOT DETECTED NOT DETECTED Final   Klebsiella oxytoca NOT DETECTED NOT DETECTED Final   Klebsiella pneumoniae NOT DETECTED NOT DETECTED Final   Proteus species NOT DETECTED NOT DETECTED Final   Salmonella species NOT DETECTED NOT DETECTED Final   Serratia marcescens NOT DETECTED NOT DETECTED Final   Haemophilus influenzae NOT DETECTED NOT DETECTED Final   Neisseria meningitidis NOT DETECTED NOT DETECTED Final   Pseudomonas aeruginosa NOT DETECTED NOT DETECTED Final   Stenotrophomonas maltophilia NOT DETECTED NOT DETECTED Final   Candida albicans NOT DETECTED NOT DETECTED Final   Candida auris NOT DETECTED NOT DETECTED Final   Candida glabrata NOT DETECTED NOT DETECTED Final   Candida krusei NOT DETECTED NOT DETECTED Final   Candida parapsilosis NOT DETECTED NOT DETECTED Final   Candida tropicalis NOT DETECTED NOT DETECTED Final   Cryptococcus neoformans/gattii NOT DETECTED NOT DETECTED Final   Meth resistant mecA/C and MREJ NOT DETECTED NOT DETECTED Final     Comment: Performed at [facility], [address] [place], [place] [address]  Culture, blood (Routine X 2) w Reflex to ID Panel     Status: Abnormal   Collection Time: 11/06/22  1:14 AM   Specimen: BLOOD RIGHT ARM  Result Value Ref Range Status   Specimen Description BLOOD RIGHT ARM  Final   Special Requests   Final    BOTTLES DRAWN AEROBIC AND ANAEROBIC Blood Culture results may not be optimal due to an excessive volume of blood received in culture bottles   Culture  Setup Time   Final    GRAM POSITIVE COCCI IN BOTH AEROBIC  AND ANAEROBIC BOTTLES    Culture (A)  Final    STAPHYLOCOCCUS AUREUS SUSCEPTIBILITIES PERFORMED ON PREVIOUS CULTURE WITHIN THE LAST 5 DAYS. Performed at [facility], [address], [place], [place] [address]    Report Status 11/08/2022 FINAL  Final  MRSA Next Gen by PCR, Nasal     Status: None   Collection Time: 11/06/22  1:29 AM   Specimen: Nasal Mucosa; Nasal Swab  Result Value Ref Range Status   MRSA by PCR Next Gen NOT DETECTED NOT DETECTED Final    Comment: (NOTE) The GeneXpert MRSA Assay (FDA approved for NASAL specimens only), is one component of a comprehensive MRSA colonization surveillance program. It is not intended to diagnose MRSA infection nor to guide or monitor treatment for MRSA infections. Test performance is not FDA approved in patients less than 79 years old. Performed at [facility], [address], [place], [place] [address]   SARS Coronavirus 2 by RT PCR (hospital order, performed in [facility] hospital lab) *cepheid single result test* Anterior Nasal Swab     Status: None   Collection Time: 11/06/22  7:33 AM   Specimen: Anterior Nasal Swab  Result Value Ref Range Status   SARS Coronavirus 2 by RT PCR NEGATIVE NEGATIVE Final    Comment: Performed at [facility], [address], [place], [place] [address]  Respiratory (~20 pathogens) panel by PCR     Status: None   Collection Time: 11/06/22  7:34 AM    Specimen: Nasopharyngeal Swab; Respiratory  Result Value Ref Range Status   Adenovirus NOT DETECTED NOT DETECTED Final   Coronavirus 229E NOT DETECTED NOT DETECTED Final    Comment: (NOTE) The Coronavirus on the Respiratory Panel, DOES NOT test for the novel  Coronavirus (2019 nCoV)    Coronavirus HKU1 NOT DETECTED NOT DETECTED Final   Coronavirus NL63 NOT DETECTED NOT DETECTED Final   Coronavirus OC43 NOT DETECTED NOT DETECTED Final   Metapneumovirus NOT DETECTED NOT DETECTED Final   Rhinovirus / Enterovirus NOT DETECTED NOT DETECTED Final   Influenza A NOT DETECTED NOT DETECTED Final   Influenza B NOT DETECTED NOT DETECTED Final   Parainfluenza Virus 1 NOT DETECTED NOT DETECTED Final   Parainfluenza Virus 2 NOT DETECTED NOT DETECTED Final   Parainfluenza Virus 3 NOT DETECTED NOT DETECTED Final   Parainfluenza Virus 4 NOT DETECTED NOT DETECTED Final   Respiratory Syncytial Virus NOT DETECTED NOT DETECTED Final   Bordetella pertussis NOT DETECTED NOT DETECTED Final   Bordetella Parapertussis NOT DETECTED NOT DETECTED Final   Chlamydophila pneumoniae NOT DETECTED NOT DETECTED Final   Mycoplasma pneumoniae NOT DETECTED NOT DETECTED Final    Comment: Performed at [facility], [address] [place], [place] [address]  Culture, blood (Routine X 2) w Reflex to ID Panel     Status: None (Preliminary result)   Collection Time: 11/07/22  6:08 AM   Specimen: BLOOD RIGHT ARM  Result Value Ref Range Status   Specimen Description BLOOD RIGHT ARM  Final   Special Requests   Final    BOTTLES DRAWN AEROBIC AND ANAEROBIC Blood Culture adequate volume   Culture   Final    NO GROWTH 3 DAYS Performed at [facility], [address], [place], [place] [address]    Report Status PENDING  Incomplete  Culture, blood (Routine X 2) w Reflex to ID Panel     Status: None (Preliminary result)   Collection  Time: 11/07/22  6:08 AM   Specimen: BLOOD RIGHT HAND  Result Value Ref Range Status    Specimen Description BLOOD RIGHT HAND  Final   Special Requests   Final    BOTTLES DRAWN AEROBIC AND ANAEROBIC Blood Culture results may not be optimal due to an excessive volume of blood received in culture bottles   Culture   Final    NO GROWTH 3 DAYS Performed at [facility], [address] [place], [place] [address]    Report Status PENDING  Incomplete  Culture, blood (Routine X 2) w Reflex to ID Panel     Status: None (Preliminary result)   Collection Time: 11/07/22  9:19 PM   Specimen: BLOOD RIGHT HAND  Result Value Ref Range Status   Specimen Description BLOOD RIGHT HAND  Final   Special Requests   Final    BOTTLES DRAWN AEROBIC AND ANAEROBIC Blood Culture adequate volume   Culture   Final    NO GROWTH 3 DAYS Performed at [facility], [address], [place], [place] [address]    Report Status PENDING  Incomplete  Culture, blood (Routine X 2) w Reflex to ID Panel     Status: None (Preliminary result)   Collection Time: 11/07/22  9:19 PM   Specimen: BLOOD RIGHT HAND  Result Value Ref Range Status   Specimen Description BLOOD RIGHT HAND  Final   Special Requests   Final    BOTTLES DRAWN AEROBIC ONLY Blood Culture results may not be optimal due to an inadequate volume of blood received in culture bottles   Culture   Final    NO GROWTH 3 DAYS Performed at [facility], [address] [place], [place] [address]    Report Status PENDING  Incomplete  Body fluid culture w Gram Stain     Status: None (Preliminary result)   Collection Time: 11/08/22 12:05 PM   Specimen: Body Fluid  Result Value Ref Range Status   Specimen Description FLUID SYNOVIAL RIGHT KNEE  Final   Special Requests NONE  Final   Gram Stain   Final    RARE WBC PRESENT, PREDOMINANTLY PMN NO ORGANISMS SEEN    Culture   Final    NO GROWTH 2 DAYS Performed at [facility], [address] [place], [place] [address]    Report Status PENDING  Incomplete  Gram stain     Status:  None   Collection Time: 11/08/22  6:50 PM   Specimen: Synovium; Body Fluid  Result Value Ref Range Status   Specimen Description SYNOVIAL  Final   Special Requests WRIST  Final   Gram Stain   Final    ABUNDANT WBC PRESENT, PREDOMINANTLY PMN NO ORGANISMS SEEN Performed at [facility], [address] [place], [place] [address]    Report Status 11/08/2022 FINAL  Final  Body fluid culture w Gram Stain     Status: None (Preliminary result)   Collection Time: 11/08/22  7:17 PM   Specimen: Synovium  Result Value Ref Range Status   Specimen Description SYNOVIAL  Final   Special Requests WRIST  Final   Gram Stain   Final    ABUNDANT WBC PRESENT, PREDOMINANTLY PMN NO ORGANISMS SEEN    Culture   Final    CULTURE REINCUBATED FOR BETTER GROWTH Performed at [facility], [address], [place], [place] [address]    Report Status PENDING  Incomplete  Surgical pcr  screen     Status: Abnormal   Collection Time: 11/09/22  4:36 AM   Specimen: Nasal Mucosa; Nasal Swab  Result Value Ref Range Status   MRSA, PCR NEGATIVE NEGATIVE Final   Staphylococcus aureus POSITIVE (A) NEGATIVE Final    Comment: (NOTE) The Xpert SA Assay (FDA approved for NASAL specimens in patients 32 years of age and older), is one component of a comprehensive surveillance program. It is not intended to diagnose infection nor to guide or monitor treatment. Performed at [facility], [address], [place], [place] [address]   Aerobic/Anaerobic Culture w Gram Stain (surgical/deep wound)     Status: None (Preliminary result)   Collection Time: 11/09/22  7:40 AM   Specimen: PATH Cytology Misc. fluid; Body Fluid  Result Value Ref Range Status   Specimen Description SYNOVIAL  Final   Special Requests LEFT WRIST SYN FLD  Final   Gram Stain   Final    ABUNDANT WBC PRESENT, PREDOMINANTLY PMN NO ORGANISMS SEEN Performed at [facility], [address] [place], [place] [address]    Culture RARE  STAPHYLOCOCCUS AUREUS  Final   Report Status PENDING  Incomplete   Organism ID, Bacteria STAPHYLOCOCCUS AUREUS  Final      Susceptibility   Staphylococcus aureus - MIC*    CIPROFLOXACIN <=0.5 SENSITIVE Sensitive     ERYTHROMYCIN <=0.25 SENSITIVE Sensitive     GENTAMICIN <=0.5 SENSITIVE Sensitive     OXACILLIN 0.5 SENSITIVE Sensitive     TETRACYCLINE <=1 SENSITIVE Sensitive     VANCOMYCIN 1 SENSITIVE Sensitive     TRIMETH/SULFA <=10 SENSITIVE Sensitive     CLINDAMYCIN <=0.25 SENSITIVE Sensitive     RIFAMPIN <=0.5 SENSITIVE Sensitive     Inducible Clindamycin NEGATIVE Sensitive     LINEZOLID 1 SENSITIVE Sensitive     * RARE STAPHYLOCOCCUS AUREUS     Medications:    amitriptyline  25 mg Oral QHS   Chlorhexidine Gluconate Cloth  6 each Topical Q0600   enoxaparin (LOVENOX) injection  40 mg Subcutaneous Daily   feeding supplement (GLUCERNA SHAKE)  237 mL Oral TID BM   insulin aspart  0-15 Units Subcutaneous TID WC   insulin aspart  0-5 Units Subcutaneous QHS   insulin glargine-yfgn  5 Units Subcutaneous BID   metoprolol tartrate  25 mg Oral BID   mupirocin ointment  1 Application Nasal BID   tamsulosin  0.4 mg Oral QPC supper   Continuous Infusions:   ceFAZolin (ANCEF) IV 2 g (11/11/22 0530)      LOS: 6 days   Marinda Elk  [facility]  11/11/2022, 10:38 AM

## 2022-11-11 NOTE — TOC Progression Note (Signed)
Transition of Care [facility]) - Progression Note    Patient Details  Name: NAOKI MIGLIACCIO MRN: [id number] Date of Birth: 1945-04-15  Transition [facility] CM/SW Contact  Baldemar Len[place], [place] Phone Number: 11/11/2022, 1:06 PM  Clinical Narrative:   CSW received insurance authorization for patient, start date is for 10/14. Noting per chart review patient not medically stable today, will plan for discharge Monday if stable at that time. CSW updated [facility], they will have a bed. CSW to follow.    Expected Discharge Plan: Skilled Nursing Facility Barriers to Discharge: Continued Medical Work up  Expected Discharge Plan and Services In-house Referral: Clinical Social Work     Living arrangements for the past 2 months: Single Family Home                                       Social Determinants of Health (SDOH) Interventions SDOH Screenings   Food Insecurity: No Food Insecurity (11/06/2022)  Housing: Low Risk  (11/06/2022)  Transportation Needs: No Transportation Needs (11/06/2022)  Utilities: Not At Risk (11/06/2022)  Alcohol Screen: Low Risk  (07/14/2021)  Depression (PHQ2-9): Low Risk  (10/13/2022)  Financial Resource Strain: Low Risk  (10/13/2022)  Physical Activity: Insufficiently Active (10/13/2022)  Social Connections: Socially Isolated (10/13/2022)  Stress: No Stress Concern Present (10/13/2022)  Tobacco Use: High Risk (11/09/2022)  Health Literacy: Inadequate Health Literacy (10/13/2022)    Readmission Risk Interventions     No data to display

## 2022-11-11 NOTE — Progress Notes (Signed)
Mobility Specialist Progress Note:    11/11/22 1217  Mobility  Activity Transferred from bed to chair  Level of Assistance Minimal assist, patient does 75% or more  Assistive Device Other (Comment) (HHA)  Distance Ambulated (ft) 4 ft  Range of Motion/Exercises Active;All extremities  LUE Weight Bearing WBAT  RLE Weight Bearing WBAT  Activity Response Tolerated well  Mobility Referral Yes  $Mobility charge 1 Mobility  Mobility Specialist Start Time (ACUTE ONLY) 1206  Mobility Specialist Stop Time (ACUTE ONLY) 1215  Mobility Specialist Time Calculation (min) (ACUTE ONLY) 9 min   Pt agreeable to mobility session. Transferred pt B>C with HHA and MinA. Tolerated well, c/o dizziness. Recorded BP below. Left pt in chair, alarm on, call bell in reach, all needs met.   Supine: 146/66 (91) Sitting EOB: 123/66  Post Mobility: 109/89 (97)  Feliciana Rossetti Mobility Specialist Please contact via Special educational needs teacher or  Rehab office at [phone]

## 2022-11-11 NOTE — Progress Notes (Incomplete)
PHARMACY CONSULT NOTE FOR:  OUTPATIENT  PARENTERAL ANTIBIOTIC THERAPY (OPAT)  Indication:  Regimen:  End date: 6 weeks from OR 10/9  IV antibiotic discharge orders are pended. To discharging provider:  please sign these orders via discharge navigator,  Select New Orders & click on the button choice - Manage This Unsigned Work.     Thank you for allowing pharmacy to be a part of this patient's care.  Rolley Sims 11/11/2022, 4:12 PM

## 2022-11-11 NOTE — Progress Notes (Signed)
Michael Bender - 77 y.o. male MRN [id number]  Date of birth: [birth year]    Subjective:  77 year old male 2 day status post left wrist arthrotomy and washout for septic arthritis.  Doing well from the left wrist standpoint, pain is controlled.  Right hand and wrist with full ROM today without pain, currently using right hand to eat breakfast.    Objective:   VITALS:   Vitals:   11/11/22 0336 11/11/22 0343 11/11/22 0638 11/11/22 0825  BP:  (!) 154/65  (!) 184/78  Pulse: 85 84 92 94  Resp:  18  16  Temp:  98.3 F (36.8 C)    TempSrc:  Oral    SpO2: 100% 98% 97% 97%  Weight:      Height:        Left upper extremity: - Splint in place, dressing clean dry and intact - Digits will exposed with appropriate capillary refill, able to perform gentle range of motion of the digits without pain  Right hand: - Full digital range of motion without significant pain - No notable micromotion tenderness at the MCP, PIP or DIP joints - There is notable diffuse arthritis throughout the hand, particular deformities at the DIPs diffusely - No concern for active infection throughout the right hand    Lab Results  Component Value Date   WBC 9.7 11/09/2022   HGB 9.5 (L) 11/09/2022   HCT 27.9 (L) 11/09/2022   MCV 92.1 11/09/2022   PLT 161 11/09/2022     Assessment/Plan:  2 Days Post-Op status post left wrist arthrotomy and washout for septic arthritis.  -No further intervention from hand surgery standpoint at this juncture.  Right hand MRI reviewed, no clinical correlation with active infection in this area, underlying changes are inflammatory in nature with underlying arthritic changes.  Recommend anti-inflammatory medical management as needed.   -Left wrist surgical dressing can remain in place currently.  Remains clean dry and intact.  Will plan for 2-week follow-up for wound check and likely suture removal at that time, can be done as outpatient if needed.  -Medical management, IV  antibiotics per primary team     Samuella Cota, MD [place] 11/11/2022, 11:20 AM

## 2022-11-12 DIAGNOSIS — R7881 Bacteremia: Secondary | ICD-10-CM | POA: Diagnosis not present

## 2022-11-12 DIAGNOSIS — I4891 Unspecified atrial fibrillation: Secondary | ICD-10-CM | POA: Diagnosis not present

## 2022-11-12 DIAGNOSIS — Z7189 Other specified counseling: Secondary | ICD-10-CM | POA: Diagnosis not present

## 2022-11-12 DIAGNOSIS — R55 Syncope and collapse: Secondary | ICD-10-CM | POA: Diagnosis not present

## 2022-11-12 DIAGNOSIS — M4642 Discitis, unspecified, cervical region: Secondary | ICD-10-CM | POA: Diagnosis not present

## 2022-11-12 DIAGNOSIS — B9561 Methicillin susceptible Staphylococcus aureus infection as the cause of diseases classified elsewhere: Secondary | ICD-10-CM | POA: Diagnosis not present

## 2022-11-12 DIAGNOSIS — Z515 Encounter for palliative care: Secondary | ICD-10-CM | POA: Diagnosis not present

## 2022-11-12 LAB — BASIC METABOLIC PANEL
Anion gap: 11 (ref 5–15)
BUN: 20 mg/dL (ref 8–23)
CO2: 26 mmol/L (ref 22–32)
Calcium: 8 mg/dL — ABNORMAL LOW (ref 8.9–10.3)
Chloride: 99 mmol/L (ref 98–111)
Creatinine, Ser: 1.41 mg/dL — ABNORMAL HIGH (ref 0.61–1.24)
GFR, Estimated: 51 mL/min — ABNORMAL LOW (ref >60)
Glucose, Bld: 111 mg/dL — ABNORMAL HIGH (ref 70–99)
Potassium: 3 mmol/L — ABNORMAL LOW (ref 3.5–5.1)
Sodium: 136 mmol/L (ref 135–145)

## 2022-11-12 LAB — CULTURE, BLOOD (ROUTINE X 2)
Culture: NO GROWTH
Culture: NO GROWTH
Culture: NO GROWTH
Culture: NO GROWTH
Special Requests: ADEQUATE
Special Requests: ADEQUATE

## 2022-11-12 LAB — BODY FLUID CULTURE W GRAM STAIN

## 2022-11-12 LAB — ACID FAST SMEAR (AFB, MYCOBACTERIA): Acid Fast Smear: NEGATIVE

## 2022-11-12 LAB — GLUCOSE, CAPILLARY
Glucose-Capillary: 135 mg/dL — ABNORMAL HIGH (ref 70–99)
Glucose-Capillary: 132 mg/dL — ABNORMAL HIGH (ref 70–99)
Glucose-Capillary: 221 mg/dL — ABNORMAL HIGH (ref 70–99)
Glucose-Capillary: 169 mg/dL — ABNORMAL HIGH (ref 70–99)

## 2022-11-12 MED ORDER — AMLODIPINE BESYLATE 10 MG PO TABS
10.0000 mg | ORAL_TABLET | Freq: Every day | ORAL | Status: DC
  Administered 2022-11-12 – 2022-11-14 (×3): 10 mg via ORAL
  Filled 2022-11-12 (×3): qty 1

## 2022-11-12 MED ORDER — PANTOPRAZOLE SODIUM 40 MG PO TBEC
40.0000 mg | DELAYED_RELEASE_TABLET | Freq: Every day | ORAL | Status: DC
  Administered 2022-11-12 – 2022-11-14 (×3): 40 mg via ORAL
  Filled 2022-11-12 (×3): qty 1

## 2022-11-12 MED ORDER — ROSUVASTATIN CALCIUM 20 MG PO TABS: 40.0000 mg | ORAL_TABLET | Freq: Every day | ORAL | Status: DC

## 2022-11-12 MED ORDER — POTASSIUM CHLORIDE CRYS ER 20 MEQ PO TBCR
40.0000 meq | EXTENDED_RELEASE_TABLET | Freq: Two times a day (BID) | ORAL | Status: AC
  Administered 2022-11-12 (×2): 40 meq via ORAL
  Filled 2022-11-12 (×2): qty 2

## 2022-11-12 MED ORDER — ATORVASTATIN CALCIUM 40 MG PO TABS
40.0000 mg | ORAL_TABLET | Freq: Every day | ORAL | Status: DC
  Administered 2022-11-12 – 2022-11-14 (×3): 40 mg via ORAL
  Filled 2022-11-12 (×3): qty 1

## 2022-11-12 MED ORDER — POLYETHYLENE GLYCOL 3350 17 G PO PACK
17.0000 g | PACK | Freq: Two times a day (BID) | ORAL | Status: AC
  Administered 2022-11-12 – 2022-11-13 (×2): 17 g via ORAL
  Filled 2022-11-12 (×3): qty 1

## 2022-11-12 NOTE — Plan of Care (Signed)

## 2022-11-12 NOTE — Progress Notes (Signed)
Occupational Therapy Treatment Patient Details Name: Michael Bender MRN: [id number] DOB: 08-23-1945 Today's Date: 11/12/2022   History of present illness 77 y.o. male admitted 10/5 with syncope and new onset afib with RVR as well as generalized weakness and right index finger swelling and pain. R knee aspiration & injection 11/08/22 for R knee effusion. Pt underwent Left wrist arthrotomy with irrigation and debridement. PMH: peripheral vascular disease, hypertension, hyperlipidemia, history of orthostatic hypotension, GERD, recently admitted to the hospital from 10/27/2022 through 11/02/22 due to multiple falls, traumatic rhabdomyolysis, AKI, acute urinary retention with bladder outlet obstruction, BPH, was offered SNF and home health PT but declined   OT comments  Pt making progress with functional goals. Pt in bed upon arrival and agreeable to OOB activity, however declined remaining in recliner to eat lunch at end of session. Pt required constant cues for L UE NWB during mobility. Pt participated in simulated bathing tasks seated EOB, UB dressing and grooming tasks standing at sink with CG at RW. Pt denies dizziness throughout session. Pt talkative and cooperative. OT will continue to follow acutely to maximize level of function and safety BP Readings: Supine at rest: 141/69  Sitting EOB: 132/66  After activity:113/89       If plan is discharge home, recommend the following:  A little help with walking and/or transfers;A lot of help with bathing/dressing/bathroom;Assistance with cooking/housework;Supervision due to cognitive status;Help with stairs or ramp for entrance;Assist for transportation;Direct supervision/assist for medications management;Direct supervision/assist for financial management   Equipment Recommendations  Other (comment) (TBD at next venue of care)    Recommendations for Other Services      Precautions / Restrictions Precautions Precautions: Fall Precaution Comments:  many recent falls, hx of orthostatic hypotension Restrictions Weight Bearing Restrictions: No LUE Weight Bearing: Non weight bearing RLE Weight Bearing: Weight bearing as tolerated       Mobility Bed Mobility Overal bed mobility: Needs Assistance Bed Mobility: Supine to Sit     Supine to sit: Min assist     General bed mobility comments: Min A due to L UE, eeded constant cues for Non weight bearing.    Transfers Overall transfer level: Needs assistance Equipment used: Rolling walker (2 wheels) Transfers: Sit to/from Stand Sit to Stand: Min assist           General transfer comment: no c/o dizziness, min A for steadying. Pt will need PFRW for mobility     Balance Overall balance assessment: Needs assistance Sitting-balance support: Feet supported, No upper extremity supported Sitting balance-Leahy Scale: Good     Standing balance support: During functional activity, Single extremity supported Standing balance-Leahy Scale: Poor                             ADL either performed or assessed with clinical judgement   ADL Overall ADL's : Needs assistance/impaired     Grooming: Wash/dry hands;Wash/dry face;Contact guard assist;Standing   Upper Body Bathing: Minimal assistance;Sitting Upper Body Bathing Details (indicate cue type and reason): simulated Lower Body Bathing: Moderate assistance;Sitting/lateral leans Lower Body Bathing Details (indicate cue type and reason): simulated Upper Body Dressing : Minimal assistance;Sitting       Toilet Transfer: Minimal assistance;Ambulation;Rolling walker (2 wheels) Toilet Transfer Details (indicate cue type and reason): simulated to recliner Toileting- Clothing Manipulation and Hygiene: Minimal assistance;Sit to/from stand       Functional mobility during ADLs: Minimal assistance;Rolling walker (2 wheels) General ADL Comments: Pt would  need PFRW for ADL mobility using RW    Extremity/Trunk Assessment Upper  Extremity Assessment Upper Extremity Assessment: Generalized weakness;LUE deficits/detail LUE Deficits / Details: L wrist/foreatm immobilized, NWB   Lower Extremity Assessment Lower Extremity Assessment: Defer to PT evaluation   Cervical / Trunk Assessment Cervical / Trunk Assessment: Normal    Vision Baseline Vision/History: 0 No visual deficits Ability to See in Adequate Light: 0 Adequate Patient Visual Report: No change from baseline     Perception     Praxis      Cognition Arousal: Alert Behavior During Therapy: WFL for tasks assessed/performed Overall Cognitive Status: No family/caregiver present to determine baseline cognitive functioning Area of Impairment: Following commands, Safety/judgement, Problem solving                       Following Commands: Follows one step commands with increased time Safety/Judgement: Decreased awareness of safety, Decreased awareness of deficits     General Comments: decreased awareness of deficits/safety, constant verbal cues for L UE NWB during sit-stand transitions        Exercises      Shoulder Instructions       General Comments      Pertinent Vitals/ Pain       Pain Assessment Pain Assessment: 0-10 Pain Score: 3  Pain Location: L wrist Pain Descriptors / Indicators: Sore, Discomfort Pain Intervention(s): Monitored during session, Repositioned  Home Living                                          Prior Functioning/Environment              Frequency  Min 1X/week        Progress Toward Goals  OT Goals(current goals can now be found in the care plan section)  Progress towards OT goals: Progressing toward goals     Plan      Co-evaluation                 AM-PAC OT "6 Clicks" Daily Activity     Outcome Measure   Help from another person eating meals?: None (set up due to L UE immobilized) Help from another person taking care of personal grooming?: A Little Help  from another person toileting, which includes using toliet, bedpan, or urinal?: A Little Help from another person bathing (including washing, rinsing, drying)?: A Lot Help from another person to put on and taking off regular upper body clothing?: A Little Help from another person to put on and taking off regular lower body clothing?: A Lot 6 Click Score: 17    End of Session Equipment Utilized During Treatment: Gait belt;Rolling walker (2 wheels)  OT Visit Diagnosis: Unsteadiness on feet (R26.81);Other abnormalities of gait and mobility (R26.89);Muscle weakness (generalized) (M62.81);History of falling (Z91.81);Pain Pain - Right/Left: Left Pain - part of body: Hand;Arm   Activity Tolerance Patient tolerated treatment well   Patient Left with call bell/phone within reach;in bed;with bed alarm set   Nurse Communication          Time: 4098-1191 OT Time Calculation (min): 24 min  Charges: OT General Charges $OT Visit: 1 Visit OT Treatments $Self Care/Home Management : 8-22 mins $Therapeutic Activity: 8-22 mins  Galen Manila 11/12/2022, 1:58 PM

## 2022-11-12 NOTE — Plan of Care (Signed)
Problem: Health Behavior/Discharge Planning: Goal: Ability to manage health-related needs will improve Outcome: Progressing   Problem: Clinical Measurements: Goal: Ability to maintain clinical measurements within normal limits will improve Outcome: Progressing Goal: Respiratory complications will improve Outcome: Progressing Goal: Cardiovascular complication will be avoided Outcome: Progressing   Problem: Activity: Goal: Risk for activity intolerance will decrease Outcome: Progressing   Problem: Nutrition: Goal: Adequate nutrition will be maintained Outcome: Progressing   Problem: Pain Managment: Goal: General experience of comfort will improve Outcome: Progressing   Problem: Safety: Goal: Ability to remain free from injury will improve Outcome: Progressing   Problem: Skin Integrity: Goal: Risk for impaired skin integrity will decrease Outcome: Progressing

## 2022-11-12 NOTE — Progress Notes (Signed)
Patient ID: AGOSTINO GORIN, male   DOB: 06/21/1945, 77 y.o.   MRN: [id number]    Progress Note from the Palliative Medicine Team at [facility]   Patient Name: Michael Bender        Date: 11/12/2022 DOB: [birth year]  Age: 77 y.o. MRN#: [id number] Attending Physician: Marinda Elk, MD Primary Care Physician: Lula Olszewski, MD Admit Date: 11/05/2022   Reason for Consultation/Follow-up   Establishing Goals of Care   HPI/ Brief Hospital Review  77 y.o. male   admitted on 11/05/2022 with past medical history significant for peripheral vascular disease, hypertension, hyperlipidemia, history of orthostatic hypotension, GERD, PAD, COPD, history of cervical/lumbar hardware , chonic pain (Dr. Vear Clock)  recently admitted to the hospital from 10/27/2022 through 11/02/22 due to multiple falls, traumatic rhabdomyolysis, AKI, acute urinary retention with bladder outlet obstruction, BPH, was offered SNF and home health PT but declined.  S/p right knee aspiration yesterday  S/P left wrist irrigation and debridement  Admitted from home with recurrent episodes of syncope, witnessed by family members.  Associated with generalized weakness.  Found to have bacteremia.     Patient and family face treatment option decisions, advanced directive decisions and anticipatory care needs.    Subjective  Extensive chart review has been completed prior to meeting with patient  including labs, vital signs, imaging, progress/consult notes, orders, medications and available advance directive documents.    This NP assessed patient at the bedside as a follow up to  previous GOC discussions with Lorinda Creed, NP.      Patient is alert and oriented - reports better pain control. We discuss he hasn't needed any pain medicine since last night. He feels okay now. We discuss he has medication available PRN. He remains happy with plan of care and planning to go to rehab soon. Ultimate goal remains to regain  independence and return home.   Education offered today regarding  the importance of continued conversation with his  family and the medical providers regarding overall plan of care and treatment options,  ensuring decisions are within the context of the patients values and GOCs, now and into the future care.  Questions and concerns addressed   Discussed with primary team and nursing staff   Time: 25   minutes  Detailed review of medical records ( labs, imaging, vital signs), medically appropriate exam ( MS, skin, cardiac,  resp)   discussed with treatment team, counseling and education to patient, family, staff, documenting clinical information, medication management, coordination of care    Gabryel Talamo Andi Devon, DNP, [facility] Palliative Medicine Team Team Phone # [phone]  Pager # [phone]

## 2022-11-12 NOTE — Progress Notes (Signed)
[facility] PROGRESS NOTE    Progress Note  Michael Bender  ZOX:[id number] DOB: 01/05/1946 DOA: 11/05/2022 PCP: Lula Olszewski, MD     Brief Narrative:   Michael Bender is an 77 y.o. male past medical history significant peripheral vascular disease, essential hypertension, history of orthostatic hypotension recently discharged from the hospital on 11/02/2022 due to multiple falls, traumatic rhabdomyolysis acute kidney injury acute urinary retention due to bladder outlet obstruction and BPH, was offered skilled nursing facility but declined went home with PT came in with recurrent syncopal episode associated with generalized weakness found to to have MSSA bacteremia on blood cultures on 11/05/2022 and septic arthritis. ID was consulted recommended IV cefazolin surveillance blood culture on 11/07/2022 have been negative till date.   Assessment/Plan:   Septic left wrist arthritis/MSSA bacteremia: Continue IV cefazolin. Surveillance blood cultures on 11/07/2022 have been negative till date. MRI of the wrist  concerning for septic arthritis,  Orthopedic surgery recommended left wrist capsulotomy and washout on 11/09/2022. Infectious disease was consulted, MRI of the C-spine does show possible discitis/early infection of C5 cc disc. Ortho evaluated the right knee no concern for septic knee arthritis. Patient relates her pain is controlled. PT OT evaluated the patient will need skilled nursing facility, [facility] has been consulted. Further management per ID. ID to dictate when to place PICC line.  Syncope due to orthostatic vitals: IV fluids were stopped probably infection contributing to it. PT OT has been  evaluating the patient, will need skilled nursing facility.  Right hand pain: Orthopedic evaluated the MRI no clinical correlation with active infection there is inflammatory changes. Start colchicine, Ortho consult about crystal inflammatory changes  Elevated troponins: Likely  demand ischemia twelve-lead EKG showed no evidence of ischemia.  Newly diagnosed A-fib with RVR: With a chads Vascor greater than 4 started on IV diltiazem now switched to oral beta-blockers. Not a candidate for blood thinners due to recurrent falls and orthostasis, can be evaluated as an outpatient.  Hyperglycemia iatrogenic: With an A1c of 6.5 started on long-acting insulin plus sliding scale blood glucose is improved.  Hypokalemia/hypomagnesemia: Potassium is low this morning replete orally recheck in the morning. Try to keep magnesium greater than 2.  Acute kidney injury: Suspect prerenal azotemia. He was continued on Flomax started on IV fluids his creatinine returned to baseline.  Essential hypertension: Blood pressure is elevated today, start Norvasc.  Physical debility: PT OT is was consulted, will need to go to skilled nursing facility.  Constipation due to narcotics: Started on MiraLAX p.o. twice daily.   DVT prophylaxis: lovenox Family Communication:none Status is: Inpatient Remains inpatient appropriate because: Septic arthritis/MSSA bacteremia    Code Status:     Code Status Orders  (From admission, onward)           Start     Ordered   11/08/22 1420  Do not attempt resuscitation (DNR)- Limited -Do Not Intubate (DNI)  (Code Status)  Continuous       Question Answer Comment  If pulseless and not breathing No CPR or chest compressions.   In Pre-Arrest Conditions (Patient Is Breathing and Has A Pulse) Do not intubate. Provide all appropriate non-invasive medical interventions. Avoid ICU transfer unless indicated or required.   Consent: Discussion documented in EHR or advanced directives reviewed      11/08/22 1419           Code Status History     Date Active Date Inactive Code Status Order ID Comments User  Context   11/05/2022 2258 11/08/2022 1419 Full Code [id number]  Darlin Drop, DO ED   10/28/2022 0324 11/02/2022 1701 Full Code [id number]   Dolly Rias, MD Inpatient   07/17/2019 2250 07/21/2019 1754 DNR [id number]  John Giovanni, MD Inpatient   07/17/2019 2133 07/17/2019 2250 Full Code [id number]  John Giovanni, MD ED         IV Access:   Peripheral IV   Procedures and diagnostic studies:   [place] EKG SITE RITE  Result Date: 11/11/2022 If Site Rite image not attached, placement could not be confirmed due to current cardiac rhythm.    Medical Consultants:   None.   Subjective:    Michael Bender right hand pain is improved has no new complaints this morning.  He is tolerating his diet.  Has not had a bowel movement.  Objective:    Vitals:   11/11/22 1927 11/11/22 2000 11/11/22 2047 11/12/22 0341  BP: (!) 151/70  (!) 152/76 (!) 153/70  Pulse: 90 89 90 84  Resp: (!) 21 20 17 14   Temp: 99.8 F (37.7 C)   98.5 F (36.9 C)  TempSrc: Oral   Oral  SpO2: 97% 98% 97% 97%  Weight:      Height:       SpO2: 97 %   Intake/Output Summary (Last 24 hours) at 11/12/2022 0956 Last data filed at 11/12/2022 0636 Gross per 24 hour  Intake 491 ml  Output 1950 ml  Net -1459 ml   Filed Weights   11/05/22 1949  Weight: 61.2 kg    Exam: General exam: In no acute distress. Respiratory system: Good air movement and clear to auscultation. Cardiovascular system: S1 & S2 heard, RRR. No JVD. Gastrointestinal system: Abdomen is nondistended, soft and nontender.  Extremities: Left wrist in splint and wrap. Skin: No rashes, lesions or ulcers Psychiatry: Judgement and insight appear normal. Mood & affect appropriate. Data Reviewed:    Labs: Basic Metabolic Panel: Recent Labs  Lab 11/05/22 2035 11/06/22 0903 11/07/22 6433 11/08/22 0732 11/08/22 0849 11/08/22 1726 11/09/22 0437 11/09/22 1455 11/12/22 0538  NA 134*   < > 133* 134*  --   --  135 135 136  K 2.6*   < > 3.0* 2.9*  --   --  4.0 5.0 3.0*  CL 97*   < > 99 95*  --   --  102 99 99  CO2 21*   < > 24 26  --   --  24 24 26   GLUCOSE 222*   <  > 257* 321*  --  405* 137* 432* 111*  BUN 23   < > 21 18  --   --  20 24* 20  CREATININE 1.87*   < > 1.60* 1.32*  --   --  1.27* 1.45* 1.41*  CALCIUM 8.0*   < > 7.9* 8.2*  --   --  8.5* 8.0* 8.0*  MG 1.6*  --  1.8  --  1.8  --  2.0  --   --    < > = values in this interval not displayed.   GFR Estimated Creatinine Clearance: 38 mL/min (A) (by C-G formula based on SCr of 1.41 mg/dL (H)). Liver Function Tests: Recent Labs  Lab 11/05/22 2035  AST 28  ALT 22  ALKPHOS 116  BILITOT 0.4  PROT 5.7*  ALBUMIN 2.2*   No results for input(s): "LIPASE", "AMYLASE" in the last 168 hours. No results for input(s): "AMMONIA" in the  last 168 hours. Coagulation profile No results for input(s): "INR", "PROTIME" in the last 168 hours. COVID-19 Labs  No results for input(s): "DDIMER", "FERRITIN", "LDH", "CRP" in the last 72 hours.  Lab Results  Component Value Date   SARSCOV2NAA NEGATIVE 11/06/2022   SARSCOV2NAA RESULT: NEGATIVE 09/12/2019   SARSCOV2NAA NEGATIVE 07/17/2019   SARSCOV2NAA RESULT:  NEGATIVE 03/06/2019    CBC: Recent Labs  Lab 11/05/22 2035 11/06/22 0903 11/07/22 0608 11/09/22 0437  WBC 9.3 9.0 7.0 9.7  NEUTROABS 8.6*  --   --   --   HGB 9.3* 9.0* 8.6* 9.5*  HCT 28.4* 26.8* 25.4* 27.9*  MCV 92.5 92.4 90.4 92.1  PLT 146* 134* 125* 161   Cardiac Enzymes: Recent Labs  Lab 11/05/22 2035  CKTOTAL 108   BNP (last 3 results) No results for input(s): "PROBNP" in the last 8760 hours. CBG: Recent Labs  Lab 11/11/22 0824 11/11/22 1157 11/11/22 1624 11/11/22 2049 11/12/22 0825  GLUCAP 130* 102* 192* 220* 135*   D-Dimer: No results for input(s): "DDIMER" in the last 72 hours. Hgb A1c: No results for input(s): "HGBA1C" in the last 72 hours.  Lipid Profile: No results for input(s): "CHOL", "HDL", "LDLCALC", "TRIG", "CHOLHDL", "LDLDIRECT" in the last 72 hours. Thyroid function studies: No results for input(s): "TSH", "T4TOTAL", "T3FREE", "THYROIDAB" in the last 72  hours.  Invalid input(s): "FREET3" Anemia work up: No results for input(s): "VITAMINB12", "FOLATE", "FERRITIN", "TIBC", "IRON", "RETICCTPCT" in the last 72 hours. Sepsis Labs: Recent Labs  Lab 11/05/22 2035 11/06/22 0903 11/07/22 0608 11/09/22 0437  WBC 9.3 9.0 7.0 9.7   Microbiology Recent Results (from the past 240 hour(s))  Culture, blood (Routine X 2) w Reflex to ID Panel     Status: Abnormal   Collection Time: 11/06/22  1:13 AM   Specimen: BLOOD RIGHT ARM  Result Value Ref Range Status   Specimen Description BLOOD RIGHT ARM  Final   Special Requests   Final    BOTTLES DRAWN AEROBIC AND ANAEROBIC Blood Culture results may not be optimal due to an excessive volume of blood received in culture bottles   Culture  Setup Time   Final    GRAM POSITIVE COCCI IN BOTH AEROBIC AND ANAEROBIC BOTTLES Organism ID to follow CRITICAL RESULT CALLED TO, READ BACK BY AND VERIFIED WITH: PHARMD JESSICA MILLEN [id number] BY Berline Chough, [place] Performed at [facility], [address] [place], [place] [address]    Culture STAPHYLOCOCCUS AUREUS (A)  Final   Report Status 11/08/2022 FINAL  Final   Organism ID, Bacteria STAPHYLOCOCCUS AUREUS  Final      Susceptibility   Staphylococcus aureus - MIC*    CIPROFLOXACIN <=0.5 SENSITIVE Sensitive     ERYTHROMYCIN <=0.25 SENSITIVE Sensitive     GENTAMICIN <=0.5 SENSITIVE Sensitive     OXACILLIN 0.5 SENSITIVE Sensitive     TETRACYCLINE <=1 SENSITIVE Sensitive     VANCOMYCIN <=0.5 SENSITIVE Sensitive     TRIMETH/SULFA <=10 SENSITIVE Sensitive     CLINDAMYCIN <=0.25 SENSITIVE Sensitive     RIFAMPIN <=0.5 SENSITIVE Sensitive     Inducible Clindamycin NEGATIVE Sensitive     LINEZOLID 1 SENSITIVE Sensitive     * STAPHYLOCOCCUS AUREUS  Blood Culture ID Panel (Reflexed)     Status: Abnormal   Collection Time: 11/06/22  1:13 AM  Result Value Ref Range Status   Enterococcus faecalis NOT DETECTED NOT DETECTED Final   Enterococcus Faecium NOT DETECTED  NOT DETECTED Final   Listeria monocytogenes  NOT DETECTED NOT DETECTED Final   Staphylococcus species DETECTED (A) NOT DETECTED Final    Comment: CRITICAL RESULT CALLED TO, READ BACK BY AND VERIFIED WITH: PHARMD JESSICA MILLEN [id number] 1429 BY J RAZZAK, MT    Staphylococcus aureus (BCID) DETECTED (A) NOT DETECTED Final    Comment: CRITICAL RESULT CALLED TO, READ BACK BY AND VERIFIED WITH: PHARMD JESSICA MILLEN [id number] 1429 BY J RAZZAK, MT    Staphylococcus epidermidis NOT DETECTED NOT DETECTED Final   Staphylococcus lugdunensis NOT DETECTED NOT DETECTED Final   Streptococcus species NOT DETECTED NOT DETECTED Final   Streptococcus agalactiae NOT DETECTED NOT DETECTED Final   Streptococcus pneumoniae NOT DETECTED NOT DETECTED Final   Streptococcus pyogenes NOT DETECTED NOT DETECTED Final   A.calcoaceticus-baumannii NOT DETECTED NOT DETECTED Final   Bacteroides fragilis NOT DETECTED NOT DETECTED Final   Enterobacterales NOT DETECTED NOT DETECTED Final   Enterobacter cloacae complex NOT DETECTED NOT DETECTED Final   Escherichia coli NOT DETECTED NOT DETECTED Final   Klebsiella aerogenes NOT DETECTED NOT DETECTED Final   Klebsiella oxytoca NOT DETECTED NOT DETECTED Final   Klebsiella pneumoniae NOT DETECTED NOT DETECTED Final   Proteus species NOT DETECTED NOT DETECTED Final   Salmonella species NOT DETECTED NOT DETECTED Final   Serratia marcescens NOT DETECTED NOT DETECTED Final   Haemophilus influenzae NOT DETECTED NOT DETECTED Final   Neisseria meningitidis NOT DETECTED NOT DETECTED Final   Pseudomonas aeruginosa NOT DETECTED NOT DETECTED Final   Stenotrophomonas maltophilia NOT DETECTED NOT DETECTED Final   Candida albicans NOT DETECTED NOT DETECTED Final   Candida auris NOT DETECTED NOT DETECTED Final   Candida glabrata NOT DETECTED NOT DETECTED Final   Candida krusei NOT DETECTED NOT DETECTED Final   Candida parapsilosis NOT DETECTED NOT DETECTED Final   Candida tropicalis NOT  DETECTED NOT DETECTED Final   Cryptococcus neoformans/gattii NOT DETECTED NOT DETECTED Final   Meth resistant mecA/C and MREJ NOT DETECTED NOT DETECTED Final    Comment: Performed at [facility], [address], [place], [place] [address]  Culture, blood (Routine X 2) w Reflex to ID Panel     Status: Abnormal   Collection Time: 11/06/22  1:14 AM   Specimen: BLOOD RIGHT ARM  Result Value Ref Range Status   Specimen Description BLOOD RIGHT ARM  Final   Special Requests   Final    BOTTLES DRAWN AEROBIC AND ANAEROBIC Blood Culture results may not be optimal due to an excessive volume of blood received in culture bottles   Culture  Setup Time   Final    GRAM POSITIVE COCCI IN BOTH AEROBIC AND ANAEROBIC BOTTLES    Culture (A)  Final    STAPHYLOCOCCUS AUREUS SUSCEPTIBILITIES PERFORMED ON PREVIOUS CULTURE WITHIN THE LAST 5 DAYS. Performed at [facility], [address], [place], [place] [address]    Report Status 11/08/2022 FINAL  Final  MRSA Next Gen by PCR, Nasal     Status: None   Collection Time: 11/06/22  1:29 AM   Specimen: Nasal Mucosa; Nasal Swab  Result Value Ref Range Status   MRSA by PCR Next Gen NOT DETECTED NOT DETECTED Final    Comment: (NOTE) The GeneXpert MRSA Assay (FDA approved for NASAL specimens only), is one component of a comprehensive MRSA colonization surveillance program. It is not intended to diagnose MRSA infection nor to guide or monitor treatment for MRSA infections. Test performance is not FDA approved in patients less than 36 years old. Performed at  [facility], [address] [place] [place], [place] [address]   SARS Coronavirus 2 by RT PCR (hospital order, performed in [facility] hospital lab) *cepheid single result test* Anterior Nasal Swab     Status: None   Collection Time: 11/06/22  7:33 AM   Specimen: Anterior Nasal Swab  Result Value Ref Range Status   SARS Coronavirus 2 by RT PCR NEGATIVE NEGATIVE Final    Comment: Performed at  [facility], [address], [place], [place] [address]  Respiratory (~20 pathogens) panel by PCR     Status: None   Collection Time: 11/06/22  7:34 AM   Specimen: Nasopharyngeal Swab; Respiratory  Result Value Ref Range Status   Adenovirus NOT DETECTED NOT DETECTED Final   Coronavirus 229E NOT DETECTED NOT DETECTED Final    Comment: (NOTE) The Coronavirus on the Respiratory Panel, DOES NOT test for the novel  Coronavirus (2019 nCoV)    Coronavirus HKU1 NOT DETECTED NOT DETECTED Final   Coronavirus NL63 NOT DETECTED NOT DETECTED Final   Coronavirus OC43 NOT DETECTED NOT DETECTED Final   Metapneumovirus NOT DETECTED NOT DETECTED Final   Rhinovirus / Enterovirus NOT DETECTED NOT DETECTED Final   Influenza A NOT DETECTED NOT DETECTED Final   Influenza B NOT DETECTED NOT DETECTED Final   Parainfluenza Virus 1 NOT DETECTED NOT DETECTED Final   Parainfluenza Virus 2 NOT DETECTED NOT DETECTED Final   Parainfluenza Virus 3 NOT DETECTED NOT DETECTED Final   Parainfluenza Virus 4 NOT DETECTED NOT DETECTED Final   Respiratory Syncytial Virus NOT DETECTED NOT DETECTED Final   Bordetella pertussis NOT DETECTED NOT DETECTED Final   Bordetella Parapertussis NOT DETECTED NOT DETECTED Final   Chlamydophila pneumoniae NOT DETECTED NOT DETECTED Final   Mycoplasma pneumoniae NOT DETECTED NOT DETECTED Final    Comment: Performed at [facility], [address] [place], [place] [address]  Culture, blood (Routine X 2) w Reflex to ID Panel     Status: None (Preliminary result)   Collection Time: 11/07/22  6:08 AM   Specimen: BLOOD RIGHT ARM  Result Value Ref Range Status   Specimen Description BLOOD RIGHT ARM  Final   Special Requests   Final    BOTTLES DRAWN AEROBIC AND ANAEROBIC Blood Culture adequate volume   Culture   Final    NO GROWTH 4 DAYS Performed at [facility], [address] [place], [place] [address]    Report Status PENDING  Incomplete  Culture, blood (Routine X  2) w Reflex to ID Panel     Status: None (Preliminary result)   Collection Time: 11/07/22  6:08 AM   Specimen: BLOOD RIGHT HAND  Result Value Ref Range Status   Specimen Description BLOOD RIGHT HAND  Final   Special Requests   Final    BOTTLES DRAWN AEROBIC AND ANAEROBIC Blood Culture results may not be optimal due to an excessive volume of blood received in culture bottles   Culture   Final    NO GROWTH 4 DAYS Performed at [facility], [address], [place], [place] [address]    Report Status PENDING  Incomplete  Culture, blood (Routine X 2) w Reflex to ID Panel     Status: None (Preliminary result)   Collection Time: 11/07/22  9:19 PM   Specimen: BLOOD RIGHT HAND  Result Value Ref Range Status   Specimen Description BLOOD RIGHT HAND  Final   Special Requests   Final  BOTTLES DRAWN AEROBIC AND ANAEROBIC Blood Culture adequate volume   Culture   Final    NO GROWTH 4 DAYS Performed at [facility], [address] [place], [place] [address]    Report Status PENDING  Incomplete  Culture, blood (Routine X 2) w Reflex to ID Panel     Status: None (Preliminary result)   Collection Time: 11/07/22  9:19 PM   Specimen: BLOOD RIGHT HAND  Result Value Ref Range Status   Specimen Description BLOOD RIGHT HAND  Final   Special Requests   Final    BOTTLES DRAWN AEROBIC ONLY Blood Culture results may not be optimal due to an inadequate volume of blood received in culture bottles   Culture   Final    NO GROWTH 4 DAYS Performed at [facility], [address], [place], [place] [address]    Report Status PENDING  Incomplete  Body fluid culture w Gram Stain     Status: None   Collection Time: 11/08/22 12:05 PM   Specimen: Body Fluid  Result Value Ref Range Status   Specimen Description FLUID SYNOVIAL RIGHT KNEE  Final   Special Requests NONE  Final   Gram Stain   Final    RARE WBC PRESENT, PREDOMINANTLY PMN NO ORGANISMS SEEN    Culture   Final    NO GROWTH 3  DAYS Performed at [facility], [address], [place], [place] [address]    Report Status 11/11/2022 FINAL  Final  Gram stain     Status: None   Collection Time: 11/08/22  6:50 PM   Specimen: Synovium; Body Fluid  Result Value Ref Range Status   Specimen Description SYNOVIAL  Final   Special Requests WRIST  Final   Gram Stain   Final    ABUNDANT WBC PRESENT, PREDOMINANTLY PMN NO ORGANISMS SEEN Performed at [facility], [address] [place], [place] [address]    Report Status 11/08/2022 FINAL  Final  Body fluid culture w Gram Stain     Status: None (Preliminary result)   Collection Time: 11/08/22  7:17 PM   Specimen: Synovium  Result Value Ref Range Status   Specimen Description SYNOVIAL  Final   Special Requests WRIST  Final   Gram Stain   Final    ABUNDANT WBC PRESENT, PREDOMINANTLY PMN NO ORGANISMS SEEN    Culture   Final    RARE STAPHYLOCOCCUS AUREUS SUSCEPTIBILITIES TO FOLLOW Performed at [facility], [address] [place], [place] [address]    Report Status PENDING  Incomplete  Surgical pcr screen     Status: Abnormal   Collection Time: 11/09/22  4:36 AM   Specimen: Nasal Mucosa; Nasal Swab  Result Value Ref Range Status   MRSA, PCR NEGATIVE NEGATIVE Final   Staphylococcus aureus POSITIVE (A) NEGATIVE Final    Comment: (NOTE) The Xpert SA Assay (FDA approved for NASAL specimens in patients 26 years of age and older), is one component of a comprehensive surveillance program. It is not intended to diagnose infection nor to guide or monitor treatment. Performed at [facility], [address], [place], [place] [address]   Fungus Culture With Stain     Status: None (Preliminary result)   Collection Time: 11/09/22  7:40 AM   Specimen: PATH Cytology Misc. fluid; Body Fluid  Result Value Ref Range Status   Fungus Stain Final report  Final    Comment: (NOTE) Performed At: [facility]  [facility] [address] [place], [place]  [id number] Jolene Schimke MD WU:[id number]    Fungus (Mycology) Culture PENDING  Incomplete   Fungal Source SYNOVIAL  Final    Comment: Performed at [facility], [address] [place], [place] [address]  Aerobic/Anaerobic Culture w Gram Stain (surgical/deep wound)     Status: None (Preliminary result)   Collection Time: 11/09/22  7:40 AM   Specimen: PATH Cytology Misc. fluid; Body Fluid  Result Value Ref Range Status   Specimen Description SYNOVIAL  Final   Special Requests LEFT WRIST SYN FLD  Final   Gram Stain   Final    ABUNDANT WBC PRESENT, PREDOMINANTLY PMN NO ORGANISMS SEEN Performed at [facility], [address], [place], [place] [address]    Culture   Final    RARE STAPHYLOCOCCUS AUREUS NO ANAEROBES ISOLATED; CULTURE IN PROGRESS FOR 5 DAYS    Report Status PENDING  Incomplete   Organism ID, Bacteria STAPHYLOCOCCUS AUREUS  Final      Susceptibility   Staphylococcus aureus - MIC*    CIPROFLOXACIN <=0.5 SENSITIVE Sensitive     ERYTHROMYCIN <=0.25 SENSITIVE Sensitive     GENTAMICIN <=0.5 SENSITIVE Sensitive     OXACILLIN 0.5 SENSITIVE Sensitive     TETRACYCLINE <=1 SENSITIVE Sensitive     VANCOMYCIN 1 SENSITIVE Sensitive     TRIMETH/SULFA <=10 SENSITIVE Sensitive     CLINDAMYCIN <=0.25 SENSITIVE Sensitive     RIFAMPIN <=0.5 SENSITIVE Sensitive     Inducible Clindamycin NEGATIVE Sensitive     LINEZOLID 1 SENSITIVE Sensitive     * RARE STAPHYLOCOCCUS AUREUS  Acid Fast Smear (AFB)     Status: None   Collection Time: 11/09/22  7:40 AM   Specimen: PATH Cytology Misc. fluid; Body Fluid  Result Value Ref Range Status   AFB Specimen Processing Concentration  Final   Acid Fast Smear Negative  Final    Comment: (NOTE) Performed At: [facility] [address] [place], [place] [id number] Jolene Schimke MD MW:[id number]    Source (AFB) SYNOVIAL  Final    Comment: Performed at [facility], [address], [place], [place] [address]  Fungus  Culture Result     Status: None   Collection Time: 11/09/22  7:40 AM  Result Value Ref Range Status   Result 1 Comment  Final    Comment: (NOTE) KOH/Calcofluor preparation:  no fungus observed. Performed At: [facility] [address] [place], [place] [id number] Jolene Schimke MD VZ:[id number]      Medications:    amitriptyline  25 mg Oral QHS   Chlorhexidine Gluconate Cloth  6 each Topical Q0600   colchicine  0.6 mg Oral BID   enoxaparin (LOVENOX) injection  40 mg Subcutaneous Daily   feeding supplement (GLUCERNA SHAKE)  237 mL Oral TID BM   insulin aspart  0-15 Units Subcutaneous TID WC   insulin aspart  0-5 Units Subcutaneous QHS   insulin glargine-yfgn  5 Units Subcutaneous BID   metoprolol tartrate  25 mg Oral BID   mupirocin ointment  1 Application Nasal BID   tamsulosin  0.4 mg Oral QPC supper   Continuous Infusions:   ceFAZolin (ANCEF) IV 2 g (11/12/22 0533)      LOS: 7 days   Marinda Elk  [facility]  11/12/2022, 9:57 AM

## 2022-11-12 NOTE — Progress Notes (Signed)
.  Subjective: Orthopedics asked to re-examine patient due to concerns of right knee septic arthritis. Initial aspiration demonstrated cell count of 8800 WBC with cultures having NGTD.  Patient seen and examined at bedside. He states his knee pain has completely resolved and feels significantly better than it did at admission. He states he has been able to move and ambulate his knee without difficulty.     Objective: Vital signs in last 24 hours: Temp:  [98.3 F (36.8 C)-99.8 F (37.7 C)] 98.5 F (36.9 C) (10/12 0341) Pulse Rate:  [84-93] 84 (10/12 0341) Resp:  [14-21] 14 (10/12 0341) BP: (137-153)/(70-76) 153/70 (10/12 0341) SpO2:  [97 %-99 %] 97 % (10/12 0341)  Labs: No results for input(s): "HGB" in the last 72 hours. No results for input(s): "WBC", "RBC", "HCT", "PLT" in the last 72 hours. Recent Labs    11/09/22 1455 11/12/22 0538  NA 135 136  K 5.0 3.0*  CL 99 99  CO2 24 26  BUN 24* 20  CREATININE 1.45* 1.41*  GLUCOSE 432* 111*  CALCIUM 8.0* 8.0*   No results for input(s): "LABPT", "INR" in the last 72 hours.  Physical Exam: Well nourished, well developed male resting in bed Breathing comfortably on RA Right knee Bandange in place over previous arthrocentesis site No erythema or swelling Minimal effusion present Non tender to palpation globally Active ROM 10-120 painlessly Motor and sensory intact distally Foot warm and well perfused  Assessment/Plan: Mr. Baumgartner is a 77 year old male with concern for right septic knee in the setting of previous MSSA bacteremia and left septic wrist that is now 3 days s/p I&D  No concern for septic arthritis at this time. Patient has near painless ROM of knee without effusion. No idication for arthrocentesis.  Continue Orthopedic hand surgery recommendations for left wrist. Rest of medical management per primary team    Luci Bank 11/12/2022, 9:48 AM

## 2022-11-13 ENCOUNTER — Other Ambulatory Visit: Payer: Self-pay

## 2022-11-13 DIAGNOSIS — R55 Syncope and collapse: Secondary | ICD-10-CM | POA: Diagnosis not present

## 2022-11-13 DIAGNOSIS — I4891 Unspecified atrial fibrillation: Secondary | ICD-10-CM | POA: Diagnosis not present

## 2022-11-13 DIAGNOSIS — R7881 Bacteremia: Secondary | ICD-10-CM | POA: Diagnosis not present

## 2022-11-13 DIAGNOSIS — M4642 Discitis, unspecified, cervical region: Secondary | ICD-10-CM | POA: Diagnosis not present

## 2022-11-13 LAB — BASIC METABOLIC PANEL
Anion gap: 11 (ref 5–15)
BUN: 22 mg/dL (ref 8–23)
CO2: 25 mmol/L (ref 22–32)
Calcium: 8 mg/dL — ABNORMAL LOW (ref 8.9–10.3)
Chloride: 101 mmol/L (ref 98–111)
Creatinine, Ser: 1.1 mg/dL (ref 0.61–1.24)
GFR, Estimated: >60 mL/min (ref >60)
Glucose, Bld: 150 mg/dL — ABNORMAL HIGH (ref 70–99)
Potassium: 4 mmol/L (ref 3.5–5.1)
Sodium: 137 mmol/L (ref 135–145)

## 2022-11-13 LAB — GLUCOSE, CAPILLARY
Glucose-Capillary: 166 mg/dL — ABNORMAL HIGH (ref 70–99)
Glucose-Capillary: 207 mg/dL — ABNORMAL HIGH (ref 70–99)
Glucose-Capillary: 99 mg/dL (ref 70–99)
Glucose-Capillary: 214 mg/dL — ABNORMAL HIGH (ref 70–99)

## 2022-11-13 MED ORDER — SODIUM CHLORIDE 0.9% FLUSH: 10.0000 mL | INTRAVENOUS | Status: DC | PRN

## 2022-11-13 MED ORDER — MELATONIN 3 MG PO TABS: 3.0000 mg | ORAL_TABLET | Freq: Every day | ORAL | Status: DC

## 2022-11-13 MED ORDER — SODIUM CHLORIDE 0.9% FLUSH
10.0000 mL | Freq: Two times a day (BID) | INTRAVENOUS | Status: DC
  Administered 2022-11-13 – 2022-11-14 (×2): 10 mL

## 2022-11-13 NOTE — Progress Notes (Signed)
Peripherally Inserted Central Catheter Placement  The IV Nurse has discussed with the patient and/or persons authorized to consent for the patient, the purpose of this procedure and the potential benefits and risks involved with this procedure.  The benefits include less needle sticks, lab draws from the catheter, and the patient may be discharged home with the catheter. Risks include, but not limited to, infection, bleeding, blood clot (thrombus formation), and puncture of an artery; nerve damage and irregular heartbeat and possibility to perform a PICC exchange if needed/ordered by physician.  Alternatives to this procedure were also discussed.  Bard Power PICC patient education guide, fact sheet on infection prevention and patient information card has been provided to patient /or left at bedside.    PICC Placement Documentation  PICC Single Lumen 11/13/22 Right Brachial 38 cm 0 cm (Active)  Indication for Insertion or Continuance of Line Prolonged intravenous therapies 11/13/22 1553  Exposed Catheter (cm) 0 cm 11/13/22 1553  Site Assessment Clean, Dry, Intact 11/13/22 1553  Line Status Saline locked;Blood return noted 11/13/22 1553  Dressing Type Transparent;Securing device 11/13/22 1553  Dressing Status Antimicrobial disc in place;Clean, Dry, Intact 11/13/22 1553  Line Care Connections checked and tightened 11/13/22 1553  Line Adjustment (NICU/IV Team Only) No 11/13/22 1553  Dressing Intervention New dressing;Adhesive placed at insertion site (IV team only) 11/13/22 1553  Dressing Change Due 11/20/22 11/13/22 1553       Burnard Bunting Chenice 11/13/2022, 3:54 PM

## 2022-11-13 NOTE — Plan of Care (Signed)
  Problem: Education: Goal: Knowledge of General Education information will improve Description: Including pain rating scale, medication(s)/side effects and non-pharmacologic comfort measures Outcome: Progressing   Problem: Health Behavior/Discharge Planning: Goal: Ability to manage health-related needs will improve Outcome: Progressing   Problem: Clinical Measurements: Goal: Ability to maintain clinical measurements within normal limits will improve Outcome: Progressing Goal: Will remain free from infection Outcome: Progressing Goal: Diagnostic test results will improve Outcome: Progressing Goal: Respiratory complications will improve Outcome: Progressing Goal: Cardiovascular complication will be avoided Outcome: Progressing   Problem: Activity: Goal: Risk for activity intolerance will decrease Outcome: Progressing   Problem: Nutrition: Goal: Adequate nutrition will be maintained Outcome: Progressing   Problem: Coping: Goal: Level of anxiety will decrease Outcome: Progressing   Problem: Elimination: Goal: Will not experience complications related to bowel motility Outcome: Progressing Goal: Will not experience complications related to urinary retention Outcome: Progressing   Problem: Pain Managment: Goal: General experience of comfort will improve Outcome: Progressing   Problem: Safety: Goal: Ability to remain free from injury will improve Outcome: Progressing   Problem: Skin Integrity: Goal: Risk for impaired skin integrity will decrease Outcome: Progressing   Problem: Education: Goal: Ability to describe self-care measures that may prevent or decrease complications (Diabetes Survival Skills Education) will improve Outcome: Progressing Goal: Individualized Educational Video(s) Outcome: Progressing   Problem: Coping: Goal: Ability to adjust to condition or change in health will improve Outcome: Progressing   Problem: Health Behavior/Discharge  Planning: Goal: Ability to identify and utilize available resources and services will improve Outcome: Progressing Goal: Ability to manage health-related needs will improve Outcome: Progressing   Problem: Nutritional: Goal: Maintenance of adequate nutrition will improve Outcome: Progressing Goal: Progress toward achieving an optimal weight will improve Outcome: Progressing

## 2022-11-13 NOTE — Progress Notes (Signed)
[facility] PROGRESS NOTE    Progress Note  Michael BARTIK  ZOX:[id number] DOB: 02-03-1945 DOA: 11/05/2022 PCP: Lula Olszewski, MD     Brief Narrative:   Michael Bender is an 77 y.o. male past medical history significant peripheral vascular disease, essential hypertension, history of orthostatic hypotension recently discharged from the hospital on 11/02/2022 due to multiple falls, traumatic rhabdomyolysis acute kidney injury acute urinary retention due to bladder outlet obstruction and BPH, was offered skilled nursing facility but declined went home with PT came in with recurrent syncopal episode associated with generalized weakness found to to have MSSA bacteremia on blood cultures on 11/05/2022 and septic arthritis. ID was consulted recommended IV cefazolin surveillance blood culture on 11/07/2022 have been negative till date.   Assessment/Plan:   Septic left wrist arthritis/discitis C5-C6/MSSA bacteremia: Continue IV cefazolin. Surveillance blood cultures on 11/07/2022 have been negative till date. MRI of the wrist  concerning for septic arthritis, status post wrist capsulotomy and washout on 11/09/2022. Infectious disease was consulted, MRI of the C-spine does show possible discitis/early infection of C5 -C6 discitis Ortho evaluated the right knee no concern for septic knee arthritis. Patient relates her pain is controlled. PT OT evaluated the patient will need skilled nursing facility, [facility] has been consulted. Further management per ID. ID to dictate when to place PICC line.  Syncope due to orthostatic vitals: IV fluids were stopped probably infection contributing to it. PT OT has been  evaluating the patient, will need skilled nursing facility.  Right hand pain: Orthopedic evaluated the MRI no clinical correlation with active infection there is inflammatory changes. Start colchicine, Ortho consult about crystal inflammatory changes  Elevated troponins: Likely demand  ischemia twelve-lead EKG showed no evidence of ischemia.  Newly diagnosed A-fib with RVR: With a chads Vascor greater than 4 started on IV diltiazem now switched to oral beta-blockers. Not a candidate for blood thinners due to recurrent falls and orthostasis, can be evaluated as an outpatient.  Hyperglycemia iatrogenic: With an A1c of 6.5 started on long-acting insulin plus sliding scale blood glucose is improved.  Hypokalemia/hypomagnesemia: Potassium is low this morning replete orally recheck in the morning. Try to keep magnesium greater than 2.  Acute kidney injury: Suspect prerenal azotemia. He was continued on Flomax started on IV fluids his creatinine returned to baseline.  Essential hypertension: Blood pressure is elevated today, start Norvasc.  Physical debility: PT OT is was consulted, will need to go to skilled nursing facility.  Constipation due to narcotics: Started on MiraLAX p.o. twice daily.   DVT prophylaxis: lovenox Family Communication:none Status is: Inpatient Remains inpatient appropriate because: Septic arthritis/MSSA bacteremia    Code Status:     Code Status Orders  (From admission, onward)           Start     Ordered   11/08/22 1420  Do not attempt resuscitation (DNR)- Limited -Do Not Intubate (DNI)  (Code Status)  Continuous       Question Answer Comment  If pulseless and not breathing No CPR or chest compressions.   In Pre-Arrest Conditions (Patient Is Breathing and Has A Pulse) Do not intubate. Provide all appropriate non-invasive medical interventions. Avoid ICU transfer unless indicated or required.   Consent: Discussion documented in EHR or advanced directives reviewed      11/08/22 1419           Code Status History     Date Active Date Inactive Code Status Order ID Comments User Context  11/05/2022 2258 11/08/2022 1419 Full Code [id number]  Darlin Drop, DO ED   10/28/2022 0324 11/02/2022 1701 Full Code [id number]  Dolly Rias, MD Inpatient   07/17/2019 2250 07/21/2019 1754 DNR [id number]  John Giovanni, MD Inpatient   07/17/2019 2133 07/17/2019 2250 Full Code [id number]  John Giovanni, MD ED         IV Access:   Peripheral IV   Procedures and diagnostic studies:   [place] EKG SITE RITE  Result Date: 11/11/2022 If Site Rite image not attached, placement could not be confirmed due to current cardiac rhythm.    Medical Consultants:   None.   Subjective:    [name] no complaints this morning feels great. Objective:    Vitals:   11/12/22 1726 11/12/22 1929 11/13/22 0326 11/13/22 0730  BP: (!) 146/61 (!) 143/64 (!) 149/86 (!) 153/75  Pulse: 87 89 82 90  Resp: (!) 25 19 15 16   Temp: 98.2 F (36.8 C) 99 F (37.2 C) 98.6 F (37 C) 98.7 F (37.1 C)  TempSrc: Oral Oral Oral Oral  SpO2: 98% 96% 92% 97%  Weight:      Height:       SpO2: 97 %   Intake/Output Summary (Last 24 hours) at 11/13/2022 0944 Last data filed at 11/13/2022 0800 Gross per 24 hour  Intake 1092.04 ml  Output 2400 ml  Net -1307.96 ml   Filed Weights   11/05/22 1949  Weight: 61.2 kg    Exam: General exam: In no acute distress. Respiratory system: Good air movement and clear to auscultation. Cardiovascular system: S1 & S2 heard, RRR. No JVD. Gastrointestinal system: Abdomen is nondistended, soft and nontender.  Extremities: No pedal edema. Skin: No rashes, lesions or ulcers Psychiatry: Judgement and insight appear normal. Mood & affect appropriate. Data Reviewed:    Labs: Basic Metabolic Panel: Recent Labs  Lab 11/07/22 0608 11/08/22 0732 11/08/22 0849 11/08/22 1726 11/09/22 0437 11/09/22 1455 11/12/22 0538 11/13/22 0500  NA 133* 134*  --   --  135 135 136 137  K 3.0* 2.9*  --   --  4.0 5.0 3.0* 4.0  CL 99 95*  --   --  102 99 99 101  CO2 24 26  --   --  24 24 26 25   GLUCOSE 257* 321*  --  405* 137* 432* 111* 150*  BUN 21 18  --   --  20 24* 20 22  CREATININE 1.60* 1.32*  --    --  1.27* 1.45* 1.41* 1.10  CALCIUM 7.9* 8.2*  --   --  8.5* 8.0* 8.0* 8.0*  MG 1.8  --  1.8  --  2.0  --   --   --    GFR Estimated Creatinine Clearance: 48.7 mL/min (by C-G formula based on SCr of 1.1 mg/dL). Liver Function Tests: No results for input(s): "AST", "ALT", "ALKPHOS", "BILITOT", "PROT", "ALBUMIN" in the last 168 hours.  No results for input(s): "LIPASE", "AMYLASE" in the last 168 hours. No results for input(s): "AMMONIA" in the last 168 hours. Coagulation profile No results for input(s): "INR", "PROTIME" in the last 168 hours. COVID-19 Labs  No results for input(s): "DDIMER", "FERRITIN", "LDH", "CRP" in the last 72 hours.  Lab Results  Component Value Date   SARSCOV2NAA NEGATIVE 11/06/2022   SARSCOV2NAA RESULT: NEGATIVE 09/12/2019   SARSCOV2NAA NEGATIVE 07/17/2019   SARSCOV2NAA RESULT:  NEGATIVE 03/06/2019    CBC: Recent Labs  Lab 11/07/22 0608 11/09/22 0437  WBC  7.0 9.7  HGB 8.6* 9.5*  HCT 25.4* 27.9*  MCV 90.4 92.1  PLT 125* 161   Cardiac Enzymes: No results for input(s): "CKTOTAL", "CKMB", "CKMBINDEX", "TROPONINI" in the last 168 hours.  BNP (last 3 results) No results for input(s): "PROBNP" in the last 8760 hours. CBG: Recent Labs  Lab 11/12/22 0825 11/12/22 1248 11/12/22 1637 11/12/22 2101 11/13/22 0733  GLUCAP 135* 132* 221* 169* 166*   D-Dimer: No results for input(s): "DDIMER" in the last 72 hours. Hgb A1c: No results for input(s): "HGBA1C" in the last 72 hours.  Lipid Profile: No results for input(s): "CHOL", "HDL", "LDLCALC", "TRIG", "CHOLHDL", "LDLDIRECT" in the last 72 hours. Thyroid function studies: No results for input(s): "TSH", "T4TOTAL", "T3FREE", "THYROIDAB" in the last 72 hours.  Invalid input(s): "FREET3" Anemia work up: No results for input(s): "VITAMINB12", "FOLATE", "FERRITIN", "TIBC", "IRON", "RETICCTPCT" in the last 72 hours. Sepsis Labs: Recent Labs  Lab 11/07/22 0608 11/09/22 0437  WBC 7.0 9.7    Microbiology Recent Results (from the past 240 hour(s))  Culture, blood (Routine X 2) w Reflex to ID Panel     Status: Abnormal   Collection Time: 11/06/22  1:13 AM   Specimen: BLOOD RIGHT ARM  Result Value Ref Range Status   Specimen Description BLOOD RIGHT ARM  Final   Special Requests   Final    BOTTLES DRAWN AEROBIC AND ANAEROBIC Blood Culture results may not be optimal due to an excessive volume of blood received in culture bottles   Culture  Setup Time   Final    GRAM POSITIVE COCCI IN BOTH AEROBIC AND ANAEROBIC BOTTLES Organism ID to follow CRITICAL RESULT CALLED TO, READ BACK BY AND VERIFIED WITH: PHARMD JESSICA MILLEN [id number] 1429 BY Berline Chough, [place] Performed at [facility], [address] [place], [place] [address]    Culture STAPHYLOCOCCUS AUREUS (A)  Final   Report Status 11/08/2022 FINAL  Final   Organism ID, Bacteria STAPHYLOCOCCUS AUREUS  Final      Susceptibility   Staphylococcus aureus - MIC*    CIPROFLOXACIN <=0.5 SENSITIVE Sensitive     ERYTHROMYCIN <=0.25 SENSITIVE Sensitive     GENTAMICIN <=0.5 SENSITIVE Sensitive     OXACILLIN 0.5 SENSITIVE Sensitive     TETRACYCLINE <=1 SENSITIVE Sensitive     VANCOMYCIN <=0.5 SENSITIVE Sensitive     TRIMETH/SULFA <=10 SENSITIVE Sensitive     CLINDAMYCIN <=0.25 SENSITIVE Sensitive     RIFAMPIN <=0.5 SENSITIVE Sensitive     Inducible Clindamycin NEGATIVE Sensitive     LINEZOLID 1 SENSITIVE Sensitive     * STAPHYLOCOCCUS AUREUS  Blood Culture ID Panel (Reflexed)     Status: Abnormal   Collection Time: 11/06/22  1:13 AM  Result Value Ref Range Status   Enterococcus faecalis NOT DETECTED NOT DETECTED Final   Enterococcus Faecium NOT DETECTED NOT DETECTED Final   Listeria monocytogenes NOT DETECTED NOT DETECTED Final   Staphylococcus species DETECTED (A) NOT DETECTED Final    Comment: CRITICAL RESULT CALLED TO, READ BACK BY AND VERIFIED WITH: PHARMD JESSICA MILLEN [id number] 1429 BY J RAZZAK, MT    Staphylococcus  aureus (BCID) DETECTED (A) NOT DETECTED Final    Comment: CRITICAL RESULT CALLED TO, READ BACK BY AND VERIFIED WITH: PHARMD JESSICA MILLEN [id number] 1429 BY J RAZZAK, MT    Staphylococcus epidermidis NOT DETECTED NOT DETECTED Final   Staphylococcus lugdunensis NOT DETECTED NOT DETECTED Final   Streptococcus species NOT DETECTED NOT DETECTED Final   Streptococcus agalactiae NOT DETECTED  NOT DETECTED Final   Streptococcus pneumoniae NOT DETECTED NOT DETECTED Final   Streptococcus pyogenes NOT DETECTED NOT DETECTED Final   A.calcoaceticus-baumannii NOT DETECTED NOT DETECTED Final   Bacteroides fragilis NOT DETECTED NOT DETECTED Final   Enterobacterales NOT DETECTED NOT DETECTED Final   Enterobacter cloacae complex NOT DETECTED NOT DETECTED Final   Escherichia coli NOT DETECTED NOT DETECTED Final   Klebsiella aerogenes NOT DETECTED NOT DETECTED Final   Klebsiella oxytoca NOT DETECTED NOT DETECTED Final   Klebsiella pneumoniae NOT DETECTED NOT DETECTED Final   Proteus species NOT DETECTED NOT DETECTED Final   Salmonella species NOT DETECTED NOT DETECTED Final   Serratia marcescens NOT DETECTED NOT DETECTED Final   Haemophilus influenzae NOT DETECTED NOT DETECTED Final   Neisseria meningitidis NOT DETECTED NOT DETECTED Final   Pseudomonas aeruginosa NOT DETECTED NOT DETECTED Final   Stenotrophomonas maltophilia NOT DETECTED NOT DETECTED Final   Candida albicans NOT DETECTED NOT DETECTED Final   Candida auris NOT DETECTED NOT DETECTED Final   Candida glabrata NOT DETECTED NOT DETECTED Final   Candida krusei NOT DETECTED NOT DETECTED Final   Candida parapsilosis NOT DETECTED NOT DETECTED Final   Candida tropicalis NOT DETECTED NOT DETECTED Final   Cryptococcus neoformans/gattii NOT DETECTED NOT DETECTED Final   Meth resistant mecA/C and MREJ NOT DETECTED NOT DETECTED Final    Comment: Performed at [facility], [address], [place], [place] [address]  Culture, blood (Routine X 2) w  Reflex to ID Panel     Status: Abnormal   Collection Time: 11/06/22  1:14 AM   Specimen: BLOOD RIGHT ARM  Result Value Ref Range Status   Specimen Description BLOOD RIGHT ARM  Final   Special Requests   Final    BOTTLES DRAWN AEROBIC AND ANAEROBIC Blood Culture results may not be optimal due to an excessive volume of blood received in culture bottles   Culture  Setup Time   Final    GRAM POSITIVE COCCI IN BOTH AEROBIC AND ANAEROBIC BOTTLES    Culture (A)  Final    STAPHYLOCOCCUS AUREUS SUSCEPTIBILITIES PERFORMED ON PREVIOUS CULTURE WITHIN THE LAST 5 DAYS. Performed at [facility], [address] [place], [place] [address]    Report Status 11/08/2022 FINAL  Final  MRSA Next Gen by PCR, Nasal     Status: None   Collection Time: 11/06/22  1:29 AM   Specimen: Nasal Mucosa; Nasal Swab  Result Value Ref Range Status   MRSA by PCR Next Gen NOT DETECTED NOT DETECTED Final    Comment: (NOTE) The GeneXpert MRSA Assay (FDA approved for NASAL specimens only), is one component of a comprehensive MRSA colonization surveillance program. It is not intended to diagnose MRSA infection nor to guide or monitor treatment for MRSA infections. Test performance is not FDA approved in patients less than 24 years old. Performed at [facility], [address] [place], [place] [address]   SARS Coronavirus 2 by RT PCR (hospital order, performed in [facility] hospital lab) *cepheid single result test* Anterior Nasal Swab     Status: None   Collection Time: 11/06/22  7:33 AM   Specimen: Anterior Nasal Swab  Result Value Ref Range Status   SARS Coronavirus 2 by RT PCR NEGATIVE NEGATIVE Final    Comment: Performed at [facility], [address], [place], [place] [address]  Respiratory (~20 pathogens) panel by PCR     Status: None   Collection Time:  11/06/22  7:34 AM   Specimen: Nasopharyngeal Swab; Respiratory  Result Value Ref Range Status   Adenovirus NOT DETECTED NOT DETECTED Final    Coronavirus 229E NOT DETECTED NOT DETECTED Final    Comment: (NOTE) The Coronavirus on the Respiratory Panel, DOES NOT test for the novel  Coronavirus (2019 nCoV)    Coronavirus HKU1 NOT DETECTED NOT DETECTED Final   Coronavirus NL63 NOT DETECTED NOT DETECTED Final   Coronavirus OC43 NOT DETECTED NOT DETECTED Final   Metapneumovirus NOT DETECTED NOT DETECTED Final   Rhinovirus / Enterovirus NOT DETECTED NOT DETECTED Final   Influenza A NOT DETECTED NOT DETECTED Final   Influenza B NOT DETECTED NOT DETECTED Final   Parainfluenza Virus 1 NOT DETECTED NOT DETECTED Final   Parainfluenza Virus 2 NOT DETECTED NOT DETECTED Final   Parainfluenza Virus 3 NOT DETECTED NOT DETECTED Final   Parainfluenza Virus 4 NOT DETECTED NOT DETECTED Final   Respiratory Syncytial Virus NOT DETECTED NOT DETECTED Final   Bordetella pertussis NOT DETECTED NOT DETECTED Final   Bordetella Parapertussis NOT DETECTED NOT DETECTED Final   Chlamydophila pneumoniae NOT DETECTED NOT DETECTED Final   Mycoplasma pneumoniae NOT DETECTED NOT DETECTED Final    Comment: Performed at [facility], [address] [place], [place] [address]  Culture, blood (Routine X 2) w Reflex to ID Panel     Status: None   Collection Time: 11/07/22  6:08 AM   Specimen: BLOOD RIGHT ARM  Result Value Ref Range Status   Specimen Description BLOOD RIGHT ARM  Final   Special Requests   Final    BOTTLES DRAWN AEROBIC AND ANAEROBIC Blood Culture adequate volume   Culture   Final    NO GROWTH 5 DAYS Performed at [facility], [address] [place], [place] [address]    Report Status 11/12/2022 FINAL  Final  Culture, blood (Routine X 2) w Reflex to ID Panel     Status: None   Collection Time: 11/07/22  6:08 AM   Specimen: BLOOD RIGHT HAND  Result Value Ref Range Status   Specimen Description BLOOD RIGHT HAND  Final   Special Requests   Final    BOTTLES DRAWN AEROBIC AND ANAEROBIC Blood Culture results may not be optimal due  to an excessive volume of blood received in culture bottles   Culture   Final    NO GROWTH 5 DAYS Performed at [facility], [address] [place], [place] [address]    Report Status 11/12/2022 FINAL  Final  Culture, blood (Routine X 2) w Reflex to ID Panel     Status: None   Collection Time: 11/07/22  9:19 PM   Specimen: BLOOD RIGHT HAND  Result Value Ref Range Status   Specimen Description BLOOD RIGHT HAND  Final   Special Requests   Final    BOTTLES DRAWN AEROBIC AND ANAEROBIC Blood Culture adequate volume   Culture   Final    NO GROWTH 5 DAYS Performed at [facility], [address], [place], [place] [address]    Report Status 11/12/2022 FINAL  Final  Culture, blood (Routine X 2) w Reflex to ID Panel     Status: None   Collection Time: 11/07/22  9:19 PM   Specimen: BLOOD RIGHT HAND  Result Value Ref Range Status   Specimen Description BLOOD RIGHT HAND  Final   Special Requests   Final    BOTTLES DRAWN AEROBIC ONLY Blood Culture results  may not be optimal due to an inadequate volume of blood received in culture bottles   Culture   Final    NO GROWTH 5 DAYS Performed at [facility], [address] [place], [place] [address]    Report Status 11/12/2022 FINAL  Final  Body fluid culture w Gram Stain     Status: None   Collection Time: 11/08/22 12:05 PM   Specimen: Body Fluid  Result Value Ref Range Status   Specimen Description FLUID SYNOVIAL RIGHT KNEE  Final   Special Requests NONE  Final   Gram Stain   Final    RARE WBC PRESENT, PREDOMINANTLY PMN NO ORGANISMS SEEN    Culture   Final    NO GROWTH 3 DAYS Performed at [facility], [address], [place], [place] [address]    Report Status 11/11/2022 FINAL  Final  Gram stain     Status: None   Collection Time: 11/08/22  6:50 PM   Specimen: Synovium; Body Fluid  Result Value Ref Range Status   Specimen Description SYNOVIAL  Final   Special Requests WRIST  Final   Gram Stain   Final     ABUNDANT WBC PRESENT, PREDOMINANTLY PMN NO ORGANISMS SEEN Performed at [facility], [address] [place], [place] [address]    Report Status 11/08/2022 FINAL  Final  Body fluid culture w Gram Stain     Status: None   Collection Time: 11/08/22  7:17 PM   Specimen: Synovium  Result Value Ref Range Status   Specimen Description SYNOVIAL  Final   Special Requests WRIST  Final   Gram Stain   Final    ABUNDANT WBC PRESENT, PREDOMINANTLY PMN NO ORGANISMS SEEN Performed at [facility], [address], [place], [place] [address]    Culture RARE STAPHYLOCOCCUS AUREUS  Final   Report Status 11/12/2022 FINAL  Final   Organism ID, Bacteria STAPHYLOCOCCUS AUREUS  Final      Susceptibility   Staphylococcus aureus - MIC*    CIPROFLOXACIN <=0.5 SENSITIVE Sensitive     ERYTHROMYCIN <=0.25 SENSITIVE Sensitive     GENTAMICIN <=0.5 SENSITIVE Sensitive     OXACILLIN 0.5 SENSITIVE Sensitive     TETRACYCLINE <=1 SENSITIVE Sensitive     VANCOMYCIN <=0.5 SENSITIVE Sensitive     TRIMETH/SULFA <=10 SENSITIVE Sensitive     CLINDAMYCIN <=0.25 SENSITIVE Sensitive     RIFAMPIN <=0.5 SENSITIVE Sensitive     Inducible Clindamycin NEGATIVE Sensitive     LINEZOLID 2 SENSITIVE Sensitive     * RARE STAPHYLOCOCCUS AUREUS  Surgical pcr screen     Status: Abnormal   Collection Time: 11/09/22  4:36 AM   Specimen: Nasal Mucosa; Nasal Swab  Result Value Ref Range Status   MRSA, PCR NEGATIVE NEGATIVE Final   Staphylococcus aureus POSITIVE (A) NEGATIVE Final    Comment: (NOTE) The Xpert SA Assay (FDA approved for NASAL specimens in patients 4 years of age and older), is one component of a comprehensive surveillance program. It is not intended to diagnose infection nor to guide or monitor treatment. Performed at [facility], [address], [place], [place] [address]   Fungus Culture With Stain     Status: None (Preliminary result)   Collection Time: 11/09/22  7:40 AM   Specimen: PATH  Cytology Misc. fluid; Body Fluid  Result Value Ref Range Status   Fungus Stain Final report  Final    Comment: (NOTE) Performed At:  [facility] [address] [place], [place] [id number] Jolene Schimke MD WU:[id number]    Fungus (Mycology) Culture PENDING  Incomplete   Fungal Source SYNOVIAL  Final    Comment: Performed at [facility], [address] [place], [place] [address]  Aerobic/Anaerobic Culture w Gram Stain (surgical/deep wound)     Status: None (Preliminary result)   Collection Time: 11/09/22  7:40 AM   Specimen: PATH Cytology Misc. fluid; Body Fluid  Result Value Ref Range Status   Specimen Description SYNOVIAL  Final   Special Requests LEFT WRIST SYN FLD  Final   Gram Stain   Final    ABUNDANT WBC PRESENT, PREDOMINANTLY PMN NO ORGANISMS SEEN Performed at [facility], [address], [place], [place] [address]    Culture   Final    RARE STAPHYLOCOCCUS AUREUS NO ANAEROBES ISOLATED; CULTURE IN PROGRESS FOR 5 DAYS    Report Status PENDING  Incomplete   Organism ID, Bacteria STAPHYLOCOCCUS AUREUS  Final      Susceptibility   Staphylococcus aureus - MIC*    CIPROFLOXACIN <=0.5 SENSITIVE Sensitive     ERYTHROMYCIN <=0.25 SENSITIVE Sensitive     GENTAMICIN <=0.5 SENSITIVE Sensitive     OXACILLIN 0.5 SENSITIVE Sensitive     TETRACYCLINE <=1 SENSITIVE Sensitive     VANCOMYCIN 1 SENSITIVE Sensitive     TRIMETH/SULFA <=10 SENSITIVE Sensitive     CLINDAMYCIN <=0.25 SENSITIVE Sensitive     RIFAMPIN <=0.5 SENSITIVE Sensitive     Inducible Clindamycin NEGATIVE Sensitive     LINEZOLID 1 SENSITIVE Sensitive     * RARE STAPHYLOCOCCUS AUREUS  Acid Fast Smear (AFB)     Status: None   Collection Time: 11/09/22  7:40 AM   Specimen: PATH Cytology Misc. fluid; Body Fluid  Result Value Ref Range Status   AFB Specimen Processing Concentration  Final   Acid Fast Smear Negative  Final    Comment: (NOTE) Performed At: [facility] [address]  [place], [place] [id number] Jolene Schimke MD MW:[id number]    Source (AFB) SYNOVIAL  Final    Comment: Performed at [facility], [address] [place], [place] [address]  Fungus Culture Result     Status: None   Collection Time: 11/09/22  7:40 AM  Result Value Ref Range Status   Result 1 Comment  Final    Comment: (NOTE) KOH/Calcofluor preparation:  no fungus observed. Performed At: [facility] [address] [place], [place] [id number] Jolene Schimke MD VZ:[id number]      Medications:    amitriptyline  25 mg Oral QHS   amLODipine  10 mg Oral Daily   atorvastatin  40 mg Oral Daily   Chlorhexidine Gluconate Cloth  6 each Topical Q0600   colchicine  0.6 mg Oral BID   enoxaparin (LOVENOX) injection  40 mg Subcutaneous Daily   feeding supplement (GLUCERNA SHAKE)  237 mL Oral TID BM   insulin aspart  0-15 Units Subcutaneous TID WC   insulin aspart  0-5 Units Subcutaneous QHS   insulin glargine-yfgn  5 Units Subcutaneous BID   metoprolol tartrate  25 mg Oral BID   mupirocin ointment  1 Application Nasal BID   pantoprazole  40 mg Oral Daily   polyethylene glycol  17 g Oral BID   tamsulosin  0.4 mg Oral QPC supper   Continuous Infusions:   ceFAZolin (ANCEF) IV Stopped (11/13/22 0545)      LOS: 8 days   Marinda Elk  [facility]  11/13/2022, 9:44 AM

## 2022-11-14 DIAGNOSIS — M00032 Staphylococcal arthritis, left wrist: Secondary | ICD-10-CM | POA: Diagnosis not present

## 2022-11-14 DIAGNOSIS — R601 Generalized edema: Secondary | ICD-10-CM | POA: Diagnosis not present

## 2022-11-14 DIAGNOSIS — R7881 Bacteremia: Secondary | ICD-10-CM | POA: Diagnosis not present

## 2022-11-14 DIAGNOSIS — M79645 Pain in left finger(s): Secondary | ICD-10-CM | POA: Diagnosis not present

## 2022-11-14 DIAGNOSIS — M51379 Other intervertebral disc degeneration, lumbosacral region without mention of lumbar back pain or lower extremity pain: Secondary | ICD-10-CM | POA: Diagnosis not present

## 2022-11-14 DIAGNOSIS — M71161 Other infective bursitis, right knee: Secondary | ICD-10-CM | POA: Diagnosis not present

## 2022-11-14 DIAGNOSIS — M1711 Unilateral primary osteoarthritis, right knee: Secondary | ICD-10-CM | POA: Diagnosis not present

## 2022-11-14 DIAGNOSIS — M00832 Arthritis due to other bacteria, left wrist: Secondary | ICD-10-CM | POA: Diagnosis not present

## 2022-11-14 DIAGNOSIS — M542 Cervicalgia: Secondary | ICD-10-CM | POA: Diagnosis not present

## 2022-11-14 DIAGNOSIS — I10 Essential (primary) hypertension: Secondary | ICD-10-CM | POA: Diagnosis not present

## 2022-11-14 DIAGNOSIS — M19032 Primary osteoarthritis, left wrist: Secondary | ICD-10-CM | POA: Diagnosis not present

## 2022-11-14 DIAGNOSIS — I4891 Unspecified atrial fibrillation: Secondary | ICD-10-CM | POA: Diagnosis not present

## 2022-11-14 DIAGNOSIS — M00839 Arthritis due to other bacteria, unspecified wrist: Secondary | ICD-10-CM | POA: Diagnosis not present

## 2022-11-14 DIAGNOSIS — K861 Other chronic pancreatitis: Secondary | ICD-10-CM | POA: Diagnosis not present

## 2022-11-14 DIAGNOSIS — M0080 Arthritis due to other bacteria, unspecified joint: Secondary | ICD-10-CM | POA: Diagnosis not present

## 2022-11-14 DIAGNOSIS — M25461 Effusion, right knee: Secondary | ICD-10-CM | POA: Diagnosis not present

## 2022-11-14 DIAGNOSIS — B9689 Other specified bacterial agents as the cause of diseases classified elsewhere: Secondary | ICD-10-CM | POA: Diagnosis not present

## 2022-11-14 DIAGNOSIS — M00061 Staphylococcal arthritis, right knee: Secondary | ICD-10-CM | POA: Diagnosis not present

## 2022-11-14 DIAGNOSIS — M25462 Effusion, left knee: Secondary | ICD-10-CM | POA: Diagnosis not present

## 2022-11-14 DIAGNOSIS — J449 Chronic obstructive pulmonary disease, unspecified: Secondary | ICD-10-CM | POA: Diagnosis not present

## 2022-11-14 DIAGNOSIS — Z7401 Bed confinement status: Secondary | ICD-10-CM | POA: Diagnosis not present

## 2022-11-14 DIAGNOSIS — R531 Weakness: Secondary | ICD-10-CM | POA: Diagnosis not present

## 2022-11-14 DIAGNOSIS — B9561 Methicillin susceptible Staphylococcus aureus infection as the cause of diseases classified elsewhere: Secondary | ICD-10-CM | POA: Diagnosis not present

## 2022-11-14 DIAGNOSIS — S60421D Blister (nonthermal) of left index finger, subsequent encounter: Secondary | ICD-10-CM | POA: Diagnosis not present

## 2022-11-14 DIAGNOSIS — M25532 Pain in left wrist: Secondary | ICD-10-CM | POA: Diagnosis not present

## 2022-11-14 DIAGNOSIS — R55 Syncope and collapse: Secondary | ICD-10-CM | POA: Diagnosis not present

## 2022-11-14 DIAGNOSIS — M00869 Arthritis due to other bacteria, unspecified knee: Secondary | ICD-10-CM | POA: Diagnosis not present

## 2022-11-14 DIAGNOSIS — E119 Type 2 diabetes mellitus without complications: Secondary | ICD-10-CM | POA: Diagnosis not present

## 2022-11-14 DIAGNOSIS — F1120 Opioid dependence, uncomplicated: Secondary | ICD-10-CM | POA: Diagnosis not present

## 2022-11-14 DIAGNOSIS — M25432 Effusion, left wrist: Secondary | ICD-10-CM | POA: Diagnosis not present

## 2022-11-14 DIAGNOSIS — L089 Local infection of the skin and subcutaneous tissue, unspecified: Secondary | ICD-10-CM | POA: Diagnosis not present

## 2022-11-14 DIAGNOSIS — Z7185 Encounter for immunization safety counseling: Secondary | ICD-10-CM | POA: Diagnosis not present

## 2022-11-14 DIAGNOSIS — I7389 Other specified peripheral vascular diseases: Secondary | ICD-10-CM | POA: Diagnosis not present

## 2022-11-14 DIAGNOSIS — Z2809 Immunization not carried out because of other contraindication: Secondary | ICD-10-CM | POA: Diagnosis not present

## 2022-11-14 DIAGNOSIS — M79651 Pain in right thigh: Secondary | ICD-10-CM | POA: Diagnosis not present

## 2022-11-14 DIAGNOSIS — N401 Enlarged prostate with lower urinary tract symptoms: Secondary | ICD-10-CM | POA: Diagnosis not present

## 2022-11-14 DIAGNOSIS — W19XXXD Unspecified fall, subsequent encounter: Secondary | ICD-10-CM | POA: Diagnosis not present

## 2022-11-14 DIAGNOSIS — F5102 Adjustment insomnia: Secondary | ICD-10-CM | POA: Diagnosis not present

## 2022-11-14 LAB — BASIC METABOLIC PANEL
Anion gap: 10 (ref 5–15)
BUN: 21 mg/dL (ref 8–23)
CO2: 25 mmol/L (ref 22–32)
Calcium: 7.8 mg/dL — ABNORMAL LOW (ref 8.9–10.3)
Chloride: 102 mmol/L (ref 98–111)
Creatinine, Ser: 1.28 mg/dL — ABNORMAL HIGH (ref 0.61–1.24)
GFR, Estimated: 58 mL/min — ABNORMAL LOW (ref >60)
Glucose, Bld: 112 mg/dL — ABNORMAL HIGH (ref 70–99)
Potassium: 3.8 mmol/L (ref 3.5–5.1)
Sodium: 137 mmol/L (ref 135–145)

## 2022-11-14 LAB — AEROBIC/ANAEROBIC CULTURE W GRAM STAIN (SURGICAL/DEEP WOUND)

## 2022-11-14 LAB — GLUCOSE, CAPILLARY: Glucose-Capillary: 172 mg/dL — ABNORMAL HIGH (ref 70–99)

## 2022-11-14 MED ORDER — ATORVASTATIN CALCIUM 40 MG PO TABS: 40.0000 mg | ORAL_TABLET | Freq: Every day | ORAL | 0 refills | Status: DC

## 2022-11-14 MED ORDER — CEFAZOLIN IV (FOR PTA / DISCHARGE USE ONLY): 2.0000 g | Freq: Three times a day (TID) | INTRAVENOUS | 0 refills | Status: AC

## 2022-11-14 MED ORDER — METFORMIN HCL 500 MG PO TABS: 500.0000 mg | ORAL_TABLET | Freq: Two times a day (BID) | ORAL | 0 refills | Status: DC

## 2022-11-14 MED ORDER — METOPROLOL TARTRATE 25 MG PO TABS: 25.0000 mg | ORAL_TABLET | Freq: Two times a day (BID) | ORAL | Status: DC

## 2022-11-14 MED ORDER — CHLORHEXIDINE GLUCONATE CLOTH 2 % EX PADS
6.0000 | MEDICATED_PAD | Freq: Every day | CUTANEOUS | Status: DC
  Administered 2022-11-14: 6 via TOPICAL

## 2022-11-14 MED ORDER — METFORMIN HCL 500 MG PO TABS
500.0000 mg | ORAL_TABLET | Freq: Two times a day (BID) | ORAL | Status: DC
  Administered 2022-11-14: 500 mg via ORAL
  Filled 2022-11-14: qty 1

## 2022-11-14 MED ORDER — OXYCODONE HCL 10 MG PO TABS: 10.0000 mg | ORAL_TABLET | Freq: Three times a day (TID) | ORAL | 0 refills | Status: DC | PRN

## 2022-11-14 NOTE — Discharge Summary (Addendum)
Physician Discharge Summary  Michael Bender QMV:[id number] DOB: Jul 26, 1945 DOA: 11/05/2022  PCP: Lula Olszewski, MD  Admit date: 11/05/2022 Discharge date: 11/14/2022  Admitted From: Home Disposition:  SNF  Recommendations for Outpatient Follow-up:  Follow up with PCP in 1-2 weeks Please obtain BMP/CBC in one week   Home Health:no Equipment/Devices:None  Discharge Condition:Stable CODE STATUS:Full Diet recommendation: Heart Healthy   Brief/Interim Summary: 77 y.o. male past medical history significant peripheral vascular disease, essential hypertension, history of orthostatic hypotension recently discharged from the hospital on 11/02/2022 due to multiple falls, traumatic rhabdomyolysis acute kidney injury acute urinary retention due to bladder outlet obstruction and BPH, was offered skilled nursing facility but declined went home with PT came in with recurrent syncopal episode associated with generalized weakness found to to have MSSA bacteremia on blood cultures on 11/05/2022 and septic arthritis. ID was consulted recommended IV cefazolin surveillance blood culture on 11/07/2022 have been negative till date.   Discharge Diagnoses:  Principal Problem:   MSSA bacteremia Active Problems:   Syncope   Septic infrapatellar bursitis of right knee   Staphylococcal arthritis of left wrist (HCC)   Infected blister of left index finger   Staphylococcal arthritis of right knee (HCC)   Neck pain   Finger pain, left   Septic arthritis of wrist, left (HCC)   Cervical discitis   Hardware complicating wound infection (HCC)  Septic left wrist arthritis discitis of C5-C6/MSSA bacteremia: Started empirically on antibiotics blood cultures grew MSSA surveillance blood culture on 11/07/2022 have been negative till date. ID was consulted and MRI of the left wrist was done was concerning for septic arthritis orthopedic surgery was consulted and status post left wrist capsulotomy and washout on  11/09/2022. MRI of the C-spine was done that showed discitis of C5-C6. PICC line was inserted on 11/13/2022. He will continue antibiotics for 6 weeks end date on 12/18/2022.  Acute metabolic encephalopathy: Likely due to infectious etiology, upon discharge it was resolved.  Syncope: Likely due to orthostatic hypotension: PT OT evaluated the patient will need skilled nursing facility.  Right hand pain: There were no active infection due to inflammatory changes. Was started on colchicine and pain improved orthopedic agree with management.  Elevated troponins: Likely demand ischemia.  New onset A-fib with RVR: Was started on IV diltiazem now switched to oral metoprolol is not a candidate for anticoagulation due to recurrent falls. Follow-up with cardiology as an outpatient.  Hyperglycemia/newly diagnosed diabetes mellitus type 2: A1c of 6.5 he was started on sliding scale insulin and long-acting insulin in the hospital. He was transition to oral metformin which she will continue as an outpatient.  Hypokalemia/hypomagnesemia: Repleted now improved.  Acute kidney injury: Likely prerenal azotemia resolved with IV fluid resuscitation. Continue Flomax.  Essential hypertension: Blood pressure was elevated he we will continue Norvasc daily.  Physical debility: Will need to go to skilled nursing facility.    Discharge Instructions  Discharge Instructions     [facility] pharmacist to adjust dose for Vancomycin, Aminoglycosides and other anti-infective therapies as requested by physician.   Complete by: As directed    [facility] to provide Cath Flo 2mg    Complete by: As directed    Administer for PICC line occlusion and as ordered by physician for other access device issues.   Anaphylaxis Kit: Provided to treat any anaphylactic reaction to the medication being provided to the patient if First Dose or when requested by physician   Complete by: As directed  Physician Discharge Summary  Michael Bender QMV:[id number] DOB: Jul 26, 1945 DOA: 11/05/2022  PCP: Lula Olszewski, MD  Admit date: 11/05/2022 Discharge date: 11/14/2022  Admitted From: Home Disposition:  SNF  Recommendations for Outpatient Follow-up:  Follow up with PCP in 1-2 weeks Please obtain BMP/CBC in one week   Home Health:no Equipment/Devices:None  Discharge Condition:Stable CODE STATUS:Full Diet recommendation: Heart Healthy   Brief/Interim Summary: 77 y.o. male past medical history significant peripheral vascular disease, essential hypertension, history of orthostatic hypotension recently discharged from the hospital on 11/02/2022 due to multiple falls, traumatic rhabdomyolysis acute kidney injury acute urinary retention due to bladder outlet obstruction and BPH, was offered skilled nursing facility but declined went home with PT came in with recurrent syncopal episode associated with generalized weakness found to to have MSSA bacteremia on blood cultures on 11/05/2022 and septic arthritis. ID was consulted recommended IV cefazolin surveillance blood culture on 11/07/2022 have been negative till date.   Discharge Diagnoses:  Principal Problem:   MSSA bacteremia Active Problems:   Syncope   Septic infrapatellar bursitis of right knee   Staphylococcal arthritis of left wrist (HCC)   Infected blister of left index finger   Staphylococcal arthritis of right knee (HCC)   Neck pain   Finger pain, left   Septic arthritis of wrist, left (HCC)   Cervical discitis   Hardware complicating wound infection (HCC)  Septic left wrist arthritis discitis of C5-C6/MSSA bacteremia: Started empirically on antibiotics blood cultures grew MSSA surveillance blood culture on 11/07/2022 have been negative till date. ID was consulted and MRI of the left wrist was done was concerning for septic arthritis orthopedic surgery was consulted and status post left wrist capsulotomy and washout on  11/09/2022. MRI of the C-spine was done that showed discitis of C5-C6. PICC line was inserted on 11/13/2022. He will continue antibiotics for 6 weeks end date on 12/18/2022.  Acute metabolic encephalopathy: Likely due to infectious etiology, upon discharge it was resolved.  Syncope: Likely due to orthostatic hypotension: PT OT evaluated the patient will need skilled nursing facility.  Right hand pain: There were no active infection due to inflammatory changes. Was started on colchicine and pain improved orthopedic agree with management.  Elevated troponins: Likely demand ischemia.  New onset A-fib with RVR: Was started on IV diltiazem now switched to oral metoprolol is not a candidate for anticoagulation due to recurrent falls. Follow-up with cardiology as an outpatient.  Hyperglycemia/newly diagnosed diabetes mellitus type 2: A1c of 6.5 he was started on sliding scale insulin and long-acting insulin in the hospital. He was transition to oral metformin which she will continue as an outpatient.  Hypokalemia/hypomagnesemia: Repleted now improved.  Acute kidney injury: Likely prerenal azotemia resolved with IV fluid resuscitation. Continue Flomax.  Essential hypertension: Blood pressure was elevated he we will continue Norvasc daily.  Physical debility: Will need to go to skilled nursing facility.    Discharge Instructions  Discharge Instructions     [facility] pharmacist to adjust dose for Vancomycin, Aminoglycosides and other anti-infective therapies as requested by physician.   Complete by: As directed    [facility] to provide Cath Flo 2mg    Complete by: As directed    Administer for PICC line occlusion and as ordered by physician for other access device issues.   Anaphylaxis Kit: Provided to treat any anaphylactic reaction to the medication being provided to the patient if First Dose or when requested by physician   Complete by: As directed  Physician Discharge Summary  Michael Bender QMV:[id number] DOB: Jul 26, 1945 DOA: 11/05/2022  PCP: Lula Olszewski, MD  Admit date: 11/05/2022 Discharge date: 11/14/2022  Admitted From: Home Disposition:  SNF  Recommendations for Outpatient Follow-up:  Follow up with PCP in 1-2 weeks Please obtain BMP/CBC in one week   Home Health:no Equipment/Devices:None  Discharge Condition:Stable CODE STATUS:Full Diet recommendation: Heart Healthy   Brief/Interim Summary: 77 y.o. male past medical history significant peripheral vascular disease, essential hypertension, history of orthostatic hypotension recently discharged from the hospital on 11/02/2022 due to multiple falls, traumatic rhabdomyolysis acute kidney injury acute urinary retention due to bladder outlet obstruction and BPH, was offered skilled nursing facility but declined went home with PT came in with recurrent syncopal episode associated with generalized weakness found to to have MSSA bacteremia on blood cultures on 11/05/2022 and septic arthritis. ID was consulted recommended IV cefazolin surveillance blood culture on 11/07/2022 have been negative till date.   Discharge Diagnoses:  Principal Problem:   MSSA bacteremia Active Problems:   Syncope   Septic infrapatellar bursitis of right knee   Staphylococcal arthritis of left wrist (HCC)   Infected blister of left index finger   Staphylococcal arthritis of right knee (HCC)   Neck pain   Finger pain, left   Septic arthritis of wrist, left (HCC)   Cervical discitis   Hardware complicating wound infection (HCC)  Septic left wrist arthritis discitis of C5-C6/MSSA bacteremia: Started empirically on antibiotics blood cultures grew MSSA surveillance blood culture on 11/07/2022 have been negative till date. ID was consulted and MRI of the left wrist was done was concerning for septic arthritis orthopedic surgery was consulted and status post left wrist capsulotomy and washout on  11/09/2022. MRI of the C-spine was done that showed discitis of C5-C6. PICC line was inserted on 11/13/2022. He will continue antibiotics for 6 weeks end date on 12/18/2022.  Acute metabolic encephalopathy: Likely due to infectious etiology, upon discharge it was resolved.  Syncope: Likely due to orthostatic hypotension: PT OT evaluated the patient will need skilled nursing facility.  Right hand pain: There were no active infection due to inflammatory changes. Was started on colchicine and pain improved orthopedic agree with management.  Elevated troponins: Likely demand ischemia.  New onset A-fib with RVR: Was started on IV diltiazem now switched to oral metoprolol is not a candidate for anticoagulation due to recurrent falls. Follow-up with cardiology as an outpatient.  Hyperglycemia/newly diagnosed diabetes mellitus type 2: A1c of 6.5 he was started on sliding scale insulin and long-acting insulin in the hospital. He was transition to oral metformin which she will continue as an outpatient.  Hypokalemia/hypomagnesemia: Repleted now improved.  Acute kidney injury: Likely prerenal azotemia resolved with IV fluid resuscitation. Continue Flomax.  Essential hypertension: Blood pressure was elevated he we will continue Norvasc daily.  Physical debility: Will need to go to skilled nursing facility.    Discharge Instructions  Discharge Instructions     [facility] pharmacist to adjust dose for Vancomycin, Aminoglycosides and other anti-infective therapies as requested by physician.   Complete by: As directed    [facility] to provide Cath Flo 2mg    Complete by: As directed    Administer for PICC line occlusion and as ordered by physician for other access device issues.   Anaphylaxis Kit: Provided to treat any anaphylactic reaction to the medication being provided to the patient if First Dose or when requested by physician   Complete by: As directed  Joint: Moderate to large knee effusion. Only thin synovial enhancement. This is unlikely to be a septic joint given the thin nature of the synovial enhancement. Thin medial plica noted. Popliteal Fossa:  Unremarkable Extensor Mechanism: Possible focal central tendinopathy in the distal patellar tendon  on image 13 series 7. Bones: 1.0 by 1.4 cm non-fragmented osteochondral lesion versus subcortical stress fracture anteriorly in the medial femoral condyle with surrounding marrow edema as on image 13 series 11 Other: No supplemental non-categorized findings. IMPRESSION: 1. Moderate to large knee effusion, but with only thin synovial enhancement. This is unlikely to be a septic joint given the thin nature of the synovial enhancement. 2. Non-fragmented osteochondral lesion versus subcortical stress fracture anteriorly in the medial femoral condyle with surrounding marrow edema. 3. Degenerative tearing of the posterior horn medial meniscus likely extending into the meniscal root. 4. Prominent expansion of the posterior horn lateral meniscus extending towards the meniscal root suggesting prominent degeneration. 5. Moderate to severe chondral thinning in the patellofemoral compartment with mild chondral thinning in the medial and lateral compartments. 6. Possible focal central tendinopathy in the distal patellar tendon. Electronically Signed   By: Gaylyn Rong M.D.   On: 11/07/2022 18:54   ECHOCARDIOGRAM COMPLETE  Result Date: 11/07/2022    ECHOCARDIOGRAM REPORT   Patient Name:   Michael Bender Date of Exam: 11/07/2022 Medical Rec #:  [id number]        Height:       71.0 in Accession #:    [id number]       Weight:       134.9 lb Date of Birth:  [birth year]       BSA:          1.784 m Patient Age:    76 years         BP:           139/76 mmHg Patient Gender: M                HR:           115 bpm. Exam Location:  Inpatient Procedure: 2D Echo, Cardiac Doppler and Color Doppler Indications:    Syncope R55  History:        Patient has no prior history of Echocardiogram examinations.                 COPD; Risk Factors:Hypertension and Dyslipidemia.  Sonographer:    Harriette Bouillon RDCS Referring Phys: [id number] CAROLE N HALL IMPRESSIONS  1. Left ventricular ejection fraction, by estimation, is 60 to 65%. The left  ventricle has normal function. The left ventricle has no regional wall motion abnormalities. Left ventricular diastolic parameters are consistent with Grade II diastolic dysfunction (pseudonormalization).  2. Right ventricular systolic function is normal. The right ventricular size is normal. There is normal pulmonary artery systolic pressure.  3. The mitral valve is normal in structure. No evidence of mitral valve regurgitation. No evidence of mitral stenosis.  4. The aortic valve is tricuspid. Aortic valve regurgitation is not visualized. No aortic stenosis is present.  5. The inferior vena cava is normal in size with greater than 50% respiratory variability, suggesting right atrial pressure of 3 mmHg. FINDINGS  Left Ventricle: Left ventricular ejection fraction, by estimation, is 60 to 65%. The left ventricle has normal function. The left ventricle has no regional wall motion abnormalities. The left ventricular internal cavity size was normal in size. There is  no left ventricular hypertrophy. Left ventricular diastolic parameters  Joint: Moderate to large knee effusion. Only thin synovial enhancement. This is unlikely to be a septic joint given the thin nature of the synovial enhancement. Thin medial plica noted. Popliteal Fossa:  Unremarkable Extensor Mechanism: Possible focal central tendinopathy in the distal patellar tendon  on image 13 series 7. Bones: 1.0 by 1.4 cm non-fragmented osteochondral lesion versus subcortical stress fracture anteriorly in the medial femoral condyle with surrounding marrow edema as on image 13 series 11 Other: No supplemental non-categorized findings. IMPRESSION: 1. Moderate to large knee effusion, but with only thin synovial enhancement. This is unlikely to be a septic joint given the thin nature of the synovial enhancement. 2. Non-fragmented osteochondral lesion versus subcortical stress fracture anteriorly in the medial femoral condyle with surrounding marrow edema. 3. Degenerative tearing of the posterior horn medial meniscus likely extending into the meniscal root. 4. Prominent expansion of the posterior horn lateral meniscus extending towards the meniscal root suggesting prominent degeneration. 5. Moderate to severe chondral thinning in the patellofemoral compartment with mild chondral thinning in the medial and lateral compartments. 6. Possible focal central tendinopathy in the distal patellar tendon. Electronically Signed   By: Gaylyn Rong M.D.   On: 11/07/2022 18:54   ECHOCARDIOGRAM COMPLETE  Result Date: 11/07/2022    ECHOCARDIOGRAM REPORT   Patient Name:   Michael Bender Date of Exam: 11/07/2022 Medical Rec #:  [id number]        Height:       71.0 in Accession #:    [id number]       Weight:       134.9 lb Date of Birth:  [birth year]       BSA:          1.784 m Patient Age:    76 years         BP:           139/76 mmHg Patient Gender: M                HR:           115 bpm. Exam Location:  Inpatient Procedure: 2D Echo, Cardiac Doppler and Color Doppler Indications:    Syncope R55  History:        Patient has no prior history of Echocardiogram examinations.                 COPD; Risk Factors:Hypertension and Dyslipidemia.  Sonographer:    Harriette Bouillon RDCS Referring Phys: [id number] CAROLE N HALL IMPRESSIONS  1. Left ventricular ejection fraction, by estimation, is 60 to 65%. The left  ventricle has normal function. The left ventricle has no regional wall motion abnormalities. Left ventricular diastolic parameters are consistent with Grade II diastolic dysfunction (pseudonormalization).  2. Right ventricular systolic function is normal. The right ventricular size is normal. There is normal pulmonary artery systolic pressure.  3. The mitral valve is normal in structure. No evidence of mitral valve regurgitation. No evidence of mitral stenosis.  4. The aortic valve is tricuspid. Aortic valve regurgitation is not visualized. No aortic stenosis is present.  5. The inferior vena cava is normal in size with greater than 50% respiratory variability, suggesting right atrial pressure of 3 mmHg. FINDINGS  Left Ventricle: Left ventricular ejection fraction, by estimation, is 60 to 65%. The left ventricle has normal function. The left ventricle has no regional wall motion abnormalities. The left ventricular internal cavity size was normal in size. There is  no left ventricular hypertrophy. Left ventricular diastolic parameters  Joint: Moderate to large knee effusion. Only thin synovial enhancement. This is unlikely to be a septic joint given the thin nature of the synovial enhancement. Thin medial plica noted. Popliteal Fossa:  Unremarkable Extensor Mechanism: Possible focal central tendinopathy in the distal patellar tendon  on image 13 series 7. Bones: 1.0 by 1.4 cm non-fragmented osteochondral lesion versus subcortical stress fracture anteriorly in the medial femoral condyle with surrounding marrow edema as on image 13 series 11 Other: No supplemental non-categorized findings. IMPRESSION: 1. Moderate to large knee effusion, but with only thin synovial enhancement. This is unlikely to be a septic joint given the thin nature of the synovial enhancement. 2. Non-fragmented osteochondral lesion versus subcortical stress fracture anteriorly in the medial femoral condyle with surrounding marrow edema. 3. Degenerative tearing of the posterior horn medial meniscus likely extending into the meniscal root. 4. Prominent expansion of the posterior horn lateral meniscus extending towards the meniscal root suggesting prominent degeneration. 5. Moderate to severe chondral thinning in the patellofemoral compartment with mild chondral thinning in the medial and lateral compartments. 6. Possible focal central tendinopathy in the distal patellar tendon. Electronically Signed   By: Gaylyn Rong M.D.   On: 11/07/2022 18:54   ECHOCARDIOGRAM COMPLETE  Result Date: 11/07/2022    ECHOCARDIOGRAM REPORT   Patient Name:   Michael Bender Date of Exam: 11/07/2022 Medical Rec #:  [id number]        Height:       71.0 in Accession #:    [id number]       Weight:       134.9 lb Date of Birth:  [birth year]       BSA:          1.784 m Patient Age:    76 years         BP:           139/76 mmHg Patient Gender: M                HR:           115 bpm. Exam Location:  Inpatient Procedure: 2D Echo, Cardiac Doppler and Color Doppler Indications:    Syncope R55  History:        Patient has no prior history of Echocardiogram examinations.                 COPD; Risk Factors:Hypertension and Dyslipidemia.  Sonographer:    Harriette Bouillon RDCS Referring Phys: [id number] CAROLE N HALL IMPRESSIONS  1. Left ventricular ejection fraction, by estimation, is 60 to 65%. The left  ventricle has normal function. The left ventricle has no regional wall motion abnormalities. Left ventricular diastolic parameters are consistent with Grade II diastolic dysfunction (pseudonormalization).  2. Right ventricular systolic function is normal. The right ventricular size is normal. There is normal pulmonary artery systolic pressure.  3. The mitral valve is normal in structure. No evidence of mitral valve regurgitation. No evidence of mitral stenosis.  4. The aortic valve is tricuspid. Aortic valve regurgitation is not visualized. No aortic stenosis is present.  5. The inferior vena cava is normal in size with greater than 50% respiratory variability, suggesting right atrial pressure of 3 mmHg. FINDINGS  Left Ventricle: Left ventricular ejection fraction, by estimation, is 60 to 65%. The left ventricle has normal function. The left ventricle has no regional wall motion abnormalities. The left ventricular internal cavity size was normal in size. There is  no left ventricular hypertrophy. Left ventricular diastolic parameters  Joint: Moderate to large knee effusion. Only thin synovial enhancement. This is unlikely to be a septic joint given the thin nature of the synovial enhancement. Thin medial plica noted. Popliteal Fossa:  Unremarkable Extensor Mechanism: Possible focal central tendinopathy in the distal patellar tendon  on image 13 series 7. Bones: 1.0 by 1.4 cm non-fragmented osteochondral lesion versus subcortical stress fracture anteriorly in the medial femoral condyle with surrounding marrow edema as on image 13 series 11 Other: No supplemental non-categorized findings. IMPRESSION: 1. Moderate to large knee effusion, but with only thin synovial enhancement. This is unlikely to be a septic joint given the thin nature of the synovial enhancement. 2. Non-fragmented osteochondral lesion versus subcortical stress fracture anteriorly in the medial femoral condyle with surrounding marrow edema. 3. Degenerative tearing of the posterior horn medial meniscus likely extending into the meniscal root. 4. Prominent expansion of the posterior horn lateral meniscus extending towards the meniscal root suggesting prominent degeneration. 5. Moderate to severe chondral thinning in the patellofemoral compartment with mild chondral thinning in the medial and lateral compartments. 6. Possible focal central tendinopathy in the distal patellar tendon. Electronically Signed   By: Gaylyn Rong M.D.   On: 11/07/2022 18:54   ECHOCARDIOGRAM COMPLETE  Result Date: 11/07/2022    ECHOCARDIOGRAM REPORT   Patient Name:   Michael Bender Date of Exam: 11/07/2022 Medical Rec #:  [id number]        Height:       71.0 in Accession #:    [id number]       Weight:       134.9 lb Date of Birth:  [birth year]       BSA:          1.784 m Patient Age:    76 years         BP:           139/76 mmHg Patient Gender: M                HR:           115 bpm. Exam Location:  Inpatient Procedure: 2D Echo, Cardiac Doppler and Color Doppler Indications:    Syncope R55  History:        Patient has no prior history of Echocardiogram examinations.                 COPD; Risk Factors:Hypertension and Dyslipidemia.  Sonographer:    Harriette Bouillon RDCS Referring Phys: [id number] CAROLE N HALL IMPRESSIONS  1. Left ventricular ejection fraction, by estimation, is 60 to 65%. The left  ventricle has normal function. The left ventricle has no regional wall motion abnormalities. Left ventricular diastolic parameters are consistent with Grade II diastolic dysfunction (pseudonormalization).  2. Right ventricular systolic function is normal. The right ventricular size is normal. There is normal pulmonary artery systolic pressure.  3. The mitral valve is normal in structure. No evidence of mitral valve regurgitation. No evidence of mitral stenosis.  4. The aortic valve is tricuspid. Aortic valve regurgitation is not visualized. No aortic stenosis is present.  5. The inferior vena cava is normal in size with greater than 50% respiratory variability, suggesting right atrial pressure of 3 mmHg. FINDINGS  Left Ventricle: Left ventricular ejection fraction, by estimation, is 60 to 65%. The left ventricle has normal function. The left ventricle has no regional wall motion abnormalities. The left ventricular internal cavity size was normal in size. There is  no left ventricular hypertrophy. Left ventricular diastolic parameters  Joint: Moderate to large knee effusion. Only thin synovial enhancement. This is unlikely to be a septic joint given the thin nature of the synovial enhancement. Thin medial plica noted. Popliteal Fossa:  Unremarkable Extensor Mechanism: Possible focal central tendinopathy in the distal patellar tendon  on image 13 series 7. Bones: 1.0 by 1.4 cm non-fragmented osteochondral lesion versus subcortical stress fracture anteriorly in the medial femoral condyle with surrounding marrow edema as on image 13 series 11 Other: No supplemental non-categorized findings. IMPRESSION: 1. Moderate to large knee effusion, but with only thin synovial enhancement. This is unlikely to be a septic joint given the thin nature of the synovial enhancement. 2. Non-fragmented osteochondral lesion versus subcortical stress fracture anteriorly in the medial femoral condyle with surrounding marrow edema. 3. Degenerative tearing of the posterior horn medial meniscus likely extending into the meniscal root. 4. Prominent expansion of the posterior horn lateral meniscus extending towards the meniscal root suggesting prominent degeneration. 5. Moderate to severe chondral thinning in the patellofemoral compartment with mild chondral thinning in the medial and lateral compartments. 6. Possible focal central tendinopathy in the distal patellar tendon. Electronically Signed   By: Gaylyn Rong M.D.   On: 11/07/2022 18:54   ECHOCARDIOGRAM COMPLETE  Result Date: 11/07/2022    ECHOCARDIOGRAM REPORT   Patient Name:   Michael Bender Date of Exam: 11/07/2022 Medical Rec #:  [id number]        Height:       71.0 in Accession #:    [id number]       Weight:       134.9 lb Date of Birth:  [birth year]       BSA:          1.784 m Patient Age:    76 years         BP:           139/76 mmHg Patient Gender: M                HR:           115 bpm. Exam Location:  Inpatient Procedure: 2D Echo, Cardiac Doppler and Color Doppler Indications:    Syncope R55  History:        Patient has no prior history of Echocardiogram examinations.                 COPD; Risk Factors:Hypertension and Dyslipidemia.  Sonographer:    Harriette Bouillon RDCS Referring Phys: [id number] CAROLE N HALL IMPRESSIONS  1. Left ventricular ejection fraction, by estimation, is 60 to 65%. The left  ventricle has normal function. The left ventricle has no regional wall motion abnormalities. Left ventricular diastolic parameters are consistent with Grade II diastolic dysfunction (pseudonormalization).  2. Right ventricular systolic function is normal. The right ventricular size is normal. There is normal pulmonary artery systolic pressure.  3. The mitral valve is normal in structure. No evidence of mitral valve regurgitation. No evidence of mitral stenosis.  4. The aortic valve is tricuspid. Aortic valve regurgitation is not visualized. No aortic stenosis is present.  5. The inferior vena cava is normal in size with greater than 50% respiratory variability, suggesting right atrial pressure of 3 mmHg. FINDINGS  Left Ventricle: Left ventricular ejection fraction, by estimation, is 60 to 65%. The left ventricle has normal function. The left ventricle has no regional wall motion abnormalities. The left ventricular internal cavity size was normal in size. There is  no left ventricular hypertrophy. Left ventricular diastolic parameters  Physician Discharge Summary  Michael Bender QMV:[id number] DOB: Jul 26, 1945 DOA: 11/05/2022  PCP: Lula Olszewski, MD  Admit date: 11/05/2022 Discharge date: 11/14/2022  Admitted From: Home Disposition:  SNF  Recommendations for Outpatient Follow-up:  Follow up with PCP in 1-2 weeks Please obtain BMP/CBC in one week   Home Health:no Equipment/Devices:None  Discharge Condition:Stable CODE STATUS:Full Diet recommendation: Heart Healthy   Brief/Interim Summary: 77 y.o. male past medical history significant peripheral vascular disease, essential hypertension, history of orthostatic hypotension recently discharged from the hospital on 11/02/2022 due to multiple falls, traumatic rhabdomyolysis acute kidney injury acute urinary retention due to bladder outlet obstruction and BPH, was offered skilled nursing facility but declined went home with PT came in with recurrent syncopal episode associated with generalized weakness found to to have MSSA bacteremia on blood cultures on 11/05/2022 and septic arthritis. ID was consulted recommended IV cefazolin surveillance blood culture on 11/07/2022 have been negative till date.   Discharge Diagnoses:  Principal Problem:   MSSA bacteremia Active Problems:   Syncope   Septic infrapatellar bursitis of right knee   Staphylococcal arthritis of left wrist (HCC)   Infected blister of left index finger   Staphylococcal arthritis of right knee (HCC)   Neck pain   Finger pain, left   Septic arthritis of wrist, left (HCC)   Cervical discitis   Hardware complicating wound infection (HCC)  Septic left wrist arthritis discitis of C5-C6/MSSA bacteremia: Started empirically on antibiotics blood cultures grew MSSA surveillance blood culture on 11/07/2022 have been negative till date. ID was consulted and MRI of the left wrist was done was concerning for septic arthritis orthopedic surgery was consulted and status post left wrist capsulotomy and washout on  11/09/2022. MRI of the C-spine was done that showed discitis of C5-C6. PICC line was inserted on 11/13/2022. He will continue antibiotics for 6 weeks end date on 12/18/2022.  Acute metabolic encephalopathy: Likely due to infectious etiology, upon discharge it was resolved.  Syncope: Likely due to orthostatic hypotension: PT OT evaluated the patient will need skilled nursing facility.  Right hand pain: There were no active infection due to inflammatory changes. Was started on colchicine and pain improved orthopedic agree with management.  Elevated troponins: Likely demand ischemia.  New onset A-fib with RVR: Was started on IV diltiazem now switched to oral metoprolol is not a candidate for anticoagulation due to recurrent falls. Follow-up with cardiology as an outpatient.  Hyperglycemia/newly diagnosed diabetes mellitus type 2: A1c of 6.5 he was started on sliding scale insulin and long-acting insulin in the hospital. He was transition to oral metformin which she will continue as an outpatient.  Hypokalemia/hypomagnesemia: Repleted now improved.  Acute kidney injury: Likely prerenal azotemia resolved with IV fluid resuscitation. Continue Flomax.  Essential hypertension: Blood pressure was elevated he we will continue Norvasc daily.  Physical debility: Will need to go to skilled nursing facility.    Discharge Instructions  Discharge Instructions     [facility] pharmacist to adjust dose for Vancomycin, Aminoglycosides and other anti-infective therapies as requested by physician.   Complete by: As directed    [facility] to provide Cath Flo 2mg    Complete by: As directed    Administer for PICC line occlusion and as ordered by physician for other access device issues.   Anaphylaxis Kit: Provided to treat any anaphylactic reaction to the medication being provided to the patient if First Dose or when requested by physician   Complete by: As directed  Joint: Moderate to large knee effusion. Only thin synovial enhancement. This is unlikely to be a septic joint given the thin nature of the synovial enhancement. Thin medial plica noted. Popliteal Fossa:  Unremarkable Extensor Mechanism: Possible focal central tendinopathy in the distal patellar tendon  on image 13 series 7. Bones: 1.0 by 1.4 cm non-fragmented osteochondral lesion versus subcortical stress fracture anteriorly in the medial femoral condyle with surrounding marrow edema as on image 13 series 11 Other: No supplemental non-categorized findings. IMPRESSION: 1. Moderate to large knee effusion, but with only thin synovial enhancement. This is unlikely to be a septic joint given the thin nature of the synovial enhancement. 2. Non-fragmented osteochondral lesion versus subcortical stress fracture anteriorly in the medial femoral condyle with surrounding marrow edema. 3. Degenerative tearing of the posterior horn medial meniscus likely extending into the meniscal root. 4. Prominent expansion of the posterior horn lateral meniscus extending towards the meniscal root suggesting prominent degeneration. 5. Moderate to severe chondral thinning in the patellofemoral compartment with mild chondral thinning in the medial and lateral compartments. 6. Possible focal central tendinopathy in the distal patellar tendon. Electronically Signed   By: Gaylyn Rong M.D.   On: 11/07/2022 18:54   ECHOCARDIOGRAM COMPLETE  Result Date: 11/07/2022    ECHOCARDIOGRAM REPORT   Patient Name:   Michael Bender Date of Exam: 11/07/2022 Medical Rec #:  [id number]        Height:       71.0 in Accession #:    [id number]       Weight:       134.9 lb Date of Birth:  [birth year]       BSA:          1.784 m Patient Age:    76 years         BP:           139/76 mmHg Patient Gender: M                HR:           115 bpm. Exam Location:  Inpatient Procedure: 2D Echo, Cardiac Doppler and Color Doppler Indications:    Syncope R55  History:        Patient has no prior history of Echocardiogram examinations.                 COPD; Risk Factors:Hypertension and Dyslipidemia.  Sonographer:    Harriette Bouillon RDCS Referring Phys: [id number] CAROLE N HALL IMPRESSIONS  1. Left ventricular ejection fraction, by estimation, is 60 to 65%. The left  ventricle has normal function. The left ventricle has no regional wall motion abnormalities. Left ventricular diastolic parameters are consistent with Grade II diastolic dysfunction (pseudonormalization).  2. Right ventricular systolic function is normal. The right ventricular size is normal. There is normal pulmonary artery systolic pressure.  3. The mitral valve is normal in structure. No evidence of mitral valve regurgitation. No evidence of mitral stenosis.  4. The aortic valve is tricuspid. Aortic valve regurgitation is not visualized. No aortic stenosis is present.  5. The inferior vena cava is normal in size with greater than 50% respiratory variability, suggesting right atrial pressure of 3 mmHg. FINDINGS  Left Ventricle: Left ventricular ejection fraction, by estimation, is 60 to 65%. The left ventricle has normal function. The left ventricle has no regional wall motion abnormalities. The left ventricular internal cavity size was normal in size. There is  no left ventricular hypertrophy. Left ventricular diastolic parameters  Joint: Moderate to large knee effusion. Only thin synovial enhancement. This is unlikely to be a septic joint given the thin nature of the synovial enhancement. Thin medial plica noted. Popliteal Fossa:  Unremarkable Extensor Mechanism: Possible focal central tendinopathy in the distal patellar tendon  on image 13 series 7. Bones: 1.0 by 1.4 cm non-fragmented osteochondral lesion versus subcortical stress fracture anteriorly in the medial femoral condyle with surrounding marrow edema as on image 13 series 11 Other: No supplemental non-categorized findings. IMPRESSION: 1. Moderate to large knee effusion, but with only thin synovial enhancement. This is unlikely to be a septic joint given the thin nature of the synovial enhancement. 2. Non-fragmented osteochondral lesion versus subcortical stress fracture anteriorly in the medial femoral condyle with surrounding marrow edema. 3. Degenerative tearing of the posterior horn medial meniscus likely extending into the meniscal root. 4. Prominent expansion of the posterior horn lateral meniscus extending towards the meniscal root suggesting prominent degeneration. 5. Moderate to severe chondral thinning in the patellofemoral compartment with mild chondral thinning in the medial and lateral compartments. 6. Possible focal central tendinopathy in the distal patellar tendon. Electronically Signed   By: Gaylyn Rong M.D.   On: 11/07/2022 18:54   ECHOCARDIOGRAM COMPLETE  Result Date: 11/07/2022    ECHOCARDIOGRAM REPORT   Patient Name:   Michael Bender Date of Exam: 11/07/2022 Medical Rec #:  [id number]        Height:       71.0 in Accession #:    [id number]       Weight:       134.9 lb Date of Birth:  [birth year]       BSA:          1.784 m Patient Age:    76 years         BP:           139/76 mmHg Patient Gender: M                HR:           115 bpm. Exam Location:  Inpatient Procedure: 2D Echo, Cardiac Doppler and Color Doppler Indications:    Syncope R55  History:        Patient has no prior history of Echocardiogram examinations.                 COPD; Risk Factors:Hypertension and Dyslipidemia.  Sonographer:    Harriette Bouillon RDCS Referring Phys: [id number] CAROLE N HALL IMPRESSIONS  1. Left ventricular ejection fraction, by estimation, is 60 to 65%. The left  ventricle has normal function. The left ventricle has no regional wall motion abnormalities. Left ventricular diastolic parameters are consistent with Grade II diastolic dysfunction (pseudonormalization).  2. Right ventricular systolic function is normal. The right ventricular size is normal. There is normal pulmonary artery systolic pressure.  3. The mitral valve is normal in structure. No evidence of mitral valve regurgitation. No evidence of mitral stenosis.  4. The aortic valve is tricuspid. Aortic valve regurgitation is not visualized. No aortic stenosis is present.  5. The inferior vena cava is normal in size with greater than 50% respiratory variability, suggesting right atrial pressure of 3 mmHg. FINDINGS  Left Ventricle: Left ventricular ejection fraction, by estimation, is 60 to 65%. The left ventricle has normal function. The left ventricle has no regional wall motion abnormalities. The left ventricular internal cavity size was normal in size. There is  no left ventricular hypertrophy. Left ventricular diastolic parameters  Physician Discharge Summary  Michael Bender QMV:[id number] DOB: Jul 26, 1945 DOA: 11/05/2022  PCP: Lula Olszewski, MD  Admit date: 11/05/2022 Discharge date: 11/14/2022  Admitted From: Home Disposition:  SNF  Recommendations for Outpatient Follow-up:  Follow up with PCP in 1-2 weeks Please obtain BMP/CBC in one week   Home Health:no Equipment/Devices:None  Discharge Condition:Stable CODE STATUS:Full Diet recommendation: Heart Healthy   Brief/Interim Summary: 77 y.o. male past medical history significant peripheral vascular disease, essential hypertension, history of orthostatic hypotension recently discharged from the hospital on 11/02/2022 due to multiple falls, traumatic rhabdomyolysis acute kidney injury acute urinary retention due to bladder outlet obstruction and BPH, was offered skilled nursing facility but declined went home with PT came in with recurrent syncopal episode associated with generalized weakness found to to have MSSA bacteremia on blood cultures on 11/05/2022 and septic arthritis. ID was consulted recommended IV cefazolin surveillance blood culture on 11/07/2022 have been negative till date.   Discharge Diagnoses:  Principal Problem:   MSSA bacteremia Active Problems:   Syncope   Septic infrapatellar bursitis of right knee   Staphylococcal arthritis of left wrist (HCC)   Infected blister of left index finger   Staphylococcal arthritis of right knee (HCC)   Neck pain   Finger pain, left   Septic arthritis of wrist, left (HCC)   Cervical discitis   Hardware complicating wound infection (HCC)  Septic left wrist arthritis discitis of C5-C6/MSSA bacteremia: Started empirically on antibiotics blood cultures grew MSSA surveillance blood culture on 11/07/2022 have been negative till date. ID was consulted and MRI of the left wrist was done was concerning for septic arthritis orthopedic surgery was consulted and status post left wrist capsulotomy and washout on  11/09/2022. MRI of the C-spine was done that showed discitis of C5-C6. PICC line was inserted on 11/13/2022. He will continue antibiotics for 6 weeks end date on 12/18/2022.  Acute metabolic encephalopathy: Likely due to infectious etiology, upon discharge it was resolved.  Syncope: Likely due to orthostatic hypotension: PT OT evaluated the patient will need skilled nursing facility.  Right hand pain: There were no active infection due to inflammatory changes. Was started on colchicine and pain improved orthopedic agree with management.  Elevated troponins: Likely demand ischemia.  New onset A-fib with RVR: Was started on IV diltiazem now switched to oral metoprolol is not a candidate for anticoagulation due to recurrent falls. Follow-up with cardiology as an outpatient.  Hyperglycemia/newly diagnosed diabetes mellitus type 2: A1c of 6.5 he was started on sliding scale insulin and long-acting insulin in the hospital. He was transition to oral metformin which she will continue as an outpatient.  Hypokalemia/hypomagnesemia: Repleted now improved.  Acute kidney injury: Likely prerenal azotemia resolved with IV fluid resuscitation. Continue Flomax.  Essential hypertension: Blood pressure was elevated he we will continue Norvasc daily.  Physical debility: Will need to go to skilled nursing facility.    Discharge Instructions  Discharge Instructions     [facility] pharmacist to adjust dose for Vancomycin, Aminoglycosides and other anti-infective therapies as requested by physician.   Complete by: As directed    [facility] to provide Cath Flo 2mg    Complete by: As directed    Administer for PICC line occlusion and as ordered by physician for other access device issues.   Anaphylaxis Kit: Provided to treat any anaphylactic reaction to the medication being provided to the patient if First Dose or when requested by physician   Complete by: As directed  Joint: Moderate to large knee effusion. Only thin synovial enhancement. This is unlikely to be a septic joint given the thin nature of the synovial enhancement. Thin medial plica noted. Popliteal Fossa:  Unremarkable Extensor Mechanism: Possible focal central tendinopathy in the distal patellar tendon  on image 13 series 7. Bones: 1.0 by 1.4 cm non-fragmented osteochondral lesion versus subcortical stress fracture anteriorly in the medial femoral condyle with surrounding marrow edema as on image 13 series 11 Other: No supplemental non-categorized findings. IMPRESSION: 1. Moderate to large knee effusion, but with only thin synovial enhancement. This is unlikely to be a septic joint given the thin nature of the synovial enhancement. 2. Non-fragmented osteochondral lesion versus subcortical stress fracture anteriorly in the medial femoral condyle with surrounding marrow edema. 3. Degenerative tearing of the posterior horn medial meniscus likely extending into the meniscal root. 4. Prominent expansion of the posterior horn lateral meniscus extending towards the meniscal root suggesting prominent degeneration. 5. Moderate to severe chondral thinning in the patellofemoral compartment with mild chondral thinning in the medial and lateral compartments. 6. Possible focal central tendinopathy in the distal patellar tendon. Electronically Signed   By: Gaylyn Rong M.D.   On: 11/07/2022 18:54   ECHOCARDIOGRAM COMPLETE  Result Date: 11/07/2022    ECHOCARDIOGRAM REPORT   Patient Name:   Michael Bender Date of Exam: 11/07/2022 Medical Rec #:  [id number]        Height:       71.0 in Accession #:    [id number]       Weight:       134.9 lb Date of Birth:  [birth year]       BSA:          1.784 m Patient Age:    76 years         BP:           139/76 mmHg Patient Gender: M                HR:           115 bpm. Exam Location:  Inpatient Procedure: 2D Echo, Cardiac Doppler and Color Doppler Indications:    Syncope R55  History:        Patient has no prior history of Echocardiogram examinations.                 COPD; Risk Factors:Hypertension and Dyslipidemia.  Sonographer:    Harriette Bouillon RDCS Referring Phys: [id number] CAROLE N HALL IMPRESSIONS  1. Left ventricular ejection fraction, by estimation, is 60 to 65%. The left  ventricle has normal function. The left ventricle has no regional wall motion abnormalities. Left ventricular diastolic parameters are consistent with Grade II diastolic dysfunction (pseudonormalization).  2. Right ventricular systolic function is normal. The right ventricular size is normal. There is normal pulmonary artery systolic pressure.  3. The mitral valve is normal in structure. No evidence of mitral valve regurgitation. No evidence of mitral stenosis.  4. The aortic valve is tricuspid. Aortic valve regurgitation is not visualized. No aortic stenosis is present.  5. The inferior vena cava is normal in size with greater than 50% respiratory variability, suggesting right atrial pressure of 3 mmHg. FINDINGS  Left Ventricle: Left ventricular ejection fraction, by estimation, is 60 to 65%. The left ventricle has normal function. The left ventricle has no regional wall motion abnormalities. The left ventricular internal cavity size was normal in size. There is  no left ventricular hypertrophy. Left ventricular diastolic parameters  Joint: Moderate to large knee effusion. Only thin synovial enhancement. This is unlikely to be a septic joint given the thin nature of the synovial enhancement. Thin medial plica noted. Popliteal Fossa:  Unremarkable Extensor Mechanism: Possible focal central tendinopathy in the distal patellar tendon  on image 13 series 7. Bones: 1.0 by 1.4 cm non-fragmented osteochondral lesion versus subcortical stress fracture anteriorly in the medial femoral condyle with surrounding marrow edema as on image 13 series 11 Other: No supplemental non-categorized findings. IMPRESSION: 1. Moderate to large knee effusion, but with only thin synovial enhancement. This is unlikely to be a septic joint given the thin nature of the synovial enhancement. 2. Non-fragmented osteochondral lesion versus subcortical stress fracture anteriorly in the medial femoral condyle with surrounding marrow edema. 3. Degenerative tearing of the posterior horn medial meniscus likely extending into the meniscal root. 4. Prominent expansion of the posterior horn lateral meniscus extending towards the meniscal root suggesting prominent degeneration. 5. Moderate to severe chondral thinning in the patellofemoral compartment with mild chondral thinning in the medial and lateral compartments. 6. Possible focal central tendinopathy in the distal patellar tendon. Electronically Signed   By: Gaylyn Rong M.D.   On: 11/07/2022 18:54   ECHOCARDIOGRAM COMPLETE  Result Date: 11/07/2022    ECHOCARDIOGRAM REPORT   Patient Name:   Michael Bender Date of Exam: 11/07/2022 Medical Rec #:  [id number]        Height:       71.0 in Accession #:    [id number]       Weight:       134.9 lb Date of Birth:  [birth year]       BSA:          1.784 m Patient Age:    76 years         BP:           139/76 mmHg Patient Gender: M                HR:           115 bpm. Exam Location:  Inpatient Procedure: 2D Echo, Cardiac Doppler and Color Doppler Indications:    Syncope R55  History:        Patient has no prior history of Echocardiogram examinations.                 COPD; Risk Factors:Hypertension and Dyslipidemia.  Sonographer:    Harriette Bouillon RDCS Referring Phys: [id number] CAROLE N HALL IMPRESSIONS  1. Left ventricular ejection fraction, by estimation, is 60 to 65%. The left  ventricle has normal function. The left ventricle has no regional wall motion abnormalities. Left ventricular diastolic parameters are consistent with Grade II diastolic dysfunction (pseudonormalization).  2. Right ventricular systolic function is normal. The right ventricular size is normal. There is normal pulmonary artery systolic pressure.  3. The mitral valve is normal in structure. No evidence of mitral valve regurgitation. No evidence of mitral stenosis.  4. The aortic valve is tricuspid. Aortic valve regurgitation is not visualized. No aortic stenosis is present.  5. The inferior vena cava is normal in size with greater than 50% respiratory variability, suggesting right atrial pressure of 3 mmHg. FINDINGS  Left Ventricle: Left ventricular ejection fraction, by estimation, is 60 to 65%. The left ventricle has normal function. The left ventricle has no regional wall motion abnormalities. The left ventricular internal cavity size was normal in size. There is  no left ventricular hypertrophy. Left ventricular diastolic parameters  Joint: Moderate to large knee effusion. Only thin synovial enhancement. This is unlikely to be a septic joint given the thin nature of the synovial enhancement. Thin medial plica noted. Popliteal Fossa:  Unremarkable Extensor Mechanism: Possible focal central tendinopathy in the distal patellar tendon  on image 13 series 7. Bones: 1.0 by 1.4 cm non-fragmented osteochondral lesion versus subcortical stress fracture anteriorly in the medial femoral condyle with surrounding marrow edema as on image 13 series 11 Other: No supplemental non-categorized findings. IMPRESSION: 1. Moderate to large knee effusion, but with only thin synovial enhancement. This is unlikely to be a septic joint given the thin nature of the synovial enhancement. 2. Non-fragmented osteochondral lesion versus subcortical stress fracture anteriorly in the medial femoral condyle with surrounding marrow edema. 3. Degenerative tearing of the posterior horn medial meniscus likely extending into the meniscal root. 4. Prominent expansion of the posterior horn lateral meniscus extending towards the meniscal root suggesting prominent degeneration. 5. Moderate to severe chondral thinning in the patellofemoral compartment with mild chondral thinning in the medial and lateral compartments. 6. Possible focal central tendinopathy in the distal patellar tendon. Electronically Signed   By: Gaylyn Rong M.D.   On: 11/07/2022 18:54   ECHOCARDIOGRAM COMPLETE  Result Date: 11/07/2022    ECHOCARDIOGRAM REPORT   Patient Name:   Michael Bender Date of Exam: 11/07/2022 Medical Rec #:  [id number]        Height:       71.0 in Accession #:    [id number]       Weight:       134.9 lb Date of Birth:  [birth year]       BSA:          1.784 m Patient Age:    76 years         BP:           139/76 mmHg Patient Gender: M                HR:           115 bpm. Exam Location:  Inpatient Procedure: 2D Echo, Cardiac Doppler and Color Doppler Indications:    Syncope R55  History:        Patient has no prior history of Echocardiogram examinations.                 COPD; Risk Factors:Hypertension and Dyslipidemia.  Sonographer:    Harriette Bouillon RDCS Referring Phys: [id number] CAROLE N HALL IMPRESSIONS  1. Left ventricular ejection fraction, by estimation, is 60 to 65%. The left  ventricle has normal function. The left ventricle has no regional wall motion abnormalities. Left ventricular diastolic parameters are consistent with Grade II diastolic dysfunction (pseudonormalization).  2. Right ventricular systolic function is normal. The right ventricular size is normal. There is normal pulmonary artery systolic pressure.  3. The mitral valve is normal in structure. No evidence of mitral valve regurgitation. No evidence of mitral stenosis.  4. The aortic valve is tricuspid. Aortic valve regurgitation is not visualized. No aortic stenosis is present.  5. The inferior vena cava is normal in size with greater than 50% respiratory variability, suggesting right atrial pressure of 3 mmHg. FINDINGS  Left Ventricle: Left ventricular ejection fraction, by estimation, is 60 to 65%. The left ventricle has normal function. The left ventricle has no regional wall motion abnormalities. The left ventricular internal cavity size was normal in size. There is  no left ventricular hypertrophy. Left ventricular diastolic parameters  Joint: Moderate to large knee effusion. Only thin synovial enhancement. This is unlikely to be a septic joint given the thin nature of the synovial enhancement. Thin medial plica noted. Popliteal Fossa:  Unremarkable Extensor Mechanism: Possible focal central tendinopathy in the distal patellar tendon  on image 13 series 7. Bones: 1.0 by 1.4 cm non-fragmented osteochondral lesion versus subcortical stress fracture anteriorly in the medial femoral condyle with surrounding marrow edema as on image 13 series 11 Other: No supplemental non-categorized findings. IMPRESSION: 1. Moderate to large knee effusion, but with only thin synovial enhancement. This is unlikely to be a septic joint given the thin nature of the synovial enhancement. 2. Non-fragmented osteochondral lesion versus subcortical stress fracture anteriorly in the medial femoral condyle with surrounding marrow edema. 3. Degenerative tearing of the posterior horn medial meniscus likely extending into the meniscal root. 4. Prominent expansion of the posterior horn lateral meniscus extending towards the meniscal root suggesting prominent degeneration. 5. Moderate to severe chondral thinning in the patellofemoral compartment with mild chondral thinning in the medial and lateral compartments. 6. Possible focal central tendinopathy in the distal patellar tendon. Electronically Signed   By: Gaylyn Rong M.D.   On: 11/07/2022 18:54   ECHOCARDIOGRAM COMPLETE  Result Date: 11/07/2022    ECHOCARDIOGRAM REPORT   Patient Name:   Michael Bender Date of Exam: 11/07/2022 Medical Rec #:  [id number]        Height:       71.0 in Accession #:    [id number]       Weight:       134.9 lb Date of Birth:  [birth year]       BSA:          1.784 m Patient Age:    76 years         BP:           139/76 mmHg Patient Gender: M                HR:           115 bpm. Exam Location:  Inpatient Procedure: 2D Echo, Cardiac Doppler and Color Doppler Indications:    Syncope R55  History:        Patient has no prior history of Echocardiogram examinations.                 COPD; Risk Factors:Hypertension and Dyslipidemia.  Sonographer:    Harriette Bouillon RDCS Referring Phys: [id number] CAROLE N HALL IMPRESSIONS  1. Left ventricular ejection fraction, by estimation, is 60 to 65%. The left  ventricle has normal function. The left ventricle has no regional wall motion abnormalities. Left ventricular diastolic parameters are consistent with Grade II diastolic dysfunction (pseudonormalization).  2. Right ventricular systolic function is normal. The right ventricular size is normal. There is normal pulmonary artery systolic pressure.  3. The mitral valve is normal in structure. No evidence of mitral valve regurgitation. No evidence of mitral stenosis.  4. The aortic valve is tricuspid. Aortic valve regurgitation is not visualized. No aortic stenosis is present.  5. The inferior vena cava is normal in size with greater than 50% respiratory variability, suggesting right atrial pressure of 3 mmHg. FINDINGS  Left Ventricle: Left ventricular ejection fraction, by estimation, is 60 to 65%. The left ventricle has normal function. The left ventricle has no regional wall motion abnormalities. The left ventricular internal cavity size was normal in size. There is  no left ventricular hypertrophy. Left ventricular diastolic parameters  Joint: Moderate to large knee effusion. Only thin synovial enhancement. This is unlikely to be a septic joint given the thin nature of the synovial enhancement. Thin medial plica noted. Popliteal Fossa:  Unremarkable Extensor Mechanism: Possible focal central tendinopathy in the distal patellar tendon  on image 13 series 7. Bones: 1.0 by 1.4 cm non-fragmented osteochondral lesion versus subcortical stress fracture anteriorly in the medial femoral condyle with surrounding marrow edema as on image 13 series 11 Other: No supplemental non-categorized findings. IMPRESSION: 1. Moderate to large knee effusion, but with only thin synovial enhancement. This is unlikely to be a septic joint given the thin nature of the synovial enhancement. 2. Non-fragmented osteochondral lesion versus subcortical stress fracture anteriorly in the medial femoral condyle with surrounding marrow edema. 3. Degenerative tearing of the posterior horn medial meniscus likely extending into the meniscal root. 4. Prominent expansion of the posterior horn lateral meniscus extending towards the meniscal root suggesting prominent degeneration. 5. Moderate to severe chondral thinning in the patellofemoral compartment with mild chondral thinning in the medial and lateral compartments. 6. Possible focal central tendinopathy in the distal patellar tendon. Electronically Signed   By: Gaylyn Rong M.D.   On: 11/07/2022 18:54   ECHOCARDIOGRAM COMPLETE  Result Date: 11/07/2022    ECHOCARDIOGRAM REPORT   Patient Name:   Michael Bender Date of Exam: 11/07/2022 Medical Rec #:  [id number]        Height:       71.0 in Accession #:    [id number]       Weight:       134.9 lb Date of Birth:  [birth year]       BSA:          1.784 m Patient Age:    76 years         BP:           139/76 mmHg Patient Gender: M                HR:           115 bpm. Exam Location:  Inpatient Procedure: 2D Echo, Cardiac Doppler and Color Doppler Indications:    Syncope R55  History:        Patient has no prior history of Echocardiogram examinations.                 COPD; Risk Factors:Hypertension and Dyslipidemia.  Sonographer:    Harriette Bouillon RDCS Referring Phys: [id number] CAROLE N HALL IMPRESSIONS  1. Left ventricular ejection fraction, by estimation, is 60 to 65%. The left  ventricle has normal function. The left ventricle has no regional wall motion abnormalities. Left ventricular diastolic parameters are consistent with Grade II diastolic dysfunction (pseudonormalization).  2. Right ventricular systolic function is normal. The right ventricular size is normal. There is normal pulmonary artery systolic pressure.  3. The mitral valve is normal in structure. No evidence of mitral valve regurgitation. No evidence of mitral stenosis.  4. The aortic valve is tricuspid. Aortic valve regurgitation is not visualized. No aortic stenosis is present.  5. The inferior vena cava is normal in size with greater than 50% respiratory variability, suggesting right atrial pressure of 3 mmHg. FINDINGS  Left Ventricle: Left ventricular ejection fraction, by estimation, is 60 to 65%. The left ventricle has normal function. The left ventricle has no regional wall motion abnormalities. The left ventricular internal cavity size was normal in size. There is  no left ventricular hypertrophy. Left ventricular diastolic parameters  Joint: Moderate to large knee effusion. Only thin synovial enhancement. This is unlikely to be a septic joint given the thin nature of the synovial enhancement. Thin medial plica noted. Popliteal Fossa:  Unremarkable Extensor Mechanism: Possible focal central tendinopathy in the distal patellar tendon  on image 13 series 7. Bones: 1.0 by 1.4 cm non-fragmented osteochondral lesion versus subcortical stress fracture anteriorly in the medial femoral condyle with surrounding marrow edema as on image 13 series 11 Other: No supplemental non-categorized findings. IMPRESSION: 1. Moderate to large knee effusion, but with only thin synovial enhancement. This is unlikely to be a septic joint given the thin nature of the synovial enhancement. 2. Non-fragmented osteochondral lesion versus subcortical stress fracture anteriorly in the medial femoral condyle with surrounding marrow edema. 3. Degenerative tearing of the posterior horn medial meniscus likely extending into the meniscal root. 4. Prominent expansion of the posterior horn lateral meniscus extending towards the meniscal root suggesting prominent degeneration. 5. Moderate to severe chondral thinning in the patellofemoral compartment with mild chondral thinning in the medial and lateral compartments. 6. Possible focal central tendinopathy in the distal patellar tendon. Electronically Signed   By: Gaylyn Rong M.D.   On: 11/07/2022 18:54   ECHOCARDIOGRAM COMPLETE  Result Date: 11/07/2022    ECHOCARDIOGRAM REPORT   Patient Name:   Michael Bender Date of Exam: 11/07/2022 Medical Rec #:  [id number]        Height:       71.0 in Accession #:    [id number]       Weight:       134.9 lb Date of Birth:  [birth year]       BSA:          1.784 m Patient Age:    76 years         BP:           139/76 mmHg Patient Gender: M                HR:           115 bpm. Exam Location:  Inpatient Procedure: 2D Echo, Cardiac Doppler and Color Doppler Indications:    Syncope R55  History:        Patient has no prior history of Echocardiogram examinations.                 COPD; Risk Factors:Hypertension and Dyslipidemia.  Sonographer:    Harriette Bouillon RDCS Referring Phys: [id number] CAROLE N HALL IMPRESSIONS  1. Left ventricular ejection fraction, by estimation, is 60 to 65%. The left  ventricle has normal function. The left ventricle has no regional wall motion abnormalities. Left ventricular diastolic parameters are consistent with Grade II diastolic dysfunction (pseudonormalization).  2. Right ventricular systolic function is normal. The right ventricular size is normal. There is normal pulmonary artery systolic pressure.  3. The mitral valve is normal in structure. No evidence of mitral valve regurgitation. No evidence of mitral stenosis.  4. The aortic valve is tricuspid. Aortic valve regurgitation is not visualized. No aortic stenosis is present.  5. The inferior vena cava is normal in size with greater than 50% respiratory variability, suggesting right atrial pressure of 3 mmHg. FINDINGS  Left Ventricle: Left ventricular ejection fraction, by estimation, is 60 to 65%. The left ventricle has normal function. The left ventricle has no regional wall motion abnormalities. The left ventricular internal cavity size was normal in size. There is  no left ventricular hypertrophy. Left ventricular diastolic parameters  Joint: Moderate to large knee effusion. Only thin synovial enhancement. This is unlikely to be a septic joint given the thin nature of the synovial enhancement. Thin medial plica noted. Popliteal Fossa:  Unremarkable Extensor Mechanism: Possible focal central tendinopathy in the distal patellar tendon  on image 13 series 7. Bones: 1.0 by 1.4 cm non-fragmented osteochondral lesion versus subcortical stress fracture anteriorly in the medial femoral condyle with surrounding marrow edema as on image 13 series 11 Other: No supplemental non-categorized findings. IMPRESSION: 1. Moderate to large knee effusion, but with only thin synovial enhancement. This is unlikely to be a septic joint given the thin nature of the synovial enhancement. 2. Non-fragmented osteochondral lesion versus subcortical stress fracture anteriorly in the medial femoral condyle with surrounding marrow edema. 3. Degenerative tearing of the posterior horn medial meniscus likely extending into the meniscal root. 4. Prominent expansion of the posterior horn lateral meniscus extending towards the meniscal root suggesting prominent degeneration. 5. Moderate to severe chondral thinning in the patellofemoral compartment with mild chondral thinning in the medial and lateral compartments. 6. Possible focal central tendinopathy in the distal patellar tendon. Electronically Signed   By: Gaylyn Rong M.D.   On: 11/07/2022 18:54   ECHOCARDIOGRAM COMPLETE  Result Date: 11/07/2022    ECHOCARDIOGRAM REPORT   Patient Name:   Michael Bender Date of Exam: 11/07/2022 Medical Rec #:  [id number]        Height:       71.0 in Accession #:    [id number]       Weight:       134.9 lb Date of Birth:  [birth year]       BSA:          1.784 m Patient Age:    76 years         BP:           139/76 mmHg Patient Gender: M                HR:           115 bpm. Exam Location:  Inpatient Procedure: 2D Echo, Cardiac Doppler and Color Doppler Indications:    Syncope R55  History:        Patient has no prior history of Echocardiogram examinations.                 COPD; Risk Factors:Hypertension and Dyslipidemia.  Sonographer:    Harriette Bouillon RDCS Referring Phys: [id number] CAROLE N HALL IMPRESSIONS  1. Left ventricular ejection fraction, by estimation, is 60 to 65%. The left  ventricle has normal function. The left ventricle has no regional wall motion abnormalities. Left ventricular diastolic parameters are consistent with Grade II diastolic dysfunction (pseudonormalization).  2. Right ventricular systolic function is normal. The right ventricular size is normal. There is normal pulmonary artery systolic pressure.  3. The mitral valve is normal in structure. No evidence of mitral valve regurgitation. No evidence of mitral stenosis.  4. The aortic valve is tricuspid. Aortic valve regurgitation is not visualized. No aortic stenosis is present.  5. The inferior vena cava is normal in size with greater than 50% respiratory variability, suggesting right atrial pressure of 3 mmHg. FINDINGS  Left Ventricle: Left ventricular ejection fraction, by estimation, is 60 to 65%. The left ventricle has normal function. The left ventricle has no regional wall motion abnormalities. The left ventricular internal cavity size was normal in size. There is  no left ventricular hypertrophy. Left ventricular diastolic parameters

## 2022-11-14 NOTE — Consult Note (Signed)
Value-Based [facility]  [facility] Inpatient Consult   11/14/2022  AVIEN TAHA [birth year] [id number]  [facility] [[facility]]  Accountable Care Organization [ACO] Patient: Michael Bender   Primary Care Provider:  Lula Olszewski, MD with [place] [facility] which is listed to provide the transition of care follow up   Patient was reviewed for less than 7 day readmission with high risk score for unplanned readmission risk, 8 day length of stay barriers to care for returning to community assessed.  Patient was screened for hospitalization and on behalf of [facility] /[facility] Care Coordination to assess for post hospital community care needs.  Patient is being considered for a skilled nursing facility level of care for post hospital transition and transitioned today to [facility].    Plan:   Will notify the Community [facility] RN can follow for any known or needs for transitional care needs for returning to post facility care coordination needs to return to community. Patient had been referred to [facility] RN for Care Coordination prior to admission  For questions or referrals, please contact:   Charlesetta Shanks, RN BSN CCM [facility]  [phone] business mobile phone Toll free office [phone]  Fax number: [phone] [email or web address]

## 2022-11-14 NOTE — Progress Notes (Signed)
Report called to [facility]

## 2022-11-14 NOTE — TOC Transition Note (Signed)
Transition of Care [facility]) - CM/SW Discharge Note   Patient Details  Name: Michael Bender MRN: [id number] Date of Birth: 1945/07/05  Transition of Care [facility]) CM/SW Contact:  Delilah Shan, LCSWA Phone Number: 11/14/2022, 11:09 AM   Clinical Narrative:     Patient will DC to: [facility]  Anticipated DC date: 11/14/2022  Family notified: [name] and Ace Gins  Transport by: Sharin Mons   ?  Per MD patient ready for DC to [facility] . RN, patient, patient's family, and facility notified of DC. Discharge Summary sent to facility. RN given number for report tele# [phone] RM# [id number]. DC packet on chart. DNR signed by MD attached to patients DC packet.Ambulance transport requested for patient.  CSW signing off.   Final next level of care: Skilled Nursing Facility Barriers to Discharge: No Barriers Identified   Patient Goals and CMS Choice CMS Medicare.gov Compare Post Acute Care list provided to:: Patient (patient and patients son) Choice offered to / list presented to : Patient, Adult Children (patient and patients son)  Discharge Placement                Patient chooses bed at: [facility] [place] Patient to be transferred to facility by: [facility] Name of family member notified: [name] and Ace Gins Patient and family notified of of transfer: 11/14/22  Discharge Plan and Services Additional resources added to the After Visit Summary for   In-house Referral: Clinical Social Work                                   Social Determinants of Health (SDOH) Interventions SDOH Screenings   Food Insecurity: No Food Insecurity (11/06/2022)  Housing: Low Risk  (11/06/2022)  Transportation Needs: No Transportation Needs (11/06/2022)  Utilities: Not At Risk (11/06/2022)  Alcohol Screen: Low Risk  (07/14/2021)  Depression (PHQ2-9): Low Risk  (10/13/2022)  Financial Resource Strain: Low Risk  (10/13/2022)  Physical Activity: Insufficiently Active (10/13/2022)  Social Connections: Socially  Isolated (10/13/2022)  Stress: No Stress Concern Present (10/13/2022)  Tobacco Use: High Risk (11/09/2022)  Health Literacy: Inadequate Health Literacy (10/13/2022)     Readmission Risk Interventions     No data to display

## 2022-11-14 NOTE — Plan of Care (Signed)

## 2022-11-14 NOTE — Progress Notes (Signed)
Subjective:   No new complaints    Antibiotics:  Anti-infectives (From admission, onward)    Start     Dose/Rate Route Frequency Ordered Stop   11/14/22 0000  ceFAZolin (ANCEF) IVPB        2 g Intravenous Every 8 hours 11/14/22 1006 12/21/22 2359   11/07/22 0100  ceFAZolin (ANCEF) IVPB 2g/100 mL premix  Status:  Discontinued        2 g 200 mL/hr over 30 Minutes Intravenous Every 8 hours 11/06/22 1540 11/14/22 1713   11/06/22 0100  cefTRIAXone (ROCEPHIN) 2 g in sodium chloride 0.9 % 100 mL IVPB  Status:  Discontinued        2 g 200 mL/hr over 30 Minutes Intravenous Daily at bedtime 11/06/22 0041 11/06/22 1513       Medications: Scheduled Meds:   Continuous Infusions:   PRN Meds:.    Objective: Weight change:   Intake/Output Summary (Last 24 hours) at 11/14/2022 1842 Last data filed at 11/14/2022 0836 Gross per 24 hour  Intake 174.67 ml  Output 650 ml  Net -475.33 ml   Blood pressure 130/76, pulse 88, temperature 98 F (36.7 C), temperature source Oral, resp. rate 18, height 5\' 11"  (1.803 m), weight 61.2 kg, SpO2 97%. Temp:  [98 F (36.7 C)-98.9 F (37.2 C)] 98 F (36.7 C) (10/14 0825) Pulse Rate:  [88] 88 (10/13 2012) Resp:  [18] 18 (10/14 0459) BP: (130-158)/(62-78) 130/76 (10/14 0825) SpO2:  [97 %-99 %] 97 % (10/14 0825)  Physical Exam: Physical Exam Constitutional:      Appearance: He is well-developed.  HENT:     Head: Normocephalic and atraumatic.  Eyes:     Conjunctiva/sclera: Conjunctivae normal.  Cardiovascular:     Rate and Rhythm: Normal rate and regular rhythm.  Pulmonary:     Effort: Pulmonary effort is normal. No respiratory distress.     Breath sounds: Normal breath sounds. No stridor. No wheezing.  Abdominal:     General: There is no distension.     Palpations: Abdomen is soft.  Musculoskeletal:        General: Normal range of motion.     Cervical back: Normal range of motion and neck supple.  Skin:    General:  Skin is warm and dry.     Findings: No erythema or rash.  Neurological:     General: No focal deficit present.     Mental Status: He is alert and oriented to person, place, and time.  Psychiatric:        Mood and Affect: Mood normal.        Behavior: Behavior normal.        Thought Content: Thought content normal.        Judgment: Judgment normal.     Left wrist dressed   Finger unchanged  CBC:    BMET Recent Labs    11/13/22 0500 11/14/22 0628  NA 137 137  K 4.0 3.8  CL 101 102  CO2 25 25  GLUCOSE 150* 112*  BUN 22 21  CREATININE 1.10 1.28*  CALCIUM 8.0* 7.8*     Liver Panel  No results for input(s): "PROT", "ALBUMIN", "AST", "ALT", "ALKPHOS", "BILITOT", "BILIDIR", "IBILI" in the last 72 hours.      Sedimentation Rate No results for input(s): "ESRSEDRATE" in the last 72 hours. C-Reactive Protein No results for input(s): "CRP" in the last 72 hours.  Micro Results: Recent Results (from the past 720 hour(s))  Culture, blood (Routine X 2) w Reflex to ID Panel     Status: Abnormal   Collection Time: 11/06/22  1:13 AM   Specimen: BLOOD RIGHT ARM  Result Value Ref Range Status   Specimen Description BLOOD RIGHT ARM  Final   Special Requests   Final    BOTTLES DRAWN AEROBIC AND ANAEROBIC Blood Culture results may not be optimal due to an excessive volume of blood received in culture bottles   Culture  Setup Time   Final    GRAM POSITIVE COCCI IN BOTH AEROBIC AND ANAEROBIC BOTTLES Organism ID to follow CRITICAL RESULT CALLED TO, READ BACK BY AND VERIFIED WITH: PHARMD JESSICA MILLEN [id number] BY Berline Chough, [place] Performed at [facility], [address], [place], [place] [address]    Culture STAPHYLOCOCCUS AUREUS (A)  Final   Report Status 11/08/2022 FINAL  Final   Organism ID, Bacteria STAPHYLOCOCCUS AUREUS  Final      Susceptibility   Staphylococcus aureus - MIC*    CIPROFLOXACIN <=0.5 SENSITIVE Sensitive     ERYTHROMYCIN <=0.25 SENSITIVE  Sensitive     GENTAMICIN <=0.5 SENSITIVE Sensitive     OXACILLIN 0.5 SENSITIVE Sensitive     TETRACYCLINE <=1 SENSITIVE Sensitive     VANCOMYCIN <=0.5 SENSITIVE Sensitive     TRIMETH/SULFA <=10 SENSITIVE Sensitive     CLINDAMYCIN <=0.25 SENSITIVE Sensitive     RIFAMPIN <=0.5 SENSITIVE Sensitive     Inducible Clindamycin NEGATIVE Sensitive     LINEZOLID 1 SENSITIVE Sensitive     * STAPHYLOCOCCUS AUREUS  Blood Culture ID Panel (Reflexed)     Status: Abnormal   Collection Time: 11/06/22  1:13 AM  Result Value Ref Range Status   Enterococcus faecalis NOT DETECTED NOT DETECTED Final   Enterococcus Faecium NOT DETECTED NOT DETECTED Final   Listeria monocytogenes NOT DETECTED NOT DETECTED Final   Staphylococcus species DETECTED (A) NOT DETECTED Final    Comment: CRITICAL RESULT CALLED TO, READ BACK BY AND VERIFIED WITH: PHARMD JESSICA MILLEN [phone] 1429 BY J RAZZAK, MT    Staphylococcus aureus (BCID) DETECTED (A) NOT DETECTED Final    Comment: CRITICAL RESULT CALLED TO, READ BACK BY AND VERIFIED WITH: PHARMD JESSICA MILLEN [id number] 1429 BY J RAZZAK, MT    Staphylococcus epidermidis NOT DETECTED NOT DETECTED Final   Staphylococcus lugdunensis NOT DETECTED NOT DETECTED Final   Streptococcus species NOT DETECTED NOT DETECTED Final   Streptococcus agalactiae NOT DETECTED NOT DETECTED Final   Streptococcus pneumoniae NOT DETECTED NOT DETECTED Final   Streptococcus pyogenes NOT DETECTED NOT DETECTED Final   A.calcoaceticus-baumannii NOT DETECTED NOT DETECTED Final   Bacteroides fragilis NOT DETECTED NOT DETECTED Final   Enterobacterales NOT DETECTED NOT DETECTED Final   Enterobacter cloacae complex NOT DETECTED NOT DETECTED Final   Escherichia coli NOT DETECTED NOT DETECTED Final   Klebsiella aerogenes NOT DETECTED NOT DETECTED Final   Klebsiella oxytoca NOT DETECTED NOT DETECTED Final   Klebsiella pneumoniae NOT DETECTED NOT DETECTED Final   Proteus species NOT DETECTED NOT DETECTED  Final   Salmonella species NOT DETECTED NOT DETECTED Final   Serratia marcescens NOT DETECTED NOT DETECTED Final   Haemophilus influenzae NOT DETECTED NOT DETECTED Final   Neisseria meningitidis NOT DETECTED NOT DETECTED Final   Pseudomonas aeruginosa NOT DETECTED NOT DETECTED Final   Stenotrophomonas maltophilia NOT DETECTED NOT DETECTED Final   Candida albicans NOT DETECTED NOT DETECTED Final   Candida auris NOT DETECTED NOT DETECTED Final   Candida glabrata NOT  DETECTED NOT DETECTED Final   Candida krusei NOT DETECTED NOT DETECTED Final   Candida parapsilosis NOT DETECTED NOT DETECTED Final   Candida tropicalis NOT DETECTED NOT DETECTED Final   Cryptococcus neoformans/gattii NOT DETECTED NOT DETECTED Final   Meth resistant mecA/C and MREJ NOT DETECTED NOT DETECTED Final    Comment: Performed at [facility], [address], [place], [place] [address]  Culture, blood (Routine X 2) w Reflex to ID Panel     Status: Abnormal   Collection Time: 11/06/22  1:14 AM   Specimen: BLOOD RIGHT ARM  Result Value Ref Range Status   Specimen Description BLOOD RIGHT ARM  Final   Special Requests   Final    BOTTLES DRAWN AEROBIC AND ANAEROBIC Blood Culture results may not be optimal due to an excessive volume of blood received in culture bottles   Culture  Setup Time   Final    GRAM POSITIVE COCCI IN BOTH AEROBIC AND ANAEROBIC BOTTLES    Culture (A)  Final    STAPHYLOCOCCUS AUREUS SUSCEPTIBILITIES PERFORMED ON PREVIOUS CULTURE WITHIN THE LAST 5 DAYS. Performed at [facility], [address], [place], [place] [address]    Report Status 11/08/2022 FINAL  Final  MRSA Next Gen by PCR, Nasal     Status: None   Collection Time: 11/06/22  1:29 AM   Specimen: Nasal Mucosa; Nasal Swab  Result Value Ref Range Status   MRSA by PCR Next Gen NOT DETECTED NOT DETECTED Final    Comment: (NOTE) The GeneXpert MRSA Assay (FDA approved for NASAL specimens only), is one component of a comprehensive  MRSA colonization surveillance program. It is not intended to diagnose MRSA infection nor to guide or monitor treatment for MRSA infections. Test performance is not FDA approved in patients less than 89 years old. Performed at [facility], [address] [place], [place] [address]   SARS Coronavirus 2 by RT PCR (hospital order, performed in [facility] hospital lab) *cepheid single result test* Anterior Nasal Swab     Status: None   Collection Time: 11/06/22  7:33 AM   Specimen: Anterior Nasal Swab  Result Value Ref Range Status   SARS Coronavirus 2 by RT PCR NEGATIVE NEGATIVE Final    Comment: Performed at [facility], [address], [place], [place] [address]  Respiratory (~20 pathogens) panel by PCR     Status: None   Collection Time: 11/06/22  7:34 AM   Specimen: Nasopharyngeal Swab; Respiratory  Result Value Ref Range Status   Adenovirus NOT DETECTED NOT DETECTED Final   Coronavirus 229E NOT DETECTED NOT DETECTED Final    Comment: (NOTE) The Coronavirus on the Respiratory Panel, DOES NOT test for the novel  Coronavirus (2019 nCoV)    Coronavirus HKU1 NOT DETECTED NOT DETECTED Final   Coronavirus NL63 NOT DETECTED NOT DETECTED Final   Coronavirus OC43 NOT DETECTED NOT DETECTED Final   Metapneumovirus NOT DETECTED NOT DETECTED Final   Rhinovirus / Enterovirus NOT DETECTED NOT DETECTED Final   Influenza A NOT DETECTED NOT DETECTED Final   Influenza B NOT DETECTED NOT DETECTED Final   Parainfluenza Virus 1 NOT DETECTED NOT DETECTED Final   Parainfluenza Virus 2 NOT DETECTED NOT DETECTED Final   Parainfluenza Virus 3 NOT DETECTED NOT DETECTED Final   Parainfluenza Virus 4 NOT DETECTED NOT DETECTED Final   Respiratory Syncytial Virus NOT DETECTED NOT DETECTED Final   Bordetella pertussis NOT DETECTED NOT DETECTED Final  Bordetella Parapertussis NOT DETECTED NOT DETECTED Final   Chlamydophila pneumoniae NOT DETECTED NOT DETECTED Final   Mycoplasma pneumoniae NOT  DETECTED NOT DETECTED Final    Comment: Performed at [facility], [address] [place], [place] [address]  Culture, blood (Routine X 2) w Reflex to ID Panel     Status: None   Collection Time: 11/07/22  6:08 AM   Specimen: BLOOD RIGHT ARM  Result Value Ref Range Status   Specimen Description BLOOD RIGHT ARM  Final   Special Requests   Final    BOTTLES DRAWN AEROBIC AND ANAEROBIC Blood Culture adequate volume   Culture   Final    NO GROWTH 5 DAYS Performed at [facility], [address] [place], [place] [address]    Report Status 11/12/2022 FINAL  Final  Culture, blood (Routine X 2) w Reflex to ID Panel     Status: None   Collection Time: 11/07/22  6:08 AM   Specimen: BLOOD RIGHT HAND  Result Value Ref Range Status   Specimen Description BLOOD RIGHT HAND  Final   Special Requests   Final    BOTTLES DRAWN AEROBIC AND ANAEROBIC Blood Culture results may not be optimal due to an excessive volume of blood received in culture bottles   Culture   Final    NO GROWTH 5 DAYS Performed at [facility], [address], [place], [place] [address]    Report Status 11/12/2022 FINAL  Final  Culture, blood (Routine X 2) w Reflex to ID Panel     Status: None   Collection Time: 11/07/22  9:19 PM   Specimen: BLOOD RIGHT HAND  Result Value Ref Range Status   Specimen Description BLOOD RIGHT HAND  Final   Special Requests   Final    BOTTLES DRAWN AEROBIC AND ANAEROBIC Blood Culture adequate volume   Culture   Final    NO GROWTH 5 DAYS Performed at [facility], [address] [place], [place] [address]    Report Status 11/12/2022 FINAL  Final  Culture, blood (Routine X 2) w Reflex to ID Panel     Status: None   Collection Time: 11/07/22  9:19 PM   Specimen: BLOOD RIGHT HAND  Result Value Ref Range Status   Specimen Description BLOOD RIGHT HAND  Final   Special Requests   Final    BOTTLES DRAWN AEROBIC ONLY Blood Culture results may not be optimal due to an  inadequate volume of blood received in culture bottles   Culture   Final    NO GROWTH 5 DAYS Performed at [facility], [address], [place], [place] [address]    Report Status 11/12/2022 FINAL  Final  Body fluid culture w Gram Stain     Status: None   Collection Time: 11/08/22 12:05 PM   Specimen: Body Fluid  Result Value Ref Range Status   Specimen Description FLUID SYNOVIAL RIGHT KNEE  Final   Special Requests NONE  Final   Gram Stain   Final    RARE WBC PRESENT, PREDOMINANTLY PMN NO ORGANISMS SEEN    Culture   Final    NO GROWTH 3 DAYS Performed at [facility], [address], [place], [place] [address]    Report Status 11/11/2022 FINAL  Final  Gram stain     Status: None   Collection Time: 11/08/22  6:50 PM   Specimen: Synovium; Body Fluid  Result Value Ref  Range Status   Specimen Description SYNOVIAL  Final   Special Requests WRIST  Final   Gram Stain   Final    ABUNDANT WBC PRESENT, PREDOMINANTLY PMN NO ORGANISMS SEEN Performed at [facility], [address], [place], [place] [address]    Report Status 11/08/2022 FINAL  Final  Body fluid culture w Gram Stain     Status: None   Collection Time: 11/08/22  7:17 PM   Specimen: Synovium  Result Value Ref Range Status   Specimen Description SYNOVIAL  Final   Special Requests WRIST  Final   Gram Stain   Final    ABUNDANT WBC PRESENT, PREDOMINANTLY PMN NO ORGANISMS SEEN Performed at [facility], [address] [place], [place] [address]    Culture RARE STAPHYLOCOCCUS AUREUS  Final   Report Status 11/12/2022 FINAL  Final   Organism ID, Bacteria STAPHYLOCOCCUS AUREUS  Final      Susceptibility   Staphylococcus aureus - MIC*    CIPROFLOXACIN <=0.5 SENSITIVE Sensitive     ERYTHROMYCIN <=0.25 SENSITIVE Sensitive     GENTAMICIN <=0.5 SENSITIVE Sensitive     OXACILLIN 0.5 SENSITIVE Sensitive     TETRACYCLINE <=1 SENSITIVE Sensitive     VANCOMYCIN <=0.5 SENSITIVE Sensitive     TRIMETH/SULFA  <=10 SENSITIVE Sensitive     CLINDAMYCIN <=0.25 SENSITIVE Sensitive     RIFAMPIN <=0.5 SENSITIVE Sensitive     Inducible Clindamycin NEGATIVE Sensitive     LINEZOLID 2 SENSITIVE Sensitive     * RARE STAPHYLOCOCCUS AUREUS  Surgical pcr screen     Status: Abnormal   Collection Time: 11/09/22  4:36 AM   Specimen: Nasal Mucosa; Nasal Swab  Result Value Ref Range Status   MRSA, PCR NEGATIVE NEGATIVE Final   Staphylococcus aureus POSITIVE (A) NEGATIVE Final    Comment: (NOTE) The Xpert SA Assay (FDA approved for NASAL specimens in patients 18 years of age and older), is one component of a comprehensive surveillance program. It is not intended to diagnose infection nor to guide or monitor treatment. Performed at [facility], [address], [place], [place] [address]   Fungus Culture With Stain     Status: None (Preliminary result)   Collection Time: 11/09/22  7:40 AM   Specimen: PATH Cytology Misc. fluid; Body Fluid  Result Value Ref Range Status   Fungus Stain Final report  Final    Comment: (NOTE) Performed At: [facility] [address] [place], [place] [id number] Jolene Schimke MD OZ:[id number]    Fungus (Mycology) Culture PENDING  Incomplete   Fungal Source SYNOVIAL  Final    Comment: Performed at [facility], [address] [place], [place] [address]  Aerobic/Anaerobic Culture w Gram Stain (surgical/deep wound)     Status: None   Collection Time: 11/09/22  7:40 AM   Specimen: PATH Cytology Misc. fluid; Body Fluid  Result Value Ref Range Status   Specimen Description SYNOVIAL  Final   Special Requests LEFT WRIST SYN FLD  Final   Gram Stain   Final    ABUNDANT WBC PRESENT, PREDOMINANTLY PMN NO ORGANISMS SEEN    Culture   Final    RARE STAPHYLOCOCCUS AUREUS NO ANAEROBES ISOLATED Performed at [facility], [address], [place], [place] [address]    Report Status 11/14/2022 FINAL  Final   Organism ID, Bacteria STAPHYLOCOCCUS AUREUS  Final       Susceptibility   Staphylococcus aureus - MIC*  CIPROFLOXACIN <=0.5 SENSITIVE Sensitive     ERYTHROMYCIN <=0.25 SENSITIVE Sensitive     GENTAMICIN <=0.5 SENSITIVE Sensitive     OXACILLIN 0.5 SENSITIVE Sensitive     TETRACYCLINE <=1 SENSITIVE Sensitive     VANCOMYCIN 1 SENSITIVE Sensitive     TRIMETH/SULFA <=10 SENSITIVE Sensitive     CLINDAMYCIN <=0.25 SENSITIVE Sensitive     RIFAMPIN <=0.5 SENSITIVE Sensitive     Inducible Clindamycin NEGATIVE Sensitive     LINEZOLID 1 SENSITIVE Sensitive     * RARE STAPHYLOCOCCUS AUREUS  Acid Fast Smear (AFB)     Status: None   Collection Time: 11/09/22  7:40 AM   Specimen: PATH Cytology Misc. fluid; Body Fluid  Result Value Ref Range Status   AFB Specimen Processing Concentration  Final   Acid Fast Smear Negative  Final    Comment: (NOTE) Performed At: [facility] [address] [place], [place] [id number] Jolene Schimke MD QI:[id number]    Source (AFB) SYNOVIAL  Final    Comment: Performed at [facility], [address] [place], [place] [address]  Fungus Culture Result     Status: None   Collection Time: 11/09/22  7:40 AM  Result Value Ref Range Status   Result 1 Comment  Final    Comment: (NOTE) KOH/Calcofluor preparation:  no fungus observed. Performed At: [facility] [address] [place], [place] [id number] Jolene Schimke MD ZY:[id number]     Studies/Results: [place] EKG SITE RITE  Result Date: 11/13/2022 If [facility] image not attached, placement could not be confirmed due to current cardiac rhythm.     Assessment/Plan:  INTERVAL HISTORY:   Patient seen by Dr Hulda Humphrey again on Friday and knee did not appear infected on exam  Principal Problem:   MSSA bacteremia Active Problems:   Syncope   Septic infrapatellar bursitis of right knee   Staphylococcal arthritis of left wrist (HCC)   Infected blister of left index finger   Staphylococcal arthritis of right knee (HCC)   Neck pain   Finger  pain, left   Septic arthritis of wrist, left (HCC)   Cervical discitis   Hardware complicating wound infection (HCC)    Michael Bender is a 77 y.o. male with history of cervical lumbar spine hardware and recent admission for falls weakness and rhabdomyolysis who was discharged to skilled nursing facility but then brought back with sepsis due to MSSA bacteremia.  He had fallen but also headaches was at pain in his left wrist as well as pain in his right knee and index finger.  He has been proven to have a left septic wrist and now status post I&D.   MRI of the hand shows multifocal osteoarthritic changes throughout the second hand most advanced at the second MCP joint with nonspecific marrow edema enhancement around the second metacarpal phalangeal joint with surrounding soft tissue swelling concerning for inflammatory arthropathy versus septic arthritis  MRI of the C-spine does show some mild reactive endplate changes and marrow edema from C5-C6 which could be degenerative or could be early osteomyelitis discitis.  Orthopedic hand surgery closely following him and they do not feel that he has a likely infection in his right hand but I think he WILL NEED help from orthopedic surgery as well with regards to his knee.  I discussed the case with Dr. Hulda Humphrey Friday and he re-examined the patient and was reassured taht knee is not infected   Diagnosis: MSSA bacteremia and septic left wrist  Culture Result:  MSSA  Allergies  Allergen Reactions   Nubain [Nalbuphine Hcl]     Muscle contraction    OPAT Orders Discharge antibiotics to be given via PICC line Discharge antibiotics: Regimen: Cefazolin 2 g IV 8h End date: 12/21/22  [facility] Care Per Protocol:  Home health RN for IV administration and teaching; PICC line care and labs.    Labs weekly while on IV antibiotics: _x_ CBC with differential _x_ BMP __ CMP _x_ CRP x__ ESR __ Vancomycin trough __ CK  _x_ Please pull PIC at  completion of IV antibiotics __ Please leave PIC in place until doctor has seen patient or been notified  Fax weekly labs to [phone]  Clinic Follow Up Appt:    KRAVEN CALK has an appointment on 12/06/2022 at 215 PM  with Dr. Daiva Eves at  [facility] for Infectious Disease, which  is located in the [facility] at  [address] in [place].  [address], which is located to the left of the elevators.  Phone: [phone]  Fax: [phone]  [email or web address]  The patient should arrive 30 minutes prior to their appoitment.   I have personally spent 50 minutes involved in face-to-face and non-face-to-face activities for this patient on the day of the visit. Professional time spent includes the following activities: Preparing to see the patient (review of tests), Obtaining and/or reviewing separately obtained history (admission/discharge record), Performing a medically appropriate examination and/or evaluation , Ordering medications/tests/procedures, referring and communicating with other health care professionals, Documenting clinical information in the EMR, Independently interpreting results (not separately reported), Communicating results to the patient/family/caregiver, Counseling and educating the patient/family/caregiver and Care coordination (not separately reported).     LOS: 9 days   Acey Lav 11/14/2022, 6:42 PM

## 2022-11-14 NOTE — Progress Notes (Signed)
PHARMACY CONSULT NOTE FOR:  OUTPATIENT  PARENTERAL ANTIBIOTIC THERAPY (OPAT)  Indication: MSSA Bacteremia, septic L wrist Regimen: Cefazolin 2 g IV 8h End date: 12/21/22  IV antibiotic discharge orders are pended. To discharging provider:  please sign these orders via discharge navigator,  Select New Orders & click on the button choice - Manage This Unsigned Work.     Thank you for allowing pharmacy to be a part of this patient's care.  Louie Casa Twanda Stakes 11/14/2022, 9:36 AM

## 2022-11-14 NOTE — Progress Notes (Signed)
Mobility Specialist Progress Note:    11/14/22 1100  Mobility  Activity Stood at bedside  Level of Assistance Minimal assist, patient does 75% or more  Assistive Device Front wheel walker  LUE Weight Bearing NWB  RLE Weight Bearing WBAT  Activity Response Tolerated well  Mobility Referral Yes  $Mobility charge 1 Mobility  Mobility Specialist Start Time (ACUTE ONLY) 1136  Mobility Specialist Stop Time (ACUTE ONLY) 1155  Mobility Specialist Time Calculation (min) (ACUTE ONLY) 19 min   Pt received in bed, agreeable to mobility. Performed x1 STS, asymptomatic throughout w/ no complaints. Assisted pt in clothing. Pt left in bed with call bell and RN present.  [place]ante Earlene Plater Mobility Specialist Please contact via Special educational needs teacher or Rehab office at [phone]

## 2022-11-15 ENCOUNTER — Telehealth: Payer: Self-pay | Admitting: Orthopedic Surgery

## 2022-11-15 NOTE — Telephone Encounter (Signed)
Kelly from [facility] needed to know if he could have a platform rolling walker? CB#[phone]

## 2022-11-16 DIAGNOSIS — N401 Enlarged prostate with lower urinary tract symptoms: Secondary | ICD-10-CM | POA: Diagnosis not present

## 2022-11-16 DIAGNOSIS — I10 Essential (primary) hypertension: Secondary | ICD-10-CM | POA: Diagnosis not present

## 2022-11-16 DIAGNOSIS — E119 Type 2 diabetes mellitus without complications: Secondary | ICD-10-CM | POA: Diagnosis not present

## 2022-11-16 DIAGNOSIS — M79651 Pain in right thigh: Secondary | ICD-10-CM | POA: Diagnosis not present

## 2022-11-16 DIAGNOSIS — I7389 Other specified peripheral vascular diseases: Secondary | ICD-10-CM | POA: Diagnosis not present

## 2022-11-16 DIAGNOSIS — M00839 Arthritis due to other bacteria, unspecified wrist: Secondary | ICD-10-CM | POA: Diagnosis not present

## 2022-11-16 DIAGNOSIS — F1120 Opioid dependence, uncomplicated: Secondary | ICD-10-CM | POA: Diagnosis not present

## 2022-11-16 DIAGNOSIS — M00869 Arthritis due to other bacteria, unspecified knee: Secondary | ICD-10-CM | POA: Diagnosis not present

## 2022-11-16 DIAGNOSIS — K861 Other chronic pancreatitis: Secondary | ICD-10-CM | POA: Diagnosis not present

## 2022-11-16 DIAGNOSIS — M0080 Arthritis due to other bacteria, unspecified joint: Secondary | ICD-10-CM | POA: Diagnosis not present

## 2022-11-16 DIAGNOSIS — J449 Chronic obstructive pulmonary disease, unspecified: Secondary | ICD-10-CM | POA: Diagnosis not present

## 2022-11-16 NOTE — Progress Notes (Signed)
Care Coordination  Outreach Note  11/16/2022 Name: HET HELFERICH MRN: [id number] DOB: 1945-11-04   Care Coordination Outreach Attempts: A second unsuccessful outreach was attempted today to offer the patient with information about available care coordination services.  Follow Up Plan:  Additional outreach attempts will be made to offer the patient care coordination information and services.   Encounter Outcome:  No Answer  Burman Nieves, CCMA Care Coordination Care Guide Direct Dial: [phone]

## 2022-11-16 NOTE — Telephone Encounter (Signed)
Talked with Tresa Endo and advised of message below.

## 2022-11-17 NOTE — Progress Notes (Signed)
Spoke with pt son [name] who says pt was d/c from hospital to rehab - requested call back in 2 weeks - unclear how long pt will be in rehab

## 2022-11-17 NOTE — Anesthesia Postprocedure Evaluation (Signed)
Anesthesia Post Note  Patient: Michael Bender  Procedure(s) Performed: IRRIGATION AND DEBRIDEMENT LEFT WRIST (Left)     Patient location during evaluation: PACU Anesthesia Type: General Level of consciousness: awake and alert Pain management: pain level controlled Vital Signs Assessment: post-procedure vital signs reviewed and stable Respiratory status: spontaneous breathing, nonlabored ventilation and respiratory function stable Cardiovascular status: blood pressure returned to baseline and stable Postop Assessment: no apparent nausea or vomiting Anesthetic complications: no   There were no known notable events for this encounter.                  Kadence Mikkelson

## 2022-11-18 ENCOUNTER — Other Ambulatory Visit: Payer: Self-pay | Admitting: Internal Medicine

## 2022-11-18 DIAGNOSIS — G43E11 Chronic migraine with aura, intractable, with status migrainosus: Secondary | ICD-10-CM

## 2022-11-21 ENCOUNTER — Telehealth: Payer: Self-pay | Admitting: Orthopedic Surgery

## 2022-11-21 NOTE — Telephone Encounter (Signed)
Left patient a VM. He needs to be scheduled this week for a 2 week post op with Dr. Fara Boros. Ok to schedule Wednesday or Friday morning.

## 2022-11-22 ENCOUNTER — Telehealth: Payer: Self-pay | Admitting: Orthopedic Surgery

## 2022-11-22 NOTE — Telephone Encounter (Signed)
Patient's son [name] called advised patient is in [facility] in [place] and [facility] would need to schedule an appointment for the patient. The number to contact [name] is [phone]

## 2022-11-23 NOTE — Telephone Encounter (Signed)
Spoke with son in regards to patient. Confirmed appointment with him. Nursing home will be transporting him, they are aware of appointment as well

## 2022-11-25 ENCOUNTER — Telehealth: Payer: Self-pay

## 2022-11-25 DIAGNOSIS — M79651 Pain in right thigh: Secondary | ICD-10-CM | POA: Diagnosis not present

## 2022-11-25 NOTE — Telephone Encounter (Signed)
Per [name] with [facility] patient will be transitioning from [facility] home 12/04/22.  Patient currently on Cefazolin 2 gm Q 8 H Michael Bender T Pricilla Loveless

## 2022-11-28 ENCOUNTER — Ambulatory Visit (INDEPENDENT_AMBULATORY_CARE_PROVIDER_SITE_OTHER): Payer: Medicare HMO | Admitting: Orthopedic Surgery

## 2022-11-28 DIAGNOSIS — M25532 Pain in left wrist: Secondary | ICD-10-CM | POA: Diagnosis not present

## 2022-11-28 NOTE — Progress Notes (Signed)
Michael Bender - 77 y.o. male MRN [id number]  Date of birth: [birth year]  Office Visit Note: Visit Date: 11/28/2022 PCP: Lula Olszewski, MD Referred by: Lula Olszewski, MD  Subjective:  HPI: Michael Bender is a 77 y.o. male who presents today for follow up 2 weeks status post left wrist irrigation and debridement.  He is doing very well postoperatively as far as the wrist goes.  He is still ongoing IV antibiotics through his PICC line for the prior sepsis for which she was hospitalized.  Pain is completely resolved at the left wrist without any restrictions to range of motion.  Pertinent ROS were reviewed with the patient and found to be negative unless otherwise specified above in HPI.   Assessment & Plan: Visit Diagnoses: No diagnosis found.  Plan: He is doing very well postoperatively from the left wrist washout.  Sutures removed today.  Wrist brace was given to be utilized for the next 2 weeks or so for protective measures.  He can come out of the brace for range of motion exercises.  Continue utilizing platform walker as needed.  He can be weightbearing as tolerated to the left wrist moving forward.  Follow-up: No follow-ups on file.   Meds & Orders: No orders of the defined types were placed in this encounter.  No orders of the defined types were placed in this encounter.    Procedures: No procedures performed       Objective:   Vital Signs: There were no vitals taken for this visit.  Ortho Exam Left wrist: - Well-healed dorsal incision, sutures removed today, skin edges well-approximated without erythema or drainage - Range of motion of the right wrist flexion/extension 55/45 without pain, full pronation/supination without restriction, digital motion is full, composite fist without restriction  Imaging: No results found.   Affie Gasner Trevor Mace, M.D. [facility] 2:03 PM

## 2022-12-05 NOTE — Progress Notes (Signed)
  Care Coordination  Outreach Note  12/05/2022 Name: Michael Bender MRN: [id number] DOB: [birth year]   Care Coordination Outreach Attempts: A third unsuccessful outreach was attempted today to offer the patient with information about available care coordination services.  Follow Up Plan:  No further outreach attempts will be made at this time. We have been unable to contact the patient to offer or enroll patient in care coordination services  Encounter Outcome:  No Answer  Burman Nieves, [facility] Care Coordination Care Guide Direct Dial: [phone]

## 2022-12-06 ENCOUNTER — Encounter: Payer: Self-pay | Admitting: Infectious Disease

## 2022-12-06 ENCOUNTER — Ambulatory Visit (INDEPENDENT_AMBULATORY_CARE_PROVIDER_SITE_OTHER): Payer: Medicare HMO | Admitting: Infectious Disease

## 2022-12-06 ENCOUNTER — Other Ambulatory Visit: Payer: Self-pay

## 2022-12-06 VITALS — BP 124/60 | HR 72 | Temp 98.5°F

## 2022-12-06 DIAGNOSIS — M25461 Effusion, right knee: Secondary | ICD-10-CM | POA: Diagnosis not present

## 2022-12-06 DIAGNOSIS — B9561 Methicillin susceptible Staphylococcus aureus infection as the cause of diseases classified elsewhere: Secondary | ICD-10-CM | POA: Diagnosis not present

## 2022-12-06 DIAGNOSIS — M00032 Staphylococcal arthritis, left wrist: Secondary | ICD-10-CM

## 2022-12-06 DIAGNOSIS — M51379 Other intervertebral disc degeneration, lumbosacral region without mention of lumbar back pain or lower extremity pain: Secondary | ICD-10-CM | POA: Diagnosis not present

## 2022-12-06 DIAGNOSIS — Z7185 Encounter for immunization safety counseling: Secondary | ICD-10-CM | POA: Diagnosis not present

## 2022-12-06 DIAGNOSIS — R7881 Bacteremia: Secondary | ICD-10-CM

## 2022-12-06 DIAGNOSIS — Z2809 Immunization not carried out because of other contraindication: Secondary | ICD-10-CM | POA: Diagnosis not present

## 2022-12-06 MED ORDER — CEFADROXIL 500 MG PO CAPS: 1000.0000 mg | ORAL_CAPSULE | Freq: Two times a day (BID) | ORAL | 0 refills | Status: DC

## 2022-12-06 NOTE — Progress Notes (Signed)
Subjective:   CC: followup for MSSA bacteremia and septic wrist, still having knee pain which now is worse   Patient ID: Michael Bender, male    DOB: [birth year], 77 y.o.   MRN: [id number]  HPI   77 y.o. male with history of cervical lumbar spine hardware and recent admission for falls weakness and rhabdomyolysis who was discharged to skilled nursing facility but then brought back with sepsis due to MSSA bacteremia.  He had fallen but also headaches was at pain in his left wrist as well as pain in his right knee and index finger.   He has been proven to have a left septic wrist and now status post I&D.     MRI of the hand shows multifocal osteoarthritic changes throughout the second hand most advanced at the second MCP joint with nonspecific marrow edema enhancement around the second metacarpal phalangeal joint with surrounding soft tissue swelling concerning for inflammatory arthropathy versus septic arthritis   MRI of the C-spine does show some mild reactive endplate changes and marrow edema from C5-C6 which could be degenerative or could be early osteomyelitis discitis.   Orthopedic hand surgery followed him closely in the hospital and orthopedics also evaluated his knee several times and did not feel it was infected.  I had asked them to see him and talk to Dr. Sherrie Sport.  My concern was that while the patient never grew an organism from his knee and while his cell count in the knee at 8500 with 94% neutrophils while NOT typically high value of 50-100K it was NOT normal and we have not alternative explanation for why he had a joint effusion and we knew that he had MSSA bacteremia and we also knew that he had been on antibiotics for several days prior to the knee having been tapped.  [facility] has [facility] residing [facility] receiving cefazolin every 8 hours.  He is been doing relatively well with the exception of the right knee which continues to bother him that fluid has built  up and he also was having reduced range of motion he has pain when he puts weight on it and sometimes if he has vigorous physical therapy the pain goes on for hours afterwards.  He came to clinic accompanied by his son.  They both agree that he needs to be reevaluated by orthopedic surgeon have put in an urgent referral to them.  His left wrist appears to be doing relatively well.  PICC line is clean   Past Medical History:  Diagnosis Date   Arthritis    Back   Blood transfusion    as a child   Chronic back pain    COPD (chronic obstructive pulmonary disease) (HCC)    Disturbance of skin sensation 05/22/2018   Essential hypertension 03/05/2007   Qualifier: Diagnosis of   By: Tawanna Cooler MD, Eugenio Hoes      Gastric erosion    Gastritis and gastroduodenitis    Gastrointestinal hemorrhage with melena 11/20/2018   GIB (gastrointestinal bleeding) 07/18/2019   Headache(784.0)    last one 6 months ago   Hemorrhoids, internal    History of duodenal ulcer    History of lumbar fusion 03/03/2022   RE-OPERATIVE DIAGNOSIS:  lumbar stenosis synovial cyst lumbar spondylosis spondylolisthesis lumbar radiculoapthy L4/5   PROCEDURE:  Procedure(s): POSTERIOR LUMBAR FUSION 1 LEVEL with resection of synovial cyst     History of upper gastrointestinal bleeding 03/03/2022   Duodenal ulcer 2021 Dr. [name]  HLD (hyperlipidemia)    Hypertension    Loss of weight    Wt Readings from Last 10 Encounters:  04/05/22  146 lb (66.2 kg)  03/03/22  143 lb 9.6 oz (65.1 kg)  07/14/21  153 lb (69.4 kg)  12/14/20  147 lb 12.8 oz (67 kg)  11/13/19  154 lb (69.9 kg)  09/16/19  146 lb (66.2 kg)  08/06/19  146 lb (66.2 kg)  07/19/19  144 lb 13.5 oz (65.7 kg)  07/17/19  148 lb (67.1 kg)  07/09/19  147 lb 9.6 oz (67 kg)         Neck rigidity    post cervical fusion   Nocturia    PAIN, CHRONIC NEC 10/06/2006   Qualifier: Diagnosis of   By: Tawanna Cooler RN, Alvino Chapel       Pneumonia    Prostate disease    S/P cervical spinal fusion  03/03/2022   With persistent cervical pain and palpable screws Led to disability History of attempt to dig furrow in skull to fix it Last surgery 1992    Past Surgical History:  Procedure Laterality Date   BIOPSY  07/19/2019   Procedure: BIOPSY;  Surgeon: Shellia Cleverly, DO;  Location: [facility];  Service: Gastroenterology;;   CERVICAL FUSION  1992   C2/C 3  four surgeries   COLONOSCOPY     ESOPHAGOGASTRODUODENOSCOPY  07/20/2011   Procedure: ESOPHAGOGASTRODUODENOSCOPY (EGD);  Surgeon: [name], MD;  Location: [facility];  Service: Endoscopy;  Laterality: N/A;   ESOPHAGOGASTRODUODENOSCOPY (EGD) WITH PROPOFOL N/A 07/19/2019   Procedure: ESOPHAGOGASTRODUODENOSCOPY (EGD) WITH PROPOFOL;  Surgeon: Shellia Cleverly, DO;  Location: [facility];  Service: Gastroenterology;  Laterality: N/A;   I & D EXTREMITY Left 11/09/2022   Procedure: IRRIGATION AND DEBRIDEMENT LEFT WRIST;  Surgeon: Samuella Cota, MD;  Location: [facility];  Service: Orthopedics;  Laterality: Left;   SAVORY DILATION  07/20/2011   Procedure: SAVORY DILATION;  Surgeon: [name], MD;  Location: [facility];  Service: Endoscopy;  Laterality: N/A;   SPINAL FUSION  05/06/11    Family History  Problem Relation Age of Onset   Heart disease Mother    Heart attack Mother 55   Pancreatic cancer Father 13   Pancreatic cancer Brother    Anesthesia problems Neg Hx    Colon cancer Neg Hx    Liver cancer Neg Hx    Stomach cancer Neg Hx    Esophageal cancer Neg Hx    Rectal cancer Neg Hx       Social History   Socioeconomic History   Marital status: Divorced    Spouse name: Not on file   Number of children: 2   Years of education: Not on file   Highest education level: Not on file  Occupational History   Occupation: Disabled  Tobacco Use   Smoking status: Some Days    Current packs/day: 0.00    Types: Cigarettes    Start date: 12/12/1978    Last attempt to quit: 12/12/2018    Years since quitting: 3.9    Smokeless tobacco: Never  Vaping Use   Vaping status: Never Used  Substance and Sexual Activity   Alcohol use: Not Currently    Alcohol/week: 2.0 standard drinks of alcohol    Types: 2 Shots of liquor per week   Drug use: No   Sexual activity: Not on file  Other Topics Concern   Not on file  Social History Narrative   Patient is divorced  2 sons he is a retired Engineer, 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  He drinks about a liter of caffeinated drinks a day past smoker no drug use no alcohol.   Social Determinants of Health   Financial Resource Strain: Low Risk  (10/13/2022)   Overall Financial Resource Strain (CARDIA)    Difficulty of Paying Living Expenses: Not hard at all  Food Insecurity: No Food Insecurity (11/06/2022)   Hunger Vital Sign    Worried About Running Out of Food in the Last Year: Never true    Ran Out of Food in the Last Year: Never true  Transportation Needs: No Transportation Needs (11/06/2022)   PRAPARE - Administrator, Civil Service (Medical): No    Lack of Transportation (Non-Medical): No  Physical Activity: Insufficiently Active (10/13/2022)   Exercise Vital Sign    Days of Exercise per Week: 3 days    Minutes of Exercise per Session: 30 min  Stress: No Stress Concern Present (10/13/2022)   [employer] of Occupational Health - Occupational Stress Questionnaire    Feeling of Stress : Not at all  Social Connections: Socially Isolated (10/13/2022)   Social Connection and Isolation Panel [NHANES]    Frequency of Communication with Friends and Family: Twice a week    Frequency of Social Gatherings with Friends and Family: More than three times a week    Attends Religious Services: Never    Database administrator or Organizations: No    Attends Banker Meetings: Never    Marital Status: Widowed    Allergies  Allergen Reactions   Nubain [Nalbuphine Hcl]     Muscle contraction     Current  Outpatient Medications:    amitriptyline (ELAVIL) 50 MG tablet, Take 50 mg by mouth in the morning, at noon, and at bedtime., Disp: , Rfl:    amLODipine (NORVASC) 10 MG tablet, Take 1 tablet (10 mg total) by mouth daily., Disp: 30 tablet, Rfl: 0   atorvastatin (LIPITOR) 40 MG tablet, Take 1 tablet (40 mg total) by mouth daily., Disp: 30 tablet, Rfl: 0   ceFAZolin (ANCEF) IVPB, Inject 2 g into the vein every 8 (eight) hours. Indication:  MRSA bacteremia, septic L wrist First Dose: Yes Last Day of Therapy:  12/21/22 Labs - Once weekly:  CBC/D and BMP, Labs - Once weekly: ESR and CRP Method of administration: IV Push Method of administration may be changed at the discretion of home infusion pharmacist based upon assessment of the patient and/or caregiver's ability to self-administer the medication ordered., Disp: 111 Units, Rfl: 0   cyclobenzaprine (FLEXERIL) 10 MG tablet, Take 10 mg by mouth 3 (three) times daily as needed., Disp: , Rfl:    metFORMIN (GLUCOPHAGE) 500 MG tablet, Take 1 tablet (500 mg total) by mouth 2 (two) times daily with a meal., Disp: 30 tablet, Rfl: 0   metoprolol tartrate (LOPRESSOR) 25 MG tablet, Take 1 tablet (25 mg total) by mouth 2 (two) times daily., Disp: , Rfl:    Oxycodone HCl 10 MG TABS, Take 1 tablet (10 mg total) by mouth every 8 (eight) hours as needed., Disp: 5 tablet, Rfl: 0   pantoprazole (PROTONIX) 40 MG tablet, TAKE 1 TABLET BY MOUTH EVERY DAY BEFORE BREAKFAST (Patient taking differently: Take 40 mg by mouth daily.), Disp: 90 tablet, Rfl: 0   polyethylene glycol (MIRALAX / GLYCOLAX) 17 g packet, Take 17 g by mouth daily as needed  for moderate constipation., Disp: 14 each, Rfl: 0   potassium chloride SA (KLOR-CON M) 20 MEQ tablet, Take 1 tablet (20 mEq total) by mouth 2 (two) times daily., Disp: 30 tablet, Rfl: 0   rosuvastatin (CRESTOR) 40 MG tablet, Take 40 mg by mouth daily., Disp: , Rfl:    SUMAtriptan (IMITREX) 50 MG tablet, TAKE 1 TABLET (50 MG TOTAL) BY MOUTH  DAILY. MAY REPEAT IN 2 HOURS IF HEADACHE PERSISTS OR RECURS., Disp: 9 tablet, Rfl: 1   tamsulosin (FLOMAX) 0.4 MG CAPS capsule, Take 1 capsule (0.4 mg total) by mouth daily after supper. (Patient not taking: Reported on 11/07/2022), Disp: 30 capsule, Rfl: 0    Review of Systems  Constitutional:  Negative for activity change, appetite change, chills, diaphoresis, fatigue, fever and unexpected weight change.  HENT:  Negative for congestion, rhinorrhea, sinus pressure, sneezing, sore throat and trouble swallowing.   Eyes:  Negative for photophobia and visual disturbance.  Respiratory:  Negative for cough, chest tightness, shortness of breath, wheezing and stridor.   Cardiovascular:  Negative for chest pain, palpitations and leg swelling.  Gastrointestinal:  Negative for abdominal distention, abdominal pain, anal bleeding, blood in stool, constipation, diarrhea, nausea and vomiting.  Genitourinary:  Negative for difficulty urinating, dysuria, flank pain and hematuria.  Musculoskeletal:  Positive for arthralgias and joint swelling. Negative for back pain, gait problem and myalgias.  Skin:  Negative for color change, pallor, rash and wound.  Neurological:  Negative for dizziness, tremors, weakness and light-headedness.  Hematological:  Negative for adenopathy. Does not bruise/bleed easily.  Psychiatric/Behavioral:  Negative for agitation, behavioral problems, confusion, decreased concentration, dysphoric mood and sleep disturbance.        Objective:   Physical Exam Constitutional:      Appearance: He is well-developed.  HENT:     Head: Normocephalic and atraumatic.  Eyes:     Conjunctiva/sclera: Conjunctivae normal.  Cardiovascular:     Rate and Rhythm: Normal rate and regular rhythm.  Pulmonary:     Effort: Pulmonary effort is normal. No respiratory distress.     Breath sounds: No wheezing.  Abdominal:     General: There is no distension.     Palpations: Abdomen is soft.   Musculoskeletal:     Left wrist: No swelling, deformity or tenderness.     Cervical back: Normal range of motion and neck supple.     Right knee: Swelling and effusion present. Decreased range of motion. Tenderness present.  Skin:    General: Skin is warm and dry.     Coloration: Skin is not pale.     Findings: No erythema or rash.  Neurological:     General: No focal deficit present.     Mental Status: He is alert and oriented to person, place, and time.  Psychiatric:        Mood and Affect: Mood normal.        Behavior: Behavior normal.        Thought Content: Thought content normal.        Judgment: Judgment normal.           Assessment & Plan:   MSSA bacteremia with septic left wrist neuritis status post I&D and concerns for right septic knee though orthopedic surgery were not convinced of this.  I am placing urgent referral to orthopedic surgery so that the patient be evaluate by Dr. Cyndie Chime.  In the interim we will plan the patient completing his cefazolin then I will have him start on  cefadroxil in case he has not yet been evaluated by orthopedic surgery.  I have reviewed his labs done at [facility] and his kidney function is normal relatively with a creatinine of 1.32   I am concerned also in reviewing his labs that he has a Sed rate of 112 which is much higher than it should be  CRP is 5 renal function is relatively normal at 1.32          Vaccine counseling recommended updated COVID-19 and flu shot but he did not want these.  I have personally spent 40 minutes involved in face-to-face and non-face-to-face activities for this patient on the day of the visit. Professional time spent includes the following activities: Preparing to see the patient (review of tests), Obtaining and/or reviewing separately obtained history (admission/discharge record), Performing a medically appropriate examination and/or evaluation , Ordering medications/tests/procedures,  referring and communicating with other health care professionals, Documenting clinical information in the EMR, Independently interpreting results (not separately reported), Communicating results to the patient/family/caregiver, Counseling and educating the patient/family/caregiver and Care coordination (not separately reported).

## 2022-12-07 DIAGNOSIS — M1711 Unilateral primary osteoarthritis, right knee: Secondary | ICD-10-CM | POA: Diagnosis not present

## 2022-12-07 DIAGNOSIS — F5102 Adjustment insomnia: Secondary | ICD-10-CM | POA: Diagnosis not present

## 2022-12-08 LAB — FUNGUS CULTURE WITH STAIN

## 2022-12-08 LAB — FUNGUS CULTURE RESULT

## 2022-12-08 LAB — FUNGAL ORGANISM REFLEX

## 2022-12-12 ENCOUNTER — Telehealth: Payer: Self-pay

## 2022-12-12 NOTE — Telephone Encounter (Signed)
Tresa Endo, RN at [employer] [facility] called for clarification on OPAT orders and oral abx. Per Dr.Van Dam note complete IV cefazolinl on 12/21/2022 and then start Cefadroxil 500 mg 2 capsules by mouth 2 times daily. RN verbalized her understanding.   Yasuo Phimmasone Lesli Albee, CMA

## 2022-12-20 ENCOUNTER — Other Ambulatory Visit: Payer: Self-pay | Admitting: *Deleted

## 2022-12-20 NOTE — Patient Outreach (Signed)
Post- Acute Care Manager follow up. [name] resides in [facility] [place] [facility].  Screening for potential chronic care management services as a benefit of health plan and primary care provider. CCM team previously tried to reach Mr. Groot for engagement without success.   Collaboration with Karie Mainland, Programmer, systems. Michael Bender will return home on this Thursday (patient driven). Reports Michael Bender will have outpatient therapy instead of home health. Michael Bender lives alone but has very supportive sons Michael Bender and Michael Bender, who assist with meals, transportation, and medication management.   Telephone call made to Michael Bender on mobile phone [phone]. However, son Michael Bender answered (son/DPR). Patient identifiers confirmed. Explained chronic care management services. Michael Bender is agreeable and states he should be contacted for post SNF calls. Michael Bender states callers should not be confused by how he answers the phone by saying Jeannett Senior. 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 Will pass this information along to the CCM team. Michael Bender states he will return calls if voicemail message is left.   Michael Bender reports Michael Bender will return home alone. However, both he and his brother Michael Bender help Mr. Swiech. States Michael Bender has very supportive neighbors as well. States Michael Bender was very independent prior to recent illness. Michael Bender states he does not have any concerns with Michael Bender returning home now. Michael Bender states between him, his brother, and neighbors, Michael Bender will not have any issues with meals or transportation to MD appointments.   Discussed Clinical research associate will make referral to [facility] chronic care management team upon SNF discharge. Confirmed best contact number is [phone].  Will refer upon SNF discharge.   Raiford Noble, MSN, RN, BSN [place]  [facility], [facility] RN Post- Acute Care Manager Direct Dial: [phone]

## 2022-12-23 ENCOUNTER — Telehealth: Payer: Self-pay

## 2022-12-23 ENCOUNTER — Other Ambulatory Visit: Payer: Self-pay | Admitting: *Deleted

## 2022-12-23 DIAGNOSIS — I10 Essential (primary) hypertension: Secondary | ICD-10-CM

## 2022-12-23 NOTE — Patient Outreach (Signed)
Post-Acute Care Coordinator follow up. Confirmed in [facility] Mr. Ramamurthy discharged from [facility] on 12/22/22.   Jill Side, SNF social worker previously indicated Mr. Hockensmith will have outpatient therapy. Writer spoke with son/DPR Allen Norris on 12/20/22. [name] was agreeable to CCM services. [name] reports he is primary contact at [phone]. [name] asks that callers leave voicemail message if he does not answer. States he has a Administrator, sports but it is his personal cell.   Referral made to [facility] team for RN CM. Mr. Lanctot has medical history of AFIB, DM, PVD, and septic arthritis requiring IV abx in SNF.   Raiford Noble, MSN, RN, BSN   [facility], [facility] RN Post- Acute Care Manager Direct Dial: [phone]

## 2022-12-23 NOTE — Transitions of Care (Post Inpatient/ED Visit) (Signed)
12/23/2022  Name: Michael Bender MRN: [id number] DOB: 04-21-1945  Today's TOC FU Call Status: Today's TOC FU Call Status:: Successful TOC FU Call Completed TOC FU Call Complete Date: 12/23/22 Patient's Name and Date of Birth confirmed.  Transition Care Management Follow-up Telephone Call Date of Discharge: 12/22/22 Discharge Facility: Other [facility]) Name of Other (Non[facility]) Discharge Facility: [facility] Type of Discharge: Inpatient Admission Primary Inpatient Discharge Diagnosis:: arthritis How have you been since you were released from the hospital?: Better Any questions or concerns?: No  Items Reviewed: Did you receive and understand the discharge instructions provided?: Yes Medications obtained,verified, and reconciled?: Yes (Medications Reviewed) Any new allergies since your discharge?: No Dietary orders reviewed?: Yes Do you have support at home?: Yes People in Home: child(ren), adult  Medications Reviewed Today: Medications Reviewed Today     Reviewed by Karena Addison, LPN (Licensed Practical Nurse) on 12/23/22 at 1050  Med List Status: <None>   Medication Order Taking? Sig Documenting Provider Last Dose Status Informant  amitriptyline (ELAVIL) 50 MG tablet [id number] No Take 50 mg by mouth in the morning, at noon, and at bedtime. [provider] Taking Active Child  amLODipine (NORVASC) 10 MG tablet [id number] No Take 1 tablet (10 mg total) by mouth daily. Glade Lloyd, MD Taking Active Child  atorvastatin (LIPITOR) 40 MG tablet [id number] No Take 1 tablet (40 mg total) by mouth daily.  Patient not taking: Reported on 12/06/2022   Marinda Elk, MD Not Taking Active   cefadroxil (DURICEF) 500 MG capsule [id number]  Take 2 capsules (1,000 mg total) by mouth 2 (two) times daily. Daiva Eves, Lisette Grinder, MD  Active   cyclobenzaprine (FLEXERIL) 10 MG tablet [id number] No Take 10 mg by mouth 3 (three) times daily as needed. [provider]  Taking Active Child  metFORMIN (GLUCOPHAGE) 500 MG tablet [id number] No Take 1 tablet (500 mg total) by mouth 2 (two) times daily with a meal. Marinda Elk, MD Taking Active   metoprolol tartrate (LOPRESSOR) 25 MG tablet [id number] No Take 1 tablet (25 mg total) by mouth 2 (two) times daily. Marinda Elk, MD Taking Active   Oxycodone HCl 10 MG TABS [id number] No Take 1 tablet (10 mg total) by mouth every 8 (eight) hours as needed. Marinda Elk, MD Taking Active   pantoprazole (PROTONIX) 40 MG tablet [id number] No TAKE 1 TABLET BY MOUTH EVERY DAY BEFORE BREAKFAST Lula Olszewski, MD Taking Active Child  polyethylene glycol (MIRALAX / GLYCOLAX) 17 g packet [id number] No Take 17 g by mouth daily as needed for moderate constipation. Glade Lloyd, MD Taking Active Child  potassium chloride SA (KLOR-CON M) 20 MEQ tablet [id number] No Take 1 tablet (20 mEq total) by mouth 2 (two) times daily. Glade Lloyd, MD Taking Active Child  rosuvastatin (CRESTOR) 40 MG tablet [id number] No Take 40 mg by mouth daily. [provider] Taking Active Child  SUMAtriptan (IMITREX) 50 MG tablet [id number] No TAKE 1 TABLET (50 MG TOTAL) BY MOUTH DAILY. MAY REPEAT IN 2 HOURS IF HEADACHE PERSISTS OR RECURS.  Patient not taking: Reported on 12/06/2022   Lula Olszewski, MD Not Taking Active   tamsulosin [facility]) 0.4 MG CAPS capsule [id number] No Take 1 capsule (0.4 mg total) by mouth daily after supper. Glade Lloyd, MD Taking Active Child           Med Note Kandis Cocking Kirt Boys Nov 07, 2022  7:26 PM) Patient's son states that he did  not see this medication in his medication counter at home.  traZODone (DESYREL) 100 MG tablet [id number] No Take 100 mg by mouth at bedtime. [provider] Taking Active             Home Care and Equipment/Supplies: Were Home Health Services Ordered?: NA Any new equipment or medical supplies ordered?: NA  Functional Questionnaire: Do you  need assistance with bathing/showering or dressing?: No Do you need assistance with meal preparation?: No Do you need assistance with eating?: No Do you have difficulty maintaining continence: No Do you need assistance with getting out of bed/getting out of a chair/moving?: No Do you have difficulty managing or taking your medications?: No  Follow up appointments reviewed: PCP Follow-up appointment confirmed?: No MD Provider Line Number:[phone] Given:  (patient's son cancelled appt) Follow-up Provider: [facility] Follow-up appointment confirmed?: No Reason Specialist Follow-Up Not Confirmed: Patient has Specialist Provider Number and will Call for Appointment Do you need transportation to your follow-up appointment?: No Do you understand care options if your condition(s) worsen?: Yes-patient verbalized understanding    SIGNATURE Karena Addison, LPN [facility] Nurse Health Advisor Direct Dial [phone]

## 2022-12-24 LAB — ACID FAST CULTURE WITH REFLEXED SENSITIVITIES (MYCOBACTERIA): Acid Fast Culture: NEGATIVE

## 2022-12-26 ENCOUNTER — Telehealth: Payer: Self-pay | Admitting: *Deleted

## 2022-12-26 NOTE — Progress Notes (Signed)
  Care Coordination   Note   12/26/2022 Name: Michael Bender MRN: [id number] DOB: 08/24/1945  Michael Bender is a 77 y.o. year old male who sees Lula Olszewski, MD for primary care. I reached out to [name] by phone today to offer care coordination services.  [name] was given information about Care Coordination services today including:   The Care Coordination services include support from the care team which includes your Nurse Coordinator, Clinical Social Worker, or Pharmacist.  The Care Coordination team is here to help remove barriers to the health concerns and goals most important to you. Care Coordination services are voluntary, and the patient may decline or stop services at any time by request to their care team member.   Care Coordination Consent Status: Patient agreed to services and verbal consent obtained.   Follow up plan:  Telephone appointment with care coordination team member scheduled for:  01/05/2023  Encounter Outcome:  Patient Scheduled from referral   Burman Nieves, [facility] Care Coordination Care Guide Direct Dial: [phone]

## 2022-12-27 NOTE — Progress Notes (Unsigned)
Subjective:  Complaint follow-up for disseminated MSSA infection with concerns for right knee infection with still residual knee pain particular with ambulation  Patient ID: Michael Bender, male    DOB: [birth year], 77 y.o.   MRN: [id number]  HPI  77 y.o. male with history of cervical lumbar spine hardware and recent admission for falls weakness and rhabdomyolysis who was discharged to skilled nursing facility but then brought back with sepsis due to MSSA bacteremia.  He had fallen but also headaches was at pain in his left wrist as well as pain in his right knee and index finger.   He has been proven to have a left septic wrist and now status post I&D.     MRI of the hand shows multifocal osteoarthritic changes throughout the second hand most advanced at the second MCP joint with nonspecific marrow edema enhancement around the second metacarpal phalangeal joint with surrounding soft tissue swelling concerning for inflammatory arthropathy versus septic arthritis   MRI of the C-spine does show some mild reactive endplate changes and marrow edema from C5-C6 which could be degenerative or could be early osteomyelitis discitis.   Orthopedic hand surgery followed him closely in the hospital and orthopedics also evaluated his knee several times and did not feel it was infected.   I had asked them to see him and talk to Dr. Sherrie Sport.  My concern was that while the patient never grew an organism from his knee and while his cell count in the knee at 8500 with 94% neutrophils while NOT typically high value of 50-100K it was NOT normal and we have not alternative explanation for why he had a joint effusion and we knew that he had MSSA bacteremia and we also knew that he had been on antibiotics for several days prior to the knee having been tapped.   [facility] receiving cefazolin every 8 hours.   At last visit he had been doing relatively well with the exception  of the right knee which continues to bother him that fluid has built up and he also was having reduced range of motion he has pain when he puts weight on it and sometimes if he has vigorous physical therapy the pain goes on for hours afterwards. His inflammatory markers were also markedly elevated.  We obtained urgent follow-up with [facility] the following day.  They reportedly were still not on for infection in the knee but did make a follow-up appointment for him  Discussed the use of AI scribe software for clinical note transcription with the patient, who gave verbal consent to proceed.  History of Present Illness   The patient, with a history of multiple surgeries including the neck, back, and left wrist, presents with persistent knee pain. The pain is described as significant and is only present during ambulation. Despite multiple consultations with orthopedic surgeons, no definitive diagnosis or treatment plan has been established for the knee pain. The patient is currently on antibiotics, which complicates the clinical picture as it is unclear how the knee would behave off antibiotics.  The left wrist, which was previously a concern, is reportedly doing well after a course of IV antibiotics. The patient is still being followed by the surgeon for this issue.  The patient also has a history of multiple neck surgeries. An MRI of the cervical spine showed mild reactive endplate and marrow changes, which could be degenerative or potentially indicative of infection. However, the patient reports  no current neck pain.  The patient also reports a recent episode of confusion, which occurred during a hospital stay for a bloodstream infection.   in December, post-MRI.      Past Medical History:  Diagnosis Date   Arthritis    Back   Blood transfusion    as a child   Chronic back pain    COPD (chronic obstructive pulmonary disease) (HCC)    Disturbance of skin sensation 05/22/2018    Essential hypertension 03/05/2007   Qualifier: Diagnosis of   By: Tawanna Cooler MD, Eugenio Hoes      Gastric erosion    Gastritis and gastroduodenitis    Gastrointestinal hemorrhage with melena 11/20/2018   GIB (gastrointestinal bleeding) 07/18/2019   Headache(784.0)    last one 6 months ago   Hemorrhoids, internal    History of duodenal ulcer    History of lumbar fusion 03/03/2022   RE-OPERATIVE DIAGNOSIS:  lumbar stenosis synovial cyst lumbar spondylosis spondylolisthesis lumbar radiculoapthy L4/5   PROCEDURE:  Procedure(s): POSTERIOR LUMBAR FUSION 1 LEVEL with resection of synovial cyst     History of upper gastrointestinal bleeding 03/03/2022   Duodenal ulcer 2021 Dr. [name]   HLD (hyperlipidemia)    Hypertension    Loss of weight    Wt Readings from Last 10 Encounters:  04/05/22  146 lb (66.2 kg)  03/03/22  143 lb 9.6 oz (65.1 kg)  07/14/21  153 lb (69.4 kg)  12/14/20  147 lb 12.8 oz (67 kg)  11/13/19  154 lb (69.9 kg)  09/16/19  146 lb (66.2 kg)  08/06/19  146 lb (66.2 kg)  07/19/19  144 lb 13.5 oz (65.7 kg)  07/17/19  148 lb (67.1 kg)  07/09/19  147 lb 9.6 oz (67 kg)         Neck rigidity    post cervical fusion   Nocturia    PAIN, CHRONIC NEC 10/06/2006   Qualifier: Diagnosis of   By: Tawanna Cooler RN, Alvino Chapel       Pneumonia    Prostate disease    S/P cervical spinal fusion 03/03/2022   With persistent cervical pain and palpable screws Led to disability History of attempt to dig furrow in skull to fix it Last surgery 1992    Past Surgical History:  Procedure Laterality Date   BIOPSY  07/19/2019   Procedure: BIOPSY;  Surgeon: Shellia Cleverly, DO;  Location: [facility];  Service: Gastroenterology;;   CERVICAL FUSION  1992   C2/C 3  four surgeries   COLONOSCOPY     ESOPHAGOGASTRODUODENOSCOPY  07/20/2011   Procedure: ESOPHAGOGASTRODUODENOSCOPY (EGD);  Surgeon: [name], MD;  Location: [facility];  Service: Endoscopy;  Laterality: N/A;   ESOPHAGOGASTRODUODENOSCOPY (EGD) WITH  PROPOFOL N/A 07/19/2019   Procedure: ESOPHAGOGASTRODUODENOSCOPY (EGD) WITH PROPOFOL;  Surgeon: Shellia Cleverly, DO;  Location: [facility];  Service: Gastroenterology;  Laterality: N/A;   I & D EXTREMITY Left 11/09/2022   Procedure: IRRIGATION AND DEBRIDEMENT LEFT WRIST;  Surgeon: Samuella Cota, MD;  Location: [facility] OR;  Service: Orthopedics;  Laterality: Left;   SAVORY DILATION  07/20/2011   Procedure: SAVORY DILATION;  Surgeon: [name], MD;  Location: [facility];  Service: Endoscopy;  Laterality: N/A;   SPINAL FUSION  05/06/11    Family History  Problem Relation Age of Onset   Heart disease Mother    Heart attack Mother 44   Pancreatic cancer Father 70   Pancreatic cancer Brother    Anesthesia problems Neg Hx  Colon cancer Neg Hx    Liver cancer Neg Hx    Stomach cancer Neg Hx    Esophageal cancer Neg Hx    Rectal cancer Neg Hx       Social History   Socioeconomic History   Marital status: Divorced    Spouse name: Not on file   Number of children: 2   Years of education: Not on file   Highest education level: Not on file  Occupational History   Occupation: Disabled  Tobacco Use   Smoking status: Some Days    Current packs/day: 0.00    Types: Cigarettes    Start date: 12/12/1978    Last attempt to quit: 12/12/2018    Years since quitting: 4.0   Smokeless tobacco: Never  Vaping Use   Vaping status: Never Used  Substance and Sexual Activity   Alcohol use: Not Currently    Alcohol/week: 2.0 standard drinks of alcohol    Types: 2 Shots of liquor per week   Drug use: No   Sexual activity: Not on file  Other Topics Concern   Not on file  Social History Narrative   Patient is divorced 2 sons he is a retired Engineer, 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  He drinks about a liter of caffeinated drinks a day past smoker no drug use no alcohol.   Social Determinants of Health   Financial Resource Strain: Low Risk  (10/13/2022)    Overall Financial Resource Strain (CARDIA)    Difficulty of Paying Living Expenses: Not hard at all  Food Insecurity: No Food Insecurity (11/06/2022)   Hunger Vital Sign    Worried About Running Out of Food in the Last Year: Never true    Ran Out of Food in the Last Year: Never true  Transportation Needs: No Transportation Needs (11/06/2022)   PRAPARE - Administrator, Civil Service (Medical): No    Lack of Transportation (Non-Medical): No  Physical Activity: Insufficiently Active (10/13/2022)   Exercise Vital Sign    Days of Exercise per Week: 3 days    Minutes of Exercise per Session: 30 min  Stress: No Stress Concern Present (10/13/2022)   [employer] of Occupational Health - Occupational Stress Questionnaire    Feeling of Stress : Not at all  Social Connections: Socially Isolated (10/13/2022)   Social Connection and Isolation Panel [NHANES]    Frequency of Communication with Friends and Family: Twice a week    Frequency of Social Gatherings with Friends and Family: More than three times a week    Attends Religious Services: Never    Database administrator or Organizations: No    Attends Banker Meetings: Never    Marital Status: Widowed    Allergies  Allergen Reactions   Nubain [Nalbuphine Hcl]     Muscle contraction     Current Outpatient Medications:    amitriptyline (ELAVIL) 50 MG tablet, Take 50 mg by mouth in the morning, at noon, and at bedtime., Disp: , Rfl:    amLODipine (NORVASC) 10 MG tablet, Take 1 tablet (10 mg total) by mouth daily., Disp: 30 tablet, Rfl: 0   atorvastatin (LIPITOR) 40 MG tablet, Take 1 tablet (40 mg total) by mouth daily. (Patient not taking: Reported on 12/06/2022), Disp: 30 tablet, Rfl: 0   cefadroxil (DURICEF) 500 MG capsule, Take 2 capsules (1,000 mg total) by mouth 2 (two) times daily., Disp: 120 capsule, Rfl:  0   cyclobenzaprine (FLEXERIL) 10 MG tablet, Take 10 mg by mouth 3 (three) times daily as needed., Disp: ,  Rfl:    metFORMIN (GLUCOPHAGE) 500 MG tablet, Take 1 tablet (500 mg total) by mouth 2 (two) times daily with a meal., Disp: 30 tablet, Rfl: 0   metoprolol tartrate (LOPRESSOR) 25 MG tablet, Take 1 tablet (25 mg total) by mouth 2 (two) times daily., Disp: , Rfl:    Oxycodone HCl 10 MG TABS, Take 1 tablet (10 mg total) by mouth every 8 (eight) hours as needed., Disp: 5 tablet, Rfl: 0   pantoprazole (PROTONIX) 40 MG tablet, TAKE 1 TABLET BY MOUTH EVERY DAY BEFORE BREAKFAST, Disp: 90 tablet, Rfl: 0   polyethylene glycol (MIRALAX / GLYCOLAX) 17 g packet, Take 17 g by mouth daily as needed for moderate constipation., Disp: 14 each, Rfl: 0   potassium chloride SA (KLOR-CON M) 20 MEQ tablet, Take 1 tablet (20 mEq total) by mouth 2 (two) times daily., Disp: 30 tablet, Rfl: 0   rosuvastatin (CRESTOR) 40 MG tablet, Take 40 mg by mouth daily., Disp: , Rfl:    SUMAtriptan (IMITREX) 50 MG tablet, TAKE 1 TABLET (50 MG TOTAL) BY MOUTH DAILY. MAY REPEAT IN 2 HOURS IF HEADACHE PERSISTS OR RECURS. (Patient not taking: Reported on 12/06/2022), Disp: 9 tablet, Rfl: 1   tamsulosin (FLOMAX) 0.4 MG CAPS capsule, Take 1 capsule (0.4 mg total) by mouth daily after supper., Disp: 30 capsule, Rfl: 0   traZODone (DESYREL) 100 MG tablet, Take 100 mg by mouth at bedtime., Disp: , Rfl:    Review of Systems  Constitutional:  Negative for activity change, appetite change, chills, diaphoresis, fatigue, fever and unexpected weight change.  HENT:  Negative for congestion, rhinorrhea, sinus pressure, sneezing, sore throat and trouble swallowing.   Eyes:  Negative for photophobia and visual disturbance.  Respiratory:  Negative for cough, chest tightness, shortness of breath, wheezing and stridor.   Cardiovascular:  Negative for chest pain, palpitations and leg swelling.  Gastrointestinal:  Negative for abdominal distention, abdominal pain, anal bleeding, blood in stool, constipation, diarrhea, nausea and vomiting.  Genitourinary:   Negative for difficulty urinating, dysuria, flank pain and hematuria.  Musculoskeletal:  Positive for arthralgias and joint swelling. Negative for back pain, gait problem and myalgias.  Skin:  Negative for color change, pallor, rash and wound.  Neurological:  Negative for dizziness, tremors, weakness and light-headedness.  Hematological:  Negative for adenopathy. Does not bruise/bleed easily.  Psychiatric/Behavioral:  Negative for agitation, behavioral problems, confusion, decreased concentration, dysphoric mood and sleep disturbance.        Objective:   Physical Exam Constitutional:      Appearance: He is well-developed.  HENT:     Head: Normocephalic and atraumatic.  Eyes:     Conjunctiva/sclera: Conjunctivae normal.  Cardiovascular:     Rate and Rhythm: Normal rate and regular rhythm.  Pulmonary:     Effort: Pulmonary effort is normal. No respiratory distress.     Breath sounds: No wheezing.  Abdominal:     General: There is no distension.     Palpations: Abdomen is soft.  Musculoskeletal:        General: No tenderness.     Cervical back: Normal range of motion and neck supple.  Skin:    General: Skin is warm and dry.     Coloration: Skin is not pale.     Findings: No erythema or rash.  Neurological:     General: No focal deficit  present.     Mental Status: He is alert and oriented to person, place, and time.  Psychiatric:        Mood and Affect: Mood normal.        Behavior: Behavior normal.        Thought Content: Thought content normal.        Judgment: Judgment normal.    Right knee 12/28/22:    Left wrist 12/28/2022:         Assessment & Plan:       Assessment and Plan    Knee Pain Persistent despite reassurance from orthopedic surgeons. Pain is present with movement, not at rest. Patient is currently on antibiotics, (cefadroxil which I am prescribing)  which may be masking a potential infection. -Order MRI of the knee to further evaluate. -Check  inflammatory markers today. -Consider discontinuing antibiotics to assess if knee pain worsens, indicating possible infection. but if we do this we will  have him make sure he has meds on hand to take reactively  Wrist Infection (septic arthritis) Patient was on IV antibiotics for a wrist infection, now on oral Cefadroxil 4 times a day. Wrist is reportedly doing well. -Continue Cefadroxil as prescribed. -Ensure patient has enough supply of antibiotics.  Cervical Spine History of multiple surgeries. Recent MRI showed mild reactive end plate and marrow changes, possibly degenerative or infectious. Patient reports no current neck pain. -No immediate action required, continue to monitor. And this would be a reason for continuing antibiotics longer   General Health Maintenance -Declined flu and COVID-19 vaccines. -Schedule follow-up appointment

## 2022-12-28 ENCOUNTER — Other Ambulatory Visit: Payer: Self-pay

## 2022-12-28 ENCOUNTER — Telehealth: Payer: Self-pay

## 2022-12-28 ENCOUNTER — Ambulatory Visit: Payer: Medicare HMO | Admitting: Infectious Disease

## 2022-12-28 VITALS — BP 169/62 | HR 93 | Wt 120.0 lb

## 2022-12-28 DIAGNOSIS — M00032 Staphylococcal arthritis, left wrist: Secondary | ICD-10-CM | POA: Diagnosis not present

## 2022-12-28 DIAGNOSIS — M51379 Other intervertebral disc degeneration, lumbosacral region without mention of lumbar back pain or lower extremity pain: Secondary | ICD-10-CM

## 2022-12-28 DIAGNOSIS — R7881 Bacteremia: Secondary | ICD-10-CM | POA: Diagnosis not present

## 2022-12-28 DIAGNOSIS — T847XXD Infection and inflammatory reaction due to other internal orthopedic prosthetic devices, implants and grafts, subsequent encounter: Secondary | ICD-10-CM

## 2022-12-28 DIAGNOSIS — Z8673 Personal history of transient ischemic attack (TIA), and cerebral infarction without residual deficits: Secondary | ICD-10-CM

## 2022-12-28 DIAGNOSIS — M00061 Staphylococcal arthritis, right knee: Secondary | ICD-10-CM | POA: Diagnosis not present

## 2022-12-28 DIAGNOSIS — B9561 Methicillin susceptible Staphylococcus aureus infection as the cause of diseases classified elsewhere: Secondary | ICD-10-CM | POA: Diagnosis not present

## 2022-12-28 DIAGNOSIS — M4642 Discitis, unspecified, cervical region: Secondary | ICD-10-CM | POA: Diagnosis not present

## 2022-12-28 DIAGNOSIS — Z7185 Encounter for immunization safety counseling: Secondary | ICD-10-CM

## 2022-12-28 NOTE — Telephone Encounter (Signed)
-----   Message from Paulette Blanch Dam sent at 12/28/2022  4:30 PM EST ----- I left a message on the son's voicemail which was one of the locations where detailed messages could be left.  I stated that his hemoglobin did drop to 7.1 which is close to the transfusion threshold in asymptomatic people advised if he needs repeat CBC on Monday at the latest and certainly if he develops symptoms of anemia would need a blood transfusion in the interim ----- Message ----- From: Interface, [facility] Results In Sent: 12/28/2022   4:16 PM EST To: Randall Hiss, MD

## 2022-12-28 NOTE — Telephone Encounter (Signed)
Spoke to patient son [name]. [name] stated that patient hasn't c/o any lightheadedness, dizziness or chest pain. [name] aware if patient has sx's over the weekend they can call on call ID provider and will need to go to ED for blood transfusion. Otherwise patient is scheduled on Monday 12/2 at 11 AM for repeat labs.    Wilmot Quevedo Lesli Albee, CMA

## 2022-12-29 LAB — COMPLETE METABOLIC PANEL WITH GFR
AG Ratio: 1.1 (calc) (ref 1.0–2.5)
ALT: <3 U/L — ABNORMAL LOW (ref 9–46)
AST: 12 U/L (ref 10–35)
Albumin: 3.5 g/dL — ABNORMAL LOW (ref 3.6–5.1)
Alkaline phosphatase (APISO): 118 U/L (ref 35–144)
BUN/Creatinine Ratio: 15 (calc) (ref 6–22)
BUN: 27 mg/dL — ABNORMAL HIGH (ref 7–25)
CO2: 18 mmol/L — ABNORMAL LOW (ref 20–32)
Calcium: 8.5 mg/dL — ABNORMAL LOW (ref 8.6–10.3)
Chloride: 113 mmol/L — ABNORMAL HIGH (ref 98–110)
Creat: 1.79 mg/dL — ABNORMAL HIGH (ref 0.70–1.28)
Globulin: 3.2 g/dL (ref 1.9–3.7)
Glucose, Bld: 135 mg/dL — ABNORMAL HIGH (ref 65–99)
Potassium: 5.1 mmol/L (ref 3.5–5.3)
Sodium: 139 mmol/L (ref 135–146)
Total Bilirubin: 0.2 mg/dL (ref 0.2–1.2)
Total Protein: 6.7 g/dL (ref 6.1–8.1)
eGFR: 38 mL/min/{1.73_m2} — ABNORMAL LOW (ref >=60)

## 2022-12-29 LAB — CBC WITH DIFFERENTIAL/PLATELET
Absolute Lymphocytes: 1042 {cells}/uL (ref 850–3900)
Absolute Monocytes: 465 {cells}/uL (ref 200–950)
Basophils Absolute: 22 {cells}/uL (ref 0–200)
Basophils Relative: 0.4 %
Eosinophils Absolute: 196 {cells}/uL (ref 15–500)
Eosinophils Relative: 3.5 %
HCT: 22.2 % — ABNORMAL LOW (ref 38.5–50.0)
Hemoglobin: 7.1 g/dL — ABNORMAL LOW (ref 13.2–17.1)
MCH: 30.9 pg (ref 27.0–33.0)
MCHC: 32 g/dL (ref 32.0–36.0)
MCV: 96.5 fL (ref 80.0–100.0)
MPV: 9.4 fL (ref 7.5–12.5)
Monocytes Relative: 8.3 %
Neutro Abs: 3875 {cells}/uL (ref 1500–7800)
Neutrophils Relative %: 69.2 %
Platelets: 205 10*3/uL (ref 140–400)
RBC: 2.3 10*6/uL — ABNORMAL LOW (ref 4.20–5.80)
RDW: 13.1 % (ref 11.0–15.0)
Total Lymphocyte: 18.6 %
WBC: 5.6 10*3/uL (ref 3.8–10.8)

## 2022-12-29 LAB — SEDIMENTATION RATE: Sed Rate: 67 mm/h — ABNORMAL HIGH (ref 0–20)

## 2022-12-29 LAB — C-REACTIVE PROTEIN: CRP: 32.2 mg/L — ABNORMAL HIGH (ref <8.0)

## 2023-01-02 ENCOUNTER — Other Ambulatory Visit: Payer: Self-pay

## 2023-01-02 ENCOUNTER — Telehealth: Payer: Self-pay

## 2023-01-02 ENCOUNTER — Other Ambulatory Visit: Payer: Medicare HMO

## 2023-01-02 DIAGNOSIS — M00032 Staphylococcal arthritis, left wrist: Secondary | ICD-10-CM

## 2023-01-02 DIAGNOSIS — M00061 Staphylococcal arthritis, right knee: Secondary | ICD-10-CM

## 2023-01-02 DIAGNOSIS — R7881 Bacteremia: Secondary | ICD-10-CM

## 2023-01-02 DIAGNOSIS — M4642 Discitis, unspecified, cervical region: Secondary | ICD-10-CM

## 2023-01-02 LAB — CBC WITH DIFFERENTIAL/PLATELET
Abs Immature Granulocytes: 0.02 10*3/uL (ref 0.00–0.07)
Basophils Absolute: 0 10*3/uL (ref 0.0–0.1)
Basophils Relative: 0 %
Eosinophils Absolute: 0.2 10*3/uL (ref 0.0–0.5)
Eosinophils Relative: 3 %
HCT: 25.2 % — ABNORMAL LOW (ref 39.0–52.0)
Hemoglobin: 7.9 g/dL — ABNORMAL LOW (ref 13.0–17.0)
Immature Granulocytes: 0 %
Lymphocytes Relative: 29 %
Lymphs Abs: 1.7 10*3/uL (ref 0.7–4.0)
MCH: 30.9 pg (ref 26.0–34.0)
MCHC: 31.3 g/dL (ref 30.0–36.0)
MCV: 98.4 fL (ref 80.0–100.0)
Monocytes Absolute: 0.6 10*3/uL (ref 0.1–1.0)
Monocytes Relative: 10 %
Neutro Abs: 3.5 10*3/uL (ref 1.7–7.7)
Neutrophils Relative %: 58 %
Platelets: 214 10*3/uL (ref 150–400)
RBC: 2.56 MIL/uL — ABNORMAL LOW (ref 4.22–5.81)
RDW: 14.1 % (ref 11.5–15.5)
WBC: 6.1 10*3/uL (ref 4.0–10.5)
nRBC: 0 % (ref 0.0–0.2)

## 2023-01-02 NOTE — Telephone Encounter (Signed)
Patient son Bradon Karman aware of improving hemoglobin. [name] did want to ask if there is something contributing to patient hemoglobin dropping. I asked if patient noticed any blood when using the bathroom - patient hasn't mentioned it. Informed [name] I would relay message to Dr.Van Dam. In the mean time to keep an eye on patient if he starts experiencing any lightheadedness, dizziness, chest pain.    Shahidah Nesbitt Lesli Albee, CMA

## 2023-01-02 NOTE — Telephone Encounter (Signed)
-----   Message from [place] sent at 01/02/2023 12:53 PM EST ----- Regarding: hemoglobin is better nearly 8 now. no need for transfusion currently if not symptomatic  ----- Message ----- From: Leory Plowman, Lab In [place] Sent: 01/02/2023  11:53 AM EST To: Randall Hiss, MD

## 2023-01-02 NOTE — Telephone Encounter (Signed)
Patient son [name] aware of message above. Patient is at home, left SNF about a week ago. [name] will schedule follow up with PCP.    Shawnique Mariotti Lesli Albee, CMA

## 2023-01-05 ENCOUNTER — Inpatient Hospital Stay: Payer: Medicare HMO | Admitting: Internal Medicine

## 2023-01-05 ENCOUNTER — Ambulatory Visit: Payer: Self-pay

## 2023-01-05 ENCOUNTER — Ambulatory Visit (HOSPITAL_COMMUNITY)
Admission: RE | Admit: 2023-01-05 | Discharge: 2023-01-05 | Disposition: A | Discharge status: Home / Self Care | Payer: Medicare HMO | Source: Ambulatory Visit | Attending: Infectious Disease | Admitting: Infectious Disease

## 2023-01-05 DIAGNOSIS — M25461 Effusion, right knee: Secondary | ICD-10-CM | POA: Diagnosis not present

## 2023-01-05 DIAGNOSIS — M1711 Unilateral primary osteoarthritis, right knee: Secondary | ICD-10-CM | POA: Diagnosis not present

## 2023-01-05 DIAGNOSIS — M00061 Staphylococcal arthritis, right knee: Secondary | ICD-10-CM | POA: Insufficient documentation

## 2023-01-05 MED ORDER — GADOBUTROL 1 MMOL/ML IV SOLN
6.0000 mL | Freq: Once | INTRAVENOUS | Status: AC | PRN
  Administered 2023-01-05: 6 mL via INTRAVENOUS

## 2023-01-05 NOTE — Patient Outreach (Signed)
  Care Coordination   Initial Visit Note   01/05/2023 Name: LANSING AGRAWAL MRN: [id number] DOB: 13-Aug-1945  TERESA CIANCIOLA is a 77 y.o. year old male who sees Lula Olszewski, MD for primary care. I spoke with  [name] by phone today. And son [name].  What matters to the patients health and wellness today?  Get to root of infection    Goals Addressed             This Visit's Progress    Get Sepsis Cleared       Patient Goals/Self Care Activities: -Patient/Caregiver will take medications as prescribed   -Patient/Caregiver will attend all scheduled provider appointments -Patient/Caregiver will call provider office for new concerns or questions    Get MRI  and antibiotics completed  Spoke with Hadley Pen and patient.  Patient is doing very well at this point per son.  He lives alone and walks independently.  Son takes to appointments and manages medications.  Patient with history of knee infection , the doctor thinks caused the sepsis and ambulation ability.  MRI scheduled for today.  Son and patient in agreement to get through process of possible infection site and continue nurse calls and support.          SDOH assessments and interventions completed:  Yes  SDOH Interventions Today    Flowsheet Row Most Recent Value  SDOH Interventions   Food Insecurity Interventions Intervention Not Indicated  Housing Interventions Intervention Not Indicated  Transportation Interventions Intervention Not Indicated  Utilities Interventions Intervention Not Indicated        Care Coordination Interventions:  Yes, provided   Follow up plan: Follow up call scheduled for January    Encounter Outcome:  Patient Visit Completed   Bary Leriche, RN, MSN RN Care Manager [facility], Population Health Direct Dial: [phone]  Fax: [phone] Website: [email or web address]

## 2023-01-05 NOTE — Patient Instructions (Signed)
Visit Information  Thank you for taking time to visit with me today. Please don't hesitate to contact me if I can be of assistance to you.   Following are the goals we discussed today:   Goals Addressed             This Visit's Progress    Get Sepsis Cleared       Patient Goals/Self Care Activities: -Patient/Caregiver will take medications as prescribed   -Patient/Caregiver will attend all scheduled provider appointments -Patient/Caregiver will call provider office for new concerns or questions    Get MRI  and antibiotics completed  Spoke with Hadley Pen and patient.  Patient is doing very well at this point per son.  He lives alone and walks independently.  Son takes to appointments and manages medications.  Patient with history of knee infection , the doctor thinks caused the sepsis and ambulation ability.  MRI scheduled for today.  Son and patient in agreement to get through process of possible infection site and continue nurse calls and support.          Our next appointment is by telephone on 02/09/23 at 1030 am  Please call the care guide team at [phone] if you need to cancel or reschedule your appointment.   If you are experiencing a Mental Health or Behavioral Health Crisis or need someone to talk to, please call the Suicide and Crisis Lifeline: 988   Patient verbalizes understanding of instructions and care plan provided today and agrees to view in MyChart. Active MyChart status and patient understanding of how to access instructions and care plan via MyChart confirmed with patient.     The patient has been provided with contact information for the care management team and has been advised to call with any health related questions or concerns.   Bary Leriche, RN, MSN RN Care Manager [facility], Population Health Direct Dial: [phone]  Fax: [phone] Website: [email or web address]

## 2023-01-16 ENCOUNTER — Encounter: Payer: Self-pay | Admitting: Infectious Disease

## 2023-01-16 DIAGNOSIS — M25461 Effusion, right knee: Secondary | ICD-10-CM

## 2023-01-16 HISTORY — DX: Effusion, right knee: M25.461

## 2023-01-16 NOTE — Progress Notes (Signed)
Subjective:   Chief Complaint: follow-up for disseminated MSSA infection with bacteremia, left septic wrist and concerns for right knee infection   Patient ID: Michael Bender, male    DOB: Dec 06, 1945, 77 y.o.   MRN: [id number]  HPI  77 y.o. male with history of cervical lumbar spine hardware and recent admission for falls weakness and rhabdomyolysis who was discharged to skilled nursing facility but then brought back with sepsis due to MSSA bacteremia.  He had fallen but also headaches was at pain in his left wrist as well as pain in his right knee and index finger.   He has been proven to have a left septic wrist and now status post I&D.     MRI of the hand shows multifocal osteoarthritic changes throughout the second hand most advanced at the second MCP joint with nonspecific marrow edema enhancement around the second metacarpal phalangeal joint with surrounding soft tissue swelling concerning for inflammatory arthropathy versus septic arthritis   MRI of the C-spine does show some mild reactive endplate changes and marrow edema from C5-C6 which could be degenerative or could be early osteomyelitis discitis.   Orthopedic hand surgery followed him closely in the hospital and orthopedics also evaluated his knee several times and did not feel it was infected.   I had asked them to see him and talk to Dr. Sherrie Sport.  My concern was that while the patient never grew an organism from his knee and while his cell count in the knee at 8500 with 94% neutrophils while NOT typically high value of 50-100K it was NOT normal and we have not alternative explanation for why he had a joint effusion and we knew that he had MSSA bacteremia and we also knew that he had been on antibiotics for several days prior to the knee having been tapped.    Interim history:   "He had [facility] residing [facility] receiving cefazolin every 8 hours.   At last visit he had been doing relatively well with  the exception of the right knee which continues to bother him that fluid has built up and he also was having reduced range of motion he has pain when he puts weight on it and sometimes if he has vigorous physical therapy the pain goes on for hours afterwards. His inflammatory markers were also markedly elevated.  We obtained urgent follow-up with [facility] the following day.  They reportedly were still not thinking that he had evidence for infection in the knee but did make a follow-up appointment for him  Have the report from that visit with Dr. Sherrie Sport see below      Dr. Hulda Humphrey saw the patient again on the 4th but I do not have access to those notes.  I ordered MRI of the knee which was completed on 01/05/2023 but was read this morning on  1217/2024    IMPRESSION: 1. Worsening of the prior tear in the posterior horn of the medial meniscus, now with a large radial component in the posterior horn adjacent to the meniscal root. 2. Worsening of the subcortical stress fracture or osteochondral lesion anteromedially along the medial femoral condyle with worsened adjacent marrow edema. New subcortical stress fracture or osteochondral lesion along the medial tibial plateau with surrounding marrow edema. 3. Moderate to large knee joint effusion with mildly worsened synovitis compared to previous, currently moderate. Given the worsening I cannot confidently exclude the possibility of early septic joint, and arthrocentesis may be warranted if  there is clinical suspicion. 4. 0.8 cm curvilinear suspected free osteochondral fragment just posterior to the lateral portion of the medial femoral condyle. 5. Moderate to severe tricompartmental osteoarthritis.     The patient and his son (1 of 2 who accompany him from time to time) are voicing desire for 2nd opinion and I am frankly frustrated that it has been more than 2 months if not longer. I dont understand why a bacteremic pt with a septic  wrist w a knee effusion with 8k cells on abx would not be considered potentially infected and it would seem prudent to have an I and D at minimum arthroscopically.  I will reach out and see if I can find someone for a 2nd opinion. If not will reconnect with Dr. Hulda Humphrey  I may also soon take him OFF of antibiotics and if I cannot find an orthopedist to work on him aspirate his knee off of antibiotics 2-4 weeks after stopping abx.  His knee pain incidentally is continuing to worsen particularly when he bears weight.    Past Medical History:  Diagnosis Date   Arthritis    Back   Blood transfusion    as a child   Chronic back pain    COPD (chronic obstructive pulmonary disease) (HCC)    Disturbance of skin sensation 05/22/2018   Essential hypertension 03/05/2007   Qualifier: Diagnosis of   By: Tawanna Cooler MD, Eugenio Hoes      Gastric erosion    Gastritis and gastroduodenitis    Gastrointestinal hemorrhage with melena 11/20/2018   GIB (gastrointestinal bleeding) 07/18/2019   Headache(784.0)    last one 6 months ago   Hemorrhoids, internal    History of duodenal ulcer    History of lumbar fusion 03/03/2022   RE-OPERATIVE DIAGNOSIS:  lumbar stenosis synovial cyst lumbar spondylosis spondylolisthesis lumbar radiculoapthy L4/5   PROCEDURE:  Procedure(s): POSTERIOR LUMBAR FUSION 1 LEVEL with resection of synovial cyst     History of upper gastrointestinal bleeding 03/03/2022   Duodenal ulcer 2021 Dr. [name]   HLD (hyperlipidemia)    Hypertension    Loss of weight    Wt Readings from Last 10 Encounters:  04/05/22  146 lb (66.2 kg)  03/03/22  143 lb 9.6 oz (65.1 kg)  07/14/21  153 lb (69.4 kg)  12/14/20  147 lb 12.8 oz (67 kg)  11/13/19  154 lb (69.9 kg)  09/16/19  146 lb (66.2 kg)  08/06/19  146 lb (66.2 kg)  07/19/19  144 lb 13.5 oz (65.7 kg)  07/17/19  148 lb (67.1 kg)  07/09/19  147 lb 9.6 oz (67 kg)         Neck rigidity    post cervical fusion   Nocturia    PAIN, CHRONIC NEC 10/06/2006    Qualifier: Diagnosis of   By: Tawanna Cooler RN, Alvino Chapel       Pneumonia    Prostate disease    S/P cervical spinal fusion 03/03/2022   With persistent cervical pain and palpable screws Led to disability History of attempt to dig furrow in skull to fix it Last surgery 1992    Past Surgical History:  Procedure Laterality Date   BIOPSY  07/19/2019   Procedure: BIOPSY;  Surgeon: Shellia Cleverly, DO;  Location: [facility];  Service: Gastroenterology;;   CERVICAL FUSION  1992   C2/C 3  four surgeries   COLONOSCOPY     ESOPHAGOGASTRODUODENOSCOPY  07/20/2011   Procedure: ESOPHAGOGASTRODUODENOSCOPY (EGD);  Surgeon: [name], MD;  Location: [facility];  Service: Endoscopy;  Laterality: N/A;   ESOPHAGOGASTRODUODENOSCOPY (EGD) WITH PROPOFOL N/A 07/19/2019   Procedure: ESOPHAGOGASTRODUODENOSCOPY (EGD) WITH PROPOFOL;  Surgeon: Shellia Cleverly, DO;  Location: [facility];  Service: Gastroenterology;  Laterality: N/A;   I & D EXTREMITY Left 11/09/2022   Procedure: IRRIGATION AND DEBRIDEMENT LEFT WRIST;  Surgeon: Samuella Cota, MD;  Location: [facility];  Service: Orthopedics;  Laterality: Left;   SAVORY DILATION  07/20/2011   Procedure: SAVORY DILATION;  Surgeon: [name], MD;  Location: [facility];  Service: Endoscopy;  Laterality: N/A;   SPINAL FUSION  05/06/11    Family History  Problem Relation Age of Onset   Heart disease Mother    Heart attack Mother 27   Pancreatic cancer Father 92   Pancreatic cancer Brother    Anesthesia problems Neg Hx    Colon cancer Neg Hx    Liver cancer Neg Hx    Stomach cancer Neg Hx    Esophageal cancer Neg Hx    Rectal cancer Neg Hx       Social History   Socioeconomic History   Marital status: Divorced    Spouse name: Not on file   Number of children: 2   Years of education: Not on file   Highest education level: Not on file  Occupational History   Occupation: Disabled  Tobacco Use   Smoking status: Some Days    Current packs/day: 0.00     Types: Cigarettes    Start date: 12/12/1978    Last attempt to quit: 12/12/2018    Years since quitting: 4.0   Smokeless tobacco: Never  Vaping Use   Vaping status: Never Used  Substance and Sexual Activity   Alcohol use: Not Currently    Alcohol/week: 2.0 standard drinks of alcohol    Types: 2 Shots of liquor per week   Drug use: No   Sexual activity: Not on file  Other Topics Concern   Not on file  Social History Narrative   Patient is divorced 2 sons he is a retired Engineer, 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  He drinks about a liter of caffeinated drinks a day past smoker no drug use no alcohol.   Social Drivers of Corporate investment banker Strain: Low Risk  (10/13/2022)   Overall Financial Resource Strain (CARDIA)    Difficulty of Paying Living Expenses: Not hard at all  Food Insecurity: No Food Insecurity (01/05/2023)   Hunger Vital Sign    Worried About Running Out of Food in the Last Year: Never true    Ran Out of Food in the Last Year: Never true  Transportation Needs: No Transportation Needs (01/05/2023)   PRAPARE - Administrator, Civil Service (Medical): No    Lack of Transportation (Non-Medical): No  Physical Activity: Insufficiently Active (10/13/2022)   Exercise Vital Sign    Days of Exercise per Week: 3 days    Minutes of Exercise per Session: 30 min  Stress: No Stress Concern Present (10/13/2022)   [employer] of Occupational Health - Occupational Stress Questionnaire    Feeling of Stress : Not at all  Social Connections: Socially Isolated (10/13/2022)   Social Connection and Isolation Panel [NHANES]    Frequency of Communication with Friends and Family: Twice a week    Frequency of Social Gatherings with Friends and Family: More than three times a week    Attends Religious  Services: Never    Active Member of Clubs or Organizations: No    Attends Banker Meetings: Never    Marital Status:  Widowed    Allergies  Allergen Reactions   Nubain [Nalbuphine Hcl]     Muscle contraction     Current Outpatient Medications:    amitriptyline (ELAVIL) 50 MG tablet, Take 50 mg by mouth in the morning, at noon, and at bedtime., Disp: , Rfl:    amLODipine (NORVASC) 10 MG tablet, Take 1 tablet (10 mg total) by mouth daily., Disp: 30 tablet, Rfl: 0   atorvastatin (LIPITOR) 40 MG tablet, Take 1 tablet (40 mg total) by mouth daily. (Patient not taking: Reported on 12/06/2022), Disp: 30 tablet, Rfl: 0   cefadroxil (DURICEF) 500 MG capsule, Take 2 capsules (1,000 mg total) by mouth 2 (two) times daily., Disp: 120 capsule, Rfl: 0   cyclobenzaprine (FLEXERIL) 10 MG tablet, Take 10 mg by mouth 3 (three) times daily as needed., Disp: , Rfl:    metFORMIN (GLUCOPHAGE) 500 MG tablet, Take 1 tablet (500 mg total) by mouth 2 (two) times daily with a meal., Disp: 30 tablet, Rfl: 0   metoprolol tartrate (LOPRESSOR) 25 MG tablet, Take 1 tablet (25 mg total) by mouth 2 (two) times daily., Disp: , Rfl:    Oxycodone HCl 10 MG TABS, Take 1 tablet (10 mg total) by mouth every 8 (eight) hours as needed., Disp: 5 tablet, Rfl: 0   pantoprazole (PROTONIX) 40 MG tablet, TAKE 1 TABLET BY MOUTH EVERY DAY BEFORE BREAKFAST, Disp: 90 tablet, Rfl: 0   polyethylene glycol (MIRALAX / GLYCOLAX) 17 g packet, Take 17 g by mouth daily as needed for moderate constipation., Disp: 14 each, Rfl: 0   potassium chloride SA (KLOR-CON M) 20 MEQ tablet, Take 1 tablet (20 mEq total) by mouth 2 (two) times daily., Disp: 30 tablet, Rfl: 0   rosuvastatin (CRESTOR) 40 MG tablet, Take 40 mg by mouth daily., Disp: , Rfl:    SUMAtriptan (IMITREX) 50 MG tablet, TAKE 1 TABLET (50 MG TOTAL) BY MOUTH DAILY. MAY REPEAT IN 2 HOURS IF HEADACHE PERSISTS OR RECURS. (Patient not taking: Reported on 12/06/2022), Disp: 9 tablet, Rfl: 1   tamsulosin (FLOMAX) 0.4 MG CAPS capsule, Take 1 capsule (0.4 mg total) by mouth daily after supper., Disp: 30 capsule, Rfl: 0    traZODone (DESYREL) 100 MG tablet, Take 100 mg by mouth at bedtime., Disp: , Rfl:    Review of Systems  Constitutional:  Negative for activity change, appetite change, chills, diaphoresis, fatigue, fever and unexpected weight change.  HENT:  Negative for congestion, rhinorrhea, sinus pressure, sneezing, sore throat and trouble swallowing.   Eyes:  Negative for photophobia and visual disturbance.  Respiratory:  Negative for cough, chest tightness, shortness of breath, wheezing and stridor.   Cardiovascular:  Negative for chest pain, palpitations and leg swelling.  Gastrointestinal:  Negative for abdominal distention, abdominal pain, anal bleeding, blood in stool, constipation, diarrhea, nausea and vomiting.  Genitourinary:  Negative for difficulty urinating, dysuria, flank pain and hematuria.  Musculoskeletal:  Positive for arthralgias and joint swelling. Negative for back pain, gait problem and myalgias.  Skin:  Negative for color change, pallor, rash and wound.  Neurological:  Negative for dizziness, tremors, weakness and light-headedness.  Hematological:  Negative for adenopathy. Does not bruise/bleed easily.  Psychiatric/Behavioral:  Negative for agitation, behavioral problems, confusion, decreased concentration, dysphoric mood and sleep disturbance.        Objective:   Physical Exam  Constitutional:      Appearance: He is well-developed.  HENT:     Head: Normocephalic and atraumatic.  Eyes:     Conjunctiva/sclera: Conjunctivae normal.  Cardiovascular:     Rate and Rhythm: Normal rate and regular rhythm.  Pulmonary:     Effort: Pulmonary effort is normal. No respiratory distress.     Breath sounds: No wheezing.  Abdominal:     General: There is no distension.     Palpations: Abdomen is soft.  Musculoskeletal:        General: No tenderness. Normal range of motion.     Cervical back: Normal range of motion and neck supple.  Skin:    General: Skin is warm and dry.      Coloration: Skin is not pale.     Findings: No erythema or rash.  Neurological:     General: No focal deficit present.     Mental Status: He is alert and oriented to person, place, and time.  Psychiatric:        Mood and Affect: Mood normal.        Behavior: Behavior normal.        Thought Content: Thought content normal.        Judgment: Judgment normal.    Right knee 12/28/22:     Right knee 01/17/2023:     Knee 01/17/2023:    Left wrist 12/28/2022:         Assessment & Plan:   77 year old patient admitted with MSSA bacteremia and sepsis with left septic wrist status post I&D also with knee effusion with over 8000 white blood cells when tapped on IV antibiotics.  I remain concerned that the knee is infected his left wrist appears cured and orthopedic surgery hand have reviewed least him.  Of note he also had imaging of his spine performed which showed possibly some reactive endplate marrow changes in the C-spine thought to be degenerative versus infectious.  At present today we will get a sed rate CRP CBC with differential and BMP with GFR.  I will continue cefadroxil for now.  I will endeavor to get him a second opinion but if that is not successful we will reengage with Dr. Hulda Humphrey and advocate strongly for surgery.  If we are still not making progress I will stop his antibiotics and have knee aspirated by IR a month after stopping  I have personally spent 44 minutes involved in face-to-face and non-face-to-face activities for this patient on the day of the visit. Professional time spent includes the following activities: Preparing to see the patient (review of tests), Obtaining and/or reviewing separately obtained history (admission/discharge record), Performing a medically appropriate examination and/or evaluation , Ordering medications/tests/procedures, referring and communicating with other health care professionals, Documenting clinical information in the EMR,  Independently interpreting results (not separately reported), Communicating results to the patient/family/caregiver, Counseling and educating the patient/family/caregiver and Care coordination (not separately reported).

## 2023-01-17 ENCOUNTER — Other Ambulatory Visit: Payer: Self-pay

## 2023-01-17 ENCOUNTER — Ambulatory Visit: Payer: Medicare HMO | Admitting: Infectious Disease

## 2023-01-17 VITALS — BP 181/59 | HR 79 | Temp 96.6°F | Ht 71.0 in | Wt 147.0 lb

## 2023-01-17 DIAGNOSIS — M00032 Staphylococcal arthritis, left wrist: Secondary | ICD-10-CM | POA: Diagnosis not present

## 2023-01-17 DIAGNOSIS — M25461 Effusion, right knee: Secondary | ICD-10-CM | POA: Diagnosis not present

## 2023-01-17 DIAGNOSIS — Z8673 Personal history of transient ischemic attack (TIA), and cerebral infarction without residual deficits: Secondary | ICD-10-CM | POA: Diagnosis not present

## 2023-01-17 DIAGNOSIS — T847XXD Infection and inflammatory reaction due to other internal orthopedic prosthetic devices, implants and grafts, subsequent encounter: Secondary | ICD-10-CM

## 2023-01-17 DIAGNOSIS — Z981 Arthrodesis status: Secondary | ICD-10-CM | POA: Diagnosis not present

## 2023-01-17 MED ORDER — CEFADROXIL 500 MG PO CAPS: 1000.0000 mg | ORAL_CAPSULE | Freq: Two times a day (BID) | ORAL | 2 refills | Status: DC

## 2023-01-18 ENCOUNTER — Other Ambulatory Visit: Payer: Self-pay | Admitting: Internal Medicine

## 2023-01-18 LAB — COMPLETE METABOLIC PANEL WITH GFR
AG Ratio: 1.3 (calc) (ref 1.0–2.5)
ALT: 6 U/L — ABNORMAL LOW (ref 9–46)
AST: 19 U/L (ref 10–35)
Albumin: 3.7 g/dL (ref 3.6–5.1)
Alkaline phosphatase (APISO): 106 U/L (ref 35–144)
BUN/Creatinine Ratio: 12 (calc) (ref 6–22)
BUN: 21 mg/dL (ref 7–25)
CO2: 26 mmol/L (ref 20–32)
Calcium: 8.7 mg/dL (ref 8.6–10.3)
Chloride: 107 mmol/L (ref 98–110)
Creat: 1.71 mg/dL — ABNORMAL HIGH (ref 0.70–1.28)
Globulin: 2.9 g/dL (ref 1.9–3.7)
Glucose, Bld: 167 mg/dL — ABNORMAL HIGH (ref 65–99)
Potassium: 4.7 mmol/L (ref 3.5–5.3)
Sodium: 141 mmol/L (ref 135–146)
Total Bilirubin: 0.4 mg/dL (ref 0.2–1.2)
Total Protein: 6.6 g/dL (ref 6.1–8.1)
eGFR: 40 mL/min/{1.73_m2} — ABNORMAL LOW (ref >=60)

## 2023-01-18 LAB — CBC WITH DIFFERENTIAL/PLATELET
Absolute Lymphocytes: 2050 {cells}/uL (ref 850–3900)
Absolute Monocytes: 521 {cells}/uL (ref 200–950)
Basophils Absolute: 39 {cells}/uL (ref 0–200)
Basophils Relative: 0.7 %
Eosinophils Absolute: 353 {cells}/uL (ref 15–500)
Eosinophils Relative: 6.3 %
HCT: 26.1 % — ABNORMAL LOW (ref 38.5–50.0)
Hemoglobin: 8.3 g/dL — ABNORMAL LOW (ref 13.2–17.1)
MCH: 30.9 pg (ref 27.0–33.0)
MCHC: 31.8 g/dL — ABNORMAL LOW (ref 32.0–36.0)
MCV: 97 fL (ref 80.0–100.0)
MPV: 10.4 fL (ref 7.5–12.5)
Monocytes Relative: 9.3 %
Neutro Abs: 2638 {cells}/uL (ref 1500–7800)
Neutrophils Relative %: 47.1 %
Platelets: 181 10*3/uL (ref 140–400)
RBC: 2.69 10*6/uL — ABNORMAL LOW (ref 4.20–5.80)
RDW: 13.1 % (ref 11.0–15.0)
Total Lymphocyte: 36.6 %
WBC: 5.6 10*3/uL (ref 3.8–10.8)

## 2023-01-18 LAB — SEDIMENTATION RATE: Sed Rate: 36 mm/h — ABNORMAL HIGH (ref 0–20)

## 2023-01-18 LAB — C-REACTIVE PROTEIN: CRP: 5.6 mg/L (ref <8.0)

## 2023-01-19 ENCOUNTER — Encounter: Payer: Self-pay | Admitting: Internal Medicine

## 2023-01-19 ENCOUNTER — Ambulatory Visit (INDEPENDENT_AMBULATORY_CARE_PROVIDER_SITE_OTHER): Payer: Medicare HMO | Admitting: Internal Medicine

## 2023-01-19 VITALS — BP 116/50 | HR 72 | Temp 98.3°F | Ht 71.0 in | Wt 147.0 lb

## 2023-01-19 DIAGNOSIS — M71161 Other infective bursitis, right knee: Secondary | ICD-10-CM | POA: Diagnosis not present

## 2023-01-19 DIAGNOSIS — M25461 Effusion, right knee: Secondary | ICD-10-CM | POA: Diagnosis not present

## 2023-01-19 DIAGNOSIS — M5441 Lumbago with sciatica, right side: Secondary | ICD-10-CM | POA: Diagnosis not present

## 2023-01-19 DIAGNOSIS — R413 Other amnesia: Secondary | ICD-10-CM | POA: Diagnosis not present

## 2023-01-19 DIAGNOSIS — G8929 Other chronic pain: Secondary | ICD-10-CM

## 2023-01-19 DIAGNOSIS — B9689 Other specified bacterial agents as the cause of diseases classified elsewhere: Secondary | ICD-10-CM | POA: Diagnosis not present

## 2023-01-19 DIAGNOSIS — M5442 Lumbago with sciatica, left side: Secondary | ICD-10-CM | POA: Diagnosis not present

## 2023-01-19 MED ORDER — OXYCODONE HCL 10 MG PO TABS: 10.0000 mg | ORAL_TABLET | Freq: Three times a day (TID) | ORAL | 0 refills | Status: DC | PRN

## 2023-01-19 NOTE — Assessment & Plan Note (Signed)
Memory Loss very mild noticed today.  They are experiencing recent memory issues, possibly related to infection or other underlying conditions, with differential diagnoses including infection-related cognitive impairment or neurological issues. An MRI is needed to assess for cerebral abscess or other abnormalities. We will order a stat MRI of the brain to assess for infection or other abnormalities.

## 2023-01-19 NOTE — Progress Notes (Signed)
[facility]: [phone]   -- Medical Office Visit --  Patient:  Michael Bender      Age: 77 y.o.       Sex:  male  Date:   01/19/2023 Today's Healthcare Provider: Lula Olszewski, MD  =======================   CHIEF COMPLAINT: Hospitalization Follow-up (For arthritic sepsis. ) and Discuss pain management issue (They are refusing to refill pain meds and don't want to see him. May want to go to a different facility.)    Assessment & Plan Septic infrapatellar bursitis of right knee Agree with concern(s) of infectious disease specialist Dr. Daiva Eves that septic arthritis may not be resolving despite treatment of mssa found on culture- further I suspect undetected alternative organism stemming from a deep fish hook injury through left hand that was treated at  the time 06/2022 with antibiotics.  The differential diagnosis includes rare or resistant bacterial infection or even nonbacterial infections. We discussed the risks of untreated septic arthritis, such as joint damage and systemic infection. An urgent orthopedic evaluation and potential surgical intervention are recommended to evaluate for a sooner than planned re-aspiration and special cultures.  We will try to assist by placing an urgent referral to the renowned orthopedic knee specialist Dr. Lequita Halt urgently, continue current antibiotics, and monitor for worsening symptoms, considering an ER visit if there is no improvement within a few days and/or we are unable to get urgent outpatient follow up.  Sharing this encounter with Dr. Daiva Eves.  Agree that wrist infection seems to have simmered down if not totally resolved.  but knee is hurting a lot and swelling more since leaving hospital making me concerned for recurrent vs undetected active infection.    However labwork recently  did show ESR coming down, hemoglobin coming up,  creat stable Photographs Taken 01/19/2023 :not obvious in this photo but R  knee is much larger.    MRI  2 days ago of the knee:  IMPRESSION: 1. Worsening of the prior tear in the posterior horn of the medial meniscus, now with a large radial component in the posterior horn adjacent to the meniscal root. 2. Worsening of the subcortical stress fracture or osteochondral lesion anteromedially along the medial femoral condyle with worsened adjacent marrow edema. New subcortical stress fracture or osteochondral lesion along the medial tibial plateau with surrounding marrow edema. 3. Moderate to large knee joint effusion with mildly worsened synovitis compared to previous, currently moderate. Given the worsening I cannot confidently exclude the possibility of early septic joint, and arthrocentesis may be warranted if there is clinical suspicion. 4. 0.8 cm curvilinear suspected free osteochondral fragment just posterior to the lateral portion of the medial femoral condyle. 5. Moderate to severe tricompartmental osteoarthritis.  Memory loss Memory Loss very mild noticed today.  They are experiencing recent memory issues, possibly related to infection or other underlying conditions, with differential diagnoses including infection-related cognitive impairment or neurological issues. An MRI is needed to assess for cerebral abscess or other abnormalities. We will order a stat MRI of the brain to assess for infection or other abnormalities. Chronic bilateral low back pain with bilateral sciatica Pain Management With their previous pain management provider unavailable and currently on a reduced dose of oxycodone (10 mg three times a day), we discussed the risks of opioid use and the benefits of pain control. There is a need for a new pain management provider and interim monthly follow-ups. We will prescribe oxycodone 10 mg three times  a day, 90 tabs, refer them to a new pain management clinic, and follow up monthly for pain management until a new clinic is established. Effusion of right knee  joint      Orders Placed During this Encounter:   Orders Placed This Encounter  Procedures   MR Brain W Wo Contrast    Memory loss, recent infections sepsis septic arthritis rule out cerebral abscess [employer]ET[employer] MCR  EPIC  WT: 174 HT: 5'11 / NO NEEDS/NO CLAUS/** PT AWARE OF 75$ NO SHOW FEE**NO METAL REMOVED BY DOCTOR/ NO IMPLANTS/ NO BULLETS OR BB'S/ NO GLUCOSE MONITOR, STIMULATOR OR INJECTORS DEFIBRULLATOR NO PORT NO BRAIN CLIP / NO PREV SX/ NO BRAIN HEART EYE OR EAR SX / NKDA TO IV DYE Floyce Stakes MD [facility]  JW 01/19/23    Standing Status:   Future    Expiration Date:   01/19/2024    If indicated for the ordered procedure, I authorize the administration of contrast media per Radiology protocol:   Yes    What is the patient's sedation requirement?:   No Sedation    Does the patient have a pacemaker or implanted devices?:   No    Preferred imaging location?:   [facility] (table limit-550lbs)   Ambulatory referral to Pain Clinic    Referral Priority:   Routine    Referral Type:   Consultation    Referral Reason:   Specialty Services Required    Requested Specialty:   Pain Medicine    Number of Visits Requested:   1   Ambulatory referral to Orthopedic Surgery    Referral Priority:   Urgent    Referral Type:   Surgical    Referral Reason:   Specialty Services Required    Referred to Provider:   Ollen Gross, MD    Requested Specialty:   Orthopedic Surgery    Number of Visits Requested:   1   Meds ordered this encounter  Medications   DISCONTD: Oxycodone HCl 10 MG TABS    Sig: Take 1 tablet (10 mg total) by mouth every 8 (eight) hours as needed.    Dispense:  90 tablet    Refill:  0   Oxycodone HCl 10 MG TABS    Sig: Take 1 tablet (10 mg total) by mouth every 8 (eight) hours as needed.    Dispense:  90 tablet    Refill:  0   Follow-up They are to follow up with Dr. Wyonia Hough on January 22 and consider an ER visit if there is no improvement or worsening of symptoms  before the orthopedic appointment.   SUBJECTIVE: 77 y.o. male who has Pulmonary emphysema (HCC); GERD; CONSTIPATION, CHRONIC; DISC DISEASE, LUMBAR; Bilateral leg pain; Esophageal dysphagia; Right sided temporal headache; Insomnia; PAD (peripheral artery disease) (HCC); Senile ecchymosis; History of CVA (cerebrovascular accident); HLD (hyperlipidemia); COPD (chronic obstructive pulmonary disease) (HCC); Iron deficiency anemia; Duodenal stenosis; Chronic narcotic dependence (HCC); Atherosclerosis of aorta (HCC); History of fusion of cervical spine; History of lumbar fusion; High risk medication use; Intractable pain; Gynecomastia, male; Cachexia (HCC); Underweight on examination; Chronic low back pain with bilateral sciatica; AKI (acute kidney injury) (HCC); Syncope; MSSA bacteremia; Septic infrapatellar bursitis of right knee; Staphylococcal arthritis of left wrist (HCC); Infected blister of left index finger; Staphylococcal arthritis of right knee (HCC); Neck pain; Finger pain, left; Septic arthritis of wrist, left (HCC); Cervical discitis; Hardware complicating wound infection (HCC); Effusion of right knee joint; and Memory loss on their problem  list.  History of Present Illness The patient, with a history of septic arthritis, presents with a persistently swollen right knee and a history of a recent infection in the left wrist. The patient reports that the knee swelling and discomfort have been progressively worsening, with increased discomfort upon walking. The patient also reports a history of a fishing hook injury to the hand, which was treated with antibiotics. However, the patient does not recall which hand was injured but I believe it was the left hand.  The patient also reports a recent hospitalization due to an infected joint, which was treated with antibiotics. Despite this, the patient's knee condition has not improved and the swelling has persisted. The patient also reports that the left wrist,  which was previously infected, is not fully recovered and remains tender.  The patient has been under the care of a pain management specialist for chronic pain management, with a regimen of oxycodone. However, due to the absence of the treating physician, the patient has been unable to continue with the same care. The patient reports a reduction in the dosage of oxycodone from 30mg  three times a day to 10mg  three times a day.  The patient lives independently with assistance from family members. However, there are concerns about the patient's memory, as the patient has had difficulty recalling certain events and details. The patient's memory issues have been noted to coincide with the recent infections and septic arthritis.  The patient has been referred to an orthopedic specialist for further management of the chronic pain and the persistently swollen knee, respectively. However, this has not transpired.  Note that patient  has a past medical history of Arthritis, Blood transfusion, Chronic back pain, COPD (chronic obstructive pulmonary disease) (HCC), Disturbance of skin sensation (05/22/2018), Effusion of right knee joint (01/16/2023), Essential hypertension (03/05/2007), Gastric erosion, Gastritis and gastroduodenitis, Gastrointestinal hemorrhage with melena (11/20/2018), GIB (gastrointestinal bleeding) (07/18/2019), Headache(784.0), Hemorrhoids, internal, History of duodenal ulcer, History of lumbar fusion (03/03/2022), History of upper gastrointestinal bleeding (03/03/2022), HLD (hyperlipidemia), Hypertension, Loss of weight, Neck rigidity, Nocturia, PAIN, CHRONIC NEC (10/06/2006), Pneumonia, Prostate disease, and S/P cervical spinal fusion (03/03/2022).  Problem list overviews that were updated at today's visit: Problem  Memory Loss    Med reconciliation: Current Outpatient Medications on File Prior to Visit  Medication Sig   amitriptyline (ELAVIL) 50 MG tablet Take 50 mg by mouth in the  morning, at noon, and at bedtime.   amLODipine (NORVASC) 10 MG tablet Take 1 tablet (10 mg total) by mouth daily.   atorvastatin (LIPITOR) 40 MG tablet Take 1 tablet (40 mg total) by mouth daily.   cefadroxil (DURICEF) 500 MG capsule Take 2 capsules (1,000 mg total) by mouth 2 (two) times daily.   cyclobenzaprine (FLEXERIL) 10 MG tablet Take 10 mg by mouth 3 (three) times daily as needed.   metFORMIN (GLUCOPHAGE) 500 MG tablet Take 1 tablet (500 mg total) by mouth 2 (two) times daily with a meal.   metoprolol tartrate (LOPRESSOR) 25 MG tablet Take 1 tablet (25 mg total) by mouth 2 (two) times daily.   pantoprazole (PROTONIX) 40 MG tablet TAKE 1 TABLET BY MOUTH EVERY DAY BEFORE BREAKFAST   polyethylene glycol (MIRALAX / GLYCOLAX) 17 g packet Take 17 g by mouth daily as needed for moderate constipation.   potassium chloride SA (KLOR-CON M) 20 MEQ tablet Take 1 tablet (20 mEq total) by mouth 2 (two) times daily.   rosuvastatin (CRESTOR) 40 MG tablet Take 40 mg by mouth  daily.   SUMAtriptan (IMITREX) 50 MG tablet TAKE 1 TABLET (50 MG TOTAL) BY MOUTH DAILY. MAY REPEAT IN 2 HOURS IF HEADACHE PERSISTS OR RECURS.   tamsulosin (FLOMAX) 0.4 MG CAPS capsule Take 1 capsule (0.4 mg total) by mouth daily after supper.   traZODone (DESYREL) 100 MG tablet Take 100 mg by mouth at bedtime.   No current facility-administered medications on file prior to visit.   Medications Discontinued During This Encounter  Medication Reason   oxyCODONE (ROXICODONE) 15 MG immediate release tablet Expired Prescription   Oxycodone HCl 10 MG TABS Reorder   Oxycodone HCl 10 MG TABS Reorder      Objective   Physical Exam     01/19/2023   10:13 AM 01/19/2023    9:57 AM 01/17/2023   10:11 AM  Vitals with BMI  Height  5\' 11"  5\' 11"   Weight  147 lbs 147 lbs  BMI  20.51 20.51  Systolic 116 126 295  Diastolic 50 52 59  Pulse  72 79   Wt Readings from Last 10 Encounters:  01/19/23 147 lb (66.7 kg)  01/17/23 147 lb (66.7  kg)  12/28/22 120 lb (54.4 kg)  11/05/22 134 lb 14.7 oz (61.2 kg)  11/02/22 134 lb 14.7 oz (61.2 kg)  10/13/22 141 lb (64 kg)  06/03/22 141 lb 12.8 oz (64.3 kg)  05/03/22 142 lb 12.8 oz (64.8 kg)  04/05/22 146 lb (66.2 kg)  03/03/22 143 lb 9.6 oz (65.1 kg)   Vital signs reviewed.  Nursing notes reviewed. Weight trend reviewed. Abnormalities and Problem-Specific physical exam findings:  very swollen tender, but not erythematous R knee, and very mild memory / cognition issue almost didn't notice.  General Appearance:  No acute distress appreciable.   Well-groomed, healthy-appearing male.  Well proportioned with no abnormal fat distribution.  Good muscle tone. Pulmonary:  Normal work of breathing at rest, no respiratory distress apparent. SpO2: 95 %  Musculoskeletal: All extremities are intact.  Neurological:  Awake, alert, oriented, and engaged.  No obvious focal neurological deficits or cognitive impairments.  Sensorium seems unclouded.   Speech is clear and coherent with logical content. Psychiatric:  Appropriate mood, pleasant and cooperative demeanor, thoughtful and engaged during the exam    No results found for any visits on 01/19/23. Office Visit on 01/17/2023  Component Date Value   CRP 01/17/2023 5.6    Sed Rate 01/17/2023 36 (H)    Glucose, Bld 01/17/2023 167 (H)    BUN 01/17/2023 21    Creat 01/17/2023 1.71 (H)    eGFR 01/17/2023 40 (L)    BUN/Creatinine Ratio 01/17/2023 12    Sodium 01/17/2023 141    Potassium 01/17/2023 4.7    Chloride 01/17/2023 107    CO2 01/17/2023 26    Calcium 01/17/2023 8.7    Total Protein 01/17/2023 6.6    Albumin 01/17/2023 3.7    Globulin 01/17/2023 2.9    AG Ratio 01/17/2023 1.3    Total Bilirubin 01/17/2023 0.4    Alkaline phosphatase (AP* 01/17/2023 106    AST 01/17/2023 19    ALT 01/17/2023 6 (L)    WBC 01/17/2023 5.6    RBC 01/17/2023 2.69 (L)    Hemoglobin 01/17/2023 8.3 (L)    HCT 01/17/2023 26.1 (L)    MCV 01/17/2023 97.0     MCH 01/17/2023 30.9    MCHC 01/17/2023 31.8 (L)    RDW 01/17/2023 13.1    Platelets 01/17/2023 181    MPV 01/17/2023 10.4  Neutro Abs 01/17/2023 2,638    Absolute Lymphocytes 01/17/2023 2,050    Absolute Monocytes 01/17/2023 521    Eosinophils Absolute 01/17/2023 353    Basophils Absolute 01/17/2023 39    Neutrophils Relative % 01/17/2023 47.1    Total Lymphocyte 01/17/2023 36.6    Monocytes Relative 01/17/2023 9.3    Eosinophils Relative 01/17/2023 6.3    Basophils Relative 01/17/2023 0.7   Lab on 01/02/2023  Component Date Value   WBC 01/02/2023 6.1    RBC 01/02/2023 2.56 (L)    Hemoglobin 01/02/2023 7.9 (L)    HCT 01/02/2023 25.2 (L)    MCV 01/02/2023 98.4    MCH 01/02/2023 30.9    MCHC 01/02/2023 31.3    RDW 01/02/2023 14.1    Platelets 01/02/2023 214    nRBC 01/02/2023 0.0    Neutrophils Relative % 01/02/2023 58    Neutro Abs 01/02/2023 3.5    Lymphocytes Relative 01/02/2023 29    Lymphs Abs 01/02/2023 1.7    Monocytes Relative 01/02/2023 10    Monocytes Absolute 01/02/2023 0.6    Eosinophils Relative 01/02/2023 3    Eosinophils Absolute 01/02/2023 0.2    Basophils Relative 01/02/2023 0    Basophils Absolute 01/02/2023 0.0    Immature Granulocytes 01/02/2023 0    Abs Immature Granulocytes 01/02/2023 0.02   Office Visit on 12/28/2022  Component Date Value   CRP 12/28/2022 32.2 (H)    Sed Rate 12/28/2022 67 (H)    Glucose, Bld 12/28/2022 135 (H)    BUN 12/28/2022 27 (H)    Creat 12/28/2022 1.79 (H)    eGFR 12/28/2022 38 (L)    BUN/Creatinine Ratio 12/28/2022 15    Sodium 12/28/2022 139    Potassium 12/28/2022 5.1    Chloride 12/28/2022 113 (H)    CO2 12/28/2022 18 (L)    Calcium 12/28/2022 8.5 (L)    Total Protein 12/28/2022 6.7    Albumin 12/28/2022 3.5 (L)    Globulin 12/28/2022 3.2    AG Ratio 12/28/2022 1.1    Total Bilirubin 12/28/2022 0.2    Alkaline phosphatase (AP* 12/28/2022 118    AST 12/28/2022 12    ALT 12/28/2022 <3 (L)    WBC  12/28/2022 5.6    RBC 12/28/2022 2.30 (L)    Hemoglobin 12/28/2022 7.1 (L)    HCT 12/28/2022 22.2 (L)    MCV 12/28/2022 96.5    MCH 12/28/2022 30.9    MCHC 12/28/2022 32.0    RDW 12/28/2022 13.1    Platelets 12/28/2022 205    MPV 12/28/2022 9.4    Neutro Abs 12/28/2022 3,875    Absolute Lymphocytes 12/28/2022 1,042    Absolute Monocytes 12/28/2022 465    Eosinophils Absolute 12/28/2022 196    Basophils Absolute 12/28/2022 22    Neutrophils Relative % 12/28/2022 69.2    Total Lymphocyte 12/28/2022 18.6    Monocytes Relative 12/28/2022 8.3    Eosinophils Relative 12/28/2022 3.5    Basophils Relative 12/28/2022 0.4   No results displayed because visit has over 200 results.    Admission on 10/27/2022, Discharged on 11/02/2022  Component Date Value   WBC 10/27/2022 5.4    RBC 10/27/2022 3.37 (L)    Hemoglobin 10/27/2022 10.5 (L)    HCT 10/27/2022 30.7 (L)    MCV 10/27/2022 91.1    MCH 10/27/2022 31.2    MCHC 10/27/2022 34.2    RDW 10/27/2022 13.2    Platelets 10/27/2022 133 (L)    nRBC 10/27/2022 0.0    Color, Urine  10/27/2022 YELLOW    APPearance 10/27/2022 CLEAR    Specific Gravity, Urine 10/27/2022 1.010    pH 10/27/2022 6.0    Glucose, UA 10/27/2022 NEGATIVE    Hgb urine dipstick 10/27/2022 LARGE (A)    Bilirubin Urine 10/27/2022 NEGATIVE    Ketones, ur 10/27/2022 NEGATIVE    Protein, ur 10/27/2022 100 (A)    Nitrite 10/27/2022 NEGATIVE    Leukocytes,Ua 10/27/2022 NEGATIVE    Sodium 10/27/2022 134 (L)    Potassium 10/27/2022 2.1 (LL)    Chloride 10/27/2022 99    CO2 10/27/2022 24    Glucose, Bld 10/27/2022 126 (H)    BUN 10/27/2022 26 (H)    Creatinine, Ser 10/27/2022 4.21 (H)    Calcium 10/27/2022 7.4 (L)    Total Protein 10/27/2022 6.5    Albumin 10/27/2022 2.9 (L)    AST 10/27/2022 126 (H)    ALT 10/27/2022 49 (H)    Alkaline Phosphatase 10/27/2022 153 (H)    Total Bilirubin 10/27/2022 0.2 (L)    GFR, Estimated 10/27/2022 14 (L)    Anion gap 10/27/2022 11     Magnesium 10/27/2022 1.9    RBC / HPF 10/27/2022 11-20    WBC, UA 10/27/2022 0-5    Bacteria, UA 10/27/2022 NONE SEEN    Squamous Epithelial / HPF 10/27/2022 0-5    Total CK 10/27/2022 2,828 (H)    Sodium 10/28/2022 138    Potassium 10/28/2022 2.6 (LL)    Chloride 10/28/2022 104    CO2 10/28/2022 20 (L)    Glucose, Bld 10/28/2022 82    BUN 10/28/2022 25 (H)    Creatinine, Ser 10/28/2022 3.62 (H)    Calcium 10/28/2022 7.6 (L)    GFR, Estimated 10/28/2022 17 (L)    Anion gap 10/28/2022 14    Magnesium 10/28/2022 2.5 (H)    Phosphorus 10/28/2022 3.7    Total CK 10/28/2022 2,043 (H)    Total Protein 10/28/2022 5.5 (L)    Albumin 10/28/2022 2.4 (L)    AST 10/28/2022 95 (H)    ALT 10/28/2022 39    Alkaline Phosphatase 10/28/2022 134 (H)    Total Bilirubin 10/28/2022 0.5    Bilirubin, Direct 10/28/2022 0.2    Indirect Bilirubin 10/28/2022 0.3    Vitamin B-12 10/28/2022 676    Sodium 10/29/2022 142    Potassium 10/29/2022 3.2 (L)    Chloride 10/29/2022 106    CO2 10/29/2022 24    Glucose, Bld 10/29/2022 147 (H)    BUN 10/29/2022 24 (H)    Creatinine, Ser 10/29/2022 3.19 (H)    Calcium 10/29/2022 7.8 (L)    GFR, Estimated 10/29/2022 19 (L)    Anion gap 10/29/2022 12    Total CK 10/29/2022 992 (H)    Folate 10/30/2022 13.1    Iron 10/30/2022 46    TIBC 10/30/2022 223 (L)    Saturation Ratios 10/30/2022 21    UIBC 10/30/2022 177    Ferritin 10/30/2022 224    Retic Ct Pct 10/30/2022 0.6    RBC. 10/30/2022 3.41 (L)    Retic Count, Absolute 10/30/2022 19.8    Immature Retic Fract 10/30/2022 1.8 (L)    Sodium 10/30/2022 143    Potassium 10/30/2022 3.6    Chloride 10/30/2022 101    CO2 10/30/2022 27    Glucose, Bld 10/30/2022 183 (H)    BUN 10/30/2022 17    Creatinine, Ser 10/30/2022 2.28 (H)    Calcium 10/30/2022 8.2 (L)    GFR, Estimated 10/30/2022 29 (L)  Anion gap 10/30/2022 15    Total CK 10/30/2022 802 (H)    WBC 10/31/2022 4.5    RBC 10/31/2022 3.53 (L)     Hemoglobin 10/31/2022 11.3 (L)    HCT 10/31/2022 32.5 (L)    MCV 10/31/2022 92.1    MCH 10/31/2022 32.0    MCHC 10/31/2022 34.8    RDW 10/31/2022 13.1    Platelets 10/31/2022 144 (L)    nRBC 10/31/2022 0.0    Sodium 10/31/2022 140    Potassium 10/31/2022 3.6    Chloride 10/31/2022 104    CO2 10/31/2022 28    Glucose, Bld 10/31/2022 124 (H)    BUN 10/31/2022 13    Creatinine, Ser 10/31/2022 2.13 (H)    Calcium 10/31/2022 8.1 (L)    Total Protein 10/31/2022 5.8 (L)    Albumin 10/31/2022 2.5 (L)    AST 10/31/2022 114 (H)    ALT 10/31/2022 37    Alkaline Phosphatase 10/31/2022 130 (H)    Total Bilirubin 10/31/2022 0.8    GFR, Estimated 10/31/2022 31 (L)    Anion gap 10/31/2022 8    Total CK 10/31/2022 818 (H)    Total CK 11/01/2022 482 (H)    Sodium 11/01/2022 139    Potassium 11/01/2022 2.6 (LL)    Chloride 11/01/2022 98    CO2 11/01/2022 28    Glucose, Bld 11/01/2022 128 (H)    BUN 11/01/2022 11    Creatinine, Ser 11/01/2022 1.65 (H)    Calcium 11/01/2022 8.0 (L)    Total Protein 11/01/2022 5.6 (L)    Albumin 11/01/2022 2.4 (L)    AST 11/01/2022 146 (H)    ALT 11/01/2022 43    Alkaline Phosphatase 11/01/2022 121    Total Bilirubin 11/01/2022 0.2 (L)    GFR, Estimated 11/01/2022 43 (L)    Anion gap 11/01/2022 13    Magnesium 11/01/2022 1.3 (L)    Potassium 11/01/2022 2.7 (LL)    Magnesium 11/01/2022 2.4    Potassium 11/01/2022 3.6    Sodium 11/02/2022 138    Potassium 11/02/2022 3.1 (L)    Chloride 11/02/2022 104    CO2 11/02/2022 25    Glucose, Bld 11/02/2022 195 (H)    BUN 11/02/2022 9    Creatinine, Ser 11/02/2022 1.61 (H)    Calcium 11/02/2022 7.9 (L)    Total Protein 11/02/2022 5.7 (L)    Albumin 11/02/2022 2.3 (L)    AST 11/02/2022 84 (H)    ALT 11/02/2022 37    Alkaline Phosphatase 11/02/2022 117    Total Bilirubin 11/02/2022 0.3    GFR, Estimated 11/02/2022 44 (L)    Anion gap 11/02/2022 9   Office Visit on 05/03/2022  Component Date Value    Amphetamines, Urine 05/03/2022 Negative    Cannabinoid Quant, Ur 05/03/2022 Negative    Cocaine (Metab.) 05/03/2022 Negative    OPIATE QUANTITATIVE URINE 05/03/2022 Negative    PCP Quant, Ur 05/03/2022 Negative    WBC 05/03/2022 7.9    RBC 05/03/2022 4.27    Hemoglobin 05/03/2022 13.5    HCT 05/03/2022 39.9    MCV 05/03/2022 93.6    MCHC 05/03/2022 33.8    RDW 05/03/2022 13.9    Platelets 05/03/2022 223.0    Neutrophils Relative % 05/03/2022 58.4    Lymphocytes Relative 05/03/2022 28.0    Monocytes Relative 05/03/2022 8.8    Eosinophils Relative 05/03/2022 4.3    Basophils Relative 05/03/2022 0.5    Neutro Abs 05/03/2022 4.6    Lymphs Abs 05/03/2022 2.2  Monocytes Absolute 05/03/2022 0.7    Eosinophils Absolute 05/03/2022 0.3    Basophils Absolute 05/03/2022 0.0    Sodium 05/03/2022 138    Potassium 05/03/2022 3.5    Chloride 05/03/2022 101    CO2 05/03/2022 32    Glucose, Bld 05/03/2022 110 (H)    BUN 05/03/2022 14    Creatinine, Ser 05/03/2022 1.06    Total Bilirubin 05/03/2022 0.4    Alkaline Phosphatase 05/03/2022 83    AST 05/03/2022 48 (H)    ALT 05/03/2022 20    Total Protein 05/03/2022 6.5    Albumin 05/03/2022 3.7    GFR 05/03/2022 67.73    Calcium 05/03/2022 8.6    Cholesterol 05/03/2022 93    Triglycerides 05/03/2022 119.0    HDL 05/03/2022 56.30    VLDL 05/03/2022 23.8    LDL Cholesterol 05/03/2022 13    Total CHOL/HDL Ratio 05/03/2022 2    NonHDL 05/03/2022 36.54    Hgb A1c MFr Bld 05/03/2022 6.4    Microalb, Ur 05/03/2022 5.4 (H)    Creatinine,U 05/03/2022 33.0    Microalb Creat Ratio 05/03/2022 16.2    TSH 05/03/2022 1.88    HIV 1&2 Ab, 4th Generati* 05/03/2022 NON-REACTIVE    Sed Rate 05/03/2022 16    CRP 05/03/2022 <1.0   No image results found. MR KNEE RIGHT W WO CONTRAST Result Date: 01/17/2023 CLINICAL DATA:  Knee swelling, possible infection EXAM: MRI OF THE RIGHT KNEE WITHOUT AND WITH CONTRAST TECHNIQUE: Multiplanar, multisequence MR  imaging of the knee was performed before and after the administration of intravenous contrast. CONTRAST:  6mL GADAVIST GADOBUTROL 1 MMOL/ML IV SOLN COMPARISON:  11/07/2022 FINDINGS: MENISCI Medial meniscus: Worsening of the prior tear, now with a large radial component in the posterior horn adjacent to the meniscal root. Lateral meniscus:  Unremarkable LIGAMENTS Cruciates:  Unremarkable Collaterals:  Unremarkable CARTILAGE Patellofemoral: Severe chondral thinning and chondral irregularity laterally in the patellofemoral joint with mild associated subcortical edema. Marginal spurring. Medial:  Moderate to severe degenerative chondral thinning. Lateral:  Moderate degenerative chondral thinning. Joint: Moderate to large knee joint effusion. Mildly worsened synovitis compared to previous, currently moderate. Thickened medial plica. 0.8 cm curvilinear suspected free osteochondral fragment just posterior to the lateral portion of the medial femoral condyle on image 23 series 4. Popliteal Fossa:  Trace infiltrative edema. Extensor Mechanism:  Unremarkable Bones: Worsening of the subcortical stress fracture or osteochondral lesion anteromedially along the medial femoral condyle with worsened adjacent marrow edema, image 14 series 9. New subcortical stress fracture or osteochondral lesion along the medial tibial plateau with surrounding marrow edema, image d 15 series 9. Progressive subcortical marrow edema laterally in the patellofemoral joint. Other: No supplemental non-categorized findings. IMPRESSION: 1. Worsening of the prior tear in the posterior horn of the medial meniscus, now with a large radial component in the posterior horn adjacent to the meniscal root. 2. Worsening of the subcortical stress fracture or osteochondral lesion anteromedially along the medial femoral condyle with worsened adjacent marrow edema. New subcortical stress fracture or osteochondral lesion along the medial tibial plateau with surrounding  marrow edema. 3. Moderate to large knee joint effusion with mildly worsened synovitis compared to previous, currently moderate. Given the worsening I cannot confidently exclude the possibility of early septic joint, and arthrocentesis may be warranted if there is clinical suspicion. 4. 0.8 cm curvilinear suspected free osteochondral fragment just posterior to the lateral portion of the medial femoral condyle. 5. Moderate to severe tricompartmental osteoarthritis. Electronically Signed   By:  Gaylyn Rong M.D.   On: 01/17/2023 08:46   [place] EKG SITE RITE Result Date: 11/13/2022 If Site Rite image not attached, placement could not be confirmed due to current cardiac rhythm.  [place] EKG SITE RITE Result Date: 11/11/2022 If Site Rite image not attached, placement could not be confirmed due to current cardiac rhythm.  MR FINGERS RIGHT W WO CONTRAST Result Date: 11/10/2022 CLINICAL DATA:  Pain and swelling in the right index finger. Bacteremia. EXAM: MRI OF THE RIGHT FINGERS WITHOUT AND WITH CONTRAST TECHNIQUE: Multiplanar, multisequence MR imaging of the fingers of the right hand was performed before and after the administration of intravenous contrast. CONTRAST:  6mL GADAVIST GADOBUTROL 1 MMOL/ML IV SOLN COMPARISON:  Radiographs 11/07/2022 FINDINGS: Bones/Joint/Cartilage The examination concentrates on the index finger, although the other fingers are included on some of the sequences. There are multifocal osteoarthritic changes involving the interphalangeal joints, most advanced at the 2nd DIP joint. There is also arthropathy at the 2nd and 3rd metacarpal phalangeal joints. At the 2nd MCP joint, there are prominent subchondral cysts, marrow edema and enhancement within the metacarpal head and proximal phalanx. There is increased signal within the distal 2nd phalanx on the T2 weighted and postcontrast images which may be secondary to incomplete fat saturation. No suspicious T1 signal abnormality identified in  these areas. There are no large joint effusions. Ligaments The collateral ligaments of the visualized metacarpal phalangeal joints are intact. Muscles and Tendons The flexor and extensor tendons of the visualized fingers are intact. No significant tenosynovitis. Nonspecific edema throughout the musculature within the 1st web space without focal fluid collection or abnormal enhancement. Soft tissues Subcutaneous edema surrounding the 2nd metacarpal phalangeal joint without focal fluid collection or apparent overlying skin ulceration. IMPRESSION: 1. Multifocal osteoarthritic changes throughout the visualized right hand, most advanced at the 2nd MCP joint. 2. Nonspecific marrow edema and enhancement around the 2nd metacarpal phalangeal joint with surrounding soft tissue swelling, suspicious for inflammatory or crystalline arthropathy. This could reflect superimposed septic arthritis and early osteomyelitis. 3. No evidence of soft tissue abscess. Electronically Signed   By: Carey Bullocks M.D.   On: 11/10/2022 11:18   MR CERVICAL SPINE W WO CONTRAST Result Date: 11/10/2022 CLINICAL DATA:  Initial evaluation for neck pain, infection suspected. EXAM: MRI CERVICAL SPINE WITHOUT AND WITH CONTRAST TECHNIQUE: Multiplanar and multiecho pulse sequences of the cervical spine, to include the craniocervical junction and cervicothoracic junction, were obtained without and with intravenous contrast. CONTRAST:  6mL GADAVIST GADOBUTROL 1 MMOL/ML IV SOLN COMPARISON:  Prior CT from 04/24/2009. FINDINGS: Alignment: Straightening of the normal cervical lordosis. 2-3 mm facet mediated anterolisthesis of C4 on C5, C6 on C7, and C7 on T1. Vertebrae: Vertebral body height maintained without acute or chronic fracture. Susceptibility artifact related to prior posterior fusion at C2-3. Bone marrow signal intensity within normal limits. No worrisome osseous lesions. Mild reactive endplate changes with marrow edema present about the C5-6  interspace, greater on the right. Trace prevertebral edema seen about the cervical spine (series 7, image 8). While these findings could be degenerative in nature. The presence of the prevertebral edema raises the possibility for early and/or developing osteomyelitis discitis at C5-6. Minimal reactive marrow edema about the left C4-5 facet felt to be due to benign/sterile facet arthritis. No other evidence for acute infection elsewhere within the cervical spine. Cord: Normal signal and morphology. No epidural collections. No abnormal enhancement. Posterior Fossa, vertebral arteries, paraspinal tissues: Visualized brain and posterior fossa within normal limits. Craniocervical  junction normal. Trace prevertebral edema again noted. Paraspinous soft tissues demonstrate no other acute finding. Normal flow voids seen within the vertebral arteries bilaterally. Flattening of the ventral thecal sac. Superimposed right greater than left facet hypertrophy. Moderate spinal stenosis. Severe right worse than left C6 foraminal narrowing. Disc levels: C2-C3: Negative interspace. Prior posterior fusion. No canal or foraminal stenosis. C3-C4: Mild disc bulge with right-sided uncovertebral spurring. Mild facet hypertrophy. No significant spinal stenosis. Moderate right C4 foraminal narrowing. Left neural foramen remains patent. C4-C5: Degenerative intervertebral disc space narrowing with diffuse disc bulge and bilateral uncovertebral spurring. Mild bilateral facet and ligament flavum hypertrophy. Moderate spinal stenosis. Severe left with moderate right C5 foraminal narrowing. C5-C6: Degenerative intervertebral disc space narrowing with circumferential disc osteophyte complex, slightly asymmetric to the right. Flattening of the ventral thecal sac. Superimposed right greater than left facet hypertrophy. Moderate spinal stenosis. Severe right worse than left C6 foraminal narrowing. C6-C7: Mild disc bulge with uncovertebral spurring.  Superimposed small right paracentral to foraminal disc protrusion (series 8, image 29). Moderate left-sided facet arthrosis. No significant spinal stenosis. Foramina remain patent. C7-T1: Anterolisthesis. Mild disc bulge. Moderate bilateral facet arthrosis. No spinal stenosis. Foramina remain patent. IMPRESSION: 1. Mild reactive endplate changes with marrow edema about the C5-6 interspace, with trace prevertebral edema. While these findings could be degenerative in nature, the presence of the prevertebral edema raises the possibility for early and/or developing infection at C5-6. Correlation with laboratory values and symptomatology recommended. Additionally, a short interval follow-up MRI to evaluate for potential evolutionary changes may be helpful for further evaluation as warranted. 2. No other evidence for acute infection elsewhere within the cervical spine. 3. Multilevel cervical spondylosis with resultant moderate spinal stenosis at C4-5 and C5-6. Moderate to severe bilateral C4 through C6 foraminal narrowing as above. Electronically Signed   By: Rise Mu M.D.   On: 11/10/2022 06:06   DG Finger Index Right Result Date: 11/07/2022 CLINICAL DATA:  Osteomyelitis.  Pain. EXAM: RIGHT INDEX FINGER 2+V COMPARISON:  None Available. FINDINGS: Arthropathy of the distal interphalangeal joint with joint space narrowing and spurring. There may be a small erosion involving the radial aspect of the joint space. No frank bony destruction. The distal phalanx is dorsally subluxed, no frank dislocation. Joint space narrowing with subchondral cysts at the metacarpal phalangeal joint. Mild generalized soft tissue edema. No soft tissue gas or radiopaque foreign body. IMPRESSION: 1. Arthropathy of the distal interphalangeal joint with dorsal subluxation of the distal phalanx. There may be a small erosion involving the radial aspect of the joint space. This could represent infection in the appropriate clinical  setting. No frank bony destruction. 2. Mild generalized soft tissue edema. Electronically Signed   By: Narda Rutherford M.D.   On: 11/07/2022 19:41   MR WRIST LEFT W WO CONTRAST Result Date: 11/07/2022 CLINICAL DATA:  Bacteremia.  Swelling and tenderness of the wrist EXAM: MR OF THE LEFT WRIST WITHOUT AND WITH CONTRAST TECHNIQUE: Multiplanar multisequence MR imaging of the left wrist was performed both before and after the administration of intravenous contrast. CONTRAST:  6mL GADAVIST GADOBUTROL 1 MMOL/ML IV SOLN COMPARISON:  None Available. FINDINGS: Despite efforts by the technologist and patient, motion artifact is present on today's exam and could not be eliminated. This reduces exam sensitivity and specificity. Ligaments: Grossly intact appearing intrinsic interosseous ligaments. No obvious the dorsal or volar ligamentous insufficiency. Triangular fibrocartilage: Grossly intact although partially obscured by field heterogeneity. Tendons: Unremarkable Carpal tunnel/median nerve: Unremarkable Guyon's canal: No impinging lesion  identified. Joint/cartilage: Radiocarpal and midcarpal joint effusions. Bones/carpal alignment: Small degenerative subacute S cortical cystic lesions along the lunate side of the scaphoid (image 9, series 8) and along the proximal pole of the capitate. Advanced degenerative arthropathy of the first carpal metacarpal articulation with prominent subcortical cyst formation and spurring. Mild degenerative arthropathy between the scaphoid and trapezium. Small effusion of the distal radioulnar joint with associated degenerative arthropathy as on image 20 series 9. Other: Dorsal subcutaneous edema along the wrist and extending into the hand. No abscess observed. IMPRESSION: 1. Dorsal subcutaneous edema along the wrist and extending into the hand, without abscess. 2. Radiocarpal and midcarpal joint effusions. Effusion of the distal radioulnar joint. 3. Advanced degenerative arthropathy of the  first carpometacarpal articulation. Mild degenerative arthropathy between the scaphoid and trapezium. Electronically Signed   By: Gaylyn Rong M.D.   On: 11/07/2022 19:02   MR KNEE RIGHT W WO CONTRAST Result Date: 11/07/2022 CLINICAL DATA:  Bacteremia and knee tenderness EXAM: MRI OF THE RIGHT KNEE WITHOUT AND WITH CONTRAST TECHNIQUE: Multiplanar, multisequence MR imaging of the knee was performed before and after the administration of intravenous contrast. CONTRAST:  6mL GADAVIST GADOBUTROL 1 MMOL/ML IV SOLN COMPARISON:  None Available. FINDINGS: Despite efforts by the technologist and patient, motion artifact is present on today's exam and could not be eliminated. This reduces exam sensitivity and specificity. MENISCI Medial meniscus: Degenerative tearing of the posterior horn likely extending into the meniscal root. Mild grade 3 signal in the posterior horn extends to the inferior surface as on image 28 series 7. Lateral meniscus: Prominent expansion of the posterior horn extending towards the meniscal root suggesting prominent degeneration. LIGAMENTS Cruciates:  Unremarkable Collaterals:  Unremarkable CARTILAGE Patellofemoral: Moderate to severe chondral thinning with marginal spurring. Medial:  Mild chondral thinning. Lateral:  Mild chondral thinning. Joint: Moderate to large knee effusion. Only thin synovial enhancement. This is unlikely to be a septic joint given the thin nature of the synovial enhancement. Thin medial plica noted. Popliteal Fossa:  Unremarkable Extensor Mechanism: Possible focal central tendinopathy in the distal patellar tendon on image 13 series 7. Bones: 1.0 by 1.4 cm non-fragmented osteochondral lesion versus subcortical stress fracture anteriorly in the medial femoral condyle with surrounding marrow edema as on image 13 series 11 Other: No supplemental non-categorized findings. IMPRESSION: 1. Moderate to large knee effusion, but with only thin synovial enhancement. This is  unlikely to be a septic joint given the thin nature of the synovial enhancement. 2. Non-fragmented osteochondral lesion versus subcortical stress fracture anteriorly in the medial femoral condyle with surrounding marrow edema. 3. Degenerative tearing of the posterior horn medial meniscus likely extending into the meniscal root. 4. Prominent expansion of the posterior horn lateral meniscus extending towards the meniscal root suggesting prominent degeneration. 5. Moderate to severe chondral thinning in the patellofemoral compartment with mild chondral thinning in the medial and lateral compartments. 6. Possible focal central tendinopathy in the distal patellar tendon. Electronically Signed   By: Gaylyn Rong M.D.   On: 11/07/2022 18:54   ECHOCARDIOGRAM COMPLETE Result Date: 11/07/2022    ECHOCARDIOGRAM REPORT   Patient Name:   KAVIOUS SCHROM Date of Exam: 11/07/2022 Medical Rec #:  [id number]        Height:       71.0 in Accession #:    [id number]       Weight:       134.9 lb Date of Birth:  [birth year]       BSA:  1.784 m Patient Age:    76 years         BP:           139/76 mmHg Patient Gender: M                HR:           115 bpm. Exam Location:  Inpatient Procedure: 2D Echo, Cardiac Doppler and Color Doppler Indications:    Syncope R55  History:        Patient has no prior history of Echocardiogram examinations.                 COPD; Risk Factors:Hypertension and Dyslipidemia.  Sonographer:    Harriette Bouillon RDCS Referring Phys: [id number] CAROLE N HALL IMPRESSIONS  1. Left ventricular ejection fraction, by estimation, is 60 to 65%. The left ventricle has normal function. The left ventricle has no regional wall motion abnormalities. Left ventricular diastolic parameters are consistent with Grade II diastolic dysfunction (pseudonormalization).  2. Right ventricular systolic function is normal. The right ventricular size is normal. There is normal pulmonary artery systolic pressure.  3. The mitral  valve is normal in structure. No evidence of mitral valve regurgitation. No evidence of mitral stenosis.  4. The aortic valve is tricuspid. Aortic valve regurgitation is not visualized. No aortic stenosis is present.  5. The inferior vena cava is normal in size with greater than 50% respiratory variability, suggesting right atrial pressure of 3 mmHg. FINDINGS  Left Ventricle: Left ventricular ejection fraction, by estimation, is 60 to 65%. The left ventricle has normal function. The left ventricle has no regional wall motion abnormalities. The left ventricular internal cavity size was normal in size. There is  no left ventricular hypertrophy. Left ventricular diastolic parameters are consistent with Grade II diastolic dysfunction (pseudonormalization). Right Ventricle: The right ventricular size is normal. No increase in right ventricular wall thickness. Right ventricular systolic function is normal. There is normal pulmonary artery systolic pressure. The tricuspid regurgitant velocity is 2.00 m/s, and  with an assumed right atrial pressure of 3 mmHg, the estimated right ventricular systolic pressure is 19.0 mmHg. Left Atrium: Left atrial size was normal in size. Right Atrium: Right atrial size was normal in size. Pericardium: There is no evidence of pericardial effusion. Mitral Valve: The mitral valve is normal in structure. No evidence of mitral valve regurgitation. No evidence of mitral valve stenosis. Tricuspid Valve: The tricuspid valve is normal in structure. Tricuspid valve regurgitation is trivial. No evidence of tricuspid stenosis. Aortic Valve: The aortic valve is tricuspid. Aortic valve regurgitation is not visualized. No aortic stenosis is present. Pulmonic Valve: The pulmonic valve was normal in structure. Pulmonic valve regurgitation is not visualized. No evidence of pulmonic stenosis. Aorta: The aortic root is normal in size and structure. Venous: The inferior vena cava is normal in size with greater  than 50% respiratory variability, suggesting right atrial pressure of 3 mmHg. IAS/Shunts: No atrial level shunt detected by color flow Doppler.  LEFT VENTRICLE PLAX 2D LVIDd:         5.20 cm LVIDs:         4.00 cm LV PW:         1.10 cm LV IVS:        1.10 cm LVOT diam:     2.20 cm LV SV:         55 LV SV Index:   31 LVOT Area:     3.80 cm  IVC IVC  diam: 1.20 cm LEFT ATRIUM         Index LA diam:    3.70 cm 2.07 cm/m  AORTIC VALVE LVOT Vmax:   97.90 cm/s LVOT Vmean:  70.600 cm/s LVOT VTI:    0.145 m  AORTA Ao Root diam: 3.10 cm Ao Asc diam:  3.40 cm MITRAL VALVE               TRICUSPID VALVE MV Area (PHT): 5.06 cm    TR Peak grad:   16.0 mmHg MV Decel Time: 150 msec    TR Vmax:        200.00 cm/s MV E velocity: 85.10 cm/s MV A velocity: 46.50 cm/s  SHUNTS MV E/A ratio:  1.83        Systemic VTI:  0.14 m                            Systemic Diam: 2.20 cm Chilton Si MD Electronically signed by Chilton Si MD Signature Date/Time: 11/07/2022/11:51:26 AM    Final    US RENAL Result Date: 11/06/2022 CLINICAL DATA:  Acute kidney insufficiency EXAM: RENAL / URINARY TRACT ULTRASOUND COMPLETE COMPARISON:  CT 10/27/2022 FINDINGS: Right Kidney: Renal measurements: 11.4 x 6.7 x 5.5 cm = volume: 220.2 mL. No collecting system dilatation. Anechoic structure seen consistent with a cyst towards the lower pole measuring 2.1 cm. Left Kidney: Renal measurements: 10.5 x 6.5 x 5.6 cm = volume: 201.4 mL. Multiple cysts are identified which appears simple as seen on CT. Example measures up to 6 cm. No collecting system dilatation. Bladder: Bladder is distended. The bladder lumen has layering and floating debris. Other: None. IMPRESSION: No collecting system dilatation. Bilateral simple appearing renal cysts by ultrasound. Please correlate with the prior CT. Distended bladder with luminal floating and layering debris. Please correlate with clinical findings. Electronically Signed   By: Karen Kays M.D.   On: 11/06/2022 15:04    CT Head Wo Contrast Result Date: 11/05/2022 CLINICAL DATA:  Altered mental status EXAM: CT HEAD WITHOUT CONTRAST TECHNIQUE: Contiguous axial images were obtained from the base of the skull through the vertex without intravenous contrast. RADIATION DOSE REDUCTION: This exam was performed according to the departmental dose-optimization program which includes automated exposure control, adjustment of the mA and/or kV according to patient size and/or use of iterative reconstruction technique. COMPARISON:  10/28/2022 FINDINGS: Brain: There is no mass, hemorrhage or extra-axial collection. There is generalized atrophy without lobar predilection. Hypodensity of the white matter is most commonly associated with chronic microvascular disease. Old left basal ganglia small vessel infarct. Vascular: Atherosclerotic calcification of the internal carotid arteries at the skull base. No abnormal hyperdensity of the major intracranial arteries or dural venous sinuses. Skull: The visualized skull base, calvarium and extracranial soft tissues are normal. Sinuses/Orbits: Opacification of the right sphenoid sinus with chronic osseous thickening. The other paranasal sinuses are clear. No mastoid or middle ear effusion. Normal orbits. IMPRESSION: 1. No acute intracranial abnormality. 2. Generalized atrophy and findings of chronic microvascular disease. 3. Old left basal ganglia small vessel infarct. 4. Chronic right sphenoid sinusitis. Electronically Signed   By: Deatra Robinson M.D.   On: 11/05/2022 21:36   DG Chest Portable 1 View Result Date: 11/05/2022 CLINICAL DATA:  Altered mental status and multiple syncopal episodes with generalized weakness. EXAM: PORTABLE CHEST 1 VIEW COMPARISON:  PA Lat chest 10/27/2022 FINDINGS: There is a tangle of overlying monitor wiring. The cardiomediastinal silhouette  and vascular pattern are normal. The mediastinum is stable with moderate aortic calcific plaques. The lungs are mildly emphysematous  but clear. Thoracic cage is intact. IMPRESSION: No evidence of acute chest disease. Stable COPD chest with aortic atherosclerosis. Electronically Signed   By: Almira Bar M.D.   On: 11/05/2022 21:08   CT HEAD WO CONTRAST ( ) Result Date: 10/28/2022 CLINICAL DATA:  77 year old male with falls, head injury. EXAM: CT HEAD WITHOUT CONTRAST TECHNIQUE: Contiguous axial images were obtained from the base of the skull through the vertex without intravenous contrast. RADIATION DOSE REDUCTION: This exam was performed according to the departmental dose-optimization program which includes automated exposure control, adjustment of the mA and/or kV according to patient size and/or use of iterative reconstruction technique. COMPARISON:  Brain MRI 02/16/2019. FINDINGS: Brain: Cerebral volume appears stable since 2021. Chronic lacunar infarct of the left thalamus was present at that time, but a larger chronic appearing lacunar infarct of the left corona radiata and lentiform is new since that time. Patchy white matter hypodensity elsewhere not significantly changed. Posterior fossa remains within normal limits. No midline shift, ventriculomegaly, mass effect, evidence of mass lesion, intracranial hemorrhage or evidence of cortically based acute infarction. Vascular: Calcified atherosclerosis at the skull base. No suspicious intracranial vascular hyperdensity. Skull: No acute osseous abnormality identified. Sinuses/Orbits: Chronic paranasal sinus disease, especially bulky sphenoid mucoperiosteal thickening. But other paranasal sinuses, tympanic cavities and mastoids remain well aerated. Other: No acute orbit or scalp soft tissue injury identified. Postoperative changes to the globes. IMPRESSION: 1. No acute intracranial abnormality or acute traumatic injury identified. 2. Progressed chronic small vessel disease since 2021 in the left corona radiata, left basal ganglia. Electronically Signed   By: Odessa Fleming M.D.   On: 10/28/2022  05:45   DG Chest 2 View Result Date: 10/27/2022 CLINICAL DATA:  Progressive weakness EXAM: CHEST - 2 VIEW COMPARISON:  07/17/2019 FINDINGS: The heart size and mediastinal contours are within normal limits. Aortic atherosclerosis. Both lungs are clear. The visualized skeletal structures are unremarkable. IMPRESSION: No active cardiopulmonary disease. Electronically Signed   By: Jasmine Pang M.D.   On: 10/27/2022 22:53   CT ABDOMEN PELVIS WO CONTRAST Result Date: 10/27/2022 CLINICAL DATA:  Abdominal pain EXAM: CT ABDOMEN AND PELVIS WITHOUT CONTRAST TECHNIQUE: Multidetector CT imaging of the abdomen and pelvis was performed following the standard protocol without IV contrast. RADIATION DOSE REDUCTION: This exam was performed according to the departmental dose-optimization program which includes automated exposure control, adjustment of the mA and/or kV according to patient size and/or use of iterative reconstruction technique. COMPARISON:  CT 07/17/2019 FINDINGS: Lower chest: Lung bases demonstrate no acute airspace disease. Mild coronary vascular calcification. Hepatobiliary: No calcified gallstone or biliary dilatation. Hepatic cyst in the left hepatic dome. Pancreas: Markedly atrophic. Scattered calcifications presumably due to chronic pancreatitis. No acute inflammation Spleen: Normal in size without focal abnormality. Adrenals/Urinary Tract: Adrenal glands are normal. Prominent renal collecting systems without obstructing stone. Distended urinary bladder. Renal cysts. No imaging follow-up is recommended Stomach/Bowel: The stomach is nonenlarged. There is no dilated small bowel. Extensive stool throughout the colon with hyperdense material. Postsurgical changes at the rectum Vascular/Lymphatic: Moderate aortic atherosclerosis. No aneurysm. No suspicious lymph nodes Reproductive: Prostate unremarkable Other: Negative for pelvic effusion or free air. Musculoskeletal: No acute or suspicious osseous abnormality.  Posterior spinal fusion hardware at L4-L5. IMPRESSION: 1. Negative for bowel obstruction or acute inflammatory process. 2. Extensive stool throughout the colon with hyperdense material, question constipation. 3. Distended urinary bladder with  prominent renal collecting systems. 4. Atrophic pancreas with scattered calcifications presumably due to chronic pancreatitis. 5. Aortic atherosclerosis. Aortic Atherosclerosis (ICD10-I70.0). Electronically Signed   By: Jasmine Pang M.D.   On: 10/27/2022 22:51  MR KNEE RIGHT W WO CONTRAST Result Date: 01/17/2023 CLINICAL DATA:  Knee swelling, possible infection EXAM: MRI OF THE RIGHT KNEE WITHOUT AND WITH CONTRAST TECHNIQUE: Multiplanar, multisequence MR imaging of the knee was performed before and after the administration of intravenous contrast. CONTRAST:  6mL GADAVIST GADOBUTROL 1 MMOL/ML IV SOLN COMPARISON:  11/07/2022 FINDINGS: MENISCI Medial meniscus: Worsening of the prior tear, now with a large radial component in the posterior horn adjacent to the meniscal root. Lateral meniscus:  Unremarkable LIGAMENTS Cruciates:  Unremarkable Collaterals:  Unremarkable CARTILAGE Patellofemoral: Severe chondral thinning and chondral irregularity laterally in the patellofemoral joint with mild associated subcortical edema. Marginal spurring. Medial:  Moderate to severe degenerative chondral thinning. Lateral:  Moderate degenerative chondral thinning. Joint: Moderate to large knee joint effusion. Mildly worsened synovitis compared to previous, currently moderate. Thickened medial plica. 0.8 cm curvilinear suspected free osteochondral fragment just posterior to the lateral portion of the medial femoral condyle on image 23 series 4. Popliteal Fossa:  Trace infiltrative edema. Extensor Mechanism:  Unremarkable Bones: Worsening of the subcortical stress fracture or osteochondral lesion anteromedially along the medial femoral condyle with worsened adjacent marrow edema, image 14 series  9. New subcortical stress fracture or osteochondral lesion along the medial tibial plateau with surrounding marrow edema, image d 15 series 9. Progressive subcortical marrow edema laterally in the patellofemoral joint. Other: No supplemental non-categorized findings. IMPRESSION: 1. Worsening of the prior tear in the posterior horn of the medial meniscus, now with a large radial component in the posterior horn adjacent to the meniscal root. 2. Worsening of the subcortical stress fracture or osteochondral lesion anteromedially along the medial femoral condyle with worsened adjacent marrow edema. New subcortical stress fracture or osteochondral lesion along the medial tibial plateau with surrounding marrow edema. 3. Moderate to large knee joint effusion with mildly worsened synovitis compared to previous, currently moderate. Given the worsening I cannot confidently exclude the possibility of early septic joint, and arthrocentesis may be warranted if there is clinical suspicion. 4. 0.8 cm curvilinear suspected free osteochondral fragment just posterior to the lateral portion of the medial femoral condyle. 5. Moderate to severe tricompartmental osteoarthritis. Electronically Signed   By: Gaylyn Rong M.D.   On: 01/17/2023 08:46        This document was synthesized by artificial intelligence ([employer]) using HIPAA-compliant recording of the clinical interaction;   We discussed the use of AI scribe software for clinical note transcription with the patient, who gave verbal consent to proceed.    Additional Info: This encounter employed state-of-the-art, real-time, collaborative documentation. The patient actively reviewed and assisted in updating their electronic medical record on a shared screen, ensuring transparency and facilitating joint problem-solving for the problem list, overview, and plan. This approach promotes accurate, informed care. The treatment plan was discussed and reviewed in detail, including  medication safety, potential side effects, and all patient questions. We confirmed understanding and comfort with the plan. Follow-up instructions were established, including contacting the office for any concerns, returning if symptoms worsen, persist, or new symptoms develop, and precautions for potential emergency department visits.

## 2023-01-19 NOTE — Patient Instructions (Signed)
VISIT SUMMARY:  During today's visit, we discussed your ongoing issues with a persistently swollen right knee, recent memory loss, and pain management. We reviewed your history of septic arthritis and recent infections, and we have outlined a plan to address these concerns.  YOUR PLAN:  -SEPTIC ARTHRITIS: Septic arthritis is a joint infection that can cause severe pain and swelling. Your right knee remains swollen and painful despite previous antibiotic treatment. We are urgently referring you to orthopedic specialist Dr. Lenda Kelp for further evaluation and potential surgical intervention. Please continue your current antibiotics and monitor for any worsening symptoms. If there is no improvement within a few days, consider visiting the ER.  -MEMORY LOSS: Memory loss can be caused by various factors, including infections or neurological issues. Given your recent infections, we need to rule out any serious conditions. We will order an urgent MRI of your brain to check for any abnormalities or infections.  -PAIN MANAGEMENT: Effective pain management is crucial for your quality of life. With your previous pain management provider unavailable, we have prescribed oxycodone 10 mg three times a day. We will refer you to a new pain management clinic and follow up monthly until you are established with the new provider.  INSTRUCTIONS:  Please follow up with Dr. Wyonia Hough on January 22. If your symptoms worsen or do not improve before your orthopedic appointment, consider visiting the ER.

## 2023-01-19 NOTE — Assessment & Plan Note (Addendum)
Agree with concern(s) of infectious disease specialist Dr. Daiva Eves that septic arthritis may not be resolving despite treatment of mssa found on culture- further I suspect undetected alternative organism stemming from a deep fish hook injury through left hand that was treated at  the time 06/2022 with antibiotics.  The differential diagnosis includes rare or resistant bacterial infection or even nonbacterial infections. We discussed the risks of untreated septic arthritis, such as joint damage and systemic infection. An urgent orthopedic evaluation and potential surgical intervention are recommended to evaluate for a sooner than planned re-aspiration and special cultures.  We will try to assist by placing an urgent referral to the renowned orthopedic knee specialist Dr. Lequita Halt urgently, continue current antibiotics, and monitor for worsening symptoms, considering an ER visit if there is no improvement within a few days and/or we are unable to get urgent outpatient follow up.  Sharing this encounter with Dr. Daiva Eves.  Agree that wrist infection seems to have simmered down if not totally resolved.  but knee is hurting a lot and swelling more since leaving hospital making me concerned for recurrent vs undetected active infection.    However labwork recently  did show ESR coming down, hemoglobin coming up,  creat stable Photographs Taken 01/19/2023 :not obvious in this photo but R knee is much larger.    MRI  2 days ago of the knee:  IMPRESSION: 1. Worsening of the prior tear in the posterior horn of the medial meniscus, now with a large radial component in the posterior horn adjacent to the meniscal root. 2. Worsening of the subcortical stress fracture or osteochondral lesion anteromedially along the medial femoral condyle with worsened adjacent marrow edema. New subcortical stress fracture or osteochondral lesion along the medial tibial plateau with surrounding marrow edema. 3. Moderate to large knee joint  effusion with mildly worsened synovitis compared to previous, currently moderate. Given the worsening I cannot confidently exclude the possibility of early septic joint, and arthrocentesis may be warranted if there is clinical suspicion. 4. 0.8 cm curvilinear suspected free osteochondral fragment just posterior to the lateral portion of the medial femoral condyle. 5. Moderate to severe tricompartmental osteoarthritis.

## 2023-01-19 NOTE — Assessment & Plan Note (Signed)
Pain Management With their previous pain management provider unavailable and currently on a reduced dose of oxycodone (10 mg three times a day), we discussed the risks of opioid use and the benefits of pain control. There is a need for a new pain management provider and interim monthly follow-ups. We will prescribe oxycodone 10 mg three times a day, 90 tabs, refer them to a new pain management clinic, and follow up monthly for pain management until a new clinic is established.

## 2023-01-23 ENCOUNTER — Encounter: Payer: Self-pay | Admitting: Internal Medicine

## 2023-01-30 ENCOUNTER — Ambulatory Visit
Admission: RE | Admit: 2023-01-30 | Discharge: 2023-01-30 | Disposition: A | Discharge status: Home / Self Care | Payer: Medicare HMO | Source: Ambulatory Visit | Attending: Internal Medicine | Admitting: Internal Medicine

## 2023-01-30 DIAGNOSIS — G9389 Other specified disorders of brain: Secondary | ICD-10-CM | POA: Diagnosis not present

## 2023-01-30 DIAGNOSIS — G8929 Other chronic pain: Secondary | ICD-10-CM

## 2023-01-30 DIAGNOSIS — R413 Other amnesia: Secondary | ICD-10-CM | POA: Diagnosis not present

## 2023-01-30 DIAGNOSIS — M25461 Effusion, right knee: Secondary | ICD-10-CM

## 2023-01-30 MED ORDER — GADOPICLENOL 0.5 MMOL/ML IV SOLN
9.0000 mL | Freq: Once | INTRAVENOUS | Status: AC | PRN
  Administered 2023-01-30: 9 mL via INTRAVENOUS

## 2023-02-03 ENCOUNTER — Encounter: Payer: Self-pay | Admitting: Internal Medicine

## 2023-02-06 ENCOUNTER — Telehealth: Payer: Self-pay

## 2023-02-06 NOTE — Telephone Encounter (Signed)
-----   Message from Jomarie Salinas Dam sent at 02/03/2023  7:31 PM EST ----- I sent you a note as well. 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  That being said I do not like the idea of stopping an antibiotic if it is possible that suppressing an infection that has a high risk of becoming blood-borne again  4 I called the son who manages his pills back up again and asked him to keep the patient on antibiotics  With further thought I recommend that the patient be seen by one of my partners or nurse practitioners in the clinic next week.  It may be prudent to have him directly admitted to the hospital for consideration of transesophageal echocardiogram and to expedite orthopedic surgical evaluation ----- Message ----- From: [name], MD Sent: 02/03/2023   4:03 PM EST To: Jomarie LOISE Salinas Kathie, MD

## 2023-02-06 NOTE — Telephone Encounter (Signed)
 Spoke with patient's son, [name] (HA[place]II), and reminded him that Dr. Fleeta Rothman would like Atlas to remain on antibiotics. They will come in to see Dr. Overton 1/8 to discuss possible direct admit. Barton has an appointment with Ortho, Dr. Heide tomorrow.   Bart Ashford D Marjon Doxtater, RN

## 2023-02-07 ENCOUNTER — Telehealth: Payer: Self-pay | Admitting: *Deleted

## 2023-02-07 DIAGNOSIS — M25561 Pain in right knee: Secondary | ICD-10-CM | POA: Diagnosis not present

## 2023-02-07 NOTE — Telephone Encounter (Signed)
 Called patient back concerning this.

## 2023-02-07 NOTE — Telephone Encounter (Signed)
 Copied from CRM [id number]. Topic: General - Call Back - No Documentation >> Feb 07, 2023 11:09 AM Elizebeth Brooking wrote: Reason for CRM: Patient son called in regarding missed call from tiffany, is asking for a callback to discuss father

## 2023-02-07 NOTE — Telephone Encounter (Signed)
 Called patient's son and informed him of all information below. He informed me that patient is scheduled to see Dr. Overton tomorrow (1/8), which is a infectious disease doctor from the same office. Also, he reported that the patient is feeling a little better. Patient is scheduled for an OV with Dr. Jesus on 1/17.

## 2023-02-08 ENCOUNTER — Other Ambulatory Visit: Payer: Self-pay

## 2023-02-08 ENCOUNTER — Ambulatory Visit (INDEPENDENT_AMBULATORY_CARE_PROVIDER_SITE_OTHER): Payer: Medicare HMO | Admitting: Internal Medicine

## 2023-02-08 VITALS — BP 161/66 | HR 80 | Temp 97.6°F | Ht 70.0 in | Wt 147.0 lb

## 2023-02-08 DIAGNOSIS — G8929 Other chronic pain: Secondary | ICD-10-CM | POA: Diagnosis not present

## 2023-02-08 DIAGNOSIS — A4901 Methicillin susceptible Staphylococcus aureus infection, unspecified site: Secondary | ICD-10-CM

## 2023-02-08 DIAGNOSIS — M25561 Pain in right knee: Secondary | ICD-10-CM | POA: Diagnosis not present

## 2023-02-08 NOTE — Patient Instructions (Signed)
 Stop antibiotics today   See [place] on 02/22/23   If fever, chill, worsening pain/swelling in knee let us know   Only way to tell if infection is gone is to get you off antibiotics and see how you do

## 2023-02-08 NOTE — Progress Notes (Signed)
 Subjective:   Chief Complaint: follow-up for disseminated MSSA infection with bacteremia, left septic wrist and concerns for right knee infection   Patient ID: Michael Bender, male    DOB: [birth year], 78 y.o.   MRN: [id number]  HPI  78 y.o. male with history of cervical lumbar spine hardware and recent admission for falls weakness and rhabdomyolysis who was discharged to skilled nursing facility but then brought back with sepsis due to MSSA bacteremia.  He had fallen but also headaches was at pain in his left wrist as well as pain in his right knee and index finger.   He has been proven to have a left septic wrist and now status post I&D.     MRI of the hand shows multifocal osteoarthritic changes throughout the second hand most advanced at the second MCP joint with nonspecific marrow edema enhancement around the second metacarpal phalangeal joint with surrounding soft tissue swelling concerning for inflammatory arthropathy versus septic arthritis   MRI of the C-spine does show some mild reactive endplate changes and marrow edema from C5-C6 which could be degenerative or could be early osteomyelitis discitis.   Orthopedic hand surgery followed him closely in the hospital and orthopedics also evaluated his knee several times and did not feel it was infected.   I had asked them to see him and talk to Dr. Alan.  My concern was that while the patient never grew an organism from his knee and while his cell count in the knee at 8500 with 94% neutrophils while NOT typically high value of 50-100K it was NOT normal and we have not alternative explanation for why he had a joint effusion and we knew that he had MSSA bacteremia and we also knew that he had been on antibiotics for several days prior to the knee having been tapped.    Interim history:   He had been residing at [facility] receiving cefazolin  every 8 hours.   At last visit he had been doing relatively well with  the exception of the right knee which continues to bother him that fluid has built up and he also was having reduced range of motion he has pain when he puts weight on it and sometimes if he has vigorous physical therapy the pain goes on for hours afterwards. His inflammatory markers were also markedly elevated.  We obtained urgent follow-up with [facility] the following day.  They reportedly were still not thinking that he had evidence for infection in the knee but did make a follow-up appointment for him  Have the report from that visit with Dr. Alan see below      Dr. Sherida saw the patient again on the 4th but I do not have access to those notes.  I ordered MRI of the knee which was completed on 01/05/2023 but was read this morning on  1217/2024    IMPRESSION: 1. Worsening of the prior tear in the posterior horn of the medial meniscus, now with a large radial component in the posterior horn adjacent to the meniscal root. 2. Worsening of the subcortical stress fracture or osteochondral lesion anteromedially along the medial femoral condyle with worsened adjacent marrow edema. New subcortical stress fracture or osteochondral lesion along the medial tibial plateau with surrounding marrow edema. 3. Moderate to large knee joint effusion with mildly worsened synovitis compared to previous, currently moderate. Given the worsening I cannot confidently exclude the possibility of early septic joint, and arthrocentesis may be warranted if  there is clinical suspicion. 4. 0.8 cm curvilinear suspected free osteochondral fragment just posterior to the lateral portion of the medial femoral condyle. 5. Moderate to severe tricompartmental osteoarthritis.     The patient and his son (1 of 2 who accompany him from time to time) are voicing desire for 2nd opinion and I am frankly frustrated that it has been more than 2 months if not longer. I dont understand why a bacteremic pt with a septic  wrist w a knee effusion with 8k cells on abx would not be considered potentially infected and it would seem prudent to have an I and D at minimum arthroscopically.  I will reach out and see if I can find someone for a 2nd opinion. If not will reconnect with Dr. Sherida  I may also soon take him OFF of antibiotics and if I cannot find an orthopedist to work on him aspirate his knee off of antibiotics 2-4 weeks after stopping abx.  His knee pain incidentally is continuing to worsen particularly when he bears weight.  -------------------- 02/08/23 id clinic f/u Hx mssa infection no tee previously 6 weeks cefazolin  by 12/21/22 On cefadroxil  now Has lumbar spine hardware no new sx there Knee pain and tapped but on abx so 8k wbc and cx negative; right knee  Today f/u for consideration stopping abx vs more workup  Mri from 12/5 reviewed of the right knee  Saw orthopedics clinic yesterday who doesn't think there is an infection at this time  Still on cefadroxil  and taking since iv abx finished Knee pain 8/10 constant stable; pain worse with twisting motion  No fever/chill  No n/v/diarrhea  No other focal pain except the knee        Lab Results  Component Value Date   CRP 5.6 01/17/2023   Lab Results  Component Value Date   ESRSEDRATE 36 (H) 01/17/2023     Past Medical History:  Diagnosis Date   Arthritis    Back   Blood transfusion    as a child   Chronic back pain    COPD (chronic obstructive pulmonary disease) (HCC)    Disturbance of skin sensation 05/22/2018   Effusion of right knee joint 01/16/2023   Essential hypertension 03/05/2007   Qualifier: Diagnosis of   By: Krystal MD, Reyes A      Gastric erosion    Gastritis and gastroduodenitis    Gastrointestinal hemorrhage with melena 11/20/2018   GIB (gastrointestinal bleeding) 07/18/2019   Headache(784.0)    last one 6 months ago   Hemorrhoids, internal    History of duodenal ulcer    History of lumbar fusion  03/03/2022   RE-OPERATIVE DIAGNOSIS:  lumbar stenosis synovial cyst lumbar spondylosis spondylolisthesis lumbar radiculoapthy L4/5   PROCEDURE:  Procedure(s): POSTERIOR LUMBAR FUSION 1 LEVEL with resection of synovial cyst     History of upper gastrointestinal bleeding 03/03/2022   Duodenal ulcer 2021 Dr. Avram   HLD (hyperlipidemia)    Hypertension    Loss of weight    Wt Readings from Last 10 Encounters:  04/05/22  146 lb (66.2 kg)  03/03/22  143 lb 9.6 oz (65.1 kg)  07/14/21  153 lb (69.4 kg)  12/14/20  147 lb 12.8 oz (67 kg)  11/13/19  154 lb (69.9 kg)  09/16/19  146 lb (66.2 kg)  08/06/19  146 lb (66.2 kg)  07/19/19  144 lb 13.5 oz (65.7 kg)  07/17/19  148 lb (67.1 kg)  07/09/19  147 lb 9.6  oz (67 kg)         Neck rigidity    post cervical fusion   Nocturia    PAIN, CHRONIC NEC 10/06/2006   Qualifier: Diagnosis of   By: Krystal RN, Ellen       Pneumonia    Prostate disease    S/P cervical spinal fusion 03/03/2022   With persistent cervical pain and palpable screws Led to disability History of attempt to dig furrow in skull to fix it Last surgery 1992    Past Surgical History:  Procedure Laterality Date   BIOPSY  07/19/2019   Procedure: BIOPSY;  Surgeon: San Sandor GAILS, DO;  Location: [facility];  Service: Gastroenterology;;   CERVICAL FUSION  1992   C2/C 3  four surgeries   COLONOSCOPY     ESOPHAGOGASTRODUODENOSCOPY  07/20/2011   Procedure: ESOPHAGOGASTRODUODENOSCOPY (EGD);  Surgeon: Lupita FORBES Commander, MD;  Location: [facility];  Service: Endoscopy;  Laterality: N/A;   ESOPHAGOGASTRODUODENOSCOPY (EGD) WITH PROPOFOL  N/A 07/19/2019   Procedure: ESOPHAGOGASTRODUODENOSCOPY (EGD) WITH PROPOFOL ;  Surgeon: San Sandor GAILS, DO;  Location: [facility];  Service: Gastroenterology;  Laterality: N/A;   I & D EXTREMITY Left 11/09/2022   Procedure: IRRIGATION AND DEBRIDEMENT LEFT WRIST;  Surgeon: Arlinda Buster, MD;  Location: [facility];  Service: Orthopedics;  Laterality: Left;    SAVORY DILATION  07/20/2011   Procedure: SAVORY DILATION;  Surgeon: Lupita FORBES Commander, MD;  Location: [facility];  Service: Endoscopy;  Laterality: N/A;   SPINAL FUSION  05/06/11    Family History  Problem Relation Age of Onset   Heart disease Mother    Heart attack Mother 36   Pancreatic cancer Father 31   Pancreatic cancer Brother    Anesthesia problems Neg Hx    Colon cancer Neg Hx    Liver cancer Neg Hx    Stomach cancer Neg Hx    Esophageal cancer Neg Hx    Rectal cancer Neg Hx       Social History   Socioeconomic History   Marital status: Divorced    Spouse name: Not on file   Number of children: 2   Years of education: Not on file   Highest education level: Not on file  Occupational History   Occupation: Disabled  Tobacco Use   Smoking status: Some Days    Current packs/day: 0.00    Types: Cigarettes    Start date: 12/12/1978    Last attempt to quit: 12/12/2018    Years since quitting: 4.1   Smokeless tobacco: Never  Vaping Use   Vaping status: Never Used  Substance and Sexual Activity   Alcohol  use: Not Currently    Alcohol /week: 2.0 standard drinks of alcohol     Types: 2 Shots of liquor per week   Drug use: No   Sexual activity: Not on file  Other Topics Concern   Not on file  Social History Narrative   Patient is divorced 2 sons he is a retired engineer, 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  He drinks about a liter of caffeinated drinks a day past smoker no drug use no alcohol .   Social Drivers of Corporate Investment Banker Strain: Low Risk  (10/13/2022)   Overall Financial Resource Strain (CARDIA)    Difficulty of Paying Living Expenses: Not hard at all  Food Insecurity: No Food Insecurity (01/05/2023)   Hunger Vital Sign    Worried About Running Out of Food in the  Last Year: Never true    Ran Out of Food in the Last Year: Never true  Transportation Needs: No Transportation Needs (01/05/2023)   PRAPARE - Therapist, Art (Medical): No    Lack of Transportation (Non-Medical): No  Physical Activity: Insufficiently Active (10/13/2022)   Exercise Vital Sign    Days of Exercise per Week: 3 days    Minutes of Exercise per Session: 30 min  Stress: No Stress Concern Present (10/13/2022)   [employer] of Occupational Health - Occupational Stress Questionnaire    Feeling of Stress : Not at all  Social Connections: Socially Isolated (10/13/2022)   Social Connection and Isolation Panel [NHANES]    Frequency of Communication with Friends and Family: Twice a week    Frequency of Social Gatherings with Friends and Family: More than three times a week    Attends Religious Services: Never    Database Administrator or Organizations: No    Attends Banker Meetings: Never    Marital Status: Widowed    Allergies  Allergen Reactions   Nubain [Nalbuphine Hcl]     Muscle contraction     Current Outpatient Medications:    amitriptyline  (ELAVIL ) 50 MG tablet, Take 50 mg by mouth in the morning, at noon, and at bedtime., Disp: , Rfl:    amLODipine  (NORVASC ) 10 MG tablet, Take 1 tablet (10 mg total) by mouth daily., Disp: 30 tablet, Rfl: 0   atorvastatin  (LIPITOR) 40 MG tablet, Take 1 tablet (40 mg total) by mouth daily., Disp: 30 tablet, Rfl: 0   cefadroxil  (DURICEF) 500 MG capsule, Take 2 capsules (1,000 mg total) by mouth 2 (two) times daily., Disp: 120 capsule, Rfl: 2   cyclobenzaprine  (FLEXERIL ) 10 MG tablet, Take 10 mg by mouth 3 (three) times daily as needed., Disp: , Rfl:    metoprolol  tartrate (LOPRESSOR ) 25 MG tablet, Take 1 tablet (25 mg total) by mouth 2 (two) times daily., Disp: , Rfl:    Oxycodone  HCl 10 MG TABS, Take 1 tablet (10 mg total) by mouth every 8 (eight) hours as needed., Disp: 90 tablet, Rfl: 0   pantoprazole  (PROTONIX ) 40 MG tablet, TAKE 1 TABLET BY MOUTH EVERY DAY BEFORE BREAKFAST, Disp: 90 tablet, Rfl: 0   polyethylene glycol (MIRALAX  / GLYCOLAX ) 17 g  packet, Take 17 g by mouth daily as needed for moderate constipation., Disp: 14 each, Rfl: 0   potassium chloride  SA (KLOR-CON  M) 20 MEQ tablet, Take 1 tablet (20 mEq total) by mouth 2 (two) times daily., Disp: 30 tablet, Rfl: 0   SUMAtriptan  (IMITREX ) 50 MG tablet, TAKE 1 TABLET (50 MG TOTAL) BY MOUTH DAILY. MAY REPEAT IN 2 HOURS IF HEADACHE PERSISTS OR RECURS., Disp: 9 tablet, Rfl: 1   tamsulosin  (FLOMAX ) 0.4 MG CAPS capsule, Take 1 capsule (0.4 mg total) by mouth daily after supper., Disp: 30 capsule, Rfl: 0   traZODone  (DESYREL ) 100 MG tablet, Take 100 mg by mouth at bedtime., Disp: , Rfl:    metFORMIN  (GLUCOPHAGE ) 500 MG tablet, Take 1 tablet (500 mg total) by mouth 2 (two) times daily with a meal. (Patient not taking: Reported on 02/08/2023), Disp: 30 tablet, Rfl: 0   rosuvastatin  (CRESTOR ) 40 MG tablet, Take 40 mg by mouth daily. (Patient not taking: Reported on 02/08/2023), Disp: , Rfl:    Ros: All other ros negative     Objective:   Physical Exam Constitutional:      Appearance: He is well-developed.  HENT:     Head: Normocephalic and atraumatic.  Eyes:     Conjunctiva/sclera: Conjunctivae normal.  Cardiovascular:     Rate and Rhythm: Normal rate and regular rhythm.  Pulmonary:     Effort: Pulmonary effort is normal. No respiratory distress.     Breath sounds: No wheezing.  Abdominal:     General: There is no distension.     Palpations: Abdomen is soft.  Musculoskeletal:        General: No tenderness. Normal range of motion.     Cervical back: Normal range of motion and neck supple.  Skin:    General: Skin is warm and dry.     Coloration: Skin is not pale.     Findings: No erythema or rash.  Neurological:     General: No focal deficit present.     Mental Status: He is alert and oriented to person, place, and time.  Psychiatric:        Mood and Affect: Mood normal.        Behavior: Behavior normal.        Thought Content: Thought content normal.        Judgment:  Judgment normal.    Right knee 12/28/22:     Right knee 01/17/2023:     Knee 01/17/2023:    Left wrist 12/28/2022:    See knee exam finding in a&p     Assessment & Plan:   78 year old patient admitted with MSSA bacteremia and sepsis with left septic wrist status post I&D also with knee effusion with over 8000 white blood cells when tapped on IV antibiotics.  I remain concerned that the knee is infected his left wrist appears cured and orthopedic surgery hand have reviewed least him.  Of note he also had imaging of his spine performed which showed possibly some reactive endplate marrow changes in the C-spine thought to be degenerative versus infectious.  At present today we will get a sed rate CRP CBC with differential and BMP with GFR.  I will continue cefadroxil  for now.  I will endeavor to get him a second opinion but if that is not successful we will reengage with Dr. Sherida and advocate strongly for surgery.  If we are still not making progress I will stop his antibiotics and have knee aspirated by IR a month after stopping  --------- 02/08/23 id assessment Stable knee sx and no sign systemic sepsis and also normalized sed rate/crp on a few months of abx now Course of abx finished for presumed endocarditis and septic arthritis  Discuss only way to tell really if his infection (at this time the right knee) is cured is observation off abx  I am not sure if any imaging even with abnormality would help further workup at this time   Exam today no swelling or warmth right knee; some tenderness medial anterior joint line  -stop abx -f/u 2 weeks from now to repeat crp/cbc (1/22)  -if sx continues to be stable then another couple visits every 6 weeks up to 3 months from stopping abx  -if continues to do well then infection likely cured and can discharge from clinic -if f/c, worsening knee pain swelling redness after stopping abx let us  know and we can see sooner as  needed  -since sx stable today will not do labs

## 2023-02-09 ENCOUNTER — Ambulatory Visit: Payer: Self-pay

## 2023-02-09 NOTE — Patient Outreach (Signed)
  Care Coordination   Follow Up Visit Note   02/09/2023 Name: Michael Bender MRN: [id number] DOB: 04/07/1945  Michael Bender is a 78 y.o. year old male who sees Michael Bernardino MATSU, MD for primary care. I  spoke with son Michael Bender  What matters to the patients health and wellness today?  Getting stronger    Goals Addressed             This Visit's Progress    Getting Strength back after sepsis       Patient Goals/Self Care Activities: -Patient/Caregiver will take medications as prescribed   -Patient/Caregiver will attend all scheduled provider appointments -Patient/Caregiver will call provider office for new concerns or questions     Spoke with Michael Bender, he reports patient is better and that he is off antibiotics and that orthopedic did not think sepsis was from his right knee.  Michael Bender reports patient is better daily and is stronger and endurance is better.  Stressed the importance of continuing exercise to maintain strength.  He verbalized understanding.  Son agreeable to continue nurse calls for education and support.          SDOH assessments and interventions completed:  Yes     Care Coordination Interventions:  Yes, provided   Follow up plan: Follow up call scheduled for March    Encounter Outcome:  Patient Visit Completed   Michael Hruska J Lealand Elting, RN, MSN RN Care Manager [facility], Population Health Direct Dial: [phone]  Fax: [phone] Website: [email or web address]

## 2023-02-09 NOTE — Patient Instructions (Signed)
 Visit Information  Thank you for taking time to visit with me today. Please don't hesitate to contact me if I can be of assistance to you.   Following are the goals we discussed today:   Goals Addressed             This Visit's Progress    Getting Strength back after sepsis       Patient Goals/Self Care Activities: -Patient/Caregiver will take medications as prescribed   -Patient/Caregiver will attend all scheduled provider appointments -Patient/Caregiver will call provider office for new concerns or questions     Spoke with Gilmer Pierce, he reports patient is better and that he is off antibiotics and that orthopedic did not think sepsis was from his right knee.  Pierce reports patient is better daily and is stronger and endurance is better.  Stressed the importance of continuing exercise to maintain strength.  He verbalized understanding.  Son agreeable to continue nurse calls for education and support.          Our next appointment is by telephone on 04/06/23 at 1100 am  Please call the care guide team at [phone] if you need to cancel or reschedule your appointment.   If you are experiencing a Mental Health or Behavioral Health Crisis or need someone to talk to, please call the Suicide and Crisis Lifeline: 988   Patient verbalizes understanding of instructions and care plan provided today and agrees to view in MyChart. Active MyChart status and patient understanding of how to access instructions and care plan via MyChart confirmed with patient.     The patient has been provided with contact information for the care management team and has been advised to call with any health related questions or concerns.   Faustino Luecke J Tyrus Wilms, RN, MSN RN Care Manager [facility], Population Health Direct Dial: [phone]  Fax: [phone] Website: [email or web address]

## 2023-02-17 ENCOUNTER — Ambulatory Visit (INDEPENDENT_AMBULATORY_CARE_PROVIDER_SITE_OTHER): Payer: Medicare HMO | Admitting: Internal Medicine

## 2023-02-17 ENCOUNTER — Encounter: Payer: Self-pay | Admitting: Internal Medicine

## 2023-02-17 ENCOUNTER — Emergency Department (HOSPITAL_COMMUNITY): Payer: Medicare HMO

## 2023-02-17 ENCOUNTER — Encounter (HOSPITAL_COMMUNITY): Payer: Self-pay | Admitting: Emergency Medicine

## 2023-02-17 ENCOUNTER — Other Ambulatory Visit: Payer: Self-pay

## 2023-02-17 ENCOUNTER — Emergency Department (HOSPITAL_COMMUNITY)
Admission: EM | Admit: 2023-02-17 | Discharge: 2023-02-17 | Disposition: A | Discharge status: Home / Self Care | Payer: Medicare HMO | Attending: Emergency Medicine | Admitting: Emergency Medicine

## 2023-02-17 VITALS — BP 140/60 | HR 75 | Temp 97.3°F | Wt 146.2 lb

## 2023-02-17 DIAGNOSIS — R531 Weakness: Secondary | ICD-10-CM | POA: Diagnosis not present

## 2023-02-17 DIAGNOSIS — R296 Repeated falls: Secondary | ICD-10-CM | POA: Diagnosis not present

## 2023-02-17 DIAGNOSIS — M19021 Primary osteoarthritis, right elbow: Secondary | ICD-10-CM | POA: Diagnosis not present

## 2023-02-17 DIAGNOSIS — J449 Chronic obstructive pulmonary disease, unspecified: Secondary | ICD-10-CM | POA: Insufficient documentation

## 2023-02-17 DIAGNOSIS — Z79899 Other long term (current) drug therapy: Secondary | ICD-10-CM | POA: Insufficient documentation

## 2023-02-17 DIAGNOSIS — S0990XA Unspecified injury of head, initial encounter: Secondary | ICD-10-CM | POA: Diagnosis not present

## 2023-02-17 DIAGNOSIS — R519 Headache, unspecified: Secondary | ICD-10-CM | POA: Insufficient documentation

## 2023-02-17 DIAGNOSIS — M778 Other enthesopathies, not elsewhere classified: Secondary | ICD-10-CM | POA: Diagnosis not present

## 2023-02-17 DIAGNOSIS — E860 Dehydration: Secondary | ICD-10-CM | POA: Insufficient documentation

## 2023-02-17 DIAGNOSIS — R0989 Other specified symptoms and signs involving the circulatory and respiratory systems: Secondary | ICD-10-CM

## 2023-02-17 DIAGNOSIS — B9561 Methicillin susceptible Staphylococcus aureus infection as the cause of diseases classified elsewhere: Secondary | ICD-10-CM

## 2023-02-17 DIAGNOSIS — Z9189 Other specified personal risk factors, not elsewhere classified: Secondary | ICD-10-CM

## 2023-02-17 DIAGNOSIS — Z981 Arthrodesis status: Secondary | ICD-10-CM | POA: Diagnosis not present

## 2023-02-17 DIAGNOSIS — I10 Essential (primary) hypertension: Secondary | ICD-10-CM | POA: Insufficient documentation

## 2023-02-17 DIAGNOSIS — R9089 Other abnormal findings on diagnostic imaging of central nervous system: Secondary | ICD-10-CM | POA: Diagnosis not present

## 2023-02-17 DIAGNOSIS — R4182 Altered mental status, unspecified: Secondary | ICD-10-CM | POA: Diagnosis not present

## 2023-02-17 DIAGNOSIS — F17210 Nicotine dependence, cigarettes, uncomplicated: Secondary | ICD-10-CM | POA: Insufficient documentation

## 2023-02-17 DIAGNOSIS — R7881 Bacteremia: Secondary | ICD-10-CM

## 2023-02-17 DIAGNOSIS — I6782 Cerebral ischemia: Secondary | ICD-10-CM | POA: Diagnosis not present

## 2023-02-17 DIAGNOSIS — M25521 Pain in right elbow: Secondary | ICD-10-CM | POA: Diagnosis not present

## 2023-02-17 DIAGNOSIS — J439 Emphysema, unspecified: Secondary | ICD-10-CM | POA: Diagnosis not present

## 2023-02-17 DIAGNOSIS — R2689 Other abnormalities of gait and mobility: Secondary | ICD-10-CM

## 2023-02-17 DIAGNOSIS — Z8673 Personal history of transient ischemic attack (TIA), and cerebral infarction without residual deficits: Secondary | ICD-10-CM | POA: Diagnosis not present

## 2023-02-17 LAB — CBC WITH DIFFERENTIAL/PLATELET
Abs Immature Granulocytes: 0.01 10*3/uL (ref 0.00–0.07)
Basophils Absolute: 0 10*3/uL (ref 0.0–0.1)
Basophils Relative: 0 %
Eosinophils Absolute: 0.1 10*3/uL (ref 0.0–0.5)
Eosinophils Relative: 1 %
HCT: 27.3 % — ABNORMAL LOW (ref 39.0–52.0)
Hemoglobin: 8.9 g/dL — ABNORMAL LOW (ref 13.0–17.0)
Immature Granulocytes: 0 %
Lymphocytes Relative: 24 %
Lymphs Abs: 1.2 10*3/uL (ref 0.7–4.0)
MCH: 31.2 pg (ref 26.0–34.0)
MCHC: 32.6 g/dL (ref 30.0–36.0)
MCV: 95.8 fL (ref 80.0–100.0)
Monocytes Absolute: 0.5 10*3/uL (ref 0.1–1.0)
Monocytes Relative: 10 %
Neutro Abs: 3.2 10*3/uL (ref 1.7–7.7)
Neutrophils Relative %: 65 %
Platelets: 141 10*3/uL — ABNORMAL LOW (ref 150–400)
RBC: 2.85 MIL/uL — ABNORMAL LOW (ref 4.22–5.81)
RDW: 13.2 % (ref 11.5–15.5)
WBC: 5 10*3/uL (ref 4.0–10.5)
nRBC: 0 % (ref 0.0–0.2)

## 2023-02-17 LAB — COMPREHENSIVE METABOLIC PANEL
ALT: 11 U/L (ref 0–44)
AST: 32 U/L (ref 15–41)
Albumin: 3.1 g/dL — ABNORMAL LOW (ref 3.5–5.0)
Alkaline Phosphatase: 79 U/L (ref 38–126)
Anion gap: 11 (ref 5–15)
BUN: 26 mg/dL — ABNORMAL HIGH (ref 8–23)
CO2: 23 mmol/L (ref 22–32)
Calcium: 8.5 mg/dL — ABNORMAL LOW (ref 8.9–10.3)
Chloride: 105 mmol/L (ref 98–111)
Creatinine, Ser: 2.1 mg/dL — ABNORMAL HIGH (ref 0.61–1.24)
GFR, Estimated: 32 mL/min — ABNORMAL LOW (ref >60)
Glucose, Bld: 188 mg/dL — ABNORMAL HIGH (ref 70–99)
Potassium: 3.6 mmol/L (ref 3.5–5.1)
Sodium: 139 mmol/L (ref 135–145)
Total Bilirubin: 0.5 mg/dL (ref 0.0–1.2)
Total Protein: 6.3 g/dL — ABNORMAL LOW (ref 6.5–8.1)

## 2023-02-17 LAB — URINALYSIS, ROUTINE W REFLEX MICROSCOPIC
Bacteria, UA: NONE SEEN
Bilirubin Urine: NEGATIVE
Glucose, UA: NEGATIVE mg/dL
Ketones, ur: NEGATIVE mg/dL
Leukocytes,Ua: NEGATIVE
Nitrite: NEGATIVE
Protein, ur: 30 mg/dL — AB
Specific Gravity, Urine: 1.011 (ref 1.005–1.030)
pH: 5 (ref 5.0–8.0)

## 2023-02-17 LAB — C-REACTIVE PROTEIN: CRP: 0.8 mg/dL (ref <1.0)

## 2023-02-17 LAB — SEDIMENTATION RATE: Sed Rate: 18 mm/h — ABNORMAL HIGH (ref 0–16)

## 2023-02-17 LAB — CBG MONITORING, ED: Glucose-Capillary: 164 mg/dL — ABNORMAL HIGH (ref 70–99)

## 2023-02-17 MED ORDER — SODIUM CHLORIDE 0.9 % IV BOLUS
1000.0000 mL | Freq: Once | INTRAVENOUS | Status: AC
  Administered 2023-02-17: 1000 mL via INTRAVENOUS

## 2023-02-17 MED ORDER — GADOBUTROL 1 MMOL/ML IV SOLN
7.0000 mL | Freq: Once | INTRAVENOUS | Status: AC | PRN
  Administered 2023-02-17: 7 mL via INTRAVENOUS

## 2023-02-17 NOTE — Progress Notes (Signed)
==============================  [facility]: [phone]   -- Medical Office Visit --  Patient: Michael Bender      Age: 78 y.o.       Sex:  male  Date:   02/17/2023 Today's Healthcare Provider: Lula Olszewski, MD  ==============================   CHIEF COMPLAINT: 1 month follow-up    SUBJECTIVE: 78 y.o. male who has Pulmonary emphysema (HCC); GERD; CONSTIPATION, CHRONIC; DISC DISEASE, LUMBAR; Bilateral leg pain; Esophageal dysphagia; Right sided temporal headache; Insomnia; PAD (peripheral artery disease) (HCC); Senile ecchymosis; History of CVA (cerebrovascular accident); HLD (hyperlipidemia); COPD (chronic obstructive pulmonary disease) (HCC); Iron deficiency anemia; Duodenal stenosis; Chronic narcotic dependence (HCC); Atherosclerosis of aorta (HCC); History of fusion of cervical spine; History of lumbar fusion; High risk medication use; Intractable pain; Gynecomastia, male; Cachexia (HCC); Underweight on examination; Chronic low back pain with bilateral sciatica; AKI (acute kidney injury) (HCC); Syncope; MSSA bacteremia; Septic infrapatellar bursitis of right knee; Staphylococcal arthritis of left wrist (HCC); Infected blister of left index finger; Staphylococcal arthritis of right knee (HCC); Neck pain; Finger pain, left; Septic arthritis of wrist, left (HCC); Cervical discitis; Hardware complicating wound infection (HCC); Effusion of right knee joint; Memory loss; Frequent falls; and Nonintractable headache on their problem list.  History of Present Illness The patient, with a complex medical history, presents with a chief complaint of recurrent falls at home. The patient's mental status appears to have deteriorated, with the patient demonstrating difficulty in recalling the current year. The patient's mobility has also been compromised, with the patient reporting frequent falls at home since September. The patient's balance appears to be  significantly impaired, with the patient unable to take a big step, stand with eyes closed, stand on one foot, or touch the tip of his nose reliably.  The patient's recent MRI findings revealed white spots throughout the brain, suggestive of possible septic emboli. The patient has a history of infection, which has been ongoing for the past year. The patient was previously on oral antibiotics, but these were discontinued approximately two weeks ago. The patient denies any recent fevers.  The patient's knee, which was previously infected, appears to have improved. However, the patient's balance and mobility issues persist, raising concerns about the patient's safety at home. The patient lives alone and has been falling frequently, which poses a significant risk for severe injury.  The patient also has a history of chronic headaches, which have been present for approximately 30 years. The patient has hardware in the neck and lower back from previous surgeries, which could potentially harbor infection. However, the patient denies any increased pain in these areas.  The patient's mental status and balance issues, along with the MRI findings, raise concerns about a possible ongoing infection in the brain. The patient's history of infection, coupled with the recent discontinuation of antibiotics, further complicates the clinical picture. The patient's safety at home is a significant concern due to the frequent falls and impaired mobility.  Past Medical History - Knee pain - Mental status deterioration - Falls - Sepsis - Migraine for 30 years  Note that patient  has a past medical history of Arthritis, Blood transfusion, Chronic back pain, COPD (chronic obstructive pulmonary disease) (HCC), Disturbance of skin sensation (05/22/2018), Effusion of right knee joint (01/16/2023), Essential hypertension (03/05/2007), Gastric erosion, Gastritis and gastroduodenitis, Gastrointestinal hemorrhage with melena  (11/20/2018), GIB (gastrointestinal bleeding) (07/18/2019), Headache(784.0), Hemorrhoids, internal, History of duodenal ulcer, History of lumbar fusion (03/03/2022), History of upper gastrointestinal bleeding (03/03/2022),  HLD (hyperlipidemia), Hypertension, Loss of weight, Neck rigidity, Nocturia, PAIN, CHRONIC NEC (10/06/2006), Pneumonia, Prostate disease, and S/P cervical spinal fusion (03/03/2022).    Problem list overviews that were updated at today's visit: Problem  Frequent Falls  Nonintractable Headache    Today's Verbally Confirmed  Current Outpatient Medications on File Prior to Visit  Medication Sig   amitriptyline (ELAVIL) 50 MG tablet Take 50 mg by mouth in the morning, at noon, and at bedtime.   amLODipine (NORVASC) 10 MG tablet Take 1 tablet (10 mg total) by mouth daily.   atorvastatin (LIPITOR) 40 MG tablet Take 1 tablet (40 mg total) by mouth daily.   cyclobenzaprine (FLEXERIL) 10 MG tablet Take 10 mg by mouth 3 (three) times daily as needed.   metoprolol tartrate (LOPRESSOR) 25 MG tablet Take 1 tablet (25 mg total) by mouth 2 (two) times daily.   Oxycodone HCl 10 MG TABS Take 1 tablet (10 mg total) by mouth every 8 (eight) hours as needed.   pantoprazole (PROTONIX) 40 MG tablet TAKE 1 TABLET BY MOUTH EVERY DAY BEFORE BREAKFAST   polyethylene glycol (MIRALAX / GLYCOLAX) 17 g packet Take 17 g by mouth daily as needed for moderate constipation.   potassium chloride SA (KLOR-CON M) 20 MEQ tablet Take 1 tablet (20 mEq total) by mouth 2 (two) times daily.   SUMAtriptan (IMITREX) 50 MG tablet TAKE 1 TABLET (50 MG TOTAL) BY MOUTH DAILY. MAY REPEAT IN 2 HOURS IF HEADACHE PERSISTS OR RECURS.   traZODone (DESYREL) 100 MG tablet Take 100 mg by mouth at bedtime.   cefadroxil (DURICEF) 500 MG capsule Take 2 capsules (1,000 mg total) by mouth 2 (two) times daily. (Patient not taking: Reported on 02/17/2023)   metFORMIN (GLUCOPHAGE) 500 MG tablet Take 1 tablet (500 mg total) by mouth 2  (two) times daily with a meal. (Patient not taking: Reported on 02/17/2023)   rosuvastatin (CRESTOR) 40 MG tablet Take 40 mg by mouth daily. (Patient not taking: Reported on 02/08/2023)   tamsulosin (FLOMAX) 0.4 MG CAPS capsule Take 1 capsule (0.4 mg total) by mouth daily after supper. (Patient not taking: Reported on 02/17/2023)   No current facility-administered medications on file prior to visit.  There are no discontinued medications.    Objective   Physical Exam     02/17/2023   11:47 AM 02/17/2023   11:39 AM 02/08/2023   11:15 AM  Vitals with BMI  Height   5\' 10"   Weight  146 lbs 3 oz 147 lbs  BMI  20.98 21.09  Systolic 140 140 952  Diastolic 60 60 66  Pulse  75 80   Wt Readings from Last 10 Encounters:  02/17/23 146 lb 3.2 oz (66.3 kg)  02/08/23 147 lb (66.7 kg)  01/19/23 147 lb (66.7 kg)  01/17/23 147 lb (66.7 kg)  12/28/22 120 lb (54.4 kg)  11/05/22 134 lb 14.7 oz (61.2 kg)  11/02/22 134 lb 14.7 oz (61.2 kg)  10/13/22 141 lb (64 kg)  06/03/22 141 lb 12.8 oz (64.3 kg)  05/03/22 142 lb 12.8 oz (64.8 kg)   Vital signs reviewed.  Nursing notes reviewed. Weight trend reviewed. Abnormalities and Problem-Specific physical exam findings:  Physical Exam EXTREMITIES: Knee appearance improved, no pain on palpation. NEUROLOGICAL: Eyes aligned. Unable to take a big step, stand with eyes closed, stand on one foot, or touch the tip of his nose reliably. Worse balance with eyes closed. Very bad hearing which is chronic.    General Appearance:  No  acute distress appreciable.   Well-groomed, healthy-appearing male.  Well proportioned with no abnormal fat distribution.  Good muscle tone. Pulmonary:  Normal work of breathing at rest, no respiratory distress apparent. SpO2: 95 %  Musculoskeletal: All extremities are intact.  Neurological:  Awake, alert, oriented, and engaged.  No obvious focal neurological deficits or cognitive impairments.  Sensorium seems unclouded.   Speech is clear and  coherent with logical content. Psychiatric:  Appropriate mood, pleasant and cooperative demeanor, thoughtful and engaged during the exam    No results found for any visits on 02/17/23. Office Visit on 01/17/2023  Component Date Value   CRP 01/17/2023 5.6    Sed Rate 01/17/2023 36 (H)    Glucose, Bld 01/17/2023 167 (H)    BUN 01/17/2023 21    Creat 01/17/2023 1.71 (H)    eGFR 01/17/2023 40 (L)    BUN/Creatinine Ratio 01/17/2023 12    Sodium 01/17/2023 141    Potassium 01/17/2023 4.7    Chloride 01/17/2023 107    CO2 01/17/2023 26    Calcium 01/17/2023 8.7    Total Protein 01/17/2023 6.6    Albumin 01/17/2023 3.7    Globulin 01/17/2023 2.9    AG Ratio 01/17/2023 1.3    Total Bilirubin 01/17/2023 0.4    Alkaline phosphatase (AP* 01/17/2023 106    AST 01/17/2023 19    ALT 01/17/2023 6 (L)    WBC 01/17/2023 5.6    RBC 01/17/2023 2.69 (L)    Hemoglobin 01/17/2023 8.3 (L)    HCT 01/17/2023 26.1 (L)    MCV 01/17/2023 97.0    MCH 01/17/2023 30.9    MCHC 01/17/2023 31.8 (L)    RDW 01/17/2023 13.1    Platelets 01/17/2023 181    MPV 01/17/2023 10.4    Neutro Abs 01/17/2023 2,638    Absolute Lymphocytes 01/17/2023 2,050    Absolute Monocytes 01/17/2023 521    Eosinophils Absolute 01/17/2023 353    Basophils Absolute 01/17/2023 39    Neutrophils Relative % 01/17/2023 47.1    Total Lymphocyte 01/17/2023 36.6    Monocytes Relative 01/17/2023 9.3    Eosinophils Relative 01/17/2023 6.3    Basophils Relative 01/17/2023 0.7   Lab on 01/02/2023  Component Date Value   WBC 01/02/2023 6.1    RBC 01/02/2023 2.56 (L)    Hemoglobin 01/02/2023 7.9 (L)    HCT 01/02/2023 25.2 (L)    MCV 01/02/2023 98.4    MCH 01/02/2023 30.9    MCHC 01/02/2023 31.3    RDW 01/02/2023 14.1    Platelets 01/02/2023 214    nRBC 01/02/2023 0.0    Neutrophils Relative % 01/02/2023 58    Neutro Abs 01/02/2023 3.5    Lymphocytes Relative 01/02/2023 29    Lymphs Abs 01/02/2023 1.7    Monocytes Relative  01/02/2023 10    Monocytes Absolute 01/02/2023 0.6    Eosinophils Relative 01/02/2023 3    Eosinophils Absolute 01/02/2023 0.2    Basophils Relative 01/02/2023 0    Basophils Absolute 01/02/2023 0.0    Immature Granulocytes 01/02/2023 0    Abs Immature Granulocytes 01/02/2023 0.02   Office Visit on 12/28/2022  Component Date Value   CRP 12/28/2022 32.2 (H)    Sed Rate 12/28/2022 67 (H)    Glucose, Bld 12/28/2022 135 (H)    BUN 12/28/2022 27 (H)    Creat 12/28/2022 1.79 (H)    eGFR 12/28/2022 38 (L)    BUN/Creatinine Ratio 12/28/2022 15    Sodium 12/28/2022 139  Potassium 12/28/2022 5.1    Chloride 12/28/2022 113 (H)    CO2 12/28/2022 18 (L)    Calcium 12/28/2022 8.5 (L)    Total Protein 12/28/2022 6.7    Albumin 12/28/2022 3.5 (L)    Globulin 12/28/2022 3.2    AG Ratio 12/28/2022 1.1    Total Bilirubin 12/28/2022 0.2    Alkaline phosphatase (AP* 12/28/2022 118    AST 12/28/2022 12    ALT 12/28/2022 <3 (L)    WBC 12/28/2022 5.6    RBC 12/28/2022 2.30 (L)    Hemoglobin 12/28/2022 7.1 (L)    HCT 12/28/2022 22.2 (L)    MCV 12/28/2022 96.5    MCH 12/28/2022 30.9    MCHC 12/28/2022 32.0    RDW 12/28/2022 13.1    Platelets 12/28/2022 205    MPV 12/28/2022 9.4    Neutro Abs 12/28/2022 3,875    Absolute Lymphocytes 12/28/2022 1,042    Absolute Monocytes 12/28/2022 465    Eosinophils Absolute 12/28/2022 196    Basophils Absolute 12/28/2022 22    Neutrophils Relative % 12/28/2022 69.2    Total Lymphocyte 12/28/2022 18.6    Monocytes Relative 12/28/2022 8.3    Eosinophils Relative 12/28/2022 3.5    Basophils Relative 12/28/2022 0.4   No results displayed because visit has over 200 results.    Admission on 10/27/2022, Discharged on 11/02/2022  Component Date Value   WBC 10/27/2022 5.4    RBC 10/27/2022 3.37 (L)    Hemoglobin 10/27/2022 10.5 (L)    HCT 10/27/2022 30.7 (L)    MCV 10/27/2022 91.1    MCH 10/27/2022 31.2    MCHC 10/27/2022 34.2    RDW 10/27/2022 13.2     Platelets 10/27/2022 133 (L)    nRBC 10/27/2022 0.0    Color, Urine 10/27/2022 YELLOW    APPearance 10/27/2022 CLEAR    Specific Gravity, Urine 10/27/2022 1.010    pH 10/27/2022 6.0    Glucose, UA 10/27/2022 NEGATIVE    Hgb urine dipstick 10/27/2022 LARGE (A)    Bilirubin Urine 10/27/2022 NEGATIVE    Ketones, ur 10/27/2022 NEGATIVE    Protein, ur 10/27/2022 100 (A)    Nitrite 10/27/2022 NEGATIVE    Leukocytes,Ua 10/27/2022 NEGATIVE    Sodium 10/27/2022 134 (L)    Potassium 10/27/2022 2.1 (LL)    Chloride 10/27/2022 99    CO2 10/27/2022 24    Glucose, Bld 10/27/2022 126 (H)    BUN 10/27/2022 26 (H)    Creatinine, Ser 10/27/2022 4.21 (H)    Calcium 10/27/2022 7.4 (L)    Total Protein 10/27/2022 6.5    Albumin 10/27/2022 2.9 (L)    AST 10/27/2022 126 (H)    ALT 10/27/2022 49 (H)    Alkaline Phosphatase 10/27/2022 153 (H)    Total Bilirubin 10/27/2022 0.2 (L)    GFR, Estimated 10/27/2022 14 (L)    Anion gap 10/27/2022 11    Magnesium 10/27/2022 1.9    RBC / HPF 10/27/2022 11-20    WBC, UA 10/27/2022 0-5    Bacteria, UA 10/27/2022 NONE SEEN    Squamous Epithelial / HPF 10/27/2022 0-5    Total CK 10/27/2022 2,828 (H)    Sodium 10/28/2022 138    Potassium 10/28/2022 2.6 (LL)    Chloride 10/28/2022 104    CO2 10/28/2022 20 (L)    Glucose, Bld 10/28/2022 82    BUN 10/28/2022 25 (H)    Creatinine, Ser 10/28/2022 3.62 (H)    Calcium 10/28/2022 7.6 (L)    GFR, Estimated 10/28/2022  17 (L)    Anion gap 10/28/2022 14    Magnesium 10/28/2022 2.5 (H)    Phosphorus 10/28/2022 3.7    Total CK 10/28/2022 2,043 (H)    Total Protein 10/28/2022 5.5 (L)    Albumin 10/28/2022 2.4 (L)    AST 10/28/2022 95 (H)    ALT 10/28/2022 39    Alkaline Phosphatase 10/28/2022 134 (H)    Total Bilirubin 10/28/2022 0.5    Bilirubin, Direct 10/28/2022 0.2    Indirect Bilirubin 10/28/2022 0.3    Vitamin B-12 10/28/2022 676    Sodium 10/29/2022 142    Potassium 10/29/2022 3.2 (L)    Chloride  10/29/2022 106    CO2 10/29/2022 24    Glucose, Bld 10/29/2022 147 (H)    BUN 10/29/2022 24 (H)    Creatinine, Ser 10/29/2022 3.19 (H)    Calcium 10/29/2022 7.8 (L)    GFR, Estimated 10/29/2022 19 (L)    Anion gap 10/29/2022 12    Total CK 10/29/2022 992 (H)    Folate 10/30/2022 13.1    Iron 10/30/2022 46    TIBC 10/30/2022 223 (L)    Saturation Ratios 10/30/2022 21    UIBC 10/30/2022 177    Ferritin 10/30/2022 224    Retic Ct Pct 10/30/2022 0.6    RBC. 10/30/2022 3.41 (L)    Retic Count, Absolute 10/30/2022 19.8    Immature Retic Fract 10/30/2022 1.8 (L)    Sodium 10/30/2022 143    Potassium 10/30/2022 3.6    Chloride 10/30/2022 101    CO2 10/30/2022 27    Glucose, Bld 10/30/2022 183 (H)    BUN 10/30/2022 17    Creatinine, Ser 10/30/2022 2.28 (H)    Calcium 10/30/2022 8.2 (L)    GFR, Estimated 10/30/2022 29 (L)    Anion gap 10/30/2022 15    Total CK 10/30/2022 802 (H)    WBC 10/31/2022 4.5    RBC 10/31/2022 3.53 (L)    Hemoglobin 10/31/2022 11.3 (L)    HCT 10/31/2022 32.5 (L)    MCV 10/31/2022 92.1    MCH 10/31/2022 32.0    MCHC 10/31/2022 34.8    RDW 10/31/2022 13.1    Platelets 10/31/2022 144 (L)    nRBC 10/31/2022 0.0    Sodium 10/31/2022 140    Potassium 10/31/2022 3.6    Chloride 10/31/2022 104    CO2 10/31/2022 28    Glucose, Bld 10/31/2022 124 (H)    BUN 10/31/2022 13    Creatinine, Ser 10/31/2022 2.13 (H)    Calcium 10/31/2022 8.1 (L)    Total Protein 10/31/2022 5.8 (L)    Albumin 10/31/2022 2.5 (L)    AST 10/31/2022 114 (H)    ALT 10/31/2022 37    Alkaline Phosphatase 10/31/2022 130 (H)    Total Bilirubin 10/31/2022 0.8    GFR, Estimated 10/31/2022 31 (L)    Anion gap 10/31/2022 8    Total CK 10/31/2022 818 (H)    Total CK 11/01/2022 482 (H)    Sodium 11/01/2022 139    Potassium 11/01/2022 2.6 (LL)    Chloride 11/01/2022 98    CO2 11/01/2022 28    Glucose, Bld 11/01/2022 128 (H)    BUN 11/01/2022 11    Creatinine, Ser 11/01/2022 1.65 (H)     Calcium 11/01/2022 8.0 (L)    Total Protein 11/01/2022 5.6 (L)    Albumin 11/01/2022 2.4 (L)    AST 11/01/2022 146 (H)    ALT 11/01/2022 43    Alkaline Phosphatase 11/01/2022 121  Total Bilirubin 11/01/2022 0.2 (L)    GFR, Estimated 11/01/2022 43 (L)    Anion gap 11/01/2022 13    Magnesium 11/01/2022 1.3 (L)    Potassium 11/01/2022 2.7 (LL)    Magnesium 11/01/2022 2.4    Potassium 11/01/2022 3.6    Sodium 11/02/2022 138    Potassium 11/02/2022 3.1 (L)    Chloride 11/02/2022 104    CO2 11/02/2022 25    Glucose, Bld 11/02/2022 195 (H)    BUN 11/02/2022 9    Creatinine, Ser 11/02/2022 1.61 (H)    Calcium 11/02/2022 7.9 (L)    Total Protein 11/02/2022 5.7 (L)    Albumin 11/02/2022 2.3 (L)    AST 11/02/2022 84 (H)    ALT 11/02/2022 37    Alkaline Phosphatase 11/02/2022 117    Total Bilirubin 11/02/2022 0.3    GFR, Estimated 11/02/2022 44 (L)    Anion gap 11/02/2022 9   Office Visit on 05/03/2022  Component Date Value   Amphetamines, Urine 05/03/2022 Negative    Cannabinoid Quant, Ur 05/03/2022 Negative    Cocaine (Metab.) 05/03/2022 Negative    OPIATE QUANTITATIVE URINE 05/03/2022 Negative    PCP Quant, Ur 05/03/2022 Negative    WBC 05/03/2022 7.9    RBC 05/03/2022 4.27    Hemoglobin 05/03/2022 13.5    HCT 05/03/2022 39.9    MCV 05/03/2022 93.6    MCHC 05/03/2022 33.8    RDW 05/03/2022 13.9    Platelets 05/03/2022 223.0    Neutrophils Relative % 05/03/2022 58.4    Lymphocytes Relative 05/03/2022 28.0    Monocytes Relative 05/03/2022 8.8    Eosinophils Relative 05/03/2022 4.3    Basophils Relative 05/03/2022 0.5    Neutro Abs 05/03/2022 4.6    Lymphs Abs 05/03/2022 2.2    Monocytes Absolute 05/03/2022 0.7    Eosinophils Absolute 05/03/2022 0.3    Basophils Absolute 05/03/2022 0.0    Sodium 05/03/2022 138    Potassium 05/03/2022 3.5    Chloride 05/03/2022 101    CO2 05/03/2022 32    Glucose, Bld 05/03/2022 110 (H)    BUN 05/03/2022 14    Creatinine, Ser  05/03/2022 1.06    Total Bilirubin 05/03/2022 0.4    Alkaline Phosphatase 05/03/2022 83    AST 05/03/2022 48 (H)    ALT 05/03/2022 20    Total Protein 05/03/2022 6.5    Albumin 05/03/2022 3.7    GFR 05/03/2022 67.73    Calcium 05/03/2022 8.6    Cholesterol 05/03/2022 93    Triglycerides 05/03/2022 119.0    HDL 05/03/2022 56.30    VLDL 05/03/2022 23.8    LDL Cholesterol 05/03/2022 13    Total CHOL/HDL Ratio 05/03/2022 2    NonHDL 05/03/2022 36.54    Hgb A1c MFr Bld 05/03/2022 6.4    Microalb, Ur 05/03/2022 5.4 (H)    Creatinine,U 05/03/2022 33.0    Microalb Creat Ratio 05/03/2022 16.2    TSH 05/03/2022 1.88    HIV 1&2 Ab, 4th Generati* 05/03/2022 NON-REACTIVE    Sed Rate 05/03/2022 16    CRP 05/03/2022 <1.0   No image results found. MR Brain W Wo Contrast Result Date: 01/30/2023 CLINICAL DATA:  Memory loss. EXAM: MRI HEAD WITHOUT AND WITH CONTRAST TECHNIQUE: Multiplanar, multiecho pulse sequences of the brain and surrounding structures were obtained without and with intravenous contrast. CONTRAST:  9 mL Vueway. COMPARISON:  MRI brain 02/16/2019.  Head CT 11/05/2022. FINDINGS: Brain: No acute infarct or hemorrhage. Stable background of moderate chronic small-vessel disease with sequela  of prior infarct or hemorrhage in the left basal ganglia/corona radiata, and old perforator infarcts in the bilateral thalami. Few foci of chronic microhemorrhage in the bilateral cerebellar hemispheres. No hydrocephalus or extra-axial collection. No mass or midline shift. Faint, punctate foci of enhancement within the posterior aspect of the right superior frontal gyrus (axial image 129 series 119) in the anteromedial aspect of the left cerebellar hemisphere (axial image 32 series 119) without associated signal abnormality on other sequences. No other foci of abnormal enhancement. Vascular: Normal flow voids and vessel enhancement. Skull and upper cervical spine: Normal marrow signal and enhancement.  Sinuses/Orbits: No acute findings. Unchanged chronic right sphenoid sinusitis. Other: None. IMPRESSION: 1. Faint, punctate foci of enhancement within the posterior aspect of the right superior frontal gyrus and anteromedial aspect of the left cerebellar hemisphere without associated signal abnormality on other sequences. These are favored to represent late subacute infarcts. Recommend follow-up MRI in 3 months to ensure resolution. 2. 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 Electronically Signed   By: Orvan Falconer M.D.   On: 01/30/2023 11:35   MR KNEE RIGHT W WO CONTRAST Result Date: 01/17/2023 CLINICAL DATA:  Knee swelling, possible infection EXAM: MRI OF THE RIGHT KNEE WITHOUT AND WITH CONTRAST TECHNIQUE: Multiplanar, multisequence MR imaging of the knee was performed before and after the administration of intravenous contrast. CONTRAST:  6mL GADAVIST GADOBUTROL 1 MMOL/ML IV SOLN COMPARISON:  11/07/2022 FINDINGS: MENISCI Medial meniscus: Worsening of the prior tear, now with a large radial component in the posterior horn adjacent to the meniscal root. Lateral meniscus:  Unremarkable LIGAMENTS Cruciates:  Unremarkable Collaterals:  Unremarkable CARTILAGE Patellofemoral: Severe chondral thinning and chondral irregularity laterally in the patellofemoral joint with mild associated subcortical edema. Marginal spurring. Medial:  Moderate to severe degenerative chondral thinning. Lateral:  Moderate degenerative chondral thinning. Joint: Moderate to large knee joint effusion. Mildly worsened synovitis compared to previous, currently moderate. Thickened medial plica. 0.8 cm curvilinear suspected free osteochondral fragment just posterior to the lateral portion of the medial femoral condyle on image 23 series 4. Popliteal Fossa:  Trace infiltrative edema.  Extensor Mechanism:  Unremarkable Bones: Worsening of the subcortical stress fracture or osteochondral lesion anteromedially along the medial femoral condyle with worsened adjacent marrow edema, image 14 series 9. New subcortical stress fracture or osteochondral lesion along the medial tibial plateau with surrounding marrow edema, image d 15 series 9. Progressive subcortical marrow edema laterally in the patellofemoral joint. Other: No supplemental non-categorized findings. IMPRESSION: 1. Worsening of the prior tear in the posterior horn of the medial meniscus, now with a large radial component in the posterior horn adjacent to the meniscal root. 2. Worsening of the subcortical stress fracture or osteochondral lesion anteromedially along the medial femoral condyle with worsened adjacent marrow edema. New subcortical stress fracture or osteochondral lesion along the medial tibial plateau with surrounding marrow edema. 3. Moderate to large knee joint effusion with mildly worsened synovitis compared to previous, currently moderate. Given the worsening I cannot confidently exclude the possibility of early septic joint, and arthrocentesis may be warranted if there is clinical suspicion. 4. 0.8 cm curvilinear suspected free osteochondral fragment just posterior to the lateral portion of the medial femoral condyle. 5. Moderate to severe tricompartmental osteoarthritis. Electronically Signed   By: Gaylyn Rong M.D.   On: 01/17/2023 08:46  MR Brain W Wo Contrast Result Date: 01/30/2023  CLINICAL DATA:  Memory loss. EXAM: MRI HEAD WITHOUT AND WITH CONTRAST TECHNIQUE: Multiplanar, multiecho pulse sequences of the brain and surrounding structures were obtained without and with intravenous contrast. CONTRAST:  9 mL Vueway. COMPARISON:  MRI brain 02/16/2019.  Head CT 11/05/2022. FINDINGS: Brain: No acute infarct or hemorrhage. Stable background of moderate chronic small-vessel disease with sequela of prior infarct or  hemorrhage in the left basal ganglia/corona radiata, and old perforator infarcts in the bilateral thalami. Few foci of chronic microhemorrhage in the bilateral cerebellar hemispheres. No hydrocephalus or extra-axial collection. No mass or midline shift. Faint, punctate foci of enhancement within the posterior aspect of the right superior frontal gyrus (axial image 129 series 119) in the anteromedial aspect of the left cerebellar hemisphere (axial image 32 series 119) without associated signal abnormality on other sequences. No other foci of abnormal enhancement. Vascular: Normal flow voids and vessel enhancement. Skull and upper cervical spine: Normal marrow signal and enhancement. Sinuses/Orbits: No acute findings. Unchanged chronic right sphenoid sinusitis. Other: None. IMPRESSION: 1. Faint, punctate foci of enhancement within the posterior aspect of the right superior frontal gyrus and anteromedial aspect of the left cerebellar hemisphere without associated signal abnormality on other sequences. These are favored to represent late subacute infarcts. Recommend follow-up MRI in 3 months to ensure resolution. 2. 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 Electronically Signed   By: Orvan Falconer M.D.   On: 01/30/2023 11:35       Assessment & Plan Altered mental status, unspecified altered mental status type Mr. Corlett presents with findings concerning for worsening of his recent infection which MRI indicated may be in the brain.  In particular he has fallen multiple times in the last few days and on balance testing today he could not take a big step or stand with his eyes closed or even stand on 1 foot or even touch his tip of his nose reliably all concerning for possible progression of brain infection I therefore asked him to go to the ER today and get  evaluated for possible progression of brain infection and also be reevaluated for possible heart valve infection which I think is likely responsible for multiple septic emboli that he has had over the last few months use this and talk to his infectious disease specialist Dr. Daiva Eves for further recommendations at the hospital and he is not safe for discharge home due to extreme lack of balance and lives alone I believe he should be admitted for skilled nursing if he does not qualify for admission to the hospital  Septic Emboli with Possible Brain Involvement Worsening mental status and frequent falls raise concerns about potential brain infection progression. An MRI showed white spots in the brain, suggesting septic emboli, likely from a past fishhook injury and bacteremia. The differential includes ongoing infection versus residual effects. There is no fever, but a history of being cold-natured. Risks involve worsening infection and cognitive decline. Repeating the MRI can help determine if the infection is active or residual. An urgent MRI is ordered, and hospital admission is recommended if mental status deteriorates. Follow-up with Dr. Wyonia Hough on January 22 for further lab work and blood cultures is planned. Concerns about mental status and balance issues will be documented for ER evaluation, and hospital admission for skilled nursing is advised if discharge is unsafe. MSSA bacteremia  Nonintractable headache, unspecified chronicity  pattern, unspecified headache type  Frequent falls  Suspected endocarditis  At risk for unsafe behavior  Loss of balance Balance and Mobility Issues Frequent falls and significant balance issues since September are likely related to brain MRI findings. Mobility has declined despite previous improvement with skilled nursing and physical therapy. Risks include severe injury from falls. Home health services can improve safety and mobility. Home modifications and a  walker for stability are recommended. Initiate home health services, including skilled nursing and physical therapy. Safety rails and other home modifications are advised. Use of a walker is recommended, and ER evaluation is instructed if falls occur for possible inpatient rehab.  Knee Pain and Previous Infection The knee, previously suspected of infection, has improved with no current infection, though there is a history of torn cartilage. The knee is not causing pain now. Risks include potential future pain or injury. The current non-intervention approach avoids unnecessary procedures, and no immediate intervention is required.  General Health Maintenance Ongoing monitoring and management of overall health are necessary, especially considering recent infections and mobility issues. Regular follow-up with primary care and specialists is essential. Monitoring for signs of recurrent infection or new symptoms is advised.  Follow-up Attend the follow-up appointment with Dr. Wyonia Hough on January 22. An urgent repeat MRI of the brain is scheduled. Coordination with home health services for ongoing care and monitoring is planned.      Orders Placed During this Encounter:   Orders Placed This Encounter  Procedures   MR Brain W Wo Contrast    Standing Status:   Future    Expiration Date:   02/17/2024    If indicated for the ordered procedure, I authorize the administration of contrast media per Radiology protocol:   Yes    What is the patient's sedation requirement?:   No Sedation    Does the patient have a pacemaker or implanted devices?:   No    Preferred imaging location?:   [facility] (table limit-550lbs)   Ambulatory referral to Home Health    Referral Priority:   Emergency    Referral Type:   Home Health Care    Referral Reason:   Specialty Services Required    Requested Specialty:   Home Health Services    Number of Visits Requested:   1   No orders of the defined types were placed  in this encounter.  Arranging home health and MRI brain stat as backup plan if emergency room does not admit; prefer admission for workup and inpatient rehab for safety reasons (multiple falls at home, and possibly evolving encephalitis)    This document was synthesized by artificial intelligence ([employer]) using HIPAA-compliant recording of the clinical interaction;   We discussed the use of AI scribe software for clinical note transcription with the patient, who gave verbal consent to proceed.    Additional Info: This encounter employed state-of-the-art, real-time, collaborative documentation. The patient actively reviewed and assisted in updating their electronic medical record on a shared screen, ensuring transparency and facilitating joint problem-solving for the problem list, overview, and plan. This approach promotes accurate, informed care. The treatment plan was discussed and reviewed in detail, including medication safety, potential side effects, and all patient questions. We confirmed understanding and comfort with the plan. Follow-up instructions were established, including contacting the office for any concerns, returning if symptoms worsen, persist, or new symptoms develop, and precautions for potential emergency department visits.

## 2023-02-17 NOTE — Patient Instructions (Addendum)
VISIT SUMMARY:  During today's visit, we discussed your recent recurrent falls, worsening mental status, and balance issues. We reviewed your MRI findings, which showed white spots in the brain, possibly indicating septic emboli. We also addressed your knee pain and previous infection, as well as your overall health maintenance.  YOUR PLAN:  -SEPTIC EMBOLI WITH POSSIBLE BRAIN INVOLVEMENT: Septic emboli are infections that have traveled to the brain, potentially causing white spots seen on your MRI. We are concerned about the progression of this infection, especially given your worsening mental status and frequent falls. We have ordered an urgent repeat MRI to determine if the infection is still active. If your mental status worsens, you may need to be admitted to the hospital. Please follow up with Dr. Wyonia Hough on January 22 for further lab work and blood cultures.  -BALANCE AND MOBILITY ISSUES: Your frequent falls and balance issues are likely related to the findings on your brain MRI. To improve your safety and mobility at home, we recommend initiating home health services, including skilled nursing and physical therapy. Additionally, home modifications such as safety rails and the use of a walker are advised. If you experience any falls, please go to the ER for evaluation and possible inpatient rehabilitation.  -KNEE PAIN AND PREVIOUS INFECTION: Your knee, which was previously infected, has shown improvement and is not currently causing pain. We will continue to monitor it, but no immediate intervention is needed at this time.  -GENERAL HEALTH MAINTENANCE: It is important to continue monitoring your overall health, especially given your recent infections and mobility issues. Regular follow-ups with your primary care doctor and specialists are essential. Please watch for any signs of recurrent infection or new symptoms.  INSTRUCTIONS: Please go to emergency room today for evaluation for readmission  for possible brain infection, due to worsening falls and balance. Please attend your follow-up appointment with Dr. Wyonia Hough on January 22. An urgent repeat MRI of your brain has been scheduled. We will also coordinate with home health services for your ongoing care and monitoring.

## 2023-02-17 NOTE — ED Triage Notes (Signed)
Pt via POV after his PCP sent him here to rule out sepsis. Pt has had multiple falls and weakness, and his PCP suggested he be evaluated for possible infection and then referred for rehab/SNF. Pt reports 5/10 headache and right elbow in triage.

## 2023-02-17 NOTE — ED Notes (Signed)
Patient transported to MRI 

## 2023-02-17 NOTE — Discharge Instructions (Signed)
We evaluated you for your weakness.  Your testing including MRI of your brain was reassuring.  Your laboratory testing did show some mild dehydration and we gave you IV fluids.  Please be sure to drink lots of fluids and follow-up with your primary doctor for recheck of your kidney function.  We discussed staying in the emergency department for physical therapy evaluation, but you preferred to go home and try home health.  You can always return to the emergency department as needed.  Please also return if you develop any new or worsening symptoms such as fevers, chills, chest pain, shortness of breath, nausea, vomiting, lightheadedness or dizziness, abdominal pain, joint swelling, or any other new symptoms.

## 2023-02-17 NOTE — ED Notes (Signed)
Patient ambulated to the wheelchair with little difficulty; family to help patient at home.

## 2023-02-17 NOTE — ED Notes (Signed)
Pt ambulated to the bathroom with very unsteady gait

## 2023-02-17 NOTE — ED Provider Notes (Signed)
[place] EMERGENCY DEPARTMENT AT [facility] Provider Note  CSN: [id number] Arrival date & time: 02/17/23 1329  Chief Complaint(s) Fall and Weakness  HPI Michael Bender is a 78 y.o. male history of COPD, hypertension, hyperlipidemia, complicated recent history with MSSA bacteremia complicated by septic arthritis of multiple sites presenting to the emergency department for frequent falls.  Patient was doing overall well, on chronic cefadroxil therapy which was discontinued 2 weeks ago.  In the last few days to 1 week the patient has had multiple falls.  Also having generalized weakness.  He reports that he has been having dizziness, worse when standing.  No fevers or chills.  No chest pain.  No shortness of breath.  No cough, no lightheadedness or dizziness.  Reports mild headache and some elbow pain.  No knee pain wrist pain.  Saw his primary doctor today who was concerned and referred him to the emergency department   Past Medical History Past Medical History:  Diagnosis Date   Arthritis    Back   Blood transfusion    as a child   Chronic back pain    COPD (chronic obstructive pulmonary disease) (HCC)    Disturbance of skin sensation 05/22/2018   Effusion of right knee joint 01/16/2023   Essential hypertension 03/05/2007   Qualifier: Diagnosis of   By: Tawanna Cooler MD, Tinnie Gens A      Gastric erosion    Gastritis and gastroduodenitis    Gastrointestinal hemorrhage with melena 11/20/2018   GIB (gastrointestinal bleeding) 07/18/2019   Headache(784.0)    last one 6 months ago   Hemorrhoids, internal    History of duodenal ulcer    History of lumbar fusion 03/03/2022   RE-OPERATIVE DIAGNOSIS:  lumbar stenosis synovial cyst lumbar spondylosis spondylolisthesis lumbar radiculoapthy L4/5   PROCEDURE:  Procedure(s): POSTERIOR LUMBAR FUSION 1 LEVEL with resection of synovial cyst     History of upper gastrointestinal bleeding 03/03/2022   Duodenal ulcer 2021 Dr. [name]   HLD  (hyperlipidemia)    Hypertension    Loss of weight    Wt Readings from Last 10 Encounters:  04/05/22  146 lb (66.2 kg)  03/03/22  143 lb 9.6 oz (65.1 kg)  07/14/21  153 lb (69.4 kg)  12/14/20  147 lb 12.8 oz (67 kg)  11/13/19  154 lb (69.9 kg)  09/16/19  146 lb (66.2 kg)  08/06/19  146 lb (66.2 kg)  07/19/19  144 lb 13.5 oz (65.7 kg)  07/17/19  148 lb (67.1 kg)  07/09/19  147 lb 9.6 oz (67 kg)         Neck rigidity    post cervical fusion   Nocturia    PAIN, CHRONIC NEC 10/06/2006   Qualifier: Diagnosis of   By: Tawanna Cooler RN, Alvino Chapel       Pneumonia    Prostate disease    S/P cervical spinal fusion 03/03/2022   With persistent cervical pain and palpable screws Led to disability History of attempt to dig furrow in skull to fix it Last surgery 1992   Patient Active Problem List   Diagnosis Date Noted   Frequent falls 02/17/2023   Nonintractable headache 02/17/2023   Memory loss 01/19/2023   Effusion of right knee joint 01/16/2023   Cervical discitis 11/10/2022   Hardware complicating wound infection (HCC) 11/10/2022   Staphylococcal arthritis of right knee (HCC) 11/08/2022   Neck pain 11/08/2022   Finger pain, left 11/08/2022   Septic arthritis of wrist, left (HCC) 11/08/2022  MSSA bacteremia 11/07/2022   Septic infrapatellar bursitis of right knee 11/07/2022   Staphylococcal arthritis of left wrist (HCC) 11/07/2022   Infected blister of left index finger 11/07/2022   Syncope 11/05/2022   AKI (acute kidney injury) (HCC) 10/28/2022   Cachexia (HCC) 05/03/2022   Underweight on examination 05/03/2022   Chronic low back pain with bilateral sciatica 05/03/2022   Gynecomastia, male 04/05/2022   History of fusion of cervical spine 03/03/2022   History of lumbar fusion 03/03/2022   High risk medication use 03/03/2022   Intractable pain 03/03/2022   Chronic narcotic dependence (HCC) 12/14/2020   Atherosclerosis of aorta (HCC) 12/14/2020   Duodenal stenosis    HLD (hyperlipidemia)  07/18/2019   COPD (chronic obstructive pulmonary disease) (HCC) 07/18/2019   Iron deficiency anemia    History of CVA (cerebrovascular accident) 02/18/2019   Senile ecchymosis 01/21/2019   PAD (peripheral artery disease) (HCC) 12/11/2018   Insomnia 11/20/2018   Right sided temporal headache 05/22/2018   Esophageal dysphagia 07/15/2011   Bilateral leg pain 05/03/2010   DISC DISEASE, LUMBAR 01/01/2009   CONSTIPATION, CHRONIC 10/07/2008   GERD 04/04/2007   Pulmonary emphysema (HCC) 03/26/2007   Home Medication(s) Prior to Admission medications   Medication Sig Start Date End Date Taking? Authorizing Provider  amitriptyline (ELAVIL) 50 MG tablet Take 50 mg by mouth in the morning, at noon, and at bedtime. 11/04/22  Yes [provider]  amLODipine (NORVASC) 10 MG tablet Take 1 tablet (10 mg total) by mouth daily. 11/03/22  Yes Glade Lloyd, MD  cyclobenzaprine (FLEXERIL) 10 MG tablet Take 10 mg by mouth 3 (three) times daily as needed. 10/18/22  Yes [provider]  metoprolol tartrate (LOPRESSOR) 25 MG tablet Take 1 tablet (25 mg total) by mouth 2 (two) times daily. 11/14/22  Yes Marinda Elk, MD  Oxycodone HCl 10 MG TABS Take 1 tablet (10 mg total) by mouth every 8 (eight) hours as needed. Patient taking differently: Take 10 mg by mouth every 8 (eight) hours. 01/19/23  Yes Lula Olszewski, MD  pantoprazole (PROTONIX) 40 MG tablet TAKE 1 TABLET BY MOUTH EVERY DAY BEFORE BREAKFAST 01/20/23  Yes Lula Olszewski, MD  polyethylene glycol (MIRALAX / GLYCOLAX) 17 g packet Take 17 g by mouth daily as needed for moderate constipation. 11/02/22  Yes Glade Lloyd, MD  potassium chloride SA (KLOR-CON M) 20 MEQ tablet Take 1 tablet (20 mEq total) by mouth 2 (two) times daily. 11/02/22  Yes Glade Lloyd, MD  rosuvastatin (CRESTOR) 40 MG tablet Take 40 mg by mouth daily. 11/04/22  Yes [provider]  SUMAtriptan (IMITREX) 50 MG tablet TAKE 1 TABLET (50 MG TOTAL) BY  MOUTH DAILY. MAY REPEAT IN 2 HOURS IF HEADACHE PERSISTS OR RECURS. 11/18/22  Yes Lula Olszewski, MD  cefadroxil (DURICEF) 500 MG capsule Take 2 capsules (1,000 mg total) by mouth 2 (two) times daily. Patient not taking: Reported on 02/17/2023 01/17/23   Daiva Eves, Lisette Grinder, MD  Past Surgical History Past Surgical History:  Procedure Laterality Date   BIOPSY  07/19/2019   Procedure: BIOPSY;  Surgeon: Shellia Cleverly, DO;  Location: [facility];  Service: Gastroenterology;;   CERVICAL FUSION  1992   C2/C 3  four surgeries   COLONOSCOPY     ESOPHAGOGASTRODUODENOSCOPY  07/20/2011   Procedure: ESOPHAGOGASTRODUODENOSCOPY (EGD);  Surgeon: [name], MD;  Location: [facility];  Service: Endoscopy;  Laterality: N/A;   ESOPHAGOGASTRODUODENOSCOPY (EGD) WITH PROPOFOL N/A 07/19/2019   Procedure: ESOPHAGOGASTRODUODENOSCOPY (EGD) WITH PROPOFOL;  Surgeon: Shellia Cleverly, DO;  Location: [facility];  Service: Gastroenterology;  Laterality: N/A;   I & D EXTREMITY Left 11/09/2022   Procedure: IRRIGATION AND DEBRIDEMENT LEFT WRIST;  Surgeon: Samuella Cota, MD;  Location: [facility] OR;  Service: Orthopedics;  Laterality: Left;   SAVORY DILATION  07/20/2011   Procedure: SAVORY DILATION;  Surgeon: [name], MD;  Location: [facility];  Service: Endoscopy;  Laterality: N/A;   SPINAL FUSION  05/06/11   Family History Family History  Problem Relation Age of Onset   Heart disease Mother    Heart attack Mother 81   Pancreatic cancer Father 29   Pancreatic cancer Brother    Anesthesia problems Neg Hx    Colon cancer Neg Hx    Liver cancer Neg Hx    Stomach cancer Neg Hx    Esophageal cancer Neg Hx    Rectal cancer Neg Hx     Social History Social History   Tobacco Use   Smoking status: Some Days    Current packs/day: 0.00    Types: Cigarettes    Start date:  12/12/1978    Last attempt to quit: 12/12/2018    Years since quitting: 4.1   Smokeless tobacco: Never  Vaping Use   Vaping status: Never Used  Substance Use Topics   Alcohol use: Not Currently    Alcohol/week: 2.0 standard drinks of alcohol    Types: 2 Shots of liquor per week   Drug use: No   Allergies Nubain [nalbuphine hcl]  Review of Systems Review of Systems  All other systems reviewed and are negative.   Physical Exam Vital Signs  I have reviewed the triage vital signs BP (!) 194/67 (BP Location: Right Arm)   Pulse 82   Temp 98.4 F (36.9 C) (Oral)   Resp 13   Ht 5\' 10"  (1.778 m)   Wt 66.2 kg   SpO2 98%   BMI 20.95 kg/m  Physical Exam Vitals and nursing note reviewed.  Constitutional:      General: He is not in acute distress.    Appearance: Normal appearance.  HENT:     Mouth/Throat:     Mouth: Mucous membranes are dry.  Eyes:     Conjunctiva/sclera: Conjunctivae normal.  Cardiovascular:     Rate and Rhythm: Normal rate and regular rhythm.  Pulmonary:     Effort: Pulmonary effort is normal. No respiratory distress.     Breath sounds: Normal breath sounds.  Abdominal:     General: Abdomen is flat.     Palpations: Abdomen is soft.     Tenderness: There is no abdominal tenderness.  Musculoskeletal:     Cervical back: Neck supple.     Right lower leg: No edema.     Left lower leg: No edema.     Comments: Able to range all 4 extremities all joints without any focal tenderness or deformity, swelling or warmth.  Skin:    General:  Skin is warm and dry.     Capillary Refill: Capillary refill takes less than 2 seconds.     Comments: Scattered abrasions, including to the left superior thoracic back, bilateral elbows  Neurological:     Mental Status: He is alert and oriented to person, place, and time. Mental status is at baseline.     Comments: Follows commands, no cranial nerve deficit, strength 5 out of 5 in the bilateral upper and lower extremities, no  dysmetria but patient seems to have difficulty following multistep commands.  No sensory deficit  Psychiatric:        Mood and Affect: Mood normal.        Behavior: Behavior normal.     ED Results and Treatments Labs (all labs ordered are listed, but only abnormal results are displayed) Labs Reviewed  URINALYSIS, ROUTINE W REFLEX MICROSCOPIC - Abnormal; Notable for the following components:      Result Value   Hgb urine dipstick MODERATE (*)    Protein, ur 30 (*)    All other components within normal limits  CBC WITH DIFFERENTIAL/PLATELET - Abnormal; Notable for the following components:   RBC 2.85 (*)    Hemoglobin 8.9 (*)    HCT 27.3 (*)    Platelets 141 (*)    All other components within normal limits  COMPREHENSIVE METABOLIC PANEL - Abnormal; Notable for the following components:   Glucose, Bld 188 (*)    BUN 26 (*)    Creatinine, Ser 2.10 (*)    Calcium 8.5 (*)    Total Protein 6.3 (*)    Albumin 3.1 (*)    GFR, Estimated 32 (*)    All other components within normal limits  SEDIMENTATION RATE - Abnormal; Notable for the following components:   Sed Rate 18 (*)    All other components within normal limits  CBG MONITORING, ED - Abnormal; Notable for the following components:   Glucose-Capillary 164 (*)    All other components within normal limits  C-REACTIVE PROTEIN                                                                                                                          Radiology MR Brain W and Wo Contrast Result Date: 02/17/2023 CLINICAL DATA:  Altered mental status EXAM: MRI HEAD WITHOUT AND WITH CONTRAST TECHNIQUE: Multiplanar, multiecho pulse sequences of the brain and surrounding structures were obtained without and with intravenous contrast. CONTRAST:  7mL GADAVIST GADOBUTROL 1 MMOL/ML IV SOLN COMPARISON:  None Available. FINDINGS: Brain: No acute infarct, mass effect or extra-axial collection. Multiple chronic microhemorrhages of the cerebellum and  occipital lobes. There is multifocal hyperintense T2-weighted signal within the white matter. Generalized volume loss. 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 The midline structures are normal. There is no abnormal contrast enhancement. Vascular: Normal flow voids. Skull and upper cervical spine: Normal calvarium and skull base. Visualized upper cervical spine and soft tissues are normal. Sinuses/Orbits:No paranasal sinus  fluid levels or advanced mucosal thickening. No mastoid or middle ear effusion. Normal orbits. IMPRESSION: 1. No acute intracranial abnormality. 2. Multiple chronic microhemorrhages of the cerebellum and occipital lobes. 3. 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 Electronically Signed   By: Deatra Robinson M.D.   On: 02/17/2023 22:58   CT Head Wo Contrast Result Date: 02/17/2023 CLINICAL DATA:  Multiple falls and weakness, head and neck trauma EXAM: CT HEAD WITHOUT CONTRAST CT CERVICAL SPINE WITHOUT CONTRAST TECHNIQUE: Multidetector CT imaging of the head and cervical spine was performed following the standard protocol without intravenous contrast. Multiplanar CT image reconstructions of the cervical spine were also generated. RADIATION DOSE REDUCTION: This exam was performed according to the departmental dose-optimization program which includes automated exposure control, adjustment of the mA and/or kV according to patient size and/or use of iterative reconstruction technique. COMPARISON:  11/05/2022 CT head, 06/23/2009 CT cervical spine FINDINGS: CT HEAD FINDINGS Brain: No evidence of acute infarct, hemorrhage, mass, mass effect, or midline shift. No hydrocephalus or extra-axial fluid collection. Periventricular white matter changes, likely the sequela of chronic small vessel ischemic disease. Age related cerebral atrophy. Remote lacunar infarct in the left basal ganglia. Vascular: No hyperdense vessel.  Atherosclerotic calcifications in the intracranial carotid and vertebral arteries. Skull: Negative for fracture or focal lesion. Sinuses/Orbits: Persistent opacification of the right sphenoid sinus with osseous thickening, consistent with chronic sinusitis. Status post bilateral lens replacements. Other: The mastoid air cells are well aerated. CT CERVICAL SPINE FINDINGS Alignment: No traumatic listhesis. Trace anterolisthesis of C7 on T1, which appears degenerative. Skull base and vertebrae: No acute fracture or suspicious osseous lesion. Posterior fusion C3-C4. Soft tissues and spinal canal: No prevertebral fluid or swelling. No visible canal hematoma. Disc levels: Degenerative changes in the cervical spine.No high-grade spinal canal stenosis. Upper chest: No focal pulmonary opacity or pleural effusion. Emphysema. IMPRESSION: 1. No acute intracranial process. 2. No acute fracture or traumatic listhesis in the cervical spine. Electronically Signed   By: Wiliam Ke M.D.   On: 02/17/2023 17:16   CT Cervical Spine Wo Contrast Result Date: 02/17/2023 CLINICAL DATA:  Multiple falls and weakness, head and neck trauma EXAM: CT HEAD WITHOUT CONTRAST CT CERVICAL SPINE WITHOUT CONTRAST TECHNIQUE: Multidetector CT imaging of the head and cervical spine was performed following the standard protocol without intravenous contrast. Multiplanar CT image reconstructions of the cervical spine were also generated. RADIATION DOSE REDUCTION: This exam was performed according to the departmental dose-optimization program which includes automated exposure control, adjustment of the mA and/or kV according to patient size and/or use of iterative reconstruction technique. COMPARISON:  11/05/2022 CT head, 06/23/2009 CT cervical spine FINDINGS: CT HEAD FINDINGS Brain: No evidence of acute infarct, hemorrhage, mass, mass effect, or midline shift. No hydrocephalus or extra-axial fluid collection. Periventricular white matter changes, likely  the sequela of chronic small vessel ischemic disease. Age related cerebral atrophy. Remote lacunar infarct in the left basal ganglia. Vascular: No hyperdense vessel. Atherosclerotic calcifications in the intracranial carotid and vertebral arteries. Skull: Negative for fracture or focal lesion. Sinuses/Orbits: Persistent opacification of the right sphenoid sinus with osseous thickening, consistent with chronic sinusitis. Status post bilateral lens replacements. Other: The mastoid air cells are well aerated. CT CERVICAL SPINE FINDINGS Alignment: No traumatic listhesis. Trace anterolisthesis of C7 on T1, which appears degenerative. Skull base and vertebrae: No acute fracture or suspicious osseous lesion. Posterior fusion C3-C4. Soft tissues and spinal canal: No prevertebral fluid or  swelling. No visible canal hematoma. Disc levels: Degenerative changes in the cervical spine.No high-grade spinal canal stenosis. Upper chest: No focal pulmonary opacity or pleural effusion. Emphysema. IMPRESSION: 1. No acute intracranial process. 2. No acute fracture or traumatic listhesis in the cervical spine. Electronically Signed   By: Wiliam Ke M.D.   On: 02/17/2023 17:16   DG Elbow 2 Views Right Result Date: 02/17/2023 CLINICAL DATA:  Fall.  Elbow pain. EXAM: RIGHT ELBOW - 2 VIEW COMPARISON:  None Available. FINDINGS: No acute fracture or dislocation. No aggressive osseous lesion. Mild degenerative changes of elbow joint. Olecranon enthesophyte noted. No radiopaque foreign bodies. Soft tissues are within normal limits. IMPRESSION: *No acute osseous abnormality of the right elbow. Electronically Signed   By: Jules Schick M.D.   On: 02/17/2023 16:35    Pertinent labs & imaging results that were available during my care of the patient were reviewed by me and considered in my medical decision making (see MDM for details).  Medications Ordered in ED Medications  sodium chloride 0.9 % bolus 1,000 mL (0 mLs Intravenous  Stopped 02/17/23 2111)  gadobutrol (GADAVIST) 1 MMOL/ML injection 7 mL (7 mLs Intravenous Contrast Given 02/17/23 2127)                                                                                                                                     Procedures Procedures  (including critical care time)  Medical Decision Making / ED Course   MDM:  78 year old male presenting with frequent falls, dizziness.  Patient overall well-appearing, no fever in the emergency department, no tachycardia.  His dizziness and falls seem worse right after standing, labs do show mild AKI, this could be a contributing factor.  He does have a very complicated history of MSSA bacteremia and recently stopped his cefadroxil, no obvious signs of infection on exam, but will check ESR and CRP as these were being trended in the infectious disease clinic.  He did have a relatively recent MRI which showed possible subacute stroke, we will repeat this to see whether there is any new findings or any evidence of septic emboli.  Low concern for meningitis, neck supple, no fever.  Will reassess.  Clinical Course as of 02/17/23 2323  Caleen Essex Feb 17, 2023  2319 MRI brain was obtained.  No evidence of any intracranial process such as septic emboli, stroke.  Discussed results with the patient and his son including mild dehydration with slight elevated creatinine.  He did receive IV fluids.  His ESR is downtrending and his CRP is negative.  No clear evidence of any acute medical issue.  Does not seem that there is any acute need for hospitalization at this time, offered observation in the emergency department with physical therapy evaluation, but the patient and his son prefer to go home.  His primary doctor has already ordered home health services including physical therapy.  Discussed that patient  can return to the emergency department at any time.  Advised also close follow-up with his infectious disease specialist. Will discharge  patient to home. All questions answered. Patient comfortable with plan of discharge. Return precautions discussed with patient and specified on the after visit summary.  [WS]    Clinical Course User Index [WS] Suezanne Jacquet, Jerilee Field, MD     Additional history obtained: -Additional history obtained from family -External records from outside source obtained and reviewed including: Chart review including previous notes, labs, imaging, consultation notes including prior PMD and ID notes    Lab Tests: -I ordered, reviewed, and interpreted labs.   The pertinent results include:   Labs Reviewed  URINALYSIS, ROUTINE W REFLEX MICROSCOPIC - Abnormal; Notable for the following components:      Result Value   Hgb urine dipstick MODERATE (*)    Protein, ur 30 (*)    All other components within normal limits  CBC WITH DIFFERENTIAL/PLATELET - Abnormal; Notable for the following components:   RBC 2.85 (*)    Hemoglobin 8.9 (*)    HCT 27.3 (*)    Platelets 141 (*)    All other components within normal limits  COMPREHENSIVE METABOLIC PANEL - Abnormal; Notable for the following components:   Glucose, Bld 188 (*)    BUN 26 (*)    Creatinine, Ser 2.10 (*)    Calcium 8.5 (*)    Total Protein 6.3 (*)    Albumin 3.1 (*)    GFR, Estimated 32 (*)    All other components within normal limits  SEDIMENTATION RATE - Abnormal; Notable for the following components:   Sed Rate 18 (*)    All other components within normal limits  CBG MONITORING, ED - Abnormal; Notable for the following components:   Glucose-Capillary 164 (*)    All other components within normal limits  C-REACTIVE PROTEIN    Notable for chronic anemia, mild AKI  EKG   EKG Interpretation Date/Time:  Friday February 17 2023 14:17:13 EST Ventricular Rate:  80 PR Interval:  182 QRS Duration:  96 QT Interval:  430 QTC Calculation: 495 R Axis:   80  Text Interpretation: Normal sinus rhythm Prolonged QT Abnormal ECG Confirmed by  Alvino Blood ([id number]) on 02/17/2023 6:24:26 PM         Imaging Studies ordered: I ordered imaging studies including MRI brain, CXR, CT head  On my interpretation imaging demonstrates no acute process I independently visualized and interpreted imaging. I agree with the radiologist interpretation   Medicines ordered and prescription drug management: Meds ordered this encounter  Medications   sodium chloride 0.9 % bolus 1,000 mL   gadobutrol (GADAVIST) 1 MMOL/ML injection 7 mL    -I have reviewed the patients home medicines and have made adjustments as needed    Cardiac Monitoring: The patient was maintained on a cardiac monitor.  I personally viewed and interpreted the cardiac monitored which showed an underlying rhythm of: NSR  Reevaluation: After the interventions noted above, I reevaluated the patient and found that their symptoms have improved  Co morbidities that complicate the patient evaluation  Past Medical History:  Diagnosis Date   Arthritis    Back   Blood transfusion    as a child   Chronic back pain    COPD (chronic obstructive pulmonary disease) (HCC)    Disturbance of skin sensation 05/22/2018   Effusion of right knee joint 01/16/2023   Essential hypertension 03/05/2007   Qualifier: Diagnosis of   By:  Tawanna Cooler MD, Tinnie Gens A      Gastric erosion    Gastritis and gastroduodenitis    Gastrointestinal hemorrhage with melena 11/20/2018   GIB (gastrointestinal bleeding) 07/18/2019   Headache(784.0)    last one 6 months ago   Hemorrhoids, internal    History of duodenal ulcer    History of lumbar fusion 03/03/2022   RE-OPERATIVE DIAGNOSIS:  lumbar stenosis synovial cyst lumbar spondylosis spondylolisthesis lumbar radiculoapthy L4/5   PROCEDURE:  Procedure(s): POSTERIOR LUMBAR FUSION 1 LEVEL with resection of synovial cyst     History of upper gastrointestinal bleeding 03/03/2022   Duodenal ulcer 2021 Dr. [name]   HLD (hyperlipidemia)    Hypertension     Loss of weight    Wt Readings from Last 10 Encounters:  04/05/22  146 lb (66.2 kg)  03/03/22  143 lb 9.6 oz (65.1 kg)  07/14/21  153 lb (69.4 kg)  12/14/20  147 lb 12.8 oz (67 kg)  11/13/19  154 lb (69.9 kg)  09/16/19  146 lb (66.2 kg)  08/06/19  146 lb (66.2 kg)  07/19/19  144 lb 13.5 oz (65.7 kg)  07/17/19  148 lb (67.1 kg)  07/09/19  147 lb 9.6 oz (67 kg)         Neck rigidity    post cervical fusion   Nocturia    PAIN, CHRONIC NEC 10/06/2006   Qualifier: Diagnosis of   By: Tawanna Cooler RN, Alvino Chapel       Pneumonia    Prostate disease    S/P cervical spinal fusion 03/03/2022   With persistent cervical pain and palpable screws Led to disability History of attempt to dig furrow in skull to fix it Last surgery 1992      Dispostion: Disposition decision including need for hospitalization was considered, and patient discharged from emergency department.    Final Clinical Impression(s) / ED Diagnoses Final diagnoses:  Weakness  Dehydration     This chart was dictated using voice recognition software.  Despite best efforts to proofread,  errors can occur which can change the documentation meaning.    Lonell Grandchild, MD 02/17/23 [phone]

## 2023-02-17 NOTE — ED Provider Triage Note (Signed)
Emergency Medicine Provider Triage Evaluation Note  Michael Bender , a 78 y.o. male  was evaluated in triage.  Pt complains of AMS.  Has complex medical history complaining of recurrent falls at home.  Family reports in September he was brought into emergency room for concern of septic emboli and endocarditis.  Patient was sent here for possible admission with concern of repeat brain infection.  Report following Tuesday and Wednesday.  Falls were unwitnessed.  Patient is only complaining of right elbow pain.  No obvious deformity or swelling over this area.  Review of Systems  Positive: Falls, generalized weakness Negative: Focal weakness  Physical Exam  BP 122/78 (BP Location: Right Arm)   Pulse 83   Temp 97.7 F (36.5 C)   Resp 18   Ht 5\' 10"  (1.778 m)   Wt 66.2 kg   SpO2 98%   BMI 20.95 kg/m  Gen:   Awake, no distress  Resp:  Normal effort  MSK:   Moves extremities without difficulty  Other:  No neurological deficits on exam  Medical Decision Making  Medically screening exam initiated at 2:50 PM.  Appropriate orders placed.  Michael Bender was informed that the remainder of the evaluation will be completed by another provider, this initial triage assessment does not replace that evaluation, and the importance of remaining in the ED until their evaluation is complete.  Will place orders and obtain imaging.  Patient is not currently on antibiotics.   Smitty Knudsen, PA-C 02/17/23 1501

## 2023-02-21 ENCOUNTER — Telehealth: Payer: Self-pay

## 2023-02-21 ENCOUNTER — Encounter: Payer: Self-pay | Admitting: Infectious Disease

## 2023-02-21 DIAGNOSIS — R2689 Other abnormalities of gait and mobility: Secondary | ICD-10-CM | POA: Insufficient documentation

## 2023-02-21 DIAGNOSIS — M25461 Effusion, right knee: Secondary | ICD-10-CM

## 2023-02-21 HISTORY — DX: Other abnormalities of gait and mobility: R26.89

## 2023-02-21 HISTORY — DX: Effusion, right knee: M25.461

## 2023-02-21 NOTE — Transitions of Care (Post Inpatient/ED Visit) (Signed)
   02/21/2023  Name: JERIS VERRILL MRN: [id number] DOB: 10-Oct-1945  Today's TOC FU Call Status: Today's TOC FU Call Status:: Successful TOC FU Call Completed TOC FU Call Complete Date: 02/21/23 Patient's Name and Date of Birth confirmed.  Transition Care Management Follow-up Telephone Call Date of Discharge: 02/17/23 Discharge Facility: [facility]) Type of Discharge: Emergency Department Reason for ED Visit: Other: (weakness) How have you been since you were released from the hospital?: Same Any questions or concerns?: Yes  Items Reviewed: Did you receive and understand the discharge instructions provided?: Yes Medications obtained,verified, and reconciled?: Yes (Medications Reviewed) Any new allergies since your discharge?: No Dietary orders reviewed?: Yes Do you have support at home?: Yes People in Home: child(ren), adult  Medications Reviewed Today: Medications Reviewed Today   Medications were not reviewed in this encounter     Home Care and Equipment/Supplies: Were Home Health Services Ordered?: NA Any new equipment or medical supplies ordered?: NA  Functional Questionnaire: Do you need assistance with bathing/showering or dressing?: No Do you need assistance with meal preparation?: No Do you need assistance with eating?: No Do you have difficulty maintaining continence: No Do you need assistance with getting out of bed/getting out of a chair/moving?: No Do you have difficulty managing or taking your medications?: Yes  Follow up appointments reviewed: PCP Follow-up appointment confirmed?: NA MD Provider Line Number:[phone] Given: No Specialist Hospital Follow-up appointment confirmed?: NA Do you need transportation to your follow-up appointment?: No Do you understand care options if your condition(s) worsen?: Yes-patient verbalized understanding    SIGNATURE Karena Addison, LPN [facility] Nurse Health Advisor Direct Dial [phone]

## 2023-02-21 NOTE — Progress Notes (Unsigned)
Subjective:   Chief Complaint: followup for disseminated MSSA infection with bacteremia, left septic wrist sp I and D, ? 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   MRN: [id number]  HPI  78  y.o. male with history of cervical lumbar spine hardware and recent admission for falls weakness and rhabdomyolysis who was discharged to skilled nursing facility but then brought back with sepsis due to MSSA bacteremia.  He had fallen but also headaches was at pain in his left wrist as well as pain in his right knee and index finger.   He has been proven to have a left septic wrist and now status post I&D.     MRI of the hand showed multifocal osteoarthritic changes throughout the second hand most advanced at the second MCP joint with nonspecific marrow edema enhancement around the second metacarpal phalangeal joint with surrounding soft tissue swelling concerning for inflammatory arthropathy versus septic arthritis   MRI of the C-spine did show some mild reactive endplate changes and marrow edema from C5-C6 which could be degenerative or could be early osteomyelitis discitis.   Orthopedic hand surgery followed him closely in the hospital and orthopedics also evaluated his knee several times and did not feel it was infected.   I had asked them to see him and talk to Dr. Sherrie Bender.  My concern was that while the patient never grew an organism from his knee and while his cell count in the knee at 8500 with 94% neutrophils while NOT typically high value of 50-100K it was NOT normal and we have not alternative explanation for why he had a joint effusion and we knew that he had MSSA bacteremia and we also knew that he had been on antibiotics for several days prior to the knee having been tapped.    Interim history:   "[facility] receiving cefazolin every 8  hours.   At last visit he had been doing relatively well with the exception of the right knee which continues to bother him that fluid has built up and he also was having reduced range of motion he has pain when he puts weight on it and sometimes if he has vigorous physical therapy the pain goes on for hours afterwards. His inflammatory markers were also markedly elevated.  We obtained urgent follow-up with [facility] the following day.  They reportedly were still not thinking that he had evidence for infection in the knee but did make a follow-up appointment for him  Have the report from that visit with Dr. Sherrie Bender see below      Michael Bender The patient and his son (1 of 2 who accompany him from time to time) are voicing desire for 2nd opinion and I awas  frankly frustrated that it has been more than 2 months if not longer. ]  Attempts were made to arrange for 2nd opionion from orthopedic surgery group in town.  He had been having issues with memory and his PCP Michael Hew, MD ordered MRI brain which ws done in late December and showed   IMPRESSION: 1. Faint, punctate foci of enhancement within the posterior aspect of the right superior frontal gyrus and anteromedial aspect of the left cerebellar hemisphere without associated signal abnormality on other sequences. These are favored to represent late subacute infarcts. Recommend follow-up MRI in 3 months  to ensure resolution. 2. 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   I was concerned that these might have been due to undiagnosed lett sided endocarditis with septic emboli to brain and that there might be potential for abscesses to develop --since he was not on CNS penetrating antibiotics. However no abscesses were seen.  I had him continue cefadroxil and followup with my partner  Dr. Gevena Bender that time Orthopedics had seen the patient and felt the knee was not infected. My partner Dr. Renold Bender elected, as I had been considering, stopping the cefadroxil to see how his knee did clinically off of the antibiotics. He has unfortunately since then had multiple falls and was seen by his PCP on 02/17/2023 who was concerned the patient might be developiong sepsisIn the  ER where he had CT brain, C spine  MRI of the brain showed  IMPRESSION: 1. No acute intracranial abnormality. 2. Multiple chronic microhemorrhages of the cerebellum and occipital lobes. 3. 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       Past Medical History:  Diagnosis Date   Arthritis    Back   Blood transfusion    as a child   Chronic back pain    COPD (chronic obstructive pulmonary disease) (HCC)    Disturbance of skin sensation 05/22/2018   Effusion of right knee joint 01/16/2023   Essential hypertension 03/05/2007   Qualifier: Diagnosis of   By: Michael Cooler MD, Eugenio Hoes      Gastric erosion    Gastritis and gastroduodenitis    Gastrointestinal hemorrhage with melena 11/20/2018   GIB (gastrointestinal bleeding) 07/18/2019   Headache(784.0)    last one 6 months ago   Hemorrhoids, internal    History of duodenal ulcer    History of lumbar fusion 03/03/2022   RE-OPERATIVE DIAGNOSIS:  lumbar stenosis synovial cyst lumbar spondylosis spondylolisthesis lumbar radiculoapthy L4/5   PROCEDURE:  Procedure(s): POSTERIOR LUMBAR FUSION 1 LEVEL with resection of synovial cyst     History of upper gastrointestinal bleeding 03/03/2022   Duodenal ulcer 2021 Dr. Leone Bender   HLD (hyperlipidemia)    Hypertension    Loss of weight    Wt Readings from Last 10 Encounters:  04/05/22  146 lb (66.2 kg)  03/03/22  143 lb 9.6 oz (65.1 kg)  07/14/21  153 lb (69.4 kg)  12/14/20  147 lb 12.8 oz (67 kg)  11/13/19  154 lb (69.9 kg)  09/16/19  146 lb (66.2 kg)  08/06/19   146 lb (66.2 kg)  07/19/19  144 lb 13.5 oz (65.7 kg)  07/17/19  148 lb (67.1 kg)  07/09/19  147 lb 9.6 oz (67 kg)         Neck rigidity    post cervical fusion   Nocturia    PAIN, CHRONIC NEC 10/06/2006   Qualifier: Diagnosis of   By: Michael Cooler RN, Alvino Chapel       Pneumonia    Prostate disease    S/P cervical spinal fusion 03/03/2022   With persistent cervical pain and palpable screws Led to disability History of attempt to dig furrow in skull to fix it Last surgery 1992    Past Surgical History:  Procedure Laterality Date   BIOPSY  07/19/2019   Procedure: BIOPSY;  Surgeon: Shellia Cleverly, DO;  Location: [facility];  Service: Gastroenterology;;  CERVICAL FUSION  1992   C2/C 3  four surgeries   COLONOSCOPY     ESOPHAGOGASTRODUODENOSCOPY  07/20/2011   Procedure: ESOPHAGOGASTRODUODENOSCOPY (EGD);  Surgeon: [name], MD;  Location: [facility];  Service: Endoscopy;  Laterality: N/A;   ESOPHAGOGASTRODUODENOSCOPY (EGD) WITH PROPOFOL N/A 07/19/2019   Procedure: ESOPHAGOGASTRODUODENOSCOPY (EGD) WITH PROPOFOL;  Surgeon: Shellia Cleverly, DO;  Location: [facility];  Service: Gastroenterology;  Laterality: N/A;   I & D EXTREMITY Left 11/09/2022   Procedure: IRRIGATION AND DEBRIDEMENT LEFT WRIST;  Surgeon: Samuella Cota, MD;  Location: [facility];  Service: Orthopedics;  Laterality: Left;   SAVORY DILATION  07/20/2011   Procedure: SAVORY DILATION;  Surgeon: [name], MD;  Location: [facility];  Service: Endoscopy;  Laterality: N/A;   SPINAL FUSION  05/06/11    Family History  Problem Relation Age of Onset   Heart disease Mother    Heart attack Mother 89   Pancreatic cancer Father 65   Pancreatic cancer Brother    Anesthesia problems Neg Hx    Colon cancer Neg Hx    Liver cancer Neg Hx    Stomach cancer Neg Hx    Esophageal cancer Neg Hx    Rectal cancer Neg Hx       Social History   Socioeconomic History   Marital status: Divorced    Spouse name: Not on file   Number  of children: 2   Years of education: Not on file   Highest education level: Not on file  Occupational History   Occupation: Disabled  Tobacco Use   Smoking status: Some Days    Current packs/day: 0.00    Types: Cigarettes    Start date: 12/12/1978    Last attempt to quit: 12/12/2018    Years since quitting: 4.1   Smokeless tobacco: Never  Vaping Use   Vaping status: Never Used  Substance and Sexual Activity   Alcohol use: Not Currently    Alcohol/week: 2.0 standard drinks of alcohol    Types: 2 Shots of liquor per week   Drug use: No   Sexual activity: Not on file  Other Topics Concern   Not on file  Social History Narrative   Patient is divorced 2 sons he is a retired Engineer, 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  He drinks about a liter of caffeinated drinks a day past smoker no drug use no alcohol.   Social Drivers of Corporate investment banker Strain: Low Risk  (10/13/2022)   Overall Financial Resource Strain (CARDIA)    Difficulty of Paying Living Expenses: Not hard at all  Food Insecurity: No Food Insecurity (01/05/2023)   Hunger Vital Sign    Worried About Running Out of Food in the Last Year: Never true    Ran Out of Food in the Last Year: Never true  Transportation Needs: No Transportation Needs (01/05/2023)   PRAPARE - Administrator, Civil Service (Medical): No    Lack of Transportation (Non-Medical): No  Physical Activity: Insufficiently Active (10/13/2022)   Exercise Vital Sign    Days of Exercise per Week: 3 days    Minutes of Exercise per Session: 30 min  Stress: No Stress Concern Present (10/13/2022)   [employer] of Occupational Health - Occupational Stress Questionnaire    Feeling of Stress : Not at all  Social Connections: Socially Isolated (10/13/2022)   Social Connection and Isolation Panel [NHANES]  Frequency of Communication with Friends and Family: Twice a week    Frequency of Social Gatherings with  Friends and Family: More than three times a week    Attends Religious Services: Never    Database administrator or Organizations: No    Attends Banker Meetings: Never    Marital Status: Widowed    Allergies  Allergen Reactions   Nubain [Nalbuphine Hcl]     Muscle contraction     Current Outpatient Medications:    amitriptyline (ELAVIL) 50 MG tablet, Take 50 mg by mouth in the morning, at noon, and at bedtime., Disp: , Rfl:    amLODipine (NORVASC) 10 MG tablet, Take 1 tablet (10 mg total) by mouth daily., Disp: 30 tablet, Rfl: 0   cefadroxil (DURICEF) 500 MG capsule, Take 2 capsules (1,000 mg total) by mouth 2 (two) times daily. (Patient not taking: Reported on 02/17/2023), Disp: 120 capsule, Rfl: 2   cyclobenzaprine (FLEXERIL) 10 MG tablet, Take 10 mg by mouth 3 (three) times daily as needed., Disp: , Rfl:    metoprolol tartrate (LOPRESSOR) 25 MG tablet, Take 1 tablet (25 mg total) by mouth 2 (two) times daily., Disp: , Rfl:    Oxycodone HCl 10 MG TABS, Take 1 tablet (10 mg total) by mouth every 8 (eight) hours as needed. (Patient taking differently: Take 10 mg by mouth every 8 (eight) hours.), Disp: 90 tablet, Rfl: 0   pantoprazole (PROTONIX) 40 MG tablet, TAKE 1 TABLET BY MOUTH EVERY DAY BEFORE BREAKFAST, Disp: 90 tablet, Rfl: 0   polyethylene glycol (MIRALAX / GLYCOLAX) 17 g packet, Take 17 g by mouth daily as needed for moderate constipation., Disp: 14 each, Rfl: 0   potassium chloride SA (KLOR-CON M) 20 MEQ tablet, Take 1 tablet (20 mEq total) by mouth 2 (two) times daily., Disp: 30 tablet, Rfl: 0   rosuvastatin (CRESTOR) 40 MG tablet, Take 40 mg by mouth daily., Disp: , Rfl:    SUMAtriptan (IMITREX) 50 MG tablet, TAKE 1 TABLET (50 MG TOTAL) BY MOUTH DAILY. MAY REPEAT IN 2 HOURS IF HEADACHE PERSISTS OR RECURS., Disp: 9 tablet, Rfl: 1   Review of Systems     Objective:   Physical Exam Right knee 12/28/22:     Right knee 01/17/2023:     Knee  01/17/2023:    Left wrist 12/28/2022:         Assessment & Plan:   78 year old patient admitted with MSSA bacteremia and sepsis with left septic wrist status post I&D also with knee effusion with over 8000 white blood cells when tapped on IV antibiotics.  I remain concerned that the knee is infected his left wrist appears cured and orthopedic surgery hand have reviewed least him.  Of note he also had imaging of his spine performed which showed possibly some reactive endplate marrow changes in the C-spine thought to be degenerative versus infectious.  At present today we will get a sed rate CRP CBC with differential and BMP with GFR.  I will continue cefadroxil for now.  I will endeavor to get him a second opinion but if that is not successful we will reengage with Dr. Hulda Humphrey and advocate strongly for surgery.  If we are still not making progress I will stop his antibiotics and have knee aspirated by IR a month after stopping  I have personally spent 44 minutes involved in face-to-face and non-face-to-face activities for this patient on the day of the visit. Professional time spent includes the following  activities: Preparing to see the patient (review of tests), Obtaining and/or reviewing separately obtained history (admission/discharge record), Performing a medically appropriate examination and/or evaluation , Ordering medications/tests/procedures, referring and communicating with other health care professionals, Documenting clinical information in the EMR, Independently interpreting results (not separately reported), Communicating results to the patient/family/caregiver, Counseling and educating the patient/family/caregiver and Care coordination (not separately reported).

## 2023-02-22 ENCOUNTER — Encounter: Payer: Self-pay | Admitting: Infectious Disease

## 2023-02-22 ENCOUNTER — Ambulatory Visit (INDEPENDENT_AMBULATORY_CARE_PROVIDER_SITE_OTHER): Payer: Medicare HMO | Admitting: Infectious Disease

## 2023-02-22 ENCOUNTER — Other Ambulatory Visit: Payer: Self-pay

## 2023-02-22 VITALS — BP 191/73 | HR 86 | Temp 98.4°F | Wt 146.0 lb

## 2023-02-22 DIAGNOSIS — Z981 Arthrodesis status: Secondary | ICD-10-CM

## 2023-02-22 DIAGNOSIS — M5442 Lumbago with sciatica, left side: Secondary | ICD-10-CM | POA: Diagnosis not present

## 2023-02-22 DIAGNOSIS — R296 Repeated falls: Secondary | ICD-10-CM | POA: Diagnosis not present

## 2023-02-22 DIAGNOSIS — M25461 Effusion, right knee: Secondary | ICD-10-CM

## 2023-02-22 DIAGNOSIS — M00032 Staphylococcal arthritis, left wrist: Secondary | ICD-10-CM

## 2023-02-22 DIAGNOSIS — B9561 Methicillin susceptible Staphylococcus aureus infection as the cause of diseases classified elsewhere: Secondary | ICD-10-CM

## 2023-02-22 DIAGNOSIS — R2689 Other abnormalities of gait and mobility: Secondary | ICD-10-CM

## 2023-02-22 DIAGNOSIS — T847XXD Infection and inflammatory reaction due to other internal orthopedic prosthetic devices, implants and grafts, subsequent encounter: Secondary | ICD-10-CM

## 2023-02-22 DIAGNOSIS — Z8673 Personal history of transient ischemic attack (TIA), and cerebral infarction without residual deficits: Secondary | ICD-10-CM

## 2023-02-22 DIAGNOSIS — M5441 Lumbago with sciatica, right side: Secondary | ICD-10-CM

## 2023-02-22 DIAGNOSIS — G8929 Other chronic pain: Secondary | ICD-10-CM | POA: Diagnosis not present

## 2023-02-22 DIAGNOSIS — R413 Other amnesia: Secondary | ICD-10-CM

## 2023-02-22 DIAGNOSIS — R7881 Bacteremia: Secondary | ICD-10-CM | POA: Diagnosis not present

## 2023-02-23 ENCOUNTER — Telehealth: Payer: Self-pay

## 2023-02-23 LAB — CBC WITH DIFFERENTIAL/PLATELET
Absolute Lymphocytes: 2129 {cells}/uL (ref 850–3900)
Absolute Monocytes: 561 {cells}/uL (ref 200–950)
Basophils Absolute: 18 {cells}/uL (ref 0–200)
Basophils Relative: 0.3 %
Eosinophils Absolute: 153 {cells}/uL (ref 15–500)
Eosinophils Relative: 2.5 %
HCT: 29 % — ABNORMAL LOW (ref 38.5–50.0)
Hemoglobin: 9.4 g/dL — ABNORMAL LOW (ref 13.2–17.1)
MCH: 30.8 pg (ref 27.0–33.0)
MCHC: 32.4 g/dL (ref 32.0–36.0)
MCV: 95.1 fL (ref 80.0–100.0)
MPV: 9.8 fL (ref 7.5–12.5)
Monocytes Relative: 9.2 %
Neutro Abs: 3239 {cells}/uL (ref 1500–7800)
Neutrophils Relative %: 53.1 %
Platelets: 166 10*3/uL (ref 140–400)
RBC: 3.05 10*6/uL — ABNORMAL LOW (ref 4.20–5.80)
RDW: 12.4 % (ref 11.0–15.0)
Total Lymphocyte: 34.9 %
WBC: 6.1 10*3/uL (ref 3.8–10.8)

## 2023-02-23 LAB — COMPLETE METABOLIC PANEL WITH GFR
AG Ratio: 1.2 (calc) (ref 1.0–2.5)
ALT: 6 U/L — ABNORMAL LOW (ref 9–46)
AST: 21 U/L (ref 10–35)
Albumin: 3.4 g/dL — ABNORMAL LOW (ref 3.6–5.1)
Alkaline phosphatase (APISO): 92 U/L (ref 35–144)
BUN/Creatinine Ratio: 11 (calc) (ref 6–22)
BUN: 20 mg/dL (ref 7–25)
CO2: 26 mmol/L (ref 20–32)
Calcium: 8.4 mg/dL — ABNORMAL LOW (ref 8.6–10.3)
Chloride: 104 mmol/L (ref 98–110)
Creat: 1.77 mg/dL — ABNORMAL HIGH (ref 0.70–1.28)
Globulin: 2.9 g/dL (ref 1.9–3.7)
Glucose, Bld: 94 mg/dL (ref 65–99)
Potassium: 3.1 mmol/L — ABNORMAL LOW (ref 3.5–5.3)
Sodium: 140 mmol/L (ref 135–146)
Total Bilirubin: 0.3 mg/dL (ref 0.2–1.2)
Total Protein: 6.3 g/dL (ref 6.1–8.1)
eGFR: 39 mL/min/{1.73_m2} — ABNORMAL LOW (ref >=60)

## 2023-02-23 LAB — C-REACTIVE PROTEIN: CRP: 11.6 mg/L — ABNORMAL HIGH (ref <8.0)

## 2023-02-23 LAB — SEDIMENTATION RATE: Sed Rate: 19 mm/h (ref 0–20)

## 2023-02-23 NOTE — Telephone Encounter (Signed)
-----   Message from [place] sent at 02/23/2023 12:06 PM EST ----- Regarding: FW: Labs reassuring ----- Message ----- From: Janace Hoard Lab Results In Sent: 02/22/2023   4:21 PM EST To: Randall Hiss, MD

## 2023-03-09 ENCOUNTER — Other Ambulatory Visit: Payer: Self-pay | Admitting: Internal Medicine

## 2023-03-09 DIAGNOSIS — G8929 Other chronic pain: Secondary | ICD-10-CM

## 2023-03-09 DIAGNOSIS — M25461 Effusion, right knee: Secondary | ICD-10-CM

## 2023-03-09 NOTE — Telephone Encounter (Signed)
 Copied from CRM [id number]. Topic: Clinical - Medication Refill >> Mar 09, 2023 11:25 AM Jasmine D wrote: Most Recent Primary Care Visit:  Provider: MORRISON, RYAN G  Department: [facility]  Visit Type: OFFICE VISIT  Date: 02/17/2023  Medication: pantoprazole  (PROTONIX ) 40 MG tablet and Oxycodone  HCl 10 MG TABS   Has the patient contacted their pharmacy? Yes (Agent: If no, request that the patient contact the pharmacy for the refill. If patient does not wish to contact the pharmacy document the reason why and proceed with request.) (Agent: If yes, when and what did the pharmacy advise?) pharmacy advised to contact provider  Is this the correct pharmacy for this prescription? Yes If no, delete pharmacy and type the correct one.  This is the patient's preferred pharmacy:  [id number] - [place], [place] - [address] [place] [address] Phone: [phone] Fax: [phone]     Has the prescription been filled recently? No  Is the patient out of the medication? Yes  Has the patient been seen for an appointment in the last year OR does the patient have an upcoming appointment? Yes  Can we respond through MyChart? No  Agent: Please be advised that Rx refills may take up to 3 business days. We ask that you follow-up with your pharmacy.

## 2023-03-10 NOTE — Telephone Encounter (Signed)
 Spoke with patient's son, [name] ([place]), and relayed that per Dr. Ernie Heal labs look reassuring. Reminded them of follow up in April. Carolyn Cisco verbalized understanding and has no further questions.   Girl Schissler D Season Astacio, RN

## 2023-03-10 NOTE — Telephone Encounter (Signed)
 Pt requesting refill for Oxycodone  10 mg. Last OV 02/17/2023.

## 2023-03-12 ENCOUNTER — Encounter: Payer: Self-pay | Admitting: Internal Medicine

## 2023-03-22 ENCOUNTER — Telehealth (INDEPENDENT_AMBULATORY_CARE_PROVIDER_SITE_OTHER): Payer: Medicare HMO | Admitting: Internal Medicine

## 2023-03-22 ENCOUNTER — Encounter: Payer: Self-pay | Admitting: Internal Medicine

## 2023-03-22 VITALS — Ht 70.0 in | Wt 143.0 lb

## 2023-03-22 DIAGNOSIS — M5441 Lumbago with sciatica, right side: Secondary | ICD-10-CM | POA: Diagnosis not present

## 2023-03-22 DIAGNOSIS — M5442 Lumbago with sciatica, left side: Secondary | ICD-10-CM

## 2023-03-22 DIAGNOSIS — R413 Other amnesia: Secondary | ICD-10-CM

## 2023-03-22 DIAGNOSIS — M25461 Effusion, right knee: Secondary | ICD-10-CM

## 2023-03-22 DIAGNOSIS — Z8673 Personal history of transient ischemic attack (TIA), and cerebral infarction without residual deficits: Secondary | ICD-10-CM

## 2023-03-22 DIAGNOSIS — G8929 Other chronic pain: Secondary | ICD-10-CM | POA: Diagnosis not present

## 2023-03-22 MED ORDER — OXYCODONE HCL 10 MG PO TABS: 10.0000 mg | ORAL_TABLET | Freq: Three times a day (TID) | ORAL | 0 refills | Status: DC | PRN

## 2023-03-22 MED ORDER — AMITRIPTYLINE HCL 50 MG PO TABS: 50.0000 mg | ORAL_TABLET | Freq: Every day | ORAL | 3 refills | Status: DC

## 2023-03-22 MED ORDER — CYCLOBENZAPRINE HCL 10 MG PO TABS: 10.0000 mg | ORAL_TABLET | Freq: Three times a day (TID) | ORAL | 3 refills | Status: DC | PRN

## 2023-03-22 NOTE — Assessment & Plan Note (Signed)
Resolved, reviewed and agree with Dr. Daiva Eves assessments that this was likely from fall injury rather than sepsis.

## 2023-03-22 NOTE — Patient Instructions (Signed)
VISIT SUMMARY:  Michael Bender, a 78 year old male with a history of stroke and heart infection, visited today with concerns about mental status changes and fall risk. His mental status has remained stable, and he has not experienced any recent falls. He is currently taking medications for sleep, muscle relaxation, and pain management, and requires refills. His family provides daily support and has taken measures to ensure his safety at home.  YOUR PLAN:  -STROKE WITH RESIDUAL COGNITIVE AND MOBILITY IMPAIRMENTS: A stroke occurs when blood flow to a part of the brain is interrupted, causing brain cells to die. This can lead to cognitive and mobility impairments. Your mental status is stable, and your mobility has improved with the use of a walker. There have been no recent falls, and the infection that caused the stroke has resolved. It is important to continue using the walker and implement home safety modifications. If family support becomes insufficient, consider home health services.  -CHRONIC PAIN MANAGEMENT: Chronic pain is long-lasting pain that can be managed with medications. You are currently taking amitriptyline, cyclobenzaprine, and oxycodone. The risks of pain medication, including dependency and side effects, were discussed. Your current oxycodone dosage is 10 mg three times a day. We will reorder your medications for one month and refer you to a pain management clinic. Monthly follow-up visits are scheduled, with the option for video calls.  -GENERAL HEALTH MAINTENANCE: General health maintenance includes routine check-ups and lab tests to monitor your overall health. You are scheduled for an annual check-up next week. Please attend the annual check-up on March 30, 2023.  INSTRUCTIONS:  Please attend your annual check-up on March 30, 2023. Monthly follow-up visits for pain management are scheduled, with the option for video calls. Reorder medications: amitriptyline 50 mg at  bedtime, cyclobenzaprine 10 mg three times a day, and oxycodone 10 mg tablets three times a day for one month. Refer to the pain management clinic in [place] on [address].  It was a pleasure seeing you today! Your health and satisfaction are our top priorities.  [name], MD  Your Providers PCP: Lula Olszewski, MD,  [phone]) Referring Provider: Lula Olszewski, MD,  [phone]) Care Team Provider: Vladimir Faster, DO,  [phone]) Care Team Provider: Daiva Eves, Lisette Grinder, MD,  [phone])     NEXT STEPS: [x]  Early Intervention: Schedule sooner appointment, call our on-call services, or go to emergency room if there is any significant Increase in pain or discomfort New or worsening symptoms Sudden or severe changes in your health [x]  Flexible Follow-Up: We recommend a No follow-ups on file. for optimal routine care. This allows for progress monitoring and treatment adjustments. [x]  Preventive Care: Schedule your annual preventive care visit! It's typically covered by insurance and helps identify potential health issues early. [x]  Lab & X-ray Appointments: Incomplete tests scheduled today, or call to schedule. X-rays: [facility] (M-F, 8:30am-noon or 1pm-5pm). [x]  Medical Information Release: Sign a release form at front desk to obtain relevant medical information we don't have.  MAKING THE MOST OF OUR FOCUSED 20 MINUTE APPOINTMENTS: [x]   Clearly state your top concerns at the beginning of the visit to focus our discussion [x]   If you anticipate you will need more time, please inform the front desk during scheduling - we can book multiple appointments in the same week. [x]   If you have transportation problems- use our convenient video appointments or ask about transportation support. [x]   We can get down to business faster  if you use MyChart to update information before the visit and submit non-urgent questions before your visit. Thank you for  taking the time to provide details through MyChart.  Let our nurse know and she can import this information into your encounter documents.  Arrival and Wait Times: [x]   Arriving on time ensures that everyone receives prompt attention. [x]   Early morning (8a) and afternoon (1p) appointments tend to have shortest wait times. [x]   Unfortunately, we cannot delay appointments for late arrivals or hold slots during phone calls.  Getting Answers and Following Up [x]   Simple Questions & Concerns: For quick questions or basic follow-up after your visit, reach [place] at [phone] or MyChart messaging. [x]   Complex Concerns: If your concern is more complex, scheduling an appointment might be best. Discuss this with the staff to find the most suitable option. [x]   Lab & Imaging Results: We'll contact you directly if results are abnormal or you don't use MyChart. Most normal results will be on MyChart within 2-3 business days, with a review message from Dr. Jon Billings. Haven't heard back in 2 weeks? Need results sooner? Contact us at [phone]. [x]   Referrals: Our referral coordinator will manage specialist referrals. The specialist's office should contact you within 2 weeks to schedule an appointment. Call us if you haven't heard from them after 2 weeks.  Staying Connected [x]   MyChart: Activate your MyChart for the fastest way to access results and message [place]. See the last page of this paperwork for instructions on how to activate.  Bring to Your Next Appointment [x]   Medications: Please bring all your medication bottles to your next appointment to ensure we have an accurate record of your prescriptions. [x]   Health Diaries: If you're monitoring any health conditions at home, keeping a diary of your readings can be very helpful for discussions at your next appointment.  Billing [x]   X-ray & Lab Orders: These are billed by separate companies. Contact the invoicing company directly for questions or  concerns. [x]   Visit Charges: Discuss any billing inquiries with our administrative services team.  Your Satisfaction Matters [x]   Share Your Experience: We strive for your satisfaction! If you have any complaints, or preferably compliments, please let Dr. Jon Billings know directly or contact our Practice Administrators, Edwena Felty or [employer], by asking at the front desk.   Reviewing Your Records [x]   Review this early draft of your clinical encounter notes below and the final encounter summary tomorrow on MyChart after its been completed.  All orders placed so far are visible here: History of CVA (cerebrovascular accident)  Chronic bilateral low back pain with bilateral sciatica -     oxyCODONE HCl; Take 1 tablet (10 mg total) by mouth every 8 (eight) hours as needed.  Dispense: 90 tablet; Refill: 0 -     Cyclobenzaprine HCl; Take 1 tablet (10 mg total) by mouth 3 (three) times daily as needed.  Dispense: 270 tablet; Refill: 3 -     Ambulatory referral to Pain Clinic  Effusion of right knee joint -     oxyCODONE HCl; Take 1 tablet (10 mg total) by mouth every 8 (eight) hours as needed.  Dispense: 90 tablet; Refill: 0 -     Amitriptyline HCl; Take 1 tablet (50 mg total) by mouth at bedtime.  Dispense: 90 tablet; Refill: 3 -     Ambulatory referral to Pain Clinic  Memory loss -     oxyCODONE HCl; Take 1 tablet (10 mg total)  by mouth every 8 (eight) hours as needed.  Dispense: 90 tablet; Refill: 0

## 2023-03-22 NOTE — Assessment & Plan Note (Signed)
Stroke with Residual Cognitive and Mobility Impairments The stroke is likely secondary to a past heart infection. Current symptoms include a well-managed mental status and improved mobility with walker use. No recent falls have been reported. The infection has resolved, and symptoms are attributed to stroke damage. There is a high fall risk due to cognitive impairment. He is resistant to home health services but receives daily support from family and neighbors. Permanent damage was discussed, with potential recovery through brain plasticity. The family is open to home health services if needed. Encourage the use of a walker and implement home safety modifications. Consider home health services if family support becomes insufficient.

## 2023-03-22 NOTE — Assessment & Plan Note (Signed)
General Health Maintenance He is scheduled for an annual check-up next week. Attend the annual check-up on March 30, 2023.

## 2023-03-22 NOTE — Assessment & Plan Note (Signed)
Chronic Pain Management Chronic pain is managed with amitriptyline, cyclobenzaprine, and oxycodone. There has been no follow-up from pain management specialists. The risks of pain medication, including dependency and side effects, were discussed. The current oxycodone dosage is 10 mg three times a day, reduced from previous higher doses. Agreed to monthly follow-up visits, with the option for video calls. Reorder amitriptyline 50 mg at bedtime, cyclobenzaprine 10 mg three times a day, and oxycodone 10 mg tablets three times a day for one month. Refer to the pain management clinic in [place] on [address]. Schedule monthly follow-up visits, with the option for video calls.

## 2023-03-22 NOTE — Progress Notes (Signed)
==============================  [facility]: [phone]   --  Virtual Video Medical Office Visit --  Patient: Michael Bender      Age: 78 y.o.       Sex:  male  Date:   03/22/2023 Today's Healthcare Provider: Lula Olszewski, MD  ==============================  CHIEF COMPLAINT: Medication Refill  SUBJECTIVE: Chart reviewed: has Pulmonary emphysema (HCC); GERD; CONSTIPATION, CHRONIC; DISC DISEASE, LUMBAR; Bilateral leg pain; Esophageal dysphagia; Right sided temporal headache; Insomnia; PAD (peripheral artery disease) (HCC); Senile ecchymosis; History of CVA (cerebrovascular accident); HLD (hyperlipidemia); COPD (chronic obstructive pulmonary disease) (HCC); Iron deficiency anemia; Duodenal stenosis; Chronic narcotic dependence (HCC); Atherosclerosis of aorta (HCC); History of fusion of cervical spine; History of lumbar fusion; High risk medication use; Intractable pain; Gynecomastia, male; Cachexia (HCC); Underweight on examination; Chronic low back pain with bilateral sciatica; AKI (acute kidney injury) (HCC); Syncope; MSSA bacteremia; Septic infrapatellar bursitis of right knee; Staphylococcal arthritis of left wrist (HCC); Infected blister of left index finger; Staphylococcal arthritis of right knee (HCC); Neck pain; Finger pain, left; Septic arthritis of wrist, left (HCC); Cervical discitis; Hardware complicating wound infection (HCC); Effusion of right knee joint; Memory loss; Frequent falls; Nonintractable headache; Effusion of right knee; and Balance problems on their problem list..  Chart reviewed:  has a past medical history of Arthritis, Balance problems (02/21/2023), Blood transfusion, Chronic back pain, COPD (chronic obstructive pulmonary disease) (HCC), Disturbance of skin sensation (05/22/2018), Effusion of right knee (02/21/2023), Effusion of right knee joint (01/16/2023), Essential hypertension (03/05/2007), Gastric erosion, Gastritis and  gastroduodenitis, Gastrointestinal hemorrhage with melena (11/20/2018), GIB (gastrointestinal bleeding) (07/18/2019), Headache(784.0), Hemorrhoids, internal, History of duodenal ulcer, History of lumbar fusion (03/03/2022), History of upper gastrointestinal bleeding (03/03/2022), HLD (hyperlipidemia), Hypertension, Loss of weight, Neck rigidity, Nocturia, PAIN, CHRONIC NEC (10/06/2006), Pneumonia, Prostate disease, and S/P cervical spinal fusion (03/03/2022). Verbally reviewed with patient:   History of Present Illness Michael Bender is a 78 year old male with a history of stroke and heart infection who presents with concerns about mental status changes and fall risk. He is accompanied by his son, who is his primary caregiver.  He is experiencing concerns regarding his mental status, which may be related to a previous infection that affected his brain. During a recent visit to the ER, he underwent x-rays, a CT scan, and blood work to check for infection markers. The results showed no significant changes. His mental status has remained stable since then, with no significant changes noted.  He has a history of stroke, which is believed to have occurred during a previous heart infection. This has resulted in some brain damage, but there have been no new strokes or worsening of his condition. Imaging and lab work suggest no new or worsening infection.  He has a history of falls, which were a concern due to his mobility issues. However, he has not experienced any falls recently. He initially used a walker for stability but has since been able to walk daily without incident. His sons assist with daily activities and ensure his safety at home, including grocery shopping and managing household tasks. He is considered a high fall risk, and his family has taken measures to mitigate this risk, such as installing railing handles and ensuring he uses a walker when necessary.  He is currently taking amitriptyline  and cyclobenzaprine for sleep and muscle relaxation, respectively, and oxycodone for pain management. He has run out of these medications and requires refills. His previous dosage  of oxycodone was reduced from 30 mg three times a day to 10 mg three times a day, which is more manageable.  Discussed Results RADIOLOGY X-ray: No change in markers CT: No change in markers  Reviewed charted medication(s) and Verbally Confirmed Medications - Amitriptyline currently out - Cyclobenzaprine currently out - Oxycodone about out Current Outpatient Medications on File Prior to Visit  Medication Sig   amLODipine (NORVASC) 10 MG tablet Take 1 tablet (10 mg total) by mouth daily.   cefadroxil (DURICEF) 500 MG capsule Take 2 capsules (1,000 mg total) by mouth 2 (two) times daily.   metoprolol tartrate (LOPRESSOR) 25 MG tablet Take 1 tablet (25 mg total) by mouth 2 (two) times daily.   pantoprazole (PROTONIX) 40 MG tablet TAKE 1 TABLET BY MOUTH EVERY DAY BEFORE BREAKFAST   polyethylene glycol (MIRALAX / GLYCOLAX) 17 g packet Take 17 g by mouth daily as needed for moderate constipation.   potassium chloride SA (KLOR-CON M) 20 MEQ tablet Take 1 tablet (20 mEq total) by mouth 2 (two) times daily.   rosuvastatin (CRESTOR) 40 MG tablet Take 40 mg by mouth daily.   SUMAtriptan (IMITREX) 50 MG tablet TAKE 1 TABLET (50 MG TOTAL) BY MOUTH DAILY. MAY REPEAT IN 2 HOURS IF HEADACHE PERSISTS OR RECURS.   No current facility-administered medications on file prior to visit.   Medications Discontinued During This Encounter  Medication Reason   cyclobenzaprine (FLEXERIL) 10 MG tablet Reorder   amitriptyline (ELAVIL) 50 MG tablet Reorder   Oxycodone HCl 10 MG TABS Reorder    OBJECTIVE: Objective  General Appearance:  Well Developed, Well Nourished, No Acute Distress by Limited Video Assessment Pulmonary:  No Respiratory Distress Apparent. Normal Work of Breathing.   Neurological:  Awake, Alert. No Obvious Focal  Neurological Deficits or Cognitive Impairments.  Sensorium Seems Unclouded. Psychiatric:  Appropriate Mood, Pleasant Demeanor, Calm, Articulate, Good Mood Patient woke up sleepy, most history taken from son who cares for         No results found for any visits on 03/22/23. Office Visit on 02/22/2023  Component Date Value   Sed Rate 02/22/2023 19    CRP 02/22/2023 11.6 (H)    WBC 02/22/2023 6.1    RBC 02/22/2023 3.05 (L)    Hemoglobin 02/22/2023 9.4 (L)    HCT 02/22/2023 29.0 (L)    MCV 02/22/2023 95.1    MCH 02/22/2023 30.8    MCHC 02/22/2023 32.4    RDW 02/22/2023 12.4    Platelets 02/22/2023 166    MPV 02/22/2023 9.8    Neutro Abs 02/22/2023 3,239    Absolute Lymphocytes 02/22/2023 2,129    Absolute Monocytes 02/22/2023 561    Eosinophils Absolute 02/22/2023 153    Basophils Absolute 02/22/2023 18    Neutrophils Relative % 02/22/2023 53.1    Total Lymphocyte 02/22/2023 34.9    Monocytes Relative 02/22/2023 9.2    Eosinophils Relative 02/22/2023 2.5    Basophils Relative 02/22/2023 0.3    Glucose, Bld 02/22/2023 94    BUN 02/22/2023 20    Creat 02/22/2023 1.77 (H)    eGFR 02/22/2023 39 (L)    BUN/Creatinine Ratio 02/22/2023 11    Sodium 02/22/2023 140    Potassium 02/22/2023 3.1 (L)    Chloride 02/22/2023 104    CO2 02/22/2023 26    Calcium 02/22/2023 8.4 (L)    Total Protein 02/22/2023 6.3    Albumin 02/22/2023 3.4 (L)    Globulin 02/22/2023 2.9    AG Ratio 02/22/2023 1.2  Total Bilirubin 02/22/2023 0.3    Alkaline phosphatase (AP* 02/22/2023 92    AST 02/22/2023 21    ALT 02/22/2023 6 (L)   Admission on 02/17/2023, Discharged on 02/17/2023  Component Date Value   Color, Urine 02/17/2023 YELLOW    APPearance 02/17/2023 CLEAR    Specific Gravity, Urine 02/17/2023 1.011    pH 02/17/2023 5.0    Glucose, UA 02/17/2023 NEGATIVE    Hgb urine dipstick 02/17/2023 MODERATE (A)    Bilirubin Urine 02/17/2023 NEGATIVE    Ketones, ur 02/17/2023 NEGATIVE     Protein, ur 02/17/2023 30 (A)    Nitrite 02/17/2023 NEGATIVE    Leukocytes,Ua 02/17/2023 NEGATIVE    RBC / HPF 02/17/2023 0-5    WBC, UA 02/17/2023 0-5    Bacteria, UA 02/17/2023 NONE SEEN    Squamous Epithelial / HPF 02/17/2023 0-5    Mucus 02/17/2023 PRESENT    Glucose-Capillary 02/17/2023 164 (H)    WBC 02/17/2023 5.0    RBC 02/17/2023 2.85 (L)    Hemoglobin 02/17/2023 8.9 (L)    HCT 02/17/2023 27.3 (L)    MCV 02/17/2023 95.8    MCH 02/17/2023 31.2    MCHC 02/17/2023 32.6    RDW 02/17/2023 13.2    Platelets 02/17/2023 141 (L)    nRBC 02/17/2023 0.0    Neutrophils Relative % 02/17/2023 65    Neutro Abs 02/17/2023 3.2    Lymphocytes Relative 02/17/2023 24    Lymphs Abs 02/17/2023 1.2    Monocytes Relative 02/17/2023 10    Monocytes Absolute 02/17/2023 0.5    Eosinophils Relative 02/17/2023 1    Eosinophils Absolute 02/17/2023 0.1    Basophils Relative 02/17/2023 0    Basophils Absolute 02/17/2023 0.0    Immature Granulocytes 02/17/2023 0    Abs Immature Granulocytes 02/17/2023 0.01    Sodium 02/17/2023 139    Potassium 02/17/2023 3.6    Chloride 02/17/2023 105    CO2 02/17/2023 23    Glucose, Bld 02/17/2023 188 (H)    BUN 02/17/2023 26 (H)    Creatinine, Ser 02/17/2023 2.10 (H)    Calcium 02/17/2023 8.5 (L)    Total Protein 02/17/2023 6.3 (L)    Albumin 02/17/2023 3.1 (L)    AST 02/17/2023 32    ALT 02/17/2023 11    Alkaline Phosphatase 02/17/2023 79    Total Bilirubin 02/17/2023 0.5    GFR, Estimated 02/17/2023 32 (L)    Anion gap 02/17/2023 11    Sed Rate 02/17/2023 18 (H)    CRP 02/17/2023 0.8   Office Visit on 01/17/2023  Component Date Value   CRP 01/17/2023 5.6    Sed Rate 01/17/2023 36 (H)    Glucose, Bld 01/17/2023 167 (H)    BUN 01/17/2023 21    Creat 01/17/2023 1.71 (H)    eGFR 01/17/2023 40 (L)    BUN/Creatinine Ratio 01/17/2023 12    Sodium 01/17/2023 141    Potassium 01/17/2023 4.7    Chloride 01/17/2023 107    CO2 01/17/2023 26    Calcium  01/17/2023 8.7    Total Protein 01/17/2023 6.6    Albumin 01/17/2023 3.7    Globulin 01/17/2023 2.9    AG Ratio 01/17/2023 1.3    Total Bilirubin 01/17/2023 0.4    Alkaline phosphatase (AP* 01/17/2023 106    AST 01/17/2023 19    ALT 01/17/2023 6 (L)    WBC 01/17/2023 5.6    RBC 01/17/2023 2.69 (L)    Hemoglobin 01/17/2023 8.3 (L)    HCT  01/17/2023 26.1 (L)    MCV 01/17/2023 97.0    MCH 01/17/2023 30.9    MCHC 01/17/2023 31.8 (L)    RDW 01/17/2023 13.1    Platelets 01/17/2023 181    MPV 01/17/2023 10.4    Neutro Abs 01/17/2023 2,638    Absolute Lymphocytes 01/17/2023 2,050    Absolute Monocytes 01/17/2023 521    Eosinophils Absolute 01/17/2023 353    Basophils Absolute 01/17/2023 39    Neutrophils Relative % 01/17/2023 47.1    Total Lymphocyte 01/17/2023 36.6    Monocytes Relative 01/17/2023 9.3    Eosinophils Relative 01/17/2023 6.3    Basophils Relative 01/17/2023 0.7   Lab on 01/02/2023  Component Date Value   WBC 01/02/2023 6.1    RBC 01/02/2023 2.56 (L)    Hemoglobin 01/02/2023 7.9 (L)    HCT 01/02/2023 25.2 (L)    MCV 01/02/2023 98.4    MCH 01/02/2023 30.9    MCHC 01/02/2023 31.3    RDW 01/02/2023 14.1    Platelets 01/02/2023 214    nRBC 01/02/2023 0.0    Neutrophils Relative % 01/02/2023 58    Neutro Abs 01/02/2023 3.5    Lymphocytes Relative 01/02/2023 29    Lymphs Abs 01/02/2023 1.7    Monocytes Relative 01/02/2023 10    Monocytes Absolute 01/02/2023 0.6    Eosinophils Relative 01/02/2023 3    Eosinophils Absolute 01/02/2023 0.2    Basophils Relative 01/02/2023 0    Basophils Absolute 01/02/2023 0.0    Immature Granulocytes 01/02/2023 0    Abs Immature Granulocytes 01/02/2023 0.02   Office Visit on 12/28/2022  Component Date Value   CRP 12/28/2022 32.2 (H)    Sed Rate 12/28/2022 67 (H)    Glucose, Bld 12/28/2022 135 (H)    BUN 12/28/2022 27 (H)    Creat 12/28/2022 1.79 (H)    eGFR 12/28/2022 38 (L)    BUN/Creatinine Ratio 12/28/2022 15    Sodium  12/28/2022 139    Potassium 12/28/2022 5.1    Chloride 12/28/2022 113 (H)    CO2 12/28/2022 18 (L)    Calcium 12/28/2022 8.5 (L)    Total Protein 12/28/2022 6.7    Albumin 12/28/2022 3.5 (L)    Globulin 12/28/2022 3.2    AG Ratio 12/28/2022 1.1    Total Bilirubin 12/28/2022 0.2    Alkaline phosphatase (AP* 12/28/2022 118    AST 12/28/2022 12    ALT 12/28/2022 <3 (L)    WBC 12/28/2022 5.6    RBC 12/28/2022 2.30 (L)    Hemoglobin 12/28/2022 7.1 (L)    HCT 12/28/2022 22.2 (L)    MCV 12/28/2022 96.5    MCH 12/28/2022 30.9    MCHC 12/28/2022 32.0    RDW 12/28/2022 13.1    Platelets 12/28/2022 205    MPV 12/28/2022 9.4    Neutro Abs 12/28/2022 3,875    Absolute Lymphocytes 12/28/2022 1,042    Absolute Monocytes 12/28/2022 465    Eosinophils Absolute 12/28/2022 196    Basophils Absolute 12/28/2022 22    Neutrophils Relative % 12/28/2022 69.2    Total Lymphocyte 12/28/2022 18.6    Monocytes Relative 12/28/2022 8.3    Eosinophils Relative 12/28/2022 3.5    Basophils Relative 12/28/2022 0.4   No results displayed because visit has over 200 results.    Admission on 10/27/2022, Discharged on 11/02/2022  Component Date Value   WBC 10/27/2022 5.4    RBC 10/27/2022 3.37 (L)    Hemoglobin 10/27/2022 10.5 (L)    HCT 10/27/2022 30.7 (  L)    MCV 10/27/2022 91.1    MCH 10/27/2022 31.2    MCHC 10/27/2022 34.2    RDW 10/27/2022 13.2    Platelets 10/27/2022 133 (L)    nRBC 10/27/2022 0.0    Color, Urine 10/27/2022 YELLOW    APPearance 10/27/2022 CLEAR    Specific Gravity, Urine 10/27/2022 1.010    pH 10/27/2022 6.0    Glucose, UA 10/27/2022 NEGATIVE    Hgb urine dipstick 10/27/2022 LARGE (A)    Bilirubin Urine 10/27/2022 NEGATIVE    Ketones, ur 10/27/2022 NEGATIVE    Protein, ur 10/27/2022 100 (A)    Nitrite 10/27/2022 NEGATIVE    Leukocytes,Ua 10/27/2022 NEGATIVE    Sodium 10/27/2022 134 (L)    Potassium 10/27/2022 2.1 (LL)    Chloride 10/27/2022 99    CO2 10/27/2022 24     Glucose, Bld 10/27/2022 126 (H)    BUN 10/27/2022 26 (H)    Creatinine, Ser 10/27/2022 4.21 (H)    Calcium 10/27/2022 7.4 (L)    Total Protein 10/27/2022 6.5    Albumin 10/27/2022 2.9 (L)    AST 10/27/2022 126 (H)    ALT 10/27/2022 49 (H)    Alkaline Phosphatase 10/27/2022 153 (H)    Total Bilirubin 10/27/2022 0.2 (L)    GFR, Estimated 10/27/2022 14 (L)    Anion gap 10/27/2022 11    Magnesium 10/27/2022 1.9    RBC / HPF 10/27/2022 11-20    WBC, UA 10/27/2022 0-5    Bacteria, UA 10/27/2022 NONE SEEN    Squamous Epithelial / HPF 10/27/2022 0-5    Total CK 10/27/2022 2,828 (H)    Sodium 10/28/2022 138    Potassium 10/28/2022 2.6 (LL)    Chloride 10/28/2022 104    CO2 10/28/2022 20 (L)    Glucose, Bld 10/28/2022 82    BUN 10/28/2022 25 (H)    Creatinine, Ser 10/28/2022 3.62 (H)    Calcium 10/28/2022 7.6 (L)    GFR, Estimated 10/28/2022 17 (L)    Anion gap 10/28/2022 14    Magnesium 10/28/2022 2.5 (H)    Phosphorus 10/28/2022 3.7    Total CK 10/28/2022 2,043 (H)    Total Protein 10/28/2022 5.5 (L)    Albumin 10/28/2022 2.4 (L)    AST 10/28/2022 95 (H)    ALT 10/28/2022 39    Alkaline Phosphatase 10/28/2022 134 (H)    Total Bilirubin 10/28/2022 0.5    Bilirubin, Direct 10/28/2022 0.2    Indirect Bilirubin 10/28/2022 0.3    Vitamin B-12 10/28/2022 676    Sodium 10/29/2022 142    Potassium 10/29/2022 3.2 (L)    Chloride 10/29/2022 106    CO2 10/29/2022 24    Glucose, Bld 10/29/2022 147 (H)    BUN 10/29/2022 24 (H)    Creatinine, Ser 10/29/2022 3.19 (H)    Calcium 10/29/2022 7.8 (L)    GFR, Estimated 10/29/2022 19 (L)    Anion gap 10/29/2022 12    Total CK 10/29/2022 992 (H)    Folate 10/30/2022 13.1    Iron 10/30/2022 46    TIBC 10/30/2022 223 (L)    Saturation Ratios 10/30/2022 21    UIBC 10/30/2022 177    Ferritin 10/30/2022 224    Retic Ct Pct 10/30/2022 0.6    RBC. 10/30/2022 3.41 (L)    Retic Count, Absolute 10/30/2022 19.8    Immature Retic Fract 10/30/2022  1.8 (L)    Sodium 10/30/2022 143    Potassium 10/30/2022 3.6    Chloride 10/30/2022 101  CO2 10/30/2022 27    Glucose, Bld 10/30/2022 183 (H)    BUN 10/30/2022 17    Creatinine, Ser 10/30/2022 2.28 (H)    Calcium 10/30/2022 8.2 (L)    GFR, Estimated 10/30/2022 29 (L)    Anion gap 10/30/2022 15    Total CK 10/30/2022 802 (H)    WBC 10/31/2022 4.5    RBC 10/31/2022 3.53 (L)    Hemoglobin 10/31/2022 11.3 (L)    HCT 10/31/2022 32.5 (L)    MCV 10/31/2022 92.1    MCH 10/31/2022 32.0    MCHC 10/31/2022 34.8    RDW 10/31/2022 13.1    Platelets 10/31/2022 144 (L)    nRBC 10/31/2022 0.0    Sodium 10/31/2022 140    Potassium 10/31/2022 3.6    Chloride 10/31/2022 104    CO2 10/31/2022 28    Glucose, Bld 10/31/2022 124 (H)    BUN 10/31/2022 13    Creatinine, Ser 10/31/2022 2.13 (H)    Calcium 10/31/2022 8.1 (L)    Total Protein 10/31/2022 5.8 (L)    Albumin 10/31/2022 2.5 (L)    AST 10/31/2022 114 (H)    ALT 10/31/2022 37    Alkaline Phosphatase 10/31/2022 130 (H)    Total Bilirubin 10/31/2022 0.8    GFR, Estimated 10/31/2022 31 (L)    Anion gap 10/31/2022 8    Total CK 10/31/2022 818 (H)    Total CK 11/01/2022 482 (H)    Sodium 11/01/2022 139    Potassium 11/01/2022 2.6 (LL)    Chloride 11/01/2022 98    CO2 11/01/2022 28    Glucose, Bld 11/01/2022 128 (H)    BUN 11/01/2022 11    Creatinine, Ser 11/01/2022 1.65 (H)    Calcium 11/01/2022 8.0 (L)    Total Protein 11/01/2022 5.6 (L)    Albumin 11/01/2022 2.4 (L)    AST 11/01/2022 146 (H)    ALT 11/01/2022 43    Alkaline Phosphatase 11/01/2022 121    Total Bilirubin 11/01/2022 0.2 (L)    GFR, Estimated 11/01/2022 43 (L)    Anion gap 11/01/2022 13    Magnesium 11/01/2022 1.3 (L)    Potassium 11/01/2022 2.7 (LL)    Magnesium 11/01/2022 2.4    Potassium 11/01/2022 3.6    Sodium 11/02/2022 138    Potassium 11/02/2022 3.1 (L)    Chloride 11/02/2022 104    CO2 11/02/2022 25    Glucose, Bld 11/02/2022 195 (H)    BUN  11/02/2022 9    Creatinine, Ser 11/02/2022 1.61 (H)    Calcium 11/02/2022 7.9 (L)    Total Protein 11/02/2022 5.7 (L)    Albumin 11/02/2022 2.3 (L)    AST 11/02/2022 84 (H)    ALT 11/02/2022 37    Alkaline Phosphatase 11/02/2022 117    Total Bilirubin 11/02/2022 0.3    GFR, Estimated 11/02/2022 44 (L)    Anion gap 11/02/2022 9   Office Visit on 05/03/2022  Component Date Value   Amphetamines, Urine 05/03/2022 Negative    Cannabinoid Quant, Ur 05/03/2022 Negative    Cocaine (Metab.) 05/03/2022 Negative    OPIATE QUANTITATIVE URINE 05/03/2022 Negative    PCP Quant, Ur 05/03/2022 Negative    WBC 05/03/2022 7.9    RBC 05/03/2022 4.27    Hemoglobin 05/03/2022 13.5    HCT 05/03/2022 39.9    MCV 05/03/2022 93.6    MCHC 05/03/2022 33.8    RDW 05/03/2022 13.9    Platelets 05/03/2022 223.0    Neutrophils Relative % 05/03/2022 58.4  Lymphocytes Relative 05/03/2022 28.0    Monocytes Relative 05/03/2022 8.8    Eosinophils Relative 05/03/2022 4.3    Basophils Relative 05/03/2022 0.5    Neutro Abs 05/03/2022 4.6    Lymphs Abs 05/03/2022 2.2    Monocytes Absolute 05/03/2022 0.7    Eosinophils Absolute 05/03/2022 0.3    Basophils Absolute 05/03/2022 0.0    Sodium 05/03/2022 138    Potassium 05/03/2022 3.5    Chloride 05/03/2022 101    CO2 05/03/2022 32    Glucose, Bld 05/03/2022 110 (H)    BUN 05/03/2022 14    Creatinine, Ser 05/03/2022 1.06    Total Bilirubin 05/03/2022 0.4    Alkaline Phosphatase 05/03/2022 83    AST 05/03/2022 48 (H)    ALT 05/03/2022 20    Total Protein 05/03/2022 6.5    Albumin 05/03/2022 3.7    GFR 05/03/2022 67.73    Calcium 05/03/2022 8.6    Cholesterol 05/03/2022 93    Triglycerides 05/03/2022 119.0    HDL 05/03/2022 56.30    VLDL 05/03/2022 23.8    LDL Cholesterol 05/03/2022 13    Total CHOL/HDL Ratio 05/03/2022 2    NonHDL 05/03/2022 36.54    Hgb A1c MFr Bld 05/03/2022 6.4    Microalb, Ur 05/03/2022 5.4 (H)    Creatinine,U 05/03/2022 33.0     Microalb Creat Ratio 05/03/2022 16.2    TSH 05/03/2022 1.88    HIV 1&2 Ab, 4th Generati* 05/03/2022 NON-REACTIVE    Sed Rate 05/03/2022 16    CRP 05/03/2022 <1.0   No image results found. MR Brain W and Wo Contrast Result Date: 02/17/2023 CLINICAL DATA:  Altered mental status EXAM: MRI HEAD WITHOUT AND WITH CONTRAST TECHNIQUE: Multiplanar, multiecho pulse sequences of the brain and surrounding structures were obtained without and with intravenous contrast. CONTRAST:  7mL GADAVIST GADOBUTROL 1 MMOL/ML IV SOLN COMPARISON:  None Available. FINDINGS: Brain: No acute infarct, mass effect or extra-axial collection. Multiple chronic microhemorrhages of the cerebellum and occipital lobes. There is multifocal hyperintense T2-weighted signal within the white matter. Generalized volume loss. 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 The midline structures are normal. There is no abnormal contrast enhancement. Vascular: Normal flow voids. Skull and upper cervical spine: Normal calvarium and skull base. Visualized upper cervical spine and soft tissues are normal. Sinuses/Orbits:No paranasal sinus fluid levels or advanced mucosal thickening. No mastoid or middle ear effusion. Normal orbits. IMPRESSION: 1. No acute intracranial abnormality. 2. Multiple chronic microhemorrhages of the cerebellum and occipital lobes. 3. 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 Electronically Signed   By: Deatra Robinson M.D.   On: 02/17/2023 22:58   CT Head Wo Contrast Result Date: 02/17/2023 CLINICAL DATA:  Multiple falls and weakness, head and neck trauma EXAM: CT HEAD WITHOUT CONTRAST CT CERVICAL SPINE WITHOUT CONTRAST TECHNIQUE: Multidetector CT imaging of the head and cervical spine was performed following the standard protocol without intravenous contrast. Multiplanar CT image reconstructions of the cervical spine were also generated. RADIATION DOSE  REDUCTION: This exam was performed according to the departmental dose-optimization program which includes automated exposure control, adjustment of the mA and/or kV according to patient size and/or use of iterative reconstruction technique. COMPARISON:  11/05/2022 CT head, 06/23/2009 CT cervical spine FINDINGS: CT HEAD FINDINGS Brain: No evidence of acute infarct, hemorrhage, mass, mass effect, or midline shift. No hydrocephalus or extra-axial fluid collection. Periventricular white matter changes, likely the sequela of chronic small vessel ischemic  disease. Age related cerebral atrophy. Remote lacunar infarct in the left basal ganglia. Vascular: No hyperdense vessel. Atherosclerotic calcifications in the intracranial carotid and vertebral arteries. Skull: Negative for fracture or focal lesion. Sinuses/Orbits: Persistent opacification of the right sphenoid sinus with osseous thickening, consistent with chronic sinusitis. Status post bilateral lens replacements. Other: The mastoid air cells are well aerated. CT CERVICAL SPINE FINDINGS Alignment: No traumatic listhesis. Trace anterolisthesis of C7 on T1, which appears degenerative. Skull base and vertebrae: No acute fracture or suspicious osseous lesion. Posterior fusion C3-C4. Soft tissues and spinal canal: No prevertebral fluid or swelling. No visible canal hematoma. Disc levels: Degenerative changes in the cervical spine.No high-grade spinal canal stenosis. Upper chest: No focal pulmonary opacity or pleural effusion. Emphysema. IMPRESSION: 1. No acute intracranial process. 2. No acute fracture or traumatic listhesis in the cervical spine. Electronically Signed   By: Wiliam Ke M.D.   On: 02/17/2023 17:16   CT Cervical Spine Wo Contrast Result Date: 02/17/2023 CLINICAL DATA:  Multiple falls and weakness, head and neck trauma EXAM: CT HEAD WITHOUT CONTRAST CT CERVICAL SPINE WITHOUT CONTRAST TECHNIQUE: Multidetector CT imaging of the head and cervical spine was  performed following the standard protocol without intravenous contrast. Multiplanar CT image reconstructions of the cervical spine were also generated. RADIATION DOSE REDUCTION: This exam was performed according to the departmental dose-optimization program which includes automated exposure control, adjustment of the mA and/or kV according to patient size and/or use of iterative reconstruction technique. COMPARISON:  11/05/2022 CT head, 06/23/2009 CT cervical spine FINDINGS: CT HEAD FINDINGS Brain: No evidence of acute infarct, hemorrhage, mass, mass effect, or midline shift. No hydrocephalus or extra-axial fluid collection. Periventricular white matter changes, likely the sequela of chronic small vessel ischemic disease. Age related cerebral atrophy. Remote lacunar infarct in the left basal ganglia. Vascular: No hyperdense vessel. Atherosclerotic calcifications in the intracranial carotid and vertebral arteries. Skull: Negative for fracture or focal lesion. Sinuses/Orbits: Persistent opacification of the right sphenoid sinus with osseous thickening, consistent with chronic sinusitis. Status post bilateral lens replacements. Other: The mastoid air cells are well aerated. CT CERVICAL SPINE FINDINGS Alignment: No traumatic listhesis. Trace anterolisthesis of C7 on T1, which appears degenerative. Skull base and vertebrae: No acute fracture or suspicious osseous lesion. Posterior fusion C3-C4. Soft tissues and spinal canal: No prevertebral fluid or swelling. No visible canal hematoma. Disc levels: Degenerative changes in the cervical spine.No high-grade spinal canal stenosis. Upper chest: No focal pulmonary opacity or pleural effusion. Emphysema. IMPRESSION: 1. No acute intracranial process. 2. No acute fracture or traumatic listhesis in the cervical spine. Electronically Signed   By: Wiliam Ke M.D.   On: 02/17/2023 17:16   DG Elbow 2 Views Right Result Date: 02/17/2023 CLINICAL DATA:  Fall.  Elbow pain. EXAM:  RIGHT ELBOW - 2 VIEW COMPARISON:  None Available. FINDINGS: No acute fracture or dislocation. No aggressive osseous lesion. Mild degenerative changes of elbow joint. Olecranon enthesophyte noted. No radiopaque foreign bodies. Soft tissues are within normal limits. IMPRESSION: *No acute osseous abnormality of the right elbow. Electronically Signed   By: Jules Schick M.D.   On: 02/17/2023 16:35   MR Brain W Wo Contrast Result Date: 01/30/2023 CLINICAL DATA:  Memory loss. EXAM: MRI HEAD WITHOUT AND WITH CONTRAST TECHNIQUE: Multiplanar, multiecho pulse sequences of the brain and surrounding structures were obtained without and with intravenous contrast. CONTRAST:  9 mL Vueway. COMPARISON:  MRI brain 02/16/2019.  Head CT 11/05/2022. FINDINGS: Brain: No acute infarct or hemorrhage. Stable  background of moderate chronic small-vessel disease with sequela of prior infarct or hemorrhage in the left basal ganglia/corona radiata, and old perforator infarcts in the bilateral thalami. Few foci of chronic microhemorrhage in the bilateral cerebellar hemispheres. No hydrocephalus or extra-axial collection. No mass or midline shift. Faint, punctate foci of enhancement within the posterior aspect of the right superior frontal gyrus (axial image 129 series 119) in the anteromedial aspect of the left cerebellar hemisphere (axial image 32 series 119) without associated signal abnormality on other sequences. No other foci of abnormal enhancement. Vascular: Normal flow voids and vessel enhancement. Skull and upper cervical spine: Normal marrow signal and enhancement. Sinuses/Orbits: No acute findings. Unchanged chronic right sphenoid sinusitis. Other: None. IMPRESSION: 1. Faint, punctate foci of enhancement within the posterior aspect of the right superior frontal gyrus and anteromedial aspect of the left cerebellar hemisphere without associated signal abnormality on other sequences. These are favored to represent late subacute  infarcts. Recommend follow-up MRI in 3 months to ensure resolution. 2. 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 Electronically Signed   By: Orvan Falconer M.D.   On: 01/30/2023 11:35   MR KNEE RIGHT W WO CONTRAST Result Date: 01/17/2023 CLINICAL DATA:  Knee swelling, possible infection EXAM: MRI OF THE RIGHT KNEE WITHOUT AND WITH CONTRAST TECHNIQUE: Multiplanar, multisequence MR imaging of the knee was performed before and after the administration of intravenous contrast. CONTRAST:  6mL GADAVIST GADOBUTROL 1 MMOL/ML IV SOLN COMPARISON:  11/07/2022 FINDINGS: MENISCI Medial meniscus: Worsening of the prior tear, now with a large radial component in the posterior horn adjacent to the meniscal root. Lateral meniscus:  Unremarkable LIGAMENTS Cruciates:  Unremarkable Collaterals:  Unremarkable CARTILAGE Patellofemoral: Severe chondral thinning and chondral irregularity laterally in the patellofemoral joint with mild associated subcortical edema. Marginal spurring. Medial:  Moderate to severe degenerative chondral thinning. Lateral:  Moderate degenerative chondral thinning. Joint: Moderate to large knee joint effusion. Mildly worsened synovitis compared to previous, currently moderate. Thickened medial plica. 0.8 cm curvilinear suspected free osteochondral fragment just posterior to the lateral portion of the medial femoral condyle on image 23 series 4. Popliteal Fossa:  Trace infiltrative edema. Extensor Mechanism:  Unremarkable Bones: Worsening of the subcortical stress fracture or osteochondral lesion anteromedially along the medial femoral condyle with worsened adjacent marrow edema, image 14 series 9. New subcortical stress fracture or osteochondral lesion along the medial tibial plateau with surrounding marrow edema, image d 15 series 9. Progressive  subcortical marrow edema laterally in the patellofemoral joint. Other: No supplemental non-categorized findings. IMPRESSION: 1. Worsening of the prior tear in the posterior horn of the medial meniscus, now with a large radial component in the posterior horn adjacent to the meniscal root. 2. Worsening of the subcortical stress fracture or osteochondral lesion anteromedially along the medial femoral condyle with worsened adjacent marrow edema. New subcortical stress fracture or osteochondral lesion along the medial tibial plateau with surrounding marrow edema. 3. Moderate to large knee joint effusion with mildly worsened synovitis compared to previous, currently moderate. Given the worsening I cannot confidently exclude the possibility of early septic joint, and arthrocentesis may be warranted if there is clinical suspicion. 4. 0.8 cm curvilinear suspected free osteochondral fragment just posterior to the lateral portion of the medial femoral condyle. 5. Moderate to severe tricompartmental osteoarthritis. Electronically Signed   By: Gaylyn Rong M.D.   On: 01/17/2023 08:46  MR Brain W and Wo Contrast Result Date: 02/17/2023 CLINICAL DATA:  Altered mental status EXAM: MRI HEAD WITHOUT AND WITH CONTRAST TECHNIQUE: Multiplanar, multiecho pulse sequences of the brain and surrounding structures were obtained without and with intravenous contrast. CONTRAST:  7mL GADAVIST GADOBUTROL 1 MMOL/ML IV SOLN COMPARISON:  None Available. FINDINGS: Brain: No acute infarct, mass effect or extra-axial collection. Multiple chronic microhemorrhages of the cerebellum and occipital lobes. There is multifocal hyperintense T2-weighted signal within the white matter. Generalized volume loss. 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 The midline structures are normal. There is no abnormal contrast enhancement. Vascular: Normal flow voids. Skull and upper cervical spine: Normal calvarium and skull base. Visualized upper  cervical spine and soft tissues are normal. Sinuses/Orbits:No paranasal sinus fluid levels or advanced mucosal thickening. No mastoid or middle ear effusion. Normal orbits. IMPRESSION: 1. No acute intracranial abnormality. 2. Multiple chronic microhemorrhages of the cerebellum and occipital lobes. 3. 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 Electronically Signed   By: Deatra Robinson M.D.   On: 02/17/2023 22:58   CT Head Wo Contrast Result Date: 02/17/2023 CLINICAL DATA:  Multiple falls and weakness, head and neck trauma EXAM: CT HEAD WITHOUT CONTRAST CT CERVICAL SPINE WITHOUT CONTRAST TECHNIQUE: Multidetector CT imaging of the head and cervical spine was performed following the standard protocol without intravenous contrast. Multiplanar CT image reconstructions of the cervical spine were also generated. RADIATION DOSE REDUCTION: This exam was performed according to the departmental dose-optimization program which includes automated exposure control, adjustment of the mA and/or kV according to patient size and/or use of iterative reconstruction technique. COMPARISON:  11/05/2022 CT head, 06/23/2009 CT cervical spine FINDINGS: CT HEAD FINDINGS Brain: No evidence of acute infarct, hemorrhage, mass, mass effect, or midline shift. No hydrocephalus or extra-axial fluid collection. Periventricular white matter changes, likely the sequela of chronic small vessel ischemic disease. Age related cerebral atrophy. Remote lacunar infarct in the left basal ganglia. Vascular: No hyperdense vessel. Atherosclerotic calcifications in the intracranial carotid and vertebral arteries. Skull: Negative for fracture or focal lesion. Sinuses/Orbits: Persistent opacification of the right sphenoid sinus with osseous thickening, consistent with chronic sinusitis. Status post bilateral lens replacements. Other: The mastoid air cells are well aerated. CT CERVICAL SPINE FINDINGS  Alignment: No traumatic listhesis. Trace anterolisthesis of C7 on T1, which appears degenerative. Skull base and vertebrae: No acute fracture or suspicious osseous lesion. Posterior fusion C3-C4. Soft tissues and spinal canal: No prevertebral fluid or swelling. No visible canal hematoma. Disc levels: Degenerative changes in the cervical spine.No high-grade spinal canal stenosis. Upper chest: No focal pulmonary opacity or pleural effusion. Emphysema. IMPRESSION: 1. No acute intracranial process. 2. No acute fracture or traumatic listhesis in the cervical spine. Electronically Signed   By: Wiliam Ke M.D.   On: 02/17/2023 17:16   CT Cervical Spine Wo Contrast Result Date: 02/17/2023 CLINICAL DATA:  Multiple falls and weakness, head and neck trauma EXAM: CT HEAD WITHOUT CONTRAST CT CERVICAL SPINE WITHOUT CONTRAST TECHNIQUE: Multidetector CT imaging of the head and cervical spine was performed following the standard protocol without intravenous contrast. Multiplanar CT image reconstructions of the cervical spine were also generated. RADIATION DOSE REDUCTION: This exam was performed according to the departmental dose-optimization program which includes automated exposure control, adjustment of the mA and/or kV according to patient size and/or use of iterative reconstruction technique. COMPARISON:  11/05/2022 CT head, 06/23/2009 CT cervical spine FINDINGS: CT HEAD FINDINGS Brain:  No evidence of acute infarct, hemorrhage, mass, mass effect, or midline shift. No hydrocephalus or extra-axial fluid collection. Periventricular white matter changes, likely the sequela of chronic small vessel ischemic disease. Age related cerebral atrophy. Remote lacunar infarct in the left basal ganglia. Vascular: No hyperdense vessel. Atherosclerotic calcifications in the intracranial carotid and vertebral arteries. Skull: Negative for fracture or focal lesion. Sinuses/Orbits: Persistent opacification of the right sphenoid sinus with  osseous thickening, consistent with chronic sinusitis. Status post bilateral lens replacements. Other: The mastoid air cells are well aerated. CT CERVICAL SPINE FINDINGS Alignment: No traumatic listhesis. Trace anterolisthesis of C7 on T1, which appears degenerative. Skull base and vertebrae: No acute fracture or suspicious osseous lesion. Posterior fusion C3-C4. Soft tissues and spinal canal: No prevertebral fluid or swelling. No visible canal hematoma. Disc levels: Degenerative changes in the cervical spine.No high-grade spinal canal stenosis. Upper chest: No focal pulmonary opacity or pleural effusion. Emphysema. IMPRESSION: 1. No acute intracranial process. 2. No acute fracture or traumatic listhesis in the cervical spine. Electronically Signed   By: Wiliam Ke M.D.   On: 02/17/2023 17:16   DG Elbow 2 Views Right Result Date: 02/17/2023 CLINICAL DATA:  Fall.  Elbow pain. EXAM: RIGHT ELBOW - 2 VIEW COMPARISON:  None Available. FINDINGS: No acute fracture or dislocation. No aggressive osseous lesion. Mild degenerative changes of elbow joint. Olecranon enthesophyte noted. No radiopaque foreign bodies. Soft tissues are within normal limits. IMPRESSION: *No acute osseous abnormality of the right elbow. Electronically Signed   By: Jules Schick M.D.   On: 02/17/2023 16:35       Assessment & Plan History of CVA (cerebrovascular accident) Stroke with Residual Cognitive and Mobility Impairments The stroke is likely secondary to a past heart infection. Current symptoms include a well-managed mental status and improved mobility with walker use. No recent falls have been reported. The infection has resolved, and symptoms are attributed to stroke damage. There is a high fall risk due to cognitive impairment. He is resistant to home health services but receives daily support from family and neighbors. Permanent damage was discussed, with potential recovery through brain plasticity. The family is open to home  health services if needed. Encourage the use of a walker and implement home safety modifications. Consider home health services if family support becomes insufficient. Chronic bilateral low back pain with bilateral sciatica Chronic Pain Management Chronic pain is managed with amitriptyline, cyclobenzaprine, and oxycodone. There has been no follow-up from pain management specialists. The risks of pain medication, including dependency and side effects, were discussed. The current oxycodone dosage is 10 mg three times a day, reduced from previous higher doses. Agreed to monthly follow-up visits, with the option for video calls. Reorder amitriptyline 50 mg at bedtime, cyclobenzaprine 10 mg three times a day, and oxycodone 10 mg tablets three times a day for one month. Refer to the pain management clinic in [place] on [address]. Schedule monthly follow-up visits, with the option for video calls. Effusion of right knee joint Resolved, reviewed and agree with Dr. Daiva Eves assessments that this was likely from fall injury rather than sepsis. Memory loss General Health Maintenance He is scheduled for an annual check-up next week. Attend the annual check-up on March 30, 2023.      Orders Placed During this Encounter:   Orders Placed This Encounter  Procedures   Ambulatory referral to Pain Clinic    Referral Priority:   Routine    Referral Type:   Consultation  Referral Reason:   Specialty Services Required    Requested Specialty:   Pain Medicine    Number of Visits Requested:   1   Meds ordered this encounter  Medications   Oxycodone HCl 10 MG TABS    Sig: Take 1 tablet (10 mg total) by mouth every 8 (eight) hours as needed.    Dispense:  90 tablet    Refill:  0   amitriptyline (ELAVIL) 50 MG tablet    Sig: Take 1 tablet (50 mg total) by mouth at bedtime.    Dispense:  90 tablet    Refill:  3   cyclobenzaprine (FLEXERIL) 10 MG tablet    Sig: Take 1 tablet (10 mg total) by mouth 3  (three) times daily as needed.    Dispense:  270 tablet    Refill:  3   Treatment plan discussed and reviewed in detail. Explained medication safety and potential side effects.  Answered all patient questions and confirmed understanding and comfort with the plan. Encouraged patient to contact our office if they have any questions or concerns.  Agreed on patient coming for a sooner office visit if symptoms worsen, persist, or new symptoms develop. Discussed precautions in case of needing to visit the Emergency Department.     ------------------------------------------------------ Attestation:  Today's Healthcare Provider Lula Olszewski, MD was located at office at [facility] [address], [place] [address].  The patient was located at home. All video encounter participant identities and locations confirmed visually and verbally.Today's Telemedicine visit was conducted via synchronous Video after consent for telemedicine was obtained:  Video connection was never lost    This document was transcribed and resynthesized, in part, by artificial intelligence (Abridge) using HIPAA-compliant recording of the clinical interaction;   We have discussed the our use of AI scribe software for clinical note transcription with the patient, who has given verbal consent to proceed.

## 2023-04-06 ENCOUNTER — Ambulatory Visit: Payer: Self-pay

## 2023-04-06 NOTE — Patient Instructions (Signed)
 Visit Information  Thank you for taking time to visit with me today. Please don't hesitate to contact me if I can be of assistance to you.   Following are the goals we discussed today:   Goals Addressed             This Visit's Progress    COMPLETED: Getting Strength back after sepsis       Patient Goals/Self Care Activities: -Patient/Caregiver will take medications as prescribed   -Patient/Caregiver will attend all scheduled provider appointments -Patient/Caregiver will call provider office for new concerns or questions     Spoke with Hadley Pen, he reports patient is better.  He is getting out more with weather getting better.  Encouraged continued independence.  Discussed nurse calls.  Discussed how patient has progressed over the months and meeting goals.  Son [name] is agreeable to close case at this time.          If you are experiencing a Mental Health or Behavioral Health Crisis or need someone to talk to, please call the Suicide and Crisis Lifeline: 988   Patient verbalizes understanding of instructions and care plan provided today and agrees to view in MyChart. Active MyChart status and patient understanding of how to access instructions and care plan via MyChart confirmed with patient.     The patient has been provided with contact information for the care management team and has been advised to call with any health related questions or concerns.   Bary Leriche RN, MSN [facility], [facility] RN Care Manager Direct Dial: [phone]  Fax: [phone] Website: [email or web address]

## 2023-04-06 NOTE — Patient Outreach (Signed)
 Care Coordination   Follow Up Visit Note   04/06/2023 Name: Michael Bender MRN: [id number] DOB: Oct 31, 1945  Michael Bender is a 78 y.o. year old male who sees Lula Olszewski, MD for primary care. I  spoke with son Michael Bender by phone today.    What matters to the patients health and wellness today?  Getting stronger    Goals Addressed             This Visit's Progress    COMPLETED: Getting Strength back after sepsis       Patient Goals/Self Care Activities: -Patient/Caregiver will take medications as prescribed   -Patient/Caregiver will attend all scheduled provider appointments -Patient/Caregiver will call provider office for new concerns or questions     Spoke with Michael Bender, he reports patient is better.  He is getting out more with weather getting better.  Encouraged continued independence.  Discussed nurse calls.  Discussed how patient has progressed over the months and meeting goals.  Son Michael Bender is agreeable to close case at this time.        SDOH assessments and interventions completed:  Yes     Care Coordination Interventions:  Yes, provided   Follow up plan: No further intervention required.   Encounter Outcome:  Patient Visit Completed

## 2023-04-13 ENCOUNTER — Other Ambulatory Visit: Payer: Self-pay | Admitting: Internal Medicine

## 2023-04-13 DIAGNOSIS — I700 Atherosclerosis of aorta: Secondary | ICD-10-CM

## 2023-04-17 ENCOUNTER — Other Ambulatory Visit: Payer: Self-pay | Admitting: Internal Medicine

## 2023-04-19 ENCOUNTER — Other Ambulatory Visit: Payer: Self-pay

## 2023-04-19 ENCOUNTER — Other Ambulatory Visit: Payer: Self-pay | Admitting: Internal Medicine

## 2023-04-19 ENCOUNTER — Telehealth: Payer: Medicare HMO | Admitting: Internal Medicine

## 2023-04-19 ENCOUNTER — Encounter: Payer: Self-pay | Admitting: Internal Medicine

## 2023-04-19 DIAGNOSIS — R0602 Shortness of breath: Secondary | ICD-10-CM

## 2023-04-19 DIAGNOSIS — J449 Chronic obstructive pulmonary disease, unspecified: Secondary | ICD-10-CM

## 2023-04-19 MED ORDER — NEBULIZER/TUBING/MOUTHPIECE KIT: 1.0000 | PACK | 1 refills | Status: DC | PRN

## 2023-04-19 MED ORDER — NEBULIZER/TUBING/MOUTHPIECE KIT: 1.0000 | PACK | 1 refills | Status: AC | PRN

## 2023-04-19 MED ORDER — IPRATROPIUM-ALBUTEROL 0.5-2.5 (3) MG/3ML IN SOLN: 3.0000 mL | RESPIRATORY_TRACT | 1 refills | Status: DC | PRN

## 2023-04-19 MED ORDER — IPRATROPIUM-ALBUTEROL 0.5-2.5 (3) MG/3ML IN SOLN
3.0000 mL | RESPIRATORY_TRACT | 1 refills | Status: AC | PRN
Start: 2023-04-19 — End: ?

## 2023-04-19 NOTE — Progress Notes (Signed)
 ==============================  [facility]: [phone]   --  Virtual Video Medical Office Visit --  Patient: Michael Bender      Age: 78 y.o.       Sex:  male  Date:   04/19/2023 Today's Healthcare Provider: Lula Olszewski, MD  ==============================  CHIEF COMPLAINT: Follow-up "Can't walk 12 feet and I run out of air"  SUBJECTIVE: History of Present Illness The patient is an older adult who presents via video visit with acute onset shortness of breath developing over the past three days, worsening rapidly during a 24-hour period. He reports significant dyspnea with minimal exertion (walking 10-15 feet) and experiences shortness of breath even at rest. Despite recommendations for emergency evaluation, the patient declined to go to the emergency department.  The patient denies chest pain, fever, or leg swelling. When questioned about recent changes to routine, he recalls using an inhaler on the day his symptoms began, though reports he has not used his inhalers regularly and is unsure if they have expired.  Past medical history is significant for bloodstream infection (MSSA bacteremia) approximately one year ago, which was suspected to have caused heart valve damage potentially leading to a subsequent stroke. The patient has some memory deficits following the stroke and does not fully recall details of this medical history.  Medication reconciliation reveals the patient is not currently taking his prescribed metoprolol or aspirin, though he does have rosuvastatin available. He is on multiple other medications including amlodipine, amitriptyline, cefadroxil, and oxycodone.  On limited video assessment, the patient appears well-developed and well-nourished without acute distress. He demonstrates increased respiratory effort with ambulation but returns to baseline after approximately 30 seconds of rest. No cyanosis or lower extremity edema is  visible. Heart rate is 92 bpm. The patient is alert, articulate, and demonstrates normal cognitive function during the encounter.  Recent laboratory findings show chronic kidney disease (eGFR 39, creatinine 1.77), chronic anemia (hemoglobin 9.4), and mild inflammatory markers (CRP 11.6, ESR 19). Imaging from January 2025 demonstrates multiple chronic microhemorrhages of the cerebellum and occipital lobes, with old left basal ganglia and thalamic small vessel infarcts consistent with chronic small vessel ischemia. CT imaging from the same period also shows evidence of emphysema.   Medications reviewed Current Outpatient Medications on File Prior to Visit  Medication Sig   amitriptyline (ELAVIL) 50 MG tablet Take 1 tablet (50 mg total) by mouth at bedtime.   amLODipine (NORVASC) 10 MG tablet Take 1 tablet (10 mg total) by mouth daily.   cefadroxil (DURICEF) 500 MG capsule Take 2 capsules (1,000 mg total) by mouth 2 (two) times daily.   cyclobenzaprine (FLEXERIL) 10 MG tablet Take 1 tablet (10 mg total) by mouth 3 (three) times daily as needed.   metoprolol tartrate (LOPRESSOR) 25 MG tablet Take 1 tablet (25 mg total) by mouth 2 (two) times daily.   Oxycodone HCl 10 MG TABS Take 1 tablet (10 mg total) by mouth every 8 (eight) hours as needed.   pantoprazole (PROTONIX) 40 MG tablet TAKE 1 TABLET BY MOUTH EVERY DAY BEFORE BREAKFAST   polyethylene glycol (MIRALAX / GLYCOLAX) 17 g packet Take 17 g by mouth daily as needed for moderate constipation.   potassium chloride SA (KLOR-CON M) 20 MEQ tablet Take 1 tablet (20 mEq total) by mouth 2 (two) times daily.   rosuvastatin (CRESTOR) 40 MG tablet TAKE 1 TABLET BY MOUTH DAILY. REPLACES ATORVASTATIN (STOP ATORVASTATIN IF STILL TAKING)   SUMAtriptan (IMITREX) 50  MG tablet TAKE 1 TABLET (50 MG TOTAL) BY MOUTH DAILY. MAY REPEAT IN 2 HOURS IF HEADACHE PERSISTS OR RECURS.   No current facility-administered medications on file prior to visit.  There are no  discontinued medications.  OBJECTIVE:Objective  Physical Exam VITALS: P- 92 EXTREMITIES: No cyanosis, edema, or tenderness in the legs. Gets winded with walking 10-15 feet but no increase WOB after about 30 secs after sitting back down and resting, though he does feel shortness of breath.   General Appearance:  Well Developed, Well Nourished, No Acute Distress by Limited Video Assessment Pulmonary:  No Respiratory Distress Apparent. Normal Work of Breathing.   Neurological:  Awake, Alert. No Obvious Focal Neurological Deficits or Cognitive Impairments.  Sensorium Seems Unclouded. Psychiatric:  Appropriate Mood, Pleasant Demeanor, Calm, Articulate, Good Mood         No results found for any visits on 04/19/23. Office Visit on 02/22/2023  Component Date Value   Sed Rate 02/22/2023 19    CRP 02/22/2023 11.6 (H)    WBC 02/22/2023 6.1    RBC 02/22/2023 3.05 (L)    Hemoglobin 02/22/2023 9.4 (L)    HCT 02/22/2023 29.0 (L)    MCV 02/22/2023 95.1    MCH 02/22/2023 30.8    MCHC 02/22/2023 32.4    RDW 02/22/2023 12.4    Platelets 02/22/2023 166    MPV 02/22/2023 9.8    Neutro Abs 02/22/2023 3,239    Absolute Lymphocytes 02/22/2023 2,129    Absolute Monocytes 02/22/2023 561    Eosinophils Absolute 02/22/2023 153    Basophils Absolute 02/22/2023 18    Neutrophils Relative % 02/22/2023 53.1    Total Lymphocyte 02/22/2023 34.9    Monocytes Relative 02/22/2023 9.2    Eosinophils Relative 02/22/2023 2.5    Basophils Relative 02/22/2023 0.3    Glucose, Bld 02/22/2023 94    BUN 02/22/2023 20    Creat 02/22/2023 1.77 (H)    eGFR 02/22/2023 39 (L)    BUN/Creatinine Ratio 02/22/2023 11    Sodium 02/22/2023 140    Potassium 02/22/2023 3.1 (L)    Chloride 02/22/2023 104    CO2 02/22/2023 26    Calcium 02/22/2023 8.4 (L)    Total Protein 02/22/2023 6.3    Albumin 02/22/2023 3.4 (L)    Globulin 02/22/2023 2.9    AG Ratio 02/22/2023 1.2    Total Bilirubin 02/22/2023 0.3    Alkaline  phosphatase (AP* 02/22/2023 92    AST 02/22/2023 21    ALT 02/22/2023 6 (L)   Admission on 02/17/2023, Discharged on 02/17/2023  Component Date Value   Color, Urine 02/17/2023 YELLOW    APPearance 02/17/2023 CLEAR    Specific Gravity, Urine 02/17/2023 1.011    pH 02/17/2023 5.0    Glucose, UA 02/17/2023 NEGATIVE    Hgb urine dipstick 02/17/2023 MODERATE (A)    Bilirubin Urine 02/17/2023 NEGATIVE    Ketones, ur 02/17/2023 NEGATIVE    Protein, ur 02/17/2023 30 (A)    Nitrite 02/17/2023 NEGATIVE    Leukocytes,Ua 02/17/2023 NEGATIVE    RBC / HPF 02/17/2023 0-5    WBC, UA 02/17/2023 0-5    Bacteria, UA 02/17/2023 NONE SEEN    Squamous Epithelial / HPF 02/17/2023 0-5    Mucus 02/17/2023 PRESENT    Glucose-Capillary 02/17/2023 164 (H)    WBC 02/17/2023 5.0    RBC 02/17/2023 2.85 (L)    Hemoglobin 02/17/2023 8.9 (L)    HCT 02/17/2023 27.3 (L)    MCV 02/17/2023 95.8    MCH  02/17/2023 31.2    MCHC 02/17/2023 32.6    RDW 02/17/2023 13.2    Platelets 02/17/2023 141 (L)    nRBC 02/17/2023 0.0    Neutrophils Relative % 02/17/2023 65    Neutro Abs 02/17/2023 3.2    Lymphocytes Relative 02/17/2023 24    Lymphs Abs 02/17/2023 1.2    Monocytes Relative 02/17/2023 10    Monocytes Absolute 02/17/2023 0.5    Eosinophils Relative 02/17/2023 1    Eosinophils Absolute 02/17/2023 0.1    Basophils Relative 02/17/2023 0    Basophils Absolute 02/17/2023 0.0    Immature Granulocytes 02/17/2023 0    Abs Immature Granulocytes 02/17/2023 0.01    Sodium 02/17/2023 139    Potassium 02/17/2023 3.6    Chloride 02/17/2023 105    CO2 02/17/2023 23    Glucose, Bld 02/17/2023 188 (H)    BUN 02/17/2023 26 (H)    Creatinine, Ser 02/17/2023 2.10 (H)    Calcium 02/17/2023 8.5 (L)    Total Protein 02/17/2023 6.3 (L)    Albumin 02/17/2023 3.1 (L)    AST 02/17/2023 32    ALT 02/17/2023 11    Alkaline Phosphatase 02/17/2023 79    Total Bilirubin 02/17/2023 0.5    GFR, Estimated 02/17/2023 32 (L)    Anion  gap 02/17/2023 11    Sed Rate 02/17/2023 18 (H)    CRP 02/17/2023 0.8   Office Visit on 01/17/2023  Component Date Value   CRP 01/17/2023 5.6    Sed Rate 01/17/2023 36 (H)    Glucose, Bld 01/17/2023 167 (H)    BUN 01/17/2023 21    Creat 01/17/2023 1.71 (H)    eGFR 01/17/2023 40 (L)    BUN/Creatinine Ratio 01/17/2023 12    Sodium 01/17/2023 141    Potassium 01/17/2023 4.7    Chloride 01/17/2023 107    CO2 01/17/2023 26    Calcium 01/17/2023 8.7    Total Protein 01/17/2023 6.6    Albumin 01/17/2023 3.7    Globulin 01/17/2023 2.9    AG Ratio 01/17/2023 1.3    Total Bilirubin 01/17/2023 0.4    Alkaline phosphatase (AP* 01/17/2023 106    AST 01/17/2023 19    ALT 01/17/2023 6 (L)    WBC 01/17/2023 5.6    RBC 01/17/2023 2.69 (L)    Hemoglobin 01/17/2023 8.3 (L)    HCT 01/17/2023 26.1 (L)    MCV 01/17/2023 97.0    MCH 01/17/2023 30.9    MCHC 01/17/2023 31.8 (L)    RDW 01/17/2023 13.1    Platelets 01/17/2023 181    MPV 01/17/2023 10.4    Neutro Abs 01/17/2023 2,638    Absolute Lymphocytes 01/17/2023 2,050    Absolute Monocytes 01/17/2023 521    Eosinophils Absolute 01/17/2023 353    Basophils Absolute 01/17/2023 39    Neutrophils Relative % 01/17/2023 47.1    Total Lymphocyte 01/17/2023 36.6    Monocytes Relative 01/17/2023 9.3    Eosinophils Relative 01/17/2023 6.3    Basophils Relative 01/17/2023 0.7   Lab on 01/02/2023  Component Date Value   WBC 01/02/2023 6.1    RBC 01/02/2023 2.56 (L)    Hemoglobin 01/02/2023 7.9 (L)    HCT 01/02/2023 25.2 (L)    MCV 01/02/2023 98.4    MCH 01/02/2023 30.9    MCHC 01/02/2023 31.3    RDW 01/02/2023 14.1    Platelets 01/02/2023 214    nRBC 01/02/2023 0.0    Neutrophils Relative % 01/02/2023 58    Neutro Abs 01/02/2023  3.5    Lymphocytes Relative 01/02/2023 29    Lymphs Abs 01/02/2023 1.7    Monocytes Relative 01/02/2023 10    Monocytes Absolute 01/02/2023 0.6    Eosinophils Relative 01/02/2023 3    Eosinophils Absolute  01/02/2023 0.2    Basophils Relative 01/02/2023 0    Basophils Absolute 01/02/2023 0.0    Immature Granulocytes 01/02/2023 0    Abs Immature Granulocytes 01/02/2023 0.02   Office Visit on 12/28/2022  Component Date Value   CRP 12/28/2022 32.2 (H)    Sed Rate 12/28/2022 67 (H)    Glucose, Bld 12/28/2022 135 (H)    BUN 12/28/2022 27 (H)    Creat 12/28/2022 1.79 (H)    eGFR 12/28/2022 38 (L)    BUN/Creatinine Ratio 12/28/2022 15    Sodium 12/28/2022 139    Potassium 12/28/2022 5.1    Chloride 12/28/2022 113 (H)    CO2 12/28/2022 18 (L)    Calcium 12/28/2022 8.5 (L)    Total Protein 12/28/2022 6.7    Albumin 12/28/2022 3.5 (L)    Globulin 12/28/2022 3.2    AG Ratio 12/28/2022 1.1    Total Bilirubin 12/28/2022 0.2    Alkaline phosphatase (AP* 12/28/2022 118    AST 12/28/2022 12    ALT 12/28/2022 <3 (L)    WBC 12/28/2022 5.6    RBC 12/28/2022 2.30 (L)    Hemoglobin 12/28/2022 7.1 (L)    HCT 12/28/2022 22.2 (L)    MCV 12/28/2022 96.5    MCH 12/28/2022 30.9    MCHC 12/28/2022 32.0    RDW 12/28/2022 13.1    Platelets 12/28/2022 205    MPV 12/28/2022 9.4    Neutro Abs 12/28/2022 3,875    Absolute Lymphocytes 12/28/2022 1,042    Absolute Monocytes 12/28/2022 465    Eosinophils Absolute 12/28/2022 196    Basophils Absolute 12/28/2022 22    Neutrophils Relative % 12/28/2022 69.2    Total Lymphocyte 12/28/2022 18.6    Monocytes Relative 12/28/2022 8.3    Eosinophils Relative 12/28/2022 3.5    Basophils Relative 12/28/2022 0.4   No results displayed because visit has over 200 results.    Admission on 10/27/2022, Discharged on 11/02/2022  Component Date Value   WBC 10/27/2022 5.4    RBC 10/27/2022 3.37 (L)    Hemoglobin 10/27/2022 10.5 (L)    HCT 10/27/2022 30.7 (L)    MCV 10/27/2022 91.1    MCH 10/27/2022 31.2    MCHC 10/27/2022 34.2    RDW 10/27/2022 13.2    Platelets 10/27/2022 133 (L)    nRBC 10/27/2022 0.0    Color, Urine 10/27/2022 YELLOW    APPearance 10/27/2022  CLEAR    Specific Gravity, Urine 10/27/2022 1.010    pH 10/27/2022 6.0    Glucose, UA 10/27/2022 NEGATIVE    Hgb urine dipstick 10/27/2022 LARGE (A)    Bilirubin Urine 10/27/2022 NEGATIVE    Ketones, ur 10/27/2022 NEGATIVE    Protein, ur 10/27/2022 100 (A)    Nitrite 10/27/2022 NEGATIVE    Leukocytes,Ua 10/27/2022 NEGATIVE    Sodium 10/27/2022 134 (L)    Potassium 10/27/2022 2.1 (LL)    Chloride 10/27/2022 99    CO2 10/27/2022 24    Glucose, Bld 10/27/2022 126 (H)    BUN 10/27/2022 26 (H)    Creatinine, Ser 10/27/2022 4.21 (H)    Calcium 10/27/2022 7.4 (L)    Total Protein 10/27/2022 6.5    Albumin 10/27/2022 2.9 (L)    AST 10/27/2022 126 (H)  ALT 10/27/2022 49 (H)    Alkaline Phosphatase 10/27/2022 153 (H)    Total Bilirubin 10/27/2022 0.2 (L)    GFR, Estimated 10/27/2022 14 (L)    Anion gap 10/27/2022 11    Magnesium 10/27/2022 1.9    RBC / HPF 10/27/2022 11-20    WBC, UA 10/27/2022 0-5    Bacteria, UA 10/27/2022 NONE SEEN    Squamous Epithelial / HPF 10/27/2022 0-5    Total CK 10/27/2022 2,828 (H)    Sodium 10/28/2022 138    Potassium 10/28/2022 2.6 (LL)    Chloride 10/28/2022 104    CO2 10/28/2022 20 (L)    Glucose, Bld 10/28/2022 82    BUN 10/28/2022 25 (H)    Creatinine, Ser 10/28/2022 3.62 (H)    Calcium 10/28/2022 7.6 (L)    GFR, Estimated 10/28/2022 17 (L)    Anion gap 10/28/2022 14    Magnesium 10/28/2022 2.5 (H)    Phosphorus 10/28/2022 3.7    Total CK 10/28/2022 2,043 (H)    Total Protein 10/28/2022 5.5 (L)    Albumin 10/28/2022 2.4 (L)    AST 10/28/2022 95 (H)    ALT 10/28/2022 39    Alkaline Phosphatase 10/28/2022 134 (H)    Total Bilirubin 10/28/2022 0.5    Bilirubin, Direct 10/28/2022 0.2    Indirect Bilirubin 10/28/2022 0.3    Vitamin B-12 10/28/2022 676    Sodium 10/29/2022 142    Potassium 10/29/2022 3.2 (L)    Chloride 10/29/2022 106    CO2 10/29/2022 24    Glucose, Bld 10/29/2022 147 (H)    BUN 10/29/2022 24 (H)    Creatinine, Ser  10/29/2022 3.19 (H)    Calcium 10/29/2022 7.8 (L)    GFR, Estimated 10/29/2022 19 (L)    Anion gap 10/29/2022 12    Total CK 10/29/2022 992 (H)    Folate 10/30/2022 13.1    Iron 10/30/2022 46    TIBC 10/30/2022 223 (L)    Saturation Ratios 10/30/2022 21    UIBC 10/30/2022 177    Ferritin 10/30/2022 224    Retic Ct Pct 10/30/2022 0.6    RBC. 10/30/2022 3.41 (L)    Retic Count, Absolute 10/30/2022 19.8    Immature Retic Fract 10/30/2022 1.8 (L)    Sodium 10/30/2022 143    Potassium 10/30/2022 3.6    Chloride 10/30/2022 101    CO2 10/30/2022 27    Glucose, Bld 10/30/2022 183 (H)    BUN 10/30/2022 17    Creatinine, Ser 10/30/2022 2.28 (H)    Calcium 10/30/2022 8.2 (L)    GFR, Estimated 10/30/2022 29 (L)    Anion gap 10/30/2022 15    Total CK 10/30/2022 802 (H)    WBC 10/31/2022 4.5    RBC 10/31/2022 3.53 (L)    Hemoglobin 10/31/2022 11.3 (L)    HCT 10/31/2022 32.5 (L)    MCV 10/31/2022 92.1    MCH 10/31/2022 32.0    MCHC 10/31/2022 34.8    RDW 10/31/2022 13.1    Platelets 10/31/2022 144 (L)    nRBC 10/31/2022 0.0    Sodium 10/31/2022 140    Potassium 10/31/2022 3.6    Chloride 10/31/2022 104    CO2 10/31/2022 28    Glucose, Bld 10/31/2022 124 (H)    BUN 10/31/2022 13    Creatinine, Ser 10/31/2022 2.13 (H)    Calcium 10/31/2022 8.1 (L)    Total Protein 10/31/2022 5.8 (L)    Albumin 10/31/2022 2.5 (L)    AST 10/31/2022 114 (H)  ALT 10/31/2022 37    Alkaline Phosphatase 10/31/2022 130 (H)    Total Bilirubin 10/31/2022 0.8    GFR, Estimated 10/31/2022 31 (L)    Anion gap 10/31/2022 8    Total CK 10/31/2022 818 (H)    Total CK 11/01/2022 482 (H)    Sodium 11/01/2022 139    Potassium 11/01/2022 2.6 (LL)    Chloride 11/01/2022 98    CO2 11/01/2022 28    Glucose, Bld 11/01/2022 128 (H)    BUN 11/01/2022 11    Creatinine, Ser 11/01/2022 1.65 (H)    Calcium 11/01/2022 8.0 (L)    Total Protein 11/01/2022 5.6 (L)    Albumin 11/01/2022 2.4 (L)    AST 11/01/2022 146  (H)    ALT 11/01/2022 43    Alkaline Phosphatase 11/01/2022 121    Total Bilirubin 11/01/2022 0.2 (L)    GFR, Estimated 11/01/2022 43 (L)    Anion gap 11/01/2022 13    Magnesium 11/01/2022 1.3 (L)    Potassium 11/01/2022 2.7 (LL)    Magnesium 11/01/2022 2.4    Potassium 11/01/2022 3.6    Sodium 11/02/2022 138    Potassium 11/02/2022 3.1 (L)    Chloride 11/02/2022 104    CO2 11/02/2022 25    Glucose, Bld 11/02/2022 195 (H)    BUN 11/02/2022 9    Creatinine, Ser 11/02/2022 1.61 (H)    Calcium 11/02/2022 7.9 (L)    Total Protein 11/02/2022 5.7 (L)    Albumin 11/02/2022 2.3 (L)    AST 11/02/2022 84 (H)    ALT 11/02/2022 37    Alkaline Phosphatase 11/02/2022 117    Total Bilirubin 11/02/2022 0.3    GFR, Estimated 11/02/2022 44 (L)    Anion gap 11/02/2022 9   Office Visit on 05/03/2022  Component Date Value   Amphetamines, Urine 05/03/2022 Negative    Cannabinoid Quant, Ur 05/03/2022 Negative    Cocaine (Metab.) 05/03/2022 Negative    OPIATE QUANTITATIVE URINE 05/03/2022 Negative    PCP Quant, Ur 05/03/2022 Negative    WBC 05/03/2022 7.9    RBC 05/03/2022 4.27    Hemoglobin 05/03/2022 13.5    HCT 05/03/2022 39.9    MCV 05/03/2022 93.6    MCHC 05/03/2022 33.8    RDW 05/03/2022 13.9    Platelets 05/03/2022 223.0    Neutrophils Relative % 05/03/2022 58.4    Lymphocytes Relative 05/03/2022 28.0    Monocytes Relative 05/03/2022 8.8    Eosinophils Relative 05/03/2022 4.3    Basophils Relative 05/03/2022 0.5    Neutro Abs 05/03/2022 4.6    Lymphs Abs 05/03/2022 2.2    Monocytes Absolute 05/03/2022 0.7    Eosinophils Absolute 05/03/2022 0.3    Basophils Absolute 05/03/2022 0.0    Sodium 05/03/2022 138    Potassium 05/03/2022 3.5    Chloride 05/03/2022 101    CO2 05/03/2022 32    Glucose, Bld 05/03/2022 110 (H)    BUN 05/03/2022 14    Creatinine, Ser 05/03/2022 1.06    Total Bilirubin 05/03/2022 0.4    Alkaline Phosphatase 05/03/2022 83    AST 05/03/2022 48 (H)    ALT  05/03/2022 20    Total Protein 05/03/2022 6.5    Albumin 05/03/2022 3.7    GFR 05/03/2022 67.73    Calcium 05/03/2022 8.6    Cholesterol 05/03/2022 93    Triglycerides 05/03/2022 119.0    HDL 05/03/2022 56.30    VLDL 05/03/2022 23.8    LDL Cholesterol 05/03/2022 13    Total CHOL/HDL Ratio  05/03/2022 2    NonHDL 05/03/2022 36.54    Hgb A1c MFr Bld 05/03/2022 6.4    Microalb, Ur 05/03/2022 5.4 (H)    Creatinine,U 05/03/2022 33.0    Microalb Creat Ratio 05/03/2022 16.2    TSH 05/03/2022 1.88    HIV 1&2 Ab, 4th Generati* 05/03/2022 NON-REACTIVE    Sed Rate 05/03/2022 16    CRP 05/03/2022 <1.0   No image results found. MR Brain W and Wo Contrast Result Date: 02/17/2023 CLINICAL DATA:  Altered mental status EXAM: MRI HEAD WITHOUT AND WITH CONTRAST TECHNIQUE: Multiplanar, multiecho pulse sequences of the brain and surrounding structures were obtained without and with intravenous contrast. CONTRAST:  7mL GADAVIST GADOBUTROL 1 MMOL/ML IV SOLN COMPARISON:  None Available. FINDINGS: Brain: No acute infarct, mass effect or extra-axial collection. Multiple chronic microhemorrhages of the cerebellum and occipital lobes. There is multifocal hyperintense T2-weighted signal within the white matter. Generalized volume loss. 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 The midline structures are normal. There is no abnormal contrast enhancement. Vascular: Normal flow voids. Skull and upper cervical spine: Normal calvarium and skull base. Visualized upper cervical spine and soft tissues are normal. Sinuses/Orbits:No paranasal sinus fluid levels or advanced mucosal thickening. No mastoid or middle ear effusion. Normal orbits. IMPRESSION: 1. No acute intracranial abnormality. 2. Multiple chronic microhemorrhages of the cerebellum and occipital lobes. 3. 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 Electronically Signed   By: Deatra Robinson M.D.    On: 02/17/2023 22:58   CT Head Wo Contrast Result Date: 02/17/2023 CLINICAL DATA:  Multiple falls and weakness, head and neck trauma EXAM: CT HEAD WITHOUT CONTRAST CT CERVICAL SPINE WITHOUT CONTRAST TECHNIQUE: Multidetector CT imaging of the head and cervical spine was performed following the standard protocol without intravenous contrast. Multiplanar CT image reconstructions of the cervical spine were also generated. RADIATION DOSE REDUCTION: This exam was performed according to the departmental dose-optimization program which includes automated exposure control, adjustment of the mA and/or kV according to patient size and/or use of iterative reconstruction technique. COMPARISON:  11/05/2022 CT head, 06/23/2009 CT cervical spine FINDINGS: CT HEAD FINDINGS Brain: No evidence of acute infarct, hemorrhage, mass, mass effect, or midline shift. No hydrocephalus or extra-axial fluid collection. Periventricular white matter changes, likely the sequela of chronic small vessel ischemic disease. Age related cerebral atrophy. Remote lacunar infarct in the left basal ganglia. Vascular: No hyperdense vessel. Atherosclerotic calcifications in the intracranial carotid and vertebral arteries. Skull: Negative for fracture or focal lesion. Sinuses/Orbits: Persistent opacification of the right sphenoid sinus with osseous thickening, consistent with chronic sinusitis. Status post bilateral lens replacements. Other: The mastoid air cells are well aerated. CT CERVICAL SPINE FINDINGS Alignment: No traumatic listhesis. Trace anterolisthesis of C7 on T1, which appears degenerative. Skull base and vertebrae: No acute fracture or suspicious osseous lesion. Posterior fusion C3-C4. Soft tissues and spinal canal: No prevertebral fluid or swelling. No visible canal hematoma. Disc levels: Degenerative changes in the cervical spine.No high-grade spinal canal stenosis. Upper chest: No focal pulmonary opacity or pleural effusion. Emphysema.  IMPRESSION: 1. No acute intracranial process. 2. No acute fracture or traumatic listhesis in the cervical spine. Electronically Signed   By: Wiliam Ke M.D.   On: 02/17/2023 17:16   CT Cervical Spine Wo Contrast Result Date: 02/17/2023 CLINICAL DATA:  Multiple falls and weakness, head and neck trauma EXAM: CT HEAD WITHOUT CONTRAST CT CERVICAL SPINE WITHOUT CONTRAST TECHNIQUE: Multidetector CT imaging  of the head and cervical spine was performed following the standard protocol without intravenous contrast. Multiplanar CT image reconstructions of the cervical spine were also generated. RADIATION DOSE REDUCTION: This exam was performed according to the departmental dose-optimization program which includes automated exposure control, adjustment of the mA and/or kV according to patient size and/or use of iterative reconstruction technique. COMPARISON:  11/05/2022 CT head, 06/23/2009 CT cervical spine FINDINGS: CT HEAD FINDINGS Brain: No evidence of acute infarct, hemorrhage, mass, mass effect, or midline shift. No hydrocephalus or extra-axial fluid collection. Periventricular white matter changes, likely the sequela of chronic small vessel ischemic disease. Age related cerebral atrophy. Remote lacunar infarct in the left basal ganglia. Vascular: No hyperdense vessel. Atherosclerotic calcifications in the intracranial carotid and vertebral arteries. Skull: Negative for fracture or focal lesion. Sinuses/Orbits: Persistent opacification of the right sphenoid sinus with osseous thickening, consistent with chronic sinusitis. Status post bilateral lens replacements. Other: The mastoid air cells are well aerated. CT CERVICAL SPINE FINDINGS Alignment: No traumatic listhesis. Trace anterolisthesis of C7 on T1, which appears degenerative. Skull base and vertebrae: No acute fracture or suspicious osseous lesion. Posterior fusion C3-C4. Soft tissues and spinal canal: No prevertebral fluid or swelling. No visible canal  hematoma. Disc levels: Degenerative changes in the cervical spine.No high-grade spinal canal stenosis. Upper chest: No focal pulmonary opacity or pleural effusion. Emphysema. IMPRESSION: 1. No acute intracranial process. 2. No acute fracture or traumatic listhesis in the cervical spine. Electronically Signed   By: Wiliam Ke M.D.   On: 02/17/2023 17:16   DG Elbow 2 Views Right Result Date: 02/17/2023 CLINICAL DATA:  Fall.  Elbow pain. EXAM: RIGHT ELBOW - 2 VIEW COMPARISON:  None Available. FINDINGS: No acute fracture or dislocation. No aggressive osseous lesion. Mild degenerative changes of elbow joint. Olecranon enthesophyte noted. No radiopaque foreign bodies. Soft tissues are within normal limits. IMPRESSION: *No acute osseous abnormality of the right elbow. Electronically Signed   By: Jules Schick M.D.   On: 02/17/2023 16:35   MR Brain W Wo Contrast Result Date: 01/30/2023 CLINICAL DATA:  Memory loss. EXAM: MRI HEAD WITHOUT AND WITH CONTRAST TECHNIQUE: Multiplanar, multiecho pulse sequences of the brain and surrounding structures were obtained without and with intravenous contrast. CONTRAST:  9 mL Vueway. COMPARISON:  MRI brain 02/16/2019.  Head CT 11/05/2022. FINDINGS: Brain: No acute infarct or hemorrhage. 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 Few foci of chronic microhemorrhage in the bilateral cerebellar hemispheres. No hydrocephalus or extra-axial collection. No mass or midline shift. Faint, punctate foci of enhancement within the posterior aspect of the right superior frontal gyrus (axial image 129 series 119) in the anteromedial aspect of the left cerebellar hemisphere (axial image 32 series 119) without associated signal abnormality on other sequences. No other foci of abnormal enhancement. Vascular: Normal flow voids and vessel  enhancement. Skull and upper cervical spine: Normal marrow signal and enhancement. Sinuses/Orbits: No acute findings. Unchanged chronic right sphenoid sinusitis. Other: None. IMPRESSION: 1. Faint, punctate foci of enhancement within the posterior aspect of the right superior frontal gyrus and anteromedial aspect of the left cerebellar hemisphere without associated signal abnormality on other sequences. These are favored to represent late subacute infarcts. Recommend follow-up MRI in 3 months to ensure resolution. 2. Stable background of moderate chronic small-vessel disease with sequela of prior infarct or hemorrhage in the left basal ganglia/corona radiata, and old  perforator infarcts in the bilateral thalami. Electronically Signed   By: Orvan Falconer M.D.   On: 01/30/2023 11:35  MR Brain W and Wo Contrast Result Date: 02/17/2023 CLINICAL DATA:  Altered mental status EXAM: MRI HEAD WITHOUT AND WITH CONTRAST TECHNIQUE: Multiplanar, multiecho pulse sequences of the brain and surrounding structures were obtained without and with intravenous contrast. CONTRAST:  7mL GADAVIST GADOBUTROL 1 MMOL/ML IV SOLN COMPARISON:  None Available. FINDINGS: Brain: No acute infarct, mass effect or extra-axial collection. Multiple chronic microhemorrhages of the cerebellum and occipital lobes. There is multifocal hyperintense T2-weighted signal within the white matter. Generalized volume loss. 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 The midline structures are normal. There is no abnormal contrast enhancement. Vascular: Normal flow voids. Skull and upper cervical spine: Normal calvarium and skull base. Visualized upper cervical spine and soft tissues are normal. Sinuses/Orbits:No paranasal sinus fluid levels or advanced mucosal thickening. No mastoid or middle ear effusion. Normal orbits. IMPRESSION: 1. No acute intracranial abnormality. 2. Multiple chronic microhemorrhages of the cerebellum and occipital lobes. 3.  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 Electronically Signed   By: Deatra Robinson M.D.   On: 02/17/2023 22:58   CT Head Wo Contrast Result Date: 02/17/2023 CLINICAL DATA:  Multiple falls and weakness, head and neck trauma EXAM: CT HEAD WITHOUT CONTRAST CT CERVICAL SPINE WITHOUT CONTRAST TECHNIQUE: Multidetector CT imaging of the head and cervical spine was performed following the standard protocol without intravenous contrast. Multiplanar CT image reconstructions of the cervical spine were also generated. RADIATION DOSE REDUCTION: This exam was performed according to the departmental dose-optimization program which includes automated exposure control, adjustment of the mA and/or kV according to patient size and/or use of iterative reconstruction technique. COMPARISON:  11/05/2022 CT head, 06/23/2009 CT cervical spine FINDINGS: CT HEAD FINDINGS Brain: No evidence of acute infarct, hemorrhage, mass, mass effect, or midline shift. No hydrocephalus or extra-axial fluid collection. Periventricular white matter changes, likely the sequela of chronic small vessel ischemic disease. Age related cerebral atrophy. Remote lacunar infarct in the left basal ganglia. Vascular: No hyperdense vessel. Atherosclerotic calcifications in the intracranial carotid and vertebral arteries. Skull: Negative for fracture or focal lesion. Sinuses/Orbits: Persistent opacification of the right sphenoid sinus with osseous thickening, consistent with chronic sinusitis. Status post bilateral lens replacements. Other: The mastoid air cells are well aerated. CT CERVICAL SPINE FINDINGS Alignment: No traumatic listhesis. Trace anterolisthesis of C7 on T1, which appears degenerative. Skull base and vertebrae: No acute fracture or suspicious osseous lesion. Posterior fusion C3-C4. Soft tissues and spinal canal: No prevertebral fluid or swelling. No visible canal hematoma. Disc levels:  Degenerative changes in the cervical spine.No high-grade spinal canal stenosis. Upper chest: No focal pulmonary opacity or pleural effusion. Emphysema. IMPRESSION: 1. No acute intracranial process. 2. No acute fracture or traumatic listhesis in the cervical spine. Electronically Signed   By: Wiliam Ke M.D.   On: 02/17/2023 17:16   CT Cervical Spine Wo Contrast Result Date: 02/17/2023 CLINICAL DATA:  Multiple falls and weakness, head and neck trauma EXAM: CT HEAD WITHOUT CONTRAST CT CERVICAL SPINE WITHOUT CONTRAST TECHNIQUE: Multidetector CT imaging of the head and cervical spine was performed following the standard protocol without intravenous contrast. Multiplanar CT image reconstructions of the cervical spine were also generated. RADIATION DOSE REDUCTION: This exam was performed according to the departmental dose-optimization program which includes automated exposure control, adjustment of the mA and/or kV according to patient  size and/or use of iterative reconstruction technique. COMPARISON:  11/05/2022 CT head, 06/23/2009 CT cervical spine FINDINGS: CT HEAD FINDINGS Brain: No evidence of acute infarct, hemorrhage, mass, mass effect, or midline shift. No hydrocephalus or extra-axial fluid collection. Periventricular white matter changes, likely the sequela of chronic small vessel ischemic disease. Age related cerebral atrophy. Remote lacunar infarct in the left basal ganglia. Vascular: No hyperdense vessel. Atherosclerotic calcifications in the intracranial carotid and vertebral arteries. Skull: Negative for fracture or focal lesion. Sinuses/Orbits: Persistent opacification of the right sphenoid sinus with osseous thickening, consistent with chronic sinusitis. Status post bilateral lens replacements. Other: The mastoid air cells are well aerated. CT CERVICAL SPINE FINDINGS Alignment: No traumatic listhesis. Trace anterolisthesis of C7 on T1, which appears degenerative. Skull base and vertebrae: No acute  fracture or suspicious osseous lesion. Posterior fusion C3-C4. Soft tissues and spinal canal: No prevertebral fluid or swelling. No visible canal hematoma. Disc levels: Degenerative changes in the cervical spine.No high-grade spinal canal stenosis. Upper chest: No focal pulmonary opacity or pleural effusion. Emphysema. IMPRESSION: 1. No acute intracranial process. 2. No acute fracture or traumatic listhesis in the cervical spine. Electronically Signed   By: Wiliam Ke M.D.   On: 02/17/2023 17:16   DG Elbow 2 Views Right Result Date: 02/17/2023 CLINICAL DATA:  Fall.  Elbow pain. EXAM: RIGHT ELBOW - 2 VIEW COMPARISON:  None Available. FINDINGS: No acute fracture or dislocation. No aggressive osseous lesion. Mild degenerative changes of elbow joint. Olecranon enthesophyte noted. No radiopaque foreign bodies. Soft tissues are within normal limits. IMPRESSION: *No acute osseous abnormality of the right elbow. Electronically Signed   By: Jules Schick M.D.   On: 02/17/2023 16:35       Acute Onset Exertional Dyspnea (R06.00) Assessment: Patient presents with rapidly progressive shortness of breath over 3 days Significant functional limitation (unable to walk >10-15 feet without dyspnea) Dyspnea at rest is concerning for potentially serious underlying condition Differential includes: pulmonary embolism, heart valve dysfunction, COPD exacerbation, pneumonia, acute heart failure Risk factors include: history of bacteremia with possible valve involvement, age, limited mobility, history of small vessel disease Chronic kidney disease and anemia may contribute to symptom burden Plan: Urgent in-office visit scheduled for tomorrow for thorough evaluation Diagnostic workup to include: chest x-ray, EKG, D-dimer, troponin, BNP, CBC, CMP Urgent cardiology referral placed for evaluation of potential valve dysfunction Begin daily aspirin 81mg  to mitigate potential cardiac/thromboembolic risk Prescription for home  nebulizer with ipratropium-albuterol solution for symptom relief Patient counseled extensively on warning signs requiring immediate emergency evaluation Patient understands risks of declining emergency department evaluation but prefers stepwise approach 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) Assessment: Prior bloodstream infection with Staphylococcus aureus (methicillin sensitive) Concern for possible endocarditis sequelae given current presentation Previous antibiotic therapy completed, but full evaluation for endocarditis was limited Potential valve damage could contribute significantly to current symptoms Plan: Include concern for valve dysfunction in cardiology referral Consider echocardiogram to evaluate valve structure and function Review prior infectious disease recommendations and blood culture results Medication Management (C62.376) Assessment: Non-adherence to metoprolol and aspirin Continued use of rosuvastatin Complex medication regimen requires reconciliation Plan: Initiate daily aspirin 81mg  Continue rosuvastatin Assess barriers to metoprolol adherence at in-person follow-up Complete full medication reconciliation at next visit Consider medication simplification strategies to improve adherence Chronic Kidney Disease (N18.3) Assessment: Moderate CKD with eGFR 39, creatinine 1.77 Stable compared to recent values May impact medication choices and dosing Plan: Monitor renal function at follow-up visit Adjust medication dosing  as appropriate for renal function Evaluate for potentially nephrotoxic medications Chronic Anemia (D64.9) Assessment: Persistent anemia with hemoglobin 9.4 May contribute to exertional symptoms Multifactorial etiology likely including CKD Plan: Reassess CBC at follow-up Consider iron studies if not recently evaluated May require hematology referral if anemia worsens Follow-up: Urgent in-office appointment  scheduled for tomorrow. Patient instructed to go to emergency department if experiencing increased shortness of breath, chest pain, syncope, hemoptysis, or other concerning symptoms before scheduled appointment.       Orders Placed During this Encounter:   Orders Placed This Encounter  Procedures   For home use only DME Nebulizer machine    Patient needs a nebulizer to treat with the following condition:   COPD (chronic obstructive pulmonary disease) (HCC) [536644]    Length of Need:   Lifetime    Additional equipment included:   Filter    Additional equipment included:   Administration kit   For home use only DME Nebulizer machine    Patient needs a nebulizer to treat with the following condition:   COPD (chronic obstructive pulmonary disease) (HCC) [034742]    Length of Need:   Lifetime   For home use only DME Nebulizer machine    Patient needs a nebulizer to treat with the following condition:   COPD (chronic obstructive pulmonary disease) (HCC) [595638]    Length of Need:   Lifetime   Ambulatory referral to Cardiology    Referral Priority:   Urgent    Referral Type:   Consultation    Referral Reason:   Specialty Services Required    Number of Visits Requested:   1   Meds ordered this encounter  Medications   Respiratory Therapy Supplies (NEBULIZER/TUBING/MOUTHPIECE) KIT    Sig: 1 each by Does not apply route every 4 (four) hours as needed.    Dispense:  1 kit    Refill:  1   DISCONTD: ipratropium-albuterol (DUONEB) 0.5-2.5 (3) MG/3ML SOLN    Sig: Take 3 mLs by nebulization every 4 (four) hours as needed.    Dispense:  120 mL    Refill:  1   Respiratory Therapy Supplies (NEBULIZER/TUBING/MOUTHPIECE) KIT    Sig: 1 each by Does not apply route every 4 (four) hours as needed.    Dispense:  1 kit    Refill:  1   DISCONTD: ipratropium-albuterol (DUONEB) 0.5-2.5 (3) MG/3ML SOLN    Sig: Take 3 mLs by nebulization every 4 (four) hours as needed.    Dispense:  120 mL    Refill:  1     Treatment plan discussed and reviewed in detail. Explained medication safety and potential side effects.  Answered all patient questions and confirmed understanding and comfort with the plan. Encouraged patient to contact our office if they have any questions or concerns.  Agreed on patient coming for a sooner office visit if symptoms worsen, persist, or new symptoms develop. Discussed precautions in case of needing to visit the Emergency Department.    ----------------------------------------------------- Attestation:  Today's Healthcare Provider Lula Olszewski, MD was located at office at [facility] at [facility] [address], [place] [address].  The patient was located at home. All video encounter participant identities and locations confirmed visually and verbally.Today's Telemedicine visit was conducted via synchronous Video after consent for telemedicine was obtained:  Video connection was never lost    This document was transcribed and resynthesized, in part, by artificial intelligence (Abridge) using HIPAA-compliant recording of the clinical interaction;  We have discussed the our use of AI scribe software for clinical note transcription with the patient, who has given verbal consent to proceed.    # Medical Decision Making Attestation  ## Level of MDM: High Complexity  ### 1. Number and Complexity of Problems Addressed Medical decision making today involved evaluation of a **potentially life-threatening acute condition** (acute onset dyspnea with dyspnea at rest) in a patient with multiple complicating comorbidities:  - **Acute dyspnea** of unclear etiology, rapidly progressive over 3 days, now with dyspnea at rest, representing an acute illness that poses a threat to bodily function - **History of MSSA bacteremia** with potential endocarditis/valvular complications that could be contributing to current presentation - **Chronic kidney disease** (Stage 3)  affecting treatment options and medication dosing - **Chronic anemia** potentially contributing to symptom burden - **Medication non-adherence** complicating clinical management  The diagnostic complexity is high given the multiple potential etiologies for the patient's acute symptoms, including pulmonary embolism, heart valve dysfunction, COPD exacerbation, and acute heart failure, each requiring different management approaches.  ### 2. Amount and Complexity of Data Reviewed Today's evaluation required extensive data review and analysis:  - **Independent review and interpretation** of multiple previous imaging studies, including:   - Brain MRI from 02/17/2023 showing chronic microhemorrhages and small vessel ischemia   - CT head and cervical spine from 02/17/2023 showing emphysema   - Previous neuroimaging from 01/30/2023  - **Comprehensive analysis** of extensive laboratory data spanning multiple encounters (05/03/2022 through 02/22/2023), including:   - Trend analysis of renal function parameters   - Evaluation of hematologic values   - Assessment of inflammatory markers   - Review of previous metabolic profiles  - **Medication reconciliation** across comprehensive medication list with assessment of adherence patterns  ### 3. Risk of Complications and/or Morbidity/Mortality The risk associated with today's clinical decisions was high:  - **Decision regarding emergency evaluation vs. urgent outpatient management** for patient with concerning symptoms who declined ED evaluation - **Risk-benefit analysis** of medication recommendations in setting of renal dysfunction - **Management of potentially high-risk cardiac condition** (possible valvular dysfunction) in outpatient setting - **Coordination of urgent multispecialty care** (cardiology, primary care) - **Complex risk assessment** for patient with multiple comorbidities and limited recall of medical history  The management plan required  high-complexity decision making to balance: 1. Patient preferences (declining emergency evaluation) 2. Medical necessity of urgent evaluation 3. Interim risk mitigation strategies 4. Appropriate diagnostic pathway 5. Medication management decisions  This level of medical decision making supports a high-complexity evaluation as it involved multiple serious conditions requiring comprehensive assessment, extensive data review, and high-risk clinical decisions.

## 2023-04-19 NOTE — Patient Instructions (Signed)
 After Visit Summary IMPORTANT: Your breathing symptoms require prompt medical attention Your Diagnosis and Treatment Plan Condition Plan  Shortness of Breath - Urgent in-office appointment tomorrow - Tests: breathing tests, heart tracing, blood tests, chest x-ray - Urgent heart specialist referral - Home nebulizer treatment prescribed   Past Blood Infection - Checking if this affected your heart valves - May need heart ultrasound (echocardiogram) - Heart specialist will evaluate this concern   Medication Management - Start taking baby aspirin (81mg ) daily - Continue your cholesterol medication (rosuvastatin) - Use nebulizer treatment when short of breath - Bring all medication bottles to next appointment   Warning Signs - Go to the Emergency Room if: Your shortness of breath gets significantly worse You develop chest pain or pressure You cough up blood You feel lightheaded, dizzy, or faint You notice blue discoloration of your lips or fingers You have difficulty speaking in complete sentences    Medication Instructions Medication Instructions  Aspirin 81mg  (baby aspirin) Take 1 tablet by mouth once daily  Rosuvastatin (Crestor) 40mg  Continue taking 1 tablet by mouth daily  Ipratropium-Albuterol (DuoNeb) Use 1 treatment via nebulizer every 4 hours as needed for shortness of breath  Home Care Instructions    Rest frequently and avoid unnecessary physical exertion    Sleep with your head elevated on 2-3 pillows    Keep track of how far you can walk before becoming short of breath    Stay well hydrated unless otherwise instructed Follow-up Plan Urgent Office Visit: Tomorrow Cardiology Appointment: You will be contacted to schedule an urgent appointment What to Bring: All your medication bottles (even empty ones) Tests Planned: Breathing tests, heart tracing (EKG), blood tests, chest x-ray    While we've scheduled urgent follow-up care, the safest option would still be emergency  evaluation if your symptoms worsen before your appointment.

## 2023-04-19 NOTE — Telephone Encounter (Signed)
 Duplicate request. Spoke with pharmacy and updated the dispense amount to 90 ml per their request.

## 2023-04-20 ENCOUNTER — Encounter: Payer: Self-pay | Admitting: Internal Medicine

## 2023-04-20 ENCOUNTER — Inpatient Hospital Stay (HOSPITAL_BASED_OUTPATIENT_CLINIC_OR_DEPARTMENT_OTHER)
Admission: EM | Admit: 2023-04-20 | Discharge: 2023-04-24 | DRG: 291 | Disposition: A | Discharge status: Home Health | Attending: Internal Medicine | Admitting: Internal Medicine

## 2023-04-20 ENCOUNTER — Ambulatory Visit (INDEPENDENT_AMBULATORY_CARE_PROVIDER_SITE_OTHER): Admitting: Internal Medicine

## 2023-04-20 ENCOUNTER — Emergency Department (HOSPITAL_BASED_OUTPATIENT_CLINIC_OR_DEPARTMENT_OTHER)

## 2023-04-20 ENCOUNTER — Encounter (HOSPITAL_BASED_OUTPATIENT_CLINIC_OR_DEPARTMENT_OTHER): Payer: Self-pay

## 2023-04-20 ENCOUNTER — Other Ambulatory Visit: Payer: Self-pay

## 2023-04-20 VITALS — BP 180/73 | HR 85 | Temp 97.3°F | Ht 70.0 in | Wt 153.8 lb

## 2023-04-20 DIAGNOSIS — J9601 Acute respiratory failure with hypoxia: Secondary | ICD-10-CM | POA: Diagnosis not present

## 2023-04-20 DIAGNOSIS — Z800 Family history of malignant neoplasm of digestive organs: Secondary | ICD-10-CM | POA: Diagnosis not present

## 2023-04-20 DIAGNOSIS — R0602 Shortness of breath: Secondary | ICD-10-CM

## 2023-04-20 DIAGNOSIS — N179 Acute kidney failure, unspecified: Secondary | ICD-10-CM | POA: Diagnosis not present

## 2023-04-20 DIAGNOSIS — I951 Orthostatic hypotension: Secondary | ICD-10-CM | POA: Diagnosis not present

## 2023-04-20 DIAGNOSIS — F17210 Nicotine dependence, cigarettes, uncomplicated: Secondary | ICD-10-CM | POA: Diagnosis not present

## 2023-04-20 DIAGNOSIS — H9190 Unspecified hearing loss, unspecified ear: Secondary | ICD-10-CM | POA: Diagnosis present

## 2023-04-20 DIAGNOSIS — R0609 Other forms of dyspnea: Secondary | ICD-10-CM | POA: Diagnosis not present

## 2023-04-20 DIAGNOSIS — M549 Dorsalgia, unspecified: Secondary | ICD-10-CM | POA: Diagnosis present

## 2023-04-20 DIAGNOSIS — I10 Essential (primary) hypertension: Secondary | ICD-10-CM | POA: Diagnosis not present

## 2023-04-20 DIAGNOSIS — T502X5A Adverse effect of carbonic-anhydrase inhibitors, benzothiadiazides and other diuretics, initial encounter: Secondary | ICD-10-CM | POA: Diagnosis not present

## 2023-04-20 DIAGNOSIS — I5041 Acute combined systolic (congestive) and diastolic (congestive) heart failure: Secondary | ICD-10-CM | POA: Diagnosis not present

## 2023-04-20 DIAGNOSIS — I110 Hypertensive heart disease with heart failure: Principal | ICD-10-CM | POA: Diagnosis present

## 2023-04-20 DIAGNOSIS — Z8249 Family history of ischemic heart disease and other diseases of the circulatory system: Secondary | ICD-10-CM

## 2023-04-20 DIAGNOSIS — Z884 Allergy status to anesthetic agent status: Secondary | ICD-10-CM

## 2023-04-20 DIAGNOSIS — I509 Heart failure, unspecified: Principal | ICD-10-CM

## 2023-04-20 DIAGNOSIS — I5033 Acute on chronic diastolic (congestive) heart failure: Secondary | ICD-10-CM | POA: Diagnosis not present

## 2023-04-20 DIAGNOSIS — I2489 Other forms of acute ischemic heart disease: Secondary | ICD-10-CM | POA: Diagnosis not present

## 2023-04-20 DIAGNOSIS — M19032 Primary osteoarthritis, left wrist: Secondary | ICD-10-CM | POA: Diagnosis present

## 2023-04-20 DIAGNOSIS — E876 Hypokalemia: Secondary | ICD-10-CM

## 2023-04-20 DIAGNOSIS — J439 Emphysema, unspecified: Secondary | ICD-10-CM | POA: Diagnosis present

## 2023-04-20 DIAGNOSIS — J90 Pleural effusion, not elsewhere classified: Secondary | ICD-10-CM | POA: Diagnosis not present

## 2023-04-20 DIAGNOSIS — F1120 Opioid dependence, uncomplicated: Secondary | ICD-10-CM | POA: Diagnosis not present

## 2023-04-20 DIAGNOSIS — I739 Peripheral vascular disease, unspecified: Secondary | ICD-10-CM | POA: Diagnosis present

## 2023-04-20 DIAGNOSIS — Z23 Encounter for immunization: Secondary | ICD-10-CM

## 2023-04-20 DIAGNOSIS — Z79899 Other long term (current) drug therapy: Secondary | ICD-10-CM

## 2023-04-20 DIAGNOSIS — Z8673 Personal history of transient ischemic attack (TIA), and cerebral infarction without residual deficits: Secondary | ICD-10-CM

## 2023-04-20 DIAGNOSIS — Z1152 Encounter for screening for COVID-19: Secondary | ICD-10-CM

## 2023-04-20 DIAGNOSIS — I480 Paroxysmal atrial fibrillation: Secondary | ICD-10-CM | POA: Diagnosis not present

## 2023-04-20 DIAGNOSIS — I5032 Chronic diastolic (congestive) heart failure: Secondary | ICD-10-CM | POA: Diagnosis present

## 2023-04-20 DIAGNOSIS — J9811 Atelectasis: Secondary | ICD-10-CM | POA: Diagnosis not present

## 2023-04-20 DIAGNOSIS — Z981 Arthrodesis status: Secondary | ICD-10-CM

## 2023-04-20 DIAGNOSIS — E785 Hyperlipidemia, unspecified: Secondary | ICD-10-CM | POA: Diagnosis present

## 2023-04-20 DIAGNOSIS — R7989 Other specified abnormal findings of blood chemistry: Secondary | ICD-10-CM

## 2023-04-20 DIAGNOSIS — J449 Chronic obstructive pulmonary disease, unspecified: Secondary | ICD-10-CM | POA: Diagnosis not present

## 2023-04-20 DIAGNOSIS — N400 Enlarged prostate without lower urinary tract symptoms: Secondary | ICD-10-CM | POA: Diagnosis present

## 2023-04-20 DIAGNOSIS — D638 Anemia in other chronic diseases classified elsewhere: Secondary | ICD-10-CM | POA: Diagnosis present

## 2023-04-20 DIAGNOSIS — G8929 Other chronic pain: Secondary | ICD-10-CM | POA: Diagnosis not present

## 2023-04-20 DIAGNOSIS — J111 Influenza due to unidentified influenza virus with other respiratory manifestations: Secondary | ICD-10-CM | POA: Diagnosis not present

## 2023-04-20 HISTORY — DX: Acute on chronic diastolic (congestive) heart failure: I50.33

## 2023-04-20 LAB — CBC WITH DIFFERENTIAL/PLATELET
Abs Immature Granulocytes: 0.02 10*3/uL (ref 0.00–0.07)
Basophils Absolute: 0 10*3/uL (ref 0.0–0.1)
Basophils Relative: 1 %
Eosinophils Absolute: 0.2 10*3/uL (ref 0.0–0.5)
Eosinophils Relative: 2 %
HCT: 26.3 % — ABNORMAL LOW (ref 39.0–52.0)
Hemoglobin: 8.7 g/dL — ABNORMAL LOW (ref 13.0–17.0)
Immature Granulocytes: 0 %
Lymphocytes Relative: 12 %
Lymphs Abs: 0.8 10*3/uL (ref 0.7–4.0)
MCH: 30.1 pg (ref 26.0–34.0)
MCHC: 33.1 g/dL (ref 30.0–36.0)
MCV: 91 fL (ref 80.0–100.0)
Monocytes Absolute: 0.5 10*3/uL (ref 0.1–1.0)
Monocytes Relative: 8 %
Neutro Abs: 5.1 10*3/uL (ref 1.7–7.7)
Neutrophils Relative %: 77 %
Platelets: 224 10*3/uL (ref 150–400)
RBC: 2.89 MIL/uL — ABNORMAL LOW (ref 4.22–5.81)
RDW: 14.2 % (ref 11.5–15.5)
WBC: 6.6 10*3/uL (ref 4.0–10.5)
nRBC: 0 % (ref 0.0–0.2)

## 2023-04-20 LAB — BASIC METABOLIC PANEL
Anion gap: 8 (ref 5–15)
BUN: 17 mg/dL (ref 8–23)
CO2: 27 mmol/L (ref 22–32)
Calcium: 8.1 mg/dL — ABNORMAL LOW (ref 8.9–10.3)
Chloride: 102 mmol/L (ref 98–111)
Creatinine, Ser: 1.39 mg/dL — ABNORMAL HIGH (ref 0.61–1.24)
GFR, Estimated: 52 mL/min — ABNORMAL LOW (ref >60)
Glucose, Bld: 135 mg/dL — ABNORMAL HIGH (ref 70–99)
Potassium: 2.8 mmol/L — ABNORMAL LOW (ref 3.5–5.1)
Sodium: 137 mmol/L (ref 135–145)

## 2023-04-20 LAB — RESP PANEL BY RT-PCR (RSV, FLU A&B, COVID)  RVPGX2
Influenza A by PCR: NEGATIVE
Influenza B by PCR: NEGATIVE
Resp Syncytial Virus by PCR: NEGATIVE
SARS Coronavirus 2 by RT PCR: NEGATIVE

## 2023-04-20 LAB — MAGNESIUM: Magnesium: 1.9 mg/dL (ref 1.7–2.4)

## 2023-04-20 LAB — BRAIN NATRIURETIC PEPTIDE: B Natriuretic Peptide: 2352.1 pg/mL — ABNORMAL HIGH (ref 0.0–100.0)

## 2023-04-20 LAB — TROPONIN I (HIGH SENSITIVITY)
Troponin I (High Sensitivity): 79 ng/L — ABNORMAL HIGH (ref <18)
Troponin I (High Sensitivity): 72 ng/L — ABNORMAL HIGH (ref <18)

## 2023-04-20 MED ORDER — NITROGLYCERIN 2 % TD OINT: 1.0000 [in_us] | TOPICAL_OINTMENT | Freq: Four times a day (QID) | TRANSDERMAL | Status: DC

## 2023-04-20 MED ORDER — IPRATROPIUM-ALBUTEROL 0.5-2.5 (3) MG/3ML IN SOLN
3.0000 mL | Freq: Once | RESPIRATORY_TRACT | Status: AC
  Administered 2023-04-20: 3 mL via RESPIRATORY_TRACT
  Filled 2023-04-20: qty 3

## 2023-04-20 MED ORDER — AMITRIPTYLINE HCL 25 MG PO TABS
50.0000 mg | ORAL_TABLET | Freq: Every day | ORAL | Status: DC
  Administered 2023-04-20 – 2023-04-23 (×4): 50 mg via ORAL
  Filled 2023-04-20 (×4): qty 2

## 2023-04-20 MED ORDER — OXYCODONE HCL 5 MG PO TABS
10.0000 mg | ORAL_TABLET | Freq: Three times a day (TID) | ORAL | Status: DC | PRN
  Administered 2023-04-21 – 2023-04-24 (×7): 10 mg via ORAL
  Filled 2023-04-20 (×7): qty 2

## 2023-04-20 MED ORDER — FUROSEMIDE 10 MG/ML IJ SOLN
40.0000 mg | Freq: Once | INTRAMUSCULAR | Status: AC
  Administered 2023-04-20: 40 mg via INTRAVENOUS
  Filled 2023-04-20: qty 4

## 2023-04-20 MED ORDER — FUROSEMIDE 10 MG/ML IJ SOLN: 40.0000 mg | Freq: Once | INTRAMUSCULAR | Status: DC

## 2023-04-20 MED ORDER — ENOXAPARIN SODIUM 40 MG/0.4ML IJ SOSY
40.0000 mg | PREFILLED_SYRINGE | INTRAMUSCULAR | Status: DC
  Administered 2023-04-20 – 2023-04-23 (×4): 40 mg via SUBCUTANEOUS
  Filled 2023-04-20 (×4): qty 0.4

## 2023-04-20 MED ORDER — ACETAMINOPHEN 325 MG PO TABS
650.0000 mg | ORAL_TABLET | Freq: Four times a day (QID) | ORAL | Status: DC | PRN
  Administered 2023-04-21 – 2023-04-23 (×6): 650 mg via ORAL
  Filled 2023-04-20 (×6): qty 2

## 2023-04-20 MED ORDER — ONDANSETRON HCL 4 MG/2ML IJ SOLN: 4.0000 mg | Freq: Four times a day (QID) | INTRAMUSCULAR | Status: DC | PRN

## 2023-04-20 MED ORDER — NITROGLYCERIN 2 % TD OINT
1.0000 [in_us] | TOPICAL_OINTMENT | Freq: Four times a day (QID) | TRANSDERMAL | Status: DC
  Administered 2023-04-20 – 2023-04-22 (×5): 1 [in_us] via TOPICAL
  Filled 2023-04-20 (×5): qty 1

## 2023-04-20 MED ORDER — POTASSIUM CHLORIDE CRYS ER 20 MEQ PO TBCR
40.0000 meq | EXTENDED_RELEASE_TABLET | Freq: Once | ORAL | Status: AC
  Administered 2023-04-20: 40 meq via ORAL
  Filled 2023-04-20: qty 2

## 2023-04-20 MED ORDER — ONDANSETRON HCL 4 MG PO TABS: 4.0000 mg | ORAL_TABLET | Freq: Four times a day (QID) | ORAL | Status: DC | PRN

## 2023-04-20 MED ORDER — ALBUTEROL SULFATE (2.5 MG/3ML) 0.083% IN NEBU
2.5000 mg | INHALATION_SOLUTION | RESPIRATORY_TRACT | Status: DC | PRN
  Administered 2023-04-20 – 2023-04-21 (×2): 2.5 mg via RESPIRATORY_TRACT
  Filled 2023-04-20 (×3): qty 3

## 2023-04-20 MED ORDER — POLYETHYLENE GLYCOL 3350 17 G PO PACK: 17.0000 g | PACK | Freq: Every day | ORAL | Status: DC | PRN

## 2023-04-20 MED ORDER — ROSUVASTATIN CALCIUM 20 MG PO TABS
40.0000 mg | ORAL_TABLET | Freq: Every day | ORAL | Status: DC
  Administered 2023-04-20 – 2023-04-23 (×4): 40 mg via ORAL
  Filled 2023-04-20 (×4): qty 2

## 2023-04-20 MED ORDER — PANTOPRAZOLE SODIUM 40 MG PO TBEC
40.0000 mg | DELAYED_RELEASE_TABLET | Freq: Every day | ORAL | Status: DC
  Administered 2023-04-20 – 2023-04-24 (×5): 40 mg via ORAL
  Filled 2023-04-20 (×5): qty 1

## 2023-04-20 MED ORDER — ACETAMINOPHEN 650 MG RE SUPP: 650.0000 mg | Freq: Four times a day (QID) | RECTAL | Status: DC | PRN

## 2023-04-20 MED ORDER — FUROSEMIDE 10 MG/ML IJ SOLN
40.0000 mg | Freq: Two times a day (BID) | INTRAMUSCULAR | Status: DC
  Administered 2023-04-20: 40 mg via INTRAVENOUS
  Filled 2023-04-20: qty 4

## 2023-04-20 MED ORDER — PNEUMOCOCCAL 20-VAL CONJ VACC 0.5 ML IM SUSY
0.5000 mL | PREFILLED_SYRINGE | INTRAMUSCULAR | Status: AC
  Administered 2023-04-22: 0.5 mL via INTRAMUSCULAR
  Filled 2023-04-20: qty 0.5

## 2023-04-20 MED ORDER — POTASSIUM CHLORIDE 10 MEQ/100ML IV SOLN
10.0000 meq | INTRAVENOUS | Status: AC
  Administered 2023-04-20 (×2): 10 meq via INTRAVENOUS
  Filled 2023-04-20 (×2): qty 100

## 2023-04-20 MED ORDER — SENNOSIDES-DOCUSATE SODIUM 8.6-50 MG PO TABS
1.0000 | ORAL_TABLET | Freq: Two times a day (BID) | ORAL | Status: DC
  Administered 2023-04-20 – 2023-04-23 (×6): 1 via ORAL
  Filled 2023-04-20 (×8): qty 1

## 2023-04-20 NOTE — Plan of Care (Signed)
  Problem: Activity: Goal: Risk for activity intolerance will decrease 04/20/2023 1751 by Elnita Maxwell, RN Outcome: Progressing 04/20/2023 1710 by Elnita Maxwell, RN Outcome: Progressing   Problem: Clinical Measurements: Goal: Cardiovascular complication will be avoided 04/20/2023 1751 by Elnita Maxwell, RN Outcome: Progressing 04/20/2023 1710 by Elnita Maxwell, RN Outcome: Progressing   Problem: Cardiac: Goal: Ability to achieve and maintain adequate cardiopulmonary perfusion will improve Outcome: Progressing   Problem: Activity: Goal: Capacity to carry out activities will improve Outcome: Progressing

## 2023-04-20 NOTE — ED Notes (Addendum)
 RT Note: Patient oxygen saturation on room air while at rest= 89%, HR 85, RR 14 Patient oxygen saturation on room while ambulating only 1 minute = 86%, HR 119, RR 24 with an increased WOB, SOB and wheezing. Patient was placed back on 2lpm [place] and oxygen saturation increased to 95% after 2 minutes

## 2023-04-20 NOTE — ED Notes (Signed)
Michael Bender with cl called for transport

## 2023-04-20 NOTE — Patient Instructions (Signed)
 Head directly to emergency room for further evaluation.

## 2023-04-20 NOTE — Progress Notes (Addendum)
 Patient presents with shortness of breath, hypoxic 89% on room air at rest, 86% on room air with activity, workup significant for volume overload on physical exam, elevated BNP and imaging, most recent 2D echo October 2024 with evidence of grade 2 diastolic dysfunction, so he will be admitted to the telemetry unit, IV diuresis, troponins borderline elevated, at baseline, he denies any chest pain.  As well prolonged QTc, but potassium has been repleted. Huey Bienenstock MD

## 2023-04-20 NOTE — Progress Notes (Signed)
 ==============================  [facility]: [id number]   -- Medical Office Visit --  Patient: Michael Bender      Age: 78 y.o.       Sex:  male  Date:   04/20/2023 Today's Healthcare Provider: Lula Olszewski, MD  ==============================    CHIEF COMPLAINT: Shortness of Breath (Pt c.o of SOB for 4 days. )  Background This is a 78 y.o. male who has Pulmonary emphysema (HCC); GERD; CONSTIPATION, CHRONIC; DISC DISEASE, LUMBAR; Bilateral leg pain; Esophageal dysphagia; Right sided temporal headache; Insomnia; PAD (peripheral artery disease) (HCC); Senile ecchymosis; History of CVA (cerebrovascular accident); HLD (hyperlipidemia); COPD (chronic obstructive pulmonary disease) (HCC); Iron deficiency anemia; Duodenal stenosis; Chronic narcotic dependence (HCC); Atherosclerosis of aorta (HCC); History of fusion of cervical spine; History of lumbar fusion; High risk medication use; Intractable pain; Gynecomastia, male; Cachexia (HCC); Underweight on examination; Chronic low back pain with bilateral sciatica; AKI (acute kidney injury) (HCC); Syncope; MSSA bacteremia; Septic infrapatellar bursitis of right knee; Staphylococcal arthritis of left wrist (HCC); Infected blister of left index finger; Staphylococcal arthritis of right knee (HCC); Neck pain; Finger pain, left; Septic arthritis of wrist, left (HCC); Cervical discitis; Hardware complicating wound infection (HCC); Effusion of right knee joint; Memory loss; Frequent falls; Nonintractable headache; Effusion of right knee; and Balance problems on their problem list.  History of Present Illness Michael Bender is a 78 year old male who presents with worsening shortness of breath.  He has been experiencing worsening shortness of breath since yesterday, which has not improved. He did not obtain a nebulizer machine last night due to work commitments. He experiences significant wheezing, particularly at the  end of exhalation, which was not present the previous day. No lightheadedness when walking around and no recent episodes of lightheadedness. He took an aspirin yesterday.  He mentions a sore throat and congestion that is 'getting sore now'. No fever is present.  He experiences difficulty when bending over, such as when putting on shoes, ?due to fluid pushing up, which he associates with his breathing difficulties.  He notes this after I point out I hear fluid in lungs.  However he denies orthopnea or leg swelling.  He has not smoked cigarettes for many years.  Visually reviewed that patient  has a past medical history of Arthritis, Balance problems (02/21/2023), Blood transfusion, Chronic back pain, COPD (chronic obstructive pulmonary disease) (HCC), Disturbance of skin sensation (05/22/2018), Effusion of right knee (02/21/2023), Effusion of right knee joint (01/16/2023), Essential hypertension (03/05/2007), Gastric erosion, Gastritis and gastroduodenitis, Gastrointestinal hemorrhage with melena (11/20/2018), GIB (gastrointestinal bleeding) (07/18/2019), Headache(784.0), Hemorrhoids, internal, History of duodenal ulcer, History of lumbar fusion (03/03/2022), History of upper gastrointestinal bleeding (03/03/2022), HLD (hyperlipidemia), Hypertension, Loss of weight, Neck rigidity, Nocturia, PAIN, CHRONIC NEC (10/06/2006), Pneumonia, Prostate disease, and S/P cervical spinal fusion (03/03/2022). Manually updated: No problems updated. Verbally reviewed (per [employer]-generated extraction):  Visually reviewed/updated: Current Outpatient Medications on File Prior to Visit  Medication Sig   amitriptyline (ELAVIL) 50 MG tablet Take 1 tablet (50 mg total) by mouth at bedtime.   amLODipine (NORVASC) 10 MG tablet Take 1 tablet (10 mg total) by mouth daily.   cefadroxil (DURICEF) 500 MG capsule Take 2 capsules (1,000 mg total) by mouth 2 (two) times daily.   cyclobenzaprine (FLEXERIL) 10 MG tablet Take 1 tablet  (10 mg total) by mouth 3 (three) times daily as needed.   ipratropium-albuterol (DUONEB) 0.5-2.5 (3) MG/3ML SOLN Take 3 mLs by nebulization  every 4 (four) hours as needed.   metoprolol tartrate (LOPRESSOR) 25 MG tablet Take 1 tablet (25 mg total) by mouth 2 (two) times daily.   Oxycodone HCl 10 MG TABS Take 1 tablet (10 mg total) by mouth every 8 (eight) hours as needed.   pantoprazole (PROTONIX) 40 MG tablet TAKE 1 TABLET BY MOUTH EVERY DAY BEFORE BREAKFAST   polyethylene glycol (MIRALAX / GLYCOLAX) 17 g packet Take 17 g by mouth daily as needed for moderate constipation.   potassium chloride SA (KLOR-CON M) 20 MEQ tablet Take 1 tablet (20 mEq total) by mouth 2 (two) times daily.   Respiratory Therapy Supplies (NEBULIZER/TUBING/MOUTHPIECE) KIT 1 each by Does not apply route every 4 (four) hours as needed.   Respiratory Therapy Supplies (NEBULIZER/TUBING/MOUTHPIECE) KIT 1 each by Does not apply route every 4 (four) hours as needed.   rosuvastatin (CRESTOR) 40 MG tablet TAKE 1 TABLET BY MOUTH DAILY. REPLACES ATORVASTATIN (STOP ATORVASTATIN IF STILL TAKING)   SUMAtriptan (IMITREX) 50 MG tablet TAKE 1 TABLET (50 MG TOTAL) BY MOUTH DAILY. MAY REPEAT IN 2 HOURS IF HEADACHE PERSISTS OR RECURS.   No current facility-administered medications on file prior to visit.  There are no discontinued medications.         04/20/2023    8:05 AM 04/20/2023    8:01 AM 03/22/2023    9:41 AM  Vitals with BMI  Height  5\' 10"  5\' 10"   Weight  153 lbs 13 oz 143 lbs  BMI  22.07 20.52  Systolic 180 160   Diastolic 73 82   Pulse  85    Wt Readings from Last 10 Encounters:  04/20/23 153 lb 12.8 oz (69.8 kg)  03/22/23 143 lb (64.9 kg)  02/22/23 146 lb (66.2 kg)  02/17/23 146 lb (66.2 kg)  02/17/23 146 lb 3.2 oz (66.3 kg)  02/08/23 147 lb (66.7 kg)  01/19/23 147 lb (66.7 kg)  01/17/23 147 lb (66.7 kg)  12/28/22 120 lb (54.4 kg)  11/05/22 134 lb 14.7 oz (61.2 kg)  Vital signs reviewed.  Nursing notes reviewed.  Weight trend reviewed. Physical Exam  Physical Exam CHEST: Crackles in lower lungs bilaterally. No wheezing. Except after minimal exertion.  Sounds like more fluid on right Abnormalities and Problem-Specific physical exam findings:  he is not able to catch his breath even at rest but denies dizziness,  General Appearance:  No acute distress appreciable.   Well-groomed, healthy-appearing male.  Well proportioned with no abnormal fat distribution.  Good muscle tone. Pulmonary:  Normal work of breathing at rest, no respiratory distress apparent.    Musculoskeletal: All extremities are intact.  Neurological:  Awake, alert, oriented, and engaged.  No obvious focal neurological deficits or cognitive impairments.  Sensorium seems unclouded.   Speech is clear and coherent with logical content. Psychiatric:  Appropriate mood, pleasant and cooperative demeanor, thoughtful and engaged during the exam    No results found for any visits on 04/20/23. Office Visit on 02/22/2023  Component Date Value   Sed Rate 02/22/2023 19    CRP 02/22/2023 11.6 (H)    WBC 02/22/2023 6.1    RBC 02/22/2023 3.05 (L)    Hemoglobin 02/22/2023 9.4 (L)    HCT 02/22/2023 29.0 (L)    MCV 02/22/2023 95.1    MCH 02/22/2023 30.8    MCHC 02/22/2023 32.4    RDW 02/22/2023 12.4    Platelets 02/22/2023 166    MPV 02/22/2023 9.8    Neutro Abs 02/22/2023 3,239  Absolute Lymphocytes 02/22/2023 2,129    Absolute Monocytes 02/22/2023 561    Eosinophils Absolute 02/22/2023 153    Basophils Absolute 02/22/2023 18    Neutrophils Relative % 02/22/2023 53.1    Total Lymphocyte 02/22/2023 34.9    Monocytes Relative 02/22/2023 9.2    Eosinophils Relative 02/22/2023 2.5    Basophils Relative 02/22/2023 0.3    Glucose, Bld 02/22/2023 94    BUN 02/22/2023 20    Creat 02/22/2023 1.77 (H)    eGFR 02/22/2023 39 (L)    BUN/Creatinine Ratio 02/22/2023 11    Sodium 02/22/2023 140    Potassium 02/22/2023 3.1 (L)    Chloride 02/22/2023  104    CO2 02/22/2023 26    Calcium 02/22/2023 8.4 (L)    Total Protein 02/22/2023 6.3    Albumin 02/22/2023 3.4 (L)    Globulin 02/22/2023 2.9    AG Ratio 02/22/2023 1.2    Total Bilirubin 02/22/2023 0.3    Alkaline phosphatase (AP* 02/22/2023 92    AST 02/22/2023 21    ALT 02/22/2023 6 (L)   Admission on 02/17/2023, Discharged on 02/17/2023  Component Date Value   Color, Urine 02/17/2023 YELLOW    APPearance 02/17/2023 CLEAR    Specific Gravity, Urine 02/17/2023 1.011    pH 02/17/2023 5.0    Glucose, UA 02/17/2023 NEGATIVE    Hgb urine dipstick 02/17/2023 MODERATE (A)    Bilirubin Urine 02/17/2023 NEGATIVE    Ketones, ur 02/17/2023 NEGATIVE    Protein, ur 02/17/2023 30 (A)    Nitrite 02/17/2023 NEGATIVE    Leukocytes,Ua 02/17/2023 NEGATIVE    RBC / HPF 02/17/2023 0-5    WBC, UA 02/17/2023 0-5    Bacteria, UA 02/17/2023 NONE SEEN    Squamous Epithelial / HPF 02/17/2023 0-5    Mucus 02/17/2023 PRESENT    Glucose-Capillary 02/17/2023 164 (H)    WBC 02/17/2023 5.0    RBC 02/17/2023 2.85 (L)    Hemoglobin 02/17/2023 8.9 (L)    HCT 02/17/2023 27.3 (L)    MCV 02/17/2023 95.8    MCH 02/17/2023 31.2    MCHC 02/17/2023 32.6    RDW 02/17/2023 13.2    Platelets 02/17/2023 141 (L)    nRBC 02/17/2023 0.0    Neutrophils Relative % 02/17/2023 65    Neutro Abs 02/17/2023 3.2    Lymphocytes Relative 02/17/2023 24    Lymphs Abs 02/17/2023 1.2    Monocytes Relative 02/17/2023 10    Monocytes Absolute 02/17/2023 0.5    Eosinophils Relative 02/17/2023 1    Eosinophils Absolute 02/17/2023 0.1    Basophils Relative 02/17/2023 0    Basophils Absolute 02/17/2023 0.0    Immature Granulocytes 02/17/2023 0    Abs Immature Granulocytes 02/17/2023 0.01    Sodium 02/17/2023 139    Potassium 02/17/2023 3.6    Chloride 02/17/2023 105    CO2 02/17/2023 23    Glucose, Bld 02/17/2023 188 (H)    BUN 02/17/2023 26 (H)    Creatinine, Ser 02/17/2023 2.10 (H)    Calcium 02/17/2023 8.5 (L)     Total Protein 02/17/2023 6.3 (L)    Albumin 02/17/2023 3.1 (L)    AST 02/17/2023 32    ALT 02/17/2023 11    Alkaline Phosphatase 02/17/2023 79    Total Bilirubin 02/17/2023 0.5    GFR, Estimated 02/17/2023 32 (L)    Anion gap 02/17/2023 11    Sed Rate 02/17/2023 18 (H)    CRP 02/17/2023 0.8   Office Visit on 01/17/2023  Component Date  Value   CRP 01/17/2023 5.6    Sed Rate 01/17/2023 36 (H)    Glucose, Bld 01/17/2023 167 (H)    BUN 01/17/2023 21    Creat 01/17/2023 1.71 (H)    eGFR 01/17/2023 40 (L)    BUN/Creatinine Ratio 01/17/2023 12    Sodium 01/17/2023 141    Potassium 01/17/2023 4.7    Chloride 01/17/2023 107    CO2 01/17/2023 26    Calcium 01/17/2023 8.7    Total Protein 01/17/2023 6.6    Albumin 01/17/2023 3.7    Globulin 01/17/2023 2.9    AG Ratio 01/17/2023 1.3    Total Bilirubin 01/17/2023 0.4    Alkaline phosphatase (AP* 01/17/2023 106    AST 01/17/2023 19    ALT 01/17/2023 6 (L)    WBC 01/17/2023 5.6    RBC 01/17/2023 2.69 (L)    Hemoglobin 01/17/2023 8.3 (L)    HCT 01/17/2023 26.1 (L)    MCV 01/17/2023 97.0    MCH 01/17/2023 30.9    MCHC 01/17/2023 31.8 (L)    RDW 01/17/2023 13.1    Platelets 01/17/2023 181    MPV 01/17/2023 10.4    Neutro Abs 01/17/2023 2,638    Absolute Lymphocytes 01/17/2023 2,050    Absolute Monocytes 01/17/2023 521    Eosinophils Absolute 01/17/2023 353    Basophils Absolute 01/17/2023 39    Neutrophils Relative % 01/17/2023 47.1    Total Lymphocyte 01/17/2023 36.6    Monocytes Relative 01/17/2023 9.3    Eosinophils Relative 01/17/2023 6.3    Basophils Relative 01/17/2023 0.7   Lab on 01/02/2023  Component Date Value   WBC 01/02/2023 6.1    RBC 01/02/2023 2.56 (L)    Hemoglobin 01/02/2023 7.9 (L)    HCT 01/02/2023 25.2 (L)    MCV 01/02/2023 98.4    MCH 01/02/2023 30.9    MCHC 01/02/2023 31.3    RDW 01/02/2023 14.1    Platelets 01/02/2023 214    nRBC 01/02/2023 0.0    Neutrophils Relative % 01/02/2023 58    Neutro Abs  01/02/2023 3.5    Lymphocytes Relative 01/02/2023 29    Lymphs Abs 01/02/2023 1.7    Monocytes Relative 01/02/2023 10    Monocytes Absolute 01/02/2023 0.6    Eosinophils Relative 01/02/2023 3    Eosinophils Absolute 01/02/2023 0.2    Basophils Relative 01/02/2023 0    Basophils Absolute 01/02/2023 0.0    Immature Granulocytes 01/02/2023 0    Abs Immature Granulocytes 01/02/2023 0.02   Office Visit on 12/28/2022  Component Date Value   CRP 12/28/2022 32.2 (H)    Sed Rate 12/28/2022 67 (H)    Glucose, Bld 12/28/2022 135 (H)    BUN 12/28/2022 27 (H)    Creat 12/28/2022 1.79 (H)    eGFR 12/28/2022 38 (L)    BUN/Creatinine Ratio 12/28/2022 15    Sodium 12/28/2022 139    Potassium 12/28/2022 5.1    Chloride 12/28/2022 113 (H)    CO2 12/28/2022 18 (L)    Calcium 12/28/2022 8.5 (L)    Total Protein 12/28/2022 6.7    Albumin 12/28/2022 3.5 (L)    Globulin 12/28/2022 3.2    AG Ratio 12/28/2022 1.1    Total Bilirubin 12/28/2022 0.2    Alkaline phosphatase (AP* 12/28/2022 118    AST 12/28/2022 12    ALT 12/28/2022 <3 (L)    WBC 12/28/2022 5.6    RBC 12/28/2022 2.30 (L)    Hemoglobin 12/28/2022 7.1 (L)    HCT 12/28/2022 22.2 (  L)    MCV 12/28/2022 96.5    MCH 12/28/2022 30.9    MCHC 12/28/2022 32.0    RDW 12/28/2022 13.1    Platelets 12/28/2022 205    MPV 12/28/2022 9.4    Neutro Abs 12/28/2022 3,875    Absolute Lymphocytes 12/28/2022 1,042    Absolute Monocytes 12/28/2022 465    Eosinophils Absolute 12/28/2022 196    Basophils Absolute 12/28/2022 22    Neutrophils Relative % 12/28/2022 69.2    Total Lymphocyte 12/28/2022 18.6    Monocytes Relative 12/28/2022 8.3    Eosinophils Relative 12/28/2022 3.5    Basophils Relative 12/28/2022 0.4   No results displayed because visit has over 200 results.    Admission on 10/27/2022, Discharged on 11/02/2022  Component Date Value   WBC 10/27/2022 5.4    RBC 10/27/2022 3.37 (L)    Hemoglobin 10/27/2022 10.5 (L)    HCT 10/27/2022  30.7 (L)    MCV 10/27/2022 91.1    MCH 10/27/2022 31.2    MCHC 10/27/2022 34.2    RDW 10/27/2022 13.2    Platelets 10/27/2022 133 (L)    nRBC 10/27/2022 0.0    Color, Urine 10/27/2022 YELLOW    APPearance 10/27/2022 CLEAR    Specific Gravity, Urine 10/27/2022 1.010    pH 10/27/2022 6.0    Glucose, UA 10/27/2022 NEGATIVE    Hgb urine dipstick 10/27/2022 LARGE (A)    Bilirubin Urine 10/27/2022 NEGATIVE    Ketones, ur 10/27/2022 NEGATIVE    Protein, ur 10/27/2022 100 (A)    Nitrite 10/27/2022 NEGATIVE    Leukocytes,Ua 10/27/2022 NEGATIVE    Sodium 10/27/2022 134 (L)    Potassium 10/27/2022 2.1 (LL)    Chloride 10/27/2022 99    CO2 10/27/2022 24    Glucose, Bld 10/27/2022 126 (H)    BUN 10/27/2022 26 (H)    Creatinine, Ser 10/27/2022 4.21 (H)    Calcium 10/27/2022 7.4 (L)    Total Protein 10/27/2022 6.5    Albumin 10/27/2022 2.9 (L)    AST 10/27/2022 126 (H)    ALT 10/27/2022 49 (H)    Alkaline Phosphatase 10/27/2022 153 (H)    Total Bilirubin 10/27/2022 0.2 (L)    GFR, Estimated 10/27/2022 14 (L)    Anion gap 10/27/2022 11    Magnesium 10/27/2022 1.9    RBC / HPF 10/27/2022 11-20    WBC, UA 10/27/2022 0-5    Bacteria, UA 10/27/2022 NONE SEEN    Squamous Epithelial / HPF 10/27/2022 0-5    Total CK 10/27/2022 2,828 (H)    Sodium 10/28/2022 138    Potassium 10/28/2022 2.6 (LL)    Chloride 10/28/2022 104    CO2 10/28/2022 20 (L)    Glucose, Bld 10/28/2022 82    BUN 10/28/2022 25 (H)    Creatinine, Ser 10/28/2022 3.62 (H)    Calcium 10/28/2022 7.6 (L)    GFR, Estimated 10/28/2022 17 (L)    Anion gap 10/28/2022 14    Magnesium 10/28/2022 2.5 (H)    Phosphorus 10/28/2022 3.7    Total CK 10/28/2022 2,043 (H)    Total Protein 10/28/2022 5.5 (L)    Albumin 10/28/2022 2.4 (L)    AST 10/28/2022 95 (H)    ALT 10/28/2022 39    Alkaline Phosphatase 10/28/2022 134 (H)    Total Bilirubin 10/28/2022 0.5    Bilirubin, Direct 10/28/2022 0.2    Indirect Bilirubin 10/28/2022 0.3     Vitamin B-12 10/28/2022 676    Sodium 10/29/2022 142    Potassium  10/29/2022 3.2 (L)    Chloride 10/29/2022 106    CO2 10/29/2022 24    Glucose, Bld 10/29/2022 147 (H)    BUN 10/29/2022 24 (H)    Creatinine, Ser 10/29/2022 3.19 (H)    Calcium 10/29/2022 7.8 (L)    GFR, Estimated 10/29/2022 19 (L)    Anion gap 10/29/2022 12    Total CK 10/29/2022 992 (H)    Folate 10/30/2022 13.1    Iron 10/30/2022 46    TIBC 10/30/2022 223 (L)    Saturation Ratios 10/30/2022 21    UIBC 10/30/2022 177    Ferritin 10/30/2022 224    Retic Ct Pct 10/30/2022 0.6    RBC. 10/30/2022 3.41 (L)    Retic Count, Absolute 10/30/2022 19.8    Immature Retic Fract 10/30/2022 1.8 (L)    Sodium 10/30/2022 143    Potassium 10/30/2022 3.6    Chloride 10/30/2022 101    CO2 10/30/2022 27    Glucose, Bld 10/30/2022 183 (H)    BUN 10/30/2022 17    Creatinine, Ser 10/30/2022 2.28 (H)    Calcium 10/30/2022 8.2 (L)    GFR, Estimated 10/30/2022 29 (L)    Anion gap 10/30/2022 15    Total CK 10/30/2022 802 (H)    WBC 10/31/2022 4.5    RBC 10/31/2022 3.53 (L)    Hemoglobin 10/31/2022 11.3 (L)    HCT 10/31/2022 32.5 (L)    MCV 10/31/2022 92.1    MCH 10/31/2022 32.0    MCHC 10/31/2022 34.8    RDW 10/31/2022 13.1    Platelets 10/31/2022 144 (L)    nRBC 10/31/2022 0.0    Sodium 10/31/2022 140    Potassium 10/31/2022 3.6    Chloride 10/31/2022 104    CO2 10/31/2022 28    Glucose, Bld 10/31/2022 124 (H)    BUN 10/31/2022 13    Creatinine, Ser 10/31/2022 2.13 (H)    Calcium 10/31/2022 8.1 (L)    Total Protein 10/31/2022 5.8 (L)    Albumin 10/31/2022 2.5 (L)    AST 10/31/2022 114 (H)    ALT 10/31/2022 37    Alkaline Phosphatase 10/31/2022 130 (H)    Total Bilirubin 10/31/2022 0.8    GFR, Estimated 10/31/2022 31 (L)    Anion gap 10/31/2022 8    Total CK 10/31/2022 818 (H)    Total CK 11/01/2022 482 (H)    Sodium 11/01/2022 139    Potassium 11/01/2022 2.6 (LL)    Chloride 11/01/2022 98    CO2 11/01/2022 28     Glucose, Bld 11/01/2022 128 (H)    BUN 11/01/2022 11    Creatinine, Ser 11/01/2022 1.65 (H)    Calcium 11/01/2022 8.0 (L)    Total Protein 11/01/2022 5.6 (L)    Albumin 11/01/2022 2.4 (L)    AST 11/01/2022 146 (H)    ALT 11/01/2022 43    Alkaline Phosphatase 11/01/2022 121    Total Bilirubin 11/01/2022 0.2 (L)    GFR, Estimated 11/01/2022 43 (L)    Anion gap 11/01/2022 13    Magnesium 11/01/2022 1.3 (L)    Potassium 11/01/2022 2.7 (LL)    Magnesium 11/01/2022 2.4    Potassium 11/01/2022 3.6    Sodium 11/02/2022 138    Potassium 11/02/2022 3.1 (L)    Chloride 11/02/2022 104    CO2 11/02/2022 25    Glucose, Bld 11/02/2022 195 (H)    BUN 11/02/2022 9    Creatinine, Ser 11/02/2022 1.61 (H)    Calcium 11/02/2022 7.9 (L)  Total Protein 11/02/2022 5.7 (L)    Albumin 11/02/2022 2.3 (L)    AST 11/02/2022 84 (H)    ALT 11/02/2022 37    Alkaline Phosphatase 11/02/2022 117    Total Bilirubin 11/02/2022 0.3    GFR, Estimated 11/02/2022 44 (L)    Anion gap 11/02/2022 9   Office Visit on 05/03/2022  Component Date Value   Amphetamines, Urine 05/03/2022 Negative    Cannabinoid Quant, Ur 05/03/2022 Negative    Cocaine (Metab.) 05/03/2022 Negative    OPIATE QUANTITATIVE URINE 05/03/2022 Negative    PCP Quant, Ur 05/03/2022 Negative    WBC 05/03/2022 7.9    RBC 05/03/2022 4.27    Hemoglobin 05/03/2022 13.5    HCT 05/03/2022 39.9    MCV 05/03/2022 93.6    MCHC 05/03/2022 33.8    RDW 05/03/2022 13.9    Platelets 05/03/2022 223.0    Neutrophils Relative % 05/03/2022 58.4    Lymphocytes Relative 05/03/2022 28.0    Monocytes Relative 05/03/2022 8.8    Eosinophils Relative 05/03/2022 4.3    Basophils Relative 05/03/2022 0.5    Neutro Abs 05/03/2022 4.6    Lymphs Abs 05/03/2022 2.2    Monocytes Absolute 05/03/2022 0.7    Eosinophils Absolute 05/03/2022 0.3    Basophils Absolute 05/03/2022 0.0    Sodium 05/03/2022 138    Potassium 05/03/2022 3.5    Chloride 05/03/2022 101    CO2  05/03/2022 32    Glucose, Bld 05/03/2022 110 (H)    BUN 05/03/2022 14    Creatinine, Ser 05/03/2022 1.06    Total Bilirubin 05/03/2022 0.4    Alkaline Phosphatase 05/03/2022 83    AST 05/03/2022 48 (H)    ALT 05/03/2022 20    Total Protein 05/03/2022 6.5    Albumin 05/03/2022 3.7    GFR 05/03/2022 67.73    Calcium 05/03/2022 8.6    Cholesterol 05/03/2022 93    Triglycerides 05/03/2022 119.0    HDL 05/03/2022 56.30    VLDL 05/03/2022 23.8    LDL Cholesterol 05/03/2022 13    Total CHOL/HDL Ratio 05/03/2022 2    NonHDL 05/03/2022 36.54    Hgb A1c MFr Bld 05/03/2022 6.4    Microalb, Ur 05/03/2022 5.4 (H)    Creatinine,U 05/03/2022 33.0    Microalb Creat Ratio 05/03/2022 16.2    TSH 05/03/2022 1.88    HIV 1&2 Ab, 4th Generati* 05/03/2022 NON-REACTIVE    Sed Rate 05/03/2022 16    CRP 05/03/2022 <1.0   No image results found. MR Brain W and Wo Contrast Result Date: 02/17/2023 CLINICAL DATA:  Altered mental status EXAM: MRI HEAD WITHOUT AND WITH CONTRAST TECHNIQUE: Multiplanar, multiecho pulse sequences of the brain and surrounding structures were obtained without and with intravenous contrast. CONTRAST:  7mL GADAVIST GADOBUTROL 1 MMOL/ML IV SOLN COMPARISON:  None Available. FINDINGS: Brain: No acute infarct, mass effect or extra-axial collection. Multiple chronic microhemorrhages of the cerebellum and occipital lobes. There is multifocal hyperintense T2-weighted signal within the white matter. Generalized volume loss. 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 The midline structures are normal. There is no abnormal contrast enhancement. Vascular: Normal flow voids. Skull and upper cervical spine: Normal calvarium and skull base. Visualized upper cervical spine and soft tissues are normal. Sinuses/Orbits:No paranasal sinus fluid levels or advanced mucosal thickening. No mastoid or middle ear effusion. Normal orbits. IMPRESSION: 1. No acute intracranial abnormality. 2. Multiple  chronic microhemorrhages of the cerebellum and occipital lobes. 3. Old left basal ganglia and thalamic small vessel  infarcts and findings of chronic small vessel ischemia. Electronically Signed   By: Deatra Robinson M.D.   On: 02/17/2023 22:58   CT Head Wo Contrast Result Date: 02/17/2023 CLINICAL DATA:  Multiple falls and weakness, head and neck trauma EXAM: CT HEAD WITHOUT CONTRAST CT CERVICAL SPINE WITHOUT CONTRAST TECHNIQUE: Multidetector CT imaging of the head and cervical spine was performed following the standard protocol without intravenous contrast. Multiplanar CT image reconstructions of the cervical spine were also generated. RADIATION DOSE REDUCTION: This exam was performed according to the departmental dose-optimization program which includes automated exposure control, adjustment of the mA and/or kV according to patient size and/or use of iterative reconstruction technique. COMPARISON:  11/05/2022 CT head, 06/23/2009 CT cervical spine FINDINGS: CT HEAD FINDINGS Brain: No evidence of acute infarct, hemorrhage, mass, mass effect, or midline shift. No hydrocephalus or extra-axial fluid collection. Periventricular white matter changes, likely the sequela of chronic small vessel ischemic disease. Age related cerebral atrophy. Remote lacunar infarct in the left basal ganglia. Vascular: No hyperdense vessel. Atherosclerotic calcifications in the intracranial carotid and vertebral arteries. Skull: Negative for fracture or focal lesion. Sinuses/Orbits: Persistent opacification of the right sphenoid sinus with osseous thickening, consistent with chronic sinusitis. Status post bilateral lens replacements. Other: The mastoid air cells are well aerated. CT CERVICAL SPINE FINDINGS Alignment: No traumatic listhesis. Trace anterolisthesis of C7 on T1, which appears degenerative. Skull base and vertebrae: No acute fracture or suspicious osseous lesion. Posterior fusion C3-C4. Soft tissues and spinal canal: No  prevertebral fluid or swelling. No visible canal hematoma. Disc levels: Degenerative changes in the cervical spine.No high-grade spinal canal stenosis. Upper chest: No focal pulmonary opacity or pleural effusion. Emphysema. IMPRESSION: 1. No acute intracranial process. 2. No acute fracture or traumatic listhesis in the cervical spine. Electronically Signed   By: Wiliam Ke M.D.   On: 02/17/2023 17:16   CT Cervical Spine Wo Contrast Result Date: 02/17/2023 CLINICAL DATA:  Multiple falls and weakness, head and neck trauma EXAM: CT HEAD WITHOUT CONTRAST CT CERVICAL SPINE WITHOUT CONTRAST TECHNIQUE: Multidetector CT imaging of the head and cervical spine was performed following the standard protocol without intravenous contrast. Multiplanar CT image reconstructions of the cervical spine were also generated. RADIATION DOSE REDUCTION: This exam was performed according to the departmental dose-optimization program which includes automated exposure control, adjustment of the mA and/or kV according to patient size and/or use of iterative reconstruction technique. COMPARISON:  11/05/2022 CT head, 06/23/2009 CT cervical spine FINDINGS: CT HEAD FINDINGS Brain: No evidence of acute infarct, hemorrhage, mass, mass effect, or midline shift. No hydrocephalus or extra-axial fluid collection. Periventricular white matter changes, likely the sequela of chronic small vessel ischemic disease. Age related cerebral atrophy. Remote lacunar infarct in the left basal ganglia. Vascular: No hyperdense vessel. Atherosclerotic calcifications in the intracranial carotid and vertebral arteries. Skull: Negative for fracture or focal lesion. Sinuses/Orbits: Persistent opacification of the right sphenoid sinus with osseous thickening, consistent with chronic sinusitis. Status post bilateral lens replacements. Other: The mastoid air cells are well aerated. CT CERVICAL SPINE FINDINGS Alignment: No traumatic listhesis. Trace anterolisthesis of C7  on T1, which appears degenerative. Skull base and vertebrae: No acute fracture or suspicious osseous lesion. Posterior fusion C3-C4. Soft tissues and spinal canal: No prevertebral fluid or swelling. No visible canal hematoma. Disc levels: Degenerative changes in the cervical spine.No high-grade spinal canal stenosis. Upper chest: No focal pulmonary opacity or pleural effusion. Emphysema. IMPRESSION: 1. No acute intracranial process. 2. No acute fracture or traumatic  listhesis in the cervical spine. Electronically Signed   By: Wiliam Ke M.D.   On: 02/17/2023 17:16   DG Elbow 2 Views Right Result Date: 02/17/2023 CLINICAL DATA:  Fall.  Elbow pain. EXAM: RIGHT ELBOW - 2 VIEW COMPARISON:  None Available. FINDINGS: No acute fracture or dislocation. No aggressive osseous lesion. Mild degenerative changes of elbow joint. Olecranon enthesophyte noted. No radiopaque foreign bodies. Soft tissues are within normal limits. IMPRESSION: *No acute osseous abnormality of the right elbow. Electronically Signed   By: Jules Schick M.D.   On: 02/17/2023 16:35   MR Brain W Wo Contrast Result Date: 01/30/2023 CLINICAL DATA:  Memory loss. EXAM: MRI HEAD WITHOUT AND WITH CONTRAST TECHNIQUE: Multiplanar, multiecho pulse sequences of the brain and surrounding structures were obtained without and with intravenous contrast. CONTRAST:  9 mL Vueway. COMPARISON:  MRI brain 02/16/2019.  Head CT 11/05/2022. FINDINGS: Brain: No acute infarct or hemorrhage. 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 Few foci of chronic microhemorrhage in the bilateral cerebellar hemispheres. No hydrocephalus or extra-axial collection. No mass or midline shift. Faint, punctate foci of enhancement within the posterior aspect of the right superior frontal gyrus (axial image 129 series 119) in the  anteromedial aspect of the left cerebellar hemisphere (axial image 32 series 119) without associated signal abnormality on other sequences. No other foci of abnormal enhancement. Vascular: Normal flow voids and vessel enhancement. Skull and upper cervical spine: Normal marrow signal and enhancement. Sinuses/Orbits: No acute findings. Unchanged chronic right sphenoid sinusitis. Other: None. IMPRESSION: 1. Faint, punctate foci of enhancement within the posterior aspect of the right superior frontal gyrus and anteromedial aspect of the left cerebellar hemisphere without associated signal abnormality on other sequences. These are favored to represent late subacute infarcts. Recommend follow-up MRI in 3 months to ensure resolution. 2. 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 Electronically Signed   By: Orvan Falconer M.D.   On: 01/30/2023 11:35       Assessment & Plan DOE (dyspnea on exertion) He presents with worsening shortness of breath, wheezing, and bilateral crackles in the lower lung fields, which have intensified since yesterday. Significant wheezing today suggests heart failure with pulmonary congestion, likely cardiac in origin. There are no signs of fever or pneumonia, and valve sounds are normal. Due to the progressive worsening of severity of severe respiratory symptoms and potential need for diuretic therapy monitoring and echocardiogram cardiogram for suspected acute decompensated CHF,, ER monitoring is recommended. Discussed the necessity of ER treatment for safe diuretic administration and serial blood work monitoring.  He will be referred to the ER for monitoring and evaluation, with orders for an EKG, chest X-ray, and blood work. Diuretic therapy will be considered in the ER.  Also I believe an acute coronary syndrome should be ruled out  despite absence of chest pain due to new bibasilar crackles.  He has history of chronic kidney disease i'm also concerned for acute renal failure and blood pressure is quite elevated as well, each of these issues make it too difficult to safely manage his condition in an outpatient setting.    Patient and his son agreed to progress directly to [facility] emergency room.  Discussed I think there is high possibility of hospital admission  due to condition severity and underlying cause is unclear to me at this time but most consistent with ADCHF. He and his companion prefer a nearby ER for quicker access, even if a subsequent transfer for admission is needed, to avoid long waiting times experienced at other facilities. Proceed to the nearby ER for quicker access and potential transfer if admission is required.       Orders Placed During this Encounter:  Considered/offered ordering EKG/labs/CXR here on site but advised patient that its likely best to get this all done in nearby emergency room while being monitored, given severe dyspnea, so no orders placed            **This document was synthesized by artificial intelligence (Abridge) using HIPAA-compliant recording of the clinical interaction;   We discussed the use of AI scribe software for clinical note transcription with the patient, who gave verbal consent to proceed.    Additional Info: This encounter employed state-of-the-art, real-time, collaborative documentation. The patient actively reviewed and assisted in updating their electronic medical record on a shared screen, ensuring transparency and facilitating joint problem-solving for the problem list, overview, and plan. This approach promotes accurate, informed care. The treatment plan was discussed and reviewed in detail, including medication safety, potential side effects, and all patient questions. We confirmed understanding and comfort with the plan. Follow-up instructions  were established, including contacting the office for any concerns, returning if symptoms worsen, persist, or new symptoms develop, and precautions for potential emergency department visits.

## 2023-04-20 NOTE — ED Triage Notes (Signed)
 Pt POV from home c/o SOB. PCP recommended ER due to recent tx for sepsis and concern for ongoing complications. Pt CAOx4, tachypneic, pale

## 2023-04-20 NOTE — Consult Note (Signed)
 Cardiology Consultation   Patient ID: Michael Bender MRN: [id number]; DOB: 07/18/1945  Admit date: 04/20/2023 Date of Consult: 04/20/2023  PCP:  Lula Olszewski, MD   [facility] Providers Cardiologist:  :1}     Patient Profile:   Michael Bender is a 78 y.o. male with a hx of HTN and emphysema who is being seen 04/20/2023 for the evaluation of CHF at the request of Dr Hanley Ben.  History of Present Illness:   Michael Bender is a 78 yo with hx of HTN, HL, diastolic dysfunction on echo, orthostatic hypotension with recurrent syncope in setting of bacteremia (admit in Oct 2024), PAF (Oct 2024 in setting of infection; not felt to be anticoag candidate due to falls) rhabdomyolysis (traumatic), BPH CVA, PAD, emphysema, GERD, DJD,  Presented to ER today with worsening SOB over the past 4 days    Before taht he could walk 1/2 mile  IN the past 4 days he has had  SOB walking room to room  No weight gain, no PND  No CP      Pt also notes significant wheezing.  Denies recent URI    Symptoms worse with bending   The pt denies dizziness  No syncope   On arrival to ER was reported wheezing with crackles on lung exam   Initial BP 177/75  T 98.4  O2 sat RA 89%   Reported to have LE edema    Portable CXR with cardiomegaly, vascular congestion and small bilateral pleural effusions  Labs significant for BNP 2352   Trop 79   Pt says his breathing is still very short.  He got SOB going from commode to bed   Echo in Oct 2024 LVEF and RVEF normal    Reported to have Gr II diastolic dysfunction (yet atria normal in size)    Past Medical History:  Diagnosis Date   Arthritis    Back   Balance problems 02/21/2023   Blood transfusion    as a child   Chronic back pain    COPD (chronic obstructive pulmonary disease) (HCC)    Disturbance of skin sensation 05/22/2018   Effusion of right knee 02/21/2023   Effusion of right knee joint 01/16/2023   Essential hypertension 03/05/2007   Qualifier:  Diagnosis of   By: Tawanna Cooler MD, Tinnie Gens A      Gastric erosion    Gastritis and gastroduodenitis    Gastrointestinal hemorrhage with melena 11/20/2018   GIB (gastrointestinal bleeding) 07/18/2019   Headache(784.0)    last one 6 months ago   Hemorrhoids, internal    History of duodenal ulcer    History of lumbar fusion 03/03/2022   RE-OPERATIVE DIAGNOSIS:  lumbar stenosis synovial cyst lumbar spondylosis spondylolisthesis lumbar radiculoapthy L4/5   PROCEDURE:  Procedure(s): POSTERIOR LUMBAR FUSION 1 LEVEL with resection of synovial cyst     History of upper gastrointestinal bleeding 03/03/2022   Duodenal ulcer 2021 Dr. [name]   HLD (hyperlipidemia)    Hypertension    Loss of weight    Wt Readings from Last 10 Encounters:  04/05/22  146 lb (66.2 kg)  03/03/22  143 lb 9.6 oz (65.1 kg)  07/14/21  153 lb (69.4 kg)  12/14/20  147 lb 12.8 oz (67 kg)  11/13/19  154 lb (69.9 kg)  09/16/19  146 lb (66.2 kg)  08/06/19  146 lb (66.2 kg)  07/19/19  144 lb 13.5 oz (65.7 kg)  07/17/19  148 lb (67.1 kg)  07/09/19  147 lb 9.6 oz (67 kg)         Neck rigidity    post cervical fusion   Nocturia    PAIN, CHRONIC NEC 10/06/2006   Qualifier: Diagnosis of   By: Tawanna Cooler RN, Ellen       Pneumonia    Prostate disease    S/P cervical spinal fusion 03/03/2022   With persistent cervical pain and palpable screws Led to disability History of attempt to dig furrow in skull to fix it Last surgery 1992    Past Surgical History:  Procedure Laterality Date   BIOPSY  07/19/2019   Procedure: BIOPSY;  Surgeon: Shellia Cleverly, DO;  Location: [facility];  Service: Gastroenterology;;   CERVICAL FUSION  1992   C2/C 3  four surgeries   COLONOSCOPY     ESOPHAGOGASTRODUODENOSCOPY  07/20/2011   Procedure: ESOPHAGOGASTRODUODENOSCOPY (EGD);  Surgeon: [name], MD;  Location: [facility];  Service: Endoscopy;  Laterality: N/A;   ESOPHAGOGASTRODUODENOSCOPY (EGD) WITH PROPOFOL N/A 07/19/2019   Procedure:  ESOPHAGOGASTRODUODENOSCOPY (EGD) WITH PROPOFOL;  Surgeon: Shellia Cleverly, DO;  Location: [facility];  Service: Gastroenterology;  Laterality: N/A;   I & D EXTREMITY Left 11/09/2022   Procedure: IRRIGATION AND DEBRIDEMENT LEFT WRIST;  Surgeon: Samuella Cota, MD;  Location: [facility] OR;  Service: Orthopedics;  Laterality: Left;   SAVORY DILATION  07/20/2011   Procedure: SAVORY DILATION;  Surgeon: [name], MD;  Location: [facility];  Service: Endoscopy;  Laterality: N/A;   SPINAL FUSION  05/06/11     Home Medications:  Prior to Admission medications   Medication Sig Start Date End Date Taking? Authorizing Provider  amitriptyline (ELAVIL) 50 MG tablet Take 1 tablet (50 mg total) by mouth at bedtime. 03/22/23  Yes Lula Olszewski, MD  cyclobenzaprine (FLEXERIL) 10 MG tablet Take 1 tablet (10 mg total) by mouth 3 (three) times daily as needed. 03/22/23  Yes Lula Olszewski, MD  Oxycodone HCl 10 MG TABS Take 1 tablet (10 mg total) by mouth every 8 (eight) hours as needed. 03/22/23  Yes Lula Olszewski, MD  pantoprazole (PROTONIX) 40 MG tablet TAKE 1 TABLET BY MOUTH EVERY DAY BEFORE BREAKFAST 04/18/23  Yes Lula Olszewski, MD  rosuvastatin (CRESTOR) 40 MG tablet TAKE 1 TABLET BY MOUTH DAILY. REPLACES ATORVASTATIN (STOP ATORVASTATIN IF STILL TAKING) 04/13/23  Yes Lula Olszewski, MD  amLODipine (NORVASC) 10 MG tablet Take 1 tablet (10 mg total) by mouth daily. Patient not taking: Reported on 04/20/2023 11/03/22   Glade Lloyd, MD  cefadroxil (DURICEF) 500 MG capsule Take 2 capsules (1,000 mg total) by mouth 2 (two) times daily. Patient not taking: Reported on 04/20/2023 01/17/23   Daiva Eves, Lisette Grinder, MD  ipratropium-albuterol (DUONEB) 0.5-2.5 (3) MG/3ML SOLN Take 3 mLs by nebulization every 4 (four) hours as needed. 04/19/23   Lula Olszewski, MD  metoprolol tartrate (LOPRESSOR) 25 MG tablet Take 1 tablet (25 mg total) by mouth 2 (two) times daily. Patient not taking: Reported on 04/20/2023  11/14/22   Marinda Elk, MD  polyethylene glycol (MIRALAX / GLYCOLAX) 17 g packet Take 17 g by mouth daily as needed for moderate constipation. Patient not taking: Reported on 04/20/2023 11/02/22   Glade Lloyd, MD  potassium chloride SA (KLOR-CON M) 20 MEQ tablet Take 1 tablet (20 mEq total) by mouth 2 (two) times daily. Patient not taking: Reported on 04/20/2023 11/02/22   Glade Lloyd, MD  Respiratory Therapy Supplies (NEBULIZER/TUBING/MOUTHPIECE) KIT 1 each by Does  not apply route every 4 (four) hours as needed. 04/19/23   Lula Olszewski, MD  Respiratory Therapy Supplies (NEBULIZER/TUBING/MOUTHPIECE) KIT 1 each by Does not apply route every 4 (four) hours as needed. 04/19/23   Lula Olszewski, MD  SUMAtriptan (IMITREX) 50 MG tablet TAKE 1 TABLET (50 MG TOTAL) BY MOUTH DAILY. MAY REPEAT IN 2 HOURS IF HEADACHE PERSISTS OR RECURS. Patient not taking: Reported on 04/20/2023 11/18/22   Lula Olszewski, MD    Inpatient Medications: Scheduled Meds:  amitriptyline  50 mg Oral QHS   enoxaparin (LOVENOX) injection  40 mg Subcutaneous Q24H   furosemide  40 mg Intravenous BID   pantoprazole  40 mg Oral Daily   [START ON 04/21/2023] pneumococcal 20-valent conjugate vaccine  0.5 mL Intramuscular Tomorrow-1000   rosuvastatin  40 mg Oral QHS   senna-docusate  1 tablet Oral BID   Continuous Infusions:  PRN Meds: acetaminophen **OR** acetaminophen, albuterol, ondansetron **OR** ondansetron (ZOFRAN) IV, Oxycodone HCl, polyethylene glycol  Allergies:    Allergies  Allergen Reactions   Nubain [Nalbuphine Hcl]     Muscle contraction    Social History:   Social History   Socioeconomic History   Marital status: Divorced    Spouse name: Not on file   Number of children: 2   Years of education: Not on file   Highest education level: Not on file  Occupational History   Occupation: Disabled  Tobacco Use   Smoking status: Some Days    Current packs/day: 0.00    Types: Cigarettes     Start date: 12/12/1978    Last attempt to quit: 12/12/2018    Years since quitting: 4.3   Smokeless tobacco: Never  Vaping Use   Vaping status: Never Used  Substance and Sexual Activity   Alcohol use: Not Currently    Alcohol/week: 2.0 standard drinks of alcohol    Types: 2 Shots of liquor per week   Drug use: No   Sexual activity: Not on file  Other Topics Concern   Not on file  Social History Narrative   Patient is divorced 2 sons he is a retired Engineer, 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  He drinks about a liter of caffeinated drinks a day past smoker no drug use no alcohol.   Social Drivers of Corporate investment banker Strain: Low Risk  (10/13/2022)   Overall Financial Resource Strain (CARDIA)    Difficulty of Paying Living Expenses: Not hard at all  Food Insecurity: No Food Insecurity (04/20/2023)   Hunger Vital Sign    Worried About Running Out of Food in the Last Year: Never true    Ran Out of Food in the Last Year: Never true  Transportation Needs: No Transportation Needs (04/20/2023)   PRAPARE - Administrator, Civil Service (Medical): No    Lack of Transportation (Non-Medical): No  Physical Activity: Insufficiently Active (10/13/2022)   Exercise Vital Sign    Days of Exercise per Week: 3 days    Minutes of Exercise per Session: 30 min  Stress: No Stress Concern Present (10/13/2022)   [employer] of Occupational Health - Occupational Stress Questionnaire    Feeling of Stress : Not at all  Social Connections: Socially Isolated (04/20/2023)   Social Connection and Isolation Panel [NHANES]    Frequency of Communication with Friends and Family: Twice a week    Frequency of Social Gatherings with Friends and Family:  More than three times a week    Attends Religious Services: Never    Active Member of Clubs or Organizations: No    Attends Banker Meetings: Never    Marital Status: Widowed  Intimate Partner  Violence: Not At Risk (04/20/2023)   Humiliation, Afraid, Rape, and Kick questionnaire    Fear of Current or Ex-Partner: No    Emotionally Abused: No    Physically Abused: No    Sexually Abused: No    Family History:    Family History  Problem Relation Age of Onset   Heart disease Mother    Heart attack Mother 25   Pancreatic cancer Father 25   Pancreatic cancer Brother    Anesthesia problems Neg Hx    Colon cancer Neg Hx    Liver cancer Neg Hx    Stomach cancer Neg Hx    Esophageal cancer Neg Hx    Rectal cancer Neg Hx      ROS:  Please see the history of present illness.   All other ROS reviewed and negative.     Physical Exam/Data:   Vitals:   04/20/23 1000 04/20/23 1313 04/20/23 1351 04/20/23 1641  BP: (!) 176/68   (!) 183/83  Pulse: 80   88  Resp: 18     Temp:  98.1 F (36.7 C)  97.8 F (36.6 C)  TempSrc:  Oral  Oral  SpO2: 95%  95% 98%  Weight:    67.3 kg  Height:    5\' 11"  (1.803 m)    Intake/Output Summary (Last 24 hours) at 04/20/2023 1731 Last data filed at 04/20/2023 1700 Gross per 24 hour  Intake 195.87 ml  Output 450 ml  Net -254.13 ml      04/20/2023    4:41 PM 04/20/2023    8:01 AM 03/22/2023    9:41 AM  Last 3 Weights  Weight (lbs) 148 lb 5.9 oz 153 lb 12.8 oz 143 lb  Weight (kg) 67.3 kg 69.763 kg 64.864 kg     Body mass index is 20.69 kg/m.  General:  Thin 78 yo who appears mildly SOB with talking  HEENT: normal Neck: +HJR    Vascular: No carotid bruits; Distal pulses 2+ bilaterally Cardiac:  normal S1, S2; RRR; no murmurs   PMI diffuse Lungs: Decreased airflow   Wheezes Rales at bases with decreased BS at L base  Abd: soft  MIld RUQ tenderness Ext: no edema Musculoskeletal:  No deformities, BUE and BLE strength normal and equal Skin: warm and dry  Neuro:  CNs 2-12 intact, no focal abnormalities noted Psych:  Normal affect   EKG:  The EKG was personally reviewed and demonstrates:  SR 82 bpme  Nonspecific ST changes  Prolonged QT   QTc 560   REpeat EKG (when K repleted) reported to show QT interval had improved   Telemetry:  Telemetry was personally reviewed and demonstrates:  not on tele yet    Relevant CV Studies: Echo   Oct 2024  1. Left ventricular ejection fraction, by estimation, is 60 to 65%. The  left ventricle has normal function. The left ventricle has no regional  wall motion abnormalities. Left ventricular diastolic parameters are  consistent with Grade II diastolic  dysfunction (pseudonormalization).   2. Right ventricular systolic function is normal. The right ventricular  size is normal. There is normal pulmonary artery systolic pressure.   3. The mitral valve is normal in structure. No evidence of mitral valve  regurgitation. No  evidence of mitral stenosis.   4. The aortic valve is tricuspid. Aortic valve regurgitation is not  visualized. No aortic stenosis is present.   5. The inferior vena cava is normal in size with greater than 50%  respiratory variability, suggesting right atrial pressure of 3 mmHg.    Laboratory Data:  High Sensitivity Troponin:   Recent Labs  Lab 04/20/23 0948 04/20/23 1120  TROPONINIHS 79* 72*     Chemistry Recent Labs  Lab 04/20/23 0948  NA 137  K 2.8*  CL 102  CO2 27  GLUCOSE 135*  BUN 17  CREATININE 1.39*  CALCIUM 8.1*  MG 1.9  GFRNONAA 52*  ANIONGAP 8    No results for input(s): "PROT", "ALBUMIN", "AST", "ALT", "ALKPHOS", "BILITOT" in the last 168 hours. Lipids No results for input(s): "CHOL", "TRIG", "HDL", "LABVLDL", "LDLCALC", "CHOLHDL" in the last 168 hours.  Hematology Recent Labs  Lab 04/20/23 0948  WBC 6.6  RBC 2.89*  HGB 8.7*  HCT 26.3*  MCV 91.0  MCH 30.1  MCHC 33.1  RDW 14.2  PLT 224   Thyroid No results for input(s): "TSH", "FREET4" in the last 168 hours.  BNP Recent Labs  Lab 04/20/23 0943  BNP 2,352.1*    DDimer No results for input(s): "DDIMER" in the last 168 hours.   Radiology/Studies:  [facility] Chest Port 1  View Result Date: 04/20/2023 CLINICAL DATA:  Shortness of breath. EXAM: PORTABLE CHEST 1 VIEW COMPARISON:  Chest radiograph dated 11/05/2022. FINDINGS: Cardiomegaly with vascular congestion. Small bilateral flu shin is and bibasilar atelectasis, left greater than right. Pneumonia is not excluded. No pneumothorax. Atherosclerotic calcification of the aorta. No acute osseous pathology. IMPRESSION: 1. Cardiomegaly with vascular congestion. 2. Small bilateral pleural effusions and bibasilar atelectasis. Electronically Signed   By: Elgie Collard M.D.   On: 04/20/2023 10:32     Assessment and Plan:   CHF  PT with about a 5 day hx of progressive SOB   Does have a hx of COPD    Here in ER hypoxic, wheezing  CXR with cngestion, BNP elevated at 2351     Agree with IV lasix    Given elevated BP will add NTG paste to lower, help with diuresis   Would recomm echo to reevaluate LV and RV function      2  Elevated troponin  Trivial, flat  (79, 72)  May represent demand ischemia in setting of hypoxia   Follow  Pt without CP   3  PAF Episode occurred in setting of illness in Oct 2024   Currently HR regular  EKG showed SR    Continue tele  4  HTN  BP is severely elevated now   He is on amlodipine at home   WIll confirm if he took it today   5  Orthostatic hypotension / syncope   Occurred in setting of infection/bacteremia   Follow   6   Renal  BUN /CR today 17/ 1.39  Was 1.77 - 2.1 in Jan 2025  7  Electrolytes   REplete K   Follow MG   8 ANemia Hgb 8.7  FOllow    Has been 7.1-9.4 in past 6 months         For questions or updates, please contact [facility] Please consult [email or web address] for contact info under    Signed, Dietrich Pates, MD  04/20/2023 5:31 PM

## 2023-04-20 NOTE — ED Provider Notes (Signed)
 [place] EMERGENCY DEPARTMENT AT [facility] Provider Note   CSN: [id number] Arrival date & time: 04/20/23  8657     History Chief Complaint  Patient presents with   Shortness of Breath    Michael Bender is a 78 y.o. male patient with history of hyperlipidemia, hypertension who presents to the emergency department with shortness of breath that has been progressively worsening over the last 4 days.  Patient states that he can normally perform his ADLs without any difficulty but now is having trouble walking from room to room without feeling short of breath.  He does endorse shortness of breath at baseline but is primarily worse with exertion.  He denies any recent weight gain, orthopnea, or PND.  Also denies chest pain, fever, abdominal pain, nausea, vomiting, diarrhea.  He does endorse some associated cough and sore throat which started today.  Patient does not use oxygen at baseline and denies any past medical history of chronic lung disease.  Patient was a habitual smoker for 50 years about a pack a day.   Shortness of Breath      Home Medications Prior to Admission medications   Medication Sig Start Date End Date Taking? Authorizing Provider  amitriptyline (ELAVIL) 50 MG tablet Take 1 tablet (50 mg total) by mouth at bedtime. 03/22/23  Yes Lula Olszewski, MD  cyclobenzaprine (FLEXERIL) 10 MG tablet Take 1 tablet (10 mg total) by mouth 3 (three) times daily as needed. 03/22/23  Yes Lula Olszewski, MD  Oxycodone HCl 10 MG TABS Take 1 tablet (10 mg total) by mouth every 8 (eight) hours as needed. 03/22/23  Yes Lula Olszewski, MD  pantoprazole (PROTONIX) 40 MG tablet TAKE 1 TABLET BY MOUTH EVERY DAY BEFORE BREAKFAST 04/18/23  Yes Lula Olszewski, MD  rosuvastatin (CRESTOR) 40 MG tablet TAKE 1 TABLET BY MOUTH DAILY. REPLACES ATORVASTATIN (STOP ATORVASTATIN IF STILL TAKING) 04/13/23  Yes Lula Olszewski, MD  amLODipine (NORVASC) 10 MG tablet Take 1 tablet (10 mg total) by  mouth daily. Patient not taking: Reported on 04/20/2023 11/03/22   Glade Lloyd, MD  cefadroxil (DURICEF) 500 MG capsule Take 2 capsules (1,000 mg total) by mouth 2 (two) times daily. Patient not taking: Reported on 04/20/2023 01/17/23   Daiva Eves, Lisette Grinder, MD  ipratropium-albuterol (DUONEB) 0.5-2.5 (3) MG/3ML SOLN Take 3 mLs by nebulization every 4 (four) hours as needed. 04/19/23   Lula Olszewski, MD  metoprolol tartrate (LOPRESSOR) 25 MG tablet Take 1 tablet (25 mg total) by mouth 2 (two) times daily. Patient not taking: Reported on 04/20/2023 11/14/22   Marinda Elk, MD  polyethylene glycol (MIRALAX / GLYCOLAX) 17 g packet Take 17 g by mouth daily as needed for moderate constipation. Patient not taking: Reported on 04/20/2023 11/02/22   Glade Lloyd, MD  potassium chloride SA (KLOR-CON M) 20 MEQ tablet Take 1 tablet (20 mEq total) by mouth 2 (two) times daily. Patient not taking: Reported on 04/20/2023 11/02/22   Glade Lloyd, MD  Respiratory Therapy Supplies (NEBULIZER/TUBING/MOUTHPIECE) KIT 1 each by Does not apply route every 4 (four) hours as needed. 04/19/23   Lula Olszewski, MD  Respiratory Therapy Supplies (NEBULIZER/TUBING/MOUTHPIECE) KIT 1 each by Does not apply route every 4 (four) hours as needed. 04/19/23   Lula Olszewski, MD  SUMAtriptan (IMITREX) 50 MG tablet TAKE 1 TABLET (50 MG TOTAL) BY MOUTH DAILY. MAY REPEAT IN 2 HOURS IF HEADACHE PERSISTS OR RECURS. Patient not taking: Reported on 04/20/2023 11/18/22  Lula Olszewski, MD      Allergies    Nubain [nalbuphine hcl]    Review of Systems   Review of Systems  Respiratory:  Positive for shortness of breath.   All other systems reviewed and are negative.   Physical Exam Updated Vital Signs BP (!) 176/68   Pulse 80   Temp 98.1 F (36.7 C) (Oral)   Resp 18   SpO2 95%  Physical Exam Vitals and nursing note reviewed.  Constitutional:      General: He is not in acute distress.    Appearance: Normal  appearance.  HENT:     Head: Normocephalic and atraumatic.  Eyes:     General:        Right eye: No discharge.        Left eye: No discharge.  Cardiovascular:     Comments: Regular rate and rhythm.  S1/S2 are distinct without any evidence of murmur, rubs, or gallops.  Radial pulses are 2+ bilaterally.  Dorsalis pedis pulses are 2+ bilaterally.   Pulmonary:     Effort: Pulmonary effort is normal.     Breath sounds: Wheezing present.     Comments: Expiratory wheeze heard diffusely across all lung fields. Abdominal:     General: Abdomen is flat. Bowel sounds are normal. There is no distension.     Tenderness: There is no abdominal tenderness. There is no guarding or rebound.  Musculoskeletal:        General: Normal range of motion.     Cervical back: Neck supple.     Right lower leg: 1+ Pitting Edema present.     Left lower leg: 1+ Pitting Edema present.  Skin:    General: Skin is warm and dry.     Findings: No rash.  Neurological:     General: No focal deficit present.     Mental Status: He is alert.  Psychiatric:        Mood and Affect: Mood normal.        Behavior: Behavior normal.     ED Results / Procedures / Treatments   Labs (all labs ordered are listed, but only abnormal results are displayed) Labs Reviewed  CBC WITH DIFFERENTIAL/PLATELET - Abnormal; Notable for the following components:      Result Value   RBC 2.89 (*)    Hemoglobin 8.7 (*)    HCT 26.3 (*)    All other components within normal limits  BASIC METABOLIC PANEL - Abnormal; Notable for the following components:   Potassium 2.8 (*)    Glucose, Bld 135 (*)    Creatinine, Ser 1.39 (*)    Calcium 8.1 (*)    GFR, Estimated 52 (*)    All other components within normal limits  BRAIN NATRIURETIC PEPTIDE - Abnormal; Notable for the following components:   B Natriuretic Peptide 2,352.1 (*)    All other components within normal limits  TROPONIN I (HIGH SENSITIVITY) - Abnormal; Notable for the following  components:   Troponin I (High Sensitivity) 79 (*)    All other components within normal limits  TROPONIN I (HIGH SENSITIVITY) - Abnormal; Notable for the following components:   Troponin I (High Sensitivity) 72 (*)    All other components within normal limits  RESP PANEL BY RT-PCR (RSV, FLU A&B, COVID)  RVPGX2  MAGNESIUM    EKG EKG Interpretation Date/Time:  Thursday April 20 2023 09:47:05 EDT Ventricular Rate:  82 PR Interval:  179 QRS Duration:  90 QT Interval:  482 QTC Calculation: 563 R Axis:   71  Text Interpretation: Age not entered, assumed to be  78 years old for purpose of ECG interpretation Sinus rhythm Left ventricular hypertrophy Nonspecific T abnormalities, lateral leads Prolonged QT interval Confirmed by Derwood Kaplan [phone]) on 04/20/2023 10:49:24 AM  Radiology DG Chest Port 1 View Result Date: 04/20/2023 CLINICAL DATA:  Shortness of breath. EXAM: PORTABLE CHEST 1 VIEW COMPARISON:  Chest radiograph dated 11/05/2022. FINDINGS: Cardiomegaly with vascular congestion. Small bilateral flu shin is and bibasilar atelectasis, left greater than right. Pneumonia is not excluded. No pneumothorax. Atherosclerotic calcification of the aorta. No acute osseous pathology. IMPRESSION: 1. Cardiomegaly with vascular congestion. 2. Small bilateral pleural effusions and bibasilar atelectasis. Electronically Signed   By: Elgie Collard M.D.   On: 04/20/2023 10:32    Procedures .Critical Care  Performed by: Teressa Lower, PA-C Authorized by: Teressa Lower, PA-C   Critical care provider statement:    Critical care time (minutes):  35   Critical care time was exclusive of:  Separately billable procedures and treating other patients   Critical care was necessary to treat or prevent imminent or life-threatening deterioration of the following conditions:  Cardiac failure   Critical care was time spent personally by me on the following activities:  Blood draw for specimens,  development of treatment plan with patient or surrogate, discussions with consultants, ordering and review of radiographic studies, ordering and review of laboratory studies and pulse oximetry     Medications Ordered in ED Medications  potassium chloride SA (KLOR-CON M) CR tablet 40 mEq (has no administration in time range)  ipratropium-albuterol (DUONEB) 0.5-2.5 (3) MG/3ML nebulizer solution 3 mL (3 mLs Nebulization Given 04/20/23 0857)  ipratropium-albuterol (DUONEB) 0.5-2.5 (3) MG/3ML nebulizer solution 3 mL (3 mLs Nebulization Given 04/20/23 0955)  potassium chloride 10 mEq in 100 mL IVPB (10 mEq Intravenous New Bag/Given 04/20/23 1237)  potassium chloride SA (KLOR-CON M) CR tablet 40 mEq (40 mEq Oral Given 04/20/23 1115)  furosemide (LASIX) injection 40 mg (40 mg Intravenous Given 04/20/23 1310)    ED Course/ Medical Decision Making/ A&P Clinical Course as of 04/20/23 1341  Thu Apr 20, 2023  1337 I spoke with Dr. Randol Kern with [facility] hospitalist who agrees to admit the patient. [CF]  1337 Resp panel by RT-PCR (RSV, Flu A&B, Covid) Anterior Nasal Swab Negative. [CF]  1337 CBC with Differential(!) There is some evidence of anemia which seems to be at baseline for patient. [CF]  1338 Basic metabolic panel(!) Hypokalemia.  Magnesium is normal.  Will replete this.  Elevated creatinine but better than patient's normal baseline. [CF]  1338 Troponin I (High Sensitivity)(!) Initial and delta troponin are elevated.  There appears to be a chronic troponin leak. [CF]  1338 Brain natriuretic peptide(!) Significantly elevated from patient's baseline. [CF]  1338 DG Chest [facility] I personally ordered and interpreted the study.  There is some evidence of pleural effusion.  I do agree with radiologist interpretation. [CF]    Clinical Course User Index [CF] Teressa Lower, PA-C   {   Click here for ABCD2, HEART and other calculators  Medical Decision Making DAMICO PARTIN is a 78 y.o.  male patient who presents to the emergency department today for further evaluation of shortness of breath.  There is certainly some expiratory wheeze heard throughout all lung fields.  Patient did receive nebulizer treatment here.  I would give him another one.  This seems to be primarily related to  the lungs although given his pedal edema will look for cardiac causes as well.  Low suspicion at this time for ACS.  Will also look for infectious causes concern that the patient did have a pretty severe episode of sepsis in late 2024 according to chart review.  Patient otherwise in no acute distress.  Oxygenation is 93% on room air.  Given the patient's oxygenation status I do feel the patient would likely need to be admitted for diuresis and management of his heart failure.  Patient is stable for admission at this time.  Potassium is actively being repleted.  Patient received Lasix.  He will be admitted to the hospital service.  Amount and/or Complexity of Data Reviewed Labs: ordered. Decision-making details documented in ED Course. Radiology: ordered. Decision-making details documented in ED Course.  Risk Prescription drug management. Decision regarding hospitalization.    Final Clinical Impression(s) / ED Diagnoses Final diagnoses:  Acute congestive heart failure, unspecified heart failure type [facility])  Shortness of breath    Rx / DC Orders ED Discharge Orders     None         Teressa Lower, [place] 04/20/23 1341    Derwood Kaplan, MD 04/24/23 [phone]

## 2023-04-20 NOTE — H&P (Signed)
 History and Physical    AAMARI Bender OVF:[id number] DOB: 1945/04/07 DOA: 04/20/2023  PCP: Lula Olszewski, MD   Patient coming from: Home  I have personally briefly reviewed patient's old medical records in [facility]  Chief Complaint: Shortness of breath  HPI: Michael Bender is a 78 y.o. male with medical history significant of 78 y.o. male with a history of COPD, hypertension, hyperlipidemia, CVA, PAD, chronic pain, constipation, MSSA bacteremia and septic left wrist arthritis requiring surgical intervention by orthopedics and discitis of C5-C6 on MRI of C-spine in 11/2022 treated with 6 weeks of IV antibiotics (end of treatment 12/18/2022) presented with worsening exertional shortness of breath which has worsened since yesterday.  Patient complains of significant wheezing.  Patient denies any chest pain, fever, sick contacts, nausea, vomiting, abdomen pain, diarrhea, dysuria, leg swelling, recent travels or excessive weight loss/weight gain.  Patient was seen at his PCPs office today who was concerned about possible acute CHF and was referred to the ED.  ED Course: He was found to have significant wheezing with pedal edema.  Potassium 2.8, creatinine 1.39, BNP 2352.1, high-sensitivity troponins were 79 and 72.  He was given IV Lasix along with supplemental potassium. Hospitalist service was called to evaluate the patient.  Review of Systems: As per HPI otherwise all other systems were reviewed and are negative.   Past Medical History:  Diagnosis Date   Arthritis    Back   Balance problems 02/21/2023   Blood transfusion    as a child   Chronic back pain    COPD (chronic obstructive pulmonary disease) (HCC)    Disturbance of skin sensation 05/22/2018   Effusion of right knee 02/21/2023   Effusion of right knee joint 01/16/2023   Essential hypertension 03/05/2007   Qualifier: Diagnosis of   By: Tawanna Cooler MD, Eugenio Hoes      Gastric erosion    Gastritis and gastroduodenitis     Gastrointestinal hemorrhage with melena 11/20/2018   GIB (gastrointestinal bleeding) 07/18/2019   Headache(784.0)    last one 6 months ago   Hemorrhoids, internal    History of duodenal ulcer    History of lumbar fusion 03/03/2022   RE-OPERATIVE DIAGNOSIS:  lumbar stenosis synovial cyst lumbar spondylosis spondylolisthesis lumbar radiculoapthy L4/5   PROCEDURE:  Procedure(s): POSTERIOR LUMBAR FUSION 1 LEVEL with resection of synovial cyst     History of upper gastrointestinal bleeding 03/03/2022   Duodenal ulcer 2021 Dr. [name]   HLD (hyperlipidemia)    Hypertension    Loss of weight    Wt Readings from Last 10 Encounters:  04/05/22  146 lb (66.2 kg)  03/03/22  143 lb 9.6 oz (65.1 kg)  07/14/21  153 lb (69.4 kg)  12/14/20  147 lb 12.8 oz (67 kg)  11/13/19  154 lb (69.9 kg)  09/16/19  146 lb (66.2 kg)  08/06/19  146 lb (66.2 kg)  07/19/19  144 lb 13.5 oz (65.7 kg)  07/17/19  148 lb (67.1 kg)  07/09/19  147 lb 9.6 oz (67 kg)         Neck rigidity    post cervical fusion   Nocturia    PAIN, CHRONIC NEC 10/06/2006   Qualifier: Diagnosis of   By: Tawanna Cooler RN, Alvino Chapel       Pneumonia    Prostate disease    S/P cervical spinal fusion 03/03/2022   With persistent cervical pain and palpable screws Led to disability History of attempt to dig furrow in skull  to fix it Last surgery 1992    Past Surgical History:  Procedure Laterality Date   BIOPSY  07/19/2019   Procedure: BIOPSY;  Surgeon: Shellia Cleverly, DO;  Location: [facility];  Service: Gastroenterology;;   CERVICAL FUSION  1992   C2/C 3  four surgeries   COLONOSCOPY     ESOPHAGOGASTRODUODENOSCOPY  07/20/2011   Procedure: ESOPHAGOGASTRODUODENOSCOPY (EGD);  Surgeon: [name], MD;  Location: [facility];  Service: Endoscopy;  Laterality: N/A;   ESOPHAGOGASTRODUODENOSCOPY (EGD) WITH PROPOFOL N/A 07/19/2019   Procedure: ESOPHAGOGASTRODUODENOSCOPY (EGD) WITH PROPOFOL;  Surgeon: Shellia Cleverly, DO;  Location: [facility];   Service: Gastroenterology;  Laterality: N/A;   I & D EXTREMITY Left 11/09/2022   Procedure: IRRIGATION AND DEBRIDEMENT LEFT WRIST;  Surgeon: Samuella Cota, MD;  Location: [facility];  Service: Orthopedics;  Laterality: Left;   SAVORY DILATION  07/20/2011   Procedure: SAVORY DILATION;  Surgeon: [name], MD;  Location: [facility];  Service: Endoscopy;  Laterality: N/A;   SPINAL FUSION  05/06/11     reports that he has been smoking cigarettes. He started smoking about 44 years ago. He has never used smokeless tobacco. He reports that he does not currently use alcohol after a past usage of about 2.0 standard drinks of alcohol per week. He reports that he does not use drugs.  Allergies  Allergen Reactions   Nubain [Nalbuphine Hcl]     Muscle contraction    Family History  Problem Relation Age of Onset   Heart disease Mother    Heart attack Mother 17   Pancreatic cancer Father 51   Pancreatic cancer Brother    Anesthesia problems Neg Hx    Colon cancer Neg Hx    Liver cancer Neg Hx    Stomach cancer Neg Hx    Esophageal cancer Neg Hx    Rectal cancer Neg Hx     Prior to Admission medications   Medication Sig Start Date End Date Taking? Authorizing Provider  amitriptyline (ELAVIL) 50 MG tablet Take 1 tablet (50 mg total) by mouth at bedtime. 03/22/23  Yes Lula Olszewski, MD  cyclobenzaprine (FLEXERIL) 10 MG tablet Take 1 tablet (10 mg total) by mouth 3 (three) times daily as needed. 03/22/23  Yes Lula Olszewski, MD  Oxycodone HCl 10 MG TABS Take 1 tablet (10 mg total) by mouth every 8 (eight) hours as needed. 03/22/23  Yes Lula Olszewski, MD  pantoprazole (PROTONIX) 40 MG tablet TAKE 1 TABLET BY MOUTH EVERY DAY BEFORE BREAKFAST 04/18/23  Yes Lula Olszewski, MD  rosuvastatin (CRESTOR) 40 MG tablet TAKE 1 TABLET BY MOUTH DAILY. REPLACES ATORVASTATIN (STOP ATORVASTATIN IF STILL TAKING) 04/13/23  Yes Lula Olszewski, MD  amLODipine (NORVASC) 10 MG tablet Take 1 tablet (10 mg total)  by mouth daily. Patient not taking: Reported on 04/20/2023 11/03/22   Glade Lloyd, MD  cefadroxil (DURICEF) 500 MG capsule Take 2 capsules (1,000 mg total) by mouth 2 (two) times daily. Patient not taking: Reported on 04/20/2023 01/17/23   Daiva Eves, Lisette Grinder, MD  ipratropium-albuterol (DUONEB) 0.5-2.5 (3) MG/3ML SOLN Take 3 mLs by nebulization every 4 (four) hours as needed. 04/19/23   Lula Olszewski, MD  metoprolol tartrate (LOPRESSOR) 25 MG tablet Take 1 tablet (25 mg total) by mouth 2 (two) times daily. Patient not taking: Reported on 04/20/2023 11/14/22   Marinda Elk, MD  polyethylene glycol (MIRALAX / GLYCOLAX) 17 g packet Take 17 g by mouth daily  as needed for moderate constipation. Patient not taking: Reported on 04/20/2023 11/02/22   Glade Lloyd, MD  potassium chloride SA (KLOR-CON M) 20 MEQ tablet Take 1 tablet (20 mEq total) by mouth 2 (two) times daily. Patient not taking: Reported on 04/20/2023 11/02/22   Glade Lloyd, MD  Respiratory Therapy Supplies (NEBULIZER/TUBING/MOUTHPIECE) KIT 1 each by Does not apply route every 4 (four) hours as needed. 04/19/23   Lula Olszewski, MD  Respiratory Therapy Supplies (NEBULIZER/TUBING/MOUTHPIECE) KIT 1 each by Does not apply route every 4 (four) hours as needed. 04/19/23   Lula Olszewski, MD  SUMAtriptan (IMITREX) 50 MG tablet TAKE 1 TABLET (50 MG TOTAL) BY MOUTH DAILY. MAY REPEAT IN 2 HOURS IF HEADACHE PERSISTS OR RECURS. Patient not taking: Reported on 04/20/2023 11/18/22   Lula Olszewski, MD    Physical Exam: Vitals:   04/20/23 1000 04/20/23 1313 04/20/23 1351 04/20/23 1641  BP: (!) 176/68   (!) 183/83  Pulse: 80   88  Resp: 18     Temp:  98.1 F (36.7 C)  97.8 F (36.6 C)  TempSrc:  Oral  Oral  SpO2: 95%  95% 98%  Weight:    67.3 kg  Height:    5\' 11"  (1.803 m)    Constitutional: NAD, calm, comfortable.  On 2 L oxygen via nasal cannula.  Looks very thinly built.  Looks chronically ill and deconditioned. Vitals:    04/20/23 1000 04/20/23 1313 04/20/23 1351 04/20/23 1641  BP: (!) 176/68   (!) 183/83  Pulse: 80   88  Resp: 18     Temp:  98.1 F (36.7 C)  97.8 F (36.6 C)  TempSrc:  Oral  Oral  SpO2: 95%  95% 98%  Weight:    67.3 kg  Height:    5\' 11"  (1.803 m)   Eyes: PERRL, lids and conjunctivae normal ENMT: Mucous membranes are moist. Posterior pharynx clear of any exudate or lesions. Neck: normal, supple, no masses, no thyromegaly Respiratory: bilateral decreased breath sounds at bases with scattered wheezing and crackles.  Normal respiratory effort. No accessory muscle use.  Cardiovascular: S1 S2 positive, rate controlled.  Trace extremity edema. 2+ pedal pulses.  Abdomen: no tenderness, no masses palpated. No hepatosplenomegaly. Bowel sounds positive.  Musculoskeletal: no clubbing / cyanosis. No joint deformity upper and lower extremities.  Skin: no rashes, lesions, ulcers. No induration Neurologic: CN 2-12 grossly intact. Moving extremities. No focal neurologic deficits.  Psychiatric: Flat affect.  Not agitated.   Labs on Admission: I have personally reviewed following labs and imaging studies  CBC: Recent Labs  Lab 04/20/23 0948  WBC 6.6  NEUTROABS 5.1  HGB 8.7*  HCT 26.3*  MCV 91.0  PLT 224   Basic Metabolic Panel: Recent Labs  Lab 04/20/23 0948  NA 137  K 2.8*  CL 102  CO2 27  GLUCOSE 135*  BUN 17  CREATININE 1.39*  CALCIUM 8.1*  MG 1.9   GFR: Estimated Creatinine Clearance: 41.7 mL/min (A) (by C-G formula based on SCr of 1.39 mg/dL (H)). Liver Function Tests: No results for input(s): "AST", "ALT", "ALKPHOS", "BILITOT", "PROT", "ALBUMIN" in the last 168 hours. No results for input(s): "LIPASE", "AMYLASE" in the last 168 hours. No results for input(s): "AMMONIA" in the last 168 hours. Coagulation Profile: No results for input(s): "INR", "PROTIME" in the last 168 hours. Cardiac Enzymes: No results for input(s): "CKTOTAL", "CKMB", "CKMBINDEX", "TROPONINI" in the  last 168 hours. BNP (last 3 results) No results for input(s): "  PROBNP" in the last 8760 hours. HbA1C: No results for input(s): "HGBA1C" in the last 72 hours. CBG: No results for input(s): "GLUCAP" in the last 168 hours. Lipid Profile: No results for input(s): "CHOL", "HDL", "LDLCALC", "TRIG", "CHOLHDL", "LDLDIRECT" in the last 72 hours. Thyroid Function Tests: No results for input(s): "TSH", "T4TOTAL", "FREET4", "T3FREE", "THYROIDAB" in the last 72 hours. Anemia Panel: No results for input(s): "VITAMINB12", "FOLATE", "FERRITIN", "TIBC", "IRON", "RETICCTPCT" in the last 72 hours. Urine analysis:    Component Value Date/Time   COLORURINE YELLOW 02/17/2023 1754   APPEARANCEUR CLEAR 02/17/2023 1754   LABSPEC 1.011 02/17/2023 1754   PHURINE 5.0 02/17/2023 1754   GLUCOSEU NEGATIVE 02/17/2023 1754   HGBUR MODERATE (A) 02/17/2023 1754   BILIRUBINUR NEGATIVE 02/17/2023 1754   BILIRUBINUR n 09/08/2015 1402   KETONESUR NEGATIVE 02/17/2023 1754   PROTEINUR 30 (A) 02/17/2023 1754   UROBILINOGEN 0.2 09/08/2015 1402   NITRITE NEGATIVE 02/17/2023 1754   LEUKOCYTESUR NEGATIVE 02/17/2023 1754    Radiological Exams on Admission: DG Chest Port 1 View Result Date: 04/20/2023 CLINICAL DATA:  Shortness of breath. EXAM: PORTABLE CHEST 1 VIEW COMPARISON:  Chest radiograph dated 11/05/2022. FINDINGS: Cardiomegaly with vascular congestion. Small bilateral flu shin is and bibasilar atelectasis, left greater than right. Pneumonia is not excluded. No pneumothorax. Atherosclerotic calcification of the aorta. No acute osseous pathology. IMPRESSION: 1. Cardiomegaly with vascular congestion. 2. Small bilateral pleural effusions and bibasilar atelectasis. Electronically Signed   By: Elgie Collard M.D.   On: 04/20/2023 10:32    EKG: Independently reviewed.  No ST elevations or depressions noted.  Assessment/Plan  Probable acute diastolic heart failure Positive troponins: Possibly from demand ischemia Acute  respiratory failure with hypoxia -Presented with worsening shortness of breath with positive troponins, elevated BNP and chest x-ray showing vascular congestion.  Patient does not take any diuretics at home. -Has been started on Lasix IV.  Continue Lasix IV 40 mg twice a day.  Strict input and output.  Daily weights.  Fluid restriction.  Continue metoprolol.  Cardiology evaluation.  Patient does not have an established cardiologist -On 2 L oxygen via nasal cannula.  Wean off as able  Hypokalemia -Replace.  Repeat a.m. labs  Anemia of chronic disease -From chronic illnesses.  Hemoglobin stable.  Monitor intermittently  Current kidney disease stage IIIa -Creatinine currently stable.    Essential hypertension -Monitor blood pressure.  Continue Lasix and metoprolol.  COPD -Continue nebs as needed.  Hyperlipidemia -continue statin  Chronic pain with opiate dependence -Continue home pain medication regimen.  Outpatient follow-up with PCP/pain management  DVT prophylaxis: Lovenox Code Status: Full Family Communication: Son at bedside Disposition Plan: Home in 2 to 3 days once clinically improved Consults called: Sent secure chat to on-call cardiology/Dr. Tenny Craw Admission status: Inpatient/telemetry  Severity of Illness: The appropriate patient status for this patient is INPATIENT. Inpatient status is judged to be reasonable and necessary in order to provide the required intensity of service to ensure the patient's safety. The patient's presenting symptoms, physical exam findings, and initial radiographic and laboratory data in the context of their chronic comorbidities is felt to place them at high risk for further clinical deterioration. Furthermore, it is not anticipated that the patient will be medically stable for discharge from the hospital within 2 midnights of admission.   * I certify that at the point of admission it is my clinical judgment that the patient will require inpatient  hospital care spanning beyond 2 midnights from the point of admission due  to high intensity of service, high risk for further deterioration and high frequency of surveillance required.Glade Lloyd MD [facility]  04/20/2023, 5:22 PM

## 2023-04-20 NOTE — Plan of Care (Signed)
  Problem: Education: Goal: Knowledge of General Education information will improve Description: Including pain rating scale, medication(s)/side effects and non-pharmacologic comfort measures Outcome: Progressing   Problem: Clinical Measurements: Goal: Ability to maintain clinical measurements within normal limits will improve Outcome: Progressing   Problem: Clinical Measurements: Goal: Will remain free from infection Outcome: Progressing   Problem: Clinical Measurements: Goal: Diagnostic test results will improve Outcome: Progressing   Problem: Clinical Measurements: Goal: Cardiovascular complication will be avoided Outcome: Progressing   Problem: Activity: Goal: Risk for activity intolerance will decrease Outcome: Progressing

## 2023-04-20 NOTE — ED Notes (Signed)
 RT Note: Patient having more wheezing and SOB. A Duoneb breathing treatment is ordred

## 2023-04-20 NOTE — ED Notes (Signed)
 RT Note: Upon assessing this patient, his oxygen saturation on room air 86% with good pleth. He was placed on 2lpm Pena to maintain oxygen saturations at 92%

## 2023-04-21 ENCOUNTER — Inpatient Hospital Stay (HOSPITAL_COMMUNITY)

## 2023-04-21 DIAGNOSIS — R0609 Other forms of dyspnea: Secondary | ICD-10-CM | POA: Diagnosis not present

## 2023-04-21 DIAGNOSIS — I5033 Acute on chronic diastolic (congestive) heart failure: Secondary | ICD-10-CM | POA: Diagnosis not present

## 2023-04-21 LAB — ECHOCARDIOGRAM COMPLETE
AR max vel: 2.15 cm2
AV Area VTI: 2.32 cm2
AV Area mean vel: 2.02 cm2
AV Mean grad: 4 mmHg
AV Peak grad: 7.2 mmHg
Ao pk vel: 1.34 m/s
Area-P 1/2: 4.96 cm2
Calc EF: 36.1 %
Height: 71 in
MV VTI: 2.63 cm2
S' Lateral: 4 cm
Single Plane A2C EF: 36.9 %
Single Plane A4C EF: 37.7 %
Weight: 2303.37 [oz_av]

## 2023-04-21 LAB — CBC
HCT: 26.6 % — ABNORMAL LOW (ref 39.0–52.0)
Hemoglobin: 8.7 g/dL — ABNORMAL LOW (ref 13.0–17.0)
MCH: 29.8 pg (ref 26.0–34.0)
MCHC: 32.7 g/dL (ref 30.0–36.0)
MCV: 91.1 fL (ref 80.0–100.0)
Platelets: 225 10*3/uL (ref 150–400)
RBC: 2.92 MIL/uL — ABNORMAL LOW (ref 4.22–5.81)
RDW: 14.3 % (ref 11.5–15.5)
WBC: 5.8 10*3/uL (ref 4.0–10.5)
nRBC: 0 % (ref 0.0–0.2)

## 2023-04-21 LAB — COMPREHENSIVE METABOLIC PANEL
ALT: 7 U/L (ref 0–44)
AST: 19 U/L (ref 15–41)
Albumin: 2.6 g/dL — ABNORMAL LOW (ref 3.5–5.0)
Alkaline Phosphatase: 84 U/L (ref 38–126)
Anion gap: 8 (ref 5–15)
BUN: 17 mg/dL (ref 8–23)
CO2: 27 mmol/L (ref 22–32)
Calcium: 8.2 mg/dL — ABNORMAL LOW (ref 8.9–10.3)
Chloride: 105 mmol/L (ref 98–111)
Creatinine, Ser: 1.56 mg/dL — ABNORMAL HIGH (ref 0.61–1.24)
GFR, Estimated: 45 mL/min — ABNORMAL LOW (ref >60)
Glucose, Bld: 111 mg/dL — ABNORMAL HIGH (ref 70–99)
Potassium: 3.3 mmol/L — ABNORMAL LOW (ref 3.5–5.1)
Sodium: 140 mmol/L (ref 135–145)
Total Bilirubin: 0.5 mg/dL (ref 0.0–1.2)
Total Protein: 6 g/dL — ABNORMAL LOW (ref 6.5–8.1)

## 2023-04-21 LAB — MAGNESIUM: Magnesium: 1.9 mg/dL (ref 1.7–2.4)

## 2023-04-21 MED ORDER — FUROSEMIDE 10 MG/ML IJ SOLN
80.0000 mg | Freq: Two times a day (BID) | INTRAMUSCULAR | Status: DC
  Administered 2023-04-21 (×2): 80 mg via INTRAVENOUS
  Filled 2023-04-21 (×2): qty 8

## 2023-04-21 MED ORDER — POTASSIUM CHLORIDE CRYS ER 20 MEQ PO TBCR
40.0000 meq | EXTENDED_RELEASE_TABLET | ORAL | Status: AC
Start: 2023-04-21 — End: 2023-04-21
  Administered 2023-04-21 (×2): 40 meq via ORAL
  Filled 2023-04-21 (×2): qty 2

## 2023-04-21 MED ORDER — AMLODIPINE BESYLATE 5 MG PO TABS
5.0000 mg | ORAL_TABLET | Freq: Every day | ORAL | Status: DC
  Administered 2023-04-21 – 2023-04-24 (×4): 5 mg via ORAL
  Filled 2023-04-21 (×4): qty 1

## 2023-04-21 MED ORDER — HYDRALAZINE HCL 20 MG/ML IJ SOLN
10.0000 mg | INTRAMUSCULAR | Status: DC | PRN
  Administered 2023-04-21 (×3): 10 mg via INTRAVENOUS
  Filled 2023-04-21 (×3): qty 1

## 2023-04-21 MED ORDER — PERFLUTREN LIPID MICROSPHERE
1.0000 mL | INTRAVENOUS | Status: AC | PRN
  Administered 2023-04-21: 3 mL via INTRAVENOUS

## 2023-04-21 NOTE — Progress Notes (Signed)
 Administered 10 mg of hydralazine iv for bp = 188/66. Rechecked bp = 163/65.   Lawson Radar, RN

## 2023-04-21 NOTE — Progress Notes (Signed)
 [facility] night cross cover note:   Prn iv hydralazine added for SBP > 170 mmHg or DBP > 110 mmHg. Most recent BP noted to be 181/72. HR's in the 70's - 80's.    Newton Pigg, DO Hospitalist

## 2023-04-21 NOTE — TOC Initial Note (Addendum)
 Transition of Care [facility]) - Initial/Assessment Note    Patient Details  Name: Michael Bender MRN: [id number] Date of Birth: 22-Dec-1945  [facility] CM/SW Contact:    Leone Haven, RN Phone Number: 04/21/2023, 4:16 PM  Clinical Narrative:                 Patient gives this NCM permission to speak with either of his sons Ace Gins or [place].  From home alone, has PCP and insurance on file, states has no HH services in place at this time or DME at home.  States family member will transport them home at [employer] and family is support system, states [facility] [place].  Pta self ambulatory . Await PT eval.  Expected Discharge Plan: Home/Self Care Barriers to Discharge: Continued Medical Work up   Patient Goals and CMS Choice Patient states their goals for this hospitalization and ongoing recovery are:: return home   Choice offered to / list presented to : NA      Expected Discharge Plan and Services In-house Referral: NA   Post Acute Care Choice: NA Living arrangements for the past 2 months: Single Family Home                   DME Agency: NA       HH Arranged: NA          Prior Living Arrangements/Services Living arrangements for the past 2 months: Single Family Home Lives with:: Self Patient language and need for interpreter reviewed:: Yes Do you feel safe going back to the place where you live?: Yes      Need for Family Participation in Patient Care: Yes (Comment) Care giver support system in place?: Yes (comment)   Criminal Activity/Legal Involvement Pertinent to Current Situation/Hospitalization: No - Comment as needed  Activities of Daily Living   ADL Screening (condition at time of admission) Independently performs ADLs?: Yes (appropriate for developmental age) Is the patient deaf or have difficulty hearing?: No Does the patient have difficulty seeing, even when wearing glasses/contacts?: No Does the patient have  difficulty concentrating, remembering, or making decisions?: No  Permission Sought/Granted Permission sought to share information with : Case Manager Permission granted to share information with : Yes, Verbal Permission Granted  Share Information with NAME: Ace Gins and [name]     Permission granted to share info w Relationship: sons     Emotional Assessment Appearance:: Appears stated age Attitude/Demeanor/Rapport: Engaged Affect (typically observed): Appropriate Orientation: : Oriented to Self, Oriented to Place, Oriented to  Time, Oriented to Situation   Psych Involvement: No (comment)  Admission diagnosis:  Shortness of breath [R06.02] Acute on chronic diastolic CHF (congestive heart failure) (HCC) [I50.33] Congestive heart failure with left ventricular diastolic dysfunction, acute on chronic (HCC) [I50.33] Acute congestive heart failure, unspecified heart failure type (HCC) [I50.9] Patient Active Problem List   Diagnosis Date Noted   Acute on chronic diastolic CHF (congestive heart failure) (HCC) 04/20/2023   Congestive heart failure with left ventricular diastolic dysfunction, acute on chronic (HCC) 04/20/2023   Effusion of right knee 02/21/2023   Balance problems 02/21/2023   Frequent falls 02/17/2023   Nonintractable headache 02/17/2023   Memory loss 01/19/2023   Effusion of right knee joint 01/16/2023   Cervical discitis 11/10/2022   Hardware complicating wound infection (HCC) 11/10/2022   Staphylococcal arthritis of right knee (HCC) 11/08/2022   Neck pain 11/08/2022   Finger pain, left 11/08/2022   Septic arthritis of  wrist, left (HCC) 11/08/2022   MSSA bacteremia 11/07/2022   Septic infrapatellar bursitis of right knee 11/07/2022   Staphylococcal arthritis of left wrist (HCC) 11/07/2022   Infected blister of left index finger 11/07/2022   Syncope 11/05/2022   AKI (acute kidney injury) (HCC) 10/28/2022   Cachexia (HCC) 05/03/2022   Underweight on examination  05/03/2022   Chronic low back pain with bilateral sciatica 05/03/2022   Gynecomastia, male 04/05/2022   History of fusion of cervical spine 03/03/2022   History of lumbar fusion 03/03/2022   High risk medication use 03/03/2022   Intractable pain 03/03/2022   Chronic narcotic dependence (HCC) 12/14/2020   Atherosclerosis of aorta (HCC) 12/14/2020   Duodenal stenosis    HLD (hyperlipidemia) 07/18/2019   COPD (chronic obstructive pulmonary disease) (HCC) 07/18/2019   Iron deficiency anemia    History of CVA (cerebrovascular accident) 02/18/2019   Senile ecchymosis 01/21/2019   PAD (peripheral artery disease) (HCC) 12/11/2018   Insomnia 11/20/2018   Right sided temporal headache 05/22/2018   Esophageal dysphagia 07/15/2011   Bilateral leg pain 05/03/2010   DISC DISEASE, LUMBAR 01/01/2009   CONSTIPATION, CHRONIC 10/07/2008   GERD 04/04/2007   Pulmonary emphysema (HCC) 03/26/2007   PCP:  Lula Olszewski, MD Pharmacy:   [phone] [place] [place] - [address] [place] [address] Phone: [phone] Fax: [phone]  [facility] [address], [place] - [address] [place] [address] Phone: [phone] Fax: [phone]  [id number] [place], [place] - [address] [place] [address] Phone: [phone] Fax: [phone]     Social Drivers of Health (SDOH) Social History: SDOH Screenings   Food Insecurity: No Food Insecurity (04/20/2023)  Housing: Low Risk  (04/20/2023)  Transportation Needs: No Transportation Needs (04/20/2023)  Utilities: Not At Risk (04/20/2023)  Alcohol Screen: Low Risk  (07/14/2021)  Depression (PHQ2-9): Low Risk  (04/19/2023)  Financial Resource Strain: Low Risk  (10/13/2022)  Physical Activity: Insufficiently Active (10/13/2022)  Social Connections: Socially Isolated (04/20/2023)  Stress: No Stress Concern  Present (10/13/2022)  Tobacco Use: High Risk (04/20/2023)  Health Literacy: Inadequate Health Literacy (10/13/2022)   SDOH Interventions:     Readmission Risk Interventions    04/21/2023    4:14 PM 11/14/2022   11:34 AM  Readmission Risk Prevention Plan  Transportation Screening Complete Complete  PCP or Specialist Appt within 3-5 Days Complete Complete  HRI or Home Care Consult Complete Complete  Social Work Consult for Recovery Care Planning/Counseling Complete Complete  Palliative Care Screening Not Applicable Complete  Medication Review Oceanographer) Complete Complete

## 2023-04-21 NOTE — Progress Notes (Signed)
 Administered hydralazine 10 mg iv for bp = 178/71. Rechecked bp= 168/64.  Lawson Radar, RN

## 2023-04-21 NOTE — Progress Notes (Signed)
  Echocardiogram 2D Echocardiogram has been performed.  Michael Bender [name] 04/21/2023, 3:49 PM

## 2023-04-21 NOTE — Progress Notes (Signed)
 Provide pt Incentive spirometer and educated pt about its benefit, frequency, and correct way to do it. Pt demonstrated back correctly.   Lawson Radar, RN

## 2023-04-21 NOTE — Evaluation (Signed)
 Physical Therapy Evaluation Patient Details Name: Michael Bender MRN: [id number] DOB: August 04, 1945 Today's Date: 04/21/2023  History of Present Illness  Michael Bender is a 78 y.o. male admitted 04/20/23 with acute diastolic heart failure. Pt presented with worsening SOB, positive troponins, and elevated BNP. Chest x-ray showed vascular congestion and small B pleural effusions and bibasilar atelectasis. PMH of COPD, HTN, HLD, CVA, PAD, chronic pain, and constipation.   Clinical Impression  Pt admitted with above diagnosis. PTA, pt was modI with functional mobility using a walking stick and independent with ADLs. He resides alone in a one story house with a level entry. Pt currently with functional limitations due to the deficits listed below (see PT Problem List). He is below baseline functioning experiencing DOE 2/4 with activity. Pt tolerated weaning from 2L O2 to RA with SpO2 maintained >90% during session. He required CGA for all functional mobility this session. Educated pt on incentive spirometer use and recommended frequency. Pt will benefit from acute skilled PT to increase his independence and safety with mobility to allow d/c Home with HHPT.      If plan is discharge home, recommend the following: A little help with walking and/or transfers;A little help with bathing/dressing/bathroom;Assist for transportation;Assistance with cooking/housework   Can travel by Nurse, mental health (4 wheels)  Recommendations for Other Services       Functional Status Assessment Patient has had a recent decline in their functional status and demonstrates the ability to make significant improvements in function in a reasonable and predictable amount of time.     Precautions / Restrictions Precautions Precautions: Fall Recall of Precautions/Restrictions: Intact Restrictions Weight Bearing Restrictions Per Provider Order: No      Mobility  Bed  Mobility Overal bed mobility: Needs Assistance Bed Mobility: Supine to Sit     Supine to sit: HOB elevated, Used rails, Contact guard     General bed mobility comments: Pt sat up on R side of bed with increased time, brought BLE off EOB, CGA at trunk to reach upright and scoot fwd til feet supported.    Transfers Overall transfer level: Needs assistance Equipment used: None Transfers: Sit to/from Stand Sit to Stand: Contact guard assist           General transfer comment: Pt stood from lowest bed height by pushing up with BUE support from bed. Achieved full erect posture with CGA to power up. Good eccentric control with sitting.    Ambulation/Gait Ambulation/Gait assistance: Contact guard assist Gait Distance (Feet): 25 Feet Assistive device: None Gait Pattern/deviations: Step-through pattern, Decreased stride length, Wide base of support Gait velocity: reduced Gait velocity interpretation: <1.8 ft/sec, indicate of risk for recurrent falls   General Gait Details: Pt ambulated with a reciprocal gait pattern, WBOS, even weight shift, good foot clearence, and equal arm swing. He limited distance to within the room and c/o DOE 2/4.  Stairs            Wheelchair Mobility     Tilt Bed    Modified Rankin (Stroke Patients Only)       Balance Overall balance assessment: Mild deficits observed, not formally tested                                           Pertinent Vitals/Pain Pain Assessment Pain Assessment: No/denies pain  Home Living Family/patient expects to be discharged to:: Private residence Living Arrangements: Alone Available Help at Discharge: Family;Available PRN/intermittently Type of Home: House Home Access: Level entry       Home Layout: One level Home Equipment: Grab bars - tub/shower;Grab bars - toilet;Hand held shower head;Other (comment) (Walking Stick)      Prior Function Prior Level of Function :  Independent/Modified Independent;Driving             Mobility Comments: Ambulates using a walking stick. Denies falls in the last 71mo. ADLs Comments: Independent with ADLs/IADLs.     Extremity/Trunk Assessment   Upper Extremity Assessment Upper Extremity Assessment: Overall WFL for tasks assessed;Right hand dominant    Lower Extremity Assessment Lower Extremity Assessment: Overall WFL for tasks assessed    Cervical / Trunk Assessment Cervical / Trunk Assessment: Normal  Communication   Communication Communication: Impaired Factors Affecting Communication: Hearing impaired    Cognition Arousal: Alert Behavior During Therapy: WFL for tasks assessed/performed   PT - Cognitive impairments: No apparent impairments                       PT - Cognition Comments: Pt A,Ox4 Following commands: Intact       Cueing Cueing Techniques: Verbal cues     General Comments General comments (skin integrity, edema, etc.): VSS. Pt greeted on 2L O2, weaned to RA with SpO2 >90% throughout session. Reviewed proper use of incentive spirometer and instructed pt to complete 10 reps every hour.    Exercises Other Exercises Other Exercises: Incentive Spirometer x5 reps aiming for 1,562mL   Assessment/Plan    PT Assessment Patient needs continued PT services  PT Problem List Cardiopulmonary status limiting activity;Decreased activity tolerance;Decreased balance;Decreased mobility       PT Treatment Interventions DME instruction;Gait training;Functional mobility training;Therapeutic exercise;Therapeutic activities;Balance training;Neuromuscular re-education;Patient/family education    PT Goals (Current goals can be found in the Care Plan section)  Acute Rehab PT Goals Patient Stated Goal: Return Home without feeling SOB PT Goal Formulation: With patient Time For Goal Achievement: 05/05/23 Potential to Achieve Goals: Good    Frequency Min 2X/week     Co-evaluation                AM-PAC PT "6 Clicks" Mobility  Outcome Measure Help needed turning from your back to your side while in a flat bed without using bedrails?: A Little Help needed moving from lying on your back to sitting on the side of a flat bed without using bedrails?: A Little Help needed moving to and from a bed to a chair (including a wheelchair)?: A Little Help needed standing up from a chair using your arms (e.g., wheelchair or bedside chair)?: A Little Help needed to walk in hospital room?: A Little Help needed climbing 3-5 steps with a railing? : A Lot 6 Click Score: 17    End of Session Equipment Utilized During Treatment: Gait belt Activity Tolerance: Patient limited by fatigue Patient left: in chair;with call bell/phone within reach;with chair alarm set Nurse Communication: Mobility status;Other (comment) (Titrated to RA) PT Visit Diagnosis: Unsteadiness on feet (R26.81);Difficulty in walking, not elsewhere classified (R26.2);Other abnormalities of gait and mobility (R26.89)    Time: 1610-9604 PT Time Calculation (min) (ACUTE ONLY): 18 min   Charges:   PT Evaluation $PT Eval Moderate Complexity: 1 Mod   PT General Charges $$ ACUTE PT VISIT: 1 Visit         Cheri Guppy, PT, DPT  Acute Rehabilitation Services Office: [phone] Secure Chat Preferred  Richardson Chiquito 04/21/2023, 4:33 PM

## 2023-04-21 NOTE — Progress Notes (Signed)
 Rounding Note    Patient Name: Michael Bender Date of Encounter: 04/21/2023 [facility] Cardiologist:   Subjective   Breathing is still short   No CP   Inpatient Medications    Scheduled Meds:  amitriptyline  50 mg Oral QHS   enoxaparin (LOVENOX) injection  40 mg Subcutaneous Q24H   furosemide  40 mg Intravenous BID   nitroGLYCERIN  1 inch Topical Q6H   pantoprazole  40 mg Oral Daily   pneumococcal 20-valent conjugate vaccine  0.5 mL Intramuscular Tomorrow-1000   potassium chloride  40 mEq Oral Q4H   rosuvastatin  40 mg Oral QHS   senna-docusate  1 tablet Oral BID   Continuous Infusions:  PRN Meds: acetaminophen **OR** acetaminophen, albuterol, hydrALAZINE, ondansetron **OR** ondansetron (ZOFRAN) IV, Oxycodone HCl, polyethylene glycol   Vital Signs    Vitals:   04/21/23 0215 04/21/23 0255 04/21/23 0430 04/21/23 0723  BP: (!) 179/70 (!) 179/70 (!) 179/67 (!) 183/69  Pulse:   86 94  Resp:   (!) 21 18  Temp:   97.9 F (36.6 C) 98 F (36.7 C)  TempSrc:   Oral Oral  SpO2:   99% 96%  Weight:   65.3 kg   Height:        Intake/Output Summary (Last 24 hours) at 04/21/2023 0727 Last data filed at 04/21/2023 0441 Gross per 24 hour  Intake 675.87 ml  Output 1400 ml  Net -724.13 ml      04/21/2023    4:30 AM 04/20/2023    4:41 PM 04/20/2023    8:01 AM  Last 3 Weights  Weight (lbs) 143 lb 15.4 oz 148 lb 5.9 oz 153 lb 12.8 oz  Weight (kg) 65.3 kg 67.3 kg 69.763 kg      Telemetry    SR 80s  - Personally Reviewed  ECG     - Personally Reviewed  Physical Exam   GEN:  Thin 78 yo No acute distress.   Neck: No JVD Cardiac: RRR, no murmurs,  Respiratory:  Wheezes rhonchi bilaterally  GI: Soft, nontender, non-distended  MS: No edema;   Feet warm   Labs    High Sensitivity Troponin:   Recent Labs  Lab 04/20/23 0948 04/20/23 1120  TROPONINIHS 79* 72*     Chemistry Recent Labs  Lab 04/20/23 0948 04/21/23 0258  NA 137 140  K 2.8* 3.3*  CL  102 105  CO2 27 27  GLUCOSE 135* 111*  BUN 17 17  CREATININE 1.39* 1.56*  CALCIUM 8.1* 8.2*  MG 1.9 1.9  PROT  --  6.0*  ALBUMIN  --  2.6*  AST  --  19  ALT  --  7  ALKPHOS  --  84  BILITOT  --  0.5  GFRNONAA 52* 45*  ANIONGAP 8 8    Lipids No results for input(s): "CHOL", "TRIG", "HDL", "LABVLDL", "LDLCALC", "CHOLHDL" in the last 168 hours.  Hematology Recent Labs  Lab 04/20/23 0948 04/21/23 0258  WBC 6.6 5.8  RBC 2.89* 2.92*  HGB 8.7* 8.7*  HCT 26.3* 26.6*  MCV 91.0 91.1  MCH 30.1 29.8  MCHC 33.1 32.7  RDW 14.2 14.3  PLT 224 225   Thyroid No results for input(s): "TSH", "FREET4" in the last 168 hours.  BNP Recent Labs  Lab 04/20/23 0943  BNP 2,352.1*    DDimer No results for input(s): "DDIMER" in the last 168 hours.   Radiology    DG Chest [facility] 1 View Result Date:  04/20/2023 CLINICAL DATA:  Shortness of breath. EXAM: PORTABLE CHEST 1 VIEW COMPARISON:  Chest radiograph dated 11/05/2022. FINDINGS: Cardiomegaly with vascular congestion. Small bilateral flu shin is and bibasilar atelectasis, left greater than right. Pneumonia is not excluded. No pneumothorax. Atherosclerotic calcification of the aorta. No acute osseous pathology. IMPRESSION: 1. Cardiomegaly with vascular congestion. 2. Small bilateral pleural effusions and bibasilar atelectasis. Electronically Signed   By: Elgie Collard M.D.   On: 04/20/2023 10:32    Cardiac Studies   Echo ordered   Patient Profile      NKOSI CORTRIGHT is a 78 y.o. male with a hx of HTN and emphysema who is being seen 04/20/2023 for the evaluation of CHF at the request of Dr Hanley Ben.    Assessment & Plan    1  CHF   Pt presented yesterday with  sob for several days   Volume did appear to be elevated some    He has diuresed some with IV lasix   Today he does not appar to be significantly volume overloaded   JVP is normal  Echo is ordered   (previously normal)  I have written for lasix 80   Would give once then hold      2  Pulmonary   PT with dx of emphysema  Exam today sounds like more pulmonary causes for SOB with wheezing, use of accessory muscles    Consider empiric steroids along with nebs     2  HTN  BP is still elevated   I added NTG paste yesterday thinking diuresis may help     I will add amlodipine now   Follow   Await echo results   3  Elevated trop   Trivial  Flat  Most likely demand  No CP  4  Hx orthostasis/syncope   Occurred in setting of severe infection  5  renal     BUN/CR 17/1.56   HOld lasix after am dose      For questions or updates, please contact [facility] Please consult [email or web address] for contact info under        Signed, Dietrich Pates, MD  04/21/2023, 7:27 AM

## 2023-04-21 NOTE — Progress Notes (Signed)
 PROGRESS NOTE    Michael Bender  OAC:[id number] DOB: February 04, 1945 DOA: 04/20/2023 PCP: Lula Olszewski, MD   Brief Narrative:  78 y.o. male with medical history significant of 78 y.o. male with a history of COPD, hypertension, hyperlipidemia, CVA, PAD, chronic pain, constipation, MSSA bacteremia and septic left wrist arthritis requiring surgical intervention by orthopedics and discitis of C5-C6 on MRI of C-spine in 11/2022 treated with 6 weeks of IV antibiotics (end of treatment 12/18/2022) presented with worsening exertional shortness of breath.  On presentation, BNP was 2352.1 and high-sensitivity troponins were 79 and 72.  Chest x-ray showed possible pulmonary congestion.  He was started on IV Lasix.  Cardiology was consulted.  Assessment & Plan:   Probable acute diastolic heart failure Positive troponins: Possibly from demand ischemia Acute respiratory failure with hypoxia -Presented with worsening shortness of breath with positive troponins, elevated BNP and chest x-ray showed vascular congestion.  Patient does not take any diuretics at home. -Continue Lasix IV 40 mg twice a day.  Strict input and output.  Daily weights.  Fluid restriction.  Continue metoprolol.  Currently on nitroglycerin paste as well. -Cardiology following. -On 2 L oxygen via nasal cannula.  Wean off as able   Hypokalemia -Replace.  Repeat a.m. labs   Anemia of chronic disease -From chronic illnesses.  Hemoglobin stable.  Monitor intermittently   Current kidney disease stage IIIa -Creatinine has slightly trended upwards this morning.  Monitor..     Essential hypertension -Monitor blood pressure.  Continue Lasix and metoprolol.   COPD -Continue nebs as needed.   Hyperlipidemia -continue statin   Chronic pain with opiate dependence -Continue home pain medication regimen.  Outpatient follow-up with PCP/pain management   DVT prophylaxis: Lovenox Code Status: Full Family Communication: Son at bedside on  04/20/2023 Disposition Plan: Status is: Inpatient Remains inpatient appropriate because: Of severity of illness  Consultants: Cardiology  Procedures: None  Antimicrobials: None   Subjective: Patient seen and examined at bedside.  Feels slightly better but still short of breath with minimal exertion.  Denies any chest pain, fever or vomiting.  Objective: Vitals:   04/21/23 0027 04/21/23 0215 04/21/23 0255 04/21/23 0430  BP: (!) 181/72 (!) 179/70 (!) 179/70 (!) 179/67  Pulse: 82   86  Resp: 16   (!) 21  Temp: 98.2 F (36.8 C)   97.9 F (36.6 C)  TempSrc: Oral   Oral  SpO2: 95%   99%  Weight:    65.3 kg  Height:        Intake/Output Summary (Last 24 hours) at 04/21/2023 0656 Last data filed at 04/21/2023 0441 Gross per 24 hour  Intake 675.87 ml  Output 1400 ml  Net -724.13 ml   Filed Weights   04/20/23 1641 04/21/23 0430  Weight: 67.3 kg 65.3 kg    Examination:  General exam: Appears calm and comfortable.  Remains on 2 L oxygen via nasal cannula.  Thinly built. Respiratory system: Bilateral decreased breath sounds at bases with basilar crackles and some wheezing Cardiovascular system: S1 & S2 heard, Rate controlled Gastrointestinal system: Abdomen is nondistended, soft and nontender. Normal bowel sounds heard. Extremities: No cyanosis, clubbing; mild lower extremity edema present Central nervous system: Alert and oriented. No focal neurological deficits. Moving extremities Skin: No rashes, lesions or ulcers Psychiatry: Flat affect.  Not agitated.   Data Reviewed: I have personally reviewed following labs and imaging studies  CBC: Recent Labs  Lab 04/20/23 0948 04/21/23 0258  WBC 6.6 5.8  NEUTROABS  5.1  --   HGB 8.7* 8.7*  HCT 26.3* 26.6*  MCV 91.0 91.1  PLT 224 225   Basic Metabolic Panel: Recent Labs  Lab 04/20/23 0948 04/21/23 0258  NA 137 140  K 2.8* 3.3*  CL 102 105  CO2 27 27  GLUCOSE 135* 111*  BUN 17 17  CREATININE 1.39* 1.56*  CALCIUM  8.1* 8.2*  MG 1.9 1.9   GFR: Estimated Creatinine Clearance: 36 mL/min (A) (by C-G formula based on SCr of 1.56 mg/dL (H)). Liver Function Tests: Recent Labs  Lab 04/21/23 0258  AST 19  ALT 7  ALKPHOS 84  BILITOT 0.5  PROT 6.0*  ALBUMIN 2.6*   No results for input(s): "LIPASE", "AMYLASE" in the last 168 hours. No results for input(s): "AMMONIA" in the last 168 hours. Coagulation Profile: No results for input(s): "INR", "PROTIME" in the last 168 hours. Cardiac Enzymes: No results for input(s): "CKTOTAL", "CKMB", "CKMBINDEX", "TROPONINI" in the last 168 hours. BNP (last 3 results) No results for input(s): "PROBNP" in the last 8760 hours. HbA1C: No results for input(s): "HGBA1C" in the last 72 hours. CBG: No results for input(s): "GLUCAP" in the last 168 hours. Lipid Profile: No results for input(s): "CHOL", "HDL", "LDLCALC", "TRIG", "CHOLHDL", "LDLDIRECT" in the last 72 hours. Thyroid Function Tests: No results for input(s): "TSH", "T4TOTAL", "FREET4", "T3FREE", "THYROIDAB" in the last 72 hours. Anemia Panel: No results for input(s): "VITAMINB12", "FOLATE", "FERRITIN", "TIBC", "IRON", "RETICCTPCT" in the last 72 hours. Sepsis Labs: No results for input(s): "PROCALCITON", "LATICACIDVEN" in the last 168 hours.  Recent Results (from the past 240 hours)  Resp panel by RT-PCR (RSV, Flu A&B, Covid) Anterior Nasal Swab     Status: None   Collection Time: 04/20/23  9:48 AM   Specimen: Anterior Nasal Swab  Result Value Ref Range Status   SARS Coronavirus 2 by RT PCR NEGATIVE NEGATIVE Final    Comment: (NOTE) SARS-CoV-2 target nucleic acids are NOT DETECTED.  The SARS-CoV-2 RNA is generally detectable in upper respiratory specimens during the acute phase of infection. The lowest concentration of SARS-CoV-2 viral copies this assay can detect is 138 copies/mL. A negative result does not preclude SARS-Cov-2 infection and should not be used as the sole basis for treatment or other  patient management decisions. A negative result may occur with  improper specimen collection/handling, submission of specimen other than nasopharyngeal swab, presence of viral mutation(s) within the areas targeted by this assay, and inadequate number of viral copies(<138 copies/mL). A negative result must be combined with clinical observations, patient history, and epidemiological information. The expected result is Negative.  Fact Sheet for Patients:  BloggerCourse.com  Fact Sheet for Healthcare Providers:  SeriousBroker.it  This test is no t yet approved or cleared by the [place] FDA and  has been authorized for detection and/or diagnosis of SARS-CoV-2 by FDA under an Emergency Use Authorization (EUA). This EUA will remain  in effect (meaning this test can be used) for the duration of the COVID-19 declaration under Section 564(b)(1) of the Act, 21 U.S.C.section 360bbb-3(b)(1), unless the authorization is terminated  or revoked sooner.       Influenza A by PCR NEGATIVE NEGATIVE Final   Influenza B by PCR NEGATIVE NEGATIVE Final    Comment: (NOTE) The Xpert Xpress SARS-CoV-2/FLU/RSV plus assay is intended as an aid in the diagnosis of influenza from Nasopharyngeal swab specimens and should not be used as a sole basis for treatment. Nasal washings and aspirates are unacceptable for Xpert Xpress  SARS-CoV-2/FLU/RSV testing.  Fact Sheet for Patients: BloggerCourse.com  Fact Sheet for Healthcare Providers: SeriousBroker.it  This test is not yet approved or cleared by the [place] FDA and has been authorized for detection and/or diagnosis of SARS-CoV-2 by FDA under an Emergency Use Authorization (EUA). This EUA will remain in effect (meaning this test can be used) for the duration of the COVID-19 declaration under Section 564(b)(1) of the Act, 21 U.S.C. section  360bbb-3(b)(1), unless the authorization is terminated or revoked.     Resp Syncytial Virus by PCR NEGATIVE NEGATIVE Final    Comment: (NOTE) Fact Sheet for Patients: BloggerCourse.com  Fact Sheet for Healthcare Providers: SeriousBroker.it  This test is not yet approved or cleared by the [place] FDA and has been authorized for detection and/or diagnosis of SARS-CoV-2 by FDA under an Emergency Use Authorization (EUA). This EUA will remain in effect (meaning this test can be used) for the duration of the COVID-19 declaration under Section 564(b)(1) of the Act, 21 U.S.C. section 360bbb-3(b)(1), unless the authorization is terminated or revoked.  Performed at [facility], [address] [place], [place] [address]          Radiology Studies: [facility] Chest [facility] 1 View Result Date: 04/20/2023 CLINICAL DATA:  Shortness of breath. EXAM: PORTABLE CHEST 1 VIEW COMPARISON:  Chest radiograph dated 11/05/2022. FINDINGS: Cardiomegaly with vascular congestion. Small bilateral flu shin is and bibasilar atelectasis, left greater than right. Pneumonia is not excluded. No pneumothorax. Atherosclerotic calcification of the aorta. No acute osseous pathology. IMPRESSION: 1. Cardiomegaly with vascular congestion. 2. Small bilateral pleural effusions and bibasilar atelectasis. Electronically Signed   By: Elgie Collard M.D.   On: 04/20/2023 10:32        Scheduled Meds:  amitriptyline  50 mg Oral QHS   enoxaparin (LOVENOX) injection  40 mg Subcutaneous Q24H   furosemide  40 mg Intravenous BID   nitroGLYCERIN  1 inch Topical Q6H   pantoprazole  40 mg Oral Daily   pneumococcal 20-valent conjugate vaccine  0.5 mL Intramuscular Tomorrow-1000   rosuvastatin  40 mg Oral QHS   senna-docusate  1 tablet Oral BID   Continuous Infusions:        Glade Lloyd, MD [facility] 04/21/2023, 6:56 AM

## 2023-04-22 DIAGNOSIS — I951 Orthostatic hypotension: Secondary | ICD-10-CM | POA: Diagnosis not present

## 2023-04-22 DIAGNOSIS — I10 Essential (primary) hypertension: Secondary | ICD-10-CM | POA: Diagnosis not present

## 2023-04-22 DIAGNOSIS — I5033 Acute on chronic diastolic (congestive) heart failure: Secondary | ICD-10-CM | POA: Diagnosis not present

## 2023-04-22 DIAGNOSIS — R7989 Other specified abnormal findings of blood chemistry: Secondary | ICD-10-CM

## 2023-04-22 DIAGNOSIS — E876 Hypokalemia: Secondary | ICD-10-CM

## 2023-04-22 DIAGNOSIS — N179 Acute kidney failure, unspecified: Secondary | ICD-10-CM

## 2023-04-22 DIAGNOSIS — I5041 Acute combined systolic (congestive) and diastolic (congestive) heart failure: Secondary | ICD-10-CM | POA: Diagnosis not present

## 2023-04-22 HISTORY — DX: Acute combined systolic (congestive) and diastolic (congestive) heart failure: I50.41

## 2023-04-22 HISTORY — DX: Other specified abnormal findings of blood chemistry: R79.89

## 2023-04-22 HISTORY — DX: Hypomagnesemia: E83.42

## 2023-04-22 HISTORY — DX: Hypokalemia: E87.6

## 2023-04-22 LAB — BASIC METABOLIC PANEL
Anion gap: 11 (ref 5–15)
BUN: 19 mg/dL (ref 8–23)
CO2: 28 mmol/L (ref 22–32)
Calcium: 8.6 mg/dL — ABNORMAL LOW (ref 8.9–10.3)
Chloride: 101 mmol/L (ref 98–111)
Creatinine, Ser: 1.72 mg/dL — ABNORMAL HIGH (ref 0.61–1.24)
GFR, Estimated: 40 mL/min — ABNORMAL LOW (ref >60)
Glucose, Bld: 115 mg/dL — ABNORMAL HIGH (ref 70–99)
Potassium: 3.2 mmol/L — ABNORMAL LOW (ref 3.5–5.1)
Sodium: 140 mmol/L (ref 135–145)

## 2023-04-22 LAB — MAGNESIUM: Magnesium: 1.8 mg/dL (ref 1.7–2.4)

## 2023-04-22 MED ORDER — HYDRALAZINE HCL 10 MG PO TABS
10.0000 mg | ORAL_TABLET | Freq: Three times a day (TID) | ORAL | Status: DC
  Administered 2023-04-22 – 2023-04-23 (×4): 10 mg via ORAL
  Filled 2023-04-22 (×4): qty 1

## 2023-04-22 MED ORDER — POTASSIUM CHLORIDE CRYS ER 20 MEQ PO TBCR
40.0000 meq | EXTENDED_RELEASE_TABLET | ORAL | Status: AC
Start: 2023-04-22 — End: 2023-04-22
  Administered 2023-04-22 (×2): 40 meq via ORAL
  Filled 2023-04-22 (×2): qty 2

## 2023-04-22 MED ORDER — ISOSORBIDE MONONITRATE ER 30 MG PO TB24
30.0000 mg | ORAL_TABLET | Freq: Every day | ORAL | Status: DC
  Administered 2023-04-22 – 2023-04-23 (×2): 30 mg via ORAL
  Filled 2023-04-22 (×3): qty 1

## 2023-04-22 MED ORDER — MAGNESIUM SULFATE 2 GM/50ML IV SOLN
2.0000 g | Freq: Once | INTRAVENOUS | Status: AC
  Administered 2023-04-22: 2 g via INTRAVENOUS
  Filled 2023-04-22: qty 50

## 2023-04-22 NOTE — Progress Notes (Addendum)
 Rounding Note    Patient Name: Michael Bender Date of Encounter: 04/22/2023 [facility] Cardiologist:   Subjective   Denies any chest pain.  No SOB but has not been up to walk around yet  Inpatient Medications    Scheduled Meds:  amitriptyline  50 mg Oral QHS   amLODipine  5 mg Oral Daily   enoxaparin (LOVENOX) injection  40 mg Subcutaneous Q24H   furosemide  80 mg Intravenous BID   nitroGLYCERIN  1 inch Topical Q6H   pantoprazole  40 mg Oral Daily   pneumococcal 20-valent conjugate vaccine  0.5 mL Intramuscular Tomorrow-1000   potassium chloride  40 mEq Oral Q4H   rosuvastatin  40 mg Oral QHS   senna-docusate  1 tablet Oral BID   Continuous Infusions:  PRN Meds: acetaminophen **OR** acetaminophen, albuterol, hydrALAZINE, ondansetron **OR** ondansetron (ZOFRAN) IV, oxyCODONE, polyethylene glycol   Vital Signs    Vitals:   04/21/23 2021 04/22/23 0023 04/22/23 0502 04/22/23 0732  BP: (!) 160/65 (!) 162/72 (!) 176/70 (!) 162/70  Pulse:   90 88  Resp:  18 19   Temp:  98.1 F (36.7 C) (!) 97 F (36.1 C) 98.3 F (36.8 C)  TempSrc:  Oral Axillary Oral  SpO2:  98% 97% 99%  Weight:   60.7 kg   Height:        Intake/Output Summary (Last 24 hours) at 04/22/2023 0748 Last data filed at 04/22/2023 0509 Gross per 24 hour  Intake 1440 ml  Output 6150 ml  Net -4710 ml      04/22/2023    5:02 AM 04/21/2023    4:30 AM 04/20/2023    4:41 PM  Last 3 Weights  Weight (lbs) 133 lb 13.1 oz 143 lb 15.4 oz 148 lb 5.9 oz  Weight (kg) 60.7 kg 65.3 kg 67.3 kg      Telemetry    NSR  - Personally Reviewed  ECG    No new EKG to review - Personally Reviewed  Physical Exam   GEN: Well nourished, well developed in no acute distress HEENT: Normal NECK: No JVD; No carotid bruits LYMPHATICS: No lymphadenopathy CARDIAC:RRR, no murmurs, rubs, gallops RESPIRATORY:  Clear to auscultation without rales, wheezing or rhonchi  ABDOMEN: Soft, non-tender,  non-distended MUSCULOSKELETAL:  No edema; No deformity  SKIN: Warm and dry NEUROLOGIC:  Alert and oriented x 3 PSYCHIATRIC:  Normal affect  Labs    High Sensitivity Troponin:   Recent Labs  Lab 04/20/23 0948 04/20/23 1120  TROPONINIHS 79* 72*     Chemistry Recent Labs  Lab 04/20/23 0948 04/21/23 0258 04/22/23 0239  NA 137 140 140  K 2.8* 3.3* 3.2*  CL 102 105 101  CO2 27 27 28   GLUCOSE 135* 111* 115*  BUN 17 17 19   CREATININE 1.39* 1.56* 1.72*  CALCIUM 8.1* 8.2* 8.6*  MG 1.9 1.9 1.8  PROT  --  6.0*  --   ALBUMIN  --  2.6*  --   AST  --  19  --   ALT  --  7  --   ALKPHOS  --  84  --   BILITOT  --  0.5  --   GFRNONAA 52* 45* 40*  ANIONGAP 8 8 11     Lipids No results for input(s): "CHOL", "TRIG", "HDL", "LABVLDL", "LDLCALC", "CHOLHDL" in the last 168 hours.  Hematology Recent Labs  Lab 04/20/23 0948 04/21/23 0258  WBC 6.6 5.8  RBC 2.89* 2.92*  HGB 8.7* 8.7*  HCT 26.3* 26.6*  MCV 91.0 91.1  MCH 30.1 29.8  MCHC 33.1 32.7  RDW 14.2 14.3  PLT 224 225   Thyroid No results for input(s): "TSH", "FREET4" in the last 168 hours.  BNP Recent Labs  Lab 04/20/23 0943  BNP 2,352.1*    DDimer No results for input(s): "DDIMER" in the last 168 hours.   Radiology    ECHOCARDIOGRAM COMPLETE Result Date: 04/21/2023    ECHOCARDIOGRAM REPORT   Patient Name:   Michael Bender Date of Exam: 04/21/2023 Medical Rec #:  [id number]        Height:       71.0 in Accession #:    [id number]       Weight:       144.0 lb Date of Birth:  17-Sep-1945       BSA:          1.834 m Patient Age:    78 years         BP:           168/64 mmHg Patient Gender: M                HR:           94 bpm. Exam Location:  Inpatient Procedure: 2D Echo, Cardiac Doppler, Color Doppler and Intracardiac            Opacification Agent (Both Spectral and Color Flow Doppler were            utilized during procedure). Indications:    Dyspnea  History:        Patient has prior history of Echocardiogram examinations,  most                 recent 11/07/2022. COPD; Risk Factors:Hypertension, Dyslipidemia                 and Current Smoker.  Sonographer:    Karma Ganja Referring Phys: [id number] PAULA V ROSS  Sonographer Comments: Technically difficult study due to poor echo windows. Image acquisition challenging due to patient body habitus, Image acquisition challenging due to COPD and Image acquisition challenging due to respiratory motion. IMPRESSIONS  1. Left ventricular ejection fraction, by estimation, is 40 to 45%. The left ventricle has mildly decreased function. The left ventricle demonstrates global hypokinesis. There is mild left ventricular hypertrophy. Left ventricular diastolic parameters are consistent with Grade I diastolic dysfunction (impaired relaxation).  2. Right ventricular systolic function is normal. The right ventricular size is normal. There is mildly elevated pulmonary artery systolic pressure. The estimated right ventricular systolic pressure is 38.0 mmHg.  3. Moderate pleural effusion in both left and right lateral regions.  4. The mitral valve is abnormal. Trivial mitral valve regurgitation.  5. The aortic valve is tricuspid. Aortic valve regurgitation is trivial. Aortic valve sclerosis/calcification is present, without any evidence of aortic stenosis.  6. The inferior vena cava is normal in size with greater than 50% respiratory variability, suggesting right atrial pressure of 3 mmHg. Comparison(s): Changes from prior study are noted. 11/07/2022: LVEF 60-65%. FINDINGS  Left Ventricle: Left ventricular ejection fraction, by estimation, is 40 to 45%. The left ventricle has mildly decreased function. The left ventricle demonstrates global hypokinesis. Definity contrast agent was given IV to delineate the left ventricular  endocardial borders. The left ventricular internal cavity size was normal in size. There is mild left ventricular hypertrophy. Left ventricular diastolic parameters are consistent with Grade I  diastolic dysfunction (impaired relaxation). Indeterminate filling  pressures. Right Ventricle: The right ventricular size is normal. No increase in right ventricular wall thickness. Right ventricular systolic function is normal. There is mildly elevated pulmonary artery systolic pressure. The tricuspid regurgitant velocity is 2.96  m/s, and with an assumed right atrial pressure of 3 mmHg, the estimated right ventricular systolic pressure is 38.0 mmHg. Left Atrium: Left atrial size was normal in size. Right Atrium: Right atrial size was normal in size. Pericardium: There is no evidence of pericardial effusion. Mitral Valve: The mitral valve is abnormal. Mild mitral annular calcification. Trivial mitral valve regurgitation. MV peak gradient, 5.8 mmHg. The mean mitral valve gradient is 3.0 mmHg. Tricuspid Valve: The tricuspid valve is grossly normal. Tricuspid valve regurgitation is mild. Aortic Valve: The aortic valve is tricuspid. Aortic valve regurgitation is trivial. Aortic valve sclerosis/calcification is present, without any evidence of aortic stenosis. Aortic valve mean gradient measures 4.0 mmHg. Aortic valve peak gradient measures 7.2 mmHg. Aortic valve area, by VTI measures 2.32 cm. Pulmonic Valve: The pulmonic valve was normal in structure. Pulmonic valve regurgitation is not visualized. Aorta: The aortic root and ascending aorta are structurally normal, with no evidence of dilitation. Venous: The inferior vena cava is normal in size with greater than 50% respiratory variability, suggesting right atrial pressure of 3 mmHg. IAS/Shunts: No atrial level shunt detected by color flow Doppler. Additional Comments: There is a moderate pleural effusion in both left and right lateral regions.  LEFT VENTRICLE PLAX 2D LVIDd:         5.10 cm      Diastology LVIDs:         4.00 cm      LV e' medial:    7.72 cm/s LV PW:         1.20 cm      LV E/e' medial:  9.9 LV IVS:        1.10 cm      LV e' lateral:   5.98 cm/s LVOT  diam:     2.00 cm      LV E/e' lateral: 12.8 LV SV:         56 LV SV Index:   31 LVOT Area:     3.14 cm  LV Volumes (MOD) LV vol d, MOD A2C: 140.5 ml LV vol d, MOD A4C: 167.0 ml LV vol s, MOD A2C: 88.6 ml LV vol s, MOD A4C: 104.0 ml LV SV MOD A2C:     51.9 ml LV SV MOD A4C:     167.0 ml LV SV MOD BP:      57.7 ml RIGHT VENTRICLE             IVC RV Basal diam:  3.70 cm     IVC diam: 1.90 cm RV S prime:     14.60 cm/s TAPSE (M-mode): 2.8 cm LEFT ATRIUM             Index        RIGHT ATRIUM           Index LA diam:        4.70 cm 2.56 cm/m   RA Area:     14.30 cm LA Vol (A2C):   62.1 ml 33.87 ml/m  RA Volume:   37.50 ml  20.45 ml/m LA Vol (A4C):   40.8 ml 22.25 ml/m LA Biplane Vol: 50.7 ml 27.65 ml/m  AORTIC VALVE AV Area (Vmax):    2.15 cm AV Area (Vmean):   2.02 cm AV Area (VTI):  2.32 cm AV Vmax:           134.00 cm/s AV Vmean:          92.800 cm/s AV VTI:            0.242 m AV Peak Grad:      7.2 mmHg AV Mean Grad:      4.0 mmHg LVOT Vmax:         91.50 cm/s LVOT Vmean:        59.800 cm/s LVOT VTI:          0.179 m LVOT/AV VTI ratio: 0.74  AORTA Ao Root diam: 3.30 cm MITRAL VALVE                TRICUSPID VALVE MV Area (PHT): 4.96 cm     TR Peak grad:   35.0 mmHg MV Area VTI:   2.63 cm     TR Vmax:        296.00 cm/s MV Peak grad:  5.8 mmHg MV Mean grad:  3.0 mmHg     SHUNTS MV Vmax:       1.20 m/s     Systemic VTI:  0.18 m MV Vmean:      76.6 cm/s    Systemic Diam: 2.00 cm MV Decel Time: 153 msec MV E velocity: 76.60 cm/s MV A velocity: 111.00 cm/s MV E/A ratio:  0.69 Zoila Shutter MD Electronically signed by Zoila Shutter MD Signature Date/Time: 04/21/2023/5:07:36 PM    Final    DG Chest Port 1 View Result Date: 04/20/2023 CLINICAL DATA:  Shortness of breath. EXAM: PORTABLE CHEST 1 VIEW COMPARISON:  Chest radiograph dated 11/05/2022. FINDINGS: Cardiomegaly with vascular congestion. Small bilateral flu shin is and bibasilar atelectasis, left greater than right. Pneumonia is not excluded. No  pneumothorax. Atherosclerotic calcification of the aorta. No acute osseous pathology. IMPRESSION: 1. Cardiomegaly with vascular congestion. 2. Small bilateral pleural effusions and bibasilar atelectasis. Electronically Signed   By: Elgie Collard M.D.   On: 04/20/2023 10:32    Cardiac Studies   2D echo 04/21/2023  IMPRESSIONS    1. Left ventricular ejection fraction, by estimation, is 40 to 45%. The  left ventricle has mildly decreased function. The left ventricle  demonstrates global hypokinesis. There is mild left ventricular  hypertrophy. Left ventricular diastolic parameters  are consistent with Grade I diastolic dysfunction (impaired relaxation).   2. Right ventricular systolic function is normal. The right ventricular  size is normal. There is mildly elevated pulmonary artery systolic  pressure. The estimated right ventricular systolic pressure is 38.0 mmHg.   3. Moderate pleural effusion in both left and right lateral regions.   4. The mitral valve is abnormal. Trivial mitral valve regurgitation.   5. The aortic valve is tricuspid. Aortic valve regurgitation is trivial.  Aortic valve sclerosis/calcification is present, without any evidence of  aortic stenosis.   6. The inferior vena cava is normal in size with greater than 50%  respiratory variability, suggesting right atrial pressure of 3 mmHg.   Comparison(s): Changes from prior study are noted. 11/07/2022: LVEF 60-65%.   Patient Profile      Michael Bender is a 78 y.o. male with a hx of HTN and emphysema who is being seen 04/20/2023 for the evaluation of CHF at the request of Dr Hanley Ben.    Assessment & Plan    1  Acute combined systolic/diastolic CHF  -Pt presented with sob for several days    -2D echo with mildly  reduced LVF EF 40-45%, G1DD and normal RV (EF decline new from 11/2022 EF 60-65%) -moderate pleural effusions on Cxray -now on Lasix 80mg  IV BID -UOP 6.2L yesterday and net neg 5.4L since admit -SCr bumped  from 1.56 to 1.72 today -hold further diuretics -GDMT limited by AKI -hopefully will be able to add on Entresto 24-26mg  BID and spiro once renal function normalizes but in reviewing renal function over the past few months his SCr has been elevated at high as 2 -in the mean time will add Hydralazine 10mg  TID -change NTP to Imdur 30mg  daily  2 Emphysema -has hx of emphysema which likely is contributing to SOB -Bilateral pleural effusions    3  HTN   -BP remains elevated    -started on Amlodipine yesterday -starting Hydralazine 10mg  TID and Imdur 30mg  daily for GDMT  4  Elevated trop    -hsTrop minimally elevated with flat trend and likely demand ischemia -LVF has decreased some from prior echo so also need to consider underlying CAD -AKI precludes cath at this time  5  Hx orthostasis/syncope    -Occurred in setting of severe infection  6 AKI     -SCr bumped to 1.72 from 1.56 yesterday -hold diuretics -has had elevated SCr in the past  7  Hypokalemia -related to diuretics -replete to keep K+>4  8 Hypomagnesemia -Mag 1.8>>replete to keep >2  I spent 35 minutes caring for this patient today face to face, ordering and reviewing labs, reviewing records from 2D echo (facility and dates), seeing the patient, documenting in the record   For questions or updates, please contact [facility] Please consult [email or web address] for contact info under        Signed, Armanda Magic, MD  04/22/2023, 7:48 AM

## 2023-04-22 NOTE — Progress Notes (Signed)
 PROGRESS NOTE    Michael Bender  UEA:[id number] DOB: 19-Sep-1945 DOA: 04/20/2023 PCP: Lula Olszewski, MD   Brief Narrative:  78 y.o. male with medical history significant of 78 y.o. male with a history of COPD, hypertension, hyperlipidemia, CVA, PAD, chronic pain, constipation, MSSA bacteremia and septic left wrist arthritis requiring surgical intervention by orthopedics and discitis of C5-C6 on MRI of C-spine in 11/2022 treated with 6 weeks of IV antibiotics (end of treatment 12/18/2022) presented with worsening exertional shortness of breath.  On presentation, BNP was 2352.1 and high-sensitivity troponins were 79 and 72.  Chest x-ray showed possible pulmonary congestion.  He was started on IV Lasix.  Cardiology was consulted.  Assessment & Plan:   Probable acute combined heart failure Positive troponins: Possibly from demand ischemia Acute respiratory failure with hypoxia -Strict input and output.  Daily weights.  Fluid restriction.  Negative balance of 5434.1 cc since admission. -Cardiology following: Echo showed EF of 40 to 45% with grade 1 diastolic dysfunction.  Diuretics as per cardiology.  Continue metoprolol, nitroglycerin paste. -Cardiology following. -On 2 L oxygen via nasal cannula.  Wean off as able   Hypokalemia -Replace.  Repeat a.m. labs   Anemia of chronic disease -From chronic illnesses.  Hemoglobin stable.  Monitor intermittently   Current kidney disease stage IIIa -Creatinine has slightly trended upwards this morning as well.  Monitor.     Essential hypertension -Monitor blood pressure.  Continue amlodipine, Lasix and metoprolol.   COPD -Continue nebs as needed.   Hyperlipidemia -continue statin   Chronic pain with opiate dependence -Continue home pain medication regimen.  Outpatient follow-up with PCP/pain management   DVT prophylaxis: Lovenox Code Status: Full Family Communication: Son at bedside on 04/20/2023 Disposition Plan: Status is:  Inpatient Remains inpatient appropriate because: Of severity of illness  Consultants: Cardiology  Procedures: 2D echo  Antimicrobials: None   Subjective: Patient seen and examined at bedside.  Continues to feel short of breath with some exertion but feels better.  Denies worsening chest pain, vomiting, abdominal pain. Objective: Vitals:   04/21/23 2020 04/21/23 2021 04/22/23 0023 04/22/23 0502  BP: (!) 160/65 (!) 160/65 (!) 162/72 (!) 176/70  Pulse:    90  Resp: 18  18 19   Temp: 98.1 F (36.7 C)  98.1 F (36.7 C) (!) 97 F (36.1 C)  TempSrc: Oral  Oral Axillary  SpO2:   98% 97%  Weight:    60.7 kg  Height:        Intake/Output Summary (Last 24 hours) at 04/22/2023 0720 Last data filed at 04/22/2023 0509 Gross per 24 hour  Intake 1440 ml  Output 6150 ml  Net -4710 ml   Filed Weights   04/20/23 1641 04/21/23 0430 04/22/23 0502  Weight: 67.3 kg 65.3 kg 60.7 kg    Examination:  General: On 2 L oxygen via nasal cannula.  No distress.  Chronically ill and deconditioned looking. ENT/neck: No thyromegaly.  JVD is not elevated  respiratory: Decreased breath sounds at bases bilaterally with some crackles CVS: S1-S2 heard, rate controlled currently Abdominal: Soft, nontender, slightly distended; no organomegaly,  bowel sounds are heard Extremities: Trace lower extremity edema; no cyanosis  CNS: Awake and alert.  No focal neurologic deficit.  Moves extremities Lymph: No obvious lymphadenopathy Skin: No obvious ecchymosis/lesions  psych: Mostly flat affect.  Currently not agitated. musculoskeletal: No obvious joint swelling/deformity    Data Reviewed: I have personally reviewed following labs and imaging studies  CBC: Recent Labs  Lab  04/20/23 0948 04/21/23 0258  WBC 6.6 5.8  NEUTROABS 5.1  --   HGB 8.7* 8.7*  HCT 26.3* 26.6*  MCV 91.0 91.1  PLT 224 225   Basic Metabolic Panel: Recent Labs  Lab 04/20/23 0948 04/21/23 0258 04/22/23 0239  NA 137 140 140  K  2.8* 3.3* 3.2*  CL 102 105 101  CO2 27 27 28   GLUCOSE 135* 111* 115*  BUN 17 17 19   CREATININE 1.39* 1.56* 1.72*  CALCIUM 8.1* 8.2* 8.6*  MG 1.9 1.9 1.8   GFR: Estimated Creatinine Clearance: 30.4 mL/min (A) (by C-G formula based on SCr of 1.72 mg/dL (H)). Liver Function Tests: Recent Labs  Lab 04/21/23 0258  AST 19  ALT 7  ALKPHOS 84  BILITOT 0.5  PROT 6.0*  ALBUMIN 2.6*   No results for input(s): "LIPASE", "AMYLASE" in the last 168 hours. No results for input(s): "AMMONIA" in the last 168 hours. Coagulation Profile: No results for input(s): "INR", "PROTIME" in the last 168 hours. Cardiac Enzymes: No results for input(s): "CKTOTAL", "CKMB", "CKMBINDEX", "TROPONINI" in the last 168 hours. BNP (last 3 results) No results for input(s): "PROBNP" in the last 8760 hours. HbA1C: No results for input(s): "HGBA1C" in the last 72 hours. CBG: No results for input(s): "GLUCAP" in the last 168 hours. Lipid Profile: No results for input(s): "CHOL", "HDL", "LDLCALC", "TRIG", "CHOLHDL", "LDLDIRECT" in the last 72 hours. Thyroid Function Tests: No results for input(s): "TSH", "T4TOTAL", "FREET4", "T3FREE", "THYROIDAB" in the last 72 hours. Anemia Panel: No results for input(s): "VITAMINB12", "FOLATE", "FERRITIN", "TIBC", "IRON", "RETICCTPCT" in the last 72 hours. Sepsis Labs: No results for input(s): "PROCALCITON", "LATICACIDVEN" in the last 168 hours.  Recent Results (from the past 240 hours)  Resp panel by RT-PCR (RSV, Flu A&B, Covid) Anterior Nasal Swab     Status: None   Collection Time: 04/20/23  9:48 AM   Specimen: Anterior Nasal Swab  Result Value Ref Range Status   SARS Coronavirus 2 by RT PCR NEGATIVE NEGATIVE Final    Comment: (NOTE) SARS-CoV-2 target nucleic acids are NOT DETECTED.  The SARS-CoV-2 RNA is generally detectable in upper respiratory specimens during the acute phase of infection. The lowest concentration of SARS-CoV-2 viral copies this assay can detect  is 138 copies/mL. A negative result does not preclude SARS-Cov-2 infection and should not be used as the sole basis for treatment or other patient management decisions. A negative result may occur with  improper specimen collection/handling, submission of specimen other than nasopharyngeal swab, presence of viral mutation(s) within the areas targeted by this assay, and inadequate number of viral copies(<138 copies/mL). A negative result must be combined with clinical observations, patient history, and epidemiological information. The expected result is Negative.  Fact Sheet for Patients:  BloggerCourse.com  Fact Sheet for Healthcare Providers:  SeriousBroker.it  This test is no t yet approved or cleared by the [place] FDA and  has been authorized for detection and/or diagnosis of SARS-CoV-2 by FDA under an Emergency Use Authorization (EUA). This EUA will remain  in effect (meaning this test can be used) for the duration of the COVID-19 declaration under Section 564(b)(1) of the Act, 21 U.S.C.section 360bbb-3(b)(1), unless the authorization is terminated  or revoked sooner.       Influenza A by PCR NEGATIVE NEGATIVE Final   Influenza B by PCR NEGATIVE NEGATIVE Final    Comment: (NOTE) The Xpert Xpress SARS-CoV-2/FLU/RSV plus assay is intended as an aid in the diagnosis of influenza from Nasopharyngeal swab  specimens and should not be used as a sole basis for treatment. Nasal washings and aspirates are unacceptable for Xpert Xpress SARS-CoV-2/FLU/RSV testing.  Fact Sheet for Patients: BloggerCourse.com  Fact Sheet for Healthcare Providers: SeriousBroker.it  This test is not yet approved or cleared by the [place] FDA and has been authorized for detection and/or diagnosis of SARS-CoV-2 by FDA under an Emergency Use Authorization (EUA). This EUA will remain in effect  (meaning this test can be used) for the duration of the COVID-19 declaration under Section 564(b)(1) of the Act, 21 U.S.C. section 360bbb-3(b)(1), unless the authorization is terminated or revoked.     Resp Syncytial Virus by PCR NEGATIVE NEGATIVE Final    Comment: (NOTE) Fact Sheet for Patients: BloggerCourse.com  Fact Sheet for Healthcare Providers: SeriousBroker.it  This test is not yet approved or cleared by the [place] FDA and has been authorized for detection and/or diagnosis of SARS-CoV-2 by FDA under an Emergency Use Authorization (EUA). This EUA will remain in effect (meaning this test can be used) for the duration of the COVID-19 declaration under Section 564(b)(1) of the Act, 21 U.S.C. section 360bbb-3(b)(1), unless the authorization is terminated or revoked.  Performed at [facility], [address] [place], [place] [address]          Radiology Studies: ECHOCARDIOGRAM COMPLETE Result Date: 04/21/2023    ECHOCARDIOGRAM REPORT   Patient Name:   ANAIS KOENEN Date of Exam: 04/21/2023 Medical Rec #:  [id number]        Height:       71.0 in Accession #:    [id number]       Weight:       144.0 lb Date of Birth:  Dec 18, 1945       BSA:          1.834 m Patient Age:    77 years         BP:           168/64 mmHg Patient Gender: M                HR:           94 bpm. Exam Location:  Inpatient Procedure: 2D Echo, Cardiac Doppler, Color Doppler and Intracardiac            Opacification Agent (Both Spectral and Color Flow Doppler were            utilized during procedure). Indications:    Dyspnea  History:        Patient has prior history of Echocardiogram examinations, most                 recent 11/07/2022. COPD; Risk Factors:Hypertension, Dyslipidemia                 and Current Smoker.  Sonographer:    Karma Ganja Referring Phys: [id number] PAULA V ROSS  Sonographer Comments: Technically difficult study due to  poor echo windows. Image acquisition challenging due to patient body habitus, Image acquisition challenging due to COPD and Image acquisition challenging due to respiratory motion. IMPRESSIONS  1. Left ventricular ejection fraction, by estimation, is 40 to 45%. The left ventricle has mildly decreased function. The left ventricle demonstrates global hypokinesis. There is mild left ventricular hypertrophy. Left ventricular diastolic parameters are consistent with Grade I diastolic dysfunction (impaired relaxation).  2. Right ventricular systolic function is normal. The right ventricular size is normal. There is mildly elevated pulmonary artery systolic pressure. The estimated right  ventricular systolic pressure is 38.0 mmHg.  3. Moderate pleural effusion in both left and right lateral regions.  4. The mitral valve is abnormal. Trivial mitral valve regurgitation.  5. The aortic valve is tricuspid. Aortic valve regurgitation is trivial. Aortic valve sclerosis/calcification is present, without any evidence of aortic stenosis.  6. The inferior vena cava is normal in size with greater than 50% respiratory variability, suggesting right atrial pressure of 3 mmHg. Comparison(s): Changes from prior study are noted. 11/07/2022: LVEF 60-65%. FINDINGS  Left Ventricle: Left ventricular ejection fraction, by estimation, is 40 to 45%. The left ventricle has mildly decreased function. The left ventricle demonstrates global hypokinesis. Definity contrast agent was given IV to delineate the left ventricular  endocardial borders. The left ventricular internal cavity size was normal in size. There is mild left ventricular hypertrophy. Left ventricular diastolic parameters are consistent with Grade I diastolic dysfunction (impaired relaxation). Indeterminate filling pressures. Right Ventricle: The right ventricular size is normal. No increase in right ventricular wall thickness. Right ventricular systolic function is normal. There is  mildly elevated pulmonary artery systolic pressure. The tricuspid regurgitant velocity is 2.96  m/s, and with an assumed right atrial pressure of 3 mmHg, the estimated right ventricular systolic pressure is 38.0 mmHg. Left Atrium: Left atrial size was normal in size. Right Atrium: Right atrial size was normal in size. Pericardium: There is no evidence of pericardial effusion. Mitral Valve: The mitral valve is abnormal. Mild mitral annular calcification. Trivial mitral valve regurgitation. MV peak gradient, 5.8 mmHg. The mean mitral valve gradient is 3.0 mmHg. Tricuspid Valve: The tricuspid valve is grossly normal. Tricuspid valve regurgitation is mild. Aortic Valve: The aortic valve is tricuspid. Aortic valve regurgitation is trivial. Aortic valve sclerosis/calcification is present, without any evidence of aortic stenosis. Aortic valve mean gradient measures 4.0 mmHg. Aortic valve peak gradient measures 7.2 mmHg. Aortic valve area, by VTI measures 2.32 cm. Pulmonic Valve: The pulmonic valve was normal in structure. Pulmonic valve regurgitation is not visualized. Aorta: The aortic root and ascending aorta are structurally normal, with no evidence of dilitation. Venous: The inferior vena cava is normal in size with greater than 50% respiratory variability, suggesting right atrial pressure of 3 mmHg. IAS/Shunts: No atrial level shunt detected by color flow Doppler. Additional Comments: There is a moderate pleural effusion in both left and right lateral regions.  LEFT VENTRICLE PLAX 2D LVIDd:         5.10 cm      Diastology LVIDs:         4.00 cm      LV e' medial:    7.72 cm/s LV PW:         1.20 cm      LV E/e' medial:  9.9 LV IVS:        1.10 cm      LV e' lateral:   5.98 cm/s LVOT diam:     2.00 cm      LV E/e' lateral: 12.8 LV SV:         56 LV SV Index:   31 LVOT Area:     3.14 cm  LV Volumes (MOD) LV vol d, MOD A2C: 140.5 ml LV vol d, MOD A4C: 167.0 ml LV vol s, MOD A2C: 88.6 ml LV vol s, MOD A4C: 104.0 ml LV  SV MOD A2C:     51.9 ml LV SV MOD A4C:     167.0 ml LV SV MOD BP:      57.7  ml RIGHT VENTRICLE             IVC RV Basal diam:  3.70 cm     IVC diam: 1.90 cm RV S prime:     14.60 cm/s TAPSE (M-mode): 2.8 cm LEFT ATRIUM             Index        RIGHT ATRIUM           Index LA diam:        4.70 cm 2.56 cm/m   RA Area:     14.30 cm LA Vol (A2C):   62.1 ml 33.87 ml/m  RA Volume:   37.50 ml  20.45 ml/m LA Vol (A4C):   40.8 ml 22.25 ml/m LA Biplane Vol: 50.7 ml 27.65 ml/m  AORTIC VALVE AV Area (Vmax):    2.15 cm AV Area (Vmean):   2.02 cm AV Area (VTI):     2.32 cm AV Vmax:           134.00 cm/s AV Vmean:          92.800 cm/s AV VTI:            0.242 m AV Peak Grad:      7.2 mmHg AV Mean Grad:      4.0 mmHg LVOT Vmax:         91.50 cm/s LVOT Vmean:        59.800 cm/s LVOT VTI:          0.179 m LVOT/AV VTI ratio: 0.74  AORTA Ao Root diam: 3.30 cm MITRAL VALVE                TRICUSPID VALVE MV Area (PHT): 4.96 cm     TR Peak grad:   35.0 mmHg MV Area VTI:   2.63 cm     TR Vmax:        296.00 cm/s MV Peak grad:  5.8 mmHg MV Mean grad:  3.0 mmHg     SHUNTS MV Vmax:       1.20 m/s     Systemic VTI:  0.18 m MV Vmean:      76.6 cm/s    Systemic Diam: 2.00 cm MV Decel Time: 153 msec MV E velocity: 76.60 cm/s MV A velocity: 111.00 cm/s MV E/A ratio:  0.69 Zoila Shutter MD Electronically signed by Zoila Shutter MD Signature Date/Time: 04/21/2023/5:07:36 PM    Final    DG Chest Port 1 View Result Date: 04/20/2023 CLINICAL DATA:  Shortness of breath. EXAM: PORTABLE CHEST 1 VIEW COMPARISON:  Chest radiograph dated 11/05/2022. FINDINGS: Cardiomegaly with vascular congestion. Small bilateral flu shin is and bibasilar atelectasis, left greater than right. Pneumonia is not excluded. No pneumothorax. Atherosclerotic calcification of the aorta. No acute osseous pathology. IMPRESSION: 1. Cardiomegaly with vascular congestion. 2. Small bilateral pleural effusions and bibasilar atelectasis. Electronically Signed   By: Elgie Collard M.D.   On: 04/20/2023 10:32        Scheduled Meds:  amitriptyline  50 mg Oral QHS   amLODipine  5 mg Oral Daily   enoxaparin (LOVENOX) injection  40 mg Subcutaneous Q24H   furosemide  80 mg Intravenous BID   nitroGLYCERIN  1 inch Topical Q6H   pantoprazole  40 mg Oral Daily   pneumococcal 20-valent conjugate vaccine  0.5 mL Intramuscular Tomorrow-1000   rosuvastatin  40 mg Oral QHS   senna-docusate  1 tablet Oral BID   Continuous Infusions:  Glade Lloyd, MD [facility] 04/22/2023, 7:20 AM

## 2023-04-22 NOTE — Plan of Care (Signed)

## 2023-04-23 DIAGNOSIS — I10 Essential (primary) hypertension: Secondary | ICD-10-CM | POA: Diagnosis not present

## 2023-04-23 DIAGNOSIS — I5033 Acute on chronic diastolic (congestive) heart failure: Secondary | ICD-10-CM | POA: Diagnosis not present

## 2023-04-23 DIAGNOSIS — I951 Orthostatic hypotension: Secondary | ICD-10-CM | POA: Diagnosis not present

## 2023-04-23 DIAGNOSIS — R7989 Other specified abnormal findings of blood chemistry: Secondary | ICD-10-CM | POA: Diagnosis not present

## 2023-04-23 DIAGNOSIS — I5041 Acute combined systolic (congestive) and diastolic (congestive) heart failure: Secondary | ICD-10-CM | POA: Diagnosis not present

## 2023-04-23 LAB — BASIC METABOLIC PANEL
Anion gap: 8 (ref 5–15)
BUN: 25 mg/dL — ABNORMAL HIGH (ref 8–23)
CO2: 27 mmol/L (ref 22–32)
Calcium: 8.5 mg/dL — ABNORMAL LOW (ref 8.9–10.3)
Chloride: 104 mmol/L (ref 98–111)
Creatinine, Ser: 1.72 mg/dL — ABNORMAL HIGH (ref 0.61–1.24)
GFR, Estimated: 40 mL/min — ABNORMAL LOW (ref >60)
Glucose, Bld: 139 mg/dL — ABNORMAL HIGH (ref 70–99)
Potassium: 4.1 mmol/L (ref 3.5–5.1)
Sodium: 139 mmol/L (ref 135–145)

## 2023-04-23 LAB — MAGNESIUM: Magnesium: 1.9 mg/dL (ref 1.7–2.4)

## 2023-04-23 MED ORDER — HYDRALAZINE HCL 25 MG PO TABS
25.0000 mg | ORAL_TABLET | Freq: Three times a day (TID) | ORAL | Status: DC
  Administered 2023-04-23 – 2023-04-24 (×3): 25 mg via ORAL
  Filled 2023-04-23 (×3): qty 1

## 2023-04-23 NOTE — Plan of Care (Signed)

## 2023-04-23 NOTE — Progress Notes (Signed)
 PROGRESS NOTE    Michael Bender  WUJ:[id number] DOB: [birth year] DOA: 04/20/2023 PCP: Lula Olszewski, MD   Brief Narrative:  78 y.o. male with medical history significant of 78 y.o. male with a history of COPD, hypertension, hyperlipidemia, CVA, PAD, chronic pain, constipation, MSSA bacteremia and septic left wrist arthritis requiring surgical intervention by orthopedics and discitis of C5-C6 on MRI of C-spine in 11/2022 treated with 6 weeks of IV antibiotics (end of treatment 12/18/2022) presented with worsening exertional shortness of breath.  On presentation, BNP was 2352.1 and high-sensitivity troponins were 79 and 72.  Chest x-ray showed possible pulmonary congestion.  He was started on IV Lasix.  Cardiology was consulted.  Assessment & Plan:   Probable acute combined heart failure Positive troponins: Possibly from demand ischemia Acute respiratory failure with hypoxia -Strict input and output.  Daily weights.  Fluid restriction.  Negative balance of 5504.1 cc since admission. -Cardiology following: Echo showed EF of 40 to 45% with grade 1 diastolic dysfunction.  Diuretics as per cardiology.  Currently on hold due to slight AKI.  Continue Imdur and hydralazine as per cardiology. -Currently on room air   Hypokalemia -Improved   Anemia of chronic disease -From chronic illnesses.  Hemoglobin stable.  Monitor intermittently   Acute kidney injury on current kidney disease stage IIIa -Creatinine 1.72 again today.  Monitor.    Essential hypertension -Monitor blood pressure.  Continue amlodipine, Imdur and hydralazine.    COPD -Continue nebs as needed.   Hyperlipidemia -continue statin   Chronic pain with opiate dependence -Continue home pain medication regimen.  Outpatient follow-up with PCP/pain management   DVT prophylaxis: Lovenox Code Status: Full Family Communication: Son at bedside on 04/20/2023 Disposition Plan: Status is: Inpatient Remains inpatient appropriate  because: Of severity of illness  Consultants: Cardiology  Procedures: 2D echo  Antimicrobials: None   Subjective: Patient seen and examined at bedside.  Breathing is improving but still short of breath with mild exertion.  Denies any chest pain, fever or vomiting. Objective: Vitals:   04/23/23 0119 04/23/23 0521 04/23/23 0525 04/23/23 0712  BP: (!) 151/62  (!) 169/68 (!) 168/68  Pulse: 83  83 81  Resp: 16  18 18   Temp: 98.1 F (36.7 C)  97.8 F (36.6 C) 98 F (36.7 C)  TempSrc: Oral  Oral Oral  SpO2: 97%  99% 97%  Weight:  61.5 kg    Height:        Intake/Output Summary (Last 24 hours) at 04/23/2023 0729 Last data filed at 04/23/2023 0525 Gross per 24 hour  Intake 1200 ml  Output 1510 ml  Net -310 ml   Filed Weights   04/21/23 0430 04/22/23 0502 04/23/23 0521  Weight: 65.3 kg 60.7 kg 61.5 kg    Examination:  General: No acute distress.  On room air.  Chronically ill and deconditioned looking. ENT/neck: No palpable neck masses or elevated JVD noted respiratory: Bilateral decreased breath sounds at bases with scattered crackles  CVS: Rate mostly controlled; S1 and S2 are heard Abdominal: Soft, nontender, distended mildly; no organomegaly,  bowel sounds are heard normally Extremities: No clubbing; mild lower extremity edema present CNS: Alert and oriented.  No focal neurologic deficit.  Able to move extremities Lymph: No obvious palpable lymphadenopathy Skin: No obvious petechiae/rashes psych: Showing no signs of agitation.  Flat affect mostly. musculoskeletal: No obvious joint tenderness/erythema   Data Reviewed: I have personally reviewed following labs and imaging studies  CBC: Recent Labs  Lab 04/20/23 0948  04/21/23 0258  WBC 6.6 5.8  NEUTROABS 5.1  --   HGB 8.7* 8.7*  HCT 26.3* 26.6*  MCV 91.0 91.1  PLT 224 225   Basic Metabolic Panel: Recent Labs  Lab 04/20/23 0948 04/21/23 0258 04/22/23 0239 04/23/23 0237  NA 137 140 140 139  K 2.8* 3.3*  3.2* 4.1  CL 102 105 101 104  CO2 27 27 28 27   GLUCOSE 135* 111* 115* 139*  BUN 17 17 19  25*  CREATININE 1.39* 1.56* 1.72* 1.72*  CALCIUM 8.1* 8.2* 8.6* 8.5*  MG 1.9 1.9 1.8 1.9   GFR: Estimated Creatinine Clearance: 30.8 mL/min (A) (by C-G formula based on SCr of 1.72 mg/dL (H)). Liver Function Tests: Recent Labs  Lab 04/21/23 0258  AST 19  ALT 7  ALKPHOS 84  BILITOT 0.5  PROT 6.0*  ALBUMIN 2.6*   No results for input(s): "LIPASE", "AMYLASE" in the last 168 hours. No results for input(s): "AMMONIA" in the last 168 hours. Coagulation Profile: No results for input(s): "INR", "PROTIME" in the last 168 hours. Cardiac Enzymes: No results for input(s): "CKTOTAL", "CKMB", "CKMBINDEX", "TROPONINI" in the last 168 hours. BNP (last 3 results) No results for input(s): "PROBNP" in the last 8760 hours. HbA1C: No results for input(s): "HGBA1C" in the last 72 hours. CBG: No results for input(s): "GLUCAP" in the last 168 hours. Lipid Profile: No results for input(s): "CHOL", "HDL", "LDLCALC", "TRIG", "CHOLHDL", "LDLDIRECT" in the last 72 hours. Thyroid Function Tests: No results for input(s): "TSH", "T4TOTAL", "FREET4", "T3FREE", "THYROIDAB" in the last 72 hours. Anemia Panel: No results for input(s): "VITAMINB12", "FOLATE", "FERRITIN", "TIBC", "IRON", "RETICCTPCT" in the last 72 hours. Sepsis Labs: No results for input(s): "PROCALCITON", "LATICACIDVEN" in the last 168 hours.  Recent Results (from the past 240 hours)  Resp panel by RT-PCR (RSV, Flu A&B, Covid) Anterior Nasal Swab     Status: None   Collection Time: 04/20/23  9:48 AM   Specimen: Anterior Nasal Swab  Result Value Ref Range Status   SARS Coronavirus 2 by RT PCR NEGATIVE NEGATIVE Final    Comment: (NOTE) SARS-CoV-2 target nucleic acids are NOT DETECTED.  The SARS-CoV-2 RNA is generally detectable in upper respiratory specimens during the acute phase of infection. The lowest concentration of SARS-CoV-2 viral copies  this assay can detect is 138 copies/mL. A negative result does not preclude SARS-Cov-2 infection and should not be used as the sole basis for treatment or other patient management decisions. A negative result may occur with  improper specimen collection/handling, submission of specimen other than nasopharyngeal swab, presence of viral mutation(s) within the areas targeted by this assay, and inadequate number of viral copies(<138 copies/mL). A negative result must be combined with clinical observations, patient history, and epidemiological information. The expected result is Negative.  Fact Sheet for Patients:  BloggerCourse.com  Fact Sheet for Healthcare Providers:  SeriousBroker.it  This test is no t yet approved or cleared by the [place] FDA and  has been authorized for detection and/or diagnosis of SARS-CoV-2 by FDA under an Emergency Use Authorization (EUA). This EUA will remain  in effect (meaning this test can be used) for the duration of the COVID-19 declaration under Section 564(b)(1) of the Act, 21 U.S.C.section 360bbb-3(b)(1), unless the authorization is terminated  or revoked sooner.       Influenza A by PCR NEGATIVE NEGATIVE Final   Influenza B by PCR NEGATIVE NEGATIVE Final    Comment: (NOTE) The Xpert Xpress SARS-CoV-2/FLU/RSV plus assay is intended as an  aid in the diagnosis of influenza from Nasopharyngeal swab specimens and should not be used as a sole basis for treatment. Nasal washings and aspirates are unacceptable for Xpert Xpress SARS-CoV-2/FLU/RSV testing.  Fact Sheet for Patients: BloggerCourse.com  Fact Sheet for Healthcare Providers: SeriousBroker.it  This test is not yet approved or cleared by the [place] FDA and has been authorized for detection and/or diagnosis of SARS-CoV-2 by FDA under an Emergency Use Authorization (EUA). This EUA  will remain in effect (meaning this test can be used) for the duration of the COVID-19 declaration under Section 564(b)(1) of the Act, 21 U.S.C. section 360bbb-3(b)(1), unless the authorization is terminated or revoked.     Resp Syncytial Virus by PCR NEGATIVE NEGATIVE Final    Comment: (NOTE) Fact Sheet for Patients: BloggerCourse.com  Fact Sheet for Healthcare Providers: SeriousBroker.it  This test is not yet approved or cleared by the [place] FDA and has been authorized for detection and/or diagnosis of SARS-CoV-2 by FDA under an Emergency Use Authorization (EUA). This EUA will remain in effect (meaning this test can be used) for the duration of the COVID-19 declaration under Section 564(b)(1) of the Act, 21 U.S.C. section 360bbb-3(b)(1), unless the authorization is terminated or revoked.  Performed at [facility], [address] [place], [place] [address]          Radiology Studies: ECHOCARDIOGRAM COMPLETE Result Date: 04/21/2023    ECHOCARDIOGRAM REPORT   Patient Name:   Michael Bender Date of Exam: 04/21/2023 Medical Rec #:  [id number]        Height:       71.0 in Accession #:    [id number]       Weight:       144.0 lb Date of Birth:  1945/02/03       BSA:          1.834 m Patient Age:    77 years         BP:           168/64 mmHg Patient Gender: M                HR:           94 bpm. Exam Location:  Inpatient Procedure: 2D Echo, Cardiac Doppler, Color Doppler and Intracardiac            Opacification Agent (Both Spectral and Color Flow Doppler were            utilized during procedure). Indications:    Dyspnea  History:        Patient has prior history of Echocardiogram examinations, most                 recent 11/07/2022. COPD; Risk Factors:Hypertension, Dyslipidemia                 and Current Smoker.  Sonographer:    Karma Ganja Referring Phys: [id number] PAULA V ROSS  Sonographer Comments: Technically  difficult study due to poor echo windows. Image acquisition challenging due to patient body habitus, Image acquisition challenging due to COPD and Image acquisition challenging due to respiratory motion. IMPRESSIONS  1. Left ventricular ejection fraction, by estimation, is 40 to 45%. The left ventricle has mildly decreased function. The left ventricle demonstrates global hypokinesis. There is mild left ventricular hypertrophy. Left ventricular diastolic parameters are consistent with Grade I diastolic dysfunction (impaired relaxation).  2. Right ventricular systolic function is normal. The right ventricular size is normal. There is  mildly elevated pulmonary artery systolic pressure. The estimated right ventricular systolic pressure is 38.0 mmHg.  3. Moderate pleural effusion in both left and right lateral regions.  4. The mitral valve is abnormal. Trivial mitral valve regurgitation.  5. The aortic valve is tricuspid. Aortic valve regurgitation is trivial. Aortic valve sclerosis/calcification is present, without any evidence of aortic stenosis.  6. The inferior vena cava is normal in size with greater than 50% respiratory variability, suggesting right atrial pressure of 3 mmHg. Comparison(s): Changes from prior study are noted. 11/07/2022: LVEF 60-65%. FINDINGS  Left Ventricle: Left ventricular ejection fraction, by estimation, is 40 to 45%. The left ventricle has mildly decreased function. The left ventricle demonstrates global hypokinesis. Definity contrast agent was given IV to delineate the left ventricular  endocardial borders. The left ventricular internal cavity size was normal in size. There is mild left ventricular hypertrophy. Left ventricular diastolic parameters are consistent with Grade I diastolic dysfunction (impaired relaxation). Indeterminate filling pressures. Right Ventricle: The right ventricular size is normal. No increase in right ventricular wall thickness. Right ventricular systolic function is  normal. There is mildly elevated pulmonary artery systolic pressure. The tricuspid regurgitant velocity is 2.96  m/s, and with an assumed right atrial pressure of 3 mmHg, the estimated right ventricular systolic pressure is 38.0 mmHg. Left Atrium: Left atrial size was normal in size. Right Atrium: Right atrial size was normal in size. Pericardium: There is no evidence of pericardial effusion. Mitral Valve: The mitral valve is abnormal. Mild mitral annular calcification. Trivial mitral valve regurgitation. MV peak gradient, 5.8 mmHg. The mean mitral valve gradient is 3.0 mmHg. Tricuspid Valve: The tricuspid valve is grossly normal. Tricuspid valve regurgitation is mild. Aortic Valve: The aortic valve is tricuspid. Aortic valve regurgitation is trivial. Aortic valve sclerosis/calcification is present, without any evidence of aortic stenosis. Aortic valve mean gradient measures 4.0 mmHg. Aortic valve peak gradient measures 7.2 mmHg. Aortic valve area, by VTI measures 2.32 cm. Pulmonic Valve: The pulmonic valve was normal in structure. Pulmonic valve regurgitation is not visualized. Aorta: The aortic root and ascending aorta are structurally normal, with no evidence of dilitation. Venous: The inferior vena cava is normal in size with greater than 50% respiratory variability, suggesting right atrial pressure of 3 mmHg. IAS/Shunts: No atrial level shunt detected by color flow Doppler. Additional Comments: There is a moderate pleural effusion in both left and right lateral regions.  LEFT VENTRICLE PLAX 2D LVIDd:         5.10 cm      Diastology LVIDs:         4.00 cm      LV e' medial:    7.72 cm/s LV PW:         1.20 cm      LV E/e' medial:  9.9 LV IVS:        1.10 cm      LV e' lateral:   5.98 cm/s LVOT diam:     2.00 cm      LV E/e' lateral: 12.8 LV SV:         56 LV SV Index:   31 LVOT Area:     3.14 cm  LV Volumes (MOD) LV vol d, MOD A2C: 140.5 ml LV vol d, MOD A4C: 167.0 ml LV vol s, MOD A2C: 88.6 ml LV vol s, MOD  A4C: 104.0 ml LV SV MOD A2C:     51.9 ml LV SV MOD A4C:     167.0 ml LV  SV MOD BP:      57.7 ml RIGHT VENTRICLE             IVC RV Basal diam:  3.70 cm     IVC diam: 1.90 cm RV S prime:     14.60 cm/s TAPSE (M-mode): 2.8 cm LEFT ATRIUM             Index        RIGHT ATRIUM           Index LA diam:        4.70 cm 2.56 cm/m   RA Area:     14.30 cm LA Vol (A2C):   62.1 ml 33.87 ml/m  RA Volume:   37.50 ml  20.45 ml/m LA Vol (A4C):   40.8 ml 22.25 ml/m LA Biplane Vol: 50.7 ml 27.65 ml/m  AORTIC VALVE AV Area (Vmax):    2.15 cm AV Area (Vmean):   2.02 cm AV Area (VTI):     2.32 cm AV Vmax:           134.00 cm/s AV Vmean:          92.800 cm/s AV VTI:            0.242 m AV Peak Grad:      7.2 mmHg AV Mean Grad:      4.0 mmHg LVOT Vmax:         91.50 cm/s LVOT Vmean:        59.800 cm/s LVOT VTI:          0.179 m LVOT/AV VTI ratio: 0.74  AORTA Ao Root diam: 3.30 cm MITRAL VALVE                TRICUSPID VALVE MV Area (PHT): 4.96 cm     TR Peak grad:   35.0 mmHg MV Area VTI:   2.63 cm     TR Vmax:        296.00 cm/s MV Peak grad:  5.8 mmHg MV Mean grad:  3.0 mmHg     SHUNTS MV Vmax:       1.20 m/s     Systemic VTI:  0.18 m MV Vmean:      76.6 cm/s    Systemic Diam: 2.00 cm MV Decel Time: 153 msec MV E velocity: 76.60 cm/s MV A velocity: 111.00 cm/s MV E/A ratio:  0.69 Zoila Shutter MD Electronically signed by Zoila Shutter MD Signature Date/Time: 04/21/2023/5:07:36 PM    Final         Scheduled Meds:  amitriptyline  50 mg Oral QHS   amLODipine  5 mg Oral Daily   enoxaparin (LOVENOX) injection  40 mg Subcutaneous Q24H   hydrALAZINE  10 mg Oral Q8H   isosorbide mononitrate  30 mg Oral Daily   pantoprazole  40 mg Oral Daily   rosuvastatin  40 mg Oral QHS   senna-docusate  1 tablet Oral BID   Continuous Infusions:        Glade Lloyd, MD [facility] 04/23/2023, 7:29 AM

## 2023-04-23 NOTE — Progress Notes (Signed)
 Rounding Note    Patient Name: Michael Bender Date of Encounter: 04/23/2023 [facility] ([facility] Cardiologist:   Subjective   No complaints today. No CP or SOB. Diuretics held yesterday due to bump in SCr  Inpatient Medications    Scheduled Meds:  amitriptyline  50 mg Oral QHS   amLODipine  5 mg Oral Daily   enoxaparin (LOVENOX) injection  40 mg Subcutaneous Q24H   hydrALAZINE  10 mg Oral Q8H   isosorbide mononitrate  30 mg Oral Daily   pantoprazole  40 mg Oral Daily   rosuvastatin  40 mg Oral QHS   senna-docusate  1 tablet Oral BID   Continuous Infusions:  PRN Meds: acetaminophen **OR** acetaminophen, albuterol, hydrALAZINE, ondansetron **OR** ondansetron (ZOFRAN) IV, oxyCODONE, polyethylene glycol   Vital Signs    Vitals:   04/23/23 0119 04/23/23 0521 04/23/23 0525 04/23/23 0712  BP: (!) 151/62  (!) 169/68 (!) 168/68  Pulse: 83  83 81  Resp: 16  18 18   Temp: 98.1 F (36.7 C)  97.8 F (36.6 C) 98 F (36.7 C)  TempSrc: Oral  Oral Oral  SpO2: 97%  99% 97%  Weight:  61.5 kg    Height:        Intake/Output Summary (Last 24 hours) at 04/23/2023 0746 Last data filed at 04/23/2023 0525 Gross per 24 hour  Intake 1200 ml  Output 1510 ml  Net -310 ml      04/23/2023    5:21 AM 04/22/2023    5:02 AM 04/21/2023    4:30 AM  Last 3 Weights  Weight (lbs) 135 lb 8 oz 133 lb 13.1 oz 143 lb 15.4 oz  Weight (kg) 61.462 kg 60.7 kg 65.3 kg      Telemetry    NSR  - Personally Reviewed  ECG    No new EKG to review - Personally Reviewed  Physical Exam   GEN: Well nourished, well developed in no acute distress HEENT: Normal NECK: No JVD; No carotid bruits LYMPHATICS: No lymphadenopathy CARDIAC:RRR, no murmurs, rubs, gallops RESPIRATORY:  Clear to auscultation without rales, wheezing or rhonchi  ABDOMEN: Soft, non-tender, non-distended MUSCULOSKELETAL:  No edema; No deformity  SKIN: Warm and dry NEUROLOGIC:  Alert and oriented x 3 PSYCHIATRIC:  Normal affect   Labs    High Sensitivity Troponin:   Recent Labs  Lab 04/20/23 0948 04/20/23 1120  TROPONINIHS 79* 72*     Chemistry Recent Labs  Lab 04/21/23 0258 04/22/23 0239 04/23/23 0237  NA 140 140 139  K 3.3* 3.2* 4.1  CL 105 101 104  CO2 27 28 27   GLUCOSE 111* 115* 139*  BUN 17 19 25*  CREATININE 1.56* 1.72* 1.72*  CALCIUM 8.2* 8.6* 8.5*  MG 1.9 1.8 1.9  PROT 6.0*  --   --   ALBUMIN 2.6*  --   --   AST 19  --   --   ALT 7  --   --   ALKPHOS 84  --   --   BILITOT 0.5  --   --   GFRNONAA 45* 40* 40*  ANIONGAP 8 11 8     Lipids No results for input(s): "CHOL", "TRIG", "HDL", "LABVLDL", "LDLCALC", "CHOLHDL" in the last 168 hours.  Hematology Recent Labs  Lab 04/20/23 0948 04/21/23 0258  WBC 6.6 5.8  RBC 2.89* 2.92*  HGB 8.7* 8.7*  HCT 26.3* 26.6*  MCV 91.0 91.1  MCH 30.1 29.8  MCHC 33.1 32.7  RDW 14.2 14.3  PLT  224 225   Thyroid No results for input(s): "TSH", "FREET4" in the last 168 hours.  BNP Recent Labs  Lab 04/20/23 0943  BNP 2,352.1*    DDimer No results for input(s): "DDIMER" in the last 168 hours.   Radiology    ECHOCARDIOGRAM COMPLETE Result Date: 04/21/2023    ECHOCARDIOGRAM REPORT   Patient Name:   Michael Bender Date of Exam: 04/21/2023 Medical Rec #:  [id number]        Height:       71.0 in Accession #:    [id number]       Weight:       144.0 lb Date of Birth:  1945/08/02       BSA:          1.834 m Patient Age:    78 years         BP:           168/64 mmHg Patient Gender: M                HR:           94 bpm. Exam Location:  Inpatient Procedure: 2D Echo, Cardiac Doppler, Color Doppler and Intracardiac            Opacification Agent (Both Spectral and Color Flow Doppler were            utilized during procedure). Indications:    Dyspnea  History:        Patient has prior history of Echocardiogram examinations, most                 recent 11/07/2022. COPD; Risk Factors:Hypertension, Dyslipidemia                 and Current Smoker.  Sonographer:     Karma Ganja Referring Phys: [id number] PAULA V ROSS  Sonographer Comments: Technically difficult study due to poor echo windows. Image acquisition challenging due to patient body habitus, Image acquisition challenging due to COPD and Image acquisition challenging due to respiratory motion. IMPRESSIONS  1. Left ventricular ejection fraction, by estimation, is 40 to 45%. The left ventricle has mildly decreased function. The left ventricle demonstrates global hypokinesis. There is mild left ventricular hypertrophy. Left ventricular diastolic parameters are consistent with Grade I diastolic dysfunction (impaired relaxation).  2. Right ventricular systolic function is normal. The right ventricular size is normal. There is mildly elevated pulmonary artery systolic pressure. The estimated right ventricular systolic pressure is 38.0 mmHg.  3. Moderate pleural effusion in both left and right lateral regions.  4. The mitral valve is abnormal. Trivial mitral valve regurgitation.  5. The aortic valve is tricuspid. Aortic valve regurgitation is trivial. Aortic valve sclerosis/calcification is present, without any evidence of aortic stenosis.  6. The inferior vena cava is normal in size with greater than 50% respiratory variability, suggesting right atrial pressure of 3 mmHg. Comparison(s): Changes from prior study are noted. 11/07/2022: LVEF 60-65%. FINDINGS  Left Ventricle: Left ventricular ejection fraction, by estimation, is 40 to 45%. The left ventricle has mildly decreased function. The left ventricle demonstrates global hypokinesis. Definity contrast agent was given IV to delineate the left ventricular  endocardial borders. The left ventricular internal cavity size was normal in size. There is mild left ventricular hypertrophy. Left ventricular diastolic parameters are consistent with Grade I diastolic dysfunction (impaired relaxation). Indeterminate filling pressures. Right Ventricle: The right ventricular size is normal. No  increase in right ventricular wall thickness. Right ventricular systolic function is  normal. There is mildly elevated pulmonary artery systolic pressure. The tricuspid regurgitant velocity is 2.96  m/s, and with an assumed right atrial pressure of 3 mmHg, the estimated right ventricular systolic pressure is 38.0 mmHg. Left Atrium: Left atrial size was normal in size. Right Atrium: Right atrial size was normal in size. Pericardium: There is no evidence of pericardial effusion. Mitral Valve: The mitral valve is abnormal. Mild mitral annular calcification. Trivial mitral valve regurgitation. MV peak gradient, 5.8 mmHg. The mean mitral valve gradient is 3.0 mmHg. Tricuspid Valve: The tricuspid valve is grossly normal. Tricuspid valve regurgitation is mild. Aortic Valve: The aortic valve is tricuspid. Aortic valve regurgitation is trivial. Aortic valve sclerosis/calcification is present, without any evidence of aortic stenosis. Aortic valve mean gradient measures 4.0 mmHg. Aortic valve peak gradient measures 7.2 mmHg. Aortic valve area, by VTI measures 2.32 cm. Pulmonic Valve: The pulmonic valve was normal in structure. Pulmonic valve regurgitation is not visualized. Aorta: The aortic root and ascending aorta are structurally normal, with no evidence of dilitation. Venous: The inferior vena cava is normal in size with greater than 50% respiratory variability, suggesting right atrial pressure of 3 mmHg. IAS/Shunts: No atrial level shunt detected by color flow Doppler. Additional Comments: There is a moderate pleural effusion in both left and right lateral regions.  LEFT VENTRICLE PLAX 2D LVIDd:         5.10 cm      Diastology LVIDs:         4.00 cm      LV e' medial:    7.72 cm/s LV PW:         1.20 cm      LV E/e' medial:  9.9 LV IVS:        1.10 cm      LV e' lateral:   5.98 cm/s LVOT diam:     2.00 cm      LV E/e' lateral: 12.8 LV SV:         56 LV SV Index:   31 LVOT Area:     3.14 cm  LV Volumes (MOD) LV vol d, MOD  A2C: 140.5 ml LV vol d, MOD A4C: 167.0 ml LV vol s, MOD A2C: 88.6 ml LV vol s, MOD A4C: 104.0 ml LV SV MOD A2C:     51.9 ml LV SV MOD A4C:     167.0 ml LV SV MOD BP:      57.7 ml RIGHT VENTRICLE             IVC RV Basal diam:  3.70 cm     IVC diam: 1.90 cm RV S prime:     14.60 cm/s TAPSE (M-mode): 2.8 cm LEFT ATRIUM             Index        RIGHT ATRIUM           Index LA diam:        4.70 cm 2.56 cm/m   RA Area:     14.30 cm LA Vol (A2C):   62.1 ml 33.87 ml/m  RA Volume:   37.50 ml  20.45 ml/m LA Vol (A4C):   40.8 ml 22.25 ml/m LA Biplane Vol: 50.7 ml 27.65 ml/m  AORTIC VALVE AV Area (Vmax):    2.15 cm AV Area (Vmean):   2.02 cm AV Area (VTI):     2.32 cm AV Vmax:           134.00 cm/s AV Vmean:  92.800 cm/s AV VTI:            0.242 m AV Peak Grad:      7.2 mmHg AV Mean Grad:      4.0 mmHg LVOT Vmax:         91.50 cm/s LVOT Vmean:        59.800 cm/s LVOT VTI:          0.179 m LVOT/AV VTI ratio: 0.74  AORTA Ao Root diam: 3.30 cm MITRAL VALVE                TRICUSPID VALVE MV Area (PHT): 4.96 cm     TR Peak grad:   35.0 mmHg MV Area VTI:   2.63 cm     TR Vmax:        296.00 cm/s MV Peak grad:  5.8 mmHg MV Mean grad:  3.0 mmHg     SHUNTS MV Vmax:       1.20 m/s     Systemic VTI:  0.18 m MV Vmean:      76.6 cm/s    Systemic Diam: 2.00 cm MV Decel Time: 153 msec MV E velocity: 76.60 cm/s MV A velocity: 111.00 cm/s MV E/A ratio:  0.69 Zoila Shutter MD Electronically signed by Zoila Shutter MD Signature Date/Time: 04/21/2023/5:07:36 PM    Final     Cardiac Studies   2D echo 04/21/2023  IMPRESSIONS    1. Left ventricular ejection fraction, by estimation, is 40 to 45%. The  left ventricle has mildly decreased function. The left ventricle  demonstrates global hypokinesis. There is mild left ventricular  hypertrophy. Left ventricular diastolic parameters  are consistent with Grade I diastolic dysfunction (impaired relaxation).   2. Right ventricular systolic function is normal. The right  ventricular  size is normal. There is mildly elevated pulmonary artery systolic  pressure. The estimated right ventricular systolic pressure is 38.0 mmHg.   3. Moderate pleural effusion in both left and right lateral regions.   4. The mitral valve is abnormal. Trivial mitral valve regurgitation.   5. The aortic valve is tricuspid. Aortic valve regurgitation is trivial.  Aortic valve sclerosis/calcification is present, without any evidence of  aortic stenosis.   6. The inferior vena cava is normal in size with greater than 50%  respiratory variability, suggesting right atrial pressure of 3 mmHg.   Comparison(s): Changes from prior study are noted. 11/07/2022: LVEF 60-65%.   Patient Profile      Michael Bender is a 78 y.o. male with a hx of HTN and emphysema who is being seen 04/20/2023 for the evaluation of CHF at the request of Dr Hanley Ben.    Assessment & Plan    1  Acute combined systolic/diastolic CHF  -Pt presented with sob for several days    -2D echo with mildly reduced LVF EF 40-45%, G1DD and normal RV (EF decline new from 11/2022 EF 60-65%) -moderate pleural effusions on Cxray -was on Lasix 80mg  IV BID but stopped after SCr bumped -UOP 1.5L yesterday and net neg 5.5L since admit -No significant change in serum creatinine from yesterday at 1.7 to -Continue to hold diuretics -GDMT limited by AKI -hopefully will be able to add on Entresto 24-26mg  BID and spiro once renal function normalizes but in reviewing renal function over the past few months his SCr has been elevated at high as 2 -Hydralazine and Imdur added yesterday for GDMT -BP remains elevated so we will increase hydralazine to 25 mg 3 times  daily and continue Imdur 30 mg daily  2 Emphysema -has hx of emphysema which likely is contributing to SOB -Bilateral pleural effusions    3  HTN   -BP remains elevated -Continue amlodipine 5 mg daily -Increase hydralazine to 25 mg 3 times daily -Continue Imdur 30 mg  daily  4  Elevated trop    -hsTrop minimally elevated with flat trend and likely demand ischemia -LVF has decreased  from prior echo so also need to consider underlying CAD -AKI precludes cath at this time  5  Hx orthostasis/syncope    -Occurred in setting of severe infection  6 AKI     -SCr bumped to 1.72 from 1.56 and remains at 1.72 after holding diuretics yesterday -Continue to hold Lasix. -has had elevated SCr in the past  7  Hypokalemia -related to diuretics -Potassium 4.1 today -replete to keep K+>4  8 Hypomagnesemia -Mag 1.9>>replete to keep >2  I spent 35 minutes caring for this patient today face to face, ordering and reviewing labs, reviewing records from 2D echo (facility and dates), seeing the patient, documenting in the record   For questions or updates, please contact  HeartCare Please consult [email or web address] for contact info under        Signed, Armanda Magic, MD  04/23/2023, 7:46 AM

## 2023-04-24 ENCOUNTER — Encounter (HOSPITAL_COMMUNITY): Payer: Self-pay | Admitting: Emergency Medicine

## 2023-04-24 ENCOUNTER — Inpatient Hospital Stay (HOSPITAL_COMMUNITY)

## 2023-04-24 ENCOUNTER — Other Ambulatory Visit (HOSPITAL_COMMUNITY): Payer: Self-pay

## 2023-04-24 DIAGNOSIS — I5033 Acute on chronic diastolic (congestive) heart failure: Secondary | ICD-10-CM | POA: Diagnosis not present

## 2023-04-24 LAB — BASIC METABOLIC PANEL
Anion gap: 8 (ref 5–15)
BUN: 21 mg/dL (ref 8–23)
CO2: 26 mmol/L (ref 22–32)
Calcium: 8.3 mg/dL — ABNORMAL LOW (ref 8.9–10.3)
Chloride: 104 mmol/L (ref 98–111)
Creatinine, Ser: 1.77 mg/dL — ABNORMAL HIGH (ref 0.61–1.24)
GFR, Estimated: 39 mL/min — ABNORMAL LOW (ref >60)
Glucose, Bld: 107 mg/dL — ABNORMAL HIGH (ref 70–99)
Potassium: 3.9 mmol/L (ref 3.5–5.1)
Sodium: 138 mmol/L (ref 135–145)

## 2023-04-24 LAB — MAGNESIUM: Magnesium: 2 mg/dL (ref 1.7–2.4)

## 2023-04-24 MED ORDER — AMLODIPINE BESYLATE 5 MG PO TABS
5.0000 mg | ORAL_TABLET | Freq: Every day | ORAL | 0 refills | Status: DC
  Filled 2023-04-24: qty 30, 30d supply, fill #0

## 2023-04-24 MED ORDER — ENOXAPARIN SODIUM 30 MG/0.3ML IJ SOSY: 30.0000 mg | PREFILLED_SYRINGE | INTRAMUSCULAR | Status: DC

## 2023-04-24 MED ORDER — HYDRALAZINE HCL 50 MG PO TABS
50.0000 mg | ORAL_TABLET | Freq: Three times a day (TID) | ORAL | Status: DC
  Administered 2023-04-24: 50 mg via ORAL
  Filled 2023-04-24: qty 1

## 2023-04-24 MED ORDER — ISOSORBIDE MONONITRATE ER 60 MG PO TB24
60.0000 mg | ORAL_TABLET | Freq: Every day | ORAL | 0 refills | Status: DC
  Filled 2023-04-24: qty 30, 30d supply, fill #0

## 2023-04-24 MED ORDER — ISOSORBIDE MONONITRATE ER 60 MG PO TB24
60.0000 mg | ORAL_TABLET | Freq: Every day | ORAL | Status: DC
  Administered 2023-04-24: 60 mg via ORAL
  Filled 2023-04-24: qty 1

## 2023-04-24 MED ORDER — HYDRALAZINE HCL 50 MG PO TABS
50.0000 mg | ORAL_TABLET | Freq: Three times a day (TID) | ORAL | 0 refills | Status: DC
  Filled 2023-04-24: qty 90, 30d supply, fill #0

## 2023-04-24 NOTE — Progress Notes (Signed)
 PROGRESS NOTE    Michael Bender  QMV:[id number] DOB: 03/24/1945 DOA: 04/20/2023 PCP: Lula Olszewski, MD   Brief Narrative:  78 y.o. male with medical history significant of 78 y.o. male with a history of COPD, hypertension, hyperlipidemia, CVA, PAD, chronic pain, constipation, MSSA bacteremia and septic left wrist arthritis requiring surgical intervention by orthopedics and discitis of C5-C6 on MRI of C-spine in 11/2022 treated with 6 weeks of IV antibiotics (end of treatment 12/18/2022) presented with worsening exertional shortness of breath.  On presentation, BNP was 2352.1 and high-sensitivity troponins were 79 and 72.  Chest x-ray showed possible pulmonary congestion.  He was started on IV Lasix.  Cardiology was consulted.  Assessment & Plan:   Probable acute combined heart failure Positive troponins: Possibly from demand ischemia Acute respiratory failure with hypoxia -Strict input and output.  Daily weights.  Fluid restriction.  Negative balance of 5504.1 cc since admission. -Cardiology following: Echo showed EF of 40 to 45% with grade 1 diastolic dysfunction.  Diuretics as per cardiology.  Currently on hold due to slight AKI.  Continue Imdur and hydralazine as per cardiology. -Currently on room air   Hypokalemia -Improved   Anemia of chronic disease -From chronic illnesses.  Hemoglobin stable.  Monitor intermittently   Acute kidney injury on current kidney disease stage IIIa -Creatinine 1.77  today.  Monitor.    Essential hypertension -Monitor blood pressure.  Continue amlodipine, Imdur and hydralazine.    COPD -Continue nebs as needed.   Hyperlipidemia -continue statin   Chronic pain with opiate dependence -Continue home pain medication regimen.  Outpatient follow-up with PCP/pain management   DVT prophylaxis: Lovenox Code Status: Full Family Communication: Son at bedside on 04/20/2023 Disposition Plan: Status is: Inpatient Remains inpatient appropriate  because: Of severity of illness  Consultants: Cardiology  Procedures: 2D echo  Antimicrobials: None   Subjective: Patient seen and examined at bedside.  No fever, chest pain, vomiting reported.   Objective: Vitals:   04/24/23 0411 04/24/23 0432 04/24/23 0604 04/24/23 0705  BP: (!) 160/66  (!) 168/71 (!) 156/66  Pulse: 79   79  Resp: 17   17  Temp: 97.8 F (36.6 C)   97.7 F (36.5 C)  TempSrc: Oral   Oral  SpO2: 97%   96%  Weight:  62.1 kg    Height:  5\' 11"  (1.803 m)      Intake/Output Summary (Last 24 hours) at 04/24/2023 0735 Last data filed at 04/23/2023 1809 Gross per 24 hour  Intake 720 ml  Output 325 ml  Net 395 ml   Filed Weights   04/22/23 0502 04/23/23 0521 04/24/23 0432  Weight: 60.7 kg 61.5 kg 62.1 kg    Examination:  General: Currently on room air.  No distress.  Chronically ill and deconditioned looking. ENT/neck: No obvious JVD elevation or thyromegaly noted respiratory: Bilateral decreased breath sounds at bases with scattered crackles  CVS: S1-S2 heard; rate currently controlled  abdominal: Soft, nontender, distended slightly; no organomegaly, normal bowel sounds are heard Extremities: Trace lower extremity edema present; no cyanosis CNS: Awake and alert.  No obvious focal neurologic deficit.   Lymph: No lymphadenopathy palpable Skin: No obvious ecchymosis/lesions  psych: Mostly flat affect.  Currently not agitated musculoskeletal: No obvious joint swelling/tenderness   Data Reviewed: I have personally reviewed following labs and imaging studies  CBC: Recent Labs  Lab 04/20/23 0948 04/21/23 0258  WBC 6.6 5.8  NEUTROABS 5.1  --   HGB 8.7* 8.7*  HCT 26.3*  26.6*  MCV 91.0 91.1  PLT 224 225   Basic Metabolic Panel: Recent Labs  Lab 04/20/23 0948 04/21/23 0258 04/22/23 0239 04/23/23 0237 04/24/23 0305  NA 137 140 140 139 138  K 2.8* 3.3* 3.2* 4.1 3.9  CL 102 105 101 104 104  CO2 27 27 28 27 26   GLUCOSE 135* 111* 115* 139* 107*   BUN 17 17 19  25* 21  CREATININE 1.39* 1.56* 1.72* 1.72* 1.77*  CALCIUM 8.1* 8.2* 8.6* 8.5* 8.3*  MG 1.9 1.9 1.8 1.9 2.0   GFR: Estimated Creatinine Clearance: 30.2 mL/min (A) (by C-G formula based on SCr of 1.77 mg/dL (H)). Liver Function Tests: Recent Labs  Lab 04/21/23 0258  AST 19  ALT 7  ALKPHOS 84  BILITOT 0.5  PROT 6.0*  ALBUMIN 2.6*   No results for input(s): "LIPASE", "AMYLASE" in the last 168 hours. No results for input(s): "AMMONIA" in the last 168 hours. Coagulation Profile: No results for input(s): "INR", "PROTIME" in the last 168 hours. Cardiac Enzymes: No results for input(s): "CKTOTAL", "CKMB", "CKMBINDEX", "TROPONINI" in the last 168 hours. BNP (last 3 results) No results for input(s): "PROBNP" in the last 8760 hours. HbA1C: No results for input(s): "HGBA1C" in the last 72 hours. CBG: No results for input(s): "GLUCAP" in the last 168 hours. Lipid Profile: No results for input(s): "CHOL", "HDL", "LDLCALC", "TRIG", "CHOLHDL", "LDLDIRECT" in the last 72 hours. Thyroid Function Tests: No results for input(s): "TSH", "T4TOTAL", "FREET4", "T3FREE", "THYROIDAB" in the last 72 hours. Anemia Panel: No results for input(s): "VITAMINB12", "FOLATE", "FERRITIN", "TIBC", "IRON", "RETICCTPCT" in the last 72 hours. Sepsis Labs: No results for input(s): "PROCALCITON", "LATICACIDVEN" in the last 168 hours.  Recent Results (from the past 240 hours)  Resp panel by RT-PCR (RSV, Flu A&B, Covid) Anterior Nasal Swab     Status: None   Collection Time: 04/20/23  9:48 AM   Specimen: Anterior Nasal Swab  Result Value Ref Range Status   SARS Coronavirus 2 by RT PCR NEGATIVE NEGATIVE Final    Comment: (NOTE) SARS-CoV-2 target nucleic acids are NOT DETECTED.  The SARS-CoV-2 RNA is generally detectable in upper respiratory specimens during the acute phase of infection. The lowest concentration of SARS-CoV-2 viral copies this assay can detect is 138 copies/mL. A negative result  does not preclude SARS-Cov-2 infection and should not be used as the sole basis for treatment or other patient management decisions. A negative result may occur with  improper specimen collection/handling, submission of specimen other than nasopharyngeal swab, presence of viral mutation(s) within the areas targeted by this assay, and inadequate number of viral copies(<138 copies/mL). A negative result must be combined with clinical observations, patient history, and epidemiological information. The expected result is Negative.  Fact Sheet for Patients:  BloggerCourse.com  Fact Sheet for Healthcare Providers:  SeriousBroker.it  This test is no t yet approved or cleared by the [place] FDA and  has been authorized for detection and/or diagnosis of SARS-CoV-2 by FDA under an Emergency Use Authorization (EUA). This EUA will remain  in effect (meaning this test can be used) for the duration of the COVID-19 declaration under Section 564(b)(1) of the Act, 21 U.S.C.section 360bbb-3(b)(1), unless the authorization is terminated  or revoked sooner.       Influenza A by PCR NEGATIVE NEGATIVE Final   Influenza B by PCR NEGATIVE NEGATIVE Final    Comment: (NOTE) The Xpert Xpress SARS-CoV-2/FLU/RSV plus assay is intended as an aid in the diagnosis of influenza from Nasopharyngeal  swab specimens and should not be used as a sole basis for treatment. Nasal washings and aspirates are unacceptable for Xpert Xpress SARS-CoV-2/FLU/RSV testing.  Fact Sheet for Patients: BloggerCourse.com  Fact Sheet for Healthcare Providers: SeriousBroker.it  This test is not yet approved or cleared by the [place] FDA and has been authorized for detection and/or diagnosis of SARS-CoV-2 by FDA under an Emergency Use Authorization (EUA). This EUA will remain in effect (meaning this test can be used) for  the duration of the COVID-19 declaration under Section 564(b)(1) of the Act, 21 U.S.C. section 360bbb-3(b)(1), unless the authorization is terminated or revoked.     Resp Syncytial Virus by PCR NEGATIVE NEGATIVE Final    Comment: (NOTE) Fact Sheet for Patients: BloggerCourse.com  Fact Sheet for Healthcare Providers: SeriousBroker.it  This test is not yet approved or cleared by the [place] FDA and has been authorized for detection and/or diagnosis of SARS-CoV-2 by FDA under an Emergency Use Authorization (EUA). This EUA will remain in effect (meaning this test can be used) for the duration of the COVID-19 declaration under Section 564(b)(1) of the Act, 21 U.S.C. section 360bbb-3(b)(1), unless the authorization is terminated or revoked.  Performed at [facility], [address], [place], [place] [address]          Radiology Studies: No results found.       Scheduled Meds:  amitriptyline  50 mg Oral QHS   amLODipine  5 mg Oral Daily   enoxaparin (LOVENOX) injection  40 mg Subcutaneous Q24H   hydrALAZINE  25 mg Oral Q8H   isosorbide mononitrate  30 mg Oral Daily   pantoprazole  40 mg Oral Daily   rosuvastatin  40 mg Oral QHS   senna-docusate  1 tablet Oral BID   Continuous Infusions:        Glade Lloyd, MD [facility] 04/24/2023, 7:35 AM

## 2023-04-24 NOTE — Progress Notes (Signed)
   Heart Failure Stewardship Pharmacist Progress Note   PCP: Lula Olszewski, MD PCP-Cardiologist: None    HPI:  78 y.o. male with PMHx of GERD, PAD, HLD, COPD, CVA, orthostatic hypotension/syncope (in setting of MSSA bacteremia in 10/2022), and tobacco dependence.  Pt was seen  by PCP on 03/20 for worsening SOB and DOE. Significant wheezing and bilateral crackles on physical exam suggested pulmonary congestion. He was advised to go to the ER for further workup. CXR on 03/20 showed cardiomegaly with vascular congestion and small bilateral pleural effusions and bibasilar atelectasis. Bilateral lower extremity edema seen on exam. She was given Lasix 40 mg IV in the ED. BNP 2352. ECHO on 03/21 revealed LVEF of 40-45% (60-65% in Oct 2024), LV has mildly decreased function, global hypokinesis, mild LVH, GI DD, mildly elevated pulmonary artery systolic pressure, moderate pleural effusion in both left and right lateral regions, MV is abnormal, trivial MV regurgitation, and right atrial pressure of 3 mmHg. IV lasix stopped 03/21 d/t AKI.   Today he states he feels much better. He denied SOB. He has been able to walk around today and denied DOE. No lower extremity edema on exam. He does have a pill box at home that his son helps him organize. He does not own a scale, but may be able to borrow one from a neighbor.   Current HF Medications: Other:  -Amlodipine 5 mg every day -Hydralazine 25 mg Q8H -Isosorbide mononitrate 30 mg every day   Prior to admission HF Medications: Beta blocker: Metoprolol tartrate 25 mg BID (patient reported not taking) Other: Amlodipine 10 mg every day(last dispensed Oct 2024 for 30 ds; not taking)  Pertinent Lab Values: Serum creatinine 1.77 (<1.72<1.72), BUN 21(<25<19), Potassium 3.9 (<4.1<3.2), Sodium 138 (<139<140), BNP 2352.1, Magnesium 2 (<1.9), A1c 6.5 (Oct 2024)  Vital Signs: Weight: 136.9 lbs (admission weight: 148.4 lbs) Blood pressure: 145/66-168/71 (156/66  @0705 ) Heart rate: 79-87 (79 @0705 )  I/O: net -0.18 L yesterday; net -5.5 L since admission  Medication Assistance / Insurance Benefits Check: Does the patient have prescription insurance?  Yes Type of insurance plan: [employer]   Outpatient Pharmacy:  Prior to admission outpatient pharmacy: [facility] in [place]  Is the patient willing to use [facility] pharmacy at discharge? Yes Is the patient willing to transition their outpatient pharmacy to utilize a  [facility] outpatient pharmacy?   No    Assessment 1. Acute HFmrEF (LVEF 40-45%). NYHA class I symptoms. - Strict I/Os and daily weights. Keep K>4, Mg>2. -Continue Amlodipine 5 mg daily -Increase Imdur to 60 mg every day -Increase Hydralazine 50 mg Q8H  -No BB due to severe COPD  -Consider switching to [facility] pending AKI resolution    Plan: 1) Medication changes recommended at this time: -Increase Imdur to 60 mg every day -Increase Hydralazine 50 mg Q8H  -Consider switching to Entresto pending AKI resolution   2) Patient assistance: -Patient agreeable to [facility] pharmacy at discharge   3)  Education  - To be completed prior to discharge  Sofie Rower, PharmD [employer] PGY-1

## 2023-04-24 NOTE — Discharge Summary (Signed)
 Physician Discharge Summary  Michael Bender:[id number] DOB: Feb 18, 1945 DOA: 04/20/2023  PCP: Lula Olszewski, MD  Admit date: 04/20/2023 Discharge date: 04/24/2023  Admitted From: Home Disposition: Home  Recommendations for Outpatient Follow-up:  Follow up with PCP in 1 week with repeat CBC/BMP Follow up in ED if symptoms worsen or new appear   Home Health: No Equipment/Devices: None  Discharge Condition: Stable CODE STATUS: Full Diet recommendation: Heart healthy  Brief/Interim Summary: 78 y.o. male with medical history significant of 78 y.o. male with a history of COPD, hypertension, hyperlipidemia, CVA, PAD, chronic pain, constipation, MSSA bacteremia and septic left wrist arthritis requiring surgical intervention by orthopedics and discitis of C5-C6 on MRI of C-spine in 11/2022 treated with 6 weeks of IV antibiotics (end of treatment 12/18/2022) presented with worsening exertional shortness of breath.  On presentation, BNP was 2352.1 and high-sensitivity troponins were 79 and 72.  Chest x-ray showed possible pulmonary congestion.  He was started on IV Lasix.  Cardiology was consulted.  During the hospitalization, he has diuresed well.  Echo showed EF of 40 to 45% with grade 1 diastolic dysfunction.  Subsequently, diuretics held by cardiology because of increasing creatinine.  Cardiology has adjusted GDMT.  Cardiology has cleared him for discharge.  He will be discharged home today with outpatient follow-up with PCP and cardiology.    Discharge Diagnoses:   Probable acute combined heart failure Positive troponins: Possibly from demand ischemia Acute respiratory failure with hypoxia -Has diuresed well during this hospitalization.  Negative balance of 5504.1 cc since admission.  Continue diet and fluid restriction. -Cardiology following: Echo showed EF of 40 to 45% with grade 1 diastolic dysfunction.  Diuretics on hold as per cardiology.  Currently on hold due to slight AKI.   Continue Imdur and hydralazine as per cardiology. -Currently on room air -Cardiology has cleared him for discharge.  He will be discharged home today with outpatient follow-up with PCP and cardiology.   Hypokalemia -Improved   Anemia of chronic disease -From chronic illnesses.  Hemoglobin stable.  Monitor intermittently as an outpatient   Acute kidney injury on current kidney disease stage IIIa -Creatinine 1.77  today.  Monitor as an outpatient.     Essential hypertension -Continue amlodipine, Imdur and hydralazine.  Doses adjusted during this hospitalization.   COPD -Continue as needed albuterol  Hyperlipidemia -continue statin   Chronic pain with opiate dependence -Continue home pain medication regimen.  Outpatient follow-up with PCP/pain management  Discharge Instructions  Discharge Instructions     Diet - low sodium heart healthy   Complete by: As directed    Increase activity slowly   Complete by: As directed       Allergies as of 04/24/2023       Reactions   Nubain [nalbuphine Hcl]    Muscle contraction        Medication List     STOP taking these medications    cefadroxil 500 MG capsule Commonly known as: DURICEF   metoprolol tartrate 25 MG tablet Commonly known as: LOPRESSOR   potassium chloride SA 20 MEQ tablet Commonly known as: KLOR-CON M       TAKE these medications    amitriptyline 50 MG tablet Commonly known as: ELAVIL Take 1 tablet (50 mg total) by mouth at bedtime.   amLODipine 5 MG tablet Commonly known as: NORVASC Take 1 tablet (5 mg total) by mouth daily. Start taking on: April 25, 2023 What changed:  medication strength how much to take  cyclobenzaprine 10 MG tablet Commonly known as: FLEXERIL Take 1 tablet (10 mg total) by mouth 3 (three) times daily as needed.   hydrALAZINE 50 MG tablet Commonly known as: APRESOLINE Take 1 tablet (50 mg total) by mouth every 8 (eight) hours.   ipratropium-albuterol 0.5-2.5 (3)  MG/3ML Soln Commonly known as: DUONEB Take 3 mLs by nebulization every 4 (four) hours as needed.   isosorbide mononitrate 60 MG 24 hr tablet Commonly known as: IMDUR Take 1 tablet (60 mg total) by mouth daily. Start taking on: April 25, 2023   Nebulizer/Tubing/Mouthpiece Kit 1 each by Does not apply route every 4 (four) hours as needed.   Nebulizer/Tubing/Mouthpiece Kit 1 each by Does not apply route every 4 (four) hours as needed.   Oxycodone HCl 10 MG Tabs Take 1 tablet (10 mg total) by mouth every 8 (eight) hours as needed.   pantoprazole 40 MG tablet Commonly known as: PROTONIX TAKE 1 TABLET BY MOUTH EVERY DAY BEFORE BREAKFAST   polyethylene glycol 17 g packet Commonly known as: MIRALAX / GLYCOLAX Take 17 g by mouth daily as needed for moderate constipation.   rosuvastatin 40 MG tablet Commonly known as: CRESTOR TAKE 1 TABLET BY MOUTH DAILY. REPLACES ATORVASTATIN (STOP ATORVASTATIN IF STILL TAKING)   SUMAtriptan 50 MG tablet Commonly known as: IMITREX TAKE 1 TABLET (50 MG TOTAL) BY MOUTH DAILY. MAY REPEAT IN 2 HOURS IF HEADACHE PERSISTS OR RECURS.        Follow-up Information     Lula Olszewski, MD Follow up on 05/03/2023.   Specialty: Internal Medicine Why: 11 am for hospital follow up Contact information: [address] [place] [address] [phone]         Lula Olszewski, MD. Call in 1 week(s).   Specialty: Internal Medicine Contact information: [address] [place] [address] [phone]         [facility]. Go in 6 day(s).   Specialty: Cardiology Why: Hospital follow up 05/01/2023 @ 10:15 am PLEASE bring a current medication list to appointment FREE valet parking, Entrance C, off [employer] information: [address] [place] [address] [phone]               Allergies  Allergen Reactions   Nubain [Nalbuphine Hcl]     Muscle  contraction    Consultations: Cardiology   Procedures/Studies: DG Chest 2 View Result Date: 04/24/2023 CLINICAL DATA:  Shortness of breath.  CHF. EXAM: CHEST - 2 VIEW COMPARISON:  Chest radiograph dated 04/20/2023. FINDINGS: Background of emphysema. There is blunting of the costophrenic angles which may be chronic and scarring or represent trace pleural effusions. Significant improvement in aeration of the lungs. No consolidative changes or pneumothorax. The cardiac silhouette is within limits. Atherosclerotic calcification of the aorta. No acute osseous pathology. IMPRESSION: 1. Significant improvement in aeration of the lungs. 2. Small bilateral pleural effusions. 3. Emphysema. Electronically Signed   By: Elgie Collard M.D.   On: 04/24/2023 14:22   ECHOCARDIOGRAM COMPLETE Result Date: 04/21/2023    ECHOCARDIOGRAM REPORT   Patient Name:   Michael Bender Date of Exam: 04/21/2023 Medical Rec #:  [id number]        Height:       71.0 in Accession #:    [id number]       Weight:       144.0 lb Date of Birth:  1945-02-17       BSA:  1.834 m Patient Age:    77 years         BP:           168/64 mmHg Patient Gender: M                HR:           94 bpm. Exam Location:  Inpatient Procedure: 2D Echo, Cardiac Doppler, Color Doppler and Intracardiac            Opacification Agent (Both Spectral and Color Flow Doppler were            utilized during procedure). Indications:    Dyspnea  History:        Patient has prior history of Echocardiogram examinations, most                 recent 11/07/2022. COPD; Risk Factors:Hypertension, Dyslipidemia                 and Current Smoker.  Sonographer:    Karma Ganja Referring Phys: [id number] PAULA V ROSS  Sonographer Comments: Technically difficult study due to poor echo windows. Image acquisition challenging due to patient body habitus, Image acquisition challenging due to COPD and Image acquisition challenging due to respiratory motion. IMPRESSIONS  1. Left  ventricular ejection fraction, by estimation, is 40 to 45%. The left ventricle has mildly decreased function. The left ventricle demonstrates global hypokinesis. There is mild left ventricular hypertrophy. Left ventricular diastolic parameters are consistent with Grade I diastolic dysfunction (impaired relaxation).  2. Right ventricular systolic function is normal. The right ventricular size is normal. There is mildly elevated pulmonary artery systolic pressure. The estimated right ventricular systolic pressure is 38.0 mmHg.  3. Moderate pleural effusion in both left and right lateral regions.  4. The mitral valve is abnormal. Trivial mitral valve regurgitation.  5. The aortic valve is tricuspid. Aortic valve regurgitation is trivial. Aortic valve sclerosis/calcification is present, without any evidence of aortic stenosis.  6. The inferior vena cava is normal in size with greater than 50% respiratory variability, suggesting right atrial pressure of 3 mmHg. Comparison(s): Changes from prior study are noted. 11/07/2022: LVEF 60-65%. FINDINGS  Left Ventricle: Left ventricular ejection fraction, by estimation, is 40 to 45%. The left ventricle has mildly decreased function. The left ventricle demonstrates global hypokinesis. Definity contrast agent was given IV to delineate the left ventricular  endocardial borders. The left ventricular internal cavity size was normal in size. There is mild left ventricular hypertrophy. Left ventricular diastolic parameters are consistent with Grade I diastolic dysfunction (impaired relaxation). Indeterminate filling pressures. Right Ventricle: The right ventricular size is normal. No increase in right ventricular wall thickness. Right ventricular systolic function is normal. There is mildly elevated pulmonary artery systolic pressure. The tricuspid regurgitant velocity is 2.96  m/s, and with an assumed right atrial pressure of 3 mmHg, the estimated right ventricular systolic pressure is  38.0 mmHg. Left Atrium: Left atrial size was normal in size. Right Atrium: Right atrial size was normal in size. Pericardium: There is no evidence of pericardial effusion. Mitral Valve: The mitral valve is abnormal. Mild mitral annular calcification. Trivial mitral valve regurgitation. MV peak gradient, 5.8 mmHg. The mean mitral valve gradient is 3.0 mmHg. Tricuspid Valve: The tricuspid valve is grossly normal. Tricuspid valve regurgitation is mild. Aortic Valve: The aortic valve is tricuspid. Aortic valve regurgitation is trivial. Aortic valve sclerosis/calcification is present, without any evidence of aortic stenosis. Aortic valve mean gradient measures 4.0 mmHg.  Aortic valve peak gradient measures 7.2 mmHg. Aortic valve area, by VTI measures 2.32 cm. Pulmonic Valve: The pulmonic valve was normal in structure. Pulmonic valve regurgitation is not visualized. Aorta: The aortic root and ascending aorta are structurally normal, with no evidence of dilitation. Venous: The inferior vena cava is normal in size with greater than 50% respiratory variability, suggesting right atrial pressure of 3 mmHg. IAS/Shunts: No atrial level shunt detected by color flow Doppler. Additional Comments: There is a moderate pleural effusion in both left and right lateral regions.  LEFT VENTRICLE PLAX 2D LVIDd:         5.10 cm      Diastology LVIDs:         4.00 cm      LV e' medial:    7.72 cm/s LV PW:         1.20 cm      LV E/e' medial:  9.9 LV IVS:        1.10 cm      LV e' lateral:   5.98 cm/s LVOT diam:     2.00 cm      LV E/e' lateral: 12.8 LV SV:         56 LV SV Index:   31 LVOT Area:     3.14 cm  LV Volumes (MOD) LV vol d, MOD A2C: 140.5 ml LV vol d, MOD A4C: 167.0 ml LV vol s, MOD A2C: 88.6 ml LV vol s, MOD A4C: 104.0 ml LV SV MOD A2C:     51.9 ml LV SV MOD A4C:     167.0 ml LV SV MOD BP:      57.7 ml RIGHT VENTRICLE             IVC RV Basal diam:  3.70 cm     IVC diam: 1.90 cm RV S prime:     14.60 cm/s TAPSE (M-mode): 2.8 cm  LEFT ATRIUM             Index        RIGHT ATRIUM           Index LA diam:        4.70 cm 2.56 cm/m   RA Area:     14.30 cm LA Vol (A2C):   62.1 ml 33.87 ml/m  RA Volume:   37.50 ml  20.45 ml/m LA Vol (A4C):   40.8 ml 22.25 ml/m LA Biplane Vol: 50.7 ml 27.65 ml/m  AORTIC VALVE AV Area (Vmax):    2.15 cm AV Area (Vmean):   2.02 cm AV Area (VTI):     2.32 cm AV Vmax:           134.00 cm/s AV Vmean:          92.800 cm/s AV VTI:            0.242 m AV Peak Grad:      7.2 mmHg AV Mean Grad:      4.0 mmHg LVOT Vmax:         91.50 cm/s LVOT Vmean:        59.800 cm/s LVOT VTI:          0.179 m LVOT/AV VTI ratio: 0.74  AORTA Ao Root diam: 3.30 cm MITRAL VALVE                TRICUSPID VALVE MV Area (PHT): 4.96 cm     TR Peak grad:   35.0 mmHg MV Area VTI:  2.63 cm     TR Vmax:        296.00 cm/s MV Peak grad:  5.8 mmHg MV Mean grad:  3.0 mmHg     SHUNTS MV Vmax:       1.20 m/s     Systemic VTI:  0.18 m MV Vmean:      76.6 cm/s    Systemic Diam: 2.00 cm MV Decel Time: 153 msec MV E velocity: 76.60 cm/s MV A velocity: 111.00 cm/s MV E/A ratio:  0.69 Zoila Shutter MD Electronically signed by Zoila Shutter MD Signature Date/Time: 04/21/2023/5:07:36 PM    Final    DG Chest Port 1 View Result Date: 04/20/2023 CLINICAL DATA:  Shortness of breath. EXAM: PORTABLE CHEST 1 VIEW COMPARISON:  Chest radiograph dated 11/05/2022. FINDINGS: Cardiomegaly with vascular congestion. Small bilateral flu shin is and bibasilar atelectasis, left greater than right. Pneumonia is not excluded. No pneumothorax. Atherosclerotic calcification of the aorta. No acute osseous pathology. IMPRESSION: 1. Cardiomegaly with vascular congestion. 2. Small bilateral pleural effusions and bibasilar atelectasis. Electronically Signed   By: Elgie Collard M.D.   On: 04/20/2023 10:32      Subjective: Patient seen and examined at bedside.  No fever, chest pain, vomiting reported.    Discharge Exam: Vitals:   04/24/23 1050 04/24/23 1502  BP: (!)  165/80 (!) 154/53  Pulse: 92   Resp: 20   Temp: 98 F (36.7 C)   SpO2: 97%     General: Currently on room air.  No distress.  Chronically ill and deconditioned looking. ENT/neck: No obvious JVD elevation or thyromegaly noted respiratory: Bilateral decreased breath sounds at bases with scattered crackles  CVS: S1-S2 heard; rate currently controlled  abdominal: Soft, nontender, distended slightly; no organomegaly, normal bowel sounds are heard Extremities: Trace lower extremity edema present; no cyanosis CNS: Awake and alert.  No obvious focal neurologic deficit.   Lymph: No lymphadenopathy palpable Skin: No obvious ecchymosis/lesions  psych: Mostly flat affect.  Currently not agitated musculoskeletal: No obvious joint swelling/tenderness    The results of significant diagnostics from this hospitalization (including imaging, microbiology, ancillary and laboratory) are listed below for reference.     Microbiology: Recent Results (from the past 240 hours)  Resp panel by RT-PCR (RSV, Flu A&B, Covid) Anterior Nasal Swab     Status: None   Collection Time: 04/20/23  9:48 AM   Specimen: Anterior Nasal Swab  Result Value Ref Range Status   SARS Coronavirus 2 by RT PCR NEGATIVE NEGATIVE Final    Comment: (NOTE) SARS-CoV-2 target nucleic acids are NOT DETECTED.  The SARS-CoV-2 RNA is generally detectable in upper respiratory specimens during the acute phase of infection. The lowest concentration of SARS-CoV-2 viral copies this assay can detect is 138 copies/mL. A negative result does not preclude SARS-Cov-2 infection and should not be used as the sole basis for treatment or other patient management decisions. A negative result may occur with  improper specimen collection/handling, submission of specimen other than nasopharyngeal swab, presence of viral mutation(s) within the areas targeted by this assay, and inadequate number of viral copies(<138 copies/mL). A negative result must be  combined with clinical observations, patient history, and epidemiological information. The expected result is Negative.  Fact Sheet for Patients:  BloggerCourse.com  Fact Sheet for Healthcare Providers:  SeriousBroker.it  This test is no t yet approved or cleared by the [place] FDA and  has been authorized for detection and/or diagnosis of SARS-CoV-2 by FDA under an Emergency Use  Authorization (EUA). This EUA will remain  in effect (meaning this test can be used) for the duration of the COVID-19 declaration under Section 564(b)(1) of the Act, 21 U.S.C.section 360bbb-3(b)(1), unless the authorization is terminated  or revoked sooner.       Influenza A by PCR NEGATIVE NEGATIVE Final   Influenza B by PCR NEGATIVE NEGATIVE Final    Comment: (NOTE) The Xpert Xpress SARS-CoV-2/FLU/RSV plus assay is intended as an aid in the diagnosis of influenza from Nasopharyngeal swab specimens and should not be used as a sole basis for treatment. Nasal washings and aspirates are unacceptable for Xpert Xpress SARS-CoV-2/FLU/RSV testing.  Fact Sheet for Patients: BloggerCourse.com  Fact Sheet for Healthcare Providers: SeriousBroker.it  This test is not yet approved or cleared by the [place] FDA and has been authorized for detection and/or diagnosis of SARS-CoV-2 by FDA under an Emergency Use Authorization (EUA). This EUA will remain in effect (meaning this test can be used) for the duration of the COVID-19 declaration under Section 564(b)(1) of the Act, 21 U.S.C. section 360bbb-3(b)(1), unless the authorization is terminated or revoked.     Resp Syncytial Virus by PCR NEGATIVE NEGATIVE Final    Comment: (NOTE) Fact Sheet for Patients: BloggerCourse.com  Fact Sheet for Healthcare Providers: SeriousBroker.it  This test is not yet  approved or cleared by the [place] FDA and has been authorized for detection and/or diagnosis of SARS-CoV-2 by FDA under an Emergency Use Authorization (EUA). This EUA will remain in effect (meaning this test can be used) for the duration of the COVID-19 declaration under Section 564(b)(1) of the Act, 21 U.S.C. section 360bbb-3(b)(1), unless the authorization is terminated or revoked.  Performed at [facility], [address] [place], [place] [address]      Labs: BNP (last 3 results) Recent Labs    04/20/23 0943  BNP 2,352.1*   Basic Metabolic Panel: Recent Labs  Lab 04/20/23 0948 04/21/23 0258 04/22/23 0239 04/23/23 0237 04/24/23 0305  NA 137 140 140 139 138  K 2.8* 3.3* 3.2* 4.1 3.9  CL 102 105 101 104 104  CO2 27 27 28 27 26   GLUCOSE 135* 111* 115* 139* 107*  BUN 17 17 19  25* 21  CREATININE 1.39* 1.56* 1.72* 1.72* 1.77*  CALCIUM 8.1* 8.2* 8.6* 8.5* 8.3*  MG 1.9 1.9 1.8 1.9 2.0   Liver Function Tests: Recent Labs  Lab 04/21/23 0258  AST 19  ALT 7  ALKPHOS 84  BILITOT 0.5  PROT 6.0*  ALBUMIN 2.6*   No results for input(s): "LIPASE", "AMYLASE" in the last 168 hours. No results for input(s): "AMMONIA" in the last 168 hours. CBC: Recent Labs  Lab 04/20/23 0948 04/21/23 0258  WBC 6.6 5.8  NEUTROABS 5.1  --   HGB 8.7* 8.7*  HCT 26.3* 26.6*  MCV 91.0 91.1  PLT 224 225   Cardiac Enzymes: No results for input(s): "CKTOTAL", "CKMB", "CKMBINDEX", "TROPONINI" in the last 168 hours. BNP: Invalid input(s): "POCBNP" CBG: No results for input(s): "GLUCAP" in the last 168 hours. D-Dimer No results for input(s): "DDIMER" in the last 72 hours. Hgb A1c No results for input(s): "HGBA1C" in the last 72 hours. Lipid Profile No results for input(s): "CHOL", "HDL", "LDLCALC", "TRIG", "CHOLHDL", "LDLDIRECT" in the last 72 hours. Thyroid function studies No results for input(s): "TSH", "T4TOTAL", "T3FREE", "THYROIDAB" in the last 72  hours.  Invalid input(s): "FREET3" Anemia work up No results for input(s): "VITAMINB12", "FOLATE", "FERRITIN", "TIBC", "IRON", "RETICCTPCT" in the last 72  hours. Urinalysis    Component Value Date/Time   COLORURINE YELLOW 02/17/2023 1754   APPEARANCEUR CLEAR 02/17/2023 1754   LABSPEC 1.011 02/17/2023 1754   PHURINE 5.0 02/17/2023 1754   GLUCOSEU NEGATIVE 02/17/2023 1754   HGBUR MODERATE (A) 02/17/2023 1754   BILIRUBINUR NEGATIVE 02/17/2023 1754   BILIRUBINUR n 09/08/2015 1402   KETONESUR NEGATIVE 02/17/2023 1754   PROTEINUR 30 (A) 02/17/2023 1754   UROBILINOGEN 0.2 09/08/2015 1402   NITRITE NEGATIVE 02/17/2023 1754   LEUKOCYTESUR NEGATIVE 02/17/2023 1754   Sepsis Labs Recent Labs  Lab 04/20/23 0948 04/21/23 0258  WBC 6.6 5.8   Microbiology Recent Results (from the past 240 hours)  Resp panel by RT-PCR (RSV, Flu A&B, Covid) Anterior Nasal Swab     Status: None   Collection Time: 04/20/23  9:48 AM   Specimen: Anterior Nasal Swab  Result Value Ref Range Status   SARS Coronavirus 2 by RT PCR NEGATIVE NEGATIVE Final    Comment: (NOTE) SARS-CoV-2 target nucleic acids are NOT DETECTED.  The SARS-CoV-2 RNA is generally detectable in upper respiratory specimens during the acute phase of infection. The lowest concentration of SARS-CoV-2 viral copies this assay can detect is 138 copies/mL. A negative result does not preclude SARS-Cov-2 infection and should not be used as the sole basis for treatment or other patient management decisions. A negative result may occur with  improper specimen collection/handling, submission of specimen other than nasopharyngeal swab, presence of viral mutation(s) within the areas targeted by this assay, and inadequate number of viral copies(<138 copies/mL). A negative result must be combined with clinical observations, patient history, and epidemiological information. The expected result is Negative.  Fact Sheet for Patients:   BloggerCourse.com  Fact Sheet for Healthcare Providers:  SeriousBroker.it  This test is no t yet approved or cleared by the [place] FDA and  has been authorized for detection and/or diagnosis of SARS-CoV-2 by FDA under an Emergency Use Authorization (EUA). This EUA will remain  in effect (meaning this test can be used) for the duration of the COVID-19 declaration under Section 564(b)(1) of the Act, 21 U.S.C.section 360bbb-3(b)(1), unless the authorization is terminated  or revoked sooner.       Influenza A by PCR NEGATIVE NEGATIVE Final   Influenza B by PCR NEGATIVE NEGATIVE Final    Comment: (NOTE) The Xpert Xpress SARS-CoV-2/FLU/RSV plus assay is intended as an aid in the diagnosis of influenza from Nasopharyngeal swab specimens and should not be used as a sole basis for treatment. Nasal washings and aspirates are unacceptable for Xpert Xpress SARS-CoV-2/FLU/RSV testing.  Fact Sheet for Patients: BloggerCourse.com  Fact Sheet for Healthcare Providers: SeriousBroker.it  This test is not yet approved or cleared by the [place] FDA and has been authorized for detection and/or diagnosis of SARS-CoV-2 by FDA under an Emergency Use Authorization (EUA). This EUA will remain in effect (meaning this test can be used) for the duration of the COVID-19 declaration under Section 564(b)(1) of the Act, 21 U.S.C. section 360bbb-3(b)(1), unless the authorization is terminated or revoked.     Resp Syncytial Virus by PCR NEGATIVE NEGATIVE Final    Comment: (NOTE) Fact Sheet for Patients: BloggerCourse.com  Fact Sheet for Healthcare Providers: SeriousBroker.it  This test is not yet approved or cleared by the [place] FDA and has been authorized for detection and/or diagnosis of SARS-CoV-2 by FDA under an Emergency Use  Authorization (EUA). This EUA will remain in effect (meaning this test can be used) for the duration of  the COVID-19 declaration under Section 564(b)(1) of the Act, 21 U.S.C. section 360bbb-3(b)(1), unless the authorization is terminated or revoked.  Performed at [facility], [address] [place], [place] [address]      Time coordinating discharge: 35 minutes  SIGNED:   Glade Lloyd, MD  [facility] 04/24/2023, 3:14 PM

## 2023-04-24 NOTE — Plan of Care (Signed)
  Problem: Health Behavior/Discharge Planning: Goal: Ability to manage health-related needs will improve Outcome: Progressing   Problem: Clinical Measurements: Goal: Will remain free from infection Outcome: Progressing Goal: Respiratory complications will improve Outcome: Progressing Goal: Cardiovascular complication will be avoided Outcome: Progressing   Problem: Activity: Goal: Risk for activity intolerance will decrease Outcome: Progressing   Problem: Safety: Goal: Ability to remain free from injury will improve Outcome: Progressing   Problem: Cardiac: Goal: Ability to achieve and maintain adequate cardiopulmonary perfusion will improve Outcome: Progressing   Problem: Cardiac: Goal: Ability to achieve and maintain adequate cardiopulmonary perfusion will improve Outcome: Progressing

## 2023-04-24 NOTE — Progress Notes (Signed)
 Heart Failure Nurse Navigator Progress Note  PCP: Lula Olszewski, MD PCP-Cardiologist: None Admission Diagnosis: Acute congestive heart failure, Shortness of breath.  Admitted from: Home  Presentation:   Michael Bender presented with shortness of breath x 4 days, BP 176/68, HR 80, +1 pitting edema, BLE , BNP 2,352, CXR with cardiomegaly with vascular congestion and small bilateral atelectasis.   Patient [facility]) was educated on the sign and symptoms of heart failure, daily weights, when to call his doctor or go to the ED, Diet/ fluid restrictions, patient reported to drinking about 1 Liter of Dr. Reino Kent daily and water. Continued education on taking all his medications and attending all his medical appointments. We called his son to verify he was able to bring him on his follow up appointment date. Patient verbalized his understanding of all education, a HF TOC appointment was scheduled for 05/01/2023 @ 10:15 am.   ECHO/ LVEF: 40-45%  Clinical Course:  Past Medical History:  Diagnosis Date   Arthritis    Back   Balance problems 02/21/2023   Blood transfusion    as a child   Chronic back pain    COPD (chronic obstructive pulmonary disease) (HCC)    Disturbance of skin sensation 05/22/2018   Effusion of right knee 02/21/2023   Effusion of right knee joint 01/16/2023   Essential hypertension 03/05/2007   Qualifier: Diagnosis of   By: Tawanna Cooler MD, Tinnie Gens A      Gastric erosion    Gastritis and gastroduodenitis    Gastrointestinal hemorrhage with melena 11/20/2018   GIB (gastrointestinal bleeding) 07/18/2019   Headache(784.0)    last one 6 months ago   Hemorrhoids, internal    History of duodenal ulcer    History of lumbar fusion 03/03/2022   RE-OPERATIVE DIAGNOSIS:  lumbar stenosis synovial cyst lumbar spondylosis spondylolisthesis lumbar radiculoapthy L4/5   PROCEDURE:  Procedure(s): POSTERIOR LUMBAR FUSION 1 LEVEL with resection of synovial cyst     History of upper  gastrointestinal bleeding 03/03/2022   Duodenal ulcer 2021 Dr. [name]   HLD (hyperlipidemia)    Hypertension    Loss of weight    Wt Readings from Last 10 Encounters:  04/05/22  146 lb (66.2 kg)  03/03/22  143 lb 9.6 oz (65.1 kg)  07/14/21  153 lb (69.4 kg)  12/14/20  147 lb 12.8 oz (67 kg)  11/13/19  154 lb (69.9 kg)  09/16/19  146 lb (66.2 kg)  08/06/19  146 lb (66.2 kg)  07/19/19  144 lb 13.5 oz (65.7 kg)  07/17/19  148 lb (67.1 kg)  07/09/19  147 lb 9.6 oz (67 kg)         Neck rigidity    post cervical fusion   Nocturia    PAIN, CHRONIC NEC 10/06/2006   Qualifier: Diagnosis of   By: Tawanna Cooler RN, Alvino Chapel       Pneumonia    Prostate disease    S/P cervical spinal fusion 03/03/2022   With persistent cervical pain and palpable screws Led to disability History of attempt to dig furrow in skull to fix it Last surgery 1992     Social History   Socioeconomic History   Marital status: Divorced    Spouse name: Not on file   Number of children: 2   Years of education: Not on file   Highest education level: Not on file  Occupational History   Occupation: Disabled  Tobacco Use   Smoking status: Some Days    Current  packs/day: 0.00    Types: Cigarettes    Start date: 12/12/1978    Last attempt to quit: 12/12/2018    Years since quitting: 4.3   Smokeless tobacco: Never  Vaping Use   Vaping status: Never Used  Substance and Sexual Activity   Alcohol use: Not Currently    Alcohol/week: 2.0 standard drinks of alcohol    Types: 2 Shots of liquor per week   Drug use: No   Sexual activity: Not on file  Other Topics Concern   Not on file  Social History Narrative   Patient is divorced 2 sons he is a retired Engineer, 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  He drinks about a liter of caffeinated drinks a day past smoker no drug use no alcohol.   Social Drivers of Corporate investment banker Strain: Low Risk  (10/13/2022)   Overall Financial Resource Strain  (CARDIA)    Difficulty of Paying Living Expenses: Not hard at all  Food Insecurity: No Food Insecurity (04/20/2023)   Hunger Vital Sign    Worried About Running Out of Food in the Last Year: Never true    Ran Out of Food in the Last Year: Never true  Transportation Needs: No Transportation Needs (04/20/2023)   PRAPARE - Administrator, Civil Service (Medical): No    Lack of Transportation (Non-Medical): No  Physical Activity: Insufficiently Active (10/13/2022)   Exercise Vital Sign    Days of Exercise per Week: 3 days    Minutes of Exercise per Session: 30 min  Stress: No Stress Concern Present (10/13/2022)   [employer] of Occupational Health - Occupational Stress Questionnaire    Feeling of Stress : Not at all  Social Connections: Socially Isolated (04/20/2023)   Social Connection and Isolation Panel [NHANES]    Frequency of Communication with Friends and Family: Twice a week    Frequency of Social Gatherings with Friends and Family: More than three times a week    Attends Religious Services: Never    Database administrator or Organizations: No    Attends Banker Meetings: Never    Marital Status: Widowed   Education Assessment and Provision:  Detailed education and instructions provided on heart failure disease management including the following:  Signs and symptoms of Heart Failure When to call the physician Importance of daily weights Low sodium diet Fluid restriction Medication management Anticipated future follow-up appointments  Patient education given on each of the above topics.  Patient acknowledges understanding via teach back method and acceptance of all instructions.  Education Materials:  "Living Better With Heart Failure" Booklet, HF zone tool, & Daily Weight Tracker Tool.  Patient has scale at home: No, will buy one Patient has pill box at home: yes    High Risk Criteria for Readmission and/or Poor Patient Outcomes: Heart failure  hospital admissions (last 6 months): 1  No Show rate: 2 % Difficult social situation: No, lives alone Demonstrates medication adherence: yes Primary Language: English ( HOH) Literacy level: Reading, writing, and comprehension  Barriers of Care:   Diet/ fluid restrictions ( soda, salt) Daily weights   Considerations/Referrals:   Referral made to Heart Failure Pharmacist Stewardship: yes Referral made to Heart Failure CSW/NCM TOC: NA Referral made to Heart & Vascular TOC clinic: Yes, 05/01/2023 @ 10:15 am, son will drive him to appointment   Items for Follow-up on DC/TOC: Continued HF education Diet/ fluid restrictions (  salt/ soda) Daily weights   Rhae Hammock, BSN, RN Heart Failure Print production planner Chat Only

## 2023-04-24 NOTE — Plan of Care (Signed)
   Problem: Education: Goal: Knowledge of General Education information will improve Description: Including pain rating scale, medication(s)/side effects and non-pharmacologic comfort measures Outcome: Progressing   Problem: Clinical Measurements: Goal: Ability to maintain clinical measurements within normal limits will improve Outcome: Progressing Goal: Diagnostic test results will improve Outcome: Progressing Goal: Respiratory complications will improve Outcome: Progressing   Problem: Activity: Goal: Risk for activity intolerance will decrease Outcome: Progressing

## 2023-04-24 NOTE — Progress Notes (Signed)
 Rounding Note    Patient Name: Michael Bender Date of Encounter: 04/24/2023 [facility]Care Cardiologist:   Subjective   Pt says his breathing is much better from last week  Eager to go home   Inpatient Medications    Scheduled Meds:  amitriptyline  50 mg Oral QHS   amLODipine  5 mg Oral Daily   enoxaparin (LOVENOX) injection  40 mg Subcutaneous Q24H   hydrALAZINE  25 mg Oral Q8H   isosorbide mononitrate  30 mg Oral Daily   pantoprazole  40 mg Oral Daily   rosuvastatin  40 mg Oral QHS   senna-docusate  1 tablet Oral BID   Continuous Infusions:  PRN Meds: acetaminophen **OR** acetaminophen, albuterol, hydrALAZINE, ondansetron **OR** ondansetron (ZOFRAN) IV, oxyCODONE, polyethylene glycol   Vital Signs    Vitals:   04/24/23 0411 04/24/23 0432 04/24/23 0604 04/24/23 0705  BP: (!) 160/66  (!) 168/71 (!) 156/66  Pulse: 79   79  Resp: 17   17  Temp: 97.8 F (36.6 C)   97.7 F (36.5 C)  TempSrc: Oral   Oral  SpO2: 97%   96%  Weight:  62.1 kg    Height:  5\' 11"  (1.803 m)      Intake/Output Summary (Last 24 hours) at 04/24/2023 0708 Last data filed at 04/23/2023 1809 Gross per 24 hour  Intake 720 ml  Output 325 ml  Net 395 ml      04/24/2023    4:32 AM 04/23/2023    5:21 AM 04/22/2023    5:02 AM  Last 3 Weights  Weight (lbs) 136 lb 14.5 oz 135 lb 8 oz 133 lb 13.1 oz  Weight (kg) 62.1 kg 61.462 kg 60.7 kg      Telemetry    Sinus   - Personally Reviewed  ECG    No new EKG to review - Personally Reviewed  Physical Exam   GEN: Well nourished, well developed in no acute distress HEENT: Normal NECK: No JVD; CARDIAC:RRR, no murmur RESPIRATORY:  Clear to auscultation ABDOMEN: Soft, non-tender, non-distended MUSCULOSKELETAL:  No edema;  Labs    High Sensitivity Troponin:   Recent Labs  Lab 04/20/23 0948 04/20/23 1120  TROPONINIHS 79* 72*     Chemistry Recent Labs  Lab 04/21/23 0258 04/22/23 0239 04/23/23 0237 04/24/23 0305  NA 140 140  139 138  K 3.3* 3.2* 4.1 3.9  CL 105 101 104 104  CO2 27 28 27 26   GLUCOSE 111* 115* 139* 107*  BUN 17 19 25* 21  CREATININE 1.56* 1.72* 1.72* 1.77*  CALCIUM 8.2* 8.6* 8.5* 8.3*  MG 1.9 1.8 1.9 2.0  PROT 6.0*  --   --   --   ALBUMIN 2.6*  --   --   --   AST 19  --   --   --   ALT 7  --   --   --   ALKPHOS 84  --   --   --   BILITOT 0.5  --   --   --   GFRNONAA 45* 40* 40* 39*  ANIONGAP 8 11 8 8     Lipids No results for input(s): "CHOL", "TRIG", "HDL", "LABVLDL", "LDLCALC", "CHOLHDL" in the last 168 hours.  Hematology Recent Labs  Lab 04/20/23 0948 04/21/23 0258  WBC 6.6 5.8  RBC 2.89* 2.92*  HGB 8.7* 8.7*  HCT 26.3* 26.6*  MCV 91.0 91.1  MCH 30.1 29.8  MCHC 33.1 32.7  RDW 14.2 14.3  PLT 224 225   Thyroid No results for input(s): "TSH", "FREET4" in the last 168 hours.  BNP Recent Labs  Lab 04/20/23 0943  BNP 2,352.1*    DDimer No results for input(s): "DDIMER" in the last 168 hours.   Radiology    No results found.   Cardiac Studies   2D echo 04/21/2023  IMPRESSIONS    1. Left ventricular ejection fraction, by estimation, is 40 to 45%. The  left ventricle has mildly decreased function. The left ventricle  demonstrates global hypokinesis. There is mild left ventricular  hypertrophy. Left ventricular diastolic parameters  are consistent with Grade I diastolic dysfunction (impaired relaxation).   2. Right ventricular systolic function is normal. The right ventricular  size is normal. There is mildly elevated pulmonary artery systolic  pressure. The estimated right ventricular systolic pressure is 38.0 mmHg.   3. Moderate pleural effusion in both left and right lateral regions.   4. The mitral valve is abnormal. Trivial mitral valve regurgitation.   5. The aortic valve is tricuspid. Aortic valve regurgitation is trivial.  Aortic valve sclerosis/calcification is present, without any evidence of  aortic stenosis.   6. The inferior vena cava is normal in size  with greater than 50%  respiratory variability, suggesting right atrial pressure of 3 mmHg.   Comparison(s): Changes from prior study are noted. 11/07/2022: LVEF 60-65%.   Patient Profile      Michael Bender is a 78 y.o. male with a hx of HTN and emphysema who is being seen 04/20/2023 for the evaluation of CHF at the request of Dr Hanley Ben.    Assessment & Plan    1  Acute combined systolic/diastolic CHF  -Pt presented with sob for several days   No chest pain   -2D echo with mildly reduced LVF EF 40-45%, G1DD and normal RV (EF decline new from 11/2022 EF 60-65%) -moderate pleural effusions on Cxray -net neg 5.5 L   -SCr bumped 1.72 to 1.77    -hold further diuretics -GDMT limited by AKI Currently on hydralazine and imdur   (along with amlodipine ) Will increase dose for better BP contorl   REnal function prohibts use of Entresto and spironolactone at this point Severe COPD limits b blocker use   Pt denies CP    Will need to have LVEF reassessed in 3 months   If still down will need ischemic eval   2 Emphysema -has hx of emphysema which likely is contributing to SOB -Bilateral pleural effusions on admit Will get PA/LAt CXR today     3  HTN   -THis admit added amlodipine, hydralazine and imdur along with lasxi     Today will increase hydralazine to 50 tid and Imdur to 60    4  Elevated trop    -hsTrop minimally elevated with flat trend and likely demand ischemia -LVF has decreased some from prior echo so also need to consider underlying CAD -AKI precludes cath at this time  5  Hx orthostasis/syncope    -Occurred in setting of severe infection  Keep on low Na diet     If patient ambulates and sats are OK then I think he can go home   He is very eager to go Will make sure he has follow up for BMET , BNP in 1 wk and outpt clinic appt   For questions or updates, please contact [facility] Please consult [email or web address] for contact info under  Signed, Dietrich Pates, MD  04/24/2023, 7:08 AM

## 2023-04-24 NOTE — TOC Transition Note (Addendum)
 Transition of Care [facility]) - Discharge Note   Patient Details  Name: Michael Bender MRN: [id number] Date of Birth: 11-28-1945  [facility] of [facility] CM/SW Contact:  Leone Haven, RN Phone Number: 04/24/2023, 3:45 PM   Clinical Narrative:    For dc today, NCM offered choice for HHPT, and rollator, he states he has no preference.  NCM made referral to [facility] with [facility].  She is able to take referral.  Soc will begin 24 to 48 hrs post dc.  NCM made referral to [facility] with [employer] for rollator.  This will be brought to patient prior to dc.  TOC Pharmacy to fill medications.   Final next level of care: Home w Home Health Services Barriers to Discharge: No Barriers Identified   Patient Goals and CMS Choice Patient states their goals for this hospitalization and ongoing recovery are:: return home CMS Medicare.gov Compare Post Acute Care list provided to:: Patient Choice offered to / list presented to : Patient      Discharge Placement                       Discharge Plan and Services Additional resources added to the After Visit Summary for   In-house Referral: NA   Post Acute Care Choice: NA          DME Arranged: Walker rolling with seat DME Agency: [employer] Date DME Agency Contacted: 04/24/23 Time DME Agency Contacted: 1545 Representative spoke with at DME Agency: Vaughan Basta HH Arranged: PT HH Agency: [facility] Date [facility] Agency Contacted: 04/24/23 Time HH Agency Contacted: 1545 Representative spoke with at [facility] Agency: Haywood Lasso  Social Drivers of Health (SDOH) Interventions SDOH Screenings   Food Insecurity: No Food Insecurity (04/20/2023)  Housing: Low Risk  (04/24/2023)  Transportation Needs: No Transportation Needs (04/24/2023)  Utilities: Not At Risk (04/20/2023)  Alcohol Screen: Low Risk  (04/24/2023)  Depression (PHQ2-9): Low Risk  (04/19/2023)  Financial Resource Strain: Low Risk  (04/24/2023)  Physical Activity: Insufficiently  Active (10/13/2022)  Social Connections: Socially Isolated (04/20/2023)  Stress: No Stress Concern Present (10/13/2022)  Tobacco Use: Medium Risk (04/24/2023)  Health Literacy: Inadequate Health Literacy (10/13/2022)     Readmission Risk Interventions    04/21/2023    4:14 PM 11/14/2022   11:34 AM  Readmission Risk Prevention Plan  Transportation Screening Complete Complete  PCP or Specialist Appt within 3-5 Days Complete Complete  HRI or Home Care Consult Complete Complete  Social Work Consult for Recovery Care Planning/Counseling Complete Complete  Palliative Care Screening Not Applicable Complete  Medication Review Oceanographer) Complete Complete

## 2023-04-24 NOTE — Progress Notes (Signed)
 Physical Therapy Treatment Patient Details Name: Michael Bender MRN: [id number] DOB: Dec 01, 1945 Today's Date: 04/24/2023   History of Present Illness Michael Bender is a 78 y.o. male admitted 04/20/23 with acute diastolic heart failure. Pt presented with worsening SOB, positive troponins, and elevated BNP. Chest x-ray showed vascular congestion and small B pleural effusions and bibasilar atelectasis. PMH of COPD, HTN, HLD, CVA, PAD, chronic pain, and constipation.    PT Comments  Pt is making great progress towards his acute PT goals demonstrated by less physical assistance required with functional mobility. He performed bed mobility and transfers with modI. He increased his gait distance, ambulating ~454ft using rollator with supervision. Pt denied SOB/DOE during today's session. Reviewed HEP to maintain BLE ROM/strength. Will continue to follow acutely and advance appropriately.     If plan is discharge home, recommend the following: A little help with walking and/or transfers;A little help with bathing/dressing/bathroom;Assist for transportation;Assistance with cooking/housework   Can travel by Pension scheme manager (4 wheels)    Recommendations for Other Services       Precautions / Restrictions Precautions Precautions: Fall Recall of Precautions/Restrictions: Intact Restrictions Weight Bearing Restrictions Per Provider Order: No     Mobility  Bed Mobility Overal bed mobility: Modified Independent Bed Mobility: Supine to Sit, Sit to Supine     Supine to sit: HOB elevated, Modified independent (Device/Increase time) Sit to supine: HOB elevated, Modified independent (Device/Increase time)   General bed mobility comments: Pt sat up on R side of bed without physical assistance. He returned to supine and repositioned himself in the center of the bed.    Transfers Overall transfer level: Modified independent Equipment used: None,  Rollator (4 wheels) Transfers: Sit to/from Stand             General transfer comment: Pt stood with and without an AD from the lowest bed height. VC to educate pt on hand positioning and sequencing using rollator. He demonstrated ability to correct lock/unlock the device during transfers. Good eccentric control with sitting.    Ambulation/Gait Ambulation/Gait assistance: Supervision Gait Distance (Feet): 400 Feet Assistive device: Rollator (4 wheels) Gait Pattern/deviations: Step-through pattern, Decreased stride length, Trunk flexed Gait velocity: WFL Gait velocity interpretation: 1.31 - 2.62 ft/sec, indicative of limited community ambulator   General Gait Details: Pt ambulated with a reciprocal gait pattern, slight fwd trunk flex over AD, even weight shift, and good foot clearence. He maintained body close to rollator at all times and manuever obstacles well in the hallway. Discussed breaking features and how that could be helpful during different path grades.   Stairs Stairs:  (Pt declined stair training as he doesn't have any at his home.)           Wheelchair Mobility     Tilt Bed    Modified Rankin (Stroke Patients Only)       Balance Overall balance assessment: Mild deficits observed, not formally tested                                          Communication Communication Communication: Impaired Factors Affecting Communication: Hearing impaired  Cognition Arousal: Alert Behavior During Therapy: WFL for tasks assessed/performed   PT - Cognitive impairments: No apparent impairments  Following commands: Intact      Cueing Cueing Techniques: Verbal cues  Exercises General Exercises - Lower Extremity Quad Sets: Supine, Both, Strengthening, 10 reps Hip ABduction/ADduction: Supine, Both, 10 reps, Strengthening Straight Leg Raises: Supine, Both, 10 reps, Strengthening    General Comments General  comments (skin integrity, edema, etc.): VSS on RA. Reviewed HEP of BLE exercises that could be performed supine or seated. Encouraged pt to transfer to recliner chair 3x/day and go on walks with nursing/mobility 2x/day.      Pertinent Vitals/Pain Pain Assessment Pain Assessment: No/denies pain    Home Living                          Prior Function            PT Goals (current goals can now be found in the care plan section) Acute Rehab PT Goals Patient Stated Goal: Return Home Progress towards PT goals: Progressing toward goals    Frequency    Min 2X/week      PT Plan      Co-evaluation              AM-PAC PT "6 Clicks" Mobility   Outcome Measure  Help needed turning from your back to your side while in a flat bed without using bedrails?: None Help needed moving from lying on your back to sitting on the side of a flat bed without using bedrails?: None Help needed moving to and from a bed to a chair (including a wheelchair)?: None Help needed standing up from a chair using your arms (e.g., wheelchair or bedside chair)?: A Little Help needed to walk in hospital room?: A Little Help needed climbing 3-5 steps with a railing? : A Little 6 Click Score: 21    End of Session Equipment Utilized During Treatment: Gait belt Activity Tolerance: Patient tolerated treatment well Patient left: in bed;with call bell/phone within reach Nurse Communication: Mobility status PT Visit Diagnosis: Unsteadiness on feet (R26.81);Difficulty in walking, not elsewhere classified (R26.2);Other abnormalities of gait and mobility (R26.89)     Time: 1400-1415 PT Time Calculation (min) (ACUTE ONLY): 15 min  Charges:    $Gait Training: 8-22 mins PT General Charges $$ ACUTE PT VISIT: 1 Visit                     Michael Bender, PT, DPT Acute Rehabilitation Services Office: [phone] Secure Chat Preferred  Richardson Chiquito 04/24/2023, 3:29 PM

## 2023-04-24 NOTE — Consult Note (Addendum)
 [facility] Liaison Consult Note   04/24/2023  Michael Bender [birth year] [id number]  Insurance: Monia Pouch Medicare  Primary Care Provider: Lula Olszewski, MD  with [place] at [facility], this provider is listed for the transition of care follow up appointments  and [facility] calls   3:48 pm [facility] Liaison met patient at bedside at [facility] Resting no family at bedside currently. Nurse reviewing with patient. Patient is hard of hearing, confirms PCP, son arrived and going over instructions with nurse. Patient states he has a follow up with PCP.    The patient was screened for 4 day hospitalization with noted extreme risk score for unplanned readmission risk 3 hospital admissions in 6 months.  The patient was assessed for potential [facility] Coordination service needs for post hospital transition for care coordination. Review of patient's electronic medical record reveals patient is to follow up with HF clinic.   Plan: [facility] Liaison will continue to follow progress and disposition to assess for post hospital community care coordination/management needs.  Referral request for community care coordination: Anticipate Community TOC follow up calls.   [facility] Community Care, Population Health does not replace or interfere with any arrangements made by the Inpatient Transition of Care team.   For questions contact:   Charlesetta Shanks, RN, BSN, CCM [place]  [facility], [facility] Liaison Direct Dial: [phone] or secure chat Email: [email or web address]

## 2023-04-25 ENCOUNTER — Telehealth: Payer: Self-pay | Admitting: *Deleted

## 2023-04-25 NOTE — Transitions of Care (Post Inpatient/ED Visit) (Signed)
   04/25/2023  Name: Michael Bender MRN: [id number] DOB: Jun 04, 1945  Today's TOC FU Call Status: Today's TOC FU Call Status:: Unsuccessful Call (1st Attempt) Unsuccessful Call (1st Attempt) Date: 04/25/23  Attempted to reach the patient regarding the most recent Inpatient/ED visit.  Follow Up Plan: Additional outreach attempts will be made to reach the patient to complete the Transitions of Care (Post Inpatient/ED visit) call.   Irving Shows [facility], BSN RN Care Manager/ Transition of Care [place]/ [facility] [phone]

## 2023-04-25 NOTE — Progress Notes (Signed)
 HEART & VASCULAR TRANSITION OF CARE CONSULT NOTE   Referring Physician: Dr. Hanley Ben PCP: Lula Olszewski, MD  Cardiologist: Dr. Tenny Craw  HPI: Referred to clinic by Dr. Hanley Ben for heart failure consultation.   Michael Bender is a 78 y.o. male with a hx of HTN, emphysema and new diagnosis of HFmrEF.  Echo 10/24 showed EF 60-65%.  He had a recent admission for falls weakness and rhabdomyolysis who was discharged to skilled nursing facility but then brought back with sepsis due to MSSA bacteremia 02/2023. Required 6 weeks of IV abx, followed by ID.   Admitted 3/25 with a/c HF. Echo showed  EF 40-45%, G1DD and normal RV. Diuresed with IV lasix. Had AKI so cath deferred. GDMT titrated and he was discharged home, weight 136 lbs.  Today he presents to [facility] for post hospital follow up with his son [name]. Overall feeling fine. He is not SOB with ADLs or walking on flat ground, not physically active. Lives alone but sons live nearby and help with pill box. Denies palpitations, abnormal bleeding, CP, dizziness, edema, or PND/Orthopnea. Appetite ok; drinks Dr Reino Kent and coffee. No fever or chills. Weight at home 144-146 pounds. Taking all medications. No EOTH, quit smoking 4 years ago, no drugs. Son says patient's memory is declining. No recent falls.  Family Hx: mother with MI Social Hx: no tobacco or drugs, stopped smoking 4 years ago  Cardiac Testing  - Echo 3/25: EF 40-45%, mild LVH, normal RV - Echo 10/24: EF 60-65%, normal RV  Past Medical History:  Diagnosis Date   Arthritis    Back   Balance problems 02/21/2023   Blood transfusion    as a child   Chronic back pain    COPD (chronic obstructive pulmonary disease) (HCC)    Disturbance of skin sensation 05/22/2018   Effusion of right knee 02/21/2023   Effusion of right knee joint 01/16/2023   Essential hypertension 03/05/2007   Qualifier: Diagnosis of   By: Tawanna Cooler MD, Tinnie Gens A      Gastric erosion    Gastritis and gastroduodenitis     Gastrointestinal hemorrhage with melena 11/20/2018   GIB (gastrointestinal bleeding) 07/18/2019   Headache(784.0)    last one 6 months ago   Hemorrhoids, internal    History of duodenal ulcer    History of lumbar fusion 03/03/2022   RE-OPERATIVE DIAGNOSIS:  lumbar stenosis synovial cyst lumbar spondylosis spondylolisthesis lumbar radiculoapthy L4/5   PROCEDURE:  Procedure(s): POSTERIOR LUMBAR FUSION 1 LEVEL with resection of synovial cyst     History of upper gastrointestinal bleeding 03/03/2022   Duodenal ulcer 2021 Dr. [name]   HLD (hyperlipidemia)    Hypertension    Loss of weight    Wt Readings from Last 10 Encounters:  04/05/22  146 lb (66.2 kg)  03/03/22  143 lb 9.6 oz (65.1 kg)  07/14/21  153 lb (69.4 kg)  12/14/20  147 lb 12.8 oz (67 kg)  11/13/19  154 lb (69.9 kg)  09/16/19  146 lb (66.2 kg)  08/06/19  146 lb (66.2 kg)  07/19/19  144 lb 13.5 oz (65.7 kg)  07/17/19  148 lb (67.1 kg)  07/09/19  147 lb 9.6 oz (67 kg)         Neck rigidity    post cervical fusion   Nocturia    PAIN, CHRONIC NEC 10/06/2006   Qualifier: Diagnosis of   By: Everett Graff       Pneumonia  Prostate disease    S/P cervical spinal fusion 03/03/2022   With persistent cervical pain and palpable screws Led to disability History of attempt to dig furrow in skull to fix it Last surgery 1992   Current Outpatient Medications  Medication Sig Dispense Refill   amitriptyline (ELAVIL) 50 MG tablet Take 1 tablet (50 mg total) by mouth at bedtime. 90 tablet 3   amLODipine (NORVASC) 5 MG tablet Take 1 tablet (5 mg total) by mouth daily. 30 tablet 0   cyclobenzaprine (FLEXERIL) 10 MG tablet Take 1 tablet (10 mg total) by mouth 3 (three) times daily as needed. 270 tablet 3   hydrALAZINE (APRESOLINE) 50 MG tablet Take 1 tablet (50 mg total) by mouth every 8 (eight) hours. 90 tablet 0   ipratropium-albuterol (DUONEB) 0.5-2.5 (3) MG/3ML SOLN Take 3 mLs by nebulization every 4 (four) hours as needed. 90 mL 1    isosorbide mononitrate (IMDUR) 60 MG 24 hr tablet Take 1 tablet (60 mg total) by mouth daily. 30 tablet 0   Oxycodone HCl 10 MG TABS Take 1 tablet (10 mg total) by mouth every 8 (eight) hours as needed. 90 tablet 0   pantoprazole (PROTONIX) 40 MG tablet TAKE 1 TABLET BY MOUTH EVERY DAY BEFORE BREAKFAST 90 tablet 0   polyethylene glycol (MIRALAX / GLYCOLAX) 17 g packet Take 17 g by mouth daily as needed for moderate constipation. 14 each 0   Respiratory Therapy Supplies (NEBULIZER/TUBING/MOUTHPIECE) KIT 1 each by Does not apply route every 4 (four) hours as needed. 1 kit 1   Respiratory Therapy Supplies (NEBULIZER/TUBING/MOUTHPIECE) KIT 1 each by Does not apply route every 4 (four) hours as needed. 1 kit 1   rosuvastatin (CRESTOR) 40 MG tablet TAKE 1 TABLET BY MOUTH DAILY. REPLACES ATORVASTATIN (STOP ATORVASTATIN IF STILL TAKING) 90 tablet 3   SUMAtriptan (IMITREX) 50 MG tablet TAKE 1 TABLET (50 MG TOTAL) BY MOUTH DAILY. MAY REPEAT IN 2 HOURS IF HEADACHE PERSISTS OR RECURS. 9 tablet 1   No current facility-administered medications for this encounter.   Allergies  Allergen Reactions   Nubain [Nalbuphine Hcl]     Muscle contraction   Social History   Socioeconomic History   Marital status: Widowed    Spouse name: Not on file   Number of children: 2   Years of education: Not on file   Highest education level: High school graduate  Occupational History   Occupation: Disabled  Tobacco Use   Smoking status: Former    Current packs/day: 0.00    Types: Cigarettes    Start date: 12/12/1978    Quit date: 12/12/2018    Years since quitting: 4.3   Smokeless tobacco: Never  Vaping Use   Vaping status: Never Used  Substance and Sexual Activity   Alcohol use: Not Currently    Alcohol/week: 2.0 standard drinks of alcohol    Types: 2 Shots of liquor per week   Drug use: No   Sexual activity: Not on file  Other Topics Concern   Not on file  Social History Narrative   Patient is divorced  2 sons he is a retired Engineer, 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  He drinks about a liter of caffeinated drinks a day past smoker no drug use no alcohol.   Social Drivers of Health   Financial Resource Strain: Low Risk  (04/24/2023)   Overall Financial Resource Strain (CARDIA)    Difficulty of Paying Living Expenses:  Not very hard  Food Insecurity: No Food Insecurity (04/26/2023)   Hunger Vital Sign    Worried About Running Out of Food in the Last Year: Never true    Ran Out of Food in the Last Year: Never true  Transportation Needs: No Transportation Needs (04/26/2023)   PRAPARE - Administrator, Civil Service (Medical): No    Lack of Transportation (Non-Medical): No  Physical Activity: Insufficiently Active (10/13/2022)   Exercise Vital Sign    Days of Exercise per Week: 3 days    Minutes of Exercise per Session: 30 min  Stress: No Stress Concern Present (10/13/2022)   [employer] of Occupational Health - Occupational Stress Questionnaire    Feeling of Stress : Not at all  Social Connections: Socially Isolated (04/20/2023)   Social Connection and Isolation Panel [NHANES]    Frequency of Communication with Friends and Family: Twice a week    Frequency of Social Gatherings with Friends and Family: More than three times a week    Attends Religious Services: Never    Database administrator or Organizations: No    Attends Banker Meetings: Never    Marital Status: Widowed  Intimate Partner Violence: Not At Risk (04/26/2023)   Humiliation, Afraid, Rape, and Kick questionnaire    Fear of Current or Ex-Partner: No    Emotionally Abused: No    Physically Abused: No    Sexually Abused: No   Family History  Problem Relation Age of Onset   Heart disease Mother    Heart attack Mother 64   Pancreatic cancer Father 66   Pancreatic cancer Brother    Anesthesia problems Neg Hx    Colon cancer Neg Hx    Liver cancer Neg Hx     Stomach cancer Neg Hx    Esophageal cancer Neg Hx    Rectal cancer Neg Hx    Wt Readings from Last 3 Encounters:  05/01/23 67 kg (147 lb 9.6 oz)  04/24/23 62.1 kg (136 lb 14.5 oz)  04/20/23 69.8 kg (153 lb 12.8 oz)   BP (!) 160/60   Pulse 78   Wt 67 kg (147 lb 9.6 oz)   SpO2 98%   BMI 20.59 kg/m   PHYSICAL EXAM: General:  NAD. No resp difficulty, walked into clinic, chronically-ill appearing, thin HEENT: Normal Neck: Supple. No JVD. Cor: Regular rate & rhythm. No rubs, gallops or murmurs. Lungs: Clear, diminished in bases Abdomen: Soft, nontender, nondistended.  Extremities: No cyanosis, clubbing, rash, edema Neuro: Alert & oriented x 3, moves all 4 extremities w/o difficulty. Affect pleasant.  ECG (personally reviewed): NSR 77 bpm  ReDs reading: 29%, normal  ASSESSMENT & PLAN: HFmrEF - Echo 10/24: EF 60-65% - Echo 3/25: EF 40-45% - Unclear etiology for reduction in EF. Could be related to recent MSSA sepsis vs uncontrolled HTN. - Cath deferred due to AKI on CKD. Will arrange cMRI to evaluate for infiltrative disease. - NYHA II, volume OK. ReDs normal at 29%. - Start losartan 25 mg daily. - Continue hydralazine 50 mg tid + Imdur 30 mg daily. - Consider bisoprolol next - Would avoid SGLT2i with concern for hygiene and volume depletion - Renal function may prevent spiro - Not a candidate for advanced therapies with significant COPD and CKD.  - Recommend repeating echo in 3 months - Labs today, repeat BMET in 10-14 days.  2. CKD 3 - Last SCr 1.7 - Baseline SCr ~ 1.4-2 - Follow renal function closely  with addition of ARB  3. HTN - BP elevated, but has not had AM meds yet - Start losartan as above. - Continue amlodipine, would wean to push GDMT - Given Rx for BP cuff and asked to check BP  4. COPD - Significant emphysema - per PCP  NYHA II GDMT  Diuretic: does not need BB: not yet Ace/ARB/ARNI: add losartan 25 mg daily MRA: not yet, CKD may prevent  use SGLT2i: n/a with concern for volume depletion & hygiene  Referred to HFSW (PCP, Medications, Transportation, ETOH Abuse, Drug Abuse, Insurance, Surveyor, quantity ): No Refer to Pharmacy: No Refer to Home Health: No Refer to Advanced Heart Failure Clinic: No Refer to General Cardiology: No, established with Dr. Tenny Craw  Follow up in 2 weeks with TOC APP (add bisoprolol), then back to Gen Cards.  Prince Rome, FNP-BC 05/01/23

## 2023-04-26 ENCOUNTER — Telehealth: Payer: Self-pay | Admitting: *Deleted

## 2023-04-26 ENCOUNTER — Encounter: Payer: Self-pay | Admitting: *Deleted

## 2023-04-26 NOTE — Transitions of Care (Post Inpatient/ED Visit) (Signed)
 04/26/2023  Name: Michael Bender MRN: [id number] DOB: August 25, 1945  Today's TOC FU Call Status: Today's TOC FU Call Status:: Successful TOC FU Call Completed TOC FU Call Complete Date: 04/26/23 Patient's Name and Date of Birth confirmed.  Transition Care Management Follow-up Telephone Call Date of Discharge: 04/24/23 Discharge Facility: [facility]) Type of Discharge: Inpatient Admission How have you been since you were released from the hospital?: Better (son states pt is feeling better, eating & drinking well, ambulating with cane) Any questions or concerns?: No  Items Reviewed: Did you receive and understand the discharge instructions provided?: Yes Medications obtained,verified, and reconciled?: Yes (Medications Reviewed) Any new allergies since your discharge?: No Dietary orders reviewed?: Yes Type of Diet Ordered:: low sodium  heart healthy Do you have support at home?: Yes People in Home: alone Name of Support/Comfort Primary Source: son states he and his brother and several others check on and see pt daily, very involved in patient's care Patient's son declined further outreach, has contact number for RN Care Manager Reviewed Heart failure action plan, importance of weighing daily,  pt has not been weighing daily but is going to start, importance of calling provider early on for change in health status, symptom management Patient's sons are very involved in care, prefills med box weekly Interventions Today    Flowsheet Row Most Recent Value  Chronic Disease   Chronic disease during today's visit Congestive Heart Failure (CHF)  General Interventions   General Interventions Discussed/Reviewed General Interventions Discussed, Doctor Visits  Doctor Visits Discussed/Reviewed Doctor Visits Discussed, Doctor Visits Reviewed  Nutrition Interventions   Nutrition Discussed/Reviewed Nutrition Discussed  [low sodium diet]  Safety Interventions   Safety Discussed/Reviewed  Safety Discussed        Medications Reviewed Today: Medications Reviewed Today     Reviewed by Audrie Gallus, RN (Registered Nurse) on 04/26/23 at 1432  Med List Status: <None>   Medication Order Taking? Sig Documenting Provider Last Dose Status Informant  amitriptyline (ELAVIL) 50 MG tablet [id number] Yes Take 1 tablet (50 mg total) by mouth at bedtime. Lula Olszewski, MD Taking Active Multiple Informants  amLODipine (NORVASC) 5 MG tablet [id number] Yes Take 1 tablet (5 mg total) by mouth daily. Glade Lloyd, MD Taking Active   cyclobenzaprine (FLEXERIL) 10 MG tablet [id number] Yes Take 1 tablet (10 mg total) by mouth 3 (three) times daily as needed. Lula Olszewski, MD Taking Active Multiple Informants  hydrALAZINE (APRESOLINE) 50 MG tablet [id number] Yes Take 1 tablet (50 mg total) by mouth every 8 (eight) hours. Glade Lloyd, MD Taking Active   ipratropium-albuterol (DUONEB) 0.5-2.5 (3) MG/3ML SOLN [id number] Yes Take 3 mLs by nebulization every 4 (four) hours as needed. Lula Olszewski, MD Taking Active Multiple Informants           Med Note Merlene Morse, KIMBERLY L   Thu Apr 20, 2023  1:23 PM) No breathing treatments at home  isosorbide mononitrate (IMDUR) 60 MG 24 hr tablet [id number] Yes Take 1 tablet (60 mg total) by mouth daily. Glade Lloyd, MD Taking Active   Oxycodone HCl 10 MG TABS [id number] Yes Take 1 tablet (10 mg total) by mouth every 8 (eight) hours as needed. Lula Olszewski, MD Taking Active Multiple Informants  pantoprazole (PROTONIX) 40 MG tablet [id number] Yes TAKE 1 TABLET BY MOUTH EVERY DAY BEFORE BREAKFAST Lula Olszewski, MD Taking Active Multiple Informants  polyethylene glycol (MIRALAX / GLYCOLAX) 17 g packet [id number] No Take 17 g by mouth daily  as needed for moderate constipation.  Patient not taking: Reported on 04/20/2023   Glade Lloyd, MD Not Taking Active Child, Multiple Informants  Respiratory Therapy Supplies (NEBULIZER/TUBING/MOUTHPIECE) KIT  [id number] Yes 1 each by Does not apply route every 4 (four) hours as needed. Lula Olszewski, MD Taking Active Multiple Informants  Respiratory Therapy Supplies (NEBULIZER/TUBING/MOUTHPIECE) KIT [id number] Yes 1 each by Does not apply route every 4 (four) hours as needed. Lula Olszewski, MD Taking Active Multiple Informants  rosuvastatin (CRESTOR) 40 MG tablet [id number] Yes TAKE 1 TABLET BY MOUTH DAILY. REPLACES ATORVASTATIN (STOP ATORVASTATIN IF STILL TAKING) Lula Olszewski, MD Taking Active Multiple Informants  SUMAtriptan (IMITREX) 50 MG tablet [id number] No TAKE 1 TABLET (50 MG TOTAL) BY MOUTH DAILY. MAY REPEAT IN 2 HOURS IF HEADACHE PERSISTS OR RECURS.  Patient not taking: Reported on 04/20/2023   Lula Olszewski, MD Not Taking Active Child, Multiple Informants            Home Care and Equipment/Supplies: Were Home Health Services Ordered?: Yes Name of Home Health Agency:: [facility] Has Agency set up a time to come to your home?: Yes (son states home health called other brother to schedule home visit, there was a conflict and they will be calling Allen Norris to schedule,  [name] has contact number for follow up) Any new equipment or medical supplies ordered?: No (son states pt already had a rollator walker)  Functional Questionnaire: Do you need assistance with bathing/showering or dressing?: No Do you need assistance with meal preparation?: No Do you need assistance with eating?: No Do you have difficulty maintaining continence: No Do you need assistance with getting out of bed/getting out of a chair/moving?: Yes (cane) Do you have difficulty managing or taking your medications?: Yes (son provides oversight, prefills med box)  Follow up appointments reviewed: PCP Follow-up appointment confirmed?: Yes Date of PCP follow-up appointment?: 05/03/23 Follow-up Provider: [name] MD  @ 11 am Specialist Hospital Follow-up appointment confirmed?: Yes Date of  Specialist follow-up appointment?: 05/01/23 Follow-Up Specialty Provider:: [facility]  @ 1015 am Do you need transportation to your follow-up appointment?:  (sons provide transportation) Do you understand care options if your condition(s) worsen?: Yes-patient verbalized understanding  SDOH Interventions Today    Flowsheet Row Most Recent Value  SDOH Interventions   Food Insecurity Interventions Intervention Not Indicated  Housing Interventions Intervention Not Indicated  Transportation Interventions Intervention Not Indicated  Utilities Interventions Intervention Not Indicated       Irving Shows [facility], BSN RN Care Manager/ Transition of Care [place]/ [facility] Population Health [phone]

## 2023-04-27 ENCOUNTER — Telehealth: Payer: Self-pay | Admitting: Internal Medicine

## 2023-04-27 NOTE — Telephone Encounter (Signed)
 Please see note attach

## 2023-04-27 NOTE — Telephone Encounter (Unsigned)
 Copied from CRM [id number]. Topic: General - Other >> Apr 27, 2023  3:09 PM Eunice Blase wrote: Reason for CRM: Received call from [facility] per [place], ph: [phone] pt declined services.

## 2023-04-28 ENCOUNTER — Telehealth (HOSPITAL_COMMUNITY): Payer: Self-pay

## 2023-04-28 NOTE — Telephone Encounter (Signed)
 Called to confirm/remind patient of their appointment at the Advanced [facility] Failure Clinic on 05/01/2023 15:15.   Appointment:   [x] Confirmed  [] Left mess   [] No answer/No voice mail  [] Phone not in service  Patient reminded to bring all medications and/or complete list.  Confirmed patient has transportation. Gave directions, instructed to utilize valet parking.

## 2023-05-01 ENCOUNTER — Other Ambulatory Visit (HOSPITAL_COMMUNITY): Payer: Self-pay

## 2023-05-01 ENCOUNTER — Ambulatory Visit (HOSPITAL_COMMUNITY)
Admit: 2023-05-01 | Discharge: 2023-05-01 | Disposition: A | Discharge status: Home / Self Care | Source: Ambulatory Visit | Attending: Family Medicine

## 2023-05-01 ENCOUNTER — Encounter (HOSPITAL_COMMUNITY): Payer: Self-pay

## 2023-05-01 VITALS — BP 160/60 | HR 78 | Wt 147.6 lb

## 2023-05-01 DIAGNOSIS — I130 Hypertensive heart and chronic kidney disease with heart failure and stage 1 through stage 4 chronic kidney disease, or unspecified chronic kidney disease: Secondary | ICD-10-CM | POA: Insufficient documentation

## 2023-05-01 DIAGNOSIS — I5022 Chronic systolic (congestive) heart failure: Secondary | ICD-10-CM | POA: Diagnosis not present

## 2023-05-01 DIAGNOSIS — R9431 Abnormal electrocardiogram [ECG] [EKG]: Secondary | ICD-10-CM | POA: Diagnosis not present

## 2023-05-01 DIAGNOSIS — N183 Chronic kidney disease, stage 3 unspecified: Secondary | ICD-10-CM | POA: Insufficient documentation

## 2023-05-01 DIAGNOSIS — I5042 Chronic combined systolic (congestive) and diastolic (congestive) heart failure: Secondary | ICD-10-CM

## 2023-05-01 DIAGNOSIS — Z87891 Personal history of nicotine dependence: Secondary | ICD-10-CM | POA: Insufficient documentation

## 2023-05-01 DIAGNOSIS — J439 Emphysema, unspecified: Secondary | ICD-10-CM | POA: Diagnosis not present

## 2023-05-01 DIAGNOSIS — I5033 Acute on chronic diastolic (congestive) heart failure: Secondary | ICD-10-CM

## 2023-05-01 DIAGNOSIS — I10 Essential (primary) hypertension: Secondary | ICD-10-CM | POA: Diagnosis not present

## 2023-05-01 LAB — IRON AND TIBC
Iron: 42 ug/dL — ABNORMAL LOW (ref 45–182)
Saturation Ratios: 14 % — ABNORMAL LOW (ref 17.9–39.5)
TIBC: 309 ug/dL (ref 250–450)
UIBC: 267 ug/dL

## 2023-05-01 LAB — BASIC METABOLIC PANEL WITH GFR
Anion gap: 6 (ref 5–15)
BUN: 14 mg/dL (ref 8–23)
CO2: 25 mmol/L (ref 22–32)
Calcium: 8.5 mg/dL — ABNORMAL LOW (ref 8.9–10.3)
Chloride: 108 mmol/L (ref 98–111)
Creatinine, Ser: 1.3 mg/dL — ABNORMAL HIGH (ref 0.61–1.24)
GFR, Estimated: 56 mL/min — ABNORMAL LOW (ref >60)
Glucose, Bld: 123 mg/dL — ABNORMAL HIGH (ref 70–99)
Potassium: 3.6 mmol/L (ref 3.5–5.1)
Sodium: 139 mmol/L (ref 135–145)

## 2023-05-01 LAB — CBC
HCT: 28.2 % — ABNORMAL LOW (ref 39.0–52.0)
Hemoglobin: 9 g/dL — ABNORMAL LOW (ref 13.0–17.0)
MCH: 29.6 pg (ref 26.0–34.0)
MCHC: 31.9 g/dL (ref 30.0–36.0)
MCV: 92.8 fL (ref 80.0–100.0)
Platelets: 216 10*3/uL (ref 150–400)
RBC: 3.04 MIL/uL — ABNORMAL LOW (ref 4.22–5.81)
RDW: 13.7 % (ref 11.5–15.5)
WBC: 6 10*3/uL (ref 4.0–10.5)
nRBC: 0 % (ref 0.0–0.2)

## 2023-05-01 LAB — BRAIN NATRIURETIC PEPTIDE: B Natriuretic Peptide: 491.3 pg/mL — ABNORMAL HIGH (ref 0.0–100.0)

## 2023-05-01 LAB — FERRITIN: Ferritin: 92 ng/mL (ref 24–336)

## 2023-05-01 MED ORDER — LOSARTAN POTASSIUM 25 MG PO TABS: 25.0000 mg | ORAL_TABLET | Freq: Every day | ORAL | 3 refills | Status: DC

## 2023-05-01 NOTE — Progress Notes (Signed)
 ReDS Vest / Clip - 05/01/23 1000       ReDS Vest / Clip   Station Marker C    Ruler Value 29    ReDS Value Range Low volume    ReDS Actual Value 29

## 2023-05-01 NOTE — Patient Instructions (Signed)
 START Losartan 25 mg daily.  Labs done today, your results will be available in MyChart, we will contact you for abnormal readings.  Your physician has requested that you have a cardiac MRI. Cardiac MRI uses a computer to create images of your heart as its beating, producing both still and moving pictures of your heart and major blood vessels. For further information please visit [email or web address]. Please follow the instruction sheet given to you today for more information.  CHECK YOUR BLOOD PRESSURE DAILY.  Your physician recommends that you schedule a follow-up appointment in: 2 WEEKS.  If you have any questions, issues, or concerns before your next appointment please call our office at [phone], opt. 2 and leave a message for the triage nurse.

## 2023-05-03 ENCOUNTER — Encounter: Payer: Self-pay | Admitting: Internal Medicine

## 2023-05-03 ENCOUNTER — Ambulatory Visit (INDEPENDENT_AMBULATORY_CARE_PROVIDER_SITE_OTHER): Admitting: Internal Medicine

## 2023-05-03 VITALS — BP 110/72 | HR 75 | Temp 98.0°F | Ht 71.0 in | Wt 143.2 lb

## 2023-05-03 DIAGNOSIS — N183 Chronic kidney disease, stage 3 unspecified: Secondary | ICD-10-CM

## 2023-05-03 DIAGNOSIS — I5033 Acute on chronic diastolic (congestive) heart failure: Secondary | ICD-10-CM

## 2023-05-03 DIAGNOSIS — D649 Anemia, unspecified: Secondary | ICD-10-CM | POA: Diagnosis not present

## 2023-05-03 DIAGNOSIS — G8929 Other chronic pain: Secondary | ICD-10-CM

## 2023-05-03 DIAGNOSIS — E611 Iron deficiency: Secondary | ICD-10-CM

## 2023-05-03 DIAGNOSIS — K269 Duodenal ulcer, unspecified as acute or chronic, without hemorrhage or perforation: Secondary | ICD-10-CM

## 2023-05-03 DIAGNOSIS — M5442 Lumbago with sciatica, left side: Secondary | ICD-10-CM

## 2023-05-03 DIAGNOSIS — M5441 Lumbago with sciatica, right side: Secondary | ICD-10-CM

## 2023-05-03 MED ORDER — OXYCODONE-ACETAMINOPHEN 5-325 MG PO TABS: 1.0000 | ORAL_TABLET | ORAL | 0 refills | Status: DC | PRN

## 2023-05-03 MED ORDER — ACCRUFER 30 MG PO CAPS: 1.0000 | ORAL_CAPSULE | Freq: Two times a day (BID) | ORAL | 3 refills | Status: DC

## 2023-05-03 MED ORDER — PANTOPRAZOLE SODIUM 40 MG PO TBEC: 40.0000 mg | DELAYED_RELEASE_TABLET | Freq: Every day | ORAL | 3 refills | Status: AC

## 2023-05-04 ENCOUNTER — Encounter: Payer: Self-pay | Admitting: Internal Medicine

## 2023-05-04 NOTE — Patient Instructions (Addendum)
 YOUR FOLLOW-UP CARE INSTRUCTIONS   Thank you for coming in today. Here's a summary of your visit and important instructions: CHANGES TO YOUR MEDICATIONS   New Medications  Accrufer (iron supplement): Take one capsule twice daily  Protonix 40mg : Take one tablet daily  Percocet 5/325mg : Take one tablet every 4 hours as needed for severe pain   Continue Continue all other current medications exactly as prescribed  DAILY MONITORING:  Weigh yourself every morning before breakfast Keep a log of your weights Record any symptoms of shortness of breath or swelling DIETARY INSTRUCTIONS:  Follow a low-salt diet (less than 2 grams of sodium per day) Avoid adding salt to food Check food labels for sodium content Take iron supplements with citrus juice to improve absorption CALL us IMMEDIATELY OR GO TO THE ER IF YOU HAVE: Weight gain of 2 pounds or more in one day Increased shortness of breath at rest Chest pain not relieved by rest Black or bloody stools Severe dizziness or fainting     FOLLOW-UP APPOINTMENTS: ??? Return to clinic in 2 weeks ???? Hematology consultation - our office will help schedule ?? Laboratory tests needed in 4 weeks    Please call our office at if you have any questions or concerns.

## 2023-05-04 NOTE — Assessment & Plan Note (Signed)
 Progressive decline in hemoglobin since September 2024 Iron studies confirm iron deficiency History of GI bleed in 2021, currently no active bleeding symptoms Contributing to heart failure symptoms Plan: Start Accrufer (ferric maltol) 30mg  twice daily Restart pantoprazole 40mg  daily for GI protection Refer to hematology for evaluation due to macrocytosis doesn't fit for gastrointestinal bleeding. CBC monitoring in 4 weeks Consider GI evaluation if no improvement

## 2023-05-04 NOTE — Assessment & Plan Note (Signed)
 Recently hospitalized for decompensation due to dietary indiscretion Currently euvolemic with improved symptoms Echo shows global hypokinesis, mild pulmonary hypertension BNP improved but remains elevated at 491 Plan: Continue current heart failure regimen including hydralazine and isosorbide Strict sodium restriction counseling provided Daily weight monitoring Follow up with cardiology within 2 weeks Monitor renal function and electrolytes closely

## 2023-05-04 NOTE — Assessment & Plan Note (Signed)
 Currently on oxycodone 10mg  every 8 hours 90tabs monthly - has shifted back to me prescribing from pain clinic. Contributing to constipation Plan: Transition to oxycodone-acetaminophen 5-325mg  q4h PRN Continue current muscle relaxants Emphasize non-pharmacologic pain management Follow up with pain management as scheduled

## 2023-05-04 NOTE — Progress Notes (Signed)
 ==============================  [facility]: [phone]   -- Medical Office Visit --  Patient: Michael Bender      Age: 78 y.o.       Sex:  male  Date:   05/03/2023 Today's Healthcare Provider: Lula Olszewski, MD  ==============================   Chief Complaint: Hospitalization Follow-up (Pt states was in hospital for three days. States doing better.)   History of Present Illness The patient is a 78 year old male with heart failure who presents for follow-up after a recent three-day hospitalization for fluid retention and anemia. He reports improvement in his symptoms since discharge. The recent hospitalization was attributed to excessive salt intake contributing to fluid retention. After receiving diuretic therapy during hospitalization, his breathing has improved significantly, allowing him to resume activities like fishing without significant shortness of breath.  The patient has a persistent anemia, with current hemoglobin of 9.0 g/dL, down from 14.7 in April 2024. He experienced a significant gastrointestinal bleed in June 2021 requiring hospitalization, after which his blood counts gradually recovered. However, since September 2024, his hemoglobin levels have been declining again. He denies current gastrointestinal bleeding symptoms such as melena or hematochezia. Iron infusion has been recommended based on recent blood work showing iron deficiency.  His current medications include amitriptyline 50 mg daily, cyclobenzaprine 10 mg three times daily, amlodipine 5 mg daily, hydralazine 50 mg every eight hours, isosorbide mononitrate 60 mg daily, and oxycodone 10 mg every eight hours. He is not currently taking calcium supplements or iron supplementation. He discontinued Protonix, which was previously prescribed for ulcer prevention. His son assists with medical care coordination and medication management.  Note that while he received diuretics in the  hospital he hasn't had any since discharge and reports breathing status is good now  Recent hospital workup revealed an ejection fraction of 40-45% with global hypokinesis, mildly elevated pulmonary artery pressures, and bilateral pleural effusions. Labs show stable renal function with creatinine 1.30 and improving electrolytes. BNP remains elevated at 491.   Background: Reviewed: He has Pulmonary emphysema (HCC); GERD; CONSTIPATION, CHRONIC; DISC DISEASE, LUMBAR; Primary hypertension; Bilateral leg pain; Esophageal dysphagia; Insomnia; PAD (peripheral artery disease) (HCC); History of CVA (cerebrovascular accident); Anemia; HLD (hyperlipidemia); COPD (chronic obstructive pulmonary disease) (HCC); Iron deficiency anemia; Duodenal stenosis; Chronic narcotic dependence (HCC); Atherosclerosis of aorta (HCC); History of fusion of cervical spine; History of lumbar fusion; High risk medication use; Gynecomastia, male; Cachexia (HCC); Underweight on examination; Chronic low back pain with bilateral sciatica; AKI (acute kidney injury) (HCC); Neck pain; Cervical discitis; Memory loss; Frequent falls; Nonintractable headache; Balance problems; Congestive heart failure with left ventricular diastolic dysfunction, acute on chronic (HCC); and Orthostatic hypotension on their problem list.  Reviewed: He   has a past medical history of Acute combined systolic and diastolic heart failure (HCC) (82/95/6213), Acute on chronic diastolic CHF (congestive heart failure) (HCC) (04/20/2023), Arthritis, Balance problems (02/21/2023), Blood transfusion, Chronic back pain, COPD (chronic obstructive pulmonary disease) (HCC), Disturbance of skin sensation (05/22/2018), Effusion of right knee (02/21/2023), Effusion of right knee joint (01/16/2023), Elevated troponin (04/22/2023), Essential hypertension (03/05/2007), Finger pain, left (11/08/2022), Gastric erosion, Gastritis and gastroduodenitis, Gastrointestinal hemorrhage with melena  (11/20/2018), GIB (gastrointestinal bleeding) (07/18/2019), Hardware complicating wound infection (HCC) (11/10/2022), Headache(784.0), Hemorrhoids, internal, History of duodenal ulcer, History of lumbar fusion (03/03/2022), History of upper gastrointestinal bleeding (03/03/2022), HLD (hyperlipidemia), Hypertension, Hypokalemia (04/22/2023), Hypomagnesemia (04/22/2023), Infected blister of left index finger (11/07/2022), Intractable pain (03/03/2022), Loss of weight, MSSA bacteremia (11/07/2022), Neck rigidity, Nocturia,  PAIN, CHRONIC NEC (10/06/2006), Pneumonia, Prostate disease, Right sided temporal headache (05/22/2018), S/P cervical spinal fusion (03/03/2022), Senile ecchymosis (01/21/2019), Septic arthritis of wrist, left (HCC) (11/08/2022), Septic infrapatellar bursitis of right knee (11/07/2022), Staphylococcal arthritis of left wrist (HCC) (11/07/2022), Staphylococcal arthritis of right knee (HCC) (11/08/2022), and Syncope (11/05/2022).  Manually updated:  Problem  Acute Combined Systolic and Diastolic Heart Failure (Hcc) (Resolved)  Elevated Troponin (Resolved)  Hypokalemia (Resolved)  Hypomagnesemia (Resolved)  Acute On Chronic Diastolic Chf (Congestive Heart Failure) (Hcc) (Resolved)  Effusion of Right Knee (Resolved)  Effusion of Right Knee Joint (Resolved)  Hardware Complicating Wound Infection (Hcc) (Resolved)  Staphylococcal Arthritis of Right Knee (Hcc) (Resolved)  Finger Pain, Left (Resolved)  Septic Arthritis of Wrist, Left (Hcc) (Resolved)  Mssa Bacteremia (Resolved)  Septic Infrapatellar Bursitis of Right Knee (Resolved)   Staphylococcal Arthritis of Right Knee (Updated 01/30/2023) MRI 01/17/2023 shows worsening findings:  Worsening tear of posterior horn medial meniscus with large radial component Worsening subcortical stress fracture/osteochondral lesion of medial femoral condyle New subcortical stress fracture/osteochondral lesion of medial tibial  plateau Moderate-to-large effusion with worsened synovitis 0.8cm osteochondral fragment Moderate-to-severe tricompartmental osteoarthritis Despite IV antibiotics and ongoing oral antibiotics, showing clinical worsening with increasing pain and swelling. Now with concern for either:   Undetected resistant/rare organism (possibly related to prior fish hook injury) Inadequate source control requiring surgical intervention Pending urgent orthopedic evaluation with Dr Lequita Halt   Staphylococcal Arthritis of Left Wrist (Hcc) (Resolved)  Infected Blister of Left Index Finger (Resolved)  Syncope (Resolved)  Intractable Pain (Resolved)  Senile Ecchymosis (Resolved)  Right Sided Temporal Headache (Resolved)    Reviewed:  Allergies as of 05/03/2023 - Review Complete 05/03/2023  Allergen Reaction Noted   Nubain [nalbuphine hcl]  03/01/2011    Medications: Reviewed: Current Outpatient Medications on File Prior to Visit  Medication Sig   amitriptyline (ELAVIL) 50 MG tablet Take 1 tablet (50 mg total) by mouth at bedtime.   amLODipine (NORVASC) 5 MG tablet Take 1 tablet (5 mg total) by mouth daily.   cyclobenzaprine (FLEXERIL) 10 MG tablet Take 1 tablet (10 mg total) by mouth 3 (three) times daily as needed.   hydrALAZINE (APRESOLINE) 50 MG tablet Take 1 tablet (50 mg total) by mouth every 8 (eight) hours.   ipratropium-albuterol (DUONEB) 0.5-2.5 (3) MG/3ML SOLN Take 3 mLs by nebulization every 4 (four) hours as needed.   isosorbide mononitrate (IMDUR) 60 MG 24 hr tablet Take 1 tablet (60 mg total) by mouth daily.   Oxycodone HCl 10 MG TABS Take 1 tablet (10 mg total) by mouth every 8 (eight) hours as needed.   polyethylene glycol (MIRALAX / GLYCOLAX) 17 g packet Take 17 g by mouth daily as needed for moderate constipation.   Respiratory Therapy Supplies (NEBULIZER/TUBING/MOUTHPIECE) KIT 1 each by Does not apply route every 4 (four) hours as needed.   Respiratory Therapy Supplies  (NEBULIZER/TUBING/MOUTHPIECE) KIT 1 each by Does not apply route every 4 (four) hours as needed.   rosuvastatin (CRESTOR) 40 MG tablet TAKE 1 TABLET BY MOUTH DAILY. REPLACES ATORVASTATIN (STOP ATORVASTATIN IF STILL TAKING)   losartan (COZAAR) 25 MG tablet Take 1 tablet (25 mg total) by mouth daily. (Patient not taking: Reported on 05/03/2023)   SUMAtriptan (IMITREX) 50 MG tablet TAKE 1 TABLET (50 MG TOTAL) BY MOUTH DAILY. MAY REPEAT IN 2 HOURS IF HEADACHE PERSISTS OR RECURS. (Patient not taking: Reported on 05/03/2023)   No current facility-administered medications on file prior to visit.   Medications Discontinued During This  Encounter  Medication Reason   pantoprazole (PROTONIX) 40 MG tablet Reorder       Physical Exam:    05/03/2023   11:00 AM 05/01/2023   10:16 AM 04/24/2023    3:02 PM  Vitals with BMI  Height 5\' 11"     Weight 143 lbs 3 oz 147 lbs 10 oz   BMI 19.98    Systolic 110 160 161  Diastolic 72 60 53  Pulse 75 78 79   Wt Readings from Last 10 Encounters:  05/03/23 143 lb 3.2 oz (65 kg)  05/01/23 147 lb 9.6 oz (67 kg)  04/24/23 136 lb 14.5 oz (62.1 kg)  04/20/23 153 lb 12.8 oz (69.8 kg)  03/22/23 143 lb (64.9 kg)  02/22/23 146 lb (66.2 kg)  02/17/23 146 lb (66.2 kg)  02/17/23 146 lb 3.2 oz (66.3 kg)  02/08/23 147 lb (66.7 kg)  01/19/23 147 lb (66.7 kg)  Vital signs reviewed.  Nursing notes reviewed. Weight trend reviewed. Physical Exam  Physical Exam NEUROLOGICAL: Cranial nerves grossly intact, symmetrical eyebrow raise. Although initially right eyebrow looks lower.   General Appearance:  No acute distress appreciable.   Well-groomed, healthy-appearing male.  Well proportioned with no abnormal fat distribution.  Good muscle tone. Pulmonary:  Normal work of breathing at rest, no respiratory distress apparent. SpO2: 98 %  Musculoskeletal: All extremities are intact.  Neurological:  Awake, alert, oriented, and engaged.  No obvious focal neurological deficits or cognitive  impairments.  Sensorium seems unclouded.   Speech is clear and coherent with logical content. Psychiatric:  Appropriate mood, pleasant and cooperative demeanor, thoughtful and engaged during the exam    No results found for any visits on 05/03/23. Hospital Outpatient Visit on 05/01/2023  Component Date Value   Sodium 05/01/2023 139    Potassium 05/01/2023 3.6    Chloride 05/01/2023 108    CO2 05/01/2023 25    Glucose, Bld 05/01/2023 123 (H)    BUN 05/01/2023 14    Creatinine, Ser 05/01/2023 1.30 (H)    Calcium 05/01/2023 8.5 (L)    GFR, Estimated 05/01/2023 56 (L)    Anion gap 05/01/2023 6    B Natriuretic Peptide 05/01/2023 491.3 (H)    WBC 05/01/2023 6.0    RBC 05/01/2023 3.04 (L)    Hemoglobin 05/01/2023 9.0 (L)    HCT 05/01/2023 28.2 (L)    MCV 05/01/2023 92.8    MCH 05/01/2023 29.6    MCHC 05/01/2023 31.9    RDW 05/01/2023 13.7    Platelets 05/01/2023 216    nRBC 05/01/2023 0.0    Iron 05/01/2023 42 (L)    TIBC 05/01/2023 309    Saturation Ratios 05/01/2023 14 (L)    UIBC 05/01/2023 267    Ferritin 05/01/2023 92   Admission on 04/20/2023, Discharged on 04/24/2023  Component Date Value   WBC 04/20/2023 6.6    RBC 04/20/2023 2.89 (L)    Hemoglobin 04/20/2023 8.7 (L)    HCT 04/20/2023 26.3 (L)    MCV 04/20/2023 91.0    MCH 04/20/2023 30.1    MCHC 04/20/2023 33.1    RDW 04/20/2023 14.2    Platelets 04/20/2023 224    nRBC 04/20/2023 0.0    Neutrophils Relative % 04/20/2023 77    Neutro Abs 04/20/2023 5.1    Lymphocytes Relative 04/20/2023 12    Lymphs Abs 04/20/2023 0.8    Monocytes Relative 04/20/2023 8    Monocytes Absolute 04/20/2023 0.5    Eosinophils Relative 04/20/2023 2  Eosinophils Absolute 04/20/2023 0.2    Basophils Relative 04/20/2023 1    Basophils Absolute 04/20/2023 0.0    Immature Granulocytes 04/20/2023 0    Abs Immature Granulocytes 04/20/2023 0.02    Sodium 04/20/2023 137    Potassium 04/20/2023 2.8 (L)    Chloride 04/20/2023 102    CO2  04/20/2023 27    Glucose, Bld 04/20/2023 135 (H)    BUN 04/20/2023 17    Creatinine, Ser 04/20/2023 1.39 (H)    Calcium 04/20/2023 8.1 (L)    GFR, Estimated 04/20/2023 52 (L)    Anion gap 04/20/2023 8    B Natriuretic Peptide 04/20/2023 2,352.1 (H)    Troponin I (High Sensiti* 04/20/2023 79 (H)    SARS Coronavirus 2 by RT* 04/20/2023 NEGATIVE    Influenza A by PCR 04/20/2023 NEGATIVE    Influenza B by PCR 04/20/2023 NEGATIVE    Resp Syncytial Virus by * 04/20/2023 NEGATIVE    Magnesium 04/20/2023 1.9    Troponin I (High Sensiti* 04/20/2023 72 (H)    Sodium 04/21/2023 140    Potassium 04/21/2023 3.3 (L)    Chloride 04/21/2023 105    CO2 04/21/2023 27    Glucose, Bld 04/21/2023 111 (H)    BUN 04/21/2023 17    Creatinine, Ser 04/21/2023 1.56 (H)    Calcium 04/21/2023 8.2 (L)    Total Protein 04/21/2023 6.0 (L)    Albumin 04/21/2023 2.6 (L)    AST 04/21/2023 19    ALT 04/21/2023 7    Alkaline Phosphatase 04/21/2023 84    Total Bilirubin 04/21/2023 0.5    GFR, Estimated 04/21/2023 45 (L)    Anion gap 04/21/2023 8    WBC 04/21/2023 5.8    RBC 04/21/2023 2.92 (L)    Hemoglobin 04/21/2023 8.7 (L)    HCT 04/21/2023 26.6 (L)    MCV 04/21/2023 91.1    MCH 04/21/2023 29.8    MCHC 04/21/2023 32.7    RDW 04/21/2023 14.3    Platelets 04/21/2023 225    nRBC 04/21/2023 0.0    Magnesium 04/21/2023 1.9    Weight 04/21/2023 2,303.37    Height 04/21/2023 71    BP 04/21/2023 168/64    Single Plane A2C EF 04/21/2023 36.9    Single Plane A4C EF 04/21/2023 37.7    Calc EF 04/21/2023 36.1    S' Lateral 04/21/2023 4.00    AR max vel 04/21/2023 2.15    AV Area VTI 04/21/2023 2.32    AV Mean grad 04/21/2023 4.0    AV Peak grad 04/21/2023 7.2    Ao pk vel 04/21/2023 1.34    Area-P 1/2 04/21/2023 4.96    AV Area mean vel 04/21/2023 2.02    MV VTI 04/21/2023 2.63    Est EF 04/21/2023 40 - 45%    Sodium 04/22/2023 140    Potassium 04/22/2023 3.2 (L)    Chloride 04/22/2023 101    CO2  04/22/2023 28    Glucose, Bld 04/22/2023 115 (H)    BUN 04/22/2023 19    Creatinine, Ser 04/22/2023 1.72 (H)    Calcium 04/22/2023 8.6 (L)    GFR, Estimated 04/22/2023 40 (L)    Anion gap 04/22/2023 11    Magnesium 04/22/2023 1.8    Sodium 04/23/2023 139    Potassium 04/23/2023 4.1    Chloride 04/23/2023 104    CO2 04/23/2023 27    Glucose, Bld 04/23/2023 139 (H)    BUN 04/23/2023 25 (H)    Creatinine, Ser 04/23/2023 1.72 (H)  Calcium 04/23/2023 8.5 (L)    GFR, Estimated 04/23/2023 40 (L)    Anion gap 04/23/2023 8    Magnesium 04/23/2023 1.9    Sodium 04/24/2023 138    Potassium 04/24/2023 3.9    Chloride 04/24/2023 104    CO2 04/24/2023 26    Glucose, Bld 04/24/2023 107 (H)    BUN 04/24/2023 21    Creatinine, Ser 04/24/2023 1.77 (H)    Calcium 04/24/2023 8.3 (L)    GFR, Estimated 04/24/2023 39 (L)    Anion gap 04/24/2023 8    Magnesium 04/24/2023 2.0   Office Visit on 02/22/2023  Component Date Value   Sed Rate 02/22/2023 19    CRP 02/22/2023 11.6 (H)    WBC 02/22/2023 6.1    RBC 02/22/2023 3.05 (L)    Hemoglobin 02/22/2023 9.4 (L)    HCT 02/22/2023 29.0 (L)    MCV 02/22/2023 95.1    MCH 02/22/2023 30.8    MCHC 02/22/2023 32.4    RDW 02/22/2023 12.4    Platelets 02/22/2023 166    MPV 02/22/2023 9.8    Neutro Abs 02/22/2023 3,239    Absolute Lymphocytes 02/22/2023 2,129    Absolute Monocytes 02/22/2023 561    Eosinophils Absolute 02/22/2023 153    Basophils Absolute 02/22/2023 18    Neutrophils Relative % 02/22/2023 53.1    Total Lymphocyte 02/22/2023 34.9    Monocytes Relative 02/22/2023 9.2    Eosinophils Relative 02/22/2023 2.5    Basophils Relative 02/22/2023 0.3    Glucose, Bld 02/22/2023 94    BUN 02/22/2023 20    Creat 02/22/2023 1.77 (H)    eGFR 02/22/2023 39 (L)    BUN/Creatinine Ratio 02/22/2023 11    Sodium 02/22/2023 140    Potassium 02/22/2023 3.1 (L)    Chloride 02/22/2023 104    CO2 02/22/2023 26    Calcium 02/22/2023 8.4 (L)    Total  Protein 02/22/2023 6.3    Albumin 02/22/2023 3.4 (L)    Globulin 02/22/2023 2.9    AG Ratio 02/22/2023 1.2    Total Bilirubin 02/22/2023 0.3    Alkaline phosphatase (AP* 02/22/2023 92    AST 02/22/2023 21    ALT 02/22/2023 6 (L)   Admission on 02/17/2023, Discharged on 02/17/2023  Component Date Value   Color, Urine 02/17/2023 YELLOW    APPearance 02/17/2023 CLEAR    Specific Gravity, Urine 02/17/2023 1.011    pH 02/17/2023 5.0    Glucose, UA 02/17/2023 NEGATIVE    Hgb urine dipstick 02/17/2023 MODERATE (A)    Bilirubin Urine 02/17/2023 NEGATIVE    Ketones, ur 02/17/2023 NEGATIVE    Protein, ur 02/17/2023 30 (A)    Nitrite 02/17/2023 NEGATIVE    Leukocytes,Ua 02/17/2023 NEGATIVE    RBC / HPF 02/17/2023 0-5    WBC, UA 02/17/2023 0-5    Bacteria, UA 02/17/2023 NONE SEEN    Squamous Epithelial / HPF 02/17/2023 0-5    Mucus 02/17/2023 PRESENT    Glucose-Capillary 02/17/2023 164 (H)    WBC 02/17/2023 5.0    RBC 02/17/2023 2.85 (L)    Hemoglobin 02/17/2023 8.9 (L)    HCT 02/17/2023 27.3 (L)    MCV 02/17/2023 95.8    MCH 02/17/2023 31.2    MCHC 02/17/2023 32.6    RDW 02/17/2023 13.2    Platelets 02/17/2023 141 (L)    nRBC 02/17/2023 0.0    Neutrophils Relative % 02/17/2023 65    Neutro Abs 02/17/2023 3.2    Lymphocytes Relative 02/17/2023 24  Lymphs Abs 02/17/2023 1.2    Monocytes Relative 02/17/2023 10    Monocytes Absolute 02/17/2023 0.5    Eosinophils Relative 02/17/2023 1    Eosinophils Absolute 02/17/2023 0.1    Basophils Relative 02/17/2023 0    Basophils Absolute 02/17/2023 0.0    Immature Granulocytes 02/17/2023 0    Abs Immature Granulocytes 02/17/2023 0.01    Sodium 02/17/2023 139    Potassium 02/17/2023 3.6    Chloride 02/17/2023 105    CO2 02/17/2023 23    Glucose, Bld 02/17/2023 188 (H)    BUN 02/17/2023 26 (H)    Creatinine, Ser 02/17/2023 2.10 (H)    Calcium 02/17/2023 8.5 (L)    Total Protein 02/17/2023 6.3 (L)    Albumin 02/17/2023 3.1 (L)    AST  02/17/2023 32    ALT 02/17/2023 11    Alkaline Phosphatase 02/17/2023 79    Total Bilirubin 02/17/2023 0.5    GFR, Estimated 02/17/2023 32 (L)    Anion gap 02/17/2023 11    Sed Rate 02/17/2023 18 (H)    CRP 02/17/2023 0.8   Office Visit on 01/17/2023  Component Date Value   CRP 01/17/2023 5.6    Sed Rate 01/17/2023 36 (H)    Glucose, Bld 01/17/2023 167 (H)    BUN 01/17/2023 21    Creat 01/17/2023 1.71 (H)    eGFR 01/17/2023 40 (L)    BUN/Creatinine Ratio 01/17/2023 12    Sodium 01/17/2023 141    Potassium 01/17/2023 4.7    Chloride 01/17/2023 107    CO2 01/17/2023 26    Calcium 01/17/2023 8.7    Total Protein 01/17/2023 6.6    Albumin 01/17/2023 3.7    Globulin 01/17/2023 2.9    AG Ratio 01/17/2023 1.3    Total Bilirubin 01/17/2023 0.4    Alkaline phosphatase (AP* 01/17/2023 106    AST 01/17/2023 19    ALT 01/17/2023 6 (L)    WBC 01/17/2023 5.6    RBC 01/17/2023 2.69 (L)    Hemoglobin 01/17/2023 8.3 (L)    HCT 01/17/2023 26.1 (L)    MCV 01/17/2023 97.0    MCH 01/17/2023 30.9    MCHC 01/17/2023 31.8 (L)    RDW 01/17/2023 13.1    Platelets 01/17/2023 181    MPV 01/17/2023 10.4    Neutro Abs 01/17/2023 2,638    Absolute Lymphocytes 01/17/2023 2,050    Absolute Monocytes 01/17/2023 521    Eosinophils Absolute 01/17/2023 353    Basophils Absolute 01/17/2023 39    Neutrophils Relative % 01/17/2023 47.1    Total Lymphocyte 01/17/2023 36.6    Monocytes Relative 01/17/2023 9.3    Eosinophils Relative 01/17/2023 6.3    Basophils Relative 01/17/2023 0.7   Lab on 01/02/2023  Component Date Value   WBC 01/02/2023 6.1    RBC 01/02/2023 2.56 (L)    Hemoglobin 01/02/2023 7.9 (L)    HCT 01/02/2023 25.2 (L)    MCV 01/02/2023 98.4    MCH 01/02/2023 30.9    MCHC 01/02/2023 31.3    RDW 01/02/2023 14.1    Platelets 01/02/2023 214    nRBC 01/02/2023 0.0    Neutrophils Relative % 01/02/2023 58    Neutro Abs 01/02/2023 3.5    Lymphocytes Relative 01/02/2023 29    Lymphs Abs  01/02/2023 1.7    Monocytes Relative 01/02/2023 10    Monocytes Absolute 01/02/2023 0.6    Eosinophils Relative 01/02/2023 3    Eosinophils Absolute 01/02/2023 0.2    Basophils Relative 01/02/2023 0  Basophils Absolute 01/02/2023 0.0    Immature Granulocytes 01/02/2023 0    Abs Immature Granulocytes 01/02/2023 0.02   Office Visit on 12/28/2022  Component Date Value   CRP 12/28/2022 32.2 (H)    Sed Rate 12/28/2022 67 (H)    Glucose, Bld 12/28/2022 135 (H)    BUN 12/28/2022 27 (H)    Creat 12/28/2022 1.79 (H)    eGFR 12/28/2022 38 (L)    BUN/Creatinine Ratio 12/28/2022 15    Sodium 12/28/2022 139    Potassium 12/28/2022 5.1    Chloride 12/28/2022 113 (H)    CO2 12/28/2022 18 (L)    Calcium 12/28/2022 8.5 (L)    Total Protein 12/28/2022 6.7    Albumin 12/28/2022 3.5 (L)    Globulin 12/28/2022 3.2    AG Ratio 12/28/2022 1.1    Total Bilirubin 12/28/2022 0.2    Alkaline phosphatase (AP* 12/28/2022 118    AST 12/28/2022 12    ALT 12/28/2022 <3 (L)    WBC 12/28/2022 5.6    RBC 12/28/2022 2.30 (L)    Hemoglobin 12/28/2022 7.1 (L)    HCT 12/28/2022 22.2 (L)    MCV 12/28/2022 96.5    MCH 12/28/2022 30.9    MCHC 12/28/2022 32.0    RDW 12/28/2022 13.1    Platelets 12/28/2022 205    MPV 12/28/2022 9.4    Neutro Abs 12/28/2022 3,875    Absolute Lymphocytes 12/28/2022 1,042    Absolute Monocytes 12/28/2022 465    Eosinophils Absolute 12/28/2022 196    Basophils Absolute 12/28/2022 22    Neutrophils Relative % 12/28/2022 69.2    Total Lymphocyte 12/28/2022 18.6    Monocytes Relative 12/28/2022 8.3    Eosinophils Relative 12/28/2022 3.5    Basophils Relative 12/28/2022 0.4   No results displayed because visit has over 200 results.    Admission on 10/27/2022, Discharged on 11/02/2022  Component Date Value   WBC 10/27/2022 5.4    RBC 10/27/2022 3.37 (L)    Hemoglobin 10/27/2022 10.5 (L)    HCT 10/27/2022 30.7 (L)    MCV 10/27/2022 91.1    MCH 10/27/2022 31.2    MCHC  10/27/2022 34.2    RDW 10/27/2022 13.2    Platelets 10/27/2022 133 (L)    nRBC 10/27/2022 0.0    Color, Urine 10/27/2022 YELLOW    APPearance 10/27/2022 CLEAR    Specific Gravity, Urine 10/27/2022 1.010    pH 10/27/2022 6.0    Glucose, UA 10/27/2022 NEGATIVE    Hgb urine dipstick 10/27/2022 LARGE (A)    Bilirubin Urine 10/27/2022 NEGATIVE    Ketones, ur 10/27/2022 NEGATIVE    Protein, ur 10/27/2022 100 (A)    Nitrite 10/27/2022 NEGATIVE    Leukocytes,Ua 10/27/2022 NEGATIVE    Sodium 10/27/2022 134 (L)    Potassium 10/27/2022 2.1 (LL)    Chloride 10/27/2022 99    CO2 10/27/2022 24    Glucose, Bld 10/27/2022 126 (H)    BUN 10/27/2022 26 (H)    Creatinine, Ser 10/27/2022 4.21 (H)    Calcium 10/27/2022 7.4 (L)    Total Protein 10/27/2022 6.5    Albumin 10/27/2022 2.9 (L)    AST 10/27/2022 126 (H)    ALT 10/27/2022 49 (H)    Alkaline Phosphatase 10/27/2022 153 (H)    Total Bilirubin 10/27/2022 0.2 (L)    GFR, Estimated 10/27/2022 14 (L)    Anion gap 10/27/2022 11    Magnesium 10/27/2022 1.9    RBC / HPF 10/27/2022 11-20    WBC, UA  10/27/2022 0-5    Bacteria, UA 10/27/2022 NONE SEEN    Squamous Epithelial / HPF 10/27/2022 0-5    Total CK 10/27/2022 2,828 (H)    Sodium 10/28/2022 138    Potassium 10/28/2022 2.6 (LL)    Chloride 10/28/2022 104    CO2 10/28/2022 20 (L)    Glucose, Bld 10/28/2022 82    BUN 10/28/2022 25 (H)    Creatinine, Ser 10/28/2022 3.62 (H)    Calcium 10/28/2022 7.6 (L)    GFR, Estimated 10/28/2022 17 (L)    Anion gap 10/28/2022 14    Magnesium 10/28/2022 2.5 (H)    Phosphorus 10/28/2022 3.7    Total CK 10/28/2022 2,043 (H)    Total Protein 10/28/2022 5.5 (L)    Albumin 10/28/2022 2.4 (L)    AST 10/28/2022 95 (H)    ALT 10/28/2022 39    Alkaline Phosphatase 10/28/2022 134 (H)    Total Bilirubin 10/28/2022 0.5    Bilirubin, Direct 10/28/2022 0.2    Indirect Bilirubin 10/28/2022 0.3    Vitamin B-12 10/28/2022 676    Sodium 10/29/2022 142     Potassium 10/29/2022 3.2 (L)    Chloride 10/29/2022 106    CO2 10/29/2022 24    Glucose, Bld 10/29/2022 147 (H)    BUN 10/29/2022 24 (H)    Creatinine, Ser 10/29/2022 3.19 (H)    Calcium 10/29/2022 7.8 (L)    GFR, Estimated 10/29/2022 19 (L)    Anion gap 10/29/2022 12    Total CK 10/29/2022 992 (H)    Folate 10/30/2022 13.1    Iron 10/30/2022 46    TIBC 10/30/2022 223 (L)    Saturation Ratios 10/30/2022 21    UIBC 10/30/2022 177    Ferritin 10/30/2022 224    Retic Ct Pct 10/30/2022 0.6    RBC. 10/30/2022 3.41 (L)    Retic Count, Absolute 10/30/2022 19.8    Immature Retic Fract 10/30/2022 1.8 (L)    Sodium 10/30/2022 143    Potassium 10/30/2022 3.6    Chloride 10/30/2022 101    CO2 10/30/2022 27    Glucose, Bld 10/30/2022 183 (H)    BUN 10/30/2022 17    Creatinine, Ser 10/30/2022 2.28 (H)    Calcium 10/30/2022 8.2 (L)    GFR, Estimated 10/30/2022 29 (L)    Anion gap 10/30/2022 15    Total CK 10/30/2022 802 (H)    WBC 10/31/2022 4.5    RBC 10/31/2022 3.53 (L)    Hemoglobin 10/31/2022 11.3 (L)    HCT 10/31/2022 32.5 (L)    MCV 10/31/2022 92.1    MCH 10/31/2022 32.0    MCHC 10/31/2022 34.8    RDW 10/31/2022 13.1    Platelets 10/31/2022 144 (L)    nRBC 10/31/2022 0.0    Sodium 10/31/2022 140    Potassium 10/31/2022 3.6    Chloride 10/31/2022 104    CO2 10/31/2022 28    Glucose, Bld 10/31/2022 124 (H)    BUN 10/31/2022 13    Creatinine, Ser 10/31/2022 2.13 (H)    Calcium 10/31/2022 8.1 (L)    Total Protein 10/31/2022 5.8 (L)    Albumin 10/31/2022 2.5 (L)    AST 10/31/2022 114 (H)    ALT 10/31/2022 37    Alkaline Phosphatase 10/31/2022 130 (H)    Total Bilirubin 10/31/2022 0.8    GFR, Estimated 10/31/2022 31 (L)    Anion gap 10/31/2022 8    Total CK 10/31/2022 818 (H)    Total CK 11/01/2022 482 (H)  Sodium 11/01/2022 139    Potassium 11/01/2022 2.6 (LL)    Chloride 11/01/2022 98    CO2 11/01/2022 28    Glucose, Bld 11/01/2022 128 (H)    BUN 11/01/2022 11     Creatinine, Ser 11/01/2022 1.65 (H)    Calcium 11/01/2022 8.0 (L)    Total Protein 11/01/2022 5.6 (L)    Albumin 11/01/2022 2.4 (L)    AST 11/01/2022 146 (H)    ALT 11/01/2022 43    Alkaline Phosphatase 11/01/2022 121    Total Bilirubin 11/01/2022 0.2 (L)    GFR, Estimated 11/01/2022 43 (L)    Anion gap 11/01/2022 13    Magnesium 11/01/2022 1.3 (L)    Potassium 11/01/2022 2.7 (LL)    Magnesium 11/01/2022 2.4    Potassium 11/01/2022 3.6    Sodium 11/02/2022 138    Potassium 11/02/2022 3.1 (L)    Chloride 11/02/2022 104    CO2 11/02/2022 25    Glucose, Bld 11/02/2022 195 (H)    BUN 11/02/2022 9    Creatinine, Ser 11/02/2022 1.61 (H)    Calcium 11/02/2022 7.9 (L)    Total Protein 11/02/2022 5.7 (L)    Albumin 11/02/2022 2.3 (L)    AST 11/02/2022 84 (H)    ALT 11/02/2022 37    Alkaline Phosphatase 11/02/2022 117    Total Bilirubin 11/02/2022 0.3    GFR, Estimated 11/02/2022 44 (L)    Anion gap 11/02/2022 9   No image results found. DG Chest 2 View Result Date: 04/24/2023 CLINICAL DATA:  Shortness of breath.  CHF. EXAM: CHEST - 2 VIEW COMPARISON:  Chest radiograph dated 04/20/2023. FINDINGS: Background of emphysema. There is blunting of the costophrenic angles which may be chronic and scarring or represent trace pleural effusions. Significant improvement in aeration of the lungs. No consolidative changes or pneumothorax. The cardiac silhouette is within limits. Atherosclerotic calcification of the aorta. No acute osseous pathology. IMPRESSION: 1. Significant improvement in aeration of the lungs. 2. Small bilateral pleural effusions. 3. Emphysema. Electronically Signed   By: Elgie Collard M.D.   On: 04/24/2023 14:22   ECHOCARDIOGRAM COMPLETE Result Date: 04/21/2023    ECHOCARDIOGRAM REPORT   Patient Name:   NAJIB COLMENARES Date of Exam: 04/21/2023 Medical Rec #:  [id number]        Height:       71.0 in Accession #:    [id number]       Weight:       144.0 lb Date of Birth:  07-15-1945        BSA:          1.834 m Patient Age:    19 years         BP:           168/64 mmHg Patient Gender: M                HR:           94 bpm. Exam Location:  Inpatient Procedure: 2D Echo, Cardiac Doppler, Color Doppler and Intracardiac            Opacification Agent (Both Spectral and Color Flow Doppler were            utilized during procedure). Indications:    Dyspnea  History:        Patient has prior history of Echocardiogram examinations, most                 recent 11/07/2022. COPD; Risk Factors:Hypertension, Dyslipidemia  and Current Smoker.  Sonographer:    Karma Ganja Referring Phys: [id number] PAULA V ROSS  Sonographer Comments: Technically difficult study due to poor echo windows. Image acquisition challenging due to patient body habitus, Image acquisition challenging due to COPD and Image acquisition challenging due to respiratory motion. IMPRESSIONS  1. Left ventricular ejection fraction, by estimation, is 40 to 45%. The left ventricle has mildly decreased function. The left ventricle demonstrates global hypokinesis. There is mild left ventricular hypertrophy. Left ventricular diastolic parameters are consistent with Grade I diastolic dysfunction (impaired relaxation).  2. Right ventricular systolic function is normal. The right ventricular size is normal. There is mildly elevated pulmonary artery systolic pressure. The estimated right ventricular systolic pressure is 38.0 mmHg.  3. Moderate pleural effusion in both left and right lateral regions.  4. The mitral valve is abnormal. Trivial mitral valve regurgitation.  5. The aortic valve is tricuspid. Aortic valve regurgitation is trivial. Aortic valve sclerosis/calcification is present, without any evidence of aortic stenosis.  6. The inferior vena cava is normal in size with greater than 50% respiratory variability, suggesting right atrial pressure of 3 mmHg. Comparison(s): Changes from prior study are noted. 11/07/2022: LVEF 60-65%. FINDINGS   Left Ventricle: Left ventricular ejection fraction, by estimation, is 40 to 45%. The left ventricle has mildly decreased function. The left ventricle demonstrates global hypokinesis. Definity contrast agent was given IV to delineate the left ventricular  endocardial borders. The left ventricular internal cavity size was normal in size. There is mild left ventricular hypertrophy. Left ventricular diastolic parameters are consistent with Grade I diastolic dysfunction (impaired relaxation). Indeterminate filling pressures. Right Ventricle: The right ventricular size is normal. No increase in right ventricular wall thickness. Right ventricular systolic function is normal. There is mildly elevated pulmonary artery systolic pressure. The tricuspid regurgitant velocity is 2.96  m/s, and with an assumed right atrial pressure of 3 mmHg, the estimated right ventricular systolic pressure is 38.0 mmHg. Left Atrium: Left atrial size was normal in size. Right Atrium: Right atrial size was normal in size. Pericardium: There is no evidence of pericardial effusion. Mitral Valve: The mitral valve is abnormal. Mild mitral annular calcification. Trivial mitral valve regurgitation. MV peak gradient, 5.8 mmHg. The mean mitral valve gradient is 3.0 mmHg. Tricuspid Valve: The tricuspid valve is grossly normal. Tricuspid valve regurgitation is mild. Aortic Valve: The aortic valve is tricuspid. Aortic valve regurgitation is trivial. Aortic valve sclerosis/calcification is present, without any evidence of aortic stenosis. Aortic valve mean gradient measures 4.0 mmHg. Aortic valve peak gradient measures 7.2 mmHg. Aortic valve area, by VTI measures 2.32 cm. Pulmonic Valve: The pulmonic valve was normal in structure. Pulmonic valve regurgitation is not visualized. Aorta: The aortic root and ascending aorta are structurally normal, with no evidence of dilitation. Venous: The inferior vena cava is normal in size with greater than 50% respiratory  variability, suggesting right atrial pressure of 3 mmHg. IAS/Shunts: No atrial level shunt detected by color flow Doppler. Additional Comments: There is a moderate pleural effusion in both left and right lateral regions.  LEFT VENTRICLE PLAX 2D LVIDd:         5.10 cm      Diastology LVIDs:         4.00 cm      LV e' medial:    7.72 cm/s LV PW:         1.20 cm      LV E/e' medial:  9.9 LV IVS:  1.10 cm      LV e' lateral:   5.98 cm/s LVOT diam:     2.00 cm      LV E/e' lateral: 12.8 LV SV:         56 LV SV Index:   31 LVOT Area:     3.14 cm  LV Volumes (MOD) LV vol d, MOD A2C: 140.5 ml LV vol d, MOD A4C: 167.0 ml LV vol s, MOD A2C: 88.6 ml LV vol s, MOD A4C: 104.0 ml LV SV MOD A2C:     51.9 ml LV SV MOD A4C:     167.0 ml LV SV MOD BP:      57.7 ml RIGHT VENTRICLE             IVC RV Basal diam:  3.70 cm     IVC diam: 1.90 cm RV S prime:     14.60 cm/s TAPSE (M-mode): 2.8 cm LEFT ATRIUM             Index        RIGHT ATRIUM           Index LA diam:        4.70 cm 2.56 cm/m   RA Area:     14.30 cm LA Vol (A2C):   62.1 ml 33.87 ml/m  RA Volume:   37.50 ml  20.45 ml/m LA Vol (A4C):   40.8 ml 22.25 ml/m LA Biplane Vol: 50.7 ml 27.65 ml/m  AORTIC VALVE AV Area (Vmax):    2.15 cm AV Area (Vmean):   2.02 cm AV Area (VTI):     2.32 cm AV Vmax:           134.00 cm/s AV Vmean:          92.800 cm/s AV VTI:            0.242 m AV Peak Grad:      7.2 mmHg AV Mean Grad:      4.0 mmHg LVOT Vmax:         91.50 cm/s LVOT Vmean:        59.800 cm/s LVOT VTI:          0.179 m LVOT/AV VTI ratio: 0.74  AORTA Ao Root diam: 3.30 cm MITRAL VALVE                TRICUSPID VALVE MV Area (PHT): 4.96 cm     TR Peak grad:   35.0 mmHg MV Area VTI:   2.63 cm     TR Vmax:        296.00 cm/s MV Peak grad:  5.8 mmHg MV Mean grad:  3.0 mmHg     SHUNTS MV Vmax:       1.20 m/s     Systemic VTI:  0.18 m MV Vmean:      76.6 cm/s    Systemic Diam: 2.00 cm MV Decel Time: 153 msec MV E velocity: 76.60 cm/s MV A velocity: 111.00 cm/s MV E/A  ratio:  0.69 Zoila Shutter MD Electronically signed by Zoila Shutter MD Signature Date/Time: 04/21/2023/5:07:36 PM    Final    DG Chest Port 1 View Result Date: 04/20/2023 CLINICAL DATA:  Shortness of breath. EXAM: PORTABLE CHEST 1 VIEW COMPARISON:  Chest radiograph dated 11/05/2022. FINDINGS: Cardiomegaly with vascular congestion. Small bilateral flu shin is and bibasilar atelectasis, left greater than right. Pneumonia is not excluded. No pneumothorax. Atherosclerotic calcification of the aorta. No acute osseous pathology. IMPRESSION: 1. Cardiomegaly  with vascular congestion. 2. Small bilateral pleural effusions and bibasilar atelectasis. Electronically Signed   By: Elgie Collard M.D.   On: 04/20/2023 10:32   MR Brain W and Wo Contrast Result Date: 02/17/2023 CLINICAL DATA:  Altered mental status EXAM: MRI HEAD WITHOUT AND WITH CONTRAST TECHNIQUE: Multiplanar, multiecho pulse sequences of the brain and surrounding structures were obtained without and with intravenous contrast. CONTRAST:  7mL GADAVIST GADOBUTROL 1 MMOL/ML IV SOLN COMPARISON:  None Available. FINDINGS: Brain: No acute infarct, mass effect or extra-axial collection. Multiple chronic microhemorrhages of the cerebellum and occipital lobes. There is multifocal hyperintense T2-weighted signal within the white matter. Generalized volume loss. 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 The midline structures are normal. There is no abnormal contrast enhancement. Vascular: Normal flow voids. Skull and upper cervical spine: Normal calvarium and skull base. Visualized upper cervical spine and soft tissues are normal. Sinuses/Orbits:No paranasal sinus fluid levels or advanced mucosal thickening. No mastoid or middle ear effusion. Normal orbits. IMPRESSION: 1. No acute intracranial abnormality. 2. Multiple chronic microhemorrhages of the cerebellum and occipital lobes. 3. 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 Electronically Signed   By: Deatra Robinson M.D.   On: 02/17/2023 22:58   CT Head Wo Contrast Result Date: 02/17/2023 CLINICAL DATA:  Multiple falls and weakness, head and neck trauma EXAM: CT HEAD WITHOUT CONTRAST CT CERVICAL SPINE WITHOUT CONTRAST TECHNIQUE: Multidetector CT imaging of the head and cervical spine was performed following the standard protocol without intravenous contrast. Multiplanar CT image reconstructions of the cervical spine were also generated. RADIATION DOSE REDUCTION: This exam was performed according to the departmental dose-optimization program which includes automated exposure control, adjustment of the mA and/or kV according to patient size and/or use of iterative reconstruction technique. COMPARISON:  11/05/2022 CT head, 06/23/2009 CT cervical spine FINDINGS: CT HEAD FINDINGS Brain: No evidence of acute infarct, hemorrhage, mass, mass effect, or midline shift. No hydrocephalus or extra-axial fluid collection. Periventricular white matter changes, likely the sequela of chronic small vessel ischemic disease. Age related cerebral atrophy. Remote lacunar infarct in the left basal ganglia. Vascular: No hyperdense vessel. Atherosclerotic calcifications in the intracranial carotid and vertebral arteries. Skull: Negative for fracture or focal lesion. Sinuses/Orbits: Persistent opacification of the right sphenoid sinus with osseous thickening, consistent with chronic sinusitis. Status post bilateral lens replacements. Other: The mastoid air cells are well aerated. CT CERVICAL SPINE FINDINGS Alignment: No traumatic listhesis. Trace anterolisthesis of C7 on T1, which appears degenerative. Skull base and vertebrae: No acute fracture or suspicious osseous lesion. Posterior fusion C3-C4. Soft tissues and spinal canal: No prevertebral fluid or swelling. No visible canal hematoma. Disc levels: Degenerative changes in the cervical spine.No high-grade spinal  canal stenosis. Upper chest: No focal pulmonary opacity or pleural effusion. Emphysema. IMPRESSION: 1. No acute intracranial process. 2. No acute fracture or traumatic listhesis in the cervical spine. Electronically Signed   By: Wiliam Ke M.D.   On: 02/17/2023 17:16   CT Cervical Spine Wo Contrast Result Date: 02/17/2023 CLINICAL DATA:  Multiple falls and weakness, head and neck trauma EXAM: CT HEAD WITHOUT CONTRAST CT CERVICAL SPINE WITHOUT CONTRAST TECHNIQUE: Multidetector CT imaging of the head and cervical spine was performed following the standard protocol without intravenous contrast. Multiplanar CT image reconstructions of the cervical spine were also generated. RADIATION DOSE REDUCTION: This exam was performed according to the departmental dose-optimization program which includes automated exposure control, adjustment of the  mA and/or kV according to patient size and/or use of iterative reconstruction technique. COMPARISON:  11/05/2022 CT head, 06/23/2009 CT cervical spine FINDINGS: CT HEAD FINDINGS Brain: No evidence of acute infarct, hemorrhage, mass, mass effect, or midline shift. No hydrocephalus or extra-axial fluid collection. Periventricular white matter changes, likely the sequela of chronic small vessel ischemic disease. Age related cerebral atrophy. Remote lacunar infarct in the left basal ganglia. Vascular: No hyperdense vessel. Atherosclerotic calcifications in the intracranial carotid and vertebral arteries. Skull: Negative for fracture or focal lesion. Sinuses/Orbits: Persistent opacification of the right sphenoid sinus with osseous thickening, consistent with chronic sinusitis. Status post bilateral lens replacements. Other: The mastoid air cells are well aerated. CT CERVICAL SPINE FINDINGS Alignment: No traumatic listhesis. Trace anterolisthesis of C7 on T1, which appears degenerative. Skull base and vertebrae: No acute fracture or suspicious osseous lesion. Posterior fusion C3-C4.  Soft tissues and spinal canal: No prevertebral fluid or swelling. No visible canal hematoma. Disc levels: Degenerative changes in the cervical spine.No high-grade spinal canal stenosis. Upper chest: No focal pulmonary opacity or pleural effusion. Emphysema. IMPRESSION: 1. No acute intracranial process. 2. No acute fracture or traumatic listhesis in the cervical spine. Electronically Signed   By: Wiliam Ke M.D.   On: 02/17/2023 17:16   DG Elbow 2 Views Right Result Date: 02/17/2023 CLINICAL DATA:  Fall.  Elbow pain. EXAM: RIGHT ELBOW - 2 VIEW COMPARISON:  None Available. FINDINGS: No acute fracture or dislocation. No aggressive osseous lesion. Mild degenerative changes of elbow joint. Olecranon enthesophyte noted. No radiopaque foreign bodies. Soft tissues are within normal limits. IMPRESSION: *No acute osseous abnormality of the right elbow. Electronically Signed   By: Jules Schick M.D.   On: 02/17/2023 16:35      Results LABS Glucose: slightly elevated (05/01/2023) Sodium: stable (05/01/2023) Creatinine: elevated (05/01/2023) Calcium: low (05/01/2023) Hemoglobin: low (05/01/2023)     Assessment & Plan Anemia, unspecified type Progressive decline in hemoglobin since September 2024 Iron studies confirm iron deficiency History of GI bleed in 2021, currently no active bleeding symptoms Contributing to heart failure symptoms Plan: Start Accrufer (ferric maltol) 30mg  twice daily Restart pantoprazole 40mg  daily for GI protection Refer to hematology for evaluation due to macrocytosis doesn't fit for gastrointestinal bleeding. CBC monitoring in 4 weeks Consider GI evaluation if no improvement Congestive heart failure with left ventricular diastolic dysfunction, acute on chronic (HCC) Recently hospitalized for decompensation due to dietary indiscretion Currently euvolemic with improved symptoms Echo shows global hypokinesis, mild pulmonary hypertension BNP improved but remains elevated at  491 Plan: Continue current heart failure regimen including hydralazine and isosorbide Strict sodium restriction counseling provided Daily weight monitoring Follow up with cardiology within 2 weeks Monitor renal function and electrolytes closely Iron deficiency  Duodenal ulcer Reviewed history per Dr. [name] who wanted him on chronic proton pump inhibitor (PPI) stomach acid reducer, confirmed he has stopped taking at some point and encouraged patient to resume, and follow up with Dr. [name] due to ? gastrointestinal bleeding. Chronic low back pain with bilateral sciatica, unspecified back pain laterality Currently on oxycodone 10mg  every 8 hours 90tabs monthly - has shifted back to me prescribing from pain clinic. Contributing to constipation Plan: Transition to oxycodone-acetaminophen 5-325mg  q4h PRN Continue current muscle relaxants Emphasize non-pharmacologic pain management Follow up with pain management as scheduled Stage 3 chronic kidney disease, unspecified whether stage 3a or 3b CKD (HCC) Stable creatinine at 1.30 No active AKI Plan as its resolved and he off diuretics Continue current medications Monitor  renal function Adjust medications as needed based on GFR  Follow-up: Return to clinic in 2 weeks for reassessment of volume status and medication adjustment. Medications Started/Modified Today: Accrufer 30mg  PO BID Protonix 40mg  daily Percocet 5/325mg  1 tab q4h PRN severe pain Patient expressed understanding of treatment plan and agreement with follow-up schedule.      Orders Placed During this Encounter:   Orders Placed This Encounter  Procedures   Ambulatory referral to Hematology / Oncology    Referral Priority:   Routine    Referral Type:   Consultation    Referral Reason:   Specialty Services Required    Requested Specialty:   Oncology    Number of Visits Requested:   1   Meds ordered this encounter  Medications   Ferric Maltol (ACCRUFER) 30 MG CAPS    Sig:  Take 1 capsule (30 mg total) by mouth 2 (two) times daily. Recheck iron to decide how long to continue    Dispense:  180 capsule    Refill:  3   pantoprazole (PROTONIX) 40 MG tablet    Sig: Take 1 tablet (40 mg total) by mouth daily.    Dispense:  90 tablet    Refill:  3   oxyCODONE-acetaminophen (PERCOCET/ROXICET) 5-325 MG tablet    Sig: Take 1 tablet by mouth every 4 (four) hours as needed for severe pain (pain score 7-10). Fill when due    Dispense:  90 tablet    Refill:  0        **This document was synthesized by artificial intelligence ([employer]) using HIPAA-compliant recording of the clinical interaction;   We discussed the use of AI scribe software for clinical note transcription with the patient, who gave verbal consent to proceed.    Additional Info: This encounter employed state-of-the-art, real-time, collaborative documentation. The patient actively reviewed and assisted in updating their electronic medical record on a shared screen, ensuring transparency and facilitating joint problem-solving for the problem list, overview, and plan. This approach promotes accurate, informed care. The treatment plan was discussed and reviewed in detail, including medication safety, potential side effects, and all patient questions. We confirmed understanding and comfort with the plan. Follow-up instructions were established, including contacting the office for any concerns, returning if symptoms worsen, persist, or new symptoms develop, and precautions for potential emergency department visits.   Time-Based Attestation  I spent 42 minutes of total time on the date of service providing care for this patient. This extended time was medically necessary due to the complexity of managing multiple interacting conditions including heart failure, anemia, and chronic kidney disease requiring careful medication adjustments. Time included:  - Comprehensive review of recent hospitalization records and  laboratory results - Detailed examination and assessment of volume status - Complex medical decision making regarding iron replacement options - Extensive counseling regarding dietary sodium restriction - Medication adjustments and coordination with pharmacy - Review and coordination of follow-up care plan with patient and family - Documentation of encounter  Time spent was independent of any separately billable procedures. More than 50% of the visit was spent on counseling and coordination of care.  Total face-to-face time: 49 minutes

## 2023-05-11 ENCOUNTER — Telehealth (HOSPITAL_COMMUNITY): Payer: Self-pay | Admitting: Cardiology

## 2023-05-11 NOTE — Telephone Encounter (Signed)
-----   Message from CMA Philicia B sent at 05/10/2023 11:49 AM EDT ----- Regarding: RE: iron infusion PA Scheduled for 4/18 @9am , Tried calling patient, no answer,unable to leave message. ----- Message ----- From: Theresia Bough, CMA Sent: 05/02/2023   2:03 PM EDT To: Greggory Brandy Branch, CMA Subject: iron infusion PA                               PA is needed for iron infusion When scheduling please try to get on same day as upcoming followup 4/18?

## 2023-05-16 NOTE — Progress Notes (Addendum)
 HEART & VASCULAR TRANSITION OF CARE NOTE   PCP: Anthon Kins, MD  Cardiologist: Dr. Avanell Bob  Chief Complaint: HFmrEF  HPI:  Michael Bender is a 78 y.o. male with a hx of HTN, emphysema and new diagnosis of HFmrEF.  Echo 10/24 showed EF 60-65%.  He had an admission for falls/weakness and rhabdomyolysis in 10/24. Discharged to SNF but then brought back with sepsis due to MSSA bacteremia. Found to have septic left wrist arthritis treated with capsulotomy. Also found to have C5-C6 discitis. Required 6 weeks of IV abx, followed by ID. Course c/b new Afib with RVR. Anticoagulated not started d/t falls.  Admitted 3/25 with a/c HF. He was hypertensive. Echo showed  EF 40-45%, G1DD and normal RV. Diuresed with IV lasix . Had AKI so cath deferred. GDMT titrated and he was discharged home, weight 136 lbs.  Seen in [facility] clinic 03/31. Started on losartan . cMRI ordered to assess for infiltrative disease.  Here today for CHF follow-up. Accompanied by his son who assists with the history. Denies shortness of breath, orthopnea, PND or lower extremity edema. Home weight stable between 145-147 lb. He lives alone but has assistance from his sons with pill boxes, grocery shopping and other needs. His son is not sure what meds he is taking. His brother arranges meds. He does question if he may not be taking all of the BP meds on his list. Has BP cuff arriving in the mail.  No EOTH, quit smoking 4 years ago, no drugs. Son says patient's memory is declining. No recent falls.  Family Hx: mother with MI Social Hx: no tobacco or drugs, stopped smoking 4 years ago  Cardiac Testing  - Echo 3/25: EF 40-45%, mild LVH, normal RV - Echo 10/24: EF 60-65%, normal RV  Past Medical History:  Diagnosis Date   Acute combined systolic and diastolic heart failure (HCC) 04/22/2023   Acute on chronic diastolic CHF (congestive heart failure) (HCC) 04/20/2023   Arthritis    Back   Balance problems 02/21/2023    Blood transfusion    as a child   Chronic back pain    COPD (chronic obstructive pulmonary disease) (HCC)    Disturbance of skin sensation 05/22/2018   Effusion of right knee 02/21/2023   Effusion of right knee joint 01/16/2023   Elevated troponin 04/22/2023   Essential hypertension 03/05/2007   Qualifier: Diagnosis of   By: Ena Harries MD, Viktoria Gray      Finger pain, left 11/08/2022   Gastric erosion    Gastritis and gastroduodenitis    Gastrointestinal hemorrhage with melena 11/20/2018   GIB (gastrointestinal bleeding) 07/18/2019   Hardware complicating wound infection (HCC) 11/10/2022   Headache(784.0)    last one 6 months ago   Hemorrhoids, internal    History of duodenal ulcer    History of lumbar fusion 03/03/2022   RE-OPERATIVE DIAGNOSIS:  lumbar stenosis synovial cyst lumbar spondylosis spondylolisthesis lumbar radiculoapthy L4/5   PROCEDURE:  Procedure(s): POSTERIOR LUMBAR FUSION 1 LEVEL with resection of synovial cyst     History of upper gastrointestinal bleeding 03/03/2022   Duodenal ulcer 2021 Dr. Willy Harvest   HLD (hyperlipidemia)    Hypertension    Hypokalemia 04/22/2023   Hypomagnesemia 04/22/2023   Infected blister of left index finger 11/07/2022   Intractable pain 03/03/2022   Loss of weight    Wt Readings from Last 10 Encounters:  04/05/22  146 lb (66.2 kg)  03/03/22  143 lb 9.6 oz (65.1 kg)  07/14/21  153 lb (69.4 kg)  12/14/20  147 lb 12.8 oz (67 kg)  11/13/19  154 lb (69.9 kg)  09/16/19  146 lb (66.2 kg)  08/06/19  146 lb (66.2 kg)  07/19/19  144 lb 13.5 oz (65.7 kg)  07/17/19  148 lb (67.1 kg)  07/09/19  147 lb 9.6 oz (67 kg)         MSSA bacteremia 11/07/2022   Neck rigidity    post cervical fusion   Nocturia    PAIN, CHRONIC NEC 10/06/2006   Qualifier: Diagnosis of   By: Ena Harries RN, Willetta Harpin       Pneumonia    Prostate disease    Right sided temporal headache 05/22/2018   S/P cervical spinal fusion 03/03/2022   With persistent cervical pain and palpable  screws Led to disability History of attempt to dig furrow in skull to fix it Last surgery 1992   Senile ecchymosis 01/21/2019   Septic arthritis of wrist, left (HCC) 11/08/2022   Septic infrapatellar bursitis of right knee 11/07/2022   Staphylococcal Arthritis of Right Knee (Updated 01/30/2023) MRI 01/17/2023 shows worsening findings:  Worsening tear of posterior horn medial meniscus with large radial component Worsening subcortical stress fracture/osteochondral lesion of medial femoral condyle New subcortical stress fracture/osteochondral lesion of medial tibial plateau Moderate-to-large effusion with worsened synovitis    Staphylococcal arthritis of left wrist (HCC) 11/07/2022   Staphylococcal arthritis of right knee (HCC) 11/08/2022   Syncope 11/05/2022   Current Outpatient Medications  Medication Sig Dispense Refill   amitriptyline  (ELAVIL ) 50 MG tablet Take 1 tablet (50 mg total) by mouth at bedtime. 90 tablet 3   amLODipine  (NORVASC ) 5 MG tablet Take 1 tablet (5 mg total) by mouth daily. 30 tablet 0   cyclobenzaprine  (FLEXERIL ) 10 MG tablet Take 1 tablet (10 mg total) by mouth 3 (three) times daily as needed. 270 tablet 3   Ferric Maltol  (ACCRUFER ) 30 MG CAPS Take 1 capsule (30 mg total) by mouth 2 (two) times daily. Recheck iron to decide how long to continue 180 capsule 3   hydrALAZINE  (APRESOLINE ) 50 MG tablet Take 1 tablet (50 mg total) by mouth every 8 (eight) hours. 90 tablet 0   ipratropium-albuterol  (DUONEB) 0.5-2.5 (3) MG/3ML SOLN Take 3 mLs by nebulization every 4 (four) hours as needed. 90 mL 1   isosorbide  mononitrate (IMDUR ) 60 MG 24 hr tablet Take 1 tablet (60 mg total) by mouth daily. 30 tablet 0   losartan  (COZAAR ) 25 MG tablet Take 1 tablet (25 mg total) by mouth daily. 90 tablet 3   Oxycodone  HCl 10 MG TABS Take 1 tablet (10 mg total) by mouth every 8 (eight) hours as needed. 90 tablet 0   pantoprazole  (PROTONIX ) 40 MG tablet Take 1 tablet (40 mg total) by mouth  daily. 90 tablet 3   polyethylene glycol (MIRALAX  / GLYCOLAX ) 17 g packet Take 17 g by mouth daily as needed for moderate constipation. 14 each 0   Respiratory Therapy Supplies (NEBULIZER/TUBING/MOUTHPIECE) KIT 1 each by Does not apply route every 4 (four) hours as needed. 1 kit 1   Respiratory Therapy Supplies (NEBULIZER/TUBING/MOUTHPIECE) KIT 1 each by Does not apply route every 4 (four) hours as needed. 1 kit 1   rosuvastatin  (CRESTOR ) 40 MG tablet TAKE 1 TABLET BY MOUTH DAILY. REPLACES ATORVASTATIN  (STOP ATORVASTATIN  IF STILL TAKING) 90 tablet 3   SUMAtriptan  (IMITREX ) 50 MG tablet TAKE 1 TABLET (50 MG TOTAL) BY MOUTH DAILY. MAY REPEAT IN 2 HOURS IF  HEADACHE PERSISTS OR RECURS. 9 tablet 1   No current facility-administered medications for this encounter.   Allergies  Allergen Reactions   Nubain [Nalbuphine Hcl]     Muscle contraction   Social History   Socioeconomic History   Marital status: Widowed    Spouse name: Not on file   Number of children: 2   Years of education: Not on file   Highest education level: High school graduate  Occupational History   Occupation: Disabled  Tobacco Use   Smoking status: Former    Current packs/day: 0.00    Types: Cigarettes    Start date: 12/12/1978    Quit date: 12/12/2018    Years since quitting: 4.4   Smokeless tobacco: Never  Vaping Use   Vaping status: Never Used  Substance and Sexual Activity   Alcohol use: Not Currently    Alcohol/week: 2.0 standard drinks of alcohol    Types: 2 Shots of liquor per week   Drug use: No   Sexual activity: Not on file  Other Topics Concern   Not on file  Social History Narrative   Patient is divorced 2 sons he is a retired Engineer, 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  He drinks about a liter of caffeinated drinks a day past smoker no drug use no alcohol.   Social Drivers of Corporate investment banker Strain: Low Risk  (04/24/2023)   Overall Financial Resource  Strain (CARDIA)    Difficulty of Paying Living Expenses: Not very hard  Food Insecurity: No Food Insecurity (04/26/2023)   Hunger Vital Sign    Worried About Running Out of Food in the Last Year: Never true    Ran Out of Food in the Last Year: Never true  Transportation Needs: No Transportation Needs (04/26/2023)   PRAPARE - Administrator, Civil Service (Medical): No    Lack of Transportation (Non-Medical): No  Physical Activity: Insufficiently Active (10/13/2022)   Exercise Vital Sign    Days of Exercise per Week: 3 days    Minutes of Exercise per Session: 30 min  Stress: No Stress Concern Present (10/13/2022)   [employer] of Occupational Health - Occupational Stress Questionnaire    Feeling of Stress : Not at all  Social Connections: Socially Isolated (04/20/2023)   Social Connection and Isolation Panel [NHANES]    Frequency of Communication with Friends and Family: Twice a week    Frequency of Social Gatherings with Friends and Family: More than three times a week    Attends Religious Services: Never    Database administrator or Organizations: No    Attends Banker Meetings: Never    Marital Status: Widowed  Intimate Partner Violence: Not At Risk (04/26/2023)   Humiliation, Afraid, Rape, and Kick questionnaire    Fear of Current or Ex-Partner: No    Emotionally Abused: No    Physically Abused: No    Sexually Abused: No   Family History  Problem Relation Age of Onset   Heart disease Mother    Heart attack Mother 65   Pancreatic cancer Father 74   Pancreatic cancer Brother    Anesthesia problems Neg Hx    Colon cancer Neg Hx    Liver cancer Neg Hx    Stomach cancer Neg Hx    Esophageal cancer Neg Hx    Rectal cancer Neg Hx    Wt Readings from Last 3 Encounters:  05/19/23 69.7 kg (153 lb 9.6 oz)  05/19/23 65.8 kg (145 lb)  05/03/23 65 kg (143 lb 3.2 oz)   BP (!) 200/76   Pulse 77   Ht 5\' 11"  (1.803 m)   Wt 69.7 kg (153 lb 9.6 oz)   SpO2  97%   BMI 21.42 kg/m   BP on recheck 164/70 mmHg  PHYSICAL EXAM: General:  Well appearing. No resp difficulty HEENT: normal Neck: supple. no JVD. Carotids 2+ bilat; no bruits. No lymphadenopathy or thryomegaly appreciated. Cor: PMI nondisplaced. Regular rate & rhythm. No rubs, gallops or murmurs. Lungs: clear Abdomen: soft, nontender, nondistended. No hepatosplenomegaly. No bruits or masses. Good bowel sounds. Extremities: no cyanosis, clubbing, rash, edema Neuro: alert & orientedx3, cranial nerves grossly intact. moves all 4 extremities w/o difficulty. Affect pleasant    ASSESSMENT & PLAN: HFmrEF - Echo 10/24: EF 60-65% - Echo 3/25: EF 40-45% - Unclear etiology for reduction in EF. Could be related to septic cardiomyopathy vs uncontrolled HTN. - Cath deferred due to AKI on CKD. Already has cMRI scheduled for next month to rule out infiltrative disease - NYHA II. Volume looks good today. Not requiring diuretic.  - Continue losartan  25 mg daily. - Continue hydralazine  50 mg tid + Imdur  60 mg daily. - Consider bisoprolol next, eventually spiro if renal function remains stable - Planned to switch losartan  to entresto but it is not clear what meds he is taking. Son will contact us  to confirm home meds and we will make adjustments accordingly - Would avoid SGLT2i with concern for hygiene and volume depletion - Not a candidate for advanced therapies with significant COPD and CKD.  - BMET/BNP today  2. CKD 3 - Cr fluctuating between 1.3-1.8 in recent months - Scr most recently 1.3 - Repeat labs today after recent addition of ARB  3. HTN - BP elevated. Initially 200 systolic, improved to 160 on recheck - Eventually consider stopping amlodipine  to titrate GDMT - See discussion above  4. COPD - Significant emphysema - per PCP  5. Atrial fibrillation -Episode noted during admission with sepsis 10/24. Spontaneously converted to SR. -SR on subsequent ECGs during admissions. SR on  ECG last visit. -Was not anticoagulated at that time d/t falls. Also note history of GI bleed d/t duodenal ulcer in 2021. Would discuss risks/benefits of long-term anticoagulation at f/u visit with Cardiology.  6. Iron deficiency anemia -Received IV iron this am -PCP plans to recheck CBC in 2 weeks -PCP has referred for GI evaluation d/t history of GI bleed  Referred to HFSW (PCP, Medications, Transportation, ETOH Abuse, Drug Abuse, Insurance, Financial ): No Refer to Pharmacy: No Refer to Home Health: No Refer to Advanced Heart Failure Clinic: No Refer to General Cardiology: No, established with Dr. Avanell Bob  Follow up PRN  Gerilyn Kobus, PA-C 05/19/23

## 2023-05-16 NOTE — Telephone Encounter (Signed)
Scheduled, patient made aware

## 2023-05-17 ENCOUNTER — Encounter: Payer: Self-pay | Admitting: Internal Medicine

## 2023-05-17 ENCOUNTER — Telehealth (INDEPENDENT_AMBULATORY_CARE_PROVIDER_SITE_OTHER): Payer: Medicare HMO | Admitting: Internal Medicine

## 2023-05-17 DIAGNOSIS — G8929 Other chronic pain: Secondary | ICD-10-CM | POA: Diagnosis not present

## 2023-05-17 DIAGNOSIS — M5441 Lumbago with sciatica, right side: Secondary | ICD-10-CM | POA: Diagnosis not present

## 2023-05-17 DIAGNOSIS — M25461 Effusion, right knee: Secondary | ICD-10-CM

## 2023-05-17 DIAGNOSIS — I5033 Acute on chronic diastolic (congestive) heart failure: Secondary | ICD-10-CM

## 2023-05-17 DIAGNOSIS — R413 Other amnesia: Secondary | ICD-10-CM

## 2023-05-17 DIAGNOSIS — D649 Anemia, unspecified: Secondary | ICD-10-CM | POA: Diagnosis not present

## 2023-05-17 DIAGNOSIS — N183 Chronic kidney disease, stage 3 unspecified: Secondary | ICD-10-CM | POA: Diagnosis not present

## 2023-05-17 DIAGNOSIS — M5442 Lumbago with sciatica, left side: Secondary | ICD-10-CM | POA: Diagnosis not present

## 2023-05-17 MED ORDER — OXYCODONE HCL 10 MG PO TABS: 10.0000 mg | ORAL_TABLET | Freq: Three times a day (TID) | ORAL | 0 refills | Status: DC | PRN

## 2023-05-17 NOTE — Patient Instructions (Addendum)
 Call and schedule labwork in 2 weeks Call Dr. Willy Harvest and schedule for gastrointestinal evaluation.  Go to emergency room if passing out Check blood pressure daily and stools for blood.   Team Member Role and Visual merchandiser Info Address Start End Comments  Kenney Peacemaker, MD Consulting Physician (Gastroenterology) Phone: [phone] Fax: [phone] Email: [email or web address] [address] [place] [address] 05/03/2023 - -         Now that you have been diagnosed with worsening anemia, it's important to be aware of the following symptoms that may indicate a need for immediate medical attention:  Extreme fatigue or weakness Shortness of breath Rapid or irregular heartbeat Chest pain Dizziness or lightheadedness Pale or yellowish skin Cold hands and feet Headaches  Emergency symptoms specific to anemia include: A feeling that you're going to pass out Low blood pressure Low blood oxygen saturation, which may be measured with a pulse oximeter at home3 Loss of consciousness Please advise the patient to seek emergency care if they experience any of these symptoms, or any other symptoms that cause concern.  It's always better to be safe and get checked out by a healthcare professional if you are unsure.    When to recheck the anemia depends on how you are feeling... but if you feel fine and dont see any significant bleeding, then I'd suggest doing it in about a month.  In the meantime be sure to consistently check toilet to assess your bleeding and discuss whether to take blood thinners with me.    VISIT SUMMARY:  Today, we discussed your medication management, upcoming iron infusion, and cardiovascular care. We reviewed your current health status, including your history of GI bleeding, heart failure, and chronic low back pain. We also addressed a medication error and planned for follow-up appointments and blood work.  YOUR PLAN:  -ANEMIA: Anemia is a  condition where you have a lower than normal number of red blood cells. You are scheduled for an iron infusion on Friday to address your low iron levels, likely due to a past GI bleed. Continue taking pantoprazole  to protect against GI bleeding, and monitor for any signs of GI bleeding, such as visible blood in your stool. If you notice significant bleeding or symptoms of severe anemia, go to the ER immediately. We will check your blood count in two weeks and schedule a GI evaluation with Dr. Willy Harvest.  -CHRONIC KIDNEY DISEASE: Chronic kidney disease means your kidneys are not working as well as they should. We are monitoring your kidney function, especially since it can be affected by anemia and GI bleeding. We will recheck your kidney function in two weeks along with your blood count and coordinate with the cardiovascular center.  -CONGESTIVE HEART FAILURE: Congestive heart failure means your heart is not pumping blood as well as it should. Your recent episode of heart failure has resolved, likely caused by anemia from GI bleeding. There are no current signs of fluid buildup or shortness of breath. Continue with your current heart failure management plan and monitor for any signs of recurrence.  -MEDICATION ERROR - OXYCODONE : There was an error in your prescription, and you were given oxycodone  with acetaminophen  instead of your usual oxycodone  10 mg without acetaminophen . We have corrected this, and you should now take oxycodone  10 mg without acetaminophen , one tablet every eight hours as needed for pain. The pharmacy will fill the correct prescription as soon as possible.  -GENERAL HEALTH MAINTENANCE: You have quit smoking, which is  excellent for your overall health. Your blood pressure has normalized, likely due to medication adjustments. Continue with your current health practices and medication regimen.  INSTRUCTIONS:  You have multiple follow-up appointments scheduled, including your iron infusion  and cardiovascular follow-up. We will reassess your condition after these appointments and the scheduled blood work. Please wait to fill the Blink Rx prescription until after the iron infusion to determine if it is necessary. Follow up with blood work in two weeks, schedule a follow-up appointment after the blood work, coordinate with the cardiovascular center for follow-up care, and ensure you pick up your medications from the pharmacy.   It was a pleasure seeing you today! Your health and satisfaction are our top priorities.  Scherrie Curt, MD  Your Providers PCP: Anthon Kins, MD,  [phone]) Referring Provider: Anthon Kins, MD,  [phone]) Care Team Provider: Shirline Dover, DO,  [phone]) Care Team Provider: Ernie Heal, Jerelyn Money, MD,  [phone]) Care Team Provider: Kenney Peacemaker, MD,  [phone])     NEXT STEPS: [x]  Early Intervention: Schedule sooner appointment, call our on-call services, or go to emergency room if there is any significant Increase in pain or discomfort New or worsening symptoms Sudden or severe changes in your health [x]  Flexible Follow-Up: We recommend a Return in about 3 weeks (around 06/07/2023) for review problems and medications f/u opioid mgmt must be less than 30 days. for optimal routine care. This allows for progress monitoring and treatment adjustments. [x]  Preventive Care: Schedule your annual preventive care visit! It's typically covered by insurance and helps identify potential health issues early. [x]  Lab & X-ray Appointments: Incomplete tests scheduled today, or call to schedule. X-rays: [facility] (M-F, 8:30am-noon or 1pm-5pm). [x]  Medical Information Release: Sign a release form at front desk to obtain relevant medical information we don't have.  MAKING THE MOST OF OUR FOCUSED 20 MINUTE APPOINTMENTS: [x]   Clearly state your top concerns at the beginning of the visit to focus our discussion [x]   If you  anticipate you will need more time, please inform the front desk during scheduling - we can book multiple appointments in the same week. [x]   If you have transportation problems- use our convenient video appointments or ask about transportation support. [x]   We can get down to business faster if you use MyChart to update information before the visit and submit non-urgent questions before your visit. Thank you for taking the time to provide details through MyChart.  Let our nurse know and she can import this information into your encounter documents.  Arrival and Wait Times: [x]   Arriving on time ensures that everyone receives prompt attention. [x]   Early morning (8a) and afternoon (1p) appointments tend to have shortest wait times. [x]   Unfortunately, we cannot delay appointments for late arrivals or hold slots during phone calls.  Getting Answers and Following Up [x]   Simple Questions & Concerns: For quick questions or basic follow-up after your visit, reach us  at [phone] or MyChart messaging. [x]   Complex Concerns: If your concern is more complex, scheduling an appointment might be best. Discuss this with the staff to find the most suitable option. [x]   Lab & Imaging Results: We'll contact you directly if results are abnormal or you don't use MyChart. Most normal results will be on MyChart within 2-3 business days, with a review message from Dr. Boston Byers. Haven't heard back in 2 weeks? Need results sooner? Contact us  at [phone]. [x]   Referrals: Our  referral coordinator will manage specialist referrals. The specialist's office should contact you within 2 weeks to schedule an appointment. Call us  if you haven't heard from them after 2 weeks.  Staying Connected [x]   MyChart: Activate your MyChart for the fastest way to access results and message us . See the last page of this paperwork for instructions on how to activate.  Bring to Your Next Appointment [x]   Medications: Please bring  all your medication bottles to your next appointment to ensure we have an accurate record of your prescriptions. [x]   Health Diaries: If you're monitoring any health conditions at home, keeping a diary of your readings can be very helpful for discussions at your next appointment.  Billing [x]   X-ray & Lab Orders: These are billed by separate companies. Contact the invoicing company directly for questions or concerns. [x]   Visit Charges: Discuss any billing inquiries with our administrative services team.  Your Satisfaction Matters [x]   Share Your Experience: We strive for your satisfaction! If you have any complaints, or preferably compliments, please let Dr. Boston Byers know directly or contact our Practice Administrators, Olinda Bertrand or [employer], by asking at the front desk.   Reviewing Your Records [x]   Review this early draft of your clinical encounter notes below and the final encounter summary tomorrow on MyChart after its been completed.  All orders placed so far are visible here: Anemia, unspecified type -     CBC with Differential/Platelet; Future -     Comprehensive metabolic panel with GFR  Chronic bilateral low back pain with bilateral sciatica -     oxyCODONE  HCl; Take 1 tablet (10 mg total) by mouth every 8 (eight) hours as needed.  Dispense: 90 tablet; Refill: 0  Memory loss  Stage 3 chronic kidney disease, unspecified whether stage 3a or 3b CKD (HCC) -     CBC with Differential/Platelet; Future -     Comprehensive metabolic panel with GFR  Congestive heart failure with left ventricular diastolic dysfunction, acute on chronic (HCC)

## 2023-05-17 NOTE — Progress Notes (Signed)
 ====================================  [facility]: [phone]   --  Virtual Video Medical Office Visit --  Patient: Michael Bender      Age: 78 y.o.       Sex:  male  Date:   05/17/2023 Today's Healthcare Provider: Anthon Kins, MD  ====================================    Chief Complaint/Reason For Visit: Heart Failure and Anemia, hospital follow up, pain management.   Chart reviewed: has Pulmonary emphysema (HCC); GERD; CONSTIPATION, CHRONIC; DISC DISEASE, LUMBAR; Primary hypertension; Bilateral leg pain; Esophageal dysphagia; Insomnia; PAD (peripheral artery disease) (HCC); History of CVA (cerebrovascular accident); Anemia; HLD (hyperlipidemia); COPD (chronic obstructive pulmonary disease) (HCC); Iron deficiency anemia; Duodenal stenosis; Chronic narcotic dependence (HCC); Atherosclerosis of aorta (HCC); History of fusion of cervical spine; History of lumbar fusion; High risk medication use; Gynecomastia, male; Cachexia (HCC); Underweight on examination; Chronic low back pain with bilateral sciatica; AKI (acute kidney injury) (HCC); Neck pain; Cervical discitis; Memory loss; Frequent falls; Nonintractable headache; Balance problems; Congestive heart failure with left ventricular diastolic dysfunction, acute on chronic (HCC); and Orthostatic hypotension on their problem list..  Chart reviewed:  has a past medical history of Acute combined systolic and diastolic heart failure (HCC) (08/65/7846), Acute on chronic diastolic CHF (congestive heart failure) (HCC) (04/20/2023), Arthritis, Balance problems (02/21/2023), Blood transfusion, Chronic back pain, COPD (chronic obstructive pulmonary disease) (HCC), Disturbance of skin sensation (05/22/2018), Effusion of right knee (02/21/2023), Effusion of right knee joint (01/16/2023), Elevated troponin (04/22/2023), Essential hypertension (03/05/2007), Finger pain, left (11/08/2022), Gastric erosion, Gastritis and  gastroduodenitis, Gastrointestinal hemorrhage with melena (11/20/2018), GIB (gastrointestinal bleeding) (07/18/2019), Hardware complicating wound infection (HCC) (11/10/2022), Headache(784.0), Hemorrhoids, internal, History of duodenal ulcer, History of lumbar fusion (03/03/2022), History of upper gastrointestinal bleeding (03/03/2022), HLD (hyperlipidemia), Hypertension, Hypokalemia (04/22/2023), Hypomagnesemia (04/22/2023), Infected blister of left index finger (11/07/2022), Intractable pain (03/03/2022), Loss of weight, MSSA bacteremia (11/07/2022), Neck rigidity, Nocturia, PAIN, CHRONIC NEC (10/06/2006), Pneumonia, Prostate disease, Right sided temporal headache (05/22/2018), S/P cervical spinal fusion (03/03/2022), Senile ecchymosis (01/21/2019), Septic arthritis of wrist, left (HCC) (11/08/2022), Septic infrapatellar bursitis of right knee (11/07/2022), Staphylococcal arthritis of left wrist (HCC) (11/07/2022), Staphylococcal arthritis of right knee (HCC) (11/08/2022), and Syncope (11/05/2022).  History of Present Illness 78 year old male with GI bleeding and heart failure who presents for medication management and follow-up on iron infusion and cardiovascular care.  He was previously taking 10 mg of oxycodone  every eight hours for chronic low back pain with bilateral sciatica, but the recent prescription was for oxycodone  with acetaminophen , 5 mg every four hours. This change was not intended, and he prefers to return to the previous regimen of 10 mg oxycodone  without acetaminophen  every eight hours.  He is scheduled for an iron infusion on Friday morning, which was previously uncertain due to insurance coverage. This is a follow-up from a previous office visit two weeks ago. There is a concern for GI bleeding, as he has a history of GI bleed in 2021. No visible blood in stool and regular bowel movements, approximately every other day. He is taking pantoprazole  to protect against stress ulcers.  He  has a history of heart failure, which resolved without clear cause. There is a suspicion that previous GI bleeding may have contributed to the heart failure. No current shortness of breath or fluid retention, and his weight has remained stable between 145 to 147 pounds. He is not taking Lasix  or any fluid pills currently, as there is no evidence of fluid buildup. His breathing is stable,  and he maintains his usual activity level without shortness of breath.  He is taking rosuvastatin  and has quit smoking. His blood pressure was previously high but has normalized, possibly due to a new blood pressure medication prescribed by a PA at the cardiovascular center.   Medications reviewed Current Outpatient Medications on File Prior to Visit  Medication Sig   amitriptyline  (ELAVIL ) 50 MG tablet Take 1 tablet (50 mg total) by mouth at bedtime.   amLODipine  (NORVASC ) 5 MG tablet Take 1 tablet (5 mg total) by mouth daily.   cyclobenzaprine  (FLEXERIL ) 10 MG tablet Take 1 tablet (10 mg total) by mouth 3 (three) times daily as needed.   Ferric Maltol  (ACCRUFER ) 30 MG CAPS Take 1 capsule (30 mg total) by mouth 2 (two) times daily. Recheck iron to decide how long to continue   hydrALAZINE  (APRESOLINE ) 50 MG tablet Take 1 tablet (50 mg total) by mouth every 8 (eight) hours.   ipratropium-albuterol  (DUONEB) 0.5-2.5 (3) MG/3ML SOLN Take 3 mLs by nebulization every 4 (four) hours as needed.   isosorbide  mononitrate (IMDUR ) 60 MG 24 hr tablet Take 1 tablet (60 mg total) by mouth daily.   losartan  (COZAAR ) 25 MG tablet Take 1 tablet (25 mg total) by mouth daily.   pantoprazole  (PROTONIX ) 40 MG tablet Take 1 tablet (40 mg total) by mouth daily.   polyethylene glycol (MIRALAX  / GLYCOLAX ) 17 g packet Take 17 g by mouth daily as needed for moderate constipation.   Respiratory Therapy Supplies (NEBULIZER/TUBING/MOUTHPIECE) KIT 1 each by Does not apply route every 4 (four) hours as needed.   Respiratory Therapy Supplies  (NEBULIZER/TUBING/MOUTHPIECE) KIT 1 each by Does not apply route every 4 (four) hours as needed.   rosuvastatin  (CRESTOR ) 40 MG tablet TAKE 1 TABLET BY MOUTH DAILY. REPLACES ATORVASTATIN  (STOP ATORVASTATIN  IF STILL TAKING)   SUMAtriptan  (IMITREX ) 50 MG tablet TAKE 1 TABLET (50 MG TOTAL) BY MOUTH DAILY. MAY REPEAT IN 2 HOURS IF HEADACHE PERSISTS OR RECURS.   No current facility-administered medications on file prior to visit.   Medications Discontinued During This Encounter  Medication Reason   oxyCODONE -acetaminophen  (PERCOCET/ROXICET) 5-325 MG tablet    Oxycodone  HCl 10 MG TABS Reorder      Virtual Physical Exam Physical Exam Breathing appears much better, no visible swelling  General Appearance:  Well Developed, Well Nourished, No Acute Distress by Limited Video Assessment Pulmonary:  No Respiratory Distress Apparent. Normal Work of Breathing.   Neurological:  Awake, Alert. No Obvious Focal Neurological Deficits or Cognitive Impairments.  Sensorium Seems Unclouded. Psychiatric:  Appropriate Mood, Pleasant Demeanor, Calm, Articulate, Good Mood        No results found for any visits on 05/17/23. Hospital Outpatient Visit on 05/01/2023  Component Date Value   Sodium 05/01/2023 139    Potassium 05/01/2023 3.6    Chloride 05/01/2023 108    CO2 05/01/2023 25    Glucose, Bld 05/01/2023 123 (H)    BUN 05/01/2023 14    Creatinine, Ser 05/01/2023 1.30 (H)    Calcium  05/01/2023 8.5 (L)    GFR, Estimated 05/01/2023 56 (L)    Anion gap 05/01/2023 6    B Natriuretic Peptide 05/01/2023 491.3 (H)    WBC 05/01/2023 6.0    RBC 05/01/2023 3.04 (L)    Hemoglobin 05/01/2023 9.0 (L)    HCT 05/01/2023 28.2 (L)    MCV 05/01/2023 92.8    MCH 05/01/2023 29.6    MCHC 05/01/2023 31.9    RDW 05/01/2023 13.7  Platelets 05/01/2023 216    nRBC 05/01/2023 0.0    Iron 05/01/2023 42 (L)    TIBC 05/01/2023 309    Saturation Ratios 05/01/2023 14 (L)    UIBC 05/01/2023 267    Ferritin 05/01/2023  92   Admission on 04/20/2023, Discharged on 04/24/2023  Component Date Value   WBC 04/20/2023 6.6    RBC 04/20/2023 2.89 (L)    Hemoglobin 04/20/2023 8.7 (L)    HCT 04/20/2023 26.3 (L)    MCV 04/20/2023 91.0    MCH 04/20/2023 30.1    MCHC 04/20/2023 33.1    RDW 04/20/2023 14.2    Platelets 04/20/2023 224    nRBC 04/20/2023 0.0    Neutrophils Relative % 04/20/2023 77    Neutro Abs 04/20/2023 5.1    Lymphocytes Relative 04/20/2023 12    Lymphs Abs 04/20/2023 0.8    Monocytes Relative 04/20/2023 8    Monocytes Absolute 04/20/2023 0.5    Eosinophils Relative 04/20/2023 2    Eosinophils Absolute 04/20/2023 0.2    Basophils Relative 04/20/2023 1    Basophils Absolute 04/20/2023 0.0    Immature Granulocytes 04/20/2023 0    Abs Immature Granulocytes 04/20/2023 0.02    Sodium 04/20/2023 137    Potassium 04/20/2023 2.8 (L)    Chloride 04/20/2023 102    CO2 04/20/2023 27    Glucose, Bld 04/20/2023 135 (H)    BUN 04/20/2023 17    Creatinine, Ser 04/20/2023 1.39 (H)    Calcium  04/20/2023 8.1 (L)    GFR, Estimated 04/20/2023 52 (L)    Anion gap 04/20/2023 8    B Natriuretic Peptide 04/20/2023 2,352.1 (H)    Troponin I (High Sensiti* 04/20/2023 79 (H)    SARS Coronavirus 2 by RT* 04/20/2023 NEGATIVE    Influenza A by PCR 04/20/2023 NEGATIVE    Influenza B by PCR 04/20/2023 NEGATIVE    Resp Syncytial Virus by * 04/20/2023 NEGATIVE    Magnesium  04/20/2023 1.9    Troponin I (High Sensiti* 04/20/2023 72 (H)    Sodium 04/21/2023 140    Potassium 04/21/2023 3.3 (L)    Chloride 04/21/2023 105    CO2 04/21/2023 27    Glucose, Bld 04/21/2023 111 (H)    BUN 04/21/2023 17    Creatinine, Ser 04/21/2023 1.56 (H)    Calcium  04/21/2023 8.2 (L)    Total Protein 04/21/2023 6.0 (L)    Albumin 04/21/2023 2.6 (L)    AST 04/21/2023 19    ALT 04/21/2023 7    Alkaline Phosphatase 04/21/2023 84    Total Bilirubin 04/21/2023 0.5    GFR, Estimated 04/21/2023 45 (L)    Anion gap 04/21/2023 8    WBC  04/21/2023 5.8    RBC 04/21/2023 2.92 (L)    Hemoglobin 04/21/2023 8.7 (L)    HCT 04/21/2023 26.6 (L)    MCV 04/21/2023 91.1    MCH 04/21/2023 29.8    MCHC 04/21/2023 32.7    RDW 04/21/2023 14.3    Platelets 04/21/2023 225    nRBC 04/21/2023 0.0    Magnesium  04/21/2023 1.9    Weight 04/21/2023 2,303.37    Height 04/21/2023 71    BP 04/21/2023 168/64    Single Plane A2C EF 04/21/2023 36.9    Single Plane A4C EF 04/21/2023 37.7    Calc EF 04/21/2023 36.1    S' Lateral 04/21/2023 4.00    AR max vel 04/21/2023 2.15    AV Area VTI 04/21/2023 2.32    AV Mean grad 04/21/2023 4.0  AV Peak grad 04/21/2023 7.2    Ao pk vel 04/21/2023 1.34    Area-P 1/2 04/21/2023 4.96    AV Area mean vel 04/21/2023 2.02    MV VTI 04/21/2023 2.63    Est EF 04/21/2023 40 - 45%    Sodium 04/22/2023 140    Potassium 04/22/2023 3.2 (L)    Chloride 04/22/2023 101    CO2 04/22/2023 28    Glucose, Bld 04/22/2023 115 (H)    BUN 04/22/2023 19    Creatinine, Ser 04/22/2023 1.72 (H)    Calcium  04/22/2023 8.6 (L)    GFR, Estimated 04/22/2023 40 (L)    Anion gap 04/22/2023 11    Magnesium  04/22/2023 1.8    Sodium 04/23/2023 139    Potassium 04/23/2023 4.1    Chloride 04/23/2023 104    CO2 04/23/2023 27    Glucose, Bld 04/23/2023 139 (H)    BUN 04/23/2023 25 (H)    Creatinine, Ser 04/23/2023 1.72 (H)    Calcium  04/23/2023 8.5 (L)    GFR, Estimated 04/23/2023 40 (L)    Anion gap 04/23/2023 8    Magnesium  04/23/2023 1.9    Sodium 04/24/2023 138    Potassium 04/24/2023 3.9    Chloride 04/24/2023 104    CO2 04/24/2023 26    Glucose, Bld 04/24/2023 107 (H)    BUN 04/24/2023 21    Creatinine, Ser 04/24/2023 1.77 (H)    Calcium  04/24/2023 8.3 (L)    GFR, Estimated 04/24/2023 39 (L)    Anion gap 04/24/2023 8    Magnesium  04/24/2023 2.0   Office Visit on 02/22/2023  Component Date Value   Sed Rate 02/22/2023 19    CRP 02/22/2023 11.6 (H)    WBC 02/22/2023 6.1    RBC 02/22/2023 3.05 (L)    Hemoglobin  02/22/2023 9.4 (L)    HCT 02/22/2023 29.0 (L)    MCV 02/22/2023 95.1    MCH 02/22/2023 30.8    MCHC 02/22/2023 32.4    RDW 02/22/2023 12.4    Platelets 02/22/2023 166    MPV 02/22/2023 9.8    Neutro Abs 02/22/2023 3,239    Absolute Lymphocytes 02/22/2023 2,129    Absolute Monocytes 02/22/2023 561    Eosinophils Absolute 02/22/2023 153    Basophils Absolute 02/22/2023 18    Neutrophils Relative % 02/22/2023 53.1    Total Lymphocyte 02/22/2023 34.9    Monocytes Relative 02/22/2023 9.2    Eosinophils Relative 02/22/2023 2.5    Basophils Relative 02/22/2023 0.3    Glucose, Bld 02/22/2023 94    BUN 02/22/2023 20    Creat 02/22/2023 1.77 (H)    eGFR 02/22/2023 39 (L)    BUN/Creatinine Ratio 02/22/2023 11    Sodium 02/22/2023 140    Potassium 02/22/2023 3.1 (L)    Chloride 02/22/2023 104    CO2 02/22/2023 26    Calcium  02/22/2023 8.4 (L)    Total Protein 02/22/2023 6.3    Albumin 02/22/2023 3.4 (L)    Globulin 02/22/2023 2.9    AG Ratio 02/22/2023 1.2    Total Bilirubin 02/22/2023 0.3    Alkaline phosphatase (AP* 02/22/2023 92    AST 02/22/2023 21    ALT 02/22/2023 6 (L)   Admission on 02/17/2023, Discharged on 02/17/2023  Component Date Value   Color, Urine 02/17/2023 YELLOW    APPearance 02/17/2023 CLEAR    Specific Gravity, Urine 02/17/2023 1.011    pH 02/17/2023 5.0    Glucose, UA 02/17/2023 NEGATIVE    Hgb urine dipstick 02/17/2023  MODERATE (A)    Bilirubin Urine 02/17/2023 NEGATIVE    Ketones, ur 02/17/2023 NEGATIVE    Protein, ur 02/17/2023 30 (A)    Nitrite 02/17/2023 NEGATIVE    Leukocytes,Ua 02/17/2023 NEGATIVE    RBC / HPF 02/17/2023 0-5    WBC, UA 02/17/2023 0-5    Bacteria, UA 02/17/2023 NONE SEEN    Squamous Epithelial / HPF 02/17/2023 0-5    Mucus 02/17/2023 PRESENT    Glucose-Capillary 02/17/2023 164 (H)    WBC 02/17/2023 5.0    RBC 02/17/2023 2.85 (L)    Hemoglobin 02/17/2023 8.9 (L)    HCT 02/17/2023 27.3 (L)    MCV 02/17/2023 95.8    MCH  02/17/2023 31.2    MCHC 02/17/2023 32.6    RDW 02/17/2023 13.2    Platelets 02/17/2023 141 (L)    nRBC 02/17/2023 0.0    Neutrophils Relative % 02/17/2023 65    Neutro Abs 02/17/2023 3.2    Lymphocytes Relative 02/17/2023 24    Lymphs Abs 02/17/2023 1.2    Monocytes Relative 02/17/2023 10    Monocytes Absolute 02/17/2023 0.5    Eosinophils Relative 02/17/2023 1    Eosinophils Absolute 02/17/2023 0.1    Basophils Relative 02/17/2023 0    Basophils Absolute 02/17/2023 0.0    Immature Granulocytes 02/17/2023 0    Abs Immature Granulocytes 02/17/2023 0.01    Sodium 02/17/2023 139    Potassium 02/17/2023 3.6    Chloride 02/17/2023 105    CO2 02/17/2023 23    Glucose, Bld 02/17/2023 188 (H)    BUN 02/17/2023 26 (H)    Creatinine, Ser 02/17/2023 2.10 (H)    Calcium  02/17/2023 8.5 (L)    Total Protein 02/17/2023 6.3 (L)    Albumin 02/17/2023 3.1 (L)    AST 02/17/2023 32    ALT 02/17/2023 11    Alkaline Phosphatase 02/17/2023 79    Total Bilirubin 02/17/2023 0.5    GFR, Estimated 02/17/2023 32 (L)    Anion gap 02/17/2023 11    Sed Rate 02/17/2023 18 (H)    CRP 02/17/2023 0.8   Office Visit on 01/17/2023  Component Date Value   CRP 01/17/2023 5.6    Sed Rate 01/17/2023 36 (H)    Glucose, Bld 01/17/2023 167 (H)    BUN 01/17/2023 21    Creat 01/17/2023 1.71 (H)    eGFR 01/17/2023 40 (L)    BUN/Creatinine Ratio 01/17/2023 12    Sodium 01/17/2023 141    Potassium 01/17/2023 4.7    Chloride 01/17/2023 107    CO2 01/17/2023 26    Calcium  01/17/2023 8.7    Total Protein 01/17/2023 6.6    Albumin 01/17/2023 3.7    Globulin 01/17/2023 2.9    AG Ratio 01/17/2023 1.3    Total Bilirubin 01/17/2023 0.4    Alkaline phosphatase (AP* 01/17/2023 106    AST 01/17/2023 19    ALT 01/17/2023 6 (L)    WBC 01/17/2023 5.6    RBC 01/17/2023 2.69 (L)    Hemoglobin 01/17/2023 8.3 (L)    HCT 01/17/2023 26.1 (L)    MCV 01/17/2023 97.0    MCH 01/17/2023 30.9    MCHC 01/17/2023 31.8 (L)    RDW  01/17/2023 13.1    Platelets 01/17/2023 181    MPV 01/17/2023 10.4    Neutro Abs 01/17/2023 2,638    Absolute Lymphocytes 01/17/2023 2,050    Absolute Monocytes 01/17/2023 521    Eosinophils Absolute 01/17/2023 353    Basophils Absolute 01/17/2023 39  Neutrophils Relative % 01/17/2023 47.1    Total Lymphocyte 01/17/2023 36.6    Monocytes Relative 01/17/2023 9.3    Eosinophils Relative 01/17/2023 6.3    Basophils Relative 01/17/2023 0.7   Lab on 01/02/2023  Component Date Value   WBC 01/02/2023 6.1    RBC 01/02/2023 2.56 (L)    Hemoglobin 01/02/2023 7.9 (L)    HCT 01/02/2023 25.2 (L)    MCV 01/02/2023 98.4    MCH 01/02/2023 30.9    MCHC 01/02/2023 31.3    RDW 01/02/2023 14.1    Platelets 01/02/2023 214    nRBC 01/02/2023 0.0    Neutrophils Relative % 01/02/2023 58    Neutro Abs 01/02/2023 3.5    Lymphocytes Relative 01/02/2023 29    Lymphs Abs 01/02/2023 1.7    Monocytes Relative 01/02/2023 10    Monocytes Absolute 01/02/2023 0.6    Eosinophils Relative 01/02/2023 3    Eosinophils Absolute 01/02/2023 0.2    Basophils Relative 01/02/2023 0    Basophils Absolute 01/02/2023 0.0    Immature Granulocytes 01/02/2023 0    Abs Immature Granulocytes 01/02/2023 0.02   Office Visit on 12/28/2022  Component Date Value   CRP 12/28/2022 32.2 (H)    Sed Rate 12/28/2022 67 (H)    Glucose, Bld 12/28/2022 135 (H)    BUN 12/28/2022 27 (H)    Creat 12/28/2022 1.79 (H)    eGFR 12/28/2022 38 (L)    BUN/Creatinine Ratio 12/28/2022 15    Sodium 12/28/2022 139    Potassium 12/28/2022 5.1    Chloride 12/28/2022 113 (H)    CO2 12/28/2022 18 (L)    Calcium  12/28/2022 8.5 (L)    Total Protein 12/28/2022 6.7    Albumin 12/28/2022 3.5 (L)    Globulin 12/28/2022 3.2    AG Ratio 12/28/2022 1.1    Total Bilirubin 12/28/2022 0.2    Alkaline phosphatase (AP* 12/28/2022 118    AST 12/28/2022 12    ALT 12/28/2022 <3 (L)    WBC 12/28/2022 5.6    RBC 12/28/2022 2.30 (L)    Hemoglobin 12/28/2022  7.1 (L)    HCT 12/28/2022 22.2 (L)    MCV 12/28/2022 96.5    MCH 12/28/2022 30.9    MCHC 12/28/2022 32.0    RDW 12/28/2022 13.1    Platelets 12/28/2022 205    MPV 12/28/2022 9.4    Neutro Abs 12/28/2022 3,875    Absolute Lymphocytes 12/28/2022 1,042    Absolute Monocytes 12/28/2022 465    Eosinophils Absolute 12/28/2022 196    Basophils Absolute 12/28/2022 22    Neutrophils Relative % 12/28/2022 69.2    Total Lymphocyte 12/28/2022 18.6    Monocytes Relative 12/28/2022 8.3    Eosinophils Relative 12/28/2022 3.5    Basophils Relative 12/28/2022 0.4   No results displayed because visit has over 200 results.    Admission on 10/27/2022, Discharged on 11/02/2022  Component Date Value   WBC 10/27/2022 5.4    RBC 10/27/2022 3.37 (L)    Hemoglobin 10/27/2022 10.5 (L)    HCT 10/27/2022 30.7 (L)    MCV 10/27/2022 91.1    MCH 10/27/2022 31.2    MCHC 10/27/2022 34.2    RDW 10/27/2022 13.2    Platelets 10/27/2022 133 (L)    nRBC 10/27/2022 0.0    Color, Urine 10/27/2022 YELLOW    APPearance 10/27/2022 CLEAR    Specific Gravity, Urine 10/27/2022 1.010    pH 10/27/2022 6.0    Glucose, UA 10/27/2022 NEGATIVE    Hgb urine dipstick  10/27/2022 LARGE (A)    Bilirubin Urine 10/27/2022 NEGATIVE    Ketones, ur 10/27/2022 NEGATIVE    Protein, ur 10/27/2022 100 (A)    Nitrite 10/27/2022 NEGATIVE    Leukocytes,Ua 10/27/2022 NEGATIVE    Sodium 10/27/2022 134 (L)    Potassium 10/27/2022 2.1 (LL)    Chloride 10/27/2022 99    CO2 10/27/2022 24    Glucose, Bld 10/27/2022 126 (H)    BUN 10/27/2022 26 (H)    Creatinine, Ser 10/27/2022 4.21 (H)    Calcium  10/27/2022 7.4 (L)    Total Protein 10/27/2022 6.5    Albumin 10/27/2022 2.9 (L)    AST 10/27/2022 126 (H)    ALT 10/27/2022 49 (H)    Alkaline Phosphatase 10/27/2022 153 (H)    Total Bilirubin 10/27/2022 0.2 (L)    GFR, Estimated 10/27/2022 14 (L)    Anion gap 10/27/2022 11    Magnesium  10/27/2022 1.9    RBC / HPF 10/27/2022 11-20    WBC,  UA 10/27/2022 0-5    Bacteria, UA 10/27/2022 NONE SEEN    Squamous Epithelial / HPF 10/27/2022 0-5    Total CK 10/27/2022 2,828 (H)    Sodium 10/28/2022 138    Potassium 10/28/2022 2.6 (LL)    Chloride 10/28/2022 104    CO2 10/28/2022 20 (L)    Glucose, Bld 10/28/2022 82    BUN 10/28/2022 25 (H)    Creatinine, Ser 10/28/2022 3.62 (H)    Calcium  10/28/2022 7.6 (L)    GFR, Estimated 10/28/2022 17 (L)    Anion gap 10/28/2022 14    Magnesium  10/28/2022 2.5 (H)    Phosphorus 10/28/2022 3.7    Total CK 10/28/2022 2,043 (H)    Total Protein 10/28/2022 5.5 (L)    Albumin 10/28/2022 2.4 (L)    AST 10/28/2022 95 (H)    ALT 10/28/2022 39    Alkaline Phosphatase 10/28/2022 134 (H)    Total Bilirubin 10/28/2022 0.5    Bilirubin, Direct 10/28/2022 0.2    Indirect Bilirubin 10/28/2022 0.3    Vitamin B-12 10/28/2022 676    Sodium 10/29/2022 142    Potassium 10/29/2022 3.2 (L)    Chloride 10/29/2022 106    CO2 10/29/2022 24    Glucose, Bld 10/29/2022 147 (H)    BUN 10/29/2022 24 (H)    Creatinine, Ser 10/29/2022 3.19 (H)    Calcium  10/29/2022 7.8 (L)    GFR, Estimated 10/29/2022 19 (L)    Anion gap 10/29/2022 12    Total CK 10/29/2022 992 (H)    Folate 10/30/2022 13.1    Iron 10/30/2022 46    TIBC 10/30/2022 223 (L)    Saturation Ratios 10/30/2022 21    UIBC 10/30/2022 177    Ferritin 10/30/2022 224    Retic Ct Pct 10/30/2022 0.6    RBC. 10/30/2022 3.41 (L)    Retic Count, Absolute 10/30/2022 19.8    Immature Retic Fract 10/30/2022 1.8 (L)    Sodium 10/30/2022 143    Potassium 10/30/2022 3.6    Chloride 10/30/2022 101    CO2 10/30/2022 27    Glucose, Bld 10/30/2022 183 (H)    BUN 10/30/2022 17    Creatinine, Ser 10/30/2022 2.28 (H)    Calcium  10/30/2022 8.2 (L)    GFR, Estimated 10/30/2022 29 (L)    Anion gap 10/30/2022 15    Total CK 10/30/2022 802 (H)    WBC 10/31/2022 4.5    RBC 10/31/2022 3.53 (L)    Hemoglobin 10/31/2022 11.3 (L)  HCT 10/31/2022 32.5 (L)    MCV  10/31/2022 92.1    MCH 10/31/2022 32.0    MCHC 10/31/2022 34.8    RDW 10/31/2022 13.1    Platelets 10/31/2022 144 (L)    nRBC 10/31/2022 0.0    Sodium 10/31/2022 140    Potassium 10/31/2022 3.6    Chloride 10/31/2022 104    CO2 10/31/2022 28    Glucose, Bld 10/31/2022 124 (H)    BUN 10/31/2022 13    Creatinine, Ser 10/31/2022 2.13 (H)    Calcium  10/31/2022 8.1 (L)    Total Protein 10/31/2022 5.8 (L)    Albumin 10/31/2022 2.5 (L)    AST 10/31/2022 114 (H)    ALT 10/31/2022 37    Alkaline Phosphatase 10/31/2022 130 (H)    Total Bilirubin 10/31/2022 0.8    GFR, Estimated 10/31/2022 31 (L)    Anion gap 10/31/2022 8    Total CK 10/31/2022 818 (H)    Total CK 11/01/2022 482 (H)    Sodium 11/01/2022 139    Potassium 11/01/2022 2.6 (LL)    Chloride 11/01/2022 98    CO2 11/01/2022 28    Glucose, Bld 11/01/2022 128 (H)    BUN 11/01/2022 11    Creatinine, Ser 11/01/2022 1.65 (H)    Calcium  11/01/2022 8.0 (L)    Total Protein 11/01/2022 5.6 (L)    Albumin 11/01/2022 2.4 (L)    AST 11/01/2022 146 (H)    ALT 11/01/2022 43    Alkaline Phosphatase 11/01/2022 121    Total Bilirubin 11/01/2022 0.2 (L)    GFR, Estimated 11/01/2022 43 (L)    Anion gap 11/01/2022 13    Magnesium  11/01/2022 1.3 (L)    Potassium 11/01/2022 2.7 (LL)    Magnesium  11/01/2022 2.4    Potassium 11/01/2022 3.6    Sodium 11/02/2022 138    Potassium 11/02/2022 3.1 (L)    Chloride 11/02/2022 104    CO2 11/02/2022 25    Glucose, Bld 11/02/2022 195 (H)    BUN 11/02/2022 9    Creatinine, Ser 11/02/2022 1.61 (H)    Calcium  11/02/2022 7.9 (L)    Total Protein 11/02/2022 5.7 (L)    Albumin 11/02/2022 2.3 (L)    AST 11/02/2022 84 (H)    ALT 11/02/2022 37    Alkaline Phosphatase 11/02/2022 117    Total Bilirubin 11/02/2022 0.3    GFR, Estimated 11/02/2022 44 (L)    Anion gap 11/02/2022 9   No image results found. DG Chest 2 View Result Date: 04/24/2023 CLINICAL DATA:  Shortness of breath.  CHF. EXAM: CHEST - 2  VIEW COMPARISON:  Chest radiograph dated 04/20/2023. FINDINGS: Background of emphysema. There is blunting of the costophrenic angles which may be chronic and scarring or represent trace pleural effusions. Significant improvement in aeration of the lungs. No consolidative changes or pneumothorax. The cardiac silhouette is within limits. Atherosclerotic calcification of the aorta. No acute osseous pathology. IMPRESSION: 1. Significant improvement in aeration of the lungs. 2. Small bilateral pleural effusions. 3. Emphysema. Electronically Signed   By: Angus Bark M.D.   On: 04/24/2023 14:22   ECHOCARDIOGRAM COMPLETE Result Date: 04/21/2023    ECHOCARDIOGRAM REPORT   Patient Name:   Michael Bender Date of Exam: 04/21/2023 Medical Rec #:  [id number]        Height:       71.0 in Accession #:    [id number]       Weight:       144.0 lb Date of Birth:  August 12, 1945       BSA:          1.834 m Patient Age:    77 years         BP:           168/64 mmHg Patient Gender: M                HR:           94 bpm. Exam Location:  Inpatient Procedure: 2D Echo, Cardiac Doppler, Color Doppler and Intracardiac            Opacification Agent (Both Spectral and Color Flow Doppler were            utilized during procedure). Indications:    Dyspnea  History:        Patient has prior history of Echocardiogram examinations, most                 recent 11/07/2022. COPD; Risk Factors:Hypertension, Dyslipidemia                 and Current Smoker.  Sonographer:    Reta Cassis Referring Phys: [id number] PAULA V ROSS  Sonographer Comments: Technically difficult study due to poor echo windows. Image acquisition challenging due to patient body habitus, Image acquisition challenging due to COPD and Image acquisition challenging due to respiratory motion. IMPRESSIONS  1. Left ventricular ejection fraction, by estimation, is 40 to 45%. The left ventricle has mildly decreased function. The left ventricle demonstrates global hypokinesis. There is mild left  ventricular hypertrophy. Left ventricular diastolic parameters are consistent with Grade I diastolic dysfunction (impaired relaxation).  2. Right ventricular systolic function is normal. The right ventricular size is normal. There is mildly elevated pulmonary artery systolic pressure. The estimated right ventricular systolic pressure is 38.0 mmHg.  3. Moderate pleural effusion in both left and right lateral regions.  4. The mitral valve is abnormal. Trivial mitral valve regurgitation.  5. The aortic valve is tricuspid. Aortic valve regurgitation is trivial. Aortic valve sclerosis/calcification is present, without any evidence of aortic stenosis.  6. The inferior vena cava is normal in size with greater than 50% respiratory variability, suggesting right atrial pressure of 3 mmHg. Comparison(s): Changes from prior study are noted. 11/07/2022: LVEF 60-65%. FINDINGS  Left Ventricle: Left ventricular ejection fraction, by estimation, is 40 to 45%. The left ventricle has mildly decreased function. The left ventricle demonstrates global hypokinesis. Definity  contrast agent was given IV to delineate the left ventricular  endocardial borders. The left ventricular internal cavity size was normal in size. There is mild left ventricular hypertrophy. Left ventricular diastolic parameters are consistent with Grade I diastolic dysfunction (impaired relaxation). Indeterminate filling pressures. Right Ventricle: The right ventricular size is normal. No increase in right ventricular wall thickness. Right ventricular systolic function is normal. There is mildly elevated pulmonary artery systolic pressure. The tricuspid regurgitant velocity is 2.96  m/s, and with an assumed right atrial pressure of 3 mmHg, the estimated right ventricular systolic pressure is 38.0 mmHg. Left Atrium: Left atrial size was normal in size. Right Atrium: Right atrial size was normal in size. Pericardium: There is no evidence of pericardial effusion. Mitral  Valve: The mitral valve is abnormal. Mild mitral annular calcification. Trivial mitral valve regurgitation. MV peak gradient, 5.8 mmHg. The mean mitral valve gradient is 3.0 mmHg. Tricuspid Valve: The tricuspid valve is grossly normal. Tricuspid valve regurgitation is mild. Aortic Valve: The aortic valve is tricuspid. Aortic valve regurgitation is trivial. Aortic  valve sclerosis/calcification is present, without any evidence of aortic stenosis. Aortic valve mean gradient measures 4.0 mmHg. Aortic valve peak gradient measures 7.2 mmHg. Aortic valve area, by VTI measures 2.32 cm. Pulmonic Valve: The pulmonic valve was normal in structure. Pulmonic valve regurgitation is not visualized. Aorta: The aortic root and ascending aorta are structurally normal, with no evidence of dilitation. Venous: The inferior vena cava is normal in size with greater than 50% respiratory variability, suggesting right atrial pressure of 3 mmHg. IAS/Shunts: No atrial level shunt detected by color flow Doppler. Additional Comments: There is a moderate pleural effusion in both left and right lateral regions.  LEFT VENTRICLE PLAX 2D LVIDd:         5.10 cm      Diastology LVIDs:         4.00 cm      LV e' medial:    7.72 cm/s LV PW:         1.20 cm      LV E/e' medial:  9.9 LV IVS:        1.10 cm      LV e' lateral:   5.98 cm/s LVOT diam:     2.00 cm      LV E/e' lateral: 12.8 LV SV:         56 LV SV Index:   31 LVOT Area:     3.14 cm  LV Volumes (MOD) LV vol d, MOD A2C: 140.5 ml LV vol d, MOD A4C: 167.0 ml LV vol s, MOD A2C: 88.6 ml LV vol s, MOD A4C: 104.0 ml LV SV MOD A2C:     51.9 ml LV SV MOD A4C:     167.0 ml LV SV MOD BP:      57.7 ml RIGHT VENTRICLE             IVC RV Basal diam:  3.70 cm     IVC diam: 1.90 cm RV S prime:     14.60 cm/s TAPSE (M-mode): 2.8 cm LEFT ATRIUM             Index        RIGHT ATRIUM           Index LA diam:        4.70 cm 2.56 cm/m   RA Area:     14.30 cm LA Vol (A2C):   62.1 ml 33.87 ml/m  RA Volume:    37.50 ml  20.45 ml/m LA Vol (A4C):   40.8 ml 22.25 ml/m LA Biplane Vol: 50.7 ml 27.65 ml/m  AORTIC VALVE AV Area (Vmax):    2.15 cm AV Area (Vmean):   2.02 cm AV Area (VTI):     2.32 cm AV Vmax:           134.00 cm/s AV Vmean:          92.800 cm/s AV VTI:            0.242 m AV Peak Grad:      7.2 mmHg AV Mean Grad:      4.0 mmHg LVOT Vmax:         91.50 cm/s LVOT Vmean:        59.800 cm/s LVOT VTI:          0.179 m LVOT/AV VTI ratio: 0.74  AORTA Ao Root diam: 3.30 cm MITRAL VALVE                TRICUSPID VALVE MV Area (PHT): 4.96  cm     TR Peak grad:   35.0 mmHg MV Area VTI:   2.63 cm     TR Vmax:        296.00 cm/s MV Peak grad:  5.8 mmHg MV Mean grad:  3.0 mmHg     SHUNTS MV Vmax:       1.20 m/s     Systemic VTI:  0.18 m MV Vmean:      76.6 cm/s    Systemic Diam: 2.00 cm MV Decel Time: 153 msec MV E velocity: 76.60 cm/s MV A velocity: 111.00 cm/s MV E/A ratio:  0.69 Dinah Franco MD Electronically signed by Dinah Franco MD Signature Date/Time: 04/21/2023/5:07:36 PM    Final    DG Chest Port 1 View Result Date: 04/20/2023 CLINICAL DATA:  Shortness of breath. EXAM: PORTABLE CHEST 1 VIEW COMPARISON:  Chest radiograph dated 11/05/2022. FINDINGS: Cardiomegaly with vascular congestion. Small bilateral flu shin is and bibasilar atelectasis, left greater than right. Pneumonia is not excluded. No pneumothorax. Atherosclerotic calcification of the aorta. No acute osseous pathology. IMPRESSION: 1. Cardiomegaly with vascular congestion. 2. Small bilateral pleural effusions and bibasilar atelectasis. Electronically Signed   By: Angus Bark M.D.   On: 04/20/2023 10:32  No results found.     Assessment & Plan Chronic bilateral low back pain with bilateral sciatica Medication Error - Oxycodone    He was mistakenly prescribed oxycodone  with acetaminophen  instead of his usual oxycodone  10 mg without acetaminophen . The error was identified and discussed. He prefers to return to the previous formulation. It was  explained that taking two oxycodone  5 mg with acetaminophen  every eight hours equates to the same oxycodone  dosage but with additional acetaminophen , which is not preferred. Refill oxycodone  10 mg without acetaminophen , 1 tablet every 8 hours as needed for pain, and instruct the pharmacy to fill the correct prescription as soon as possible. Anemia, unspecified type He is scheduled for an iron infusion on Friday due to suspected GI bleeding, given his history of a GI bleed in 2021 and current low iron levels. Despite no visible blood in stool, a stress ulcer is suspected. Further GI evaluation is necessary if anemia worsens despite the infusion. Administer the iron infusion as scheduled and order a blood count in two weeks to assess anemia status. He should monitor for signs of GI bleeding, including visible blood in stool, and visit the ER if significant GI bleeding or symptoms of severe anemia occur. Continue pantoprazole  to protect against GI bleeding and schedule a GI evaluation with Dr. Willy Harvest. Stage 3 chronic kidney disease, unspecified whether stage 3a or 3b CKD (HCC) Kidney function is being monitored, especially in the context of potential GI bleeding and anemia, which could affect kidney function. The cardiovascular center is also monitoring. His kidneys are sensitive to blood loss and dehydration, potentially exacerbated by a GI bleed. Recheck kidney function in two weeks along with the blood count and coordinate with the cardiovascular center regarding kidney function monitoring. Congestive heart failure with left ventricular diastolic dysfunction, acute on chronic (HCC) A recent episode resolved, suspected to be precipitated by anemia due to GI bleeding. Currently, there are no signs of fluid overload or dyspnea. It was explained that heart failure could have been caused by thickened blood due to anemia, now resolved. Monitor for signs of heart failure recurrence and continue the current heart  failure management plan. General Health Maintenance   He is not currently smoking, a positive health behavior. His blood pressure has normalized,  possibly due to medication adjustments by the cardiovascular center. Encourage continued smoking cessation.  Follow-up   Multiple follow-up appointments are scheduled, including the iron infusion and cardiovascular follow-up. Plan to reassess his condition after appointments and scheduled blood work. He is advised to wait to fill the Blink Rx prescription until after the iron infusion to determine necessity. Follow up with blood work in two weeks, schedule a follow-up appointment after blood work, coordinate with the cardiovascular center for follow-up care, and ensure medications are picked up from the pharmacy.      Orders Placed During this Encounter:   Orders Placed This Encounter  Procedures   CBC with Differential/Platelet    Standing Status:   Future    Expected Date:   05/31/2023    Expiration Date:   05/16/2024    Release to patient:   Immediate [1]   Comp Met (CMET)   Meds ordered this encounter  Medications   Oxycodone  HCl 10 MG TABS    Sig: Take 1 tablet (10 mg total) by mouth every 8 (eight) hours as needed.    Dispense:  90 tablet    Refill:  0    Ok to fill now    Treatment plan discussed and reviewed in detail. Explained medication safety and potential side effects.  Answered all patient questions and confirmed understanding and comfort with the plan. Encouraged patient to contact our office if they have any questions or concerns.  Agreed on patient coming for a sooner office visit if symptoms worsen, persist, or new symptoms develop. Discussed precautions in case of needing to visit the Emergency Department.    ----------------------------------------------------- Attestation:  Today's Healthcare Provider Anthon Kins, MD was located at office at [facility] at [facility] [address] [place], [place]  [address].  The patient was located at home. All video encounter participant identities and locations confirmed visually and verbally.Today's Telemedicine visit was conducted via synchronous Video after consent for telemedicine was obtained:  Video connection was never lost    This document was transcribed and resynthesized, in part, by artificial intelligence ([employer]bridge) using HIPAA-compliant recording of the clinical interaction;   We have discussed the our use of AI scribe software for clinical note transcription with the patient, who has given verbal consent to proceed.

## 2023-05-18 ENCOUNTER — Other Ambulatory Visit (HOSPITAL_COMMUNITY): Payer: Self-pay | Admitting: *Deleted

## 2023-05-18 ENCOUNTER — Telehealth (HOSPITAL_COMMUNITY): Payer: Self-pay

## 2023-05-18 NOTE — Telephone Encounter (Signed)
 Called to confirm/remind patient of their appointment at [facility] on 05/19/2023 10:15.   Appointment:   [x] Confirmed  [] Left mess   [] No answer/No voice mail  [] VM Full/unable to leave message  [] Phone not in service  Patient reminded to bring all medications and/or complete list.  Confirmed patient has transportation. Gave directions, instructed to utilize valet parking.

## 2023-05-19 ENCOUNTER — Ambulatory Visit (HOSPITAL_COMMUNITY)
Admission: RE | Admit: 2023-05-19 | Discharge: 2023-05-19 | Disposition: A | Discharge status: Home / Self Care | Source: Ambulatory Visit | Attending: Cardiology

## 2023-05-19 ENCOUNTER — Telehealth (HOSPITAL_COMMUNITY): Payer: Self-pay

## 2023-05-19 ENCOUNTER — Encounter: Payer: Self-pay | Admitting: Internal Medicine

## 2023-05-19 ENCOUNTER — Ambulatory Visit (HOSPITAL_COMMUNITY)
Admission: RE | Admit: 2023-05-19 | Discharge: 2023-05-19 | Disposition: A | Discharge status: Home / Self Care | Source: Ambulatory Visit | Attending: Cardiology | Admitting: Cardiology

## 2023-05-19 ENCOUNTER — Other Ambulatory Visit (HOSPITAL_COMMUNITY): Payer: Self-pay

## 2023-05-19 VITALS — BP 164/70 | HR 77 | Ht 71.0 in | Wt 153.6 lb

## 2023-05-19 DIAGNOSIS — N1831 Chronic kidney disease, stage 3a: Secondary | ICD-10-CM

## 2023-05-19 DIAGNOSIS — Z87891 Personal history of nicotine dependence: Secondary | ICD-10-CM | POA: Diagnosis not present

## 2023-05-19 DIAGNOSIS — Z79899 Other long term (current) drug therapy: Secondary | ICD-10-CM | POA: Diagnosis not present

## 2023-05-19 DIAGNOSIS — N183 Chronic kidney disease, stage 3 unspecified: Secondary | ICD-10-CM | POA: Diagnosis not present

## 2023-05-19 DIAGNOSIS — I130 Hypertensive heart and chronic kidney disease with heart failure and stage 1 through stage 4 chronic kidney disease, or unspecified chronic kidney disease: Secondary | ICD-10-CM | POA: Insufficient documentation

## 2023-05-19 DIAGNOSIS — D631 Anemia in chronic kidney disease: Secondary | ICD-10-CM | POA: Diagnosis not present

## 2023-05-19 DIAGNOSIS — D509 Iron deficiency anemia, unspecified: Secondary | ICD-10-CM

## 2023-05-19 DIAGNOSIS — I4891 Unspecified atrial fibrillation: Secondary | ICD-10-CM

## 2023-05-19 DIAGNOSIS — I5022 Chronic systolic (congestive) heart failure: Secondary | ICD-10-CM

## 2023-05-19 DIAGNOSIS — J439 Emphysema, unspecified: Secondary | ICD-10-CM | POA: Insufficient documentation

## 2023-05-19 DIAGNOSIS — I10 Essential (primary) hypertension: Secondary | ICD-10-CM

## 2023-05-19 DIAGNOSIS — I5033 Acute on chronic diastolic (congestive) heart failure: Secondary | ICD-10-CM

## 2023-05-19 LAB — BASIC METABOLIC PANEL WITH GFR
Anion gap: 9 (ref 5–15)
BUN: 12 mg/dL (ref 8–23)
CO2: 24 mmol/L (ref 22–32)
Calcium: 8.4 mg/dL — ABNORMAL LOW (ref 8.9–10.3)
Chloride: 103 mmol/L (ref 98–111)
Creatinine, Ser: 1.21 mg/dL (ref 0.61–1.24)
GFR, Estimated: >60 mL/min (ref >60)
Glucose, Bld: 125 mg/dL — ABNORMAL HIGH (ref 70–99)
Potassium: 3.4 mmol/L — ABNORMAL LOW (ref 3.5–5.1)
Sodium: 136 mmol/L (ref 135–145)

## 2023-05-19 LAB — BRAIN NATRIURETIC PEPTIDE: B Natriuretic Peptide: 669.2 pg/mL — ABNORMAL HIGH (ref 0.0–100.0)

## 2023-05-19 MED ORDER — FERUMOXYTOL INJECTION 510 MG/17 ML
510.0000 mg | Freq: Once | INTRAVENOUS | Status: AC
  Administered 2023-05-19: 510 mg via INTRAVENOUS
  Filled 2023-05-19: qty 510

## 2023-05-19 NOTE — Patient Instructions (Signed)
 No change in medication today. Please call us  or send in MyChart a current list of medications that you are taking. Labs today - will call you if abnormal. Follow up with General Cardiology. Please call us  at [phone] if any questions or concerns.

## 2023-05-19 NOTE — Telephone Encounter (Signed)
 Returned call to patients son to confirm medications for patient   Michael Bender through medications on the phone with patient son- and reviewed each one and dosage.   Medication list is correct as listed in chart.   The only difference is that patient has not started the Ferric Maltol - as they are waiting from iron infusion to see if this is needed by PCP.   Will forward to provider.

## 2023-05-19 NOTE — Assessment & Plan Note (Signed)
 A recent episode resolved, suspected to be precipitated by anemia due to GI bleeding. Currently, there are no signs of fluid overload or dyspnea. It was explained that heart failure could have been caused by thickened blood due to anemia, now resolved. Monitor for signs of heart failure recurrence and continue the current heart failure management plan.

## 2023-05-19 NOTE — Assessment & Plan Note (Signed)
 Medication Error - Oxycodone    He was mistakenly prescribed oxycodone  with acetaminophen  instead of his usual oxycodone  10 mg without acetaminophen . The error was identified and discussed. He prefers to return to the previous formulation. It was explained that taking two oxycodone  5 mg with acetaminophen  every eight hours equates to the same oxycodone  dosage but with additional acetaminophen , which is not preferred. Refill oxycodone  10 mg without acetaminophen , 1 tablet every 8 hours as needed for pain, and instruct the pharmacy to fill the correct prescription as soon as possible.

## 2023-05-19 NOTE — Assessment & Plan Note (Signed)
 Kidney function is being monitored, especially in the context of potential GI bleeding and anemia, which could affect kidney function. The cardiovascular center is also monitoring. His kidneys are sensitive to blood loss and dehydration, potentially exacerbated by a GI bleed. Recheck kidney function in two weeks along with the blood count and coordinate with the cardiovascular center regarding kidney function monitoring.

## 2023-05-19 NOTE — Assessment & Plan Note (Signed)
 He is scheduled for an iron infusion on Friday due to suspected GI bleeding, given his history of a GI bleed in 2021 and current low iron levels. Despite no visible blood in stool, a stress ulcer is suspected. Further GI evaluation is necessary if anemia worsens despite the infusion. Administer the iron infusion as scheduled and order a blood count in two weeks to assess anemia status. He should monitor for signs of GI bleeding, including visible blood in stool, and visit the ER if significant GI bleeding or symptoms of severe anemia occur. Continue pantoprazole  to protect against GI bleeding and schedule a GI evaluation with Dr. Willy Harvest.

## 2023-05-19 NOTE — Telephone Encounter (Signed)
 Attempted to return call to patients son, left message to call back to office.

## 2023-05-19 NOTE — Telephone Encounter (Signed)
 Thanks. Would stop losartan  and start entresto 49/51 mg BID + spiro 12.5 mg daily. Please add appointment note to encounter with cards 05/07 to see if BMET can be repeated.

## 2023-05-24 ENCOUNTER — Ambulatory Visit: Payer: Medicare HMO | Admitting: Infectious Disease

## 2023-05-25 ENCOUNTER — Telehealth: Payer: Self-pay | Admitting: *Deleted

## 2023-05-25 ENCOUNTER — Telehealth (HOSPITAL_COMMUNITY): Payer: Self-pay

## 2023-05-25 DIAGNOSIS — M961 Postlaminectomy syndrome, not elsewhere classified: Secondary | ICD-10-CM | POA: Diagnosis not present

## 2023-05-25 DIAGNOSIS — G8929 Other chronic pain: Secondary | ICD-10-CM | POA: Diagnosis not present

## 2023-05-25 NOTE — Telephone Encounter (Signed)
 Spoke with son, [name] and he stated that he would call Hematology office to get patient scheduled, he believe they tried to call his brother.

## 2023-05-25 NOTE — Telephone Encounter (Signed)
-----   Message from Nurse Emer C sent at 05/01/2023 11:07 AM EDT ----- Regarding: PA  Test: MRI  Insurance: [employer]  CPT:   Location: [facility]  Dx: HF  Provider: [place]  Scheduled Date:

## 2023-06-02 ENCOUNTER — Encounter (HOSPITAL_COMMUNITY): Payer: Self-pay

## 2023-06-06 ENCOUNTER — Encounter (HOSPITAL_COMMUNITY): Payer: Self-pay | Admitting: Family Medicine

## 2023-06-06 ENCOUNTER — Ambulatory Visit (HOSPITAL_COMMUNITY)
Admission: RE | Admit: 2023-06-06 | Discharge: 2023-06-06 | Disposition: A | Discharge status: Home / Self Care | Source: Ambulatory Visit | Attending: Family Medicine | Admitting: Family Medicine

## 2023-06-06 ENCOUNTER — Other Ambulatory Visit (HOSPITAL_COMMUNITY): Payer: Self-pay | Admitting: Family Medicine

## 2023-06-06 DIAGNOSIS — I5022 Chronic systolic (congestive) heart failure: Secondary | ICD-10-CM | POA: Diagnosis present

## 2023-06-06 MED ORDER — GADOBUTROL 1 MMOL/ML IV SOLN
10.0000 mL | Freq: Once | INTRAVENOUS | Status: AC | PRN
  Administered 2023-06-06: 10 mL via INTRAVENOUS

## 2023-06-07 ENCOUNTER — Encounter: Payer: Self-pay | Admitting: Emergency Medicine

## 2023-06-07 ENCOUNTER — Ambulatory Visit: Attending: Emergency Medicine | Admitting: Emergency Medicine

## 2023-06-07 VITALS — BP 164/74 | HR 105 | Ht 71.0 in | Wt 149.0 lb

## 2023-06-07 DIAGNOSIS — N183 Chronic kidney disease, stage 3 unspecified: Secondary | ICD-10-CM | POA: Diagnosis not present

## 2023-06-07 DIAGNOSIS — I480 Paroxysmal atrial fibrillation: Secondary | ICD-10-CM

## 2023-06-07 DIAGNOSIS — D509 Iron deficiency anemia, unspecified: Secondary | ICD-10-CM

## 2023-06-07 DIAGNOSIS — I5022 Chronic systolic (congestive) heart failure: Secondary | ICD-10-CM

## 2023-06-07 DIAGNOSIS — I10 Essential (primary) hypertension: Secondary | ICD-10-CM

## 2023-06-07 DIAGNOSIS — I5033 Acute on chronic diastolic (congestive) heart failure: Secondary | ICD-10-CM | POA: Diagnosis not present

## 2023-06-07 DIAGNOSIS — E785 Hyperlipidemia, unspecified: Secondary | ICD-10-CM | POA: Diagnosis not present

## 2023-06-07 MED ORDER — SPIRONOLACTONE 25 MG PO TABS
12.5000 mg | ORAL_TABLET | Freq: Every day | ORAL | 2 refills | Status: DC
Start: 2023-06-07 — End: 2023-06-16

## 2023-06-07 MED ORDER — ENTRESTO 49-51 MG PO TABS: 1.0000 | ORAL_TABLET | Freq: Two times a day (BID) | ORAL | 2 refills | Status: DC

## 2023-06-07 NOTE — Progress Notes (Signed)
 Cardiology Office Note:    Date:  06/07/2023  ID:  Michael Bender, DOB 01/10/1946, MRN [id number] PCP: Anthon Kins, MD  [facility] Providers Cardiologist:  Ola Berger, MD       Patient Profile:      Chief Complaint: Hospital follow-up for acute systolic/diastolic CHF History of Present Illness:  Michael Bender is a 78 y.o. male with visit-pertinent history of hypertension, hyperlipidemia, diastolic dysfunction, orthostatic hypotension with recurrent syncope, paroxysmal atrial fibrillation, rhabdomyolysis, BPH, CVA, PAD, emphysema, GERD, DJD, HFmrEF  Echocardiogram 11/2022 showed LVEF 60 to 65%.  He had recent admission on 11/2022 for falls, weakness, and rhabdomyolysis and ultimately diagnosed with MSSA bacteremia.  Of note he did have new onset A-fib with RVR during this admission and was started on IV diltiazem  and spontaneously converted to NSR.  It was noted he was not a candidate for anticoagulation due to his recurrent falls.  He was discharged to skilled nursing facility but then brought back with sepsis due to MSSA bacteremia on 02/2023.  Current 6 weeks of IV antibiotics and was followed by infectious disease.  Recently admitted on 04/2023 with acute on chronic heart failure.  Echocardiogram showed LVEF 40 to 45%, grade 1 diastolic dysfunction, normal RV.  He was diuresed with IV Lasix .  Had AKI so cardiac cath was deferred.  GDMT was titrated and he was discharged home at weight 136 LBS.  He followed up with advanced heart failure clinic on 05/01/2023.  It is unclear the etiology for the reduction in his EF.  Could be related to recent MSSA sepsis versus uncontrolled hypertension.  He was NYHA class II and volume status okay during clinic visit.  He was started on losartan  25 mg daily.  He is continued on hydralazine  50 mg 3 times daily and Imdur  30 mg daily.  SGLT2i was avoided due to concern for hygiene and volume depletion.  Cardiac MRI was ordered to evaluate for  infiltrative disease and is currently pending.  Was seen for follow-up with advanced heart failure clinic on 05/19/2023.  He continued with NYHA class II symptoms.  His volume status was stable.  Medication management was continued.    Discussed the use of AI scribe software for clinical note transcription with the patient, who gave verbal consent to proceed.  History of Present Illness Michael Bender is a 78 year old male with heart failure who presents for follow-up after recent hospitalization.  Unfortunately his cardiac MRI is currently pending and he is unable to be reviewed during office visit today.  Today patient feels well overall and is without any acute cardiovascular concerns or complaints.  He currently has no symptoms of shortness of breath, orthopnea, PND, leg swelling, or chest pain. His weight remains stable around 150 pounds, with fluctuations within three pounds.  He does take his weight daily.  There are no signs of fluid overload.  His son manages his medication regimen.  Patient does live alone however son lives close.  He is a former Visual merchandiser.  Is blood pressure remains high, with recent readings around 170-200 mmHg.  He does not take his blood pressure at home however son has bought him a blood pressure cuff with plans to begin taking his blood pressure soon.  Review of systems:  Please see the history of present illness. All other systems are reviewed and otherwise negative.     Home Medications:    Current Meds  Medication Sig   amitriptyline  (ELAVIL ) 50  MG tablet Take 1 tablet (50 mg total) by mouth at bedtime.   amLODipine  (NORVASC ) 5 MG tablet Take 1 tablet (5 mg total) by mouth daily.   cyclobenzaprine  (FLEXERIL ) 10 MG tablet Take 1 tablet (10 mg total) by mouth 3 (three) times daily as needed.   hydrALAZINE  (APRESOLINE ) 50 MG tablet Take 1 tablet (50 mg total) by mouth every 8 (eight) hours.   ipratropium-albuterol  (DUONEB) 0.5-2.5 (3) MG/3ML SOLN Take 3 mLs by  nebulization every 4 (four) hours as needed.   isosorbide  mononitrate (IMDUR ) 60 MG 24 hr tablet Take 1 tablet (60 mg total) by mouth daily.   Oxycodone  HCl 10 MG TABS Take 1 tablet (10 mg total) by mouth every 8 (eight) hours as needed.   pantoprazole  (PROTONIX ) 40 MG tablet Take 1 tablet (40 mg total) by mouth daily.   polyethylene glycol (MIRALAX  / GLYCOLAX ) 17 g packet Take 17 g by mouth daily as needed for moderate constipation.   Respiratory Therapy Supplies (NEBULIZER/TUBING/MOUTHPIECE) KIT 1 each by Does not apply route every 4 (four) hours as needed.   Respiratory Therapy Supplies (NEBULIZER/TUBING/MOUTHPIECE) KIT 1 each by Does not apply route every 4 (four) hours as needed.   rosuvastatin  (CRESTOR ) 40 MG tablet TAKE 1 TABLET BY MOUTH DAILY. REPLACES ATORVASTATIN  (STOP ATORVASTATIN  IF STILL TAKING)   sacubitril-valsartan (ENTRESTO) 49-51 MG Take 1 tablet by mouth 2 (two) times daily.   spironolactone (ALDACTONE) 25 MG tablet Take 0.5 tablets (12.5 mg total) by mouth daily.   SUMAtriptan  (IMITREX ) 50 MG tablet TAKE 1 TABLET (50 MG TOTAL) BY MOUTH DAILY. MAY REPEAT IN 2 HOURS IF HEADACHE PERSISTS OR RECURS.   [DISCONTINUED] losartan  (COZAAR ) 25 MG tablet Take 1 tablet (25 mg total) by mouth daily.   Studies Reviewed:       Echocardiogram 04/21/2023 1. Left ventricular ejection fraction, by estimation, is 40 to 45%. The  left ventricle has mildly decreased function. The left ventricle  demonstrates global hypokinesis. There is mild left ventricular  hypertrophy. Left ventricular diastolic parameters  are consistent with Grade I diastolic dysfunction (impaired relaxation).   2. Right ventricular systolic function is normal. The right ventricular  size is normal. There is mildly elevated pulmonary artery systolic  pressure. The estimated right ventricular systolic pressure is 38.0 mmHg.   3. Moderate pleural effusion in both left and right lateral regions.   4. The mitral valve is  abnormal. Trivial mitral valve regurgitation.   5. The aortic valve is tricuspid. Aortic valve regurgitation is trivial.  Aortic valve sclerosis/calcification is present, without any evidence of  aortic stenosis.   6. The inferior vena cava is normal in size with greater than 50%  respiratory variability, suggesting right atrial pressure of 3 mmHg.   Risk Assessment/Calculations:    CHA2DS2-VASc Score = 6   This indicates a 9.7% annual risk of stroke. The patient's score is based upon: CHF History: 1 HTN History: 1 Diabetes History: 0 Stroke History: 2 Vascular Disease History: 0 Age Score: 2 Gender Score: 0    HYPERTENSION CONTROL Vitals:   06/07/23 0820 06/07/23 1003  BP: (!) 170/70 (!) 164/74    The patient's blood pressure is elevated above target today.  In order to address the patient's elevated BP: A new medication was prescribed today.          Physical Exam:   VS:  BP (!) 164/74 (BP Location: Left Arm, Patient Position: Sitting, Cuff Size: Normal)   Pulse (!) 105   Ht 5'  11" (1.803 m)   Wt 149 lb (67.6 kg)   SpO2 95%   BMI 20.78 kg/m    Wt Readings from Last 3 Encounters:  06/07/23 149 lb (67.6 kg)  05/19/23 153 lb 9.6 oz (69.7 kg)  05/19/23 145 lb (65.8 kg)    GEN: Well nourished, well developed in no acute distress NECK: No JVD; No carotid bruits CARDIAC: RRR, no murmurs, rubs, gallops RESPIRATORY:  Clear to auscultation without rales, wheezing or rhonchi  ABDOMEN: Soft, non-tender, non-distended EXTREMITIES:  No edema; No acute deformity     Assessment and Plan:  HFmrEF Echocardiogram 11/2022 with LVEF 60 to 65% Echocardiogram 04/2023 with EF 40-45% There is unclear etiology for reduction in his LVEF.  Could be related to septic cardiomyopathy versus uncontrolled HTN.  As noted cardiac cath recently deferred due to AKI on CKD. cMRI is currently pending to rule out infiltrative disease Per AHF clinic patient is not a candidate for advanced therapies  with significant COPD His AKI has recently resolved and GFR now >60 on 5/25.   He is currently pending results of cMRI.  Could consider LHC in future with discussion by primary cardiologist - Today patient is euvolemic and well compensated on exam.  NYHA class II - Plan will be to discontinue losartan  25 mg daily and initiate Entresto 49/51 mg twice twice daily and spironolactone 12.5 mg daily in order to further optimize his GDMT as his HTN has been severely uncontrolled - Will continue hydralazine  50 mg every 8 hours and Imdur  60 mg daily - Would avoid SGLT2i with concern for hygiene and volume depletion - Consider bisoprolol next - BMET today and again in 2 weeks  CKD stage III H/o AKI on CKD during recent admission, now improved Creatinine 1.21, GFR >60 on 5/25 - Repeat BMET today and again in 2 weeks  Hypertension Blood pressure today is 170/70 and repeat 164/74 Blood pressure has been uncontrolled.  Previously documented as 200/76 with a target clear clinic on 4/18 - Plan will be to discontinue his losartan  and start Entresto 49/51 mg twice daily and spironolactone 12.5 mg daily - Continue amlodipine  5 mg daily, hydralazine  50 mg every 8 hours, Imdur  60 mg daily  Paroxysmal atrial fibrillation Episode noted during admission with sepsis/MSSA bacteremia on 11/2022 and spontaneously converted to sinus rhythm.  Was noted not to be a candidate for anticoagulation due to recurrent falls at the time as he was also noted to have history of GI bleed due to duodenal ulcer in 2021 - Patient denies any symptoms concerning for recurrent atrial fibrillation - Can consider ZIO on follow-up visit to assess for A-fib burden  Iron deficiency anemia Hemoglobin 9.0 on 05/01/2023 - Has been referred to hematology by primary care provider      Dispo:  Return in about 1 month (around 07/08/2023).  Signed, Ava Boatman, NP

## 2023-06-07 NOTE — Patient Instructions (Signed)
 Medication Instructions:  STOP TAKING LOSARTAN . START TAKING ENTRESTO 49/51 TWICE DAILY. START TAKING SPIRONOLACTONE 12.5 MG DAILY.   Lab Work: BMET TO BE DONE TODAY THEN AGAIN IN 2 WEEKS.   Testing/Procedures: NONE  Follow-Up: At [facility], you and your health needs are our priority.  As part of our continuing mission to provide you with exceptional heart care, our providers are all part of one team.  This team includes your primary Cardiologist (physician) and Advanced Practice Providers or APPs (Physician Assistants and Nurse Practitioners) who all work together to provide you with the care you need, when you need it.  Your next appointment:   1 MONTH  Provider:   MADISON FOUNTAIN, DNP

## 2023-06-08 LAB — BASIC METABOLIC PANEL WITH GFR
BUN/Creatinine Ratio: 14 (ref 10–24)
BUN: 23 mg/dL (ref 8–27)
CO2: 21 mmol/L (ref 20–29)
Calcium: 8.6 mg/dL (ref 8.6–10.2)
Chloride: 104 mmol/L (ref 96–106)
Creatinine, Ser: 1.6 mg/dL — ABNORMAL HIGH (ref 0.76–1.27)
Glucose: 104 mg/dL — ABNORMAL HIGH (ref 70–99)
Potassium: 3.9 mmol/L (ref 3.5–5.2)
Sodium: 140 mmol/L (ref 134–144)
eGFR: 44 mL/min/{1.73_m2} — ABNORMAL LOW (ref >59)

## 2023-06-09 ENCOUNTER — Encounter (HOSPITAL_COMMUNITY): Payer: Self-pay

## 2023-06-13 NOTE — Telephone Encounter (Signed)
 Called and spoke with patients son- patient was started on Entresto /Spiro at last Cardiology visit.    Advised this was recommendation as well from HF clinic. Advised to call back with any issues, and patients sone verbalized understanding.

## 2023-06-14 ENCOUNTER — Ambulatory Visit: Payer: Self-pay | Admitting: Emergency Medicine

## 2023-06-16 ENCOUNTER — Telehealth: Payer: Medicare HMO | Admitting: Internal Medicine

## 2023-06-16 DIAGNOSIS — G8929 Other chronic pain: Secondary | ICD-10-CM | POA: Diagnosis not present

## 2023-06-16 DIAGNOSIS — M5441 Lumbago with sciatica, right side: Secondary | ICD-10-CM

## 2023-06-16 DIAGNOSIS — M542 Cervicalgia: Secondary | ICD-10-CM | POA: Diagnosis not present

## 2023-06-16 DIAGNOSIS — Z79899 Other long term (current) drug therapy: Secondary | ICD-10-CM | POA: Diagnosis not present

## 2023-06-16 DIAGNOSIS — Z9889 Other specified postprocedural states: Secondary | ICD-10-CM | POA: Diagnosis not present

## 2023-06-16 DIAGNOSIS — N1831 Chronic kidney disease, stage 3a: Secondary | ICD-10-CM

## 2023-06-16 DIAGNOSIS — M5442 Lumbago with sciatica, left side: Secondary | ICD-10-CM | POA: Diagnosis not present

## 2023-06-16 MED ORDER — EMPAGLIFLOZIN 10 MG PO TABS
10.0000 mg | ORAL_TABLET | Freq: Every day | ORAL | 3 refills | Status: DC
Start: 2023-06-16 — End: 2023-09-14

## 2023-06-16 MED ORDER — OXYCODONE HCL 10 MG PO TABS: 10.0000 mg | ORAL_TABLET | Freq: Three times a day (TID) | ORAL | 0 refills | Status: DC | PRN

## 2023-06-16 NOTE — Progress Notes (Signed)
 ====================================  [facility] [facility] [facility]: [phone]   --  Virtual Video Medical Office Visit --  Patient: Michael Bender      Age: 78 y.o.       Sex:  male  Date:   06/16/2023 Today's Healthcare Provider: Anthon Kins, MD  ====================================    Chief Complaint/Reason For Visit: Pain Management (Would like to review over what pain dr did. Also there is some changes in meds from heart dr since last visit with pcp.)  History of Present Illness 78 year old male with chronic pain and kidney disease who presents for follow-up on pain management and kidney function.  He experiences chronic pain primarily in his back and neck, with a history of previous surgeries and hardware placement in both areas. Despite multiple surgeries, including two ganglionectomies, he continues to experience significant pain. Spinal stimulators have been attempted several times but were unsuccessful due to extensive scar tissue. His current pain management regimen includes oxaydo , 10 mg three times a day, which reduces his pain by approximately 60%. Previously, he was on a higher dose of 30 mg three times a day, which was tapered down.  He recently visited a new pain management clinic but found it unsuitable as the doctor was unwilling to maintain his current medication regimen and suggested switching to a less potent opioid. He has not returned to this clinic and is considering other options for pain management.  He previously was following with Dr. Rudolfo Cosier who was reportedly ok with managing the regimen but there was a bad interaction with staff there.  He is willing to return.  Regarding his kidney function, recent lab results indicated an increase in creatinine levels. His GFR has fluctuated, with a recent reading of 44%, down from 55% earlier in the year, consistent with his long-standing kidney disease history. He is scheduled for further lab work  next week to monitor his kidney function.  He does not drive and relies on his sons for transportation to appointments and other activities. He occasionally drives short distances on his property but not for public outings.   Medications reviewed Current Outpatient Medications on File Prior to Visit  Medication Sig   amitriptyline  (ELAVIL ) 50 MG tablet Take 1 tablet (50 mg total) by mouth at bedtime.   amLODipine  (NORVASC ) 5 MG tablet Take 1 tablet (5 mg total) by mouth daily.   cyclobenzaprine  (FLEXERIL ) 10 MG tablet Take 1 tablet (10 mg total) by mouth 3 (three) times daily as needed.   Ferric Maltol  (ACCRUFER ) 30 MG CAPS Take 1 capsule (30 mg total) by mouth 2 (two) times daily. Recheck iron to decide how long to continue   hydrALAZINE  (APRESOLINE ) 50 MG tablet Take 1 tablet (50 mg total) by mouth every 8 (eight) hours.   ipratropium-albuterol  (DUONEB) 0.5-2.5 (3) MG/3ML SOLN Take 3 mLs by nebulization every 4 (four) hours as needed.   isosorbide  mononitrate (IMDUR ) 60 MG 24 hr tablet Take 1 tablet (60 mg total) by mouth daily.   pantoprazole  (PROTONIX ) 40 MG tablet Take 1 tablet (40 mg total) by mouth daily.   polyethylene glycol (MIRALAX  / GLYCOLAX ) 17 g packet Take 17 g by mouth daily as needed for moderate constipation.   Respiratory Therapy Supplies (NEBULIZER/TUBING/MOUTHPIECE) KIT 1 each by Does not apply route every 4 (four) hours as needed.   Respiratory Therapy Supplies (NEBULIZER/TUBING/MOUTHPIECE) KIT 1 each by Does not apply route every 4 (four) hours as needed.   rosuvastatin  (CRESTOR ) 40  MG tablet TAKE 1 TABLET BY MOUTH DAILY. REPLACES ATORVASTATIN  (STOP ATORVASTATIN  IF STILL TAKING)   sacubitril-valsartan (ENTRESTO ) 49-51 MG Take 1 tablet by mouth 2 (two) times daily.   SUMAtriptan  (IMITREX ) 50 MG tablet TAKE 1 TABLET (50 MG TOTAL) BY MOUTH DAILY. MAY REPEAT IN 2 HOURS IF HEADACHE PERSISTS OR RECURS.   No current facility-administered medications on file prior to visit.    Medications Discontinued During This Encounter  Medication Reason   Oxycodone  HCl 10 MG TABS Reorder        Virtual Physical Exam Physical Exam   General Appearance:  Well Developed, Well Nourished, No Acute Distress by Limited Video Assessment Pulmonary:  No Respiratory Distress Apparent. Normal Work of Breathing.   Neurological:  Awake, Alert. No Obvious Focal Neurological Deficits or Cognitive Impairments.  Sensorium Seems Unclouded. Psychiatric:  Appropriate Mood, Pleasant Demeanor, Calm, Articulate, Good Mood        No results found for any visits on 06/16/23. Office Visit on 06/07/2023  Component Date Value   Glucose 06/07/2023 104 (H)    BUN 06/07/2023 23    Creatinine, Ser 06/07/2023 1.60 (H)    eGFR 06/07/2023 44 (L)    BUN/Creatinine Ratio 06/07/2023 14    Sodium 06/07/2023 140    Potassium 06/07/2023 3.9    Chloride 06/07/2023 104    CO2 06/07/2023 21    Calcium  06/07/2023 8.6   Hospital Outpatient Visit on 05/19/2023  Component Date Value   Sodium 05/19/2023 136    Potassium 05/19/2023 3.4 (L)    Chloride 05/19/2023 103    CO2 05/19/2023 24    Glucose, Bld 05/19/2023 125 (H)    BUN 05/19/2023 12    Creatinine, Ser 05/19/2023 1.21    Calcium  05/19/2023 8.4 (L)    GFR, Estimated 05/19/2023 >60    Anion gap 05/19/2023 9    B Natriuretic Peptide 05/19/2023 669.2 (H)   Hospital Outpatient Visit on 05/01/2023  Component Date Value   Sodium 05/01/2023 139    Potassium 05/01/2023 3.6    Chloride 05/01/2023 108    CO2 05/01/2023 25    Glucose, Bld 05/01/2023 123 (H)    BUN 05/01/2023 14    Creatinine, Ser 05/01/2023 1.30 (H)    Calcium  05/01/2023 8.5 (L)    GFR, Estimated 05/01/2023 56 (L)    Anion gap 05/01/2023 6    B Natriuretic Peptide 05/01/2023 491.3 (H)    WBC 05/01/2023 6.0    RBC 05/01/2023 3.04 (L)    Hemoglobin 05/01/2023 9.0 (L)    HCT 05/01/2023 28.2 (L)    MCV 05/01/2023 92.8    MCH 05/01/2023 29.6    MCHC 05/01/2023 31.9    RDW  05/01/2023 13.7    Platelets 05/01/2023 216    nRBC 05/01/2023 0.0    Iron 05/01/2023 42 (L)    TIBC 05/01/2023 309    Saturation Ratios 05/01/2023 14 (L)    UIBC 05/01/2023 267    Ferritin 05/01/2023 92   Admission on 04/20/2023, Discharged on 04/24/2023  Component Date Value   WBC 04/20/2023 6.6    RBC 04/20/2023 2.89 (L)    Hemoglobin 04/20/2023 8.7 (L)    HCT 04/20/2023 26.3 (L)    MCV 04/20/2023 91.0    MCH 04/20/2023 30.1    MCHC 04/20/2023 33.1    RDW 04/20/2023 14.2    Platelets 04/20/2023 224    nRBC 04/20/2023 0.0    Neutrophils Relative % 04/20/2023 77    Neutro Abs 04/20/2023 5.1  Lymphocytes Relative 04/20/2023 12    Lymphs Abs 04/20/2023 0.8    Monocytes Relative 04/20/2023 8    Monocytes Absolute 04/20/2023 0.5    Eosinophils Relative 04/20/2023 2    Eosinophils Absolute 04/20/2023 0.2    Basophils Relative 04/20/2023 1    Basophils Absolute 04/20/2023 0.0    Immature Granulocytes 04/20/2023 0    Abs Immature Granulocytes 04/20/2023 0.02    Sodium 04/20/2023 137    Potassium 04/20/2023 2.8 (L)    Chloride 04/20/2023 102    CO2 04/20/2023 27    Glucose, Bld 04/20/2023 135 (H)    BUN 04/20/2023 17    Creatinine, Ser 04/20/2023 1.39 (H)    Calcium  04/20/2023 8.1 (L)    GFR, Estimated 04/20/2023 52 (L)    Anion gap 04/20/2023 8    B Natriuretic Peptide 04/20/2023 2,352.1 (H)    Troponin I (High Sensiti* 04/20/2023 79 (H)    SARS Coronavirus 2 by RT* 04/20/2023 NEGATIVE    Influenza A by PCR 04/20/2023 NEGATIVE    Influenza B by PCR 04/20/2023 NEGATIVE    Resp Syncytial Virus by * 04/20/2023 NEGATIVE    Magnesium  04/20/2023 1.9    Troponin I (High Sensiti* 04/20/2023 72 (H)    Sodium 04/21/2023 140    Potassium 04/21/2023 3.3 (L)    Chloride 04/21/2023 105    CO2 04/21/2023 27    Glucose, Bld 04/21/2023 111 (H)    BUN 04/21/2023 17    Creatinine, Ser 04/21/2023 1.56 (H)    Calcium  04/21/2023 8.2 (L)    Total Protein 04/21/2023 6.0 (L)    Albumin  04/21/2023 2.6 (L)    AST 04/21/2023 19    ALT 04/21/2023 7    Alkaline Phosphatase 04/21/2023 84    Total Bilirubin 04/21/2023 0.5    GFR, Estimated 04/21/2023 45 (L)    Anion gap 04/21/2023 8    WBC 04/21/2023 5.8    RBC 04/21/2023 2.92 (L)    Hemoglobin 04/21/2023 8.7 (L)    HCT 04/21/2023 26.6 (L)    MCV 04/21/2023 91.1    MCH 04/21/2023 29.8    MCHC 04/21/2023 32.7    RDW 04/21/2023 14.3    Platelets 04/21/2023 225    nRBC 04/21/2023 0.0    Magnesium  04/21/2023 1.9    Weight 04/21/2023 2,303.37    Height 04/21/2023 71    BP 04/21/2023 168/64    Single Plane A2C EF 04/21/2023 36.9    Single Plane A4C EF 04/21/2023 37.7    Calc EF 04/21/2023 36.1    S' Lateral 04/21/2023 4.00    AR max vel 04/21/2023 2.15    AV Area VTI 04/21/2023 2.32    AV Mean grad 04/21/2023 4.0    AV Peak grad 04/21/2023 7.2    Ao pk vel 04/21/2023 1.34    Area-P 1/2 04/21/2023 4.96    AV Area mean vel 04/21/2023 2.02    MV VTI 04/21/2023 2.63    Est EF 04/21/2023 40 - 45%    Sodium 04/22/2023 140    Potassium 04/22/2023 3.2 (L)    Chloride 04/22/2023 101    CO2 04/22/2023 28    Glucose, Bld 04/22/2023 115 (H)    BUN 04/22/2023 19    Creatinine, Ser 04/22/2023 1.72 (H)    Calcium  04/22/2023 8.6 (L)    GFR, Estimated 04/22/2023 40 (L)    Anion gap 04/22/2023 11    Magnesium  04/22/2023 1.8    Sodium 04/23/2023 139    Potassium 04/23/2023 4.1  Chloride 04/23/2023 104    CO2 04/23/2023 27    Glucose, Bld 04/23/2023 139 (H)    BUN 04/23/2023 25 (H)    Creatinine, Ser 04/23/2023 1.72 (H)    Calcium  04/23/2023 8.5 (L)    GFR, Estimated 04/23/2023 40 (L)    Anion gap 04/23/2023 8    Magnesium  04/23/2023 1.9    Sodium 04/24/2023 138    Potassium 04/24/2023 3.9    Chloride 04/24/2023 104    CO2 04/24/2023 26    Glucose, Bld 04/24/2023 107 (H)    BUN 04/24/2023 21    Creatinine, Ser 04/24/2023 1.77 (H)    Calcium  04/24/2023 8.3 (L)    GFR, Estimated 04/24/2023 39 (L)    Anion gap  04/24/2023 8    Magnesium  04/24/2023 2.0   Office Visit on 02/22/2023  Component Date Value   Sed Rate 02/22/2023 19    CRP 02/22/2023 11.6 (H)    WBC 02/22/2023 6.1    RBC 02/22/2023 3.05 (L)    Hemoglobin 02/22/2023 9.4 (L)    HCT 02/22/2023 29.0 (L)    MCV 02/22/2023 95.1    MCH 02/22/2023 30.8    MCHC 02/22/2023 32.4    RDW 02/22/2023 12.4    Platelets 02/22/2023 166    MPV 02/22/2023 9.8    Neutro Abs 02/22/2023 3,239    Absolute Lymphocytes 02/22/2023 2,129    Absolute Monocytes 02/22/2023 561    Eosinophils Absolute 02/22/2023 153    Basophils Absolute 02/22/2023 18    Neutrophils Relative % 02/22/2023 53.1    Total Lymphocyte 02/22/2023 34.9    Monocytes Relative 02/22/2023 9.2    Eosinophils Relative 02/22/2023 2.5    Basophils Relative 02/22/2023 0.3    Glucose, Bld 02/22/2023 94    BUN 02/22/2023 20    Creat 02/22/2023 1.77 (H)    eGFR 02/22/2023 39 (L)    BUN/Creatinine Ratio 02/22/2023 11    Sodium 02/22/2023 140    Potassium 02/22/2023 3.1 (L)    Chloride 02/22/2023 104    CO2 02/22/2023 26    Calcium  02/22/2023 8.4 (L)    Total Protein 02/22/2023 6.3    Albumin 02/22/2023 3.4 (L)    Globulin 02/22/2023 2.9    AG Ratio 02/22/2023 1.2    Total Bilirubin 02/22/2023 0.3    Alkaline phosphatase (AP* 02/22/2023 92    AST 02/22/2023 21    ALT 02/22/2023 6 (L)   Admission on 02/17/2023, Discharged on 02/17/2023  Component Date Value   Color, Urine 02/17/2023 YELLOW    APPearance 02/17/2023 CLEAR    Specific Gravity, Urine 02/17/2023 1.011    pH 02/17/2023 5.0    Glucose, UA 02/17/2023 NEGATIVE    Hgb urine dipstick 02/17/2023 MODERATE (A)    Bilirubin Urine 02/17/2023 NEGATIVE    Ketones, ur 02/17/2023 NEGATIVE    Protein, ur 02/17/2023 30 (A)    Nitrite 02/17/2023 NEGATIVE    Leukocytes,Ua 02/17/2023 NEGATIVE    RBC / HPF 02/17/2023 0-5    WBC, UA 02/17/2023 0-5    Bacteria, UA 02/17/2023 NONE SEEN    Squamous Epithelial / HPF 02/17/2023 0-5     Mucus 02/17/2023 PRESENT    Glucose-Capillary 02/17/2023 164 (H)    WBC 02/17/2023 5.0    RBC 02/17/2023 2.85 (L)    Hemoglobin 02/17/2023 8.9 (L)    HCT 02/17/2023 27.3 (L)    MCV 02/17/2023 95.8    MCH 02/17/2023 31.2    MCHC 02/17/2023 32.6    RDW 02/17/2023 13.2  Platelets 02/17/2023 141 (L)    nRBC 02/17/2023 0.0    Neutrophils Relative % 02/17/2023 65    Neutro Abs 02/17/2023 3.2    Lymphocytes Relative 02/17/2023 24    Lymphs Abs 02/17/2023 1.2    Monocytes Relative 02/17/2023 10    Monocytes Absolute 02/17/2023 0.5    Eosinophils Relative 02/17/2023 1    Eosinophils Absolute 02/17/2023 0.1    Basophils Relative 02/17/2023 0    Basophils Absolute 02/17/2023 0.0    Immature Granulocytes 02/17/2023 0    Abs Immature Granulocytes 02/17/2023 0.01    Sodium 02/17/2023 139    Potassium 02/17/2023 3.6    Chloride 02/17/2023 105    CO2 02/17/2023 23    Glucose, Bld 02/17/2023 188 (H)    BUN 02/17/2023 26 (H)    Creatinine, Ser 02/17/2023 2.10 (H)    Calcium  02/17/2023 8.5 (L)    Total Protein 02/17/2023 6.3 (L)    Albumin 02/17/2023 3.1 (L)    AST 02/17/2023 32    ALT 02/17/2023 11    Alkaline Phosphatase 02/17/2023 79    Total Bilirubin 02/17/2023 0.5    GFR, Estimated 02/17/2023 32 (L)    Anion gap 02/17/2023 11    Sed Rate 02/17/2023 18 (H)    CRP 02/17/2023 0.8   Office Visit on 01/17/2023  Component Date Value   CRP 01/17/2023 5.6    Sed Rate 01/17/2023 36 (H)    Glucose, Bld 01/17/2023 167 (H)    BUN 01/17/2023 21    Creat 01/17/2023 1.71 (H)    eGFR 01/17/2023 40 (L)    BUN/Creatinine Ratio 01/17/2023 12    Sodium 01/17/2023 141    Potassium 01/17/2023 4.7    Chloride 01/17/2023 107    CO2 01/17/2023 26    Calcium  01/17/2023 8.7    Total Protein 01/17/2023 6.6    Albumin 01/17/2023 3.7    Globulin 01/17/2023 2.9    AG Ratio 01/17/2023 1.3    Total Bilirubin 01/17/2023 0.4    Alkaline phosphatase (AP* 01/17/2023 106    AST 01/17/2023 19    ALT  01/17/2023 6 (L)    WBC 01/17/2023 5.6    RBC 01/17/2023 2.69 (L)    Hemoglobin 01/17/2023 8.3 (L)    HCT 01/17/2023 26.1 (L)    MCV 01/17/2023 97.0    MCH 01/17/2023 30.9    MCHC 01/17/2023 31.8 (L)    RDW 01/17/2023 13.1    Platelets 01/17/2023 181    MPV 01/17/2023 10.4    Neutro Abs 01/17/2023 2,638    Absolute Lymphocytes 01/17/2023 2,050    Absolute Monocytes 01/17/2023 521    Eosinophils Absolute 01/17/2023 353    Basophils Absolute 01/17/2023 39    Neutrophils Relative % 01/17/2023 47.1    Total Lymphocyte 01/17/2023 36.6    Monocytes Relative 01/17/2023 9.3    Eosinophils Relative 01/17/2023 6.3    Basophils Relative 01/17/2023 0.7   Lab on 01/02/2023  Component Date Value   WBC 01/02/2023 6.1    RBC 01/02/2023 2.56 (L)    Hemoglobin 01/02/2023 7.9 (L)    HCT 01/02/2023 25.2 (L)    MCV 01/02/2023 98.4    MCH 01/02/2023 30.9    MCHC 01/02/2023 31.3    RDW 01/02/2023 14.1    Platelets 01/02/2023 214    nRBC 01/02/2023 0.0    Neutrophils Relative % 01/02/2023 58    Neutro Abs 01/02/2023 3.5    Lymphocytes Relative 01/02/2023 29    Lymphs Abs 01/02/2023 1.7  Monocytes Relative 01/02/2023 10    Monocytes Absolute 01/02/2023 0.6    Eosinophils Relative 01/02/2023 3    Eosinophils Absolute 01/02/2023 0.2    Basophils Relative 01/02/2023 0    Basophils Absolute 01/02/2023 0.0    Immature Granulocytes 01/02/2023 0    Abs Immature Granulocytes 01/02/2023 0.02   Office Visit on 12/28/2022  Component Date Value   CRP 12/28/2022 32.2 (H)    Sed Rate 12/28/2022 67 (H)    Glucose, Bld 12/28/2022 135 (H)    BUN 12/28/2022 27 (H)    Creat 12/28/2022 1.79 (H)    eGFR 12/28/2022 38 (L)    BUN/Creatinine Ratio 12/28/2022 15    Sodium 12/28/2022 139    Potassium 12/28/2022 5.1    Chloride 12/28/2022 113 (H)    CO2 12/28/2022 18 (L)    Calcium  12/28/2022 8.5 (L)    Total Protein 12/28/2022 6.7    Albumin 12/28/2022 3.5 (L)    Globulin 12/28/2022 3.2    AG Ratio  12/28/2022 1.1    Total Bilirubin 12/28/2022 0.2    Alkaline phosphatase (AP* 12/28/2022 118    AST 12/28/2022 12    ALT 12/28/2022 <3 (L)    WBC 12/28/2022 5.6    RBC 12/28/2022 2.30 (L)    Hemoglobin 12/28/2022 7.1 (L)    HCT 12/28/2022 22.2 (L)    MCV 12/28/2022 96.5    MCH 12/28/2022 30.9    MCHC 12/28/2022 32.0    RDW 12/28/2022 13.1    Platelets 12/28/2022 205    MPV 12/28/2022 9.4    Neutro Abs 12/28/2022 3,875    Absolute Lymphocytes 12/28/2022 1,042    Absolute Monocytes 12/28/2022 465    Eosinophils Absolute 12/28/2022 196    Basophils Absolute 12/28/2022 22    Neutrophils Relative % 12/28/2022 69.2    Total Lymphocyte 12/28/2022 18.6    Monocytes Relative 12/28/2022 8.3    Eosinophils Relative 12/28/2022 3.5    Basophils Relative 12/28/2022 0.4   No results displayed because visit has over 200 results.    There may be more visits with results that are not included.  No image results found. MR CARDIAC MORPHOLOGY W WO CONTRAST Result Date: 06/09/2023 CLINICAL DATA:  Clinical question of Heart Failure Study assumes HCT of 28.2  And BSA of 1.89 m2. EXAM: CARDIAC MRI TECHNIQUE: The patient was scanned on a 1.5 Tesla GE magnet. A dedicated cardiac coil was used. Functional imaging was done using Fiesta sequences. 2,3, and 4 chamber views were done to assess for RWMA's. Modified Simpson's rule using was used to calculate an ejection fraction on a dedicated work [employer], [employer]. The patient received 10 cc of Gadavist . After 10 minutes inversion recovery sequences were used to assess for infiltration and scar tissue. Flow quantification was performed 2 times during this examination with flow quantification performed at the levels of the ascending aorta above the valve, pulmonary artery above the valve. CONTRAST:  10 cc  of Gadavist  FINDINGS: 1. Moderately dilated left ventricular size, with LVEDD 56 mm, but LVEDVi 120 mL/m2. Mild increase in left ventricular thickness,  with intraventricular septal thickness of 11 mm, posterior wall thickness of 8 mm, but myocardial mass index of 96 g/m2. Mild decrease left ventricular systolic function (LVEF =42%). There are no regional wall motion abnormalities but global hypokinesis. Left ventricular parametric mapping notable for ECV elevation in the RV insertion point (44%) and normal T2. ECV elevation can be seen in increased right ventricular filling pressures. There is no  late gadolinium enhancement in the left ventricular myocardium. 2. Normal right ventricular size with RVEDVI 77 mL/m2. Normal right ventricular thickness. Normal right ventricular systolic function (RVEF =48%). There are no regional wall motion abnormalities or aneurysms. 3. Normal left and right atrial size. Atrial septal aneurysm. Cannot exclude small PFO. IVC is dilated, 25 mm. 4. Evidence of mild ascending aortic dilation, 43 mm. Moderate main pulmonary artery dilation, 32 mm Normal aortic root. 5. Valve assessment: Aortic Valve: Qualitatively there are multiple jets of aortic regurgitation. Moderate to severe regurgitation with regurgitant fraction 35%. Gradient 1.8 mm Hg. Pulmonic Valve: Qualitatively, there is no significant regurgitation. Regurgitant fraction 4%. Tricuspid Valve: Qualitatively, there is mild, central regurgitation. Regurgitant fraction 8%. Mitral Valve: Qualitatively, there is mild, central regurgitation. Regurgitant fraction 7%. 6.  Normal pericardium.  Trivial, inferior, pericardial effusion. 7. Grossly, no extracardiac findings. Recommended dedicated study if concerned for non-cardiac pathology. 8.  Breath hold artifacts noted. IMPRESSION: 1. Mild decrease left ventricular systolic function (LVEF =42%). 2. Moderate to severe regurgitation with regurgitant fraction 35% with moderate left ventricular dilation. This is markedly different that color Doppler evaluation on recent echocardiogram. Consider TEE assessment as this MRI is consistent with  aortic regurgitation associated left ventricular dysfunction. 3. Evidence of mild ascending aortic dilation, 43 mm. Gloriann Larger MD Electronically Signed   By: Gloriann Larger M.D.   On: 06/09/2023 12:55   MR CARDIAC VELOCITY FLOW MAP Result Date: 06/09/2023 CLINICAL DATA:  Clinical question of Heart Failure Study assumes HCT of 28.2  And BSA of 1.89 m2. EXAM: CARDIAC MRI TECHNIQUE: The patient was scanned on a 1.5 Tesla GE magnet. A dedicated cardiac coil was used. Functional imaging was done using Fiesta sequences. 2,3, and 4 chamber views were done to assess for RWMA's. Modified Simpson's rule using was used to calculate an ejection fraction on a dedicated work [employer] officer, [employer]. The patient received 10 cc of Gadavist . After 10 minutes inversion recovery sequences were used to assess for infiltration and scar tissue. Flow quantification was performed 2 times during this examination with flow quantification performed at the levels of the ascending aorta above the valve, pulmonary artery above the valve. CONTRAST:  10 cc  of Gadavist  FINDINGS: 1. Moderately dilated left ventricular size, with LVEDD 56 mm, but LVEDVi 120 mL/m2. Mild increase in left ventricular thickness, with intraventricular septal thickness of 11 mm, posterior wall thickness of 8 mm, but myocardial mass index of 96 g/m2. Mild decrease left ventricular systolic function (LVEF =42%). There are no regional wall motion abnormalities but global hypokinesis. Left ventricular parametric mapping notable for ECV elevation in the RV insertion point (44%) and normal T2. ECV elevation can be seen in increased right ventricular filling pressures. There is no late gadolinium enhancement in the left ventricular myocardium. 2. Normal right ventricular size with RVEDVI 77 mL/m2. Normal right ventricular thickness. Normal right ventricular systolic function (RVEF =48%). There are no regional wall motion abnormalities or aneurysms. 3.  Normal left and right atrial size. Atrial septal aneurysm. Cannot exclude small PFO. IVC is dilated, 25 mm. 4. Evidence of mild ascending aortic dilation, 43 mm. Moderate main pulmonary artery dilation, 32 mm Normal aortic root. 5. Valve assessment: Aortic Valve: Qualitatively there are multiple jets of aortic regurgitation. Moderate to severe regurgitation with regurgitant fraction 35%. Gradient 1.8 mm Hg. Pulmonic Valve: Qualitatively, there is no significant regurgitation. Regurgitant fraction 4%. Tricuspid Valve: Qualitatively, there is mild, central regurgitation. Regurgitant fraction 8%. Mitral Valve: Qualitatively, there is  mild, central regurgitation. Regurgitant fraction 7%. 6.  Normal pericardium.  Trivial, inferior, pericardial effusion. 7. Grossly, no extracardiac findings. Recommended dedicated study if concerned for non-cardiac pathology. 8.  Breath hold artifacts noted. IMPRESSION: 1. Mild decrease left ventricular systolic function (LVEF =42%). 2. Moderate to severe regurgitation with regurgitant fraction 35% with moderate left ventricular dilation. This is markedly different that color Doppler evaluation on recent echocardiogram. Consider TEE assessment as this MRI is consistent with aortic regurgitation associated left ventricular dysfunction. 3. Evidence of mild ascending aortic dilation, 43 mm. Gloriann Larger MD Electronically Signed   By: Gloriann Larger M.D.   On: 06/09/2023 12:55   MR CARDIAC VELOCITY FLOW MAP Result Date: 06/09/2023 CLINICAL DATA:  Clinical question of Heart Failure Study assumes HCT of 28.2  And BSA of 1.89 m2. EXAM: CARDIAC MRI TECHNIQUE: The patient was scanned on a 1.5 Tesla GE magnet. A dedicated cardiac coil was used. Functional imaging was done using Fiesta sequences. 2,3, and 4 chamber views were done to assess for RWMA's. Modified Simpson's rule using was used to calculate an ejection fraction on a dedicated work [employer] officer, [employer]. The  patient received 10 cc of Gadavist . After 10 minutes inversion recovery sequences were used to assess for infiltration and scar tissue. Flow quantification was performed 2 times during this examination with flow quantification performed at the levels of the ascending aorta above the valve, pulmonary artery above the valve. CONTRAST:  10 cc  of Gadavist  FINDINGS: 1. Moderately dilated left ventricular size, with LVEDD 56 mm, but LVEDVi 120 mL/m2. Mild increase in left ventricular thickness, with intraventricular septal thickness of 11 mm, posterior wall thickness of 8 mm, but myocardial mass index of 96 g/m2. Mild decrease left ventricular systolic function (LVEF =42%). There are no regional wall motion abnormalities but global hypokinesis. Left ventricular parametric mapping notable for ECV elevation in the RV insertion point (44%) and normal T2. ECV elevation can be seen in increased right ventricular filling pressures. There is no late gadolinium enhancement in the left ventricular myocardium. 2. Normal right ventricular size with RVEDVI 77 mL/m2. Normal right ventricular thickness. Normal right ventricular systolic function (RVEF =48%). There are no regional wall motion abnormalities or aneurysms. 3. Normal left and right atrial size. Atrial septal aneurysm. Cannot exclude small PFO. IVC is dilated, 25 mm. 4. Evidence of mild ascending aortic dilation, 43 mm. Moderate main pulmonary artery dilation, 32 mm Normal aortic root. 5. Valve assessment: Aortic Valve: Qualitatively there are multiple jets of aortic regurgitation. Moderate to severe regurgitation with regurgitant fraction 35%. Gradient 1.8 mm Hg. Pulmonic Valve: Qualitatively, there is no significant regurgitation. Regurgitant fraction 4%. Tricuspid Valve: Qualitatively, there is mild, central regurgitation. Regurgitant fraction 8%. Mitral Valve: Qualitatively, there is mild, central regurgitation. Regurgitant fraction 7%. 6.  Normal pericardium.  Trivial,  inferior, pericardial effusion. 7. Grossly, no extracardiac findings. Recommended dedicated study if concerned for non-cardiac pathology. 8.  Breath hold artifacts noted. IMPRESSION: 1. Mild decrease left ventricular systolic function (LVEF =42%). 2. Moderate to severe regurgitation with regurgitant fraction 35% with moderate left ventricular dilation. This is markedly different that color Doppler evaluation on recent echocardiogram. Consider TEE assessment as this MRI is consistent with aortic regurgitation associated left ventricular dysfunction. 3. Evidence of mild ascending aortic dilation, 43 mm. Gloriann Larger MD Electronically Signed   By: Gloriann Larger M.D.   On: 06/09/2023 12:55   DG Chest 2 View Result Date: 04/24/2023 CLINICAL DATA:  Shortness of breath.  CHF. EXAM: CHEST - 2 VIEW COMPARISON:  Chest radiograph dated 04/20/2023. FINDINGS: Background of emphysema. There is blunting of the costophrenic angles which may be chronic and scarring or represent trace pleural effusions. Significant improvement in aeration of the lungs. No consolidative changes or pneumothorax. The cardiac silhouette is within limits. Atherosclerotic calcification of the aorta. No acute osseous pathology. IMPRESSION: 1. Significant improvement in aeration of the lungs. 2. Small bilateral pleural effusions. 3. Emphysema. Electronically Signed   By: Angus Bark M.D.   On: 04/24/2023 14:22   ECHOCARDIOGRAM COMPLETE Result Date: 04/21/2023    ECHOCARDIOGRAM REPORT   Patient Name:   KEANE MARTELLI Date of Exam: 04/21/2023 Medical Rec #:  [id number]        Height:       71.0 in Accession #:    [id number]       Weight:       144.0 lb Date of Birth:  1945/06/17       BSA:          1.834 m Patient Age:    71 years         BP:           168/64 mmHg Patient Gender: M                HR:           94 bpm. Exam Location:  Inpatient Procedure: 2D Echo, Cardiac Doppler, Color Doppler and Intracardiac             Opacification Agent (Both Spectral and Color Flow Doppler were            utilized during procedure). Indications:    Dyspnea  History:        Patient has prior history of Echocardiogram examinations, most                 recent 11/07/2022. COPD; Risk Factors:Hypertension, Dyslipidemia                 and Current Smoker.  Sonographer:    Reta Cassis Referring Phys: [id number] PAULA V ROSS  Sonographer Comments: Technically difficult study due to poor echo windows. Image acquisition challenging due to patient body habitus, Image acquisition challenging due to COPD and Image acquisition challenging due to respiratory motion. IMPRESSIONS  1. Left ventricular ejection fraction, by estimation, is 40 to 45%. The left ventricle has mildly decreased function. The left ventricle demonstrates global hypokinesis. There is mild left ventricular hypertrophy. Left ventricular diastolic parameters are consistent with Grade I diastolic dysfunction (impaired relaxation).  2. Right ventricular systolic function is normal. The right ventricular size is normal. There is mildly elevated pulmonary artery systolic pressure. The estimated right ventricular systolic pressure is 38.0 mmHg.  3. Moderate pleural effusion in both left and right lateral regions.  4. The mitral valve is abnormal. Trivial mitral valve regurgitation.  5. The aortic valve is tricuspid. Aortic valve regurgitation is trivial. Aortic valve sclerosis/calcification is present, without any evidence of aortic stenosis.  6. The inferior vena cava is normal in size with greater than 50% respiratory variability, suggesting right atrial pressure of 3 mmHg. Comparison(s): Changes from prior study are noted. 11/07/2022: LVEF 60-65%. FINDINGS  Left Ventricle: Left ventricular ejection fraction, by estimation, is 40 to 45%. The left ventricle has mildly decreased function. The left ventricle demonstrates global hypokinesis. Definity  contrast agent was given IV to delineate the left  ventricular  endocardial borders. The left ventricular internal cavity size  was normal in size. There is mild left ventricular hypertrophy. Left ventricular diastolic parameters are consistent with Grade I diastolic dysfunction (impaired relaxation). Indeterminate filling pressures. Right Ventricle: The right ventricular size is normal. No increase in right ventricular wall thickness. Right ventricular systolic function is normal. There is mildly elevated pulmonary artery systolic pressure. The tricuspid regurgitant velocity is 2.96  m/s, and with an assumed right atrial pressure of 3 mmHg, the estimated right ventricular systolic pressure is 38.0 mmHg. Left Atrium: Left atrial size was normal in size. Right Atrium: Right atrial size was normal in size. Pericardium: There is no evidence of pericardial effusion. Mitral Valve: The mitral valve is abnormal. Mild mitral annular calcification. Trivial mitral valve regurgitation. MV peak gradient, 5.8 mmHg. The mean mitral valve gradient is 3.0 mmHg. Tricuspid Valve: The tricuspid valve is grossly normal. Tricuspid valve regurgitation is mild. Aortic Valve: The aortic valve is tricuspid. Aortic valve regurgitation is trivial. Aortic valve sclerosis/calcification is present, without any evidence of aortic stenosis. Aortic valve mean gradient measures 4.0 mmHg. Aortic valve peak gradient measures 7.2 mmHg. Aortic valve area, by VTI measures 2.32 cm. Pulmonic Valve: The pulmonic valve was normal in structure. Pulmonic valve regurgitation is not visualized. Aorta: The aortic root and ascending aorta are structurally normal, with no evidence of dilitation. Venous: The inferior vena cava is normal in size with greater than 50% respiratory variability, suggesting right atrial pressure of 3 mmHg. IAS/Shunts: No atrial level shunt detected by color flow Doppler. Additional Comments: There is a moderate pleural effusion in both left and right lateral regions.  LEFT VENTRICLE PLAX  2D LVIDd:         5.10 cm      Diastology LVIDs:         4.00 cm      LV e' medial:    7.72 cm/s LV PW:         1.20 cm      LV E/e' medial:  9.9 LV IVS:        1.10 cm      LV e' lateral:   5.98 cm/s LVOT diam:     2.00 cm      LV E/e' lateral: 12.8 LV SV:         56 LV SV Index:   31 LVOT Area:     3.14 cm  LV Volumes (MOD) LV vol d, MOD A2C: 140.5 ml LV vol d, MOD A4C: 167.0 ml LV vol s, MOD A2C: 88.6 ml LV vol s, MOD A4C: 104.0 ml LV SV MOD A2C:     51.9 ml LV SV MOD A4C:     167.0 ml LV SV MOD BP:      57.7 ml RIGHT VENTRICLE             IVC RV Basal diam:  3.70 cm     IVC diam: 1.90 cm RV S prime:     14.60 cm/s TAPSE (M-mode): 2.8 cm LEFT ATRIUM             Index        RIGHT ATRIUM           Index LA diam:        4.70 cm 2.56 cm/m   RA Area:     14.30 cm LA Vol (A2C):   62.1 ml 33.87 ml/m  RA Volume:   37.50 ml  20.45 ml/m LA Vol (A4C):   40.8 ml 22.25 ml/m LA Biplane Vol: 50.7  ml 27.65 ml/m  AORTIC VALVE AV Area (Vmax):    2.15 cm AV Area (Vmean):   2.02 cm AV Area (VTI):     2.32 cm AV Vmax:           134.00 cm/s AV Vmean:          92.800 cm/s AV VTI:            0.242 m AV Peak Grad:      7.2 mmHg AV Mean Grad:      4.0 mmHg LVOT Vmax:         91.50 cm/s LVOT Vmean:        59.800 cm/s LVOT VTI:          0.179 m LVOT/AV VTI ratio: 0.74  AORTA Ao Root diam: 3.30 cm MITRAL VALVE                TRICUSPID VALVE MV Area (PHT): 4.96 cm     TR Peak grad:   35.0 mmHg MV Area VTI:   2.63 cm     TR Vmax:        296.00 cm/s MV Peak grad:  5.8 mmHg MV Mean grad:  3.0 mmHg     SHUNTS MV Vmax:       1.20 m/s     Systemic VTI:  0.18 m MV Vmean:      76.6 cm/s    Systemic Diam: 2.00 cm MV Decel Time: 153 msec MV E velocity: 76.60 cm/s MV A velocity: 111.00 cm/s MV E/A ratio:  0.69 Dinah Franco MD Electronically signed by Dinah Franco MD Signature Date/Time: 04/21/2023/5:07:36 PM    Final    DG Chest Port 1 View Result Date: 04/20/2023 CLINICAL DATA:  Shortness of breath. EXAM: PORTABLE CHEST 1 VIEW  COMPARISON:  Chest radiograph dated 11/05/2022. FINDINGS: Cardiomegaly with vascular congestion. Small bilateral flu shin is and bibasilar atelectasis, left greater than right. Pneumonia is not excluded. No pneumothorax. Atherosclerotic calcification of the aorta. No acute osseous pathology. IMPRESSION: 1. Cardiomegaly with vascular congestion. 2. Small bilateral pleural effusions and bibasilar atelectasis. Electronically Signed   By: Angus Bark M.D.   On: 04/20/2023 10:32  MR CARDIAC MORPHOLOGY W WO CONTRAST Result Date: 06/09/2023 CLINICAL DATA:  Clinical question of Heart Failure Study assumes HCT of 28.2  And BSA of 1.89 m2. EXAM: CARDIAC MRI TECHNIQUE: The patient was scanned on a 1.5 Tesla GE magnet. A dedicated cardiac coil was used. Functional imaging was done using Fiesta sequences. 2,3, and 4 chamber views were done to assess for RWMA's. Modified Simpson's rule using was used to calculate an ejection fraction on a dedicated work [employer]. The patient received 10 cc of Gadavist . After 10 minutes inversion recovery sequences were used to assess for infiltration and scar tissue. Flow quantification was performed 2 times during this examination with flow quantification performed at the levels of the ascending aorta above the valve, pulmonary artery above the valve. CONTRAST:  10 cc  of Gadavist  FINDINGS: 1. Moderately dilated left ventricular size, with LVEDD 56 mm, but LVEDVi 120 mL/m2. Mild increase in left ventricular thickness, with intraventricular septal thickness of 11 mm, posterior wall thickness of 8 mm, but myocardial mass index of 96 g/m2. Mild decrease left ventricular systolic function (LVEF =42%). There are no regional wall motion abnormalities but global hypokinesis. Left ventricular parametric mapping notable for ECV elevation in the RV insertion point (44%) and normal T2. ECV elevation can be seen in increased  right ventricular filling pressures. There is no late  gadolinium enhancement in the left ventricular myocardium. 2. Normal right ventricular size with RVEDVI 77 mL/m2. Normal right ventricular thickness. Normal right ventricular systolic function (RVEF =48%). There are no regional wall motion abnormalities or aneurysms. 3. Normal left and right atrial size. Atrial septal aneurysm. Cannot exclude small PFO. IVC is dilated, 25 mm. 4. Evidence of mild ascending aortic dilation, 43 mm. Moderate main pulmonary artery dilation, 32 mm Normal aortic root. 5. Valve assessment: Aortic Valve: Qualitatively there are multiple jets of aortic regurgitation. Moderate to severe regurgitation with regurgitant fraction 35%. Gradient 1.8 mm Hg. Pulmonic Valve: Qualitatively, there is no significant regurgitation. Regurgitant fraction 4%. Tricuspid Valve: Qualitatively, there is mild, central regurgitation. Regurgitant fraction 8%. Mitral Valve: Qualitatively, there is mild, central regurgitation. Regurgitant fraction 7%. 6.  Normal pericardium.  Trivial, inferior, pericardial effusion. 7. Grossly, no extracardiac findings. Recommended dedicated study if concerned for non-cardiac pathology. 8.  Breath hold artifacts noted. IMPRESSION: 1. Mild decrease left ventricular systolic function (LVEF =42%). 2. Moderate to severe regurgitation with regurgitant fraction 35% with moderate left ventricular dilation. This is markedly different that color Doppler evaluation on recent echocardiogram. Consider TEE assessment as this MRI is consistent with aortic regurgitation associated left ventricular dysfunction. 3. Evidence of mild ascending aortic dilation, 43 mm. Gloriann Larger MD Electronically Signed   By: Gloriann Larger M.D.   On: 06/09/2023 12:55   MR CARDIAC VELOCITY FLOW MAP Result Date: 06/09/2023 CLINICAL DATA:  Clinical question of Heart Failure Study assumes HCT of 28.2  And BSA of 1.89 m2. EXAM: CARDIAC MRI TECHNIQUE: The patient was scanned on a 1.5 Tesla GE magnet. A  dedicated cardiac coil was used. Functional imaging was done using Fiesta sequences. 2,3, and 4 chamber views were done to assess for RWMA's. Modified Simpson's rule using was used to calculate an ejection fraction on a dedicated work [employer], [employer]. The patient received 10 cc of Gadavist . After 10 minutes inversion recovery sequences were used to assess for infiltration and scar tissue. Flow quantification was performed 2 times during this examination with flow quantification performed at the levels of the ascending aorta above the valve, pulmonary artery above the valve. CONTRAST:  10 cc  of Gadavist  FINDINGS: 1. Moderately dilated left ventricular size, with LVEDD 56 mm, but LVEDVi 120 mL/m2. Mild increase in left ventricular thickness, with intraventricular septal thickness of 11 mm, posterior wall thickness of 8 mm, but myocardial mass index of 96 g/m2. Mild decrease left ventricular systolic function (LVEF =42%). There are no regional wall motion abnormalities but global hypokinesis. Left ventricular parametric mapping notable for ECV elevation in the RV insertion point (44%) and normal T2. ECV elevation can be seen in increased right ventricular filling pressures. There is no late gadolinium enhancement in the left ventricular myocardium. 2. Normal right ventricular size with RVEDVI 77 mL/m2. Normal right ventricular thickness. Normal right ventricular systolic function (RVEF =48%). There are no regional wall motion abnormalities or aneurysms. 3. Normal left and right atrial size. Atrial septal aneurysm. Cannot exclude small PFO. IVC is dilated, 25 mm. 4. Evidence of mild ascending aortic dilation, 43 mm. Moderate main pulmonary artery dilation, 32 mm Normal aortic root. 5. Valve assessment: Aortic Valve: Qualitatively there are multiple jets of aortic regurgitation. Moderate to severe regurgitation with regurgitant fraction 35%. Gradient 1.8 mm Hg. Pulmonic Valve: Qualitatively, there is no  significant regurgitation. Regurgitant fraction 4%. Tricuspid Valve: Qualitatively, there is mild, central regurgitation.  Regurgitant fraction 8%. Mitral Valve: Qualitatively, there is mild, central regurgitation. Regurgitant fraction 7%. 6.  Normal pericardium.  Trivial, inferior, pericardial effusion. 7. Grossly, no extracardiac findings. Recommended dedicated study if concerned for non-cardiac pathology. 8.  Breath hold artifacts noted. IMPRESSION: 1. Mild decrease left ventricular systolic function (LVEF =42%). 2. Moderate to severe regurgitation with regurgitant fraction 35% with moderate left ventricular dilation. This is markedly different that color Doppler evaluation on recent echocardiogram. Consider TEE assessment as this MRI is consistent with aortic regurgitation associated left ventricular dysfunction. 3. Evidence of mild ascending aortic dilation, 43 mm. Gloriann Larger MD Electronically Signed   By: Gloriann Larger M.D.   On: 06/09/2023 12:55   MR CARDIAC VELOCITY FLOW MAP Result Date: 06/09/2023 CLINICAL DATA:  Clinical question of Heart Failure Study assumes HCT of 28.2  And BSA of 1.89 m2. EXAM: CARDIAC MRI TECHNIQUE: The patient was scanned on a 1.5 Tesla GE magnet. A dedicated cardiac coil was used. Functional imaging was done using Fiesta sequences. 2,3, and 4 chamber views were done to assess for RWMA's. Modified Simpson's rule using was used to calculate an ejection fraction on a dedicated work [employer], [employer]. The patient received 10 cc of Gadavist . After 10 minutes inversion recovery sequences were used to assess for infiltration and scar tissue. Flow quantification was performed 2 times during this examination with flow quantification performed at the levels of the ascending aorta above the valve, pulmonary artery above the valve. CONTRAST:  10 cc  of Gadavist  FINDINGS: 1. Moderately dilated left ventricular size, with LVEDD 56 mm, but LVEDVi 120 mL/m2. Mild  increase in left ventricular thickness, with intraventricular septal thickness of 11 mm, posterior wall thickness of 8 mm, but myocardial mass index of 96 g/m2. Mild decrease left ventricular systolic function (LVEF =42%). There are no regional wall motion abnormalities but global hypokinesis. Left ventricular parametric mapping notable for ECV elevation in the RV insertion point (44%) and normal T2. ECV elevation can be seen in increased right ventricular filling pressures. There is no late gadolinium enhancement in the left ventricular myocardium. 2. Normal right ventricular size with RVEDVI 77 mL/m2. Normal right ventricular thickness. Normal right ventricular systolic function (RVEF =48%). There are no regional wall motion abnormalities or aneurysms. 3. Normal left and right atrial size. Atrial septal aneurysm. Cannot exclude small PFO. IVC is dilated, 25 mm. 4. Evidence of mild ascending aortic dilation, 43 mm. Moderate main pulmonary artery dilation, 32 mm Normal aortic root. 5. Valve assessment: Aortic Valve: Qualitatively there are multiple jets of aortic regurgitation. Moderate to severe regurgitation with regurgitant fraction 35%. Gradient 1.8 mm Hg. Pulmonic Valve: Qualitatively, there is no significant regurgitation. Regurgitant fraction 4%. Tricuspid Valve: Qualitatively, there is mild, central regurgitation. Regurgitant fraction 8%. Mitral Valve: Qualitatively, there is mild, central regurgitation. Regurgitant fraction 7%. 6.  Normal pericardium.  Trivial, inferior, pericardial effusion. 7. Grossly, no extracardiac findings. Recommended dedicated study if concerned for non-cardiac pathology. 8.  Breath hold artifacts noted. IMPRESSION: 1. Mild decrease left ventricular systolic function (LVEF =42%). 2. Moderate to severe regurgitation with regurgitant fraction 35% with moderate left ventricular dilation. This is markedly different that color Doppler evaluation on recent echocardiogram. Consider TEE  assessment as this MRI is consistent with aortic regurgitation associated left ventricular dysfunction. 3. Evidence of mild ascending aortic dilation, 43 mm. Gloriann Larger MD Electronically Signed   By: Gloriann Larger M.D.   On: 06/09/2023 12:55       Assessment &  Plan Chronic bilateral low back pain with bilateral sciatica Chronic low back and neck pain persists despite multiple surgeries and hardware placement. Current pain management includes oxycodone  10 mg three times daily, reducing pain by 60%. Previous spinal stimulators were unsuccessful due to scar tissue. Ongoing specialist support is required to continue opioid therapy. Refer to [facility] for opioid management support, acknowledging potential challenges due to [facility] scrutiny and patient demographics. Continue the current oxycodone  regimen and ensure follow-up with pain management specialists to transition opioid prescribing responsibility. Schedule a follow-up appointment within 30 days to reassess pain management and medication efficacy.  In my medical opinion this patient is appropriate for chronic opioid management of nonmalignant pain, and agreed to continue that management pending his willingness to continue trial of pain management clinics until one will accept into their care. High risk medication use PDMP reviewed during this encounter.  Chronic kidney disease, stage 3a (HCC) Chronic kidney disease, stage 3a, presents with a GFR of 44%, consistent with previous levels. A recent increase in creatinine was noted by cardiology, likely due to hypertension contributing to kidney function decline. The cardiologist adjusted medications for heart and kidney protection, though specific medications are not recalled. No new acute kidney injury is identified. Farxiga is prescribed to help protect the kidneys and manage hypertension, though cost may be a concern. Encourage checking for patient assistance programs for [place].  Request home blood pressure monitoring with results sent via MyChart. Review cardiology lab results and medication changes at the next follow-up. Lab Results  Component Value Date/Time   CREATININE 1.60 (H) 06/07/2023 09:20 AM   CREATININE 1.21 05/19/2023 10:42 AM   CREATININE 1.30 (H) 05/01/2023 11:04 AM   CREATININE 1.77 (H) 04/24/2023 03:05 AM   CREATININE 1.72 (H) 04/23/2023 02:37 AM   CREATININE 1.77 (H) 02/22/2023 09:54 AM   CREATININE 1.71 (H) 01/17/2023 10:39 AM   CREATININE 1.79 (H) 12/28/2022 11:08 AM  He is planned to get new labwork from cardiology soon... I am not sure if this is acute kidney injury or return of chronic kidney disease- after reviewing creat.  Neck pain  History of back surgery          Orders Placed During this Encounter:   Orders Placed This Encounter  Procedures   Ambulatory referral to Pain Clinic    Referral Priority:   Routine    Referral Type:   Consultation    Referral Reason:   Specialty Services Required    Requested Specialty:   Pain Medicine    Number of Visits Requested:   1   Meds ordered this encounter  Medications   Oxycodone  HCl 10 MG TABS    Sig: Take 1 tablet (10 mg total) by mouth every 8 (eight) hours as needed.    Dispense:  90 tablet    Refill:  0    Ok to fill now   empagliflozin  (JARDIANCE ) 10 MG TABS tablet    Sig: Take 1 tablet (10 mg total) by mouth daily before breakfast.    Dispense:  90 tablet    Refill:  3    Treatment plan discussed and reviewed in detail. Explained medication safety and potential side effects.  Answered all patient questions and confirmed understanding and comfort with the plan. Encouraged patient to contact our office if they have any questions or concerns.  Agreed on patient coming for a sooner office visit if symptoms worsen, persist, or new symptoms develop. Discussed precautions in case of needing to visit  the Emergency Department.     ----------------------------------------------------- Attestation:  Today's Healthcare Provider Anthon Kins, MD was located at office at [facility] [address], [place] [address].  The patient was located at home. All video encounter participant identities and locations confirmed visually and verbally.Today's Telemedicine visit was conducted via synchronous Video after consent for telemedicine was obtained:  Video connection was never lost    This document was transcribed and resynthesized, in part, by artificial intelligence ([employer]) using HIPAA-compliant recording of the clinical interaction;   We have discussed the our use of AI scribe software for clinical note transcription with the patient, who has given verbal consent to proceed.

## 2023-06-17 ENCOUNTER — Encounter: Payer: Self-pay | Admitting: Internal Medicine

## 2023-06-17 NOTE — Assessment & Plan Note (Signed)
PDMP reviewed during this encounter.  

## 2023-06-17 NOTE — Patient Instructions (Signed)
 It was a pleasure seeing you today! Your health and satisfaction are our top priorities.  Scherrie Curt, MD  Your Providers PCP: Anthon Kins, MD,  [phone]) Referring Provider: Anthon Kins, MD,  [phone]) Care Team Provider: Shirline Dover, DO,  [phone]) Care Team Provider: Ernie Heal, Jerelyn Money, MD,  [phone]) Care Team Provider: Kenney Peacemaker, MD,  [phone]) Care Team Provider: Elmyra Haggard, MD,  [phone])     NEXT STEPS: [x]  Early Intervention: Schedule sooner appointment, call our on-call services, or go to emergency room if there is any significant Increase in pain or discomfort New or worsening symptoms Sudden or severe changes in your health [x]  Flexible Follow-Up: We recommend a No follow-ups on file. for optimal routine care. This allows for progress monitoring and treatment adjustments. [x]  Preventive Care: Schedule your annual preventive care visit! It's typically covered by insurance and helps identify potential health issues early. [x]  Lab & X-ray Appointments: Incomplete tests scheduled today, or call to schedule. X-rays: [facility] (M-F, 8:30am-noon or 1pm-5pm). [x]  Medical Information Release: Sign a release form at front desk to obtain relevant medical information we don't have.  MAKING THE MOST OF OUR FOCUSED 20 MINUTE APPOINTMENTS: [x]   Clearly state your top concerns at the beginning of the visit to focus our discussion [x]   If you anticipate you will need more time, please inform the front desk during scheduling - we can book multiple appointments in the same week. [x]   If you have transportation problems- use our convenient video appointments or ask about transportation support. [x]   We can get down to business faster if you use MyChart to update information before the visit and submit non-urgent questions before your visit. Thank you for taking the time to provide details through MyChart.  Let our nurse  know and she can import this information into your encounter documents.  Arrival and Wait Times: [x]   Arriving on time ensures that everyone receives prompt attention. [x]   Early morning (8a) and afternoon (1p) appointments tend to have shortest wait times. [x]   Unfortunately, we cannot delay appointments for late arrivals or hold slots during phone calls.  Getting Answers and Following Up [x]   Simple Questions & Concerns: For quick questions or basic follow-up after your visit, reach us  at [phone] or MyChart messaging. [x]   Complex Concerns: If your concern is more complex, scheduling an appointment might be best. Discuss this with the staff to find the most suitable option. [x]   Lab & Imaging Results: We'll contact you directly if results are abnormal or you don't use MyChart. Most normal results will be on MyChart within 2-3 business days, with a review message from Dr. Boston Byers. Haven't heard back in 2 weeks? Need results sooner? Contact us  at [phone]. [x]   Referrals: Our referral coordinator will manage specialist referrals. The specialist's office should contact you within 2 weeks to schedule an appointment. Call us  if you haven't heard from them after 2 weeks.  Staying Connected [x]   MyChart: Activate your MyChart for the fastest way to access results and message us . See the last page of this paperwork for instructions on how to activate.  Bring to Your Next Appointment [x]   Medications: Please bring all your medication bottles to your next appointment to ensure we have an accurate record of your prescriptions. [x]   Health Diaries: If you're monitoring any health conditions at home, keeping a diary of your readings can be very helpful for discussions at your  next appointment.  Billing [x]   X-ray & Lab Orders: These are billed by separate companies. Contact the invoicing company directly for questions or concerns. [x]   Visit Charges: Discuss any billing inquiries with our  administrative services team.  Your Satisfaction Matters [x]   Share Your Experience: We strive for your satisfaction! If you have any complaints, or preferably compliments, please let Dr. Boston Byers know directly or contact our Practice Administrators, Olinda Bertrand or [employer], by asking at the front desk.   Reviewing Your Records [x]   Review this early draft of your clinical encounter notes below and the final encounter summary tomorrow on MyChart after its been completed.  All orders placed so far are visible here: Chronic bilateral low back pain with bilateral sciatica Assessment & Plan: Chronic low back and neck pain persists despite multiple surgeries and hardware placement. Current pain management includes oxycodone  10 mg three times daily, reducing pain by 60%. Previous spinal stimulators were unsuccessful due to scar tissue. Ongoing specialist support is required to continue opioid therapy. Refer to [facility] for opioid management support, acknowledging potential challenges due to [facility] scrutiny and patient demographics. Continue the current oxycodone  regimen and ensure follow-up with pain management specialists to transition opioid prescribing responsibility. Schedule a follow-up appointment within 30 days to reassess pain management and medication efficacy.  In my medical opinion this patient is appropriate for chronic opioid management of nonmalignant pain, and agreed to continue that management pending his willingness to continue trial of pain management clinics until one will accept into their care.  Orders: -     oxyCODONE  HCl; Take 1 tablet (10 mg total) by mouth every 8 (eight) hours as needed.  Dispense: 90 tablet; Refill: 0  High risk medication use Assessment & Plan: PDMP reviewed during this encounter.   Orders: -     Ambulatory referral to Pain Clinic  Chronic kidney disease, stage 3a (HCC) -     Empagliflozin ; Take 1 tablet (10 mg total) by mouth daily before  breakfast.  Dispense: 90 tablet; Refill: 3  Neck pain -     Ambulatory referral to Pain Clinic  History of back surgery -     Ambulatory referral to Pain Clinic

## 2023-06-17 NOTE — Assessment & Plan Note (Signed)
>>  ASSESSMENT AND PLAN FOR CHRONIC LOW BACK PAIN WRITTEN ON 06/17/2023  1:13 PM BY Izacc Demeyer G, MD  Chronic low back and neck pain persists despite multiple surgeries and hardware placement. Current pain management includes oxycodone  10 mg three times daily, reducing pain by 60%. Previous spinal stimulators were unsuccessful due to scar tissue. Ongoing specialist support is required to continue opioid therapy. Refer to [facility] for opioid management support, acknowledging potential challenges due to [facility] scrutiny and patient demographics. Continue the current oxycodone  regimen and ensure follow-up with pain management specialists to transition opioid prescribing responsibility. Schedule a follow-up appointment within 30 days to reassess pain management and medication efficacy.  In my medical opinion this patient is appropriate for chronic opioid management of nonmalignant pain, and agreed to continue that management pending his willingness to continue trial of pain management clinics until one will accept into their care.

## 2023-06-17 NOTE — Assessment & Plan Note (Signed)
 Chronic low back and neck pain persists despite multiple surgeries and hardware placement. Current pain management includes oxycodone  10 mg three times daily, reducing pain by 60%. Previous spinal stimulators were unsuccessful due to scar tissue. Ongoing specialist support is required to continue opioid therapy. Refer to [facility] for opioid management support, acknowledging potential challenges due to [facility] scrutiny and patient demographics. Continue the current oxycodone  regimen and ensure follow-up with pain management specialists to transition opioid prescribing responsibility. Schedule a follow-up appointment within 30 days to reassess pain management and medication efficacy.  In my medical opinion this patient is appropriate for chronic opioid management of nonmalignant pain, and agreed to continue that management pending his willingness to continue trial of pain management clinics until one will accept into their care.

## 2023-06-19 ENCOUNTER — Other Ambulatory Visit

## 2023-06-19 ENCOUNTER — Encounter: Admitting: Internal Medicine

## 2023-06-21 ENCOUNTER — Ambulatory Visit: Payer: Self-pay | Admitting: Internal Medicine

## 2023-06-21 ENCOUNTER — Other Ambulatory Visit (INDEPENDENT_AMBULATORY_CARE_PROVIDER_SITE_OTHER)

## 2023-06-21 DIAGNOSIS — D649 Anemia, unspecified: Secondary | ICD-10-CM | POA: Diagnosis not present

## 2023-06-21 DIAGNOSIS — N183 Chronic kidney disease, stage 3 unspecified: Secondary | ICD-10-CM

## 2023-06-21 LAB — CBC WITH DIFFERENTIAL/PLATELET
Basophils Absolute: 0 10*3/uL (ref 0.0–0.1)
Basophils Relative: 0.6 % (ref 0.0–3.0)
Eosinophils Absolute: 0.9 10*3/uL — ABNORMAL HIGH (ref 0.0–0.7)
Eosinophils Relative: 14.6 % — ABNORMAL HIGH (ref 0.0–5.0)
HCT: 30.4 % — ABNORMAL LOW (ref 39.0–52.0)
Hemoglobin: 10.2 g/dL — ABNORMAL LOW (ref 13.0–17.0)
Lymphocytes Relative: 29.5 % (ref 12.0–46.0)
Lymphs Abs: 1.8 10*3/uL (ref 0.7–4.0)
MCHC: 33.6 g/dL (ref 30.0–36.0)
MCV: 88.4 fl (ref 78.0–100.0)
Monocytes Absolute: 0.5 10*3/uL (ref 0.1–1.0)
Monocytes Relative: 8.8 % (ref 3.0–12.0)
Neutro Abs: 2.8 10*3/uL (ref 1.4–7.7)
Neutrophils Relative %: 46.5 % (ref 43.0–77.0)
Platelets: 178 10*3/uL (ref 150.0–400.0)
RBC: 3.44 Mil/uL — ABNORMAL LOW (ref 4.22–5.81)
RDW: 15.2 % (ref 11.5–15.5)
WBC: 6 10*3/uL (ref 4.0–10.5)

## 2023-06-21 LAB — COMPLETE METABOLIC PANEL WITHOUT GFR
AG Ratio: 1.1 (calc) (ref 1.0–2.5)
ALT: 6 U/L — ABNORMAL LOW (ref 9–46)
AST: 20 U/L (ref 10–35)
Albumin: 3.6 g/dL (ref 3.6–5.1)
Alkaline phosphatase (APISO): 82 U/L (ref 35–144)
BUN/Creatinine Ratio: 17 (calc) (ref 6–22)
BUN: 32 mg/dL — ABNORMAL HIGH (ref 7–25)
CO2: 22 mmol/L (ref 20–32)
Calcium: 8.8 mg/dL (ref 8.6–10.3)
Chloride: 101 mmol/L (ref 98–110)
Creat: 1.87 mg/dL — ABNORMAL HIGH (ref 0.70–1.28)
Globulin: 3.4 g/dL (ref 1.9–3.7)
Glucose, Bld: 98 mg/dL (ref 65–99)
Potassium: 4 mmol/L (ref 3.5–5.3)
Sodium: 133 mmol/L — ABNORMAL LOW (ref 135–146)
Total Bilirubin: 0.3 mg/dL (ref 0.2–1.2)
Total Protein: 7 g/dL (ref 6.1–8.1)

## 2023-06-21 NOTE — Progress Notes (Signed)
 Lab shows good improvement in blood counts but lots of eosinophils.. this is either due to allergies or parasites. If there are no allergies then we should consider testing for parasites, especially if gastrointestinal problems.

## 2023-06-22 NOTE — Progress Notes (Signed)
 Kidney filtration rate is a little slower than last time- and sodium is lower.  I recommend rechecking these within 1-2 weeks, and prior to that drinking salty fluid.... this situation is probably from the medications we started during hospitalization and Jardiance  and hopefully will self correct within a couple weeks.  Important to avoid ibuprofen and aleve right now.

## 2023-06-28 ENCOUNTER — Other Ambulatory Visit: Payer: Self-pay | Admitting: Internal Medicine

## 2023-06-28 DIAGNOSIS — D539 Nutritional anemia, unspecified: Secondary | ICD-10-CM

## 2023-06-29 ENCOUNTER — Inpatient Hospital Stay: Admitting: Internal Medicine

## 2023-06-29 ENCOUNTER — Inpatient Hospital Stay: Attending: Internal Medicine

## 2023-06-29 VITALS — BP 171/51 | HR 76 | Temp 98.4°F | Resp 18 | Ht 71.0 in | Wt 148.6 lb

## 2023-06-29 DIAGNOSIS — N189 Chronic kidney disease, unspecified: Secondary | ICD-10-CM

## 2023-06-29 DIAGNOSIS — J449 Chronic obstructive pulmonary disease, unspecified: Secondary | ICD-10-CM | POA: Insufficient documentation

## 2023-06-29 DIAGNOSIS — I130 Hypertensive heart and chronic kidney disease with heart failure and stage 1 through stage 4 chronic kidney disease, or unspecified chronic kidney disease: Secondary | ICD-10-CM | POA: Insufficient documentation

## 2023-06-29 DIAGNOSIS — D500 Iron deficiency anemia secondary to blood loss (chronic): Secondary | ICD-10-CM

## 2023-06-29 DIAGNOSIS — I5042 Chronic combined systolic (congestive) and diastolic (congestive) heart failure: Secondary | ICD-10-CM

## 2023-06-29 DIAGNOSIS — D631 Anemia in chronic kidney disease: Secondary | ICD-10-CM | POA: Insufficient documentation

## 2023-06-29 DIAGNOSIS — Z87891 Personal history of nicotine dependence: Secondary | ICD-10-CM | POA: Diagnosis not present

## 2023-06-29 DIAGNOSIS — D539 Nutritional anemia, unspecified: Secondary | ICD-10-CM

## 2023-06-29 DIAGNOSIS — Z79899 Other long term (current) drug therapy: Secondary | ICD-10-CM

## 2023-06-29 LAB — CBC WITH DIFFERENTIAL (CANCER CENTER ONLY)
Abs Immature Granulocytes: 0.01 10*3/uL (ref 0.00–0.07)
Basophils Absolute: 0 10*3/uL (ref 0.0–0.1)
Basophils Relative: 1 %
Eosinophils Absolute: 0.7 10*3/uL — ABNORMAL HIGH (ref 0.0–0.5)
Eosinophils Relative: 10 %
HCT: 31 % — ABNORMAL LOW (ref 39.0–52.0)
Hemoglobin: 10.2 g/dL — ABNORMAL LOW (ref 13.0–17.0)
Immature Granulocytes: 0 %
Lymphocytes Relative: 23 %
Lymphs Abs: 1.5 10*3/uL (ref 0.7–4.0)
MCH: 29.1 pg (ref 26.0–34.0)
MCHC: 32.9 g/dL (ref 30.0–36.0)
MCV: 88.6 fL (ref 80.0–100.0)
Monocytes Absolute: 0.4 10*3/uL (ref 0.1–1.0)
Monocytes Relative: 7 %
Neutro Abs: 4 10*3/uL (ref 1.7–7.7)
Neutrophils Relative %: 59 %
Platelet Count: 165 10*3/uL (ref 150–400)
RBC: 3.5 MIL/uL — ABNORMAL LOW (ref 4.22–5.81)
RDW: 14.2 % (ref 11.5–15.5)
WBC Count: 6.6 10*3/uL (ref 4.0–10.5)
nRBC: 0 % (ref 0.0–0.2)

## 2023-06-29 LAB — TSH: TSH: 2.81 u[IU]/mL (ref 0.350–4.500)

## 2023-06-29 LAB — CMP (CANCER CENTER ONLY)
ALT: 10 U/L (ref 0–44)
AST: 26 U/L (ref 15–41)
Albumin: 4 g/dL (ref 3.5–5.0)
Alkaline Phosphatase: 110 U/L (ref 38–126)
Anion gap: 7 (ref 5–15)
BUN: 27 mg/dL — ABNORMAL HIGH (ref 8–23)
CO2: 26 mmol/L (ref 22–32)
Calcium: 8.9 mg/dL (ref 8.9–10.3)
Chloride: 102 mmol/L (ref 98–111)
Creatinine: 1.77 mg/dL — ABNORMAL HIGH (ref 0.61–1.24)
GFR, Estimated: 39 mL/min — ABNORMAL LOW (ref >60)
Glucose, Bld: 155 mg/dL — ABNORMAL HIGH (ref 70–99)
Potassium: 4.2 mmol/L (ref 3.5–5.1)
Sodium: 135 mmol/L (ref 135–145)
Total Bilirubin: 0.4 mg/dL (ref 0.0–1.2)
Total Protein: 7.4 g/dL (ref 6.5–8.1)

## 2023-06-29 LAB — VITAMIN B12: Vitamin B-12: 245 pg/mL (ref 180–914)

## 2023-06-29 LAB — IRON AND IRON BINDING CAPACITY (CC-WL,HP ONLY)
Iron: 71 ug/dL (ref 45–182)
Saturation Ratios: 27 % (ref 17.9–39.5)
TIBC: 259 ug/dL (ref 250–450)
UIBC: 188 ug/dL (ref 117–376)

## 2023-06-29 LAB — FOLATE: Folate: 13.6 ng/mL (ref >5.9)

## 2023-06-29 LAB — FERRITIN: Ferritin: 268 ng/mL (ref 24–336)

## 2023-06-29 LAB — LACTATE DEHYDROGENASE: LDH: 209 U/L — ABNORMAL HIGH (ref 98–192)

## 2023-06-29 MED ORDER — INTEGRA PLUS PO CAPS: 1.0000 | ORAL_CAPSULE | Freq: Every day | ORAL | 5 refills | Status: DC

## 2023-06-29 NOTE — Progress Notes (Signed)
 [facility] [phone]   [phone]  CONSULT NOTE  REFERRING PHYSICIAN: Dr. Scherrie Curt  REASON FOR CONSULTATION:  78 years old white male with persistent anemia.  HPI Michael Bender is a 78 y.o. male came to the clinic today accompanied by his son for initial evaluation of persistent anemia. Discussed the use of AI scribe software for clinical note transcription with the patient, who gave verbal consent to proceed.  History of Present Illness   Michael Bender is a 78 year old male with persistent anemia who presents for hematology consultation. He was referred by his family doctor for evaluation of persistent anemia.  He has experienced persistent anemia for several years. Initial investigations in February 2021 revealed gastrointestinal issues, including three polyps in the colon, which were removed during a colonoscopy. Despite this intervention, he continued to have bleeding issues, leading to an upper endoscopy in August 2021, which identified non-bleeding duodenal ulcers.  Recent blood work from May 01, 2023, showed a hemoglobin level of 9.0 and hematocrit of 28.2, with normal mean corpuscular volume and mean corpuscular hemoglobin concentration. His serum iron was 42, and ferritin was normal at 92. He has not been taking iron supplements.  No symptoms such as fatigue, bleeding, or dietary restrictions. He eats without restrictions on meats. No recent weight loss, chest pain, shortness of breath, or gastrointestinal symptoms like nausea, vomiting, diarrhea, or constipation.  His past medical history includes congestive heart failure and COPD. 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 He has a history of smoking but quit several years ago and does not consume alcohol or use street drugs. He is allergic to Nuvane.      HPI  Past Medical History:  Diagnosis Date   Acute combined systolic and diastolic heart failure (HCC)  45/40/9811   Acute on chronic diastolic CHF (congestive heart failure) (HCC) 04/20/2023   Arthritis    Back   Balance problems 02/21/2023   Blood transfusion    as a child   Chronic back pain    COPD (chronic obstructive pulmonary disease) (HCC)    Disturbance of skin sensation 05/22/2018   Effusion of right knee 02/21/2023   Effusion of right knee joint 01/16/2023   Elevated troponin 04/22/2023   Essential hypertension 03/05/2007   Qualifier: Diagnosis of   By: Ena Harries MD, Viktoria Gray      Finger pain, left 11/08/2022   Gastric erosion    Gastritis and gastroduodenitis    Gastrointestinal hemorrhage with melena 11/20/2018   GIB (gastrointestinal bleeding) 07/18/2019   Hardware complicating wound infection (HCC) 11/10/2022   Headache(784.0)    last one 6 months ago   Hemorrhoids, internal    High risk medication use 03/03/2022   History of duodenal ulcer    History of lumbar fusion 03/03/2022   RE-OPERATIVE DIAGNOSIS:  lumbar stenosis synovial cyst lumbar spondylosis spondylolisthesis lumbar radiculoapthy L4/5   PROCEDURE:  Procedure(s): POSTERIOR LUMBAR FUSION 1 LEVEL with resection of synovial cyst     History of upper gastrointestinal bleeding 03/03/2022   Duodenal ulcer 2021 Dr. Willy Harvest   HLD (hyperlipidemia)    Hypertension    Hypokalemia 04/22/2023   Hypomagnesemia 04/22/2023   Infected blister of left index finger 11/07/2022   Intractable pain 03/03/2022   Loss of weight    Wt Readings from Last 10 Encounters:  04/05/22  146 lb (66.2 kg)  03/03/22  143 lb 9.6 oz (65.1 kg)  07/14/21  153 lb (69.4 kg)  12/14/20  147 lb 12.8 oz (67 kg)  11/13/19  154 lb (69.9 kg)  09/16/19  146 lb (66.2 kg)  08/06/19  146 lb (66.2 kg)  07/19/19  144 lb 13.5 oz (65.7 kg)  07/17/19  148 lb (67.1 kg)  07/09/19  147 lb 9.6 oz (67 kg)         MSSA bacteremia 11/07/2022   Neck rigidity    post cervical fusion   Nocturia    PAIN, CHRONIC NEC 10/06/2006   Qualifier: Diagnosis of   By: Ena Harries RN,  Willetta Harpin       Pneumonia    Prostate disease    Right sided temporal headache 05/22/2018   S/P cervical spinal fusion 03/03/2022   With persistent cervical pain and palpable screws Led to disability History of attempt to dig furrow in skull to fix it Last surgery 1992   Senile ecchymosis 01/21/2019   Septic arthritis of wrist, left (HCC) 11/08/2022   Septic infrapatellar bursitis of right knee 11/07/2022   Staphylococcal Arthritis of Right Knee (Updated 01/30/2023) MRI 01/17/2023 shows worsening findings:  Worsening tear of posterior horn medial meniscus with large radial component Worsening subcortical stress fracture/osteochondral lesion of medial femoral condyle New subcortical stress fracture/osteochondral lesion of medial tibial plateau Moderate-to-large effusion with worsened synovitis    Staphylococcal arthritis of left wrist (HCC) 11/07/2022   Staphylococcal arthritis of right knee (HCC) 11/08/2022   Syncope 11/05/2022    Past Surgical History:  Procedure Laterality Date   BIOPSY  07/19/2019   Procedure: BIOPSY;  Surgeon: Annis Kinder, DO;  Location: [facility];  Service: Gastroenterology;;   CERVICAL FUSION  1992   C2/C 3  four surgeries   COLONOSCOPY     ESOPHAGOGASTRODUODENOSCOPY  07/20/2011   Procedure: ESOPHAGOGASTRODUODENOSCOPY (EGD);  Surgeon: Kenney Peacemaker, MD;  Location: [facility];  Service: Endoscopy;  Laterality: N/A;   ESOPHAGOGASTRODUODENOSCOPY (EGD) WITH PROPOFOL  N/A 07/19/2019   Procedure: ESOPHAGOGASTRODUODENOSCOPY (EGD) WITH PROPOFOL ;  Surgeon: Annis Kinder, DO;  Location: [facility];  Service: Gastroenterology;  Laterality: N/A;   I & D EXTREMITY Left 11/09/2022   Procedure: IRRIGATION AND DEBRIDEMENT LEFT WRIST;  Surgeon: Merrill Abide, MD;  Location: [facility];  Service: Orthopedics;  Laterality: Left;   SAVORY DILATION  07/20/2011   Procedure: SAVORY DILATION;  Surgeon: Kenney Peacemaker, MD;  Location: [facility];  Service: Endoscopy;   Laterality: N/A;   SPINAL FUSION  05/06/11    Family History  Problem Relation Age of Onset   Heart disease Mother    Heart attack Mother 65   Pancreatic cancer Father 31   Pancreatic cancer Brother    Anesthesia problems Neg Hx    Colon cancer Neg Hx    Liver cancer Neg Hx    Stomach cancer Neg Hx    Esophageal cancer Neg Hx    Rectal cancer Neg Hx     Social History Social History   Tobacco Use   Smoking status: Former    Current packs/day: 0.00    Types: Cigarettes    Start date: 12/12/1978    Quit date: 12/12/2018    Years since quitting: 4.5   Smokeless tobacco: Never  Vaping Use   Vaping status: Never Used  Substance Use Topics   Alcohol use: Not Currently    Alcohol/week: 2.0 standard drinks of alcohol    Types: 2 Shots of liquor per week   Drug use: No    Allergies  Allergen Reactions  Nubain [Nalbuphine Hcl]     Muscle contraction    Current Outpatient Medications  Medication Sig Dispense Refill   amitriptyline  (ELAVIL ) 50 MG tablet Take 1 tablet (50 mg total) by mouth at bedtime. 90 tablet 3   amLODipine  (NORVASC ) 5 MG tablet Take 1 tablet (5 mg total) by mouth daily. 30 tablet 0   cyclobenzaprine  (FLEXERIL ) 10 MG tablet Take 1 tablet (10 mg total) by mouth 3 (three) times daily as needed. 270 tablet 3   empagliflozin  (JARDIANCE ) 10 MG TABS tablet Take 1 tablet (10 mg total) by mouth daily before breakfast. 90 tablet 3   Ferric Maltol  (ACCRUFER ) 30 MG CAPS Take 1 capsule (30 mg total) by mouth 2 (two) times daily. Recheck iron to decide how long to continue (Patient taking differently: Take 1 capsule by mouth 2 (two) times daily. Recheck iron to decide how long to continue- On hold) 180 capsule 3   hydrALAZINE  (APRESOLINE ) 50 MG tablet Take 1 tablet (50 mg total) by mouth every 8 (eight) hours. 90 tablet 0   ipratropium-albuterol  (DUONEB) 0.5-2.5 (3) MG/3ML SOLN Take 3 mLs by nebulization every 4 (four) hours as needed. 90 mL 1   isosorbide  mononitrate  (IMDUR ) 60 MG 24 hr tablet Take 1 tablet (60 mg total) by mouth daily. 30 tablet 0   Oxycodone  HCl 10 MG TABS Take 1 tablet (10 mg total) by mouth every 8 (eight) hours as needed. 90 tablet 0   pantoprazole  (PROTONIX ) 40 MG tablet Take 1 tablet (40 mg total) by mouth daily. 90 tablet 3   polyethylene glycol (MIRALAX  / GLYCOLAX ) 17 g packet Take 17 g by mouth daily as needed for moderate constipation. 14 each 0   Respiratory Therapy Supplies (NEBULIZER/TUBING/MOUTHPIECE) KIT 1 each by Does not apply route every 4 (four) hours as needed. 1 kit 1   Respiratory Therapy Supplies (NEBULIZER/TUBING/MOUTHPIECE) KIT 1 each by Does not apply route every 4 (four) hours as needed. 1 kit 1   rosuvastatin  (CRESTOR ) 40 MG tablet TAKE 1 TABLET BY MOUTH DAILY. REPLACES ATORVASTATIN  (STOP ATORVASTATIN  IF STILL TAKING) 90 tablet 3   sacubitril-valsartan (ENTRESTO ) 49-51 MG Take 1 tablet by mouth 2 (two) times daily. 180 tablet 2   SUMAtriptan  (IMITREX ) 50 MG tablet TAKE 1 TABLET (50 MG TOTAL) BY MOUTH DAILY. MAY REPEAT IN 2 HOURS IF HEADACHE PERSISTS OR RECURS. 9 tablet 1   No current facility-administered medications for this visit.    Review of Systems  Constitutional: negative Eyes: negative Ears, nose, mouth, throat, and face: negative Respiratory: negative Cardiovascular: negative Gastrointestinal: negative Genitourinary:negative Integument/breast: negative Hematologic/lymphatic: negative Musculoskeletal:negative Neurological: negative Behavioral/Psych: negative Endocrine: negative Allergic/Immunologic: negative  Physical Exam  ZOX:WRUEA, healthy, no distress, well nourished, and well developed SKIN: skin color, texture, turgor are normal, no rashes or significant lesions HEAD: Normocephalic, No masses, lesions, tenderness or abnormalities EYES: normal, PERRLA, Conjunctiva are pink and non-injected EARS: External ears normal, Canals clear OROPHARYNX:no exudate, no erythema, and lips, buccal  mucosa, and tongue normal  NECK: supple, no adenopathy, no JVD LYMPH:  no palpable lymphadenopathy, no hepatosplenomegaly LUNGS: clear to auscultation , and palpation HEART: regular rate & rhythm, no murmurs, and no gallops ABDOMEN:abdomen soft, non-tender, normal bowel sounds, and no masses or organomegaly BACK: Back symmetric, no curvature., No CVA tenderness EXTREMITIES:no joint deformities, effusion, or inflammation, no edema  NEURO: alert & oriented x 3 with fluent speech, no focal motor/sensory deficits  PERFORMANCE STATUS: ECOG 1  LABORATORY DATA: Lab Results  Component Value Date  WBC 6.6 06/29/2023   HGB 10.2 (L) 06/29/2023   HCT 31.0 (L) 06/29/2023   MCV 88.6 06/29/2023   PLT 165 06/29/2023      Chemistry      Component Value Date/Time   NA 135 06/29/2023 1126   NA 140 06/07/2023 0920   K 4.2 06/29/2023 1126   CL 102 06/29/2023 1126   CO2 26 06/29/2023 1126   BUN 27 (H) 06/29/2023 1126   BUN 23 06/07/2023 0920   CREATININE 1.77 (H) 06/29/2023 1126   CREATININE 1.87 (H) 06/21/2023 1209      Component Value Date/Time   CALCIUM  8.9 06/29/2023 1126   ALKPHOS 110 06/29/2023 1126   AST 26 06/29/2023 1126   ALT 10 06/29/2023 1126   BILITOT 0.4 06/29/2023 1126       RADIOGRAPHIC STUDIES: MR CARDIAC MORPHOLOGY W WO CONTRAST Result Date: 06/09/2023 CLINICAL DATA:  Clinical question of Heart Failure Study assumes HCT of 28.2  And BSA of 1.89 m2. EXAM: CARDIAC MRI TECHNIQUE: The patient was scanned on a 1.5 Tesla GE magnet. A dedicated cardiac coil was used. Functional imaging was done using Fiesta sequences. 2,3, and 4 chamber views were done to assess for RWMA's. Modified Simpson's rule using was used to calculate an ejection fraction on a dedicated work [employer] officer, [employer]. The patient received 10 cc of Gadavist . After 10 minutes inversion recovery sequences were used to assess for infiltration and scar tissue. Flow quantification was performed 2 times during  this examination with flow quantification performed at the levels of the ascending aorta above the valve, pulmonary artery above the valve. CONTRAST:  10 cc  of Gadavist  FINDINGS: 1. Moderately dilated left ventricular size, with LVEDD 56 mm, but LVEDVi 120 mL/m2. Mild increase in left ventricular thickness, with intraventricular septal thickness of 11 mm, posterior wall thickness of 8 mm, but myocardial mass index of 96 g/m2. Mild decrease left ventricular systolic function (LVEF =42%). There are no regional wall motion abnormalities but global hypokinesis. Left ventricular parametric mapping notable for ECV elevation in the RV insertion point (44%) and normal T2. ECV elevation can be seen in increased right ventricular filling pressures. There is no late gadolinium enhancement in the left ventricular myocardium. 2. Normal right ventricular size with RVEDVI 77 mL/m2. Normal right ventricular thickness. Normal right ventricular systolic function (RVEF =48%). There are no regional wall motion abnormalities or aneurysms. 3. Normal left and right atrial size. Atrial septal aneurysm. Cannot exclude small PFO. IVC is dilated, 25 mm. 4. Evidence of mild ascending aortic dilation, 43 mm. Moderate main pulmonary artery dilation, 32 mm Normal aortic root. 5. Valve assessment: Aortic Valve: Qualitatively there are multiple jets of aortic regurgitation. Moderate to severe regurgitation with regurgitant fraction 35%. Gradient 1.8 mm Hg. Pulmonic Valve: Qualitatively, there is no significant regurgitation. Regurgitant fraction 4%. Tricuspid Valve: Qualitatively, there is mild, central regurgitation. Regurgitant fraction 8%. Mitral Valve: Qualitatively, there is mild, central regurgitation. Regurgitant fraction 7%. 6.  Normal pericardium.  Trivial, inferior, pericardial effusion. 7. Grossly, no extracardiac findings. Recommended dedicated study if concerned for non-cardiac pathology. 8.  Breath hold artifacts noted. IMPRESSION:  1. Mild decrease left ventricular systolic function (LVEF =42%). 2. Moderate to severe regurgitation with regurgitant fraction 35% with moderate left ventricular dilation. This is markedly different that color Doppler evaluation on recent echocardiogram. Consider TEE assessment as this MRI is consistent with aortic regurgitation associated left ventricular dysfunction. 3. Evidence of mild ascending aortic dilation, 43 mm. Gloriann Larger MD Electronically  Signed   By: Gloriann Larger M.D.   On: 06/09/2023 12:55   MR CARDIAC VELOCITY FLOW MAP Result Date: 06/09/2023 CLINICAL DATA:  Clinical question of Heart Failure Study assumes HCT of 28.2  And BSA of 1.89 m2. EXAM: CARDIAC MRI TECHNIQUE: The patient was scanned on a 1.5 Tesla GE magnet. A dedicated cardiac coil was used. Functional imaging was done using Fiesta sequences. 2,3, and 4 chamber views were done to assess for RWMA's. Modified Simpson's rule using was used to calculate an ejection fraction on a dedicated work [employer] officer, [employer]. The patient received 10 cc of Gadavist . After 10 minutes inversion recovery sequences were used to assess for infiltration and scar tissue. Flow quantification was performed 2 times during this examination with flow quantification performed at the levels of the ascending aorta above the valve, pulmonary artery above the valve. CONTRAST:  10 cc  of Gadavist  FINDINGS: 1. Moderately dilated left ventricular size, with LVEDD 56 mm, but LVEDVi 120 mL/m2. Mild increase in left ventricular thickness, with intraventricular septal thickness of 11 mm, posterior wall thickness of 8 mm, but myocardial mass index of 96 g/m2. Mild decrease left ventricular systolic function (LVEF =42%). There are no regional wall motion abnormalities but global hypokinesis. Left ventricular parametric mapping notable for ECV elevation in the RV insertion point (44%) and normal T2. ECV elevation can be seen in increased right  ventricular filling pressures. There is no late gadolinium enhancement in the left ventricular myocardium. 2. Normal right ventricular size with RVEDVI 77 mL/m2. Normal right ventricular thickness. Normal right ventricular systolic function (RVEF =48%). There are no regional wall motion abnormalities or aneurysms. 3. Normal left and right atrial size. Atrial septal aneurysm. Cannot exclude small PFO. IVC is dilated, 25 mm. 4. Evidence of mild ascending aortic dilation, 43 mm. Moderate main pulmonary artery dilation, 32 mm Normal aortic root. 5. Valve assessment: Aortic Valve: Qualitatively there are multiple jets of aortic regurgitation. Moderate to severe regurgitation with regurgitant fraction 35%. Gradient 1.8 mm Hg. Pulmonic Valve: Qualitatively, there is no significant regurgitation. Regurgitant fraction 4%. Tricuspid Valve: Qualitatively, there is mild, central regurgitation. Regurgitant fraction 8%. Mitral Valve: Qualitatively, there is mild, central regurgitation. Regurgitant fraction 7%. 6.  Normal pericardium.  Trivial, inferior, pericardial effusion. 7. Grossly, no extracardiac findings. Recommended dedicated study if concerned for non-cardiac pathology. 8.  Breath hold artifacts noted. IMPRESSION: 1. Mild decrease left ventricular systolic function (LVEF =42%). 2. Moderate to severe regurgitation with regurgitant fraction 35% with moderate left ventricular dilation. This is markedly different that color Doppler evaluation on recent echocardiogram. Consider TEE assessment as this MRI is consistent with aortic regurgitation associated left ventricular dysfunction. 3. Evidence of mild ascending aortic dilation, 43 mm. Gloriann Larger MD Electronically Signed   By: Gloriann Larger M.D.   On: 06/09/2023 12:55   MR CARDIAC VELOCITY FLOW MAP Result Date: 06/09/2023 CLINICAL DATA:  Clinical question of Heart Failure Study assumes HCT of 28.2  And BSA of 1.89 m2. EXAM: CARDIAC MRI TECHNIQUE: The  patient was scanned on a 1.5 Tesla GE magnet. A dedicated cardiac coil was used. Functional imaging was done using Fiesta sequences. 2,3, and 4 chamber views were done to assess for RWMA's. Modified Simpson's rule using was used to calculate an ejection fraction on a dedicated work [employer] officer, [employer]. The patient received 10 cc of Gadavist . After 10 minutes inversion recovery sequences were used to assess for infiltration and scar tissue. Flow quantification was performed 2  times during this examination with flow quantification performed at the levels of the ascending aorta above the valve, pulmonary artery above the valve. CONTRAST:  10 cc  of Gadavist  FINDINGS: 1. Moderately dilated left ventricular size, with LVEDD 56 mm, but LVEDVi 120 mL/m2. Mild increase in left ventricular thickness, with intraventricular septal thickness of 11 mm, posterior wall thickness of 8 mm, but myocardial mass index of 96 g/m2. Mild decrease left ventricular systolic function (LVEF =42%). There are no regional wall motion abnormalities but global hypokinesis. Left ventricular parametric mapping notable for ECV elevation in the RV insertion point (44%) and normal T2. ECV elevation can be seen in increased right ventricular filling pressures. There is no late gadolinium enhancement in the left ventricular myocardium. 2. Normal right ventricular size with RVEDVI 77 mL/m2. Normal right ventricular thickness. Normal right ventricular systolic function (RVEF =48%). There are no regional wall motion abnormalities or aneurysms. 3. Normal left and right atrial size. Atrial septal aneurysm. Cannot exclude small PFO. IVC is dilated, 25 mm. 4. Evidence of mild ascending aortic dilation, 43 mm. Moderate main pulmonary artery dilation, 32 mm Normal aortic root. 5. Valve assessment: Aortic Valve: Qualitatively there are multiple jets of aortic regurgitation. Moderate to severe regurgitation with regurgitant fraction 35%. Gradient 1.8 mm  Hg. Pulmonic Valve: Qualitatively, there is no significant regurgitation. Regurgitant fraction 4%. Tricuspid Valve: Qualitatively, there is mild, central regurgitation. Regurgitant fraction 8%. Mitral Valve: Qualitatively, there is mild, central regurgitation. Regurgitant fraction 7%. 6.  Normal pericardium.  Trivial, inferior, pericardial effusion. 7. Grossly, no extracardiac findings. Recommended dedicated study if concerned for non-cardiac pathology. 8.  Breath hold artifacts noted. IMPRESSION: 1. Mild decrease left ventricular systolic function (LVEF =42%). 2. Moderate to severe regurgitation with regurgitant fraction 35% with moderate left ventricular dilation. This is markedly different that color Doppler evaluation on recent echocardiogram. Consider TEE assessment as this MRI is consistent with aortic regurgitation associated left ventricular dysfunction. 3. Evidence of mild ascending aortic dilation, 43 mm. Gloriann Larger MD Electronically Signed   By: Gloriann Larger M.D.   On: 06/09/2023 12:55    ASSESSMENT AND PLAN: Assessment and Plan    Anemia Persistent anemia with a history of gastrointestinal bleeding and non-bleeding duodenal ulcer. Hemoglobin improved from 9.0 g/dL on March 31 to 16.1 g/dL on May 21. Iron studies show improvement with serum iron at 71 mcg/dL. Anemia likely multifactorial, including iron deficiency and anemia of chronic disease due to chronic kidney disease and congestive heart failure. Asymptomatic from anemia. Iron infusion has improved iron levels, but maintenance with oral iron is recommended. - Prescribe Integra plus capsules, one daily with food. - Monitor hemoglobin and iron studies every six months. - Advise to report increased fatigue or symptoms for earlier evaluation and possible iron infusion. - Consider prescription iron if over-the-counter iron causes gastrointestinal upset.  Chronic Kidney Disease Chronic kidney disease contributing to  anemia as anemia of chronic disease. Kidney function has been monitored since hospital admission in September of last year.  Congestive Heart Failure Congestive heart failure contributing to anemia as anemia of chronic disease. No current symptoms reported.  Chronic Obstructive Pulmonary Disease (COPD) COPD with no current respiratory symptoms reported.   The patient was advised to call immediately if he has any concerning symptoms in the interval. The patient voices understanding of current disease status and treatment options and is in agreement with the current care plan.  All questions were answered. The patient knows to call the clinic with any  problems, questions or concerns. We can certainly see the patient much sooner if necessary.  Thank you so much for allowing me to participate in the care of Michael Bender. I will continue to follow up the patient with you and assist in his care.  The total time spent in the appointment was 60 minutes including review of chart and various tests results, discussions about plan of care and coordination of care plan .   Disclaimer: This note was dictated with voice recognition software. Similar sounding words can inadvertently be transcribed and may not be corrected upon review.   Aurelio Blower Jun 29, 2023, 12:20 PM

## 2023-07-03 LAB — PROTEIN ELECTROPHORESIS, SERUM, WITH REFLEX
A/G Ratio: 0.9 (ref 0.7–1.7)
Albumin ELP: 3.3 g/dL (ref 2.9–4.4)
Alpha-1-Globulin: 0.3 g/dL (ref 0.0–0.4)
Alpha-2-Globulin: 0.8 g/dL (ref 0.4–1.0)
Beta Globulin: 1 g/dL (ref 0.7–1.3)
Gamma Globulin: 1.4 g/dL (ref 0.4–1.8)
Globulin, Total: 3.5 g/dL (ref 2.2–3.9)
Total Protein ELP: 6.8 g/dL (ref 6.0–8.5)

## 2023-07-14 ENCOUNTER — Encounter: Payer: Self-pay | Admitting: Internal Medicine

## 2023-07-14 ENCOUNTER — Ambulatory Visit (INDEPENDENT_AMBULATORY_CARE_PROVIDER_SITE_OTHER): Admitting: Internal Medicine

## 2023-07-14 VITALS — BP 138/88 | HR 69 | Temp 98.4°F | Ht 71.0 in | Wt 150.2 lb

## 2023-07-14 DIAGNOSIS — D649 Anemia, unspecified: Secondary | ICD-10-CM | POA: Diagnosis not present

## 2023-07-14 DIAGNOSIS — R64 Cachexia: Secondary | ICD-10-CM | POA: Diagnosis not present

## 2023-07-14 DIAGNOSIS — Z79899 Other long term (current) drug therapy: Secondary | ICD-10-CM

## 2023-07-14 DIAGNOSIS — W57XXXA Bitten or stung by nonvenomous insect and other nonvenomous arthropods, initial encounter: Secondary | ICD-10-CM

## 2023-07-14 DIAGNOSIS — M542 Cervicalgia: Secondary | ICD-10-CM | POA: Diagnosis not present

## 2023-07-14 DIAGNOSIS — I5033 Acute on chronic diastolic (congestive) heart failure: Secondary | ICD-10-CM | POA: Diagnosis not present

## 2023-07-14 DIAGNOSIS — N1831 Chronic kidney disease, stage 3a: Secondary | ICD-10-CM | POA: Diagnosis not present

## 2023-07-14 DIAGNOSIS — M5441 Lumbago with sciatica, right side: Secondary | ICD-10-CM

## 2023-07-14 DIAGNOSIS — M5442 Lumbago with sciatica, left side: Secondary | ICD-10-CM

## 2023-07-14 DIAGNOSIS — G8929 Other chronic pain: Secondary | ICD-10-CM

## 2023-07-14 DIAGNOSIS — R413 Other amnesia: Secondary | ICD-10-CM

## 2023-07-14 DIAGNOSIS — S30860A Insect bite (nonvenomous) of lower back and pelvis, initial encounter: Secondary | ICD-10-CM | POA: Diagnosis not present

## 2023-07-14 DIAGNOSIS — J449 Chronic obstructive pulmonary disease, unspecified: Secondary | ICD-10-CM

## 2023-07-14 MED ORDER — OXYCODONE HCL 10 MG PO TABS: 10.0000 mg | ORAL_TABLET | Freq: Three times a day (TID) | ORAL | 0 refills | Status: DC | PRN

## 2023-07-14 MED ORDER — DOXYCYCLINE HYCLATE 100 MG PO TABS: 100.0000 mg | ORAL_TABLET | Freq: Two times a day (BID) | ORAL | 0 refills | Status: DC

## 2023-07-14 NOTE — Progress Notes (Unsigned)
 ==============================  [facility]: [phone]   -- Medical Office Visit --  Patient: Michael Bender      Age: 78 y.o.       Sex:  male  Date:   07/14/2023 Today's Healthcare Provider: Anthon Kins, MD  ==============================   Chief Complaint: chronic bilateral low back pain with bilateral sciatica (Pt states his back is doing better .) Follow-up for multiple complex chronic conditions, chronic pain management, and new tick bite.  Discussed the use of AI scribe software for clinical note transcription with the patient, who gave verbal consent to proceed.  History of Present Illness History of Present Illness: The patient is a gentleman with an extensive and complex past medical history who presents for a scheduled follow-up. He is accompanied by his son. The patient continues to recover from a severe systemic infection last year, which stemmed from a through-and-through fishing hook injury and resulted in MSSA bacteremia with septic arthritis. There is a concern that this severe infectious process may have contributed to sequelae affecting his cardiac function and cognitive status, including reported memory changes. Currently, he is afebrile and denies acute symptoms of infection. His congestive heart failure remains a primary concern. He reports a degree of shortness of breath, which he describes as manageable. Clinical concern for fluid overload is noted, with signs of hepatic congestion and asymmetric leg edema. Chronic pain, primarily low back pain with bilateral sciatica, is an ongoing issue. The patient is managed with long-term opioid therapy (oxycodone ) and is currently between pain management specialists. A new pain management contract will be established today to continue therapy while a referral is coordinated. Significant cachexia and muscle wasting have been observed over the past year. The patient has been counseled on  improving nutritional intake, including the use of protein shakes and a gentle workout program to regain strength. A new tick bite on the lower back is reported. The patient denies seeing a bullseye rash. The patient was counseled on smoking cessation to benefit his overall health, particularly his COPD and cardiovascular status. He reports positive family interactions and a safe home environment, though fall precautions were reinforced. Review of Systems: Constitutional: Reports significant weakness and muscle loss. Denies fevers or chills. Cardiovascular: Denies chest pain. Reports swelling in one leg. Respiratory: Reports manageable shortness of breath. Denies cough. Neurological: Reports ongoing memory issues and balance problems. Denies acute headache. Musculoskeletal: Reports chronic low back pain with bilateral sciatica. Skin: Reports a new tick bite on the lower back. Denies rash or local signs of infection. Past Medical History: As documented in the chart and reviewed. Significant for: Acute on Chronic Diastolic CHF (HCC), COPD (HCC), CKD Stage 3 (HCC), Chronic Low Back Pain with Lumbar Fusion, Opioid Dependence (HCC), Cachexia (HCC), MSSA Bacteremia with Septic Arthritis (11/2022), History of CVA, PAD (HCC), Iron Deficiency Anemia. Past Surgical History: As documented in the chart and reviewed. Includes Cervical Fusion (1992), Lumbar Fusion (2013), and I&D of left extremity (11/2022). Social History: Reports quitting smoking 4 years ago but was counseled on cessation today. Denies current alcohol or illicit drug use. Family History: Positive for heart disease (mother), and pancreatic cancer (father, brother). Allergies: Nubain (Nalbuphine HCl).  Medication Reconciliation: Current Outpatient Medications on File Prior to Visit  Medication Sig   amitriptyline  (ELAVIL ) 50 MG tablet Take 1 tablet (50 mg total) by mouth at bedtime.   amLODipine  (NORVASC ) 5 MG tablet Take 1 tablet (5 mg total)  by mouth daily.  cefadroxil  (DURICEF) 500 MG capsule Take by mouth.   cyclobenzaprine  (FLEXERIL ) 10 MG tablet Take 1 tablet (10 mg total) by mouth 3 (three) times daily as needed.   empagliflozin  (JARDIANCE ) 10 MG TABS tablet Take 1 tablet (10 mg total) by mouth daily before breakfast.   FeFum-FePoly-FA-B Cmp-C-Biot (INTEGRA PLUS ) CAPS Take 1 capsule by mouth daily.   hydrALAZINE  (APRESOLINE ) 50 MG tablet Take 1 tablet (50 mg total) by mouth every 8 (eight) hours.   ipratropium-albuterol  (DUONEB) 0.5-2.5 (3) MG/3ML SOLN Take 3 mLs by nebulization every 4 (four) hours as needed.   isosorbide  mononitrate (IMDUR ) 60 MG 24 hr tablet Take 1 tablet (60 mg total) by mouth daily.   pantoprazole  (PROTONIX ) 40 MG tablet Take 1 tablet (40 mg total) by mouth daily.   polyethylene glycol (MIRALAX  / GLYCOLAX ) 17 g packet Take 17 g by mouth daily as needed for moderate constipation.   Respiratory Therapy Supplies (NEBULIZER/TUBING/MOUTHPIECE) KIT 1 each by Does not apply route every 4 (four) hours as needed.   Respiratory Therapy Supplies (NEBULIZER/TUBING/MOUTHPIECE) KIT 1 each by Does not apply route every 4 (four) hours as needed.   rosuvastatin  (CRESTOR ) 40 MG tablet TAKE 1 TABLET BY MOUTH DAILY. REPLACES ATORVASTATIN  (STOP ATORVASTATIN  IF STILL TAKING)   sacubitril-valsartan (ENTRESTO ) 49-51 MG Take 1 tablet by mouth 2 (two) times daily.   SUMAtriptan  (IMITREX ) 50 MG tablet TAKE 1 TABLET (50 MG TOTAL) BY MOUTH DAILY. MAY REPEAT IN 2 HOURS IF HEADACHE PERSISTS OR RECURS.   No current facility-administered medications on file prior to visit.   Medications Discontinued During This Encounter  Medication Reason   Oxycodone  HCl 10 MG TABS Reorder     Physical Exam:    07/14/2023    2:55 PM 06/29/2023   11:52 AM 06/29/2023   11:50 AM  Vitals with BMI  Height 5' 11  5' 11  Weight 150 lbs 3 oz  148 lbs 10 oz  BMI 20.96  20.73  Systolic 138 171 478  Diastolic 88 51 60  Pulse 69  76  Vital signs  reviewed.  Nursing notes reviewed. Weight trend reviewed.  Physical Examination: General: Well-groomed male, appears his stated age. No acute distress. Notable for cachectic appearance with significant diffuse muscle wasting. Neurological: Awake, alert, and oriented. Pleasant and cooperative. Speech is clear but with some poverty of thought. No focal deficits. Pulmonary: Lungs are clear to auscultation bilaterally. Normal work of breathing. Cardiovascular: Regular rate and rhythm. No murmurs. Asymmetric edema noted in the left leg, greater than the right. No calf tenderness, cords, or erythema to suggest acute DVT. Musculoskeletal: All extremities intact. Examination of the lower back reveals a small punctum consistent with a recent tick bite; no surrounding erythema migrans, induration, or signs of infection. Psychiatric: Appropriate mood and affect. Engaged in the visit. Recent Laboratory Findings (06/29/2023): CBC: Significant for chronic normocytic anemia (Hgb 10.2 g/dL, Hct 29.5%, MCV 62.1 fL). Eosinophilia noted (Absolute 0.7 K/uL). Platelets and WBC are within normal limits. CMP: Significant for Stage 3 Chronic Kidney Disease (Creatinine 1.77 mg/dL, eGFR 39 mL/min). Hyperglycemia (Glucose 155 mg/dL) and elevated BUN (27 mg/dL) are noted. Albumin is 4.0 g/dL. Iron Panel: Iron 71 ug/dL, Ferritin 308 ng/mL, Saturation 27%. Consistent with anemia of chronic disease with adequate iron stores. BNP (05/19/2023): Elevated at 669.2 pg/mL.        04/19/2023   10:47 AM 03/22/2023    9:40 AM 02/17/2023   11:42 AM 01/17/2023   10:13 AM  PHQ 2/9 Scores  PHQ - 2 Score 0 0 2 0  PHQ- 9 Score  0 3    Results     Assessment & Plan Neck pain Chronic Pain Syndrome / Chronic Low  and Neck Back Pain with Sciatica / Opioid Dependence [facility]): Patient requires ongoing opioid therapy for functional pain control. Plan:  Prescribed a 30-day supply of Oxycodone  HCl 10 mg. Patient and provider signed a pain  management contract. Ordered a Urine Drug Screen (5-panel) today as per standard protocol. Will place a referral to a Pain Management specialist for comprehensive, long-term care. Chronic bilateral low back pain with bilateral sciatica Chronic Pain Syndrome / Chronic Low  and Neck Back Pain with Sciatica / Opioid Dependence [facility]): Patient requires ongoing opioid therapy for functional pain control. Plan:  Prescribed a 30-day supply of Oxycodone  HCl 10 mg. Patient and provider signed a pain management contract. Ordered a Urine Drug Screen (5-panel) today as per standard protocol. Will place a referral to a Pain Management specialist for comprehensive, long-term care. High risk medication use PDMP reviewed during this encounter.  Tick bite of lower back, initial encounter Tick Bite, Lower Back (New Problem): No signs of local infection or erythema migrans, but prophylactic treatment is prudent given the patient's comorbidities. Plan: Prescribed Doxycycline 100 mg PO BID for 10 days for Lyme disease prophylaxis. Counseled patient on symptoms of Lyme disease (fever, rash, joint pain) and to return if they develop. Congestive heart failure with left ventricular diastolic dysfunction, acute on chronic (HCC) Acute on Chronic Diastolic Congestive Heart Failure (HCC): Stable but with evidence of volume overload on exam. Plan: Continue sacubitril-valsartan (Entresto ) and empagliflozin  (Jardiance ). Reinforce importance of daily weight monitoring, adherence to fluid and sodium restrictions. Stage 3a chronic kidney disease (HCC) Chronic Kidney Disease (CKD), Stage 3 (HCC): Stable, with eGFR 39 mL/min. Plan: Continue to monitor renal function via CMP. Counsel patient to avoid all NSAIDs. All medication dosages will continue to be reviewed for renal safety. Cachexia (HCC) Cachexia / Malnutrition (HCC): Significant muscle wasting is impacting strength and functional status. Plan: Counseled patient and son on  increasing caloric and protein intake. Recommended protein shakes as a supplement. Encouraged engagement in a light strength training program as tolerated. Anemia, unspecified type Normocytic Anemia of Chronic Disease: Hgb stable at 10.2 g/dL. Plan: Continue iron/vitamin supplement (Integra Plus ). Will recheck CBC at next follow-up. Chronic obstructive pulmonary disease, unspecified COPD type (HCC) COPD / Emphysema (HCC): Clinically stable. Plan: Continue ipratropium-albuterol  nebulizer as needed for symptoms. Strongly counseled on the benefits of smoking cessation. Memory loss Cognitive Impairment / Memory Loss: Stable. Likely multifactorial. Plan: Continue to monitor. Reinforced home safety and fall precautions with the patient and his son.  Has excellent family support and safe in current living situation.  Follow-up: Return to the clinic in 3 months for ongoing management, or sooner if new concerns arise.  Orders Placed: Prescriptions:  Oxycodone  HCl 10 mg tablet, #90, 0 refills Doxycycline 100 mg tablet, #20, 0 refills Labs:  Drug Screen, 5 Panel, Urine Referrals:  Referral to Pain Management Clinic to be initiated.   This document was synthesized by artificial intelligence ([employer]bridge) using HIPAA-compliant recording of the clinical interaction;   We discussed the use of AI scribe software for clinical note transcription with the patient, who gave verbal consent to proceed. additional Info: This encounter employed state-of-the-art, real-time, collaborative documentation. The patient actively reviewed and assisted in updating their electronic medical record on a shared screen, ensuring transparency and facilitating joint problem-solving for the  problem list, overview, and plan. This approach promotes accurate, informed care. The treatment plan was discussed and reviewed in detail, including medication safety, potential side effects, and all patient questions. We confirmed understanding and  comfort with the plan. Follow-up instructions were established, including contacting the office for any concerns, returning if symptoms worsen, persist, or new symptoms develop, and precautions for potential emergency department visits.

## 2023-07-16 NOTE — Assessment & Plan Note (Signed)
 Chronic Kidney Disease (CKD), Stage 3 (HCC): Stable, with eGFR 39 mL/min. Plan: Continue to monitor renal function via CMP. Counsel patient to avoid all NSAIDs. All medication dosages will continue to be reviewed for renal safety.

## 2023-07-16 NOTE — Assessment & Plan Note (Signed)
>>  ASSESSMENT AND PLAN FOR CHRONIC LOW BACK PAIN WRITTEN ON 07/16/2023  9:57 AM BY Iniko Robles G, MD  Chronic Pain Syndrome / Chronic Low  and Neck Back Pain with Sciatica / Opioid Dependence [facility]): Patient requires ongoing opioid therapy for functional pain control. Plan:  Prescribed a 30-day supply of Oxycodone  HCl 10 mg. Patient and provider signed a pain management contract. Ordered a Urine Drug Screen (5-panel) today as per standard protocol. Will place a referral to a Pain Management specialist for comprehensive, long-term care.

## 2023-07-16 NOTE — Assessment & Plan Note (Signed)
 Cognitive Impairment / Memory Loss: Stable. Likely multifactorial. Plan: Continue to monitor. Reinforced home safety and fall precautions with the patient and his son.  Has excellent family support and safe in current living situation.

## 2023-07-16 NOTE — Assessment & Plan Note (Signed)
 Cachexia / Malnutrition (HCC): Significant muscle wasting is impacting strength and functional status. Plan: Counseled patient and son on increasing caloric and protein intake. Recommended protein shakes as a supplement. Encouraged engagement in a light strength training program as tolerated.

## 2023-07-16 NOTE — Assessment & Plan Note (Addendum)
PDMP reviewed during this encounter.  

## 2023-07-16 NOTE — Assessment & Plan Note (Signed)
 Chronic Pain Syndrome / Chronic Low  and Neck Back Pain with Sciatica / Opioid Dependence [facility]): Patient requires ongoing opioid therapy for functional pain control. Plan:  Prescribed a 30-day supply of Oxycodone  HCl 10 mg. Patient and provider signed a pain management contract. Ordered a Urine Drug Screen (5-panel) today as per standard protocol. Will place a referral to a Pain Management specialist for comprehensive, long-term care.

## 2023-07-16 NOTE — Assessment & Plan Note (Signed)
 Normocytic Anemia of Chronic Disease: Hgb stable at 10.2 g/dL. Plan: Continue iron/vitamin supplement (Integra Plus ). Will recheck CBC at next follow-up.

## 2023-07-16 NOTE — Assessment & Plan Note (Signed)
 Acute on Chronic Diastolic Congestive Heart Failure (HCC): Stable but with evidence of volume overload on exam. Plan: Continue sacubitril-valsartan (Entresto ) and empagliflozin  (Jardiance ). Reinforce importance of daily weight monitoring, adherence to fluid and sodium restrictions.

## 2023-07-16 NOTE — Assessment & Plan Note (Signed)
 COPD / Emphysema (HCC): Clinically stable. Plan: Continue ipratropium-albuterol  nebulizer as needed for symptoms. Strongly counseled on the benefits of smoking cessation.

## 2023-07-16 NOTE — Patient Instructions (Addendum)
 AFTER VISIT SUMMARY FOR YOUR FATHER   Overview: Your father's recent visit to Dr. Boston Byers focused on managing his heart condition, chronic pain, and addressing a new tick bite. Heart Health Your father's heart condition is stable, but signs of fluid retention are present. Ensure he takes his heart medications daily. Encourage a low-salt diet and daily weight tracking. Chronic Pain His back pain is managed with Oxycodone , and a new pain management agreement has been signed. A referral to a Pain Management clinic has been made. Expect a call to schedule his appointment. Tick Bite We have prescribed Doxycycline to prevent Lyme disease. Ensure he completes the full 10-day course. Nutrition and Strength Significant weight loss and muscle weakness are concerning. Encourage protein shakes and nutritious meals. Kidney Health His kidney function is stable. He must avoid all NSAID pain relievers. Your Action Plan Medication Pick up Doxycycline and Oxycodone  from the pharmacy.  Follow-up Expect a call from the Pain Management clinic.  Monitoring Monitor for fall risks and ensure medication adherence.  Important Warning Signs SEEK IMMEDIATE MEDICAL ATTENTION if you experience: Worsening shortness of breath. Sudden weight gain. A new rash, fever, or severe headache.        Health is a journey we take together, M ?? Dr. Melven Stable. Dedicated to your health journey Sending you warm wishes for continued health  Your trusted partner in wellness     NEXT STEPS: [x]  Early Intervention: Schedule sooner appointment, call our on-call services, or go to emergency room if there is any significant Increase in pain or discomfort New or worsening symptoms Sudden or severe changes in your health [x]  Flexible Follow-Up: We recommend a No follow-ups on file. for optimal routine care. This allows for progress monitoring and treatment adjustments. [x]  Preventive Care: Schedule your annual preventive care  visit! It's typically covered by insurance and helps identify potential health issues early. [x]  Lab & X-ray Appointments: Incomplete tests scheduled today, or call to schedule. X-rays:  [facility] (M-F, 8:30am-noon or 1pm-5pm). [x]  Medical Information Release: Sign a release form at front desk to obtain relevant medical information we don't have.  MAKING THE MOST OF OUR FOCUSED 20 MINUTE APPOINTMENTS: [x]   Clearly state your top concerns at the beginning of the visit to focus our discussion [x]   If you anticipate you will need more time, please inform the front desk during scheduling - we can book multiple appointments in the same week. [x]   If you have transportation problems- use our convenient video appointments or ask about transportation support. [x]   We can get down to business faster if you use MyChart to update information before the visit and submit non-urgent questions before your visit. Thank you for taking the time to provide details through MyChart.  Let our nurse know and she can import this information into your encounter documents.  Arrival and Wait Times: [x]   Arriving on time ensures that everyone receives prompt attention. [x]   Early morning (8a) and afternoon (1p) appointments tend to have shortest wait times. [x]   Unfortunately, we cannot delay appointments for late arrivals or hold slots during phone calls.  Getting Answers and Following Up [x]   Simple Questions & Concerns: For quick questions or basic follow-up after your visit, reach us  at [phone] or MyChart messaging. [x]   Complex Concerns: If your concern is more complex, scheduling an appointment might be best. Discuss this with the staff to find the most suitable option. [x]   Lab & Imaging Results: We'll contact you directly  if results are abnormal or you don't use MyChart. Most normal results will be on MyChart within 2-3 business days, with a review message from Dr. Boston Byers. Haven't heard back  in 2 weeks? Need results sooner? Contact us  at [phone]. [x]   Referrals: Our referral coordinator will manage specialist referrals. The specialist's office should contact you within 2 weeks to schedule an appointment. Call us  if you haven't heard from them after 2 weeks.  Staying Connected [x]   MyChart: Activate your MyChart for the fastest way to access results and message us . See the last page of this paperwork for instructions on how to activate.  Bring to Your Next Appointment [x]   Medications: Please bring all your medication bottles to your next appointment to ensure we have an accurate record of your prescriptions. [x]   Health Diaries: If you're monitoring any health conditions at home, keeping a diary of your readings can be very helpful for discussions at your next appointment.  Billing [x]   X-ray & Lab Orders: These are billed by separate companies. Contact the invoicing company directly for questions or concerns. [x]   Visit Charges: Discuss any billing inquiries with our administrative services team.  Your Satisfaction Matters [x]   Share Your Experience: We strive for your satisfaction! If you have any complaints, or preferably compliments, please let Dr. Boston Byers know directly or contact our Practice Administrators, Olinda Bertrand or [employer], by asking at the front desk.   Reviewing Your Records [x]   Review this early draft of your clinical encounter notes below and the final encounter summary tomorrow on MyChart after its been completed.  All orders placed so far are visible here: Neck pain -     oxyCODONE  HCl; Take 1 tablet (10 mg total) by mouth every 8 (eight) hours as needed.  Dispense: 90 tablet; Refill: 0  Chronic bilateral low back pain with bilateral sciatica -     oxyCODONE  HCl; Take 1 tablet (10 mg total) by mouth every 8 (eight) hours as needed.  Dispense: 90 tablet; Refill: 0  High risk medication use -     oxyCODONE  HCl; Take 1 tablet (10 mg total) by  mouth every 8 (eight) hours as needed.  Dispense: 90 tablet; Refill: 0 -     Drug Screen, 5 Panel, Ur  Tick bite of lower back, initial encounter -     Doxycycline Hyclate; Take 1 tablet (100 mg total) by mouth 2 (two) times daily.  Dispense: 20 tablet; Refill: 0  Congestive heart failure with left ventricular diastolic dysfunction, acute on chronic (HCC)  Stage 3a chronic kidney disease (HCC)  Cachexia (HCC)  Anemia, unspecified type  Chronic obstructive pulmonary disease, unspecified COPD type (HCC)  Memory loss

## 2023-07-20 ENCOUNTER — Ambulatory Visit: Attending: Emergency Medicine | Admitting: Emergency Medicine

## 2023-07-20 ENCOUNTER — Encounter: Payer: Self-pay | Admitting: Emergency Medicine

## 2023-07-20 VITALS — BP 138/50 | HR 79 | Ht 71.0 in | Wt 149.0 lb

## 2023-07-20 DIAGNOSIS — Z79899 Other long term (current) drug therapy: Secondary | ICD-10-CM

## 2023-07-20 DIAGNOSIS — D509 Iron deficiency anemia, unspecified: Secondary | ICD-10-CM

## 2023-07-20 DIAGNOSIS — I10 Essential (primary) hypertension: Secondary | ICD-10-CM

## 2023-07-20 DIAGNOSIS — I5022 Chronic systolic (congestive) heart failure: Secondary | ICD-10-CM

## 2023-07-20 DIAGNOSIS — N183 Chronic kidney disease, stage 3 unspecified: Secondary | ICD-10-CM | POA: Diagnosis not present

## 2023-07-20 DIAGNOSIS — I351 Nonrheumatic aortic (valve) insufficiency: Secondary | ICD-10-CM | POA: Diagnosis not present

## 2023-07-20 DIAGNOSIS — I480 Paroxysmal atrial fibrillation: Secondary | ICD-10-CM | POA: Diagnosis not present

## 2023-07-20 DIAGNOSIS — E785 Hyperlipidemia, unspecified: Secondary | ICD-10-CM | POA: Diagnosis not present

## 2023-07-20 MED ORDER — BISOPROLOL FUMARATE 5 MG PO TABS: 5.0000 mg | ORAL_TABLET | Freq: Every day | ORAL | 1 refills | Status: DC

## 2023-07-20 MED ORDER — METOPROLOL SUCCINATE ER 25 MG PO TB24: 25.0000 mg | ORAL_TABLET | Freq: Every day | ORAL | 1 refills | Status: DC

## 2023-07-20 NOTE — Progress Notes (Signed)
 Cardiology Office Note:    Date:  07/20/2023  ID:  Michael Bender, DOB [birth year], MRN [id number] PCP: Michael Kins, MD  [facility] Providers Cardiologist:  Michael Berger, MD       Patient Profile:       Chief Complaint: 6-week follow-up History of Present Illness:  Michael Bender is a 78 y.o. male with visit-pertinent history of hypertension, hyperlipidemia, diastolic dysfunction, orthostatic hypotension with recurrent syncope, paroxysmal atrial fibrillation, rhabdomyolysis, BPH, CVA, PAD, emphysema, GERD, DJD, HFmrEF   Echocardiogram 11/2022 showed LVEF 60 to 65%.   He had recent admission on 11/2022 for falls, weakness, and rhabdomyolysis and ultimately diagnosed with MSSA bacteremia.  Of note he did have new onset A-fib with RVR during this admission and was started on IV diltiazem  and spontaneously converted to NSR.  It was noted he was not a candidate for anticoagulation due to his recurrent falls.  He was discharged to skilled nursing facility but then brought back with sepsis due to MSSA bacteremia on 02/2023.  Current 6 weeks of IV antibiotics and was followed by infectious disease.   Recently admitted on 04/2023 with acute on chronic heart failure.  Echocardiogram showed LVEF 40 to 45%, grade 1 diastolic dysfunction, normal RV.  He was diuresed with IV Lasix .  Had AKI so cardiac cath was deferred.  GDMT was titrated and he was discharged home at weight 136 LBS.   He followed up with advanced heart failure clinic on 05/01/2023.  It is unclear the etiology for the reduction in his EF.  Could be related to recent MSSA sepsis versus uncontrolled hypertension.  He was NYHA class II and volume status okay during clinic visit.  He was started on losartan  25 mg daily.  He is continued on hydralazine  50 mg 3 times daily and Imdur  30 mg daily.  SGLT2i was avoided due to concern for hygiene and volume depletion.  Cardiac MRI was ordered to evaluate for infiltrative disease and is  currently pending.   Was seen for follow-up with advanced heart failure clinic on 05/19/2023.  He continued with NYHA class II symptoms.  His volume status was stable.  Medication management was continued.   He was last seen in clinic on 06/07/2023.  He is doing well at the time.  His blood pressure was elevated in office at 164/74.  His losartan  was discontinued and he was started on Entresto  49 to 51 mg twice daily and spironolactone  12.5 mg daily.  Follow-up lab work on 05/19/2023 showed worsening creatinine of 1.6.  Spironolactone  was discontinued at that time.  Cardiac MRI 06/06/2023 showed LVEF 42%, moderate to severe aortic regurgitation consistent with aortic regurgitation associated with left ventricular dysfunction, evidence of mild ascending dilation of aorta measuring 43 mm.   Discussed the use of AI scribe software for clinical note transcription with the patient, who gave verbal consent to proceed.  History of Present Illness Michael Bender is a 78 year old male who presents today for follow-up.  Today he presents to office with his son.  He tells me he is doing well overall.  He is without any acute cardiovascular concerns or complaint at this time.  He has no chest pain, shortness of breath, dizziness, lightheadedness, orthopnea, PND, or dyspnea. Blood pressure is stable with medication adherence.   He maintains regular physical activity without limitation.  Weight is stable with no peripheral edema.  Current medications include Entresto , amlodipine , Jardiance , and hydralazine . He is not on spironolactone   due to renal function concerns. His son assists with medication management. Renal function is variable with fluctuating creatinine levels.  Review of systems:  Please see the history of present illness. All other systems are reviewed and otherwise negative.      Studies Reviewed:        Cardiac MRI 06/06/2023 IMPRESSION: 1. Mild decrease left ventricular systolic function  (LVEF =42%).   2. Moderate to severe regurgitation with regurgitant fraction 35% with moderate left ventricular dilation. This is markedly different that color Doppler evaluation on recent echocardiogram. Consider TEE assessment as this MRI is consistent with aortic regurgitation associated left ventricular dysfunction.   3. Evidence of mild ascending aortic dilation, 43 mm.  Echocardiogram 04/21/2023  1. Left ventricular ejection fraction, by estimation, is 40 to 45%. The  left ventricle has mildly decreased function. The left ventricle  demonstrates global hypokinesis. There is mild left ventricular  hypertrophy. Left ventricular diastolic parameters  are consistent with Grade I diastolic dysfunction (impaired relaxation).   2. Right ventricular systolic function is normal. The right ventricular  size is normal. There is mildly elevated pulmonary artery systolic  pressure. The estimated right ventricular systolic pressure is 38.0 mmHg.   3. Moderate pleural effusion in both left and right lateral regions.   4. The mitral valve is abnormal. Trivial mitral valve regurgitation.   5. The aortic valve is tricuspid. Aortic valve regurgitation is trivial.  Aortic valve sclerosis/calcification is present, without any evidence of  aortic stenosis.   6. The inferior vena cava is normal in size with greater than 50%  respiratory variability, suggesting right atrial pressure of 3 mmHg.   Risk Assessment/Calculations:              Physical Exam:   VS:  BP (!) 138/50 (BP Location: Left Arm, Patient Position: Sitting, Cuff Size: Normal)   Pulse 79   Ht 5' 11 (1.803 m)   Wt 149 lb (67.6 kg)   BMI 20.78 kg/m    Wt Readings from Last 3 Encounters:  07/20/23 149 lb (67.6 kg)  07/14/23 150 lb 3.2 oz (68.1 kg)  06/29/23 148 lb 9.6 oz (67.4 kg)    GEN: Well nourished, well developed in no acute distress NECK: No JVD; No carotid bruits CARDIAC: RRR, no murmurs, rubs, gallops RESPIRATORY:   Clear to auscultation without rales, wheezing or rhonchi  ABDOMEN: Soft, non-tender, non-distended EXTREMITIES:  No edema; No acute deformity      Assessment and Plan:  HFmrEF Echocardiogram 11/2022 with LVEF 60 to 65% Echocardiogram 04/2023 with EF 40-45% Cardiac MRI 06/2023 with LVEF 42% and RVEF 48% with no LGE or evidence of infiltrative disease Cardiac MRI was consistent with aortic regurgitation associated with left ventricular dysfunction.  Spoke with DOD Dr. Renna Cary who reviewed patient's MRI prior echocardiogram on 04/2023 which read trivial AR.  Dr. Renna Cary did suggest TEE for further evaluation There is unclear etiology for reduction in his LVEF.  Could also be related to septic cardiomyopathy versus uncontrolled HTN.  As noted cardiac cath was deferred due to CKD Per AHF clinic patient is not a candidate for advanced therapies with significant COPD and CKD - Today patient is euvolemic and well compensated on exam.  NYHA class II (chronic stable dyspnea h/o COPD).  Denies SOB at rest, orthopnea, PND, chest pains - Unable to start spironolactone  given CKD - Plan to start bisoprolol 5 mg daily today to further optimize GDMT - GDMT currently: Bisoprolol 5 mg daily, Entresto  49-51 mg  twice daily, Jardiance  10 mg daily - Continue hydralazine  50 mg 3 times daily and Imdur  60 mg daily - BMET today and again in 2 weeks  Aortic regurgitation Echocardiogram 04/2023 showed trivial aortic valve regurgitation Cardiac MRI 06/2023 showed moderate to severe regurgitation with regurgitant fraction of 35% with moderate left ventricular dilation likely consistent with aortic regurgitation associated left ventricular dysfunction - Today he is overall asymptomatic.  He denies any syncope, presyncope, dizziness, lightheadedness, chronic DOE (COPD) remained stable - As noted above reviewed MRI and prior echo with Dr. Renna Cary who recommended TEE for further evaluation   CKD stage III Creatinine 1.77 and GFR 39  on 06/29/2023 Avoid NSAIDs and stay adequately hydrated - Repeat BMET today and again in 2 weeks   Hypertension Blood pressure today is better controlled at 138/50 Blood pressure has previously been uncontrolled - Continue HF GDMT of bisoprolol 5 mg daily, Entresto  49-51 mg twice daily - Continue amlodipine  5 mg daily, hydralazine  50 mg every 8 hours, Imdur  60 mg daily   Paroxysmal atrial fibrillation Episode noted during admission with sepsis/MSSA bacteremia on 11/2022 and spontaneously converted to sinus rhythm.  Was noted not to be a candidate for anticoagulation due to recurrent falls at the time as he was also noted to have history of GI bleed due to duodenal ulcer in 2021 - Patient denies any symptoms concerning for recurrent atrial fibrillation   Iron deficiency anemia Hemoglobin 10.2 on 06/2023 - Continue iron/vitamin supplement - Management per PCP   Informed Consent   Shared Decision Making/Informed Consent   The risks [esophageal damage, perforation (1:10,000 risk), bleeding, pharyngeal hematoma as well as other potential complications associated with conscious sedation including aspiration, arrhythmia, respiratory failure and death], benefits (treatment guidance and diagnostic support) and alternatives of a transesophageal echocardiogram were discussed in detail with Mr. Tetterton and he is willing to proceed.      Dispo:  Return in about 3 months (around 10/20/2023).  Signed, Ava Boatman, NP

## 2023-07-20 NOTE — Patient Instructions (Signed)
 Medication Instructions:  START TAKING METOPROLOL  SUCCINATE 25 MG DAILY. CONTINUE WITH ALL OTHER CURRENT MEDICATION THERAPY.  Lab Work: BMET TO BE DONE TODAY.   Testing/Procedures:    Dear Michael Bender  You are scheduled for a TEE (Transesophageal Echocardiogram) on Monday, June 23 with Dr. Audery Blazing.  Please arrive at the [facility] (Main Entrance A) at [facility]: [address] [place], [place] [address] at 9:00 AM (This time is 1 hour(s) before your procedure to ensure your preparation).   Free valet parking service is available. You will check in at ADMITTING.   *Please Note: You will receive a call the day before your procedure to confirm the appointment time. That time may have changed from the original time based on the schedule for that day.*   DIET:  Nothing to eat or drink after midnight except a sip of water with medications (see medication instructions below)  MEDICATION INSTRUCTIONS: !!IF ANY NEW MEDICATIONS ARE STARTED AFTER TODAY, PLEASE NOTIFY YOUR PROVIDER AS SOON AS POSSIBLE!!  FYI: Medications such as Semaglutide (Ozempic, [place]), Tirzepatide (Mounjaro, Zepbound), Dulaglutide (Trulicity), etc (GLP1 agonists) AND Canagliflozin (Invokana), Dapagliflozin (Farxiga), Empagliflozin  (Jardiance ), Ertugliflozin (Steglatro), Bexagliflozin Arboriculturist) or any combination with one of these drugs such as Invokamet (Canagliflozin/Metformin ), Synjardy (Empagliflozin /Metformin ), etc (SGLT2 inhibitors) must be held around the time of a procedure. This is not a comprehensive list of all of these drugs. Please review all of your medications and talk to your provider if you take any one of these. If you are not sure, ask your provider.   HOLD: Empagliflozin  (Jardiance ) for 3 days prior to the procedure. Last dose on Thursday, July 20, 2023.    LABS:   Come to the lab at the [facility] ([address] [place], 1st Floor)  between the hours of 8:00 am and 4:30 pm. You do NOT have to be fasting.  FYI:  For your safety, and to allow us  to monitor your vital signs accurately during the surgery/procedure we request: If you have artificial nails, gel coating, SNS etc, please have those removed prior to your surgery/procedure. Not having the nail coverings /polish removed may result in cancellation or delay of your surgery/procedure.  Your support person will be asked to wait in the waiting room during your procedure.  It is OK to have someone drop you off and come back when you are ready to be discharged.  You cannot drive after the procedure and will need someone to drive you home.  Bring your insurance cards.  *Special Note: Every effort is made to have your procedure done on time. Occasionally there are emergencies that occur at the hospital that may cause delays. Please be patient if a delay does occur.      Follow-Up: At [facility], you and your health needs are our priority.  As part of our continuing mission to provide you with exceptional heart care, our providers are all part of one team.  This team includes your primary Cardiologist (physician) and Advanced Practice Providers or APPs (Physician Assistants and Nurse Practitioners) who all work together to provide you with the care you need, when you need it.  Your next appointment:   3 MONTHS  Provider:   MADISON FOUNTAIN, DNP

## 2023-07-21 ENCOUNTER — Other Ambulatory Visit (INDEPENDENT_AMBULATORY_CARE_PROVIDER_SITE_OTHER)

## 2023-07-21 ENCOUNTER — Other Ambulatory Visit: Payer: Self-pay

## 2023-07-21 DIAGNOSIS — I5033 Acute on chronic diastolic (congestive) heart failure: Secondary | ICD-10-CM

## 2023-07-21 DIAGNOSIS — Z79899 Other long term (current) drug therapy: Secondary | ICD-10-CM | POA: Diagnosis not present

## 2023-07-21 LAB — CBC WITH DIFFERENTIAL/PLATELET
Basophils Absolute: 0 10*3/uL (ref 0.0–0.1)
Basophils Relative: 0.4 % (ref 0.0–3.0)
Eosinophils Absolute: 0.4 10*3/uL (ref 0.0–0.7)
Eosinophils Relative: 9 % — ABNORMAL HIGH (ref 0.0–5.0)
HCT: 28.8 % — ABNORMAL LOW (ref 39.0–52.0)
Hemoglobin: 9.7 g/dL — ABNORMAL LOW (ref 13.0–17.0)
Lymphocytes Relative: 23.3 % (ref 12.0–46.0)
Lymphs Abs: 1.1 10*3/uL (ref 0.7–4.0)
MCHC: 33.7 g/dL (ref 30.0–36.0)
MCV: 88.8 fl (ref 78.0–100.0)
Monocytes Absolute: 0.4 10*3/uL (ref 0.1–1.0)
Monocytes Relative: 8.4 % (ref 3.0–12.0)
Neutro Abs: 2.7 10*3/uL (ref 1.4–7.7)
Neutrophils Relative %: 58.9 % (ref 43.0–77.0)
Platelets: 129 10*3/uL — ABNORMAL LOW (ref 150.0–400.0)
RBC: 3.25 Mil/uL — ABNORMAL LOW (ref 4.22–5.81)
RDW: 14.6 % (ref 11.5–15.5)
WBC: 4.6 10*3/uL (ref 4.0–10.5)

## 2023-07-21 LAB — BASIC METABOLIC PANEL WITH GFR
BUN/Creatinine Ratio: 16 (ref 10–24)
BUN: 30 mg/dL — ABNORMAL HIGH (ref 8–27)
CO2: 21 mmol/L (ref 20–29)
Calcium: 8.4 mg/dL — ABNORMAL LOW (ref 8.6–10.2)
Chloride: 100 mmol/L (ref 96–106)
Creatinine, Ser: 1.85 mg/dL — ABNORMAL HIGH (ref 0.76–1.27)
Glucose: 119 mg/dL — ABNORMAL HIGH (ref 70–99)
Potassium: 5.2 mmol/L (ref 3.5–5.2)
Sodium: 135 mmol/L (ref 134–144)
eGFR: 37 mL/min/{1.73_m2} — ABNORMAL LOW (ref >59)

## 2023-07-21 NOTE — Progress Notes (Addendum)
 Spoke to patient's son, [name] and instructed them to come at [facility]  and to be NPO after 0000.  Medications reviewed.    Confirmed that patient will have a ride home and someone to stay with them for 24 hours after the procedure.

## 2023-07-22 ENCOUNTER — Ambulatory Visit: Payer: Self-pay | Admitting: Internal Medicine

## 2023-07-22 LAB — DRUG SCREEN, 5 PANEL, UR
Amphetamines, Urine: NEGATIVE ng/mL
Cannabinoid Quant, Ur: NEGATIVE ng/mL
Cocaine (Metab.): NEGATIVE ng/mL
OPIATE QUANTITATIVE URINE: NEGATIVE ng/mL
PCP Quant, Ur: NEGATIVE ng/mL

## 2023-07-22 NOTE — Progress Notes (Signed)
 Labs showing stable anemia, but a new platelet(s) drop.  Suspect due to congestion in liver from recent heart issues. We should repeat in 1 month on CBC. Order/arrange

## 2023-07-24 ENCOUNTER — Ambulatory Visit (HOSPITAL_COMMUNITY): Admitting: Certified Registered Nurse Anesthetist

## 2023-07-24 ENCOUNTER — Other Ambulatory Visit: Payer: Self-pay

## 2023-07-24 ENCOUNTER — Ambulatory Visit (HOSPITAL_COMMUNITY)
Admission: RE | Admit: 2023-07-24 | Discharge: 2023-07-24 | Disposition: A | Discharge status: Home / Self Care | Attending: Cardiology | Admitting: Cardiology

## 2023-07-24 ENCOUNTER — Encounter (HOSPITAL_COMMUNITY): Payer: Self-pay | Admitting: Cardiology

## 2023-07-24 ENCOUNTER — Ambulatory Visit: Payer: Self-pay | Admitting: Cardiology

## 2023-07-24 ENCOUNTER — Ambulatory Visit (HOSPITAL_COMMUNITY)
Admission: RE | Admit: 2023-07-24 | Discharge: 2023-07-24 | Disposition: A | Discharge status: Home / Self Care | Source: Ambulatory Visit | Attending: Emergency Medicine | Admitting: Emergency Medicine

## 2023-07-24 ENCOUNTER — Encounter (HOSPITAL_COMMUNITY)
Admission: RE | Disposition: A | Discharge status: Home / Self Care | Payer: Self-pay | Source: Home / Self Care | Attending: Cardiology

## 2023-07-24 DIAGNOSIS — I509 Heart failure, unspecified: Secondary | ICD-10-CM | POA: Diagnosis not present

## 2023-07-24 DIAGNOSIS — D509 Iron deficiency anemia, unspecified: Secondary | ICD-10-CM | POA: Diagnosis not present

## 2023-07-24 DIAGNOSIS — N183 Chronic kidney disease, stage 3 unspecified: Secondary | ICD-10-CM | POA: Diagnosis not present

## 2023-07-24 DIAGNOSIS — I5022 Chronic systolic (congestive) heart failure: Secondary | ICD-10-CM | POA: Insufficient documentation

## 2023-07-24 DIAGNOSIS — N400 Enlarged prostate without lower urinary tract symptoms: Secondary | ICD-10-CM | POA: Insufficient documentation

## 2023-07-24 DIAGNOSIS — Z8673 Personal history of transient ischemic attack (TIA), and cerebral infarction without residual deficits: Secondary | ICD-10-CM | POA: Insufficient documentation

## 2023-07-24 DIAGNOSIS — I080 Rheumatic disorders of both mitral and aortic valves: Secondary | ICD-10-CM | POA: Insufficient documentation

## 2023-07-24 DIAGNOSIS — I130 Hypertensive heart and chronic kidney disease with heart failure and stage 1 through stage 4 chronic kidney disease, or unspecified chronic kidney disease: Secondary | ICD-10-CM

## 2023-07-24 DIAGNOSIS — D649 Anemia, unspecified: Secondary | ICD-10-CM

## 2023-07-24 DIAGNOSIS — I351 Nonrheumatic aortic (valve) insufficiency: Secondary | ICD-10-CM

## 2023-07-24 DIAGNOSIS — I700 Atherosclerosis of aorta: Secondary | ICD-10-CM | POA: Insufficient documentation

## 2023-07-24 DIAGNOSIS — Z79899 Other long term (current) drug therapy: Secondary | ICD-10-CM | POA: Insufficient documentation

## 2023-07-24 DIAGNOSIS — E785 Hyperlipidemia, unspecified: Secondary | ICD-10-CM | POA: Insufficient documentation

## 2023-07-24 DIAGNOSIS — J439 Emphysema, unspecified: Secondary | ICD-10-CM | POA: Insufficient documentation

## 2023-07-24 DIAGNOSIS — I340 Nonrheumatic mitral (valve) insufficiency: Secondary | ICD-10-CM

## 2023-07-24 DIAGNOSIS — I739 Peripheral vascular disease, unspecified: Secondary | ICD-10-CM | POA: Diagnosis not present

## 2023-07-24 DIAGNOSIS — J449 Chronic obstructive pulmonary disease, unspecified: Secondary | ICD-10-CM

## 2023-07-24 DIAGNOSIS — I330 Acute and subacute infective endocarditis: Secondary | ICD-10-CM

## 2023-07-24 DIAGNOSIS — Z87891 Personal history of nicotine dependence: Secondary | ICD-10-CM | POA: Diagnosis not present

## 2023-07-24 DIAGNOSIS — I129 Hypertensive chronic kidney disease with stage 1 through stage 4 chronic kidney disease, or unspecified chronic kidney disease: Secondary | ICD-10-CM | POA: Diagnosis not present

## 2023-07-24 DIAGNOSIS — N1831 Chronic kidney disease, stage 3a: Secondary | ICD-10-CM

## 2023-07-24 DIAGNOSIS — K219 Gastro-esophageal reflux disease without esophagitis: Secondary | ICD-10-CM | POA: Insufficient documentation

## 2023-07-24 DIAGNOSIS — I480 Paroxysmal atrial fibrillation: Secondary | ICD-10-CM | POA: Diagnosis not present

## 2023-07-24 HISTORY — PX: TRANSESOPHAGEAL ECHOCARDIOGRAM (CATH LAB): EP1270

## 2023-07-24 LAB — ECHO TEE

## 2023-07-24 SURGERY — TRANSESOPHAGEAL ECHOCARDIOGRAM (TEE) (CATHLAB)
Anesthesia: Monitor Anesthesia Care

## 2023-07-24 MED ORDER — GLYCOPYRROLATE 0.2 MG/ML IJ SOLN
INTRAMUSCULAR | Status: DC | PRN
  Administered 2023-07-24: .2 mg via INTRAVENOUS

## 2023-07-24 MED ORDER — PHENYLEPHRINE 80 MCG/ML (10ML) SYRINGE FOR IV PUSH (FOR BLOOD PRESSURE SUPPORT)
PREFILLED_SYRINGE | INTRAVENOUS | Status: DC | PRN
  Administered 2023-07-24 (×2): 160 ug via INTRAVENOUS
  Administered 2023-07-24: 80 ug via INTRAVENOUS

## 2023-07-24 MED ORDER — EPHEDRINE SULFATE-NACL 50-0.9 MG/10ML-% IV SOSY
PREFILLED_SYRINGE | INTRAVENOUS | Status: DC | PRN
  Administered 2023-07-24: 10 mg via INTRAVENOUS

## 2023-07-24 MED ORDER — SODIUM CHLORIDE 0.9 % IV SOLN: INTRAVENOUS | Status: DC

## 2023-07-24 MED ORDER — PROPOFOL 10 MG/ML IV BOLUS
INTRAVENOUS | Status: DC | PRN
  Administered 2023-07-24: 30 mg via INTRAVENOUS
  Administered 2023-07-24: 40 mg via INTRAVENOUS
  Administered 2023-07-24 (×2): 30 mg via INTRAVENOUS
  Administered 2023-07-24 (×2): 40 mg via INTRAVENOUS
  Administered 2023-07-24: 50 mg via INTRAVENOUS
  Administered 2023-07-24: 40 mg via INTRAVENOUS

## 2023-07-24 NOTE — Anesthesia Preprocedure Evaluation (Addendum)
 Anesthesia Evaluation  Patient identified by MRN, date of birth, ID band Patient awake    Reviewed: Allergy & Precautions, NPO status , Patient's Chart, lab work & pertinent test results  Airway Mallampati: III  TM Distance: >3 FB Neck ROM: Full    Dental  (+) Edentulous Upper, Edentulous Lower, Lower Dentures, Upper Dentures Pt states dentures are glued in. Plan to keep in for procedure. :   Pulmonary COPD,  COPD inhaler, former smoker   Pulmonary exam normal breath sounds clear to auscultation       Cardiovascular hypertension, Pt. on home beta blockers and Pt. on medications + Peripheral Vascular Disease and +CHF  Normal cardiovascular exam+ dysrhythmias Atrial Fibrillation + Valvular Problems/Murmurs (mod/severe AI) AI  Rhythm:Regular Rate:Normal  TTE 2025  1. Left ventricular ejection fraction, by estimation, is 40 to 45%. The  left ventricle has mildly decreased function. The left ventricle  demonstrates global hypokinesis. There is mild left ventricular  hypertrophy. Left ventricular diastolic parameters  are consistent with Grade I diastolic dysfunction (impaired relaxation).   2. Right ventricular systolic function is normal. The right ventricular  size is normal. There is mildly elevated pulmonary artery systolic  pressure. The estimated right ventricular systolic pressure is 38.0 mmHg.   3. Moderate pleural effusion in both left and right lateral regions.   4. The mitral valve is abnormal. Trivial mitral valve regurgitation.   5. The aortic valve is tricuspid. Aortic valve regurgitation is trivial.  Aortic valve sclerosis/calcification is present, without any evidence of  aortic stenosis.   6. The inferior vena cava is normal in size with greater than 50%  respiratory variability, suggesting right atrial pressure of 3 mmHg.     Neuro/Psych  Headaches  negative psych ROS   GI/Hepatic Neg liver ROS, PUD,GERD  ,,   Endo/Other  negative endocrine ROS    Renal/GU negative Renal ROS  negative genitourinary   Musculoskeletal  (+) Arthritis ,    Abdominal   Peds  Hematology  (+) Blood dyscrasia, anemia   Anesthesia Other Findings Cardiac MRI 06/06/2023 showed LVEF 42%, moderate to severe aortic regurgitation consistent with aortic regurgitation associated with left ventricular dysfunction, evidence of mild ascending dilation of aorta measuring 43 mm.  78 y.o. male with visit-pertinent history of hypertension, hyperlipidemia, diastolic dysfunction, orthostatic hypotension with recurrent syncope, paroxysmal atrial fibrillation, rhabdomyolysis, BPH, CVA, PAD, emphysema, GERD, DJD, HFmrEF  Reproductive/Obstetrics                             Anesthesia Physical Anesthesia Plan  ASA: 3  Anesthesia Plan: MAC   Post-op Pain Management:    Induction: Intravenous  PONV Risk Score and Plan: Propofol  infusion and Treatment may vary due to age or medical condition  Airway Management Planned: Natural Airway  Additional Equipment:   Intra-op Plan:   Post-operative Plan:   Informed Consent: I have reviewed the patients History and Physical, chart, labs and discussed the procedure including the risks, benefits and alternatives for the proposed anesthesia with the patient or authorized representative who has indicated his/her understanding and acceptance.     Dental advisory given  Plan Discussed with: CRNA  Anesthesia Plan Comments:        Anesthesia Quick Evaluation

## 2023-07-24 NOTE — Transfer of Care (Signed)
 Immediate Anesthesia Transfer of Care Note  Patient: Michael Bender  Procedure(s) Performed: TRANSESOPHAGEAL ECHOCARDIOGRAM  Patient Location: Cath Lab  Anesthesia Type:MAC  Level of Consciousness: drowsy  Airway & Oxygen Therapy: Patient Spontanous Breathing and Patient connected to nasal cannula oxygen  Post-op Assessment: Report given to RN and Post -op Vital signs reviewed and stable  Post vital signs: Reviewed and stable  Last Vitals:  Vitals Value Taken Time  BP    Temp    Pulse 47 07/24/23 09:57  Resp 14 07/24/23 09:57  SpO2 99 % 07/24/23 09:57  Vitals shown include unfiled device data.  Last Pain:  Vitals:   07/24/23 0850  TempSrc:   PainSc: 0-No pain         Complications: No notable events documented.

## 2023-07-24 NOTE — Progress Notes (Signed)
     Transesophageal Echocardiogram Note  IVERSON SEES [id number] Feb 26, 1945  Procedure: Transesophageal Echocardiogram Indications: Aortic insufficiency  Procedure Details Consent: Obtained Time Out: Verified patient identification, verified procedure, site/side was marked, verified correct patient position, special equipment/implants available, Radiology Safety Procedures followed,  medications/allergies/relevent history reviewed, required imaging and test results available.  Performed  Medications:  Pt sedated by anesthesia with diprovan 300 mg IV total.  Normal LV function (EF 50-55); moderate LAE; no LAA thrombus; trileaflet aortic valve with oscillating density on left cusp; severe AI; mild MR; trace TR and PA.    Complications: No apparent complications Patient did tolerate procedure well.  Long discussion with patient today.  He has had no fevers, chills or malaise to suggest ongoing endocarditis.  He has no heart failure symptoms including no dyspnea, orthopnea or pedal edema.  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  He was treated with 6 weeks of IV antibiotics and has been maintained on oral antibiotics since then by his report (he is presently on Duricef and doxycycline ).  As outlined above his transesophageal echocardiogram shows vegetation on the aortic valve and severe aortic insufficiency.  This was likely the cause of his congestive heart failure admission in March.  However not clear that his infection is active at this point.  I have discussed the patient with Dr. Lindia of infectious disease and he will arrange follow-up in ID clinic.  I also discussed the patient with Dr. Okey who will arrange follow-up in cardiology clinic as the patient will likely require aortic valve replacement in the future.  We will draw blood cultures today to look for recurrent infection.  If positive he would need  readmission to initiate IV antibiotics.  This was discussed in detail with the patient.  Redell Shallow, MD

## 2023-07-24 NOTE — H&P (Signed)
 Office Visit 07/20/2023 [facility]. [facility] [address]    [place], [place] L, NP Cardiology Heart failure with mildly reduced ejection fraction (HFmrEF) (HCC) +7 more Dx Follow-up ; Referred by [name], MD Reason for Visit   Additional Documentation  Vitals: BP 138/50 Important   (BP Location: Left Arm, Patient Position: Sitting, Cuff Size: Normal)   Pulse 79   Ht 5' 11 (1.803 m)   Wt 67.6 kg   BMI 20.78 kg/m   BSA 1.84 m      More Vitals  Flowsheets: Anthropometrics,   NEWS,   MEWS Score,   Vital Signs  Encounter Info: Billing Info,   History,   Allergies,   Detailed Report   All Notes   Progress Notes by [name], NP at 07/20/2023 10:30 AM  Author: [name], NP Author Type: Nurse Practitioner Filed: 07/20/2023  5:28 PM  Note Status: Signed Cosign: Cosign Not Required Encounter Date: 07/20/2023  Editor: [name], NP (Nurse Practitioner)             Expand All Collapse All  Cardiology Office Note:     Date:  07/20/2023  ID:  Michael Bender, DOB 27-Jul-1945, MRN [id number] PCP: [name], MD  [facility] Providers Cardiologist:  Vina Gull, MD       Patient Profile:     Subjective Chief Complaint: 6-week follow-up History of Present Illness:  JAIDEEP POLLACK is a 78 y.o. male with visit-pertinent history of hypertension, hyperlipidemia, diastolic dysfunction, orthostatic hypotension with recurrent syncope, paroxysmal atrial fibrillation, rhabdomyolysis, BPH, CVA, PAD, emphysema, GERD, DJD, HFmrEF   Echocardiogram 11/2022 showed LVEF 60 to 65%.   He had recent admission on 11/2022 for falls, weakness, and rhabdomyolysis and ultimately diagnosed with MSSA bacteremia.  Of note he did have new onset A-fib with RVR during this admission and was started on IV diltiazem  and spontaneously converted to NSR.  It was noted he was not a candidate for anticoagulation due to  his recurrent falls.  He was discharged to skilled nursing facility but then brought back with sepsis due to MSSA bacteremia on 02/2023.  Current 6 weeks of IV antibiotics and was followed by infectious disease.   Recently admitted on 04/2023 with acute on chronic heart failure.  Echocardiogram showed LVEF 40 to 45%, grade 1 diastolic dysfunction, normal RV.  He was diuresed with IV Lasix .  Had AKI so cardiac cath was deferred.  GDMT was titrated and he was discharged home at weight 136 LBS.   He followed up with advanced heart failure clinic on 05/01/2023.  It is unclear the etiology for the reduction in his EF.  Could be related to recent MSSA sepsis versus uncontrolled hypertension.  He was NYHA class II and volume status okay during clinic visit.  He was started on losartan  25 mg daily.  He is continued on hydralazine  50 mg 3 times daily and Imdur  30 mg daily.  SGLT2i was avoided due to concern for hygiene and volume depletion.  Cardiac MRI was ordered to evaluate for infiltrative disease and is currently pending.   Was seen for follow-up with advanced heart failure clinic on 05/19/2023.  He continued with NYHA class II symptoms.  His volume status was stable.  Medication management was continued.    He was last seen in clinic on 06/07/2023.  He is doing well at the time.  His blood pressure was elevated  in office at 164/74.  His losartan  was discontinued and he was started on Entresto  49 to 51 mg twice daily and spironolactone  12.5 mg daily.  Follow-up lab work on 05/19/2023 showed worsening creatinine of 1.6.  Spironolactone  was discontinued at that time.   Cardiac MRI 06/06/2023 showed LVEF 42%, moderate to severe aortic regurgitation consistent with aortic regurgitation associated with left ventricular dysfunction, evidence of mild ascending dilation of aorta measuring 43 mm.     Discussed the use of AI scribe software for clinical note transcription with the patient, who gave verbal consent to  proceed.   History of Present Illness Michael Bender is a 78 year old male who presents today for follow-up.   Today he presents to office with his son.  He tells me he is doing well overall.  He is without any acute cardiovascular concerns or complaint at this time.  He has no chest pain, shortness of breath, dizziness, lightheadedness, orthopnea, PND, or dyspnea. Blood pressure is stable with medication adherence.    He maintains regular physical activity without limitation.  Weight is stable with no peripheral edema.   Current medications include Entresto , amlodipine , Jardiance , and hydralazine . He is not on spironolactone  due to renal function concerns. His son assists with medication management. Renal function is variable with fluctuating creatinine levels.   Review of systems:  Please see the history of present illness. All other systems are reviewed and otherwise negative.   Objective Studies Reviewed:         Cardiac MRI 06/06/2023 IMPRESSION: 1. Mild decrease left ventricular systolic function (LVEF =42%).   2. Moderate to severe regurgitation with regurgitant fraction 35% with moderate left ventricular dilation. This is markedly different that color Doppler evaluation on recent echocardiogram. Consider TEE assessment as this MRI is consistent with aortic regurgitation associated left ventricular dysfunction.   3. Evidence of mild ascending aortic dilation, 43 mm.   Echocardiogram 04/21/2023  1. Left ventricular ejection fraction, by estimation, is 40 to 45%. The  left ventricle has mildly decreased function. The left ventricle  demonstrates global hypokinesis. There is mild left ventricular  hypertrophy. Left ventricular diastolic parameters  are consistent with Grade I diastolic dysfunction (impaired relaxation).   2. Right ventricular systolic function is normal. The right ventricular  size is normal. There is mildly elevated pulmonary artery systolic  pressure. The  estimated right ventricular systolic pressure is 38.0 mmHg.   3. Moderate pleural effusion in both left and right lateral regions.   4. The mitral valve is abnormal. Trivial mitral valve regurgitation.   5. The aortic valve is tricuspid. Aortic valve regurgitation is trivial.  Aortic valve sclerosis/calcification is present, without any evidence of  aortic stenosis.   6. The inferior vena cava is normal in size with greater than 50%  respiratory variability, suggesting right atrial pressure of 3 mmHg.    Risk Assessment/Calculations:               Physical Exam:   VS:  BP (!) 138/50 (BP Location: Left Arm, Patient Position: Sitting, Cuff Size: Normal)   Pulse 79   Ht 5' 11 (1.803 m)   Wt 149 lb (67.6 kg)   BMI 20.78 kg/m       Wt Readings from Last 3 Encounters:  07/20/23 149 lb (67.6 kg)  07/14/23 150 lb 3.2 oz (68.1 kg)  06/29/23 148 lb 9.6 oz (67.4 kg)    GEN: Well nourished, well developed in no acute distress NECK: No JVD;  No carotid bruits CARDIAC: RRR, no murmurs, rubs, gallops RESPIRATORY:  Clear to auscultation without rales, wheezing or rhonchi  ABDOMEN: Soft, non-tender, non-distended EXTREMITIES:  No edema; No acute deformity        Assessment and Plan:  HFmrEF Echocardiogram 11/2022 with LVEF 60 to 65% Echocardiogram 04/2023 with EF 40-45% Cardiac MRI 06/2023 with LVEF 42% and RVEF 48% with no LGE or evidence of infiltrative disease Cardiac MRI was consistent with aortic regurgitation associated with left ventricular dysfunction.  Spoke with DOD Dr. Jeffrie who reviewed patient's MRI prior echocardiogram on 04/2023 which read trivial AR.  Dr. Jeffrie did suggest TEE for further evaluation There is unclear etiology for reduction in his LVEF.  Could also be related to septic cardiomyopathy versus uncontrolled HTN.  As noted cardiac cath was deferred due to CKD Per AHF clinic patient is not a candidate for advanced therapies with significant COPD and CKD - Today  patient is euvolemic and well compensated on exam.  NYHA class II (chronic stable dyspnea h/o COPD).  Denies SOB at rest, orthopnea, PND, chest pains - Unable to start spironolactone  given CKD - Plan to start bisoprolol  5 mg daily today to further optimize GDMT - GDMT currently: Bisoprolol  5 mg daily, Entresto  49-51 mg twice daily, Jardiance  10 mg daily - Continue hydralazine  50 mg 3 times daily and Imdur  60 mg daily - BMET today and again in 2 weeks   Aortic regurgitation Echocardiogram 04/2023 showed trivial aortic valve regurgitation Cardiac MRI 06/2023 showed moderate to severe regurgitation with regurgitant fraction of 35% with moderate left ventricular dilation likely consistent with aortic regurgitation associated left ventricular dysfunction - Today he is overall asymptomatic.  He denies any syncope, presyncope, dizziness, lightheadedness, chronic DOE (COPD) remained stable - As noted above reviewed MRI and prior echo with Dr. Jeffrie who recommended TEE for further evaluation   CKD stage III Creatinine 1.77 and GFR 39 on 06/29/2023 Avoid NSAIDs and stay adequately hydrated - Repeat BMET today and again in 2 weeks   Hypertension Blood pressure today is better controlled at 138/50 Blood pressure has previously been uncontrolled - Continue HF GDMT of bisoprolol  5 mg daily, Entresto  49-51 mg twice daily - Continue amlodipine  5 mg daily, hydralazine  50 mg every 8 hours, Imdur  60 mg daily   Paroxysmal atrial fibrillation Episode noted during admission with sepsis/MSSA bacteremia on 11/2022 and spontaneously converted to sinus rhythm.  Was noted not to be a candidate for anticoagulation due to recurrent falls at the time as he was also noted to have history of GI bleed due to duodenal ulcer in 2021 - Patient denies any symptoms concerning for recurrent atrial fibrillation   Iron deficiency anemia Hemoglobin 10.2 on 06/2023 - Continue iron/vitamin supplement - Management per PCP    Informed Consent Shared Decision Making/Informed Consent   The risks [esophageal damage, perforation (1:10,000 risk), bleeding, pharyngeal hematoma as well as other potential complications associated with conscious sedation including aspiration, arrhythmia, respiratory failure and death], benefits (treatment guidance and diagnostic support) and alternatives of a transesophageal echocardiogram were discussed in detail with Mr. Kerce and he is willing to proceed.       Dispo:  Return in about 3 months (around 10/20/2023).   Signed, Lum LITTIE Louis, NP        Contains text generated by [employer]  For TEE to assess AI; no changes. Redell Shallow

## 2023-07-25 ENCOUNTER — Ambulatory Visit: Admitting: Internal Medicine

## 2023-07-25 NOTE — Anesthesia Postprocedure Evaluation (Signed)
 Anesthesia Post Note  Patient: CASE VASSELL  Procedure(s) Performed: TRANSESOPHAGEAL ECHOCARDIOGRAM     Patient location during evaluation: Cath Lab Anesthesia Type: MAC Level of consciousness: awake and alert Pain management: pain level controlled Vital Signs Assessment: post-procedure vital signs reviewed and stable Respiratory status: spontaneous breathing, nonlabored ventilation, respiratory function stable and patient connected to nasal cannula oxygen Cardiovascular status: blood pressure returned to baseline and stable Postop Assessment: no apparent nausea or vomiting Anesthetic complications: no  No notable events documented.  Last Vitals:  Vitals:   07/24/23 1040 07/24/23 1045  BP: (!) 139/41 137/65  Pulse: (!) 45 (!) 45  Resp: 13 14  Temp:    SpO2: 98% 97%    Last Pain:  Vitals:   07/24/23 1030  TempSrc:   PainSc: 0-No pain   Pain Goal:                   Bhargav Barbaro L Colby Reels

## 2023-07-29 LAB — CULTURE, BLOOD (ROUTINE X 2)
Culture: NO GROWTH
Culture: NO GROWTH
Special Requests: ADEQUATE
Special Requests: ADEQUATE

## 2023-08-02 ENCOUNTER — Other Ambulatory Visit: Payer: Self-pay | Admitting: *Deleted

## 2023-08-02 ENCOUNTER — Ambulatory Visit: Payer: Self-pay | Admitting: Emergency Medicine

## 2023-08-02 DIAGNOSIS — I351 Nonrheumatic aortic (valve) insufficiency: Secondary | ICD-10-CM

## 2023-08-03 ENCOUNTER — Encounter: Payer: Self-pay | Admitting: Internal Medicine

## 2023-08-03 ENCOUNTER — Other Ambulatory Visit: Payer: Self-pay

## 2023-08-03 ENCOUNTER — Ambulatory Visit: Admitting: Internal Medicine

## 2023-08-03 VITALS — BP 159/46 | HR 96 | Temp 98.2°F | Wt 144.0 lb

## 2023-08-03 DIAGNOSIS — R931 Abnormal findings on diagnostic imaging of heart and coronary circulation: Secondary | ICD-10-CM | POA: Diagnosis not present

## 2023-08-03 NOTE — Progress Notes (Signed)
 Subjective:   Chief Complaint: follow-up for disseminated MSSA infection with bacteremia, left septic wrist and concerns for right knee infection   Patient ID: Michael Bender, male    DOB: Jun 13, 1945, 78 y.o.   MRN: [id number]  HPI  78 y.o. male with history of cervical lumbar spine hardware and recent admission for falls weakness and rhabdomyolysis who was discharged to skilled nursing facility but then brought back with sepsis due to MSSA bacteremia.  He had fallen but also headaches was at pain in his left wrist as well as pain in his right knee and index finger.   He has been proven to have a left septic wrist and now status post I&D.     MRI of the hand shows multifocal osteoarthritic changes throughout the second hand most advanced at the second MCP joint with nonspecific marrow edema enhancement around the second metacarpal phalangeal joint with surrounding soft tissue swelling concerning for inflammatory arthropathy versus septic arthritis   MRI of the C-spine does show some mild reactive endplate changes and marrow edema from C5-C6 which could be degenerative or could be early osteomyelitis discitis.   Orthopedic hand surgery followed him closely in the hospital and orthopedics also evaluated his knee several times and did not feel it was infected.   I had asked them to see him and talk to Dr. Alan.  My concern was that while the patient never grew an organism from his knee and while his cell count in the knee at 8500 with 94% neutrophils while NOT typically high value of 50-100K it was NOT normal and we have not alternative explanation for why he had a joint effusion and we knew that he had MSSA bacteremia and we also knew that he had been on antibiotics for several days prior to the knee having been tapped.    Interim history:   He had been residing at [facility] receiving cefazolin  every 8 hours.   At last visit he had been doing relatively well with  the exception of the right knee which continues to bother him that fluid has built up and he also was having reduced range of motion he has pain when he puts weight on it and sometimes if he has vigorous physical therapy the pain goes on for hours afterwards. His inflammatory markers were also markedly elevated.  We obtained urgent follow-up with [facility] the following day.  They reportedly were still not thinking that he had evidence for infection in the knee but did make a follow-up appointment for him  Have the report from that visit with Dr. Alan see below      Dr. Sherida saw the patient again on the 4th but I do not have access to those notes.  I ordered MRI of the knee which was completed on 01/05/2023 but was read this morning on  1217/2024    IMPRESSION: 1. Worsening of the prior tear in the posterior horn of the medial meniscus, now with a large radial component in the posterior horn adjacent to the meniscal root. 2. Worsening of the subcortical stress fracture or osteochondral lesion anteromedially along the medial femoral condyle with worsened adjacent marrow edema. New subcortical stress fracture or osteochondral lesion along the medial tibial plateau with surrounding marrow edema. 3. Moderate to large knee joint effusion with mildly worsened synovitis compared to previous, currently moderate. Given the worsening I cannot confidently exclude the possibility of early septic joint, and arthrocentesis may be warranted if  there is clinical suspicion. 4. 0.8 cm curvilinear suspected free osteochondral fragment just posterior to the lateral portion of the medial femoral condyle. 5. Moderate to severe tricompartmental osteoarthritis.     The patient and his son (1 of 2 who accompany him from time to time) are voicing desire for 2nd opinion and I am frankly frustrated that it has been more than 2 months if not longer. I dont understand why a bacteremic pt with a septic  wrist w a knee effusion with 8k cells on abx would not be considered potentially infected and it would seem prudent to have an I and D at minimum arthroscopically.  I will reach out and see if I can find someone for a 2nd opinion. If not will reconnect with Dr. Sherida  I may also soon take him OFF of antibiotics and if I cannot find an orthopedist to work on him aspirate his knee off of antibiotics 2-4 weeks after stopping abx.  His knee pain incidentally is continuing to worsen particularly when he bears weight.  -------------------- 02/08/23 id clinic f/u Hx mssa infection no tee previously 6 weeks cefazolin  by 12/21/22 On cefadroxil  now Has lumbar spine hardware no new sx there Knee pain and tapped but on abx so 8k wbc and cx negative; right knee  Today f/u for consideration stopping abx vs more workup  Mri from 12/5 reviewed of the right knee  Saw orthopedics clinic yesterday who doesn't think there is an infection at this time  Still on cefadroxil  and taking since iv abx finished Knee pain 8/10 constant stable; pain worse with twisting motion  No fever/chill  No n/v/diarrhea  No other focal pain except the knee    --------- 08/03/23 id clinic f/u See a&p for detail    Lab Results  Component Value Date   CRP 11.6 (H) 02/22/2023   Lab Results  Component Value Date   ESRSEDRATE 19 02/22/2023     Past Medical History:  Diagnosis Date   Acute combined systolic and diastolic heart failure (HCC) 04/22/2023   Acute on chronic diastolic CHF (congestive heart failure) (HCC) 04/20/2023   Arthritis    Back   Balance problems 02/21/2023   Blood transfusion    as a child   Chronic back pain    COPD (chronic obstructive pulmonary disease) (HCC)    Disturbance of skin sensation 05/22/2018   Effusion of right knee 02/21/2023   Effusion of right knee joint 01/16/2023   Elevated troponin 04/22/2023   Essential hypertension 03/05/2007   Qualifier: Diagnosis of   By: Krystal  MD, Reyes A      Finger pain, left 11/08/2022   Gastric erosion    Gastritis and gastroduodenitis    Gastrointestinal hemorrhage with melena 11/20/2018   GIB (gastrointestinal bleeding) 07/18/2019   Hardware complicating wound infection (HCC) 11/10/2022   Headache(784.0)    last one 6 months ago   Hemorrhoids, internal    High risk medication use 03/03/2022   History of duodenal ulcer    History of lumbar fusion 03/03/2022   RE-OPERATIVE DIAGNOSIS:  lumbar stenosis synovial cyst lumbar spondylosis spondylolisthesis lumbar radiculoapthy L4/5   PROCEDURE:  Procedure(s): POSTERIOR LUMBAR FUSION 1 LEVEL with resection of synovial cyst     History of upper gastrointestinal bleeding 03/03/2022   Duodenal ulcer 2021 Dr. Avram   HLD (hyperlipidemia)    Hypertension    Hypokalemia 04/22/2023   Hypomagnesemia 04/22/2023   Infected blister of left index finger 11/07/2022   Intractable  pain 03/03/2022   Loss of weight    Wt Readings from Last 10 Encounters:  04/05/22  146 lb (66.2 kg)  03/03/22  143 lb 9.6 oz (65.1 kg)  07/14/21  153 lb (69.4 kg)  12/14/20  147 lb 12.8 oz (67 kg)  11/13/19  154 lb (69.9 kg)  09/16/19  146 lb (66.2 kg)  08/06/19  146 lb (66.2 kg)  07/19/19  144 lb 13.5 oz (65.7 kg)  07/17/19  148 lb (67.1 kg)  07/09/19  147 lb 9.6 oz (67 kg)         MSSA bacteremia 11/07/2022   Neck rigidity    post cervical fusion   Nocturia    PAIN, CHRONIC NEC 10/06/2006   Qualifier: Diagnosis of   By: Krystal RN, Leeroy       Pneumonia    Prostate disease    Right sided temporal headache 05/22/2018   S/P cervical spinal fusion 03/03/2022   With persistent cervical pain and palpable screws Led to disability History of attempt to dig furrow in skull to fix it Last surgery 1992   Senile ecchymosis 01/21/2019   Septic arthritis of wrist, left (HCC) 11/08/2022   Septic infrapatellar bursitis of right knee 11/07/2022   Staphylococcal Arthritis of Right Knee (Updated 01/30/2023) MRI  01/17/2023 shows worsening findings:  Worsening tear of posterior horn medial meniscus with large radial component Worsening subcortical stress fracture/osteochondral lesion of medial femoral condyle New subcortical stress fracture/osteochondral lesion of medial tibial plateau Moderate-to-large effusion with worsened synovitis    Staphylococcal arthritis of left wrist (HCC) 11/07/2022   Staphylococcal arthritis of right knee (HCC) 11/08/2022   Syncope 11/05/2022    Past Surgical History:  Procedure Laterality Date   BIOPSY  07/19/2019   Procedure: BIOPSY;  Surgeon: San Sandor GAILS, DO;  Location: [facility];  Service: Gastroenterology;;   CERVICAL FUSION  1992   C2/C 3  four surgeries   COLONOSCOPY     ESOPHAGOGASTRODUODENOSCOPY  07/20/2011   Procedure: ESOPHAGOGASTRODUODENOSCOPY (EGD);  Surgeon: Lupita FORBES Commander, MD;  Location: [facility];  Service: Endoscopy;  Laterality: N/A;   ESOPHAGOGASTRODUODENOSCOPY (EGD) WITH PROPOFOL  N/A 07/19/2019   Procedure: ESOPHAGOGASTRODUODENOSCOPY (EGD) WITH PROPOFOL ;  Surgeon: San Sandor GAILS, DO;  Location: [facility];  Service: Gastroenterology;  Laterality: N/A;   I & D EXTREMITY Left 11/09/2022   Procedure: IRRIGATION AND DEBRIDEMENT LEFT WRIST;  Surgeon: Arlinda Buster, MD;  Location: [facility];  Service: Orthopedics;  Laterality: Left;   SAVORY DILATION  07/20/2011   Procedure: SAVORY DILATION;  Surgeon: Lupita FORBES Commander, MD;  Location: [facility];  Service: Endoscopy;  Laterality: N/A;   SPINAL FUSION  05/06/11   TRANSESOPHAGEAL ECHOCARDIOGRAM (CATH LAB) N/A 07/24/2023   Procedure: TRANSESOPHAGEAL ECHOCARDIOGRAM;  Surgeon: Pietro Redell RAMAN, MD;  Location: [facility] INVASIVE CV [facility];  Service: Cardiovascular;  Laterality: N/A;    Family History  Problem Relation Age of Onset   Heart disease Mother    Heart attack Mother 24   Pancreatic cancer Father 66   Pancreatic cancer Brother    Anesthesia problems Neg Hx    Colon cancer Neg Hx    Liver  cancer Neg Hx    Stomach cancer Neg Hx    Esophageal cancer Neg Hx    Rectal cancer Neg Hx       Social History   Socioeconomic History   Marital status: Widowed    Spouse name: Not on file   Number of children: 2   Years of education:  Not on file   Highest education level: High school graduate  Occupational History   Occupation: Disabled  Tobacco Use   Smoking status: Former    Current packs/day: 0.00    Types: Cigarettes    Start date: 12/12/1978    Quit date: 12/12/2018    Years since quitting: 4.6   Smokeless tobacco: Never  Vaping Use   Vaping status: Never Used  Substance and Sexual Activity   Alcohol use: Not Currently    Alcohol/week: 2.0 standard drinks of alcohol    Types: 2 Shots of liquor per week   Drug use: No   Sexual activity: Not on file  Other Topics Concern   Not on file  Social History Narrative   Patient is divorced 2 sons he is a retired Engineer, 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  He drinks about a liter of caffeinated drinks a day past smoker no drug use no alcohol.   Social Drivers of Corporate investment banker Strain: Low Risk  (04/24/2023)   Overall Financial Resource Strain (CARDIA)    Difficulty of Paying Living Expenses: Not very hard  Food Insecurity: No Food Insecurity (04/26/2023)   Hunger Vital Sign    Worried About Running Out of Food in the Last Year: Never true    Ran Out of Food in the Last Year: Never true  Transportation Needs: No Transportation Needs (04/26/2023)   PRAPARE - Administrator, Civil Service (Medical): No    Lack of Transportation (Non-Medical): No  Physical Activity: Insufficiently Active (10/13/2022)   Exercise Vital Sign    Days of Exercise per Week: 3 days    Minutes of Exercise per Session: 30 min  Stress: No Stress Concern Present (10/13/2022)   [employer] of Occupational Health - Occupational Stress Questionnaire    Feeling of Stress : Not at all   Social Connections: Socially Isolated (04/20/2023)   Social Connection and Isolation Panel    Frequency of Communication with Friends and Family: Twice a week    Frequency of Social Gatherings with Friends and Family: More than three times a week    Attends Religious Services: Never    Database administrator or Organizations: No    Attends Banker Meetings: Never    Marital Status: Widowed    Allergies  Allergen Reactions   Nubain [Nalbuphine Hcl]     Muscle contraction     Current Outpatient Medications:    amitriptyline  (ELAVIL ) 50 MG tablet, Take 1 tablet (50 mg total) by mouth at bedtime., Disp: 90 tablet, Rfl: 3   amLODipine  (NORVASC ) 5 MG tablet, Take 1 tablet (5 mg total) by mouth daily., Disp: 30 tablet, Rfl: 0   bisoprolol  (ZEBETA ) 5 MG tablet, Take 1 tablet (5 mg total) by mouth daily., Disp: 90 tablet, Rfl: 1   cefadroxil  (DURICEF) 500 MG capsule, Take 500 mg by mouth 2 (two) times daily., Disp: , Rfl:    cyclobenzaprine  (FLEXERIL ) 10 MG tablet, Take 1 tablet (10 mg total) by mouth 3 (three) times daily as needed., Disp: 270 tablet, Rfl: 3   doxycycline  (VIBRA -TABS) 100 MG tablet, Take 1 tablet (100 mg total) by mouth 2 (two) times daily., Disp: 20 tablet, Rfl: 0   empagliflozin  (JARDIANCE ) 10 MG TABS tablet, Take 1 tablet (10 mg total) by mouth daily before breakfast., Disp: 90 tablet, Rfl: 3   FeFum-FePoly-FA-B Cmp-C-Biot (INTEGRA PLUS ) CAPS, Take 1 capsule  by mouth daily., Disp: 30 capsule, Rfl: 5   hydrALAZINE  (APRESOLINE ) 50 MG tablet, Take 1 tablet (50 mg total) by mouth every 8 (eight) hours., Disp: 90 tablet, Rfl: 0   ipratropium-albuterol  (DUONEB) 0.5-2.5 (3) MG/3ML SOLN, Take 3 mLs by nebulization every 4 (four) hours as needed., Disp: 90 mL, Rfl: 1   isosorbide  mononitrate (IMDUR ) 60 MG 24 hr tablet, Take 1 tablet (60 mg total) by mouth daily., Disp: 30 tablet, Rfl: 0   Oxycodone  HCl 10 MG TABS, Take 1 tablet (10 mg total) by mouth every 8 (eight)  hours as needed., Disp: 90 tablet, Rfl: 0   pantoprazole  (PROTONIX ) 40 MG tablet, Take 1 tablet (40 mg total) by mouth daily., Disp: 90 tablet, Rfl: 3   polyethylene glycol (MIRALAX  / GLYCOLAX ) 17 g packet, Take 17 g by mouth daily as needed for moderate constipation., Disp: 14 each, Rfl: 0   Respiratory Therapy Supplies (NEBULIZER/TUBING/MOUTHPIECE) KIT, 1 each by Does not apply route every 4 (four) hours as needed., Disp: 1 kit, Rfl: 1   Respiratory Therapy Supplies (NEBULIZER/TUBING/MOUTHPIECE) KIT, 1 each by Does not apply route every 4 (four) hours as needed., Disp: 1 kit, Rfl: 1   rosuvastatin  (CRESTOR ) 40 MG tablet, TAKE 1 TABLET BY MOUTH DAILY. REPLACES ATORVASTATIN  (STOP ATORVASTATIN  IF STILL TAKING), Disp: 90 tablet, Rfl: 3   sacubitril-valsartan (ENTRESTO ) 49-51 MG, Take 1 tablet by mouth 2 (two) times daily., Disp: 180 tablet, Rfl: 2   spironolactone  (ALDACTONE ) 25 MG tablet, Take 12.5 mg by mouth daily., Disp: , Rfl:    SUMAtriptan  (IMITREX ) 50 MG tablet, TAKE 1 TABLET (50 MG TOTAL) BY MOUTH DAILY. MAY REPEAT IN 2 HOURS IF HEADACHE PERSISTS OR RECURS., Disp: 9 tablet, Rfl: 1   Ros: All other ros negative     Objective:   Physical Exam Constitutional:      Appearance: He is well-developed.  HENT:     Head: Normocephalic and atraumatic.  Eyes:     Conjunctiva/sclera: Conjunctivae normal.  Cardiovascular:     Rate and Rhythm: Normal rate and regular rhythm.  Pulmonary:     Effort: Pulmonary effort is normal. No respiratory distress.     Breath sounds: No wheezing.  Abdominal:     General: There is no distension.     Palpations: Abdomen is soft.  Musculoskeletal:        General: No tenderness. Normal range of motion.     Cervical back: Normal range of motion and neck supple.  Skin:    General: Skin is warm and dry.     Coloration: Skin is not pale.     Findings: No erythema or rash.  Neurological:     General: No focal deficit present.     Mental Status: He is alert  and oriented to person, place, and time.  Psychiatric:        Mood and Affect: Mood normal.        Behavior: Behavior normal.        Thought Content: Thought content normal.        Judgment: Judgment normal.    Labs: Lab Results  Component Value Date   WBC 4.6 07/21/2023   HGB 9.7 (L) 07/21/2023   HCT 28.8 (L) 07/21/2023   MCV 88.8 07/21/2023   PLT 129.0 (L) 07/21/2023   Last metabolic panel Lab Results  Component Value Date   GLUCOSE 119 (H) 07/20/2023   NA 135 07/20/2023   K 5.2 07/20/2023   CL 100 07/20/2023  CO2 21 07/20/2023   BUN 30 (H) 07/20/2023   CREATININE 1.85 (H) 07/20/2023   EGFR 37 (L) 07/20/2023   CALCIUM  8.4 (L) 07/20/2023   PHOS 3.7 10/28/2022   PROT 7.4 06/29/2023   ALBUMIN 4.0 06/29/2023   LABGLOB 3.5 06/29/2023   AGRATIO 0.9 06/29/2023   BILITOT 0.4 06/29/2023   ALKPHOS 110 06/29/2023   AST 26 06/29/2023   ALT 10 06/29/2023   ANIONGAP 7 06/29/2023      Component Value Date/Time   CRP 11.6 (H) 02/22/2023 0954   CRP 0.8 02/17/2023 1844   CRP 5.6 01/17/2023 1039   Lab Results  Component Value Date   ESRSEDRATE 19 02/22/2023   Imaging: Reviewed   07/24/23 tee  1. Oscillating, linear density (0.7-0.8 cm) on left coronary cusp of  aortic valve concerning for vegetation; severe AI (best seen on  transgastric views).   2. Left ventricular ejection fraction, by estimation, is 50 to 55%. The  left ventricle has low normal function. The left ventricle has no regional  wall motion abnormalities. The left ventricular internal cavity size was  mildly dilated.   3. Right ventricular systolic function is mildly reduced. The right  ventricular size is normal.   4. Left atrial size was moderately dilated. No left atrial/left atrial  appendage thrombus was detected.   5. The mitral valve is normal in structure. Mild mitral valve  regurgitation.   6. The aortic valve is tricuspid. Aortic valve regurgitation is severe.   7. There is Moderate (Grade  III) plaque involving the descending aorta.   8. 3D performed of the aortic valve.            Assessment & Plan:   78 year old patient admitted with MSSA bacteremia and sepsis with left septic wrist status post I&D also with knee effusion with over 8000 white blood cells when tapped on IV antibiotics.  I remain concerned that the knee is infected his left wrist appears cured and orthopedic surgery hand have reviewed least him.  Of note he also had imaging of his spine performed which showed possibly some reactive endplate marrow changes in the C-spine thought to be degenerative versus infectious.  At present today we will get a sed rate CRP CBC with differential and BMP with GFR.  I will continue cefadroxil  for now.  I will endeavor to get him a second opinion but if that is not successful we will reengage with Dr. Sherida and advocate strongly for surgery.  If we are still not making progress I will stop his antibiotics and have knee aspirated by IR a month after stopping  --------- 02/08/23 id assessment Stable knee sx and no sign systemic sepsis and also normalized sed rate/crp on a few months of abx now Course of abx finished for presumed endocarditis and septic arthritis  Discuss only way to tell really if his infection (at this time the right knee) is cured is observation off abx  I am not sure if any imaging even with abnormality would help further workup at this time   Exam today no swelling or warmth right knee; some tenderness medial anterior joint line  -stop abx -f/u 2 weeks from now to repeat crp/cbc (1/22)  -if sx continues to be stable then another couple visits every 6 weeks up to 3 months from stopping abx  -if continues to do well then infection likely cured and can discharge from clinic -if f/c, worsening knee pain swelling redness after stopping abx let us   know and we can see sooner as needed  -since sx stable today will not do  labs   ------------------ 08/03/23 id clinic assessment Patient has been followed by cardiology for heart failure Has MRI that shows AR so tee obtained and showed av veg 6/23 bcx x2 negative He had some clindamycin 07/17/23 for a tick bite skin changes He has been otherwise at his normal state of health  I reviewed mssa infection again --> he has had 6 weeks iv cefazolin  then transitioned to an oral tail of cefadroxil  (abx course started 11/2022). We had wanted to treat for presumptive endocarditis  He has done fine since treatment and continues to do so without sign of sepsis   I suspect the av veg (with negative bcx recently and no sign of sepsis) is residual from prior infection 11/2022 mssa bsi   I'll forward chart to Dr Pietro. If he feels strongly about treatment again we'll arrange 6 weeks ceftriaxone /daptomycin  Will let patient know the plan once cardiology responds  Epic message sent to Redell Pietro and [facility]

## 2023-08-03 NOTE — Patient Instructions (Signed)
 I think your echo finding reflects old sign of previous infection.   I'll discuss case with your cardiology team -- if they feel strongly about treating again, we can arrange to put picc line in and put you on 6 weeks of daptomycin and ceftriaxone    Lots of time when you have a heart valve infection, even after treatment, the vegetation remains (but sterilized)

## 2023-08-07 ENCOUNTER — Encounter: Payer: Self-pay | Admitting: Internal Medicine

## 2023-08-08 ENCOUNTER — Encounter: Payer: Self-pay | Admitting: Internal Medicine

## 2023-08-08 ENCOUNTER — Telehealth: Payer: Self-pay

## 2023-08-08 NOTE — Telephone Encounter (Addendum)
 Per Dr Okey:  Re Mr Douty and LHC This is not an unrisky test given mobile density on AV I think he should see surgery first If they say they would operate then he should have LHC.   Dr Okey has spoken with [facility]... will follow.

## 2023-08-14 ENCOUNTER — Encounter: Payer: Self-pay | Admitting: Internal Medicine

## 2023-08-14 ENCOUNTER — Ambulatory Visit (INDEPENDENT_AMBULATORY_CARE_PROVIDER_SITE_OTHER): Admitting: Internal Medicine

## 2023-08-14 VITALS — BP 122/60 | HR 61 | Temp 97.2°F | Ht 71.0 in | Wt 150.6 lb

## 2023-08-14 DIAGNOSIS — I351 Nonrheumatic aortic (valve) insufficiency: Secondary | ICD-10-CM | POA: Insufficient documentation

## 2023-08-14 DIAGNOSIS — M5442 Lumbago with sciatica, left side: Secondary | ICD-10-CM | POA: Diagnosis not present

## 2023-08-14 DIAGNOSIS — G8929 Other chronic pain: Secondary | ICD-10-CM | POA: Diagnosis not present

## 2023-08-14 DIAGNOSIS — Z79899 Other long term (current) drug therapy: Secondary | ICD-10-CM | POA: Diagnosis not present

## 2023-08-14 DIAGNOSIS — M542 Cervicalgia: Secondary | ICD-10-CM

## 2023-08-14 DIAGNOSIS — D649 Anemia, unspecified: Secondary | ICD-10-CM

## 2023-08-14 DIAGNOSIS — M5441 Lumbago with sciatica, right side: Secondary | ICD-10-CM | POA: Diagnosis not present

## 2023-08-14 DIAGNOSIS — I739 Peripheral vascular disease, unspecified: Secondary | ICD-10-CM

## 2023-08-14 DIAGNOSIS — N1832 Chronic kidney disease, stage 3b: Secondary | ICD-10-CM | POA: Diagnosis not present

## 2023-08-14 DIAGNOSIS — J449 Chronic obstructive pulmonary disease, unspecified: Secondary | ICD-10-CM | POA: Diagnosis not present

## 2023-08-14 DIAGNOSIS — R799 Abnormal finding of blood chemistry, unspecified: Secondary | ICD-10-CM | POA: Insufficient documentation

## 2023-08-14 LAB — LIPID PANEL
Cholesterol: 93 mg/dL (ref 0–200)
HDL: 46 mg/dL (ref >39.00)
LDL Cholesterol: 36 mg/dL (ref 0–99)
NonHDL: 47.33
Total CHOL/HDL Ratio: 2
Triglycerides: 58 mg/dL (ref 0.0–149.0)
VLDL: 11.6 mg/dL (ref 0.0–40.0)

## 2023-08-14 MED ORDER — OXYCODONE HCL 10 MG PO TABS: 10.0000 mg | ORAL_TABLET | Freq: Three times a day (TID) | ORAL | 0 refills | Status: DC | PRN

## 2023-08-14 NOTE — Assessment & Plan Note (Signed)
 Chronic low back pain is managed with oxycodone , providing adequate analgesia at the current dose. There is concern for opioid-induced constipation. Oxycodone  10 mg will be continued as prescribed, and Miralax  will be used as needed for constipation. A referral to pain management specialist Heather is pending; an alternative referral will be considered if there is no response.

## 2023-08-14 NOTE — Assessment & Plan Note (Signed)
 Since 06/2023, likely related with chronic kidney disease but that's been stable since 2024. ? Medication(s) related? Will order lab testing to guide management.

## 2023-08-14 NOTE — Assessment & Plan Note (Signed)
 Chronic kidney disease stage 3B is likely secondary to a previous systemic infection, with GFR between 30-45 for the past 7-8 months. Elevated BUN suggests possible dehydration, potentially due to diuretic use. The condition is far from requiring dialysis. Renal function will be monitored, and no immediate blood work is needed. Dehydration will be assessed, and diuretic use may be adjusted if necessary.

## 2023-08-14 NOTE — Assessment & Plan Note (Signed)
 Anemia is likely secondary to chronic kidney disease and possible hemolysis from aortic valve regurgitation. Hemoglobin levels are suboptimal, possibly due to insufficient erythropoietin  production by the kidneys and regurgitation-related hemolysis. Blood work will be repeated to assess hemoglobin levels and erythropoiesis. Monitoring for symptoms of anemia such as dyspnea and lightheadedness is advised.

## 2023-08-14 NOTE — Assessment & Plan Note (Signed)
 Continue statin/antiplatelets.

## 2023-08-14 NOTE — Progress Notes (Signed)
 ==============================  [facility]: [phone]   -- Medical Office Visit --  Patient: Michael Bender      Age: 78 y.o.       Sex:  male  Date:   08/14/2023 Today's Healthcare Provider: Bernardino KANDICE Cone, MD  ==============================   Chief Complaint: Hyperlipidemia and Hypertension  Discussed the use of AI scribe software for clinical note transcription with the patient, who gave verbal consent to proceed.  History of Present Illness  78 year old male with aortic valve regurgitation and chronic kidney disease who presents for follow-up on cholesterol and blood pressure management, and chronic severe back pain with difficulty getting re-established with pain management (specialist) to resume chronic opioid management.  He reports no dizziness, swelling, or orthopnea. He has a history of heart issues, which have improved recently following hospitalization. He continues to have frequent cardiology and thoracic surgery appointments.  He has aortic valve regurgitation with past findings of vegetation on the valve, treated with antibiotics. No current symptoms such as shortness of breath or syncope, although regurgitation persists. He is scheduled to see a cardiologist and thoracic surgeon later this month.  He has stage 3B chronic kidney disease for the past seven to eight months with a GFR between 30 and 45. He had an infection prior to the decline in kidney function. He reports dry mouth and increased thirst over the last couple of months, possibly related to elevated BUN levels and dehydration from diuretics.  He has a history of low hemoglobin levels, stable but not normal, with a past iron infusion. No recent fevers reported.  He is taking oxycodone  10 mg around the clock for pain management. He is unsure about bowel movement regularity but has Miralax  available if needed. He describes his neck as tight but does not report significant  low back pain or leg pain, as long as he has the medication.  Hasn't heard from recent referral to pain specialist.  No significant weight loss, maintaining a stable weight of 150 pounds. No issues with sleep.  Lab Results  Component Value Date   GFR 67.73 05/03/2022   GFR 78.99 12/14/2020   GFR 69.77 02/06/2019   GFR 79.23 10/13/2015   GFR 104.35 09/08/2015   GFR 91.16 04/09/2014   EGFR 37 (L) 07/20/2023   EGFR 44 (L) 06/07/2023   EGFR 39 (L) 02/22/2023   EGFR 40 (L) 01/17/2023   EGFR 38 (L) 12/28/2022   Lab Results  Component Value Date   BUN 30 (H) 07/20/2023   BUN 27 (H) 06/29/2023   BUN 32 (H) 06/21/2023   BUN 23 06/07/2023   BUN 12 05/19/2023    Lab Results  Component Value Date/Time   HGB 9.7 (L) 07/21/2023 11:14 AM   HGB 10.2 (L) 06/29/2023 11:26 AM   HGB 10.2 (L) 06/21/2023 10:06 AM   HGB 9.0 (L) 05/01/2023 11:04 AM   HGB 8.7 (L) 04/21/2023 02:58 AM   HGB 8.7 (L) 04/20/2023 09:48 AM   Lab Results  Component Value Date/Time   FERRITIN 268 06/29/2023 11:26 AM   FERRITIN 92 05/01/2023 11:04 AM   FERRITIN 224 10/30/2022 02:27 AM   FERRITIN 26.6 11/13/2019 10:49 AM   FERRITIN 68.2 08/06/2019 03:44 PM     Lab Results  Component Value Date   TSH 2.810 06/29/2023   Lipid Panel     Component Value Date/Time   CHOL 93 05/03/2022 1109   TRIG 119.0 05/03/2022 1109   HDL  56.30 05/03/2022 1109   CHOLHDL 2 05/03/2022 1109   VLDL 23.8 05/03/2022 1109   LDLCALC 13 05/03/2022 1109   LDLDIRECT 102.0 09/08/2015 1343    Problem List: has GERD; CONSTIPATION, CHRONIC; Primary hypertension; Bilateral leg pain; Esophageal dysphagia; Insomnia; Peripheral arterial disease (HCC); History of CVA (cerebrovascular accident); Anemia; HLD (hyperlipidemia); COPD (chronic obstructive pulmonary disease) (HCC); Iron deficiency anemia; Duodenal stenosis; Opioid dependence (HCC); Atherosclerosis of aorta (HCC); High risk medication use; Gynecomastia, male; Cachexia (HCC); Chronic low  back pain with bilateral sciatica; Neck pain; Memory loss; Frequent falls; Nonintractable headache; Balance problems; Chronic heart failure with preserved ejection fraction (HFpEF, >= 50%) (HCC); Orthostatic hypotension; Stage 3 chronic kidney disease (HCC); Elevated BUN; and Aortic valve insufficiency due to infection on their problem list. Past Medical History:  has a past medical history of Acute combined systolic and diastolic heart failure (HCC) (96/77/7974), Acute on chronic diastolic CHF (congestive heart failure) (HCC) (04/20/2023), AKI (acute kidney injury) (HCC) (10/28/2022), Arthritis, Balance problems (02/21/2023), Blood transfusion, Cervical discitis (11/10/2022), Chronic back pain, COPD (chronic obstructive pulmonary disease) (HCC), Disturbance of skin sensation (05/22/2018), Effusion of right knee (02/21/2023), Effusion of right knee joint (01/16/2023), Elevated troponin (04/22/2023), Essential hypertension (03/05/2007), Finger pain, left (11/08/2022), Gastric erosion, Gastritis and gastroduodenitis, Gastrointestinal hemorrhage with melena (11/20/2018), GIB (gastrointestinal bleeding) (07/18/2019), Hardware complicating wound infection (HCC) (11/10/2022), Headache(784.0), Hemorrhoids, internal, High risk medication use (03/03/2022), History of duodenal ulcer, History of fusion of cervical spine (03/03/2022), History of lumbar fusion (03/03/2022), History of upper gastrointestinal bleeding (03/03/2022), HLD (hyperlipidemia), Hypertension, Hypokalemia (04/22/2023), Hypomagnesemia (04/22/2023), Infected blister of left index finger (11/07/2022), Intractable pain (03/03/2022), Loss of weight, MSSA bacteremia (11/07/2022), Neck rigidity, Nocturia, PAIN, CHRONIC NEC (10/06/2006), Pneumonia, Prostate disease, Right sided temporal headache (05/22/2018), S/P cervical spinal fusion (03/03/2022), Senile ecchymosis (01/21/2019), Septic arthritis of wrist, left (HCC) (11/08/2022), Septic infrapatellar bursitis of  right knee (11/07/2022), Staphylococcal arthritis of left wrist (HCC) (11/07/2022), Staphylococcal arthritis of right knee (HCC) (11/08/2022), Syncope (11/05/2022), and Underweight on examination (05/03/2022). Past Surgical History:   has a past surgical history that includes Cervical fusion (1992); Spinal fusion (05/06/11); Colonoscopy; Esophagogastroduodenoscopy (07/20/2011); Savory dilation (07/20/2011); biopsy (07/19/2019); Esophagogastroduodenoscopy (egd) with propofol  (N/A, 07/19/2019); I & D extremity (Left, 11/09/2022); and TRANSESOPHAGEAL ECHOCARDIOGRAM (N/A, 07/24/2023). Social History:   reports that he quit smoking about 4 years ago. His smoking use included cigarettes. He started smoking about 44 years ago. He has never used smokeless tobacco. He reports that he does not currently use alcohol after a past usage of about 2.0 standard drinks of alcohol per week. He reports that he does not use drugs. Family History:  family history includes Heart attack (age of onset: 72) in his mother; Heart disease in his mother; Pancreatic cancer in his brother; Pancreatic cancer (age of onset: 71) in his father. Allergies:  is allergic to nubain [nalbuphine hcl].   Medication Reconciliation: Current Outpatient Medications on File Prior to Visit  Medication Sig   amitriptyline  (ELAVIL ) 50 MG tablet Take 1 tablet (50 mg total) by mouth at bedtime.   amLODipine  (NORVASC ) 5 MG tablet Take 1 tablet (5 mg total) by mouth daily.   bisoprolol  (ZEBETA ) 5 MG tablet Take 1 tablet (5 mg total) by mouth daily.   cefadroxil  (DURICEF) 500 MG capsule Take 500 mg by mouth 2 (two) times daily.   cyclobenzaprine  (FLEXERIL ) 10 MG tablet Take 1 tablet (10 mg total) by mouth 3 (three) times daily as needed.   empagliflozin  (JARDIANCE ) 10 MG TABS tablet Take 1  tablet (10 mg total) by mouth daily before breakfast.   FeFum-FePoly-FA-B Cmp-C-Biot (INTEGRA PLUS ) CAPS Take 1 capsule by mouth daily.   hydrALAZINE  (APRESOLINE ) 50 MG tablet  Take 1 tablet (50 mg total) by mouth every 8 (eight) hours.   ipratropium-albuterol  (DUONEB) 0.5-2.5 (3) MG/3ML SOLN Take 3 mLs by nebulization every 4 (four) hours as needed.   isosorbide  mononitrate (IMDUR ) 60 MG 24 hr tablet Take 1 tablet (60 mg total) by mouth daily.   losartan  (COZAAR ) 25 MG tablet Take 25 mg by mouth daily.   pantoprazole  (PROTONIX ) 40 MG tablet Take 1 tablet (40 mg total) by mouth daily.   polyethylene glycol (MIRALAX  / GLYCOLAX ) 17 g packet Take 17 g by mouth daily as needed for moderate constipation.   Respiratory Therapy Supplies (NEBULIZER/TUBING/MOUTHPIECE) KIT 1 each by Does not apply route every 4 (four) hours as needed.   Respiratory Therapy Supplies (NEBULIZER/TUBING/MOUTHPIECE) KIT 1 each by Does not apply route every 4 (four) hours as needed.   rosuvastatin  (CRESTOR ) 40 MG tablet TAKE 1 TABLET BY MOUTH DAILY. REPLACES ATORVASTATIN  (STOP ATORVASTATIN  IF STILL TAKING)   sacubitril-valsartan (ENTRESTO ) 49-51 MG Take 1 tablet by mouth 2 (two) times daily.   spironolactone  (ALDACTONE ) 25 MG tablet Take 12.5 mg by mouth daily.   SUMAtriptan  (IMITREX ) 50 MG tablet TAKE 1 TABLET (50 MG TOTAL) BY MOUTH DAILY. MAY REPEAT IN 2 HOURS IF HEADACHE PERSISTS OR RECURS.   doxycycline  (VIBRA -TABS) 100 MG tablet Take 1 tablet (100 mg total) by mouth 2 (two) times daily. (Patient not taking: Reported on 08/14/2023)   No current facility-administered medications on file prior to visit.   Medications Discontinued During This Encounter  Medication Reason   Oxycodone  HCl 10 MG TABS Reorder     Physical Exam:    08/14/2023    9:44 AM 08/03/2023    2:52 PM 07/24/2023   10:45 AM  Vitals with BMI  Height 5' 11    Weight 150 lbs 10 oz 144 lbs   BMI 21.01 20.09   Systolic 122 159 862  Diastolic 60 46 65  Pulse 61 96 45  Vital signs reviewed.  Nursing notes reviewed. Weight trend reviewed. Physical Exam General Appearance:  No acute distress appreciable.   Well-groomed,  healthy-appearing male.  Well proportioned with no abnormal fat distribution.  Good muscle tone. Pulmonary:  Normal work of breathing at rest, no respiratory distress apparent. SpO2: 98 %  Musculoskeletal: All extremities are intact.  Neurological:  Awake, alert, oriented, and engaged.  No obvious focal neurological deficits or cognitive impairments.  Sensorium seems unclouded.   Speech is clear and coherent with logical content. Psychiatric:  Appropriate mood, pleasant and cooperative demeanor, thoughtful and engaged during the exam Physical Exam MEASUREMENTS: Weight- 150.   Results:    04/19/2023   10:47 AM 03/22/2023    9:40 AM 02/17/2023   11:42 AM 01/17/2023   10:13 AM  PHQ 2/9 Scores  PHQ - 2 Score 0 0 2 0  PHQ- 9 Score  0 3    Results LABS BUN: elevated Hemoglobin: stable but not back to normal GFR: 30-45 (07/20/2023)  RADIOLOGY Aortic valve assessment: severe valve damage with regurgitation  DIAGNOSTIC Transesophageal echocardiogram (TEE): vegetation on valve, no active growth    No results found for any visits on 08/14/23. Admission on 07/24/2023, Discharged on 07/24/2023  Component Date Value Ref Range Status   Est EF 07/24/2023 50 - 55%   Final   Specimen Description 07/24/2023 BLOOD LEFT ARM  Final   Special Requests 07/24/2023 BOTTLES DRAWN AEROBIC AND ANAEROBIC Blood Culture adequate volume   Final   Culture 07/24/2023    Final                   Value:NO GROWTH 5 DAYS Performed at [facility], [address] [place], [place] [address]    Report Status 07/24/2023 07/29/2023 FINAL   Final   Specimen Description 07/24/2023 BLOOD RIGHT ARM   Final   Special Requests 07/24/2023 BOTTLES DRAWN AEROBIC AND ANAEROBIC Blood Culture adequate volume   Final   Culture 07/24/2023    Final                   Value:NO GROWTH 5 DAYS Performed at [facility], [address] [place], [place] [address]    Report Status 07/24/2023 07/29/2023 FINAL   Final   Lab on 07/21/2023  Component Date Value Ref Range Status   WBC 07/21/2023 4.6  4.0 - 10.5 K/uL Final   RBC 07/21/2023 3.25 (L)  4.22 - 5.81 Mil/uL Final   Hemoglobin 07/21/2023 9.7 (L)  13.0 - 17.0 g/dL Final   HCT 93/79/7974 28.8 (L)  39.0 - 52.0 % Final   MCV 07/21/2023 88.8  78.0 - 100.0 fl Final   MCHC 07/21/2023 33.7  30.0 - 36.0 g/dL Final   RDW 93/79/7974 14.6  11.5 - 15.5 % Final   Platelets 07/21/2023 129.0 (L)  150.0 - 400.0 K/uL Final   Neutrophils Relative % 07/21/2023 58.9  43.0 - 77.0 % Final   Lymphocytes Relative 07/21/2023 23.3  12.0 - 46.0 % Final   Monocytes Relative 07/21/2023 8.4  3.0 - 12.0 % Final   Eosinophils Relative 07/21/2023 9.0 (H)  0.0 - 5.0 % Final   Basophils Relative 07/21/2023 0.4  0.0 - 3.0 % Final   Neutro Abs 07/21/2023 2.7  1.4 - 7.7 K/uL Final   Lymphs Abs 07/21/2023 1.1  0.7 - 4.0 K/uL Final   Monocytes Absolute 07/21/2023 0.4  0.1 - 1.0 K/uL Final   Eosinophils Absolute 07/21/2023 0.4  0.0 - 0.7 K/uL Final   Basophils Absolute 07/21/2023 0.0  0.0 - 0.1 K/uL Final  Office Visit on 07/20/2023  Component Date Value Ref Range Status   Glucose 07/20/2023 119 (H)  70 - 99 mg/dL Final   BUN 93/80/7974 30 (H)  8 - 27 mg/dL Final   Creatinine, Ser 07/20/2023 1.85 (H)  0.76 - 1.27 mg/dL Final   eGFR 93/80/7974 37 (L)  >59 mL/min/1.73 Final   BUN/Creatinine Ratio 07/20/2023 16  10 - 24 Final   Sodium 07/20/2023 135  134 - 144 mmol/L Final   Potassium 07/20/2023 5.2  3.5 - 5.2 mmol/L Final   Chloride 07/20/2023 100  96 - 106 mmol/L Final   CO2 07/20/2023 21  20 - 29 mmol/L Final   Calcium  07/20/2023 8.4 (L)  8.6 - 10.2 mg/dL Final  Office Visit on 07/14/2023  Component Date Value Ref Range Status   Amphetamines, Urine 07/21/2023 Negative  Cutoff=1000 ng/mL Final   Cannabinoid Quant, Ur 07/21/2023 Negative  Cutoff=50 ng/mL Final   Cocaine (Metab.) 07/21/2023 Negative  Cutoff=300 ng/mL Final   OPIATE QUANTITATIVE URINE 07/21/2023 Negative   Cutoff=2000 ng/mL Final   PCP Quant, Ur 07/21/2023 Negative  Cutoff=25 ng/mL Final  Appointment on 06/29/2023  Component Date Value Ref Range Status   TSH 06/29/2023 2.810  0.350 - 4.500 uIU/mL Final   Total Protein ELP 06/29/2023  6.8  6.0 - 8.5 g/dL Final   Albumin ELP 94/70/7974 3.3  2.9 - 4.4 g/dL Final   Joeyj-8-Honalopw 06/29/2023 0.3  0.0 - 0.4 g/dL Final   Joeyj-7-Honalopw 06/29/2023 0.8  0.4 - 1.0 g/dL Final   Beta Globulin 94/70/7974 1.0  0.7 - 1.3 g/dL Final   Gamma Globulin 06/29/2023 1.4  0.4 - 1.8 g/dL Final   M-Spike, % 94/70/7974 Not Observed  Not Observed g/dL Final   Globulin, Total 06/29/2023 3.5  2.2 - 3.9 g/dL Corrected   A/G Ratio 94/70/7974 0.9  0.7 - 1.7 Corrected   Comment 06/29/2023 Comment   Corrected   SPEP Interpretation 06/29/2023 Comment   Final   Vitamin B-12 06/29/2023 245  180 - 914 pg/mL Final   Folate 06/29/2023 13.6  >5.9 ng/mL Final   LDH 06/29/2023 209 (H)  98 - 192 U/L Final   Iron 06/29/2023 71  45 - 182 ug/dL Final   TIBC 94/70/7974 259  250 - 450 ug/dL Final   Saturation Ratios 06/29/2023 27  17.9 - 39.5 % Final   UIBC 06/29/2023 188  117 - 376 ug/dL Final   Ferritin 94/70/7974 268  24 - 336 ng/mL Final   Sodium 06/29/2023 135  135 - 145 mmol/L Final   Potassium 06/29/2023 4.2  3.5 - 5.1 mmol/L Final   Chloride 06/29/2023 102  98 - 111 mmol/L Final   CO2 06/29/2023 26  22 - 32 mmol/L Final   Glucose, Bld 06/29/2023 155 (H)  70 - 99 mg/dL Final   BUN 94/70/7974 27 (H)  8 - 23 mg/dL Final   Creatinine 94/70/7974 1.77 (H)  0.61 - 1.24 mg/dL Final   Calcium  06/29/2023 8.9  8.9 - 10.3 mg/dL Final   Total Protein 94/70/7974 7.4  6.5 - 8.1 g/dL Final   Albumin 94/70/7974 4.0  3.5 - 5.0 g/dL Final   AST 94/70/7974 26  15 - 41 U/L Final   ALT 06/29/2023 10  0 - 44 U/L Final   Alkaline Phosphatase 06/29/2023 110  38 - 126 U/L Final   Total Bilirubin 06/29/2023 0.4  0.0 - 1.2 mg/dL Final   GFR, Estimated 06/29/2023 39 (L)  >60 mL/min Final   Anion  gap 06/29/2023 7  5 - 15 Final   WBC Count 06/29/2023 6.6  4.0 - 10.5 K/uL Final   RBC 06/29/2023 3.50 (L)  4.22 - 5.81 MIL/uL Final   Hemoglobin 06/29/2023 10.2 (L)  13.0 - 17.0 g/dL Final   HCT 94/70/7974 31.0 (L)  39.0 - 52.0 % Final   MCV 06/29/2023 88.6  80.0 - 100.0 fL Final   MCH 06/29/2023 29.1  26.0 - 34.0 pg Final   MCHC 06/29/2023 32.9  30.0 - 36.0 g/dL Final   RDW 94/70/7974 14.2  11.5 - 15.5 % Final   Platelet Count 06/29/2023 165  150 - 400 K/uL Final   nRBC 06/29/2023 0.0  0.0 - 0.2 % Final   Neutrophils Relative % 06/29/2023 59  % Final   Neutro Abs 06/29/2023 4.0  1.7 - 7.7 K/uL Final   Lymphocytes Relative 06/29/2023 23  % Final   Lymphs Abs 06/29/2023 1.5  0.7 - 4.0 K/uL Final   Monocytes Relative 06/29/2023 7  % Final   Monocytes Absolute 06/29/2023 0.4  0.1 - 1.0 K/uL Final   Eosinophils Relative 06/29/2023 10  % Final   Eosinophils Absolute 06/29/2023 0.7 (H)  0.0 - 0.5 K/uL Final   Basophils Relative 06/29/2023 1  % Final  Basophils Absolute 06/29/2023 0.0  0.0 - 0.1 K/uL Final   Immature Granulocytes 06/29/2023 0  % Final   Abs Immature Granulocytes 06/29/2023 0.01  0.00 - 0.07 K/uL Final  Lab on 06/21/2023  Component Date Value Ref Range Status   WBC 06/21/2023 6.0  4.0 - 10.5 K/uL Final   RBC 06/21/2023 3.44 (L)  4.22 - 5.81 Mil/uL Final   Hemoglobin 06/21/2023 10.2 (L)  13.0 - 17.0 g/dL Final   HCT 94/78/7974 30.4 (L)  39.0 - 52.0 % Final   MCV 06/21/2023 88.4  78.0 - 100.0 fl Final   MCHC 06/21/2023 33.6  30.0 - 36.0 g/dL Final   RDW 94/78/7974 15.2  11.5 - 15.5 % Final   Platelets 06/21/2023 178.0  150.0 - 400.0 K/uL Final   Neutrophils Relative % 06/21/2023 46.5  43.0 - 77.0 % Final   Lymphocytes Relative 06/21/2023 29.5  12.0 - 46.0 % Final   Monocytes Relative 06/21/2023 8.8  3.0 - 12.0 % Final   Eosinophils Relative 06/21/2023 14.6 (H)  0.0 - 5.0 % Final   Basophils Relative 06/21/2023 0.6  0.0 - 3.0 % Final   Neutro Abs 06/21/2023 2.8  1.4 -  7.7 K/uL Final   Lymphs Abs 06/21/2023 1.8  0.7 - 4.0 K/uL Final   Monocytes Absolute 06/21/2023 0.5  0.1 - 1.0 K/uL Final   Eosinophils Absolute 06/21/2023 0.9 (H)  0.0 - 0.7 K/uL Final   Basophils Absolute 06/21/2023 0.0  0.0 - 0.1 K/uL Final   Glucose, Bld 06/21/2023 98  65 - 99 mg/dL Final   BUN 94/78/7974 32 (H)  7 - 25 mg/dL Final   Creat 94/78/7974 1.87 (H)  0.70 - 1.28 mg/dL Final   BUN/Creatinine Ratio 06/21/2023 17  6 - 22 (calc) Final   Sodium 06/21/2023 133 (L)  135 - 146 mmol/L Final   Potassium 06/21/2023 4.0  3.5 - 5.3 mmol/L Final   Chloride 06/21/2023 101  98 - 110 mmol/L Final   CO2 06/21/2023 22  20 - 32 mmol/L Final   Calcium  06/21/2023 8.8  8.6 - 10.3 mg/dL Final   Total Protein 94/78/7974 7.0  6.1 - 8.1 g/dL Final   Albumin 94/78/7974 3.6  3.6 - 5.1 g/dL Final   Globulin 94/78/7974 3.4  1.9 - 3.7 g/dL (calc) Final   AG Ratio 06/21/2023 1.1  1.0 - 2.5 (calc) Final   Total Bilirubin 06/21/2023 0.3  0.2 - 1.2 mg/dL Final   Alkaline phosphatase (APISO) 06/21/2023 82  35 - 144 U/L Final   AST 06/21/2023 20  10 - 35 U/L Final   ALT 06/21/2023 6 (L)  9 - 46 U/L Final  Office Visit on 06/07/2023  Component Date Value Ref Range Status   Glucose 06/07/2023 104 (H)  70 - 99 mg/dL Final   BUN 94/92/7974 23  8 - 27 mg/dL Final   Creatinine, Ser 06/07/2023 1.60 (H)  0.76 - 1.27 mg/dL Final   eGFR 94/92/7974 44 (L)  >59 mL/min/1.73 Final   BUN/Creatinine Ratio 06/07/2023 14  10 - 24 Final   Sodium 06/07/2023 140  134 - 144 mmol/L Final   Potassium 06/07/2023 3.9  3.5 - 5.2 mmol/L Final   Chloride 06/07/2023 104  96 - 106 mmol/L Final   CO2 06/07/2023 21  20 - 29 mmol/L Final   Calcium  06/07/2023 8.6  8.6 - 10.2 mg/dL Final  Hospital Outpatient Visit on 05/19/2023  Component Date Value Ref Range Status   Sodium 05/19/2023 136  135 - 145 mmol/L Final   Potassium 05/19/2023 3.4 (L)  3.5 - 5.1 mmol/L Final   Chloride 05/19/2023 103  98 - 111 mmol/L Final   CO2 05/19/2023 24   22 - 32 mmol/L Final   Glucose, Bld 05/19/2023 125 (H)  70 - 99 mg/dL Final   BUN 95/81/7974 12  8 - 23 mg/dL Final   Creatinine, Ser 05/19/2023 1.21  0.61 - 1.24 mg/dL Final   Calcium  05/19/2023 8.4 (L)  8.9 - 10.3 mg/dL Final   GFR, Estimated 05/19/2023 >60  >60 mL/min Final   Anion gap 05/19/2023 9  5 - 15 Final   B Natriuretic Peptide 05/19/2023 669.2 (H)  0.0 - 100.0 pg/mL Final  Hospital Outpatient Visit on 05/01/2023  Component Date Value Ref Range Status   Sodium 05/01/2023 139  135 - 145 mmol/L Final   Potassium 05/01/2023 3.6  3.5 - 5.1 mmol/L Final   Chloride 05/01/2023 108  98 - 111 mmol/L Final   CO2 05/01/2023 25  22 - 32 mmol/L Final   Glucose, Bld 05/01/2023 123 (H)  70 - 99 mg/dL Final   BUN 96/68/7974 14  8 - 23 mg/dL Final   Creatinine, Ser 05/01/2023 1.30 (H)  0.61 - 1.24 mg/dL Final   Calcium  05/01/2023 8.5 (L)  8.9 - 10.3 mg/dL Final   GFR, Estimated 05/01/2023 56 (L)  >60 mL/min Final   Anion gap 05/01/2023 6  5 - 15 Final   B Natriuretic Peptide 05/01/2023 491.3 (H)  0.0 - 100.0 pg/mL Final   WBC 05/01/2023 6.0  4.0 - 10.5 K/uL Final   RBC 05/01/2023 3.04 (L)  4.22 - 5.81 MIL/uL Final   Hemoglobin 05/01/2023 9.0 (L)  13.0 - 17.0 g/dL Final   HCT 96/68/7974 28.2 (L)  39.0 - 52.0 % Final   MCV 05/01/2023 92.8  80.0 - 100.0 fL Final   MCH 05/01/2023 29.6  26.0 - 34.0 pg Final   MCHC 05/01/2023 31.9  30.0 - 36.0 g/dL Final   RDW 96/68/7974 13.7  11.5 - 15.5 % Final   Platelets 05/01/2023 216  150 - 400 K/uL Final   nRBC 05/01/2023 0.0  0.0 - 0.2 % Final   Iron 05/01/2023 42 (L)  45 - 182 ug/dL Final   TIBC 96/68/7974 309  250 - 450 ug/dL Final   Saturation Ratios 05/01/2023 14 (L)  17.9 - 39.5 % Final   UIBC 05/01/2023 267  ug/dL Final   Ferritin 96/68/7974 92  24 - 336 ng/mL Final  Admission on 04/20/2023, Discharged on 04/24/2023  Component Date Value Ref Range Status   WBC 04/20/2023 6.6  4.0 - 10.5 K/uL Final   RBC 04/20/2023 2.89 (L)  4.22 - 5.81 MIL/uL  Final   Hemoglobin 04/20/2023 8.7 (L)  13.0 - 17.0 g/dL Final   HCT 96/79/7974 26.3 (L)  39.0 - 52.0 % Final   MCV 04/20/2023 91.0  80.0 - 100.0 fL Final   MCH 04/20/2023 30.1  26.0 - 34.0 pg Final   MCHC 04/20/2023 33.1  30.0 - 36.0 g/dL Final   RDW 96/79/7974 14.2  11.5 - 15.5 % Final   Platelets 04/20/2023 224  150 - 400 K/uL Final   nRBC 04/20/2023 0.0  0.0 - 0.2 % Final   Neutrophils Relative % 04/20/2023 77  % Final   Neutro Abs 04/20/2023 5.1  1.7 - 7.7 K/uL Final   Lymphocytes Relative 04/20/2023 12  % Final   Lymphs Abs 04/20/2023 0.8  0.7 -  4.0 K/uL Final   Monocytes Relative 04/20/2023 8  % Final   Monocytes Absolute 04/20/2023 0.5  0.1 - 1.0 K/uL Final   Eosinophils Relative 04/20/2023 2  % Final   Eosinophils Absolute 04/20/2023 0.2  0.0 - 0.5 K/uL Final   Basophils Relative 04/20/2023 1  % Final   Basophils Absolute 04/20/2023 0.0  0.0 - 0.1 K/uL Final   Immature Granulocytes 04/20/2023 0  % Final   Abs Immature Granulocytes 04/20/2023 0.02  0.00 - 0.07 K/uL Final   Sodium 04/20/2023 137  135 - 145 mmol/L Final   Potassium 04/20/2023 2.8 (L)  3.5 - 5.1 mmol/L Final   Chloride 04/20/2023 102  98 - 111 mmol/L Final   CO2 04/20/2023 27  22 - 32 mmol/L Final   Glucose, Bld 04/20/2023 135 (H)  70 - 99 mg/dL Final   BUN 96/79/7974 17  8 - 23 mg/dL Final   Creatinine, Ser 04/20/2023 1.39 (H)  0.61 - 1.24 mg/dL Final   Calcium  04/20/2023 8.1 (L)  8.9 - 10.3 mg/dL Final   GFR, Estimated 04/20/2023 52 (L)  >60 mL/min Final   Anion gap 04/20/2023 8  5 - 15 Final   B Natriuretic Peptide 04/20/2023 2,352.1 (H)  0.0 - 100.0 pg/mL Final   Troponin I (High Sensitivity) 04/20/2023 79 (H)  <18 ng/L Final   SARS Coronavirus 2 by RT PCR 04/20/2023 NEGATIVE  NEGATIVE Final   Influenza A by PCR 04/20/2023 NEGATIVE  NEGATIVE Final   Influenza B by PCR 04/20/2023 NEGATIVE  NEGATIVE Final   Resp Syncytial Virus by PCR 04/20/2023 NEGATIVE  NEGATIVE Final   Magnesium  04/20/2023 1.9  1.7 - 2.4  mg/dL Final   Troponin I (High Sensitivity) 04/20/2023 72 (H)  <18 ng/L Final   Sodium 04/21/2023 140  135 - 145 mmol/L Final   Potassium 04/21/2023 3.3 (L)  3.5 - 5.1 mmol/L Final   Chloride 04/21/2023 105  98 - 111 mmol/L Final   CO2 04/21/2023 27  22 - 32 mmol/L Final   Glucose, Bld 04/21/2023 111 (H)  70 - 99 mg/dL Final   BUN 96/78/7974 17  8 - 23 mg/dL Final   Creatinine, Ser 04/21/2023 1.56 (H)  0.61 - 1.24 mg/dL Final   Calcium  04/21/2023 8.2 (L)  8.9 - 10.3 mg/dL Final   Total Protein 96/78/7974 6.0 (L)  6.5 - 8.1 g/dL Final   Albumin 96/78/7974 2.6 (L)  3.5 - 5.0 g/dL Final   AST 96/78/7974 19  15 - 41 U/L Final   ALT 04/21/2023 7  0 - 44 U/L Final   Alkaline Phosphatase 04/21/2023 84  38 - 126 U/L Final   Total Bilirubin 04/21/2023 0.5  0.0 - 1.2 mg/dL Final   GFR, Estimated 04/21/2023 45 (L)  >60 mL/min Final   Anion gap 04/21/2023 8  5 - 15 Final   WBC 04/21/2023 5.8  4.0 - 10.5 K/uL Final   RBC 04/21/2023 2.92 (L)  4.22 - 5.81 MIL/uL Final   Hemoglobin 04/21/2023 8.7 (L)  13.0 - 17.0 g/dL Final   HCT 96/78/7974 26.6 (L)  39.0 - 52.0 % Final   MCV 04/21/2023 91.1  80.0 - 100.0 fL Final   MCH 04/21/2023 29.8  26.0 - 34.0 pg Final   MCHC 04/21/2023 32.7  30.0 - 36.0 g/dL Final   RDW 96/78/7974 14.3  11.5 - 15.5 % Final   Platelets 04/21/2023 225  150 - 400 K/uL Final   nRBC 04/21/2023 0.0  0.0 -  0.2 % Final   Magnesium  04/21/2023 1.9  1.7 - 2.4 mg/dL Final   Weight 96/78/7974 2,303.37  oz Final   Height 04/21/2023 71  in Final   BP 04/21/2023 168/64  mmHg Final   Single Plane A2C EF 04/21/2023 36.9  % Final   Single Plane A4C EF 04/21/2023 37.7  % Final   Calc EF 04/21/2023 36.1  % Final   S' Lateral 04/21/2023 4.00  cm Final   AR max vel 04/21/2023 2.15  cm2 Final   AV Area VTI 04/21/2023 2.32  cm2 Final   AV Mean grad 04/21/2023 4.0  mmHg Final   AV Peak grad 04/21/2023 7.2  mmHg Final   Ao pk vel 04/21/2023 1.34  m/s Final   Area-P 1/2 04/21/2023 4.96  cm2 Final    AV Area mean vel 04/21/2023 2.02  cm2 Final   MV VTI 04/21/2023 2.63  cm2 Final   Est EF 04/21/2023 40 - 45%   Final   Sodium 04/22/2023 140  135 - 145 mmol/L Final   Potassium 04/22/2023 3.2 (L)  3.5 - 5.1 mmol/L Final   Chloride 04/22/2023 101  98 - 111 mmol/L Final   CO2 04/22/2023 28  22 - 32 mmol/L Final   Glucose, Bld 04/22/2023 115 (H)  70 - 99 mg/dL Final   BUN 96/77/7974 19  8 - 23 mg/dL Final   Creatinine, Ser 04/22/2023 1.72 (H)  0.61 - 1.24 mg/dL Final   Calcium  04/22/2023 8.6 (L)  8.9 - 10.3 mg/dL Final   GFR, Estimated 04/22/2023 40 (L)  >60 mL/min Final   Anion gap 04/22/2023 11  5 - 15 Final   Magnesium  04/22/2023 1.8  1.7 - 2.4 mg/dL Final   Sodium 96/76/7974 139  135 - 145 mmol/L Final   Potassium 04/23/2023 4.1  3.5 - 5.1 mmol/L Final   Chloride 04/23/2023 104  98 - 111 mmol/L Final   CO2 04/23/2023 27  22 - 32 mmol/L Final   Glucose, Bld 04/23/2023 139 (H)  70 - 99 mg/dL Final   BUN 96/76/7974 25 (H)  8 - 23 mg/dL Final   Creatinine, Ser 04/23/2023 1.72 (H)  0.61 - 1.24 mg/dL Final   Calcium  04/23/2023 8.5 (L)  8.9 - 10.3 mg/dL Final   GFR, Estimated 04/23/2023 40 (L)  >60 mL/min Final   Anion gap 04/23/2023 8  5 - 15 Final   Magnesium  04/23/2023 1.9  1.7 - 2.4 mg/dL Final   Sodium 96/75/7974 138  135 - 145 mmol/L Final   Potassium 04/24/2023 3.9  3.5 - 5.1 mmol/L Final   Chloride 04/24/2023 104  98 - 111 mmol/L Final   CO2 04/24/2023 26  22 - 32 mmol/L Final   Glucose, Bld 04/24/2023 107 (H)  70 - 99 mg/dL Final   BUN 96/75/7974 21  8 - 23 mg/dL Final   Creatinine, Ser 04/24/2023 1.77 (H)  0.61 - 1.24 mg/dL Final   Calcium  04/24/2023 8.3 (L)  8.9 - 10.3 mg/dL Final   GFR, Estimated 04/24/2023 39 (L)  >60 mL/min Final   Anion gap 04/24/2023 8  5 - 15 Final   Magnesium  04/24/2023 2.0  1.7 - 2.4 mg/dL Final  There may be more visits with results that are not included.  No image results found. ECHO TEE Result Date: 07/24/2023    TRANSESOPHOGEAL ECHO REPORT    Patient Name:   Michael Bender Date of Exam: 07/24/2023 Medical Rec #:  [id number]  Height:       71.0 in Accession #:    [id number]       Weight:       150.0 lb Date of Birth:  [birth year]       BSA:          1.866 m Patient Age:    77 years         BP:           142/44 mmHg Patient Gender: M                HR:           50 bpm. Exam Location:  Outpatient Procedure: Transesophageal Echo, Cardiac Doppler, Color Doppler and 3D Echo            (Both Spectral and Color Flow Doppler were utilized during            procedure). Indications:     aortic insufficiency  History:         Patient has prior history of Echocardiogram examinations, most                  recent 04/21/2023. Risk Factors:Hypertension.  Sonographer:     Philomena Daring Sonographer#2:   Tinnie Barefoot RDCS Referring Phys:  [id number] MADISON L [place] Diagnosing Phys: Redell Shallow MD PROCEDURE: After discussion of the risks and benefits of a TEE, an informed consent was obtained from the patient. The transesophogeal probe was passed without difficulty through the esophogus of the patient. Imaged were obtained with the patient in a left lateral decubitus position. Sedation performed by different physician. The patient was monitored while under deep sedation. Anesthestetic sedation was provided intravenously by Anesthesiology: 300mg  of Propofol . Image quality was excellent. The patient developed no complications during the procedure.  IMPRESSIONS  1. Oscillating, linear density (0.7-0.8 cm) on left coronary cusp of aortic valve concerning for vegetation; severe AI (best seen on transgastric views).  2. Left ventricular ejection fraction, by estimation, is 50 to 55%. The left ventricle has low normal function. The left ventricle has no regional wall motion abnormalities. The left ventricular internal cavity size was mildly dilated.  3. Right ventricular systolic function is mildly reduced. The right ventricular size is normal.  4. Left atrial size  was moderately dilated. No left atrial/left atrial appendage thrombus was detected.  5. The mitral valve is normal in structure. Mild mitral valve regurgitation.  6. The aortic valve is tricuspid. Aortic valve regurgitation is severe.  7. There is Moderate (Grade III) plaque involving the descending aorta.  8. 3D performed of the aortic valve. FINDINGS  Left Ventricle: Left ventricular ejection fraction, by estimation, is 50 to 55%. The left ventricle has low normal function. The left ventricle has no regional wall motion abnormalities. The left ventricular internal cavity size was mildly dilated. Right Ventricle: The right ventricular size is normal. Right ventricular systolic function is mildly reduced. Left Atrium: Left atrial size was moderately dilated. No left atrial/left atrial appendage thrombus was detected. Right Atrium: Right atrial size was normal in size. Pericardium: There is no evidence of pericardial effusion. Mitral Valve: The mitral valve is normal in structure. Mild mitral valve regurgitation. Tricuspid Valve: The tricuspid valve is normal in structure. Tricuspid valve regurgitation is trivial. Aortic Valve: The aortic valve is tricuspid. Aortic valve regurgitation is severe. Pulmonic Valve: The pulmonic valve was normal in structure. Pulmonic valve regurgitation is not visualized. Aorta: The aortic root is normal in size and structure.  There is moderate (Grade III) plaque involving the descending aorta. IAS/Shunts: No atrial level shunt detected by color flow Doppler. Additional Comments: Oscillating, linear density (0.7-0.8 cm) on left coronary cusp of aortic valve concerning for vegetation; severe AI (best seen on transgastric views).  AORTA Ao Root diam: 3.50 cm Ao Asc diam:  3.80 cm Redell Shallow MD Electronically signed by Redell Shallow MD Signature Date/Time: 07/24/2023/12:17:18 PM    Final    EP STUDY Result Date: 07/24/2023 See surgical note for result.  MR CARDIAC MORPHOLOGY W WO  CONTRAST Result Date: 06/09/2023 CLINICAL DATA:  Clinical question of Heart Failure Study assumes HCT of 28.2  And BSA of 1.89 m2. EXAM: CARDIAC MRI TECHNIQUE: The patient was scanned on a 1.5 Tesla GE magnet. A dedicated cardiac coil was used. Functional imaging was done using Fiesta sequences. 2,3, and 4 chamber views were done to assess for RWMA's. Modified Simpson's rule using was used to calculate an ejection fraction on a dedicated work [employer]. The patient received 10 cc of Gadavist . After 10 minutes inversion recovery sequences were used to assess for infiltration and scar tissue. Flow quantification was performed 2 times during this examination with flow quantification performed at the levels of the ascending aorta above the valve, pulmonary artery above the valve. CONTRAST:  10 cc  of Gadavist  FINDINGS: 1. Moderately dilated left ventricular size, with LVEDD 56 mm, but LVEDVi 120 mL/m2. Mild increase in left ventricular thickness, with intraventricular septal thickness of 11 mm, posterior wall thickness of 8 mm, but myocardial mass index of 96 g/m2. Mild decrease left ventricular systolic function (LVEF =42%). There are no regional wall motion abnormalities but global hypokinesis. Left ventricular parametric mapping notable for ECV elevation in the RV insertion point (44%) and normal T2. ECV elevation can be seen in increased right ventricular filling pressures. There is no late gadolinium enhancement in the left ventricular myocardium. 2. Normal right ventricular size with RVEDVI 77 mL/m2. Normal right ventricular thickness. Normal right ventricular systolic function (RVEF =48%). There are no regional wall motion abnormalities or aneurysms. 3. Normal left and right atrial size. Atrial septal aneurysm. Cannot exclude small PFO. IVC is dilated, 25 mm. 4. Evidence of mild ascending aortic dilation, 43 mm. Moderate main pulmonary artery dilation, 32 mm Normal aortic root. 5. Valve  assessment: Aortic Valve: Qualitatively there are multiple jets of aortic regurgitation. Moderate to severe regurgitation with regurgitant fraction 35%. Gradient 1.8 mm Hg. Pulmonic Valve: Qualitatively, there is no significant regurgitation. Regurgitant fraction 4%. Tricuspid Valve: Qualitatively, there is mild, central regurgitation. Regurgitant fraction 8%. Mitral Valve: Qualitatively, there is mild, central regurgitation. Regurgitant fraction 7%. 6.  Normal pericardium.  Trivial, inferior, pericardial effusion. 7. Grossly, no extracardiac findings. Recommended dedicated study if concerned for non-cardiac pathology. 8.  Breath hold artifacts noted. IMPRESSION: 1. Mild decrease left ventricular systolic function (LVEF =42%). 2. Moderate to severe regurgitation with regurgitant fraction 35% with moderate left ventricular dilation. This is markedly different that color Doppler evaluation on recent echocardiogram. Consider TEE assessment as this MRI is consistent with aortic regurgitation associated left ventricular dysfunction. 3. Evidence of mild ascending aortic dilation, 43 mm. Stanly Leavens MD Electronically Signed   By: Stanly Leavens M.D.   On: 06/09/2023 12:55   MR CARDIAC VELOCITY FLOW MAP Result Date: 06/09/2023 CLINICAL DATA:  Clinical question of Heart Failure Study assumes HCT of 28.2  And BSA of 1.89 m2. EXAM: CARDIAC MRI TECHNIQUE: The patient was scanned on a 1.5  Tesla GE magnet. A dedicated cardiac coil was used. Functional imaging was done using Fiesta sequences. 2,3, and 4 chamber views were done to assess for RWMA's. Modified Simpson's rule using was used to calculate an ejection fraction on a dedicated work [employer], [employer]. The patient received 10 cc of Gadavist . After 10 minutes inversion recovery sequences were used to assess for infiltration and scar tissue. Flow quantification was performed 2 times during this examination with flow quantification performed at  the levels of the ascending aorta above the valve, pulmonary artery above the valve. CONTRAST:  10 cc  of Gadavist  FINDINGS: 1. Moderately dilated left ventricular size, with LVEDD 56 mm, but LVEDVi 120 mL/m2. Mild increase in left ventricular thickness, with intraventricular septal thickness of 11 mm, posterior wall thickness of 8 mm, but myocardial mass index of 96 g/m2. Mild decrease left ventricular systolic function (LVEF =42%). There are no regional wall motion abnormalities but global hypokinesis. Left ventricular parametric mapping notable for ECV elevation in the RV insertion point (44%) and normal T2. ECV elevation can be seen in increased right ventricular filling pressures. There is no late gadolinium enhancement in the left ventricular myocardium. 2. Normal right ventricular size with RVEDVI 77 mL/m2. Normal right ventricular thickness. Normal right ventricular systolic function (RVEF =48%). There are no regional wall motion abnormalities or aneurysms. 3. Normal left and right atrial size. Atrial septal aneurysm. Cannot exclude small PFO. IVC is dilated, 25 mm. 4. Evidence of mild ascending aortic dilation, 43 mm. Moderate main pulmonary artery dilation, 32 mm Normal aortic root. 5. Valve assessment: Aortic Valve: Qualitatively there are multiple jets of aortic regurgitation. Moderate to severe regurgitation with regurgitant fraction 35%. Gradient 1.8 mm Hg. Pulmonic Valve: Qualitatively, there is no significant regurgitation. Regurgitant fraction 4%. Tricuspid Valve: Qualitatively, there is mild, central regurgitation. Regurgitant fraction 8%. Mitral Valve: Qualitatively, there is mild, central regurgitation. Regurgitant fraction 7%. 6.  Normal pericardium.  Trivial, inferior, pericardial effusion. 7. Grossly, no extracardiac findings. Recommended dedicated study if concerned for non-cardiac pathology. 8.  Breath hold artifacts noted. IMPRESSION: 1. Mild decrease left ventricular systolic function  (LVEF =42%). 2. Moderate to severe regurgitation with regurgitant fraction 35% with moderate left ventricular dilation. This is markedly different that color Doppler evaluation on recent echocardiogram. Consider TEE assessment as this MRI is consistent with aortic regurgitation associated left ventricular dysfunction. 3. Evidence of mild ascending aortic dilation, 43 mm. Stanly Leavens MD Electronically Signed   By: Stanly Leavens M.D.   On: 06/09/2023 12:55   MR CARDIAC VELOCITY FLOW MAP Result Date: 06/09/2023 CLINICAL DATA:  Clinical question of Heart Failure Study assumes HCT of 28.2  And BSA of 1.89 m2. EXAM: CARDIAC MRI TECHNIQUE: The patient was scanned on a 1.5 Tesla GE magnet. A dedicated cardiac coil was used. Functional imaging was done using Fiesta sequences. 2,3, and 4 chamber views were done to assess for RWMA's. Modified Simpson's rule using was used to calculate an ejection fraction on a dedicated work [employer], [employer]. The patient received 10 cc of Gadavist . After 10 minutes inversion recovery sequences were used to assess for infiltration and scar tissue. Flow quantification was performed 2 times during this examination with flow quantification performed at the levels of the ascending aorta above the valve, pulmonary artery above the valve. CONTRAST:  10 cc  of Gadavist  FINDINGS: 1. Moderately dilated left ventricular size, with LVEDD 56 mm, but LVEDVi 120 mL/m2. Mild increase in left ventricular thickness, with intraventricular septal  thickness of 11 mm, posterior wall thickness of 8 mm, but myocardial mass index of 96 g/m2. Mild decrease left ventricular systolic function (LVEF =42%). There are no regional wall motion abnormalities but global hypokinesis. Left ventricular parametric mapping notable for ECV elevation in the RV insertion point (44%) and normal T2. ECV elevation can be seen in increased right ventricular filling pressures. There is no late gadolinium  enhancement in the left ventricular myocardium. 2. Normal right ventricular size with RVEDVI 77 mL/m2. Normal right ventricular thickness. Normal right ventricular systolic function (RVEF =48%). There are no regional wall motion abnormalities or aneurysms. 3. Normal left and right atrial size. Atrial septal aneurysm. Cannot exclude small PFO. IVC is dilated, 25 mm. 4. Evidence of mild ascending aortic dilation, 43 mm. Moderate main pulmonary artery dilation, 32 mm Normal aortic root. 5. Valve assessment: Aortic Valve: Qualitatively there are multiple jets of aortic regurgitation. Moderate to severe regurgitation with regurgitant fraction 35%. Gradient 1.8 mm Hg. Pulmonic Valve: Qualitatively, there is no significant regurgitation. Regurgitant fraction 4%. Tricuspid Valve: Qualitatively, there is mild, central regurgitation. Regurgitant fraction 8%. Mitral Valve: Qualitatively, there is mild, central regurgitation. Regurgitant fraction 7%. 6.  Normal pericardium.  Trivial, inferior, pericardial effusion. 7. Grossly, no extracardiac findings. Recommended dedicated study if concerned for non-cardiac pathology. 8.  Breath hold artifacts noted. IMPRESSION: 1. Mild decrease left ventricular systolic function (LVEF =42%). 2. Moderate to severe regurgitation with regurgitant fraction 35% with moderate left ventricular dilation. This is markedly different that color Doppler evaluation on recent echocardiogram. Consider TEE assessment as this MRI is consistent with aortic regurgitation associated left ventricular dysfunction. 3. Evidence of mild ascending aortic dilation, 43 mm. Stanly Leavens MD Electronically Signed   By: Stanly Leavens M.D.   On: 06/09/2023 12:55         ASSESSMENT & PLAN   Assessment & Plan Stage 3b chronic kidney disease (HCC) Chronic kidney disease stage 3B is likely secondary to a previous systemic infection, with GFR between 30-45 for the past 7-8 months. Elevated BUN suggests  possible dehydration, potentially due to diuretic use. The condition is far from requiring dialysis. Renal function will be monitored, and no immediate blood work is needed. Dehydration will be assessed, and diuretic use may be adjusted if necessary. Elevated BUN Since 06/2023, likely related with chronic kidney disease but that's been stable since 2024. ? Medication(s) related? Will order lab testing to guide management.  Anemia, unspecified type Anemia is likely secondary to chronic kidney disease and possible hemolysis from aortic valve regurgitation. Hemoglobin levels are suboptimal, possibly due to insufficient erythropoietin  production by the kidneys and regurgitation-related hemolysis. Blood work will be repeated to assess hemoglobin levels and erythropoiesis. Monitoring for symptoms of anemia such as dyspnea and lightheadedness is advised. Chronic bilateral low back pain with bilateral sciatica Chronic low back pain is managed with oxycodone , providing adequate analgesia at the current dose. There is concern for opioid-induced constipation. Oxycodone  10 mg will be continued as prescribed, and Miralax  will be used as needed for constipation. A referral to pain management specialist Heather is pending; an alternative referral will be considered if there is no response. Neck pain Stiff, borderline controlled with opioids  High risk medication use Son assists with care and medication(s) management.  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  No signs of misuse.   PDMP reviewed during this encounter. Plan to transfer chronic opioid management to pain specialist as soon as logistics issues resolved  Peripheral arterial disease (HCC) Continue statin/antiplatelets. 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 He is asymptomatic, with no dyspnea or syncope, but there is concern for  long-term complications like high output heart failure and hemolysis leading to anemia. Valve repair or replacement is being considered, possibly via a minimally invasive transcatheter approach. Despite his questioning the need for surgery due to lack of symptoms, it is explained that the condition will eventually lead to cardiac deterioration and high output heart failure. The minimally invasive procedure avoids open-heart surgery, which is a significant consideration for him. Repeat blood work will assess erythropoiesis and iron levels. Follow-up appointments with the cardiologist and thoracic surgeon are scheduled for July 29 and July 30, respectively. Chronic obstructive pulmonary disease, unspecified COPD type (HCC)  General health maintenance includes monitoring the lipid profile and ensuring adequate hydration. The lipid profile will be checked during blood work, and adequate hydration will be encouraged to prevent dehydration.    ORDER ASSOCIATIONS  #   DIAGNOSIS / CONDITION ICD-10 ENCOUNTER ORDER     ICD-10-CM   1. Stage 3b chronic kidney disease (HCC)  N18.32 CBC with Differential/Platelet    Comp Met (CMET)    Retic    Iron, TIBC and Ferritin Panel    B12 and Folate Panel    Protein Electrophoresis, (serum)    Lactate dehydrogenase    Haptoglobin    Pathologist smear review    Direct antiglobulin test (not at [facility])    Erythropoietin     Urinalysis w microscopic + reflex cultur    Protein / creatinine ratio, urine    PTH, intact (no Ca)    Phosphorus    2. Elevated BUN  R79.9 Protein Electrophoresis, (serum)    3. Anemia, unspecified type  D64.9 CBC with Differential/Platelet    Comp Met (CMET)    Retic    Iron, TIBC and Ferritin Panel    B12 and Folate Panel    Lactate dehydrogenase    Haptoglobin    Pathologist smear review    Direct antiglobulin test (not at [facility])    Erythropoietin     Urinalysis w microscopic + reflex cultur    Protein / creatinine ratio, urine     PTH, intact (no Ca)    Phosphorus    4. Chronic bilateral low back pain with bilateral sciatica  M54.42 Oxycodone  HCl 10 MG TABS   M54.41    G89.29     5. Neck pain  M54.2 Oxycodone  HCl 10 MG TABS    6. High risk medication use  Z79.899 Oxycodone  HCl 10 MG TABS    7. Peripheral arterial disease (HCC)  I73.9 Lipid panel    8. 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 Chronic obstructive pulmonary disease, unspecified COPD type (HCC)  J44.9         This document was synthesized by artificial intelligence ([employer]bridge) using HIPAA-compliant recording of the clinical interaction;   We discussed the use of AI scribe software for clinical note transcription with the patient, who gave verbal consent to proceed. additional Info: This encounter employed state-of-the-art, real-time, collaborative documentation. The patient actively reviewed and assisted in updating their electronic medical record on a shared screen, ensuring transparency and facilitating joint problem-solving for the problem list, overview, and plan. This approach promotes accurate, informed care. The treatment plan was discussed and reviewed in detail, including medication safety, potential side effects, and all patient questions.  We confirmed understanding and comfort with the plan. Follow-up instructions were established, including contacting the office for any concerns, returning if symptoms worsen, persist, or new symptoms develop, and precautions for potential emergency department visits.

## 2023-08-14 NOTE — Assessment & Plan Note (Signed)
 Stiff, borderline controlled with opioids

## 2023-08-14 NOTE — Patient Instructions (Signed)
 VISIT SUMMARY:  Today, we discussed your ongoing management for aortic valve regurgitation, chronic kidney disease, anemia, and chronic pain. You reported no new symptoms such as dizziness, swelling, or shortness of breath. We reviewed your current medications and upcoming specialist appointments.  YOUR PLAN:  -AORTIC VALVE REGURGITATION: Aortic valve regurgitation means your heart's aortic valve is not closing properly, causing blood to flow backward. Although you have no symptoms now, this condition can lead to serious heart problems over time. We are considering a minimally invasive valve repair or replacement to prevent future complications. You have follow-up appointments with your cardiologist and thoracic surgeon on July 29 and July 30.  -ANEMIA OF CHRONIC DISEASE: Anemia of chronic disease is a type of anemia that often occurs with chronic illnesses like kidney disease. Your hemoglobin levels are lower than normal, likely due to your kidney condition and heart valve issues. We will repeat blood work to monitor your hemoglobin levels and watch for symptoms like shortness of breath or lightheadedness.  -CHRONIC KIDNEY DISEASE STAGE 3B: Chronic kidney disease stage 3B means your kidneys are moderately damaged and not working as well as they should. Your GFR is between 30 and 45. We will continue to monitor your kidney function and assess for dehydration, which may be caused by your diuretic medication. No immediate blood work is needed.  -CHRONIC PAIN MANAGEMENT: Your chronic pain is being managed with oxycodone , which is helping with your pain. However, this medication can cause constipation. Continue taking oxycodone  10 mg as prescribed and use Miralax  if you experience constipation. We are also considering a referral to a pain management specialist.  -GENERAL HEALTH MAINTENANCE: We will monitor your lipid profile and encourage you to stay hydrated to prevent dehydration. Your lipid profile will  be checked during your next blood work.  INSTRUCTIONS:  Please follow up with your cardiologist on July 29 and your thoracic surgeon on July 30. Repeat blood work will be done to assess your hemoglobin and iron levels. Continue taking your medications as prescribed and use Miralax  if you experience constipation. Stay hydrated and monitor for any new symptoms.

## 2023-08-14 NOTE — Assessment & Plan Note (Signed)
 Severe aortic valve regurgitation, likely from a previous infection, is present. He is asymptomatic, with no dyspnea or syncope, but there is concern for long-term complications like high output heart failure and hemolysis leading to anemia. Valve repair or replacement is being considered, possibly via a minimally invasive transcatheter approach. Despite his questioning the need for surgery due to lack of symptoms, it is explained that the condition will eventually lead to cardiac deterioration and high output heart failure. The minimally invasive procedure avoids open-heart surgery, which is a significant consideration for him. Repeat blood work will assess erythropoiesis and iron levels. Follow-up appointments with the cardiologist and thoracic surgeon are scheduled for July 29 and July 30, respectively.

## 2023-08-14 NOTE — Assessment & Plan Note (Signed)
 Son assists with care and medication(s) management.  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  No signs of misuse.   PDMP reviewed during this encounter. Plan to transfer chronic opioid management to pain specialist as soon as logistics issues resolved

## 2023-08-15 LAB — IRON,TIBC AND FERRITIN PANEL
Ferritin: 216 ng/mL (ref 30–400)
Iron Saturation: 26 % (ref 15–55)
Iron: 52 ug/dL (ref 38–169)
Total Iron Binding Capacity: 203 ug/dL — ABNORMAL LOW (ref 250–450)
UIBC: 151 ug/dL (ref 111–343)

## 2023-08-16 LAB — CBC WITH DIFFERENTIAL/PLATELET
Basophils Absolute: 0.1 x10E3/uL (ref 0.0–0.2)
Basos: 1 %
EOS (ABSOLUTE): 0.7 x10E3/uL — ABNORMAL HIGH (ref 0.0–0.4)
Eos: 9 %
Hematocrit: 46.5 % (ref 37.5–51.0)
Hemoglobin: 14.8 g/dL (ref 13.0–17.7)
Immature Grans (Abs): 0 x10E3/uL (ref 0.0–0.1)
Immature Granulocytes: 0 %
Lymphocytes Absolute: 2.6 x10E3/uL (ref 0.7–3.1)
Lymphs: 32 %
MCH: 29.8 pg (ref 26.6–33.0)
MCHC: 31.8 g/dL (ref 31.5–35.7)
MCV: 94 fL (ref 79–97)
Monocytes Absolute: 0.7 x10E3/uL (ref 0.1–0.9)
Monocytes: 8 %
Neutrophils Absolute: 4.1 x10E3/uL (ref 1.4–7.0)
Neutrophils: 50 %
Platelets: 160 x10E3/uL (ref 150–450)
RBC: 4.96 x10E6/uL (ref 4.14–5.80)
RDW: 14.1 % (ref 11.6–15.4)
WBC: 8.1 x10E3/uL (ref 3.4–10.8)

## 2023-08-16 LAB — PROTEIN ELECTROPHORESIS, SERUM
A/G Ratio: 1.3 (ref 0.7–1.7)
Albumin ELP: 3.5 g/dL (ref 2.9–4.4)
Alpha 1: 0.2 g/dL (ref 0.0–0.4)
Alpha 2: 0.6 g/dL (ref 0.4–1.0)
Beta: 0.8 g/dL (ref 0.7–1.3)
Gamma Globulin: 1.2 g/dL (ref 0.4–1.8)
Globulin, Total: 2.8 g/dL (ref 2.2–3.9)

## 2023-08-16 LAB — COMPREHENSIVE METABOLIC PANEL WITH GFR
ALT: 20 IU/L (ref 0–44)
AST: 40 IU/L (ref 0–40)
Albumin: 3.6 g/dL — ABNORMAL LOW (ref 3.8–4.8)
Alkaline Phosphatase: 100 IU/L (ref 44–121)
BUN/Creatinine Ratio: 16 (ref 10–24)
BUN: 34 mg/dL — ABNORMAL HIGH (ref 8–27)
Bilirubin Total: <0.2 mg/dL (ref 0.0–1.2)
CO2: 18 mmol/L — ABNORMAL LOW (ref 20–29)
Calcium: 8 mg/dL — ABNORMAL LOW (ref 8.6–10.2)
Chloride: 106 mmol/L (ref 96–106)
Creatinine, Ser: 2.18 mg/dL — ABNORMAL HIGH (ref 0.76–1.27)
Globulin, Total: 2.7 g/dL (ref 1.5–4.5)
Glucose: 94 mg/dL (ref 70–99)
Potassium: 5.5 mmol/L — ABNORMAL HIGH (ref 3.5–5.2)
Sodium: 136 mmol/L (ref 134–144)
Total Protein: 6.3 g/dL (ref 6.0–8.5)
eGFR: 30 mL/min/1.73 — ABNORMAL LOW (ref >59)

## 2023-08-16 LAB — PHOSPHORUS: Phosphorus: 4.9 mg/dL — ABNORMAL HIGH (ref 2.8–4.1)

## 2023-08-16 LAB — PARATHYROID HORMONE, INTACT (NO CA): PTH: 28 pg/mL (ref 15–65)

## 2023-08-16 LAB — B12 AND FOLATE PANEL
Folate: 9.7 ng/mL (ref >3.0)
Vitamin B-12: 348 pg/mL (ref 232–1245)

## 2023-08-16 LAB — DIRECT ANTIGLOBULIN TEST (NOT AT ARMC): Coombs', Direct: NEGATIVE

## 2023-08-16 LAB — ERYTHROPOIETIN: Erythropoietin: 11.8 m[IU]/mL (ref 2.6–18.5)

## 2023-08-16 LAB — RETICULOCYTES: Retic Ct Pct: 1.1 % (ref 0.6–2.6)

## 2023-08-16 LAB — HAPTOGLOBIN: Haptoglobin: 172 mg/dL (ref 34–355)

## 2023-08-17 ENCOUNTER — Ambulatory Visit: Payer: Self-pay | Admitting: Internal Medicine

## 2023-08-18 NOTE — Telephone Encounter (Signed)
 Spoke with pt son Dov about labs and to stop the two labs provider would like for him to stop at this time. He will bring his dad in when able to repeat the labs.He stated he has cardiology  appt in the next coming up week or so .

## 2023-08-22 NOTE — Telephone Encounter (Signed)
 Spoke with patient regarding [employer]. No questions at this time. Will see Cardiology next week. Lorenda CHRISTELLA Code, RMA

## 2023-08-29 ENCOUNTER — Encounter: Payer: Self-pay | Admitting: Emergency Medicine

## 2023-08-29 ENCOUNTER — Ambulatory Visit: Attending: Emergency Medicine | Admitting: Emergency Medicine

## 2023-08-29 VITALS — BP 136/62 | HR 51 | Ht 71.0 in | Wt 154.0 lb

## 2023-08-29 DIAGNOSIS — N1832 Chronic kidney disease, stage 3b: Secondary | ICD-10-CM | POA: Diagnosis not present

## 2023-08-29 DIAGNOSIS — D509 Iron deficiency anemia, unspecified: Secondary | ICD-10-CM | POA: Diagnosis not present

## 2023-08-29 DIAGNOSIS — I330 Acute and subacute infective endocarditis: Secondary | ICD-10-CM

## 2023-08-29 DIAGNOSIS — E875 Hyperkalemia: Secondary | ICD-10-CM

## 2023-08-29 DIAGNOSIS — I5032 Chronic diastolic (congestive) heart failure: Secondary | ICD-10-CM | POA: Diagnosis not present

## 2023-08-29 DIAGNOSIS — Z79899 Other long term (current) drug therapy: Secondary | ICD-10-CM | POA: Diagnosis not present

## 2023-08-29 DIAGNOSIS — I10 Essential (primary) hypertension: Secondary | ICD-10-CM | POA: Diagnosis not present

## 2023-08-29 DIAGNOSIS — I480 Paroxysmal atrial fibrillation: Secondary | ICD-10-CM | POA: Diagnosis not present

## 2023-08-29 DIAGNOSIS — I351 Nonrheumatic aortic (valve) insufficiency: Secondary | ICD-10-CM

## 2023-08-29 LAB — CBC
Hematocrit: 31 % — ABNORMAL LOW (ref 37.5–51.0)
Hemoglobin: 10.3 g/dL — ABNORMAL LOW (ref 13.0–17.7)
MCH: 31.4 pg (ref 26.6–33.0)
MCHC: 33.2 g/dL (ref 31.5–35.7)
MCV: 95 fL (ref 79–97)
Platelets: 164 x10E3/uL (ref 150–450)
RBC: 3.28 x10E6/uL — ABNORMAL LOW (ref 4.14–5.80)
RDW: 13.9 % (ref 11.6–15.4)
WBC: 6.6 x10E3/uL (ref 3.4–10.8)

## 2023-08-29 LAB — BASIC METABOLIC PANEL WITH GFR
BUN/Creatinine Ratio: 15 (ref 10–24)
BUN: 32 mg/dL — ABNORMAL HIGH (ref 8–27)
CO2: 21 mmol/L (ref 20–29)
Calcium: 8.4 mg/dL — ABNORMAL LOW (ref 8.6–10.2)
Chloride: 101 mmol/L (ref 96–106)
Creatinine, Ser: 2.1 mg/dL — ABNORMAL HIGH (ref 0.76–1.27)
Glucose: 106 mg/dL — ABNORMAL HIGH (ref 70–99)
Potassium: 5.4 mmol/L — ABNORMAL HIGH (ref 3.5–5.2)
Sodium: 135 mmol/L (ref 134–144)
eGFR: 32 mL/min/1.73 — ABNORMAL LOW (ref >59)

## 2023-08-29 NOTE — Patient Instructions (Signed)
 Medication Instructions:  NO CHANGES    Lab Work: BMET AND CBC TO BE DONE TODAY.   Testing/Procedures: NONE  Follow-Up: At [facility], you and your health needs are our priority.  As part of our continuing mission to provide you with exceptional heart care, our providers are all part of one team.  This team includes your primary Cardiologist (physician) and Advanced Practice Providers or APPs (Physician Assistants and Nurse Practitioners) who all work together to provide you with the care you need, when you need it.  Your next appointment:   6-8 WEEKS.  Provider:   Vina Gull, MD OR Lum Louis, [place]

## 2023-08-29 NOTE — Progress Notes (Signed)
 Cardiology Office Note:    Date:  09/01/2023  ID:  ULAS ZUERCHER, DOB [birth year], MRN [id number] PCP: [name], MD   [facility] Providers Cardiologist:  Vina Gull, MD Cardiology APP:  [name], NP       Patient Profile:       Chief Complaint: 6-week follow-up History of Present Illness:  Michael Bender is a 78 y.o. male with visit-pertinent history of aortic regurgitation, hypertension, hyperlipidemia, diastolic dysfunction, orthostatic hypotension with recurrent syncope, paroxysmal atrial fibrillation, rhabdomyolysis, BPH, CVA, PAD, emphysema, GERD, DJD, HFmrEF   He was admitted 10/27/2022 through 11/02/2022 for AKI, bladder outlet obstruction, and traumatic rhabdomyolysis.  CK on admission was 2828 and was started on IV fluids with improvement.  His creatinine was 4.2 on admission and down to 1.61 at discharge.  He was admitted 11/05/2022 through 11/14/2022 with recurrent syncopal episode associated with generalized weakness and encephalopathy and found to have MSSA bacteremia on blood cultures on 11/05/2022 and septic arthritis.  He was started empirically on antibiotics.  ID was consulted and MRI of the left wrist was done concerning for septic arthritis.  Orthopedic surgery was consulted and he underwent left wrist capsulotomy and washout on 11/09/2022.  MRI of C-spine was done that showed discitis of C5-C6.  PICC line was inserted on 11/13/2022 and he was to continue antibiotics for 6 weeks.  Syncope felt to be due to orthostatic hypotension.  He was also found to be in new onset atrial fibrillation with RVR.  He was started on IV diltiazem  and spontaneously converted and switched to oral metoprolol  at discharge.  It was noted he was not a candidate for anticoagulation due to recurrent falls.  He was also newly diagnosed with T2DM with A1c of 6.5 and discharged on oral metformin .   He was admitted on 04/20/2023 through 04/24/2023 with acute on chronic heart  failure.  He presented with worsening exertional shortness of breath.  BNP was 2352.  Checks x-ray showed pulmonary congestion.  He was started on IV Lasix .  Echocardiogram showed newly reduced LVEF 40 to 45%, grade 1 diastolic dysfunction, normal RV. Had AKI so cardiac cath was deferred and diuretics were held at discharge.  Entresto  and spironolactone  were not started due to his renal function and beta-blocker use was not started due to severe COPD.   He followed up with advanced heart failure clinic on 05/01/2023.  It is unclear the etiology for the reduction in his EF.  Could be related to recent MSSA sepsis versus uncontrolled hypertension.  He was NYHA class II and volume status okay during clinic visit.  He was started on losartan  25 mg daily.  He was continued on hydralazine  50 mg 3 times daily and Imdur  30 mg daily.  SGLT2i was avoided due to concern for hygiene and volume depletion.  Cardiac MRI was ordered to evaluate for infiltrative disease and is currently pending.   He was seen in clinic on 06/07/2023.  He is doing well at the time.  His blood pressure was elevated in office at 164/74.  His losartan  was discontinued and he was started on Entresto  49-51 mg twice daily and spironolactone  12.5 mg daily.  Follow-up lab work on 05/19/2023 showed worsening creatinine of 1.6.  Spironolactone  was discontinued at that time.   Cardiac MRI 06/06/2023 showed LVEF 42%, moderate to severe aortic regurgitation consistent with aortic regurgitation associated with left ventricular dysfunction, evidence of mild ascending dilation of aorta measuring 43 mm.  Patient was  seen on 07/20/2023.  Patient was overall asymptomatic with chronic stable dyspnea.  He was started on bisoprolol  5 mg daily.  TEE was scheduled for further evaluation of his severe aortic regurgitation.  TEE was completed on 07/24/2023 showing oscillating linear density (0.7-0.8 cm) on left coronary cusp of aortic valve concerning for vegetation and  severe aortic regurgitation, LVEF 50 to 55%, no RWMA, mild mitral valve regurgitation, moderate grade 3 plaque involving the ascending aorta.  Blood cultures were drawn and he was referred to infectious disease.  Seen by ID on 08/03/2023 for suspected the AV vegetation (with negative blood cultures and no sign of sepsis).  Per ID it was suspected that the AV vegetation is residual from prior infection on 11/2018 for MSSA BSI.  Plan was discussed with cardiology and was agreed not to start IV antibiotics given likelihood of residual infection.  He recently had his labs drawn with PCP on 7/14 showing creatinine of 2.18, GFR 38, and potassium 5.5.  His Jardiance  were subsequently held at that time.   Discussed the use of AI scribe software for clinical note transcription with the patient, who gave verbal consent to proceed.  History of Present Illness Michael Bender is a 78 year old male with severe aortic valve regurgitation who presents for follow-up.  He presents today with his son.  Today patient is doing well overall and tells me he feels fine.  He continues to be without any cardiovascular concerns or complaints at this time.  He continues to deny any chest pains, shortness of breath, syncope, presyncope, orthopnea, PND, lightheadedness, dizziness, or leg swelling.  [place] and he continues to walk daily without cardiac limitation.  His weight has been stable and he denies any peripheral edema.  Review of systems:  Please see the history of present illness. All other systems are reviewed and otherwise negative.      Studies Reviewed:        Echocardiogram TEE 07/24/2023  1. Oscillating, linear density (0.7-0.8 cm) on left coronary cusp of  aortic valve concerning for vegetation; severe AI (best seen on  transgastric views).   2. Left ventricular ejection fraction, by estimation, is 50 to 55%. The  left ventricle has low normal function. The left ventricle has no regional   wall motion abnormalities. The left ventricular internal cavity size was  mildly dilated.   3. Right ventricular systolic function is mildly reduced. The right  ventricular size is normal.   4. Left atrial size was moderately dilated. No left atrial/left atrial  appendage thrombus was detected.   5. The mitral valve is normal in structure. Mild mitral valve  regurgitation.   6. The aortic valve is tricuspid. Aortic valve regurgitation is severe.   7. There is Moderate (Grade III) plaque involving the descending aorta.   8. 3D performed of the aortic valve.   Cardiac MRI 06/06/2023 IMPRESSION: 1. Mild decrease left ventricular systolic function (LVEF =42%).   2. Moderate to severe regurgitation with regurgitant fraction 35% with moderate left ventricular dilation. This is markedly different that color Doppler evaluation on recent echocardiogram. Consider TEE assessment as this MRI is consistent with aortic regurgitation associated left ventricular dysfunction.   3. Evidence of mild ascending aortic dilation, 43 mm.   Echocardiogram 04/21/2023  1. Left ventricular ejection fraction, by estimation, is 40 to 45%. The  left ventricle has mildly decreased function. The left ventricle  demonstrates global hypokinesis. There is mild left ventricular  hypertrophy.  Left ventricular diastolic parameters  are consistent with Grade I diastolic dysfunction (impaired relaxation).   2. Right ventricular systolic function is normal. The right ventricular  size is normal. There is mildly elevated pulmonary artery systolic  pressure. The estimated right ventricular systolic pressure is 38.0 mmHg.   3. Moderate pleural effusion in both left and right lateral regions.   4. The mitral valve is abnormal. Trivial mitral valve regurgitation.   5. The aortic valve is tricuspid. Aortic valve regurgitation is trivial.  Aortic valve sclerosis/calcification is present, without any evidence of  aortic stenosis.    6. The inferior vena cava is normal in size with greater than 50%  respiratory variability, suggesting right atrial pressure of 3 mmHg.  Risk Assessment/Calculations:             Physical Exam:   VS:  BP 136/62 (BP Location: Right Arm, Patient Position: Sitting, Cuff Size: Normal)   Pulse (!) 51   Ht 5' 11 (1.803 m)   Wt 154 lb (69.9 kg)   SpO2 95%   BMI 21.48 kg/m    Wt Readings from Last 3 Encounters:  08/30/23 157 lb (71.2 kg)  08/29/23 154 lb (69.9 kg)  08/14/23 150 lb 9.6 oz (68.3 kg)    GEN: Well nourished, well developed in no acute distress NECK: No JVD; No carotid bruits CARDIAC: RRR.  2/6 systolic murmur.  No rubs, gallops RESPIRATORY:  Clear to auscultation without rales, wheezing or rhonchi  ABDOMEN: Soft, non-tender, non-distended EXTREMITIES:  No edema; No acute deformity      Assessment and Plan:  HFimpEF Echocardiogram 11/2022 with LVEF 60 to 65% Echocardiogram 04/2023 with LVEF 40 to 45% Cardiac MRI 06/2023 with LVEF 42% and RVEF 48% with no LGE or evidence of infiltrative disease with severe aortic regurgitation associated with LV dysfunction TEE 07/2023 showed LVEF 50 to 55%, severe aortic valve regurgitation, oscillating linear density (0.7-0.8 cm) on left coronary cusp of aortic valve concerning for vegetation As outlined above his TEE shows vegetation on aortic valve and severe aortic insufficiency. Patient likely had endocarditis from his infection (septic arthritis in the left wrist and discitis) in October 2024. This is likely the cause of his congestive heart failure admission on 04/2023 which his echo at that time interestingly read trivial AR and failed to show evidence of endocarditis  - Evaluated by ID on 08/2023 who suspected his aortic valve vegetation is residual from prior infection on 11/2022 with MSSA BSI where he was appropriately treated with IV antibiotics.  Dr. Okey and ID agreed no need to restart antibiotic treatment given negative blood  cultures and no signs of sepsis - Most recent TEE did show improvement in his LVEF, now low normal - Recently Jardiance  was held by PCP for GFR of 30 and potassium of 5.5 - Spironolactone  contradicted given CKD and h/o hyperkalemia - Today patient remains euvolemic and well compensated on exam with no acute complaints.  No volume overload.  NYHA class II (chronic stable dyspnea h/o COPD).  Denies SOB at rest, orthopnea, PND, chest pains.  Remains active without limitation and not requiring loop diuretic therapy - Plan to repeat BMET today and consider reintroducing Jardiance  pending GFR - Continue current GDMT of bisoprolol  5 mg daily and Entresto  49-51 mg twice daily - Continue hydralazine  50 mg 3 times daily and Imdur  60 mg daily  - Scheduled see cardiothoracic surgery on 7/30 for evaluation of his severe AR   Severe aortic regurgitation Bacterial endocarditis Echocardiogram  04/2023 showed trivial aortic valve regurgitation Cardiac MRI 06/2023 showed moderate to severe regurgitation with regurgitant fraction of 35% with moderate left ventricular dilation consistent with aortic regurgitation associated left ventricular dysfunction TEE 07/2023 showed LVEF 50 to 55%, severe aortic valve regurgitation, oscillating linear density (0.7-0.8 cm) on left coronary cusp aortic valve concerning for vegetation Blood cultures on 07/2023 were negative. - Evaluated by ID on 08/2023 who suspected aortic valve vegetation is residual from prior infection on 11/2022 with MSSA BSI.  Dr. Okey and ID agreed no need to restart antibiotic treatment - Today he is overall asymptomatic.  He denies any syncope, presyncope, dizziness, lightheadedness. Chronic DOE has remained stable - Scheduled to see cardiothoracic surgery on 7/30 for evaluation of his severe AR   CKD stage IIIb Creatinine 2.18 and GFR 30 on 08/2023 Avoid NSAIDs and stay adequately hydrated - Repeat BMET today   Hypertension Blood pressure today is  controlled at 136/62 - Continue HF GDMT of bisoprolol  5 mg daily, Entresto  49-51 mg twice daily - Continue amlodipine  5 mg daily, hydralazine  50 mg every 8 hours, Imdur  60 mg daily   Paroxysmal atrial fibrillation Episode noted during admission with sepsis/MSSA bacteremia on 11/2022 and spontaneously converted to sinus rhythm.  Was noted not to be a candidate for anticoagulation due to recurrent falls at the time as he was also noted to have history of GI bleed due to duodenal ulcer in 2021 - Patient denies any symptoms concerning for recurrent atrial fibrillation   Iron deficiency anemia Hemoglobin 14.8 and hematocrit 46.5 in 08/2023 - Well-controlled - Continue iron/vitamin supplement  Hyperkalemia Potassium 5.5 on 08/2023 - Repeat BMET today     Dispo:  Return in about 8 weeks (around 10/24/2023).  Signed, Lum LITTIE Louis, NP

## 2023-08-30 ENCOUNTER — Encounter: Payer: Self-pay | Admitting: Surgery

## 2023-08-30 ENCOUNTER — Ambulatory Visit: Payer: Self-pay | Admitting: Emergency Medicine

## 2023-08-30 ENCOUNTER — Other Ambulatory Visit: Payer: Self-pay | Admitting: Surgery

## 2023-08-30 ENCOUNTER — Ambulatory Visit: Attending: Surgery | Admitting: Surgery

## 2023-08-30 VITALS — BP 165/62 | HR 57 | Resp 18 | Ht 71.0 in | Wt 157.0 lb

## 2023-08-30 DIAGNOSIS — I351 Nonrheumatic aortic (valve) insufficiency: Secondary | ICD-10-CM | POA: Diagnosis not present

## 2023-08-30 DIAGNOSIS — Z79899 Other long term (current) drug therapy: Secondary | ICD-10-CM

## 2023-08-30 NOTE — Progress Notes (Signed)
 [address], [place] [address]             [phone]     Cardiothoracic Surgery Consultation  PCP is [name], MD Referring Provider is Okey Vina GAILS, MD  Chief Complaint  Patient presents with   Aortic Insuffiency    HPI:  The patient is a 78 year old gentleman with a history of hypertension, hyperlipidemia, GI bleeding due to duodenal ulcer, peripheral vascular disease,  smoking and COPD, combined systolic and diastolic heart failure, degenerative spine disease status post multiple surgeries, who presented in October 2020 for after multiple falls with traumatic rhabdomyolysis and acute kidney failure.  He was diagnosed with MSSA bacteremia with positive blood cultures on 11/05/2022 as well as septic arthritis of his left wrist and right knee requiring washout of his left wrist.  A 2D echocardiogram on 11/07/2022 showed a trileaflet aortic valve with no insufficiency or stenosis.  There is no mitral regurgitation.  Left ventricular ejection fraction was 60 to 65%.  A follow-up echocardiogram on 04/21/2023 showed a decreased ejection fraction of 40 to 45% with global hypokinesis.  There was aortic valve sclerosis/calcification without any evidence of stenosis and trivial insufficiency.  A cardiac MRI on 06/06/2023 showed multiple jets of aortic insufficiency with moderate to severe regurgitation with a regurgitant fraction of 35%.  Left ventricular ejection fraction was 42%.  There is mild ascending aortic dilation to 4.3 cm.  Left ventricular diastolic diameter was 5.6 cm.  He had a TEE performed on 07/24/2023 showing a trileaflet aortic valve with severe regurgitation.  There was an oscillating linear density measuring 0.7 to 0.8 cm on the left coronary cusp concerning for vegetation.  Left ventricular ejection fraction was 50 to 55%.  There is mild mitral regurgitation.  The patient is here today with his son.  He remains active without limitation.  Denies any  shortness of breath or fatigue.  Denies orthopnea and peripheral edema.  He has had no dizziness. Past Medical History:  Diagnosis Date   Acute combined systolic and diastolic heart failure (HCC) 04/22/2023   Acute on chronic diastolic CHF (congestive heart failure) (HCC) 04/20/2023   AKI (acute kidney injury) (HCC) 10/28/2022   Arthritis    Back   Balance problems 02/21/2023   Blood transfusion    as a child   Cervical discitis 11/10/2022   Chronic back pain    COPD (chronic obstructive pulmonary disease) (HCC)    Disturbance of skin sensation 05/22/2018   Effusion of right knee 02/21/2023   Effusion of right knee joint 01/16/2023   Elevated troponin 04/22/2023   Essential hypertension 03/05/2007   Qualifier: Diagnosis of   By: Krystal MD, Reyes LABOR      Finger pain, left 11/08/2022   Gastric erosion    Gastritis and gastroduodenitis    Gastrointestinal hemorrhage with melena 11/20/2018   GIB (gastrointestinal bleeding) 07/18/2019   Hardware complicating wound infection (HCC) 11/10/2022   Headache(784.0)    last one 6 months ago   Hemorrhoids, internal    High risk medication use 03/03/2022   History of duodenal ulcer    History of fusion of cervical spine 03/03/2022   With persistent cervical pain and palpable screws  Led to disability  History of attempt to dig furrow in skull to fix it  Last surgery 1992     History of lumbar fusion 03/03/2022   RE-OPERATIVE DIAGNOSIS:  lumbar stenosis synovial cyst lumbar spondylosis spondylolisthesis  lumbar radiculoapthy L4/5   PROCEDURE:  Procedure(s): POSTERIOR LUMBAR FUSION 1 LEVEL with resection of synovial cyst     History of upper gastrointestinal bleeding 03/03/2022   Duodenal ulcer 2021 Dr. Avram   HLD (hyperlipidemia)    Hypertension    Hypokalemia 04/22/2023   Hypomagnesemia 04/22/2023   Infected blister of left index finger 11/07/2022   Intractable pain 03/03/2022   Loss of weight    Wt Readings from Last 10  Encounters:  04/05/22  146 lb (66.2 kg)  03/03/22  143 lb 9.6 oz (65.1 kg)  07/14/21  153 lb (69.4 kg)  12/14/20  147 lb 12.8 oz (67 kg)  11/13/19  154 lb (69.9 kg)  09/16/19  146 lb (66.2 kg)  08/06/19  146 lb (66.2 kg)  07/19/19  144 lb 13.5 oz (65.7 kg)  07/17/19  148 lb (67.1 kg)  07/09/19  147 lb 9.6 oz (67 kg)         MSSA bacteremia 11/07/2022   Neck rigidity    post cervical fusion   Nocturia    PAIN, CHRONIC NEC 10/06/2006   Qualifier: Diagnosis of   By: Krystal RN, Leeroy       Pneumonia    Prostate disease    Right sided temporal headache 05/22/2018   S/P cervical spinal fusion 03/03/2022   With persistent cervical pain and palpable screws Led to disability History of attempt to dig furrow in skull to fix it Last surgery 1992   Senile ecchymosis 01/21/2019   Septic arthritis of wrist, left (HCC) 11/08/2022   Septic infrapatellar bursitis of right knee 11/07/2022   Staphylococcal Arthritis of Right Knee (Updated 01/30/2023) MRI 01/17/2023 shows worsening findings:  Worsening tear of posterior horn medial meniscus with large radial component Worsening subcortical stress fracture/osteochondral lesion of medial femoral condyle New subcortical stress fracture/osteochondral lesion of medial tibial plateau Moderate-to-large effusion with worsened synovitis    Staphylococcal arthritis of left wrist (HCC) 11/07/2022   Staphylococcal arthritis of right knee (HCC) 11/08/2022   Syncope 11/05/2022   Underweight on examination 05/03/2022    Past Surgical History:  Procedure Laterality Date   BIOPSY  07/19/2019   Procedure: BIOPSY;  Surgeon: San Sandor GAILS, DO;  Location: [facility];  Service: Gastroenterology;;   CERVICAL FUSION  1992   C2/C 3  four surgeries   COLONOSCOPY     ESOPHAGOGASTRODUODENOSCOPY  07/20/2011   Procedure: ESOPHAGOGASTRODUODENOSCOPY (EGD);  Surgeon: Lupita FORBES Avram, MD;  Location: [facility];  Service: Endoscopy;  Laterality: N/A;    ESOPHAGOGASTRODUODENOSCOPY (EGD) WITH PROPOFOL  N/A 07/19/2019   Procedure: ESOPHAGOGASTRODUODENOSCOPY (EGD) WITH PROPOFOL ;  Surgeon: San Sandor GAILS, DO;  Location: [facility];  Service: Gastroenterology;  Laterality: N/A;   I & D EXTREMITY Left 11/09/2022   Procedure: IRRIGATION AND DEBRIDEMENT LEFT WRIST;  Surgeon: Arlinda Buster, MD;  Location: [facility];  Service: Orthopedics;  Laterality: Left;   SAVORY DILATION  07/20/2011   Procedure: SAVORY DILATION;  Surgeon: Lupita FORBES Avram, MD;  Location: [facility];  Service: Endoscopy;  Laterality: N/A;   SPINAL FUSION  05/06/11   TRANSESOPHAGEAL ECHOCARDIOGRAM (CATH LAB) N/A 07/24/2023   Procedure: TRANSESOPHAGEAL ECHOCARDIOGRAM;  Surgeon: Pietro Redell RAMAN, MD;  Location: [facility] CV [facility];  Service: Cardiovascular;  Laterality: N/A;    Family History  Problem Relation Age of Onset   Heart disease Mother    Heart attack Mother 36   Pancreatic cancer Father 40   Pancreatic cancer Brother    Anesthesia problems Neg Hx  Colon cancer Neg Hx    Liver cancer Neg Hx    Stomach cancer Neg Hx    Esophageal cancer Neg Hx    Rectal cancer Neg Hx     Social History Social History   Tobacco Use   Smoking status: Former    Current packs/day: 0.00    Types: Cigarettes    Start date: 12/12/1978    Quit date: 12/12/2018    Years since quitting: 4.7   Smokeless tobacco: Never  Vaping Use   Vaping status: Never Used  Substance Use Topics   Alcohol use: Not Currently    Alcohol/week: 2.0 standard drinks of alcohol    Types: 2 Shots of liquor per week   Drug use: No    Current Outpatient Medications  Medication Sig Dispense Refill   amitriptyline  (ELAVIL ) 50 MG tablet Take 1 tablet (50 mg total) by mouth at bedtime. 90 tablet 3   amLODipine  (NORVASC ) 5 MG tablet Take 1 tablet (5 mg total) by mouth daily. 30 tablet 0   bisoprolol  (ZEBETA ) 5 MG tablet Take 1 tablet (5 mg total) by mouth daily. 90 tablet 1   cefadroxil  (DURICEF) 500 MG  capsule Take 500 mg by mouth 2 (two) times daily.     cyclobenzaprine  (FLEXERIL ) 10 MG tablet Take 1 tablet (10 mg total) by mouth 3 (three) times daily as needed. 270 tablet 3   doxycycline  (VIBRA -TABS) 100 MG tablet Take 1 tablet (100 mg total) by mouth 2 (two) times daily. 20 tablet 0   FeFum-FePoly-FA-B Cmp-C-Biot (INTEGRA PLUS ) CAPS Take 1 capsule by mouth daily. 30 capsule 5   hydrALAZINE  (APRESOLINE ) 50 MG tablet Take 1 tablet (50 mg total) by mouth every 8 (eight) hours. 90 tablet 0   ipratropium-albuterol  (DUONEB) 0.5-2.5 (3) MG/3ML SOLN Take 3 mLs by nebulization every 4 (four) hours as needed. 90 mL 1   isosorbide  mononitrate (IMDUR ) 60 MG 24 hr tablet Take 1 tablet (60 mg total) by mouth daily. 30 tablet 0   Oxycodone  HCl 10 MG TABS Take 1 tablet (10 mg total) by mouth every 8 (eight) hours as needed. 90 tablet 0   pantoprazole  (PROTONIX ) 40 MG tablet Take 1 tablet (40 mg total) by mouth daily. 90 tablet 3   polyethylene glycol (MIRALAX  / GLYCOLAX ) 17 g packet Take 17 g by mouth daily as needed for moderate constipation. 14 each 0   Respiratory Therapy Supplies (NEBULIZER/TUBING/MOUTHPIECE) KIT 1 each by Does not apply route every 4 (four) hours as needed. 1 kit 1   Respiratory Therapy Supplies (NEBULIZER/TUBING/MOUTHPIECE) KIT 1 each by Does not apply route every 4 (four) hours as needed. 1 kit 1   rosuvastatin  (CRESTOR ) 40 MG tablet TAKE 1 TABLET BY MOUTH DAILY. REPLACES ATORVASTATIN  (STOP ATORVASTATIN  IF STILL TAKING) 90 tablet 3   sacubitril -valsartan  (ENTRESTO ) 49-51 MG Take 1 tablet by mouth 2 (two) times daily. 180 tablet 2   SUMAtriptan  (IMITREX ) 50 MG tablet TAKE 1 TABLET (50 MG TOTAL) BY MOUTH DAILY. MAY REPEAT IN 2 HOURS IF HEADACHE PERSISTS OR RECURS. 9 tablet 1   empagliflozin  (JARDIANCE ) 10 MG TABS tablet Take 1 tablet (10 mg total) by mouth daily before breakfast. (Patient not taking: Reported on 08/30/2023) 90 tablet 3   losartan  (COZAAR ) 25 MG tablet Take 25 mg by mouth  daily. (Patient not taking: Reported on 08/30/2023)     spironolactone  (ALDACTONE ) 25 MG tablet Take 12.5 mg by mouth daily. (Patient not taking: Reported on 08/30/2023)     No  current facility-administered medications for this visit.    Allergies  Allergen Reactions   Nubain [Nalbuphine Hcl]     Muscle contraction    Review of Systems  Constitutional:  Negative for activity change, chills, fatigue, fever and unexpected weight change.  HENT: Negative.         Dentures  Eyes: Negative.   Respiratory:  Negative for shortness of breath.   Cardiovascular:  Negative for chest pain and leg swelling.  Gastrointestinal: Negative.   Endocrine: Negative.   Genitourinary: Negative.   Musculoskeletal:  Positive for arthralgias.  Skin: Negative.   Allergic/Immunologic: Negative.   Neurological:  Negative for dizziness and syncope.  Hematological: Negative.   Psychiatric/Behavioral: Negative.      BP (!) 165/62   Pulse (!) 57   Resp 18   Ht 5' 11 (1.803 m)   Wt 157 lb (71.2 kg)   SpO2 97%   BMI 21.90 kg/m  Physical Exam Constitutional:      Comments: Frail appearing elderly gentleman in no distress  HENT:     Head: Normocephalic and atraumatic.  Eyes:     Extraocular Movements: Extraocular movements intact.     Conjunctiva/sclera: Conjunctivae normal.     Pupils: Pupils are equal, round, and reactive to light.  Neck:     Vascular: No carotid bruit.  Cardiovascular:     Rate and Rhythm: Normal rate and regular rhythm.     Pulses: Normal pulses.     Heart sounds: Murmur heard.     Comments: 2/6 systolic and diastolic murmur along the left lower sternal border. Pulmonary:     Effort: Pulmonary effort is normal.     Breath sounds: Normal breath sounds.  Abdominal:     General: Abdomen is flat. Bowel sounds are normal. There is no distension.     Palpations: Abdomen is soft.     Tenderness: There is no abdominal tenderness.  Musculoskeletal:        General: No swelling.      Cervical back: Normal range of motion and neck supple.  Skin:    General: Skin is warm and dry.  Neurological:     General: No focal deficit present.     Mental Status: He is alert and oriented to person, place, and time.  Psychiatric:        Mood and Affect: Mood normal.        Behavior: Behavior normal.      Diagnostic Tests:  TRANSESOPHOGEAL ECHO REPORT       Patient Name:   Michael Bender Date of Exam: 07/24/2023  Medical Rec #:  [id number]        Height:       71.0 in  Accession #:    [id number]       Weight:       150.0 lb  Date of Birth:  04-23-1945       BSA:          1.866 m  Patient Age:    42 years         BP:           142/44 mmHg  Patient Gender: M                HR:           50 bpm.  Exam Location:  Outpatient   Procedure: Transesophageal Echo, Cardiac Doppler, Color Doppler and 3D  Echo            (  Both Spectral and Color Flow Doppler were utilized during             procedure).   Indications:     aortic insufficiency    History:         Patient has prior history of Echocardiogram examinations,  most                  recent 04/21/2023. Risk Factors:Hypertension.    Sonographer:     Philomena Daring  Sonographer#2:   Tinnie Barefoot RDCS  Referring Phys:  [id number] MADISON L FOUNTAIN  Diagnosing Phys: Redell Shallow MD   PROCEDURE: After discussion of the risks and benefits of a TEE, an  informed consent was obtained from the patient. The transesophogeal probe  was passed without difficulty through the esophogus of the patient. Imaged  were obtained with the patient in a  left lateral decubitus position. Sedation performed by different  physician. The patient was monitored while under deep sedation.  Anesthestetic sedation was provided intravenously by Anesthesiology: 300mg   of Propofol . Image quality was excellent. The  patient developed no complications during the procedure.    IMPRESSIONS     1. Oscillating, linear density (0.7-0.8 cm) on left  coronary cusp of  aortic valve concerning for vegetation; severe AI (best seen on  transgastric views).   2. Left ventricular ejection fraction, by estimation, is 50 to 55%. The  left ventricle has low normal function. The left ventricle has no regional  wall motion abnormalities. The left ventricular internal cavity size was  mildly dilated.   3. Right ventricular systolic function is mildly reduced. The right  ventricular size is normal.   4. Left atrial size was moderately dilated. No left atrial/left atrial  appendage thrombus was detected.   5. The mitral valve is normal in structure. Mild mitral valve  regurgitation.   6. The aortic valve is tricuspid. Aortic valve regurgitation is severe.   7. There is Moderate (Grade III) plaque involving the descending aorta.   8. 3D performed of the aortic valve.   FINDINGS   Left Ventricle: Left ventricular ejection fraction, by estimation, is 50  to 55%. The left ventricle has low normal function. The left ventricle has  no regional wall motion abnormalities. The left ventricular internal  cavity size was mildly dilated.   Right Ventricle: The right ventricular size is normal. Right ventricular  systolic function is mildly reduced.   Left Atrium: Left atrial size was moderately dilated. No left atrial/left  atrial appendage thrombus was detected.   Right Atrium: Right atrial size was normal in size.   Pericardium: There is no evidence of pericardial effusion.   Mitral Valve: The mitral valve is normal in structure. Mild mitral valve  regurgitation.   Tricuspid Valve: The tricuspid valve is normal in structure. Tricuspid  valve regurgitation is trivial.   Aortic Valve: The aortic valve is tricuspid. Aortic valve regurgitation is  severe.   Pulmonic Valve: The pulmonic valve was normal in structure. Pulmonic valve  regurgitation is not visualized.   Aorta: The aortic root is normal in size and structure. There is moderate   (Grade III) plaque involving the descending aorta.   IAS/Shunts: No atrial level shunt detected by color flow Doppler.   Additional Comments: Oscillating, linear density (0.7-0.8 cm) on left  coronary cusp of aortic valve concerning for vegetation; severe AI (best  seen on transgastric views).     AORTA  Ao Root diam: 3.50 cm  Ao Asc  diam:  3.80 cm   Redell Shallow MD  Electronically signed by Redell Shallow MD  Signature Date/Time: 07/24/2023/12:17:18 PM        Final     Impression:  This 78 year old gentleman has severe aortic insufficiency with a mobile linear density on his left coronary cusp that could be a vegetation or a piece of the degenerated leaflet.  He was treated for MSSA bacteremia with septic arthritis in October 2024.  Echocardiogram at that time showed no evidence of vegetation or aortic insufficiency.  Echocardiogram in March 2025 showed trivial aortic insufficiency.  He is currently asymptomatic by his report but I am not sure how active he is.  He looks somewhat frail and chronically ill.  I think aortic valve replacement is probably the best treatment for him to prevent progressive left ventricular dysfunction and worsening heart failure symptoms related to severe aortic insufficiency.  This would require open surgery because he is not a candidate for TAVR with a possible vegetation and pure aortic insufficiency.  I reviewed the echocardiogram images with the patient and his son and answered their questions.  He would like to hold off on surgery for now and continue medical therapy and observation.  I told him that he should have a follow-up echocardiogram within 6 months to assess his left ventricular function and size.  I discussed the signs and symptoms of congestive heart failure and he will continue to monitor for those.  Plan:  He will call us  back if he decides to proceed with open surgical aortic valve replacement.  Otherwise he will continue to follow-up  with cardiology.  He will require a repeat echocardiogram within 6 months of his last echo.  I spent 60 minutes performing this consultation and > 50% of this time was spent face to face counseling and coordinating the care of this patient's severe aortic insufficiency.  Dorise MARLA Fellers, MD [facility] [phone]

## 2023-08-31 LAB — BASIC METABOLIC PANEL WITH GFR
BUN/Creatinine Ratio: 16 (ref 10–24)
BUN: 35 mg/dL — ABNORMAL HIGH (ref 8–27)
CO2: 20 mmol/L (ref 20–29)
Calcium: 8.2 mg/dL — ABNORMAL LOW (ref 8.6–10.2)
Chloride: 103 mmol/L (ref 96–106)
Creatinine, Ser: 2.21 mg/dL — ABNORMAL HIGH (ref 0.76–1.27)
Glucose: 140 mg/dL — ABNORMAL HIGH (ref 70–99)
Potassium: 5.4 mmol/L — ABNORMAL HIGH (ref 3.5–5.2)
Sodium: 137 mmol/L (ref 134–144)
eGFR: 30 mL/min/1.73 — ABNORMAL LOW (ref >59)

## 2023-09-01 ENCOUNTER — Other Ambulatory Visit: Payer: Self-pay | Admitting: *Deleted

## 2023-09-01 MED ORDER — SACUBITRIL-VALSARTAN 24-26 MG PO TABS: 1.0000 | ORAL_TABLET | Freq: Two times a day (BID) | ORAL | 3 refills | Status: DC

## 2023-09-01 MED ORDER — LOKELMA 10 G PO PACK: 10.0000 g | PACK | Freq: Every day | ORAL | 0 refills | Status: DC

## 2023-09-01 NOTE — Addendum Note (Signed)
 Addended by: Kaylob Wallen L on: 09/01/2023 08:11 AM   Modules accepted: Orders

## 2023-09-01 NOTE — Addendum Note (Signed)
 Addended by: BILLY CAMELIA CROME on: 09/01/2023 12:54 PM   Modules accepted: Orders

## 2023-09-14 ENCOUNTER — Emergency Department (HOSPITAL_BASED_OUTPATIENT_CLINIC_OR_DEPARTMENT_OTHER)
Admission: EM | Admit: 2023-09-14 | Discharge: 2023-09-14 | Disposition: A | Discharge status: Home / Self Care | Source: Ambulatory Visit | Attending: Emergency Medicine | Admitting: Emergency Medicine

## 2023-09-14 ENCOUNTER — Other Ambulatory Visit: Payer: Self-pay

## 2023-09-14 ENCOUNTER — Encounter: Payer: Self-pay | Admitting: Internal Medicine

## 2023-09-14 ENCOUNTER — Telehealth (INDEPENDENT_AMBULATORY_CARE_PROVIDER_SITE_OTHER): Admitting: Internal Medicine

## 2023-09-14 ENCOUNTER — Encounter (HOSPITAL_BASED_OUTPATIENT_CLINIC_OR_DEPARTMENT_OTHER): Payer: Self-pay

## 2023-09-14 DIAGNOSIS — R2681 Unsteadiness on feet: Secondary | ICD-10-CM | POA: Diagnosis not present

## 2023-09-14 DIAGNOSIS — F1120 Opioid dependence, uncomplicated: Secondary | ICD-10-CM

## 2023-09-14 DIAGNOSIS — D649 Anemia, unspecified: Secondary | ICD-10-CM | POA: Diagnosis not present

## 2023-09-14 DIAGNOSIS — R531 Weakness: Secondary | ICD-10-CM | POA: Diagnosis not present

## 2023-09-14 DIAGNOSIS — R159 Full incontinence of feces: Secondary | ICD-10-CM | POA: Diagnosis not present

## 2023-09-14 DIAGNOSIS — R202 Paresthesia of skin: Secondary | ICD-10-CM | POA: Diagnosis not present

## 2023-09-14 DIAGNOSIS — R269 Unspecified abnormalities of gait and mobility: Secondary | ICD-10-CM

## 2023-09-14 DIAGNOSIS — I6789 Other cerebrovascular disease: Secondary | ICD-10-CM

## 2023-09-14 DIAGNOSIS — I679 Cerebrovascular disease, unspecified: Secondary | ICD-10-CM | POA: Insufficient documentation

## 2023-09-14 DIAGNOSIS — G8929 Other chronic pain: Secondary | ICD-10-CM

## 2023-09-14 DIAGNOSIS — I351 Nonrheumatic aortic (valve) insufficiency: Secondary | ICD-10-CM | POA: Diagnosis not present

## 2023-09-14 DIAGNOSIS — R29898 Other symptoms and signs involving the musculoskeletal system: Secondary | ICD-10-CM

## 2023-09-14 DIAGNOSIS — M5442 Lumbago with sciatica, left side: Secondary | ICD-10-CM | POA: Diagnosis not present

## 2023-09-14 DIAGNOSIS — M961 Postlaminectomy syndrome, not elsewhere classified: Secondary | ICD-10-CM | POA: Insufficient documentation

## 2023-09-14 DIAGNOSIS — Z79899 Other long term (current) drug therapy: Secondary | ICD-10-CM | POA: Diagnosis not present

## 2023-09-14 DIAGNOSIS — R69 Illness, unspecified: Secondary | ICD-10-CM

## 2023-09-14 DIAGNOSIS — M5441 Lumbago with sciatica, right side: Secondary | ICD-10-CM | POA: Diagnosis not present

## 2023-09-14 DIAGNOSIS — K5900 Constipation, unspecified: Secondary | ICD-10-CM | POA: Diagnosis not present

## 2023-09-14 DIAGNOSIS — M542 Cervicalgia: Secondary | ICD-10-CM

## 2023-09-14 LAB — CBC
HCT: 30 % — ABNORMAL LOW (ref 39.0–52.0)
Hemoglobin: 9.9 g/dL — ABNORMAL LOW (ref 13.0–17.0)
MCH: 30.6 pg (ref 26.0–34.0)
MCHC: 33 g/dL (ref 30.0–36.0)
MCV: 92.6 fL (ref 80.0–100.0)
Platelets: 115 K/uL — ABNORMAL LOW (ref 150–400)
RBC: 3.24 MIL/uL — ABNORMAL LOW (ref 4.22–5.81)
RDW: 14 % (ref 11.5–15.5)
WBC: 5.9 K/uL (ref 4.0–10.5)
nRBC: 0 % (ref 0.0–0.2)

## 2023-09-14 LAB — COMPREHENSIVE METABOLIC PANEL WITH GFR
ALT: 10 U/L (ref 0–44)
AST: 31 U/L (ref 15–41)
Albumin: 3.7 g/dL (ref 3.5–5.0)
Alkaline Phosphatase: 94 U/L (ref 38–126)
Anion gap: 12 (ref 5–15)
BUN: 27 mg/dL — ABNORMAL HIGH (ref 8–23)
CO2: 22 mmol/L (ref 22–32)
Calcium: 8.8 mg/dL — ABNORMAL LOW (ref 8.9–10.3)
Chloride: 105 mmol/L (ref 98–111)
Creatinine, Ser: 1.88 mg/dL — ABNORMAL HIGH (ref 0.61–1.24)
GFR, Estimated: 36 mL/min — ABNORMAL LOW (ref >60)
Glucose, Bld: 148 mg/dL — ABNORMAL HIGH (ref 70–99)
Potassium: 4.2 mmol/L (ref 3.5–5.1)
Sodium: 139 mmol/L (ref 135–145)
Total Bilirubin: 0.4 mg/dL (ref 0.0–1.2)
Total Protein: 6.6 g/dL (ref 6.5–8.1)

## 2023-09-14 LAB — CBG MONITORING, ED: Glucose-Capillary: 146 mg/dL — ABNORMAL HIGH (ref 70–99)

## 2023-09-14 MED ORDER — OXYCODONE HCL 10 MG PO TABS: 10.0000 mg | ORAL_TABLET | Freq: Three times a day (TID) | ORAL | 0 refills | Status: DC | PRN

## 2023-09-14 NOTE — Assessment & Plan Note (Signed)
 Chronic opioid therapy for pain management. Current regimen includes oxycodone  10 mg every 8 hours. Pain management referral not yet contacted. Refill oxycodone  prescription for one month. Re-route pain management referral to ensure follow-up. Advise follow-up with pain management specialist for long-term opioid management.

## 2023-09-14 NOTE — ED Provider Notes (Signed)
 [place] [facility] DE[facility]MENT AT [facility] Provider Note   CSN: [id number] Arrival date & time: 09/14/23  1127     Patient presents with: Weakness   Michael Bender is a 78 y.o. male.   HPI Patient reports that about 7 to 10 days ago he started noticing a decreased capacity to walk a longer distance.  Patient reports he typically walks about a half a mile out and back.  He reports now over about the past week he can only walk about 1/4 mile and has to walk back home.  Patient reports that he started to get numbness and loss of sensation in his legs and both legs go weak.  He denies it is any more left or right.  He has no associated pain in the legs or feet and no associated pain in the back.  He reports after certain amount of exertion he to start to feel like the legs are getting numb and weak.  He has not fallen because of this.  He denies he has to sit down and take a rest.  But he has changed the amount of walking.  (Notably patient denies falls or gait instability.  Review of EMR indicates patient has had prior presentations with frequent falls and gait instability with prior MRIs of the brain and cervical spine done in January of this year and October last year).  Patient is treated with oxycodone  for chronic back pain.  Patient has had multiple prior surgeries.  He denies that he had any ongoing problems of weakness, gait instability or numbness after his last lumbar surgery approximately 10 years ago.  Patient reports he had a primary care visit today and reported symptoms of lower extremity weakness and numbness and was referred to the emergency department for further evaluation.    Prior to Admission medications   Medication Sig Start Date End Date Taking? Authorizing Provider  amitriptyline  (ELAVIL ) 50 MG tablet Take 1 tablet (50 mg total) by mouth at bedtime. 03/22/23   [name], MD  amLODipine  (NORVASC ) 5 MG tablet Take 1 tablet (5 mg total) by mouth daily.  04/25/23   Cheryle Page, MD  bisoprolol  (ZEBETA ) 5 MG tablet Take 1 tablet (5 mg total) by mouth daily. 07/20/23   [name], NP  cefadroxil  (DURICEF) 500 MG capsule Take 500 mg by mouth 2 (two) times daily. 07/14/23   [provider]  cyclobenzaprine  (FLEXERIL ) 10 MG tablet Take 1 tablet (10 mg total) by mouth 3 (three) times daily as needed. 03/22/23   [name], MD  FeFum-FePoly-FA-B Cmp-C-Biot (INTEGRA PLUS ) CAPS Take 1 capsule by mouth daily. 06/29/23   Sherrod Sherrod, MD  hydrALAZINE  (APRESOLINE ) 50 MG tablet Take 1 tablet (50 mg total) by mouth every 8 (eight) hours. 04/24/23   Cheryle Page, MD  ipratropium-albuterol  (DUONEB) 0.5-2.5 (3) MG/3ML SOLN Take 3 mLs by nebulization every 4 (four) hours as needed. 04/19/23   [name], MD  isosorbide  mononitrate (IMDUR ) 60 MG 24 hr tablet Take 1 tablet (60 mg total) by mouth daily. 04/25/23   Cheryle Page, MD  Oxycodone  HCl 10 MG TABS Take 1 tablet (10 mg total) by mouth every 8 (eight) hours as needed. 09/14/23   [name], MD  pantoprazole  (PROTONIX ) 40 MG tablet Take 1 tablet (40 mg total) by mouth daily. 05/03/23   [name], MD  polyethylene glycol (MIRALAX  / GLYCOLAX ) 17 g packet Take 17 g by mouth daily as needed for  moderate constipation. 11/02/22   Cheryle Page, MD  Respiratory Therapy Supplies (NEBULIZER/TUBING/MOUTHPIECE) KIT 1 each by Does not apply route every 4 (four) hours as needed. 04/19/23   [name], MD  Respiratory Therapy Supplies (NEBULIZER/TUBING/MOUTHPIECE) KIT 1 each by Does not apply route every 4 (four) hours as needed. 04/19/23   [name], MD  rosuvastatin  (CRESTOR ) 40 MG tablet TAKE 1 TABLET BY MOUTH DAILY. REPLACES ATORVASTATIN  (STOP ATORVASTATIN  IF STILL TAKING) 04/13/23   [name], MD  sacubitril -valsartan  (ENTRESTO ) 24-26 MG Take 1 tablet by mouth 2 (two) times daily. 09/01/23   [name], NP  sodium zirconium cyclosilicate  (LOKELMA ) 10 g PACK  packet Take 10 g by mouth daily. 09/01/23   [name], NP  SUMAtriptan  (IMITREX ) 50 MG tablet TAKE 1 TABLET (50 MG TOTAL) BY MOUTH DAILY. MAY REPEAT IN 2 HOURS IF HEADACHE PERSISTS OR RECURS. 11/18/22   [name], MD    Allergies: Nubain [nalbuphine hcl]    Review of Systems  Updated Vital Signs BP (!) 185/51   Pulse (!) 51   Temp 98.4 F (36.9 C) (Oral)   Resp 12   Ht 5' 11 (1.803 m)   Wt 70.3 kg   SpO2 100%   BMI 21.62 kg/m   Physical Exam Constitutional:      Comments: Patient is alert.  He is interactive.  No respiratory distress.  He can follow commands appropriately.  Patient is giving history but patient's son also is assisting.  Patient appears to have some mild\moderate cognitive impairment consistent with dementia.   HENT:     Head: Normocephalic and atraumatic.     Nose: Nose normal.     Mouth/Throat:     Pharynx: Oropharynx is clear.  Eyes:     Extraocular Movements: Extraocular movements intact.     Pupils: Pupils are equal, round, and reactive to light.  Neck:     Comments: No midline C-spine tenderness Cardiovascular:     Rate and Rhythm: Normal rate and regular rhythm.     Comments: 2/6 to 3/6 systolic ejection murmur.  Distant heart sounds. Pulmonary:     Comments: Respiratory distress.  Breath sounds are soft to the mid lung fields and bases.  Scattered expiratory wheeze. Abdominal:     General: There is no distension.     Palpations: Abdomen is soft.     Tenderness: There is no abdominal tenderness. There is no guarding.  Musculoskeletal:     Comments: No peripheral edema.  No wounds of the lower extremities.  Feet are warm and dry.  Right foot has 3+ dorsalis pedis pulse left foot has 2+ dorsalis pedis pulse.  Calves are soft and pliable.  No joint effusions.  Skin:    General: Skin is warm and dry.  Neurological:     Comments: Patient is alert.  He is answering questions appropriately.  He is not situationally confused.  He seems to  have some reasonable recall but relies on his son for more accurate and concise explanation of recent events.  Verbal interaction suggest mild to moderate dementia.  Patient can follow commands without difficulty.  No expressive or receptive aphasia. Upper extremity motor strength 5\5.  Lower extremities patient can elevate each lower extremity off of the bed independently and hold against significant downward pressure.  Patient has 5\5 dorsiflexion plantarflexion strength.  Patient endorses positive sensation to light touch bilateral lower extremities.  I asked the patient to stand and ambulate, he is  able to sit up in the stretcher go to a standing position and ambulate down the hall.  In transitioning from stretcher to standing patient has slightly broad-based gait.  With turns and starts stops slight hesitation and instability but no appearance of imminent fall at this time patient ambulated about 60 feet down the hall and back to the stretcher without feeling symptomatic and without appearance of imminent fatigue or lower extremity dysfunction.   Psychiatric:        Mood and Affect: Mood normal.     (all labs ordered are listed, but only abnormal results are displayed) Labs Reviewed  COMPREHENSIVE METABOLIC PANEL WITH GFR - Abnormal; Notable for the following components:      Result Value   Glucose, Bld 148 (*)    BUN 27 (*)    Creatinine, Ser 1.88 (*)    Calcium  8.8 (*)    GFR, Estimated 36 (*)    All other components within normal limits  CBC - Abnormal; Notable for the following components:   RBC 3.24 (*)    Hemoglobin 9.9 (*)    HCT 30.0 (*)    Platelets 115 (*)    All other components within normal limits  CBG MONITORING, ED - Abnormal; Notable for the following components:   Glucose-Capillary 146 (*)    All other components within normal limits  URINALYSIS, ROUTINE W REFLEX MICROSCOPIC    EKG: EKG Interpretation Date/Time:  Thursday September 14 2023 11:42:10 EDT Ventricular  Rate:  57 PR Interval:  193 QRS Duration:  94 QT Interval:  496 QTC Calculation: 483 R Axis:   49  Text Interpretation: Sinus rhythm slightly lower voltage than previous, no ischemic appearance, otherwise no significant change Confirmed by Armenta Canning [phone]) on 09/14/2023 1:03:06 PM  Radiology: No results found.   Procedures   Medications Ordered in the ED - No data to display                                  Medical Decision Making Amount and/or Complexity of Data Reviewed Labs: ordered.   Patient presents as outlined with recommendation from outpatient provider for ED evaluation.  Patient identifies gait dysfunction approximately 7 to 10 days.  Review of EMR indicates the patient has had 2 visits with EMR brain and cervical spine for frequent falls and gait dysfunction over the past 6 months.  Patient does not identify any lower back pain abdominal pain chest pain or lower extremity pain in association with his symptoms.  On examination patient has good peripheral pulses with slight differential for strong pulse on the right foot and palpable but slightly less on the left.  Clinically patient does not differentiate any symptoms that suggest claudication.  Patient's examination for motor strength and gait do not suggest emergent cord compression or injury.  Patient has had multiple back surgeries and has had lumbar spine surgery.  At this time I do not see indication for emergent MRI.  For optimal study patient probably needs input from spine surgery for recommendations but currently no indication that this is needed emergently.  This can be arranged in outpatient basis.  Reviewing MRIs shows patient has had multiple chronic microhemorrhages of the cerebellum and occipital lobes.  Also old left basal ganglia and thalamic small vessel infarcts and chronic small vessel ischemia.  I reviewed this finding with the patient and his son at bedside.  These may be contributing  factors to  incremental and indolent problems with cognitive function and gait.  This might also be multifactorial with peripheral neuropathy.  All of these conditions at this time appear to be chronic and stable.  I do feel patient would benefit from consultation with neurology on outpatient basis for what is may be neurologic gait dysfunction and cerebellar chronic microhemorrhages.  All of the findings on today's evaluation appear chronic and at this time patient does not have any immediate electrolyte derangement, symptomatic anemia, signs of infection or emergent vascular condition.  Patient has chronic significant comorbid conditions.  He is established with cardiothoracic surgery for severe aortic stenosis and possibly a vegetation on the leaflet.  No symptoms today suggest there is been any acute change in this condition.  Appropriate to continue working with cardiothoracic surgery and PCP.     Final diagnoses:  Weakness of both lower extremities  Severe comorbid illness  Cerebral microvascular disease  Neurologic gait dysfunction    ED Discharge Orders          Ordered    Ambulatory referral to Neurology       Comments: An appointment is requested in approximately: 2 weeks   09/14/23 1247               Armenta Canning, MD 09/14/23 1308

## 2023-09-14 NOTE — ED Triage Notes (Signed)
 Pt reports:  Leg weakness Started about 7-10 days ago Sent by PCP Leg Numbness Started about 7-10 days ago Loss of Bowel control Started about 7-10 days ago

## 2023-09-14 NOTE — Assessment & Plan Note (Signed)
 Suspected acute cauda equina syndrome   Recent onset of bowel incontinence and progressive leg weakness suggests cauda equina syndrome. No recent falls or injuries reported. Urgent evaluation is needed to prevent permanent nerve damage. Advise immediate ER visit for urgent MRI of the lumbar spine. Cancel outpatient MRI orders due to urgency.

## 2023-09-14 NOTE — Discharge Instructions (Signed)
 1.  Discuss possible scheduling for MRI with your doctor.  At this time you do not have weakness on physical exam of the lower extremities and you are walking at what appears to be your baseline gait.  You do not have pain in the back.  This study can be scheduled to further evaluate for any possible development of problems in your lower spine.  You have already had multiple surgeries and may need special imaging for adequate evaluation of the spine. 2.  You have areas on prior MRIs that show small older stroke areas.  This is not uncommon in people who have known vascular disease.  This may be contributing to your incremental development of problems with balance and gait.  I recommend an evaluation with a neurologist.  A referral to neurology has been included in your discharge instructions.

## 2023-09-14 NOTE — Assessment & Plan Note (Signed)
 Anemia, unspecified type   Recent low hemoglobin levels noted. Potential link to aortic valve regurgitation causing hemolysis. Discussed possibility of valve replacement to address anemia if related to valve dysfunction. Order blood work to assess hemoglobin levels and rule out worsening anemia. Discuss potential impact of valve replacement on anemia with cardiologist.

## 2023-09-14 NOTE — ED Notes (Signed)
 DC paperwork given and verbally understood.

## 2023-09-14 NOTE — ED Notes (Signed)
 CBG 146

## 2023-09-14 NOTE — Patient Instructions (Signed)
 It was a pleasure seeing you today! Your health and satisfaction are our top priorities.  Bender Cone, MD  VISIT SUMMARY: During your visit, we discussed your worsening leg weakness and loss of bowel control, which may indicate a serious condition called cauda equina syndrome. We also reviewed your ongoing issues with aortic valve disease, chronic kidney disease, chronic low back pain, anemia, and chronic opioid therapy.  YOUR PLAN: -SUSPECTED ACUTE CAUDA EQUINA SYNDROME: Cauda equina syndrome is a serious condition where the bundle of nerves at the end of the spinal cord is compressed, leading to symptoms like leg weakness and loss of bowel control. You need to go to the emergency room immediately for an urgent MRI of your lower back to prevent permanent nerve damage.  -AORTIC VALVE REGURGITATION WITH PROGRESSIVE HEART FAILURE: Aortic valve regurgitation is when the valve between your heart and aorta doesn't close properly, causing blood to flow backward. This can lead to heart failure. We discussed the risks and benefits of valve replacement surgery, and you should have a follow-up echocardiogram within six months. A consultation with a cardiologist is recommended to discuss the timing of the surgery.  -CHRONIC KIDNEY DISEASE STAGE 3B: Chronic kidney disease stage 3B means your kidneys are moderately to severely damaged. This can worsen if your heart function declines. We will monitor your kidney function with upcoming blood work and discuss the potential impact of heart valve surgery on your kidneys with your cardiologist.  -CHRONIC LOW BACK PAIN WITH BILATERAL SCIATICA: Chronic low back pain with sciatica involves pain that radiates down your legs. Your recent symptoms raise concern for cauda equina syndrome. You need to go to the emergency room immediately for an urgent MRI of your lower back.  -ANEMIA, UNSPECIFIED TYPE: Anemia is a condition where you don't have enough healthy red blood cells.  This could be linked to your aortic valve regurgitation. We will order blood work to check your hemoglobin levels and discuss the potential impact of valve replacement on your anemia with your cardiologist.  -CHRONIC OPIOID THERAPY AND OPIOID DEPENDENCE: You are on chronic opioid therapy for pain management, taking oxycodone  10 mg every 8 hours. We will refill your prescription for one month and ensure your referral to a pain management specialist is followed up for long-term management.  INSTRUCTIONS: Please go to the emergency room immediately for an urgent MRI of your lower back to evaluate for cauda equina syndrome. Follow up with a cardiologist to discuss the timing of your valve replacement surgery and its potential impact on your kidney function and anemia. We will also monitor your kidney function with upcoming blood work. Ensure you follow up with a pain management specialist for long-term opioid management.  Your Providers PCP: Michael Bender MATSU, MD,  [phone]) Referring Provider: Cone Bender MATSU, MD,  [phone]) Care Team Provider: Evonnie Asberry RAMAN, DO,  [phone]) Care Team Provider: Fleeta Rothman, Jomarie SAILOR, MD,  [phone]) Care Team Provider: Avram Lupita BRAVO, MD,  [phone]) Care Team Provider: Okey Vina GAILS, MD,  [phone]) Care Team Provider: [name], NP,  [phone])  NEXT STEPS: [x]  Early Intervention: Schedule sooner appointment, call our on-call services, or go to emergency room if there is any significant Increase in pain or discomfort New or worsening symptoms Sudden or severe changes in your health [x]  Flexible Follow-Up: We recommend a No follow-ups on file. for optimal routine care. This allows for progress monitoring and treatment adjustments. [x]  Preventive Care: Schedule your annual preventive care visit! It's  typically covered by insurance and helps identify potential health issues early. [x]  Lab & X-ray Appointments: Incomplete  tests scheduled today, or call to schedule. X-rays: [facility] (M-F, 8:30am-noon or 1pm-5pm). [x]  Medical Information Release: Sign a release form at front desk to obtain relevant medical information we don't have.  MAKING THE MOST OF OUR FOCUSED 20 MINUTE APPOINTMENTS: [x]   Clearly state your top concerns at the beginning of the visit to focus our discussion [x]   If you anticipate you will need more time, please inform the front desk during scheduling - we can book multiple appointments in the same week. [x]   If you have transportation problems- use our convenient video appointments or ask about transportation support. [x]   We can get down to business faster if you use MyChart to update information before the visit and submit non-urgent questions before your visit. Thank you for taking the time to provide details through MyChart.  Let our nurse know and she can import this information into your encounter documents.  Arrival and Wait Times: [x]   Arriving on time ensures that everyone receives prompt attention. [x]   Early morning (8a) and afternoon (1p) appointments tend to have shortest wait times. [x]   Unfortunately, we cannot delay appointments for late arrivals or hold slots during phone calls.  Getting Answers and Following Up [x]   Simple Questions & Concerns: For quick questions or basic follow-up after your visit, reach us  at [phone] or MyChart messaging. [x]   Complex Concerns: If your concern is more complex, scheduling an appointment might be best. Discuss this with the staff to find the most suitable option. [x]   Lab & Imaging Results: We'll contact you directly if results are abnormal or you don't use MyChart. Most normal results will be on MyChart within 2-3 business days, with a review message from Dr. Jesus. Haven't heard back in 2 weeks? Need results sooner? Contact us  at [phone]. [x]   Referrals: Our referral coordinator will manage specialist  referrals. The specialist's office should contact you within 2 weeks to schedule an appointment. Call us  if you haven't heard from them after 2 weeks.  Staying Connected [x]   MyChart: Activate your MyChart for the fastest way to access results and message us . See the last page of this paperwork for instructions on how to activate.  Bring to Your Next Appointment [x]   Medications: Please bring all your medication bottles to your next appointment to ensure we have an accurate record of your prescriptions. [x]   Health Diaries: If you're monitoring any health conditions at home, keeping a diary of your readings can be very helpful for discussions at your next appointment.  Billing [x]   X-ray & Lab Orders: These are billed by separate companies. Contact the invoicing company directly for questions or concerns. [x]   Visit Charges: Discuss any billing inquiries with our administrative services team.  Your Satisfaction Matters [x]   Share Your Experience: We strive for your satisfaction! If you have any complaints, or preferably compliments, please let Dr. Jesus know directly or contact our Practice Administrators, Manuelita Rubin or [employer], by asking at the front desk.   Reviewing Your Records [x]   Review this early draft of your clinical encounter notes below and the final encounter summary tomorrow on MyChart after its been completed.  All orders placed so far are visible here: Rapidly progressive weakness  Uncomplicated opioid dependence (HCC)  Other chronic pain  Chronic bilateral low back pain with bilateral sciatica -     oxyCODONE   HCl; Take 1 tablet (10 mg total) by mouth every 8 (eight) hours as needed.  Dispense: 90 tablet; Refill: 0  Neck pain -     oxyCODONE  HCl; Take 1 tablet (10 mg total) by mouth every 8 (eight) hours as needed.  Dispense: 90 tablet; Refill: 0  High risk medication use -     oxyCODONE  HCl; Take 1 tablet (10 mg total) by mouth every 8 (eight) hours as  needed.  Dispense: 90 tablet; Refill: 0  Fecal incontinence alternating with constipation  Loss of bowel control with constipation  Aortic valve insufficiency due to infection  Anemia, unspecified type

## 2023-09-14 NOTE — Progress Notes (Signed)
 ====================================  [facility]: [id number]   --  Virtual Video Medical Office Visit --  Patient: Michael Bender      Age: 78 y.o.       Sex:  male  Date:   09/14/2023 Today's Healthcare Provider: Bernardino KANDICE Cone, MD  ====================================    Chief Complaint/Reason For Visit: Medical Management of Chronic Issues, Follow-up, frailty, and Dizziness  Chart reviewed: has GERD; CONSTIPATION, CHRONIC; Primary hypertension; Bilateral leg pain; Esophageal dysphagia; Insomnia; Peripheral arterial disease (HCC); History of CVA (cerebrovascular accident); Anemia; HLD (hyperlipidemia); COPD (chronic obstructive pulmonary disease) (HCC); Iron deficiency anemia; Duodenal stenosis; Opioid dependence (HCC); Atherosclerosis of aorta (HCC); High risk medication use; Gynecomastia, male; Chronic bilateral low back pain with bilateral sciatica; Neck pain; Memory loss; Frequent falls; Nonintractable headache; Balance problems; Chronic heart failure with preserved ejection fraction (HFpEF, >= 50%) (HCC); Orthostatic hypotension; Stage 3 chronic kidney disease (HCC); Elevated BUN; 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.  Chart reviewed:  has a past medical history of Acute combined systolic and diastolic heart failure (HCC) (96/77/7974), Acute on chronic diastolic CHF (congestive heart failure) (HCC) (04/20/2023), AKI (acute kidney injury) (HCC) (10/28/2022), Arthritis, Balance problems (02/21/2023), Blood transfusion, Cachexia (HCC) (05/03/2022), Cervical discitis (11/10/2022), Chronic back pain, COPD (chronic obstructive pulmonary disease) (HCC), Disturbance of skin sensation (05/22/2018), Effusion of right knee (02/21/2023), Effusion of right knee joint (01/16/2023), Elevated troponin (04/22/2023), Essential  hypertension (03/05/2007), Finger pain, left (11/08/2022), Gastric erosion, Gastritis and gastroduodenitis, Gastrointestinal hemorrhage with melena (11/20/2018), GIB (gastrointestinal bleeding) (07/18/2019), Hardware complicating wound infection (HCC) (11/10/2022), Headache(784.0), Hemorrhoids, internal, High risk medication use (03/03/2022), History of duodenal ulcer, History of fusion of cervical spine (03/03/2022), History of lumbar fusion (03/03/2022), History of upper gastrointestinal bleeding (03/03/2022), HLD (hyperlipidemia), Hypertension, Hypokalemia (04/22/2023), Hypomagnesemia (04/22/2023), Infected blister of left index finger (11/07/2022), Intractable pain (03/03/2022), Loss of weight, MSSA bacteremia (11/07/2022), Neck rigidity, Nocturia, PAIN, CHRONIC NEC (10/06/2006), Pneumonia, Prostate disease, Right sided temporal headache (05/22/2018), S/P cervical spinal fusion (03/03/2022), Senile ecchymosis (01/21/2019), Septic arthritis of wrist, left (HCC) (11/08/2022), Septic infrapatellar bursitis of right knee (11/07/2022), Staphylococcal arthritis of left wrist (HCC) (11/07/2022), Staphylococcal arthritis of right knee (HCC) (11/08/2022), Syncope (11/05/2022), and Underweight on examination (05/03/2022). Discussed the use of AI scribe software for clinical note transcription with the patient, who gave verbal consent to proceed.  History of Present Illness 78 year old male with aortic valve disease who presents with worsening leg weakness and loss of bowel control. He is accompanied by his son.  Over the past week, he has experienced worsening leg weakness and loss of bowel control. His legs feel weak during walks, and he has had episodes of bowel incontinence, leading to accidents. He denies recent falls or injuries to his back, although he engages in activities such as wood splitting, which may have contributed to his symptoms.  He has a history of aortic valve disease, with a recommendation for  valve replacement surgery due to a leaky aortic valve. The valve was damaged by a previous infection.  Chronic pain is managed with oxycodone  10 mg every eight hours. He is awaiting a referral to a pain management specialist. He has not had any falls since a hospitalization for sepsis in September-October of the previous year.  His weight has stabilized at 155 pounds, up from a low of 120 pounds in November 2024. He maintains physical activity through walking  and wood splitting, which he does not consider exercise but is physically demanding.  He has a history of back problems, which may be contributing to his current symptoms. No bladder control issues reported.   Medications reviewed Current Outpatient Medications on File Prior to Visit  Medication Sig   amitriptyline  (ELAVIL ) 50 MG tablet Take 1 tablet (50 mg total) by mouth at bedtime.   amLODipine  (NORVASC ) 5 MG tablet Take 1 tablet (5 mg total) by mouth daily.   bisoprolol  (ZEBETA ) 5 MG tablet Take 1 tablet (5 mg total) by mouth daily.   cefadroxil  (DURICEF) 500 MG capsule Take 500 mg by mouth 2 (two) times daily.   cyclobenzaprine  (FLEXERIL ) 10 MG tablet Take 1 tablet (10 mg total) by mouth 3 (three) times daily as needed.   FeFum-FePoly-FA-B Cmp-C-Biot (INTEGRA PLUS ) CAPS Take 1 capsule by mouth daily.   hydrALAZINE  (APRESOLINE ) 50 MG tablet Take 1 tablet (50 mg total) by mouth every 8 (eight) hours.   ipratropium-albuterol  (DUONEB) 0.5-2.5 (3) MG/3ML SOLN Take 3 mLs by nebulization every 4 (four) hours as needed.   isosorbide  mononitrate (IMDUR ) 60 MG 24 hr tablet Take 1 tablet (60 mg total) by mouth daily.   pantoprazole  (PROTONIX ) 40 MG tablet Take 1 tablet (40 mg total) by mouth daily.   polyethylene glycol (MIRALAX  / GLYCOLAX ) 17 g packet Take 17 g by mouth daily as needed for moderate constipation.   Respiratory Therapy Supplies (NEBULIZER/TUBING/MOUTHPIECE) KIT 1 each by Does not apply route every 4 (four) hours as needed.    Respiratory Therapy Supplies (NEBULIZER/TUBING/MOUTHPIECE) KIT 1 each by Does not apply route every 4 (four) hours as needed.   rosuvastatin  (CRESTOR ) 40 MG tablet TAKE 1 TABLET BY MOUTH DAILY. REPLACES ATORVASTATIN  (STOP ATORVASTATIN  IF STILL TAKING)   sacubitril -valsartan  (ENTRESTO ) 24-26 MG Take 1 tablet by mouth 2 (two) times daily.   sodium zirconium cyclosilicate  (LOKELMA ) 10 g PACK packet Take 10 g by mouth daily.   SUMAtriptan  (IMITREX ) 50 MG tablet TAKE 1 TABLET (50 MG TOTAL) BY MOUTH DAILY. MAY REPEAT IN 2 HOURS IF HEADACHE PERSISTS OR RECURS.   No current facility-administered medications on file prior to visit.   Medications Discontinued During This Encounter  Medication Reason   empagliflozin  (JARDIANCE ) 10 MG TABS tablet    doxycycline  (VIBRA -TABS) 100 MG tablet Completed Course   Oxycodone  HCl 10 MG TABS Reorder        Virtual Physical Exam Weight and muscle mass healthy, walking around with a live bird perched on his shoulder  General Appearance:  Well Developed, Well Nourished, No Acute Distress by Limited Video Assessment Pulmonary:  No Respiratory Distress Apparent. Normal Work of Breathing.   Neurological:  Awake, Alert. No Obvious Focal Neurological Deficits or Cognitive Impairments.  Sensorium Seems Unclouded. Psychiatric:  Appropriate Mood, Pleasant Demeanor, Calm, Articulate, Good Mood        Results for orders placed or performed during the hospital encounter of 09/14/23  Comprehensive metabolic panel  Result Value Ref Range   Sodium 139 135 - 145 mmol/L   Potassium 4.2 3.5 - 5.1 mmol/L   Chloride 105 98 - 111 mmol/L   CO2 22 22 - 32 mmol/L   Glucose, Bld 148 (H) 70 - 99 mg/dL   BUN 27 (H) 8 - 23 mg/dL   Creatinine, Ser 8.11 (H) 0.61 - 1.24 mg/dL   Calcium  8.8 (L) 8.9 - 10.3 mg/dL   Total Protein 6.6 6.5 - 8.1 g/dL   Albumin 3.7 3.5 - 5.0 g/dL  AST 31 15 - 41 U/L   ALT 10 0 - 44 U/L   Alkaline Phosphatase 94 38 - 126 U/L   Total Bilirubin 0.4 0.0  - 1.2 mg/dL   GFR, Estimated 36 (L) >60 mL/min   Anion gap 12 5 - 15  CBC  Result Value Ref Range   WBC 5.9 4.0 - 10.5 K/uL   RBC 3.24 (L) 4.22 - 5.81 MIL/uL   Hemoglobin 9.9 (L) 13.0 - 17.0 g/dL   HCT 69.9 (L) 60.9 - 47.9 %   MCV 92.6 80.0 - 100.0 fL   MCH 30.6 26.0 - 34.0 pg   MCHC 33.0 30.0 - 36.0 g/dL   RDW 85.9 88.4 - 84.4 %   Platelets 115 (L) 150 - 400 K/uL   nRBC 0.0 0.0 - 0.2 %  CBG monitoring, ED  Result Value Ref Range   Glucose-Capillary 146 (H) 70 - 99 mg/dL   Admission on 91/85/7974, Discharged on 09/14/2023  Component Date Value   Sodium 09/14/2023 139    Potassium 09/14/2023 4.2    Chloride 09/14/2023 105    CO2 09/14/2023 22    Glucose, Bld 09/14/2023 148 (H)    BUN 09/14/2023 27 (H)    Creatinine, Ser 09/14/2023 1.88 (H)    Calcium  09/14/2023 8.8 (L)    Total Protein 09/14/2023 6.6    Albumin 09/14/2023 3.7    AST 09/14/2023 31    ALT 09/14/2023 10    Alkaline Phosphatase 09/14/2023 94    Total Bilirubin 09/14/2023 0.4    GFR, Estimated 09/14/2023 36 (L)    Anion gap 09/14/2023 12    WBC 09/14/2023 5.9    RBC 09/14/2023 3.24 (L)    Hemoglobin 09/14/2023 9.9 (L)    HCT 09/14/2023 30.0 (L)    MCV 09/14/2023 92.6    MCH 09/14/2023 30.6    MCHC 09/14/2023 33.0    RDW 09/14/2023 14.0    Platelets 09/14/2023 115 (L)    nRBC 09/14/2023 0.0    Glucose-Capillary 09/14/2023 146 (H)   Results Follow-Up on 08/30/2023  Component Date Value   Glucose 08/30/2023 140 (H)    BUN 08/30/2023 35 (H)    Creatinine, Ser 08/30/2023 2.21 (H)    eGFR 08/30/2023 30 (L)    BUN/Creatinine Ratio 08/30/2023 16    Sodium 08/30/2023 137    Potassium 08/30/2023 5.4 (H)    Chloride 08/30/2023 103    CO2 08/30/2023 20    Calcium  08/30/2023 8.2 (L)   Office Visit on 08/29/2023  Component Date Value   Glucose 08/29/2023 106 (H)    BUN 08/29/2023 32 (H)    Creatinine, Ser 08/29/2023 2.10 (H)    eGFR 08/29/2023 32 (L)    BUN/Creatinine Ratio 08/29/2023 15    Sodium  08/29/2023 135    Potassium 08/29/2023 5.4 (H)    Chloride 08/29/2023 101    CO2 08/29/2023 21    Calcium  08/29/2023 8.4 (L)    WBC 08/29/2023 6.6    RBC 08/29/2023 3.28 (L)    Hemoglobin 08/29/2023 10.3 (L)    Hematocrit 08/29/2023 31.0 (L)    MCV 08/29/2023 95    MCH 08/29/2023 31.4    MCHC 08/29/2023 33.2    RDW 08/29/2023 13.9    Platelets 08/29/2023 164   Office Visit on 08/14/2023  Component Date Value   WBC 08/14/2023 8.1    RBC 08/14/2023 4.96    Hemoglobin 08/14/2023 14.8    Hematocrit 08/14/2023 46.5    MCV 08/14/2023 94  MCH 08/14/2023 29.8    MCHC 08/14/2023 31.8    RDW 08/14/2023 14.1    Platelets 08/14/2023 160    Neutrophils 08/14/2023 50    Lymphs 08/14/2023 32    Monocytes 08/14/2023 8    Eos 08/14/2023 9    Basos 08/14/2023 1    Neutrophils Absolute 08/14/2023 4.1    Lymphocytes Absolute 08/14/2023 2.6    Monocytes Absolute 08/14/2023 0.7    EOS (ABSOLUTE) 08/14/2023 0.7 (H)    Basophils Absolute 08/14/2023 0.1    Immature Granulocytes 08/14/2023 0    Immature Grans (Abs) 08/14/2023 0.0    Glucose 08/14/2023 94    BUN 08/14/2023 34 (H)    Creatinine, Ser 08/14/2023 2.18 (H)    eGFR 08/14/2023 30 (L)    BUN/Creatinine Ratio 08/14/2023 16    Sodium 08/14/2023 136    Potassium 08/14/2023 5.5 (H)    Chloride 08/14/2023 106    CO2 08/14/2023 18 (L)    Calcium  08/14/2023 8.0 (L)    Total Protein 08/14/2023 6.3    Albumin 08/14/2023 3.6 (L)    Globulin, Total 08/14/2023 2.7    Bilirubin Total 08/14/2023 <0.2    Alkaline Phosphatase 08/14/2023 100    AST 08/14/2023 40    ALT 08/14/2023 20    Retic Ct Pct 08/14/2023 1.1    Total Iron Binding Capac* 08/14/2023 203 (L)    UIBC 08/14/2023 151    Iron 08/14/2023 52    Iron Saturation 08/14/2023 26    Ferritin 08/14/2023 216    Vitamin B-12 08/14/2023 348    Folate 08/14/2023 9.7    Albumin ELP 08/14/2023 3.5    Alpha 1 08/14/2023 0.2    Alpha 2 08/14/2023 0.6    Beta 08/14/2023 0.8    Gamma  Globulin 08/14/2023 1.2    M-Spike, % 08/14/2023 Not Observed    Globulin, Total 08/14/2023 2.8    A/G Ratio 08/14/2023 1.3    Please Note: 08/14/2023 Comment    Interpretation: 08/14/2023 Comment    Haptoglobin 08/14/2023 172    Coombs', Direct 08/14/2023 Negative    Erythropoietin  08/14/2023 11.8    PTH 08/14/2023 28    Phosphorus 08/14/2023 4.9 (H)    Cholesterol 08/14/2023 93    Triglycerides 08/14/2023 58.0    HDL 08/14/2023 46.00    VLDL 08/14/2023 11.6    LDL Cholesterol 08/14/2023 36    Total CHOL/HDL Ratio 08/14/2023 2    NonHDL 08/14/2023 47.33   Admission on 07/24/2023, Discharged on 07/24/2023  Component Date Value   Est EF 07/24/2023 50 - 55%    Specimen Description 07/24/2023 BLOOD LEFT ARM    Special Requests 07/24/2023 BOTTLES DRAWN AEROBIC AND ANAEROBIC Blood Culture adequate volume    Culture 07/24/2023                     Value:NO GROWTH 5 DAYS Performed at [facility], [address] [place], [place] [address]    Report Status 07/24/2023 07/29/2023 FINAL    Specimen Description 07/24/2023 BLOOD RIGHT ARM    Special Requests 07/24/2023 BOTTLES DRAWN AEROBIC AND ANAEROBIC Blood Culture adequate volume    Culture 07/24/2023                     Value:NO GROWTH 5 DAYS Performed at [facility], [address] [place], [place] [address]    Report Status 07/24/2023 07/29/2023 FINAL   Lab on 07/21/2023  Component Date Value   WBC  07/21/2023 4.6    RBC 07/21/2023 3.25 (L)    Hemoglobin 07/21/2023 9.7 (L)    HCT 07/21/2023 28.8 (L)    MCV 07/21/2023 88.8    MCHC 07/21/2023 33.7    RDW 07/21/2023 14.6    Platelets 07/21/2023 129.0 (L)    Neutrophils Relative % 07/21/2023 58.9    Lymphocytes Relative 07/21/2023 23.3    Monocytes Relative 07/21/2023 8.4    Eosinophils Relative 07/21/2023 9.0 (H)    Basophils Relative 07/21/2023 0.4    Neutro Abs 07/21/2023 2.7    Lymphs Abs 07/21/2023 1.1    Monocytes Absolute 07/21/2023 0.4    Eosinophils  Absolute 07/21/2023 0.4    Basophils Absolute 07/21/2023 0.0   Office Visit on 07/20/2023  Component Date Value   Glucose 07/20/2023 119 (H)    BUN 07/20/2023 30 (H)    Creatinine, Ser 07/20/2023 1.85 (H)    eGFR 07/20/2023 37 (L)    BUN/Creatinine Ratio 07/20/2023 16    Sodium 07/20/2023 135    Potassium 07/20/2023 5.2    Chloride 07/20/2023 100    CO2 07/20/2023 21    Calcium  07/20/2023 8.4 (L)   Office Visit on 07/14/2023  Component Date Value   Amphetamines, Urine 07/21/2023 Negative    Cannabinoid Quant, Ur 07/21/2023 Negative    Cocaine (Metab.) 07/21/2023 Negative    OPIATE QUANTITATIVE URINE 07/21/2023 Negative    PCP Quant, Ur 07/21/2023 Negative   Appointment on 06/29/2023  Component Date Value   TSH 06/29/2023 2.810    Total Protein ELP 06/29/2023 6.8    Albumin ELP 06/29/2023 3.3    Alpha-1-Globulin 06/29/2023 0.3    Alpha-2-Globulin 06/29/2023 0.8    Beta Globulin 06/29/2023 1.0    Gamma Globulin 06/29/2023 1.4    M-Spike, % 06/29/2023 Not Observed    Globulin, Total 06/29/2023 3.5    A/G Ratio 06/29/2023 0.9    Comment 06/29/2023 Comment    SPEP Interpretation 06/29/2023 Comment    Vitamin B-12 06/29/2023 245    Folate 06/29/2023 13.6    LDH 06/29/2023 209 (H)    Iron 06/29/2023 71    TIBC 06/29/2023 259    Saturation Ratios 06/29/2023 27    UIBC 06/29/2023 188    Ferritin 06/29/2023 268    Sodium 06/29/2023 135    Potassium 06/29/2023 4.2    Chloride 06/29/2023 102    CO2 06/29/2023 26    Glucose, Bld 06/29/2023 155 (H)    BUN 06/29/2023 27 (H)    Creatinine 06/29/2023 1.77 (H)    Calcium  06/29/2023 8.9    Total Protein 06/29/2023 7.4    Albumin 06/29/2023 4.0    AST 06/29/2023 26    ALT 06/29/2023 10    Alkaline Phosphatase 06/29/2023 110    Total Bilirubin 06/29/2023 0.4    GFR, Estimated 06/29/2023 39 (L)    Anion gap 06/29/2023 7    WBC Count 06/29/2023 6.6    RBC 06/29/2023 3.50 (L)    Hemoglobin 06/29/2023 10.2 (L)    HCT 06/29/2023  31.0 (L)    MCV 06/29/2023 88.6    MCH 06/29/2023 29.1    MCHC 06/29/2023 32.9    RDW 06/29/2023 14.2    Platelet Count 06/29/2023 165    nRBC 06/29/2023 0.0    Neutrophils Relative % 06/29/2023 59    Neutro Abs 06/29/2023 4.0    Lymphocytes Relative 06/29/2023 23    Lymphs Abs 06/29/2023 1.5    Monocytes Relative 06/29/2023 7    Monocytes Absolute 06/29/2023 0.4  Eosinophils Relative 06/29/2023 10    Eosinophils Absolute 06/29/2023 0.7 (H)    Basophils Relative 06/29/2023 1    Basophils Absolute 06/29/2023 0.0    Immature Granulocytes 06/29/2023 0    Abs Immature Granulocytes 06/29/2023 0.01   Lab on 06/21/2023  Component Date Value   WBC 06/21/2023 6.0    RBC 06/21/2023 3.44 (L)    Hemoglobin 06/21/2023 10.2 (L)    HCT 06/21/2023 30.4 (L)    MCV 06/21/2023 88.4    MCHC 06/21/2023 33.6    RDW 06/21/2023 15.2    Platelets 06/21/2023 178.0    Neutrophils Relative % 06/21/2023 46.5    Lymphocytes Relative 06/21/2023 29.5    Monocytes Relative 06/21/2023 8.8    Eosinophils Relative 06/21/2023 14.6 (H)    Basophils Relative 06/21/2023 0.6    Neutro Abs 06/21/2023 2.8    Lymphs Abs 06/21/2023 1.8    Monocytes Absolute 06/21/2023 0.5    Eosinophils Absolute 06/21/2023 0.9 (H)    Basophils Absolute 06/21/2023 0.0    Glucose, Bld 06/21/2023 98    BUN 06/21/2023 32 (H)    Creat 06/21/2023 1.87 (H)    BUN/Creatinine Ratio 06/21/2023 17    Sodium 06/21/2023 133 (L)    Potassium 06/21/2023 4.0    Chloride 06/21/2023 101    CO2 06/21/2023 22    Calcium  06/21/2023 8.8    Total Protein 06/21/2023 7.0    Albumin 06/21/2023 3.6    Globulin 06/21/2023 3.4    AG Ratio 06/21/2023 1.1    Total Bilirubin 06/21/2023 0.3    Alkaline phosphatase (AP* 06/21/2023 82    AST 06/21/2023 20    ALT 06/21/2023 6 (L)   There may be more visits with results that are not included.  No image results found. ECHO TEE Result Date: 07/24/2023    TRANSESOPHOGEAL ECHO REPORT   Patient Name:   Michael Bender Date of Exam: 07/24/2023 Medical Rec #:  [id number]        Height:       71.0 in Accession #:    [id number]       Weight:       150.0 lb Date of Birth:  1945/10/27       BSA:          1.866 m Patient Age:    18 years         BP:           142/44 mmHg Patient Gender: M                HR:           50 bpm. Exam Location:  Outpatient Procedure: Transesophageal Echo, Cardiac Doppler, Color Doppler and 3D Echo            (Both Spectral and Color Flow Doppler were utilized during            procedure). Indications:     aortic insufficiency  History:         Patient has prior history of Echocardiogram examinations, most                  recent 04/21/2023. Risk Factors:Hypertension.  Sonographer:     Philomena Daring Sonographer#2:   Tinnie Barefoot RDCS Referring Phys:  [id number] MADISON L FOUNTAIN Diagnosing Phys: Redell Shallow MD PROCEDURE: After discussion of the risks and benefits of a TEE, an informed consent was obtained from the patient. The transesophogeal probe was passed without difficulty through the esophogus  of the patient. Imaged were obtained with the patient in a left lateral decubitus position. Sedation performed by different physician. The patient was monitored while under deep sedation. Anesthestetic sedation was provided intravenously by Anesthesiology: 300mg  of Propofol . Image quality was excellent. The patient developed no complications during the procedure.  IMPRESSIONS  1. Oscillating, linear density (0.7-0.8 cm) on left coronary cusp of aortic valve concerning for vegetation; severe AI (best seen on transgastric views).  2. Left ventricular ejection fraction, by estimation, is 50 to 55%. The left ventricle has low normal function. The left ventricle has no regional wall motion abnormalities. The left ventricular internal cavity size was mildly dilated.  3. Right ventricular systolic function is mildly reduced. The right ventricular size is normal.  4. Left atrial size was moderately dilated.  No left atrial/left atrial appendage thrombus was detected.  5. The mitral valve is normal in structure. Mild mitral valve regurgitation.  6. The aortic valve is tricuspid. Aortic valve regurgitation is severe.  7. There is Moderate (Grade III) plaque involving the descending aorta.  8. 3D performed of the aortic valve. FINDINGS  Left Ventricle: Left ventricular ejection fraction, by estimation, is 50 to 55%. The left ventricle has low normal function. The left ventricle has no regional wall motion abnormalities. The left ventricular internal cavity size was mildly dilated. Right Ventricle: The right ventricular size is normal. Right ventricular systolic function is mildly reduced. Left Atrium: Left atrial size was moderately dilated. No left atrial/left atrial appendage thrombus was detected. Right Atrium: Right atrial size was normal in size. Pericardium: There is no evidence of pericardial effusion. Mitral Valve: The mitral valve is normal in structure. Mild mitral valve regurgitation. Tricuspid Valve: The tricuspid valve is normal in structure. Tricuspid valve regurgitation is trivial. Aortic Valve: The aortic valve is tricuspid. Aortic valve regurgitation is severe. Pulmonic Valve: The pulmonic valve was normal in structure. Pulmonic valve regurgitation is not visualized. Aorta: The aortic root is normal in size and structure. There is moderate (Grade III) plaque involving the descending aorta. IAS/Shunts: No atrial level shunt detected by color flow Doppler. Additional Comments: Oscillating, linear density (0.7-0.8 cm) on left coronary cusp of aortic valve concerning for vegetation; severe AI (best seen on transgastric views).  AORTA Ao Root diam: 3.50 cm Ao Asc diam:  3.80 cm Redell Shallow MD Electronically signed by Redell Shallow MD Signature Date/Time: 07/24/2023/12:17:18 PM    Final    EP STUDY Result Date: 07/24/2023 See surgical note for result. ECHO TEE Result Date: 07/24/2023    TRANSESOPHOGEAL  ECHO REPORT   Patient Name:   Michael Bender Date of Exam: 07/24/2023 Medical Rec #:  [id number]        Height:       71.0 in Accession #:    [id number]       Weight:       150.0 lb Date of Birth:  1946-01-30       BSA:          1.866 m Patient Age:    51 years         BP:           142/44 mmHg Patient Gender: M                HR:           50 bpm. Exam Location:  Outpatient Procedure: Transesophageal Echo, Cardiac Doppler, Color Doppler and 3D Echo            (  Both Spectral and Color Flow Doppler were utilized during            procedure). Indications:     aortic insufficiency  History:         Patient has prior history of Echocardiogram examinations, most                  recent 04/21/2023. Risk Factors:Hypertension.  Sonographer:     Philomena Daring Sonographer#2:   Tinnie Barefoot RDCS Referring Phys:  [id number] MADISON L FOUNTAIN Diagnosing Phys: Redell Shallow MD PROCEDURE: After discussion of the risks and benefits of a TEE, an informed consent was obtained from the patient. The transesophogeal probe was passed without difficulty through the esophogus of the patient. Imaged were obtained with the patient in a left lateral decubitus position. Sedation performed by different physician. The patient was monitored while under deep sedation. Anesthestetic sedation was provided intravenously by Anesthesiology: 300mg  of Propofol . Image quality was excellent. The patient developed no complications during the procedure.  IMPRESSIONS  1. Oscillating, linear density (0.7-0.8 cm) on left coronary cusp of aortic valve concerning for vegetation; severe AI (best seen on transgastric views).  2. Left ventricular ejection fraction, by estimation, is 50 to 55%. The left ventricle has low normal function. The left ventricle has no regional wall motion abnormalities. The left ventricular internal cavity size was mildly dilated.  3. Right ventricular systolic function is mildly reduced. The right ventricular size is normal.  4. Left  atrial size was moderately dilated. No left atrial/left atrial appendage thrombus was detected.  5. The mitral valve is normal in structure. Mild mitral valve regurgitation.  6. The aortic valve is tricuspid. Aortic valve regurgitation is severe.  7. There is Moderate (Grade III) plaque involving the descending aorta.  8. 3D performed of the aortic valve. FINDINGS  Left Ventricle: Left ventricular ejection fraction, by estimation, is 50 to 55%. The left ventricle has low normal function. The left ventricle has no regional wall motion abnormalities. The left ventricular internal cavity size was mildly dilated. Right Ventricle: The right ventricular size is normal. Right ventricular systolic function is mildly reduced. Left Atrium: Left atrial size was moderately dilated. No left atrial/left atrial appendage thrombus was detected. Right Atrium: Right atrial size was normal in size. Pericardium: There is no evidence of pericardial effusion. Mitral Valve: The mitral valve is normal in structure. Mild mitral valve regurgitation. Tricuspid Valve: The tricuspid valve is normal in structure. Tricuspid valve regurgitation is trivial. Aortic Valve: The aortic valve is tricuspid. Aortic valve regurgitation is severe. Pulmonic Valve: The pulmonic valve was normal in structure. Pulmonic valve regurgitation is not visualized. Aorta: The aortic root is normal in size and structure. There is moderate (Grade III) plaque involving the descending aorta. IAS/Shunts: No atrial level shunt detected by color flow Doppler. Additional Comments: Oscillating, linear density (0.7-0.8 cm) on left coronary cusp of aortic valve concerning for vegetation; severe AI (best seen on transgastric views).  AORTA Ao Root diam: 3.50 cm Ao Asc diam:  3.80 cm Redell Shallow MD Electronically signed by Redell Shallow MD Signature Date/Time: 07/24/2023/12:17:18 PM    Final    EP STUDY Result Date: 07/24/2023 See surgical note for result.      Assessment  & Plan Rapidly progressive weakness Loss of bowel control with constipation Chronic bilateral low back pain with bilateral sciatica Fecal incontinence alternating with constipation Other chronic pain Suspected acute cauda equina syndrome   Recent onset of bowel incontinence and progressive leg weakness  suggests cauda equina syndrome. No recent falls or injuries reported. Urgent evaluation is needed to prevent permanent nerve damage. Advise immediate ER visit for urgent MRI of the lumbar spine. Cancel outpatient MRI orders due to urgency. Uncomplicated opioid dependence (HCC) High risk medication use Neck pain Chronic opioid therapy for pain management. Current regimen includes oxycodone  10 mg every 8 hours. Pain management referral not yet contacted. Refill oxycodone  prescription for one month. Re-route pain management referral to ensure follow-up. Advise follow-up with pain management specialist for long-term opioid management. 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 Recent echocardiogram shows significant valve damage. Potential for worsening heart function and kidney damage if untreated. Discussed risks and benefits of valve replacement surgery. He is hesitant about surgery due to recovery concerns. Discussed potential for heart failure progression leading to dialysis if untreated. Order repeat echocardiogram within six months to monitor valve progression. Recommend consultation with cardiologist for second opinion on valve replacement timing. Discuss potential for valve replacement surgery with cardiologist and thoracic surgeon.  Chronic kidney disease stage 3B   Chronic kidney disease stage 3B with potential for worsening due to heart failure. Discussed risk of dialysis if heart function declines further. Monitor kidney function with upcoming blood work.  Discuss potential impact of heart valve surgery on kidney function with cardiologist. Anemia, unspecified type Anemia, unspecified type   Recent low hemoglobin levels noted. Potential link to aortic valve regurgitation causing hemolysis. Discussed possibility of valve replacement to address anemia if related to valve dysfunction. Order blood work to assess hemoglobin levels and rule out worsening anemia. Discuss potential impact of valve replacement on anemia with cardiologist.      Orders Placed During this Encounter:   Meds ordered this encounter  Medications   Oxycodone  HCl 10 MG TABS    Sig: Take 1 tablet (10 mg total) by mouth every 8 (eight) hours as needed.    Dispense:  90 tablet    Refill:  0    Ok to fill now    Treatment plan discussed and reviewed in detail. Explained medication safety and potential side effects.  Answered all patient questions and confirmed understanding and comfort with the plan. Encouraged patient to contact our office if they have any questions or concerns.  Agreed on patient coming for a sooner office visit if symptoms worsen, persist, or new symptoms develop. Discussed precautions in case of needing to visit the Emergency Department.    ----------------------------------------------------- Attestation:  Today's Healthcare Provider Bernardino KANDICE Cone, MD was located at office at [facility] [address], [place] [address].  The patient was located at home. All video encounter participant identities and locations confirmed visually and verbally.Today's Telemedicine visit was conducted via synchronous Video after consent for telemedicine was obtained:  Video connection was never lost    This document was transcribed and resynthesized, in part, by artificial intelligence ([employer]) using HIPAA-compliant recording of the clinical interaction;   We have discussed the our use of AI scribe software for clinical note transcription with  the patient, who has given verbal consent to proceed.

## 2023-09-14 NOTE — ED Notes (Signed)
 Pt aware of the need for a urine... Pt unable to currently provide a sample.[email or web address]

## 2023-09-14 NOTE — Assessment & Plan Note (Signed)
 Aortic valve regurgitation with progressive heart failure   Progressive aortic valve regurgitation with associated heart failure symptoms. Recent echocardiogram shows significant valve damage. Potential for worsening heart function and kidney damage if untreated. Discussed risks and benefits of valve replacement surgery. He is hesitant about surgery due to recovery concerns. Discussed potential for heart failure progression leading to dialysis if untreated. Order repeat echocardiogram within six months to monitor valve progression. Recommend consultation with cardiologist for second opinion on valve replacement timing. Discuss potential for valve replacement surgery with cardiologist and thoracic surgeon.  Chronic kidney disease stage 3B   Chronic kidney disease stage 3B with potential for worsening due to heart failure. Discussed risk of dialysis if heart function declines further. Monitor kidney function with upcoming blood work. Discuss potential impact of heart valve surgery on kidney function with cardiologist.

## 2023-09-18 ENCOUNTER — Telehealth: Payer: Self-pay

## 2023-09-18 NOTE — Telephone Encounter (Signed)
 Transition Care Management Follow-up Telephone Call Date of discharge and from where: 09/14/23 [facility] How have you been since you were released from the hospital? Same Any questions or concerns? No  Items Reviewed: Did the pt receive and understand the discharge instructions provided? Yes  Medications obtained and verified? Yes  Other?   Any new allergies since your discharge? No  Dietary orders reviewed? No Do you have support at home? Yes   Home Care and Equipment/Supplies: Were home health services ordered? no If so, what is the name of the agency?   Has the agency set up a time to come to the patient's home? not applicable Were any new equipment or medical supplies ordered?  No What is the name of the medical supply agency?  Were you able to get the supplies/equipment? not applicable Do you have any questions related to the use of the equipment or supplies? No  Functional Questionnaire: (I = Independent and D = Dependent) ADLs: I  Bathing/Dressing- I  Meal Prep- I  Eating- I  Maintaining continence- I  Transferring/Ambulation- I  Managing Meds- I  Follow up appointments reviewed:  PCP Hospital f/u appt confirmed? Msg sent to front office to schedule patient for ED follow up within 14 days of discharge.  Specialist Hospital f/u appt confirmed?  Are transportation arrangements needed? No  If their condition worsens, is the pt aware to call PCP or go to the Emergency Dept.? Yes Was the patient provided with contact information for the PCP's office or ED? Yes Was to pt encouraged to call back with questions or concerns? Yes

## 2023-09-25 ENCOUNTER — Ambulatory Visit (INDEPENDENT_AMBULATORY_CARE_PROVIDER_SITE_OTHER): Admitting: Internal Medicine

## 2023-09-25 ENCOUNTER — Encounter: Payer: Self-pay | Admitting: Internal Medicine

## 2023-09-25 ENCOUNTER — Ambulatory Visit (INDEPENDENT_AMBULATORY_CARE_PROVIDER_SITE_OTHER)

## 2023-09-25 VITALS — BP 156/52 | HR 68 | Temp 98.0°F | Ht 71.0 in | Wt 156.8 lb

## 2023-09-25 DIAGNOSIS — I351 Nonrheumatic aortic (valve) insufficiency: Secondary | ICD-10-CM

## 2023-09-25 DIAGNOSIS — M5441 Lumbago with sciatica, right side: Secondary | ICD-10-CM

## 2023-09-25 DIAGNOSIS — Z981 Arthrodesis status: Secondary | ICD-10-CM | POA: Diagnosis not present

## 2023-09-25 DIAGNOSIS — M5442 Lumbago with sciatica, left side: Secondary | ICD-10-CM | POA: Diagnosis not present

## 2023-09-25 DIAGNOSIS — G959 Disease of spinal cord, unspecified: Secondary | ICD-10-CM | POA: Diagnosis not present

## 2023-09-25 DIAGNOSIS — M4312 Spondylolisthesis, cervical region: Secondary | ICD-10-CM | POA: Diagnosis not present

## 2023-09-25 DIAGNOSIS — G8929 Other chronic pain: Secondary | ICD-10-CM

## 2023-09-25 DIAGNOSIS — M5030 Other cervical disc degeneration, unspecified cervical region: Secondary | ICD-10-CM

## 2023-09-25 DIAGNOSIS — R29898 Other symptoms and signs involving the musculoskeletal system: Secondary | ICD-10-CM | POA: Diagnosis not present

## 2023-09-25 DIAGNOSIS — M4802 Spinal stenosis, cervical region: Secondary | ICD-10-CM | POA: Diagnosis not present

## 2023-09-25 DIAGNOSIS — M50322 Other cervical disc degeneration at C5-C6 level: Secondary | ICD-10-CM | POA: Diagnosis not present

## 2023-09-25 DIAGNOSIS — F1120 Opioid dependence, uncomplicated: Secondary | ICD-10-CM | POA: Diagnosis not present

## 2023-09-25 DIAGNOSIS — M549 Dorsalgia, unspecified: Secondary | ICD-10-CM | POA: Diagnosis not present

## 2023-09-25 DIAGNOSIS — M47812 Spondylosis without myelopathy or radiculopathy, cervical region: Secondary | ICD-10-CM | POA: Diagnosis not present

## 2023-09-25 NOTE — Progress Notes (Signed)
 ==============================  [facility]: [phone]   -- Medical Office Visit --  Patient: Michael Bender      Age: 78 y.o.       Sex:  male  Date:   09/25/2023 Today's Healthcare Provider: Bernardino KANDICE Cone, MD  ==============================   Chief Complaint: Hospitalization Follow-up Orelia to ed not in the hospital )  Discussed the use of AI scribe software for clinical note transcription with the patient, who gave verbal consent to proceed.  History of Present Illness 78 year old male with a history of spinal cord surgery who presents with leg weakness and bowel control issues.  He has been experiencing leg weakness primarily from the knees down for approximately one week. He is able to walk about half a mile consistently, six to seven days a week, but notes that his right leg is larger than his left, which he attributes to a childhood injury. No recent falls or injuries have occurred preceding the onset of symptoms.  About a week ago, he experienced a loss of bowel control, prompting an emergency room visit on September 14, 2023. Since then, there have been no further episodes of bowel or bladder incontinence, although he did have a close call recently where he had to urgently use the bathroom while showering.  He has a history of spinal cord surgery, including cervical and lumbar fusions, and reports persistent back pain, which he describes as steady and not worsening over the past few weeks. He is currently taking oxycodone  for pain management. No fever, which is relevant given his history of infection concerns. Background Reviewed: Problem List: has GERD; CONSTIPATION, CHRONIC; Primary hypertension; Bilateral leg pain; Esophageal dysphagia; Insomnia; Peripheral arterial disease (HCC); History of CVA (cerebrovascular accident); Anemia; HLD (hyperlipidemia); COPD (chronic obstructive pulmonary disease) (HCC); Iron deficiency anemia; Duodenal  stenosis; Opioid dependence (HCC); Atherosclerosis of aorta (HCC); High risk medication use; Gynecomastia, male; Chronic bilateral low back pain with bilateral sciatica; Neck pain; Memory loss; Frequent falls; Nonintractable headache; Balance problems; Chronic heart failure with preserved ejection fraction (HFpEF, >= 50%) (HCC); Orthostatic hypotension; Stage 3 chronic kidney disease (HCC); Elevated BUN; 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 Past Medical History:  has a past medical history of Acute combined systolic and diastolic heart failure (HCC) (96/77/7974), Acute on chronic diastolic CHF (congestive heart failure) (HCC) (04/20/2023), AKI (acute kidney injury) (HCC) (10/28/2022), Arthritis, Balance problems (02/21/2023), Blood transfusion, Cachexia (HCC) (05/03/2022), Cervical discitis (11/10/2022), Chronic back pain, COPD (chronic obstructive pulmonary disease) (HCC), Disturbance of skin sensation (05/22/2018), Effusion of right knee (02/21/2023), Effusion of right knee joint (01/16/2023), Elevated troponin (04/22/2023), Essential hypertension (03/05/2007), Finger pain, left (11/08/2022), Gastric erosion, Gastritis and gastroduodenitis, Gastrointestinal hemorrhage with melena (11/20/2018), GIB (gastrointestinal bleeding) (07/18/2019), Hardware complicating wound infection (HCC) (11/10/2022), Headache(784.0), Hemorrhoids, internal, High risk medication use (03/03/2022), History of duodenal ulcer, History of fusion of cervical spine (03/03/2022), History of lumbar fusion (03/03/2022), History of upper gastrointestinal bleeding (03/03/2022), HLD (hyperlipidemia), Hypertension, Hypokalemia (04/22/2023), Hypomagnesemia (04/22/2023), Infected blister of left index finger (11/07/2022), Intractable pain (03/03/2022), Loss of weight, MSSA bacteremia (11/07/2022), Neck rigidity,  Nocturia, PAIN, CHRONIC NEC (10/06/2006), Pneumonia, Prostate disease, Right sided temporal headache (05/22/2018), S/P cervical spinal fusion (03/03/2022), Senile ecchymosis (01/21/2019), Septic arthritis of wrist, left (HCC) (11/08/2022), Septic infrapatellar bursitis of right knee (11/07/2022), Staphylococcal arthritis of left wrist (HCC) (11/07/2022), Staphylococcal arthritis of right knee (HCC) (11/08/2022), Syncope (  11/05/2022), and Underweight on examination (05/03/2022). Past Surgical History:   has a past surgical history that includes Cervical fusion (1992); Spinal fusion (05/06/11); Colonoscopy; Esophagogastroduodenoscopy (07/20/2011); Savory dilation (07/20/2011); biopsy (07/19/2019); Esophagogastroduodenoscopy (egd) with propofol  (N/A, 07/19/2019); I & D extremity (Left, 11/09/2022); and TRANSESOPHAGEAL ECHOCARDIOGRAM (N/A, 07/24/2023). Social History:   reports that he quit smoking about 4 years ago. His smoking use included cigarettes. He started smoking about 44 years ago. He has never used smokeless tobacco. He reports that he does not currently use alcohol after a past usage of about 2.0 standard drinks of alcohol per week. He reports that he does not use drugs. Family History:  family history includes Heart attack (age of onset: 75) in his mother; Heart disease in his mother; Pancreatic cancer in his brother; Pancreatic cancer (age of onset: 18) in his father. Allergies:  is allergic to nubain [nalbuphine hcl].   Medication Reconciliation: Current Outpatient Medications on File Prior to Visit  Medication Sig   amitriptyline  (ELAVIL ) 50 MG tablet Take 1 tablet (50 mg total) by mouth at bedtime.   amLODipine  (NORVASC ) 5 MG tablet Take 1 tablet (5 mg total) by mouth daily.   bisoprolol  (ZEBETA ) 5 MG tablet Take 1 tablet (5 mg total) by mouth daily.   cefadroxil  (DURICEF) 500 MG capsule Take 500 mg by mouth 2 (two) times daily.   cyclobenzaprine  (FLEXERIL ) 10 MG tablet Take 1 tablet (10 mg total)  by mouth 3 (three) times daily as needed.   FeFum-FePoly-FA-B Cmp-C-Biot (INTEGRA PLUS ) CAPS Take 1 capsule by mouth daily.   hydrALAZINE  (APRESOLINE ) 50 MG tablet Take 1 tablet (50 mg total) by mouth every 8 (eight) hours.   ipratropium-albuterol  (DUONEB) 0.5-2.5 (3) MG/3ML SOLN Take 3 mLs by nebulization every 4 (four) hours as needed.   isosorbide  mononitrate (IMDUR ) 60 MG 24 hr tablet Take 1 tablet (60 mg total) by mouth daily.   Oxycodone  HCl 10 MG TABS Take 1 tablet (10 mg total) by mouth every 8 (eight) hours as needed.   pantoprazole  (PROTONIX ) 40 MG tablet Take 1 tablet (40 mg total) by mouth daily.   polyethylene glycol (MIRALAX  / GLYCOLAX ) 17 g packet Take 17 g by mouth daily as needed for moderate constipation.   Respiratory Therapy Supplies (NEBULIZER/TUBING/MOUTHPIECE) KIT 1 each by Does not apply route every 4 (four) hours as needed.   Respiratory Therapy Supplies (NEBULIZER/TUBING/MOUTHPIECE) KIT 1 each by Does not apply route every 4 (four) hours as needed.   rosuvastatin  (CRESTOR ) 40 MG tablet TAKE 1 TABLET BY MOUTH DAILY. REPLACES ATORVASTATIN  (STOP ATORVASTATIN  IF STILL TAKING)   sacubitril -valsartan  (ENTRESTO ) 24-26 MG Take 1 tablet by mouth 2 (two) times daily.   sodium zirconium cyclosilicate  (LOKELMA ) 10 g PACK packet Take 10 g by mouth daily.   SUMAtriptan  (IMITREX ) 50 MG tablet TAKE 1 TABLET (50 MG TOTAL) BY MOUTH DAILY. MAY REPEAT IN 2 HOURS IF HEADACHE PERSISTS OR RECURS.   No current facility-administered medications on file prior to visit.  There are no discontinued medications.   Physical Exam:    09/25/2023   12:50 PM 09/14/2023   12:45 PM 09/14/2023   11:34 AM  Vitals with BMI  Height 5' 11  5' 11  Weight 156 lbs 13 oz  155 lbs  BMI 21.88  21.63  Systolic 160 185 792  Diastolic 52 51 57  Pulse 68 51 63  Vital signs reviewed.  Nursing notes reviewed. Weight trend reviewed. Physical Activity: Insufficiently Active (10/13/2022)   Exercise Vital Sign  Days of Exercise per Week: 3 days    Minutes of Exercise per Session: 30 min   General Appearance:  No acute distress appreciable.   Well-groomed, healthy-appearing male.  Well proportioned with no abnormal fat distribution.  Good muscle tone. Pulmonary:  Normal work of breathing at rest, no respiratory distress apparent. SpO2: 96 %  Musculoskeletal: All extremities are intact.  Neurological:  Awake, alert, oriented, and engaged.  No obvious focal neurological deficits or cognitive impairments.  Sensorium seems unclouded.   Speech is clear and coherent with logical content. Psychiatric:  Appropriate mood, pleasant and cooperative demeanor, thoughtful and engaged during the exam  Verbalized to patient: Physical Exam NEUROLOGICAL: Cranial nerves grossly intact. Sensation in legs intact. Gait and balance normal. Motor strength intact, no weakness.  There is loss of muscle in legs.   Results:    04/19/2023   10:47 AM 03/22/2023    9:40 AM 02/17/2023   11:42 AM 01/17/2023   10:13 AM  PHQ 2/9 Scores  PHQ - 2 Score 0 0 2 0  PHQ- 9 Score  0 3    Admission on 09/14/2023, Discharged on 09/14/2023  Component Date Value Ref Range Status   Sodium 09/14/2023 139  135 - 145 mmol/L Final   Potassium 09/14/2023 4.2  3.5 - 5.1 mmol/L Final   Chloride 09/14/2023 105  98 - 111 mmol/L Final   CO2 09/14/2023 22  22 - 32 mmol/L Final   Glucose, Bld 09/14/2023 148 (H)  70 - 99 mg/dL Final   BUN 91/85/7974 27 (H)  8 - 23 mg/dL Final   Creatinine, Ser 09/14/2023 1.88 (H)  0.61 - 1.24 mg/dL Final   Calcium  09/14/2023 8.8 (L)  8.9 - 10.3 mg/dL Final   Total Protein 91/85/7974 6.6  6.5 - 8.1 g/dL Final   Albumin 91/85/7974 3.7  3.5 - 5.0 g/dL Final   AST 91/85/7974 31  15 - 41 U/L Final   ALT 09/14/2023 10  0 - 44 U/L Final   Alkaline Phosphatase 09/14/2023 94  38 - 126 U/L Final   Total Bilirubin 09/14/2023 0.4  0.0 - 1.2 mg/dL Final   GFR, Estimated 09/14/2023 36 (L)  >60 mL/min Final   Anion gap  09/14/2023 12  5 - 15 Final   WBC 09/14/2023 5.9  4.0 - 10.5 K/uL Final   RBC 09/14/2023 3.24 (L)  4.22 - 5.81 MIL/uL Final   Hemoglobin 09/14/2023 9.9 (L)  13.0 - 17.0 g/dL Final   HCT 91/85/7974 30.0 (L)  39.0 - 52.0 % Final   MCV 09/14/2023 92.6  80.0 - 100.0 fL Final   MCH 09/14/2023 30.6  26.0 - 34.0 pg Final   MCHC 09/14/2023 33.0  30.0 - 36.0 g/dL Final   RDW 91/85/7974 14.0  11.5 - 15.5 % Final   Platelets 09/14/2023 115 (L)  150 - 400 K/uL Final   nRBC 09/14/2023 0.0  0.0 - 0.2 % Final   Glucose-Capillary 09/14/2023 146 (H)  70 - 99 mg/dL Final  Results Follow-Up on 08/30/2023  Component Date Value Ref Range Status   Glucose 08/30/2023 140 (H)  70 - 99 mg/dL Final   BUN 92/69/7974 35 (H)  8 - 27 mg/dL Final   Creatinine, Ser 08/30/2023 2.21 (H)  0.76 - 1.27 mg/dL Final   eGFR 92/69/7974 30 (L)  >59 mL/min/1.73 Final   BUN/Creatinine Ratio 08/30/2023 16  10 - 24 Final   Sodium 08/30/2023 137  134 - 144 mmol/L Final   Potassium  08/30/2023 5.4 (H)  3.5 - 5.2 mmol/L Final   Chloride 08/30/2023 103  96 - 106 mmol/L Final   CO2 08/30/2023 20  20 - 29 mmol/L Final   Calcium  08/30/2023 8.2 (L)  8.6 - 10.2 mg/dL Final  Office Visit on 08/29/2023  Component Date Value Ref Range Status   Glucose 08/29/2023 106 (H)  70 - 99 mg/dL Final   BUN 92/70/7974 32 (H)  8 - 27 mg/dL Final   Creatinine, Ser 08/29/2023 2.10 (H)  0.76 - 1.27 mg/dL Final   eGFR 92/70/7974 32 (L)  >59 mL/min/1.73 Final   BUN/Creatinine Ratio 08/29/2023 15  10 - 24 Final   Sodium 08/29/2023 135  134 - 144 mmol/L Final   Potassium 08/29/2023 5.4 (H)  3.5 - 5.2 mmol/L Final   Chloride 08/29/2023 101  96 - 106 mmol/L Final   CO2 08/29/2023 21  20 - 29 mmol/L Final   Calcium  08/29/2023 8.4 (L)  8.6 - 10.2 mg/dL Final   WBC 92/70/7974 6.6  3.4 - 10.8 x10E3/uL Final   RBC 08/29/2023 3.28 (L)  4.14 - 5.80 x10E6/uL Final   Hemoglobin 08/29/2023 10.3 (L)  13.0 - 17.7 g/dL Final   Hematocrit 92/70/7974 31.0 (L)  37.5 -  51.0 % Final   MCV 08/29/2023 95  79 - 97 fL Final   MCH 08/29/2023 31.4  26.6 - 33.0 pg Final   MCHC 08/29/2023 33.2  31.5 - 35.7 g/dL Final   RDW 92/70/7974 13.9  11.6 - 15.4 % Final   Platelets 08/29/2023 164  150 - 450 x10E3/uL Final  Office Visit on 08/14/2023  Component Date Value Ref Range Status   WBC 08/14/2023 8.1  3.4 - 10.8 x10E3/uL Final   RBC 08/14/2023 4.96  4.14 - 5.80 x10E6/uL Final   Hemoglobin 08/14/2023 14.8  13.0 - 17.7 g/dL Final   Hematocrit 92/85/7974 46.5  37.5 - 51.0 % Final   MCV 08/14/2023 94  79 - 97 fL Final   MCH 08/14/2023 29.8  26.6 - 33.0 pg Final   MCHC 08/14/2023 31.8  31.5 - 35.7 g/dL Final   RDW 92/85/7974 14.1  11.6 - 15.4 % Final   Platelets 08/14/2023 160  150 - 450 x10E3/uL Final   Neutrophils 08/14/2023 50  Not Estab. % Final   Lymphs 08/14/2023 32  Not Estab. % Final   Monocytes 08/14/2023 8  Not Estab. % Final   Eos 08/14/2023 9  Not Estab. % Final   Basos 08/14/2023 1  Not Estab. % Final   Neutrophils Absolute 08/14/2023 4.1  1.4 - 7.0 x10E3/uL Final   Lymphocytes Absolute 08/14/2023 2.6  0.7 - 3.1 x10E3/uL Final   Monocytes Absolute 08/14/2023 0.7  0.1 - 0.9 x10E3/uL Final   EOS (ABSOLUTE) 08/14/2023 0.7 (H)  0.0 - 0.4 x10E3/uL Final   Basophils Absolute 08/14/2023 0.1  0.0 - 0.2 x10E3/uL Final   Immature Granulocytes 08/14/2023 0  Not Estab. % Final   Immature Grans (Abs) 08/14/2023 0.0  0.0 - 0.1 x10E3/uL Final   Glucose 08/14/2023 94  70 - 99 mg/dL Final   BUN 92/85/7974 34 (H)  8 - 27 mg/dL Final   Creatinine, Ser 08/14/2023 2.18 (H)  0.76 - 1.27 mg/dL Final   eGFR 92/85/7974 30 (L)  >59 mL/min/1.73 Final   BUN/Creatinine Ratio 08/14/2023 16  10 - 24 Final   Sodium 08/14/2023 136  134 - 144 mmol/L Final   Potassium 08/14/2023 5.5 (H)  3.5 - 5.2 mmol/L Final  Chloride 08/14/2023 106  96 - 106 mmol/L Final   CO2 08/14/2023 18 (L)  20 - 29 mmol/L Final   Calcium  08/14/2023 8.0 (L)  8.6 - 10.2 mg/dL Final   Total Protein  08/14/2023 6.3  6.0 - 8.5 g/dL Final   Albumin 92/85/7974 3.6 (L)  3.8 - 4.8 g/dL Final   Globulin, Total 08/14/2023 2.7  1.5 - 4.5 g/dL Final   Bilirubin Total 08/14/2023 <0.2  0.0 - 1.2 mg/dL Final   Alkaline Phosphatase 08/14/2023 100  44 - 121 IU/L Final   AST 08/14/2023 40  0 - 40 IU/L Final   ALT 08/14/2023 20  0 - 44 IU/L Final   Retic Ct Pct 08/14/2023 1.1  0.6 - 2.6 % Final   Total Iron Binding Capacity 08/14/2023 203 (L)  250 - 450 ug/dL Final   UIBC 92/85/7974 151  111 - 343 ug/dL Final   Iron 92/85/7974 52  38 - 169 ug/dL Final   Iron Saturation 08/14/2023 26  15 - 55 % Final   Ferritin 08/14/2023 216  30 - 400 ng/mL Final   Vitamin B-12 08/14/2023 348  232 - 1,245 pg/mL Final   Folate 08/14/2023 9.7  >3.0 ng/mL Final   Albumin ELP 08/14/2023 3.5  2.9 - 4.4 g/dL Final   Alpha 1 92/85/7974 0.2  0.0 - 0.4 g/dL Final   Alpha 2 92/85/7974 0.6  0.4 - 1.0 g/dL Final   Beta 92/85/7974 0.8  0.7 - 1.3 g/dL Final   Gamma Globulin 08/14/2023 1.2  0.4 - 1.8 g/dL Final   M-Spike, % 92/85/7974 Not Observed  Not Observed g/dL Final   Globulin, Total 08/14/2023 2.8  2.2 - 3.9 g/dL Final   A/G Ratio 92/85/7974 1.3  0.7 - 1.7 Final   Please Note: 08/14/2023 Comment   Final   Interpretation: 08/14/2023 Comment   Final   Haptoglobin 08/14/2023 172  34 - 355 mg/dL Final   Coombs', Direct 08/14/2023 Negative  Negative Final   Erythropoietin  08/14/2023 11.8  2.6 - 18.5 mIU/mL Final   PTH 08/14/2023 28  15 - 65 pg/mL Final   Phosphorus 08/14/2023 4.9 (H)  2.8 - 4.1 mg/dL Final   Cholesterol 92/85/7974 93  0 - 200 mg/dL Final   Triglycerides 92/85/7974 58.0  0.0 - 149.0 mg/dL Final   HDL 92/85/7974 46.00  >39.00 mg/dL Final   VLDL 92/85/7974 11.6  0.0 - 40.0 mg/dL Final   LDL Cholesterol 08/14/2023 36  0 - 99 mg/dL Final   Total CHOL/HDL Ratio 08/14/2023 2   Final   NonHDL 08/14/2023 47.33   Final  Admission on 07/24/2023, Discharged on 07/24/2023  Component Date Value Ref Range Status    Est EF 07/24/2023 50 - 55%   Final   Specimen Description 07/24/2023 BLOOD LEFT ARM   Final   Special Requests 07/24/2023 BOTTLES DRAWN AEROBIC AND ANAEROBIC Blood Culture adequate volume   Final   Culture 07/24/2023    Final                   Value:NO GROWTH 5 DAYS Performed at [facility], [address], [place], [place] [address]    Report Status 07/24/2023 07/29/2023 FINAL   Final   Specimen Description 07/24/2023 BLOOD RIGHT ARM   Final   Special Requests 07/24/2023 BOTTLES DRAWN AEROBIC AND ANAEROBIC Blood Culture adequate volume   Final   Culture 07/24/2023    Final  Value:NO GROWTH 5 DAYS Performed at [facility], [address], [place], [place] [address]    Report Status 07/24/2023 07/29/2023 FINAL   Final  Lab on 07/21/2023  Component Date Value Ref Range Status   WBC 07/21/2023 4.6  4.0 - 10.5 K/uL Final   RBC 07/21/2023 3.25 (L)  4.22 - 5.81 Mil/uL Final   Hemoglobin 07/21/2023 9.7 (L)  13.0 - 17.0 g/dL Final   HCT 93/79/7974 28.8 (L)  39.0 - 52.0 % Final   MCV 07/21/2023 88.8  78.0 - 100.0 fl Final   MCHC 07/21/2023 33.7  30.0 - 36.0 g/dL Final   RDW 93/79/7974 14.6  11.5 - 15.5 % Final   Platelets 07/21/2023 129.0 (L)  150.0 - 400.0 K/uL Final   Neutrophils Relative % 07/21/2023 58.9  43.0 - 77.0 % Final   Lymphocytes Relative 07/21/2023 23.3  12.0 - 46.0 % Final   Monocytes Relative 07/21/2023 8.4  3.0 - 12.0 % Final   Eosinophils Relative 07/21/2023 9.0 (H)  0.0 - 5.0 % Final   Basophils Relative 07/21/2023 0.4  0.0 - 3.0 % Final   Neutro Abs 07/21/2023 2.7  1.4 - 7.7 K/uL Final   Lymphs Abs 07/21/2023 1.1  0.7 - 4.0 K/uL Final   Monocytes Absolute 07/21/2023 0.4  0.1 - 1.0 K/uL Final   Eosinophils Absolute 07/21/2023 0.4  0.0 - 0.7 K/uL Final   Basophils Absolute 07/21/2023 0.0  0.0 - 0.1 K/uL Final  Office Visit on 07/20/2023  Component Date Value Ref Range Status   Glucose 07/20/2023 119 (H)  70 - 99 mg/dL Final   BUN  93/80/7974 30 (H)  8 - 27 mg/dL Final   Creatinine, Ser 07/20/2023 1.85 (H)  0.76 - 1.27 mg/dL Final   eGFR 93/80/7974 37 (L)  >59 mL/min/1.73 Final   BUN/Creatinine Ratio 07/20/2023 16  10 - 24 Final   Sodium 07/20/2023 135  134 - 144 mmol/L Final   Potassium 07/20/2023 5.2  3.5 - 5.2 mmol/L Final   Chloride 07/20/2023 100  96 - 106 mmol/L Final   CO2 07/20/2023 21  20 - 29 mmol/L Final   Calcium  07/20/2023 8.4 (L)  8.6 - 10.2 mg/dL Final  Office Visit on 07/14/2023  Component Date Value Ref Range Status   Amphetamines, Urine 07/21/2023 Negative  Cutoff=1000 ng/mL Final   Cannabinoid Quant, Ur 07/21/2023 Negative  Cutoff=50 ng/mL Final   Cocaine (Metab.) 07/21/2023 Negative  Cutoff=300 ng/mL Final   OPIATE QUANTITATIVE URINE 07/21/2023 Negative  Cutoff=2000 ng/mL Final   PCP Quant, Ur 07/21/2023 Negative  Cutoff=25 ng/mL Final  Appointment on 06/29/2023  Component Date Value Ref Range Status   TSH 06/29/2023 2.810  0.350 - 4.500 uIU/mL Final   Total Protein ELP 06/29/2023 6.8  6.0 - 8.5 g/dL Final   Albumin ELP 94/70/7974 3.3  2.9 - 4.4 g/dL Final   Joeyj-8-Honalopw 06/29/2023 0.3  0.0 - 0.4 g/dL Final   Joeyj-7-Honalopw 06/29/2023 0.8  0.4 - 1.0 g/dL Final   Beta Globulin 94/70/7974 1.0  0.7 - 1.3 g/dL Final   Gamma Globulin 06/29/2023 1.4  0.4 - 1.8 g/dL Final   M-Spike, % 94/70/7974 Not Observed  Not Observed g/dL Final   Globulin, Total 06/29/2023 3.5  2.2 - 3.9 g/dL Corrected   A/G Ratio 94/70/7974 0.9  0.7 - 1.7 Corrected   Comment 06/29/2023 Comment   Corrected   SPEP Interpretation 06/29/2023 Comment   Final   Vitamin B-12 06/29/2023 245  180 - 914  pg/mL Final   Folate 06/29/2023 13.6  >5.9 ng/mL Final   LDH 06/29/2023 209 (H)  98 - 192 U/L Final   Iron 06/29/2023 71  45 - 182 ug/dL Final   TIBC 94/70/7974 259  250 - 450 ug/dL Final   Saturation Ratios 06/29/2023 27  17.9 - 39.5 % Final   UIBC 06/29/2023 188  117 - 376 ug/dL Final   Ferritin 94/70/7974 268  24 - 336  ng/mL Final   Sodium 06/29/2023 135  135 - 145 mmol/L Final   Potassium 06/29/2023 4.2  3.5 - 5.1 mmol/L Final   Chloride 06/29/2023 102  98 - 111 mmol/L Final   CO2 06/29/2023 26  22 - 32 mmol/L Final   Glucose, Bld 06/29/2023 155 (H)  70 - 99 mg/dL Final   BUN 94/70/7974 27 (H)  8 - 23 mg/dL Final   Creatinine 94/70/7974 1.77 (H)  0.61 - 1.24 mg/dL Final   Calcium  06/29/2023 8.9  8.9 - 10.3 mg/dL Final   Total Protein 94/70/7974 7.4  6.5 - 8.1 g/dL Final   Albumin 94/70/7974 4.0  3.5 - 5.0 g/dL Final   AST 94/70/7974 26  15 - 41 U/L Final   ALT 06/29/2023 10  0 - 44 U/L Final   Alkaline Phosphatase 06/29/2023 110  38 - 126 U/L Final   Total Bilirubin 06/29/2023 0.4  0.0 - 1.2 mg/dL Final   GFR, Estimated 06/29/2023 39 (L)  >60 mL/min Final   Anion gap 06/29/2023 7  5 - 15 Final   WBC Count 06/29/2023 6.6  4.0 - 10.5 K/uL Final   RBC 06/29/2023 3.50 (L)  4.22 - 5.81 MIL/uL Final   Hemoglobin 06/29/2023 10.2 (L)  13.0 - 17.0 g/dL Final   HCT 94/70/7974 31.0 (L)  39.0 - 52.0 % Final   MCV 06/29/2023 88.6  80.0 - 100.0 fL Final   MCH 06/29/2023 29.1  26.0 - 34.0 pg Final   MCHC 06/29/2023 32.9  30.0 - 36.0 g/dL Final   RDW 94/70/7974 14.2  11.5 - 15.5 % Final   Platelet Count 06/29/2023 165  150 - 400 K/uL Final   nRBC 06/29/2023 0.0  0.0 - 0.2 % Final   Neutrophils Relative % 06/29/2023 59  % Final   Neutro Abs 06/29/2023 4.0  1.7 - 7.7 K/uL Final   Lymphocytes Relative 06/29/2023 23  % Final   Lymphs Abs 06/29/2023 1.5  0.7 - 4.0 K/uL Final   Monocytes Relative 06/29/2023 7  % Final   Monocytes Absolute 06/29/2023 0.4  0.1 - 1.0 K/uL Final   Eosinophils Relative 06/29/2023 10  % Final   Eosinophils Absolute 06/29/2023 0.7 (H)  0.0 - 0.5 K/uL Final   Basophils Relative 06/29/2023 1  % Final   Basophils Absolute 06/29/2023 0.0  0.0 - 0.1 K/uL Final   Immature Granulocytes 06/29/2023 0  % Final   Abs Immature Granulocytes 06/29/2023 0.01  0.00 - 0.07 K/uL Final  Lab on 06/21/2023   Component Date Value Ref Range Status   WBC 06/21/2023 6.0  4.0 - 10.5 K/uL Final   RBC 06/21/2023 3.44 (L)  4.22 - 5.81 Mil/uL Final   Hemoglobin 06/21/2023 10.2 (L)  13.0 - 17.0 g/dL Final   HCT 94/78/7974 30.4 (L)  39.0 - 52.0 % Final   MCV 06/21/2023 88.4  78.0 - 100.0 fl Final   MCHC 06/21/2023 33.6  30.0 - 36.0 g/dL Final   RDW 94/78/7974 15.2  11.5 - 15.5 % Final  Platelets 06/21/2023 178.0  150.0 - 400.0 K/uL Final   Neutrophils Relative % 06/21/2023 46.5  43.0 - 77.0 % Final   Lymphocytes Relative 06/21/2023 29.5  12.0 - 46.0 % Final   Monocytes Relative 06/21/2023 8.8  3.0 - 12.0 % Final   Eosinophils Relative 06/21/2023 14.6 (H)  0.0 - 5.0 % Final   Basophils Relative 06/21/2023 0.6  0.0 - 3.0 % Final   Neutro Abs 06/21/2023 2.8  1.4 - 7.7 K/uL Final   Lymphs Abs 06/21/2023 1.8  0.7 - 4.0 K/uL Final   Monocytes Absolute 06/21/2023 0.5  0.1 - 1.0 K/uL Final   Eosinophils Absolute 06/21/2023 0.9 (H)  0.0 - 0.7 K/uL Final   Basophils Absolute 06/21/2023 0.0  0.0 - 0.1 K/uL Final   Glucose, Bld 06/21/2023 98  65 - 99 mg/dL Final   BUN 94/78/7974 32 (H)  7 - 25 mg/dL Final   Creat 94/78/7974 1.87 (H)  0.70 - 1.28 mg/dL Final   BUN/Creatinine Ratio 06/21/2023 17  6 - 22 (calc) Final   Sodium 06/21/2023 133 (L)  135 - 146 mmol/L Final   Potassium 06/21/2023 4.0  3.5 - 5.3 mmol/L Final   Chloride 06/21/2023 101  98 - 110 mmol/L Final   CO2 06/21/2023 22  20 - 32 mmol/L Final   Calcium  06/21/2023 8.8  8.6 - 10.3 mg/dL Final   Total Protein 94/78/7974 7.0  6.1 - 8.1 g/dL Final   Albumin 94/78/7974 3.6  3.6 - 5.1 g/dL Final   Globulin 94/78/7974 3.4  1.9 - 3.7 g/dL (calc) Final   AG Ratio 06/21/2023 1.1  1.0 - 2.5 (calc) Final   Total Bilirubin 06/21/2023 0.3  0.2 - 1.2 mg/dL Final   Alkaline phosphatase (APISO) 06/21/2023 82  35 - 144 U/L Final   AST 06/21/2023 20  10 - 35 U/L Final   ALT 06/21/2023 6 (L)  9 - 46 U/L Final  There may be more visits with results that are not  included.  No image results found. ECHO TEE Result Date: 07/24/2023    TRANSESOPHOGEAL ECHO REPORT   Patient Name:   Michael Bender Date of Exam: 07/24/2023 Medical Rec #:  [id number]        Height:       71.0 in Accession #:    [id number]       Weight:       150.0 lb Date of Birth:  1945/05/17       BSA:          1.866 m Patient Age:    27 years         BP:           142/44 mmHg Patient Gender: M                HR:           50 bpm. Exam Location:  Outpatient Procedure: Transesophageal Echo, Cardiac Doppler, Color Doppler and 3D Echo            (Both Spectral and Color Flow Doppler were utilized during            procedure). Indications:     aortic insufficiency  History:         Patient has prior history of Echocardiogram examinations, most                  recent 04/21/2023. Risk Factors:Hypertension.  Sonographer:     Philomena Daring Sonographer#2:  Tinnie Barefoot RDCS Referring Phys:  [id number] MADISON L FOUNTAIN Diagnosing Phys: Redell Shallow MD PROCEDURE: After discussion of the risks and benefits of a TEE, an informed consent was obtained from the patient. The transesophogeal probe was passed without difficulty through the esophogus of the patient. Imaged were obtained with the patient in a left lateral decubitus position. Sedation performed by different physician. The patient was monitored while under deep sedation. Anesthestetic sedation was provided intravenously by Anesthesiology: 300mg  of Propofol . Image quality was excellent. The patient developed no complications during the procedure.  IMPRESSIONS  1. Oscillating, linear density (0.7-0.8 cm) on left coronary cusp of aortic valve concerning for vegetation; severe AI (best seen on transgastric views).  2. Left ventricular ejection fraction, by estimation, is 50 to 55%. The left ventricle has low normal function. The left ventricle has no regional wall motion abnormalities. The left ventricular internal cavity size was mildly dilated.  3. Right  ventricular systolic function is mildly reduced. The right ventricular size is normal.  4. Left atrial size was moderately dilated. No left atrial/left atrial appendage thrombus was detected.  5. The mitral valve is normal in structure. Mild mitral valve regurgitation.  6. The aortic valve is tricuspid. Aortic valve regurgitation is severe.  7. There is Moderate (Grade III) plaque involving the descending aorta.  8. 3D performed of the aortic valve. FINDINGS  Left Ventricle: Left ventricular ejection fraction, by estimation, is 50 to 55%. The left ventricle has low normal function. The left ventricle has no regional wall motion abnormalities. The left ventricular internal cavity size was mildly dilated. Right Ventricle: The right ventricular size is normal. Right ventricular systolic function is mildly reduced. Left Atrium: Left atrial size was moderately dilated. No left atrial/left atrial appendage thrombus was detected. Right Atrium: Right atrial size was normal in size. Pericardium: There is no evidence of pericardial effusion. Mitral Valve: The mitral valve is normal in structure. Mild mitral valve regurgitation. Tricuspid Valve: The tricuspid valve is normal in structure. Tricuspid valve regurgitation is trivial. Aortic Valve: The aortic valve is tricuspid. Aortic valve regurgitation is severe. Pulmonic Valve: The pulmonic valve was normal in structure. Pulmonic valve regurgitation is not visualized. Aorta: The aortic root is normal in size and structure. There is moderate (Grade III) plaque involving the descending aorta. IAS/Shunts: No atrial level shunt detected by color flow Doppler. Additional Comments: Oscillating, linear density (0.7-0.8 cm) on left coronary cusp of aortic valve concerning for vegetation; severe AI (best seen on transgastric views).  AORTA Ao Root diam: 3.50 cm Ao Asc diam:  3.80 cm Redell Shallow MD Electronically signed by Redell Shallow MD Signature Date/Time: 07/24/2023/12:17:18 PM     Final    EP STUDY Result Date: 07/24/2023 See surgical note for result.        ASSESSMENT & PLAN   Assessment & Plan Chronic bilateral low back pain with bilateral sciatica Weakness of both lower extremities Myelopathy (HCC) DDD (degenerative disc disease), cervical Chronic back pain with prior spinal fusions and hardware   Chronic back pain persists following spinal fusions in the cervical and lumbar regions, with severe pain necessitating oxycodone . Differential diagnosis includes hardware malfunction or infection. Order MRI of the lumbar and cervical spine with and without contrast, and x-rays of the cervical, mid, and lumbar spine to evaluate hardware. Refer to the pain management clinic for further evaluation and management.  Complicated by history bacteremia and endocarditis and fusion hardware that could be infected, and recent incontinence that semes to have  resolved and worsening weakness that seems to have paused  Suspected spinal cord disease with monitoring for cauda equina syndrome   Suspected spinal cord disease with previous leg weakness and bowel issues. Symptoms have not progressed since the ER visit on September 14, 2023. No current bowel or bladder incontinence. Monitor for symptom progression, including bowel or bladder incontinence, and advise returning to the ER if symptoms progress. 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 Monitor for signs of infection, particularly fever, to prevent complications. Advise monitoring for fever as a sign of potential infection recurrence. Uncomplicated opioid dependence (HCC) Chronic opioid therapy is used for severe back pain, history of multiple spine fusions, with concerns about opioid tapering and management due to the regulatory environment. Discussed potential  referral to [facility] or [facility] for pain management, with consideration of [facility] as an alternative due to previous issues with staff at other clinics. Refer to a pain management clinic for opioid management and potential tapering, considering [facility] as an alternative. S/P spinal fusion    ORDER ASSOCIATIONS  #   DIAGNOSIS / CONDITION ICD-10 ENCOUNTER ORDER     ICD-10-CM   1. Chronic bilateral low back pain with bilateral sciatica  M54.42 MR CERVICAL SPINE W WO CONTRAST   M54.41 MR LUMBAR SPINE W WO CONTRAST   G89.29 DG Cervical Spine Complete    Ambulatory referral to Pain Clinic    DG Thoracic Spine W/Swimmers    2. Weakness of both lower extremities  R29.898 MR CERVICAL SPINE W WO CONTRAST    MR LUMBAR SPINE W WO CONTRAST    DG Cervical Spine Complete    Ambulatory referral to Pain Clinic    DG Thoracic Spine W/Swimmers    3. DDD (degenerative disc disease), cervical  M50.30 MR CERVICAL SPINE W WO CONTRAST    MR LUMBAR SPINE W WO CONTRAST    DG Cervical Spine Complete    Ambulatory referral to Pain Clinic    DG Thoracic Spine W/Swimmers    4. Myelopathy (HCC)  G95.9 MR CERVICAL SPINE W WO CONTRAST    MR LUMBAR SPINE W WO CONTRAST    DG Cervical Spine Complete    Ambulatory referral to Pain Clinic    DG Thoracic Spine W/Swimmers           Orders Placed in Encounter:   MR CERVICAL SPINE W WO CONTRAST       Comments: Cervical MRI: Chronic severe pain, prior spinal surgery, progressive weakness, gait dysfunction, history of falls, and need to rule out structural pathology. M54.2 - Cervicalgia M96.1 - Postlaminectomy syndrome M47.812 - Spondylosis with radiculopathy, cervical region M62.81 - Muscle weakness (generalized) R26.89 - Other abnormalities of gait and mobility Z98.1 - History of spinal fusion    MR LUMBAR SPINE W WO CONTRAST       Comments: Chronic severe pain, prior spinal surgery, progressive weakness, gait  dysfunction, history of falls, and need to rule out structural pathology.  Also endocarditis history rule out abscess  M54.5 - Low back pain M96.1 - Postlaminectomy syndrome M47.817 - Spondylosis with radiculopathy, lumbar region M62.81 - Muscle weakness (generalized) R26.89 - Other abnormalities of gait and mobility Z98.1 - History of spinal fusion    DG Cervical Spine Complete       Comments: Chronic severe pain, prior spinal surgery,  progressive weakness, gait dysfunction, history of falls, and need to rule out structural pathology.  M54.5 - Low back pain M96.1 - Postlaminectomy syndrome M47.817 - Spondylosis with radiculopathy, lumbar region M62.81 - Muscle weakness (generalized) R26.89 - Other abnormalities of gait and mobility Z98.1 - History of spinal fusion    Ambulatory referral to Pain Clinic         DG Thoracic Spine W/Swimmers               This document was synthesized by artificial intelligence (Abridge) using HIPAA-compliant recording of the clinical interaction;   We discussed the use of AI scribe software for clinical note transcription with the patient, who gave verbal consent to proceed. additional Info: This encounter employed state-of-the-art, real-time, collaborative documentation. The patient actively reviewed and assisted in updating their electronic medical record on a shared screen, ensuring transparency and facilitating joint problem-solving for the problem list, overview, and plan. This approach promotes accurate, informed care. The treatment plan was discussed and reviewed in detail, including medication safety, potential side effects, and all patient questions. We confirmed understanding and comfort with the plan. Follow-up instructions were established, including contacting the office for any concerns, returning if symptoms worsen, persist, or new symptoms develop, and precautions for potential emergency department visits.

## 2023-09-25 NOTE — Assessment & Plan Note (Signed)
 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 Monitor for signs of infection, particularly fever, to prevent complications. Advise monitoring for fever as a sign of potential infection recurrence.

## 2023-09-25 NOTE — Assessment & Plan Note (Signed)
 Chronic back pain with prior spinal fusions and hardware   Chronic back pain persists following spinal fusions in the cervical and lumbar regions, with severe pain necessitating oxycodone . Differential diagnosis includes hardware malfunction or infection. Order MRI of the lumbar and cervical spine with and without contrast, and x-rays of the cervical, mid, and lumbar spine to evaluate hardware. Refer to the pain management clinic for further evaluation and management.  Complicated by history bacteremia and endocarditis and fusion hardware that could be infected, and recent incontinence that semes to have resolved and worsening weakness that seems to have paused  Suspected spinal cord disease with monitoring for cauda equina syndrome   Suspected spinal cord disease with previous leg weakness and bowel issues. Symptoms have not progressed since the ER visit on September 14, 2023. No current bowel or bladder incontinence. Monitor for symptom progression, including bowel or bladder incontinence, and advise returning to the ER if symptoms progress.

## 2023-09-25 NOTE — Patient Instructions (Signed)
 It was a pleasure seeing you today! Your health and satisfaction are our top priorities.  Michael Cone, MD  VISIT SUMMARY: During your visit, we discussed your ongoing issues with leg weakness, bowel control, and chronic back pain. We also reviewed your history of spinal cord surgery and aortic valve insufficiency. We have outlined a plan to address these concerns and ensure proper monitoring and management.  YOUR PLAN: -CHRONIC BACK PAIN WITH PRIOR SPINAL FUSIONS AND HARDWARE: Chronic back pain persists following your spinal fusions in the cervical and lumbar regions. We need to check if the hardware from your previous surgeries is causing issues or if there is an infection. We will do this by ordering MRI scans of your lumbar and cervical spine with and without contrast, and x-rays of your cervical, mid, and lumbar spine. You will also be referred to a pain management clinic for further evaluation and management.  -SUSPECTED SPINAL CORD DISEASE WITH MONITORING FOR CAUDA EQUINA SYNDROME: You have symptoms that suggest a spinal cord issue, including leg weakness and previous bowel control problems. Although your symptoms have not worsened since your ER visit, it is important to monitor for any progression, especially new bowel or bladder incontinence. If these symptoms occur, please return to the ER immediately.  -AORTIC VALVE INSUFFICIENCY DUE TO PRIOR INFECTION, MONITORING FOR RECURRENCE: Aortic valve insufficiency means your aortic valve does not close properly, which can be due to a previous infection. Currently, there are no signs of recurrence, but it is important to monitor for fever as it could indicate a new infection. Please keep an eye on your temperature and report any fevers.  -OPIOID DEPENDENCE ON CHRONIC OPIOID THERAPY: You are on chronic opioid therapy for severe back pain, which can lead to dependence. We discussed the possibility of tapering your opioid use and managing your pain  through a pain management clinic. We will refer you to a pain management clinic, considering [facility] as an alternative due to previous issues with other clinics.  INSTRUCTIONS: Please schedule the MRI and x-ray scans as soon as possible. Follow up with the pain management clinic for further evaluation and management of your chronic back pain and opioid therapy. Monitor for any new symptoms, especially bowel or bladder incontinence, and return to the ER if these occur. Keep an eye on your temperature and report any fevers to monitor for potential infection recurrence.  Your Providers PCP: [name], MD,  [phone]) Referring Provider: [name], MD,  [phone]) Care Team Provider: Evonnie Asberry RAMAN, DO,  [phone]) Care Team Provider: Fleeta Rothman, Jomarie SAILOR, MD,  [phone]) Care Team Provider: Avram Lupita BRAVO, MD,  [phone]) Care Team Provider: Okey Vina GAILS, MD,  [phone]) Care Team Provider: [name], NP,  [phone])  NEXT STEPS: [x]  Early Intervention: Schedule sooner appointment, call our on-call services, or go to emergency room if there is any significant Increase in pain or discomfort New or worsening symptoms Sudden or severe changes in your health [x]  Flexible Follow-Up: We recommend a No follow-ups on file. for optimal routine care. This allows for progress monitoring and treatment adjustments. [x]  Preventive Care: Schedule your annual preventive care visit! It's typically covered by insurance and helps identify potential health issues early. [x]  Lab & X-ray Appointments: Incomplete tests scheduled today, or call to schedule. X-rays: [facility] (M-F, 8:30am-noon or 1pm-5pm). [x]  Medical Information Release: Sign a release form at front desk to obtain relevant medical information we don't have.  MAKING THE MOST OF OUR FOCUSED 20 MINUTE APPOINTMENTS: [x]   Clearly state your top concerns at the  beginning of the visit to focus our discussion [x]   If you anticipate you will need more time, please inform the front desk during scheduling - we can book multiple appointments in the same week. [x]   If you have transportation problems- use our convenient video appointments or ask about transportation support. [x]   We can get down to business faster if you use MyChart to update information before the visit and submit non-urgent questions before your visit. Thank you for taking the time to provide details through MyChart.  Let our nurse know and she can import this information into your encounter documents.  Arrival and Wait Times: [x]   Arriving on time ensures that everyone receives prompt attention. [x]   Early morning (8a) and afternoon (1p) appointments tend to have shortest wait times. [x]   Unfortunately, we cannot delay appointments for late arrivals or hold slots during phone calls.  Getting Answers and Following Up [x]   Simple Questions & Concerns: For quick questions or basic follow-up after your visit, reach us  at [phone] or MyChart messaging. [x]   Complex Concerns: If your concern is more complex, scheduling an appointment might be best. Discuss this with the staff to find the most suitable option. [x]   Lab & Imaging Results: We'll contact you directly if results are abnormal or you don't use MyChart. Most normal results will be on MyChart within 2-3 business days, with a review message from Dr. Jesus. Haven't heard back in 2 weeks? Need results sooner? Contact us  at [phone]. [x]   Referrals: Our referral coordinator will manage specialist referrals. The specialist's office should contact you within 2 weeks to schedule an appointment. Call us  if you haven't heard from them after 2 weeks.  Staying Connected [x]   MyChart: Activate your MyChart for the fastest way to access results and message us . See the last page of this paperwork for instructions on how to  activate.  Bring to Your Next Appointment [x]   Medications: Please bring all your medication bottles to your next appointment to ensure we have an accurate record of your prescriptions. [x]   Health Diaries: If you're monitoring any health conditions at home, keeping a diary of your readings can be very helpful for discussions at your next appointment.  Billing [x]   X-ray & Lab Orders: These are billed by separate companies. Contact the invoicing company directly for questions or concerns. [x]   Visit Charges: Discuss any billing inquiries with our administrative services team.  Your Satisfaction Matters [x]   Share Your Experience: We strive for your satisfaction! If you have any complaints, or preferably compliments, please let Dr. Jesus know directly or contact our Practice Administrators, Manuelita Rubin or [employer], by asking at the front desk.   Reviewing Your Records [x]   Review this early draft of your clinical encounter notes below and the final encounter summary tomorrow on MyChart after its been completed.  All orders placed so far are visible here: Chronic bilateral low back pain with bilateral sciatica -     MR CERVICAL SPINE W WO CONTRAST; Future -     MR LUMBAR SPINE W WO CONTRAST; Future -     DG Cervical Spine Complete; Future -     Ambulatory referral to Pain Clinic -     DG Thoracic Spine W/Swimmers; Future -     DRUG MONITOR, PANEL 5, SCREEN, URINE  Weakness of both lower extremities -  MR CERVICAL SPINE W WO CONTRAST; Future -     MR LUMBAR SPINE W WO CONTRAST; Future -     DG Cervical Spine Complete; Future -     Ambulatory referral to Pain Clinic -     DG Thoracic Spine W/Swimmers; Future -     DRUG MONITOR, PANEL 5, SCREEN, URINE  DDD (degenerative disc disease), cervical -     MR CERVICAL SPINE W WO CONTRAST; Future -     MR LUMBAR SPINE W WO CONTRAST; Future -     DG Cervical Spine Complete; Future -     Ambulatory referral to Pain Clinic -     DG  Thoracic Spine W/Swimmers; Future -     DRUG MONITOR, PANEL 5, SCREEN, URINE  Myelopathy (HCC) -     MR CERVICAL SPINE W WO CONTRAST; Future -     MR LUMBAR SPINE W WO CONTRAST; Future -     DG Cervical Spine Complete; Future -     Ambulatory referral to Pain Clinic -     DG Thoracic Spine W/Swimmers; Future -     DRUG MONITOR, PANEL 5, SCREEN, URINE  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

## 2023-09-25 NOTE — Assessment & Plan Note (Signed)
 Chronic opioid therapy is used for severe back pain, history of multiple spine fusions, with concerns about opioid tapering and management due to the regulatory environment. Discussed potential referral to [facility] or [facility] for pain management, with consideration of [facility] as an alternative due to previous issues with staff at other clinics. Refer to a pain management clinic for opioid management and potential tapering, considering [facility] as an alternative.

## 2023-09-26 ENCOUNTER — Encounter: Payer: Self-pay | Admitting: Internal Medicine

## 2023-09-27 ENCOUNTER — Ambulatory Visit: Payer: Self-pay | Admitting: Internal Medicine

## 2023-09-27 DIAGNOSIS — R826 Abnormal urine levels of substances chiefly nonmedicinal as to source: Secondary | ICD-10-CM

## 2023-09-27 LAB — DRUG MONITOR, PANEL 5, SCREEN, URINE
Amphetamines: NEGATIVE ng/mL (ref <500)
Barbiturates: NEGATIVE ng/mL (ref <300)
Benzodiazepines: NEGATIVE ng/mL (ref <100)
Cocaine Metabolite: NEGATIVE ng/mL (ref <150)
Creatinine: 84.9 mg/dL (ref >=20.0)
Marijuana Metabolite: POSITIVE ng/mL — AB (ref <20)
Methadone Metabolite: POSITIVE ng/mL — AB (ref <100)
Opiates: POSITIVE ng/mL — AB (ref <100)
Oxidant: NEGATIVE ug/mL (ref <200)
Oxycodone: POSITIVE ng/mL — AB (ref <100)
pH: 6.7 (ref 4.5–9.0)

## 2023-09-27 LAB — DM TEMPLATE

## 2023-09-27 NOTE — Progress Notes (Signed)
 Sent message to the lab they are checking to see if pt needs to come back in for another urine sample

## 2023-09-27 NOTE — Progress Notes (Signed)
 Suspicious for false readings on methadone and marijuana, we need to send off for confirmatory testing.    Please notify lab of the new confirmatory tests I've sent and if they can't get them we will need to repeat urine drug screen for them.   Documentation and Plan for Management: A 78 year old patient with a history of multiple spinal fusions and chronic pain has been maintained on oxycodone  for many years, currently prescribed as a bridge due to pain management conflicts. The urine drug screen is positive for marijuana metabolite, methadone metabolite, opiates, and oxycodone . Urine drug testing in chronic opioid therapy is recommended by the [place]  Centers for Disease Control and Prevention (CDC) and the American Academy of Pain Medicine to assess for adherence to prescribed opioids and to identify use of other substances that may increase risk for overdose or adverse events. The primary goal is patient safety, not punishment or exclusion from care.[1-3] It is essential to educate patients that urine drug testing is intended to improve safety, clarify expected results, and foster open, nonjudgmental communication about substance use.[1-2] Immunoassay urine drug screens are commonly used for initial testing due to their speed and cost-effectiveness, but they have significant limitations. These include false positives and negatives, cross-reactivity, and inability to reliably detect all opioids--particularly semisynthetic and synthetic agents such as methadone and oxycodone , which often require specific assays.[1][4-5] Confirmatory testing with gas chromatography-mass spectrometry (GCMS) or liquid chromatography-mass spectrometry (LCMS) should be considered when results are unexpected or will impact clinical management, as these methods provide greater specificity and sensitivity.[1][4-6] Interpretation of positive results requires careful consideration:  Oxycodone : Expected in the urine of a  patient prescribed oxycodone , provided the assay is specific for this agent.[1][5]  Opiates: May reflect cross-reactivity with oxycodone  or other opioids, or could indicate use of additional opiates. Immunoassays for opiates typically detect morphine and codeine, but not oxycodone  or methadone unless specifically included.[4-5]  Methadone metabolite: If methadone is not prescribed, this may represent undisclosed use, cross-reactivity, or laboratory error. Confirmatory testing is warranted if this result is unexpected.[1][5]  Marijuana metabolite: THC can be detected for extended periods in chronic users. Its presence should be discussed with the patient, as it may have implications for opioid safety and risk.[1][3][5] When unexpected or concerning results are identified, the CDC and American Academy of Pain Medicine recommend discussing findings with the patient and, if necessary, consulting the laboratory or a toxicologist prior to confirmatory testing.[1-2][5] Dismissal from care based solely on urine drug screen results is discouraged, as this may compromise patient safety and miss opportunities for intervention.[1-3] Instead, findings should prompt further assessment, risk mitigation strategies (such as naloxone  co-prescription or increased monitoring), and referral for substance use disorder evaluation if indicated.[1-3] Ongoing monitoring should be individualized based on patient risk factors, with periodic or random urine drug testing recommended for patients on chronic opioid therapy, especially those with complex pain histories or polypharmacy.[1][3][6-8] All clinical reasoning, patient discussions, and management decisions should be thoroughly documented to support continuity of care and medicolegal protection.[3][5] In summary, the urine drug screen should be managed by interpreting results in the context of prescribed medications, discussing unexpected findings with the patient, considering  confirmatory testing when indicated, and using results to guide ongoing risk assessment and management--not as grounds for automatic dismissal from care. This approach aligns with current consensus guidelines and best practices for chronic opioid therapy. 1. CDC Clinical Practice Guideline for Prescribing Opioids for Pain - [place] , 2022. Dowell D, Ragan KR, Joshua ALVERNA Gwyneth CLEMETINE,  Gigi HAMS MMWR. Recommendations and Reports : Morbidity and Mortality Weekly Report. Recommendations and Reports. 2022;71(3):1-95. doi:10.15585/mmwr.mm2896j8. Practice Guideline  Leading Journal    2. CDC Guideline for Prescribing Opioids for Chronic Pain--[place] , 2016. Dowell D, Haegerich TM, Gigi SAUNDERS. JAMA. 2016;315(15):1624-45. doi:10.1001/jama.7983.8535. Practice Guideline  Leading Journal  3. Clinical Guidelines for the Use of Chronic Opioid Therapy in Chronic Noncancer Pain. Gigi SAUNDERS, Fanciullo GJ, Fine PG, et al. The Journal of Pain. 2009;10(2):113-30. doi:10.1016/j.jpain.2008.10.008. Practice Guideline  4. Use of Opioids in the Management of Chronic Pain (2022). Nat Daring DPT OCS SCS FPS, [name] Rodney MS CNP, Leeroy Claude MD MPE, et al Department of [facility] Guideline     5. Urine Drug Tests: Ordering and Interpreting Results. Kale N. American Family Physician. 2019;99(1):33-39. 6. Rational Urine Drug Monitoring in Patients Receiving Opioids for Chronic Pain: Consensus Recommendations. Argoff CE, Alford DP, Fudin J, et al. Pain Medicine [facility] [place] 2018;19(1):97-117. doi:10.1093/pm/pnx285.           7. Methadone Safety: A Clinical Practice Guideline From the American Pain Society and College on Problems of Drug Dependence, in Collaboration With the Heart Rhythm Society. Gigi SAUNDERS, Cruciani RA, Fiellin DA, et al. The Journal of Pain. 2014;15(4):321-37. doi:10.1016/j.jpain.2014.01.494. Practice Guideline     8. ACOEM Practice Guidelines:  Opioids for Treatment of Acute, Subacute, Chronic, and Postoperative Pain. Hegmann KT, Boyd MS, Bowden K, et al. Journal of Occupational and Environmental Medicine. 2014;56(12):e143-59. doi:10.1097/JOM.0000000000000352.

## 2023-09-28 NOTE — Progress Notes (Signed)
 Pt is coming back in today to recollect urine

## 2023-10-03 DIAGNOSIS — G8929 Other chronic pain: Secondary | ICD-10-CM | POA: Diagnosis not present

## 2023-10-03 DIAGNOSIS — M5442 Lumbago with sciatica, left side: Secondary | ICD-10-CM | POA: Diagnosis not present

## 2023-10-03 DIAGNOSIS — M5441 Lumbago with sciatica, right side: Secondary | ICD-10-CM | POA: Diagnosis not present

## 2023-10-06 ENCOUNTER — Other Ambulatory Visit

## 2023-10-09 ENCOUNTER — Encounter: Payer: Self-pay | Admitting: *Deleted

## 2023-10-10 ENCOUNTER — Encounter: Payer: Self-pay | Admitting: Emergency Medicine

## 2023-10-10 ENCOUNTER — Ambulatory Visit: Attending: Emergency Medicine | Admitting: Emergency Medicine

## 2023-10-10 VITALS — BP 142/58 | HR 67 | Ht 71.0 in | Wt 153.0 lb

## 2023-10-10 DIAGNOSIS — I330 Acute and subacute infective endocarditis: Secondary | ICD-10-CM | POA: Diagnosis not present

## 2023-10-10 DIAGNOSIS — I5033 Acute on chronic diastolic (congestive) heart failure: Secondary | ICD-10-CM | POA: Diagnosis not present

## 2023-10-10 DIAGNOSIS — E782 Mixed hyperlipidemia: Secondary | ICD-10-CM | POA: Diagnosis not present

## 2023-10-10 DIAGNOSIS — I10 Essential (primary) hypertension: Secondary | ICD-10-CM | POA: Diagnosis not present

## 2023-10-10 DIAGNOSIS — N1832 Chronic kidney disease, stage 3b: Secondary | ICD-10-CM | POA: Diagnosis not present

## 2023-10-10 DIAGNOSIS — I5022 Chronic systolic (congestive) heart failure: Secondary | ICD-10-CM

## 2023-10-10 DIAGNOSIS — I480 Paroxysmal atrial fibrillation: Secondary | ICD-10-CM | POA: Diagnosis not present

## 2023-10-10 DIAGNOSIS — E785 Hyperlipidemia, unspecified: Secondary | ICD-10-CM | POA: Diagnosis not present

## 2023-10-10 DIAGNOSIS — D509 Iron deficiency anemia, unspecified: Secondary | ICD-10-CM | POA: Diagnosis not present

## 2023-10-10 DIAGNOSIS — I351 Nonrheumatic aortic (valve) insufficiency: Secondary | ICD-10-CM | POA: Diagnosis not present

## 2023-10-10 LAB — CBC

## 2023-10-10 NOTE — Progress Notes (Signed)
 Cardiology Office Note:    Date:  10/10/2023  ID:  Michael Bender, DOB Apr 03, 1945, MRN [id number] PCP: [name], MD  [facility] Providers Cardiologist:  Vina Gull, MD Cardiology APP:  [name], NP       Patient Profile:       Chief Complaint: 30-month follow-up History of Present Illness:  Michael Bender is a 78 y.o. male with visit-pertinent history of  aortic regurgitation, hypertension, hyperlipidemia, diastolic dysfunction, orthostatic hypotension with recurrent syncope, paroxysmal atrial fibrillation, rhabdomyolysis, BPH, CVA, PAD, emphysema, GERD, DJD, HFmrEF   He was admitted 10/27/2022 through 11/02/2022 for AKI, bladder outlet obstruction, and traumatic rhabdomyolysis.  CK on admission was 2828 and was started on IV fluids with improvement.  His creatinine was 4.2 on admission and down to 1.61 at discharge.   He was admitted 11/05/2022 through 11/14/2022 with recurrent syncopal episode associated with generalized weakness and encephalopathy and found to have MSSA bacteremia on blood cultures on 11/05/2022 and septic arthritis.  He was started empirically on antibiotics.  ID was consulted and MRI of the left wrist was done concerning for septic arthritis.  Orthopedic surgery was consulted and he underwent left wrist capsulotomy and washout on 11/09/2022.  MRI of C-spine was done that showed discitis of C5-C6.  PICC line was inserted on 11/13/2022 and he was to continue antibiotics for 6 weeks.  Syncope felt to be due to orthostatic hypotension.  He was also found to be in new onset atrial fibrillation with RVR.  He was started on IV diltiazem  and spontaneously converted and switched to oral metoprolol  at discharge.  It was noted he was not a candidate for anticoagulation due to recurrent falls.  He was also newly diagnosed with T2DM with A1c of 6.5 and discharged on oral metformin .   He was admitted on 04/20/2023 through 04/24/2023 with acute on chronic heart  failure.  He presented with worsening exertional shortness of breath.  BNP was 2352.  Checks x-ray showed pulmonary congestion.  He was started on IV Lasix .  Echocardiogram showed newly reduced LVEF 40 to 45%, grade 1 diastolic dysfunction, normal RV. Had AKI so cardiac cath was deferred and diuretics were held at discharge.  Entresto  and spironolactone  were not started due to his renal function and beta-blocker use was not started due to severe COPD.   He followed up with advanced heart failure clinic on 05/01/2023.  It is unclear the etiology for the reduction in his EF.  Could be related to recent MSSA sepsis versus uncontrolled hypertension.  He was NYHA class II and volume status okay during clinic visit.  He was started on losartan  25 mg daily.  He was continued on hydralazine  50 mg 3 times daily and Imdur  30 mg daily.  SGLT2i was avoided due to concern for hygiene and volume depletion.  Cardiac MRI was ordered to evaluate for infiltrative disease and is currently pending.   He was seen in clinic on 06/07/2023.  He is doing well at the time.  His blood pressure was elevated in office at 164/74.  His losartan  was discontinued and he was started on Entresto  49-51 mg twice daily and spironolactone  12.5 mg daily.  Follow-up lab work on 05/19/2023 showed worsening creatinine of 1.6.  Spironolactone  was discontinued at that time.   Cardiac MRI 06/06/2023 showed LVEF 42%, moderate to severe aortic regurgitation consistent with aortic regurgitation associated with left ventricular dysfunction, evidence of mild ascending dilation of aorta measuring 43 mm.  Patient was seen on 07/20/2023.  Patient was overall asymptomatic with chronic stable dyspnea.  He was started on bisoprolol  5 mg daily.  TEE was scheduled for further evaluation of his severe aortic regurgitation.  TEE was completed on 07/24/2023 showing oscillating linear density (0.7-0.8 cm) on left coronary cusp of aortic valve concerning for vegetation and  severe aortic regurgitation, LVEF 50 to 55%, no RWMA, mild mitral valve regurgitation, moderate grade 3 plaque involving the ascending aorta.  Blood cultures were drawn and he was referred to infectious disease.  Seen by ID on 08/03/2023 for suspected the AV vegetation (with negative blood cultures and no sign of sepsis).  Per ID it was suspected that the AV vegetation is residual from prior infection on 11/2018 for MSSA BSI.  Plan was discussed with cardiology and was agreed not to start IV antibiotics given likelihood of residual infection.   He recently had his labs drawn with PCP on 7/14 showing creatinine of 2.18, GFR 38, and potassium 5.5.  His Jardiance  was subsequently held at that time.  Patient was last seen in clinic on 08/29/2023.  Patient was doing well at the time without any acute complaints.  Remains relatively asymptomatic.  Repeat BMET was placed showing continued elevated potassium of 5.4 and creatinine of 2.21.  He was given a coupon for Lokelma  and Entresto  was reduced to 24-26 mg twice daily.  He was referred to nephrology.  He was seen by cardiothoracic surgery on 08/30/2023.  Per cardiothoracic surgery aortic valve replacement was suggested to prevent progressive left ventricular dysfunction and worsening heart failure symptoms related to severe aortic insufficiency.  This would have required open surgery because he is not a candidate for TAVR with a possible vegetation and pure aortic insufficiency.  Patient reported he would like to hold off on surgery for now we will continue medical therapy and observation.   Discussed the use of AI scribe software for clinical note transcription with the patient, who gave verbal consent to proceed.  History of Present Illness Michael Bender is a 78 year old male with severe aortic regurgitation and heart failure who presents for follow-up.  Today patient presents with his son, [name].  Today patient reports no acute cardiovascular issues or  complaints.  He is considering valve replacement surgery but is concerned about the associated risks and recovery.  He tells me he would like to hold off for surgery and continue medical therapy as he feels his condition does not change his current lifestyle.  He experiences no significant symptoms such as shortness of breath, chest pain, lightheadedness, leg swelling, dizziness, orthopnea, PND or syncope during daily activities and maintains an active lifestyle, walking half a mile twice daily without limitations. He has COPD, which is managed without impacting his daily life or cardiovascular symptoms.  Review of systems:  Please see the history of present illness. All other systems are reviewed and otherwise negative.      Studies Reviewed:        TEE 07/24/2023  1. Oscillating, linear density (0.7-0.8 cm) on left coronary cusp of  aortic valve concerning for vegetation; severe AI (best seen on  transgastric views).   2. Left ventricular ejection fraction, by estimation, is 50 to 55%. The  left ventricle has low normal function. The left ventricle has no regional  wall motion abnormalities. The left ventricular internal cavity size was  mildly dilated.   3. Right ventricular systolic function is mildly reduced. The right  ventricular size is  normal.   4. Left atrial size was moderately dilated. No left atrial/left atrial  appendage thrombus was detected.   5. The mitral valve is normal in structure. Mild mitral valve  regurgitation.   6. The aortic valve is tricuspid. Aortic valve regurgitation is severe.   7. There is Moderate (Grade III) plaque involving the descending aorta.   8. 3D performed of the aortic valve.    Cardiac MRI 06/06/2023 IMPRESSION: 1. Mild decrease left ventricular systolic function (LVEF =42%).   2. Moderate to severe regurgitation with regurgitant fraction 35% with moderate left ventricular dilation. This is markedly different that color Doppler evaluation on  recent echocardiogram. Consider TEE assessment as this MRI is consistent with aortic regurgitation associated left ventricular dysfunction.   3. Evidence of mild ascending aortic dilation, 43 mm.   Echocardiogram 04/21/2023  1. Left ventricular ejection fraction, by estimation, is 40 to 45%. The  left ventricle has mildly decreased function. The left ventricle  demonstrates global hypokinesis. There is mild left ventricular  hypertrophy. Left ventricular diastolic parameters  are consistent with Grade I diastolic dysfunction (impaired relaxation).   2. Right ventricular systolic function is normal. The right ventricular  size is normal. There is mildly elevated pulmonary artery systolic  pressure. The estimated right ventricular systolic pressure is 38.0 mmHg.   3. Moderate pleural effusion in both left and right lateral regions.   4. The mitral valve is abnormal. Trivial mitral valve regurgitation.   5. The aortic valve is tricuspid. Aortic valve regurgitation is trivial.  Aortic valve sclerosis/calcification is present, without any evidence of  aortic stenosis.   6. The inferior vena cava is normal in size with greater than 50%  respiratory variability, suggesting right atrial pressure of 3 mmHg.   Echocardiogram 11/07/2022 1. Left ventricular ejection fraction, by estimation, is 60 to 65%. The  left ventricle has normal function. The left ventricle has no regional  wall motion abnormalities. Left ventricular diastolic parameters are  consistent with Grade II diastolic  dysfunction (pseudonormalization).   2. Right ventricular systolic function is normal. The right ventricular  size is normal. There is normal pulmonary artery systolic pressure.   3. The mitral valve is normal in structure. No evidence of mitral valve  regurgitation. No evidence of mitral stenosis.   4. The aortic valve is tricuspid. Aortic valve regurgitation is not  visualized. No aortic stenosis is present.   5.  The inferior vena cava is normal in size with greater than 50%  respiratory variability, suggesting right atrial pressure of 3 mmHg.   Risk Assessment/Calculations:     HYPERTENSION CONTROL Vitals:   10/10/23 0900 10/10/23 1242  BP: (!) 154/60 (!) 142/58    The patient's blood pressure is elevated above target today.  In order to address the patient's elevated BP: Blood pressure will be monitored at home to determine if medication changes need to be made.           Physical Exam:   VS:  BP (!) 142/58 (BP Location: Right Arm, Patient Position: Sitting, Cuff Size: Normal)   Pulse 67   Ht 5' 11 (1.803 m)   Wt 153 lb (69.4 kg)   SpO2 96%   BMI 21.34 kg/m    Wt Readings from Last 3 Encounters:  10/10/23 153 lb (69.4 kg)  09/25/23 156 lb 12.8 oz (71.1 kg)  09/14/23 155 lb (70.3 kg)    GEN: Well nourished, well developed in no acute distress NECK: No JVD; No carotid  bruits CARDIAC: RRR, no murmurs, rubs, gallops RESPIRATORY:  Clear to auscultation without rales, wheezing or rhonchi  ABDOMEN: Soft, non-tender, non-distended EXTREMITIES:  No edema; No acute deformity      Assessment and Plan:  HFimpEF Echocardiogram 11/2022 with LVEF 60 to 65% Echocardiogram 04/2023 with LVEF 40 to 45% Cardiac MRI 06/2023 with LVEF 42% and RVEF 48% with no LGE or evidence of infiltrative disease with severe aortic regurgitation associated with LV dysfunction TEE 07/2023 showed LVEF 50 to 55%, severe aortic valve regurgitation, oscillating linear density (0.7-0.8 cm) on left coronary cusp of aortic valve concerning for vegetation - Evaluated by ID on 08/2023 who suspected his aortic valve vegetation is residual from prior infection on 11/2022 with MSSA BSI where he was appropriately treated with IV antibiotics.  Dr. Okey and ID agreed no need to restart antibiotic treatment given negative blood cultures and no signs of sepsis - Most recent TEE did show improvement in his LVEF - Jardiance  previously  stopped due to CKD - Spironolactone  contradicted given CKD and h/o hyperkalemia - Unable to uptitrate Entresto  given CKD and hyperkalemia - Today patient remains euvolemic and well compensated on exam with no acute complaints.  No volume overload and not requiring daily loop diuretic therapy.  NYHA class II (chronic stable dyspnea h/o COPD).  Denies SOB at rest, orthopnea, PND, chest pains.  Remains active without limitation walking a half a mile twice daily - Continue current GDMT of bisoprolol  5 mg daily and Entresto  24-26 mg twice daily - Continue hydralazine  50 mg 3 times daily and Imdur  60 mg daily  - Seen by CT surgery on 7/30 who recommended aortic valve replacement.  Patient prefers not to proceed with valve surgery at this time and will continue medical therapy and observation - Plan for echocardiogram x 3 months to reevaluate LV function   Severe aortic regurgitation Bacterial endocarditis Echocardiogram 04/2023 showed trivial aortic valve regurgitation Cardiac MRI 06/2023 showed moderate to severe regurgitation with regurgitant fraction of 35% with moderate left ventricular dilation consistent with aortic regurgitation associated left ventricular dysfunction TEE 07/2023 showed LVEF 50 to 55%, severe aortic valve regurgitation, oscillating linear density (0.7-0.8 cm) on left coronary cusp aortic valve concerning for vegetation Blood cultures on 07/2023 were negative. - Evaluated by ID on 08/2023 who suspected aortic valve vegetation is residual from prior infection on 11/2022 with MSSA BSI.  Dr. Okey and ID agreed no need to restart antibiotic treatment - Today he is overall asymptomatic.  He denies any syncope, presyncope, dizziness, lightheadedness. Chronic DOE has remained stable - Seen by cardiothoracic surgery on 7/30.  Aortic valve replacement was thought to be the best treatment for him to prevent progressive left ventricular dysfunction.  Per patient he plans not to proceed with valve  surgery at this time and continue with medical therapy and observation - Plan for echocardiogram x 3 months to reassess LV function   CKD stage IIIb Creatinine 1.88 and GFR 36 on 09/2023 Avoid NSAIDs and stay adequately hydrated - Has been referred to nephrology - BMET today   Hypertension Blood pressure today is 142/58 Did not take BP medications prior to visit.  His son will begin taking BP at home and alert office for consistent BP >140 - Continue HF GDMT of bisoprolol  5 mg daily and Entresto  24-20 mg twice daily - Continue amlodipine  5 mg daily, hydralazine  50 mg every 8 hours, Imdur  60 mg daily   Paroxysmal atrial fibrillation Episode noted during admission with sepsis/MSSA bacteremia  on 11/2022 and spontaneously converted to sinus rhythm.  Was noted not to be a candidate for anticoagulation due to recurrent falls at the time as he was also noted to have history of GI bleed due to duodenal ulcer in 2021 - Patient denies any symptoms concerning for recurrent atrial fibrillation  Hyperlipidemia LDL 36 on 08/2023 and well-controlled - Continue rosuvastatin  40 mg   Iron deficiency anemia Hemoglobin 9.9 and hematocrit 30 on 09/2023 - Well-controlled - Continue iron/vitamin supplement      Dispo:  Return in about 4 months (around 02/09/2024).  Signed, Lum LITTIE Louis, NP

## 2023-10-10 NOTE — Patient Instructions (Addendum)
 Medication Instructions:  NO CHANGES   Lab Work: CBC AND BM,ET TO BE DONE TODAY.   Testing/Procedures: TO BE DONE IN 3 MONTHS  Your physician has requested that you have an echocardiogram. Echocardiography is a painless test that uses sound waves to create images of your heart. It provides your doctor with information about the size and shape of your heart and how well your heart's chambers and valves are working. This procedure takes approximately one hour. There are no restrictions for this procedure. Please do NOT wear cologne, perfume, aftershave, or lotions (deodorant is allowed). Please arrive 15 minutes prior to your appointment time.  Please note: We ask at that you not bring children with you during ultrasound (echo/ vascular) testing. Due to room size and safety concerns, children are not allowed in the ultrasound rooms during exams. Our front office staff cannot provide observation of children in our lobby area while testing is being conducted. An adult accompanying a patient to their appointment will only be allowed in the ultrasound room at the discretion of the ultrasound technician under special circumstances. We apologize for any inconvenience.   Follow-Up: At [facility], you and your health needs are our priority.  As part of our continuing mission to provide you with exceptional heart care, our providers are all part of one team.  This team includes your primary Cardiologist (physician) and Advanced Practice Providers or APPs (Physician Assistants and Nurse Practitioners) who all work together to provide you with the care you need, when you need it.  Your next appointment:   1 MONTH POST ECHOCARDIOGRAM  Provider:   Vina Gull, MD OR Lum Louis, NP

## 2023-10-11 LAB — BASIC METABOLIC PANEL WITH GFR
BUN/Creatinine Ratio: 16 (ref 10–24)
BUN: 38 mg/dL — ABNORMAL HIGH (ref 8–27)
CO2: 18 mmol/L — ABNORMAL LOW (ref 20–29)
Calcium: 8.3 mg/dL — ABNORMAL LOW (ref 8.6–10.2)
Chloride: 100 mmol/L (ref 96–106)
Creatinine, Ser: 2.38 mg/dL — ABNORMAL HIGH (ref 0.76–1.27)
Glucose: 465 mg/dL — ABNORMAL HIGH (ref 70–99)
Potassium: 5.4 mmol/L — ABNORMAL HIGH (ref 3.5–5.2)
Sodium: 132 mmol/L — ABNORMAL LOW (ref 134–144)
eGFR: 27 mL/min/1.73 — ABNORMAL LOW (ref >59)

## 2023-10-11 LAB — CBC
Hematocrit: 30.6 — AB (ref 37.5–51.0)
Hemoglobin: 9.7 g/dL — AB (ref 13.0–17.7)
MCH: 30.7 pg (ref 26.6–33.0)
MCHC: 31.7 g/dL (ref 31.5–35.7)
MCV: 97 fL (ref 79–97)
Platelets: 144 x10E3/uL — AB (ref 150–450)
RBC: 3.16 x10E6/uL — AB (ref 4.14–5.80)
RDW: 13.1 (ref 11.6–15.4)
WBC: 7.2 x10E3/uL (ref 3.4–10.8)

## 2023-10-12 ENCOUNTER — Ambulatory Visit (INDEPENDENT_AMBULATORY_CARE_PROVIDER_SITE_OTHER): Admitting: Internal Medicine

## 2023-10-12 ENCOUNTER — Encounter: Payer: Self-pay | Admitting: Internal Medicine

## 2023-10-12 VITALS — BP 160/60 | HR 72 | Temp 97.3°F | Ht 71.0 in | Wt 153.2 lb

## 2023-10-12 DIAGNOSIS — M5441 Lumbago with sciatica, right side: Secondary | ICD-10-CM

## 2023-10-12 DIAGNOSIS — G479 Sleep disorder, unspecified: Secondary | ICD-10-CM | POA: Diagnosis not present

## 2023-10-12 DIAGNOSIS — E875 Hyperkalemia: Secondary | ICD-10-CM

## 2023-10-12 DIAGNOSIS — E119 Type 2 diabetes mellitus without complications: Secondary | ICD-10-CM | POA: Diagnosis not present

## 2023-10-12 DIAGNOSIS — G2581 Restless legs syndrome: Secondary | ICD-10-CM | POA: Insufficient documentation

## 2023-10-12 DIAGNOSIS — N184 Chronic kidney disease, stage 4 (severe): Secondary | ICD-10-CM | POA: Diagnosis not present

## 2023-10-12 DIAGNOSIS — Z794 Long term (current) use of insulin: Secondary | ICD-10-CM | POA: Diagnosis not present

## 2023-10-12 DIAGNOSIS — R809 Proteinuria, unspecified: Secondary | ICD-10-CM | POA: Diagnosis not present

## 2023-10-12 DIAGNOSIS — G8929 Other chronic pain: Secondary | ICD-10-CM

## 2023-10-12 DIAGNOSIS — M5442 Lumbago with sciatica, left side: Secondary | ICD-10-CM | POA: Diagnosis not present

## 2023-10-12 LAB — HEMOGLOBIN A1C: Hgb A1c MFr Bld: 8.4 % — ABNORMAL HIGH (ref 4.6–6.5)

## 2023-10-12 MED ORDER — FREESTYLE LIBRE 3 PLUS SENSOR MISC: 11 refills | Status: AC

## 2023-10-12 MED ORDER — OZEMPIC (0.25 OR 0.5 MG/DOSE) 2 MG/3ML ~~LOC~~ SOPN: 0.2500 mg | PEN_INJECTOR | SUBCUTANEOUS | 5 refills | Status: DC

## 2023-10-12 MED ORDER — LANCETS MISC. MISC: 1.0000 | Freq: Three times a day (TID) | 0 refills | Status: DC

## 2023-10-12 MED ORDER — MELATONIN 1 MG PO CAPS: 1.0000 | ORAL_CAPSULE | Freq: Every evening | ORAL | 30 refills | Status: DC

## 2023-10-12 MED ORDER — ROPINIROLE HCL 0.25 MG PO TABS: 0.2500 mg | ORAL_TABLET | Freq: Three times a day (TID) | ORAL | 2 refills | Status: DC

## 2023-10-12 MED ORDER — LANCET DEVICE MISC: 1.0000 | Freq: Three times a day (TID) | 0 refills | Status: DC

## 2023-10-12 MED ORDER — BLOOD GLUCOSE TEST VI STRP: 1.0000 | ORAL_STRIP | Freq: Three times a day (TID) | 0 refills | Status: DC

## 2023-10-12 MED ORDER — BLOOD GLUCOSE MONITORING SUPPL DEVI: 1.0000 | Freq: Three times a day (TID) | 0 refills | Status: AC

## 2023-10-12 NOTE — Assessment & Plan Note (Signed)
 Chronic pain syndrome is managed with oxycodone . Multiple spinal fusions with bone-on-bone contact above and below the fusion site contribute to pain during ambulation. Potential surgical options, including disc replacement, are discussed, but cardiac status may not support surgery. Previous attempts at spinal stimulation were unsuccessful due to scar tissue, and potential for laser ablation is limited by previous ganglionectomies. Current management with narcotics and potential injections is deemed reasonable. Plan includes continuing oxycodone  for pain management, considering referral to a pain management specialist for additional support and documentation, and discussing potential for injections if pain becomes unmanageable.

## 2023-10-12 NOTE — Assessment & Plan Note (Signed)
 Chronic kidney disease stage 4 is noted with worsening creatinine levels (2.4 mg/dL from 2.2 mg/dL), likely exacerbated by uncontrolled diabetes. There is no current nephrologist follow-up due to lack of contact from a previous referral. An urgent referral to nephrologist Dr. Tobie for kidney management is necessary, along with regular blood work to monitor kidney function.

## 2023-10-12 NOTE — Assessment & Plan Note (Signed)
 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 A1c is expected to be significantly elevated. There is a possibility of type 1 diabetes due to pancreatic damage, but further testing is needed for confirmation. He is hesitant to start insulin  therapy immediately, preferring oral medication first. Ozempic  (semaglutide ) is discussed as it addresses multiple comorbidities including heart failure and kidney disease. Plan includes ordering blood work to determine the type of diabetes and assess current metabolic status, prescribing Ozempic  for diabetes management, educating on a low sugar diet and the importance of hydration, and monitoring blood glucose levels with a continuous glucose monitor (Libre 3 plus) if available.

## 2023-10-12 NOTE — Assessment & Plan Note (Signed)
 Hyperkalemia and hyponatremia secondary to diabetes and chronic kidney disease   Hyperkalemia and hyponatremia are likely secondary to diabetes and chronic kidney disease, with previously elevated potassium levels and low sodium levels. He has run out of a potassium-lowering powder. Plan includes rechecking potassium and sodium levels with blood work, advising on a low potassium diet and increased water intake, and allowing increased sodium intake to address hyponatremia.

## 2023-10-12 NOTE — Patient Instructions (Addendum)
 It was a pleasure seeing you today! Your health and satisfaction are our top priorities.  Michael Cone, MD  VISIT SUMMARY: During your visit, we discussed your chronic back pain, sleep disturbances, and newly diagnosed diabetes. We reviewed your recent lab results and discussed your current medications and potential new treatments. We also talked about your chronic kidney disease and electrolyte imbalances.  YOUR PLAN: -DIABETES MELLITUS DUE TO PANCREATIC INJURY: Diabetes mellitus is a condition where your blood sugar levels are too high. This is likely due to damage to your pancreas from a previous infection. We will start you on Ozempic  to help manage your blood sugar levels and order blood work to determine the type of diabetes you have. It's important to follow a low sugar diet and stay hydrated. We will also monitor your blood sugar levels with a continuous glucose monitor if available.  -CHRONIC KIDNEY DISEASE STAGE 4 WITH PROTEINURIA: Chronic kidney disease stage 4 means your kidneys are not working well. This is likely made worse by your uncontrolled diabetes. We will refer you to a kidney specialist, Dr. Tobie, and regularly check your kidney function with blood work.  -HYPERKALEMIA AND HYPONATREMIA: Hyperkalemia means you have high potassium levels, and hyponatremia means you have low sodium levels. Both are likely due to your diabetes and kidney disease. We will recheck your potassium and sodium levels with blood work. You should follow a low potassium diet, drink more water, and increase your sodium intake.  -SLEEP DISTURBANCE AND RESTLESS LEGS SYNDROME: Your sleep issues and restless legs are likely due to your diabetes and electrolyte imbalances. We will prescribe Requip  for your restless legs and recommend melatonin to help you sleep. Addressing your blood sugar and electrolyte levels should also help improve your sleep.  -CHRONIC PAIN SYNDROME: Chronic pain syndrome means you have  ongoing pain, likely from your back issues. We will continue your oxycodone  for pain management and consider referring you to a pain specialist for additional support. We also discussed potential injections if your pain becomes unmanageable.  INSTRUCTIONS: Please follow up with the kidney specialist, Dr. Tobie, as soon as possible. We will also need to do regular blood work to monitor your kidney function and electrolyte levels. Continue to monitor your blood sugar levels and follow the low sugar diet we discussed. If you have any new symptoms or your pain becomes unmanageable, please contact us  immediately.  Your Providers PCP: [name], MD,  [phone]) Referring Provider: [name], MD,  [phone]) Care Team Provider: Evonnie Asberry RAMAN, DO,  [phone]) Care Team Provider: Fleeta Rothman, Jomarie SAILOR, MD,  [phone]) Care Team Provider: Avram Lupita BRAVO, MD,  [phone]) Care Team Provider: Okey Vina GAILS, MD,  [phone]) Care Team Provider: [name], NP,  [phone])  NEXT STEPS: [x]  Early Intervention: Schedule sooner appointment, call our on-call services, or go to emergency room if there is any significant Increase in pain or discomfort New or worsening symptoms Sudden or severe changes in your health [x]  Flexible Follow-Up: We recommend a No follow-ups on file. for optimal routine care. This allows for progress monitoring and treatment adjustments. [x]  Preventive Care: Schedule your annual preventive care visit! It's typically covered by insurance and helps identify potential health issues early. [x]  Lab & X-ray Appointments: Incomplete tests scheduled today, or call to schedule. X-rays: [facility] (M-F, 8:30am-noon or 1pm-5pm). [x]  Medical Information Release: Sign a release form at front desk to obtain relevant medical information we don't have.  MAKING THE MOST OF OUR FOCUSED 20 MINUTE APPOINTMENTS: [x]   Clearly state your  top concerns at the beginning of the visit to focus our discussion [x]   If you anticipate you will need more time, please inform the front desk during scheduling - we can book multiple appointments in the same week. [x]   If you have transportation problems- use our convenient video appointments or ask about transportation support. [x]   We can get down to business faster if you use MyChart to update information before the visit and submit non-urgent questions before your visit. Thank you for taking the time to provide details through MyChart.  Let our nurse know and she can import this information into your encounter documents.  Arrival and Wait Times: [x]   Arriving on time ensures that everyone receives prompt attention. [x]   Early morning (8a) and afternoon (1p) appointments tend to have shortest wait times. [x]   Unfortunately, we cannot delay appointments for late arrivals or hold slots during phone calls.  Getting Answers and Following Up [x]   Simple Questions & Concerns: For quick questions or basic follow-up after your visit, reach us  at [phone] or MyChart messaging. [x]   Complex Concerns: If your concern is more complex, scheduling an appointment might be best. Discuss this with the staff to find the most suitable option. [x]   Lab & Imaging Results: We'll contact you directly if results are abnormal or you don't use MyChart. Most normal results will be on MyChart within 2-3 business days, with a review message from Dr. Jesus. Haven't heard back in 2 weeks? Need results sooner? Contact us  at [phone]. [x]   Referrals: Our referral coordinator will manage specialist referrals. The specialist's office should contact you within 2 weeks to schedule an appointment. Call us  if you haven't heard from them after 2 weeks.  Staying Connected [x]   MyChart: Activate your MyChart for the fastest way to access results and message us . See the last page of this paperwork for instructions on how  to activate.  Bring to Your Next Appointment [x]   Medications: Please bring all your medication bottles to your next appointment to ensure we have an accurate record of your prescriptions. [x]   Health Diaries: If you're monitoring any health conditions at home, keeping a diary of your readings can be very helpful for discussions at your next appointment.  Billing [x]   X-ray & Lab Orders: These are billed by separate companies. Contact the invoicing company directly for questions or concerns. [x]   Visit Charges: Discuss any billing inquiries with our administrative services team.  Your Satisfaction Matters [x]   Share Your Experience: We strive for your satisfaction! If you have any complaints, or preferably compliments, please let Dr. Jesus know directly or contact our Practice Administrators, Manuelita Rubin or [employer], by asking at the front desk.   Reviewing Your Records [x]   Review this early draft of your clinical encounter notes below and the final encounter summary tomorrow on MyChart after its been completed.  All orders placed so far are visible here: Diabetes mellitus, new onset (HCC) -     Hemoglobin A1c -     Anti-islet cell antibody -     Glutamic acid decarboxylase auto abs -     IA-2 Autoantibodies -     ZNT8 Antibodies -     Ozempic  (0.25 or 0.5 MG/DOSE); Inject 0.25 mg into the skin once a week.  Dispense: 3 mL; Refill: 5 -     FreeStyle Libre 3 Plus Sensor; Change sensor  every 15 days.  Dispense: 2 each; Refill: 11 -     Blood Glucose Monitoring Suppl; 1 each by Does not apply route in the morning, at noon, and at bedtime. May substitute to any manufacturer covered by patient's insurance.  Dispense: 1 each; Refill: 0 -     Blood Glucose Test; 1 each by In Vitro route in the morning, at noon, and at bedtime. May substitute to any manufacturer covered by patient's insurance.  Dispense: 100 strip; Refill: 0 -     Lancet Device; 1 each by Does not apply route in the  morning, at noon, and at bedtime. May substitute to any manufacturer covered by patient's insurance.  Dispense: 1 each; Refill: 0 -     Lancets Misc.; 1 each by Does not apply route in the morning, at noon, and at bedtime. May substitute to any manufacturer covered by patient's insurance.  Dispense: 100 each; Refill: 0 -     Urinalysis, Complete  Sleep disturbance -     Urinalysis, Complete -     rOPINIRole  HCl; Take 1 tablet (0.25 mg total) by mouth 3 (three) times daily.  Dispense: 30 tablet; Refill: 2 -     Melatonin; Take 1 capsule (1 mg total) by mouth at bedtime.  Dispense: 90 capsule; Refill: 30  Chronic kidney disease, stage 4 (severe) (HCC) -     Ambulatory referral to Nephrology  Restless legs  Hyperkalemia  Microalbuminuria  Chronic bilateral low back pain with bilateral sciatica         CKD Electrolyte Diet Plan  Patient Context:  78 year old with stage 4 CKD, diabetes, and electrolyte disturbances (notably hyperkalemia) contributing to sleep disruption.  Dietary Recommendations:  - Protein:   Target dietary protein intake at 0.6-0.8 g/kg/day, as recommended by KDIGO and the American Diabetes Association for CKD stages 3-5, to slow GFR decline and minimize uremic symptoms. Avoid high-protein diets (>1.3 g/kg/day), which accelerate CKD progression and worsen albuminuria.[1][2][3]  - Potassium:   Individualize potassium restriction based on serum levels and risk of hyperkalemia.  - Limit foods with high bioavailable potassium (processed foods, meats, dairy, juices, salt substitutes with potassium chloride ). [4]  - Prefer fresh fruits and vegetables with lower potassium content (e.g., apples, berries, green beans, cabbage) and avoid those with high potassium (e.g., bananas, oranges, potatoes, tomatoes) if hyperkalemia is present. [4]  - Use cooking methods such as soaking and boiling to reduce potassium content in vegetables. [4]  - Education on potassium  content and food labeling is essential.[4]  - Sodium:   Restrict sodium intake to <2,300 mg/day (ideally <2,000 mg/day) to manage blood pressure, reduce cardiovascular risk, and minimize fluid overload.[1][2][3]  - Avoid processed foods, canned soups, salty snacks, and restaurant meals.  - Phosphorus:   Limit dietary phosphorus, especially from highly bioavailable sources (processed foods, cola beverages, meats, dairy).[2]  - Prefer plant-based sources of phosphorus, which are less bioavailable. [5][4][2]  - Consider phosphate binders if dietary restriction is insufficient.  - Calcium  and Vitamin D:   Ensure adequate elemental calcium  intake and vitamin D supplementation if hypocalcemia or deficiency is present, as per JAMA review.[2]  - Acid Load:   Emphasize a diet lower in acid load:  - Increase intake of fruits and vegetables, decrease intake of meats, eggs, and cheeses. [2]  - Consider oral bicarbonate supplementation if serum bicarbonate <22 mmol/L.[2]  - General Dietary Pattern:   - Adopt a plant-forward, Mediterranean, or whole foods plant-based diet, with a focus on unprocessed  foods, as recommended by KDIGO and the American Diabetes Association. [5][4][3][6]  - Avoid ultraprocessed foods and refined carbohydrates.[4][3][6]  - Sleep and Electrolyte Management:   - Correct electrolyte abnormalities (especially hyperkalemia, hypocalcemia, and metabolic acidosis) as these             References 11. Chronic Kidney Disease and Risk Management: Standards of Care in Diabetes-2025. Diabetes Care. 2025;48(Supplement_1):S239-S251. doi:10.2337/dc25-S011. Chronic Kidney Disease Diagnosis and Management: A Review. Chen TK, Knicely DH, Grams ME. JAMA. 2019;322(13):1294-1304. doi:10.1001/jama.7980.85254. Chronic Kidney Disease in Diabetes: Guidelines from KDIGO. American Academy of Family Physicians [phone]). KDIGO 2024 Clinical Practice Guideline for the Evaluation and  Management of Chronic Kidney Disease. Kidney International. 2024;105(4S):S117-S314. doi:10.1016/j.kint.2023.10.018. Chronic Kidney Disease: Role of Diet for a Reduction in the Severity of the Disease. Naber T, Purohit S. Nutrients. 2021;13(9):3277. doi:10.3390/nu13093277. Physical Activity and Nutrition in Chronic Kidney Disease. Cesario PETTIES, March DS, Ann AMBLE. Current Opinion in Clinical Nutrition and Metabolic Care. 2023;26(4):385-392. doi:10.1097/MCO.0000000000000947.  AVOID POTASSIUM    Here's a concise table of common potassium-rich foods:  Food Approx. Potassium (mg per serving) Serving Size White beans   1,000 mg 1 cup (cooked) Lentils    730 mg 1 cup (cooked) Baked potato (with skin) 925 mg 1 medium Sweet potato   540 mg 1 medium Spinach   840 mg 1 cup (cooked) Swiss chard   960 mg 1 cup (cooked) Avocado   975 mg 1 whole Banana   420 mg 1 medium Orange juice   500 mg 1 cup Cantaloupe   430 mg 1 cup (cubed) Yogurt (plain, low-fat) 575 mg 1 cup Salmon   620 mg 3 oz (cooked) Chicken breast  440 mg 3 oz (cooked) Mushrooms (white)  425 mg 1 cup (cooked) Tomato sauce  725 mg 1 cup  Rationale: Potassium is abundant in beans, leafy greens, starchy vegetables, fruits, dairy, and lean meats. The listed foods balance plant and animal sources.  Alternatives: Other good sources include prunes, dried apricots, clams, cod, edamame, pumpkin, and beets.  Next actions:  Use this table to guide diet planning. If for medical purposes (e.g., kidney disease), potassium intake should be personalized -- check labs before major dietary shifts.  It was a pleasure seeing you today! Your health and satisfaction are our top priorities.  Michael Cone, MD     Your Providers PCP: [name], MD,  [phone]) Referring Provider: [name], MD,  [phone]) Care Team Provider: Evonnie Asberry RAMAN, DO,  [phone]) Care Team Provider: Fleeta Rothman, Jomarie SAILOR, MD,  [phone]) Care  Team Provider: Avram Lupita BRAVO, MD,  [phone]) Care Team Provider: Okey Vina GAILS, MD,  [phone]) Care Team Provider: [name], NP,  [phone])  NEXT STEPS: [x]  Early Intervention: Schedule sooner appointment, call our on-call services, or go to emergency room if there is any significant Increase in pain or discomfort New or worsening symptoms Sudden or severe changes in your health [x]  Flexible Follow-Up: We recommend a No follow-ups on file. for optimal routine care. This allows for progress monitoring and treatment adjustments. [x]  Preventive Care: Schedule your annual preventive care visit! It's typically covered by insurance and helps identify potential health issues early. [x]  Lab & X-ray Appointments: Incomplete tests scheduled today, or call to schedule. X-rays: [facility] (M-F, 8:30am-noon or 1pm-5pm). [x]  Medical Information Release: Sign a release form at front desk to obtain relevant medical information we don't have.  MAKING THE MOST OF OUR FOCUSED 20 MINUTE APPOINTMENTS: [x]   Clearly  state your top concerns at the beginning of the visit to focus our discussion [x]   If you anticipate you will need more time, please inform the front desk during scheduling - we can book multiple appointments in the same week. [x]   If you have transportation problems- use our convenient video appointments or ask about transportation support. [x]   We can get down to business faster if you use MyChart to update information before the visit and submit non-urgent questions before your visit. Thank you for taking the time to provide details through MyChart.  Let our nurse know and she can import this information into your encounter documents.  Arrival and Wait Times: [x]   Arriving on time ensures that everyone receives prompt attention. [x]   Early morning (8a) and afternoon (1p) appointments tend to have shortest wait times. [x]   Unfortunately, we cannot delay  appointments for late arrivals or hold slots during phone calls.  Getting Answers and Following Up [x]   Simple Questions & Concerns: For quick questions or basic follow-up after your visit, reach us  at [phone] or MyChart messaging. [x]   Complex Concerns: If your concern is more complex, scheduling an appointment might be best. Discuss this with the staff to find the most suitable option. [x]   Lab & Imaging Results: We'll contact you directly if results are abnormal or you don't use MyChart. Most normal results will be on MyChart within 2-3 business days, with a review message from Dr. Jesus. Haven't heard back in 2 weeks? Need results sooner? Contact us  at [phone]. [x]   Referrals: Our referral coordinator will manage specialist referrals. The specialist's office should contact you within 2 weeks to schedule an appointment. Call us  if you haven't heard from them after 2 weeks.  Staying Connected [x]   MyChart: Activate your MyChart for the fastest way to access results and message us . See the last page of this paperwork for instructions on how to activate.  Bring to Your Next Appointment [x]   Medications: Please bring all your medication bottles to your next appointment to ensure we have an accurate record of your prescriptions. [x]   Health Diaries: If you're monitoring any health conditions at home, keeping a diary of your readings can be very helpful for discussions at your next appointment.  Billing [x]   X-ray & Lab Orders: These are billed by separate companies. Contact the invoicing company directly for questions or concerns. [x]   Visit Charges: Discuss any billing inquiries with our administrative services team.  Your Satisfaction Matters [x]   Share Your Experience: We strive for your satisfaction! If you have any complaints, or preferably compliments, please let Dr. Jesus know directly or contact our Practice Administrators, Manuelita Rubin or [employer], by asking at  the front desk.   Reviewing Your Records [x]   Review this early draft of your clinical encounter notes below and the final encounter summary tomorrow on MyChart after its been completed.  All orders placed so far are visible here: Diabetes mellitus, new onset (HCC) -     Hemoglobin A1c -     Anti-islet cell antibody -     Glutamic acid decarboxylase auto abs -     IA-2 Autoantibodies -     ZNT8 Antibodies -     Ozempic  (0.25 or 0.5 MG/DOSE); Inject 0.25 mg into the skin once a week.  Dispense: 3 mL; Refill: 5 -     FreeStyle Libre 3 Plus Sensor; Change sensor every 15 days.  Dispense: 2 each; Refill: 11 -  Blood Glucose Monitoring Suppl; 1 each by Does not apply route in the morning, at noon, and at bedtime. May substitute to any manufacturer covered by patient's insurance.  Dispense: 1 each; Refill: 0 -     Blood Glucose Test; 1 each by In Vitro route in the morning, at noon, and at bedtime. May substitute to any manufacturer covered by patient's insurance.  Dispense: 100 strip; Refill: 0 -     Lancet Device; 1 each by Does not apply route in the morning, at noon, and at bedtime. May substitute to any manufacturer covered by patient's insurance.  Dispense: 1 each; Refill: 0 -     Lancets Misc.; 1 each by Does not apply route in the morning, at noon, and at bedtime. May substitute to any manufacturer covered by patient's insurance.  Dispense: 100 each; Refill: 0 -     Urinalysis, Complete  Sleep disturbance -     Urinalysis, Complete -     rOPINIRole  HCl; Take 1 tablet (0.25 mg total) by mouth 3 (three) times daily.  Dispense: 30 tablet; Refill: 2 -     Melatonin; Take 1 capsule (1 mg total) by mouth at bedtime.  Dispense: 90 capsule; Refill: 30  Chronic kidney disease, stage 4 (severe) (HCC) -     Ambulatory referral to Nephrology  Restless legs  Hyperkalemia  Microalbuminuria

## 2023-10-12 NOTE — Assessment & Plan Note (Signed)
  Sleep disturbance and restless legs syndrome secondary to metabolic derangements   Sleep disturbance and restless legs syndrome are likely secondary to metabolic derangements from diabetes and electrolyte imbalances, with reports of difficulty falling asleep. High glucose levels and electrolyte imbalances likely contribute to sleep issues. Requip  is prescribed for restless legs syndrome, melatonin is recommended for sleep support, and addressing underlying metabolic derangements is necessary to improve sleep quality.

## 2023-10-12 NOTE — Progress Notes (Signed)
 ==============================  [facility]: [phone]   -- Medical Office Visit --  Patient: Michael Bender      Age: 78 y.o.       Sex:  male  Date:   10/12/2023 Today's Healthcare Provider: Bernardino KANDICE Cone, MD  ==============================   Chief Complaint: Chronic bilateral low back pain  But labs show new onset diabetes mellitus severe with electrolyte abnormal   Discussed the use of AI scribe software for clinical note transcription with the patient, who gave verbal consent to proceed.  History of Present Illness 78 year old male with chronic back pain and diabetes who presents with sleep disturbances and elevated blood sugar levels.  He has chronic back pain that worsens halfway through his walks. He has a history of spinal fusion, and recent x-rays showed bone-on-bone contact above and below the fusion site. He is currently on prednisone to reduce inflammation. Various treatments have been tried in the past, including a spinal stimulator and ganglionectomy for migraines, but these were not successful due to scar tissue and other complications. Chronic opioid therapy for pain management, specifically oxycodone , is ongoing, and he has a high pain tolerance.  He has not been able to sleep for the past two nights, attributing this to factors other than pain. He experiences restless legs and has not been on any specific medication for it. Sleep disturbances are noted, with difficulty falling asleep. No pain is reported as a cause of his recent sleep issues.  He was recently diagnosed with diabetes following a cardiology visit where his blood sugar was found to be extremely high at 465 mg/dL. Recent lab work showed elevated creatinine levels, indicating worsening kidney function, and abnormal electrolytes, including high potassium and low sodium. He has a history of sepsis. Symptoms consistent with diabetes, such as increased thirst and  urination, are present. His diet primarily consists of ice cream and sugary foods, which may be contributing to his elevated blood sugar levels.  Lab Results  Component Value Date   GFR 67.73 05/03/2022   GFR 78.99 12/14/2020   GFR 69.77 02/06/2019   GFR 79.23 10/13/2015   GFR 104.35 09/08/2015   GFR 91.16 04/09/2014   Lab Results  Component Value Date   EGFR 27 (L) 10/10/2023   EGFR 30 (L) 08/30/2023   EGFR 32 (L) 08/29/2023   EGFR 30 (L) 08/14/2023   EGFR 37 (L) 07/20/2023   EGFR 44 (L) 06/07/2023   EGFR 39 (L) 02/22/2023   EGFR 40 (L) 01/17/2023   EGFR 38 (L) 12/28/2022   Lab Results  Component Value Date   HGBA1C 8.4 (H) 10/12/2023   HGBA1C 6.5 (H) 11/08/2022   HGBA1C 6.4 05/03/2022      Background Reviewed: Problem List: has GERD; CONSTIPATION, CHRONIC; Primary hypertension; Bilateral leg pain; Esophageal dysphagia; Insomnia; Peripheral arterial disease (HCC); History of CVA (cerebrovascular accident); Anemia; HLD (hyperlipidemia); COPD (chronic obstructive pulmonary disease) (HCC); Iron deficiency anemia; Duodenal stenosis; Opioid dependence (HCC); Atherosclerosis of aorta (HCC); High risk medication use; Gynecomastia, male; Chronic bilateral low back pain with bilateral sciatica; Neck pain; Memory loss; Frequent falls; Nonintractable headache; Balance problems; Chronic heart failure with preserved ejection fraction (HFpEF, >= 50%) (HCC); Orthostatic hypotension; Stage 3 chronic kidney disease (HCC); Elevated BUN; 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 Past Medical History:  has a past medical history of Acute combined systolic and diastolic heart failure (HCC) (96/77/7974), Acute on chronic diastolic CHF (congestive  heart failure) (HCC) (04/20/2023), AKI (acute kidney injury) (HCC) (10/28/2022), Arthritis, Balance problems (02/21/2023), Blood transfusion, Cachexia (HCC) (05/03/2022), Cervical discitis (11/10/2022), Chronic back pain, COPD (chronic obstructive pulmonary disease) (HCC), Disturbance of skin sensation (05/22/2018), Effusion of right knee (02/21/2023), Effusion of right knee joint (01/16/2023), Elevated troponin (04/22/2023), Essential hypertension (03/05/2007), Finger pain, left (11/08/2022), Gastric erosion, Gastritis and gastroduodenitis, Gastrointestinal hemorrhage with melena (11/20/2018), GIB (gastrointestinal bleeding) (07/18/2019), Hardware complicating wound infection (HCC) (11/10/2022), Headache(784.0), Hemorrhoids, internal, High risk medication use (03/03/2022), History of duodenal ulcer, History of fusion of cervical spine (03/03/2022), History of lumbar fusion (03/03/2022), History of upper gastrointestinal bleeding (03/03/2022), HLD (hyperlipidemia), Hypertension, Hypokalemia (04/22/2023), Hypomagnesemia (04/22/2023), Infected blister of left index finger (11/07/2022), Intractable pain (03/03/2022), Loss of weight, MSSA bacteremia (11/07/2022), Neck rigidity, Nocturia, PAIN, CHRONIC NEC (10/06/2006), Pneumonia, Prostate disease, Right sided temporal headache (05/22/2018), S/P cervical spinal fusion (03/03/2022), Senile ecchymosis (01/21/2019), Septic arthritis of wrist, left (HCC) (11/08/2022), Septic infrapatellar bursitis of right knee (11/07/2022), Staphylococcal arthritis of left wrist (HCC) (11/07/2022), Staphylococcal arthritis of right knee (HCC) (11/08/2022), Syncope (11/05/2022), and Underweight on examination (05/03/2022). Past Surgical History:   has a past surgical history that includes Cervical fusion (1992); Spinal fusion (05/06/11); Colonoscopy; Esophagogastroduodenoscopy (07/20/2011); Savory dilation (07/20/2011); biopsy (07/19/2019); Esophagogastroduodenoscopy (egd) with propofol  (N/A,  07/19/2019); I & D extremity (Left, 11/09/2022); and TRANSESOPHAGEAL ECHOCARDIOGRAM (N/A, 07/24/2023). Social History:   reports that he quit smoking about 4 years ago. His smoking use included cigarettes. He started smoking about 44 years ago. He has never used smokeless tobacco. He reports that he does not currently use alcohol after a past usage of about 2.0 standard drinks of alcohol per week. He reports that he does not use drugs. Family History:  family history includes Heart attack (age of onset: 34) in his mother; Heart disease in his mother; Pancreatic cancer in his brother; Pancreatic cancer (age of onset: 15) in his father. Allergies:  is allergic to nubain [nalbuphine hcl].   Medication Reconciliation: Current Outpatient Medications on File Prior to Visit  Medication Sig   amitriptyline  (ELAVIL ) 50 MG tablet Take 1 tablet (50 mg total) by mouth at bedtime.   amLODipine  (NORVASC ) 5 MG tablet Take 1 tablet (5 mg total) by mouth daily.   bisoprolol  (ZEBETA ) 5 MG tablet Take 1 tablet (5 mg total) by mouth daily.   cefadroxil  (DURICEF) 500 MG capsule Take 500 mg by mouth 2 (two) times daily.   cyclobenzaprine  (FLEXERIL ) 10 MG tablet Take 1 tablet (10 mg total) by mouth 3 (three) times daily as needed.   FeFum-FePoly-FA-B Cmp-C-Biot (INTEGRA PLUS ) CAPS Take 1 capsule by mouth daily.   hydrALAZINE  (APRESOLINE ) 50 MG tablet Take 1 tablet (50 mg total) by mouth every 8 (eight) hours.   ipratropium-albuterol  (DUONEB) 0.5-2.5 (3) MG/3ML SOLN Take 3 mLs by nebulization every 4 (four) hours as needed.   isosorbide  mononitrate (IMDUR ) 60 MG 24 hr tablet Take 1 tablet (60 mg total) by mouth daily.   Oxycodone  HCl 10 MG TABS Take 1 tablet (10 mg total) by mouth every 8 (eight) hours as needed.   pantoprazole  (PROTONIX ) 40 MG tablet Take 1 tablet (40 mg total) by mouth daily.   polyethylene glycol (MIRALAX  / GLYCOLAX ) 17 g packet Take 17 g by mouth daily as needed for moderate constipation.   Respiratory  Therapy Supplies (NEBULIZER/TUBING/MOUTHPIECE) KIT 1  each by Does not apply route every 4 (four) hours as needed.   Respiratory Therapy Supplies (NEBULIZER/TUBING/MOUTHPIECE) KIT 1 each by Does not apply route every 4 (four) hours as needed.   rosuvastatin  (CRESTOR ) 40 MG tablet TAKE 1 TABLET BY MOUTH DAILY. REPLACES ATORVASTATIN  (STOP ATORVASTATIN  IF STILL TAKING)   sacubitril -valsartan  (ENTRESTO ) 24-26 MG Take 1 tablet by mouth 2 (two) times daily.   sodium zirconium cyclosilicate  (LOKELMA ) 10 g PACK packet Take 10 g by mouth daily.   SUMAtriptan  (IMITREX ) 50 MG tablet TAKE 1 TABLET (50 MG TOTAL) BY MOUTH DAILY. MAY REPEAT IN 2 HOURS IF HEADACHE PERSISTS OR RECURS.   No current facility-administered medications on file prior to visit.  There are no discontinued medications.   Physical Exam:    10/12/2023   11:06 AM 10/10/2023   12:42 PM 10/10/2023    9:00 AM  Vitals with BMI  Height 5' 11  5' 11  Weight 153 lbs 3 oz  153 lbs  BMI 21.38  21.35  Systolic 160 142 845  Diastolic 60 58 60  Pulse 72  67  Vital signs reviewed.  Nursing notes reviewed. Weight trend reviewed. Physical Activity: Insufficiently Active (10/13/2022)   Exercise Vital Sign    Days of Exercise per Week: 3 days    Minutes of Exercise per Session: 30 min   General Appearance:  No acute distress appreciable.   Well-groomed, healthy-appearing male.  Well proportioned with no abnormal fat distribution.  Good muscle tone. Pulmonary:  Normal work of breathing at rest, no respiratory distress apparent. SpO2: 98 %  Musculoskeletal: All extremities are intact.  Neurological:  Awake, alert, oriented, and engaged.  No obvious focal neurological deficits or cognitive impairments.  Sensorium seems unclouded.   Speech is clear and coherent with logical content. Psychiatric:  Appropriate mood, pleasant and cooperative demeanor, thoughtful and engaged during the exam   Verbalized to patient: Physical  Exam    Results:   Verbalized to patient: Results LABS Creatinine: 2.4 (10/10/2023) Glucose: 465 (10/10/2023) Red Blood Cells: Low (10/10/2023) Potassium: High (10/10/2023) Sodium: Low (10/10/2023) A1c: 6.5  RADIOLOGY Spinal X-ray: Spinal fusion appears intact, with bone-on-bone contact above and below the fusion.  DIAGNOSTIC Transesophageal Echocardiogram (TEE): Vegetation on valve.     04/19/2023   10:47 AM 03/22/2023    9:40 AM 02/17/2023   11:42 AM 01/17/2023   10:13 AM  PHQ 2/9 Scores  PHQ - 2 Score 0 0 2 0  PHQ- 9 Score  0 3    Office Visit on 10/12/2023  Component Date Value Ref Range Status   Hgb A1c MFr Bld 10/12/2023 8.4 (H)  4.6 - 6.5 % Final  Office Visit on 10/10/2023  Component Date Value Ref Range Status   WBC 10/10/2023 7.2  3.4 - 10.8 x10E3/uL Final   RBC 10/10/2023 3.16 (L)  4.14 - 5.80 x10E6/uL Final   Hemoglobin 10/10/2023 9.7 (L)  13.0 - 17.7 g/dL Final   Hematocrit 90/90/7974 30.6 (L)  37.5 - 51.0 % Final   MCV 10/10/2023 97  79 - 97 fL Final   MCH 10/10/2023 30.7  26.6 - 33.0 pg Final   MCHC 10/10/2023 31.7  31.5 - 35.7 g/dL Final   RDW 90/90/7974 13.1  11.6 - 15.4 % Final   Platelets 10/10/2023 144 (L)  150 - 450 x10E3/uL Final   Glucose 10/10/2023 465 (H)  70 - 99 mg/dL Final   BUN 90/90/7974 38 (H)  8 - 27 mg/dL Final   Creatinine,  Ser 10/10/2023 2.38 (H)  0.76 - 1.27 mg/dL Final   eGFR 90/90/7974 27 (L)  >59 mL/min/1.73 Final   BUN/Creatinine Ratio 10/10/2023 16  10 - 24 Final   Sodium 10/10/2023 132 (L)  134 - 144 mmol/L Final   Potassium 10/10/2023 5.4 (H)  3.5 - 5.2 mmol/L Final   Chloride 10/10/2023 100  96 - 106 mmol/L Final   CO2 10/10/2023 18 (L)  20 - 29 mmol/L Final   Calcium  10/10/2023 8.3 (L)  8.6 - 10.2 mg/dL Final  Office Visit on 09/25/2023  Component Date Value Ref Range Status   Amphetamines 09/25/2023 NEGATIVE  <500 ng/mL Final   Barbiturates 09/25/2023 NEGATIVE  <300 ng/mL Final   Benzodiazepines 09/25/2023  NEGATIVE  <100 ng/mL Final   Cocaine Metabolite 09/25/2023 NEGATIVE  <150 ng/mL Final   Marijuana Metabolite 09/25/2023 POSITIVE (A)  <20 ng/mL Final   Methadone Metabolite 09/25/2023 POSITIVE (A)  <100 ng/mL Final   Opiates 09/25/2023 POSITIVE (A)  <100 ng/mL Final   Oxycodone  09/25/2023 POSITIVE (A)  <100 ng/mL Final   Creatinine 09/25/2023 84.9  > or = 20.0 mg/dL Final   pH 91/74/7974 6.7  4.5 - 9.0 Final   Oxidant 09/25/2023 NEGATIVE  <200 mcg/mL Final   Notes and Comments 09/25/2023    Final  Admission on 09/14/2023, Discharged on 09/14/2023  Component Date Value Ref Range Status   Sodium 09/14/2023 139  135 - 145 mmol/L Final   Potassium 09/14/2023 4.2  3.5 - 5.1 mmol/L Final   Chloride 09/14/2023 105  98 - 111 mmol/L Final   CO2 09/14/2023 22  22 - 32 mmol/L Final   Glucose, Bld 09/14/2023 148 (H)  70 - 99 mg/dL Final   BUN 91/85/7974 27 (H)  8 - 23 mg/dL Final   Creatinine, Ser 09/14/2023 1.88 (H)  0.61 - 1.24 mg/dL Final   Calcium  09/14/2023 8.8 (L)  8.9 - 10.3 mg/dL Final   Total Protein 91/85/7974 6.6  6.5 - 8.1 g/dL Final   Albumin 91/85/7974 3.7  3.5 - 5.0 g/dL Final   AST 91/85/7974 31  15 - 41 U/L Final   ALT 09/14/2023 10  0 - 44 U/L Final   Alkaline Phosphatase 09/14/2023 94  38 - 126 U/L Final   Total Bilirubin 09/14/2023 0.4  0.0 - 1.2 mg/dL Final   GFR, Estimated 09/14/2023 36 (L)  >60 mL/min Final   Anion gap 09/14/2023 12  5 - 15 Final   WBC 09/14/2023 5.9  4.0 - 10.5 K/uL Final   RBC 09/14/2023 3.24 (L)  4.22 - 5.81 MIL/uL Final   Hemoglobin 09/14/2023 9.9 (L)  13.0 - 17.0 g/dL Final   HCT 91/85/7974 30.0 (L)  39.0 - 52.0 % Final   MCV 09/14/2023 92.6  80.0 - 100.0 fL Final   MCH 09/14/2023 30.6  26.0 - 34.0 pg Final   MCHC 09/14/2023 33.0  30.0 - 36.0 g/dL Final   RDW 91/85/7974 14.0  11.5 - 15.5 % Final   Platelets 09/14/2023 115 (L)  150 - 400 K/uL Final   nRBC 09/14/2023 0.0  0.0 - 0.2 % Final   Glucose-Capillary 09/14/2023 146 (H)  70 - 99 mg/dL Final   Results Follow-Up on 08/30/2023  Component Date Value Ref Range Status   Glucose 08/30/2023 140 (H)  70 - 99 mg/dL Final   BUN 92/69/7974 35 (H)  8 - 27 mg/dL Final   Creatinine, Ser 08/30/2023 2.21 (H)  0.76 - 1.27 mg/dL Final  eGFR 08/30/2023 30 (L)  >59 mL/min/1.73 Final   BUN/Creatinine Ratio 08/30/2023 16  10 - 24 Final   Sodium 08/30/2023 137  134 - 144 mmol/L Final   Potassium 08/30/2023 5.4 (H)  3.5 - 5.2 mmol/L Final   Chloride 08/30/2023 103  96 - 106 mmol/L Final   CO2 08/30/2023 20  20 - 29 mmol/L Final   Calcium  08/30/2023 8.2 (L)  8.6 - 10.2 mg/dL Final  Office Visit on 08/29/2023  Component Date Value Ref Range Status   Glucose 08/29/2023 106 (H)  70 - 99 mg/dL Final   BUN 92/70/7974 32 (H)  8 - 27 mg/dL Final   Creatinine, Ser 08/29/2023 2.10 (H)  0.76 - 1.27 mg/dL Final   eGFR 92/70/7974 32 (L)  >59 mL/min/1.73 Final   BUN/Creatinine Ratio 08/29/2023 15  10 - 24 Final   Sodium 08/29/2023 135  134 - 144 mmol/L Final   Potassium 08/29/2023 5.4 (H)  3.5 - 5.2 mmol/L Final   Chloride 08/29/2023 101  96 - 106 mmol/L Final   CO2 08/29/2023 21  20 - 29 mmol/L Final   Calcium  08/29/2023 8.4 (L)  8.6 - 10.2 mg/dL Final   WBC 92/70/7974 6.6  3.4 - 10.8 x10E3/uL Final   RBC 08/29/2023 3.28 (L)  4.14 - 5.80 x10E6/uL Final   Hemoglobin 08/29/2023 10.3 (L)  13.0 - 17.7 g/dL Final   Hematocrit 92/70/7974 31.0 (L)  37.5 - 51.0 % Final   MCV 08/29/2023 95  79 - 97 fL Final   MCH 08/29/2023 31.4  26.6 - 33.0 pg Final   MCHC 08/29/2023 33.2  31.5 - 35.7 g/dL Final   RDW 92/70/7974 13.9  11.6 - 15.4 % Final   Platelets 08/29/2023 164  150 - 450 x10E3/uL Final  Office Visit on 08/14/2023  Component Date Value Ref Range Status   WBC 08/14/2023 8.1  3.4 - 10.8 x10E3/uL Final   RBC 08/14/2023 4.96  4.14 - 5.80 x10E6/uL Final   Hemoglobin 08/14/2023 14.8  13.0 - 17.7 g/dL Final   Hematocrit 92/85/7974 46.5  37.5 - 51.0 % Final   MCV 08/14/2023 94  79 - 97 fL Final   MCH 08/14/2023  29.8  26.6 - 33.0 pg Final   MCHC 08/14/2023 31.8  31.5 - 35.7 g/dL Final   RDW 92/85/7974 14.1  11.6 - 15.4 % Final   Platelets 08/14/2023 160  150 - 450 x10E3/uL Final   Neutrophils 08/14/2023 50  Not Estab. % Final   Lymphs 08/14/2023 32  Not Estab. % Final   Monocytes 08/14/2023 8  Not Estab. % Final   Eos 08/14/2023 9  Not Estab. % Final   Basos 08/14/2023 1  Not Estab. % Final   Neutrophils Absolute 08/14/2023 4.1  1.4 - 7.0 x10E3/uL Final   Lymphocytes Absolute 08/14/2023 2.6  0.7 - 3.1 x10E3/uL Final   Monocytes Absolute 08/14/2023 0.7  0.1 - 0.9 x10E3/uL Final   EOS (ABSOLUTE) 08/14/2023 0.7 (H)  0.0 - 0.4 x10E3/uL Final   Basophils Absolute 08/14/2023 0.1  0.0 - 0.2 x10E3/uL Final   Immature Granulocytes 08/14/2023 0  Not Estab. % Final   Immature Grans (Abs) 08/14/2023 0.0  0.0 - 0.1 x10E3/uL Final   Glucose 08/14/2023 94  70 - 99 mg/dL Final   BUN 92/85/7974 34 (H)  8 - 27 mg/dL Final   Creatinine, Ser 08/14/2023 2.18 (H)  0.76 - 1.27 mg/dL Final   eGFR 92/85/7974 30 (L)  >59 mL/min/1.73 Final  BUN/Creatinine Ratio 08/14/2023 16  10 - 24 Final   Sodium 08/14/2023 136  134 - 144 mmol/L Final   Potassium 08/14/2023 5.5 (H)  3.5 - 5.2 mmol/L Final   Chloride 08/14/2023 106  96 - 106 mmol/L Final   CO2 08/14/2023 18 (L)  20 - 29 mmol/L Final   Calcium  08/14/2023 8.0 (L)  8.6 - 10.2 mg/dL Final   Total Protein 92/85/7974 6.3  6.0 - 8.5 g/dL Final   Albumin 92/85/7974 3.6 (L)  3.8 - 4.8 g/dL Final   Globulin, Total 08/14/2023 2.7  1.5 - 4.5 g/dL Final   Bilirubin Total 08/14/2023 <0.2  0.0 - 1.2 mg/dL Final   Alkaline Phosphatase 08/14/2023 100  44 - 121 IU/L Final   AST 08/14/2023 40  0 - 40 IU/L Final   ALT 08/14/2023 20  0 - 44 IU/L Final   Retic Ct Pct 08/14/2023 1.1  0.6 - 2.6 % Final   Total Iron Binding Capacity 08/14/2023 203 (L)  250 - 450 ug/dL Final   UIBC 92/85/7974 151  111 - 343 ug/dL Final   Iron 92/85/7974 52  38 - 169 ug/dL Final   Iron Saturation  08/14/2023 26  15 - 55 % Final   Ferritin 08/14/2023 216  30 - 400 ng/mL Final   Vitamin B-12 08/14/2023 348  232 - 1,245 pg/mL Final   Folate 08/14/2023 9.7  >3.0 ng/mL Final   Albumin ELP 08/14/2023 3.5  2.9 - 4.4 g/dL Final   Alpha 1 92/85/7974 0.2  0.0 - 0.4 g/dL Final   Alpha 2 92/85/7974 0.6  0.4 - 1.0 g/dL Final   Beta 92/85/7974 0.8  0.7 - 1.3 g/dL Final   Gamma Globulin 08/14/2023 1.2  0.4 - 1.8 g/dL Final   M-Spike, % 92/85/7974 Not Observed  Not Observed g/dL Final   Globulin, Total 08/14/2023 2.8  2.2 - 3.9 g/dL Final   A/G Ratio 92/85/7974 1.3  0.7 - 1.7 Final   Please Note: 08/14/2023 Comment   Final   Interpretation: 08/14/2023 Comment   Final   Haptoglobin 08/14/2023 172  34 - 355 mg/dL Final   Coombs', Direct 08/14/2023 Negative  Negative Final   Erythropoietin  08/14/2023 11.8  2.6 - 18.5 mIU/mL Final   PTH 08/14/2023 28  15 - 65 pg/mL Final   Phosphorus 08/14/2023 4.9 (H)  2.8 - 4.1 mg/dL Final   Cholesterol 92/85/7974 93  0 - 200 mg/dL Final   Triglycerides 92/85/7974 58.0  0.0 - 149.0 mg/dL Final   HDL 92/85/7974 46.00  >39.00 mg/dL Final   VLDL 92/85/7974 11.6  0.0 - 40.0 mg/dL Final   LDL Cholesterol 08/14/2023 36  0 - 99 mg/dL Final   Total CHOL/HDL Ratio 08/14/2023 2   Final   NonHDL 08/14/2023 47.33   Final  Admission on 07/24/2023, Discharged on 07/24/2023  Component Date Value Ref Range Status   Est EF 07/24/2023 50 - 55%   Final   Specimen Description 07/24/2023 BLOOD LEFT ARM   Final   Special Requests 07/24/2023 BOTTLES DRAWN AEROBIC AND ANAEROBIC Blood Culture adequate volume   Final   Culture 07/24/2023    Final                   Value:NO GROWTH 5 DAYS Performed at [facility], [address], [place], [place] [address]    Report Status 07/24/2023 07/29/2023 FINAL   Final   Specimen Description 07/24/2023 BLOOD RIGHT ARM   Final  Special Requests 07/24/2023 BOTTLES DRAWN AEROBIC AND ANAEROBIC Blood Culture adequate volume   Final   Culture  07/24/2023    Final                   Value:NO GROWTH 5 DAYS Performed at [facility], [address], [place], [place] [address]    Report Status 07/24/2023 07/29/2023 FINAL   Final  Lab on 07/21/2023  Component Date Value Ref Range Status   WBC 07/21/2023 4.6  4.0 - 10.5 K/uL Final   RBC 07/21/2023 3.25 (L)  4.22 - 5.81 Mil/uL Final   Hemoglobin 07/21/2023 9.7 (L)  13.0 - 17.0 g/dL Final   HCT 93/79/7974 28.8 (L)  39.0 - 52.0 % Final   MCV 07/21/2023 88.8  78.0 - 100.0 fl Final   MCHC 07/21/2023 33.7  30.0 - 36.0 g/dL Final   RDW 93/79/7974 14.6  11.5 - 15.5 % Final   Platelets 07/21/2023 129.0 (L)  150.0 - 400.0 K/uL Final   Neutrophils Relative % 07/21/2023 58.9  43.0 - 77.0 % Final   Lymphocytes Relative 07/21/2023 23.3  12.0 - 46.0 % Final   Monocytes Relative 07/21/2023 8.4  3.0 - 12.0 % Final   Eosinophils Relative 07/21/2023 9.0 (H)  0.0 - 5.0 % Final   Basophils Relative 07/21/2023 0.4  0.0 - 3.0 % Final   Neutro Abs 07/21/2023 2.7  1.4 - 7.7 K/uL Final   Lymphs Abs 07/21/2023 1.1  0.7 - 4.0 K/uL Final   Monocytes Absolute 07/21/2023 0.4  0.1 - 1.0 K/uL Final   Eosinophils Absolute 07/21/2023 0.4  0.0 - 0.7 K/uL Final   Basophils Absolute 07/21/2023 0.0  0.0 - 0.1 K/uL Final  Office Visit on 07/20/2023  Component Date Value Ref Range Status   Glucose 07/20/2023 119 (H)  70 - 99 mg/dL Final   BUN 93/80/7974 30 (H)  8 - 27 mg/dL Final   Creatinine, Ser 07/20/2023 1.85 (H)  0.76 - 1.27 mg/dL Final   eGFR 93/80/7974 37 (L)  >59 mL/min/1.73 Final   BUN/Creatinine Ratio 07/20/2023 16  10 - 24 Final   Sodium 07/20/2023 135  134 - 144 mmol/L Final   Potassium 07/20/2023 5.2  3.5 - 5.2 mmol/L Final   Chloride 07/20/2023 100  96 - 106 mmol/L Final   CO2 07/20/2023 21  20 - 29 mmol/L Final   Calcium  07/20/2023 8.4 (L)  8.6 - 10.2 mg/dL Final  There may be more visits with results that are not included.  No image results found. DG Thoracic Spine W/Swimmers Result Date:  10/03/2023 CLINICAL DATA:  Back pain without known injury. EXAM: THORACIC SPINE - 3 VIEWS COMPARISON:  None Available. FINDINGS: There is no evidence of thoracic spine fracture. Alignment is normal. No other significant bone abnormalities are identified. IMPRESSION: Negative. Electronically Signed   By: Lynwood Landy Raddle M.D.   On: 10/03/2023 08:27   DG Cervical Spine Complete Result Date: 10/03/2023 CLINICAL DATA:  Cervical pain without known injury. EXAM: CERVICAL SPINE - COMPLETE 4+ VIEW COMPARISON:  February 17, 2023. FINDINGS: Status post surgical posterior fusion of C2-3. Minimal grade 1 anterolisthesis of C4-5 is noted secondary to posterior facet joint hypertrophy. Moderate degenerative disc disease noted at C5-6 with anterior osteophyte formation. No fracture or prevertebral soft tissue swelling is noted. Mild bilateral neural foraminal stenosis is noted at C4-5 and C5-6 secondary to uncovertebral spurring. IMPRESSION: Postsurgical and degenerative changes as described above. No acute abnormality seen. Electronically Signed  By: Lynwood Landy Raddle M.D.   On: 10/03/2023 08:26   ECHO TEE Result Date: 07/24/2023    TRANSESOPHOGEAL ECHO REPORT   Patient Name:   Michael Bender Date of Exam: 07/24/2023 Medical Rec #:  [id number]        Height:       71.0 in Accession #:    [id number]       Weight:       150.0 lb Date of Birth:  1945/08/17       BSA:          1.866 m Patient Age:    47 years         BP:           142/44 mmHg Patient Gender: M                HR:           50 bpm. Exam Location:  Outpatient Procedure: Transesophageal Echo, Cardiac Doppler, Color Doppler and 3D Echo            (Both Spectral and Color Flow Doppler were utilized during            procedure). Indications:     aortic insufficiency  History:         Patient has prior history of Echocardiogram examinations, most                  recent 04/21/2023. Risk Factors:Hypertension.  Sonographer:     Philomena Daring Sonographer#2:   Tinnie Barefoot  RDCS Referring Phys:  [id number] [place] [facility] [place] Diagnosing Phys: Redell Shallow MD PROCEDURE: After discussion of the risks and benefits of a TEE, an informed consent was obtained from the patient. The transesophogeal probe was passed without difficulty through the esophogus of the patient. Imaged were obtained with the patient in a left lateral decubitus position. Sedation performed by different physician. The patient was monitored while under deep sedation. Anesthestetic sedation was provided intravenously by Anesthesiology: 300mg  of Propofol . Image quality was excellent. The patient developed no complications during the procedure.  IMPRESSIONS  1. Oscillating, linear density (0.7-0.8 cm) on left coronary cusp of aortic valve concerning for vegetation; severe AI (best seen on transgastric views).  2. Left ventricular ejection fraction, by estimation, is 50 to 55%. The left ventricle has low normal function. The left ventricle has no regional wall motion abnormalities. The left ventricular internal cavity size was mildly dilated.  3. Right ventricular systolic function is mildly reduced. The right ventricular size is normal.  4. Left atrial size was moderately dilated. No left atrial/left atrial appendage thrombus was detected.  5. The mitral valve is normal in structure. Mild mitral valve regurgitation.  6. The aortic valve is tricuspid. Aortic valve regurgitation is severe.  7. There is Moderate (Grade III) plaque involving the descending aorta.  8. 3D performed of the aortic valve. FINDINGS  Left Ventricle: Left ventricular ejection fraction, by estimation, is 50 to 55%. The left ventricle has low normal function. The left ventricle has no regional wall motion abnormalities. The left ventricular internal cavity size was mildly dilated. Right Ventricle: The right ventricular size is normal. Right ventricular systolic function is mildly reduced. Left Atrium: Left atrial size was moderately dilated. No left  atrial/left atrial appendage thrombus was detected. Right Atrium: Right atrial size was normal in size. Pericardium: There is no evidence of pericardial effusion. Mitral Valve: The mitral valve is normal in structure. Mild mitral valve regurgitation. Tricuspid  Valve: The tricuspid valve is normal in structure. Tricuspid valve regurgitation is trivial. Aortic Valve: The aortic valve is tricuspid. Aortic valve regurgitation is severe. Pulmonic Valve: The pulmonic valve was normal in structure. Pulmonic valve regurgitation is not visualized. Aorta: The aortic root is normal in size and structure. There is moderate (Grade III) plaque involving the descending aorta. IAS/Shunts: No atrial level shunt detected by color flow Doppler. Additional Comments: Oscillating, linear density (0.7-0.8 cm) on left coronary cusp of aortic valve concerning for vegetation; severe AI (best seen on transgastric views).  AORTA Ao Root diam: 3.50 cm Ao Asc diam:  3.80 cm Redell Shallow MD Electronically signed by Redell Shallow MD Signature Date/Time: 07/24/2023/12:17:18 PM    Final    EP STUDY Result Date: 07/24/2023 See surgical note for result.        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 A1c is expected to be significantly elevated. There is a possibility of type 1 diabetes due to pancreatic damage, but further testing is needed for confirmation. He is hesitant to start insulin  therapy immediately, preferring oral medication first. Ozempic  (semaglutide ) is discussed as it addresses multiple comorbidities including heart failure and kidney disease. Plan includes ordering blood work to determine the type  of diabetes and assess current metabolic status, prescribing Ozempic  for diabetes management, educating on a low sugar diet and the importance of hydration, and monitoring blood glucose levels with a continuous glucose monitor (Libre 3 plus) if available. Sleep disturbance  Sleep disturbance and restless legs syndrome secondary to metabolic derangements   Sleep disturbance and restless legs syndrome are likely secondary to metabolic derangements from diabetes and electrolyte imbalances, with reports of difficulty falling asleep. High glucose levels and electrolyte imbalances likely contribute to sleep issues. Requip  is prescribed for restless legs syndrome, melatonin is recommended for sleep support, and addressing underlying metabolic derangements is necessary to improve sleep quality. Chronic kidney disease, stage 4 (severe) (HCC) Chronic kidney disease stage 4 is noted with worsening creatinine levels (2.4 mg/dL from 2.2 mg/dL), likely exacerbated by uncontrolled diabetes. There is no current nephrologist follow-up due to lack of contact from a previous referral. An urgent referral to nephrologist Dr. Tobie for kidney management is necessary, along with regular blood work to monitor kidney function. Restless legs  Hyperkalemia Hyperkalemia and hyponatremia secondary to diabetes and chronic kidney disease   Hyperkalemia and hyponatremia are likely secondary to diabetes and chronic kidney disease, with previously elevated potassium levels and low sodium levels. He has run out of a potassium-lowering powder. Plan includes rechecking potassium and sodium levels with blood work, advising on a low potassium diet and increased water intake, and allowing increased sodium intake to address hyponatremia. Microalbuminuria No results found for: LABMICR, MICROALBUR Will assess creat worsening.  Chronic bilateral low back pain with bilateral sciatica Chronic pain syndrome is managed with oxycodone .  Multiple spinal fusions with bone-on-bone contact above and below the fusion site contribute to pain during ambulation. Potential surgical options, including disc replacement, are discussed, but cardiac status may not support surgery. Previous attempts at spinal stimulation were unsuccessful due to scar tissue, and potential for laser ablation is limited by previous ganglionectomies. Current management with narcotics and potential injections is deemed  reasonable. Plan includes continuing oxycodone  for pain management, considering referral to a pain management specialist for additional support and documentation, and discussing potential for injections if pain becomes unmanageable.   ORDER ASSOCIATIONS  #   DIAGNOSIS / CONDITION ICD-10 ENCOUNTER ORDER     ICD-10-CM   1. Diabetes mellitus, new onset (HCC)  E11.9 HgB A1c    Anti-islet cell antibody    Glutamic acid decarboxylase auto abs    IA-2 Autoantibodies    ZNT8 Antibodies    Semaglutide ,0.25 or 0.5MG /DOS, (OZEMPIC , 0.25 OR 0.5 MG/DOSE,) 2 MG/3ML SOPN    Continuous Glucose Sensor (FREESTYLE LIBRE 3 PLUS SENSOR) MISC    Blood Glucose Monitoring Suppl DEVI    Glucose Blood (BLOOD GLUCOSE TEST STRIPS) STRP    Lancet Device MISC    Lancets Misc. MISC    Urinalysis, Complete    2. Sleep disturbance  G47.9 Urinalysis, Complete    rOPINIRole  (REQUIP ) 0.25 MG tablet    Melatonin 1 MG CAPS    3. Chronic kidney disease, stage 4 (severe) (HCC)  N18.4 Ambulatory referral to Nephrology    4. Restless legs  G25.81     5. Hyperkalemia  E87.5     6. Microalbuminuria  R80.9     7. Chronic bilateral low back pain with bilateral sciatica  M54.42    M54.41    G89.29           Orders Placed in Encounter:    Meds ordered this encounter  Medications   Semaglutide ,0.25 or 0.5MG /DOS, (OZEMPIC , 0.25 OR 0.5 MG/DOSE,) 2 MG/3ML SOPN    Sig: Inject 0.25 mg into the skin once a week.    Dispense:  3 mL    Refill:  5   Continuous Glucose Sensor  (FREESTYLE LIBRE 3 PLUS SENSOR) MISC    Sig: Change sensor every 15 days.    Dispense:  2 each    Refill:  11   Blood Glucose Monitoring Suppl DEVI    Sig: 1 each by Does not apply route in the morning, at noon, and at bedtime. May substitute to any manufacturer covered by patient's insurance.    Dispense:  1 each    Refill:  0   Glucose Blood (BLOOD GLUCOSE TEST STRIPS) STRP    Sig: 1 each by In Vitro route in the morning, at noon, and at bedtime. May substitute to any manufacturer covered by patient's insurance.    Dispense:  100 strip    Refill:  0   Lancet Device MISC    Sig: 1 each by Does not apply route in the morning, at noon, and at bedtime. May substitute to any manufacturer covered by patient's insurance.    Dispense:  1 each    Refill:  0   Lancets Misc. MISC    Sig: 1 each by Does not apply route in the morning, at noon, and at bedtime. May substitute to any manufacturer covered by patient's insurance.    Dispense:  100 each    Refill:  0   rOPINIRole  (REQUIP ) 0.25 MG tablet    Sig: Take 1 tablet (0.25 mg total) by mouth 3 (three) times daily.    Dispense:  30 tablet    Refill:  2   Melatonin 1 MG CAPS    Sig: Take 1 capsule (1 mg total) by mouth at bedtime.    Dispense:  90 capsule    Refill:  30    Orders Placed This Encounter  Procedures   HgB  A1c   Anti-islet cell antibody   Glutamic acid decarboxylase auto abs   IA-2 Autoantibodies   ZNT8 Antibodies   Urinalysis, Complete   Ambulatory referral to Nephrology    Referral Priority:   Urgent    Referral Type:   Consultation    Referral Reason:   Specialty Services Required    Referred to Provider:   Tobie Gordy POUR, MD    Requested Specialty:   Nephrology    Number of Visits Requested:   1        This document was synthesized by artificial intelligence ([employer]bridge) using HIPAA-compliant recording of the clinical interaction;   We discussed the use of AI scribe software for clinical note transcription with the  patient, who gave verbal consent to proceed. additional Info: This encounter employed state-of-the-art, real-time, collaborative documentation. The patient actively reviewed and assisted in updating their electronic medical record on a shared screen, ensuring transparency and facilitating joint problem-solving for the problem list, overview, and plan. This approach promotes accurate, informed care. The treatment plan was discussed and reviewed in detail, including medication safety, potential side effects, and all patient questions. We confirmed understanding and comfort with the plan. Follow-up instructions were established, including contacting the office for any concerns, returning if symptoms worsen, persist, or new symptoms develop, and precautions for potential emergency department visits.

## 2023-10-15 ENCOUNTER — Ambulatory Visit: Payer: Self-pay | Admitting: Internal Medicine

## 2023-10-15 NOTE — Progress Notes (Signed)
 So far no antibodies and Hemoglobin A1c not as bad as I thought.  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

## 2023-10-16 ENCOUNTER — Ambulatory Visit: Payer: Self-pay | Admitting: Emergency Medicine

## 2023-10-16 DIAGNOSIS — Z79899 Other long term (current) drug therapy: Secondary | ICD-10-CM

## 2023-10-19 ENCOUNTER — Other Ambulatory Visit: Payer: Self-pay | Admitting: Infectious Disease

## 2023-10-19 LAB — GLUTAMIC ACID DECARBOXYLASE AUTO ABS: Glutamic Acid Decarb Ab: <5 [IU]/mL (ref <5)

## 2023-10-19 LAB — ZNT8 ANTIBODIES: ZNT8 Antibodies: <10 U/mL (ref <15)

## 2023-10-20 ENCOUNTER — Other Ambulatory Visit (HOSPITAL_COMMUNITY)

## 2023-10-20 ENCOUNTER — Ambulatory Visit: Admitting: Emergency Medicine

## 2023-10-25 ENCOUNTER — Encounter: Payer: Self-pay | Admitting: Internal Medicine

## 2023-10-25 ENCOUNTER — Telehealth: Admitting: Internal Medicine

## 2023-10-25 DIAGNOSIS — R110 Nausea: Secondary | ICD-10-CM | POA: Diagnosis not present

## 2023-10-25 DIAGNOSIS — M5442 Lumbago with sciatica, left side: Secondary | ICD-10-CM

## 2023-10-25 DIAGNOSIS — N184 Chronic kidney disease, stage 4 (severe): Secondary | ICD-10-CM

## 2023-10-25 DIAGNOSIS — T402X5A Adverse effect of other opioids, initial encounter: Secondary | ICD-10-CM | POA: Diagnosis not present

## 2023-10-25 DIAGNOSIS — Z79899 Other long term (current) drug therapy: Secondary | ICD-10-CM | POA: Diagnosis not present

## 2023-10-25 DIAGNOSIS — M5441 Lumbago with sciatica, right side: Secondary | ICD-10-CM | POA: Diagnosis not present

## 2023-10-25 DIAGNOSIS — Z7985 Long-term (current) use of injectable non-insulin antidiabetic drugs: Secondary | ICD-10-CM

## 2023-10-25 DIAGNOSIS — K5903 Drug induced constipation: Secondary | ICD-10-CM | POA: Diagnosis not present

## 2023-10-25 DIAGNOSIS — M542 Cervicalgia: Secondary | ICD-10-CM | POA: Diagnosis not present

## 2023-10-25 DIAGNOSIS — G8929 Other chronic pain: Secondary | ICD-10-CM

## 2023-10-25 DIAGNOSIS — E119 Type 2 diabetes mellitus without complications: Secondary | ICD-10-CM | POA: Diagnosis not present

## 2023-10-25 LAB — ANTI-ISLET CELL ANTIBODY: Islet Cell Ab: NEGATIVE

## 2023-10-25 LAB — IA-2 AUTOANTIBODIES: IA-2 Autoantibodies: <7.5 U/mL

## 2023-10-25 MED ORDER — OZEMPIC (0.25 OR 0.5 MG/DOSE) 2 MG/3ML ~~LOC~~ SOPN: 0.2500 mg | PEN_INJECTOR | SUBCUTANEOUS | 5 refills | Status: DC

## 2023-10-25 MED ORDER — BISACODYL EC 5 MG PO TBEC: 5.0000 mg | DELAYED_RELEASE_TABLET | Freq: Every day | ORAL | 0 refills | Status: AC | PRN

## 2023-10-25 MED ORDER — ONDANSETRON 4 MG PO TBDP: 4.0000 mg | ORAL_TABLET | Freq: Three times a day (TID) | ORAL | 2 refills | Status: DC | PRN

## 2023-10-25 MED ORDER — OXYCODONE HCL 10 MG PO TABS: 10.0000 mg | ORAL_TABLET | Freq: Three times a day (TID) | ORAL | 0 refills | Status: DC | PRN

## 2023-10-25 MED ORDER — PROMETHAZINE HCL 25 MG RE SUPP: 25.0000 mg | Freq: Four times a day (QID) | RECTAL | 0 refills | Status: DC | PRN

## 2023-10-25 NOTE — Assessment & Plan Note (Signed)
 Chronic pain requiring opioid therapy   Chronic pain is managed with oxycodone , with potential for opioid-induced constipation contributing to nausea and vomiting. Send a fresh prescription for oxycodone  to [facility] [place]. Advise monitoring for constipation and adjusting the bowel regimen as needed.  PDMP reviewed during this encounter.

## 2023-10-25 NOTE — Patient Instructions (Signed)
 It was a pleasure seeing you today! Your health and satisfaction are our top priorities.  Bernardino Cone, MD  VISIT SUMMARY: During your visit, we discussed issues related to your use of Ozempic , including nausea, vomiting, and constipation. We also reviewed your chronic pain management, type 2 diabetes, and stage 4 chronic kidney disease. We addressed your lack of appetite and provided a plan to manage these symptoms.  YOUR PLAN: -NAUSEA AND VOMITING DUE TO MEDICATION SIDE EFFECT: Your nausea and vomiting are likely due to incorrect dosing of Ozempic . To manage this, I have prescribed Zofran  dissolving tablets for nausea and Phenergan  suppositories for severe vomiting. If you experience more than 2-3 episodes of vomiting, please go to the ER. Stay hydrated with Gatorade or Pedialyte.  -CONSTIPATION DUE TO OPIOID AND MEDICATION USE: Your constipation is likely due to the combination of oxycodone  and Ozempic . I have prescribed bisacodyl  (Dulcolax) to be used with Miralax . Please increase your fluid intake and monitor your bowel movements.  -CHRONIC PAIN REQUIRING OPIOID THERAPY: Your chronic pain is managed with oxycodone , which can contribute to constipation. I have sent a fresh prescription [facility]codone  [facility] [place]. Please monitor for constipation and adjust your bowel regimen as needed.  -TYPE 2 DIABETES MELLITUS, ON OZEMPIC : Your type 2 diabetes is managed with Ozempic . We suspect incorrect dosing has led to side effects. Once the pharmacy allows, restart Ozempic  at 0.25 mg. I have sent a prescription for this dose. Monitor for side effects and adjust dosing as needed.  -CHRONIC KIDNEY DISEASE STAGE 4: Your stage 4 chronic kidney disease requires careful monitoring, especially if you become dehydrated from vomiting. Ozempic  may help protect your kidneys if you are not vomiting. We will schedule an in-office appointment for labs to monitor your kidney function.  -APPETITE LOSS DUE TO  MEDICATION AND/OR CONSTIPATION: Your loss of appetite is likely due to medication side effects and constipation. This can lead to nutritional deficiencies if it continues. Please focus on staying hydrated and eating small, frequent meals. Monitor your appetite and let us  know if it does not improve.  INSTRUCTIONS: Please follow up with an in-office appointment for labs to monitor your kidney function. Restart Ozempic  at 0.25 mg once the pharmacy allows and monitor for any side effects. If vomiting persists beyond 2-3 episodes, go to the ER.  Your Providers PCP: Cone Bernardino MATSU, MD,  [phone]) Referring Provider: Cone Bernardino MATSU, MD,  [phone]) Care Team Provider: Evonnie Asberry RAMAN, DO,  [phone]) Care Team Provider: Fleeta Rothman, Jomarie SAILOR, MD,  [phone]) Care Team Provider: Avram Lupita BRAVO, MD,  [phone]) Care Team Provider: Okey Vina GAILS, MD,  [phone]) Care Team Provider: [name], NP,  [phone])  NEXT STEPS: [x]  Early Intervention: Schedule sooner appointment, call our on-call services, or go to emergency room if there is any significant Increase in pain or discomfort New or worsening symptoms Sudden or severe changes in your health [x]  Flexible Follow-Up: We recommend a No follow-ups on file. for optimal routine care. This allows for progress monitoring and treatment adjustments. [x]  Preventive Care: Schedule your annual preventive care visit! It's typically covered by insurance and helps identify potential health issues early. [x]  Lab & X-ray Appointments: Incomplete tests scheduled today, or call to schedule. X-rays: [facility] (M-F, 8:30am-noon or 1pm-5pm). [x]  Medical Information Release: Sign a release form at front desk to obtain relevant medical information we don't have.  MAKING THE MOST OF OUR FOCUSED 20 MINUTE APPOINTMENTS: [x]   Clearly state your top concerns at the beginning of the visit to focus our discussion [x]    If you anticipate you will need more time, please inform the front desk during scheduling - we can book multiple appointments in the same week. [x]   If you have transportation problems- use our convenient video appointments or ask about transportation support. [x]   We can get down to business faster if you use MyChart to update information before the visit and submit non-urgent questions before your visit. Thank you for taking the time to provide details through MyChart.  Let our nurse know and she can import this information into your encounter documents.  Arrival and Wait Times: [x]   Arriving on time ensures that everyone receives prompt attention. [x]   Early morning (8a) and afternoon (1p) appointments tend to have shortest wait times. [x]   Unfortunately, we cannot delay appointments for late arrivals or hold slots during phone calls.  Getting Answers and Following Up [x]   Simple Questions & Concerns: For quick questions or basic follow-up after your visit, reach us  at [phone] or MyChart messaging. [x]   Complex Concerns: If your concern is more complex, scheduling an appointment might be best. Discuss this with the staff to find the most suitable option. [x]   Lab & Imaging Results: We'll contact you directly if results are abnormal or you don't use MyChart. Most normal results will be on MyChart within 2-3 business days, with a review message from Dr. Jesus. Haven't heard back in 2 weeks? Need results sooner? Contact us  at [phone]. [x]   Referrals: Our referral coordinator will manage specialist referrals. The specialist's office should contact you within 2 weeks to schedule an appointment. Call us  if you haven't heard from them after 2 weeks.  Staying Connected [x]   MyChart: Activate your MyChart for the fastest way to access results and message us . See the last page of this paperwork for instructions on how to activate.  Bring to Your Next Appointment [x]   Medications: Please  bring all your medication bottles to your next appointment to ensure we have an accurate record of your prescriptions. [x]   Health Diaries: If you're monitoring any health conditions at home, keeping a diary of your readings can be very helpful for discussions at your next appointment.  Billing [x]   X-ray & Lab Orders: These are billed by separate companies. Contact the invoicing company directly for questions or concerns. [x]   Visit Charges: Discuss any billing inquiries with our administrative services team.  Your Satisfaction Matters [x]   Share Your Experience: We strive for your satisfaction! If you have any complaints, or preferably compliments, please let Dr. Jesus know directly or contact our Practice Administrators, Manuelita Rubin or [employer], by asking at the front desk.   Reviewing Your Records [x]   Review this early draft of your clinical encounter notes below and the final encounter summary tomorrow on MyChart after its been completed.  All orders placed so far are visible here: Nausea -     Ondansetron ; Take 1 tablet (4 mg total) by mouth every 8 (eight) hours as needed for nausea or vomiting.  Dispense: 60 tablet; Refill: 2 -     Ozempic  (0.25 or 0.5 MG/DOSE); Inject 0.25 mg into the skin once a week.  Dispense: 3 mL; Refill: 5 -     Promethazine  HCl; Place 1 suppository (25 mg total) rectally every 6 (six) hours as needed for nausea or vomiting.  Dispense: 12 each; Refill: 0  Diabetes mellitus, new onset (HCC) -  Ondansetron ; Take 1 tablet (4 mg total) by mouth every 8 (eight) hours as needed for nausea or vomiting.  Dispense: 60 tablet; Refill: 2 -     Ozempic  (0.25 or 0.5 MG/DOSE); Inject 0.25 mg into the skin once a week.  Dispense: 3 mL; Refill: 5 -     Promethazine  HCl; Place 1 suppository (25 mg total) rectally every 6 (six) hours as needed for nausea or vomiting.  Dispense: 12 each; Refill: 0  Chronic bilateral low back pain with bilateral sciatica -      oxyCODONE  HCl; Take 1 tablet (10 mg total) by mouth every 8 (eight) hours as needed.  Dispense: 90 tablet; Refill: 0  Neck pain -     oxyCODONE  HCl; Take 1 tablet (10 mg total) by mouth every 8 (eight) hours as needed.  Dispense: 90 tablet; Refill: 0  High risk medication use -     oxyCODONE  HCl; Take 1 tablet (10 mg total) by mouth every 8 (eight) hours as needed.  Dispense: 90 tablet; Refill: 0  Opioid-induced constipation -     Bisacodyl  EC; Take 1 tablet (5 mg total) by mouth daily as needed for moderate constipation.  Dispense: 30 tablet; Refill: 0  Chronic kidney disease, stage 4 (severe) (HCC)

## 2023-10-25 NOTE — Progress Notes (Signed)
 ====================================  [facility]: [phone]   --  Virtual Video Medical Office Visit --  Patient: Michael Bender      Age: 78 y.o.       Sex:  male  Date:   10/25/2023 Today's Healthcare Provider: Bernardino KANDICE Cone, MD  ====================================    Chief Complaint/Reason For Visit: Medication Refill Nausea and vomiting and early loss of Ozempic .   Chart reviewed: has GERD; CONSTIPATION, CHRONIC; Primary hypertension; Bilateral leg pain; Esophageal dysphagia; Insomnia; Peripheral arterial disease; History of CVA (cerebrovascular accident); Anemia; HLD (hyperlipidemia); COPD (chronic obstructive pulmonary disease) (HCC); Iron deficiency anemia; Duodenal stenosis; Opioid dependence (HCC); Atherosclerosis of aorta; High risk medication use; Gynecomastia, male; Chronic bilateral low back pain with bilateral sciatica; Neck pain; Memory loss; Frequent falls; Nonintractable headache; Balance problems; Chronic heart failure with preserved ejection fraction (HFpEF, >= 50%) (HCC); Orthostatic hypotension; Stage 3 chronic kidney disease (HCC); Elevated BUN; Aortic valve insufficiency due to infection; Postlaminectomy syndrome, not elsewhere classified; Fecal incontinence alternating with constipation; Sleep disturbance; Diabetes mellitus, new onset (HCC); Chronic kidney disease, stage 4 (severe) (HCC); Restless legs; and Hyperkalemia on their problem list. Chart reviewed:  has a past medical history of Acute combined systolic and diastolic heart failure (HCC) (96/77/7974), Acute on chronic diastolic CHF (congestive heart failure) (HCC) (04/20/2023), AKI (acute kidney injury) (10/28/2022), Arthritis, Balance problems (02/21/2023), Blood transfusion, Cachexia (05/03/2022), Cervical discitis (11/10/2022), Chronic back pain, COPD (chronic obstructive pulmonary disease) (HCC), Disturbance of skin sensation (05/22/2018), Effusion of right knee  (02/21/2023), Effusion of right knee joint (01/16/2023), Elevated troponin (04/22/2023), Essential hypertension (03/05/2007), Finger pain, left (11/08/2022), Gastric erosion, Gastritis and gastroduodenitis, Gastrointestinal hemorrhage with melena (11/20/2018), GIB (gastrointestinal bleeding) (07/18/2019), Hardware complicating wound infection (11/10/2022), Headache(784.0), Hemorrhoids, internal, High risk medication use (03/03/2022), History of duodenal ulcer, History of fusion of cervical spine (03/03/2022), History of lumbar fusion (03/03/2022), History of upper gastrointestinal bleeding (03/03/2022), HLD (hyperlipidemia), Hypertension, Hypokalemia (04/22/2023), Hypomagnesemia (04/22/2023), Infected blister of left index finger (11/07/2022), Intractable pain (03/03/2022), Loss of weight, MSSA bacteremia (11/07/2022), Neck rigidity, Nocturia, PAIN, CHRONIC NEC (10/06/2006), Pneumonia, Prostate disease, Right sided temporal headache (05/22/2018), S/P cervical spinal fusion (03/03/2022), Senile ecchymosis (01/21/2019), Septic arthritis of wrist, left (HCC) (11/08/2022), Septic infrapatellar bursitis of right knee (11/07/2022), Staphylococcal arthritis of left wrist (HCC) (11/07/2022), Staphylococcal arthritis of right knee (HCC) (11/08/2022), Syncope (11/05/2022), and Underweight on examination (05/03/2022). Discussed the use of AI scribe software for clinical note transcription with the patient, who gave verbal consent to proceed.  History of Present Illness  78 year old male with type 2 diabetes who presents with issues related to Ozempic  use and gastrointestinal symptoms.  He is experiencing issues with his Ozempic  pen, which was found empty after only two doses, raising concerns about possible incorrect dosing. He felt very sick and vomited after his last dose, which was administered on Saturday. The vomiting episode occurred on Monday, and he has not experienced further vomiting since then.  He reports a  lack of appetite, which he attributes to the medication. He is trying to maintain hydration, especially given his stage 4 kidney disease. He is currently using oxycodone  for pain management and has been experiencing constipation, which he attributes to the combination of oxycodone  and Ozempic . He has not had a bowel movement for five days and has been using Miralax  daily to alleviate this issue. No further vomiting since Monday.   Medications reviewed: Current Outpatient Medications on File Prior to Visit  Medication Sig  amitriptyline  (ELAVIL ) 50 MG tablet Take 1 tablet (50 mg total) by mouth at bedtime.   amLODipine  (NORVASC ) 5 MG tablet Take 1 tablet (5 mg total) by mouth daily.   bisoprolol  (ZEBETA ) 5 MG tablet Take 1 tablet (5 mg total) by mouth daily.   Blood Glucose Monitoring Suppl DEVI 1 each by Does not apply route in the morning, at noon, and at bedtime. May substitute to any manufacturer covered by patient's insurance.   cefadroxil  (DURICEF) 500 MG capsule Take 500 mg by mouth 2 (two) times daily.   Continuous Glucose Sensor (FREESTYLE LIBRE 3 PLUS SENSOR) MISC Change sensor every 15 days.   cyclobenzaprine  (FLEXERIL ) 10 MG tablet Take 1 tablet (10 mg total) by mouth 3 (three) times daily as needed.   FeFum-FePoly-FA-B Cmp-C-Biot (INTEGRA PLUS ) CAPS Take 1 capsule by mouth daily.   Glucose Blood (BLOOD GLUCOSE TEST STRIPS) STRP 1 each by In Vitro route in the morning, at noon, and at bedtime. May substitute to any manufacturer covered by patient's insurance.   hydrALAZINE  (APRESOLINE ) 50 MG tablet Take 1 tablet (50 mg total) by mouth every 8 (eight) hours.   ipratropium-albuterol  (DUONEB) 0.5-2.5 (3) MG/3ML SOLN Take 3 mLs by nebulization every 4 (four) hours as needed.   isosorbide  mononitrate (IMDUR ) 60 MG 24 hr tablet Take 1 tablet (60 mg total) by mouth daily.   Lancet Device MISC 1 each by Does not apply route in the morning, at noon, and at bedtime. May substitute to any  manufacturer covered by patient's insurance.   Lancets Misc. MISC 1 each by Does not apply route in the morning, at noon, and at bedtime. May substitute to any manufacturer covered by patient's insurance.   Melatonin 1 MG CAPS Take 1 capsule (1 mg total) by mouth at bedtime.   pantoprazole  (PROTONIX ) 40 MG tablet Take 1 tablet (40 mg total) by mouth daily.   polyethylene glycol (MIRALAX  / GLYCOLAX ) 17 g packet Take 17 g by mouth daily as needed for moderate constipation.   Respiratory Therapy Supplies (NEBULIZER/TUBING/MOUTHPIECE) KIT 1 each by Does not apply route every 4 (four) hours as needed.   Respiratory Therapy Supplies (NEBULIZER/TUBING/MOUTHPIECE) KIT 1 each by Does not apply route every 4 (four) hours as needed.   rOPINIRole  (REQUIP ) 0.25 MG tablet Take 1 tablet (0.25 mg total) by mouth 3 (three) times daily.   rosuvastatin  (CRESTOR ) 40 MG tablet TAKE 1 TABLET BY MOUTH DAILY. REPLACES ATORVASTATIN  (STOP ATORVASTATIN  IF STILL TAKING)   sacubitril -valsartan  (ENTRESTO ) 24-26 MG Take 1 tablet by mouth 2 (two) times daily.   sodium zirconium cyclosilicate  (LOKELMA ) 10 g PACK packet Take 10 g by mouth daily.   SUMAtriptan  (IMITREX ) 50 MG tablet TAKE 1 TABLET (50 MG TOTAL) BY MOUTH DAILY. MAY REPEAT IN 2 HOURS IF HEADACHE PERSISTS OR RECURS.   No current facility-administered medications on file prior to visit.   Medications Discontinued During This Encounter  Medication Reason   Oxycodone  HCl 10 MG TABS Reorder   Semaglutide ,0.25 or 0.5MG /DOS, (OZEMPIC , 0.25 OR 0.5 MG/DOSE,) 2 MG/3ML SOPN Reorder       Virtual Physical Exam: Exam Context: Evaluation limited by virtual format; however, patient is clearly visualized, cooperative, and engaged throughout. General Appearance: Well-developed, well-nourished; no acute distress by limited video assessment. Pulmonary: No respiratory distress apparent; normal work of breathing observed. Neurological: Patient is awake, alert, and demonstrates no  obvious focal neurological deficits or cognitive impairments; sensorium appears unclouded. Psychiatric/Mental Status: Mood is appropriate; demeanor is pleasant, calm, and articulate.  Speech is coherent   Office Visit on 10/12/2023  Component Date Value   Hgb A1c MFr Bld 10/12/2023 8.4 (H)    Islet Cell Ab 10/12/2023 Negative    Glutamic Acid Decarb Ab 10/12/2023 <5    IA-2 Autoantibodies 10/12/2023 <7.5    ZNT8 Antibodies 10/12/2023 <10   Office Visit on 10/10/2023  Component Date Value   WBC 10/10/2023 7.2    RBC 10/10/2023 3.16 (L)    Hemoglobin 10/10/2023 9.7 (L)    Hematocrit 10/10/2023 30.6 (L)    MCV 10/10/2023 97    MCH 10/10/2023 30.7    MCHC 10/10/2023 31.7    RDW 10/10/2023 13.1    Platelets 10/10/2023 144 (L)    Glucose 10/10/2023 465 (H)    BUN 10/10/2023 38 (H)    Creatinine, Ser 10/10/2023 2.38 (H)    eGFR 10/10/2023 27 (L)    BUN/Creatinine Ratio 10/10/2023 16    Sodium 10/10/2023 132 (L)    Potassium 10/10/2023 5.4 (H)    Chloride 10/10/2023 100    CO2 10/10/2023 18 (L)    Calcium  10/10/2023 8.3 (L)   Office Visit on 09/25/2023  Component Date Value   Amphetamines 09/25/2023 NEGATIVE    Barbiturates 09/25/2023 NEGATIVE    Benzodiazepines 09/25/2023 NEGATIVE    Cocaine Metabolite 09/25/2023 NEGATIVE    Marijuana Metabolite 09/25/2023 POSITIVE (A)    Methadone Metabolite 09/25/2023 POSITIVE (A)    Opiates 09/25/2023 POSITIVE (A)    Oxycodone  09/25/2023 POSITIVE (A)    Creatinine 09/25/2023 84.9    pH 09/25/2023 6.7    Oxidant 09/25/2023 NEGATIVE    Notes and Comments 09/25/2023    Admission on 09/14/2023, Discharged on 09/14/2023  Component Date Value   Sodium 09/14/2023 139    Potassium 09/14/2023 4.2    Chloride 09/14/2023 105    CO2 09/14/2023 22    Glucose, Bld 09/14/2023 148 (H)    BUN 09/14/2023 27 (H)    Creatinine, Ser 09/14/2023 1.88 (H)    Calcium  09/14/2023 8.8 (L)    Total Protein 09/14/2023 6.6    Albumin 09/14/2023 3.7    AST  09/14/2023 31    ALT 09/14/2023 10    Alkaline Phosphatase 09/14/2023 94    Total Bilirubin 09/14/2023 0.4    GFR, Estimated 09/14/2023 36 (L)    Anion gap 09/14/2023 12    WBC 09/14/2023 5.9    RBC 09/14/2023 3.24 (L)    Hemoglobin 09/14/2023 9.9 (L)    HCT 09/14/2023 30.0 (L)    MCV 09/14/2023 92.6    MCH 09/14/2023 30.6    MCHC 09/14/2023 33.0    RDW 09/14/2023 14.0    Platelets 09/14/2023 115 (L)    nRBC 09/14/2023 0.0    Glucose-Capillary 09/14/2023 146 (H)   Results Follow-Up on 08/30/2023  Component Date Value   Glucose 08/30/2023 140 (H)    BUN 08/30/2023 35 (H)    Creatinine, Ser 08/30/2023 2.21 (H)    eGFR 08/30/2023 30 (L)    BUN/Creatinine Ratio 08/30/2023 16    Sodium 08/30/2023 137    Potassium 08/30/2023 5.4 (H)    Chloride 08/30/2023 103    CO2 08/30/2023 20    Calcium  08/30/2023 8.2 (L)   Office Visit on 08/29/2023  Component Date Value   Glucose 08/29/2023 106 (H)    BUN 08/29/2023 32 (H)    Creatinine, Ser 08/29/2023 2.10 (H)    eGFR 08/29/2023 32 (L)    BUN/Creatinine Ratio 08/29/2023 15    Sodium 08/29/2023 135  Potassium 08/29/2023 5.4 (H)    Chloride 08/29/2023 101    CO2 08/29/2023 21    Calcium  08/29/2023 8.4 (L)    WBC 08/29/2023 6.6    RBC 08/29/2023 3.28 (L)    Hemoglobin 08/29/2023 10.3 (L)    Hematocrit 08/29/2023 31.0 (L)    MCV 08/29/2023 95    MCH 08/29/2023 31.4    MCHC 08/29/2023 33.2    RDW 08/29/2023 13.9    Platelets 08/29/2023 164   Office Visit on 08/14/2023  Component Date Value   WBC 08/14/2023 8.1    RBC 08/14/2023 4.96    Hemoglobin 08/14/2023 14.8    Hematocrit 08/14/2023 46.5    MCV 08/14/2023 94    MCH 08/14/2023 29.8    MCHC 08/14/2023 31.8    RDW 08/14/2023 14.1    Platelets 08/14/2023 160    Neutrophils 08/14/2023 50    Lymphs 08/14/2023 32    Monocytes 08/14/2023 8    Eos 08/14/2023 9    Basos 08/14/2023 1    Neutrophils Absolute 08/14/2023 4.1    Lymphocytes Absolute 08/14/2023 2.6    Monocytes  Absolute 08/14/2023 0.7    EOS (ABSOLUTE) 08/14/2023 0.7 (H)    Basophils Absolute 08/14/2023 0.1    Immature Granulocytes 08/14/2023 0    Immature Grans (Abs) 08/14/2023 0.0    Glucose 08/14/2023 94    BUN 08/14/2023 34 (H)    Creatinine, Ser 08/14/2023 2.18 (H)    eGFR 08/14/2023 30 (L)    BUN/Creatinine Ratio 08/14/2023 16    Sodium 08/14/2023 136    Potassium 08/14/2023 5.5 (H)    Chloride 08/14/2023 106    CO2 08/14/2023 18 (L)    Calcium  08/14/2023 8.0 (L)    Total Protein 08/14/2023 6.3    Albumin 08/14/2023 3.6 (L)    Globulin, Total 08/14/2023 2.7    Bilirubin Total 08/14/2023 <0.2    Alkaline Phosphatase 08/14/2023 100    AST 08/14/2023 40    ALT 08/14/2023 20    Retic Ct Pct 08/14/2023 1.1    Total Iron Binding Capac* 08/14/2023 203 (L)    UIBC 08/14/2023 151    Iron 08/14/2023 52    Iron Saturation 08/14/2023 26    Ferritin 08/14/2023 216    Vitamin B-12 08/14/2023 348    Folate 08/14/2023 9.7    Albumin ELP 08/14/2023 3.5    Alpha 1 08/14/2023 0.2    Alpha 2 08/14/2023 0.6    Beta 08/14/2023 0.8    Gamma Globulin 08/14/2023 1.2    M-Spike, % 08/14/2023 Not Observed    Globulin, Total 08/14/2023 2.8    A/G Ratio 08/14/2023 1.3    Please Note: 08/14/2023 Comment    Interpretation: 08/14/2023 Comment    Haptoglobin 08/14/2023 172    Coombs', Direct 08/14/2023 Negative    Erythropoietin  08/14/2023 11.8    PTH 08/14/2023 28    Phosphorus 08/14/2023 4.9 (H)    Cholesterol 08/14/2023 93    Triglycerides 08/14/2023 58.0    HDL 08/14/2023 46.00    VLDL 08/14/2023 11.6    LDL Cholesterol 08/14/2023 36    Total CHOL/HDL Ratio 08/14/2023 2    NonHDL 08/14/2023 47.33   Admission on 07/24/2023, Discharged on 07/24/2023  Component Date Value   Est EF 07/24/2023 50 - 55%    Specimen Description 07/24/2023 BLOOD LEFT ARM    Special Requests 07/24/2023 BOTTLES DRAWN AEROBIC AND ANAEROBIC Blood Culture adequate volume    Culture 07/24/2023  Value:NO GROWTH 5 DAYS Performed at [facility], [address] [place], [place] [address]    Report Status 07/24/2023 07/29/2023 FINAL    Specimen Description 07/24/2023 BLOOD RIGHT ARM    Special Requests 07/24/2023 BOTTLES DRAWN AEROBIC AND ANAEROBIC Blood Culture adequate volume    Culture 07/24/2023                     Value:NO GROWTH 5 DAYS Performed at [facility], [address] [place], [place] [address]    Report Status 07/24/2023 07/29/2023 FINAL   Lab on 07/21/2023  Component Date Value   WBC 07/21/2023 4.6    RBC 07/21/2023 3.25 (L)    Hemoglobin 07/21/2023 9.7 (L)    HCT 07/21/2023 28.8 (L)    MCV 07/21/2023 88.8    MCHC 07/21/2023 33.7    RDW 07/21/2023 14.6    Platelets 07/21/2023 129.0 (L)    Neutrophils Relative % 07/21/2023 58.9    Lymphocytes Relative 07/21/2023 23.3    Monocytes Relative 07/21/2023 8.4    Eosinophils Relative 07/21/2023 9.0 (H)    Basophils Relative 07/21/2023 0.4    Neutro Abs 07/21/2023 2.7    Lymphs Abs 07/21/2023 1.1    Monocytes Absolute 07/21/2023 0.4    Eosinophils Absolute 07/21/2023 0.4    Basophils Absolute 07/21/2023 0.0   Office Visit on 07/20/2023  Component Date Value   Glucose 07/20/2023 119 (H)    BUN 07/20/2023 30 (H)    Creatinine, Ser 07/20/2023 1.85 (H)    eGFR 07/20/2023 37 (L)    BUN/Creatinine Ratio 07/20/2023 16    Sodium 07/20/2023 135    Potassium 07/20/2023 5.2    Chloride 07/20/2023 100    CO2 07/20/2023 21    Calcium  07/20/2023 8.4 (L)   There may be more visits with results that are not included.  No image results found. DG Thoracic Spine W/Swimmers Result Date: 10/03/2023 CLINICAL DATA:  Back pain without known injury. EXAM: THORACIC SPINE - 3 VIEWS COMPARISON:  None Available. FINDINGS: There is no evidence of thoracic spine fracture. Alignment is normal. No other significant bone abnormalities are identified. IMPRESSION: Negative. Electronically Signed   By: Lynwood Landy Raddle M.D.   On:  10/03/2023 08:27   DG Cervical Spine Complete Result Date: 10/03/2023 CLINICAL DATA:  Cervical pain without known injury. EXAM: CERVICAL SPINE - COMPLETE 4+ VIEW COMPARISON:  February 17, 2023. FINDINGS: Status post surgical posterior fusion of C2-3. Minimal grade 1 anterolisthesis of C4-5 is noted secondary to posterior facet joint hypertrophy. Moderate degenerative disc disease noted at C5-6 with anterior osteophyte formation. No fracture or prevertebral soft tissue swelling is noted. Mild bilateral neural foraminal stenosis is noted at C4-5 and C5-6 secondary to uncovertebral spurring. IMPRESSION: Postsurgical and degenerative changes as described above. No acute abnormality seen. Electronically Signed   By: Lynwood Landy Raddle M.D.   On: 10/03/2023 08:26  No results found.      ASSESSMENT & PLAN   Assessment & Plan Diabetes mellitus, new onset (HCC) Type 2 diabetes mellitus, on Ozempic    Type 2 diabetes is managed with Ozempic , with possible incorrect dosing leading to side effects. Plan to restart at 0.25 mg once the pharmacy allows. Send a prescription for Ozempic  to be started at 0.25 mg when available. Monitor for side effects and adjust dosing as needed. Nausea Nausea and vomiting due to medication side effect   Nausea and vomiting are likely due to incorrect dosing of Ozempic , with  vomiting occurring once on Monday after administration on Saturday. There is a risk of dehydration and electrolyte imbalance, with potential for hospitalization if vomiting persists. Prescribe Zofran  dissolving tablets for nausea and Phenergan  suppositories for severe vomiting. Advise going to the ER if vomiting persists beyond 2-3 episodes. Encourage prehydration with Gatorade or Pedialyte. Chronic bilateral low back pain with bilateral sciatica High risk medication use Neck pain Chronic pain requiring opioid therapy   Chronic pain is managed with oxycodone , with potential for opioid-induced constipation  contributing to nausea and vomiting. Send a fresh prescription for [facility]codone  to [facility] [place]. Advise monitoring for constipation and adjusting the bowel regimen as needed.  PDMP reviewed during this encounter.  Opioid-induced constipation Constipation due to opioid and medication use   Constipation is likely due to a combination of opioid use and Ozempic , with no bowel movement in five days. This could exacerbate nausea and vomiting if not addressed. Prescribe bisacodyl  (Dulcolax) to be used with Miralax . Advise increasing fluid intake. Monitor bowel movements and adjust treatment as needed.  Appetite loss due to medication and/or constipation   Loss of appetite is likely due to medication side effects and constipation, with a risk of nutritional deficiency if prolonged. Encourage fluid intake and small frequent meals. Monitor appetite and adjust treatment as needed. Chronic kidney disease, stage 4 (severe) (HCC) Chronic kidney disease stage 4   Chronic kidney disease stage 4 presents a risk of kidney damage if dehydration occurs due to vomiting. Ozempic  may protect kidneys if not vomiting. Schedule an in-office appointment for labs to monitor kidney function.  ORDER ASSOCIATIONS  #   DIAGNOSIS / CONDITION ICD-10 ENCOUNTER ORDER     ICD-10-CM   1. Nausea  R11.0 ondansetron  (ZOFRAN -ODT) 4 MG disintegrating tablet    Semaglutide ,0.25 or 0.5MG /DOS, (OZEMPIC , 0.25 OR 0.5 MG/DOSE,) 2 MG/3ML SOPN    promethazine  (PHENERGAN ) 25 MG suppository    2. Diabetes mellitus, new onset (HCC)  E11.9 ondansetron  (ZOFRAN -ODT) 4 MG disintegrating tablet    Semaglutide ,0.25 or 0.5MG /DOS, (OZEMPIC , 0.25 OR 0.5 MG/DOSE,) 2 MG/3ML SOPN    promethazine  (PHENERGAN ) 25 MG suppository    3. Chronic bilateral low back pain with bilateral sciatica  M54.42 Oxycodone  HCl 10 MG TABS   M54.41    G89.29     4. Neck pain  M54.2 Oxycodone  HCl 10 MG TABS    5. High risk medication use  Z79.899 Oxycodone  HCl 10 MG  TABS    6. Opioid-induced constipation  K59.03 bisacodyl  5 MG EC tablet   T40.2X5A           Orders Placed in Encounter:   Meds ordered this encounter  Medications   ondansetron  (ZOFRAN -ODT) 4 MG disintegrating tablet    Sig: Take 1 tablet (4 mg total) by mouth every 8 (eight) hours as needed for nausea or vomiting.    Dispense:  60 tablet    Refill:  2   Semaglutide ,0.25 or 0.5MG /DOS, (OZEMPIC , 0.25 OR 0.5 MG/DOSE,) 2 MG/3ML SOPN    Sig: Inject 0.25 mg into the skin once a week.    Dispense:  3 mL    Refill:  5   promethazine  (PHENERGAN ) 25 MG suppository    Sig: Place 1 suppository (25 mg total) rectally every 6 (six) hours as needed for nausea or vomiting.    Dispense:  12 each    Refill:  0   Oxycodone  HCl 10 MG TABS    Sig: Take 1 tablet (10 mg total) by mouth every 8 (  eight) hours as needed.    Dispense:  90 tablet    Refill:  0   bisacodyl  5 MG EC tablet    Sig: Take 1 tablet (5 mg total) by mouth daily as needed for moderate constipation.    Dispense:  30 tablet    Refill:  0   \     Treatment plan discussed and reviewed in detail. Explained medication safety and potential side effects.  Answered all patient questions and confirmed understanding and comfort with the plan. Encouraged patient to contact our office if they have any questions or concerns.  Agreed on patient coming for a sooner office visit if symptoms worsen, persist, or new symptoms develop. Discussed precautions in case of needing to visit the Emergency Department.    ----------------------------------------------------- Attestation:  Today's Healthcare Provider Bernardino KANDICE Cone, MD was located at office at [facility] at [facility] [address], [place] [address].  The patient was located at home. All video encounter participant identities and locations confirmed visually and verbally.Today's Telemedicine visit was conducted via synchronous Video after consent for telemedicine was  obtained:  Video connection was never lost   This document was transcribed and resynthesized, in part, by artificial intelligence ([employer])  using HIPAA-compliant recording of the clinical interaction;   We have discussed the our use of AI scribe software for clinical note transcription with the patient, who has given verbal consent to proceed.

## 2023-10-25 NOTE — Assessment & Plan Note (Signed)
 Type 2 diabetes mellitus, on Ozempic    Type 2 diabetes is managed with Ozempic , with possible incorrect dosing leading to side effects. Plan to restart at 0.25 mg once the pharmacy allows. Send a prescription for Ozempic  to be started at 0.25 mg when available. Monitor for side effects and adjust dosing as needed.

## 2023-10-25 NOTE — Assessment & Plan Note (Signed)
 Chronic kidney disease stage 4   Chronic kidney disease stage 4 presents a risk of kidney damage if dehydration occurs due to vomiting. Ozempic  may protect kidneys if not vomiting. Schedule an in-office appointment for labs to monitor kidney function.

## 2023-11-01 ENCOUNTER — Other Ambulatory Visit: Payer: Self-pay

## 2023-11-01 ENCOUNTER — Encounter (HOSPITAL_BASED_OUTPATIENT_CLINIC_OR_DEPARTMENT_OTHER): Payer: Self-pay | Admitting: Emergency Medicine

## 2023-11-01 ENCOUNTER — Inpatient Hospital Stay (HOSPITAL_BASED_OUTPATIENT_CLINIC_OR_DEPARTMENT_OTHER)
Admission: EM | Admit: 2023-11-01 | Discharge: 2023-11-15 | DRG: 216 | Disposition: A | Discharge status: Rehabilitation Facility

## 2023-11-01 ENCOUNTER — Emergency Department (HOSPITAL_BASED_OUTPATIENT_CLINIC_OR_DEPARTMENT_OTHER): Admitting: Radiology

## 2023-11-01 DIAGNOSIS — E1151 Type 2 diabetes mellitus with diabetic peripheral angiopathy without gangrene: Secondary | ICD-10-CM | POA: Diagnosis present

## 2023-11-01 DIAGNOSIS — Z800 Family history of malignant neoplasm of digestive organs: Secondary | ICD-10-CM

## 2023-11-01 DIAGNOSIS — E1122 Type 2 diabetes mellitus with diabetic chronic kidney disease: Secondary | ICD-10-CM | POA: Diagnosis present

## 2023-11-01 DIAGNOSIS — I352 Nonrheumatic aortic (valve) stenosis with insufficiency: Secondary | ICD-10-CM | POA: Diagnosis not present

## 2023-11-01 DIAGNOSIS — R0609 Other forms of dyspnea: Secondary | ICD-10-CM | POA: Diagnosis not present

## 2023-11-01 DIAGNOSIS — Z8619 Personal history of other infectious and parasitic diseases: Secondary | ICD-10-CM

## 2023-11-01 DIAGNOSIS — R29810 Facial weakness: Secondary | ICD-10-CM | POA: Diagnosis not present

## 2023-11-01 DIAGNOSIS — E1165 Type 2 diabetes mellitus with hyperglycemia: Secondary | ICD-10-CM | POA: Diagnosis present

## 2023-11-01 DIAGNOSIS — I4891 Unspecified atrial fibrillation: Secondary | ICD-10-CM | POA: Diagnosis not present

## 2023-11-01 DIAGNOSIS — I130 Hypertensive heart and chronic kidney disease with heart failure and stage 1 through stage 4 chronic kidney disease, or unspecified chronic kidney disease: Secondary | ICD-10-CM | POA: Diagnosis present

## 2023-11-01 DIAGNOSIS — I440 Atrioventricular block, first degree: Secondary | ICD-10-CM | POA: Diagnosis not present

## 2023-11-01 DIAGNOSIS — D6959 Other secondary thrombocytopenia: Secondary | ICD-10-CM | POA: Diagnosis not present

## 2023-11-01 DIAGNOSIS — Z7984 Long term (current) use of oral hypoglycemic drugs: Secondary | ICD-10-CM

## 2023-11-01 DIAGNOSIS — E43 Unspecified severe protein-calorie malnutrition: Secondary | ICD-10-CM | POA: Diagnosis present

## 2023-11-01 DIAGNOSIS — N189 Chronic kidney disease, unspecified: Secondary | ICD-10-CM | POA: Diagnosis not present

## 2023-11-01 DIAGNOSIS — Z8249 Family history of ischemic heart disease and other diseases of the circulatory system: Secondary | ICD-10-CM

## 2023-11-01 DIAGNOSIS — E785 Hyperlipidemia, unspecified: Secondary | ICD-10-CM | POA: Diagnosis not present

## 2023-11-01 DIAGNOSIS — I129 Hypertensive chronic kidney disease with stage 1 through stage 4 chronic kidney disease, or unspecified chronic kidney disease: Secondary | ICD-10-CM | POA: Diagnosis not present

## 2023-11-01 DIAGNOSIS — T4275XA Adverse effect of unspecified antiepileptic and sedative-hypnotic drugs, initial encounter: Secondary | ICD-10-CM | POA: Diagnosis not present

## 2023-11-01 DIAGNOSIS — R918 Other nonspecific abnormal finding of lung field: Secondary | ICD-10-CM | POA: Diagnosis not present

## 2023-11-01 DIAGNOSIS — Z794 Long term (current) use of insulin: Secondary | ICD-10-CM

## 2023-11-01 DIAGNOSIS — R54 Age-related physical debility: Secondary | ICD-10-CM | POA: Diagnosis present

## 2023-11-01 DIAGNOSIS — I77810 Thoracic aortic ectasia: Secondary | ICD-10-CM | POA: Diagnosis present

## 2023-11-01 DIAGNOSIS — E872 Acidosis, unspecified: Secondary | ICD-10-CM | POA: Diagnosis present

## 2023-11-01 DIAGNOSIS — Z885 Allergy status to narcotic agent status: Secondary | ICD-10-CM

## 2023-11-01 DIAGNOSIS — Z8711 Personal history of peptic ulcer disease: Secondary | ICD-10-CM

## 2023-11-01 DIAGNOSIS — G8918 Other acute postprocedural pain: Secondary | ICD-10-CM | POA: Diagnosis not present

## 2023-11-01 DIAGNOSIS — Z1152 Encounter for screening for COVID-19: Secondary | ICD-10-CM

## 2023-11-01 DIAGNOSIS — N1832 Chronic kidney disease, stage 3b: Secondary | ICD-10-CM | POA: Diagnosis present

## 2023-11-01 DIAGNOSIS — G928 Other toxic encephalopathy: Secondary | ICD-10-CM | POA: Diagnosis not present

## 2023-11-01 DIAGNOSIS — E119 Type 2 diabetes mellitus without complications: Secondary | ICD-10-CM

## 2023-11-01 DIAGNOSIS — Z604 Social exclusion and rejection: Secondary | ICD-10-CM | POA: Diagnosis present

## 2023-11-01 DIAGNOSIS — Z9181 History of falling: Secondary | ICD-10-CM

## 2023-11-01 DIAGNOSIS — R64 Cachexia: Secondary | ICD-10-CM | POA: Diagnosis present

## 2023-11-01 DIAGNOSIS — I10 Essential (primary) hypertension: Secondary | ICD-10-CM | POA: Diagnosis not present

## 2023-11-01 DIAGNOSIS — Z981 Arthrodesis status: Secondary | ICD-10-CM

## 2023-11-01 DIAGNOSIS — N400 Enlarged prostate without lower urinary tract symptoms: Secondary | ICD-10-CM | POA: Diagnosis present

## 2023-11-01 DIAGNOSIS — K5900 Constipation, unspecified: Secondary | ICD-10-CM | POA: Diagnosis not present

## 2023-11-01 DIAGNOSIS — R569 Unspecified convulsions: Secondary | ICD-10-CM | POA: Diagnosis not present

## 2023-11-01 DIAGNOSIS — I2489 Other forms of acute ischemic heart disease: Secondary | ICD-10-CM | POA: Diagnosis present

## 2023-11-01 DIAGNOSIS — R0602 Shortness of breath: Secondary | ICD-10-CM | POA: Diagnosis not present

## 2023-11-01 DIAGNOSIS — I351 Nonrheumatic aortic (valve) insufficiency: Secondary | ICD-10-CM | POA: Diagnosis present

## 2023-11-01 DIAGNOSIS — I480 Paroxysmal atrial fibrillation: Secondary | ICD-10-CM | POA: Diagnosis present

## 2023-11-01 DIAGNOSIS — G479 Sleep disorder, unspecified: Secondary | ICD-10-CM

## 2023-11-01 DIAGNOSIS — I6522 Occlusion and stenosis of left carotid artery: Secondary | ICD-10-CM | POA: Diagnosis present

## 2023-11-01 DIAGNOSIS — D509 Iron deficiency anemia, unspecified: Secondary | ICD-10-CM | POA: Diagnosis present

## 2023-11-01 DIAGNOSIS — G2581 Restless legs syndrome: Secondary | ICD-10-CM | POA: Diagnosis present

## 2023-11-01 DIAGNOSIS — K219 Gastro-esophageal reflux disease without esophagitis: Secondary | ICD-10-CM | POA: Diagnosis present

## 2023-11-01 DIAGNOSIS — Z953 Presence of xenogenic heart valve: Secondary | ICD-10-CM

## 2023-11-01 DIAGNOSIS — G43909 Migraine, unspecified, not intractable, without status migrainosus: Secondary | ICD-10-CM | POA: Diagnosis present

## 2023-11-01 DIAGNOSIS — Z79899 Other long term (current) drug therapy: Secondary | ICD-10-CM

## 2023-11-01 DIAGNOSIS — Y840 Cardiac catheterization as the cause of abnormal reaction of the patient, or of later complication, without mention of misadventure at the time of the procedure: Secondary | ICD-10-CM | POA: Diagnosis not present

## 2023-11-01 DIAGNOSIS — Z951 Presence of aortocoronary bypass graft: Secondary | ICD-10-CM

## 2023-11-01 DIAGNOSIS — N179 Acute kidney failure, unspecified: Principal | ICD-10-CM | POA: Diagnosis present

## 2023-11-01 DIAGNOSIS — M79601 Pain in right arm: Secondary | ICD-10-CM | POA: Diagnosis not present

## 2023-11-01 DIAGNOSIS — R509 Fever, unspecified: Secondary | ICD-10-CM | POA: Diagnosis not present

## 2023-11-01 DIAGNOSIS — D62 Acute posthemorrhagic anemia: Secondary | ICD-10-CM | POA: Diagnosis not present

## 2023-11-01 DIAGNOSIS — Z602 Problems related to living alone: Secondary | ICD-10-CM | POA: Diagnosis present

## 2023-11-01 DIAGNOSIS — Z681 Body mass index (BMI) 19 or less, adult: Secondary | ICD-10-CM

## 2023-11-01 DIAGNOSIS — Z8679 Personal history of other diseases of the circulatory system: Secondary | ICD-10-CM

## 2023-11-01 DIAGNOSIS — E876 Hypokalemia: Secondary | ICD-10-CM | POA: Diagnosis not present

## 2023-11-01 DIAGNOSIS — Z7985 Long-term (current) use of injectable non-insulin antidiabetic drugs: Secondary | ICD-10-CM

## 2023-11-01 DIAGNOSIS — Z87891 Personal history of nicotine dependence: Secondary | ICD-10-CM

## 2023-11-01 DIAGNOSIS — I2510 Atherosclerotic heart disease of native coronary artery without angina pectoris: Secondary | ICD-10-CM | POA: Diagnosis present

## 2023-11-01 DIAGNOSIS — I5033 Acute on chronic diastolic (congestive) heart failure: Secondary | ICD-10-CM | POA: Diagnosis present

## 2023-11-01 DIAGNOSIS — R414 Neurologic neglect syndrome: Secondary | ICD-10-CM | POA: Diagnosis not present

## 2023-11-01 DIAGNOSIS — E871 Hypo-osmolality and hyponatremia: Secondary | ICD-10-CM | POA: Diagnosis not present

## 2023-11-01 DIAGNOSIS — Z8673 Personal history of transient ischemic attack (TIA), and cerebral infarction without residual deficits: Secondary | ICD-10-CM

## 2023-11-01 DIAGNOSIS — J439 Emphysema, unspecified: Secondary | ICD-10-CM | POA: Diagnosis present

## 2023-11-01 DIAGNOSIS — G4700 Insomnia, unspecified: Secondary | ICD-10-CM | POA: Diagnosis not present

## 2023-11-01 DIAGNOSIS — T383X6A Underdosing of insulin and oral hypoglycemic [antidiabetic] drugs, initial encounter: Secondary | ICD-10-CM | POA: Diagnosis present

## 2023-11-01 LAB — TROPONIN T, HIGH SENSITIVITY
Troponin T High Sensitivity: 67 ng/L — ABNORMAL HIGH (ref 0–19)
Troponin T High Sensitivity: 62 ng/L — ABNORMAL HIGH (ref 0–19)

## 2023-11-01 LAB — URINALYSIS, W/ REFLEX TO CULTURE (INFECTION SUSPECTED)
Bilirubin Urine: NEGATIVE
Glucose, UA: >=500 mg/dL — AB
Ketones, ur: NEGATIVE mg/dL
Leukocytes,Ua: NEGATIVE
Nitrite: NEGATIVE
Protein, ur: 30 mg/dL — AB
Specific Gravity, Urine: 1.009 (ref 1.005–1.030)
pH: 5 (ref 5.0–8.0)

## 2023-11-01 LAB — COMPREHENSIVE METABOLIC PANEL WITH GFR
ALT: 10 U/L (ref 0–44)
AST: 29 U/L (ref 15–41)
Albumin: 3.7 g/dL (ref 3.5–5.0)
Alkaline Phosphatase: 101 U/L (ref 38–126)
Anion gap: 16 — ABNORMAL HIGH (ref 5–15)
BUN: 48 mg/dL — ABNORMAL HIGH (ref 8–23)
CO2: 18 mmol/L — ABNORMAL LOW (ref 22–32)
Calcium: 9.2 mg/dL (ref 8.9–10.3)
Chloride: 99 mmol/L (ref 98–111)
Creatinine, Ser: 3 mg/dL — ABNORMAL HIGH (ref 0.61–1.24)
GFR, Estimated: 21 mL/min — ABNORMAL LOW (ref >60)
Glucose, Bld: 126 mg/dL — ABNORMAL HIGH (ref 70–99)
Potassium: 4.7 mmol/L (ref 3.5–5.1)
Sodium: 132 mmol/L — ABNORMAL LOW (ref 135–145)
Total Bilirubin: 0.5 mg/dL (ref 0.0–1.2)
Total Protein: 6.9 g/dL (ref 6.5–8.1)

## 2023-11-01 LAB — RESP PANEL BY RT-PCR (RSV, FLU A&B, COVID)  RVPGX2
Influenza A by PCR: NEGATIVE
Influenza B by PCR: NEGATIVE
Resp Syncytial Virus by PCR: NEGATIVE
SARS Coronavirus 2 by RT PCR: NEGATIVE

## 2023-11-01 LAB — CBC WITH DIFFERENTIAL/PLATELET
Abs Immature Granulocytes: 0.02 K/uL (ref 0.00–0.07)
Basophils Absolute: 0 K/uL (ref 0.0–0.1)
Basophils Relative: 1 %
Eosinophils Absolute: 0.1 K/uL (ref 0.0–0.5)
Eosinophils Relative: 2 %
HCT: 38 % — ABNORMAL LOW (ref 39.0–52.0)
Hemoglobin: 13.1 g/dL (ref 13.0–17.0)
Immature Granulocytes: 0 %
Lymphocytes Relative: 21 %
Lymphs Abs: 1.8 K/uL (ref 0.7–4.0)
MCH: 31 pg (ref 26.0–34.0)
MCHC: 34.5 g/dL (ref 30.0–36.0)
MCV: 90 fL (ref 80.0–100.0)
Monocytes Absolute: 0.6 K/uL (ref 0.1–1.0)
Monocytes Relative: 7 %
Neutro Abs: 5.9 K/uL (ref 1.7–7.7)
Neutrophils Relative %: 69 %
Platelets: 146 K/uL — ABNORMAL LOW (ref 150–400)
RBC: 4.22 MIL/uL (ref 4.22–5.81)
RDW: 13.5 % (ref 11.5–15.5)
WBC: 8.4 K/uL (ref 4.0–10.5)
nRBC: 0 % (ref 0.0–0.2)

## 2023-11-01 LAB — LACTIC ACID, PLASMA
Lactic Acid, Venous: 1.1 mmol/L (ref 0.5–1.9)
Lactic Acid, Venous: 1.1 mmol/L (ref 0.5–1.9)
Lactic Acid, Venous: 1.6 mmol/L (ref 0.5–1.9)

## 2023-11-01 LAB — GLUCOSE, CAPILLARY
Glucose-Capillary: 81 mg/dL (ref 70–99)
Glucose-Capillary: 131 mg/dL — ABNORMAL HIGH (ref 70–99)

## 2023-11-01 LAB — PRO BRAIN NATRIURETIC PEPTIDE: Pro Brain Natriuretic Peptide: 634 pg/mL — ABNORMAL HIGH (ref <300.0)

## 2023-11-01 MED ORDER — LACTATED RINGERS IV BOLUS
500.0000 mL | Freq: Once | INTRAVENOUS | Status: AC
  Administered 2023-11-01: 500 mL via INTRAVENOUS

## 2023-11-01 MED ORDER — SUMATRIPTAN SUCCINATE 50 MG PO TABS
50.0000 mg | ORAL_TABLET | Freq: Every day | ORAL | Status: DC | PRN
  Administered 2023-11-07 – 2023-11-11 (×2): 50 mg via ORAL
  Filled 2023-11-01 (×4): qty 1

## 2023-11-01 MED ORDER — AMLODIPINE BESYLATE 5 MG PO TABS
5.0000 mg | ORAL_TABLET | Freq: Every day | ORAL | Status: DC
  Administered 2023-11-02 – 2023-11-07 (×5): 5 mg via ORAL
  Filled 2023-11-01 (×6): qty 1

## 2023-11-01 MED ORDER — ROSUVASTATIN CALCIUM 20 MG PO TABS
40.0000 mg | ORAL_TABLET | Freq: Every day | ORAL | Status: DC
  Administered 2023-11-01 – 2023-11-15 (×13): 40 mg via ORAL
  Filled 2023-11-01 (×13): qty 2

## 2023-11-01 MED ORDER — ROPINIROLE HCL 0.25 MG PO TABS
0.2500 mg | ORAL_TABLET | Freq: Three times a day (TID) | ORAL | Status: DC
  Administered 2023-11-01 – 2023-11-07 (×19): 0.25 mg via ORAL
  Filled 2023-11-01 (×23): qty 1

## 2023-11-01 MED ORDER — HYDRALAZINE HCL 50 MG PO TABS
50.0000 mg | ORAL_TABLET | Freq: Three times a day (TID) | ORAL | Status: DC
  Administered 2023-11-01 – 2023-11-07 (×7): 50 mg via ORAL
  Filled 2023-11-01 (×9): qty 1

## 2023-11-01 MED ORDER — PANTOPRAZOLE SODIUM 40 MG PO TBEC
40.0000 mg | DELAYED_RELEASE_TABLET | Freq: Every day | ORAL | Status: DC
  Administered 2023-11-01 – 2023-11-07 (×7): 40 mg via ORAL
  Filled 2023-11-01 (×7): qty 1

## 2023-11-01 MED ORDER — INSULIN ASPART 100 UNIT/ML IJ SOLN
0.0000 [IU] | Freq: Three times a day (TID) | INTRAMUSCULAR | Status: DC
  Administered 2023-11-02 (×2): 3 [IU] via SUBCUTANEOUS
  Administered 2023-11-03: 5 [IU] via SUBCUTANEOUS
  Administered 2023-11-03 (×2): 2 [IU] via SUBCUTANEOUS
  Administered 2023-11-04: 5 [IU] via SUBCUTANEOUS
  Administered 2023-11-04: 2 [IU] via SUBCUTANEOUS
  Administered 2023-11-04: 3 [IU] via SUBCUTANEOUS
  Administered 2023-11-05: 5 [IU] via SUBCUTANEOUS
  Administered 2023-11-05: 3 [IU] via SUBCUTANEOUS
  Administered 2023-11-05 – 2023-11-06 (×2): 2 [IU] via SUBCUTANEOUS
  Administered 2023-11-07: 3 [IU] via SUBCUTANEOUS
  Administered 2023-11-07 – 2023-11-08 (×2): 5 [IU] via SUBCUTANEOUS

## 2023-11-01 MED ORDER — BISOPROLOL FUMARATE 5 MG PO TABS
5.0000 mg | ORAL_TABLET | Freq: Every day | ORAL | Status: DC
Start: 2023-11-01 — End: 2023-11-01
  Filled 2023-11-01 (×2): qty 1

## 2023-11-01 MED ORDER — AMITRIPTYLINE HCL 50 MG PO TABS
50.0000 mg | ORAL_TABLET | Freq: Every day | ORAL | Status: AC
Start: 2023-11-01 — End: ?
  Administered 2023-11-01 – 2023-11-07 (×7): 50 mg via ORAL
  Filled 2023-11-01 (×8): qty 1

## 2023-11-01 MED ORDER — IPRATROPIUM-ALBUTEROL 0.5-2.5 (3) MG/3ML IN SOLN: 3.0000 mL | RESPIRATORY_TRACT | Status: DC | PRN

## 2023-11-01 MED ORDER — ENSURE PLUS HIGH PROTEIN PO LIQD
237.0000 mL | Freq: Two times a day (BID) | ORAL | Status: DC
Start: 2023-11-02 — End: 2023-11-15
  Administered 2023-11-02 – 2023-11-15 (×18): 237 mL via ORAL

## 2023-11-01 MED ORDER — SPIRONOLACTONE 12.5 MG HALF TABLET: 12.5000 mg | ORAL_TABLET | Freq: Every day | ORAL | Status: DC

## 2023-11-01 MED ORDER — HEPARIN SODIUM (PORCINE) 5000 UNIT/ML IJ SOLN
5000.0000 [IU] | Freq: Two times a day (BID) | INTRAMUSCULAR | Status: DC
  Administered 2023-11-01 – 2023-11-07 (×13): 5000 [IU] via SUBCUTANEOUS
  Filled 2023-11-01 (×13): qty 1

## 2023-11-01 NOTE — Plan of Care (Signed)

## 2023-11-01 NOTE — ED Triage Notes (Signed)
 C/o exertional SHOB since Saturday. Hx of CHF. Family states family has not been eating and drinking as normal over the last 3 days.

## 2023-11-01 NOTE — H&P (Signed)
 History and Physical    Patient: Michael Bender FMW:[id number] DOB: 1946/01/26 DOA: 11/01/2023 DOS: the patient was seen and examined on 11/01/2023 . PCP: [name], MD  Patient coming from: [facility] Chief complaint: Chief Complaint  Patient presents with   Shortness of Breath   HPI:  Michael Bender is a 78 y.o. male with past medical history  of  hypertension, hyperlipidemia, GI bleeding due to duodenal ulcer, peripheral vascular disease, smoking and COPD, combined systolic and diastolic heart failure, degenerative spine disease status post multiple surgeries, multiple falls with traumatic rhabdomyolysis and acute kidney failure,mild ascending aortic dilation to 4.3 cm, pt had a TEE in June 2025 showing:  IMPRESSIONS 1. Oscillating, linear density (0.7-0.8 cm) on left coronary cusp of aortic valve concerning for vegetation; severe AI (best seen on transgastric views). 2. Left ventricular ejection fraction, by estimation, is 50 to 55%. The left ventricle has low normal function. The left ventricle has no regional wall motion abnormalities. The left ventricular internal cavity size was mildly dilated. 3. Right ventricular systolic function is mildly reduced. The right ventricular size is normal. 4. Left atrial size was moderately dilated. No left atrial/left atrial appendage thrombus was detected. 5. The mitral valve is normal in structure. Mild mitral valve regurgitation. 6. The aortic valve is tricuspid. Aortic valve regurgitation is severe. 7. There is Moderate (Grade III) plaque involving the descending aorta. 8. 3D performed of the aortic valve. Pt was seen by CT Sx on  08/30/2023 with plan for  open surgical aortic valve replacement as pt is not a candidate for TAVR.     ED Course:  Vital signs in the ED were notable for the following:  Vitals:   11/01/23 1200 11/01/23 1309 11/01/23 1330 11/01/23 1535  BP: (!) 127/48  (!) 121/48 107/70  Pulse: (!) 56  (!) 54 61  Temp:  98 F (36.7  C)  97.8 F (36.6 C)  Resp: 10  15 17   SpO2: 100%  99% 100%  TempSrc:  Oral    >>ED evaluation thus far shows: - Initial CMP showed 132 sodium bicarb of 18 anion gap 16 glucose 126 BUN 48 creatinine of 3.0. Normal LFTs. BNP of 634, troponin of 67 and repeated at 62. Lactic acid of 1.1. CBC shows white count of 8.4 hemoglobin of 13.1 platelets 146.  Neck Viral panel negative for flu RSV and COVID. Blood cultures collected in the ED. Abnormal chest x-ray showing possible small nodular opacity over the right mid to lower lung with recommended chest x-ray in 3 months along with noncontrast chest CT.   >>While in the ED patient received the following: Medications  insulin  aspart (novoLOG ) injection 0-15 Units (has no administration in time range)  lactated ringers  bolus 500 mL (0 mLs Intravenous Stopped 11/01/23 1209)   Review of Systems  Respiratory:  Positive for shortness of breath.   All other systems reviewed and are negative.  Past Medical History:  Diagnosis Date   Acute combined systolic and diastolic heart failure (HCC) 04/22/2023   Acute on chronic diastolic CHF (congestive heart failure) (HCC) 04/20/2023   AKI (acute kidney injury) 10/28/2022   Arthritis    Back   Balance problems 02/21/2023   Blood transfusion    as a child   Cachexia 05/03/2022   Added based on temporal wasting noted on exam, Body mass index is 19.92 kg/m. At 05/03/2022 encounter     Cervical discitis 11/10/2022   Chronic back pain  COPD (chronic obstructive pulmonary disease) (HCC)    Disturbance of skin sensation 05/22/2018   Effusion of right knee 02/21/2023   Effusion of right knee joint 01/16/2023   Elevated troponin 04/22/2023   Essential hypertension 03/05/2007   Qualifier: Diagnosis of   By: Krystal MD, Reyes LABOR      Finger pain, left 11/08/2022   Gastric erosion    Gastritis and gastroduodenitis    Gastrointestinal hemorrhage with melena 11/20/2018   GIB (gastrointestinal bleeding)  07/18/2019   Hardware complicating wound infection 11/10/2022   Headache(784.0)    last one 6 months ago   Hemorrhoids, internal    High risk medication use 03/03/2022   History of duodenal ulcer    History of fusion of cervical spine 03/03/2022   With persistent cervical pain and palpable screws  Led to disability  History of attempt to dig furrow in skull to fix it  Last surgery 1992     History of lumbar fusion 03/03/2022   RE-OPERATIVE DIAGNOSIS:  lumbar stenosis synovial cyst lumbar spondylosis spondylolisthesis lumbar radiculoapthy L4/5   PROCEDURE:  Procedure(s): POSTERIOR LUMBAR FUSION 1 LEVEL with resection of synovial cyst     History of upper gastrointestinal bleeding 03/03/2022   Duodenal ulcer 2021 Dr. Avram   HLD (hyperlipidemia)    Hypertension    Hypokalemia 04/22/2023   Hypomagnesemia 04/22/2023   Infected blister of left index finger 11/07/2022   Intractable pain 03/03/2022   Loss of weight    Wt Readings from Last 10 Encounters:  04/05/22  146 lb (66.2 kg)  03/03/22  143 lb 9.6 oz (65.1 kg)  07/14/21  153 lb (69.4 kg)  12/14/20  147 lb 12.8 oz (67 kg)  11/13/19  154 lb (69.9 kg)  09/16/19  146 lb (66.2 kg)  08/06/19  146 lb (66.2 kg)  07/19/19  144 lb 13.5 oz (65.7 kg)  07/17/19  148 lb (67.1 kg)  07/09/19  147 lb 9.6 oz (67 kg)         MSSA bacteremia 11/07/2022   Neck rigidity    post cervical fusion   Nocturia    PAIN, CHRONIC NEC 10/06/2006   Qualifier: Diagnosis of   By: Krystal RN, Leeroy       Pneumonia    Prostate disease    Right sided temporal headache 05/22/2018   S/P cervical spinal fusion 03/03/2022   With persistent cervical pain and palpable screws Led to disability History of attempt to dig furrow in skull to fix it Last surgery 1992   Senile ecchymosis 01/21/2019   Septic arthritis of wrist, left (HCC) 11/08/2022   Septic infrapatellar bursitis of right knee 11/07/2022   Staphylococcal Arthritis of Right Knee (Updated 01/30/2023) MRI  01/17/2023 shows worsening findings:  Worsening tear of posterior horn medial meniscus with large radial component Worsening subcortical stress fracture/osteochondral lesion of medial femoral condyle New subcortical stress fracture/osteochondral lesion of medial tibial plateau Moderate-to-large effusion with worsened synovitis    Staphylococcal arthritis of left wrist (HCC) 11/07/2022   Staphylococcal arthritis of right knee (HCC) 11/08/2022   Syncope 11/05/2022   Underweight on examination 05/03/2022   Past Surgical History:  Procedure Laterality Date   BIOPSY  07/19/2019   Procedure: BIOPSY;  Surgeon: San Sandor GAILS, DO;  Location: [facility];  Service: Gastroenterology;;   CERVICAL FUSION  1992   C2/C 3  four surgeries   COLONOSCOPY     ESOPHAGOGASTRODUODENOSCOPY  07/20/2011   Procedure: ESOPHAGOGASTRODUODENOSCOPY (EGD);  Surgeon: Lupita FORBES Avram,  MD;  Location: [facility];  Service: Endoscopy;  Laterality: N/A;   ESOPHAGOGASTRODUODENOSCOPY (EGD) WITH PROPOFOL  N/A 07/19/2019   Procedure: ESOPHAGOGASTRODUODENOSCOPY (EGD) WITH PROPOFOL ;  Surgeon: San Sandor GAILS, DO;  Location: [facility];  Service: Gastroenterology;  Laterality: N/A;   I & D EXTREMITY Left 11/09/2022   Procedure: IRRIGATION AND DEBRIDEMENT LEFT WRIST;  Surgeon: Arlinda Buster, MD;  Location: [facility];  Service: Orthopedics;  Laterality: Left;   SAVORY DILATION  07/20/2011   Procedure: SAVORY DILATION;  Surgeon: Lupita FORBES Commander, MD;  Location: [facility];  Service: Endoscopy;  Laterality: N/A;   SPINAL FUSION  05/06/11   TRANSESOPHAGEAL ECHOCARDIOGRAM (CATH LAB) N/A 07/24/2023   Procedure: TRANSESOPHAGEAL ECHOCARDIOGRAM;  Surgeon: Pietro Redell RAMAN, MD;  Location: [facility] INVAS[facility] CV LAB;  Service: Cardiovascular;  Laterality: N/A;    reports that he quit smoking about 4 years ago. His smoking use included cigarettes. He started smoking about 44 years ago. He has never used smokeless tobacco. He reports that he does  not currently use alcohol after a past usage of about 2.0 standard drinks of alcohol per week. He reports that he does not use drugs. Allergies  Allergen Reactions   Nubain [Nalbuphine Hcl]     Muscle contraction   Family History  Problem Relation Age of Onset   Heart disease Mother    Heart attack Mother 80   Pancreatic cancer Father 87   Pancreatic cancer Brother    Anesthesia problems Neg Hx    Colon cancer Neg Hx    Liver cancer Neg Hx    Stomach cancer Neg Hx    Esophageal cancer Neg Hx    Rectal cancer Neg Hx    Prior to Admission medications   Medication Sig Start Date End Date Taking? Authorizing Provider  JARDIANCE  10 MG TABS tablet Take 10 mg by mouth every morning. 10/14/23  Yes [provider]  metoprolol  succinate (TOPROL -XL) 25 MG 24 hr tablet Take 25 mg by mouth daily. 10/17/23  Yes [provider]  predniSONE (DELTASONE) 10 MG tablet Take 10 mg by mouth 3 (three) times daily. 10/03/23  Yes [provider]  spironolactone  (ALDACTONE ) 25 MG tablet Take 12.5 mg by mouth daily. 09/12/23  Yes [provider]  amitriptyline  (ELAVIL ) 50 MG tablet Take 1 tablet (50 mg total) by mouth at bedtime. 03/22/23   [name], MD  amLODipine  (NORVASC ) 5 MG tablet Take 1 tablet (5 mg total) by mouth daily. 04/25/23   Cheryle Page, MD  bisacodyl  5 MG EC tablet Take 1 tablet (5 mg total) by mouth daily as needed for moderate constipation. 10/25/23   [name], MD  bisoprolol  (ZEBETA ) 5 MG tablet Take 1 tablet (5 mg total) by mouth daily. 07/20/23   [name], NP  Blood Glucose Monitoring Suppl DEVI 1 each by Does not apply route in the morning, at noon, and at bedtime. May substitute to any manufacturer covered by patient's insurance. 10/12/23   [name], MD  cefadroxil  (DURICEF) 500 MG capsule Take 500 mg by mouth 2 (two) times daily. 07/14/23   [provider]  Continuous Glucose Sensor (FREESTYLE LIBRE 3 PLUS SENSOR)  MISC Change sensor every 15 days. 10/12/23   [name], MD  cyclobenzaprine  (FLEXERIL ) 10 MG tablet Take 1 tablet (10 mg total) by mouth 3 (three) times daily as needed. 03/22/23   [name], MD  FeFum-FePoly-FA-B Cmp-C-Biot (INTEGRA PLUS ) CAPS Take 1 capsule by mouth daily. 06/29/23  Sherrod Sherrod, MD  Glucose Blood (BLOOD GLUCOSE TEST STRIPS) STRP 1 each by In Vitro route in the morning, at noon, and at bedtime. May substitute to any manufacturer covered by patient's insurance. 10/12/23 11/11/23  [name], MD  hydrALAZINE  (APRESOLINE ) 50 MG tablet Take 1 tablet (50 mg total) by mouth every 8 (eight) hours. 04/24/23   Cheryle Page, MD  ipratropium-albuterol  (DUONEB) 0.5-2.5 (3) MG/3ML SOLN Take 3 mLs by nebulization every 4 (four) hours as needed. 04/19/23   [name], MD  isosorbide  mononitrate (IMDUR ) 60 MG 24 hr tablet Take 1 tablet (60 mg total) by mouth daily. 04/25/23   Cheryle Page, MD  Lancet Device MISC 1 each by Does not apply route in the morning, at noon, and at bedtime. May substitute to any manufacturer covered by patient's insurance. 10/12/23 11/11/23  [name], MD  Lancets Misc. MISC 1 each by Does not apply route in the morning, at noon, and at bedtime. May substitute to any manufacturer covered by patient's insurance. 10/12/23 11/11/23  [name], MD  Melatonin 1 MG CAPS Take 1 capsule (1 mg total) by mouth at bedtime. 10/12/23   [name], MD  ondansetron  (ZOFRAN -ODT) 4 MG disintegrating tablet Take 1 tablet (4 mg total) by mouth every 8 (eight) hours as needed for nausea or vomiting. 10/25/23   [name], MD  Oxycodone  HCl 10 MG TABS Take 1 tablet (10 mg total) by mouth every 8 (eight) hours as needed. 10/25/23   [name], MD  pantoprazole  (PROTONIX ) 40 MG tablet Take 1 tablet (40 mg total) by mouth daily. 05/03/23   [name], MD  polyethylene glycol (MIRALAX  / GLYCOLAX ) 17 g packet Take 17 g by mouth daily as  needed for moderate constipation. 11/02/22   Cheryle Page, MD  promethazine  (PHENERGAN ) 25 MG suppository Place 1 suppository (25 mg total) rectally every 6 (six) hours as needed for nausea or vomiting. 10/25/23   [name], MD  Respiratory Therapy Supplies (NEBULIZER/TUBING/MOUTHPIECE) KIT 1 each by Does not apply route every 4 (four) hours as needed. 04/19/23   [name], MD  Respiratory Therapy Supplies (NEBULIZER/TUBING/MOUTHPIECE) KIT 1 each by Does not apply route every 4 (four) hours as needed. 04/19/23   [name], MD  rOPINIRole  (REQUIP ) 0.25 MG tablet Take 1 tablet (0.25 mg total) by mouth 3 (three) times daily. 10/12/23   [name], MD  rosuvastatin  (CRESTOR ) 40 MG tablet TAKE 1 TABLET BY MOUTH DAILY. REPLACES ATORVASTATIN  (STOP ATORVASTATIN  IF STILL TAKING) 04/13/23   [name], MD  sacubitril -valsartan  (ENTRESTO ) 24-26 MG Take 1 tablet by mouth 2 (two) times daily. 09/01/23   [name], NP  Semaglutide ,0.25 or 0.5MG /DOS, (OZEMPIC , 0.25 OR 0.5 MG/DOSE,) 2 MG/3ML SOPN Inject 0.25 mg into the skin once a week. 10/25/23   [name], MD  sodium zirconium cyclosilicate  (LOKELMA ) 10 g PACK packet Take 10 g by mouth daily. 09/01/23   [name], NP  SUMAtriptan  (IMITREX ) 50 MG tablet TAKE 1 TABLET (50 MG TOTAL) BY MOUTH DAILY. MAY REPEAT IN 2 HOURS IF HEADACHE PERSISTS OR RECURS. 11/18/22   [name], MD  Vitals:   11/01/23 1200 11/01/23 1309 11/01/23 1330 11/01/23 1535  BP: (!) 127/48  (!) 121/48 107/70  Pulse: (!) 56  (!) 54 61  Resp: 10  15 17   Temp:  98 F (36.7 C)  97.8 F (36.6 C)  TempSrc:  Oral    SpO2: 100%  99% 100%   Physical Exam Vitals reviewed.  Constitutional:      General: He is not in acute distress.    Appearance: He is not ill-appearing.  HENT:     Head: Normocephalic and atraumatic.  Eyes:     Extraocular Movements:  Extraocular movements intact.  Cardiovascular:     Rate and Rhythm: Normal rate and regular rhythm.     Pulses: Normal pulses.     Heart sounds: Normal heart sounds.  Pulmonary:     Breath sounds: Normal breath sounds.  Abdominal:     General: There is no distension.     Palpations: Abdomen is soft.     Tenderness: There is no abdominal tenderness.  Musculoskeletal:     Right lower leg: No edema.     Left lower leg: No edema.  Skin:    General: Skin is warm.  Neurological:     General: No focal deficit present.     Mental Status: He is alert and oriented to person, place, and time.  Psychiatric:        Mood and Affect: Mood normal.        Behavior: Behavior normal.     Labs on Admission: I have personally reviewed following labs and imaging studies CBC: Recent Labs  Lab 11/01/23 1109  WBC 8.4  NEUTROABS 5.9  HGB 13.1  HCT 38.0*  MCV 90.0  PLT 146*   Basic Metabolic Panel: Recent Labs  Lab 11/01/23 1109  NA 132*  K 4.7  CL 99  CO2 18*  GLUCOSE 126*  BUN 48*  CREATININE 3.00*  CALCIUM  9.2   GFR: CrCl cannot be calculated (Unknown ideal weight.). Liver Function Tests: Recent Labs  Lab 11/01/23 1109  AST 29  ALT 10  ALKPHOS 101  BILITOT 0.5  PROT 6.9  ALBUMIN 3.7   No results for input(s): LIPASE, AMYLASE in the last 168 hours. No results for input(s): AMMONIA in the last 168 hours. Recent Labs    06/07/23 0920 06/21/23 1209 06/29/23 1126 07/20/23 1124 08/14/23 1037 08/29/23 1056 08/30/23 1548 09/14/23 1145 10/10/23 0950 11/01/23 1109  BUN 23 32* 27* 30* 34* 32* 35* 27* 38* 48*  CREATININE 1.60* 1.87* 1.77* 1.85* 2.18* 2.10* 2.21* 1.88* 2.38* 3.00*    Cardiac Enzymes: No results for input(s): CKTOTAL, CKMB, CKMBINDEX, TROPONINI in the last 168 hours. BNP (last 3 results) Recent Labs    11/01/23 1109  PROBNP 634.0*   HbA1C: No results for input(s): HGBA1C in the last 72 hours. CBG: Recent Labs  Lab 11/01/23 1534   GLUCAP 81   Lipid Profile: No results for input(s): CHOL, HDL, LDLCALC, TRIG, CHOLHDL, LDLDIRECT in the last 72 hours. Thyroid  Function Tests: No results for input(s): TSH, T4TOTAL, FREET4, T3FREE, THYROIDAB in the last 72 hours. Anemia Panel: No results for input(s): VITAMINB12, FOLATE, FERRITIN, TIBC, IRON, RETICCTPCT in the last 72 hours. Urine analysis:    Component Value Date/Time   COLORURINE YELLOW 02/17/2023 1754   APPEARANCEUR CLEAR 02/17/2023 1754   LABSPEC 1.011 02/17/2023 1754   PHURINE 5.0 02/17/2023 1754   GLUCOSEU NEGATIVE 02/17/2023 1754   HGBUR MODERATE (A) 02/17/2023 1754   BILIRUBINUR NEGATIVE  02/17/2023 1754   BILIRUBINUR n 09/08/2015 1402   KETONESUR NEGATIVE 02/17/2023 1754   PROTEINUR 30 (A) 02/17/2023 1754   UROBILINOGEN 0.2 09/08/2015 1402   NITRITE NEGATIVE 02/17/2023 1754   LEUKOCYTESUR NEGATIVE 02/17/2023 1754   Radiological Exams on Admission: DG Chest 2 View Result Date: 11/01/2023 CLINICAL DATA:  Dyspnea on exertion. EXAM: CHEST - 2 VIEW COMPARISON:  04/24/2023 and 04/20/2023 as well as 11/05/2022 FINDINGS: Lungs are adequately inflated without acute airspace consolidation or effusion. No pneumothorax. Possible small nodular opacity over the right mid to lower lung. Cardiomediastinal silhouette and remainder of the exam is unchanged. IMPRESSION: 1. No acute cardiopulmonary disease. 2. Possible small nodular opacity over the right mid to lower lung. Recommend follow-up chest radiograph 3 months. If persistent, recommend noncontrast chest CT for further evaluation. Electronically Signed   By: Toribio Agreste M.D.   On: 11/01/2023 10:50   Data Reviewed: Relevant notes from primary care and specialist visits, past discharge summaries as available in EHR, including Care Everywhere . Prior diagnostic testing as pertinent to current admission diagnoses, Updated medications and problem lists for reconciliation .ED course,  including vitals, labs, imaging, treatment and response to treatment,Triage notes, nursing and pharmacy notes and ED provider's notes.Notable results as noted in HPI.Discussed case with EDMD/ ED APP/ or Specialty MD on call and as needed.  Assessment & Plan  >>SOB/ DOE: Differentials include CHF, valvular etiology or worsening aortic insufficiency due to his history of bacterial endocarditis,, have requested cardiology consult for same. 2D echo per cardiology will hold patient's bisoprolol  Aldactone . Continuous pulse oximetry.  >> Congestive heart failure with improved ejection fraction. Patient is clinically stable and euvolemic.  >> Diabetes mellitus type 2: Glycemic protocol.   >>Severe aortic regurgitation / Bacterial endocarditis Cardiology consult.   >> AKI on CKD stage IIIb: Lab Results  Component Value Date   CREATININE 3.00 (H) 11/01/2023   CREATININE 2.38 (H) 10/10/2023   CREATININE 1.88 (H) 09/14/2023  Suspect combination of medication and decreased p.o. intake.    >> Paroxysmal A-fib: EKG today does show sinus rhythm at 62 PR 188 and QTc of 479, left atrial enlargement. Currently in sinus rhythm.  >>Essential hypertension: Vitals:   11/01/23 0945 11/01/23 1030 11/01/23 1100 11/01/23 1200  BP: (!) 176/52 (!) 105/44 (!) 108/43 (!) 127/48   11/01/23 1330 11/01/23 1535  BP: (!) 121/48 107/70  Per cardiology will continue amlodipine  and hydralazine  and hold IF SBP IS 130 OR BELOW.    >> Anemia:    Latest Ref Rng & Units 11/01/2023   11:09 AM 10/10/2023    9:50 AM 09/14/2023   11:45 AM  CBC  WBC 4.0 - 10.5 K/uL 8.4  7.2  5.9   Hemoglobin 13.0 - 17.0 g/dL 86.8  9.7  9.9   Hematocrit 39.0 - 52.0 % 38.0  30.6  30.0   Platelets 150 - 400 K/uL 146  144  115   Suspect hemoconcentration. Will follow.  >> Thrombocytopenia:  08/29/23 10:56 09/14/23 11:45 10/10/23 09:50 11/01/23 11:09  Platelets 164 115 (L) 144 (L) 146 (L)  Low.   DVT prophylaxis:   Heparin . Consults:  Cardiology.   Advance Care Planning:    Code Status: Prior   Family Communication:  None.  Disposition Plan:  Home  Severity of Illness: The appropriate patient status for this patient is OBSERVATION. Observation status is judged to be reasonable and necessary in order to provide the required intensity of service to ensure the patient's safety. The patient's  presenting symptoms, physical exam findings, and initial radiographic and laboratory data in the context of their medical condition is felt to place them at decreased risk for further clinical deterioration. Furthermore, it is anticipated that the patient will be medically stable for discharge from the hospital within 2 midnights of admission.   Unresulted Labs (From admission, onward)     Start     Ordered   11/02/23 0500  CBC with Differential/Platelet  Tomorrow morning,   R        11/01/23 2046   11/02/23 0500  Comprehensive metabolic panel with GFR  Tomorrow morning,   R        11/01/23 2046   11/02/23 0500  Magnesium   Tomorrow morning,   R        11/01/23 2046   11/02/23 0500  Phosphorus  Tomorrow morning,   R        11/01/23 2046   11/01/23 0959  Culture, blood (routine x 2)  BLOOD CULTURE X 2,   STAT      11/01/23 0959   11/01/23 0959  Urinalysis, w/ Reflex to Culture (Infection Suspected) -Urine, Clean Catch  Once,   R       Question:  Specimen Source  Answer:  Urine, Clean Catch   11/01/23 0959            Meds ordered this encounter  Medications   lactated ringers  bolus 500 mL   insulin  aspart (novoLOG ) injection 0-15 Units    Correction coverage::   Moderate (average weight, post-op)    CBG < 70::   implement hypoglycemia protocol    CBG 70 - 120::   0 units    CBG 121 - 150::   2 units    CBG 151 - 200::   3 units    CBG 201 - 250::   5 units    CBG 251 - 300::   8 units    CBG 301 - 350::   11 units    CBG 351 - 400::   15 units    CBG > 400:   call MD and obtain STAT lab  verification   amitriptyline  (ELAVIL ) tablet 50 mg   amLODipine  (NORVASC ) tablet 5 mg   DISCONTD: bisoprolol  (ZEBETA ) tablet 5 mg   hydrALAZINE  (APRESOLINE ) tablet 50 mg   ipratropium-albuterol  (DUONEB) 0.5-2.5 (3) MG/3ML nebulizer solution 3 mL   pantoprazole  (PROTONIX ) EC tablet 40 mg   rOPINIRole  (REQUIP ) tablet 0.25 mg   rosuvastatin  (CRESTOR ) tablet 40 mg   DISCONTD: spironolactone  (ALDACTONE ) tablet 12.5 mg   SUMAtriptan  (IMITREX ) tablet 50 mg   heparin  injection 5,000 Units     Orders Placed This Encounter  Procedures   Resp panel by RT-PCR (RSV, Flu A&B, Covid) Anterior Nasal Swab   Culture, blood (routine x 2)   DG Chest 2 View   Comprehensive metabolic panel   CBC with Differential   Pro Brain natriuretic peptide   Urinalysis, w/ Reflex to Culture (Infection Suspected) -Urine, Clean Catch   Lactic acid, plasma   Glucose, capillary   CBC with Differential/Platelet   Comprehensive metabolic panel with GFR   Magnesium    Phosphorus   Diet Carb Modified Fluid consistency: Thin; Room service appropriate? Yes   Check Pulse Oximetry while ambulating   Cardiac Monitoring Continuous x 48 hours Indications for use: Other; Other indications for use: sob   Apply Diabetes Mellitus Care Plan   STAT CBG when hypoglycemia is suspected. If treated, recheck every 15  minutes after each treatment until CBG >/= 70 mg/dl   Refer to Hypoglycemia Protocol Sidebar Report for treatment of CBG < 70 mg/dl   No HS correction Insulin    Consult to hospitalist   Pulse oximetry, continuous   ED EKG   EKG 12-Lead   ECHOCARDIOGRAM COMPLETE   Saline lock IV   Place in observation (patient's expected length of stay will be less than 2 midnights)   VAS US  LOWER EXTREMITY VENOUS (DVT)    Author: Mario LULLA Blanch, MD 12 pm- 8 pm. [facility]. 11/01/2023 8:47 PM Please note for any communication after hours contact [facility] Assigned provider on call on Amion.

## 2023-11-01 NOTE — Progress Notes (Signed)
 Admit for:  Admission requested for shortness of breath hypoxia on ambulation and concerns for CHF and acute kidney injury.  Patient admitted to med/tele floor.

## 2023-11-01 NOTE — Consult Note (Cosign Needed Addendum)
 Cardiology Consultation   Patient ID: Michael Bender MRN: [id number]; DOB: 10-May-1945  Admit date: 11/01/2023 Date of Consult: 11/01/2023  PCP:  Michael Bernardino MATSU, MD   [facility] Providers Cardiologist:  Michael Gull, MD  Cardiology APP:  Michael Lum CROME, NP       Patient Profile: Michael Bender is a 78 y.o. 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  History of Present Illness: Michael Bender is a 78 year old male with prior cardiac history listed below.  On 11/2022 the patient was hospitalized for recurrent syncope, weakness, and encephalopathy.  Blood cultures showed the patient had MSSA bacteremia.  This was suspected to be secondary to septic arthritis. Patient was started on IV antibiotics.  Orthopedics did a left wrist capsulotomy and washout.  The patient was placed on 6 weeks of IV antibiotics.  During the hospitalization on 11/2022 the patient was also found to be in new onset atrial fibrillation.  On 04/2023 the patient was admitted with acute on chronic heart failure.  The echocardiogram showed a new moderately reduced LVEF of 40 to 45%.  The patient and AKI so the cath was deferred and Entresto  and Aldactone  were not started. Later on 3/25 the patient was seen at the heart failure clinic and a cardiac MRI was ordered to evaluate the cause of the heart failure.  On 5/25 the cardiac MRI was done and showed a mildly reduced LV systolic function of 42%, ascending aortic dilation of 43 mm, moderate to severe aortic regurgitation, and moderate LV dilation.  The patient was recommended for a TEE to evaluate the aortic regurgitation.  The follow-up TEE was done on 07/24/2023 and showed an LVEF of 50 to 55%, an  oscillating linear density on the aortic valve concerning for a vegetation, severe aortic insufficiency, mildly reduced RV systolic function, mild MR, and moderate plaque in the descending aorta.  Patient followed up with ID on 08/03/2023.  This suspected the vegetation was from the prior infection on 11/2022.  The patient did not undergo any additional biotic treatment as he had previously undergone IV antibiotics for presumptive endocarditis.  On 08/30/2023 and was seen by CT surgery and was recommended for surgical aortic valve replacement because he was not a candidate for TAVR with the possible vegetation and aortic insufficiency.  Patient elected to hold off on surgery and continue medical therapy.  At last visit with [facility] on 10/10/2023 the patient was on Entresto , bisoprolol , hydralazine , and Imdur .  Was reportedly seen at the emergency department because he was short of breath.  In the ER he reportedly had a significant drop in his SpO2 while ambulating.  The patient's family reported that he has not been eating normally for the past 3 days.  On interview patient reported that he took adrenaline several weeks ago and that is why he is currently short of breath.  It is possible the patient was told about his Ozempic  that he was recently started on.  When interviewing the patient he denied any recent worsening shortness of breath.  He was sitting up comfortably on the bed during interview.  Patient reported that he is minimally active at home because of concerns with his aortic insufficiency.  Labs showed elevated proBNP of 634, elevated high-sensitivity troponins 67 > 62,  normal hemoglobin of 13.1, potassium of 4.7, AKI with a creatinine of 3.0, and respiratory panel was negative  Chest x-ray showed no acute cardiopulmonary disease  EKG showed normal sinus rhythm with a rate of 62, prolonged QTc with a QTcB of 479   Past Medical History:  Diagnosis Date   Acute combined systolic  and diastolic heart failure (HCC) 04/22/2023   Acute on chronic diastolic CHF (congestive heart failure) (HCC) 04/20/2023   AKI (acute kidney injury) 10/28/2022   Arthritis    Back   Balance problems 02/21/2023   Blood transfusion    as a child   Cachexia 05/03/2022   Added based on temporal wasting noted on exam, Body mass index is 19.92 kg/m. At 05/03/2022 encounter     Cervical discitis 11/10/2022   Chronic back pain    COPD (chronic obstructive pulmonary disease) (HCC)    Disturbance of skin sensation 05/22/2018   Effusion of right knee 02/21/2023   Effusion of right knee joint 01/16/2023   Elevated troponin 04/22/2023   Essential hypertension 03/05/2007   Qualifier: Diagnosis of   By: Krystal MD, Reyes LABOR      Finger pain, left 11/08/2022   Gastric erosion    Gastritis and gastroduodenitis    Gastrointestinal hemorrhage with melena 11/20/2018   GIB (gastrointestinal bleeding) 07/18/2019   Hardware complicating wound infection 11/10/2022   Headache(784.0)    last one 6 months ago   Hemorrhoids, internal    High risk medication use 03/03/2022   History of duodenal ulcer    History of fusion of cervical spine 03/03/2022   With persistent cervical pain and palpable screws  Led to disability  History of attempt to dig furrow in skull to fix it  Last surgery 1992     History of lumbar fusion 03/03/2022   RE-OPERATIVE DIAGNOSIS:  lumbar stenosis synovial cyst lumbar spondylosis spondylolisthesis lumbar radiculoapthy L4/5   PROCEDURE:  Procedure(s): POSTERIOR LUMBAR FUSION 1 LEVEL with resection of synovial cyst     History of upper gastrointestinal bleeding 03/03/2022   Duodenal ulcer 2021 Dr. Avram   HLD (hyperlipidemia)    Hypertension    Hypokalemia 04/22/2023   Hypomagnesemia 04/22/2023   Infected blister of left index finger 11/07/2022   Intractable pain 03/03/2022   Loss of weight    Wt Readings from Last 10 Encounters:  04/05/22  146 lb (66.2 kg)  03/03/22  143 lb  9.6 oz (65.1 kg)  07/14/21  153 lb (69.4 kg)  12/14/20  147 lb 12.8 oz (67 kg)  11/13/19  154 lb (69.9 kg)  09/16/19  146 lb (66.2 kg)  08/06/19  146 lb (66.2 kg)  07/19/19  144 lb 13.5 oz (65.7 kg)  07/17/19  148 lb (67.1 kg)  07/09/19  147 lb 9.6 oz (67 kg)         MSSA bacteremia 11/07/2022   Neck rigidity    post cervical fusion   Nocturia    PAIN, CHRONIC NEC 10/06/2006   Qualifier: Diagnosis of   By: Krystal RN, Leeroy       Pneumonia    Prostate disease    Right sided temporal headache 05/22/2018   S/P cervical spinal fusion 03/03/2022   With persistent cervical pain and palpable screws Led to disability History of attempt to dig furrow in skull to fix it Last surgery 1992   Senile ecchymosis 01/21/2019   Septic arthritis of wrist, left (HCC) 11/08/2022   Septic infrapatellar bursitis of right knee 11/07/2022  Staphylococcal Arthritis of Right Knee (Updated 01/30/2023) MRI 01/17/2023 shows worsening findings:  Worsening tear of posterior horn medial meniscus with large radial component Worsening subcortical stress fracture/osteochondral lesion of medial femoral condyle New subcortical stress fracture/osteochondral lesion of medial tibial plateau Moderate-to-large effusion with worsened synovitis    Staphylococcal arthritis of left wrist (HCC) 11/07/2022   Staphylococcal arthritis of right knee (HCC) 11/08/2022   Syncope 11/05/2022   Underweight on examination 05/03/2022    Past Surgical History:  Procedure Laterality Date   BIOPSY  07/19/2019   Procedure: BIOPSY;  Surgeon: San Sandor GAILS, DO;  Location: [facility];  Service: Gastroenterology;;   CERVICAL FUSION  1992   C2/C 3  four surgeries   COLONOSCOPY     ESOPHAGOGASTRODUODENOSCOPY  07/20/2011   Procedure: ESOPHAGOGASTRODUODENOSCOPY (EGD);  Surgeon: Lupita FORBES Commander, MD;  Location: [facility];  Service: Endoscopy;  Laterality: N/A;   ESOPHAGOGASTRODUODENOSCOPY (EGD) WITH PROPOFOL  N/A 07/19/2019   Procedure:  ESOPHAGOGASTRODUODENOSCOPY (EGD) WITH PROPOFOL ;  Surgeon: San Sandor GAILS, DO;  Location: [facility];  Service: Gastroenterology;  Laterality: N/A;   I & D EXTREMITY Left 11/09/2022   Procedure: IRRIGATION AND DEBRIDEMENT LEFT WRIST;  Surgeon: Arlinda Buster, MD;  Location: [facility];  Service: Orthopedics;  Laterality: Left;   SAVORY DILATION  07/20/2011   Procedure: SAVORY DILATION;  Surgeon: Lupita FORBES Commander, MD;  Location: [facility];  Service: Endoscopy;  Laterality: N/A;   SPINAL FUSION  05/06/11   TRANSESOPHAGEAL ECHOCARDIOGRAM (CATH LAB) N/A 07/24/2023   Procedure: TRANSESOPHAGEAL ECHOCARDIOGRAM;  Surgeon: Pietro Redell RAMAN, MD;  Location: [facility] INVASIVE CV LAB;  Service: Cardiovascular;  Laterality: N/A;     Home Medications:  Prior to Admission medications   Medication Sig Start Date End Date Taking? Authorizing Provider  JARDIANCE  10 MG TABS tablet Take 10 mg by mouth every morning. 10/14/23  Yes [provider]  metoprolol  succinate (TOPROL -XL) 25 MG 24 hr tablet Take 25 mg by mouth daily. 10/17/23  Yes [provider]  predniSONE (DELTASONE) 10 MG tablet Take 10 mg by mouth 3 (three) times daily. 10/03/23  Yes [provider]  spironolactone  (ALDACTONE ) 25 MG tablet Take 12.5 mg by mouth daily. 09/12/23  Yes [provider]  amitriptyline  (ELAVIL ) 50 MG tablet Take 1 tablet (50 mg total) by mouth at bedtime. 03/22/23   Michael Bernardino MATSU, MD  amLODipine  (NORVASC ) 5 MG tablet Take 1 tablet (5 mg total) by mouth daily. 04/25/23   Cheryle Page, MD  bisacodyl  5 MG EC tablet Take 1 tablet (5 mg total) by mouth daily as needed for moderate constipation. 10/25/23   Michael Bernardino MATSU, MD  bisoprolol  (ZEBETA ) 5 MG tablet Take 1 tablet (5 mg total) by mouth daily. 07/20/23   Michael Lum CROME, NP  Blood Glucose Monitoring Suppl DEVI 1 each by Does not apply route in the morning, at noon, and at bedtime. May substitute to any manufacturer covered by patient's insurance.  10/12/23   Michael Bernardino MATSU, MD  cefadroxil  (DURICEF) 500 MG capsule Take 500 mg by mouth 2 (two) times daily. 07/14/23   [provider]  Continuous Glucose Sensor (FREESTYLE LIBRE 3 PLUS SENSOR) MISC Change sensor every 15 days. 10/12/23   Michael Bernardino MATSU, MD  cyclobenzaprine  (FLEXERIL ) 10 MG tablet Take 1 tablet (10 mg total) by mouth 3 (three) times daily as needed. 03/22/23   Michael Bernardino MATSU, MD  FeFum-FePoly-FA-B Cmp-C-Biot (INTEGRA PLUS ) CAPS Take 1 capsule by mouth daily. 06/29/23   Sherrod Sherrod, MD  Glucose Blood (BLOOD GLUCOSE TEST STRIPS) STRP 1 each by In Vitro route in the morning, at noon, and at bedtime. May substitute to any manufacturer covered by patient's insurance. 10/12/23 11/11/23  Michael Bernardino MATSU, MD  hydrALAZINE  (APRESOLINE ) 50 MG tablet Take 1 tablet (50 mg total) by mouth every 8 (eight) hours. 04/24/23   Cheryle Page, MD  ipratropium-albuterol  (DUONEB) 0.5-2.5 (3) MG/3ML SOLN Take 3 mLs by nebulization every 4 (four) hours as needed. 04/19/23   Michael Bernardino MATSU, MD  isosorbide  mononitrate (IMDUR ) 60 MG 24 hr tablet Take 1 tablet (60 mg total) by mouth daily. 04/25/23   Cheryle Page, MD  Lancet Device MISC 1 each by Does not apply route in the morning, at noon, and at bedtime. May substitute to any manufacturer covered by patient's insurance. 10/12/23 11/11/23  Michael Bernardino MATSU, MD  Lancets Misc. MISC 1 each by Does not apply route in the morning, at noon, and at bedtime. May substitute to any manufacturer covered by patient's insurance. 10/12/23 11/11/23  Michael Bernardino MATSU, MD  Melatonin 1 MG CAPS Take 1 capsule (1 mg total) by mouth at bedtime. 10/12/23   Michael Bernardino MATSU, MD  ondansetron  (ZOFRAN -ODT) 4 MG disintegrating tablet Take 1 tablet (4 mg total) by mouth every 8 (eight) hours as needed for nausea or vomiting. 10/25/23   Michael Bernardino MATSU, MD  Oxycodone  HCl 10 MG TABS Take 1 tablet (10 mg total) by mouth every 8 (eight) hours as needed. 10/25/23   Michael Bernardino MATSU, MD  pantoprazole  (PROTONIX ) 40 MG tablet Take 1 tablet (40 mg total) by mouth daily. 05/03/23   Michael Bernardino MATSU, MD  polyethylene glycol (MIRALAX  / GLYCOLAX ) 17 g packet Take 17 g by mouth daily as needed for moderate constipation. 11/02/22   Cheryle Page, MD  promethazine  (PHENERGAN ) 25 MG suppository Place 1 suppository (25 mg total) rectally every 6 (six) hours as needed for nausea or vomiting. 10/25/23   Michael Bernardino MATSU, MD  Respiratory Therapy Supplies (NEBULIZER/TUBING/MOUTHPIECE) KIT 1 each by Does not apply route every 4 (four) hours as needed. 04/19/23   Michael Bernardino MATSU, MD  Respiratory Therapy Supplies (NEBULIZER/TUBING/MOUTHPIECE) KIT 1 each by Does not apply route every 4 (four) hours as needed. 04/19/23   Michael Bernardino MATSU, MD  rOPINIRole  (REQUIP ) 0.25 MG tablet Take 1 tablet (0.25 mg total) by mouth 3 (three) times daily. 10/12/23   Michael Bernardino MATSU, MD  rosuvastatin  (CRESTOR ) 40 MG tablet TAKE 1 TABLET BY MOUTH DAILY. REPLACES ATORVASTATIN  (STOP ATORVASTATIN  IF STILL TAKING) 04/13/23   Michael Bernardino MATSU, MD  sacubitril -valsartan  (ENTRESTO ) 24-26 MG Take 1 tablet by mouth 2 (two) times daily. 09/01/23   Michael Lum CROME, NP  Semaglutide ,0.25 or 0.5MG /DOS, (OZEMPIC , 0.25 OR 0.5 MG/DOSE,) 2 MG/3ML SOPN Inject 0.25 mg into the skin once a week. 10/25/23   Michael Bernardino MATSU, MD  sodium zirconium cyclosilicate  (LOKELMA ) 10 g PACK packet Take 10 g by mouth daily. 09/01/23   Michael Lum CROME, NP  SUMAtriptan  (IMITREX ) 50 MG tablet TAKE 1 TABLET (50 MG TOTAL) BY MOUTH DAILY. MAY REPEAT IN 2 HOURS IF HEADACHE PERSISTS OR RECURS. 11/18/22   Michael Bernardino MATSU, MD    Scheduled Meds:  amitriptyline   50 mg Oral QHS   [START ON 11/02/2023] amLODipine   5 mg Oral Daily   bisoprolol   5 mg Oral Daily   hydrALAZINE   50 mg Oral Q8H   insulin  aspart  0-15 Units Subcutaneous TID WC   pantoprazole   40  mg Oral Daily   rOPINIRole   0.25 mg Oral TID   rosuvastatin   40 mg Oral Daily   [START ON 11/02/2023]  spironolactone   12.5 mg Oral Daily   Continuous Infusions:  PRN Meds: ipratropium-albuterol , SUMAtriptan   Allergies:    Allergies  Allergen Reactions   Nubain [Nalbuphine Hcl]     Muscle contraction    Social History:   Social History   Socioeconomic History   Marital status: Widowed    Spouse name: Not on file   Number of children: 2   Years of education: Not on file   Highest education level: High school graduate  Occupational History   Occupation: Disabled  Tobacco Use   Smoking status: Former    Current packs/day: 0.00    Types: Cigarettes    Start date: 12/12/1978    Quit date: 12/12/2018    Years since quitting: 4.8   Smokeless tobacco: Never  Vaping Use   Vaping status: Never Used  Substance and Sexual Activity   Alcohol use: Not Currently    Alcohol/week: 2.0 standard drinks of alcohol    Types: 2 Shots of liquor per week   Drug use: No   Sexual activity: Not on file  Other Topics Concern   Not on file  Social History Narrative   Patient is divorced 2 sons he is a retired Engineer, 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  He drinks about a liter of caffeinated drinks a day past smoker no drug use no alcohol.   Social Drivers of Corporate investment banker Strain: Low Risk  (04/24/2023)   Overall Financial Resource Strain (CARDIA)    Difficulty of Paying Living Expenses: Not very hard  Food Insecurity: No Food Insecurity (11/01/2023)   Hunger Vital Sign    Worried About Running Out of Food in the Last Year: Never true    Ran Out of Food in the Last Year: Never true  Transportation Needs: No Transportation Needs (11/01/2023)   PRAPARE - Administrator, Civil Service (Medical): No    Lack of Transportation (Non-Medical): No  Physical Activity: Insufficiently Active (10/13/2022)   Exercise Vital Sign    Days of Exercise per Week: 3 days    Minutes of Exercise per Session: 30 min  Stress: No Stress Concern Present  (10/13/2022)   [employer] of Occupational Health - Occupational Stress Questionnaire    Feeling of Stress : Not at all  Social Connections: Socially Isolated (11/01/2023)   Social Connection and Isolation Panel    Frequency of Communication with Friends and Family: Twice a week    Frequency of Social Gatherings with Friends and Family: Twice a week    Attends Religious Services: Never    Database administrator or Organizations: No    Attends Banker Meetings: Never    Marital Status: Widowed  Intimate Partner Violence: Not At Risk (11/01/2023)   Humiliation, Afraid, Rape, and Kick questionnaire    Fear of Current or Ex-Partner: No    Emotionally Abused: No    Physically Abused: No    Sexually Abused: No    Family History:    Family History  Problem Relation Age of Onset   Heart disease Mother    Heart attack Mother 18   Pancreatic cancer Father 51   Pancreatic cancer Brother    Anesthesia problems Neg Hx    Colon cancer Neg Hx    Liver  cancer Neg Hx    Stomach cancer Neg Hx    Esophageal cancer Neg Hx    Rectal cancer Neg Hx      ROS:  Please see the history of present illness.   All other ROS reviewed and negative.     Physical Exam/Data: Vitals:   11/01/23 1200 11/01/23 1309 11/01/23 1330 11/01/23 1535  BP: (!) 127/48  (!) 121/48 107/70  Pulse: (!) 56  (!) 54 61  Resp: 10  15 17   Temp:  98 F (36.7 C)  97.8 F (36.6 C)  TempSrc:  Oral    SpO2: 100%  99% 100%    Intake/Output Summary (Last 24 hours) at 11/01/2023 1614 Last data filed at 11/01/2023 1209 Gross per 24 hour  Intake 500 ml  Output --  Net 500 ml      10/12/2023   11:06 AM 10/10/2023    9:00 AM 09/25/2023   12:50 PM  Last 3 Weights  Weight (lbs) 153 lb 3.2 oz 153 lb 156 lb 12.8 oz  Weight (kg) 69.491 kg 69.4 kg 71.124 kg     There is no height or weight on file to calculate BMI.   Physical exam per Dr Raford  EKG:  The EKG was personally reviewed and demonstrates:    normal sinus rhythm with a rate of 62, prolonged QTc with a QTcB of 479 Telemetry:  Telemetry was personally reviewed and demonstrates: Is not currently on telemetry  Relevant CV Studies: Echo pending  Laboratory Data: High Sensitivity Troponin:  No results for input(s): TROPONINIHS in the last 720 hours.   Chemistry Recent Labs  Lab 11/01/23 1109  NA 132*  K 4.7  CL 99  CO2 18*  GLUCOSE 126*  BUN 48*  CREATININE 3.00*  CALCIUM  9.2  GFRNONAA 21*  ANIONGAP 16*    Recent Labs  Lab 11/01/23 1109  PROT 6.9  ALBUMIN 3.7  AST 29  ALT 10  ALKPHOS 101  BILITOT 0.5   Lipids No results for input(s): CHOL, TRIG, HDL, LABVLDL, LDLCALC, CHOLHDL in the last 168 hours.  Hematology Recent Labs  Lab 11/01/23 1109  WBC 8.4  RBC 4.22  HGB 13.1  HCT 38.0*  MCV 90.0  MCH 31.0  MCHC 34.5  RDW 13.5  PLT 146*   Thyroid  No results for input(s): TSH, FREET4 in the last 168 hours.  BNP Recent Labs  Lab 11/01/23 1109  PROBNP 634.0*    DDimer No results for input(s): DDIMER in the last 168 hours.  Radiology/Studies:  DG Chest 2 View Result Date: 11/01/2023 CLINICAL DATA:  Dyspnea on exertion. EXAM: CHEST - 2 VIEW COMPARISON:  04/24/2023 and 04/20/2023 as well as 11/05/2022 FINDINGS: Lungs are adequately inflated without acute airspace consolidation or effusion. No pneumothorax. Possible small nodular opacity over the right mid to lower lung. Cardiomediastinal silhouette and remainder of the exam is unchanged. IMPRESSION: 1. No acute cardiopulmonary disease. 2. Possible small nodular opacity over the right mid to lower lung. Recommend follow-up chest radiograph 3 months. If persistent, recommend noncontrast chest CT for further evaluation. Electronically Signed   By: Toribio Agreste M.D.   On: 11/01/2023 10:50     Assessment and Plan: Michael Bender is a 78 y.o. 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  Hypertension Shortness of breath Severe aortic regurgitation secondary to bacterial endocarditis Patient was previously seen by CT surgery and recommended for an aortic valve replacement the patient declined to undergo this procedure.  The patient was felt to be a poor candidate for a TAVR. Patient was reportedly hospitalized for shortness of breath.  On interview he denied any shortness of breath but reported that he does minimal physical activity at home.  He continued to decline to get an aortic valve replacement. Labs showed elevated proBNP of 634, elevated high-sensitivity troponins 67 > 62, normal hemoglobin of 13.1, potassium of 4.7, AKI with a creatinine of 3.0, and respiratory panel was negative. Elevated and flat troponins are most consistent with demand ischemia from the patient's severe aortic regurgitation. Patient does not appear to be significantly volume overloaded on exam Order echocardiogram Stop bisoprolol  due to the patient having aortic regurgitation. Stop Aldactone  due to AKI Continue amlodipine , hydralazine    Atrial fibrillation Was diagnosed on 10/24 he was not placed on anticoagulation at that time because was not felt to be a good candidate.  On EKG patient is in sinus rhythm.   Hyperlipidemia Continue Crestor  40 mg daily   QTc prolongation Recommend avoiding QT prolonging agents.   Otherwise manage per primary    Risk Assessment/Risk Scores:     New York  Heart Association (NYHA) Functional Class NYHA Class III  CHA2DS2-VASc Score = 6  {This indicates a 9.7% annual risk of stroke. The patient's score is based upon: CHF History: 1 HTN History: 1 Diabetes History: 0 Stroke History: 2 Vascular Disease History: 0 Age Score: 2 Gender  Score: 0       For questions or updates, please contact [facility] Please consult [email or web address] for contact info under     Signed, Morse Clause, PA-C  11/01/2023 4:14 PM

## 2023-11-01 NOTE — ED Notes (Signed)
 Purple man Green.

## 2023-11-01 NOTE — ED Notes (Addendum)
 Pt was ambulated about 40 feetand started to complain of SOB... SpO2 decreased to around 90%.... Provider aware.SABRASABRA

## 2023-11-01 NOTE — ED Notes (Signed)
 Report given to [facility].

## 2023-11-01 NOTE — ED Provider Notes (Signed)
 [place] [facility] AT [facility] Provider Note   CSN: [id number] Arrival date & time: 11/01/23  9063     Patient presents with: Shortness of Breath   Michael Bender is a 78 y.o. male.   HPI 78 year old male presents with shortness of breath on exertion.  History is taken from family and patient.  For the past several days has been short of breath on exertion, can barely walk 5 feet without getting short of breath.  He denies any other specific complaints such as leg swelling, chest pain, fever, cough.  He denies any bloody or black stools.  He has had poor appetite over the last several days.  His doctor put him on Ozempic  for diabetes but he has not had any in 2 weeks.  Just feels like he does not have an appetite.  Prior to Admission medications   Medication Sig Start Date End Date Taking? Authorizing Provider  JARDIANCE  10 MG TABS tablet Take 10 mg by mouth every morning. 10/14/23  Yes [provider]  metoprolol  succinate (TOPROL -XL) 25 MG 24 hr tablet Take 25 mg by mouth daily. 10/17/23  Yes [provider]  predniSONE (DELTASONE) 10 MG tablet Take 10 mg by mouth 3 (three) times daily. 10/03/23  Yes [provider]  spironolactone  (ALDACTONE ) 25 MG tablet Take 12.5 mg by mouth daily. 09/12/23  Yes [provider]  amitriptyline  (ELAVIL ) 50 MG tablet Take 1 tablet (50 mg total) by mouth at bedtime. 03/22/23   [name], MD  amLODipine  (NORVASC ) 5 MG tablet Take 1 tablet (5 mg total) by mouth daily. 04/25/23   Cheryle Page, MD  bisacodyl  5 MG EC tablet Take 1 tablet (5 mg total) by mouth daily as needed for moderate constipation. 10/25/23   [name], MD  bisoprolol  (ZEBETA ) 5 MG tablet Take 1 tablet (5 mg total) by mouth daily. 07/20/23   [name], NP  Blood Glucose Monitoring Suppl DEVI 1 each by Does not apply route in the morning, at noon, and at bedtime. May substitute to any manufacturer covered by  patient's insurance. 10/12/23   [name], MD  cefadroxil  (DURICEF) 500 MG capsule Take 500 mg by mouth 2 (two) times daily. 07/14/23   [provider]  Continuous Glucose Sensor (FREESTYLE LIBRE 3 PLUS SENSOR) MISC Change sensor every 15 days. 10/12/23   [name], MD  cyclobenzaprine  (FLEXERIL ) 10 MG tablet Take 1 tablet (10 mg total) by mouth 3 (three) times daily as needed. 03/22/23   [name], MD  FeFum-FePoly-FA-B Cmp-C-Biot (INTEGRA PLUS ) CAPS Take 1 capsule by mouth daily. 06/29/23   Sherrod Sherrod, MD  Glucose Blood (BLOOD GLUCOSE TEST STRIPS) STRP 1 each by In Vitro route in the morning, at noon, and at bedtime. May substitute to any manufacturer covered by patient's insurance. 10/12/23 11/11/23  [name], MD  hydrALAZINE  (APRESOLINE ) 50 MG tablet Take 1 tablet (50 mg total) by mouth every 8 (eight) hours. 04/24/23   Cheryle Page, MD  ipratropium-albuterol  (DUONEB) 0.5-2.5 (3) MG/3ML SOLN Take 3 mLs by nebulization every 4 (four) hours as needed. 04/19/23   [name], MD  isosorbide  mononitrate (IMDUR ) 60 MG 24 hr tablet Take 1 tablet (60 mg total) by mouth daily. 04/25/23   Cheryle Page, MD  Lancet Device MISC 1 each by Does not apply route in the morning, at noon, and at bedtime. May substitute to any manufacturer covered by patient's insurance. 10/12/23 11/11/23  [name], MD  Lancets Misc. MISC 1 each by Does not apply route in the morning, at noon, and at bedtime. May substitute to any manufacturer covered by patient's insurance. 10/12/23 11/11/23  [name], MD  Melatonin 1 MG CAPS Take 1 capsule (1 mg total) by mouth at bedtime. 10/12/23   [name], MD  ondansetron  (ZOFRAN -ODT) 4 MG disintegrating tablet Take 1 tablet (4 mg total) by mouth every 8 (eight) hours as needed for nausea or vomiting. 10/25/23   [name], MD  Oxycodone  HCl 10 MG TABS Take 1 tablet (10 mg total) by mouth every 8 (eight) hours as needed.  10/25/23   [name], MD  pantoprazole  (PROTONIX ) 40 MG tablet Take 1 tablet (40 mg total) by mouth daily. 05/03/23   [name], MD  polyethylene glycol (MIRALAX  / GLYCOLAX ) 17 g packet Take 17 g by mouth daily as needed for moderate constipation. 11/02/22   Cheryle Page, MD  promethazine  (PHENERGAN ) 25 MG suppository Place 1 suppository (25 mg total) rectally every 6 (six) hours as needed for nausea or vomiting. 10/25/23   [name], MD  Respiratory Therapy Supplies (NEBULIZER/TUBING/MOUTHPIECE) KIT 1 each by Does not apply route every 4 (four) hours as needed. 04/19/23   [name], MD  Respiratory Therapy Supplies (NEBULIZER/TUBING/MOUTHPIECE) KIT 1 each by Does not apply route every 4 (four) hours as needed. 04/19/23   [name], MD  rOPINIRole  (REQUIP ) 0.25 MG tablet Take 1 tablet (0.25 mg total) by mouth 3 (three) times daily. 10/12/23   [name], MD  rosuvastatin  (CRESTOR ) 40 MG tablet TAKE 1 TABLET BY MOUTH DAILY. REPLACES ATORVASTATIN  (STOP ATORVASTATIN  IF STILL TAKING) 04/13/23   [name], MD  sacubitril -valsartan  (ENTRESTO ) 24-26 MG Take 1 tablet by mouth 2 (two) times daily. 09/01/23   [name], NP  Semaglutide ,0.25 or 0.5MG /DOS, (OZEMPIC , 0.25 OR 0.5 MG/DOSE,) 2 MG/3ML SOPN Inject 0.25 mg into the skin once a week. 10/25/23   [name], MD  sodium zirconium cyclosilicate  (LOKELMA ) 10 g PACK packet Take 10 g by mouth daily. 09/01/23   [name], NP  SUMAtriptan  (IMITREX ) 50 MG tablet TAKE 1 TABLET (50 MG TOTAL) BY MOUTH DAILY. MAY REPEAT IN 2 HOURS IF HEADACHE PERSISTS OR RECURS. 11/18/22   [name], MD    Allergies: Nubain [nalbuphine hcl]    Review of Systems  Constitutional:  Positive for appetite change. Negative for fever.  Respiratory:  Positive for shortness of breath. Negative for cough.   Cardiovascular:  Negative for chest pain and leg swelling.  Gastrointestinal:  Negative for blood in stool.     Updated Vital Signs BP (!) 127/48   Pulse (!) 56   Temp 97.6 F (36.4 C) (Oral)   Resp 10   SpO2 100%   Physical Exam Vitals and nursing note reviewed.  Constitutional:      Appearance: He is well-developed.  HENT:     Head: Normocephalic and atraumatic.     Mouth/Throat:     Mouth: Mucous membranes are dry.  Cardiovascular:     Rate and Rhythm: Normal rate and regular rhythm.     Heart sounds: Normal heart sounds.  Pulmonary:     Effort: Pulmonary effort is normal.     Breath sounds: Normal breath sounds. No wheezing or rales.  Abdominal:     Palpations: Abdomen is soft.     Tenderness: There is no abdominal tenderness.  Musculoskeletal:     Right lower leg: No edema.     Left lower leg: No edema.  Skin:    General: Skin is warm and dry.  Neurological:     Mental Status: He is alert.     (all labs ordered are listed, but only abnormal results are displayed) Labs Reviewed  COMPREHENSIVE METABOLIC PANEL WITH GFR - Abnormal; Notable for the following components:      Result Value   Sodium 132 (*)    CO2 18 (*)    Glucose, Bld 126 (*)    BUN 48 (*)    Creatinine, Ser 3.00 (*)    GFR, Estimated 21 (*)    Anion gap 16 (*)    All other components within normal limits  CBC WITH DIFFERENTIAL/PLATELET - Abnormal; Notable for the following components:   HCT 38.0 (*)    Platelets 146 (*)    All other components within normal limits  PRO BRAIN NATRIURETIC PEPTIDE - Abnormal; Notable for the following components:   Pro Brain Natriuretic Peptide 634.0 (*)    All other components within normal limits  TROPONIN T, HIGH SENSITIVITY - Abnormal; Notable for the following components:   Troponin T High Sensitivity 67 (*)    All other components within normal limits  RESP PANEL BY RT-PCR (RSV, FLU A&B, COVID)  RVPGX2  CULTURE, BLOOD (ROUTINE X 2)  CULTURE, BLOOD (ROUTINE X 2)  LACTIC ACID, PLASMA  URINALYSIS, W/ REFLEX TO CULTURE (INFECTION SUSPECTED)  TROPONIN T, HIGH  SENSITIVITY    EKG: EKG Interpretation Date/Time:  Wednesday November 01 2023 11:16:07 EDT Ventricular Rate:  62 PR Interval:  188 QRS Duration:  96 QT Interval:  471 QTC Calculation: 479 R Axis:   66  Text Interpretation: Sinus rhythm Consider left atrial enlargement Borderline prolonged QT interval Confirmed by Freddi Hamilton [phone]) on 11/01/2023 11:21:36 AM  Radiology: [facility] Chest 2 View Result Date: 11/01/2023 CLINICAL DATA:  Dyspnea on exertion. EXAM: CHEST - 2 VIEW COMPARISON:  04/24/2023 and 04/20/2023 as well as 11/05/2022 FINDINGS: Lungs are adequately inflated without acute airspace consolidation or effusion. No pneumothorax. Possible small nodular opacity over the right mid to lower lung. Cardiomediastinal silhouette and remainder of the exam is unchanged. IMPRESSION: 1. No acute cardiopulmonary disease. 2. Possible small nodular opacity over the right mid to lower lung. Recommend follow-up chest radiograph 3 months. If persistent, recommend noncontrast chest CT for further evaluation. Electronically Signed   By: Toribio Agreste M.D.   On: 11/01/2023 10:50     Procedures   Medications Ordered in the ED  lactated ringers  bolus 500 mL (0 mLs Intravenous Stopped 11/01/23 1209)                                    Medical Decision Making Amount and/or Complexity of Data Reviewed External Data Reviewed: notes. Labs: ordered.    Details: Acute on chronic kidney injury Radiology: ordered and independent interpretation performed.    Details: No lobar pneumonia ECG/medicine tests: ordered and independent interpretation performed.    Details: Sinus rhythm  Risk Decision regarding hospitalization.   Patient was ambulated and he did minimally desat after ambulation and he felt quite winded.  Vitals have otherwise been reassuring besides some mild bradycardia in the 50s but often in the 60s.  Workup does show an acute on chronic kidney injury.  Clinically he seems to be hypovolemic.   His  proBNP is in the nonspecific range for his age.  No clear sign of an infection.  Does have a nodule that will need eventual follow-up but does not really seem like a pneumonia.  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  At this point, I do not see need for antibiotics.  Discussed case with Dr. Tobie, who accepts for admission.     Final diagnoses:  Acute kidney injury superimposed on chronic kidney disease    ED Discharge Orders     None          Freddi Hamilton, MD 11/01/23 [id number]

## 2023-11-01 NOTE — ED Notes (Signed)
 Pt aware of the need for a urine... Pt currently unable to provide a sample.SABRASABRASABRA

## 2023-11-02 ENCOUNTER — Inpatient Hospital Stay (HOSPITAL_COMMUNITY)

## 2023-11-02 ENCOUNTER — Observation Stay (HOSPITAL_COMMUNITY)

## 2023-11-02 DIAGNOSIS — J439 Emphysema, unspecified: Secondary | ICD-10-CM | POA: Diagnosis not present

## 2023-11-02 DIAGNOSIS — G8918 Other acute postprocedural pain: Secondary | ICD-10-CM | POA: Diagnosis not present

## 2023-11-02 DIAGNOSIS — Z1152 Encounter for screening for COVID-19: Secondary | ICD-10-CM | POA: Diagnosis not present

## 2023-11-02 DIAGNOSIS — J929 Pleural plaque without asbestos: Secondary | ICD-10-CM | POA: Diagnosis not present

## 2023-11-02 DIAGNOSIS — K219 Gastro-esophageal reflux disease without esophagitis: Secondary | ICD-10-CM | POA: Diagnosis not present

## 2023-11-02 DIAGNOSIS — I10 Essential (primary) hypertension: Secondary | ICD-10-CM | POA: Diagnosis not present

## 2023-11-02 DIAGNOSIS — R609 Edema, unspecified: Secondary | ICD-10-CM

## 2023-11-02 DIAGNOSIS — Z01810 Encounter for preprocedural cardiovascular examination: Secondary | ICD-10-CM | POA: Diagnosis not present

## 2023-11-02 DIAGNOSIS — D508 Other iron deficiency anemias: Secondary | ICD-10-CM | POA: Diagnosis not present

## 2023-11-02 DIAGNOSIS — J479 Bronchiectasis, uncomplicated: Secondary | ICD-10-CM | POA: Diagnosis not present

## 2023-11-02 DIAGNOSIS — I5022 Chronic systolic (congestive) heart failure: Secondary | ICD-10-CM | POA: Diagnosis not present

## 2023-11-02 DIAGNOSIS — N189 Chronic kidney disease, unspecified: Secondary | ICD-10-CM | POA: Diagnosis not present

## 2023-11-02 DIAGNOSIS — E872 Acidosis, unspecified: Secondary | ICD-10-CM | POA: Diagnosis not present

## 2023-11-02 DIAGNOSIS — Z720 Tobacco use: Secondary | ICD-10-CM

## 2023-11-02 DIAGNOSIS — I2489 Other forms of acute ischemic heart disease: Secondary | ICD-10-CM | POA: Diagnosis not present

## 2023-11-02 DIAGNOSIS — I771 Stricture of artery: Secondary | ICD-10-CM | POA: Diagnosis not present

## 2023-11-02 DIAGNOSIS — D696 Thrombocytopenia, unspecified: Secondary | ICD-10-CM | POA: Diagnosis not present

## 2023-11-02 DIAGNOSIS — I6522 Occlusion and stenosis of left carotid artery: Secondary | ICD-10-CM | POA: Diagnosis not present

## 2023-11-02 DIAGNOSIS — N400 Enlarged prostate without lower urinary tract symptoms: Secondary | ICD-10-CM | POA: Diagnosis not present

## 2023-11-02 DIAGNOSIS — D509 Iron deficiency anemia, unspecified: Secondary | ICD-10-CM | POA: Diagnosis not present

## 2023-11-02 DIAGNOSIS — H918X3 Other specified hearing loss, bilateral: Secondary | ICD-10-CM | POA: Diagnosis not present

## 2023-11-02 DIAGNOSIS — G4700 Insomnia, unspecified: Secondary | ICD-10-CM | POA: Diagnosis not present

## 2023-11-02 DIAGNOSIS — I130 Hypertensive heart and chronic kidney disease with heart failure and stage 1 through stage 4 chronic kidney disease, or unspecified chronic kidney disease: Secondary | ICD-10-CM | POA: Diagnosis not present

## 2023-11-02 DIAGNOSIS — D62 Acute posthemorrhagic anemia: Secondary | ICD-10-CM | POA: Diagnosis not present

## 2023-11-02 DIAGNOSIS — Z48812 Encounter for surgical aftercare following surgery on the circulatory system: Secondary | ICD-10-CM | POA: Diagnosis not present

## 2023-11-02 DIAGNOSIS — I5033 Acute on chronic diastolic (congestive) heart failure: Secondary | ICD-10-CM | POA: Diagnosis not present

## 2023-11-02 DIAGNOSIS — R5381 Other malaise: Secondary | ICD-10-CM | POA: Diagnosis not present

## 2023-11-02 DIAGNOSIS — Z87891 Personal history of nicotine dependence: Secondary | ICD-10-CM | POA: Diagnosis not present

## 2023-11-02 DIAGNOSIS — Z953 Presence of xenogenic heart valve: Secondary | ICD-10-CM | POA: Diagnosis not present

## 2023-11-02 DIAGNOSIS — I129 Hypertensive chronic kidney disease with stage 1 through stage 4 chronic kidney disease, or unspecified chronic kidney disease: Secondary | ICD-10-CM | POA: Diagnosis not present

## 2023-11-02 DIAGNOSIS — I4891 Unspecified atrial fibrillation: Secondary | ICD-10-CM | POA: Diagnosis not present

## 2023-11-02 DIAGNOSIS — Z7985 Long-term (current) use of injectable non-insulin antidiabetic drugs: Secondary | ICD-10-CM | POA: Diagnosis not present

## 2023-11-02 DIAGNOSIS — I5031 Acute diastolic (congestive) heart failure: Secondary | ICD-10-CM | POA: Diagnosis not present

## 2023-11-02 DIAGNOSIS — D6959 Other secondary thrombocytopenia: Secondary | ICD-10-CM | POA: Diagnosis not present

## 2023-11-02 DIAGNOSIS — I77810 Thoracic aortic ectasia: Secondary | ICD-10-CM | POA: Diagnosis not present

## 2023-11-02 DIAGNOSIS — E1122 Type 2 diabetes mellitus with diabetic chronic kidney disease: Secondary | ICD-10-CM | POA: Diagnosis not present

## 2023-11-02 DIAGNOSIS — K5901 Slow transit constipation: Secondary | ICD-10-CM | POA: Diagnosis not present

## 2023-11-02 DIAGNOSIS — Z4682 Encounter for fitting and adjustment of non-vascular catheter: Secondary | ICD-10-CM | POA: Diagnosis not present

## 2023-11-02 DIAGNOSIS — I517 Cardiomegaly: Secondary | ICD-10-CM | POA: Diagnosis not present

## 2023-11-02 DIAGNOSIS — E1151 Type 2 diabetes mellitus with diabetic peripheral angiopathy without gangrene: Secondary | ICD-10-CM | POA: Diagnosis not present

## 2023-11-02 DIAGNOSIS — G928 Other toxic encephalopathy: Secondary | ICD-10-CM | POA: Diagnosis not present

## 2023-11-02 DIAGNOSIS — J40 Bronchitis, not specified as acute or chronic: Secondary | ICD-10-CM | POA: Diagnosis not present

## 2023-11-02 DIAGNOSIS — J9811 Atelectasis: Secondary | ICD-10-CM | POA: Diagnosis not present

## 2023-11-02 DIAGNOSIS — N179 Acute kidney failure, unspecified: Secondary | ICD-10-CM | POA: Diagnosis not present

## 2023-11-02 DIAGNOSIS — I351 Nonrheumatic aortic (valve) insufficiency: Secondary | ICD-10-CM | POA: Diagnosis not present

## 2023-11-02 DIAGNOSIS — M7989 Other specified soft tissue disorders: Secondary | ICD-10-CM | POA: Diagnosis not present

## 2023-11-02 DIAGNOSIS — Z79899 Other long term (current) drug therapy: Secondary | ICD-10-CM | POA: Diagnosis not present

## 2023-11-02 DIAGNOSIS — E43 Unspecified severe protein-calorie malnutrition: Secondary | ICD-10-CM | POA: Diagnosis not present

## 2023-11-02 DIAGNOSIS — I350 Nonrheumatic aortic (valve) stenosis: Secondary | ICD-10-CM | POA: Diagnosis not present

## 2023-11-02 DIAGNOSIS — K5900 Constipation, unspecified: Secondary | ICD-10-CM | POA: Diagnosis not present

## 2023-11-02 DIAGNOSIS — R0602 Shortness of breath: Secondary | ICD-10-CM | POA: Diagnosis not present

## 2023-11-02 DIAGNOSIS — Z794 Long term (current) use of insulin: Secondary | ICD-10-CM | POA: Diagnosis not present

## 2023-11-02 DIAGNOSIS — M79603 Pain in arm, unspecified: Secondary | ICD-10-CM | POA: Diagnosis not present

## 2023-11-02 DIAGNOSIS — J449 Chronic obstructive pulmonary disease, unspecified: Secondary | ICD-10-CM | POA: Diagnosis not present

## 2023-11-02 DIAGNOSIS — N1832 Chronic kidney disease, stage 3b: Secondary | ICD-10-CM | POA: Diagnosis not present

## 2023-11-02 DIAGNOSIS — E1159 Type 2 diabetes mellitus with other circulatory complications: Secondary | ICD-10-CM | POA: Diagnosis not present

## 2023-11-02 DIAGNOSIS — G479 Sleep disorder, unspecified: Secondary | ICD-10-CM | POA: Diagnosis not present

## 2023-11-02 DIAGNOSIS — I2510 Atherosclerotic heart disease of native coronary artery without angina pectoris: Secondary | ICD-10-CM | POA: Diagnosis not present

## 2023-11-02 DIAGNOSIS — I480 Paroxysmal atrial fibrillation: Secondary | ICD-10-CM | POA: Diagnosis not present

## 2023-11-02 DIAGNOSIS — R414 Neurologic neglect syndrome: Secondary | ICD-10-CM | POA: Diagnosis not present

## 2023-11-02 DIAGNOSIS — Z681 Body mass index (BMI) 19 or less, adult: Secondary | ICD-10-CM | POA: Diagnosis not present

## 2023-11-02 DIAGNOSIS — Y840 Cardiac catheterization as the cause of abnormal reaction of the patient, or of later complication, without mention of misadventure at the time of the procedure: Secondary | ICD-10-CM | POA: Diagnosis not present

## 2023-11-02 DIAGNOSIS — R64 Cachexia: Secondary | ICD-10-CM | POA: Diagnosis not present

## 2023-11-02 DIAGNOSIS — I700 Atherosclerosis of aorta: Secondary | ICD-10-CM | POA: Diagnosis not present

## 2023-11-02 DIAGNOSIS — I352 Nonrheumatic aortic (valve) stenosis with insufficiency: Secondary | ICD-10-CM | POA: Diagnosis not present

## 2023-11-02 DIAGNOSIS — R739 Hyperglycemia, unspecified: Secondary | ICD-10-CM | POA: Diagnosis not present

## 2023-11-02 DIAGNOSIS — R569 Unspecified convulsions: Secondary | ICD-10-CM | POA: Diagnosis not present

## 2023-11-02 DIAGNOSIS — E119 Type 2 diabetes mellitus without complications: Secondary | ICD-10-CM | POA: Diagnosis not present

## 2023-11-02 DIAGNOSIS — K5909 Other constipation: Secondary | ICD-10-CM | POA: Diagnosis not present

## 2023-11-02 DIAGNOSIS — Z8249 Family history of ischemic heart disease and other diseases of the circulatory system: Secondary | ICD-10-CM | POA: Diagnosis not present

## 2023-11-02 DIAGNOSIS — Z452 Encounter for adjustment and management of vascular access device: Secondary | ICD-10-CM | POA: Diagnosis not present

## 2023-11-02 DIAGNOSIS — I358 Other nonrheumatic aortic valve disorders: Secondary | ICD-10-CM | POA: Diagnosis not present

## 2023-11-02 DIAGNOSIS — E871 Hypo-osmolality and hyponatremia: Secondary | ICD-10-CM | POA: Diagnosis not present

## 2023-11-02 DIAGNOSIS — E785 Hyperlipidemia, unspecified: Secondary | ICD-10-CM | POA: Diagnosis not present

## 2023-11-02 DIAGNOSIS — I7121 Aneurysm of the ascending aorta, without rupture: Secondary | ICD-10-CM | POA: Diagnosis not present

## 2023-11-02 DIAGNOSIS — J939 Pneumothorax, unspecified: Secondary | ICD-10-CM | POA: Diagnosis not present

## 2023-11-02 DIAGNOSIS — G2581 Restless legs syndrome: Secondary | ICD-10-CM | POA: Diagnosis not present

## 2023-11-02 DIAGNOSIS — I5032 Chronic diastolic (congestive) heart failure: Secondary | ICD-10-CM | POA: Diagnosis not present

## 2023-11-02 DIAGNOSIS — I739 Peripheral vascular disease, unspecified: Secondary | ICD-10-CM

## 2023-11-02 DIAGNOSIS — I959 Hypotension, unspecified: Secondary | ICD-10-CM | POA: Diagnosis not present

## 2023-11-02 DIAGNOSIS — E559 Vitamin D deficiency, unspecified: Secondary | ICD-10-CM | POA: Diagnosis not present

## 2023-11-02 DIAGNOSIS — Z952 Presence of prosthetic heart valve: Secondary | ICD-10-CM | POA: Diagnosis not present

## 2023-11-02 LAB — CBC WITH DIFFERENTIAL/PLATELET
Abs Immature Granulocytes: 0.04 K/uL (ref 0.00–0.07)
Basophils Absolute: 0 K/uL (ref 0.0–0.1)
Basophils Relative: 0 %
Eosinophils Absolute: 0.1 K/uL (ref 0.0–0.5)
Eosinophils Relative: 1 %
HCT: 36.2 % — ABNORMAL LOW (ref 39.0–52.0)
Hemoglobin: 12.2 g/dL — ABNORMAL LOW (ref 13.0–17.0)
Immature Granulocytes: 1 %
Lymphocytes Relative: 23 %
Lymphs Abs: 1.8 K/uL (ref 0.7–4.0)
MCH: 30.8 pg (ref 26.0–34.0)
MCHC: 33.7 g/dL (ref 30.0–36.0)
MCV: 91.4 fL (ref 80.0–100.0)
Monocytes Absolute: 0.6 K/uL (ref 0.1–1.0)
Monocytes Relative: 8 %
Neutro Abs: 5.3 K/uL (ref 1.7–7.7)
Neutrophils Relative %: 67 %
Platelets: 145 K/uL — ABNORMAL LOW (ref 150–400)
RBC: 3.96 MIL/uL — ABNORMAL LOW (ref 4.22–5.81)
RDW: 13.9 % (ref 11.5–15.5)
WBC: 7.9 K/uL (ref 4.0–10.5)
nRBC: 0 % (ref 0.0–0.2)

## 2023-11-02 LAB — COMPREHENSIVE METABOLIC PANEL WITH GFR
ALT: 12 U/L (ref 0–44)
AST: 21 U/L (ref 15–41)
Albumin: 3.2 g/dL — ABNORMAL LOW (ref 3.5–5.0)
Alkaline Phosphatase: 77 U/L (ref 38–126)
Anion gap: 12 (ref 5–15)
BUN: 51 mg/dL — ABNORMAL HIGH (ref 8–23)
CO2: 15 mmol/L — ABNORMAL LOW (ref 22–32)
Calcium: 8 mg/dL — ABNORMAL LOW (ref 8.9–10.3)
Chloride: 105 mmol/L (ref 98–111)
Creatinine, Ser: 3.07 mg/dL — ABNORMAL HIGH (ref 0.61–1.24)
GFR, Estimated: 20 mL/min — ABNORMAL LOW (ref >60)
Glucose, Bld: 147 mg/dL — ABNORMAL HIGH (ref 70–99)
Potassium: 4.5 mmol/L (ref 3.5–5.1)
Sodium: 132 mmol/L — ABNORMAL LOW (ref 135–145)
Total Bilirubin: 1 mg/dL (ref 0.0–1.2)
Total Protein: 6.2 g/dL — ABNORMAL LOW (ref 6.5–8.1)

## 2023-11-02 LAB — GLUCOSE, CAPILLARY
Glucose-Capillary: 83 mg/dL (ref 70–99)
Glucose-Capillary: 182 mg/dL — ABNORMAL HIGH (ref 70–99)
Glucose-Capillary: 137 mg/dL — ABNORMAL HIGH (ref 70–99)
Glucose-Capillary: 193 mg/dL — ABNORMAL HIGH (ref 70–99)

## 2023-11-02 LAB — PHOSPHORUS: Phosphorus: 4.6 mg/dL (ref 2.5–4.6)

## 2023-11-02 LAB — MAGNESIUM: Magnesium: 2.8 mg/dL — ABNORMAL HIGH (ref 1.7–2.4)

## 2023-11-02 LAB — HEMOGLOBIN A1C
Hgb A1c MFr Bld: 8.7 % — ABNORMAL HIGH (ref 4.8–5.6)
Mean Plasma Glucose: 202.99 mg/dL

## 2023-11-02 MED ORDER — ASPIRIN 81 MG PO TBEC
81.0000 mg | DELAYED_RELEASE_TABLET | Freq: Every day | ORAL | Status: DC
  Administered 2023-11-02 – 2023-11-07 (×5): 81 mg via ORAL
  Filled 2023-11-02 (×5): qty 1

## 2023-11-02 MED ORDER — FUROSEMIDE 10 MG/ML IJ SOLN
40.0000 mg | Freq: Once | INTRAMUSCULAR | Status: AC
  Administered 2023-11-02: 40 mg via INTRAVENOUS
  Filled 2023-11-02: qty 4

## 2023-11-02 NOTE — Consult Note (Addendum)
 [address]       [place] [address]             [phone]        Michael Bender [facility] Medical Record #[id number] Date of Birth: 1945/11/03  Referring: No ref. provider found Primary Care: [name], MD Primary Cardiologist:Paula Okey, MD  Chief Complaint:    Chief Complaint  Patient presents with   Shortness of Breath    History of Present Illness:     Michael Bender is a 78 year old male with a past medical history of hypertension, hyperlipidemia, uncontrolled T2DM (last HgbA1C 8.4), paroxysmal atrial fibrillation not on anticoagulation due to falls, BPH, history of GI bleed due to duodenal ulcer, AKI on CKD stage IIIb, peripheral arterial disease, chronic iron deficiency anemia, prior tobacco abuse quit 5 years ago, COPD, combined systolic and diastolic heart failure, and degenerative spine disease S/P multiple surgeries. CVA is listed in his problem list but patient denies stroke history. The patient presented to the hospital in September 2024 after multiple falls with traumatic rhabdomyolysis and acute kidney failure and then again October 2025 after multiple syncopal episodes. At that time he underwent I&D of the left wrist for septic arthritis and was started on IV antibiotics for MSSA sepsis. 2D echocardiogram on 11/07/22 showed a trileaflet aortic valve with no evidence of valvular abnormality and LVEF was 60-65%. Follow up TTE on 04/21/23 showed LVEF 40-45% with global hypokinesis but still with no valvular abnormality. Cardiac MRI on 06/06/23 showed multiple jets of aortic insufficiency with moderate to severe regurgitation. TEE on 07/24/23 showed LVEF 50-55%, mildly reduced right ventricular systolic function, an oscillating linear density (0.7-0.8cm) on the left coronary cusp of the aortic valve concerning for vegetation with severe aortic insufficiency, mild mitral valve regurgitation, and moderate (grade III) plaque of the descending aorta.  Antibiotics were not started at this time per ID as it was felt the vegetation was related to previous infection. He was seen by Dr. Lucas in our office on 08/30/23 for cardiothoracic surgical consult. At that time he remained active without limitation, denied shortness of breath, fatigue, orthopnea, peripheral edema, and dizziness. Dr. Lucas reported the patient looked frail and chronically ill but felt he was a candidate for aortic valve replacement to prevent progressive left ventricular dysfunction and worsening heart failure symptoms. The patient is not a TAVR candidate with pure aortic insufficiency. The patient decided to hold off on surgery and continued medical therapy and observation.   The patient presented to the ED on 10/01 with complaints of exertional dyspnea since Saturday. He reports he could barely walk 5 feet without getting short of breath and reports overall weakness. He denies chest pain, fever, cough, and peripheral edema. He reports he has not had an appetite since Saturday. His doctor put him on Ozempic  for diabetes but he has not taken that in 2 weeks. The family also reports he has not been eating or drinking much over the last few days. He had an AKI on CKD with creatinine upon arrival 3.0, proBNP 634 and troponin I (high sensitivity) 67>62 most likely due to demand ischemia. EKG was without acute ischemia and showed NSR with a prolonged Qtc. Blood cultures were collected  and show no growth to date. Echocardiogram 11/02/23 read LVEF 60-65% and mild aortic regurgitation but the aortic valve is not well visualized...unsure if correct? Upon ED arrival the patient was still declining surgery but the patient  is now agreeable to surgery if he is a candidate.   The patient becomes hypoxic and extremely dyspneic when walking about 40 feet. He currently lives alone and his sons check on him. He reports he is able to complete his ADLs but has to sit and rest frequently. He continues to look  very frail/cachectic and chronically ill.   Current Activity/ Functional Status: Patient is independent with mobility/ambulation, transfers, ADL's, IADL's.   Zubrod Score: At the time of surgery this patient's most appropriate activity status/level should be described as: []     0    Normal activity, no symptoms []     1    Restricted in physical strenuous activity but ambulatory, able to do out light work [x]     2    Ambulatory and capable of self care, unable to do work activities, up and about                 more than 50%  Of the time                            [x]     3    Only limited self care, in bed greater than 50% of waking hours []     4    Completely disabled, no self care, confined to bed or chair []     5    Moribund  Past Medical History:  Diagnosis Date   Acute combined systolic and diastolic heart failure (HCC) 04/22/2023   Acute on chronic diastolic CHF (congestive heart failure) (HCC) 04/20/2023   AKI (acute kidney injury) 10/28/2022   Arthritis    Back   Balance problems 02/21/2023   Blood transfusion    as a child   Cachexia 05/03/2022   Added based on temporal wasting noted on exam, Body mass index is 19.92 kg/m. At 05/03/2022 encounter     Cervical discitis 11/10/2022   Chronic back pain    COPD (chronic obstructive pulmonary disease) (HCC)    Disturbance of skin sensation 05/22/2018   Effusion of right knee 02/21/2023   Effusion of right knee joint 01/16/2023   Elevated troponin 04/22/2023   Essential hypertension 03/05/2007   Qualifier: Diagnosis of   By: Krystal MD, Reyes LABOR      Finger pain, left 11/08/2022   Gastric erosion    Gastritis and gastroduodenitis    Gastrointestinal hemorrhage with melena 11/20/2018   GIB (gastrointestinal bleeding) 07/18/2019   Hardware complicating wound infection 11/10/2022   Headache(784.0)    last one 6 months ago   Hemorrhoids, internal    High risk medication use 03/03/2022   History of duodenal ulcer    History of  fusion of cervical spine 03/03/2022   With persistent cervical pain and palpable screws  Led to disability  History of attempt to dig furrow in skull to fix it  Last surgery 1992     History of lumbar fusion 03/03/2022   RE-OPERATIVE DIAGNOSIS:  lumbar stenosis synovial cyst lumbar spondylosis spondylolisthesis lumbar radiculoapthy L4/5   PROCEDURE:  Procedure(s): POSTERIOR LUMBAR FUSION 1 LEVEL with resection of synovial cyst     History of upper gastrointestinal bleeding 03/03/2022   Duodenal ulcer 2021 Dr. Avram   HLD (hyperlipidemia)    Hypertension    Hypokalemia 04/22/2023   Hypomagnesemia 04/22/2023   Infected blister of left index finger 11/07/2022   Intractable pain 03/03/2022   Loss of weight    Wt Readings from  Last 10 Encounters:  04/05/22  146 lb (66.2 kg)  03/03/22  143 lb 9.6 oz (65.1 kg)  07/14/21  153 lb (69.4 kg)  12/14/20  147 lb 12.8 oz (67 kg)  11/13/19  154 lb (69.9 kg)  09/16/19  146 lb (66.2 kg)  08/06/19  146 lb (66.2 kg)  07/19/19  144 lb 13.5 oz (65.7 kg)  07/17/19  148 lb (67.1 kg)  07/09/19  147 lb 9.6 oz (67 kg)         MSSA bacteremia 11/07/2022   Neck rigidity    post cervical fusion   Nocturia    PAIN, CHRONIC NEC 10/06/2006   Qualifier: Diagnosis of   By: Krystal RN, Leeroy       Pneumonia    Prostate disease    Right sided temporal headache 05/22/2018   S/P cervical spinal fusion 03/03/2022   With persistent cervical pain and palpable screws Led to disability History of attempt to dig furrow in skull to fix it Last surgery 1992   Senile ecchymosis 01/21/2019   Septic arthritis of wrist, left (HCC) 11/08/2022   Septic infrapatellar bursitis of right knee 11/07/2022   Staphylococcal Arthritis of Right Knee (Updated 01/30/2023) MRI 01/17/2023 shows worsening findings:  Worsening tear of posterior horn medial meniscus with large radial component Worsening subcortical stress fracture/osteochondral lesion of medial femoral condyle New subcortical  stress fracture/osteochondral lesion of medial tibial plateau Moderate-to-large effusion with worsened synovitis    Staphylococcal arthritis of left wrist (HCC) 11/07/2022   Staphylococcal arthritis of right knee (HCC) 11/08/2022   Syncope 11/05/2022   Underweight on examination 05/03/2022    Past Surgical History:  Procedure Laterality Date   BIOPSY  07/19/2019   Procedure: BIOPSY;  Surgeon: San Sandor GAILS, DO;  Location: [facility];  Service: Gastroenterology;;   CERVICAL FUSION  1992   C2/C 3  four surgeries   COLONOSCOPY     ESOPHAGOGASTRODUODENOSCOPY  07/20/2011   Procedure: ESOPHAGOGASTRODUODENOSCOPY (EGD);  Surgeon: Lupita FORBES Commander, MD;  Location: [facility];  Service: Endoscopy;  Laterality: N/A;   ESOPHAGOGASTRODUODENOSCOPY (EGD) WITH PROPOFOL  N/A 07/19/2019   Procedure: ESOPHAGOGASTRODUODENOSCOPY (EGD) WITH PROPOFOL ;  Surgeon: San Sandor GAILS, DO;  Location: [facility];  Service: Gastroenterology;  Laterality: N/A;   I & D EXTREMITY Left 11/09/2022   Procedure: IRRIGATION AND DEBRIDEMENT LEFT WRIST;  Surgeon: Arlinda Buster, MD;  Location: [facility];  Service: Orthopedics;  Laterality: Left;   SAVORY DILATION  07/20/2011   Procedure: SAVORY DILATION;  Surgeon: Lupita FORBES Commander, MD;  Location: [facility];  Service: Endoscopy;  Laterality: N/A;   SPINAL FUSION  05/06/11   TRANSESOPHAGEAL ECHOCARDIOGRAM (CATH LAB) N/A 07/24/2023   Procedure: TRANSESOPHAGEAL ECHOCARDIOGRAM;  Surgeon: Pietro Redell RAMAN, MD;  Location: [facility]AS[facility] CV LAB;  Service: Cardiovascular;  Laterality: N/A;    Social History   Tobacco Use  Smoking Status Former   Current packs/day: 0.00   Types: Cigarettes   Start date: 12/12/1978   Quit date: 12/12/2018   Years since quitting: 4.8  Smokeless Tobacco Never    Social History   Substance and Sexual Activity  Alcohol Use Not Currently   Alcohol/week: 2.0 standard drinks of alcohol   Types: 2 Shots of liquor per week     Allergies  Allergen  Reactions   Nubain [Nalbuphine Hcl]     Muscle contraction    Current Facility-Administered Medications  Medication Dose Route Frequency Provider Last Rate Last Admin   amitriptyline  (ELAVIL ) tablet 50 mg  50  mg Oral QHS Patel, Ekta V, MD   50 mg at 11/01/23 2229   amLODipine  (NORVASC ) tablet 5 mg  5 mg Oral Daily Patel, Ekta V, MD   5 mg at 11/02/23 9075   aspirin EC tablet 81 mg  81 mg Oral Daily Raford Riggs, MD   81 mg at 11/02/23 0935   feeding supplement (ENSURE PLUS HIGH PROTEIN) liquid 237 mL  237 mL Oral BID BM Patel, Ekta V, MD   237 mL at 11/02/23 0935   furosemide  (LASIX ) injection 40 mg  40 mg Intravenous Once Raford Riggs, MD       heparin  injection 5,000 Units  5,000 Units Subcutaneous Q12H Tobie Mario GAILS, MD   5,000 Units at 11/02/23 9075   hydrALAZINE  (APRESOLINE ) tablet 50 mg  50 mg Oral Q8H Patel, Ekta V, MD   50 mg at 11/01/23 1632   insulin  aspart (novoLOG ) injection 0-15 Units  0-15 Units Subcutaneous TID WC Tobie Mario GAILS, MD       ipratropium-albuterol  (DUONEB) 0.5-2.5 (3) MG/3ML nebulizer solution 3 mL  3 mL Nebulization Q4H PRN Tobie Mario GAILS, MD       pantoprazole  (PROTONIX ) EC tablet 40 mg  40 mg Oral Daily Patel, Ekta V, MD   40 mg at 11/02/23 9075   rOPINIRole  (REQUIP ) tablet 0.25 mg  0.25 mg Oral TID Patel, Ekta V, MD   0.25 mg at 11/02/23 1001   rosuvastatin  (CRESTOR ) tablet 40 mg  40 mg Oral Daily Patel, Ekta V, MD   40 mg at 11/02/23 9075   SUMAtriptan  (IMITREX ) tablet 50 mg  50 mg Oral Daily PRN Patel, Ekta V, MD        Medications Prior to Admission  Medication Sig Dispense Refill Last Dose/Taking   amitriptyline  (ELAVIL ) 50 MG tablet Take 1 tablet (50 mg total) by mouth at bedtime. 90 tablet 3 10/31/2023   amLODipine  (NORVASC ) 5 MG tablet Take 1 tablet (5 mg total) by mouth daily. 30 tablet 0 10/31/2023   bisoprolol  (ZEBETA ) 5 MG tablet Take 1 tablet (5 mg total) by mouth daily. 90 tablet 1 10/31/2023   cyclobenzaprine  (FLEXERIL ) 10 MG tablet Take 1  tablet (10 mg total) by mouth 3 (three) times daily as needed. 270 tablet 3 10/31/2023   ipratropium-albuterol  (DUONEB) 0.5-2.5 (3) MG/3ML SOLN Take 3 mLs by nebulization every 4 (four) hours as needed. 90 mL 1 Unknown   Melatonin 1 MG CAPS Take 1 capsule (1 mg total) by mouth at bedtime. 90 capsule 30 10/31/2023   metoprolol  succinate (TOPROL -XL) 25 MG 24 hr tablet Take 25 mg by mouth daily.   10/31/2023   ondansetron  (ZOFRAN -ODT) 4 MG disintegrating tablet Take 1 tablet (4 mg total) by mouth every 8 (eight) hours as needed for nausea or vomiting. 60 tablet 2 Past Month   Oxycodone  HCl 10 MG TABS Take 1 tablet (10 mg total) by mouth every 8 (eight) hours as needed. 90 tablet 0 10/31/2023   pantoprazole  (PROTONIX ) 40 MG tablet Take 1 tablet (40 mg total) by mouth daily. 90 tablet 3 10/31/2023   polyethylene glycol (MIRALAX  / GLYCOLAX ) 17 g packet Take 17 g by mouth daily as needed for moderate constipation. 14 each 0 Past Week   rOPINIRole  (REQUIP ) 0.25 MG tablet Take 1 tablet (0.25 mg total) by mouth 3 (three) times daily. (Patient taking differently: Take 0.25 mg by mouth 3 (three) times daily as needed (restless leg).) 30 tablet 2 10/31/2023   rosuvastatin  (CRESTOR ) 40 MG tablet  TAKE 1 TABLET BY MOUTH DAILY. REPLACES ATORVASTATIN  (STOP ATORVASTATIN  IF STILL TAKING) 90 tablet 3 10/31/2023   sacubitril -valsartan  (ENTRESTO ) 24-26 MG Take 1 tablet by mouth 2 (two) times daily. (Patient taking differently: Take 0.5 tablets by mouth daily.) 180 tablet 3 10/31/2023   Semaglutide ,0.25 or 0.5MG /DOS, (OZEMPIC , 0.25 OR 0.5 MG/DOSE,) 2 MG/3ML SOPN Inject 0.25 mg into the skin once a week. 3 mL 5 10/14/2023   spironolactone  (ALDACTONE ) 25 MG tablet Take 25 mg by mouth daily.   10/31/2023   SUMAtriptan  (IMITREX ) 50 MG tablet TAKE 1 TABLET (50 MG TOTAL) BY MOUTH DAILY. MAY REPEAT IN 2 HOURS IF HEADACHE PERSISTS OR RECURS. 9 tablet 1 Unknown   bisacodyl  5 MG EC tablet Take 1 tablet (5 mg total) by mouth daily as needed for  moderate constipation. (Patient not taking: Reported on 11/02/2023) 30 tablet 0 Not Taking   Blood Glucose Monitoring Suppl DEVI 1 each by Does not apply route in the morning, at noon, and at bedtime. May substitute to any manufacturer covered by patient's insurance. 1 each 0    cefadroxil  (DURICEF) 500 MG capsule Take 500 mg by mouth 2 (two) times daily. (Patient not taking: Reported on 11/02/2023)   Not Taking   Continuous Glucose Sensor (FREESTYLE LIBRE 3 PLUS SENSOR) MISC Change sensor every 15 days. 2 each 11    FeFum-FePoly-FA-B Cmp-C-Biot (INTEGRA PLUS ) CAPS Take 1 capsule by mouth daily. (Patient not taking: Reported on 11/02/2023) 30 capsule 5 Not Taking   Glucose Blood (BLOOD GLUCOSE TEST STRIPS) STRP 1 each by In Vitro route in the morning, at noon, and at bedtime. May substitute to any manufacturer covered by patient's insurance. 100 strip 0    hydrALAZINE  (APRESOLINE ) 50 MG tablet Take 1 tablet (50 mg total) by mouth every 8 (eight) hours. (Patient not taking: Reported on 11/02/2023) 90 tablet 0 Not Taking   isosorbide  mononitrate (IMDUR ) 60 MG 24 hr tablet Take 1 tablet (60 mg total) by mouth daily. (Patient not taking: Reported on 11/02/2023) 30 tablet 0 Not Taking   JARDIANCE  10 MG TABS tablet Take 10 mg by mouth every morning. (Patient not taking: Reported on 11/02/2023)   Not Taking   Lancet Device MISC 1 each by Does not apply route in the morning, at noon, and at bedtime. May substitute to any manufacturer covered by patient's insurance. 1 each 0    Lancets Misc. MISC 1 each by Does not apply route in the morning, at noon, and at bedtime. May substitute to any manufacturer covered by patient's insurance. 100 each 0    predniSONE (DELTASONE) 10 MG tablet Take 10 mg by mouth 3 (three) times daily. (Patient not taking: Reported on 11/02/2023)   Not Taking   promethazine  (PHENERGAN ) 25 MG suppository Place 1 suppository (25 mg total) rectally every 6 (six) hours as needed for nausea or vomiting.  (Patient not taking: Reported on 11/02/2023) 12 each 0 Not Taking   Respiratory Therapy Supplies (NEBULIZER/TUBING/MOUTHPIECE) KIT 1 each by Does not apply route every 4 (four) hours as needed. 1 kit 1    Respiratory Therapy Supplies (NEBULIZER/TUBING/MOUTHPIECE) KIT 1 each by Does not apply route every 4 (four) hours as needed. 1 kit 1    sodium zirconium cyclosilicate  (LOKELMA ) 10 g PACK packet Take 10 g by mouth daily. (Patient not taking: Reported on 11/02/2023) 7 packet 0 Not Taking    Family History  Problem Relation Age of Onset   Heart disease Mother    Heart attack Mother  57   Pancreatic cancer Father 70   Pancreatic cancer Brother    Anesthesia problems Neg Hx    Colon cancer Neg Hx    Liver cancer Neg Hx    Stomach cancer Neg Hx    Esophageal cancer Neg Hx    Rectal cancer Neg Hx      Review of Systems:  Review of Systems  Constitutional:  Positive for malaise/fatigue. Negative for diaphoresis, fever and weight loss.  HENT:  Negative for hearing loss.   Eyes:  Negative for blurred vision.  Respiratory:  Positive for shortness of breath. Negative for cough, sputum production and wheezing.   Cardiovascular:  Negative for chest pain, palpitations, orthopnea and leg swelling.  Gastrointestinal:  Positive for constipation. Negative for abdominal pain, diarrhea, nausea and vomiting.  Genitourinary:  Negative for dysuria.  Musculoskeletal:  Negative for falls.  Neurological:  Positive for weakness. Negative for dizziness, focal weakness and loss of consciousness.  Endo/Heme/Allergies:  Does not bruise/bleed easily.  Psychiatric/Behavioral:  Negative for depression. The patient is not nervous/anxious.   Dental: Has dentures, does not go to the dentist  Physical Exam: BP (!) 131/49 (BP Location: Left Arm)   Pulse 65   Temp (!) 97.5 F (36.4 C)   Resp 20   SpO2 100%   General appearance: alert, no distress, and chronically ill, cachectic Head: Normocephalic, without  obvious abnormality, atraumatic Neck: no adenopathy, no carotid bruit, no JVD, supple, symmetrical, trachea midline, and thyroid  not enlarged, symmetric, no tenderness/mass/nodules Lymph nodes: Cervical, supraclavicular, and axillary nodes normal. Resp: clear to auscultation bilaterally Cardio: regular rate and rhythm, systolic and diastolic murmur LLSB GI: soft, non-tender; bowel sounds normal; no masses,  no organomegaly Extremities: extremities normal, atraumatic, no cyanosis or edema. 2+ radial pulses bilaterally, 2+ DP/PT pulses bilaterally. No venous stasis changes or varicose veins Neurologic: Grossly normal Dental: Dentures in place, no sign of dental infection   Recent Radiology Findings:   VAS US  LOWER EXTREMITY VENOUS (DVT) Result Date: 11/02/2023  Lower Venous DVT Study Patient Name:  KAELEM BRACH  Date of Exam:   11/02/2023 Medical Rec #: [id number]         Accession #:    [id number] Date of Birth: 07-Jan-1946        Patient Gender: M Patient Age:   2 years Exam Location:  [facility] Procedure:      VAS US  LOWER EXTREMITY VENOUS (DVT) Referring Phys: EKTA PATEL --------------------------------------------------------------------------------  Indications: SOB, and Edema (Leg size one is larger than other).  Comparison Study: No previous exams Performing Technologist: Jody Hill RVT, RDMS  Examination Guidelines: A complete evaluation includes B-mode imaging, spectral Doppler, color Doppler, and power Doppler as needed of all accessible portions of each vessel. Bilateral testing is considered an integral part of a complete examination. Limited examinations for reoccurring indications may be performed as noted. The reflux portion of the exam is performed with the patient in reverse Trendelenburg.  +---------+---------------+---------+-----------+----------+--------------+ RIGHT    CompressibilityPhasicitySpontaneityPropertiesThrombus Aging  +---------+---------------+---------+-----------+----------+--------------+ CFV      Full           Yes      Yes                                 +---------+---------------+---------+-----------+----------+--------------+ SFJ      Full                                                        +---------+---------------+---------+-----------+----------+--------------+  FV Prox  Full           Yes      Yes                                 +---------+---------------+---------+-----------+----------+--------------+ FV Mid   Full           Yes      Yes                                 +---------+---------------+---------+-----------+----------+--------------+ FV DistalFull           Yes      Yes                                 +---------+---------------+---------+-----------+----------+--------------+ PFV      Full                                                        +---------+---------------+---------+-----------+----------+--------------+ POP      Full           Yes      Yes                                 +---------+---------------+---------+-----------+----------+--------------+ PTV      Full                                                        +---------+---------------+---------+-----------+----------+--------------+ PERO     Full                                                        +---------+---------------+---------+-----------+----------+--------------+   +---------+---------------+---------+-----------+----------+--------------+ LEFT     CompressibilityPhasicitySpontaneityPropertiesThrombus Aging +---------+---------------+---------+-----------+----------+--------------+ CFV      Full           Yes      Yes                                 +---------+---------------+---------+-----------+----------+--------------+ SFJ      Full                                                         +---------+---------------+---------+-----------+----------+--------------+ FV Prox  Full           Yes      Yes                                 +---------+---------------+---------+-----------+----------+--------------+ FV Mid   Full  Yes      Yes                                 +---------+---------------+---------+-----------+----------+--------------+ FV DistalFull           Yes      Yes                                 +---------+---------------+---------+-----------+----------+--------------+ PFV      Full                                                        +---------+---------------+---------+-----------+----------+--------------+ POP      Full           Yes      Yes                                 +---------+---------------+---------+-----------+----------+--------------+ PTV      Full                                                        +---------+---------------+---------+-----------+----------+--------------+ PERO     Full                                                        +---------+---------------+---------+-----------+----------+--------------+     Summary: BILATERAL: - No evidence of deep vein thrombosis seen in the lower extremities, bilaterally. -No evidence of popliteal cyst, bilaterally.   *See table(s) above for measurements and observations.    Preliminary    DG Chest 2 View Result Date: 11/01/2023 CLINICAL DATA:  Dyspnea on exertion. EXAM: CHEST - 2 VIEW COMPARISON:  04/24/2023 and 04/20/2023 as well as 11/05/2022 FINDINGS: Lungs are adequately inflated without acute airspace consolidation or effusion. No pneumothorax. Possible small nodular opacity over the right mid to lower lung. Cardiomediastinal silhouette and remainder of the exam is unchanged. IMPRESSION: 1. No acute cardiopulmonary disease. 2. Possible small nodular opacity over the right mid to lower lung. Recommend follow-up chest radiograph 3 months. If persistent,  recommend noncontrast chest CT for further evaluation. Electronically Signed   By: Toribio Agreste M.D.   On: 11/01/2023 10:50     I have independently reviewed the above radiologic studies and discussed with the patient   Recent Lab Findings: Lab Results  Component Value Date   WBC 7.9 11/02/2023   HGB 12.2 (L) 11/02/2023   HCT 36.2 (L) 11/02/2023   PLT 145 (L) 11/02/2023   GLUCOSE 147 (H) 11/02/2023   CHOL 93 08/14/2023   TRIG 58.0 08/14/2023   HDL 46.00 08/14/2023   LDLDIRECT 102.0 09/08/2015   LDLCALC 36 08/14/2023   ALT 12 11/02/2023   AST 21 11/02/2023   NA 132 (L) 11/02/2023   K 4.5 11/02/2023   CL 105 11/02/2023   CREATININE 3.07 (H) 11/02/2023  BUN 51 (H) 11/02/2023   CO2 15 (L) 11/02/2023   TSH 2.810 06/29/2023   INR 1.1 07/19/2019   HGBA1C 8.4 (H) 10/12/2023    Assessment / Plan:   Severe AI with previous MSSA endocarditis: Patient was seen in our office in July with severe AI and aortic valve vegetation but without symptoms and declined surgery at that time. He now seems to have decompensated and has severe dyspnea with minimal exertion. Echocardiogram shows only mild AI but with poor images, may need an updated TEE. Patient would benefit from surgical aortic valve replacement but remains frail and high risk due to comorbidities. Surgeon to ultimately determine surgical candidacy. Patient will require LHC as part of surgical workup.  HFmrEF: LVEF improved on most recent echo, cardiology following for medical optimization HTN: Continue Norvasc  and Hydralazine  HLD: Continue Rosuvastatin  AKI on CKD stage IIIb: Cr now 3.07, management per primary team. Will significantly increase risk Paroxysmal atrial fibrillation not on anticoagulation due to risk of falls: Denies further atrial fibrillation since diagnosis about 1 year ago during admission Qtc prolongation Previous tobacco abuse Orthostatic hypotension Uncontrolled T2DM: Last Hgb A1C 8.4. Patient's son reports he  has been eating ice cream to keep weight on and stopped taking Ozempic  2 weeks ago due to loss of appetite Hx of GI bleed in 2021 Chronic iron deficiency anemia PAD  I  spent 30 minutes counseling the patient face to face.  Con GORMAN Bend, PA-C 11/02/2023 1:01 PM  Michael Bender is a pleasant 78 year old man with history of AKI on CKD (Cre 3.07 today), pAF (although doesn't recall having a dx of Afib), poorly controlled DM (A1c 8.7%), HTN, HL and HFrEF who presented with shortness of breath for the last two weeks.  He had MSSA bacteremia + septic arthritis in Oct 2024, then found to have severe AI on MRI in May 2025 and then TEE in June 2025.  He was seen by Dr. Lucas in July, who recommended AVR, but at that time, the patient was asymptomatic so he declined surgery.  He says for the last couple weeks his dyspnea has gotten much worse.  He lives alone in a house, watches TV most of the day but was previously walking 2-3x/day for 0.25 miles at a time.  Now he can barely walk 40 ft at a time without getting winded.  He denies chest pain and syncope.  He has an AKI on CKD but actually looks pretty euvolemic on exam.    He has undergone non-contrast chest CT which shows heavy calcium  at the arch but mild calcium  in the ascending aorta and root.  His coronaries look pretty calcified on CT.  He has some ascending aorta dilation - 4.1 cm on my measurement, but it's been reported up to 4.3 cm on previous imaging.  Would not plan to intervene on the aorta.  Needs L/RHC prior to surgery and PFTs.  I have asked cardiology to work on optimizing him over the weekend and try to get his creatinine back down (baseline 1.8-2.2).  In the meantime, we will plan for AVR + LAAL on Monday, 10/6.  I discussed the general nature of the procedure, including the need for general anesthesia, the incisions to be used, the use of cardiopulmonary bypass, and the use of temporary pacemaker wires and drainage tubes  postoperatively with Michael Bender.  We discussed the expected hospital stay, overall recovery and short and long term outcomes. I informed him of the indications, risks, benefits  and alternatives.   He understands the risks include, but are not limited to death, stroke, MI, DVT/PE, bleeding, possible need for transfusion, infections, cardiac arrhythmias, as well as other organ system dysfunction including respiratory (eg: prolonged ventilation), renal, or GI complications.   The pros and cons of a biological vs mechanical valve were discussed.  After joint discussion between the patient and myself, we have decided on implantation of a biologic valve.  Patient is in agreement to proceed with surgery.  Plan: Proceed with AVR and LAAL on 10/6.   Con Clunes, MD Cardiothoracic Surgery Pager: [phone]

## 2023-11-02 NOTE — Progress Notes (Addendum)
 Rounding Note   Patient Name: Michael Bender Date of Encounter: 11/02/2023  [facility] Cardiologist: Vina Gull, MD   Subjective Feeling well at rest.  Very short of breath with minimal exertion.   Scheduled Meds:  amitriptyline   50 mg Oral QHS   amLODipine   5 mg Oral Daily   aspirin EC  81 mg Oral Daily   feeding supplement  237 mL Oral BID BM   heparin  injection (subcutaneous)  5,000 Units Subcutaneous Q12H   hydrALAZINE   50 mg Oral Q8H   insulin  aspart  0-15 Units Subcutaneous TID WC   pantoprazole   40 mg Oral Daily   rOPINIRole   0.25 mg Oral TID   rosuvastatin   40 mg Oral Daily   Continuous Infusions:  PRN Meds: ipratropium-albuterol , SUMAtriptan    Vital Signs  Vitals:   11/01/23 2152 11/02/23 0149 11/02/23 0503 11/02/23 0833  BP: (!) 123/55 (!) 124/50 122/64 (!) 131/49  Pulse: 66 60 60 65  Resp:    20  Temp: 98 F (36.7 C) 98.3 F (36.8 C) 97.8 F (36.6 C) (!) 97.5 F (36.4 C)  TempSrc:      SpO2: 99% 94% 99% 100%    Intake/Output Summary (Last 24 hours) at 11/02/2023 1051 Last data filed at 11/02/2023 0600 Gross per 24 hour  Intake 500 ml  Output 600 ml  Net -100 ml      10/12/2023   11:06 AM 10/10/2023    9:00 AM 09/25/2023   12:50 PM  Last 3 Weights  Weight (lbs) 153 lb 3.2 oz 153 lb 156 lb 12.8 oz  Weight (kg) 69.491 kg 69.4 kg 71.124 kg      Telemetry Sinus rhythm. - Personally Reviewed  ECG  Sinus rhythm.  Rate 62 bpm. - Personally Reviewed  Physical Exam  VS:  BP (!) 131/49 (BP Location: Left Arm)   Pulse 65   Temp (!) 97.5 F (36.4 C)   Resp 20   SpO2 100%  , BMI There is no height or weight on file to calculate BMI. GENERAL:  Well appearing HEENT: Pupils equal round and reactive, fundi not visualized, oral mucosa unremarkable NECK:  No jugular venous distention, waveform within normal limits, carotid upstroke brisk and symmetric, no bruits, no thyromegaly LUNGS:  Clear to auscultation bilaterally HEART:  RRR.  PMI  not displaced or sustained,S1 and S2 within normal limits, no S3, no S4, no clicks, no rubs, II/IV diastolic murmur ABD:  Flat, positive bowel sounds normal in frequency in pitch, no bruits, no rebound, no guarding, no midline pulsatile mass, no hepatomegaly, no splenomegaly EXT:  2 plus pulses throughout, no edema, no cyanosis no clubbing SKIN:  No rashes no nodules NEURO:  Cranial nerves II through XII grossly intact, motor grossly intact throughout PSYCH:  Cognitively intact, oriented to person place and time   Labs High Sensitivity Troponin:  No results for input(s): TROPONINIHS in the last 720 hours.   Chemistry Recent Labs  Lab 11/01/23 1109  NA 132*  K 4.7  CL 99  CO2 18*  GLUCOSE 126*  BUN 48*  CREATININE 3.00*  CALCIUM  9.2  PROT 6.9  ALBUMIN 3.7  AST 29  ALT 10  ALKPHOS 101  BILITOT 0.5  GFRNONAA 21*  ANIONGAP 16*    Lipids No results for input(s): CHOL, TRIG, HDL, LABVLDL, LDLCALC, CHOLHDL in the last 168 hours.  Hematology Recent Labs  Lab 11/01/23 1109 11/02/23 0515  WBC 8.4 7.9  RBC 4.22 3.96*  HGB 13.1  12.2*  HCT 38.0* 36.2*  MCV 90.0 91.4  MCH 31.0 30.8  MCHC 34.5 33.7  RDW 13.5 13.9  PLT 146* 145*   Thyroid  No results for input(s): TSH, FREET4 in the last 168 hours.  BNP Recent Labs  Lab 11/01/23 1109  PROBNP 634.0*    DDimer No results for input(s): DDIMER in the last 168 hours.   Radiology  DG Chest 2 View Result Date: 11/01/2023 CLINICAL DATA:  Dyspnea on exertion. EXAM: CHEST - 2 VIEW COMPARISON:  04/24/2023 and 04/20/2023 as well as 11/05/2022 FINDINGS: Lungs are adequately inflated without acute airspace consolidation or effusion. No pneumothorax. Possible small nodular opacity over the right mid to lower lung. Cardiomediastinal silhouette and remainder of the exam is unchanged. IMPRESSION: 1. No acute cardiopulmonary disease. 2. Possible small nodular opacity over the right mid to lower lung. Recommend follow-up  chest radiograph 3 months. If persistent, recommend noncontrast chest CT for further evaluation. Electronically Signed   By: Toribio Agreste M.D.   On: 11/01/2023 10:50    Cardiac Studies TEE 07/24/23: 1. Oscillating, linear density (0.7-0.8 cm) on left coronary cusp of  aortic valve concerning for vegetation; severe AI (best seen on  transgastric views).   2. Left ventricular ejection fraction, by estimation, is 50 to 55%. The  left ventricle has low normal function. The left ventricle has no regional  wall motion abnormalities. The left ventricular internal cavity size was  mildly dilated.   3. Right ventricular systolic function is mildly reduced. The right  ventricular size is normal.   4. Left atrial size was moderately dilated. No left atrial/left atrial  appendage thrombus was detected.   5. The mitral valve is normal in structure. Mild mitral valve  regurgitation.   6. The aortic valve is tricuspid. Aortic valve regurgitation is severe.   7. There is Moderate (Grade III) plaque involving the descending aorta.   8. 3D performed of the aortic valve.   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     Assessment & Plan   # Severe AR:  Patient admitted with shortness of breath but reports feeling at baseline at this time.  BNP 634.  He tried to go to the side of the bed to urinate and became dyspneic.  We discussed the fact that severe AR cannot be medically managed.  He is open to the idea of surgical valve replacement if he is still a candidate.  Though BNP is elevated, he doesn't appear volume overloaded.  Will give one dose of IV lasix  to see if it helps.  Monitor renal function closely.  Beta blocker was discontinued to avoid bradycardia and worse regurgitation. Echo pending.   # Elevated troponin:  Hs troponin 67-->62. He denies chest pain.  This is  most consistent with demand ischemia.   # PAD: # CAD:  # Hyperlipidemia:  Continue medical management.  Resume aspirin and statin.     # Hypertension:  Continue amlodipine .  Will hold bisoprolol  due to severe AR.  His heart rate is low and this will increase the aortic regurgitation.     For questions or updates, please contact [facility] Please consult [email or web address] for contact info under      Signed, Annabella Scarce, MD  11/02/2023, 10:51 AM     Addendum:  Patient has been evaulated by CT surgery.  Consider for surgery on  Monday.  Will arrange for [facility] tomorrow pending improvement in renal function.  Informed Consent   Shared Decision Making/Informed Consent The risks [stroke (1 in 1000), death (1 in 1000), kidney failure [usually temporary] (1 in 500), bleeding (1 in 200), allergic reaction [possibly serious] (1 in 200)], benefits (diagnostic support and management of coronary artery disease) and alternatives of a cardiac catheterization were discussed in detail with Michael Bender and he is willing to proceed.  Geoffrey Hynes C. Raford, MD, [facility] 11/02/2023 5:34 PM

## 2023-11-02 NOTE — Progress Notes (Signed)
  Echocardiogram 2D Echocardiogram has been performed.  Juliene JINNY Rucks 11/02/2023, 12:59 PM

## 2023-11-02 NOTE — Progress Notes (Signed)
 Patient tentatively placed on cath board for [facility] tomorrow per Dr. Skeeter request - she indicated plan to hold off formal pre-cath orders or fluids pending reassessment of patient's status and creatinine tomorrow. She recommended to keep patient NPO after MN which has been ordered. I sent msg to cardmaster/pre-cath RN inbox as FYI to review final plan for AM.

## 2023-11-02 NOTE — Hospital Course (Addendum)
 78 y.o. 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   Assessment and Plan:   Severe aortic insufficiency/Hx MSSA endocarditis/dyspnea on exertion - Previous evaluation by CT surgery 7/30 with recommendations for surgical correction.  Patient is not a candidate for TAVR.  Patient previously wanted to pursue conservative management however ongoing dyspnea leading to reconsideration of surgical correction.  Evaluated by CT surgery.  Plans to pursue valve replacement early next week.  Cardiac cath to be performed prior to surgical intervention, likely later today.    Acute kidney injury on CKD 3b - Creatinine slightly improved from yesterday, however now with resolved acidemia.  Responded well to IV fluids the last couple days.  Will continue to monitor urine output and repeat bmp in a.m. ideally will have kidney function improve as to not delay impending cardiac cath   Chronic HFrEF -Does not appear to be volume overloaded.  No edema appreciated.  No hypoxia.  Cardiology consulted and following closely.   Elevated troponin - Likely demand ischemia.  Troponins minimally elevated and flat.   Atrial fibrillation - Avoiding beta-blocker.  Not on AC despite CHA2DS2-VASc = 6.   CAD/PAD/HLD - Aspirin and statin on board.   Hypertension - Amlodipine  on board.  Holding beta-blockers.   Diabetes mellitus -insulin  sliding scale on board.   Physical debilitation muscle weakness - Patient is very frail, limited by aortic insufficiency as above.  Will hold off on PT eval secondary to decision by CT surgery.

## 2023-11-02 NOTE — Plan of Care (Signed)

## 2023-11-02 NOTE — Progress Notes (Signed)
 Progress Note   Patient: Michael Bender FMW:[id number] DOB: Apr 24, 1945 DOA: 11/01/2023  DOS: the patient was seen and examined on 11/02/2023   Brief hospital course:  78 y.o. 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  Assessment and Plan:  Severe aortic insufficiency/Hx MSSA endocarditis/dyspnea on exertion - Previous evaluation by CT surgery 7/30 with recommendations for surgical correction.  Patient is not a candidate for TAVR.  Patient previously wanted to pursue conservative management however ongoing dyspnea leading to reconsideration of surgical correction.  Will reach out to CT surgery.  Chronic HFrEF -Does not appear to be volume overloaded.  No edema appreciated.  No hypoxia.  Cardiology consulted and following closely.  Elevated troponin - Likely demand ischemia.  Troponins minimally elevated and flat.  Atrial fibrillation - Avoiding beta-blocker.  Not on AC despite CHA2DS2-VASc = 6.  CAD/PAD/HLD - Aspirin and statin on board.  Hypertension - Amlodipine  on board.  Holding beta-blockers.  Diabetes mellitus -insulin  sliding scale on board.  Acute kidney injury on CKD 3b -Creatinine elevated above basline.  Likely multifactorial etiology.  Will continue to monitor urine output and repeat bmp.    Physical debilitation muscle weakness - Patient is very frail, limited by aortic insufficiency as above.  Will hold off on PT eval secondary to decision by CT surgery.   Subjective: Patient sitting up at the bedside, states he feels moderately improved.  Still having shortness of breath on exertion.  Got dyspneic when attempting to stand up to urinate.  Denies any fever, cough, purulent sputum, nausea, vomiting, lower extremity edema, vomiting.  Physical Exam:  Vitals:    11/01/23 2152 11/02/23 0149 11/02/23 0503 11/02/23 0833  BP: (!) 123/55 (!) 124/50 122/64 (!) 131/49  Pulse: 66 60 60 65  Resp:    20  Temp: 98 F (36.7 C) 98.3 F (36.8 C) 97.8 F (36.6 C) (!) 97.5 F (36.4 C)  TempSrc:      SpO2: 99% 94% 99% 100%    GENERAL:  Alert, pleasant, no acute distress  HEENT:  EOMI CARDIOVASCULAR:  RRR, murmur appreciated RESPIRATORY:  Clear to auscultation, no wheezing, rales, or rhonchi GASTROINTESTINAL:  Soft, nontender, nondistended EXTREMITIES:  No LE edema bilaterally NEURO:  No new focal deficits appreciated SKIN:  No rashes noted PSYCH:  Appropriate mood and affect     Data Reviewed:  Imaging Studies: VAS US  LOWER EXTREMITY VENOUS (DVT) Result Date: 11/02/2023  Lower Venous DVT Study Patient Name:  Michael Bender  Date of Exam:   11/02/2023 Medical Rec #: [id number]         Accession #:    [id number] Date of Birth: 26-Jul-1945        Patient Gender: M Patient Age:   43 years Exam Location:  [facility] Procedure:      VAS US  LOWER EXTREMITY VENOUS (DVT) Referring Phys: EKTA PATEL --------------------------------------------------------------------------------  Indications: SOB, and Edema (Leg size one is larger than other).  Comparison Study: No previous exams Performing Technologist: Jody Hill RVT, RDMS  Examination Guidelines: A complete evaluation includes B-mode imaging, spectral Doppler, color Doppler, and power Doppler as needed of all accessible portions of each vessel. Bilateral testing is considered an integral part of a complete examination. Limited examinations for reoccurring indications may be performed as noted. The reflux portion of the exam is performed with the patient in reverse Trendelenburg.  +---------+---------------+---------+-----------+----------+--------------+  RIGHT    CompressibilityPhasicitySpontaneityPropertiesThrombus Aging +---------+---------------+---------+-----------+----------+--------------+ CFV       Full           Yes      Yes                                 +---------+---------------+---------+-----------+----------+--------------+ SFJ      Full                                                        +---------+---------------+---------+-----------+----------+--------------+ FV Prox  Full           Yes      Yes                                 +---------+---------------+---------+-----------+----------+--------------+ FV Mid   Full           Yes      Yes                                 +---------+---------------+---------+-----------+----------+--------------+ FV DistalFull           Yes      Yes                                 +---------+---------------+---------+-----------+----------+--------------+ PFV      Full                                                        +---------+---------------+---------+-----------+----------+--------------+ POP      Full           Yes      Yes                                 +---------+---------------+---------+-----------+----------+--------------+ PTV      Full                                                        +---------+---------------+---------+-----------+----------+--------------+ PERO     Full                                                        +---------+---------------+---------+-----------+----------+--------------+   +---------+---------------+---------+-----------+----------+--------------+ LEFT     CompressibilityPhasicitySpontaneityPropertiesThrombus Aging +---------+---------------+---------+-----------+----------+--------------+ CFV      Full           Yes      Yes                                 +---------+---------------+---------+-----------+----------+--------------+  SFJ      Full                                                        +---------+---------------+---------+-----------+----------+--------------+ FV Prox  Full           Yes      Yes                                  +---------+---------------+---------+-----------+----------+--------------+ FV Mid   Full           Yes      Yes                                 +---------+---------------+---------+-----------+----------+--------------+ FV DistalFull           Yes      Yes                                 +---------+---------------+---------+-----------+----------+--------------+ PFV      Full                                                        +---------+---------------+---------+-----------+----------+--------------+ POP      Full           Yes      Yes                                 +---------+---------------+---------+-----------+----------+--------------+ PTV      Full                                                        +---------+---------------+---------+-----------+----------+--------------+ PERO     Full                                                        +---------+---------------+---------+-----------+----------+--------------+     Summary: BILATERAL: - No evidence of deep vein thrombosis seen in the lower extremities, bilaterally. -No evidence of popliteal cyst, bilaterally.   *See table(s) above for measurements and observations.    Preliminary    DG Chest 2 View Result Date: 11/01/2023 CLINICAL DATA:  Dyspnea on exertion. EXAM: CHEST - 2 VIEW COMPARISON:  04/24/2023 and 04/20/2023 as well as 11/05/2022 FINDINGS: Lungs are adequately inflated without acute airspace consolidation or effusion. No pneumothorax. Possible small nodular opacity over the right mid to lower lung. Cardiomediastinal silhouette and remainder of the exam is unchanged. IMPRESSION: 1. No acute cardiopulmonary disease. 2. Possible small nodular opacity over the right mid to lower lung. Recommend follow-up chest radiograph 3 months. If persistent, recommend noncontrast chest  CT for further evaluation. Electronically Signed   By: Toribio Agreste M.D.   On: 11/01/2023 10:50     Results are pending, will review when available.  Previous records (including but not limited to H&P, progress notes, nursing notes, TOC management) were reviewed in assessment of this patient.  Labs: CBC: Recent Labs  Lab 11/01/23 1109 11/02/23 0515  WBC 8.4 7.9  NEUTROABS 5.9 5.3  HGB 13.1 12.2*  HCT 38.0* 36.2*  MCV 90.0 91.4  PLT 146* 145*   Basic Metabolic Panel: Recent Labs  Lab 11/01/23 1109 11/02/23 1023  NA 132* 132*  K 4.7 4.5  CL 99 105  CO2 18* 15*  GLUCOSE 126* 147*  BUN 48* 51*  CREATININE 3.00* 3.07*  CALCIUM  9.2 8.0*  MG  --  2.8*  PHOS  --  4.6   Liver Function Tests: Recent Labs  Lab 11/01/23 1109 11/02/23 1023  AST 29 21  ALT 10 12  ALKPHOS 101 77  BILITOT 0.5 1.0  PROT 6.9 6.2*  ALBUMIN 3.7 3.2*   CBG: Recent Labs  Lab 11/01/23 1534 11/01/23 2152 11/02/23 0828  GLUCAP 81 131* 83    Scheduled Meds:  amitriptyline   50 mg Oral QHS   amLODipine   5 mg Oral Daily   aspirin EC  81 mg Oral Daily   feeding supplement  237 mL Oral BID BM   furosemide   40 mg Intravenous Once   heparin  injection (subcutaneous)  5,000 Units Subcutaneous Q12H   hydrALAZINE   50 mg Oral Q8H   insulin  aspart  0-15 Units Subcutaneous TID WC   pantoprazole   40 mg Oral Daily   rOPINIRole   0.25 mg Oral TID   rosuvastatin   40 mg Oral Daily   Continuous Infusions: PRN Meds:.ipratropium-albuterol , SUMAtriptan   Family Communication: None at bedside  Disposition: Status is: Inpatient Remains inpatient appropriate because: AKI, aortic insufficiency     Time spent: 52 minutes  Length of inpatient stay: 0 days  Author: Carliss LELON Canales, DO 11/02/2023 12:43 PM  For on call review [email or web address].

## 2023-11-02 NOTE — Plan of Care (Signed)
  Problem: Education: Goal: Knowledge of General Education information will improve Description: Including pain rating scale, medication(s)/side effects and non-pharmacologic comfort measures Outcome: Progressing   Problem: Health Behavior/Discharge Planning: Goal: Ability to manage health-related needs will improve Outcome: Progressing   Problem: Pain Managment: Goal: General experience of comfort will improve and/or be controlled Outcome: Progressing   Problem: Coping: Goal: Level of anxiety will decrease Outcome: Not Progressing

## 2023-11-03 ENCOUNTER — Other Ambulatory Visit (HOSPITAL_COMMUNITY)

## 2023-11-03 ENCOUNTER — Inpatient Hospital Stay (HOSPITAL_COMMUNITY)

## 2023-11-03 DIAGNOSIS — N189 Chronic kidney disease, unspecified: Secondary | ICD-10-CM | POA: Diagnosis not present

## 2023-11-03 DIAGNOSIS — I351 Nonrheumatic aortic (valve) insufficiency: Secondary | ICD-10-CM | POA: Diagnosis not present

## 2023-11-03 DIAGNOSIS — N179 Acute kidney failure, unspecified: Secondary | ICD-10-CM | POA: Diagnosis not present

## 2023-11-03 DIAGNOSIS — R0602 Shortness of breath: Secondary | ICD-10-CM | POA: Diagnosis not present

## 2023-11-03 LAB — ECHOCARDIOGRAM COMPLETE
Area-P 1/2: 2.83 cm2
S' Lateral: 2.7 cm

## 2023-11-03 LAB — COMPREHENSIVE METABOLIC PANEL WITH GFR
ALT: 11 U/L (ref 0–44)
AST: 19 U/L (ref 15–41)
Albumin: 3.2 g/dL — ABNORMAL LOW (ref 3.5–5.0)
Alkaline Phosphatase: 70 U/L (ref 38–126)
Anion gap: 14 (ref 5–15)
BUN: 57 mg/dL — ABNORMAL HIGH (ref 8–23)
CO2: 17 mmol/L — ABNORMAL LOW (ref 22–32)
Calcium: 8.3 mg/dL — ABNORMAL LOW (ref 8.9–10.3)
Chloride: 106 mmol/L (ref 98–111)
Creatinine, Ser: 3.28 mg/dL — ABNORMAL HIGH (ref 0.61–1.24)
GFR, Estimated: 19 mL/min — ABNORMAL LOW (ref >60)
Glucose, Bld: 135 mg/dL — ABNORMAL HIGH (ref 70–99)
Potassium: 3.9 mmol/L (ref 3.5–5.1)
Sodium: 137 mmol/L (ref 135–145)
Total Bilirubin: 0.7 mg/dL (ref 0.0–1.2)
Total Protein: 6.3 g/dL — ABNORMAL LOW (ref 6.5–8.1)

## 2023-11-03 LAB — PULMONARY FUNCTION TEST
FEF 25-75 Pre: 1.45 L/s
FEF2575-%Pred-Pre: 66 %
FEV1-%Pred-Pre: 81 %
FEV1-Pre: 2.52 L
FEV1FVC-%Pred-Pre: 88 %
FEV6-%Pred-Pre: 98 %
FEV6-Pre: 3.94 L
FEV6FVC-%Pred-Pre: 106 %
FVC-%Pred-Pre: 91 %
FVC-Pre: 3.94 L
Pre FEV1/FVC ratio: 64 %
Pre FEV6/FVC Ratio: 100 %

## 2023-11-03 LAB — CBC
HCT: 36.8 % — ABNORMAL LOW (ref 39.0–52.0)
Hemoglobin: 12.5 g/dL — ABNORMAL LOW (ref 13.0–17.0)
MCH: 30.3 pg (ref 26.0–34.0)
MCHC: 34 g/dL (ref 30.0–36.0)
MCV: 89.1 fL (ref 80.0–100.0)
Platelets: 140 K/uL — ABNORMAL LOW (ref 150–400)
RBC: 4.13 MIL/uL — ABNORMAL LOW (ref 4.22–5.81)
RDW: 13.5 % (ref 11.5–15.5)
WBC: 10.3 K/uL (ref 4.0–10.5)
nRBC: 0 % (ref 0.0–0.2)

## 2023-11-03 LAB — GLUCOSE, CAPILLARY
Glucose-Capillary: 135 mg/dL — ABNORMAL HIGH (ref 70–99)
Glucose-Capillary: 145 mg/dL — ABNORMAL HIGH (ref 70–99)
Glucose-Capillary: 218 mg/dL — ABNORMAL HIGH (ref 70–99)
Glucose-Capillary: 159 mg/dL — ABNORMAL HIGH (ref 70–99)
Glucose-Capillary: 245 mg/dL — ABNORMAL HIGH (ref 70–99)

## 2023-11-03 LAB — MAGNESIUM: Magnesium: 2.7 mg/dL — ABNORMAL HIGH (ref 1.7–2.4)

## 2023-11-03 MED ORDER — POLYVINYL ALCOHOL 1.4 % OP SOLN
1.0000 [drp] | OPHTHALMIC | Status: DC | PRN
  Administered 2023-11-03 – 2023-11-05 (×6): 1 [drp] via OPHTHALMIC
  Filled 2023-11-03: qty 15

## 2023-11-03 NOTE — Progress Notes (Signed)
 Pt received Move in the Tube sheet, [facility] careguide, and incentive spirometer (IS). Pt was educated on approx length of surgery and stay, importance of ambulation and using IS, restrictions, home needs, and CRPII. Pt will be referred to [facility] for CRP2.  Encouraged walking, sit and stands, and calf raises while waiting for surgery.   Michael FORBES Candy, MS ACSM-CEP 11/03/2023 9:56 AM

## 2023-11-03 NOTE — Progress Notes (Signed)
 Transition of Care [facility]) - Inpatient Brief Assessment   Patient Details  Name: Michael Bender MRN: [id number] Date of Birth: 1945-11-29  Transition of Care [facility]) CM/SW Contact:    Rosaline JONELLE Joe, RN Phone Number: 11/03/2023, 4:31 PM   Clinical Narrative: CM met with the patient at the bedside.  Patient lives alone and plans to return home when stable.  Patient has no DME at home - states he has a few walking sticks at home.  Patient is normally independent but does not drives.  His son provide transportation to appointment.  Patient is likely scheduled for a cardiac cath on Monday - per MD notes.  No other IP Care management needs at this time.   Transition of Care Asessment: Insurance and Status: (P) Insurance coverage has been reviewed Patient has primary care physician: (P) Yes Home environment has been reviewed: (P) from home Prior level of function:: (P) self Prior/Current Home Services: (P) No current home services Social Drivers of Health Review: (P) SDOH reviewed interventions complete Readmission risk has been reviewed: (P) Yes Transition of care needs: (P) no transition of care needs at this time

## 2023-11-03 NOTE — Plan of Care (Signed)
  Problem: Coping: Goal: Level of anxiety will decrease Outcome: Progressing   Problem: Pain Managment: Goal: General experience of comfort will improve and/or be controlled Outcome: Progressing   Problem: Coping: Goal: Ability to adjust to condition or change in health will improve Outcome: Progressing

## 2023-11-03 NOTE — Progress Notes (Signed)
 Progress Note   Patient: Michael Bender FMW:[id number] DOB: 1945-04-29 DOA: 11/01/2023  DOS: the patient was seen and examined on 11/03/2023   Brief hospital course:  78 y.o. 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   Assessment and Plan:   Severe aortic insufficiency/Hx MSSA endocarditis/dyspnea on exertion - Previous evaluation by CT surgery 7/30 with recommendations for surgical correction.  Patient is not a candidate for TAVR.  Patient previously wanted to pursue conservative management however ongoing dyspnea leading to reconsideration of surgical correction.  Evaluated by CT surgery.  Plans to pursue valve replacement early next week.  Cardiac cath to be performed prior to surgical intervention, likely Monday 10/6.  Acute kidney injury on CKD 3b - Slightly worse than yesterday, creatinine up to 3.28.  Likely multifactorial etiology but seems a bit dry.  Holding off on diuresis and cardiac cath for now.  Encourage patient to drink p.o. for now.  If no improvement, will consider adding 500 mL NS at 100 mL.  Will continue to monitor urine output and repeat bmp in a.m.     Chronic HFrEF -Does not appear to be volume overloaded.  No edema appreciated.  No hypoxia.  Cardiology consulted and following closely.   Elevated troponin - Likely demand ischemia.  Troponins minimally elevated and flat.   Atrial fibrillation - Avoiding beta-blocker.  Not on AC despite CHA2DS2-VASc = 6.   CAD/PAD/HLD - Aspirin and statin on board.   Hypertension - Amlodipine  on board.  Holding beta-blockers.   Diabetes mellitus -insulin  sliding scale on board.   Physical debilitation muscle weakness - Patient is very frail, limited by aortic insufficiency as above.  Will hold off on PT eval secondary to  decision by CT surgery.   Subjective: Patient resting comfortably this morning.  Denies any worsening shortness of breath, chest pain, nausea, vomiting, abdominal pain.  Physical Exam:  Vitals:   11/03/23 0048 11/03/23 0408 11/03/23 0500 11/03/23 0739  BP: (!) 125/57 (!) 148/47 (!) 110/48 (!) 134/51  Pulse: 66 66 66 76  Resp: 18 19 18 18   Temp: 97.9 F (36.6 C) 98.5 F (36.9 C)  97.8 F (36.6 C)  TempSrc: Oral     SpO2: 97% 100% 99% 100%    GENERAL:  Alert, pleasant, no acute distress  HEENT:  EOMI CARDIOVASCULAR:  RRR, murmur appreciated RESPIRATORY:  Clear to auscultation, no wheezing, rales, or rhonchi GASTROINTESTINAL:  Soft, nontender, nondistended EXTREMITIES:  thin, No LE edema bilaterally NEURO:  No new focal deficits appreciated SKIN:  No rashes noted PSYCH:  Appropriate mood and affect    Data Reviewed:  Imaging Studies: CT CHEST WO CONTRAST Result Date: 11/02/2023 CLINICAL DATA:  CABG workup short of breath EXAM: CT CHEST WITHOUT CONTRAST TECHNIQUE: Multidetector CT imaging of the chest was performed following the standard protocol without IV contrast. RADIATION DOSE REDUCTION: This exam was performed according to the departmental dose-optimization program which includes automated exposure control, adjustment of the mA and/or kV according to patient size and/or use of iterative reconstruction technique. COMPARISON:  Chest x-ray 11/01/2023, chest CT 07/17/2019 FINDINGS: Cardiovascular: Limited assessment without intravenous contrast. Advanced aortic atherosclerosis. Mild aneurysmal dilatation of the ascending aorta, measuring 4.1 cm maximum. Multi-vessel advanced coronary vascular calcification. Normal cardiac size. No pericardial effusion Mediastinum/Nodes: Patent trachea. No thyroid  mass. No suspicious lymph nodes. Esophagus within normal limits. Lungs/Pleura: Emphysema. Scarring within  the left upper and lower lobes. Band like density and thickening along the right  posterior pleural surface with a few scattered calcifications suggestive of chronic scarring. Mild right lower lobe bronchiectasis. These findings are new when compared to lung bases from CT of September 2024. A few scattered punctate pulmonary nodules, largest seen in the left base and measures 3 mm on series 3, image 119. Upper Abdomen: No acute finding. Multiple renal cysts incompletely visualize, no specific imaging follow-up is recommended. Numerous pancreatic calcifications with pancreatic atrophy consistent with chronic pancreatitis. Musculoskeletal: No acute osseous abnormality. IMPRESSION: 1. Emphysema. Scarring within the left upper and lower lobes. Band like density and thickening along the right posterior pleural surface with a few scattered calcifications suggestive of chronic scarring but new compared with 2024. Mild right lower lobe bronchiectasis. 2. Few small punctate pulmonary nodules measuring up to 3 mm No follow-up needed if patient is low-risk (and has no known or suspected primary neoplasm). Non-contrast chest CT can be considered in 12 months if patient is high-risk. This recommendation follows the consensus statement: Guidelines for Management of Incidental Pulmonary Nodules Detected on CT Images: From the Fleischner Society 2017; Radiology 2017; 284:228-243. 3. Aortic atherosclerosis. Mild aneurysmal dilatation of ascending aorta up to 4.1 cm. Recommend annual imaging followup by CTA or MRA. This recommendation follows 2010 ACCF/AHA/AATS/ACR/ASA/SCA/SCAI/SIR/STS/SVM Guidelines for the Diagnosis and Management of Patients with Thoracic Aortic Disease. Circulation. 2010; 121: Z733-z630. Aortic aneurysm NOS (ICD10-I71.9) 4. Findings consistent with chronic pancreatitis Aortic Atherosclerosis (ICD10-I70.0) and Emphysema (ICD10-J43.9). Electronically Signed   By: Luke Bun M.D.   On: 11/02/2023 21:21   VAS US  LOWER EXTREMITY VENOUS (DVT) Result Date: 11/02/2023  Lower Venous DVT Study  Patient Name:  Michael Bender  Date of Exam:   11/02/2023 Medical Rec #: [id number]         Accession #:    [id number] Date of Birth: July 24, 1945        Patient Gender: M Patient Age:   66 years Exam Location:  [facility] Procedure:      VAS US  LOWER EXTREMITY VENOUS (DVT) Referring Phys: EKTA PATEL --------------------------------------------------------------------------------  Indications: SOB, and Edema (Leg size one is larger than other).  Comparison Study: No previous exams Performing Technologist: Jody Hill RVT, RDMS  Examination Guidelines: A complete evaluation includes B-mode imaging, spectral Doppler, color Doppler, and power Doppler as needed of all accessible portions of each vessel. Bilateral testing is considered an integral part of a complete examination. Limited examinations for reoccurring indications may be performed as noted. The reflux portion of the exam is performed with the patient in reverse Trendelenburg.  +---------+---------------+---------+-----------+----------+--------------+ RIGHT    CompressibilityPhasicitySpontaneityPropertiesThrombus Aging +---------+---------------+---------+-----------+----------+--------------+ CFV      Full           Yes      Yes                                 +---------+---------------+---------+-----------+----------+--------------+ SFJ      Full                                                        +---------+---------------+---------+-----------+----------+--------------+ FV Prox  Full           Yes      Yes                                 +---------+---------------+---------+-----------+----------+--------------+  FV Mid   Full           Yes      Yes                                 +---------+---------------+---------+-----------+----------+--------------+ FV DistalFull           Yes      Yes                                 +---------+---------------+---------+-----------+----------+--------------+ PFV       Full                                                        +---------+---------------+---------+-----------+----------+--------------+ POP      Full           Yes      Yes                                 +---------+---------------+---------+-----------+----------+--------------+ PTV      Full                                                        +---------+---------------+---------+-----------+----------+--------------+ PERO     Full                                                        +---------+---------------+---------+-----------+----------+--------------+   +---------+---------------+---------+-----------+----------+--------------+ LEFT     CompressibilityPhasicitySpontaneityPropertiesThrombus Aging +---------+---------------+---------+-----------+----------+--------------+ CFV      Full           Yes      Yes                                 +---------+---------------+---------+-----------+----------+--------------+ SFJ      Full                                                        +---------+---------------+---------+-----------+----------+--------------+ FV Prox  Full           Yes      Yes                                 +---------+---------------+---------+-----------+----------+--------------+ FV Mid   Full           Yes      Yes                                 +---------+---------------+---------+-----------+----------+--------------+ FV DistalFull  Yes      Yes                                 +---------+---------------+---------+-----------+----------+--------------+ PFV      Full                                                        +---------+---------------+---------+-----------+----------+--------------+ POP      Full           Yes      Yes                                 +---------+---------------+---------+-----------+----------+--------------+ PTV      Full                                                         +---------+---------------+---------+-----------+----------+--------------+ PERO     Full                                                        +---------+---------------+---------+-----------+----------+--------------+     Summary: BILATERAL: - No evidence of deep vein thrombosis seen in the lower extremities, bilaterally. -No evidence of popliteal cyst, bilaterally.   *See table(s) above for measurements and observations. Electronically signed by Debby Robertson on 11/02/2023 at 5:37:58 PM.    Final    ECHOCARDIOGRAM COMPLETE Result Date: 11/02/2023    ECHOCARDIOGRAM REPORT   Patient Name:   RYETT HAMMAN Date of Exam: 11/02/2023 Medical Rec #:  [id number]        Height:       71.0 in Accession #:    [id number]       Weight:       153.2 lb Date of Birth:  Jul 23, 1945       BSA:          1.883 m Patient Age:    86 years         BP:           112/64 mmHg Patient Gender: M                HR:           74 bpm. Exam Location:  Inpatient Procedure: 2D Echo, Cardiac Doppler and Color Doppler (Both Spectral and Color            Flow Doppler were utilized during procedure). Indications:    Aortic regurgitation  History:        Patient has prior history of Echocardiogram examinations, most                 recent 04/21/2023. CHF, COPD, Signs/Symptoms:Shortness of Breath;                 Risk Factors:Hypertension and Former Smoker.  Sonographer:    Juliene Rucks Referring Phys: [id number] ZANE ADAMS  Sonographer Comments: Technically  difficult study due to poor echo windows. Image acquisition challenging due to COPD and Image acquisition challenging due to respiratory motion. IMPRESSIONS  1. Technically difficult study, images off axis  2. Left ventricular ejection fraction, by estimation, is 60 to 65%. Left ventricular ejection fraction by PLAX is 61 %. The left ventricle has normal function. The left ventricle has no regional wall motion abnormalities. Left ventricular diastolic parameters are  consistent with Grade I diastolic dysfunction (impaired relaxation).  3. Right ventricular systolic function is normal. The right ventricular size is normal.  4. The mitral valve is normal in structure. No evidence of mitral valve regurgitation.  5. The aortic valve was not well visualized. Aortic valve regurgitation is mild. No aortic stenosis is present. Comparison(s): Changes from prior study are noted. 04/21/2023: LVEF 40-45%, trivial AI. FINDINGS  Left Ventricle: Left ventricular ejection fraction, by estimation, is 60 to 65%. Left ventricular ejection fraction by PLAX is 61 %. The left ventricle has normal function. The left ventricle has no regional wall motion abnormalities. The left ventricular internal cavity size was normal in size. There is no left ventricular hypertrophy. Left ventricular diastolic parameters are consistent with Grade I diastolic dysfunction (impaired relaxation). Indeterminate filling pressures. Right Ventricle: The right ventricular size is normal. No increase in right ventricular wall thickness. Right ventricular systolic function is normal. Left Atrium: Left atrial size was normal in size. Right Atrium: Right atrial size was normal in size. Pericardium: There is no evidence of pericardial effusion. Mitral Valve: The mitral valve is normal in structure. No evidence of mitral valve regurgitation. Tricuspid Valve: The tricuspid valve is not well visualized. Tricuspid valve regurgitation is not demonstrated. Aortic Valve: The aortic valve was not well visualized. Aortic valve regurgitation is mild. No aortic stenosis is present. Pulmonic Valve: The pulmonic valve was normal in structure. Pulmonic valve regurgitation is not visualized. Aorta: The aortic root and ascending aorta are structurally normal, with no evidence of dilitation. Venous: The inferior vena cava was not well visualized. IAS/Shunts: The interatrial septum was not well visualized.  LEFT VENTRICLE PLAX 2D LV EF:          Left            Diastology                ventricular     LV e' medial:    4.97 cm/s                ejection        LV E/e' medial:  7.8                fraction by     LV e' lateral:   7.77 cm/s                PLAX is 61      LV E/e' lateral: 5.0                %. LVIDd:         4.00 cm LVIDs:         2.70 cm LV PW:         1.10 cm LV IVS:        1.00 cm LVOT diam:     2.10 cm LVOT Area:     3.46 cm  RIGHT VENTRICLE RV Basal diam:  3.30 cm RV Mid diam:    3.00 cm RV S prime:     17.60 cm/s TAPSE (M-mode): 1.5 cm  LEFT ATRIUM           Index        RIGHT ATRIUM           Index LA diam:      2.70 cm 1.43 cm/m   RA Area:     13.80 cm LA Vol (A4C): 37.3 ml 19.81 ml/m  RA Volume:   33.60 ml  17.85 ml/m  MITRAL VALVE MV Area (PHT): 2.83 cm    SHUNTS MV Decel Time: 268 msec    Systemic Diam: 2.10 cm MV E velocity: 38.80 cm/s MV A velocity: 63.40 cm/s MV E/A ratio:  0.61 Vinie Maxcy MD Electronically signed by Vinie Maxcy MD Signature Date/Time: 11/02/2023/2:12:15 PM    Final    DG Chest 2 View Result Date: 11/01/2023 CLINICAL DATA:  Dyspnea on exertion. EXAM: CHEST - 2 VIEW COMPARISON:  04/24/2023 and 04/20/2023 as well as 11/05/2022 FINDINGS: Lungs are adequately inflated without acute airspace consolidation or effusion. No pneumothorax. Possible small nodular opacity over the right mid to lower lung. Cardiomediastinal silhouette and remainder of the exam is unchanged. IMPRESSION: 1. No acute cardiopulmonary disease. 2. Possible small nodular opacity over the right mid to lower lung. Recommend follow-up chest radiograph 3 months. If persistent, recommend noncontrast chest CT for further evaluation. Electronically Signed   By: Toribio Agreste M.D.   On: 11/01/2023 10:50    There are no new results to review at this time.  Previous records (including but not limited to H&P, progress notes, nursing notes, TOC management) were reviewed in assessment of this patient.  Labs: CBC: Recent Labs  Lab  11/01/23 1109 11/02/23 0515 11/03/23 0721  WBC 8.4 7.9 10.3  NEUTROABS 5.9 5.3  --   HGB 13.1 12.2* 12.5*  HCT 38.0* 36.2* 36.8*  MCV 90.0 91.4 89.1  PLT 146* 145* 140*   Basic Metabolic Panel: Recent Labs  Lab 11/01/23 1109 11/02/23 1023 11/03/23 0418  NA 132* 132* 137  K 4.7 4.5 3.9  CL 99 105 106  CO2 18* 15* 17*  GLUCOSE 126* 147* 135*  BUN 48* 51* 57*  CREATININE 3.00* 3.07* 3.28*  CALCIUM  9.2 8.0* 8.3*  MG  --  2.8* 2.7*  PHOS  --  4.6  --    Liver Function Tests: Recent Labs  Lab 11/01/23 1109 11/02/23 1023 11/03/23 0418  AST 29 21 19   ALT 10 12 11   ALKPHOS 101 77 70  BILITOT 0.5 1.0 0.7  PROT 6.9 6.2* 6.3*  ALBUMIN 3.7 3.2* 3.2*   CBG: Recent Labs  Lab 11/02/23 1317 11/02/23 1629 11/02/23 2056 11/03/23 0405 11/03/23 0740  GLUCAP 182* 137* 193* 135* 145*    Scheduled Meds:  amitriptyline   50 mg Oral QHS   amLODipine   5 mg Oral Daily   aspirin EC  81 mg Oral Daily   feeding supplement  237 mL Oral BID BM   heparin  injection (subcutaneous)  5,000 Units Subcutaneous Q12H   hydrALAZINE   50 mg Oral Q8H   insulin  aspart  0-15 Units Subcutaneous TID WC   pantoprazole   40 mg Oral Daily   rOPINIRole   0.25 mg Oral TID   rosuvastatin   40 mg Oral Daily   Continuous Infusions: PRN Meds:.ipratropium-albuterol , SUMAtriptan   Family Communication: None at bedside  Disposition: Status is: Inpatient Remains inpatient appropriate because: Aortic valve insufficiency, AKI     Time spent: 40 minutes  Length of inpatient stay: 1 days  Author: Carliss LELON Canales, DO 11/03/2023 11:30 AM  For on call review [email or web address].

## 2023-11-03 NOTE — Plan of Care (Signed)

## 2023-11-03 NOTE — Progress Notes (Signed)
 Rounding Note   Patient Name: Michael Bender Date of Encounter: 11/03/2023  [facility] Cardiologist: Vina Gull, MD   Subjective Feeling well at rest.  Denies CP/SOB.  Can't tell much difference in his breathing after diuresis.  He hasn't been up much.  Scheduled Meds:  amitriptyline   50 mg Oral QHS   amLODipine   5 mg Oral Daily   aspirin EC  81 mg Oral Daily   feeding supplement  237 mL Oral BID BM   heparin  injection (subcutaneous)  5,000 Units Subcutaneous Q12H   hydrALAZINE   50 mg Oral Q8H   insulin  aspart  0-15 Units Subcutaneous TID WC   pantoprazole   40 mg Oral Daily   rOPINIRole   0.25 mg Oral TID   rosuvastatin   40 mg Oral Daily   Continuous Infusions:  PRN Meds: ipratropium-albuterol , SUMAtriptan    Vital Signs  Vitals:   11/03/23 0048 11/03/23 0408 11/03/23 0500 11/03/23 0739  BP: (!) 125/57 (!) 148/47 (!) 110/48 (!) 134/51  Pulse: 66 66 66 76  Resp: 18 19 18 18   Temp: 97.9 F (36.6 C) 98.5 F (36.9 C)  97.8 F (36.6 C)  TempSrc: Oral     SpO2: 97% 100% 99% 100%    Intake/Output Summary (Last 24 hours) at 11/03/2023 0916 Last data filed at 11/03/2023 0048 Gross per 24 hour  Intake --  Output 2350 ml  Net -2350 ml      10/12/2023   11:06 AM 10/10/2023    9:00 AM 09/25/2023   12:50 PM  Last 3 Weights  Weight (lbs) 153 lb 3.2 oz 153 lb 156 lb 12.8 oz  Weight (kg) 69.491 kg 69.4 kg 71.124 kg      Telemetry Sinus rhythm. - Personally Reviewed  ECG  Sinus rhythm.  Rate 62 bpm. - Personally Reviewed  Physical Exam  VS:  BP (!) 134/51 (BP Location: Left Arm)   Pulse 76   Temp 97.8 F (36.6 C)   Resp 18   SpO2 100%  , BMI There is no height or weight on file to calculate BMI. GENERAL:  Well appearing HEENT: Pupils equal round and reactive, fundi not visualized, oral mucosa unremarkable NECK:  No jugular venous distention, waveform within normal limits, carotid upstroke brisk and symmetric, no bruits, no thyromegaly LUNGS:  Clear to  auscultation bilaterally HEART:  RRR.  PMI not displaced or sustained,S1 and S2 within normal limits, no S3, no S4, no clicks, no rubs, II/IV diastolic murmur ABD:  Flat, positive bowel sounds normal in frequency in pitch, no bruits, no rebound, no guarding, no midline pulsatile mass, no hepatomegaly, no splenomegaly EXT:  2 plus pulses throughout, no edema, no cyanosis no clubbing SKIN:  No rashes no nodules NEURO:  Cranial nerves II through XII grossly intact, motor grossly intact throughout PSYCH:  Cognitively intact, oriented to person place and time   Labs High Sensitivity Troponin:  No results for input(s): TROPONINIHS in the last 720 hours.   Chemistry Recent Labs  Lab 11/01/23 1109 11/02/23 1023 11/03/23 0418  NA 132* 132* 137  K 4.7 4.5 3.9  CL 99 105 106  CO2 18* 15* 17*  GLUCOSE 126* 147* 135*  BUN 48* 51* 57*  CREATININE 3.00* 3.07* 3.28*  CALCIUM  9.2 8.0* 8.3*  MG  --  2.8* 2.7*  PROT 6.9 6.2* 6.3*  ALBUMIN 3.7 3.2* 3.2*  AST 29 21 19   ALT 10 12 11   ALKPHOS 101 77 70  BILITOT 0.5 1.0 0.7  GFRNONAA  21* 20* 19*  ANIONGAP 16* 12 14    Lipids No results for input(s): CHOL, TRIG, HDL, LABVLDL, LDLCALC, CHOLHDL in the last 168 hours.  Hematology Recent Labs  Lab 11/01/23 1109 11/02/23 0515 11/03/23 0721  WBC 8.4 7.9 10.3  RBC 4.22 3.96* 4.13*  HGB 13.1 12.2* 12.5*  HCT 38.0* 36.2* 36.8*  MCV 90.0 91.4 89.1  MCH 31.0 30.8 30.3  MCHC 34.5 33.7 34.0  RDW 13.5 13.9 13.5  PLT 146* 145* 140*   Thyroid  No results for input(s): TSH, FREET4 in the last 168 hours.  BNP Recent Labs  Lab 11/01/23 1109  PROBNP 634.0*    DDimer No results for input(s): DDIMER in the last 168 hours.   Radiology  CT CHEST WO CONTRAST Result Date: 11/02/2023 CLINICAL DATA:  CABG workup short of breath EXAM: CT CHEST WITHOUT CONTRAST TECHNIQUE: Multidetector CT imaging of the chest was performed following the standard protocol without IV contrast. RADIATION DOSE  REDUCTION: This exam was performed according to the departmental dose-optimization program which includes automated exposure control, adjustment of the mA and/or kV according to patient size and/or use of iterative reconstruction technique. COMPARISON:  Chest x-ray 11/01/2023, chest CT 07/17/2019 FINDINGS: Cardiovascular: Limited assessment without intravenous contrast. Advanced aortic atherosclerosis. Mild aneurysmal dilatation of the ascending aorta, measuring 4.1 cm maximum. Multi-vessel advanced coronary vascular calcification. Normal cardiac size. No pericardial effusion Mediastinum/Nodes: Patent trachea. No thyroid  mass. No suspicious lymph nodes. Esophagus within normal limits. Lungs/Pleura: Emphysema. Scarring within the left upper and lower lobes. Band like density and thickening along the right posterior pleural surface with a few scattered calcifications suggestive of chronic scarring. Mild right lower lobe bronchiectasis. These findings are new when compared to lung bases from CT of September 2024. A few scattered punctate pulmonary nodules, largest seen in the left base and measures 3 mm on series 3, image 119. Upper Abdomen: No acute finding. Multiple renal cysts incompletely visualize, no specific imaging follow-up is recommended. Numerous pancreatic calcifications with pancreatic atrophy consistent with chronic pancreatitis. Musculoskeletal: No acute osseous abnormality. IMPRESSION: 1. Emphysema. Scarring within the left upper and lower lobes. Band like density and thickening along the right posterior pleural surface with a few scattered calcifications suggestive of chronic scarring but new compared with 2024. Mild right lower lobe bronchiectasis. 2. Few small punctate pulmonary nodules measuring up to 3 mm No follow-up needed if patient is low-risk (and has no known or suspected primary neoplasm). Non-contrast chest CT can be considered in 12 months if patient is high-risk. This recommendation  follows the consensus statement: Guidelines for Management of Incidental Pulmonary Nodules Detected on CT Images: From the Fleischner Society 2017; Radiology 2017; 284:228-243. 3. Aortic atherosclerosis. Mild aneurysmal dilatation of ascending aorta up to 4.1 cm. Recommend annual imaging followup by CTA or MRA. This recommendation follows 2010 ACCF/AHA/AATS/ACR/ASA/SCA/SCAI/SIR/STS/SVM Guidelines for the Diagnosis and Management of Patients with Thoracic Aortic Disease. Circulation. 2010; 121: Z733-z630. Aortic aneurysm NOS (ICD10-I71.9) 4. Findings consistent with chronic pancreatitis Aortic Atherosclerosis (ICD10-I70.0) and Emphysema (ICD10-J43.9). Electronically Signed   By: Luke Bun M.D.   On: 11/02/2023 21:21   VAS US  LOWER EXTREMITY VENOUS (DVT) Result Date: 11/02/2023  Lower Venous DVT Study Patient Name:  JOVIAN LEMBCKE  Date of Exam:   11/02/2023 Medical Rec #: [id number]         Accession #:    [id number] Date of Birth: 1945/02/03        Patient Gender: M Patient Age:   6 years Exam Location:  [facility] Procedure:      VAS US  LOWER EXTREMITY VENOUS (DVT) Referring Phys: EKTA PATEL --------------------------------------------------------------------------------  Indications: SOB, and Edema (Leg size one is larger than other).  Comparison Study: No previous exams Performing Technologist: Jody Hill RVT, RDMS  Examination Guidelines: A complete evaluation includes B-mode imaging, spectral Doppler, color Doppler, and power Doppler as needed of all accessible portions of each vessel. Bilateral testing is considered an integral part of a complete examination. Limited examinations for reoccurring indications may be performed as noted. The reflux portion of the exam is performed with the patient in reverse Trendelenburg.  +---------+---------------+---------+-----------+----------+--------------+ RIGHT    CompressibilityPhasicitySpontaneityPropertiesThrombus Aging  +---------+---------------+---------+-----------+----------+--------------+ CFV      Full           Yes      Yes                                 +---------+---------------+---------+-----------+----------+--------------+ SFJ      Full                                                        +---------+---------------+---------+-----------+----------+--------------+ FV Prox  Full           Yes      Yes                                 +---------+---------------+---------+-----------+----------+--------------+ FV Mid   Full           Yes      Yes                                 +---------+---------------+---------+-----------+----------+--------------+ FV DistalFull           Yes      Yes                                 +---------+---------------+---------+-----------+----------+--------------+ PFV      Full                                                        +---------+---------------+---------+-----------+----------+--------------+ POP      Full           Yes      Yes                                 +---------+---------------+---------+-----------+----------+--------------+ PTV      Full                                                        +---------+---------------+---------+-----------+----------+--------------+ PERO     Full                                                        +---------+---------------+---------+-----------+----------+--------------+   +---------+---------------+---------+-----------+----------+--------------+  LEFT     CompressibilityPhasicitySpontaneityPropertiesThrombus Aging +---------+---------------+---------+-----------+----------+--------------+ CFV      Full           Yes      Yes                                 +---------+---------------+---------+-----------+----------+--------------+ SFJ      Full                                                         +---------+---------------+---------+-----------+----------+--------------+ FV Prox  Full           Yes      Yes                                 +---------+---------------+---------+-----------+----------+--------------+ FV Mid   Full           Yes      Yes                                 +---------+---------------+---------+-----------+----------+--------------+ FV DistalFull           Yes      Yes                                 +---------+---------------+---------+-----------+----------+--------------+ PFV      Full                                                        +---------+---------------+---------+-----------+----------+--------------+ POP      Full           Yes      Yes                                 +---------+---------------+---------+-----------+----------+--------------+ PTV      Full                                                        +---------+---------------+---------+-----------+----------+--------------+ PERO     Full                                                        +---------+---------------+---------+-----------+----------+--------------+     Summary: BILATERAL: - No evidence of deep vein thrombosis seen in the lower extremities, bilaterally. -No evidence of popliteal cyst, bilaterally.   *See table(s) above for measurements and observations. Electronically signed by Debby Robertson on 11/02/2023 at 5:37:58 PM.    Final    ECHOCARDIOGRAM COMPLETE Result Date: 11/02/2023    ECHOCARDIOGRAM REPORT   Patient Name:  FRANCISCO OSTROVSKY Poust Date of Exam: 11/02/2023 Medical Rec #:  [id number]        Height:       71.0 in Accession #:    [id number]       Weight:       153.2 lb Date of Birth:  12-Feb-1945       BSA:          1.883 m Patient Age:    1 years         BP:           112/64 mmHg Patient Gender: M                HR:           74 bpm. Exam Location:  Inpatient Procedure: 2D Echo, Cardiac Doppler and Color Doppler (Both Spectral and Color             Flow Doppler were utilized during procedure). Indications:    Aortic regurgitation  History:        Patient has prior history of Echocardiogram examinations, most                 recent 04/21/2023. CHF, COPD, Signs/Symptoms:Shortness of Breath;                 Risk Factors:Hypertension and Former Smoker.  Sonographer:    Juliene Rucks Referring Phys: [id number] ZANE ADAMS  Sonographer Comments: Technically difficult study due to poor echo windows. Image acquisition challenging due to COPD and Image acquisition challenging due to respiratory motion. IMPRESSIONS  1. Technically difficult study, images off axis  2. Left ventricular ejection fraction, by estimation, is 60 to 65%. Left ventricular ejection fraction by PLAX is 61 %. The left ventricle has normal function. The left ventricle has no regional wall motion abnormalities. Left ventricular diastolic parameters are consistent with Grade I diastolic dysfunction (impaired relaxation).  3. Right ventricular systolic function is normal. The right ventricular size is normal.  4. The mitral valve is normal in structure. No evidence of mitral valve regurgitation.  5. The aortic valve was not well visualized. Aortic valve regurgitation is mild. No aortic stenosis is present. Comparison(s): Changes from prior study are noted. 04/21/2023: LVEF 40-45%, trivial AI. FINDINGS  Left Ventricle: Left ventricular ejection fraction, by estimation, is 60 to 65%. Left ventricular ejection fraction by PLAX is 61 %. The left ventricle has normal function. The left ventricle has no regional wall motion abnormalities. The left ventricular internal cavity size was normal in size. There is no left ventricular hypertrophy. Left ventricular diastolic parameters are consistent with Grade I diastolic dysfunction (impaired relaxation). Indeterminate filling pressures. Right Ventricle: The right ventricular size is normal. No increase in right ventricular wall thickness. Right ventricular  systolic function is normal. Left Atrium: Left atrial size was normal in size. Right Atrium: Right atrial size was normal in size. Pericardium: There is no evidence of pericardial effusion. Mitral Valve: The mitral valve is normal in structure. No evidence of mitral valve regurgitation. Tricuspid Valve: The tricuspid valve is not well visualized. Tricuspid valve regurgitation is not demonstrated. Aortic Valve: The aortic valve was not well visualized. Aortic valve regurgitation is mild. No aortic stenosis is present. Pulmonic Valve: The pulmonic valve was normal in structure. Pulmonic valve regurgitation is not visualized. Aorta: The aortic root and ascending aorta are structurally normal, with no evidence of dilitation. Venous: The inferior vena cava was not well visualized. IAS/Shunts: The interatrial septum was not well  visualized.  LEFT VENTRICLE PLAX 2D LV EF:         Left            Diastology                ventricular     LV e' medial:    4.97 cm/s                ejection        LV E/e' medial:  7.8                fraction by     LV e' lateral:   7.77 cm/s                PLAX is 61      LV E/e' lateral: 5.0                %. LVIDd:         4.00 cm LVIDs:         2.70 cm LV PW:         1.10 cm LV IVS:        1.00 cm LVOT diam:     2.10 cm LVOT Area:     3.46 cm  RIGHT VENTRICLE RV Basal diam:  3.30 cm RV Mid diam:    3.00 cm RV S prime:     17.60 cm/s TAPSE (M-mode): 1.5 cm LEFT ATRIUM           Index        RIGHT ATRIUM           Index LA diam:      2.70 cm 1.43 cm/m   RA Area:     13.80 cm LA Vol (A4C): 37.3 ml 19.81 ml/m  RA Volume:   33.60 ml  17.85 ml/m  MITRAL VALVE MV Area (PHT): 2.83 cm    SHUNTS MV Decel Time: 268 msec    Systemic Diam: 2.10 cm MV E velocity: 38.80 cm/s MV A velocity: 63.40 cm/s MV E/A ratio:  0.61 Vinie Maxcy MD Electronically signed by Vinie Maxcy MD Signature Date/Time: 11/02/2023/2:12:15 PM    Final    DG Chest 2 View Result Date: 11/01/2023 CLINICAL DATA:  Dyspnea on  exertion. EXAM: CHEST - 2 VIEW COMPARISON:  04/24/2023 and 04/20/2023 as well as 11/05/2022 FINDINGS: Lungs are adequately inflated without acute airspace consolidation or effusion. No pneumothorax. Possible small nodular opacity over the right mid to lower lung. Cardiomediastinal silhouette and remainder of the exam is unchanged. IMPRESSION: 1. No acute cardiopulmonary disease. 2. Possible small nodular opacity over the right mid to lower lung. Recommend follow-up chest radiograph 3 months. If persistent, recommend noncontrast chest CT for further evaluation. Electronically Signed   By: Toribio Agreste M.D.   On: 11/01/2023 10:50    Cardiac Studies TEE 07/24/23: 1. Oscillating, linear density (0.7-0.8 cm) on left coronary cusp of  aortic valve concerning for vegetation; severe AI (best seen on  transgastric views).   2. Left ventricular ejection fraction, by estimation, is 50 to 55%. The  left ventricle has low normal function. The left ventricle has no regional  wall motion abnormalities. The left ventricular internal cavity size was  mildly dilated.   3. Right ventricular systolic function is mildly reduced. The right  ventricular size is normal.   4. Left atrial size was moderately dilated. No left atrial/left atrial  appendage thrombus was detected.   5. The mitral valve is  normal in structure. Mild mitral valve  regurgitation.   6. The aortic valve is tricuspid. Aortic valve regurgitation is severe.   7. There is Moderate (Grade III) plaque involving the descending aorta.   8. 3D performed of the aortic valve.   Echo 11/02/23:   IMPRESSIONS     1. Technically difficult study, images off axis   2. Left ventricular ejection fraction, by estimation, is 60 to 65%. Left  ventricular ejection fraction by PLAX is 61 %. The left ventricle has  normal function. The left ventricle has no regional wall motion  abnormalities. Left ventricular diastolic  parameters are consistent with Grade I  diastolic dysfunction (impaired  relaxation).   3. Right ventricular systolic function is normal. The right ventricular  size is normal.   4. The mitral valve is normal in structure. No evidence of mitral valve  regurgitation.   5. The aortic valve was not well visualized. Aortic valve regurgitation  is mild. No aortic stenosis is present.   Patient Profile   Mr. Bob is a 80M with hypertension, hyperlipidemia, HFpEF, prior CVA, CKD, PAD, severe AR 2/2 bacterial endocarditis admitted with shortness of breath.     Assessment & Plan   # Severe AR:  Patient admitted with shortness of breath but reports feeling at baseline at this time.  At first he continued to decline surgery but has now ag reed.  BNP 634.  He tried to go to the side of the bed to urinate and became very dyspneic.  Attempted 1 dose of IV Lasix  and he was net -2.3 L yesterday.  However renal function continues to worsen.  Hold off on further diuresis.  Planning for left and right heart catheterization once renal function stabilizes.  He will likely go for AVR next week with Dr. Daniel.  Echo this admission showed improvement in LVEF.  Reported is mild but on my review seems moderate to severe.  The jet is eccentric.  Informed Consent   Shared Decision Making/Informed Consent The risks [stroke (1 in 1000), death (1 in 1000), kidney failure [usually temporary] (1 in 500), bleeding (1 in 200), allergic reaction [possibly serious] (1 in 200)], benefits (diagnostic support and management of coronary artery disease) and alternatives of a cardiac catheterization were discussed in detail with Mr. Dyal and he is willing to proceed.  # Elevated troponin:  Hs troponin 67-->62. He denies chest pain.  This is most consistent with demand ischemia.  Planning for [facility] as above.     # PAD: # CAD:  # Aortic atherosclerosis.   # Hyperlipidemia:  Continue medical management.  Resume aspirin and statin.  LDL 36 on 08/2023.    #  Hypertension:  Continue amlodipine .  Will hold bisoprolol  due to severe AR.  His heart rate is low and this will increase the aortic regurgitation.  # Lung nodules:  3 mm.  Need 1 year follow up chest CT.  # Emphysema:  Noted on chest CT.  Also concern for bronchiectasis.      For questions or updates, please contact [facility] Please consult [email or web address] for contact info under    Signed, Annabella Scarce, MD  11/03/2023, 9:16 AM       Leandra Vanderweele C. Scarce, MD, [facility] 11/03/2023 9:16 AM

## 2023-11-04 ENCOUNTER — Inpatient Hospital Stay (HOSPITAL_COMMUNITY)

## 2023-11-04 DIAGNOSIS — N189 Chronic kidney disease, unspecified: Secondary | ICD-10-CM | POA: Diagnosis not present

## 2023-11-04 DIAGNOSIS — Z01810 Encounter for preprocedural cardiovascular examination: Secondary | ICD-10-CM | POA: Diagnosis not present

## 2023-11-04 DIAGNOSIS — I10 Essential (primary) hypertension: Secondary | ICD-10-CM | POA: Diagnosis not present

## 2023-11-04 DIAGNOSIS — N179 Acute kidney failure, unspecified: Secondary | ICD-10-CM | POA: Diagnosis not present

## 2023-11-04 DIAGNOSIS — I351 Nonrheumatic aortic (valve) insufficiency: Secondary | ICD-10-CM | POA: Diagnosis not present

## 2023-11-04 DIAGNOSIS — R0602 Shortness of breath: Secondary | ICD-10-CM | POA: Diagnosis not present

## 2023-11-04 LAB — CBC
HCT: 35.7 % — ABNORMAL LOW (ref 39.0–52.0)
Hemoglobin: 11.7 g/dL — ABNORMAL LOW (ref 13.0–17.0)
MCH: 31 pg (ref 26.0–34.0)
MCHC: 32.8 g/dL (ref 30.0–36.0)
MCV: 94.4 fL (ref 80.0–100.0)
Platelets: 107 K/uL — ABNORMAL LOW (ref 150–400)
RBC: 3.78 MIL/uL — ABNORMAL LOW (ref 4.22–5.81)
RDW: 13.8 % (ref 11.5–15.5)
WBC: 7.7 K/uL (ref 4.0–10.5)
nRBC: 0 % (ref 0.0–0.2)

## 2023-11-04 LAB — BASIC METABOLIC PANEL WITH GFR
Anion gap: 11 (ref 5–15)
Anion gap: 10 (ref 5–15)
BUN: 54 mg/dL — ABNORMAL HIGH (ref 8–23)
BUN: 52 mg/dL — ABNORMAL HIGH (ref 8–23)
CO2: 15 mmol/L — ABNORMAL LOW (ref 22–32)
CO2: 20 mmol/L — ABNORMAL LOW (ref 22–32)
Calcium: 8.1 mg/dL — ABNORMAL LOW (ref 8.9–10.3)
Calcium: 8.4 mg/dL — ABNORMAL LOW (ref 8.9–10.3)
Chloride: 110 mmol/L (ref 98–111)
Chloride: 105 mmol/L (ref 98–111)
Creatinine, Ser: 2.96 mg/dL — ABNORMAL HIGH (ref 0.61–1.24)
Creatinine, Ser: 2.98 mg/dL — ABNORMAL HIGH (ref 0.61–1.24)
GFR, Estimated: 21 mL/min — ABNORMAL LOW (ref >60)
GFR, Estimated: 21 mL/min — ABNORMAL LOW (ref >60)
Glucose, Bld: 156 mg/dL — ABNORMAL HIGH (ref 70–99)
Glucose, Bld: 192 mg/dL — ABNORMAL HIGH (ref 70–99)
Potassium: 4 mmol/L (ref 3.5–5.1)
Potassium: 3.8 mmol/L (ref 3.5–5.1)
Sodium: 136 mmol/L (ref 135–145)
Sodium: 135 mmol/L (ref 135–145)

## 2023-11-04 LAB — GLUCOSE, CAPILLARY
Glucose-Capillary: 143 mg/dL — ABNORMAL HIGH (ref 70–99)
Glucose-Capillary: 232 mg/dL — ABNORMAL HIGH (ref 70–99)
Glucose-Capillary: 187 mg/dL — ABNORMAL HIGH (ref 70–99)
Glucose-Capillary: 111 mg/dL — ABNORMAL HIGH (ref 70–99)

## 2023-11-04 LAB — MAGNESIUM: Magnesium: 2.7 mg/dL — ABNORMAL HIGH (ref 1.7–2.4)

## 2023-11-04 MED ORDER — MELATONIN 3 MG PO TABS
3.0000 mg | ORAL_TABLET | Freq: Every day | ORAL | Status: DC
  Administered 2023-11-04 – 2023-11-14 (×9): 3 mg via ORAL
  Filled 2023-11-04 (×11): qty 1

## 2023-11-04 MED ORDER — TRAZODONE HCL 50 MG PO TABS
50.0000 mg | ORAL_TABLET | Freq: Every evening | ORAL | Status: DC | PRN
  Administered 2023-11-04: 50 mg via ORAL
  Filled 2023-11-04: qty 1

## 2023-11-04 MED ORDER — SODIUM BICARBONATE 8.4 % IV SOLN
INTRAVENOUS | Status: AC
  Filled 2023-11-04: qty 1000

## 2023-11-04 NOTE — Progress Notes (Signed)
 Progress Note   Patient: Michael Bender FMW:[id number] DOB: 03-Apr-1945 DOA: 11/01/2023  DOS: the patient was seen and examined on 11/04/2023   Brief hospital course:  78 y.o. 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   Assessment and Plan:   Severe aortic insufficiency/Hx MSSA endocarditis/dyspnea on exertion - Previous evaluation by CT surgery 7/30 with recommendations for surgical correction.  Patient is not a candidate for TAVR.  Patient previously wanted to pursue conservative management however ongoing dyspnea leading to reconsideration of surgical correction.  Evaluated by CT surgery.  Plans to pursue valve replacement early next week.  Cardiac cath to be performed prior to surgical intervention, likely Monday 10/6.   Acute kidney injury on CKD 3b-metabolic acidosis - Creatinine slightly improved from yesterday, however now with worsening acidemia.  Likely multifactorial etiology but seems a bit dry.  Holding off on diuresis and cardiac cath for now.  Will initiate bicarb drip 500 mL  at 100 mL/hr.  Will continue to monitor urine output and repeat bmp in a.m. ideally will have kidney function improve as to not delay impending cardiac cath   Chronic HFrEF -Does not appear to be volume overloaded.  No edema appreciated.  No hypoxia.  Cardiology consulted and following closely.   Elevated troponin - Likely demand ischemia.  Troponins minimally elevated and flat.   Atrial fibrillation - Avoiding beta-blocker.  Not on AC despite CHA2DS2-VASc = 6.   CAD/PAD/HLD - Aspirin and statin on board.   Hypertension - Amlodipine  on board.  Holding beta-blockers.   Diabetes mellitus -insulin  sliding scale on board.   Physical debilitation muscle weakness - Patient is very frail, limited by  aortic insufficiency as above.  Will hold off on PT eval secondary to decision by CT surgery.   Subjective: Patient resting comfortably this morning.  States did not sleep well overnight.  Denies any fever, chills, worsening shortness of breath, chest pain, nausea vomiting, abdominal pain.  Physical Exam:  Vitals:   11/03/23 2000 11/04/23 0022 11/04/23 0419 11/04/23 0836  BP: (!) 123/54 (!) 123/51 (!) 125/58 (!) 106/52  Pulse: 81 81 84 98  Resp: 18 18 18 17   Temp: 98.1 F (36.7 C) 98.6 F (37 C) 97.8 F (36.6 C) 97.7 F (36.5 C)  TempSrc:      SpO2: 99% 98% 100% 95%    GENERAL:  Alert, pleasant, no acute distress  HEENT:  EOMI CARDIOVASCULAR:  RRR, murmur appreciated RESPIRATORY:  Clear to auscultation, no wheezing, rales, or rhonchi GASTROINTESTINAL:  Soft, nontender, nondistended EXTREMITIES:  thin, No LE edema bilaterally NEURO:  No new focal deficits appreciated SKIN:  No rashes noted PSYCH:  Appropriate mood and affect    Data Reviewed:  Imaging Studies: ECHOCARDIOGRAM COMPLETE Result Date: 11/03/2023    ECHOCARDIOGRAM REPORT   Patient Name:   Michael Bender Date of Exam: 11/02/2023 Medical Rec #:  [id number]        Height:       71.0 in Accession #:    [id number]       Weight:       153.2 lb Date of Birth:  03-21-1945       BSA:          1.883 m Patient Age:    77 years         BP:  112/64 mmHg Patient Gender: M                HR:           74 bpm. Exam Location:  Inpatient Procedure: 2D Echo, Cardiac Doppler and Color Doppler (Both Spectral and Color            Flow Doppler were utilized during procedure).                                 MODIFIED REPORT:  This report was modified by Vinie Maxcy MD on 11/03/2023 due to Re-evaluated           aortic insufficiency in light of more recent TEE comparison.  Indications:     Aortic regurgitation  History:         Patient has prior history of Echocardiogram examinations, most                  recent 04/21/2023. CHF, COPD,  Signs/Symptoms:Shortness of                  Breath; Risk Factors:Hypertension and Former Smoker.  Sonographer:     Juliene Rucks Referring Phys:  [id number] ZANE ADAMS Diagnosing Phys: Vinie Maxcy MD  Sonographer Comments: Technically difficult study due to poor echo windows. Image acquisition challenging due to COPD and Image acquisition challenging due to respiratory motion. IMPRESSIONS  1. Technically difficult study, images off axis  2. Left ventricular ejection fraction, by estimation, is 60 to 65%. Left ventricular ejection fraction by PLAX is 61 %. The left ventricle has normal function. The left ventricle has no regional wall motion abnormalities. Left ventricular diastolic parameters are consistent with Grade I diastolic dysfunction (impaired relaxation).  3. Right ventricular systolic function is normal. The right ventricular size is normal.  4. The mitral valve is normal in structure. No evidence of mitral valve regurgitation.  5. Calcified aortic root. There appears to be eccentric, posteriorly directed regurgitation (worst on image 77). The aortic valve was not well visualized. Aortic valve regurgitation is moderate. Aortic valve sclerosis/calcification is present, without any evidence of aortic stenosis. Comparison(s): Changes from prior study are noted. 07/24/2023: LVEF 50-55%, severe AI with oscillating density on the left coronary cusp. FINDINGS  Left Ventricle: Left ventricular ejection fraction, by estimation, is 60 to 65%. Left ventricular ejection fraction by PLAX is 61 %. The left ventricle has normal function. The left ventricle has no regional wall motion abnormalities. The left ventricular internal cavity size was normal in size. There is no left ventricular hypertrophy. Left ventricular diastolic parameters are consistent with Grade I diastolic dysfunction (impaired relaxation). Indeterminate filling pressures. Right Ventricle: The right ventricular size is normal. No increase in right  ventricular wall thickness. Right ventricular systolic function is normal. Left Atrium: Left atrial size was normal in size. Right Atrium: Right atrial size was normal in size. Pericardium: There is no evidence of pericardial effusion. Mitral Valve: The mitral valve is normal in structure. No evidence of mitral valve regurgitation. Tricuspid Valve: The tricuspid valve is not well visualized. Tricuspid valve regurgitation is not demonstrated. Aortic Valve: Calcified aortic root. There appears to be eccentric, posteriorly directed regurgitation (worst on image 77). The aortic valve was not well visualized. There is moderate aortic valve annular calcification. Aortic valve regurgitation is moderate. Aortic valve sclerosis/calcification is present, without any evidence of aortic stenosis. Pulmonic Valve: The pulmonic valve was normal in  structure. Pulmonic valve regurgitation is not visualized. Aorta: The aortic root and ascending aorta are structurally normal, with no evidence of dilitation. Venous: The inferior vena cava was not well visualized. IAS/Shunts: The interatrial septum was not well visualized.  LEFT VENTRICLE PLAX 2D LV EF:         Left            Diastology                ventricular     LV e' medial:    4.97 cm/s                ejection        LV E/e' medial:  7.8                fraction by     LV e' lateral:   7.77 cm/s                PLAX is 61      LV E/e' lateral: 5.0                %. LVIDd:         4.00 cm LVIDs:         2.70 cm LV PW:         1.10 cm LV IVS:        1.00 cm LVOT diam:     2.10 cm LVOT Area:     3.46 cm  RIGHT VENTRICLE RV Basal diam:  3.30 cm RV Mid diam:    3.00 cm RV S prime:     17.60 cm/s TAPSE (M-mode): 1.5 cm LEFT ATRIUM           Index        RIGHT ATRIUM           Index LA diam:      2.70 cm 1.43 cm/m   RA Area:     13.80 cm LA Vol (A4C): 37.3 ml 19.81 ml/m  RA Volume:   33.60 ml  17.85 ml/m  MITRAL VALVE MV Area (PHT): 2.83 cm    SHUNTS MV Decel Time: 268 msec     Systemic Diam: 2.10 cm MV E velocity: 38.80 cm/s MV A velocity: 63.40 cm/s MV E/A ratio:  0.61 Vinie Maxcy MD Electronically signed by Vinie Maxcy MD Signature Date/Time: 11/02/2023/2:12:15 PM    Final (Updated)    CT CHEST WO CONTRAST Result Date: 11/02/2023 CLINICAL DATA:  CABG workup short of breath EXAM: CT CHEST WITHOUT CONTRAST TECHNIQUE: Multidetector CT imaging of the chest was performed following the standard protocol without IV contrast. RADIATION DOSE REDUCTION: This exam was performed according to the departmental dose-optimization program which includes automated exposure control, adjustment of the mA and/or kV according to patient size and/or use of iterative reconstruction technique. COMPARISON:  Chest x-ray 11/01/2023, chest CT 07/17/2019 FINDINGS: Cardiovascular: Limited assessment without intravenous contrast. Advanced aortic atherosclerosis. Mild aneurysmal dilatation of the ascending aorta, measuring 4.1 cm maximum. Multi-vessel advanced coronary vascular calcification. Normal cardiac size. No pericardial effusion Mediastinum/Nodes: Patent trachea. No thyroid  mass. No suspicious lymph nodes. Esophagus within normal limits. Lungs/Pleura: Emphysema. Scarring within the left upper and lower lobes. Band like density and thickening along the right posterior pleural surface with a few scattered calcifications suggestive of chronic scarring. Mild right lower lobe bronchiectasis. These findings are new when compared to lung bases from CT of September 2024. A few scattered punctate pulmonary nodules, largest seen in the  left base and measures 3 mm on series 3, image 119. Upper Abdomen: No acute finding. Multiple renal cysts incompletely visualize, no specific imaging follow-up is recommended. Numerous pancreatic calcifications with pancreatic atrophy consistent with chronic pancreatitis. Musculoskeletal: No acute osseous abnormality. IMPRESSION: 1. Emphysema. Scarring within the left upper and lower  lobes. Band like density and thickening along the right posterior pleural surface with a few scattered calcifications suggestive of chronic scarring but new compared with 2024. Mild right lower lobe bronchiectasis. 2. Few small punctate pulmonary nodules measuring up to 3 mm No follow-up needed if patient is low-risk (and has no known or suspected primary neoplasm). Non-contrast chest CT can be considered in 12 months if patient is high-risk. This recommendation follows the consensus statement: Guidelines for Management of Incidental Pulmonary Nodules Detected on CT Images: From the Fleischner Society 2017; Radiology 2017; 284:228-243. 3. Aortic atherosclerosis. Mild aneurysmal dilatation of ascending aorta up to 4.1 cm. Recommend annual imaging followup by CTA or MRA. This recommendation follows 2010 ACCF/AHA/AATS/ACR/ASA/SCA/SCAI/SIR/STS/SVM Guidelines for the Diagnosis and Management of Patients with Thoracic Aortic Disease. Circulation. 2010; 121: Z733-z630. Aortic aneurysm NOS (ICD10-I71.9) 4. Findings consistent with chronic pancreatitis Aortic Atherosclerosis (ICD10-I70.0) and Emphysema (ICD10-J43.9). Electronically Signed   By: Luke Bun M.D.   On: 11/02/2023 21:21   VAS US  LOWER EXTREMITY VENOUS (DVT) Result Date: 11/02/2023  Lower Venous DVT Study Patient Name:  Michael Bender  Date of Exam:   11/02/2023 Medical Rec #: [id number]         Accession #:    [id number] Date of Birth: 1946-01-06        Patient Gender: M Patient Age:   65 years Exam Location:  [facility] Procedure:      VAS US  LOWER EXTREMITY VENOUS (DVT) Referring Phys: EKTA PATEL --------------------------------------------------------------------------------  Indications: SOB, and Edema (Leg size one is larger than other).  Comparison Study: No previous exams Performing Technologist: Jody Hill RVT, RDMS  Examination Guidelines: A complete evaluation includes B-mode imaging, spectral Doppler, color Doppler, and power Doppler  as needed of all accessible portions of each vessel. Bilateral testing is considered an integral part of a complete examination. Limited examinations for reoccurring indications may be performed as noted. The reflux portion of the exam is performed with the patient in reverse Trendelenburg.  +---------+---------------+---------+-----------+----------+--------------+ RIGHT    CompressibilityPhasicitySpontaneityPropertiesThrombus Aging +---------+---------------+---------+-----------+----------+--------------+ CFV      Full           Yes      Yes                                 +---------+---------------+---------+-----------+----------+--------------+ SFJ      Full                                                        +---------+---------------+---------+-----------+----------+--------------+ FV Prox  Full           Yes      Yes                                 +---------+---------------+---------+-----------+----------+--------------+ FV Mid   Full           Yes      Yes                                 +---------+---------------+---------+-----------+----------+--------------+  FV DistalFull           Yes      Yes                                 +---------+---------------+---------+-----------+----------+--------------+ PFV      Full                                                        +---------+---------------+---------+-----------+----------+--------------+ POP      Full           Yes      Yes                                 +---------+---------------+---------+-----------+----------+--------------+ PTV      Full                                                        +---------+---------------+---------+-----------+----------+--------------+ PERO     Full                                                        +---------+---------------+---------+-----------+----------+--------------+    +---------+---------------+---------+-----------+----------+--------------+ LEFT     CompressibilityPhasicitySpontaneityPropertiesThrombus Aging +---------+---------------+---------+-----------+----------+--------------+ CFV      Full           Yes      Yes                                 +---------+---------------+---------+-----------+----------+--------------+ SFJ      Full                                                        +---------+---------------+---------+-----------+----------+--------------+ FV Prox  Full           Yes      Yes                                 +---------+---------------+---------+-----------+----------+--------------+ FV Mid   Full           Yes      Yes                                 +---------+---------------+---------+-----------+----------+--------------+ FV DistalFull           Yes      Yes                                 +---------+---------------+---------+-----------+----------+--------------+ PFV      Full                                                        +---------+---------------+---------+-----------+----------+--------------+  POP      Full           Yes      Yes                                 +---------+---------------+---------+-----------+----------+--------------+ PTV      Full                                                        +---------+---------------+---------+-----------+----------+--------------+ PERO     Full                                                        +---------+---------------+---------+-----------+----------+--------------+     Summary: BILATERAL: - No evidence of deep vein thrombosis seen in the lower extremities, bilaterally. -No evidence of popliteal cyst, bilaterally.   *See table(s) above for measurements and observations. Electronically signed by Debby Robertson on 11/02/2023 at 5:37:58 PM.    Final    DG Chest 2 View Result Date: 11/01/2023 CLINICAL DATA:  Dyspnea  on exertion. EXAM: CHEST - 2 VIEW COMPARISON:  04/24/2023 and 04/20/2023 as well as 11/05/2022 FINDINGS: Lungs are adequately inflated without acute airspace consolidation or effusion. No pneumothorax. Possible small nodular opacity over the right mid to lower lung. Cardiomediastinal silhouette and remainder of the exam is unchanged. IMPRESSION: 1. No acute cardiopulmonary disease. 2. Possible small nodular opacity over the right mid to lower lung. Recommend follow-up chest radiograph 3 months. If persistent, recommend noncontrast chest CT for further evaluation. Electronically Signed   By: Toribio Agreste M.D.   On: 11/01/2023 10:50    There are no new results to review at this time.  Previous records (including but not limited to H&P, progress notes, nursing notes, TOC management) were reviewed in assessment of this patient.  Labs: CBC: Recent Labs  Lab 11/01/23 1109 11/02/23 0515 11/03/23 0721 11/04/23 0653  WBC 8.4 7.9 10.3 7.7  NEUTROABS 5.9 5.3  --   --   HGB 13.1 12.2* 12.5* 11.7*  HCT 38.0* 36.2* 36.8* 35.7*  MCV 90.0 91.4 89.1 94.4  PLT 146* 145* 140* 107*   Basic Metabolic Panel: Recent Labs  Lab 11/01/23 1109 11/02/23 1023 11/03/23 0418 11/04/23 0653  NA 132* 132* 137 136  K 4.7 4.5 3.9 4.0  CL 99 105 106 110  CO2 18* 15* 17* 15*  GLUCOSE 126* 147* 135* 156*  BUN 48* 51* 57* 54*  CREATININE 3.00* 3.07* 3.28* 2.96*  CALCIUM  9.2 8.0* 8.3* 8.1*  MG  --  2.8* 2.7* 2.7*  PHOS  --  4.6  --   --    Liver Function Tests: Recent Labs  Lab 11/01/23 1109 11/02/23 1023 11/03/23 0418  AST 29 21 19   ALT 10 12 11   ALKPHOS 101 77 70  BILITOT 0.5 1.0 0.7  PROT 6.9 6.2* 6.3*  ALBUMIN 3.7 3.2* 3.2*   CBG: Recent Labs  Lab 11/03/23 0740 11/03/23 1153 11/03/23 1657 11/03/23 2011 11/04/23 0808  GLUCAP 145* 218* 159* 245* 143*    Scheduled Meds:  amitriptyline   50 mg Oral QHS   amLODipine   5 mg Oral Daily   aspirin EC  81 mg Oral Daily   feeding supplement  237  mL Oral BID BM   heparin  injection (subcutaneous)  5,000 Units Subcutaneous Q12H   hydrALAZINE   50 mg Oral Q8H   insulin  aspart  0-15 Units Subcutaneous TID WC   melatonin  3 mg Oral QHS   pantoprazole   40 mg Oral Daily   rOPINIRole   0.25 mg Oral TID   rosuvastatin   40 mg Oral Daily   Continuous Infusions:  sodium bicarbonate 150 mEq in dextrose  5 % 1,150 mL infusion     PRN Meds:.artificial tears, ipratropium-albuterol , SUMAtriptan , traZODone  Family Communication: None at bedside  Disposition: Status is: Inpatient Remains inpatient appropriate because: Pending cardiac cath and valve repair     Time spent: 33 minutes  Length of inpatient stay: 2 days  Author: Carliss LELON Canales, DO 11/04/2023 10:18 AM  For on call review [email or web address].

## 2023-11-04 NOTE — Plan of Care (Signed)

## 2023-11-04 NOTE — Progress Notes (Signed)
 Progress Note  Patient Name: Michael Bender Date of Encounter: 11/04/2023  Primary Cardiologist: Vina Gull, MD  Subjective   No acute events overnight.  Inpatient Medications    Scheduled Meds:  amitriptyline   50 mg Oral QHS   amLODipine   5 mg Oral Daily   aspirin EC  81 mg Oral Daily   feeding supplement  237 mL Oral BID BM   heparin  injection (subcutaneous)  5,000 Units Subcutaneous Q12H   hydrALAZINE   50 mg Oral Q8H   insulin  aspart  0-15 Units Subcutaneous TID WC   melatonin  3 mg Oral QHS   pantoprazole   40 mg Oral Daily   rOPINIRole   0.25 mg Oral TID   rosuvastatin   40 mg Oral Daily   Continuous Infusions:  sodium bicarbonate 150 mEq in dextrose  5 % 1,150 mL infusion 100 mL/hr at 11/04/23 1135   PRN Meds: artificial tears, ipratropium-albuterol , SUMAtriptan , traZODone   Vital Signs    Vitals:   11/03/23 2000 11/04/23 0022 11/04/23 0419 11/04/23 0836  BP: (!) 123/54 (!) 123/51 (!) 125/58 (!) 106/52  Pulse: 81 81 84 98  Resp: 18 18 18 17   Temp: 98.1 F (36.7 C) 98.6 F (37 C) 97.8 F (36.6 C) 97.7 F (36.5 C)  TempSrc:      SpO2: 99% 98% 100% 95%    Intake/Output Summary (Last 24 hours) at 11/04/2023 1212 Last data filed at 11/04/2023 0421 Gross per 24 hour  Intake --  Output 1375 ml  Net -1375 ml   There were no vitals filed for this visit.  Telemetry     Personally reviewed. NSR.  ECG   Not performed today  Physical Exam   GEN: No acute distress.   Neck: No JVD. Cardiac: RRR, no murmur, rub, or gallop.  Respiratory: Nonlabored. Clear to auscultation bilaterally. GI: Soft, nontender, bowel sounds present. MS: No edema; No deformity. Neuro:  Nonfocal. Psych: Alert and oriented x 3. Normal affect.  Labs    Chemistry Recent Labs  Lab 11/01/23 1109 11/02/23 1023 11/03/23 0418 11/04/23 0653  NA 132* 132* 137 136  K 4.7 4.5 3.9 4.0  CL 99 105 106 110  CO2 18* 15* 17* 15*  GLUCOSE 126* 147* 135* 156*  BUN 48* 51* 57* 54*   CREATININE 3.00* 3.07* 3.28* 2.96*  CALCIUM  9.2 8.0* 8.3* 8.1*  PROT 6.9 6.2* 6.3*  --   ALBUMIN 3.7 3.2* 3.2*  --   AST 29 21 19   --   ALT 10 12 11   --   ALKPHOS 101 77 70  --   BILITOT 0.5 1.0 0.7  --   GFRNONAA 21* 20* 19* 21*  ANIONGAP 16* 12 14 11      Hematology Recent Labs  Lab 11/02/23 0515 11/03/23 0721 11/04/23 0653  WBC 7.9 10.3 7.7  RBC 3.96* 4.13* 3.78*  HGB 12.2* 12.5* 11.7*  HCT 36.2* 36.8* 35.7*  MCV 91.4 89.1 94.4  MCH 30.8 30.3 31.0  MCHC 33.7 34.0 32.8  RDW 13.9 13.5 13.8  PLT 145* 140* 107*    Cardiac EnzymesNo results for input(s): TROPONINIHS in the last 720 hours.  BNP Recent Labs  Lab 11/01/23 1109  PROBNP 634.0*     DDimerNo results for input(s): DDIMER in the last 168 hours.    Assessment & Plan    Severe AR - Presented with DOE.  BNP 634.  Received IV Lasix  1 dose this admission with adequate urine output. However kidney function continues to worsen.  Hold further diuresis.  Planning for [facility] and [facility] once renal function stabilizes.  Echo showed improvement in LVEF, moderate to severe aortic regurgitation, eccentric jet.  HTN, controlled - Continue amlodipine , hold bisoprolol  due to severe AR.  30 minutes spent in review the prior records, reports, more than 3 labs, discussion and documentation.  Signed, Diannah SHAUNNA Maywood, MD  11/04/2023, 12:12 PM

## 2023-11-04 NOTE — Progress Notes (Signed)
 Pre-CABG testing has been completed. Preliminary results can be found in CV Proc through chart review.   11/04/23 12:16 PM Cathlyn Collet RVT

## 2023-11-05 DIAGNOSIS — I351 Nonrheumatic aortic (valve) insufficiency: Secondary | ICD-10-CM | POA: Diagnosis not present

## 2023-11-05 DIAGNOSIS — I10 Essential (primary) hypertension: Secondary | ICD-10-CM | POA: Diagnosis not present

## 2023-11-05 DIAGNOSIS — R0602 Shortness of breath: Secondary | ICD-10-CM | POA: Diagnosis not present

## 2023-11-05 DIAGNOSIS — N179 Acute kidney failure, unspecified: Secondary | ICD-10-CM | POA: Diagnosis not present

## 2023-11-05 DIAGNOSIS — N189 Chronic kidney disease, unspecified: Secondary | ICD-10-CM | POA: Diagnosis not present

## 2023-11-05 LAB — BASIC METABOLIC PANEL WITH GFR
Anion gap: 12 (ref 5–15)
BUN: 49 mg/dL — ABNORMAL HIGH (ref 8–23)
CO2: 21 mmol/L — ABNORMAL LOW (ref 22–32)
Calcium: 8.5 mg/dL — ABNORMAL LOW (ref 8.9–10.3)
Chloride: 104 mmol/L (ref 98–111)
Creatinine, Ser: 2.87 mg/dL — ABNORMAL HIGH (ref 0.61–1.24)
GFR, Estimated: 22 mL/min — ABNORMAL LOW (ref >60)
Glucose, Bld: 132 mg/dL — ABNORMAL HIGH (ref 70–99)
Potassium: 4 mmol/L (ref 3.5–5.1)
Sodium: 137 mmol/L (ref 135–145)

## 2023-11-05 LAB — GLUCOSE, CAPILLARY
Glucose-Capillary: 219 mg/dL — ABNORMAL HIGH (ref 70–99)
Glucose-Capillary: 135 mg/dL — ABNORMAL HIGH (ref 70–99)
Glucose-Capillary: 132 mg/dL — ABNORMAL HIGH (ref 70–99)
Glucose-Capillary: 243 mg/dL — ABNORMAL HIGH (ref 70–99)

## 2023-11-05 LAB — MAGNESIUM: Magnesium: 2.8 mg/dL — ABNORMAL HIGH (ref 1.7–2.4)

## 2023-11-05 MED ORDER — POLYETHYLENE GLYCOL 3350 17 G PO PACK
17.0000 g | PACK | Freq: Every day | ORAL | Status: DC
  Administered 2023-11-05 – 2023-11-07 (×2): 17 g via ORAL
  Filled 2023-11-05 (×2): qty 1

## 2023-11-05 MED ORDER — ASPIRIN 81 MG PO CHEW
81.0000 mg | CHEWABLE_TABLET | ORAL | Status: AC
  Administered 2023-11-06: 81 mg via ORAL
  Filled 2023-11-05: qty 1

## 2023-11-05 MED ORDER — BISACODYL 5 MG PO TBEC
5.0000 mg | DELAYED_RELEASE_TABLET | Freq: Every day | ORAL | Status: DC | PRN
  Administered 2023-11-05: 5 mg via ORAL
  Filled 2023-11-05: qty 1

## 2023-11-05 MED ORDER — SODIUM CHLORIDE 0.9 % IV SOLN: INTRAVENOUS | Status: AC

## 2023-11-05 MED ORDER — SODIUM CHLORIDE 0.9 % IV SOLN: INTRAVENOUS | Status: DC

## 2023-11-05 MED ORDER — ZOLPIDEM TARTRATE 5 MG PO TABS
5.0000 mg | ORAL_TABLET | Freq: Every evening | ORAL | Status: DC | PRN
  Administered 2023-11-05 – 2023-11-07 (×2): 5 mg via ORAL
  Filled 2023-11-05 (×3): qty 1

## 2023-11-05 NOTE — Progress Notes (Signed)
 Progress Note  Patient Name: Michael Bender Date of Encounter: 11/05/2023  Primary Cardiologist: Vina Gull, MD  Subjective   No acute events overnight.  Inpatient Medications    Scheduled Meds:  amitriptyline   50 mg Oral QHS   amLODipine   5 mg Oral Daily   aspirin EC  81 mg Oral Daily   feeding supplement  237 mL Oral BID BM   heparin  injection (subcutaneous)  5,000 Units Subcutaneous Q12H   hydrALAZINE   50 mg Oral Q8H   insulin  aspart  0-15 Units Subcutaneous TID WC   melatonin  3 mg Oral QHS   pantoprazole   40 mg Oral Daily   polyethylene glycol  17 g Oral Daily   rOPINIRole   0.25 mg Oral TID   rosuvastatin   40 mg Oral Daily   Continuous Infusions:  sodium chloride  100 mL/hr at 11/05/23 0902   PRN Meds: artificial tears, bisacodyl , ipratropium-albuterol , SUMAtriptan , zolpidem    Vital Signs    Vitals:   11/04/23 1942 11/04/23 2343 11/05/23 0348 11/05/23 0738  BP: (!) 126/53 (!) 119/57 (!) 124/59 (!) 155/59  Pulse: 86 81 74 88  Resp: 18 18 18 16   Temp: 97.7 F (36.5 C) 98.3 F (36.8 C) 98.1 F (36.7 C) 98.4 F (36.9 C)  TempSrc: Oral Oral Oral   SpO2: 99% 99% 99% 99%   No intake or output data in the 24 hours ending 11/05/23 1125  There were no vitals filed for this visit.  Telemetry     Personally reviewed. NSR.  ECG   Not performed today  Physical Exam   GEN: No acute distress.   Neck: No JVD. Cardiac: RRR, no murmur, rub, or gallop.  Respiratory: Nonlabored. Clear to auscultation bilaterally. GI: Soft, nontender, bowel sounds present. MS: No edema; No deformity. Neuro:  Nonfocal. Psych: Alert and oriented x 3. Normal affect.  Labs    Chemistry Recent Labs  Lab 11/01/23 1109 11/02/23 1023 11/03/23 0418 11/04/23 0653 11/04/23 1648 11/05/23 0433  NA 132* 132* 137 136 135 137  K 4.7 4.5 3.9 4.0 3.8 4.0  CL 99 105 106 110 105 104  CO2 18* 15* 17* 15* 20* 21*  GLUCOSE 126* 147* 135* 156* 192* 132*  BUN 48* 51* 57* 54* 52* 49*   CREATININE 3.00* 3.07* 3.28* 2.96* 2.98* 2.87*  CALCIUM  9.2 8.0* 8.3* 8.1* 8.4* 8.5*  PROT 6.9 6.2* 6.3*  --   --   --   ALBUMIN 3.7 3.2* 3.2*  --   --   --   AST 29 21 19   --   --   --   ALT 10 12 11   --   --   --   ALKPHOS 101 77 70  --   --   --   BILITOT 0.5 1.0 0.7  --   --   --   GFRNONAA 21* 20* 19* 21* 21* 22*  ANIONGAP 16* 12 14 11 10 12      Hematology Recent Labs  Lab 11/02/23 0515 11/03/23 0721 11/04/23 0653  WBC 7.9 10.3 7.7  RBC 3.96* 4.13* 3.78*  HGB 12.2* 12.5* 11.7*  HCT 36.2* 36.8* 35.7*  MCV 91.4 89.1 94.4  MCH 30.8 30.3 31.0  MCHC 33.7 34.0 32.8  RDW 13.9 13.5 13.8  PLT 145* 140* 107*    Cardiac EnzymesNo results for input(s): TROPONINIHS in the last 720 hours.  BNP Recent Labs  Lab 11/01/23 1109  PROBNP 634.0*     DDimerNo results for input(s): DDIMER  in the last 168 hours.    Assessment & Plan    Severe AR - Presented with DOE.  BNP 634.  Received IV Lasix  1 dose this admission with adequate urine output. However kidney function continues to worsen.  Hold further diuresis.  Planning for [facility] and [facility] once renal function stabilizes. Continue IVF prior to [facility].  Echo showed improvement in LVEF, moderate to severe aortic regurgitation, eccentric jet.  Informed Consent   Shared Decision Making/Informed Consent The risks [stroke (1 in 1000), death (1 in 1000), kidney failure [usually temporary] (1 in 500), bleeding (1 in 200), allergic reaction [possibly serious] (1 in 200)], benefits (diagnostic support and management of coronary artery disease) and alternatives of a cardiac catheterization were discussed in detail with Mr. Maselli and he is willing to proceed.  HTN, controlled - Continue amlodipine , hold bisoprolol  due to severe AR.  30 minutes spent in review the prior records, reports, more than 3 labs, discussion and documentation.  Signed, Diannah SHAUNNA Maywood, MD  11/05/2023, 11:25 AM

## 2023-11-05 NOTE — Plan of Care (Signed)

## 2023-11-05 NOTE — Progress Notes (Signed)
 Progress Note   Patient: Michael Bender FMW:[id number] DOB: 09-06-1945 DOA: 11/01/2023  DOS: the patient was seen and examined on 11/05/2023   Brief hospital course:  78 y.o. 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   Assessment and Plan:   Severe aortic insufficiency/Hx MSSA endocarditis/dyspnea on exertion - Previous evaluation by CT surgery 7/30 with recommendations for surgical correction.  Patient is not a candidate for TAVR.  Patient previously wanted to pursue conservative management however ongoing dyspnea leading to reconsideration of surgical correction.  Evaluated by CT surgery.  Plans to pursue valve replacement early next week.  Cardiac cath to be performed prior to surgical intervention, likely Monday 10/6.  Will make n.p.o. after midnight.   Acute kidney injury on CKD 3b-metabolic acidosis - Creatinine slightly improved from yesterday, however now with resolved acidemia.  Holding off on diuresis and cardiac cath for now.  Responded well to bicarb drip 500 mL  at 100 mL/hr. will reorder NS 500 mL at 100 mL/h.  Will continue to monitor urine output and repeat bmp in a.m. ideally will have kidney function improve as to not delay impending cardiac cath   Chronic HFrEF -Does not appear to be volume overloaded.  No edema appreciated.  No hypoxia.  Cardiology consulted and following closely.   Elevated troponin - Likely demand ischemia.  Troponins minimally elevated and flat.   Atrial fibrillation - Avoiding beta-blocker.  Not on AC despite CHA2DS2-VASc = 6.   CAD/PAD/HLD - Aspirin and statin on board.   Hypertension - Amlodipine  on board.  Holding beta-blockers.   Diabetes mellitus -insulin  sliding scale on board.   Physical debilitation muscle weakness - Patient is very  frail, limited by aortic insufficiency as above.  Will hold off on PT eval secondary to decision by CT surgery.     Subjective: Resting comfortably this morning.  States he did not sleep well again last night despite melatonin and trazodone.  Denies any fever, chills, chest pain, nausea, vomiting, abdominal pain.  Physical Exam:  Vitals:   11/04/23 2343 11/05/23 0348 11/05/23 0738 11/05/23 1151  BP: (!) 119/57 (!) 124/59 (!) 155/59 (!) 111/92  Pulse: 81 74 88 86  Resp: 18 18 16 16   Temp: 98.3 F (36.8 C) 98.1 F (36.7 C) 98.4 F (36.9 C) 98 F (36.7 C)  TempSrc: Oral Oral  Oral  SpO2: 99% 99% 99% 100%    GENERAL:  Alert, pleasant, no acute distress  HEENT:  EOMI CARDIOVASCULAR:  RRR, murmur appreciated RESPIRATORY:  Clear to auscultation, no wheezing, rales, or rhonchi GASTROINTESTINAL:  Soft, nontender, nondistended EXTREMITIES:  thin, No LE edema bilaterally NEURO:  No new focal deficits appreciated SKIN:  No rashes noted PSYCH:  Appropriate mood and affect    Data Reviewed:  Imaging Studies: VAS US  DOPPLER PRE CABG Result Date: 11/04/2023 PREOPERATIVE VASCULAR EVALUATION Patient Name:  URIAS SHEEK  Date of Exam:   11/04/2023 Medical Rec #: [id number]         Accession #:    [id number] Date of Birth: [birth year]        Patient Gender: M Patient Age:   89 years Exam Location:  [facility] Procedure:      VAS US  DOPPLER PRE CABG Referring Phys: BAILEY CHAMBERS --------------------------------------------------------------------------------  Indications:      Pre-CABG. Risk Factors:     Hypertension, hyperlipidemia. Comparison  Study: No prior studies. Performing Technologist: Gerome Ny RVT  Examination Guidelines: A complete evaluation includes B-mode imaging, spectral Doppler, color Doppler, and power Doppler as needed of all accessible portions of each vessel. Bilateral testing is considered an integral part of a complete examination. Limited examinations for  reoccurring indications may be performed as noted.  Right Carotid Findings: +----------+--------+-------+--------+--------------------------------+--------+           PSV cm/sEDV    StenosisDescribe                        Comments                   cm/s                                                    +----------+--------+-------+--------+--------------------------------+--------+ CCA Prox  151     18             smooth, heterogenous and                                                  calcific                                 +----------+--------+-------+--------+--------------------------------+--------+ CCA Distal90      12             smooth and heterogenous                  +----------+--------+-------+--------+--------------------------------+--------+ ICA Prox  85      17             irregular and heterogenous               +----------+--------+-------+--------+--------------------------------+--------+ ICA Mid   80      16                                             tortuous +----------+--------+-------+--------+--------------------------------+--------+ ICA Distal77      13                                             tortuous +----------+--------+-------+--------+--------------------------------+--------+ ECA       133     3                                                       +----------+--------+-------+--------+--------------------------------+--------+ +----------+--------+-------+--------+------------+           PSV cm/sEDV cmsDescribeArm Pressure +----------+--------+-------+--------+------------+ Subclavian84                                  +----------+--------+-------+--------+------------+ +---------+--------+--+--------+--+---------+ VertebralPSV cm/s49EDV cm/s12Antegrade +---------+--------+--+--------+--+---------+ Left Carotid Findings: +----------+--------+--------+--------+-----------------------+--------+  PSV cm/sEDV cm/sStenosisDescribe               Comments +----------+--------+--------+--------+-----------------------+--------+ CCA Prox  95      11                                              +----------+--------+--------+--------+-----------------------+--------+ CCA Distal110     9               smooth and heterogenous         +----------+--------+--------+--------+-----------------------+--------+ ICA Prox  128     20              smooth and heterogenous         +----------+--------+--------+--------+-----------------------+--------+ ICA Mid   132     21                                              +----------+--------+--------+--------+-----------------------+--------+ ICA Distal144     17                                     tortuous +----------+--------+--------+--------+-----------------------+--------+ ECA       103     1                                               +----------+--------+--------+--------+-----------------------+--------+  +----------+--------+--------+--------+------------+ SubclavianPSV cm/sEDV cm/sDescribeArm Pressure +----------+--------+--------+--------+------------+           92                                   +----------+--------+--------+--------+------------+ +---------+--------+--+--------+-+---------+ VertebralPSV cm/s55EDV cm/s7Antegrade +---------+--------+--+--------+-+---------+  ABI Findings: +------------------+-----+-----------+ Rt Pressure (mmHg)IndexWaveform    +------------------+-----+-----------+ 124                    triphasic   +------------------+-----+-----------+ 187               1.51 multiphasic +------------------+-----+-----------+ 161               1.30 multiphasic +------------------+-----+-----------+ +------------------+-----+----------+ Lt Pressure (mmHg)IndexWaveform   +------------------+-----+----------+ 118                    triphasic   +------------------+-----+----------+ 254               2.05 monophasic +------------------+-----+----------+ 58                0.47 monophasic +------------------+-----+----------+ +-------+---------------+ ABI/TBIToday's ABI/TBI +-------+---------------+ Right  1.51            +-------+---------------+ Left   0.47            +-------+---------------+  Right Doppler Findings: +--------+--------+---------+ Site    PressureDoppler   +--------+--------+---------+ [id number]     triphasic +--------+--------+---------+ Radial          triphasic +--------+--------+---------+ Ulnar           triphasic +--------+--------+---------+  Left Doppler Findings: +--------+--------+---------+ Site    PressureDoppler   +--------+--------+---------+ [id number]     triphasic +--------+--------+---------+  Radial          triphasic +--------+--------+---------+ Ulnar           triphasic +--------+--------+---------+   Summary: Right Carotid: Velocities in the right ICA are consistent with a 1-39% stenosis. Left Carotid: Velocities in the left ICA are consistent with a 1-39% stenosis. Vertebrals: Bilateral vertebral arteries demonstrate antegrade flow. Right ABI: Resting right ankle-brachial index indicates noncompressible right lower extremity arteries. Left ABI: Resting left ankle-brachial index indicates severe left lower extremity arterial disease. Right Upper Extremity: Doppler waveform obliterate with right radial compression. Doppler waveform obliterate with right ulnar compression. Left Upper Extremity: Doppler waveform obliterate with left radial compression. Doppler waveforms decrease >50% with left ulnar compression.     Preliminary    ECHOCARDIOGRAM COMPLETE Result Date: 11/03/2023    ECHOCARDIOGRAM REPORT   Patient Name:   RUAL VERMEER Date of Exam: 11/02/2023 Medical Rec #:  [id number]        Height:       71.0 in Accession #:    [id number]       Weight:       153.2  lb Date of Birth:  Jun 06, 1945       BSA:          1.883 m Patient Age:    63 years         BP:           112/64 mmHg Patient Gender: M                HR:           74 bpm. Exam Location:  Inpatient Procedure: 2D Echo, Cardiac Doppler and Color Doppler (Both Spectral and Color            Flow Doppler were utilized during procedure).                                 MODIFIED REPORT:  This report was modified by Vinie Maxcy MD on 11/03/2023 due to Re-evaluated           aortic insufficiency in light of more recent TEE comparison.  Indications:     Aortic regurgitation  History:         Patient has prior history of Echocardiogram examinations, most                  recent 04/21/2023. CHF, COPD, Signs/Symptoms:Shortness of                  Breath; Risk Factors:Hypertension and Former Smoker.  Sonographer:     Juliene Rucks Referring Phys:  [id number] ZANE ADAMS Diagnosing Phys: Vinie Maxcy MD  Sonographer Comments: Technically difficult study due to poor echo windows. Image acquisition challenging due to COPD and Image acquisition challenging due to respiratory motion. IMPRESSIONS  1. Technically difficult study, images off axis  2. Left ventricular ejection fraction, by estimation, is 60 to 65%. Left ventricular ejection fraction by PLAX is 61 %. The left ventricle has normal function. The left ventricle has no regional wall motion abnormalities. Left ventricular diastolic parameters are consistent with Grade I diastolic dysfunction (impaired relaxation).  3. Right ventricular systolic function is normal. The right ventricular size is normal.  4. The mitral valve is normal in structure. No evidence of mitral valve regurgitation.  5. Calcified aortic root. There appears to be eccentric, posteriorly directed regurgitation (worst on image 77). The aortic valve  was not well visualized. Aortic valve regurgitation is moderate. Aortic valve sclerosis/calcification is present, without any evidence of aortic stenosis.  Comparison(s): Changes from prior study are noted. 07/24/2023: LVEF 50-55%, severe AI with oscillating density on the left coronary cusp. FINDINGS  Left Ventricle: Left ventricular ejection fraction, by estimation, is 60 to 65%. Left ventricular ejection fraction by PLAX is 61 %. The left ventricle has normal function. The left ventricle has no regional wall motion abnormalities. The left ventricular internal cavity size was normal in size. There is no left ventricular hypertrophy. Left ventricular diastolic parameters are consistent with Grade I diastolic dysfunction (impaired relaxation). Indeterminate filling pressures. Right Ventricle: The right ventricular size is normal. No increase in right ventricular wall thickness. Right ventricular systolic function is normal. Left Atrium: Left atrial size was normal in size. Right Atrium: Right atrial size was normal in size. Pericardium: There is no evidence of pericardial effusion. Mitral Valve: The mitral valve is normal in structure. No evidence of mitral valve regurgitation. Tricuspid Valve: The tricuspid valve is not well visualized. Tricuspid valve regurgitation is not demonstrated. Aortic Valve: Calcified aortic root. There appears to be eccentric, posteriorly directed regurgitation (worst on image 77). The aortic valve was not well visualized. There is moderate aortic valve annular calcification. Aortic valve regurgitation is moderate. Aortic valve sclerosis/calcification is present, without any evidence of aortic stenosis. Pulmonic Valve: The pulmonic valve was normal in structure. Pulmonic valve regurgitation is not visualized. Aorta: The aortic root and ascending aorta are structurally normal, with no evidence of dilitation. Venous: The inferior vena cava was not well visualized. IAS/Shunts: The interatrial septum was not well visualized.  LEFT VENTRICLE PLAX 2D LV EF:         Left            Diastology                ventricular     LV e' medial:    4.97 cm/s                 ejection        LV E/e' medial:  7.8                fraction by     LV e' lateral:   7.77 cm/s                PLAX is 61      LV E/e' lateral: 5.0                %. LVIDd:         4.00 cm LVIDs:         2.70 cm LV PW:         1.10 cm LV IVS:        1.00 cm LVOT diam:     2.10 cm LVOT Area:     3.46 cm  RIGHT VENTRICLE RV Basal diam:  3.30 cm RV Mid diam:    3.00 cm RV S prime:     17.60 cm/s TAPSE (M-mode): 1.5 cm LEFT ATRIUM           Index        RIGHT ATRIUM           Index LA diam:      2.70 cm 1.43 cm/m   RA Area:     13.80 cm LA Vol (A4C): 37.3 ml 19.81 ml/m  RA Volume:   33.60 ml  17.85 ml/m  MITRAL VALVE MV Area (PHT): 2.83 cm    SHUNTS MV Decel Time: 268 msec    Systemic Diam: 2.10 cm MV E velocity: 38.80 cm/s MV A velocity: 63.40 cm/s MV E/A ratio:  0.61 Vinie Maxcy MD Electronically signed by Vinie Maxcy MD Signature Date/Time: 11/02/2023/2:12:15 PM    Final (Updated)    CT CHEST WO CONTRAST Result Date: 11/02/2023 CLINICAL DATA:  CABG workup short of breath EXAM: CT CHEST WITHOUT CONTRAST TECHNIQUE: Multidetector CT imaging of the chest was performed following the standard protocol without IV contrast. RADIATION DOSE REDUCTION: This exam was performed according to the departmental dose-optimization program which includes automated exposure control, adjustment of the mA and/or kV according to patient size and/or use of iterative reconstruction technique. COMPARISON:  Chest x-ray 11/01/2023, chest CT 07/17/2019 FINDINGS: Cardiovascular: Limited assessment without intravenous contrast. Advanced aortic atherosclerosis. Mild aneurysmal dilatation of the ascending aorta, measuring 4.1 cm maximum. Multi-vessel advanced coronary vascular calcification. Normal cardiac size. No pericardial effusion Mediastinum/Nodes: Patent trachea. No thyroid  mass. No suspicious lymph nodes. Esophagus within normal limits. Lungs/Pleura: Emphysema. Scarring within the left upper and lower lobes. Band  like density and thickening along the right posterior pleural surface with a few scattered calcifications suggestive of chronic scarring. Mild right lower lobe bronchiectasis. These findings are new when compared to lung bases from CT of September 2024. A few scattered punctate pulmonary nodules, largest seen in the left base and measures 3 mm on series 3, image 119. Upper Abdomen: No acute finding. Multiple renal cysts incompletely visualize, no specific imaging follow-up is recommended. Numerous pancreatic calcifications with pancreatic atrophy consistent with chronic pancreatitis. Musculoskeletal: No acute osseous abnormality. IMPRESSION: 1. Emphysema. Scarring within the left upper and lower lobes. Band like density and thickening along the right posterior pleural surface with a few scattered calcifications suggestive of chronic scarring but new compared with 2024. Mild right lower lobe bronchiectasis. 2. Few small punctate pulmonary nodules measuring up to 3 mm No follow-up needed if patient is low-risk (and has no known or suspected primary neoplasm). Non-contrast chest CT can be considered in 12 months if patient is high-risk. This recommendation follows the consensus statement: Guidelines for Management of Incidental Pulmonary Nodules Detected on CT Images: From the Fleischner Society 2017; Radiology 2017; 284:228-243. 3. Aortic atherosclerosis. Mild aneurysmal dilatation of ascending aorta up to 4.1 cm. Recommend annual imaging followup by CTA or MRA. This recommendation follows 2010 ACCF/AHA/AATS/ACR/ASA/SCA/SCAI/SIR/STS/SVM Guidelines for the Diagnosis and Management of Patients with Thoracic Aortic Disease. Circulation. 2010; 121: Z733-z630. Aortic aneurysm NOS (ICD10-I71.9) 4. Findings consistent with chronic pancreatitis Aortic Atherosclerosis (ICD10-I70.0) and Emphysema (ICD10-J43.9). Electronically Signed   By: Luke Bun M.D.   On: 11/02/2023 21:21   VAS US  LOWER EXTREMITY VENOUS (DVT) Result  Date: 11/02/2023  Lower Venous DVT Study Patient Name:  TAAHIR GRISBY  Date of Exam:   11/02/2023 Medical Rec #: [id number]         Accession #:    [id number] Date of Birth: 23-Jan-1946        Patient Gender: M Patient Age:   13 years Exam Location:  [facility] Procedure:      VAS US  LOWER EXTREMITY VENOUS (DVT) Referring Phys: EKTA PATEL --------------------------------------------------------------------------------  Indications: SOB, and Edema (Leg size one is larger than other).  Comparison Study: No previous exams Performing Technologist: Jody Hill RVT, RDMS  Examination Guidelines: A complete evaluation includes B-mode imaging, spectral Doppler, color Doppler, and power Doppler as needed of all accessible portions of  each vessel. Bilateral testing is considered an integral part of a complete examination. Limited examinations for reoccurring indications may be performed as noted. The reflux portion of the exam is performed with the patient in reverse Trendelenburg.  +---------+---------------+---------+-----------+----------+--------------+ RIGHT    CompressibilityPhasicitySpontaneityPropertiesThrombus Aging +---------+---------------+---------+-----------+----------+--------------+ CFV      Full           Yes      Yes                                 +---------+---------------+---------+-----------+----------+--------------+ SFJ      Full                                                        +---------+---------------+---------+-----------+----------+--------------+ FV Prox  Full           Yes      Yes                                 +---------+---------------+---------+-----------+----------+--------------+ FV Mid   Full           Yes      Yes                                 +---------+---------------+---------+-----------+----------+--------------+ FV DistalFull           Yes      Yes                                  +---------+---------------+---------+-----------+----------+--------------+ PFV      Full                                                        +---------+---------------+---------+-----------+----------+--------------+ POP      Full           Yes      Yes                                 +---------+---------------+---------+-----------+----------+--------------+ PTV      Full                                                        +---------+---------------+---------+-----------+----------+--------------+ PERO     Full                                                        +---------+---------------+---------+-----------+----------+--------------+   +---------+---------------+---------+-----------+----------+--------------+ LEFT     CompressibilityPhasicitySpontaneityPropertiesThrombus Aging +---------+---------------+---------+-----------+----------+--------------+ CFV      Full  Yes      Yes                                 +---------+---------------+---------+-----------+----------+--------------+ SFJ      Full                                                        +---------+---------------+---------+-----------+----------+--------------+ FV Prox  Full           Yes      Yes                                 +---------+---------------+---------+-----------+----------+--------------+ FV Mid   Full           Yes      Yes                                 +---------+---------------+---------+-----------+----------+--------------+ FV DistalFull           Yes      Yes                                 +---------+---------------+---------+-----------+----------+--------------+ PFV      Full                                                        +---------+---------------+---------+-----------+----------+--------------+ POP      Full           Yes      Yes                                  +---------+---------------+---------+-----------+----------+--------------+ PTV      Full                                                        +---------+---------------+---------+-----------+----------+--------------+ PERO     Full                                                        +---------+---------------+---------+-----------+----------+--------------+     Summary: BILATERAL: - No evidence of deep vein thrombosis seen in the lower extremities, bilaterally. -No evidence of popliteal cyst, bilaterally.   *See table(s) above for measurements and observations. Electronically signed by Debby Robertson on 11/02/2023 at 5:37:58 PM.    Final    DG Chest 2 View Result Date: 11/01/2023 CLINICAL DATA:  Dyspnea on exertion. EXAM: CHEST - 2 VIEW COMPARISON:  04/24/2023 and 04/20/2023 as well as 11/05/2022 FINDINGS: Lungs are adequately inflated without acute airspace consolidation or effusion. No  pneumothorax. Possible small nodular opacity over the right mid to lower lung. Cardiomediastinal silhouette and remainder of the exam is unchanged. IMPRESSION: 1. No acute cardiopulmonary disease. 2. Possible small nodular opacity over the right mid to lower lung. Recommend follow-up chest radiograph 3 months. If persistent, recommend noncontrast chest CT for further evaluation. Electronically Signed   By: Toribio Agreste M.D.   On: 11/01/2023 10:50    There are no new results to review at this time.  Previous records (including but not limited to H&P, progress notes, nursing notes, TOC management) were reviewed in assessment of this patient.  Labs: CBC: Recent Labs  Lab 11/01/23 1109 11/02/23 0515 11/03/23 0721 11/04/23 0653  WBC 8.4 7.9 10.3 7.7  NEUTROABS 5.9 5.3  --   --   HGB 13.1 12.2* 12.5* 11.7*  HCT 38.0* 36.2* 36.8* 35.7*  MCV 90.0 91.4 89.1 94.4  PLT 146* 145* 140* 107*   Basic Metabolic Panel: Recent Labs  Lab 11/02/23 1023 11/03/23 0418 11/04/23 0653 11/04/23 1648  11/05/23 0433  NA 132* 137 136 135 137  K 4.5 3.9 4.0 3.8 4.0  CL 105 106 110 105 104  CO2 15* 17* 15* 20* 21*  GLUCOSE 147* 135* 156* 192* 132*  BUN 51* 57* 54* 52* 49*  CREATININE 3.07* 3.28* 2.96* 2.98* 2.87*  CALCIUM  8.0* 8.3* 8.1* 8.4* 8.5*  MG 2.8* 2.7* 2.7*  --  2.8*  PHOS 4.6  --   --   --   --    Liver Function Tests: Recent Labs  Lab 11/01/23 1109 11/02/23 1023 11/03/23 0418  AST 29 21 19   ALT 10 12 11   ALKPHOS 101 77 70  BILITOT 0.5 1.0 0.7  PROT 6.9 6.2* 6.3*  ALBUMIN 3.7 3.2* 3.2*   CBG: Recent Labs  Lab 11/04/23 0808 11/04/23 1245 11/04/23 1627 11/04/23 2041 11/05/23 1152  GLUCAP 143* 232* 187* 111* 219*    Scheduled Meds:  amitriptyline   50 mg Oral QHS   amLODipine   5 mg Oral Daily   aspirin EC  81 mg Oral Daily   feeding supplement  237 mL Oral BID BM   heparin  injection (subcutaneous)  5,000 Units Subcutaneous Q12H   hydrALAZINE   50 mg Oral Q8H   insulin  aspart  0-15 Units Subcutaneous TID WC   melatonin  3 mg Oral QHS   pantoprazole   40 mg Oral Daily   polyethylene glycol  17 g Oral Daily   rOPINIRole   0.25 mg Oral TID   rosuvastatin   40 mg Oral Daily   Continuous Infusions:  sodium chloride  100 mL/hr at 11/05/23 0902   PRN Meds:.artificial tears, bisacodyl , ipratropium-albuterol , SUMAtriptan , zolpidem   Family Communication: None at bedside  Disposition: Status is: Inpatient Remains inpatient appropriate because: Cath tomorrow     Time spent: 33 minutes  Length of inpatient stay: 3 days  Author: Carliss LELON Canales, DO 11/05/2023 12:25 PM  For on call review [email or web address].

## 2023-11-06 ENCOUNTER — Encounter (HOSPITAL_COMMUNITY)
Admission: EM | Disposition: A | Discharge status: Other Acute Inpatient Hospital | Payer: Self-pay | Source: Home / Self Care

## 2023-11-06 ENCOUNTER — Encounter (HOSPITAL_COMMUNITY): Admission: EM | Disposition: A | Payer: Self-pay | Source: Home / Self Care

## 2023-11-06 DIAGNOSIS — N179 Acute kidney failure, unspecified: Secondary | ICD-10-CM | POA: Diagnosis not present

## 2023-11-06 DIAGNOSIS — N1832 Chronic kidney disease, stage 3b: Secondary | ICD-10-CM

## 2023-11-06 DIAGNOSIS — N189 Chronic kidney disease, unspecified: Secondary | ICD-10-CM | POA: Diagnosis not present

## 2023-11-06 DIAGNOSIS — I5033 Acute on chronic diastolic (congestive) heart failure: Secondary | ICD-10-CM

## 2023-11-06 DIAGNOSIS — I351 Nonrheumatic aortic (valve) insufficiency: Secondary | ICD-10-CM | POA: Diagnosis not present

## 2023-11-06 DIAGNOSIS — I2510 Atherosclerotic heart disease of native coronary artery without angina pectoris: Secondary | ICD-10-CM

## 2023-11-06 DIAGNOSIS — R0602 Shortness of breath: Secondary | ICD-10-CM | POA: Diagnosis not present

## 2023-11-06 HISTORY — PX: RIGHT/LEFT HEART CATH AND CORONARY ANGIOGRAPHY: CATH118266

## 2023-11-06 LAB — BASIC METABOLIC PANEL WITH GFR
Anion gap: 11 (ref 5–15)
BUN: 44 mg/dL — ABNORMAL HIGH (ref 8–23)
CO2: 20 mmol/L — ABNORMAL LOW (ref 22–32)
Calcium: 8.4 mg/dL — ABNORMAL LOW (ref 8.9–10.3)
Chloride: 108 mmol/L (ref 98–111)
Creatinine, Ser: 2.71 mg/dL — ABNORMAL HIGH (ref 0.61–1.24)
GFR, Estimated: 23 mL/min — ABNORMAL LOW (ref >60)
Glucose, Bld: 123 mg/dL — ABNORMAL HIGH (ref 70–99)
Potassium: 4.1 mmol/L (ref 3.5–5.1)
Sodium: 139 mmol/L (ref 135–145)

## 2023-11-06 LAB — POCT I-STAT 7, (LYTES, BLD GAS, ICA,H+H)
Acid-base deficit: 5 mmol/L — ABNORMAL HIGH (ref 0.0–2.0)
Bicarbonate: 19.3 mmol/L — ABNORMAL LOW (ref 20.0–28.0)
Calcium, Ion: 1.16 mmol/L (ref 1.15–1.40)
HCT: 27 % — ABNORMAL LOW (ref 39.0–52.0)
Hemoglobin: 9.2 g/dL — ABNORMAL LOW (ref 13.0–17.0)
O2 Saturation: 97 %
Potassium: 4.1 mmol/L (ref 3.5–5.1)
Sodium: 140 mmol/L (ref 135–145)
TCO2: 20 mmol/L — ABNORMAL LOW (ref 22–32)
pCO2 arterial: 30.6 mmHg — ABNORMAL LOW (ref 32–48)
pH, Arterial: 7.409 (ref 7.35–7.45)
pO2, Arterial: 89 mmHg (ref 83–108)

## 2023-11-06 LAB — POCT I-STAT EG7
Acid-base deficit: 4 mmol/L — ABNORMAL HIGH (ref 0.0–2.0)
Bicarbonate: 20.4 mmol/L (ref 20.0–28.0)
Calcium, Ion: 1.17 mmol/L (ref 1.15–1.40)
HCT: 27 % — ABNORMAL LOW (ref 39.0–52.0)
Hemoglobin: 9.2 g/dL — ABNORMAL LOW (ref 13.0–17.0)
O2 Saturation: 70 %
Potassium: 4.1 mmol/L (ref 3.5–5.1)
Sodium: 140 mmol/L (ref 135–145)
TCO2: 21 mmol/L — ABNORMAL LOW (ref 22–32)
pCO2, Ven: 33.5 mmHg — ABNORMAL LOW (ref 44–60)
pH, Ven: 7.393 (ref 7.25–7.43)
pO2, Ven: 36 mmHg (ref 32–45)

## 2023-11-06 LAB — GLUCOSE, CAPILLARY
Glucose-Capillary: 129 mg/dL — ABNORMAL HIGH (ref 70–99)
Glucose-Capillary: 134 mg/dL — ABNORMAL HIGH (ref 70–99)
Glucose-Capillary: 107 mg/dL — ABNORMAL HIGH (ref 70–99)
Glucose-Capillary: 154 mg/dL — ABNORMAL HIGH (ref 70–99)

## 2023-11-06 LAB — CBC
HCT: 31.1 % — ABNORMAL LOW (ref 39.0–52.0)
Hemoglobin: 10.6 g/dL — ABNORMAL LOW (ref 13.0–17.0)
MCH: 31 pg (ref 26.0–34.0)
MCHC: 34.1 g/dL (ref 30.0–36.0)
MCV: 90.9 fL (ref 80.0–100.0)
Platelets: 125 K/uL — ABNORMAL LOW (ref 150–400)
RBC: 3.42 MIL/uL — ABNORMAL LOW (ref 4.22–5.81)
RDW: 13.7 % (ref 11.5–15.5)
WBC: 6.7 K/uL (ref 4.0–10.5)
nRBC: 0 % (ref 0.0–0.2)

## 2023-11-06 LAB — CULTURE, BLOOD (ROUTINE X 2)
Culture: NO GROWTH
Culture: NO GROWTH
Special Requests: ADEQUATE
Special Requests: ADEQUATE

## 2023-11-06 LAB — MAGNESIUM: Magnesium: 2.7 mg/dL — ABNORMAL HIGH (ref 1.7–2.4)

## 2023-11-06 LAB — VAS US DOPPLER PRE CABG
Left ABI: 0.47
Right ABI: 1.51

## 2023-11-06 SURGERY — REPLACEMENT, AORTIC VALVE, OPEN
Anesthesia: General | Site: Chest

## 2023-11-06 MED ORDER — HEPARIN SODIUM (PORCINE) 1000 UNIT/ML IJ SOLN
INTRAMUSCULAR | Status: DC | PRN
  Administered 2023-11-06: 3000 [IU] via INTRAVENOUS

## 2023-11-06 MED ORDER — VERAPAMIL HCL 2.5 MG/ML IV SOLN
INTRAVENOUS | Status: DC | PRN
  Administered 2023-11-06: 10 mL via INTRA_ARTERIAL

## 2023-11-06 MED ORDER — FENTANYL CITRATE (PF) 100 MCG/2ML IJ SOLN
INTRAMUSCULAR | Status: AC
  Filled 2023-11-06: qty 2

## 2023-11-06 MED ORDER — VERAPAMIL HCL 2.5 MG/ML IV SOLN
INTRAVENOUS | Status: AC
  Filled 2023-11-06: qty 2

## 2023-11-06 MED ORDER — LIDOCAINE HCL (PF) 1 % IJ SOLN
INTRAMUSCULAR | Status: AC
  Filled 2023-11-06: qty 30

## 2023-11-06 MED ORDER — MIDAZOLAM HCL 2 MG/2ML IJ SOLN
INTRAMUSCULAR | Status: DC | PRN
  Administered 2023-11-06: 1 mg via INTRAVENOUS

## 2023-11-06 MED ORDER — HEPARIN SODIUM (PORCINE) 1000 UNIT/ML IJ SOLN
INTRAMUSCULAR | Status: AC
  Filled 2023-11-06: qty 10

## 2023-11-06 MED ORDER — IODIXANOL 320 MG/ML IV SOLN
INTRAVENOUS | Status: DC | PRN
  Administered 2023-11-06: 30 mL

## 2023-11-06 MED ORDER — OXYCODONE HCL 5 MG PO TABS
5.0000 mg | ORAL_TABLET | Freq: Once | ORAL | Status: AC
  Administered 2023-11-06: 5 mg via ORAL
  Filled 2023-11-06: qty 1

## 2023-11-06 MED ORDER — SODIUM CHLORIDE 0.9 % IV BOLUS
500.0000 mL | Freq: Once | INTRAVENOUS | Status: AC
  Administered 2023-11-06: 500 mL via INTRAVENOUS

## 2023-11-06 MED ORDER — MIDAZOLAM HCL 2 MG/2ML IJ SOLN
INTRAMUSCULAR | Status: AC
  Filled 2023-11-06: qty 2

## 2023-11-06 MED ORDER — LIDOCAINE HCL (PF) 1 % IJ SOLN
INTRAMUSCULAR | Status: DC | PRN
  Administered 2023-11-06: 2 mL
  Administered 2023-11-06: 5 mL

## 2023-11-06 MED ORDER — SODIUM CHLORIDE 0.9 % IV BOLUS
500.0000 mL | Freq: Once | INTRAVENOUS | Status: DC
Start: 2023-11-06 — End: 2023-11-08

## 2023-11-06 MED ORDER — FENTANYL CITRATE (PF) 100 MCG/2ML IJ SOLN
INTRAMUSCULAR | Status: DC | PRN
  Administered 2023-11-06: 25 ug via INTRAVENOUS

## 2023-11-06 MED ORDER — HEPARIN (PORCINE) IN NACL 1000-0.9 UT/500ML-% IV SOLN
INTRAVENOUS | Status: DC | PRN
  Administered 2023-11-06: 1000 mL

## 2023-11-06 NOTE — Progress Notes (Signed)
 Cardiology Progress Note  Patient ID: Michael Bender MRN: [id number] DOB: [birth year] Date of Encounter: 11/06/2023 Primary Cardiologist: Vina Gull, MD  Subjective   Chief Complaint: None.   HPI: Denies chest pain or trouble breathing.  Creatinine coming down.  Euvolemic on exam.  ROS:  All other ROS reviewed and negative. Pertinent positives noted in the HPI.     Telemetry  Overnight telemetry shows sinus rhythm 80s, which I personally reviewed.    Physical Exam   Vitals:   11/06/23 0002 11/06/23 0448 11/06/23 0500 11/06/23 0738  BP: (!) 132/48  121/62 (!) 144/60  Pulse: 87  81 88  Resp: 20  16 18   Temp: (!) 97.4 F (36.3 C)  (!) 97.5 F (36.4 C) 97.9 F (36.6 C)  TempSrc: Oral   Oral  SpO2: 100%  100% 98%  Weight:  62.1 kg    Height:  5' 11 (1.803 m)      Intake/Output Summary (Last 24 hours) at 11/06/2023 0858 Last data filed at 11/06/2023 0002 Gross per 24 hour  Intake 820 ml  Output 800 ml  Net 20 ml       11/06/2023    4:48 AM 10/12/2023   11:06 AM 10/10/2023    9:00 AM  Last 3 Weights  Weight (lbs) 136 lb 14.4 oz 153 lb 3.2 oz 153 lb  Weight (kg) 62.097 kg 69.491 kg 69.4 kg    Body mass index is 19.09 kg/m.  General: Well nourished, well developed, in no acute distress Head: Atraumatic, normal size  Eyes: PEERLA, EOMI  Neck: Supple, no JVD Endocrine: No thryomegaly Cardiac: Normal S1, S2; RRR; faint diastolic murmur Lungs: Clear to auscultation bilaterally, no wheezing, rhonchi or rales  Abd: Soft, nontender, no hepatomegaly  Ext: No edema, pulses 2+ Musculoskeletal: No deformities, BUE and BLE strength normal and equal Skin: Warm and dry, no rashes   Neuro: Alert and oriented to person, place, time, and situation, CNII-XII grossly intact, no focal deficits  Psych: Normal mood and affect   Cardiac Studies  TTE 11/03/2023  1. Technically difficult study, images off axis   2. Left ventricular ejection fraction, by estimation, is 60 to 65%. Left   ventricular ejection fraction by PLAX is 61 %. The left ventricle has  normal function. The left ventricle has no regional wall motion  abnormalities. Left ventricular diastolic  parameters are consistent with Grade I diastolic dysfunction (impaired  relaxation).   3. Right ventricular systolic function is normal. The right ventricular  size is normal.   4. The mitral valve is normal in structure. No evidence of mitral valve  regurgitation.   5. Calcified aortic root. There appears to be eccentric, posteriorly  directed regurgitation (worst on image 77). The aortic valve was not well  visualized. Aortic valve regurgitation is moderate. Aortic valve  sclerosis/calcification is present, without  any evidence of aortic stenosis.   Patient Profile  Michael Bender is a 78 y.o. male with CKD 3B, severe aortic regurgitation (prior endocarditis), diastolic heart failure, COPD, CVA, diabetes, hypertension admitted on 11/01/2023 with acute on chronic diastolic heart failure and AKI.  Assessment & Plan   # Acute on chronic diastolic heart failure # Severe aortic regurgitation - Admitted with worsening shortness of breath.  Known history of severe aortic regurgitation.  Interestingly his LV is not dilated. - Cardiac MRI suggestive of severe AI.  TEE suggestive of moderate-severe AI.  Evaluated by surgery and cardiology this admission.  They feel his valve  needs to be fixed. - No further IV diuresis needed. - We will give him a 500 cc bolus to see if this can help his kidney function.  Creatinine is coming down.  Can proceed to left heart cath today. - CT surgery with plan for aortic valve replacement this admission.  Informed Consent   Shared Decision Making/Informed Consent The risks [stroke (1 in 1000), death (1 in 1000), kidney failure [usually temporary] (1 in 500), bleeding (1 in 200), allergic reaction [possibly serious] (1 in 200)], benefits (diagnostic support and management of coronary  artery disease) and alternatives of a cardiac catheterization were discussed in detail with Mr. Minix and he is willing to proceed.    # AKI # CKD 3b - Creatinine coming down.  Okay for left heart today.  We will give a 500 cc bolus.  He does not appear volume overloaded.  # Elevated troponin, demand - Do not suspect ACS.  Likely related to heart failure.  # CVA - on ASA 81 mg daily, Crestor  40 mg daily  # HTN - Home Norvasc  5 mg daily hydralazine  50 mg 3 times daily     For questions or updates, please contact [facility] Please consult [email or web address] for contact info under        Signed, Darryle T. Barbaraann, MD, [facility] [place]  [facility]  11/06/2023 8:58 AM

## 2023-11-06 NOTE — Progress Notes (Signed)
 Progress Note   Patient: Michael Bender FMW:[id number] DOB: 10-20-1945 DOA: 11/01/2023  DOS: the patient was seen and examined on 11/06/2023   Brief hospital course:  78 y.o. 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   Assessment and Plan:   Severe aortic insufficiency/Hx MSSA endocarditis/dyspnea on exertion - Previous evaluation by CT surgery 7/30 with recommendations for surgical correction.  Patient is not a candidate for TAVR.  Patient previously wanted to pursue conservative management however ongoing dyspnea leading to reconsideration of surgical correction.  Evaluated by CT surgery.  Plans to pursue valve replacement early next week.  Cardiac cath to be performed prior to surgical intervention, likely later today.    Acute kidney injury on CKD 3b - Creatinine slightly improved from yesterday, however now with resolved acidemia.  Responded well to IV fluids the last couple days.  Will continue to monitor urine output and repeat bmp in a.m. ideally will have kidney function improve as to not delay impending cardiac cath   Chronic HFrEF -Does not appear to be volume overloaded.  No edema appreciated.  No hypoxia.  Cardiology consulted and following closely.   Elevated troponin - Likely demand ischemia.  Troponins minimally elevated and flat.   Atrial fibrillation - Avoiding beta-blocker.  Not on AC despite CHA2DS2-VASc = 6.   CAD/PAD/HLD - Aspirin and statin on board.   Hypertension - Amlodipine  on board.  Holding beta-blockers.   Diabetes mellitus -insulin  sliding scale on board.   Physical debilitation muscle weakness - Patient is very frail, limited by aortic insufficiency as above.  Will hold off on PT eval secondary to decision by CT surgery.   Subjective: Resting  comfortably this morning.  Denies any fever, chills, chest pain, nausea, vomiting, abdominal pain.   Physical Exam:  Vitals:   11/06/23 0002 11/06/23 0448 11/06/23 0500 11/06/23 0738  BP: (!) 132/48  121/62 (!) 144/60  Pulse: 87  81 88  Resp: 20  16 18   Temp: (!) 97.4 F (36.3 C)  (!) 97.5 F (36.4 C) 97.9 F (36.6 C)  TempSrc: Oral   Oral  SpO2: 100%  100% 98%  Weight:  62.1 kg    Height:  5' 11 (1.803 m)      GENERAL:  Alert, pleasant, no acute distress  HEENT:  EOMI CARDIOVASCULAR:  RRR, murmur appreciated RESPIRATORY:  Clear to auscultation, no wheezing, rales, or rhonchi GASTROINTESTINAL:  Soft, nontender, nondistended EXTREMITIES:  thin, No LE edema bilaterally NEURO:  No new focal deficits appreciated SKIN:  No rashes noted PSYCH:  Appropriate mood and affect    Data Reviewed:  Imaging Studies: VAS US  DOPPLER PRE CABG Result Date: 11/06/2023 PREOPERATIVE VASCULAR EVALUATION Patient Name:  Michael Bender  Date of Exam:   11/04/2023 Medical Rec #: [id number]         Accession #:    [id number] Date of Birth: April 09, 1945        Patient Gender: M Patient Age:   80 years Exam Location:  [facility] Procedure:      VAS US  DOPPLER PRE CABG Referring Phys: BAILEY CHAMBERS --------------------------------------------------------------------------------  Indications:      Pre-CABG. Risk Factors:     Hypertension, hyperlipidemia. Comparison Study: No prior studies. Performing Technologist: Gerome Ny RVT  Examination Guidelines: A complete evaluation includes B-mode imaging, spectral Doppler, color Doppler, and power Doppler as needed of  all accessible portions of each vessel. Bilateral testing is considered an integral part of a complete examination. Limited examinations for reoccurring indications may be performed as noted.  Right Carotid Findings: +----------+--------+-------+--------+--------------------------------+--------+           PSV cm/sEDV    StenosisDescribe                         Comments                   cm/s                                                    +----------+--------+-------+--------+--------------------------------+--------+ CCA Prox  151     18             smooth, heterogenous and                                                  calcific                                 +----------+--------+-------+--------+--------------------------------+--------+ CCA Distal90      12             smooth and heterogenous                  +----------+--------+-------+--------+--------------------------------+--------+ ICA Prox  85      17             irregular and heterogenous               +----------+--------+-------+--------+--------------------------------+--------+ ICA Mid   80      16                                             tortuous +----------+--------+-------+--------+--------------------------------+--------+ ICA Distal77      13                                             tortuous +----------+--------+-------+--------+--------------------------------+--------+ ECA       133     3                                                       +----------+--------+-------+--------+--------------------------------+--------+ +----------+--------+-------+--------+------------+           PSV cm/sEDV cmsDescribeArm Pressure +----------+--------+-------+--------+------------+ Subclavian84                                  +----------+--------+-------+--------+------------+ +---------+--------+--+--------+--+---------+ VertebralPSV cm/s49EDV cm/s12Antegrade +---------+--------+--+--------+--+---------+ Left Carotid Findings: +----------+--------+--------+--------+-----------------------+--------+           PSV cm/sEDV cm/sStenosisDescribe               Comments +----------+--------+--------+--------+-----------------------+--------+  CCA Prox  95      11                                               +----------+--------+--------+--------+-----------------------+--------+ CCA Distal110     9               smooth and heterogenous         +----------+--------+--------+--------+-----------------------+--------+ ICA Prox  128     20              smooth and heterogenous         +----------+--------+--------+--------+-----------------------+--------+ ICA Mid   132     21                                              +----------+--------+--------+--------+-----------------------+--------+ ICA Distal144     17                                     tortuous +----------+--------+--------+--------+-----------------------+--------+ ECA       103     1                                               +----------+--------+--------+--------+-----------------------+--------+ +----------+--------+--------+--------+------------+ SubclavianPSV cm/sEDV cm/sDescribeArm Pressure +----------+--------+--------+--------+------------+           92                                   +----------+--------+--------+--------+------------+ +---------+--------+--+--------+-+---------+ VertebralPSV cm/s55EDV cm/s7Antegrade +---------+--------+--+--------+-+---------+  ABI Findings: +------------------+-----+-----------+ Rt Pressure (mmHg)IndexWaveform    +------------------+-----+-----------+ 124                    triphasic   +------------------+-----+-----------+ 187               1.51 multiphasic +------------------+-----+-----------+ 161               1.30 multiphasic +------------------+-----+-----------+ +------------------+-----+----------+ Lt Pressure (mmHg)IndexWaveform   +------------------+-----+----------+ 118                    triphasic  +------------------+-----+----------+ 254               2.05 monophasic +------------------+-----+----------+ 58                0.47 monophasic +------------------+-----+----------+ +-------+---------------+  ABI/TBIToday's ABI/TBI +-------+---------------+ Right  1.51            +-------+---------------+ Left   0.47            +-------+---------------+  Right Doppler Findings: +--------+--------+---------+ Site    PressureDoppler   +--------+--------+---------+ [id number]     triphasic +--------+--------+---------+ Radial          triphasic +--------+--------+---------+ Ulnar           triphasic +--------+--------+---------+  Left Doppler Findings: +--------+--------+---------+ Site    PressureDoppler   +--------+--------+---------+ [id number]     triphasic +--------+--------+---------+ Radial          triphasic +--------+--------+---------+ Ulnar  triphasic +--------+--------+---------+   Summary: Right Carotid: Velocities in the right ICA are consistent with a 1-39% stenosis. Left Carotid: Velocities in the left ICA are consistent with a 1-39% stenosis. Vertebrals: Bilateral vertebral arteries demonstrate antegrade flow. Right ABI: Resting right ankle-brachial index indicates noncompressible right lower extremity arteries. Left ABI: Resting left ankle-brachial index indicates severe left lower extremity arterial disease. Right Upper Extremity: Doppler waveform obliterate with right radial compression. Doppler waveform obliterate with right ulnar compression. Left Upper Extremity: Doppler waveform obliterate with left radial compression. Doppler waveforms decrease >50% with left ulnar compression.  Electronically signed by Gaile New MD on 11/06/2023 at 8:16:53 AM.    Final    ECHOCARDIOGRAM COMPLETE Result Date: 11/03/2023    ECHOCARDIOGRAM REPORT   Patient Name:   Michael Bender Date of Exam: 11/02/2023 Medical Rec #:  [id number]        Height:       71.0 in Accession #:    [id number]       Weight:       153.2 lb Date of Birth:  Oct 25, 1945       BSA:          1.883 m Patient Age:    2 years         BP:           112/64 mmHg Patient Gender: M                 HR:           74 bpm. Exam Location:  Inpatient Procedure: 2D Echo, Cardiac Doppler and Color Doppler (Both Spectral and Color            Flow Doppler were utilized during procedure).                                 MODIFIED REPORT:  This report was modified by Vinie Maxcy MD on 11/03/2023 due to Re-evaluated           aortic insufficiency in light of more recent TEE comparison.  Indications:     Aortic regurgitation  History:         Patient has prior history of Echocardiogram examinations, most                  recent 04/21/2023. CHF, COPD, Signs/Symptoms:Shortness of                  Breath; Risk Factors:Hypertension and Former Smoker.  Sonographer:     Juliene Rucks Referring Phys:  [id number] ZANE ADAMS Diagnosing Phys: Vinie Maxcy MD  Sonographer Comments: Technically difficult study due to poor echo windows. Image acquisition challenging due to COPD and Image acquisition challenging due to respiratory motion. IMPRESSIONS  1. Technically difficult study, images off axis  2. Left ventricular ejection fraction, by estimation, is 60 to 65%. Left ventricular ejection fraction by PLAX is 61 %. The left ventricle has normal function. The left ventricle has no regional wall motion abnormalities. Left ventricular diastolic parameters are consistent with Grade I diastolic dysfunction (impaired relaxation).  3. Right ventricular systolic function is normal. The right ventricular size is normal.  4. The mitral valve is normal in structure. No evidence of mitral valve regurgitation.  5. Calcified aortic root. There appears to be eccentric, posteriorly directed regurgitation (worst on image 77). The aortic valve was not well visualized. Aortic valve regurgitation is moderate. Aortic valve sclerosis/calcification  is present, without any evidence of aortic stenosis. Comparison(s): Changes from prior study are noted. 07/24/2023: LVEF 50-55%, severe AI with oscillating density on the left coronary cusp. FINDINGS  Left Ventricle:  Left ventricular ejection fraction, by estimation, is 60 to 65%. Left ventricular ejection fraction by PLAX is 61 %. The left ventricle has normal function. The left ventricle has no regional wall motion abnormalities. The left ventricular internal cavity size was normal in size. There is no left ventricular hypertrophy. Left ventricular diastolic parameters are consistent with Grade I diastolic dysfunction (impaired relaxation). Indeterminate filling pressures. Right Ventricle: The right ventricular size is normal. No increase in right ventricular wall thickness. Right ventricular systolic function is normal. Left Atrium: Left atrial size was normal in size. Right Atrium: Right atrial size was normal in size. Pericardium: There is no evidence of pericardial effusion. Mitral Valve: The mitral valve is normal in structure. No evidence of mitral valve regurgitation. Tricuspid Valve: The tricuspid valve is not well visualized. Tricuspid valve regurgitation is not demonstrated. Aortic Valve: Calcified aortic root. There appears to be eccentric, posteriorly directed regurgitation (worst on image 77). The aortic valve was not well visualized. There is moderate aortic valve annular calcification. Aortic valve regurgitation is moderate. Aortic valve sclerosis/calcification is present, without any evidence of aortic stenosis. Pulmonic Valve: The pulmonic valve was normal in structure. Pulmonic valve regurgitation is not visualized. Aorta: The aortic root and ascending aorta are structurally normal, with no evidence of dilitation. Venous: The inferior vena cava was not well visualized. IAS/Shunts: The interatrial septum was not well visualized.  LEFT VENTRICLE PLAX 2D LV EF:         Left            Diastology                ventricular     LV e' medial:    4.97 cm/s                ejection        LV E/e' medial:  7.8                fraction by     LV e' lateral:   7.77 cm/s                PLAX is 61      LV E/e' lateral: 5.0                 %. LVIDd:         4.00 cm LVIDs:         2.70 cm LV PW:         1.10 cm LV IVS:        1.00 cm LVOT diam:     2.10 cm LVOT Area:     3.46 cm  RIGHT VENTRICLE RV Basal diam:  3.30 cm RV Mid diam:    3.00 cm RV S prime:     17.60 cm/s TAPSE (M-mode): 1.5 cm LEFT ATRIUM           Index        RIGHT ATRIUM           Index LA diam:      2.70 cm 1.43 cm/m   RA Area:     13.80 cm LA Vol (A4C): 37.3 ml 19.81 ml/m  RA Volume:   33.60 ml  17.85 ml/m  MITRAL VALVE MV Area (PHT): 2.83 cm    SHUNTS  MV Decel Time: 268 msec    Systemic Diam: 2.10 cm MV E velocity: 38.80 cm/s MV A velocity: 63.40 cm/s MV E/A ratio:  0.61 Vinie Maxcy MD Electronically signed by Vinie Maxcy MD Signature Date/Time: 11/02/2023/2:12:15 PM    Final (Updated)    CT CHEST WO CONTRAST Result Date: 11/02/2023 CLINICAL DATA:  CABG workup short of breath EXAM: CT CHEST WITHOUT CONTRAST TECHNIQUE: Multidetector CT imaging of the chest was performed following the standard protocol without IV contrast. RADIATION DOSE REDUCTION: This exam was performed according to the departmental dose-optimization program which includes automated exposure control, adjustment of the mA and/or kV according to patient size and/or use of iterative reconstruction technique. COMPARISON:  Chest x-ray 11/01/2023, chest CT 07/17/2019 FINDINGS: Cardiovascular: Limited assessment without intravenous contrast. Advanced aortic atherosclerosis. Mild aneurysmal dilatation of the ascending aorta, measuring 4.1 cm maximum. Multi-vessel advanced coronary vascular calcification. Normal cardiac size. No pericardial effusion Mediastinum/Nodes: Patent trachea. No thyroid  mass. No suspicious lymph nodes. Esophagus within normal limits. Lungs/Pleura: Emphysema. Scarring within the left upper and lower lobes. Band like density and thickening along the right posterior pleural surface with a few scattered calcifications suggestive of chronic scarring. Mild right lower lobe  bronchiectasis. These findings are new when compared to lung bases from CT of September 2024. A few scattered punctate pulmonary nodules, largest seen in the left base and measures 3 mm on series 3, image 119. Upper Abdomen: No acute finding. Multiple renal cysts incompletely visualize, no specific imaging follow-up is recommended. Numerous pancreatic calcifications with pancreatic atrophy consistent with chronic pancreatitis. Musculoskeletal: No acute osseous abnormality. IMPRESSION: 1. Emphysema. Scarring within the left upper and lower lobes. Band like density and thickening along the right posterior pleural surface with a few scattered calcifications suggestive of chronic scarring but new compared with 2024. Mild right lower lobe bronchiectasis. 2. Few small punctate pulmonary nodules measuring up to 3 mm No follow-up needed if patient is low-risk (and has no known or suspected primary neoplasm). Non-contrast chest CT can be considered in 12 months if patient is high-risk. This recommendation follows the consensus statement: Guidelines for Management of Incidental Pulmonary Nodules Detected on CT Images: From the Fleischner Society 2017; Radiology 2017; 284:228-243. 3. Aortic atherosclerosis. Mild aneurysmal dilatation of ascending aorta up to 4.1 cm. Recommend annual imaging followup by CTA or MRA. This recommendation follows 2010 ACCF/AHA/AATS/ACR/ASA/SCA/SCAI/SIR/STS/SVM Guidelines for the Diagnosis and Management of Patients with Thoracic Aortic Disease. Circulation. 2010; 121: Z733-z630. Aortic aneurysm NOS (ICD10-I71.9) 4. Findings consistent with chronic pancreatitis Aortic Atherosclerosis (ICD10-I70.0) and Emphysema (ICD10-J43.9). Electronically Signed   By: Luke Bun M.D.   On: 11/02/2023 21:21   VAS US  LOWER EXTREMITY VENOUS (DVT) Result Date: 11/02/2023  Lower Venous DVT Study Patient Name:  AVYUKTH BONTEMPO  Date of Exam:   11/02/2023 Medical Rec #: [id number]         Accession #:    [id number]  Date of Birth: 03/12/1945        Patient Gender: M Patient Age:   44 years Exam Location:  [facility] Procedure:      VAS US  LOWER EXTREMITY VENOUS (DVT) Referring Phys: EKTA PATEL --------------------------------------------------------------------------------  Indications: SOB, and Edema (Leg size one is larger than other).  Comparison Study: No previous exams Performing Technologist: Jody Hill RVT, RDMS  Examination Guidelines: A complete evaluation includes B-mode imaging, spectral Doppler, color Doppler, and power Doppler as needed of all accessible portions of each vessel. Bilateral testing is considered an integral part of a  complete examination. Limited examinations for reoccurring indications may be performed as noted. The reflux portion of the exam is performed with the patient in reverse Trendelenburg.  +---------+---------------+---------+-----------+----------+--------------+ RIGHT    CompressibilityPhasicitySpontaneityPropertiesThrombus Aging +---------+---------------+---------+-----------+----------+--------------+ CFV      Full           Yes      Yes                                 +---------+---------------+---------+-----------+----------+--------------+ SFJ      Full                                                        +---------+---------------+---------+-----------+----------+--------------+ FV Prox  Full           Yes      Yes                                 +---------+---------------+---------+-----------+----------+--------------+ FV Mid   Full           Yes      Yes                                 +---------+---------------+---------+-----------+----------+--------------+ FV DistalFull           Yes      Yes                                 +---------+---------------+---------+-----------+----------+--------------+ PFV      Full                                                         +---------+---------------+---------+-----------+----------+--------------+ POP      Full           Yes      Yes                                 +---------+---------------+---------+-----------+----------+--------------+ PTV      Full                                                        +---------+---------------+---------+-----------+----------+--------------+ PERO     Full                                                        +---------+---------------+---------+-----------+----------+--------------+   +---------+---------------+---------+-----------+----------+--------------+ LEFT     CompressibilityPhasicitySpontaneityPropertiesThrombus Aging +---------+---------------+---------+-----------+----------+--------------+ CFV      Full           Yes      Yes                                 +---------+---------------+---------+-----------+----------+--------------+  SFJ      Full                                                        +---------+---------------+---------+-----------+----------+--------------+ FV Prox  Full           Yes      Yes                                 +---------+---------------+---------+-----------+----------+--------------+ FV Mid   Full           Yes      Yes                                 +---------+---------------+---------+-----------+----------+--------------+ FV DistalFull           Yes      Yes                                 +---------+---------------+---------+-----------+----------+--------------+ PFV      Full                                                        +---------+---------------+---------+-----------+----------+--------------+ POP      Full           Yes      Yes                                 +---------+---------------+---------+-----------+----------+--------------+ PTV      Full                                                         +---------+---------------+---------+-----------+----------+--------------+ PERO     Full                                                        +---------+---------------+---------+-----------+----------+--------------+     Summary: BILATERAL: - No evidence of deep vein thrombosis seen in the lower extremities, bilaterally. -No evidence of popliteal cyst, bilaterally.   *See table(s) above for measurements and observations. Electronically signed by Debby Robertson on 11/02/2023 at 5:37:58 PM.    Final    DG Chest 2 View Result Date: 11/01/2023 CLINICAL DATA:  Dyspnea on exertion. EXAM: CHEST - 2 VIEW COMPARISON:  04/24/2023 and 04/20/2023 as well as 11/05/2022 FINDINGS: Lungs are adequately inflated without acute airspace consolidation or effusion. No pneumothorax. Possible small nodular opacity over the right mid to lower lung. Cardiomediastinal silhouette and remainder of the exam is unchanged. IMPRESSION: 1. No acute cardiopulmonary disease. 2. Possible small nodular opacity over the right mid to lower lung. Recommend  follow-up chest radiograph 3 months. If persistent, recommend noncontrast chest CT for further evaluation. Electronically Signed   By: Toribio Agreste M.D.   On: 11/01/2023 10:50    There are no new results to review at this time.  Previous records (including but not limited to H&P, progress notes, nursing notes, TOC management) were reviewed in assessment of this patient.  Labs: CBC: Recent Labs  Lab 11/01/23 1109 11/02/23 0515 11/03/23 0721 11/04/23 0653 11/06/23 0442  WBC 8.4 7.9 10.3 7.7 6.7  NEUTROABS 5.9 5.3  --   --   --   HGB 13.1 12.2* 12.5* 11.7* 10.6*  HCT 38.0* 36.2* 36.8* 35.7* 31.1*  MCV 90.0 91.4 89.1 94.4 90.9  PLT 146* 145* 140* 107* 125*   Basic Metabolic Panel: Recent Labs  Lab 11/02/23 1023 11/03/23 0418 11/04/23 0653 11/04/23 1648 11/05/23 0433 11/06/23 0442  NA 132* 137 136 135 137 139  K 4.5 3.9 4.0 3.8 4.0 4.1  CL 105 106 110 105 104  108  CO2 15* 17* 15* 20* 21* 20*  GLUCOSE 147* 135* 156* 192* 132* 123*  BUN 51* 57* 54* 52* 49* 44*  CREATININE 3.07* 3.28* 2.96* 2.98* 2.87* 2.71*  CALCIUM  8.0* 8.3* 8.1* 8.4* 8.5* 8.4*  MG 2.8* 2.7* 2.7*  --  2.8* 2.7*  PHOS 4.6  --   --   --   --   --    Liver Function Tests: Recent Labs  Lab 11/01/23 1109 11/02/23 1023 11/03/23 0418  AST 29 21 19   ALT 10 12 11   ALKPHOS 101 77 70  BILITOT 0.5 1.0 0.7  PROT 6.9 6.2* 6.3*  ALBUMIN 3.7 3.2* 3.2*   CBG: Recent Labs  Lab 11/05/23 1152 11/05/23 1606 11/05/23 1713 11/05/23 2057 11/06/23 0735  GLUCAP 219* 135* 132* 243* 134*    Scheduled Meds:  amitriptyline   50 mg Oral QHS   amLODipine   5 mg Oral Daily   aspirin EC  81 mg Oral Daily   feeding supplement  237 mL Oral BID BM   heparin  injection (subcutaneous)  5,000 Units Subcutaneous Q12H   hydrALAZINE   50 mg Oral Q8H   insulin  aspart  0-15 Units Subcutaneous TID WC   melatonin  3 mg Oral QHS   pantoprazole   40 mg Oral Daily   polyethylene glycol  17 g Oral Daily   rOPINIRole   0.25 mg Oral TID   rosuvastatin   40 mg Oral Daily   Continuous Infusions:  sodium chloride  10 mL/hr at 11/06/23 9371   sodium chloride      PRN Meds:.artificial tears, bisacodyl , ipratropium-albuterol , SUMAtriptan , zolpidem   Family Communication: none at bedside  Disposition: Status is: Inpatient Remains inpatient appropriate because: cath today     Time spent: 35 minutes  Length of inpatient stay: 4 days  Author: Carliss LELON Canales, DO 11/06/2023 11:17 AM  For on call review [email or web address].

## 2023-11-06 NOTE — Plan of Care (Signed)

## 2023-11-06 NOTE — H&P (View-Only) (Signed)
 Cardiology Progress Note  Patient ID: ODIN MARIANI MRN: [id number] DOB: [birth year] Date of Encounter: 11/06/2023 Primary Cardiologist: Vina Gull, MD  Subjective   Chief Complaint: None.   HPI: Denies chest pain or trouble breathing.  Creatinine coming down.  Euvolemic on exam.  ROS:  All other ROS reviewed and negative. Pertinent positives noted in the HPI.     Telemetry  Overnight telemetry shows sinus rhythm 80s, which I personally reviewed.    Physical Exam   Vitals:   11/06/23 0002 11/06/23 0448 11/06/23 0500 11/06/23 0738  BP: (!) 132/48  121/62 (!) 144/60  Pulse: 87  81 88  Resp: 20  16 18   Temp: (!) 97.4 F (36.3 C)  (!) 97.5 F (36.4 C) 97.9 F (36.6 C)  TempSrc: Oral   Oral  SpO2: 100%  100% 98%  Weight:  62.1 kg    Height:  5' 11 (1.803 m)      Intake/Output Summary (Last 24 hours) at 11/06/2023 0858 Last data filed at 11/06/2023 0002 Gross per 24 hour  Intake 820 ml  Output 800 ml  Net 20 ml       11/06/2023    4:48 AM 10/12/2023   11:06 AM 10/10/2023    9:00 AM  Last 3 Weights  Weight (lbs) 136 lb 14.4 oz 153 lb 3.2 oz 153 lb  Weight (kg) 62.097 kg 69.491 kg 69.4 kg    Body mass index is 19.09 kg/m.  General: Well nourished, well developed, in no acute distress Head: Atraumatic, normal size  Eyes: PEERLA, EOMI  Neck: Supple, no JVD Endocrine: No thryomegaly Cardiac: Normal S1, S2; RRR; faint diastolic murmur Lungs: Clear to auscultation bilaterally, no wheezing, rhonchi or rales  Abd: Soft, nontender, no hepatomegaly  Ext: No edema, pulses 2+ Musculoskeletal: No deformities, BUE and BLE strength normal and equal Skin: Warm and dry, no rashes   Neuro: Alert and oriented to person, place, time, and situation, CNII-XII grossly intact, no focal deficits  Psych: Normal mood and affect   Cardiac Studies  TTE 11/03/2023  1. Technically difficult study, images off axis   2. Left ventricular ejection fraction, by estimation, is 60 to 65%. Left   ventricular ejection fraction by PLAX is 61 %. The left ventricle has  normal function. The left ventricle has no regional wall motion  abnormalities. Left ventricular diastolic  parameters are consistent with Grade I diastolic dysfunction (impaired  relaxation).   3. Right ventricular systolic function is normal. The right ventricular  size is normal.   4. The mitral valve is normal in structure. No evidence of mitral valve  regurgitation.   5. Calcified aortic root. There appears to be eccentric, posteriorly  directed regurgitation (worst on image 77). The aortic valve was not well  visualized. Aortic valve regurgitation is moderate. Aortic valve  sclerosis/calcification is present, without  any evidence of aortic stenosis.   Patient Profile  [name] is a 78 y.o. male with CKD 3B, severe aortic regurgitation (prior endocarditis), diastolic heart failure, COPD, CVA, diabetes, hypertension admitted on 11/01/2023 with acute on chronic diastolic heart failure and AKI.  Assessment & Plan   # Acute on chronic diastolic heart failure # Severe aortic regurgitation - Admitted with worsening shortness of breath.  Known history of severe aortic regurgitation.  Interestingly his LV is not dilated. - Cardiac MRI suggestive of severe AI.  TEE suggestive of moderate-severe AI.  Evaluated by surgery and cardiology this admission.  They feel his valve  needs to be fixed. - No further IV diuresis needed. - We will give him a 500 cc bolus to see if this can help his kidney function.  Creatinine is coming down.  Can proceed to left heart cath today. - CT surgery with plan for aortic valve replacement this admission.  Informed Consent   Shared Decision Making/Informed Consent The risks [stroke (1 in 1000), death (1 in 1000), kidney failure [usually temporary] (1 in 500), bleeding (1 in 200), allergic reaction [possibly serious] (1 in 200)], benefits (diagnostic support and management of coronary  artery disease) and alternatives of a cardiac catheterization were discussed in detail with Mr. Minix and he is willing to proceed.    # AKI # CKD 3b - Creatinine coming down.  Okay for left heart today.  We will give a 500 cc bolus.  He does not appear volume overloaded.  # Elevated troponin, demand - Do not suspect ACS.  Likely related to heart failure.  # CVA - on ASA 81 mg daily, Crestor  40 mg daily  # HTN - Home Norvasc  5 mg daily hydralazine  50 mg 3 times daily     For questions or updates, please contact [facility] Please consult [email or web address] for contact info under        Signed, Darryle T. Barbaraann, MD, [facility]  [facility]  11/06/2023 8:58 AM

## 2023-11-06 NOTE — Progress Notes (Signed)
 Cardiology brief progress note  Asked to see Michael Bender due to R arm pain and recent cardiac cath. No hematoma associated with radial artery site or in forearm. Pain localized around antecubital fossa. Small amount of swelling that is at the antecubital fossa which may represent a small hematoma associated with the brachial sheath (although likely not expanding given small size and number of hours since procedure). Differential for pain includes small hematoma vs nerve injury at site of brachial sheath. Recommend: - wrap area of swelling at proximal forearm - pain control - closely monitor forearm overnight and ensure swelling is not expanding

## 2023-11-06 NOTE — Interval H&P Note (Signed)
 History and Physical Interval Note:  11/06/2023 11:33 AM  Michael Bender  has presented today for surgery, with the diagnosis of Severe Aortic Regurgitation.  The various methods of treatment have been discussed with the patient and family. After consideration of risks, benefits and other options for treatment, the patient has consented to  Procedure(s): RIGHT/LEFT HEART CATH AND CORONARY ANGIOGRAPHY (N/A) as a surgical intervention.  The patient's history has been reviewed, patient examined, no change in status, stable for surgery.  I have reviewed the patient's chart and labs.  Questions were answered to the patient's satisfaction.     [name]

## 2023-11-06 NOTE — Plan of Care (Signed)
 Pt has been clear liquid with minimal intake until 5AM and NPO except for sips with meds since 5AM this morning. Spoke with [facility] Cath lab RN regarding what needs to be done on the unit prior to coming down for cardiac cath per jennifer, only Asprin needs to be given. She said that clipping and everything else will be done in pre-op. Consent signed and in the chart.

## 2023-11-06 NOTE — Progress Notes (Addendum)
 Pt alerted staff of acute onset severe pain of right arm- particularly at right antecubital site, but pain also from right antecubital site down to right hand. Pt reports he has had pain in this area since completion of cardiac cath earlier today but it has not been this severe until now. Vitals obtained, all WNL, HR slightly tachycardic @ 106bpm. Pt denies chest pain, shortness of breath, dizziness, or lightheadedness. Neurovascular assessment of right arm WNL. Right AC and below to hand is extremely tender to very minimal touch. Dressing at site of right [facility] (previously site of brachial sheath during cardiac cath procedure earlier today) removed. Pt reports slight improvement in pain since removal but not much. No discoloration or asymmetrical swelling noted anywhere along right arm. Slight swelling at right [facility]. Right radial point of entry for cardiac cath procedure w/ tegaderm and gauze dressing C/D/I and no swelling or discoloration noted. Pt denies numbness or tingling to any area of right arm, only severe pain.   Dr. Franky notified. Dr. Dorn Flemings w/ cardiology alerted by Dr. Franky. Dr. Flemings to bedside to assess patient. Dr. Flemings notified that pt's do not typically come back to a med surg unit s/p cardiac cath procedures. In addition, since his return to floor from procedure earlier today, he has not been able to be monitored via central telemetry due to lack of telemetry monitoring devices. Day shift RN notified physician prior to night shift and voiced safety concerns regarding this pt not being monitored on telemetry, as well as not being on a cardiac care unit, considering his significant cardiac history, including but not limited to severe aortic valve regurgitation. These concerns were voice by myself and charge RN to Dr. Flemings. Dr. Franky ordered telemetry monitoring for pt and I informed him that we do not have any monitor devices on our unit and have not been able to obtain one  from any other units. Dr. Franky stated as long as pt remains hemodynamically stable, ok to be off tele monitoring. Also ordered one time dose of oxycodone  5mg  for pain. Per both Dr. Franky & Dr. Flemings, requested RN to monitor right antecubital site for swelling and discoloration Q1 hr for 3hrs and notify of any changes. Dr. Flemings requested dressing be placed on right antecubital site. Did not specify pressure dressing, stretch gauze wrapped around right antecubital site. Pt frustrated w/ reapplication of dressing at site and continuing to report severe pain. Discussed monitoring and assessment instructions from physicians, pt verbalized agreement and understanding. Asked that pt immediately alert staff of any additional symptoms including changes in sensation to right arm. Pt verbalizes agreement. Asked pt to keep right arm elevated as much as possible, pillow provided for elevation while lying down.   01:15 assessment of RAC: noted asymmetrical swelling to lateral aspect of site with slight discoloration. Area marked with marker to assess for changes in shape and size. Pt still extremely tender to minimal touch at that site. Reports continued 9/10 pain at [facility] and below with little to no relief from 5mg  oxycodone . Dr. Franky & Dr. Flemings notified of assessment findings and continued unrelieved severe pain.

## 2023-11-07 ENCOUNTER — Inpatient Hospital Stay (HOSPITAL_COMMUNITY)

## 2023-11-07 ENCOUNTER — Encounter (HOSPITAL_COMMUNITY): Payer: Self-pay | Admitting: Cardiovascular Disease

## 2023-11-07 DIAGNOSIS — I351 Nonrheumatic aortic (valve) insufficiency: Secondary | ICD-10-CM | POA: Diagnosis not present

## 2023-11-07 DIAGNOSIS — I5031 Acute diastolic (congestive) heart failure: Secondary | ICD-10-CM | POA: Diagnosis not present

## 2023-11-07 DIAGNOSIS — M7989 Other specified soft tissue disorders: Secondary | ICD-10-CM

## 2023-11-07 DIAGNOSIS — M79603 Pain in arm, unspecified: Secondary | ICD-10-CM | POA: Diagnosis not present

## 2023-11-07 DIAGNOSIS — G8918 Other acute postprocedural pain: Secondary | ICD-10-CM | POA: Diagnosis not present

## 2023-11-07 DIAGNOSIS — N179 Acute kidney failure, unspecified: Secondary | ICD-10-CM | POA: Diagnosis not present

## 2023-11-07 DIAGNOSIS — R0602 Shortness of breath: Secondary | ICD-10-CM | POA: Diagnosis not present

## 2023-11-07 DIAGNOSIS — N189 Chronic kidney disease, unspecified: Secondary | ICD-10-CM | POA: Diagnosis not present

## 2023-11-07 LAB — CBC
HCT: 32.6 % — ABNORMAL LOW (ref 39.0–52.0)
Hemoglobin: 10.8 g/dL — ABNORMAL LOW (ref 13.0–17.0)
MCH: 30.9 pg (ref 26.0–34.0)
MCHC: 33.1 g/dL (ref 30.0–36.0)
MCV: 93.1 fL (ref 80.0–100.0)
Platelets: 130 K/uL — ABNORMAL LOW (ref 150–400)
RBC: 3.5 MIL/uL — ABNORMAL LOW (ref 4.22–5.81)
RDW: 14 % (ref 11.5–15.5)
WBC: 6 K/uL (ref 4.0–10.5)
nRBC: 0 % (ref 0.0–0.2)

## 2023-11-07 LAB — GLUCOSE, CAPILLARY
Glucose-Capillary: 121 mg/dL — ABNORMAL HIGH (ref 70–99)
Glucose-Capillary: 230 mg/dL — ABNORMAL HIGH (ref 70–99)
Glucose-Capillary: 157 mg/dL — ABNORMAL HIGH (ref 70–99)
Glucose-Capillary: 132 mg/dL — ABNORMAL HIGH (ref 70–99)

## 2023-11-07 LAB — BASIC METABOLIC PANEL WITH GFR
Anion gap: 12 (ref 5–15)
BUN: 34 mg/dL — ABNORMAL HIGH (ref 8–23)
CO2: 18 mmol/L — ABNORMAL LOW (ref 22–32)
Calcium: 8.5 mg/dL — ABNORMAL LOW (ref 8.9–10.3)
Chloride: 109 mmol/L (ref 98–111)
Creatinine, Ser: 2.55 mg/dL — ABNORMAL HIGH (ref 0.61–1.24)
GFR, Estimated: 25 mL/min — ABNORMAL LOW (ref >60)
Glucose, Bld: 116 mg/dL — ABNORMAL HIGH (ref 70–99)
Potassium: 4.5 mmol/L (ref 3.5–5.1)
Sodium: 139 mmol/L (ref 135–145)

## 2023-11-07 LAB — PREPARE RBC (CROSSMATCH)

## 2023-11-07 LAB — MAGNESIUM: Magnesium: 2.4 mg/dL (ref 1.7–2.4)

## 2023-11-07 MED ORDER — MILRINONE LACTATE IN DEXTROSE 20-5 MG/100ML-% IV SOLN
0.3000 ug/kg/min | INTRAVENOUS | Status: DC
  Filled 2023-11-07: qty 100

## 2023-11-07 MED ORDER — NOREPINEPHRINE 4 MG/250ML-% IV SOLN
0.0000 ug/min | INTRAVENOUS | Status: AC
  Administered 2023-11-08: 2 ug/min via INTRAVENOUS
  Filled 2023-11-07: qty 250

## 2023-11-07 MED ORDER — DEXMEDETOMIDINE HCL IN NACL 400 MCG/100ML IV SOLN
0.1000 ug/kg/h | INTRAVENOUS | Status: AC
Start: 2023-11-08 — End: 2023-11-09
  Administered 2023-11-08: .4 ug/kg/h via INTRAVENOUS
  Filled 2023-11-07: qty 100

## 2023-11-07 MED ORDER — METOPROLOL TARTRATE 12.5 MG HALF TABLET
12.5000 mg | ORAL_TABLET | Freq: Once | ORAL | Status: AC
  Administered 2023-11-08: 12.5 mg via ORAL
  Filled 2023-11-07: qty 1

## 2023-11-07 MED ORDER — HEPARIN 30,000 UNITS/1000 ML (OHS) CELLSAVER SOLUTION
Status: DC
  Filled 2023-11-07: qty 1000

## 2023-11-07 MED ORDER — NITROGLYCERIN IN D5W 200-5 MCG/ML-% IV SOLN
2.0000 ug/min | INTRAVENOUS | Status: DC
  Filled 2023-11-07: qty 250

## 2023-11-07 MED ORDER — MANNITOL 20 % IV SOLN
INTRAVENOUS | Status: DC
  Filled 2023-11-07: qty 13

## 2023-11-07 MED ORDER — CHLORHEXIDINE GLUCONATE CLOTH 2 % EX PADS
6.0000 | MEDICATED_PAD | Freq: Once | CUTANEOUS | Status: AC
  Administered 2023-11-07: 6 via TOPICAL

## 2023-11-07 MED ORDER — PHENYLEPHRINE HCL-NACL 20-0.9 MG/250ML-% IV SOLN
30.0000 ug/min | INTRAVENOUS | Status: AC
  Administered 2023-11-08: 20 ug/min via INTRAVENOUS
  Filled 2023-11-07: qty 250

## 2023-11-07 MED ORDER — POTASSIUM CHLORIDE 2 MEQ/ML IV SOLN
80.0000 meq | INTRAVENOUS | Status: DC
  Filled 2023-11-07: qty 40

## 2023-11-07 MED ORDER — BISACODYL 5 MG PO TBEC
5.0000 mg | DELAYED_RELEASE_TABLET | Freq: Once | ORAL | Status: AC
  Administered 2023-11-07: 5 mg via ORAL
  Filled 2023-11-07: qty 1

## 2023-11-07 MED ORDER — CEFAZOLIN SODIUM-DEXTROSE 2-4 GM/100ML-% IV SOLN
2.0000 g | INTRAVENOUS | Status: AC
  Administered 2023-11-08 (×2): 2 g via INTRAVENOUS
  Filled 2023-11-07: qty 100

## 2023-11-07 MED ORDER — SODIUM CHLORIDE 0.9% FLUSH: 3.0000 mL | INTRAVENOUS | Status: DC | PRN

## 2023-11-07 MED ORDER — SODIUM CHLORIDE 0.9% FLUSH
3.0000 mL | Freq: Two times a day (BID) | INTRAVENOUS | Status: DC
  Administered 2023-11-07: 3 mL via INTRAVENOUS

## 2023-11-07 MED ORDER — OXYCODONE HCL 5 MG PO TABS
5.0000 mg | ORAL_TABLET | ORAL | Status: DC | PRN
  Administered 2023-11-07: 5 mg via ORAL
  Filled 2023-11-07: qty 1

## 2023-11-07 MED ORDER — EPINEPHRINE HCL 5 MG/250ML IV SOLN IN NS
0.0000 ug/min | INTRAVENOUS | Status: AC
  Administered 2023-11-08: 2 ug/min via INTRAVENOUS
  Filled 2023-11-07: qty 250

## 2023-11-07 MED ORDER — SODIUM CHLORIDE 0.9 % IV SOLN: 250.0000 mL | INTRAVENOUS | Status: DC | PRN

## 2023-11-07 MED ORDER — CEFAZOLIN SODIUM-DEXTROSE 2-4 GM/100ML-% IV SOLN
2.0000 g | INTRAVENOUS | Status: DC
  Filled 2023-11-07: qty 100

## 2023-11-07 MED ORDER — HYDROMORPHONE HCL 1 MG/ML IJ SOLN
0.5000 mg | INTRAMUSCULAR | Status: AC | PRN
  Administered 2023-11-07 (×2): 0.5 mg via INTRAVENOUS
  Filled 2023-11-07 (×2): qty 1

## 2023-11-07 MED ORDER — INSULIN REGULAR(HUMAN) IN NACL 100-0.9 UT/100ML-% IV SOLN
INTRAVENOUS | Status: AC
  Administered 2023-11-08: .5 [IU]/h via INTRAVENOUS
  Filled 2023-11-07: qty 100

## 2023-11-07 MED ORDER — TRANEXAMIC ACID (OHS) PUMP PRIME SOLUTION
2.0000 mg/kg | INTRAVENOUS | Status: DC
  Filled 2023-11-07: qty 1.24

## 2023-11-07 MED ORDER — VANCOMYCIN HCL 1250 MG/250ML IV SOLN
1250.0000 mg | INTRAVENOUS | Status: AC
  Administered 2023-11-08: 1250 mg via INTRAVENOUS
  Filled 2023-11-07: qty 250

## 2023-11-07 MED ORDER — TRANEXAMIC ACID 1000 MG/10ML IV SOLN
1.5000 mg/kg/h | INTRAVENOUS | Status: AC
  Administered 2023-11-08: 1.5 mg/kg/h via INTRAVENOUS
  Filled 2023-11-07: qty 25

## 2023-11-07 MED ORDER — FREE WATER
500.0000 mL | Freq: Once | Status: AC
  Administered 2023-11-07: 500 mL via ORAL

## 2023-11-07 MED ORDER — PLASMA-LYTE A IV SOLN
INTRAVENOUS | Status: DC
  Filled 2023-11-07: qty 2.5

## 2023-11-07 MED ORDER — CHLORHEXIDINE GLUCONATE 0.12 % MT SOLN
15.0000 mL | Freq: Once | OROMUCOSAL | Status: AC
  Administered 2023-11-08: 15 mL via OROMUCOSAL

## 2023-11-07 MED ORDER — TRANEXAMIC ACID (OHS) BOLUS VIA INFUSION
15.0000 mg/kg | INTRAVENOUS | Status: AC
  Administered 2023-11-08: 931.5 mg via INTRAVENOUS
  Filled 2023-11-07: qty 932

## 2023-11-07 NOTE — Progress Notes (Signed)
 Cardiology Progress Note  Patient ID: Michael Bender MRN: [id number] DOB: [birth year] Date of Encounter: 11/07/2023 Primary Cardiologist: Vina Gull, MD  Subjective   Chief Complaint:  L arm pain  HPI: Describes right arm pain after right radial catheterization yesterday.  2+ pulse.  Seems tender in the antecubital fossa.  No chest pain or trouble breathing.  ROS:  All other ROS reviewed and negative. Pertinent positives noted in the HPI.     Telemetry  Overnight telemetry shows sinus rhythm 70s, which I personally reviewed.    Physical Exam   Vitals:   11/06/23 2109 11/06/23 2250 11/07/23 0410 11/07/23 0808  BP: (!) 135/56 (!) 129/53 (!) 152/57 (!) 137/54  Pulse: 88 (!) 106 93 90  Resp: 18 18 18    Temp: 98.4 F (36.9 C) 98.2 F (36.8 C) 98.2 F (36.8 C) 98.6 F (37 C)  TempSrc: Oral Oral Oral Oral  SpO2: 98% 99% 98% 99%  Weight:      Height:        Intake/Output Summary (Last 24 hours) at 11/07/2023 0917 Last data filed at 11/07/2023 0653 Gross per 24 hour  Intake 1000 ml  Output 375 ml  Net 625 ml       11/06/2023    4:48 AM 10/12/2023   11:06 AM 10/10/2023    9:00 AM  Last 3 Weights  Weight (lbs) 136 lb 14.4 oz 153 lb 3.2 oz 153 lb  Weight (kg) 62.097 kg 69.491 kg 69.4 kg    Body mass index is 19.09 kg/m.  General: Well nourished, well developed, in no acute distress Head: Atraumatic, normal size  Eyes: PEERLA, EOMI  Neck: Supple, no JVD Endocrine: No thryomegaly Cardiac: Normal S1, S2; RRR; diastolic murmur noted Lungs: Clear to auscultation bilaterally, no wheezing, rhonchi or rales  Abd: Soft, nontender, no hepatomegaly  Ext: No edema, pulses 2+ Musculoskeletal: No deformities, BUE and BLE strength normal and equal Skin: Warm and dry, no rashes   Neuro: Alert and oriented to person, place, time, and situation, CNII-XII grossly intact, no focal deficits  Psych: Normal mood and affect   Cardiac Studies  LHC/RHC 11/06/2023 1.  Patent coronary  arteries with mild diffuse calcific plaquing but no significant stenoses 2.  Known severe aortic insufficiency with wide pulse pressure noted 3.  Right heart data: RA mean 3 mmHg RV 30/5 mmHg PA 30/14 mean 20 mmHg Pulmonary wedge pressure 13 mmHg (A-wave 12, V wave 15) LVEDP 9 mmHg Cardiac output/index by Fick measurement 6.1/3.4 TTE 11/03/2023  1. Technically difficult study, images off axis   2. Left ventricular ejection fraction, by estimation, is 60 to 65%. Left  ventricular ejection fraction by PLAX is 61 %. The left ventricle has  normal function. The left ventricle has no regional wall motion  abnormalities. Left ventricular diastolic  parameters are consistent with Grade I diastolic dysfunction (impaired  relaxation).   3. Right ventricular systolic function is normal. The right ventricular  size is normal.   4. The mitral valve is normal in structure. No evidence of mitral valve  regurgitation.   5. Calcified aortic root. There appears to be eccentric, posteriorly  directed regurgitation (worst on image 77). The aortic valve was not well  visualized. Aortic valve regurgitation is moderate. Aortic valve  sclerosis/calcification is present, without  any evidence of aortic stenosis.   Patient Profile  Michael Bender is a 78 y.o. male with CKD stage IIIb, HFpEF, severe aortic regurgitation (secondary to endocarditis), CVA, COPD, diabetes,  hypertension admitted on 11/01/2023 for acute on chronic diastolic heart failure and AKI.  Assessment & Plan   # Acute on chronic diastolic heart failure # Severe aortic regurgitation - He has been effectively diuresed.  Known history of severe AI. - No further IV diuresis.  Plan is for aortic valve replacement tomorrow. - N.p.o. at midnight.  # R arm pain - Status post right radial cardiac catheterization yesterday.  2+ pulse.  Has some pain in the right antecubital fossa.  I do not believe this is vascular neurological in nature.  He  does have some swelling.  May have had some bleeding.  Suspect this will resolve with conservative approach.  Would recommend warm compresses and elevation.  Follow-up vascular ultrasound.  # AKI # CKD 3B - Creatinine is improving.  Baseline 1.4-1.7. -Suspect related to heart failure.  # Elevated troponin, demand -No significant CAD.  Demand.  # CVA -On aspirin and statin.  # HTN - Amlodipine  and hydralazine .  Titrate up for better rate control.    For questions or updates, please contact [facility] Please consult [email or web address] for contact info under         Signed, Darryle T. Barbaraann, MD, [facility]  [facility]  11/07/2023 9:17 AM

## 2023-11-07 NOTE — Significant Event (Signed)
 Patient's nurse notified me that patient was having increasing pain around the right antecubital area.  Pain started after his cardiac cath.  I discussed with cardiologist on-call Dr. Marty.  On exam at bedside the right antecubital area is tender with mild swelling.  Pulses are palpable.  No restriction of right upper extremity movement or sensation.  I discussed with on-call vascular surgeon Dr. Silver who advise getting right upper extremity Doppler.  Michael Bender

## 2023-11-07 NOTE — Progress Notes (Signed)
 Progress Note   Patient: Michael Bender DOB: Jan 31, 1946 DOA: 11/01/2023  DOS: the patient was seen and examined on 11/07/2023   Brief hospital course:  78 y.o. 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   Assessment and Plan:   Severe aortic insufficiency/Hx MSSA endocarditis/dyspnea on exertion - Previous evaluation by CT surgery 7/30 with recommendations for surgical correction.  Patient is not a candidate for TAVR.  Evaluated by CT surgery.  Plans to pursue valve replacement.  Cardiac cath performed yesterday 10/6.  N.p.o. after midnight, anticipate valve surgery in AM.  Acute RUE pain - Initially suspected to be secondary to cath.  No hematoma, pain improving.  Evaluated by vascular surgery and cardiology.  Awaiting right upper extremity ultrasound.   Acute kidney injury on CKD 3b - Creatinine slightly improved from yesterday, however now with resolved acidemia.  Responded well to IV fluids the last few days.  Will continue to monitor urine output and repeat bmp in a.m.   Chronic HFrEF -Does not appear to be volume overloaded.  No edema appreciated.  No hypoxia.  Cardiology consulted and following closely.   Elevated troponin - Likely demand ischemia.  Troponins minimally elevated and flat.   Atrial fibrillation - Avoiding beta-blocker.  Not on AC despite CHA2DS2-VASc = 6.   CAD/PAD/HLD - Aspirin and statin on board.   Hypertension - Amlodipine  on board.  Holding beta-blockers.   Diabetes mellitus -insulin  sliding scale on board.   Physical debilitation muscle weakness - Patient is very frail, limited by aortic insufficiency as above.  Will hold off on PT eval secondary to CT surgery.   Subjective: Patient resting comfortably this morning.  Rough night  overnight, increased pain after coming back from cath.  Denies any fever, chills, chest pain, nausea vomiting, abdominal pain.  Physical Exam:  Vitals:   11/06/23 2250 11/07/23 0410 11/07/23 0808 11/07/23 1222  BP: (!) 129/53 (!) 152/57 (!) 137/54 (!) 158/50  Pulse: (!) 106 93 90 (!) 102  Resp: 18 18    Temp: 98.2 F (36.8 C) 98.2 F (36.8 C) 98.6 F (37 C) 98.2 F (36.8 C)  TempSrc: Oral Oral Oral   SpO2: 99% 98% 99% 100%  Weight:      Height:        GENERAL:  Alert, pleasant, no acute distress  HEENT:  EOMI CARDIOVASCULAR:  RRR, murmur appreciated RESPIRATORY:  Clear to auscultation, no wheezing, rales, or rhonchi GASTROINTESTINAL:  Soft, nontender, nondistended EXTREMITIES:  thin, No LE edema bilaterally NEURO:  No new focal deficits appreciated SKIN:  No rashes noted PSYCH:  Appropriate mood and affect    Data Reviewed:  Imaging Studies: CARDIAC CATHETERIZATION Result Date: 11/06/2023 1.  Patent coronary arteries with mild diffuse calcific plaquing but no significant stenoses 2.  Known severe aortic insufficiency with wide pulse pressure noted 3.  Right heart data: RA mean 3 mmHg RV 30/5 mmHg PA 30/14 mean 20 mmHg Pulmonary wedge pressure 13 mmHg (A-wave 12, V wave 15) LVEDP 9 mmHg Cardiac output/index by Fick measurement 6.1/3.4   VAS US  DOPPLER PRE CABG Result Date: 11/06/2023 PREOPERATIVE VASCULAR EVALUATION Patient Name:  Michael Bender  Date of Exam:   11/04/2023 Medical Rec #: [id number]         Accession #:    [id number] Date of Birth: 16-Nov-1945  Patient Gender: M Patient Age:   2 years Exam Location:  [facility] Procedure:      VAS US  DOPPLER PRE CABG Referring Phys: BAILEY CHAMBERS --------------------------------------------------------------------------------  Indications:      Pre-CABG. Risk Factors:     Hypertension, hyperlipidemia. Comparison Study: No prior studies. Performing Technologist: Gerome Ny RVT  Examination Guidelines: A complete  evaluation includes B-mode imaging, spectral Doppler, color Doppler, and power Doppler as needed of all accessible portions of each vessel. Bilateral testing is considered an integral part of a complete examination. Limited examinations for reoccurring indications may be performed as noted.  Right Carotid Findings: +----------+--------+-------+--------+--------------------------------+--------+           PSV cm/sEDV    StenosisDescribe                        Comments                   cm/s                                                    +----------+--------+-------+--------+--------------------------------+--------+ CCA Prox  151     18             smooth, heterogenous and                                                  calcific                                 +----------+--------+-------+--------+--------------------------------+--------+ CCA Distal90      12             smooth and heterogenous                  +----------+--------+-------+--------+--------------------------------+--------+ ICA Prox  85      17             irregular and heterogenous               +----------+--------+-------+--------+--------------------------------+--------+ ICA Mid   80      16                                             tortuous +----------+--------+-------+--------+--------------------------------+--------+ ICA Distal77      13                                             tortuous +----------+--------+-------+--------+--------------------------------+--------+ ECA       133     3                                                       +----------+--------+-------+--------+--------------------------------+--------+ +----------+--------+-------+--------+------------+           PSV cm/sEDV  cmsDescribeArm Pressure +----------+--------+-------+--------+------------+ Subclavian84                                   +----------+--------+-------+--------+------------+ +---------+--------+--+--------+--+---------+ VertebralPSV cm/s49EDV cm/s12Antegrade +---------+--------+--+--------+--+---------+ Left Carotid Findings: +----------+--------+--------+--------+-----------------------+--------+           PSV cm/sEDV cm/sStenosisDescribe               Comments +----------+--------+--------+--------+-----------------------+--------+ CCA Prox  95      11                                              +----------+--------+--------+--------+-----------------------+--------+ CCA Distal110     9               smooth and heterogenous         +----------+--------+--------+--------+-----------------------+--------+ ICA Prox  128     20              smooth and heterogenous         +----------+--------+--------+--------+-----------------------+--------+ ICA Mid   132     21                                              +----------+--------+--------+--------+-----------------------+--------+ ICA Distal144     17                                     tortuous +----------+--------+--------+--------+-----------------------+--------+ ECA       103     1                                               +----------+--------+--------+--------+-----------------------+--------+ +----------+--------+--------+--------+------------+ SubclavianPSV cm/sEDV cm/sDescribeArm Pressure +----------+--------+--------+--------+------------+           92                                   +----------+--------+--------+--------+------------+ +---------+--------+--+--------+-+---------+ VertebralPSV cm/s55EDV cm/s7Antegrade +---------+--------+--+--------+-+---------+  ABI Findings: +------------------+-----+-----------+ Rt Pressure (mmHg)IndexWaveform    +------------------+-----+-----------+ 124                    triphasic   +------------------+-----+-----------+ 187               1.51  multiphasic +------------------+-----+-----------+ 161               1.30 multiphasic +------------------+-----+-----------+ +------------------+-----+----------+ Lt Pressure (mmHg)IndexWaveform   +------------------+-----+----------+ 118                    triphasic  +------------------+-----+----------+ 254               2.05 monophasic +------------------+-----+----------+ 58                0.47 monophasic +------------------+-----+----------+ +-------+---------------+ ABI/TBIToday's ABI/TBI +-------+---------------+ Right  1.51            +-------+---------------+ Left   0.47            +-------+---------------+  Right Doppler Findings: +--------+--------+---------+ Site  PressureDoppler   +--------+--------+---------+ [id number]     triphasic +--------+--------+---------+ Radial          triphasic +--------+--------+---------+ Ulnar           triphasic +--------+--------+---------+  Left Doppler Findings: +--------+--------+---------+ Site    PressureDoppler   +--------+--------+---------+ [id number]     triphasic +--------+--------+---------+ Radial          triphasic +--------+--------+---------+ Ulnar           triphasic +--------+--------+---------+   Summary: Right Carotid: Velocities in the right ICA are consistent with a 1-39% stenosis. Left Carotid: Velocities in the left ICA are consistent with a 1-39% stenosis. Vertebrals: Bilateral vertebral arteries demonstrate antegrade flow. Right ABI: Resting right ankle-brachial index indicates noncompressible right lower extremity arteries. Left ABI: Resting left ankle-brachial index indicates severe left lower extremity arterial disease. Right Upper Extremity: Doppler waveform obliterate with right radial compression. Doppler waveform obliterate with right ulnar compression. Left Upper Extremity: Doppler waveform obliterate with left radial compression. Doppler waveforms decrease >50% with left  ulnar compression.  Electronically signed by Gaile New MD on 11/06/2023 at 8:16:53 AM.    Final    ECHOCARDIOGRAM COMPLETE Result Date: 11/03/2023    ECHOCARDIOGRAM REPORT   Patient Name:   JAYJAY LITTLES Date of Exam: 11/02/2023 Medical Rec #:  [id number]        Height:       71.0 in Accession #:    [id number]       Weight:       153.2 lb Date of Birth:  10/09/1945       BSA:          1.883 m Patient Age:    68 years         BP:           112/64 mmHg Patient Gender: M                HR:           74 bpm. Exam Location:  Inpatient Procedure: 2D Echo, Cardiac Doppler and Color Doppler (Both Spectral and Color            Flow Doppler were utilized during procedure).                                 MODIFIED REPORT:  This report was modified by Vinie Maxcy MD on 11/03/2023 due to Re-evaluated           aortic insufficiency in light of more recent TEE comparison.  Indications:     Aortic regurgitation  History:         Patient has prior history of Echocardiogram examinations, most                  recent 04/21/2023. CHF, COPD, Signs/Symptoms:Shortness of                  Breath; Risk Factors:Hypertension and Former Smoker.  Sonographer:     Juliene Rucks Referring Phys:  [id number] ZANE ADAMS Diagnosing Phys: Vinie Maxcy MD  Sonographer Comments: Technically difficult study due to poor echo windows. Image acquisition challenging due to COPD and Image acquisition challenging due to respiratory motion. IMPRESSIONS  1. Technically difficult study, images off axis  2. Left ventricular ejection fraction, by estimation, is 60 to 65%. Left ventricular ejection fraction by PLAX is 61 %. The left ventricle has normal function. The left ventricle has  no regional wall motion abnormalities. Left ventricular diastolic parameters are consistent with Grade I diastolic dysfunction (impaired relaxation).  3. Right ventricular systolic function is normal. The right ventricular size is normal.  4. The mitral valve is normal in  structure. No evidence of mitral valve regurgitation.  5. Calcified aortic root. There appears to be eccentric, posteriorly directed regurgitation (worst on image 77). The aortic valve was not well visualized. Aortic valve regurgitation is moderate. Aortic valve sclerosis/calcification is present, without any evidence of aortic stenosis. Comparison(s): Changes from prior study are noted. 07/24/2023: LVEF 50-55%, severe AI with oscillating density on the left coronary cusp. FINDINGS  Left Ventricle: Left ventricular ejection fraction, by estimation, is 60 to 65%. Left ventricular ejection fraction by PLAX is 61 %. The left ventricle has normal function. The left ventricle has no regional wall motion abnormalities. The left ventricular internal cavity size was normal in size. There is no left ventricular hypertrophy. Left ventricular diastolic parameters are consistent with Grade I diastolic dysfunction (impaired relaxation). Indeterminate filling pressures. Right Ventricle: The right ventricular size is normal. No increase in right ventricular wall thickness. Right ventricular systolic function is normal. Left Atrium: Left atrial size was normal in size. Right Atrium: Right atrial size was normal in size. Pericardium: There is no evidence of pericardial effusion. Mitral Valve: The mitral valve is normal in structure. No evidence of mitral valve regurgitation. Tricuspid Valve: The tricuspid valve is not well visualized. Tricuspid valve regurgitation is not demonstrated. Aortic Valve: Calcified aortic root. There appears to be eccentric, posteriorly directed regurgitation (worst on image 77). The aortic valve was not well visualized. There is moderate aortic valve annular calcification. Aortic valve regurgitation is moderate. Aortic valve sclerosis/calcification is present, without any evidence of aortic stenosis. Pulmonic Valve: The pulmonic valve was normal in structure. Pulmonic valve regurgitation is not visualized.  Aorta: The aortic root and ascending aorta are structurally normal, with no evidence of dilitation. Venous: The inferior vena cava was not well visualized. IAS/Shunts: The interatrial septum was not well visualized.  LEFT VENTRICLE PLAX 2D LV EF:         Left            Diastology                ventricular     LV e' medial:    4.97 cm/s                ejection        LV E/e' medial:  7.8                fraction by     LV e' lateral:   7.77 cm/s                PLAX is 61      LV E/e' lateral: 5.0                %. LVIDd:         4.00 cm LVIDs:         2.70 cm LV PW:         1.10 cm LV IVS:        1.00 cm LVOT diam:     2.10 cm LVOT Area:     3.46 cm  RIGHT VENTRICLE RV Basal diam:  3.30 cm RV Mid diam:    3.00 cm RV S prime:     17.60 cm/s TAPSE (M-mode): 1.5 cm LEFT ATRIUM  Index        RIGHT ATRIUM           Index LA diam:      2.70 cm 1.43 cm/m   RA Area:     13.80 cm LA Vol (A4C): 37.3 ml 19.81 ml/m  RA Volume:   33.60 ml  17.85 ml/m  MITRAL VALVE MV Area (PHT): 2.83 cm    SHUNTS MV Decel Time: 268 msec    Systemic Diam: 2.10 cm MV E velocity: 38.80 cm/s MV A velocity: 63.40 cm/s MV E/A ratio:  0.61 Vinie Maxcy MD Electronically signed by Vinie Maxcy MD Signature Date/Time: 11/02/2023/2:12:15 PM    Final (Updated)    CT CHEST WO CONTRAST Result Date: 11/02/2023 CLINICAL DATA:  CABG workup short of breath EXAM: CT CHEST WITHOUT CONTRAST TECHNIQUE: Multidetector CT imaging of the chest was performed following the standard protocol without IV contrast. RADIATION DOSE REDUCTION: This exam was performed according to the departmental dose-optimization program which includes automated exposure control, adjustment of the mA and/or kV according to patient size and/or use of iterative reconstruction technique. COMPARISON:  Chest x-ray 11/01/2023, chest CT 07/17/2019 FINDINGS: Cardiovascular: Limited assessment without intravenous contrast. Advanced aortic atherosclerosis. Mild aneurysmal dilatation of  the ascending aorta, measuring 4.1 cm maximum. Multi-vessel advanced coronary vascular calcification. Normal cardiac size. No pericardial effusion Mediastinum/Nodes: Patent trachea. No thyroid  mass. No suspicious lymph nodes. Esophagus within normal limits. Lungs/Pleura: Emphysema. Scarring within the left upper and lower lobes. Band like density and thickening along the right posterior pleural surface with a few scattered calcifications suggestive of chronic scarring. Mild right lower lobe bronchiectasis. These findings are new when compared to lung bases from CT of September 2024. A few scattered punctate pulmonary nodules, largest seen in the left base and measures 3 mm on series 3, image 119. Upper Abdomen: No acute finding. Multiple renal cysts incompletely visualize, no specific imaging follow-up is recommended. Numerous pancreatic calcifications with pancreatic atrophy consistent with chronic pancreatitis. Musculoskeletal: No acute osseous abnormality. IMPRESSION: 1. Emphysema. Scarring within the left upper and lower lobes. Band like density and thickening along the right posterior pleural surface with a few scattered calcifications suggestive of chronic scarring but new compared with 2024. Mild right lower lobe bronchiectasis. 2. Few small punctate pulmonary nodules measuring up to 3 mm No follow-up needed if patient is low-risk (and has no known or suspected primary neoplasm). Non-contrast chest CT can be considered in 12 months if patient is high-risk. This recommendation follows the consensus statement: Guidelines for Management of Incidental Pulmonary Nodules Detected on CT Images: From the Fleischner Society 2017; Radiology 2017; 284:228-243. 3. Aortic atherosclerosis. Mild aneurysmal dilatation of ascending aorta up to 4.1 cm. Recommend annual imaging followup by CTA or MRA. This recommendation follows 2010 ACCF/AHA/AATS/ACR/ASA/SCA/SCAI/SIR/STS/SVM Guidelines for the Diagnosis and Management of  Patients with Thoracic Aortic Disease. Circulation. 2010; 121: Z733-z630. Aortic aneurysm NOS (ICD10-I71.9) 4. Findings consistent with chronic pancreatitis Aortic Atherosclerosis (ICD10-I70.0) and Emphysema (ICD10-J43.9). Electronically Signed   By: Luke Bun M.D.   On: 11/02/2023 21:21   VAS US  LOWER EXTREMITY VENOUS (DVT) Result Date: 11/02/2023  Lower Venous DVT Study Patient Name:  JUANJESUS PEPPERMAN  Date of Exam:   11/02/2023 Medical Rec #: [id number]         Accession #:    [id number] Date of Birth: 04-Oct-1945        Patient Gender: M Patient Age:   51 years Exam Location:  [facility] Procedure:  VAS US  LOWER EXTREMITY VENOUS (DVT) Referring Phys: EKTA PATEL --------------------------------------------------------------------------------  Indications: SOB, and Edema (Leg size one is larger than other).  Comparison Study: No previous exams Performing Technologist: Jody Hill RVT, RDMS  Examination Guidelines: A complete evaluation includes B-mode imaging, spectral Doppler, color Doppler, and power Doppler as needed of all accessible portions of each vessel. Bilateral testing is considered an integral part of a complete examination. Limited examinations for reoccurring indications may be performed as noted. The reflux portion of the exam is performed with the patient in reverse Trendelenburg.  +---------+---------------+---------+-----------+----------+--------------+ RIGHT    CompressibilityPhasicitySpontaneityPropertiesThrombus Aging +---------+---------------+---------+-----------+----------+--------------+ CFV      Full           Yes      Yes                                 +---------+---------------+---------+-----------+----------+--------------+ SFJ      Full                                                        +---------+---------------+---------+-----------+----------+--------------+ FV Prox  Full           Yes      Yes                                  +---------+---------------+---------+-----------+----------+--------------+ FV Mid   Full           Yes      Yes                                 +---------+---------------+---------+-----------+----------+--------------+ FV DistalFull           Yes      Yes                                 +---------+---------------+---------+-----------+----------+--------------+ PFV      Full                                                        +---------+---------------+---------+-----------+----------+--------------+ POP      Full           Yes      Yes                                 +---------+---------------+---------+-----------+----------+--------------+ PTV      Full                                                        +---------+---------------+---------+-----------+----------+--------------+ PERO     Full                                                        +---------+---------------+---------+-----------+----------+--------------+   +---------+---------------+---------+-----------+----------+--------------+  LEFT     CompressibilityPhasicitySpontaneityPropertiesThrombus Aging +---------+---------------+---------+-----------+----------+--------------+ CFV      Full           Yes      Yes                                 +---------+---------------+---------+-----------+----------+--------------+ SFJ      Full                                                        +---------+---------------+---------+-----------+----------+--------------+ FV Prox  Full           Yes      Yes                                 +---------+---------------+---------+-----------+----------+--------------+ FV Mid   Full           Yes      Yes                                 +---------+---------------+---------+-----------+----------+--------------+ FV DistalFull           Yes      Yes                                  +---------+---------------+---------+-----------+----------+--------------+ PFV      Full                                                        +---------+---------------+---------+-----------+----------+--------------+ POP      Full           Yes      Yes                                 +---------+---------------+---------+-----------+----------+--------------+ PTV      Full                                                        +---------+---------------+---------+-----------+----------+--------------+ PERO     Full                                                        +---------+---------------+---------+-----------+----------+--------------+     Summary: BILATERAL: - No evidence of deep vein thrombosis seen in the lower extremities, bilaterally. -No evidence of popliteal cyst, bilaterally.   *See table(s) above for measurements and observations. Electronically signed by Debby Robertson on 11/02/2023 at 5:37:58 PM.    Final    DG Chest 2 View Result Date: 11/01/2023 CLINICAL DATA:  Dyspnea on exertion. EXAM:  CHEST - 2 VIEW COMPARISON:  04/24/2023 and 04/20/2023 as well as 11/05/2022 FINDINGS: Lungs are adequately inflated without acute airspace consolidation or effusion. No pneumothorax. Possible small nodular opacity over the right mid to lower lung. Cardiomediastinal silhouette and remainder of the exam is unchanged. IMPRESSION: 1. No acute cardiopulmonary disease. 2. Possible small nodular opacity over the right mid to lower lung. Recommend follow-up chest radiograph 3 months. If persistent, recommend noncontrast chest CT for further evaluation. Electronically Signed   By: Toribio Agreste M.D.   On: 11/01/2023 10:50    There are no new results to review at this time.  Previous records (including but not limited to H&P, progress notes, nursing notes, TOC management) were reviewed in assessment of this patient.  Labs: CBC: Recent Labs  Lab 11/01/23 1109 11/02/23 0515  11/03/23 0721 11/04/23 9346 11/06/23 0442 11/06/23 1159 11/06/23 1202 11/07/23 0442  WBC 8.4 7.9 10.3 7.7 6.7  --   --  6.0  NEUTROABS 5.9 5.3  --   --   --   --   --   --   HGB 13.1 12.2* 12.5* 11.7* 10.6* 9.2* 9.2* 10.8*  HCT 38.0* 36.2* 36.8* 35.7* 31.1* 27.0* 27.0* 32.6*  MCV 90.0 91.4 89.1 94.4 90.9  --   --  93.1  PLT 146* 145* 140* 107* 125*  --   --  130*   Basic Metabolic Panel: Recent Labs  Lab 11/02/23 1023 11/03/23 0418 11/04/23 0653 11/04/23 1648 11/05/23 0433 11/06/23 0442 11/06/23 1159 11/06/23 1202 11/07/23 0442  NA 132* 137 136 135 137 139 140 140 139  K 4.5 3.9 4.0 3.8 4.0 4.1 4.1 4.1 4.5  CL 105 106 110 105 104 108  --   --  109  CO2 15* 17* 15* 20* 21* 20*  --   --  18*  GLUCOSE 147* 135* 156* 192* 132* 123*  --   --  116*  BUN 51* 57* 54* 52* 49* 44*  --   --  34*  CREATININE 3.07* 3.28* 2.96* 2.98* 2.87* 2.71*  --   --  2.55*  CALCIUM  8.0* 8.3* 8.1* 8.4* 8.5* 8.4*  --   --  8.5*  MG 2.8* 2.7* 2.7*  --  2.8* 2.7*  --   --  2.4  PHOS 4.6  --   --   --   --   --   --   --   --    Liver Function Tests: Recent Labs  Lab 11/01/23 1109 11/02/23 1023 11/03/23 0418  AST 29 21 19   ALT 10 12 11   ALKPHOS 101 77 70  BILITOT 0.5 1.0 0.7  PROT 6.9 6.2* 6.3*  ALBUMIN 3.7 3.2* 3.2*   CBG: Recent Labs  Lab 11/06/23 0735 11/06/23 1830 11/06/23 2107 11/07/23 0822 11/07/23 1222  GLUCAP 134* 107* 154* 121* 230*    Scheduled Meds:  amitriptyline   50 mg Oral QHS   amLODipine   5 mg Oral Daily   aspirin EC  81 mg Oral Daily   [START ON 11/08/2023] chlorhexidine   15 mL Mouth/Throat Once   Chlorhexidine  Gluconate Cloth  6 each Topical Once   And   Chlorhexidine  Gluconate Cloth  6 each Topical Once   feeding supplement  237 mL Oral BID BM   heparin  injection (subcutaneous)  5,000 Units Subcutaneous Q12H   hydrALAZINE   50 mg Oral Q8H   insulin  aspart  0-15 Units Subcutaneous TID WC   melatonin  3 mg Oral QHS   [START  ON 11/08/2023] metoprolol  tartrate   12.5 mg Oral Once   pantoprazole   40 mg Oral Daily   polyethylene glycol  17 g Oral Daily   rOPINIRole   0.25 mg Oral TID   rosuvastatin   40 mg Oral Daily   sodium chloride  flush  3 mL Intravenous Q12H   Continuous Infusions:  sodium chloride      sodium chloride      PRN Meds:.sodium chloride , artificial tears, bisacodyl , ipratropium-albuterol , oxyCODONE , sodium chloride  flush, SUMAtriptan , zolpidem   Family Communication: None at bedside  Disposition: Status is: Inpatient Remains inpatient appropriate because: Valve surgery     Time spent: 38 minutes  Length of inpatient stay: 5 days  Author: Carliss LELON Canales, DO 11/07/2023 12:39 PM  For on call review [email or web address].

## 2023-11-07 NOTE — Progress Notes (Signed)
 Right upper ext artery limited duplex  has been completed. Refer to [facility] under chart review to view preliminary results.   11/07/2023  2:44 PM Tranesha Lessner, Ricka BIRCH

## 2023-11-07 NOTE — Consult Note (Signed)
 Hospital Consult    Reason for Consult: Pain of the right antecubital fossa status post cardiac cath Requesting Physician: Hospital medicine MRN #:  [id number]  History of Present Illness: This is a 78 y.o. male status post cardiac cath whom I was called about overnight with pain at the antecubital fossa.  He was accessed via a right sided radial approach. On exam, Michael Bender is doing well.  Denies sensorimotor deficits.  Noted pain in the antecubital fossa at rest.  Noted no range of motion difficulties.  Past Medical History:  Diagnosis Date   Acute combined systolic and diastolic heart failure (HCC) 04/22/2023   Acute on chronic diastolic CHF (congestive heart failure) (HCC) 04/20/2023   AKI (acute kidney injury) 10/28/2022   Arthritis    Back   Balance problems 02/21/2023   Blood transfusion    as a child   Cachexia 05/03/2022   Added based on temporal wasting noted on exam, Body mass index is 19.92 kg/m. At 05/03/2022 encounter     Cervical discitis 11/10/2022   Chronic back pain    COPD (chronic obstructive pulmonary disease) (HCC)    Disturbance of skin sensation 05/22/2018   Effusion of right knee 02/21/2023   Effusion of right knee joint 01/16/2023   Elevated troponin 04/22/2023   Essential hypertension 03/05/2007   Qualifier: Diagnosis of   By: Krystal MD, Reyes LABOR      Finger pain, left 11/08/2022   Gastric erosion    Gastritis and gastroduodenitis    Gastrointestinal hemorrhage with melena 11/20/2018   GIB (gastrointestinal bleeding) 07/18/2019   Hardware complicating wound infection 11/10/2022   Headache(784.0)    last one 6 months ago   Hemorrhoids, internal    High risk medication use 03/03/2022   History of duodenal ulcer    History of fusion of cervical spine 03/03/2022   With persistent cervical pain and palpable screws  Led to disability  History of attempt to dig furrow in skull to fix it  Last surgery 1992     History of lumbar fusion 03/03/2022    RE-OPERATIVE DIAGNOSIS:  lumbar stenosis synovial cyst lumbar spondylosis spondylolisthesis lumbar radiculoapthy L4/5   PROCEDURE:  Procedure(s): POSTERIOR LUMBAR FUSION 1 LEVEL with resection of synovial cyst     History of upper gastrointestinal bleeding 03/03/2022   Duodenal ulcer 2021 Dr. Avram   HLD (hyperlipidemia)    Hypertension    Hypokalemia 04/22/2023   Hypomagnesemia 04/22/2023   Infected blister of left index finger 11/07/2022   Intractable pain 03/03/2022   Loss of weight    Wt Readings from Last 10 Encounters:  04/05/22  146 lb (66.2 kg)  03/03/22  143 lb 9.6 oz (65.1 kg)  07/14/21  153 lb (69.4 kg)  12/14/20  147 lb 12.8 oz (67 kg)  11/13/19  154 lb (69.9 kg)  09/16/19  146 lb (66.2 kg)  08/06/19  146 lb (66.2 kg)  07/19/19  144 lb 13.5 oz (65.7 kg)  07/17/19  148 lb (67.1 kg)  07/09/19  147 lb 9.6 oz (67 kg)         MSSA bacteremia 11/07/2022   Neck rigidity    post cervical fusion   Nocturia    PAIN, CHRONIC NEC 10/06/2006   Qualifier: Diagnosis of   By: Krystal RN, Leeroy       Pneumonia    Prostate disease    Right sided temporal headache 05/22/2018   S/P cervical spinal fusion 03/03/2022   With persistent cervical pain  and palpable screws Led to disability History of attempt to dig furrow in skull to fix it Last surgery 1992   Senile ecchymosis 01/21/2019   Septic arthritis of wrist, left (HCC) 11/08/2022   Septic infrapatellar bursitis of right knee 11/07/2022   Staphylococcal Arthritis of Right Knee (Updated 01/30/2023) MRI 01/17/2023 shows worsening findings:  Worsening tear of posterior horn medial meniscus with large radial component Worsening subcortical stress fracture/osteochondral lesion of medial femoral condyle New subcortical stress fracture/osteochondral lesion of medial tibial plateau Moderate-to-large effusion with worsened synovitis    Staphylococcal arthritis of left wrist (HCC) 11/07/2022   Staphylococcal arthritis of right knee (HCC)  11/08/2022   Syncope 11/05/2022   Underweight on examination 05/03/2022    Past Surgical History:  Procedure Laterality Date   BIOPSY  07/19/2019   Procedure: BIOPSY;  Surgeon: San Sandor GAILS, DO;  Location: [facility];  Service: Gastroenterology;;   CERVICAL FUSION  1992   C2/C 3  four surgeries   COLONOSCOPY     ESOPHAGOGASTRODUODENOSCOPY  07/20/2011   Procedure: ESOPHAGOGASTRODUODENOSCOPY (EGD);  Surgeon: Lupita FORBES Commander, MD;  Location: [facility];  Service: Endoscopy;  Laterality: N/A;   ESOPHAGOGASTRODUODENOSCOPY (EGD) WITH PROPOFOL  N/A 07/19/2019   Procedure: ESOPHAGOGASTRODUODENOSCOPY (EGD) WITH PROPOFOL ;  Surgeon: San Sandor GAILS, DO;  Location: [facility];  Service: Gastroenterology;  Laterality: N/A;   I & D EXTREMITY Left 11/09/2022   Procedure: IRRIGATION AND DEBRIDEMENT LEFT WRIST;  Surgeon: Arlinda Buster, MD;  Location: [facility];  Service: Orthopedics;  Laterality: Left;   SAVORY DILATION  07/20/2011   Procedure: SAVORY DILATION;  Surgeon: Lupita FORBES Commander, MD;  Location: [facility];  Service: Endoscopy;  Laterality: N/A;   SPINAL FUSION  05/06/11   TRANSESOPHAGEAL ECHOCARDIOGRAM (CATH LAB) N/A 07/24/2023   Procedure: TRANSESOPHAGEAL ECHOCARDIOGRAM;  Surgeon: Pietro Redell RAMAN, MD;  Location: [facility] INVASIVE CV LAB;  Service: Cardiovascular;  Laterality: N/A;    Allergies  Allergen Reactions   Nubain [Nalbuphine Hcl]     Muscle contraction    Prior to Admission medications   Medication Sig Start Date End Date Taking? Authorizing Provider  amitriptyline  (ELAVIL ) 50 MG tablet Take 1 tablet (50 mg total) by mouth at bedtime. 03/22/23  Yes [name], MD  amLODipine  (NORVASC ) 5 MG tablet Take 1 tablet (5 mg total) by mouth daily. 04/25/23  Yes Cheryle Page, MD  bisoprolol  (ZEBETA ) 5 MG tablet Take 1 tablet (5 mg total) by mouth daily. 07/20/23  Yes Fountain, Madison L, NP  cyclobenzaprine  (FLEXERIL ) 10 MG tablet Take 1 tablet (10 mg total) by mouth 3 (three) times  daily as needed. 03/22/23  Yes [name], MD  ipratropium-albuterol  (DUONEB) 0.5-2.5 (3) MG/3ML SOLN Take 3 mLs by nebulization every 4 (four) hours as needed. 04/19/23  Yes [name], MD  Melatonin 1 MG CAPS Take 1 capsule (1 mg total) by mouth at bedtime. 10/12/23  Yes [name], MD  metoprolol  succinate (TOPROL -XL) 25 MG 24 hr tablet Take 25 mg by mouth daily. 10/17/23  Yes [provider]  ondansetron  (ZOFRAN -ODT) 4 MG disintegrating tablet Take 1 tablet (4 mg total) by mouth every 8 (eight) hours as needed for nausea or vomiting. 10/25/23  Yes [name], MD  Oxycodone  HCl 10 MG TABS Take 1 tablet (10 mg total) by mouth every 8 (eight) hours as needed. 10/25/23  Yes [name], MD  pantoprazole  (PROTONIX ) 40 MG tablet Take 1 tablet (40 mg total) by mouth daily. 05/03/23  Yes Jesus,  Bernardino MATSU, MD  polyethylene glycol (MIRALAX  / GLYCOLAX ) 17 g packet Take 17 g by mouth daily as needed for moderate constipation. 11/02/22  Yes Cheryle Page, MD  rOPINIRole  (REQUIP ) 0.25 MG tablet Take 1 tablet (0.25 mg total) by mouth 3 (three) times daily. Patient taking differently: Take 0.25 mg by mouth 3 (three) times daily as needed (restless leg). 10/12/23  Yes [name], MD  rosuvastatin  (CRESTOR ) 40 MG tablet TAKE 1 TABLET BY MOUTH DAILY. REPLACES ATORVASTATIN  (STOP ATORVASTATIN  IF STILL TAKING) 04/13/23  Yes [name], MD  sacubitril -valsartan  (ENTRESTO ) 24-26 MG Take 1 tablet by mouth 2 (two) times daily. Patient taking differently: Take 0.5 tablets by mouth daily. 09/01/23  Yes Fountain, Madison L, NP  Semaglutide ,0.25 or 0.5MG /DOS, (OZEMPIC , 0.25 OR 0.5 MG/DOSE,) 2 MG/3ML SOPN Inject 0.25 mg into the skin once a week. 10/25/23  Yes [name], MD  spironolactone  (ALDACTONE ) 25 MG tablet Take 25 mg by mouth daily. 09/12/23  Yes [provider]  SUMAtriptan  (IMITREX ) 50 MG tablet TAKE 1 TABLET (50 MG TOTAL) BY MOUTH DAILY. MAY REPEAT IN 2 HOURS IF  HEADACHE PERSISTS OR RECURS. 11/18/22  Yes [name], MD  bisacodyl  5 MG EC tablet Take 1 tablet (5 mg total) by mouth daily as needed for moderate constipation. Patient not taking: Reported on 11/02/2023 10/25/23   [name], MD  Blood Glucose Monitoring Suppl DEVI 1 each by Does not apply route in the morning, at noon, and at bedtime. May substitute to any manufacturer covered by patient's insurance. 10/12/23   [name], MD  cefadroxil  (DURICEF) 500 MG capsule Take 500 mg by mouth 2 (two) times daily. Patient not taking: Reported on 11/02/2023 07/14/23   [provider]  Continuous Glucose Sensor (FREESTYLE LIBRE 3 PLUS SENSOR) MISC Change sensor every 15 days. 10/12/23   [name], MD  FeFum-FePoly-FA-B Cmp-C-Biot (INTEGRA PLUS ) CAPS Take 1 capsule by mouth daily. Patient not taking: Reported on 11/02/2023 06/29/23   Sherrod Sherrod, MD  Glucose Blood (BLOOD GLUCOSE TEST STRIPS) STRP 1 each by In Vitro route in the morning, at noon, and at bedtime. May substitute to any manufacturer covered by patient's insurance. 10/12/23 11/11/23  [name], MD  hydrALAZINE  (APRESOLINE ) 50 MG tablet Take 1 tablet (50 mg total) by mouth every 8 (eight) hours. Patient not taking: Reported on 11/02/2023 04/24/23   Cheryle Page, MD  isosorbide  mononitrate (IMDUR ) 60 MG 24 hr tablet Take 1 tablet (60 mg total) by mouth daily. Patient not taking: Reported on 11/02/2023 04/25/23   Cheryle Page, MD  JARDIANCE  10 MG TABS tablet Take 10 mg by mouth every morning. Patient not taking: Reported on 11/02/2023 10/14/23   [provider]  Lancet Device MISC 1 each by Does not apply route in the morning, at noon, and at bedtime. May substitute to any manufacturer covered by patient's insurance. 10/12/23 11/11/23  [name], MD  Lancets Misc. MISC 1 each by Does not apply route in the morning, at noon, and at bedtime. May substitute to any manufacturer covered by patient's  insurance. 10/12/23 11/11/23  [name], MD  predniSONE (DELTASONE) 10 MG tablet Take 10 mg by mouth 3 (three) times daily. Patient not taking: Reported on 11/02/2023 10/03/23   [provider]  promethazine  (PHENERGAN ) 25 MG suppository Place 1 suppository (25 mg total) rectally every 6 (six) hours as needed for nausea or vomiting. Patient not taking: Reported on 11/02/2023 10/25/23  [name], MD  Respiratory Therapy Supplies (NEBULIZER/TUBING/MOUTHPIECE) KIT 1 each by Does not apply route every 4 (four) hours as needed. 04/19/23   [name], MD  Respiratory Therapy Supplies (NEBULIZER/TUBING/MOUTHPIECE) KIT 1 each by Does not apply route every 4 (four) hours as needed. 04/19/23   [name], MD  sodium zirconium cyclosilicate  (LOKELMA ) 10 g PACK packet Take 10 g by mouth daily. Patient not taking: Reported on 11/02/2023 09/01/23   [name], NP    Social History   Socioeconomic History   Marital status: Widowed    Spouse name: Not on file   Number of children: 2   Years of education: Not on file   Highest education level: High school graduate  Occupational History   Occupation: Disabled  Tobacco Use   Smoking status: Former    Current packs/day: 0.00    Types: Cigarettes    Start date: 12/12/1978    Quit date: 12/12/2018    Years since quitting: 4.9   Smokeless tobacco: Never  Vaping Use   Vaping status: Never Used  Substance and Sexual Activity   Alcohol use: Not Currently    Alcohol/week: 2.0 standard drinks of alcohol    Types: 2 Shots of liquor per week   Drug use: No   Sexual activity: Not on file  Other Topics Concern   Not on file  Social History Narrative   Patient is divorced 2 sons he is a retired Engineer, 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  He drinks about a liter of caffeinated drinks a day past smoker no drug use no alcohol.   Social Drivers of Corporate investment banker Strain: Low Risk   (04/24/2023)   Overall Financial Resource Strain (CARDIA)    Difficulty of Paying Living Expenses: Not very hard  Food Insecurity: No Food Insecurity (11/01/2023)   Hunger Vital Sign    Worried About Running Out of Food in the Last Year: Never true    Ran Out of Food in the Last Year: Never true  Transportation Needs: No Transportation Needs (11/01/2023)   PRAPARE - Administrator, Civil Service (Medical): No    Lack of Transportation (Non-Medical): No  Physical Activity: Insufficiently Active (10/13/2022)   Exercise Vital Sign    Days of Exercise per Week: 3 days    Minutes of Exercise per Session: 30 min  Stress: No Stress Concern Present (10/13/2022)   [employer] of Occupational Health - Occupational Stress Questionnaire    Feeling of Stress : Not at all  Social Connections: Socially Isolated (11/01/2023)   Social Connection and Isolation Panel    Frequency of Communication with Friends and Family: Twice a week    Frequency of Social Gatherings with Friends and Family: Twice a week    Attends Religious Services: Never    Database administrator or Organizations: No    Attends Banker Meetings: Never    Marital Status: Widowed  Intimate Partner Violence: Not At Risk (11/01/2023)   Humiliation, Afraid, Rape, and Kick questionnaire    Fear of Current or Ex-Partner: No    Emotionally Abused: No    Physically Abused: No    Sexually Abused: No   Family History  Problem Relation Age of Onset   Heart disease Mother    Heart attack Mother 51   Pancreatic cancer Father 54   Pancreatic cancer Brother    Anesthesia  problems Neg Hx    Colon cancer Neg Hx    Liver cancer Neg Hx    Stomach cancer Neg Hx    Esophageal cancer Neg Hx    Rectal cancer Neg Hx     ROS: Otherwise negative unless mentioned in HPI  Physical Examination  Vitals:   11/07/23 0410 11/07/23 0808  BP: (!) 152/57 (!) 137/54  Pulse: 93 90  Resp: 18   Temp: 98.2 F (36.8 C) 98.6 F  (37 C)  SpO2: 98% 99%   Body mass index is 19.09 kg/m.  General:  WDWN in NAD Gait: Not observed HENT: WNL, normocephalic Pulmonary: normal non-labored breathing, without Rales, rhonchi,  wheezing Cardiac: regular Abdomen:  soft, NT/ND, no masses Skin: without rashes Vascular Exam/Pulses: 2+ radial Extremities: without ischemic changes, without Gangrene , without cellulitis; without open wounds;  Musculoskeletal: no muscle wasting or atrophy  Neurologic: A&O X 3;  No focal weakness or paresthesias are detected; speech is fluent/normal Psychiatric:  The pt has Normal affect. Lymph:  Unremarkable  CBC    Component Value Date/Time   WBC 6.0 11/07/2023 0442   RBC 3.50 (L) 11/07/2023 0442   HGB 10.8 (L) 11/07/2023 0442   HGB 9.7 (L) 10/10/2023 0950   HCT 32.6 (L) 11/07/2023 0442   HCT 30.6 (L) 10/10/2023 0950   PLT 130 (L) 11/07/2023 0442   PLT 144 (L) 10/10/2023 0950   MCV 93.1 11/07/2023 0442   MCV 97 10/10/2023 0950   MCH 30.9 11/07/2023 0442   MCHC 33.1 11/07/2023 0442   RDW 14.0 11/07/2023 0442   RDW 13.1 10/10/2023 0950   LYMPHSABS 1.8 11/02/2023 0515   LYMPHSABS 2.6 08/14/2023 1037   MONOABS 0.6 11/02/2023 0515   EOSABS 0.1 11/02/2023 0515   EOSABS 0.7 (H) 08/14/2023 1037   BASOSABS 0.0 11/02/2023 0515   BASOSABS 0.1 08/14/2023 1037    BMET    Component Value Date/Time   NA 139 11/07/2023 0442   NA 132 (L) 10/10/2023 0950   K 4.5 11/07/2023 0442   CL 109 11/07/2023 0442   CO2 18 (L) 11/07/2023 0442   GLUCOSE 116 (H) 11/07/2023 0442   BUN 34 (H) 11/07/2023 0442   BUN 38 (H) 10/10/2023 0950   CREATININE 2.55 (H) 11/07/2023 0442   CREATININE 1.77 (H) 06/29/2023 1126   CREATININE 1.87 (H) 06/21/2023 1209   CALCIUM  8.5 (L) 11/07/2023 0442   GFRNONAA 25 (L) 11/07/2023 0442   GFRNONAA 39 (L) 06/29/2023 1126   GFRAA >60 07/20/2019 0511    COAGS: Lab Results  Component Value Date   INR 1.1 07/19/2019      ASSESSMENT/PLAN: This is a 78 y.o. male with  pain in the right antecubital fossa status post cardiac cath.  No concern for vascular compromise.  Excellent, palpable pulse of the wrist. The brachial artery was not accessed.  In these cases, small arterial branches can be disrupted.  No significant hematoma.  I do not have an etiology for his pain at this time.  Will follow-up imaging.     Fonda FORBES Rim MD MS [facility] [phone] 11/07/2023  8:28 AM

## 2023-11-07 NOTE — Plan of Care (Signed)

## 2023-11-07 NOTE — Anesthesia Preprocedure Evaluation (Addendum)
 Anesthesia Evaluation  Patient identified by MRN, date of birth, ID band Patient awake    Reviewed: Allergy & Precautions, NPO status , Patient's Chart, lab work & pertinent test results, reviewed documented beta blocker date and time   History of Anesthesia Complications Negative for: history of anesthetic complications  Airway Mallampati: I  TM Distance: >3 FB Neck ROM: Full    Dental  (+) Edentulous Upper, Edentulous Lower   Pulmonary COPD,  COPD inhaler, former smoker   breath sounds clear to auscultation       Cardiovascular hypertension, Pt. on medications and Pt. on home beta blockers (-) angina + CAD (non-obstructive), + Peripheral Vascular Disease and +CHF (Entresto )  + Valvular Problems/Murmurs AI  Rhythm:Regular Rate:Normal + Systolic murmurs 89/07/7972 ECHO: EF 60-65%, normal LVF, Grade 1 DD, normal RVF, moderate AI   Neuro/Psych  Headaches    GI/Hepatic Neg liver ROS, PUD,GERD  Medicated and Controlled,,  Endo/Other  diabetes (glu 250), Oral Hypoglycemic Agents  Semaglutide   Renal/GU Renal InsufficiencyRenal disease     Musculoskeletal  (+) Arthritis ,    Abdominal   Peds  Hematology Hb 10.8, plt 130k   Anesthesia Other Findings   Reproductive/Obstetrics                              Anesthesia Physical Anesthesia Plan  ASA: 4  Anesthesia Plan: General   Post-op Pain Management:    Induction: Intravenous  PONV Risk Score and Plan: 2 and Treatment may vary due to age or medical condition  Airway Management Planned: Oral ETT  Additional Equipment: Arterial line, TEE, Ultrasound Guidance Line Placement and PA Cath  Intra-op Plan:   Post-operative Plan: Post-operative intubation/ventilation  Informed Consent: I have reviewed the patients History and Physical, chart, labs and discussed the procedure including the risks, benefits and alternatives for the proposed  anesthesia with the patient or authorized representative who has indicated his/her understanding and acceptance.       Plan Discussed with: CRNA and Surgeon  Anesthesia Plan Comments: (Discussed with pt and family)         Anesthesia Quick Evaluation

## 2023-11-07 NOTE — Care Management Important Message (Signed)
 Important Message  Patient Details  Name: Michael Bender MRN: [id number] Date of Birth: [birth year]   Important Message Given:  Yes - Medicare IM     Claretta Deed 11/07/2023, 3:30 PM

## 2023-11-07 NOTE — Hospital Course (Addendum)
 HPI: This is a 78 year old male with a past medical history of hypertension, hyperlipidemia, uncontrolled T2DM (last HgbA1C 8.4), paroxysmal atrial fibrillation not on anticoagulation due to falls, BPH, history of GI bleed due to duodenal ulcer, AKI on CKD stage IIIb, peripheral arterial disease, chronic iron deficiency anemia, prior tobacco abuse quit 5 years ago, COPD, combined systolic and diastolic heart failure, and degenerative spine disease S/P multiple surgeries. CVA is listed in his problem list but patient denies stroke history. The patient presented to the hospital in September 2024 after multiple falls with traumatic rhabdomyolysis and acute kidney failure and then again October 2025 after multiple syncopal episodes. At that time he underwent I&D of the left wrist for septic arthritis and was started on IV antibiotics for MSSA sepsis. 2D echocardiogram on 11/07/22 showed a trileaflet aortic valve with no evidence of valvular abnormality and LVEF was 60-65%. Follow up TTE on 04/21/23 showed LVEF 40-45% with global hypokinesis but still with no valvular abnormality. Cardiac MRI on 06/06/23 showed multiple jets of aortic insufficiency with moderate to severe regurgitation. TEE on 07/24/23 showed LVEF 50-55%, mildly reduced right ventricular systolic function, an oscillating linear density (0.7-0.8cm) on the left coronary cusp of the aortic valve concerning for vegetation with severe aortic insufficiency, mild mitral valve regurgitation, and moderate (grade III) plaque of the descending aorta. Antibiotics were not started at this time per ID as it was felt the vegetation was related to previous infection. He was seen by Dr. Lucas in our office on 08/30/23 for cardiothoracic surgical consult. At that time he remained active without limitation, denied shortness of breath, fatigue, orthopnea, peripheral edema, and dizziness. Dr. Lucas reported the patient looked frail and chronically ill but felt he was a  candidate for aortic valve replacement to prevent progressive left ventricular dysfunction and worsening heart failure symptoms. The patient is not a TAVR candidate with pure aortic insufficiency. The patient decided to hold off on surgery and continued medical therapy and observation.    The patient presented to the ED on 10/01 with complaints of exertional dyspnea since Saturday. He reports he could barely walk 5 feet without getting short of breath and reports overall weakness. He denies chest pain, fever, cough, and peripheral edema. He reports he has not had an appetite since Saturday. His doctor put him on Ozempic  for diabetes but he has not taken that in 2 weeks. The family also reports he has not been eating or drinking much over the last few days. He had an AKI on CKD with creatinine upon arrival 3.0, proBNP 634 and troponin I (high sensitivity) 67>62 most likely due to demand ischemia. EKG was without acute ischemia and showed NSR with a prolonged Qtc. Blood cultures were collected  and show no growth to date. Echocardiogram 11/02/23 read LVEF 60-65% and mild aortic regurgitation but the aortic valve is not well visualized...unsure if correct? Upon ED arrival the patient was still declining surgery but the patient is now agreeable to surgery if he is a candidate.    The patient becomes hypoxic and extremely dyspneic when walking about 40 feet. He currently lives alone and his sons check on him. He reports he is able to complete his ADLs but has to sit and rest frequently. He continues to look very frail/cachectic and chronically ill.  He needed to undergo cardiac catheterization and this was not able to be done until 10/06 due to AKI on CKD (stage IIIb). Results showed mild diffuse calcific plaquing but no  significant stenoses. Dr. Daniel discussed the need for median sternotomy for aortic valve replacement and LA clip. Pre operative carotid duplex US  showed no significant internal carotid artery stenosis  bilaterally.  Hospital Course: Patient underwent an aortic valve replacement with a 21 mm Edwards Inspiris bioprosthetic valve.  Following the procedure, he was transferred to the surgical ICU in stable condition.  He was hemodynamically stable.  He was weaned from the ventilator using standard protocols and extubated by around 10 PM on the day of surgery.  At around 11 PM.  Patient was noted to have decreased level of consciousness, disorientation, and would not follow commands.  He had a right facial droop along with bilateral arm and lower extremity weakness.  He had right side neglect on exam.  A code stroke was initiated.  CT scan of the head and neck were negative for any acute findings.  The patient was seen by neurology.  He was loaded with Keppra for suspected seizure activity.  An EEG was showing no seizures or epileptiform discharges.  There was evidence of generalized cerebral dysfunction/encephalopathy likely related to sedation.  Mr. Ohern remained hemodynamically stable and was not requiring any vasopressor or inotropic support on postop day 1.  Low-dose metoprolol  was started.  His mental states improved significantly. He was in NSR and his pacing wires were removed without difficulty. Neurology recommended MRI of the brain which was performed on 11/10/2023 and showed moderately severe chronic small vessel ischemic disease but not acute abnormality. His creatinine level improved and he was started on lasix  to help facilitate diuresis. He developed atrial fibrillation with RVR, he was started on IV Amiodarone protocol. PICC line was placed since the patient continued to pull on his IV lines and there was concern of Amiodarone infiltration. Chest tubes and foley were removed on POD3 without complication. He developed an AKI on CKD stage 3b, diuresis was held. PT/OT recommended CIR at discharge, CIR began following. He continued to make steady progress and was later not felt to be a candidate  for CIR. He was stable for transfer from [facility] ICU to 4E for further convalescence. He continued to maintain sinus rhythm on oral Amiodarone and Lopressor . He has been ambulating on room air with good oxygenation. All wounds are clean, dry, healing without signs of infection. He has been tolerating a diet and has had a bowel movement. He is felt stable for discharge today.

## 2023-11-08 ENCOUNTER — Inpatient Hospital Stay (HOSPITAL_COMMUNITY)

## 2023-11-08 ENCOUNTER — Inpatient Hospital Stay (HOSPITAL_COMMUNITY): Payer: Self-pay | Admitting: Certified Registered Nurse Anesthetist

## 2023-11-08 ENCOUNTER — Inpatient Hospital Stay (HOSPITAL_COMMUNITY): Admission: EM | Disposition: A | Payer: Self-pay | Source: Home / Self Care

## 2023-11-08 ENCOUNTER — Encounter (HOSPITAL_COMMUNITY): Payer: Self-pay | Admitting: Internal Medicine

## 2023-11-08 ENCOUNTER — Other Ambulatory Visit: Payer: Self-pay

## 2023-11-08 DIAGNOSIS — R569 Unspecified convulsions: Secondary | ICD-10-CM

## 2023-11-08 DIAGNOSIS — I2510 Atherosclerotic heart disease of native coronary artery without angina pectoris: Secondary | ICD-10-CM

## 2023-11-08 DIAGNOSIS — N189 Chronic kidney disease, unspecified: Secondary | ICD-10-CM | POA: Diagnosis not present

## 2023-11-08 DIAGNOSIS — N179 Acute kidney failure, unspecified: Secondary | ICD-10-CM | POA: Diagnosis not present

## 2023-11-08 DIAGNOSIS — I351 Nonrheumatic aortic (valve) insufficiency: Secondary | ICD-10-CM

## 2023-11-08 DIAGNOSIS — I10 Essential (primary) hypertension: Secondary | ICD-10-CM

## 2023-11-08 DIAGNOSIS — Z9911 Dependence on respirator [ventilator] status: Secondary | ICD-10-CM

## 2023-11-08 DIAGNOSIS — Z87891 Personal history of nicotine dependence: Secondary | ICD-10-CM

## 2023-11-08 DIAGNOSIS — R739 Hyperglycemia, unspecified: Secondary | ICD-10-CM

## 2023-11-08 DIAGNOSIS — I350 Nonrheumatic aortic (valve) stenosis: Secondary | ICD-10-CM

## 2023-11-08 DIAGNOSIS — E871 Hypo-osmolality and hyponatremia: Secondary | ICD-10-CM

## 2023-11-08 DIAGNOSIS — I4891 Unspecified atrial fibrillation: Secondary | ICD-10-CM

## 2023-11-08 DIAGNOSIS — Z952 Presence of prosthetic heart valve: Secondary | ICD-10-CM

## 2023-11-08 HISTORY — PX: CLIPPING OF ATRIAL APPENDAGE: SHX5773

## 2023-11-08 HISTORY — PX: INTRAOPERATIVE TRANSESOPHAGEAL ECHOCARDIOGRAM: SHX5062

## 2023-11-08 HISTORY — PX: AORTIC VALVE REPLACEMENT: SHX41

## 2023-11-08 LAB — POCT I-STAT EG7
Acid-base deficit: 4 mmol/L — ABNORMAL HIGH (ref 0.0–2.0)
Bicarbonate: 21.6 mmol/L (ref 20.0–28.0)
Calcium, Ion: 1.26 mmol/L (ref 1.15–1.40)
HCT: 28 % — ABNORMAL LOW (ref 39.0–52.0)
Hemoglobin: 9.5 g/dL — ABNORMAL LOW (ref 13.0–17.0)
O2 Saturation: 56 %
Potassium: 4.1 mmol/L (ref 3.5–5.1)
Sodium: 142 mmol/L (ref 135–145)
TCO2: 23 mmol/L (ref 22–32)
pCO2, Ven: 40.2 mmHg — ABNORMAL LOW (ref 44–60)
pH, Ven: 7.338 (ref 7.25–7.43)
pO2, Ven: 31 mmHg — CL (ref 32–45)

## 2023-11-08 LAB — GLUCOSE, CAPILLARY
Glucose-Capillary: 250 mg/dL — ABNORMAL HIGH (ref 70–99)
Glucose-Capillary: 90 mg/dL (ref 70–99)
Glucose-Capillary: 118 mg/dL — ABNORMAL HIGH (ref 70–99)
Glucose-Capillary: 108 mg/dL — ABNORMAL HIGH (ref 70–99)
Glucose-Capillary: 125 mg/dL — ABNORMAL HIGH (ref 70–99)
Glucose-Capillary: 135 mg/dL — ABNORMAL HIGH (ref 70–99)
Glucose-Capillary: 118 mg/dL — ABNORMAL HIGH (ref 70–99)
Glucose-Capillary: 139 mg/dL — ABNORMAL HIGH (ref 70–99)

## 2023-11-08 LAB — POCT I-STAT 7, (LYTES, BLD GAS, ICA,H+H)
Acid-Base Excess: 0 mmol/L (ref 0.0–2.0)
Acid-Base Excess: 0 mmol/L (ref 0.0–2.0)
Acid-base deficit: 7 mmol/L — ABNORMAL HIGH (ref 0.0–2.0)
Acid-base deficit: 6 mmol/L — ABNORMAL HIGH (ref 0.0–2.0)
Acid-base deficit: 2 mmol/L (ref 0.0–2.0)
Bicarbonate: 17.4 mmol/L — ABNORMAL LOW (ref 20.0–28.0)
Bicarbonate: 19 mmol/L — ABNORMAL LOW (ref 20.0–28.0)
Bicarbonate: 24.7 mmol/L (ref 20.0–28.0)
Bicarbonate: 24 mmol/L (ref 20.0–28.0)
Bicarbonate: 22.6 mmol/L (ref 20.0–28.0)
Calcium, Ion: 1.17 mmol/L (ref 1.15–1.40)
Calcium, Ion: 0.92 mmol/L — ABNORMAL LOW (ref 1.15–1.40)
Calcium, Ion: 0.95 mmol/L — ABNORMAL LOW (ref 1.15–1.40)
Calcium, Ion: 1 mmol/L — ABNORMAL LOW (ref 1.15–1.40)
Calcium, Ion: 1.1 mmol/L — ABNORMAL LOW (ref 1.15–1.40)
HCT: 26 % — ABNORMAL LOW (ref 39.0–52.0)
HCT: 16 % — ABNORMAL LOW (ref 39.0–52.0)
HCT: 21 % — ABNORMAL LOW (ref 39.0–52.0)
HCT: 21 % — ABNORMAL LOW (ref 39.0–52.0)
HCT: 20 % — ABNORMAL LOW (ref 39.0–52.0)
Hemoglobin: 8.8 g/dL — ABNORMAL LOW (ref 13.0–17.0)
Hemoglobin: 5.4 g/dL — CL (ref 13.0–17.0)
Hemoglobin: 7.1 g/dL — ABNORMAL LOW (ref 13.0–17.0)
Hemoglobin: 7.1 g/dL — ABNORMAL LOW (ref 13.0–17.0)
Hemoglobin: 6.8 g/dL — CL (ref 13.0–17.0)
O2 Saturation: 100 %
O2 Saturation: 100 %
O2 Saturation: 100 %
O2 Saturation: 100 %
O2 Saturation: 100 %
Potassium: 3.6 mmol/L (ref 3.5–5.1)
Potassium: 4.6 mmol/L (ref 3.5–5.1)
Potassium: 4.2 mmol/L (ref 3.5–5.1)
Potassium: 4.6 mmol/L (ref 3.5–5.1)
Potassium: 4.3 mmol/L (ref 3.5–5.1)
Sodium: 142 mmol/L (ref 135–145)
Sodium: 140 mmol/L (ref 135–145)
Sodium: 142 mmol/L (ref 135–145)
Sodium: 142 mmol/L (ref 135–145)
Sodium: 142 mmol/L (ref 135–145)
TCO2: 18 mmol/L — ABNORMAL LOW (ref 22–32)
TCO2: 20 mmol/L — ABNORMAL LOW (ref 22–32)
TCO2: 26 mmol/L (ref 22–32)
TCO2: 25 mmol/L (ref 22–32)
TCO2: 24 mmol/L (ref 22–32)
pCO2 arterial: 31.1 mmHg — ABNORMAL LOW (ref 32–48)
pCO2 arterial: 33.9 mmHg (ref 32–48)
pCO2 arterial: 39.6 mmHg (ref 32–48)
pCO2 arterial: 34.9 mmHg (ref 32–48)
pCO2 arterial: 36.4 mmHg (ref 32–48)
pH, Arterial: 7.356 (ref 7.35–7.45)
pH, Arterial: 7.357 (ref 7.35–7.45)
pH, Arterial: 7.404 (ref 7.35–7.45)
pH, Arterial: 7.445 (ref 7.35–7.45)
pH, Arterial: 7.4 (ref 7.35–7.45)
pO2, Arterial: 499 mmHg — ABNORMAL HIGH (ref 83–108)
pO2, Arterial: 372 mmHg — ABNORMAL HIGH (ref 83–108)
pO2, Arterial: 303 mmHg — ABNORMAL HIGH (ref 83–108)
pO2, Arterial: 268 mmHg — ABNORMAL HIGH (ref 83–108)
pO2, Arterial: 283 mmHg — ABNORMAL HIGH (ref 83–108)

## 2023-11-08 LAB — CBC
HCT: 30.8 % — ABNORMAL LOW (ref 39.0–52.0)
HCT: 29.2 % — ABNORMAL LOW (ref 39.0–52.0)
Hemoglobin: 10.5 g/dL — ABNORMAL LOW (ref 13.0–17.0)
Hemoglobin: 9.9 g/dL — ABNORMAL LOW (ref 13.0–17.0)
MCH: 31.3 pg (ref 26.0–34.0)
MCH: 29.6 pg (ref 26.0–34.0)
MCHC: 34.1 g/dL (ref 30.0–36.0)
MCHC: 33.9 g/dL (ref 30.0–36.0)
MCV: 91.7 fL (ref 80.0–100.0)
MCV: 87.4 fL (ref 80.0–100.0)
Platelets: 129 K/uL — ABNORMAL LOW (ref 150–400)
Platelets: 91 K/uL — ABNORMAL LOW (ref 150–400)
RBC: 3.36 MIL/uL — ABNORMAL LOW (ref 4.22–5.81)
RBC: 3.34 MIL/uL — ABNORMAL LOW (ref 4.22–5.81)
RDW: 13.8 % (ref 11.5–15.5)
RDW: 16.6 % — ABNORMAL HIGH (ref 11.5–15.5)
WBC: 6.6 K/uL (ref 4.0–10.5)
WBC: 8.8 K/uL (ref 4.0–10.5)
nRBC: 0 % (ref 0.0–0.2)
nRBC: 0 % (ref 0.0–0.2)

## 2023-11-08 LAB — POCT I-STAT, CHEM 8
BUN: 30 mg/dL — ABNORMAL HIGH (ref 8–23)
BUN: 29 mg/dL — ABNORMAL HIGH (ref 8–23)
BUN: 27 mg/dL — ABNORMAL HIGH (ref 8–23)
BUN: 25 mg/dL — ABNORMAL HIGH (ref 8–23)
BUN: 24 mg/dL — ABNORMAL HIGH (ref 8–23)
Calcium, Ion: 1.24 mmol/L (ref 1.15–1.40)
Calcium, Ion: 1.23 mmol/L (ref 1.15–1.40)
Calcium, Ion: 0.9 mmol/L — ABNORMAL LOW (ref 1.15–1.40)
Calcium, Ion: 0.97 mmol/L — ABNORMAL LOW (ref 1.15–1.40)
Calcium, Ion: 1.1 mmol/L — ABNORMAL LOW (ref 1.15–1.40)
Chloride: 111 mmol/L (ref 98–111)
Chloride: 109 mmol/L (ref 98–111)
Chloride: 102 mmol/L (ref 98–111)
Chloride: 105 mmol/L (ref 98–111)
Chloride: 106 mmol/L (ref 98–111)
Creatinine, Ser: 2.2 mg/dL — ABNORMAL HIGH (ref 0.61–1.24)
Creatinine, Ser: 2.2 mg/dL — ABNORMAL HIGH (ref 0.61–1.24)
Creatinine, Ser: 1.7 mg/dL — ABNORMAL HIGH (ref 0.61–1.24)
Creatinine, Ser: 1.7 mg/dL — ABNORMAL HIGH (ref 0.61–1.24)
Creatinine, Ser: 1.7 mg/dL — ABNORMAL HIGH (ref 0.61–1.24)
Glucose, Bld: 75 mg/dL (ref 70–99)
Glucose, Bld: 94 mg/dL (ref 70–99)
Glucose, Bld: 149 mg/dL — ABNORMAL HIGH (ref 70–99)
Glucose, Bld: 131 mg/dL — ABNORMAL HIGH (ref 70–99)
Glucose, Bld: 151 mg/dL — ABNORMAL HIGH (ref 70–99)
HCT: 30 % — ABNORMAL LOW (ref 39.0–52.0)
HCT: 27 % — ABNORMAL LOW (ref 39.0–52.0)
HCT: 20 % — ABNORMAL LOW (ref 39.0–52.0)
HCT: 20 % — ABNORMAL LOW (ref 39.0–52.0)
HCT: 20 % — ABNORMAL LOW (ref 39.0–52.0)
Hemoglobin: 10.2 g/dL — ABNORMAL LOW (ref 13.0–17.0)
Hemoglobin: 9.2 g/dL — ABNORMAL LOW (ref 13.0–17.0)
Hemoglobin: 6.8 g/dL — CL (ref 13.0–17.0)
Hemoglobin: 6.8 g/dL — CL (ref 13.0–17.0)
Hemoglobin: 6.8 g/dL — CL (ref 13.0–17.0)
Potassium: 4.1 mmol/L (ref 3.5–5.1)
Potassium: 4.3 mmol/L (ref 3.5–5.1)
Potassium: 4.5 mmol/L (ref 3.5–5.1)
Potassium: 5.1 mmol/L (ref 3.5–5.1)
Potassium: 4.2 mmol/L (ref 3.5–5.1)
Sodium: 142 mmol/L (ref 135–145)
Sodium: 140 mmol/L (ref 135–145)
Sodium: 142 mmol/L (ref 135–145)
Sodium: 140 mmol/L (ref 135–145)
Sodium: 142 mmol/L (ref 135–145)
TCO2: 21 mmol/L — ABNORMAL LOW (ref 22–32)
TCO2: 21 mmol/L — ABNORMAL LOW (ref 22–32)
TCO2: 30 mmol/L (ref 22–32)
TCO2: 26 mmol/L (ref 22–32)
TCO2: 24 mmol/L (ref 22–32)

## 2023-11-08 LAB — ECHO INTRAOPERATIVE TEE
AV Mean grad: 4 mmHg
Height: 71 in
Weight: 2190.38 [oz_av]

## 2023-11-08 LAB — COOXEMETRY PANEL
Carboxyhemoglobin: 1.1 % (ref 0.5–1.5)
Methemoglobin: 1.6 % — ABNORMAL HIGH (ref 0.0–1.5)
O2 Saturation: 73.4 %
Total hemoglobin: 10 g/dL — ABNORMAL LOW (ref 12.0–16.0)

## 2023-11-08 LAB — MAGNESIUM: Magnesium: 2.4 mg/dL (ref 1.7–2.4)

## 2023-11-08 LAB — BASIC METABOLIC PANEL WITH GFR
Anion gap: 12 (ref 5–15)
BUN: 35 mg/dL — ABNORMAL HIGH (ref 8–23)
CO2: 19 mmol/L — ABNORMAL LOW (ref 22–32)
Calcium: 8.6 mg/dL — ABNORMAL LOW (ref 8.9–10.3)
Chloride: 106 mmol/L (ref 98–111)
Creatinine, Ser: 2.31 mg/dL — ABNORMAL HIGH (ref 0.61–1.24)
GFR, Estimated: 28 mL/min — ABNORMAL LOW (ref >60)
Glucose, Bld: 218 mg/dL — ABNORMAL HIGH (ref 70–99)
Potassium: 3.9 mmol/L (ref 3.5–5.1)
Sodium: 137 mmol/L (ref 135–145)

## 2023-11-08 LAB — FIBRINOGEN
Fibrinogen: 223 mg/dL (ref 210–475)
Fibrinogen: 164 mg/dL — ABNORMAL LOW (ref 210–475)

## 2023-11-08 LAB — PLATELET COUNT: Platelets: 68 K/uL — ABNORMAL LOW (ref 150–400)

## 2023-11-08 LAB — SURGICAL PCR SCREEN
MRSA, PCR: NEGATIVE
Staphylococcus aureus: NEGATIVE

## 2023-11-08 LAB — APTT: aPTT: 41 s — ABNORMAL HIGH (ref 24–36)

## 2023-11-08 LAB — HEMOGLOBIN AND HEMATOCRIT, BLOOD
HCT: 22.3 % — ABNORMAL LOW (ref 39.0–52.0)
Hemoglobin: 7.6 g/dL — ABNORMAL LOW (ref 13.0–17.0)

## 2023-11-08 LAB — PROTIME-INR
INR: 1.6 — ABNORMAL HIGH (ref 0.8–1.2)
Prothrombin Time: 19.8 s — ABNORMAL HIGH (ref 11.4–15.2)

## 2023-11-08 LAB — PREPARE RBC (CROSSMATCH)

## 2023-11-08 SURGERY — REPLACEMENT, AORTIC VALVE, OPEN
Anesthesia: General | Site: Esophagus

## 2023-11-08 MED ORDER — LORAZEPAM 2 MG/ML IJ SOLN: 1.0000 mg | Freq: Once | INTRAMUSCULAR | Status: AC

## 2023-11-08 MED ORDER — INSULIN REGULAR(HUMAN) IN NACL 100-0.9 UT/100ML-% IV SOLN: INTRAVENOUS | Status: AC

## 2023-11-08 MED ORDER — MIDAZOLAM HCL (PF) 10 MG/2ML IJ SOLN
INTRAMUSCULAR | Status: AC
  Filled 2023-11-08: qty 2

## 2023-11-08 MED ORDER — EPHEDRINE SULFATE (PRESSORS) 50 MG/ML IJ SOLN
INTRAMUSCULAR | Status: DC | PRN
Start: 2023-11-08 — End: 2023-11-08
  Administered 2023-11-08: 2.5 mg via INTRAVENOUS

## 2023-11-08 MED ORDER — PLASMA-LYTE A IV SOLN
INTRAVENOUS | Status: DC | PRN
  Administered 2023-11-08: 500 mL

## 2023-11-08 MED ORDER — DEXAMETHASONE SODIUM PHOSPHATE 10 MG/ML IJ SOLN
INTRAMUSCULAR | Status: AC
Start: 2023-11-08 — End: 2023-11-08
  Filled 2023-11-08: qty 1

## 2023-11-08 MED ORDER — BISACODYL 5 MG PO TBEC
10.0000 mg | DELAYED_RELEASE_TABLET | Freq: Every day | ORAL | Status: DC
  Administered 2023-11-10 – 2023-11-14 (×4): 10 mg via ORAL
  Filled 2023-11-08 (×5): qty 2

## 2023-11-08 MED ORDER — DEXTROSE 50 % IV SOLN: 0.0000 mL | INTRAVENOUS | Status: DC | PRN

## 2023-11-08 MED ORDER — PROPOFOL 10 MG/ML IV BOLUS
INTRAVENOUS | Status: AC
  Filled 2023-11-08: qty 20

## 2023-11-08 MED ORDER — ASPIRIN 325 MG PO TBEC: 325.0000 mg | DELAYED_RELEASE_TABLET | Freq: Every day | ORAL | Status: DC

## 2023-11-08 MED ORDER — SODIUM CHLORIDE 0.9% FLUSH
3.0000 mL | INTRAVENOUS | Status: DC | PRN
Start: 2023-11-09 — End: 2023-11-11

## 2023-11-08 MED ORDER — FENTANYL CITRATE (PF) 250 MCG/5ML IJ SOLN
INTRAMUSCULAR | Status: AC
  Filled 2023-11-08: qty 5

## 2023-11-08 MED ORDER — ASPIRIN 81 MG PO CHEW: 324.0000 mg | CHEWABLE_TABLET | Freq: Every day | ORAL | Status: DC

## 2023-11-08 MED ORDER — OXYCODONE HCL 5 MG PO TABS
5.0000 mg | ORAL_TABLET | ORAL | Status: DC | PRN
  Administered 2023-11-11 (×2): 5 mg via ORAL
  Filled 2023-11-08 (×2): qty 1

## 2023-11-08 MED ORDER — SODIUM CHLORIDE 0.9 % IV SOLN: 250.0000 mL | INTRAVENOUS | Status: AC

## 2023-11-08 MED ORDER — MIDAZOLAM HCL 2 MG/2ML IJ SOLN: 2.0000 mg | INTRAMUSCULAR | Status: DC | PRN

## 2023-11-08 MED ORDER — DOCUSATE SODIUM 100 MG PO CAPS
200.0000 mg | ORAL_CAPSULE | Freq: Every day | ORAL | Status: DC
  Administered 2023-11-10 – 2023-11-14 (×4): 200 mg via ORAL
  Filled 2023-11-08 (×5): qty 2

## 2023-11-08 MED ORDER — ROCURONIUM BROMIDE 10 MG/ML (PF) SYRINGE
PREFILLED_SYRINGE | INTRAVENOUS | Status: AC
  Filled 2023-11-08: qty 10

## 2023-11-08 MED ORDER — PANTOPRAZOLE SODIUM 40 MG PO TBEC
40.0000 mg | DELAYED_RELEASE_TABLET | Freq: Every day | ORAL | Status: DC
  Administered 2023-11-10 – 2023-11-15 (×6): 40 mg via ORAL
  Filled 2023-11-08 (×6): qty 1

## 2023-11-08 MED ORDER — TRAMADOL HCL 50 MG PO TABS: 50.0000 mg | ORAL_TABLET | ORAL | Status: DC | PRN

## 2023-11-08 MED ORDER — PROPOFOL 10 MG/ML IV BOLUS
INTRAVENOUS | Status: DC | PRN
Start: 2023-11-08 — End: 2023-11-08
  Administered 2023-11-08: 40 mg via INTRAVENOUS
  Administered 2023-11-08: 20 mg via INTRAVENOUS

## 2023-11-08 MED ORDER — THROMBIN (RECOMBINANT) 20000 UNITS EX SOLR
CUTANEOUS | Status: AC
  Filled 2023-11-08: qty 20000

## 2023-11-08 MED ORDER — BISACODYL 10 MG RE SUPP: 10.0000 mg | Freq: Every day | RECTAL | Status: DC

## 2023-11-08 MED ORDER — METOCLOPRAMIDE HCL 5 MG/ML IJ SOLN
10.0000 mg | Freq: Four times a day (QID) | INTRAMUSCULAR | Status: AC
  Administered 2023-11-08 – 2023-11-10 (×6): 10 mg via INTRAVENOUS
  Filled 2023-11-08 (×6): qty 2

## 2023-11-08 MED ORDER — PHENYLEPHRINE 80 MCG/ML (10ML) SYRINGE FOR IV PUSH (FOR BLOOD PRESSURE SUPPORT)
PREFILLED_SYRINGE | INTRAVENOUS | Status: DC | PRN
  Administered 2023-11-08 (×2): 80 ug via INTRAVENOUS
  Administered 2023-11-08: 160 ug via INTRAVENOUS
  Administered 2023-11-08: 40 ug via INTRAVENOUS

## 2023-11-08 MED ORDER — HEMOSTATIC AGENTS (NO CHARGE) OPTIME
TOPICAL | Status: DC | PRN
  Administered 2023-11-08: 1 via TOPICAL

## 2023-11-08 MED ORDER — CEFAZOLIN SODIUM-DEXTROSE 2-4 GM/100ML-% IV SOLN
2.0000 g | Freq: Three times a day (TID) | INTRAVENOUS | Status: AC
  Administered 2023-11-08 – 2023-11-10 (×6): 2 g via INTRAVENOUS
  Filled 2023-11-08 (×6): qty 100

## 2023-11-08 MED ORDER — EPHEDRINE 5 MG/ML INJ
INTRAVENOUS | Status: AC
Start: 2023-11-08 — End: 2023-11-08
  Filled 2023-11-08: qty 5

## 2023-11-08 MED ORDER — LEVETIRACETAM (KEPPRA) 500 MG/5 ML ADULT IV PUSH
60.0000 mg/kg | Freq: Once | INTRAVENOUS | Status: AC
  Administered 2023-11-08: 3750 mg via INTRAVENOUS
  Filled 2023-11-08: qty 40

## 2023-11-08 MED ORDER — LACTATED RINGERS IV SOLN: INTRAVENOUS | Status: DC | PRN

## 2023-11-08 MED ORDER — SODIUM CHLORIDE 0.9% FLUSH
3.0000 mL | Freq: Two times a day (BID) | INTRAVENOUS | Status: DC
  Administered 2023-11-09 – 2023-11-11 (×5): 3 mL via INTRAVENOUS

## 2023-11-08 MED ORDER — LIDOCAINE 2% (20 MG/ML) 5 ML SYRINGE
INTRAMUSCULAR | Status: AC
  Filled 2023-11-08: qty 5

## 2023-11-08 MED ORDER — ONDANSETRON HCL 4 MG/2ML IJ SOLN
INTRAMUSCULAR | Status: AC
  Filled 2023-11-08: qty 2

## 2023-11-08 MED ORDER — PHENYLEPHRINE 80 MCG/ML (10ML) SYRINGE FOR IV PUSH (FOR BLOOD PRESSURE SUPPORT)
PREFILLED_SYRINGE | INTRAVENOUS | Status: AC
Start: 2023-11-08 — End: 2023-11-08
  Filled 2023-11-08: qty 10

## 2023-11-08 MED ORDER — LACTATED RINGERS IV SOLN: INTRAVENOUS | Status: DC

## 2023-11-08 MED ORDER — ALBUMIN HUMAN 5 % IV SOLN: INTRAVENOUS | Status: DC | PRN

## 2023-11-08 MED ORDER — METOPROLOL TARTRATE 12.5 MG HALF TABLET: 12.5000 mg | ORAL_TABLET | Freq: Two times a day (BID) | ORAL | Status: DC

## 2023-11-08 MED ORDER — ACETAMINOPHEN 500 MG PO TABS
1000.0000 mg | ORAL_TABLET | Freq: Four times a day (QID) | ORAL | Status: AC
  Administered 2023-11-10 – 2023-11-13 (×11): 1000 mg via ORAL
  Filled 2023-11-08 (×12): qty 2

## 2023-11-08 MED ORDER — NOREPINEPHRINE 4 MG/250ML-% IV SOLN: 0.0000 ug/min | INTRAVENOUS | Status: DC

## 2023-11-08 MED ORDER — EPINEPHRINE HCL 5 MG/250ML IV SOLN IN NS: 0.0000 ug/min | INTRAVENOUS | Status: DC

## 2023-11-08 MED ORDER — MIDAZOLAM HCL (PF) 10 MG/2ML IJ SOLN
INTRAMUSCULAR | Status: AC
Start: 2023-11-08 — End: 2023-11-08
  Filled 2023-11-08: qty 2

## 2023-11-08 MED ORDER — LACTATED RINGERS IV SOLN
INTRAVENOUS | Status: AC
Start: 2023-11-08 — End: 2023-11-09

## 2023-11-08 MED ORDER — VANCOMYCIN HCL IN DEXTROSE 1-5 GM/200ML-% IV SOLN
1000.0000 mg | Freq: Once | INTRAVENOUS | Status: AC
Start: 2023-11-08 — End: 2023-11-09
  Administered 2023-11-08: 1000 mg via INTRAVENOUS
  Filled 2023-11-08: qty 200

## 2023-11-08 MED ORDER — PHENYLEPHRINE HCL-NACL 20-0.9 MG/250ML-% IV SOLN: 0.0000 ug/min | INTRAVENOUS | Status: DC

## 2023-11-08 MED ORDER — ONDANSETRON HCL 4 MG/2ML IJ SOLN
4.0000 mg | Freq: Four times a day (QID) | INTRAMUSCULAR | Status: DC | PRN
  Administered 2023-11-10 – 2023-11-11 (×2): 4 mg via INTRAVENOUS
  Filled 2023-11-08 (×2): qty 2

## 2023-11-08 MED ORDER — HEPARIN SODIUM (PORCINE) 1000 UNIT/ML IJ SOLN
INTRAMUSCULAR | Status: DC | PRN
  Administered 2023-11-08: 23000 [IU] via INTRAVENOUS

## 2023-11-08 MED ORDER — LORAZEPAM 2 MG/ML IJ SOLN: 2.0000 mg | Freq: Once | INTRAMUSCULAR | Status: DC

## 2023-11-08 MED ORDER — METOPROLOL TARTRATE 5 MG/5ML IV SOLN
2.5000 mg | INTRAVENOUS | Status: DC | PRN
  Administered 2023-11-09: 5 mg via INTRAVENOUS
  Filled 2023-11-08: qty 5

## 2023-11-08 MED ORDER — MORPHINE SULFATE (PF) 2 MG/ML IV SOLN: 1.0000 mg | INTRAVENOUS | Status: DC | PRN

## 2023-11-08 MED ORDER — ALBUMIN HUMAN 5 % IV SOLN
250.0000 mL | INTRAVENOUS | Status: DC | PRN
  Administered 2023-11-08 (×2): 12.5 g via INTRAVENOUS

## 2023-11-08 MED ORDER — CLEVIDIPINE BUTYRATE 0.5 MG/ML IV EMUL
INTRAVENOUS | Status: DC | PRN
  Administered 2023-11-08: .5 mg/h via INTRAVENOUS

## 2023-11-08 MED ORDER — LORAZEPAM 2 MG/ML IJ SOLN
INTRAMUSCULAR | Status: AC
  Administered 2023-11-08: 1 mg via INTRAVENOUS
  Filled 2023-11-08: qty 1

## 2023-11-08 MED ORDER — LACTATED RINGERS IV SOLN: INTRAVENOUS | Status: AC

## 2023-11-08 MED ORDER — THROMBIN 20000 UNITS EX SOLR
OROMUCOSAL | Status: DC | PRN
  Administered 2023-11-08 (×3): 4 mL via TOPICAL

## 2023-11-08 MED ORDER — PROTAMINE SULFATE 10 MG/ML IV SOLN
INTRAVENOUS | Status: DC | PRN
  Administered 2023-11-08: 30 mg via INTRAVENOUS
  Administered 2023-11-08: 200 mg via INTRAVENOUS
  Administered 2023-11-08: 50 mg via INTRAVENOUS

## 2023-11-08 MED ORDER — ORAL CARE MOUTH RINSE: 15.0000 mL | Freq: Once | OROMUCOSAL | Status: AC

## 2023-11-08 MED ORDER — 0.9 % SODIUM CHLORIDE (POUR BTL) OPTIME
TOPICAL | Status: DC | PRN
  Administered 2023-11-08: 1000 mL

## 2023-11-08 MED ORDER — ACETAMINOPHEN 160 MG/5ML PO SOLN
650.0000 mg | Freq: Once | ORAL | Status: AC
  Administered 2023-11-08: 650 mg
  Filled 2023-11-08: qty 20.3

## 2023-11-08 MED ORDER — ACETAMINOPHEN 160 MG/5ML PO SOLN
1000.0000 mg | Freq: Four times a day (QID) | ORAL | Status: DC
  Administered 2023-11-09: 1000 mg
  Filled 2023-11-08: qty 40.6

## 2023-11-08 MED ORDER — POLYETHYLENE GLYCOL 3350 17 G PO PACK: 17.0000 g | PACK | Freq: Every day | ORAL | Status: DC | PRN

## 2023-11-08 MED ORDER — DEXMEDETOMIDINE HCL IN NACL 400 MCG/100ML IV SOLN
0.0000 ug/kg/h | INTRAVENOUS | Status: DC
  Administered 2023-11-08: 0.6 ug/kg/h via INTRAVENOUS
  Filled 2023-11-08: qty 100

## 2023-11-08 MED ORDER — CHLORHEXIDINE GLUCONATE 0.12 % MT SOLN
15.0000 mL | OROMUCOSAL | Status: AC
  Administered 2023-11-08: 15 mL via OROMUCOSAL
  Filled 2023-11-08: qty 15

## 2023-11-08 MED ORDER — POTASSIUM CHLORIDE 10 MEQ/50ML IV SOLN
10.0000 meq | INTRAVENOUS | Status: AC
  Administered 2023-11-08 (×3): 10 meq via INTRAVENOUS

## 2023-11-08 MED ORDER — NITROGLYCERIN IN D5W 200-5 MCG/ML-% IV SOLN: 0.0000 ug/min | INTRAVENOUS | Status: DC

## 2023-11-08 MED ORDER — SODIUM CHLORIDE 0.45 % IV SOLN: INTRAVENOUS | Status: AC | PRN

## 2023-11-08 MED ORDER — ROCURONIUM BROMIDE 10 MG/ML (PF) SYRINGE
PREFILLED_SYRINGE | INTRAVENOUS | Status: DC | PRN
  Administered 2023-11-08: 30 mg via INTRAVENOUS
  Administered 2023-11-08: 70 mg via INTRAVENOUS
  Administered 2023-11-08: 50 mg via INTRAVENOUS
  Administered 2023-11-08 (×2): 30 mg via INTRAVENOUS

## 2023-11-08 MED ORDER — CLEVIDIPINE BUTYRATE 0.5 MG/ML IV EMUL
0.0000 mg/h | INTRAVENOUS | Status: DC
  Administered 2023-11-08: 4 mg/h via INTRAVENOUS
  Filled 2023-11-08: qty 100

## 2023-11-08 MED ORDER — SODIUM CHLORIDE 0.9 % IV SOLN: INTRAVENOUS | Status: DC

## 2023-11-08 MED ORDER — CALCIUM CHLORIDE 10 % IV SOLN
INTRAVENOUS | Status: DC | PRN
Start: 2023-11-08 — End: 2023-11-08
  Administered 2023-11-08: 300 mg via INTRAVENOUS
  Administered 2023-11-08: 200 mg via INTRAVENOUS

## 2023-11-08 MED ORDER — FENTANYL CITRATE (PF) 250 MCG/5ML IJ SOLN
INTRAMUSCULAR | Status: DC | PRN
  Administered 2023-11-08: 50 ug via INTRAVENOUS
  Administered 2023-11-08 (×2): 25 ug via INTRAVENOUS
  Administered 2023-11-08: 50 ug via INTRAVENOUS
  Administered 2023-11-08: 500 ug via INTRAVENOUS
  Administered 2023-11-08: 50 ug via INTRAVENOUS
  Administered 2023-11-08: 125 ug via INTRAVENOUS
  Administered 2023-11-08: 150 ug via INTRAVENOUS
  Administered 2023-11-08: 25 ug via INTRAVENOUS

## 2023-11-08 MED ORDER — METOPROLOL TARTRATE 25 MG/10 ML ORAL SUSPENSION
12.5000 mg | Freq: Two times a day (BID) | ORAL | Status: DC
  Administered 2023-11-08 – 2023-11-09 (×2): 12.5 mg
  Filled 2023-11-08 (×2): qty 10

## 2023-11-08 MED ORDER — NITROGLYCERIN 0.2 MG/ML ON CALL CATH LAB
INTRAVENOUS | Status: DC | PRN
  Administered 2023-11-08: 60 ug via INTRAVENOUS
  Administered 2023-11-08: 40 ug via INTRAVENOUS
  Administered 2023-11-08: 60 ug via INTRAVENOUS
  Administered 2023-11-08 (×2): 40 ug via INTRAVENOUS

## 2023-11-08 MED ORDER — ASPIRIN 81 MG PO CHEW
324.0000 mg | CHEWABLE_TABLET | Freq: Once | ORAL | Status: AC
  Administered 2023-11-08: 324 mg via ORAL
  Filled 2023-11-08: qty 4

## 2023-11-08 MED ORDER — MIDAZOLAM HCL (PF) 5 MG/ML IJ SOLN
INTRAMUSCULAR | Status: DC | PRN
  Administered 2023-11-08: 2 mg via INTRAVENOUS
  Administered 2023-11-08 (×2): 1 mg via INTRAVENOUS

## 2023-11-08 MED ORDER — CHLORHEXIDINE GLUCONATE 0.12 % MT SOLN
15.0000 mL | Freq: Once | OROMUCOSAL | Status: AC
  Administered 2023-11-08: 15 mL via OROMUCOSAL

## 2023-11-08 MED ORDER — MAGNESIUM SULFATE 4 GM/100ML IV SOLN
4.0000 g | Freq: Once | INTRAVENOUS | Status: AC
  Administered 2023-11-08: 4 g via INTRAVENOUS
  Filled 2023-11-08: qty 100

## 2023-11-08 MED ORDER — PANTOPRAZOLE SODIUM 40 MG IV SOLR
40.0000 mg | Freq: Every day | INTRAVENOUS | Status: AC
  Administered 2023-11-08 – 2023-11-09 (×2): 40 mg via INTRAVENOUS
  Filled 2023-11-08 (×2): qty 10

## 2023-11-08 MED ORDER — VASOPRESSIN 20 UNIT/ML IV SOLN
INTRAVENOUS | Status: AC
  Filled 2023-11-08: qty 1

## 2023-11-08 MED ORDER — SODIUM CHLORIDE 0.9 % IV SOLN: INTRAVENOUS | Status: AC

## 2023-11-08 SURGICAL SUPPLY — 68 items
ADAPTER CARDIO PERF ANTE/RETRO (ADAPTER) ×2 IMPLANT
BAG DECANTER FOR FLEXI CONT (MISCELLANEOUS) ×2 IMPLANT
BLADE CLIPPER SURG (BLADE) ×2 IMPLANT
BLADE STERNUM SYSTEM 6 (BLADE) ×2 IMPLANT
BLADE SURG 11 STRL SS (BLADE) IMPLANT
BLADE SURG 15 STRL LF DISP TIS (BLADE) ×2 IMPLANT
CANISTER SUCTION 3000ML PPV (SUCTIONS) ×2 IMPLANT
CANNULA AORTIC ROOT 9FR (CANNULA) ×2 IMPLANT
CANNULA GUNDRY RCSP 15FR (MISCELLANEOUS) ×2 IMPLANT
CANNULA MC2 2 STG 36/46 NON-V (CANNULA) IMPLANT
CANNULA NON VENT 20FR 12 (CANNULA) IMPLANT
CATH HEART VENT LEFT (CATHETERS) ×2 IMPLANT
CATH ROBINSON RED A/P 18FR (CATHETERS) ×6 IMPLANT
CATH THOR RT ANG 28F 9128 SOFT (CATHETERS) ×2 IMPLANT
CATH THORACIC 28FR (CATHETERS) IMPLANT
CATH THORACIC 36FR (CATHETERS) ×2 IMPLANT
CNTNR URN SCR LID CUP LEK RST (MISCELLANEOUS) ×2 IMPLANT
CONTAINER PROTECT SURGISLUSH (MISCELLANEOUS) ×4 IMPLANT
DEVICE ATRICLIP LAA PRCLPII 40 (Clip) IMPLANT
DEVICE SUT CK QUICK LOAD MINI (Prosthesis & Implant Heart) IMPLANT
DRAPE SRG 135X102X78XABS (DRAPES) ×2 IMPLANT
DRAPE WARM FLUID 44X44 (DRAPES) ×2 IMPLANT
DRSG COVADERM 4X14 (GAUZE/BANDAGES/DRESSINGS) ×2 IMPLANT
ELECTRODE REM PT RTRN 9FT ADLT (ELECTROSURGICAL) ×4 IMPLANT
FELT TEFLON 1X6 (MISCELLANEOUS) ×2 IMPLANT
GAUZE SPONGE 4X4 12PLY STRL (GAUZE/BANDAGES/DRESSINGS) ×2 IMPLANT
GLOVE BIO SURGEON STRL SZ 6.5 (GLOVE) ×4 IMPLANT
GOWN STRL REUS W/ TWL LRG LVL3 (GOWN DISPOSABLE) ×10 IMPLANT
GOWN STRL REUS W/ TWL XL LVL3 (GOWN DISPOSABLE) ×4 IMPLANT
HEMOSTAT POWDER SURGIFOAM 1G (HEMOSTASIS) ×6 IMPLANT
HEMOSTAT SURGICEL 2X14 (HEMOSTASIS) ×2 IMPLANT
KIT BASIN OR (CUSTOM PROCEDURE TRAY) ×2 IMPLANT
KIT SUCTION CATH 14FR (SUCTIONS) ×2 IMPLANT
KIT SUT CK MINI COMBO 4X17 (Prosthesis & Implant Heart) IMPLANT
KIT TURNOVER KIT B (KITS) ×2 IMPLANT
LEAD PACING MYOCARDI (MISCELLANEOUS) IMPLANT
LINE VENT (MISCELLANEOUS) IMPLANT
PACK E OPEN HEART (SUTURE) ×2 IMPLANT
PACK OPEN HEART (CUSTOM PROCEDURE TRAY) ×2 IMPLANT
PAD ELECT DEFIB RADIOL ZOLL (MISCELLANEOUS) ×2 IMPLANT
PENCIL BUTTON HOLSTER BLD 10FT (ELECTRODE) ×2 IMPLANT
POSITIONER HEAD DONUT 9IN (MISCELLANEOUS) ×2 IMPLANT
SET MPS 3-ND DEL (MISCELLANEOUS) IMPLANT
SOLN 0.9% NACL 1000 ML (IV SOLUTION) ×10 IMPLANT
SOLN 0.9% NACL POUR BTL 1000ML (IV SOLUTION) ×10 IMPLANT
SOLN STERILE WATER 1000 ML (IV SOLUTION) ×4 IMPLANT
SOLN STERILE WATER BTL 1000 ML (IV SOLUTION) ×4 IMPLANT
SUT BONE WAX W31G (SUTURE) ×2 IMPLANT
SUT EB EXC GRN/WHT 2-0 V-5 (SUTURE) ×4 IMPLANT
SUT ETHIBOND V-5 VALVE (SUTURE) IMPLANT
SUT ETHIBOND X763 2 0 SH 1 (SUTURE) ×4 IMPLANT
SUT PROLENE 4 0 SH DA (SUTURE) ×2 IMPLANT
SUT PROLENE 4-0 RB1 .5 CRCL 36 (SUTURE) ×2 IMPLANT
SUT SILK 1 MH (SUTURE) IMPLANT
SUT SILK 2 0SH CR/8 30 (SUTURE) ×2 IMPLANT
SUT STEEL 6MS V (SUTURE) ×2 IMPLANT
SUT STEEL STERNAL CCS#1 18IN (SUTURE) IMPLANT
SUT STEEL SZ 6 DBL 3X14 BALL (SUTURE) ×4 IMPLANT
SUT VIC AB 1 CTX36XBRD ANBCTR (SUTURE) ×4 IMPLANT
SUT VIC AB 2-0 CT1 TAPERPNT 27 (SUTURE) IMPLANT
SUT VIC AB 2-0 CTX 27 (SUTURE) IMPLANT
SYSTEM SAHARA CHEST DRAIN ATS (WOUND CARE) ×2 IMPLANT
TOWEL GREEN STERILE (TOWEL DISPOSABLE) ×2 IMPLANT
TOWEL GREEN STERILE FF (TOWEL DISPOSABLE) ×2 IMPLANT
TRAY FOLEY SLVR 16FR TEMP STAT (SET/KITS/TRAYS/PACK) ×2 IMPLANT
TUBE SUCT INTRACARD DLP 20F (MISCELLANEOUS) ×2 IMPLANT
UNDERPAD 30X36 HEAVY ABSORB (UNDERPADS AND DIAPERS) ×2 IMPLANT
VALVE AORTIC SZ27 INSP/RESIL (Valve) IMPLANT

## 2023-11-08 NOTE — Progress Notes (Deleted)
  Echocardiogram 2D Echocardiogram has been performed.  Michael Bender 11/08/2023, 2:19 PM

## 2023-11-08 NOTE — Progress Notes (Signed)
   [address], [place] [address]             [phone]   S/p AVR  Intubated, sedated  BP (!) 117/53   Pulse 80   Temp 98.7 F (37.1 C) (Oral)   Resp 16   Ht 5' 11 (1.803 m)   Wt 62.1 kg   SpO2 99%   BMI 19.09 kg/m   CI 2.2   Intake/Output Summary (Last 24 hours) at 11/08/2023 1819 Last data filed at 11/08/2023 1803 Gross per 24 hour  Intake 5966 ml  Output 3130 ml  Net 2836 ml   Minimal CT output  K 4.3, creatinine 1.7 Hct 20- transfused PLT 68K- 2 phereses given  Elspeth C. Kerrin, MD [facility] [phone]

## 2023-11-08 NOTE — Progress Notes (Signed)
  Echocardiogram Echocardiogram Transesophageal has been performed.  Michael Bender 11/08/2023, 2:20 PM

## 2023-11-08 NOTE — Consult Note (Signed)
 NAME:  Michael Bender, MRN:  [id number], DOB:  1945/12/10, LOS: 6 ADMISSION DATE:  11/01/2023, CONSULTATION DATE:  10/8 REFERRING MD:  Daniel MA CHIEF COMPLAINT:  AVR  History of Present Illness:  78 year old male with past medical history of diabetes, hypertension, hyperlipidemia,  history of GIB 2/2 duodenal ulcer 2021, CKD, atrial fibrillation, combined systolic and diastolic CHF, severe aortic insufficiency 2/2 MSSA endocarditis seeded from MSSA bacteremia 2/2 septic arthritis 11/2022 who presented to the emergency department on 11/01/23 with shortness of breath and poor appetite. He was found to be bradycardic in 50s. Felt, despite negative work up, that he would need repeat echo and admission given history of valve infection. Admitted to medicine. Echo 11/02/23 with LVEF 60-65%, G1DD, moderate AVR, calcified aortic root. TCTS consult who planned for [facility], PFT and plan for AVR+LAAL on day of ICU admission. 10/6 had R/LHC with severe AI with wide pulse pressure.   Pump time: 2h 76m Xclamp time: 1h 70m Cell saver: 660cc EBL:  Pertinent  Medical History  diabetes, hypertension, hyperlipidemia,  history of GIB 2/2 duodenal ulcer 2021, CKD, atrial fibrillation, combined systolic and diastolic CHF, severe aortic insufficiency 2/2 MSSA endocarditis seeded from MSSA bacteremia 2/2 septic arthritis 11/2022  Significant Hospital Events: Including procedures, antibiotic start and stop dates in addition to other pertinent events   10/1: admit for fatigue and repeat echo  10/2: severe AI, TCTS consult  10/8: AVR+LAAL   Interim History / Subjective:  Admit CCU s/p AVR + LAAL  Objective   Blood pressure (!) 124/57, pulse 78, temperature 98.7 F (37.1 C), temperature source Oral, resp. rate (!) 21, height 5' 11 (1.803 m), weight 62.1 kg, SpO2 96%. PAP: (17-21)/(2-14) 21/13      Intake/Output Summary (Last 24 hours) at 11/08/2023 1333 Last data filed at 11/08/2023 1321 Gross per 24 hour   Intake 1950 ml  Output 400 ml  Net 1550 ml   Filed Weights   11/06/23 0448 11/08/23 0944  Weight: 62.1 kg 62.1 kg    Examination: General: elderly male, post operative HENT: temporal wasting, ett, ogt  Lungs: clear bilaterally,  Cardiovascular: s2s2, no murmur, rub, gallop Abdomen: flat, soft  Extremities: no edema Neuro: sedated  GU: foley  PFT 11/02/23: FEV1 % pred 81, FVC 3.94 Resolved Hospital Problem list    Assessment & Plan:  Severe aortic insufficiency 2/2 MSSA endocarditis s/p bioprosthetic AVR  LAAL  HFpEF - on cleviprex gtt for MAP 65-85 - post-op management per TCTS - rapid wean per TCTS protocol - VAP/ PPI - CXR/ ABG, CBC, BMET now - con't weaning pressors as able to maintain MAP >70-90. Albumin boluses prn for low SV.  - tele monitoring/ pacing prn - mediastinal drains per TCTS - multimodal pain control per protocol- oxycodone , tramadol, morphine with bowel regimen - ASA, statin to resume next day  - metoprolol  BID once off pressors - complete post-op antibiotics - monitor electrolytes, replete PRN   Post-operative vent management  - full mechanical vent support - lung protective ventilation 6-8cc/kg Vt - VAP and PAD bundle in place  - titrate FiO2 to sat goal >92  - maintain peak/plats <30, driving pressures <84    Post-operative vasoplegia  - con't weaning pressors as able to maintain MAP >70-90. Albumin boluses prn for low SV.   Expected post-operative blood loss anemia  Expected post operative thrombocytopenia  - trend  - transfuse prn   AKI on CKD  Baseline 1.8-2.2  sCr 2.31, BUN 35 -  trend bmp, mag, phos - replete elytes - strict I&O - Avoid nephrotoxic agents, renally dose medications - ensure adequate renal perfusion   pAF - not anticoagulated at home  - tele monitoring   Diabetes  Ha1c 8.7 - insulin  gtt  - transition of per endotool  - cbg per endotool   HTN HLD - crestor  40mg  daily   History of migraines  - elavil   50mg  daily  - prn sumatriptain   Unclear if he has history of RLS, do not see diagnosis of Parkinsons  - requip  0.25mg  TID  Labs   CBC: Recent Labs  Lab 11/02/23 0515 11/03/23 0721 11/04/23 0653 11/06/23 0442 11/06/23 1159 11/07/23 0442 11/08/23 0606 11/08/23 1145 11/08/23 1149 11/08/23 1209  WBC 7.9 10.3 7.7 6.7  --  6.0 6.6  --   --   --   NEUTROABS 5.3  --   --   --   --   --   --   --   --   --   HGB 12.2* 12.5* 11.7* 10.6*   < > 10.8* 10.5* 9.5* 10.2* 8.8*  HCT 36.2* 36.8* 35.7* 31.1*   < > 32.6* 30.8* 28.0* 30.0* 26.0*  MCV 91.4 89.1 94.4 90.9  --  93.1 91.7  --   --   --   PLT 145* 140* 107* 125*  --  130* 129*  --   --   --    < > = values in this interval not displayed.    Basic Metabolic Panel: Recent Labs  Lab 11/02/23 1023 11/03/23 0418 11/04/23 9346 11/04/23 1648 11/05/23 0433 11/06/23 0442 11/06/23 1159 11/07/23 0442 11/08/23 0606 11/08/23 1145 11/08/23 1149 11/08/23 1209  NA 132*   < > 136 135 137 139   < > 139 137 142 142 142  K 4.5   < > 4.0 3.8 4.0 4.1   < > 4.5 3.9 4.1 4.1 3.6  CL 105   < > 110 105 104 108  --  109 106  --  111  --   CO2 15*   < > 15* 20* 21* 20*  --  18* 19*  --   --   --   GLUCOSE 147*   < > 156* 192* 132* 123*  --  116* 218*  --  75  --   BUN 51*   < > 54* 52* 49* 44*  --  34* 35*  --  30*  --   CREATININE 3.07*   < > 2.96* 2.98* 2.87* 2.71*  --  2.55* 2.31*  --  2.20*  --   CALCIUM  8.0*   < > 8.1* 8.4* 8.5* 8.4*  --  8.5* 8.6*  --   --   --   MG 2.8*   < > 2.7*  --  2.8* 2.7*  --  2.4 2.4  --   --   --   PHOS 4.6  --   --   --   --   --   --   --   --   --   --   --    < > = values in this interval not displayed.   GFR: Estimated Creatinine Clearance: 24.3 mL/min (A) (by C-G formula based on SCr of 2.2 mg/dL (H)). Recent Labs  Lab 11/01/23 1639 11/01/23 1948 11/02/23 0515 11/04/23 9346 11/06/23 0442 11/07/23 0442 11/08/23 0606  WBC  --   --    < > 7.7 6.7 6.0 6.6  LATICACIDVEN 1.1 1.6  --   --   --   --   --     < > = values in this interval not displayed.    Liver Function Tests: Recent Labs  Lab 11/02/23 1023 11/03/23 0418  AST 21 19  ALT 12 11  ALKPHOS 77 70  BILITOT 1.0 0.7  PROT 6.2* 6.3*  ALBUMIN 3.2* 3.2*   No results for input(s): LIPASE, AMYLASE in the last 168 hours. No results for input(s): AMMONIA in the last 168 hours.  ABG    Component Value Date/Time   PHART 7.356 11/08/2023 1209   PCO2ART 31.1 (L) 11/08/2023 1209   PO2ART 499 (H) 11/08/2023 1209   HCO3 17.4 (L) 11/08/2023 1209   TCO2 18 (L) 11/08/2023 1209   ACIDBASEDEF 7.0 (H) 11/08/2023 1209   O2SAT 100 11/08/2023 1209     Coagulation Profile: No results for input(s): INR, PROTIME in the last 168 hours.  Cardiac Enzymes: No results for input(s): CKTOTAL, CKMB, CKMBINDEX, TROPONINI in the last 168 hours.  HbA1C: Hgb A1c MFr Bld  Date/Time Value Ref Range Status  11/02/2023 06:45 PM 8.7 (H) 4.8 - 5.6 % Final    Comment:    (NOTE) Diagnosis of Diabetes The following HbA1c ranges recommended by the American Diabetes Association (ADA) may be used as an aid in the diagnosis of diabetes mellitus.  Hemoglobin             Suggested A1C NGSP%              Diagnosis  <5.7                   Non Diabetic  5.7-6.4                Pre-Diabetic  >6.4                   Diabetic  <7.0                   Glycemic control for                       adults with diabetes.    10/12/2023 12:41 PM 8.4 (H) 4.6 - 6.5 % Final    Comment:    Glycemic Control Guidelines for People with Diabetes:Non Diabetic:  <6%Goal of Therapy: <7%Additional Action Suggested:  >8%     CBG: Recent Labs  Lab 11/07/23 1222 11/07/23 1613 11/07/23 2029 11/08/23 0815 11/08/23 1119  GLUCAP 230* 157* 132* 250* 90    Review of Systems:   As above  Past Medical History:  He,  has a past medical history of Acute combined systolic and diastolic heart failure (HCC) (96/77/7974), Acute on chronic diastolic CHF  (congestive heart failure) (HCC) (04/20/2023), AKI (acute kidney injury) (10/28/2022), Arthritis, Balance problems (02/21/2023), Blood transfusion, Cachexia (05/03/2022), Cervical discitis (11/10/2022), Chronic back pain, COPD (chronic obstructive pulmonary disease) (HCC), Disturbance of skin sensation (05/22/2018), Effusion of right knee (02/21/2023), Effusion of right knee joint (01/16/2023), Elevated troponin (04/22/2023), Essential hypertension (03/05/2007), Finger pain, left (11/08/2022), Gastric erosion, Gastritis and gastroduodenitis, Gastrointestinal hemorrhage with melena (11/20/2018), GIB (gastrointestinal bleeding) (07/18/2019), Hardware complicating wound infection (11/10/2022), Headache(784.0), Hemorrhoids, internal, High risk medication use (03/03/2022), History of duodenal ulcer, History of fusion of cervical spine (03/03/2022), History of lumbar fusion (03/03/2022), History of upper gastrointestinal bleeding (03/03/2022), HLD (hyperlipidemia), Hypertension, Hypokalemia (04/22/2023), Hypomagnesemia (04/22/2023), Infected blister of left index finger (11/07/2022), Intractable pain (03/03/2022), Loss  of weight, MSSA bacteremia (11/07/2022), Neck rigidity, Nocturia, PAIN, CHRONIC NEC (10/06/2006), Pneumonia, Prostate disease, Right sided temporal headache (05/22/2018), S/P cervical spinal fusion (03/03/2022), Senile ecchymosis (01/21/2019), Septic arthritis of wrist, left (HCC) (11/08/2022), Septic infrapatellar bursitis of right knee (11/07/2022), Staphylococcal arthritis of left wrist (HCC) (11/07/2022), Staphylococcal arthritis of right knee (HCC) (11/08/2022), Syncope (11/05/2022), and Underweight on examination (05/03/2022).   Surgical History:   Past Surgical History:  Procedure Laterality Date   BIOPSY  07/19/2019   Procedure: BIOPSY;  Surgeon: San Sandor GAILS, DO;  Location: [facility];  Service: Gastroenterology;;   CERVICAL FUSION  1992   C2/C 3  four surgeries   COLONOSCOPY      ESOPHAGOGASTRODUODENOSCOPY  07/20/2011   Procedure: ESOPHAGOGASTRODUODENOSCOPY (EGD);  Surgeon: Lupita FORBES Commander, MD;  Location: [facility];  Service: Endoscopy;  Laterality: N/A;   ESOPHAGOGASTRODUODENOSCOPY (EGD) WITH PROPOFOL  N/A 07/19/2019   Procedure: ESOPHAGOGASTRODUODENOSCOPY (EGD) WITH PROPOFOL ;  Surgeon: San Sandor GAILS, DO;  Location: [facility];  Service: Gastroenterology;  Laterality: N/A;   I & D EXTREMITY Left 11/09/2022   Procedure: IRRIGATION AND DEBRIDEMENT LEFT WRIST;  Surgeon: Arlinda Buster, MD;  Location: [facility] OR;  Service: Orthopedics;  Laterality: Left;   RIGHT/LEFT HEART CATH AND CORONARY ANGIOGRAPHY N/A 11/06/2023   Procedure: RIGHT/LEFT HEART CATH AND CORONARY ANGIOGRAPHY;  Surgeon: Wonda Sharper, MD;  [facility];  Service: Cardiovascular;  Laterality: N/A;   SAVORY DILATION  07/20/2011   Procedure: SAVORY DILATION;  Surgeon: Lupita FORBES Commander, MD;  Location: [facility];  Service: Endoscopy;  Laterality: N/A;   SPINAL FUSION  05/06/11   TRANSESOPHAGEAL ECHOCARDIOGRAM (CATH LAB) N/A 07/24/2023   Procedure: TRANSESOPHAGEAL ECHOCARDIOGRAM;  Surgeon: Pietro Redell RAMAN, MD;  Location: [facility] INVASIVE CV LAB;  Service: Cardiovascular;  Laterality: N/A;     Social History:   reports that he quit smoking about 4 years ago. His smoking use included cigarettes. He started smoking about 44 years ago. He has never used smokeless tobacco. He reports that he does not currently use alcohol after a past usage of about 2.0 standard drinks of alcohol per week. He reports that he does not use drugs.   Family History:  His family history includes Heart attack (age of onset: 11) in his mother; Heart disease in his mother; Pancreatic cancer in his brother; Pancreatic cancer (age of onset: 55) in his father. There is no history of Anesthesia problems, Colon cancer, Liver cancer, Stomach cancer, Esophageal cancer, or Rectal cancer.   Allergies Allergies  Allergen Reactions    Nubain [Nalbuphine Hcl]     Muscle contraction     Home Medications  Prior to Admission medications   Medication Sig Start Date End Date Taking? Authorizing Provider  amitriptyline  (ELAVIL ) 50 MG tablet Take 1 tablet (50 mg total) by mouth at bedtime. 03/22/23  Yes [name], MD  amLODipine  (NORVASC ) 5 MG tablet Take 1 tablet (5 mg total) by mouth daily. 04/25/23  Yes Cheryle Page, MD  bisoprolol  (ZEBETA ) 5 MG tablet Take 1 tablet (5 mg total) by mouth daily. 07/20/23  Yes Fountain, Madison L, NP  cyclobenzaprine  (FLEXERIL ) 10 MG tablet Take 1 tablet (10 mg total) by mouth 3 (three) times daily as needed. 03/22/23  Yes [name], MD  ipratropium-albuterol  (DUONEB) 0.5-2.5 (3) MG/3ML SOLN Take 3 mLs by nebulization every 4 (four) hours as needed. 04/19/23  Yes [name], MD  Melatonin 1 MG CAPS Take 1 capsule (1 mg total) by mouth at bedtime. 10/12/23  Yes [name], MD  metoprolol  succinate (TOPROL -XL) 25 MG 24 hr tablet Take 25 mg by mouth daily. 10/17/23  Yes [provider]  ondansetron  (ZOFRAN -ODT) 4 MG disintegrating tablet Take 1 tablet (4 mg total) by mouth every 8 (eight) hours as needed for nausea or vomiting. 10/25/23  Yes [name], MD  Oxycodone  HCl 10 MG TABS Take 1 tablet (10 mg total) by mouth every 8 (eight) hours as needed. 10/25/23  Yes [name], MD  pantoprazole  (PROTONIX ) 40 MG tablet Take 1 tablet (40 mg total) by mouth daily. 05/03/23  Yes [name], MD  polyethylene glycol (MIRALAX  / GLYCOLAX ) 17 g packet Take 17 g by mouth daily as needed for moderate constipation. 11/02/22  Yes Cheryle Page, MD  rOPINIRole  (REQUIP ) 0.25 MG tablet Take 1 tablet (0.25 mg total) by mouth 3 (three) times daily. Patient taking differently: Take 0.25 mg by mouth 3 (three) times daily as needed (restless leg). 10/12/23  Yes [name], MD  rosuvastatin  (CRESTOR ) 40 MG tablet TAKE 1 TABLET BY MOUTH DAILY. REPLACES ATORVASTATIN  (STOP  ATORVASTATIN  IF STILL TAKING) 04/13/23  Yes [name], MD  sacubitril -valsartan  (ENTRESTO ) 24-26 MG Take 1 tablet by mouth 2 (two) times daily. Patient taking differently: Take 0.5 tablets by mouth daily. 09/01/23  Yes Fountain, Madison L, NP  Semaglutide ,0.25 or 0.5MG /DOS, (OZEMPIC , 0.25 OR 0.5 MG/DOSE,) 2 MG/3ML SOPN Inject 0.25 mg into the skin once a week. 10/25/23  Yes [name], MD  spironolactone  (ALDACTONE ) 25 MG tablet Take 25 mg by mouth daily. 09/12/23  Yes [provider]  SUMAtriptan  (IMITREX ) 50 MG tablet TAKE 1 TABLET (50 MG TOTAL) BY MOUTH DAILY. MAY REPEAT IN 2 HOURS IF HEADACHE PERSISTS OR RECURS. 11/18/22  Yes [name], MD  bisacodyl  5 MG EC tablet Take 1 tablet (5 mg total) by mouth daily as needed for moderate constipation. Patient not taking: Reported on 11/02/2023 10/25/23   [name], MD  Blood Glucose Monitoring Suppl DEVI 1 each by Does not apply route in the morning, at noon, and at bedtime. May substitute to any manufacturer covered by patient's insurance. 10/12/23   [name], MD  cefadroxil  (DURICEF) 500 MG capsule Take 500 mg by mouth 2 (two) times daily. Patient not taking: Reported on 11/02/2023 07/14/23   [provider]  Continuous Glucose Sensor (FREESTYLE LIBRE 3 PLUS SENSOR) MISC Change sensor every 15 days. 10/12/23   [name], MD  FeFum-FePoly-FA-B Cmp-C-Biot (INTEGRA PLUS ) CAPS Take 1 capsule by mouth daily. Patient not taking: Reported on 11/02/2023 06/29/23   Sherrod Sherrod, MD  Glucose Blood (BLOOD GLUCOSE TEST STRIPS) STRP 1 each by In Vitro route in the morning, at noon, and at bedtime. May substitute to any manufacturer covered by patient's insurance. 10/12/23 11/11/23  [name], MD  hydrALAZINE  (APRESOLINE ) 50 MG tablet Take 1 tablet (50 mg total) by mouth every 8 (eight) hours. Patient not taking: Reported on 11/02/2023 04/24/23   Cheryle Page, MD  isosorbide  mononitrate (IMDUR ) 60 MG 24 hr  tablet Take 1 tablet (60 mg total) by mouth daily. Patient not taking: Reported on 11/02/2023 04/25/23   Cheryle Page, MD  JARDIANCE  10 MG TABS tablet Take 10 mg by mouth every morning. Patient not taking: Reported on 11/02/2023 10/14/23   [provider]  Lancet Device MISC 1 each by Does not apply route in the morning, at noon, and at bedtime. May substitute to any manufacturer  covered by patient's insurance. 10/12/23 11/11/23  [name], MD  Lancets Misc. MISC 1 each by Does not apply route in the morning, at noon, and at bedtime. May substitute to any manufacturer covered by patient's insurance. 10/12/23 11/11/23  [name], MD  predniSONE (DELTASONE) 10 MG tablet Take 10 mg by mouth 3 (three) times daily. Patient not taking: Reported on 11/02/2023 10/03/23   [provider]  promethazine  (PHENERGAN ) 25 MG suppository Place 1 suppository (25 mg total) rectally every 6 (six) hours as needed for nausea or vomiting. Patient not taking: Reported on 11/02/2023 10/25/23   [name], MD  Respiratory Therapy Supplies (NEBULIZER/TUBING/MOUTHPIECE) KIT 1 each by Does not apply route every 4 (four) hours as needed. 04/19/23   [name], MD  Respiratory Therapy Supplies (NEBULIZER/TUBING/MOUTHPIECE) KIT 1 each by Does not apply route every 4 (four) hours as needed. 04/19/23   [name], MD  sodium zirconium cyclosilicate  (LOKELMA ) 10 g PACK packet Take 10 g by mouth daily. Patient not taking: Reported on 11/02/2023 09/01/23   [name], NP     Critical care time: 49    Tinnie FORBES Adolph [facility] 11/08/23 6:50 PM  Please see [email or web address] for pager details.  From 7A-7P if no response, please call [phone] After hours, please call ELink [phone]

## 2023-11-08 NOTE — Consult Note (Incomplete)
 NEUROLOGY CONSULT NOTE   Date of service: November 08, 2023 Patient Name: Michael Bender MRN:  [id number] DOB:  07-08-1945 Chief Complaint: Code stroke for seizure Requesting Provider: [name], MD  History of Present Illness  Michael Bender is a 78 y.o. male with hx of AKI on CKD, prior MSSA endocarditis complicated by severe aortic insufficiency, pAfibb, DM2, HTN, HLD who is admitted with decompensated HF and underwent aortic valve replacement today. Extubated post procedure and transferred to CVICU.  A code stroke was activated for noted new onset seizure with Left arm and L leg and L face twitching with L gaze. Had a seizure lasting 1 mins, followed shortly by a second seizure. Depite the seizure beign on the left side, patient is moving his L arm spontaneously with no movement in RUE. HE is post ictal and significantly drowsy.  He was given 1mg  of Ativan and loaded with Keppra 60mg /KG.  LKW: 2300 Modified rankin score: unclear. IV Thrombolysis: not offered, major surgery and platelet count today is less than 100k despite transfusion. EVT: ***  NIHSS components Score: Comment  1a Level of Conscious 0[x]  1[]  2[]  3[]      1b LOC Questions 0[]  1[]  2[x]       1c LOC Commands 0[]  1[x]  2[]       2 Best Gaze 0[x]  1[]  2[]       3 Visual 0[x]  1[]  2[]  3[]      4 Facial Palsy 0[]  1[x]  2[]  3[]      5a Motor Arm - left 0[]  1[x]  2[]  3[]  4[]  UN[]    5b Motor Arm - Right 0[]  1[]  2[]  3[]  4[x]  UN[]    6a Motor Leg - Left 0[]  1[]  2[]  3[x]  4[]  UN[]    6b Motor Leg - Right 0[]  1[]  2[]  3[x]  4[]  UN[]    7 Limb Ataxia 0[x]  1[]  2[]  UN[]      8 Sensory 0[]  1[]  2[x]  UN[]      9 Best Language 0[]  1[]  2[x]  3[]      10 Dysarthria 0[]  1[]  2[x]  UN[]      11 Extinct. and Inattention 0[]  1[x]  2[]       TOTAL:       ROS  Unable to ascertain due to limited speech, post ictal after seizure.  Past History   Past Medical History:  Diagnosis Date  . Acute combined systolic and diastolic heart failure (HCC)  96/77/7974  . Acute on chronic diastolic CHF (congestive heart failure) (HCC) 04/20/2023  . AKI (acute kidney injury) 10/28/2022  . Arthritis    Back  . Balance problems 02/21/2023  . Blood transfusion    as a child  . Cachexia 05/03/2022   Added based on temporal wasting noted on exam, Body mass index is 19.92 kg/m. At 05/03/2022 encounter    . Cervical discitis 11/10/2022  . Chronic back pain   . COPD (chronic obstructive pulmonary disease) (HCC)   . Disturbance of skin sensation 05/22/2018  . Effusion of right knee 02/21/2023  . Effusion of right knee joint 01/16/2023  . Elevated troponin 04/22/2023  . Essential hypertension 03/05/2007   Qualifier: Diagnosis of   By: Krystal MD, Reyes LABOR     . Finger pain, left 11/08/2022  . Gastric erosion   . Gastritis and gastroduodenitis   . Gastrointestinal hemorrhage with melena 11/20/2018  . GIB (gastrointestinal bleeding) 07/18/2019  . Hardware complicating wound infection 11/10/2022  . Headache(784.0)    last one 6 months ago  . Hemorrhoids, internal   . High risk medication use 03/03/2022  .  History of duodenal ulcer   . History of fusion of cervical spine 03/03/2022   With persistent cervical pain and palpable screws  Led to disability  History of attempt to dig furrow in skull to fix it  Last surgery 1992    . History of lumbar fusion 03/03/2022   RE-OPERATIVE DIAGNOSIS:  lumbar stenosis synovial cyst lumbar spondylosis spondylolisthesis lumbar radiculoapthy L4/5   PROCEDURE:  Procedure(s): POSTERIOR LUMBAR FUSION 1 LEVEL with resection of synovial cyst    . History of upper gastrointestinal bleeding 03/03/2022   Duodenal ulcer 2021 Dr. Avram  . HLD (hyperlipidemia)   . Hypertension   . Hypokalemia 04/22/2023  . Hypomagnesemia 04/22/2023  . Infected blister of left index finger 11/07/2022  . Intractable pain 03/03/2022  . Loss of weight    Wt Readings from Last 10 Encounters:  04/05/22  146 lb (66.2 kg)  03/03/22  143 lb  9.6 oz (65.1 kg)  07/14/21  153 lb (69.4 kg)  12/14/20  147 lb 12.8 oz (67 kg)  11/13/19  154 lb (69.9 kg)  09/16/19  146 lb (66.2 kg)  08/06/19  146 lb (66.2 kg)  07/19/19  144 lb 13.5 oz (65.7 kg)  07/17/19  148 lb (67.1 kg)  07/09/19  147 lb 9.6 oz (67 kg)        . MSSA bacteremia 11/07/2022  . Neck rigidity    post cervical fusion  . Nocturia   . PAIN, CHRONIC NEC 10/06/2006   Qualifier: Diagnosis of   By: Krystal OBIE Czar      . Pneumonia   . Prostate disease   . Right sided temporal headache 05/22/2018  . S/P cervical spinal fusion 03/03/2022   With persistent cervical pain and palpable screws Led to disability History of attempt to dig furrow in skull to fix it Last surgery 1992  . Senile ecchymosis 01/21/2019  . Septic arthritis of wrist, left (HCC) 11/08/2022  . Septic infrapatellar bursitis of right knee 11/07/2022   Staphylococcal Arthritis of Right Knee (Updated 01/30/2023) MRI 01/17/2023 shows worsening findings:  Worsening tear of posterior horn medial meniscus with large radial component Worsening subcortical stress fracture/osteochondral lesion of medial femoral condyle New subcortical stress fracture/osteochondral lesion of medial tibial plateau Moderate-to-large effusion with worsened synovitis   . Staphylococcal arthritis of left wrist (HCC) 11/07/2022  . Staphylococcal arthritis of right knee (HCC) 11/08/2022  . Syncope 11/05/2022  . Underweight on examination 05/03/2022    Past Surgical History:  Procedure Laterality Date  . BIOPSY  07/19/2019   Procedure: BIOPSY;  Surgeon: San Sandor GAILS, DO;  Location: [facility];  Service: Gastroenterology;;  . CERVICAL FUSION  1992   C2/C 3  four surgeries  . COLONOSCOPY    . ESOPHAGOGASTRODUODENOSCOPY  07/20/2011   Procedure: ESOPHAGOGASTRODUODENOSCOPY (EGD);  Surgeon: Lupita FORBES Avram, MD;  Location: [facility];  Service: Endoscopy;  Laterality: N/A;  . ESOPHAGOGASTRODUODENOSCOPY (EGD) WITH PROPOFOL  N/A 07/19/2019    Procedure: ESOPHAGOGASTRODUODENOSCOPY (EGD) WITH PROPOFOL ;  Surgeon: San Sandor GAILS, DO;  Location: [facility];  Service: Gastroenterology;  Laterality: N/A;  . I & D EXTREMITY Left 11/09/2022   Procedure: IRRIGATION AND DEBRIDEMENT LEFT WRIST;  Surgeon: Arlinda Buster, MD;  Location: [facility];  Service: Orthopedics;  Laterality: Left;  . RIGHT/LEFT HEART CATH AND CORONARY ANGIOGRAPHY N/A 11/06/2023   Procedure: RIGHT/LEFT HEART CATH AND CORONARY ANGIOGRAPHY;  Surgeon: Wonda Sharper, MD;  Location: [facility] INVASIVE CV LAB;  Service: Cardiovascular;  Laterality: N/A;  . SAVORY DILATION  07/20/2011  Procedure: SAVORY DILATION;  Surgeon: Lupita FORBES Commander, MD;  Location: [facility];  Service: Endoscopy;  Laterality: N/A;  . SPINAL FUSION  05/06/11  . TRANSESOPHAGEAL ECHOCARDIOGRAM (CATH LAB) N/A 07/24/2023   Procedure: TRANSESOPHAGEAL ECHOCARDIOGRAM;  Surgeon: Pietro Redell RAMAN, MD;  Location: [facility] INVASIVE CV LAB;  Service: Cardiovascular;  Laterality: N/A;    Family History: Family History  Problem Relation Age of Onset  . Heart disease Mother   . Heart attack Mother 71  . Pancreatic cancer Father 72  . Pancreatic cancer Brother   . Anesthesia problems Neg Hx   . Colon cancer Neg Hx   . Liver cancer Neg Hx   . Stomach cancer Neg Hx   . Esophageal cancer Neg Hx   . Rectal cancer Neg Hx     Social History  reports that he quit smoking about 4 years ago. His smoking use included cigarettes. He started smoking about 44 years ago. He has never used smokeless tobacco. He reports that he does not currently use alcohol after a past usage of about 2.0 standard drinks of alcohol per week. He reports that he does not use drugs.  Allergies  Allergen Reactions  . Nubain [Nalbuphine Hcl]     Muscle contraction    Medications   Current Facility-Administered Medications:  .  0.45 % sodium chloride  infusion, , Intravenous, Continuous PRN, Zimmerman, [name], PA-C, Paused at 11/08/23 1930 .   [START ON 11/09/2023] 0.9 %  sodium chloride  infusion, 250 mL, Intravenous, Continuous, Zimmerman, [name], PA-C .  0.9 %  sodium chloride  infusion, , Intravenous, Continuous, Zimmerman, [name], PA-C .  [START ON 11/09/2023] acetaminophen  (TYLENOL ) tablet 1,000 mg, 1,000 mg, Oral, Q6H **OR** [START ON 11/09/2023] acetaminophen  (TYLENOL ) 160 MG/5ML solution 1,000 mg, 1,000 mg, Per Tube, Q6H, Zimmerman, [name], PA-C .  albumin human 5 % solution 12.5 g, 250 mL, Intravenous, Q15 min PRN, Zimmerman, [name], PA-C, Stopped at 11/08/23 2046 .  amitriptyline  (ELAVIL ) tablet 50 mg, 50 mg, Oral, QHS, Zimmerman, [name], PA-C, 50 mg at 11/07/23 2148 .  artificial tears ophthalmic solution 1 drop, 1 drop, Both Eyes, PRN, Zimmerman, [name], PA-C, 1 drop at 11/05/23 2123 .  [START ON 11/09/2023] aspirin EC tablet 325 mg, 325 mg, Oral, Daily **OR** [START ON 11/09/2023] aspirin chewable tablet 324 mg, 324 mg, Per Tube, Daily, Zimmerman, [name], PA-C .  [START ON 11/09/2023] bisacodyl  (DULCOLAX) EC tablet 10 mg, 10 mg, Oral, Daily **OR** [START ON 11/09/2023] bisacodyl  (DULCOLAX) suppository 10 mg, 10 mg, Rectal, Daily, Zimmerman, [name], PA-C .  ceFAZolin  (ANCEF ) IVPB 2g/100 mL premix, 2 g, Intravenous, Q8H, Zimmerman, [name], PA-C, Last Rate: 200 mL/hr at 11/08/23 2222, 2 g at 11/08/23 2222 .  clevidipine (CLEVIPREX) infusion 0.5 mg/mL, 0-21 mg/hr, Intravenous, Continuous, Autry, Lauren E, PA-C, Last Rate: 8 mL/hr at 11/08/23 2225, 4 mg/hr at 11/08/23 2225 .  dexmedetomidine (PRECEDEX) 400 MCG/100ML (4 mcg/mL) infusion, 0-0.7 mcg/kg/hr, Intravenous, Continuous, Zimmerman, [name], PA-C, Stopped at 11/08/23 2132 .  dextrose  50 % solution 0-50 mL, 0-50 mL, Intravenous, PRN, Zimmerman, [name], PA-C .  [START ON 11/09/2023] docusate sodium  (COLACE) capsule 200 mg, 200 mg, Oral, Daily, Zimmerman, [name], PA-C .  EPINEPHrine  (ADRENALIN ) 5 mg in NS 250 mL (0.02 mg/mL) premix infusion,  0-10 mcg/min, Intravenous, Titrated, Zimmerman, [name], PA-C, Paused at 11/08/23 1831 .  feeding supplement (ENSURE PLUS HIGH PROTEIN) liquid 237 mL, 237 mL, Oral, BID BM, Zimmerman, [name], PA-C, 237  mL at 11/07/23 0934 .  insulin  regular, human (MYXREDLIN) 100 units/ 100 mL infusion, , Intravenous, Continuous, Zimmerman, [name], PA-C, Last Rate: 1.4 mL/hr at 11/08/23 2200, 1.4 Units/hr at 11/08/23 2200 .  ipratropium-albuterol  (DUONEB) 0.5-2.5 (3) MG/3ML nebulizer solution 3 mL, 3 mL, Nebulization, Q4H PRN, Zimmerman, [name], PA-C .  lactated ringers  infusion, , Intravenous, Continuous, Zimmerman, [name], PA-C, Last Rate: 20 mL/hr at 11/08/23 2200, Infusion Verify at 11/08/23 2200 .  lactated ringers  infusion, , Intravenous, Continuous, Zimmerman, [name], PA-C, Last Rate: 20 mL/hr at 11/08/23 1817, Rate Change at 11/08/23 1817 .  melatonin tablet 3 mg, 3 mg, Oral, QHS, Zimmerman, [name], PA-C, 3 mg at 11/07/23 2148 .  metoCLOPramide (REGLAN) injection 10 mg, 10 mg, Intravenous, Q6H, Zimmerman, [name], PA-C, 10 mg at 11/08/23 2341 .  metoprolol  tartrate (LOPRESSOR ) tablet 12.5 mg, 12.5 mg, Oral, BID **OR** metoprolol  tartrate (LOPRESSOR ) 25 mg/10 mL oral suspension 12.5 mg, 12.5 mg, Per Tube, BID, Zimmerman, [name], PA-C, 12.5 mg at 11/08/23 2048 .  metoprolol  tartrate (LOPRESSOR ) injection 2.5-5 mg, 2.5-5 mg, Intravenous, Q2H PRN, Zimmerman, [name], PA-C .  midazolam  (VERSED ) injection 2 mg, 2 mg, Intravenous, Q1H PRN, Zimmerman, [name], PA-C .  morphine (PF) 2 MG/ML injection 1-4 mg, 1-4 mg, Intravenous, Q1H PRN, Zimmerman, [name], PA-C .  nitroGLYCERIN  50 mg in dextrose  5 % 250 mL (0.2 mg/mL) infusion, 0-100 mcg/min, Intravenous, Titrated, Zimmerman, [name], PA-C .  norepinephrine (LEVOPHED) 4mg  in (0.016 mg/mL) premix infusion, 0-10 mcg/min, Intravenous, Titrated, Zimmerman, [name], PA-C, Paused at 11/08/23 8166 .  ondansetron  (ZOFRAN )  injection 4 mg, 4 mg, Intravenous, Q6H PRN, Zimmerman, [name], PA-C .  oxyCODONE  (Oxy IR/ROXICODONE ) immediate release tablet 5-10 mg, 5-10 mg, Oral, Q3H PRN, Zimmerman, [name], PA-C .  [START ON 11/10/2023] pantoprazole  (PROTONIX ) EC tablet 40 mg, 40 mg, Oral, Daily, Zimmerman, [name], PA-C .  pantoprazole  (PROTONIX ) injection 40 mg, 40 mg, Intravenous, QHS, Zimmerman, [name], PA-C, 40 mg at 11/08/23 2222 .  phenylephrine  (NEO-SYNEPHRINE) 20mg /NS 250mL premix infusion, 0-400 mcg/min, Intravenous, Titrated, Zimmerman, [name], PA-C .  polyethylene glycol (MIRALAX  / GLYCOLAX ) packet 17 g, 17 g, Oral, Daily PRN, Adolph, Lauren E, PA-C .  rOPINIRole  (REQUIP ) tablet 0.25 mg, 0.25 mg, Oral, TID, Zimmerman, [name], PA-C, 0.25 mg at 11/07/23 2148 .  rosuvastatin  (CRESTOR ) tablet 40 mg, 40 mg, Oral, Daily, Zimmerman, [name], PA-C, 40 mg at 11/07/23 0840 .  [START ON 11/09/2023] sodium chloride  flush (NS) 0.9 % injection 3 mL, 3 mL, Intravenous, Q12H, Zimmerman, [name], PA-C .  [START ON 11/09/2023] sodium chloride  flush (NS) 0.9 % injection 3 mL, 3 mL, Intravenous, PRN, Zimmerman, [name], PA-C .  SUMAtriptan  (IMITREX ) tablet 50 mg, 50 mg, Oral, Daily PRN, Zimmerman, [name], PA-C, 50 mg at 11/07/23 1447 .  traMADol (ULTRAM) tablet 50-100 mg, 50-100 mg, Oral, Q4H PRN, Zimmerman, [name], PA-C .  vancomycin (VANCOCIN) IVPB 1000 mg/200 mL premix, 1,000 mg, Intravenous, Once, Dwan Aldo M, PA-C, Last Rate: 200 mL/hr at 11/08/23 2340, 1,000 mg at 11/08/23 2340  Vitals   Vitals:   11/08/23 2207 11/08/23 2208 11/08/23 2209 11/08/23 2210  BP:      Pulse: 80 80 80 80  Resp: 15 17 (!) 25 (!) 24  Temp: (!) 97 F (36.1 C) (!) 97 F (36.1 C) (!) 97 F (36.1 C) (!) 97 F (36.1 C)  TempSrc:      SpO2: 99%  99% 98% 96%  Weight:      Height:        Body mass index is 19.09 kg/m.   Physical Exam   Physical Exam   General: Laying comfortably in bed; in no  acute distress. *** HENT: Normal oropharynx and mucosa. Normal external appearance of ears and nose. *** Neck: Supple, no pain or tenderness *** CV: No JVD. No peripheral edema. *** Pulmonary: Symmetric Chest rise. Normal respiratory effort. *** Abdomen: Soft to touch, non-tender. *** Ext: No cyanosis, edema, or deformity *** Skin: No rash. Normal palpation of skin.  *** Musculoskeletal: Normal digits and nails by inspection. No clubbing. ***  Neurologic Examination  Mental status/Cognition: Alert, oriented to self, place, month and year, good attention. *** Speech/language: Fluent, comprehension intact, object naming intact, repetition intact. *** Cranial nerves:   CN II Pupils equal and reactive to light, no VF deficits ***   CN III,IV,VI EOM intact, no gaze preference or deviation, no nystagmus ***   CN V normal sensation in V1, V2, and V3 segments bilaterally ***   CN VII no asymmetry, no nasolabial fold flattening ***   CN VIII normal hearing to speech ***   CN IX & X normal palatal elevation, no uvular deviation ***   CN XI 5/5 head turn and 5/5 shoulder shrug bilaterally ***   CN XII midline tongue protrusion ***   Motor:  Muscle bulk: ***, tone ***, pronator drift *** tremor *** Mvmt Root Nerve  Muscle Right Left Comments  SA C5/6 Ax Deltoid     EF C5/6 Mc Biceps     EE C6/7/8 Rad Triceps     WF C6/7 Med FCR     WE C7/8 PIN ECU     F Ab C8/T1 U ADM/FDI     HF L1/2/3 Fem Illopsoas     KE L2/3/4 Fem Quad     DF L4/5 D Peron Tib Ant     PF S1/2 Tibial Grc/Sol      Reflexes:  Right Left Comments  Pectoralis      Biceps (C5/6)     Brachioradialis (C5/6)      Triceps (C6/7)      Patellar (L3/4)      Achilles (S1)      Hoffman      Plantar     Jaw jerk    Sensation:  Light touch    Pin prick    Temperature    Vibration   Proprioception    Coordination/Complex Motor:  - Finger to Nose *** - Heel to shin *** - Rapid alternating movement *** - Gait: Stride  length ***. Arm swing ***. Base width ***  Labs/Imaging/Neurodiagnostic studies   CBC:  Recent Labs  Lab Nov 13, 2023 0515 11/03/23 0721 11/08/23 0606 11/08/23 1145 11/08/23 1515 11/08/23 1544 11/08/23 1646 11/08/23 1838  WBC 7.9   < > 6.6  --   --   --   --  8.8  NEUTROABS 5.3  --   --   --   --   --   --   --   HGB 12.2*   < > 10.5*   < > 7.6*   < > 6.8* 9.9*  HCT 36.2*   < > 30.8*   < > 22.3*   < > 20.0* 29.2*  MCV 91.4   < > 91.7  --   --   --   --  87.4  PLT 145*   < > 129*  --  68*  --   --  91*   < > = values in this interval not displayed.   Basic Metabolic Panel:  Lab Results  Component Value Date   NA 142 11/08/2023   K 4.3 11/08/2023   CO2 19 (L) 11/08/2023   GLUCOSE 151 (H) 11/08/2023   BUN 24 (H) 11/08/2023   CREATININE 1.70 (H) 11/08/2023   CALCIUM  8.6 (L) 11/08/2023   GFRNONAA 28 (L) 11/08/2023   GFRAA >60 07/20/2019   Lipid Panel:  Lab Results  Component Value Date   LDLCALC 36 08/14/2023   HgbA1c:  Lab Results  Component Value Date   HGBA1C 8.7 (H) 11/02/2023   Urine Drug Screen:     Component Value Date/Time   COCAINSCRNUR Negative 07/21/2023 1228    Alcohol Level No results found for: [facility] INR  Lab Results  Component Value Date   INR 1.6 (H) 11/08/2023   APTT  Lab Results  Component Value Date   APTT 41 (H) 11/08/2023   AED levels: No results found for: PHENYTOIN, ZONISAMIDE, LAMOTRIGINE, LEVETIRACETA  CT Head without contrast(Personally reviewed): ***  CT angio Head and Neck with contrast(Personally reviewed): ***  MR Angio head without contrast and Carotid Duplex BL(Personally reviewed): ***  MRI Brain(Personally reviewed): ***  Neurodiagnostics rEEG:  ***  ASSESSMENT   Michael Bender is a 78 y.o. male ***  RECOMMENDATIONS  *** ______________________________________________________________________    Signed, Rohit Deloria, MD [facility]

## 2023-11-08 NOTE — Anesthesia Procedure Notes (Addendum)
 Arterial Line Insertion Start/End10/09/2023 10:50 AM, 11/08/2023 11:00 AM Performed by: Len Debby HERO, RN, CRNA  Patient location: Pre-op. Preanesthetic checklist: patient identified, IV checked, site marked, risks and benefits discussed, surgical consent, monitors and equipment checked, pre-op evaluation, timeout performed and anesthesia consent Lidocaine  1% used for infiltration and patient sedated Left, radial was placed Catheter size: 18 G Hand hygiene performed  and maximum sterile barriers used  Allen's test indicative of satisfactory collateral circulation Attempts: 2 Procedure performed using ultrasound to evaluate access site. Ultrasound Notes:relevant anatomy identified, ultrasound used to visualize needle entry, vessel patent under ultrasound and image(s) printed for medical record. Following insertion, dressing applied and Biopatch. Post procedure assessment: normal and unchanged  Post procedure complications: unsuccessful attempts (First attempt made using palpation and landmarks, second attempt made with ultrasound).

## 2023-11-08 NOTE — Consult Note (Signed)
 NEUROLOGY CONSULT NOTE   Date of service: November 08, 2023 Patient Name: Michael Bender MRN:  [id number] DOB:  02-29-1945 Chief Complaint: Code stroke for seizure Requesting Provider: [name], MD  History of Present Illness  Michael Bender is a 78 y.o. male with hx of AKI on CKD, prior MSSA endocarditis complicated by severe aortic insufficiency, pAfibb, DM2, HTN, HLD who is admitted with decompensated HF and underwent aortic valve replacement today. Extubated post procedure and transferred to CVICU.  A code stroke was activated for noted new onset seizure with Left arm and L leg and L face twitching with L gaze. Had a seizure lasting 1 mins, followed shortly by a second seizure. Depite the seizure beign on the left side, patient is moving his L arm spontaneously with no movement in RUE. HE is post ictal and significantly drowsy.  He was given 1mg  of Ativan and loaded with Keppra 60mg /KG.  LKW: 2300 Modified rankin score: unclear. IV Thrombolysis: not offered, major surgery and platelet count today is less than 100k despite transfusion. EVT: not offered, no LVO.  NIHSS components Score: Comment  1a Level of Conscious 0[]  1[x]  2[]  3[]      1b LOC Questions 0[]  1[]  2[x]       1c LOC Commands 0[]  1[x]  2[]       2 Best Gaze 0[x]  1[]  2[]       3 Visual 0[x]  1[]  2[]  3[]      4 Facial Palsy 0[]  1[x]  2[]  3[]      5a Motor Arm - left 0[]  1[x]  2[]  3[]  4[]  UN[]    5b Motor Arm - Right 0[]  1[]  2[]  3[]  4[x]  UN[]    6a Motor Leg - Left 0[]  1[]  2[]  3[x]  4[]  UN[]    6b Motor Leg - Right 0[]  1[]  2[]  3[x]  4[]  UN[]    7 Limb Ataxia 0[x]  1[]  2[]  UN[]      8 Sensory 0[]  1[]  2[x]  UN[]      9 Best Language 0[]  1[]  2[x]  3[]      10 Dysarthria 0[]  1[]  2[x]  UN[]      11 Extinct. and Inattention 0[]  1[x]  2[]       TOTAL:       ROS  Unable to ascertain due to limited speech, post ictal after seizure.  Past History   Past Medical History:  Diagnosis Date   Acute combined systolic and diastolic heart  failure (HCC) 96/77/7974   Acute on chronic diastolic CHF (congestive heart failure) (HCC) 04/20/2023   AKI (acute kidney injury) 10/28/2022   Arthritis    Back   Balance problems 02/21/2023   Blood transfusion    as a child   Cachexia 05/03/2022   Added based on temporal wasting noted on exam, Body mass index is 19.92 kg/m. At 05/03/2022 encounter     Cervical discitis 11/10/2022   Chronic back pain    COPD (chronic obstructive pulmonary disease) (HCC)    Disturbance of skin sensation 05/22/2018   Effusion of right knee 02/21/2023   Effusion of right knee joint 01/16/2023   Elevated troponin 04/22/2023   Essential hypertension 03/05/2007   Qualifier: Diagnosis of   By: Krystal MD, Reyes LABOR      Finger pain, left 11/08/2022   Gastric erosion    Gastritis and gastroduodenitis    Gastrointestinal hemorrhage with melena 11/20/2018   GIB (gastrointestinal bleeding) 07/18/2019   Hardware complicating wound infection 11/10/2022   Headache(784.0)    last one 6 months ago   Hemorrhoids, internal    High risk medication  use 03/03/2022   History of duodenal ulcer    History of fusion of cervical spine 03/03/2022   With persistent cervical pain and palpable screws  Led to disability  History of attempt to dig furrow in skull to fix it  Last surgery 1992     History of lumbar fusion 03/03/2022   RE-OPERATIVE DIAGNOSIS:  lumbar stenosis synovial cyst lumbar spondylosis spondylolisthesis lumbar radiculoapthy L4/5   PROCEDURE:  Procedure(s): POSTERIOR LUMBAR FUSION 1 LEVEL with resection of synovial cyst     History of upper gastrointestinal bleeding 03/03/2022   Duodenal ulcer 2021 Dr. Avram   HLD (hyperlipidemia)    Hypertension    Hypokalemia 04/22/2023   Hypomagnesemia 04/22/2023   Infected blister of left index finger 11/07/2022   Intractable pain 03/03/2022   Loss of weight    Wt Readings from Last 10 Encounters:  04/05/22  146 lb (66.2 kg)  03/03/22  143 lb 9.6 oz (65.1 kg)   07/14/21  153 lb (69.4 kg)  12/14/20  147 lb 12.8 oz (67 kg)  11/13/19  154 lb (69.9 kg)  09/16/19  146 lb (66.2 kg)  08/06/19  146 lb (66.2 kg)  07/19/19  144 lb 13.5 oz (65.7 kg)  07/17/19  148 lb (67.1 kg)  07/09/19  147 lb 9.6 oz (67 kg)         MSSA bacteremia 11/07/2022   Neck rigidity    post cervical fusion   Nocturia    PAIN, CHRONIC NEC 10/06/2006   Qualifier: Diagnosis of   By: Krystal RN, Leeroy       Pneumonia    Prostate disease    Right sided temporal headache 05/22/2018   S/P cervical spinal fusion 03/03/2022   With persistent cervical pain and palpable screws Led to disability History of attempt to dig furrow in skull to fix it Last surgery 1992   Senile ecchymosis 01/21/2019   Septic arthritis of wrist, left (HCC) 11/08/2022   Septic infrapatellar bursitis of right knee 11/07/2022   Staphylococcal Arthritis of Right Knee (Updated 01/30/2023) MRI 01/17/2023 shows worsening findings:  Worsening tear of posterior horn medial meniscus with large radial component Worsening subcortical stress fracture/osteochondral lesion of medial femoral condyle New subcortical stress fracture/osteochondral lesion of medial tibial plateau Moderate-to-large effusion with worsened synovitis    Staphylococcal arthritis of left wrist (HCC) 11/07/2022   Staphylococcal arthritis of right knee (HCC) 11/08/2022   Syncope 11/05/2022   Underweight on examination 05/03/2022    Past Surgical History:  Procedure Laterality Date   BIOPSY  07/19/2019   Procedure: BIOPSY;  Surgeon: San Sandor GAILS, DO;  Location: [facility];  Service: Gastroenterology;;   CERVICAL FUSION  1992   C2/C 3  four surgeries   COLONOSCOPY     ESOPHAGOGASTRODUODENOSCOPY  07/20/2011   Procedure: ESOPHAGOGASTRODUODENOSCOPY (EGD);  Surgeon: Lupita FORBES Avram, MD;  Location: [facility];  Service: Endoscopy;  Laterality: N/A;   ESOPHAGOGASTRODUODENOSCOPY (EGD) WITH PROPOFOL  N/A 07/19/2019   Procedure:  ESOPHAGOGASTRODUODENOSCOPY (EGD) WITH PROPOFOL ;  Surgeon: San Sandor GAILS, DO;  Location: [facility];  Service: Gastroenterology;  Laterality: N/A;   I & D EXTREMITY Left 11/09/2022   Procedure: IRRIGATION AND DEBRIDEMENT LEFT WRIST;  Surgeon: Arlinda Buster, MD;  Location: [facility];  Service: Orthopedics;  Laterality: Left;   RIGHT/LEFT HEART CATH AND CORONARY ANGIOGRAPHY N/A 11/06/2023   Procedure: RIGHT/LEFT HEART CATH AND CORONARY ANGIOGRAPHY;  Surgeon: Wonda Sharper, MD;  Location: [facility] INVASIVE CV LAB;  Service: Cardiovascular;  Laterality: N/A;  SAVORY DILATION  07/20/2011   Procedure: SAVORY DILATION;  Surgeon: Lupita FORBES Commander, MD;  Location: [facility];  Service: Endoscopy;  Laterality: N/A;   SPINAL FUSION  05/06/11   TRANSESOPHAGEAL ECHOCARDIOGRAM (CATH LAB) N/A 07/24/2023   Procedure: TRANSESOPHAGEAL ECHOCARDIOGRAM;  Surgeon: Pietro Redell RAMAN, MD;  Location: [facility] INVASIVE CV LAB;  Service: Cardiovascular;  Laterality: N/A;    Family History: Family History  Problem Relation Age of Onset   Heart disease Mother    Heart attack Mother 56   Pancreatic cancer Father 82   Pancreatic cancer Brother    Anesthesia problems Neg Hx    Colon cancer Neg Hx    Liver cancer Neg Hx    Stomach cancer Neg Hx    Esophageal cancer Neg Hx    Rectal cancer Neg Hx     Social History  reports that he quit smoking about 4 years ago. His smoking use included cigarettes. He started smoking about 44 years ago. He has never used smokeless tobacco. He reports that he does not currently use alcohol after a past usage of about 2.0 standard drinks of alcohol per week. He reports that he does not use drugs.  Allergies  Allergen Reactions   Nubain [Nalbuphine Hcl]     Muscle contraction    Medications   Current Facility-Administered Medications:    0.45 % sodium chloride  infusion, , Intravenous, Continuous PRN, Zimmerman, [name], PA-C, Paused at 11/08/23 1930   [START ON 11/09/2023] 0.9 %  sodium  chloride infusion, 250 mL, Intravenous, Continuous, Zimmerman, [name], PA-C   0.9 %  sodium chloride  infusion, , Intravenous, Continuous, Zimmerman, [name], PA-C   [START ON 11/09/2023] acetaminophen  (TYLENOL ) tablet 1,000 mg, 1,000 mg, Oral, Q6H **OR** [START ON 11/09/2023] acetaminophen  (TYLENOL ) 160 MG/5ML solution 1,000 mg, 1,000 mg, Per Tube, Q6H, Zimmerman, [name], PA-C   albumin human 5 % solution 12.5 g, 250 mL, Intravenous, Q15 min PRN, Zimmerman, [name], PA-C, Stopped at 11/08/23 2046   amitriptyline  (ELAVIL ) tablet 50 mg, 50 mg, Oral, QHS, Zimmerman, [name], PA-C, 50 mg at 11/07/23 2148   artificial tears ophthalmic solution 1 drop, 1 drop, Both Eyes, PRN, Zimmerman, [name], PA-C, 1 drop at 11/05/23 2123   [START ON 11/09/2023] aspirin EC tablet 325 mg, 325 mg, Oral, Daily **OR** [START ON 11/09/2023] aspirin chewable tablet 324 mg, 324 mg, Per Tube, Daily, Zimmerman, [name], PA-C   [START ON 11/09/2023] bisacodyl  (DULCOLAX) EC tablet 10 mg, 10 mg, Oral, Daily **OR** [START ON 11/09/2023] bisacodyl  (DULCOLAX) suppository 10 mg, 10 mg, Rectal, Daily, Zimmerman, [name], PA-C   ceFAZolin  (ANCEF ) IVPB 2g/100 mL premix, 2 g, Intravenous, Q8H, Zimmerman, [name], PA-C, Last Rate: 200 mL/hr at 11/08/23 2222, 2 g at 11/08/23 2222   clevidipine (CLEVIPREX) infusion 0.5 mg/mL, 0-21 mg/hr, Intravenous, Continuous, Autry, Lauren E, PA-C, Last Rate: 8 mL/hr at 11/08/23 2225, 4 mg/hr at 11/08/23 2225   dexmedetomidine (PRECEDEX) 400 MCG/100ML (4 mcg/mL) infusion, 0-0.7 mcg/kg/hr, Intravenous, Continuous, Zimmerman, [name], PA-C, Stopped at 11/08/23 2132   dextrose  50 % solution 0-50 mL, 0-50 mL, Intravenous, PRN, Zimmerman, [name], PA-C   [START ON 11/09/2023] docusate sodium  (COLACE) capsule 200 mg, 200 mg, Oral, Daily, Zimmerman, [name], PA-C   EPINEPHrine  (ADRENALIN ) 5 mg in NS 250 mL (0.02 mg/mL) premix infusion, 0-10 mcg/min, Intravenous, Titrated, Zimmerman,  [name], PA-C, Paused at 11/08/23 1831   feeding supplement (ENSURE PLUS HIGH PROTEIN) liquid 237 mL, 237 mL, Oral, BID  BM, Dwan Aldo M, PA-C, 237 mL at 11/07/23 9065   insulin  regular, human (MYXREDLIN) 100 units/ 100 mL infusion, , Intravenous, Continuous, Zimmerman, [name], PA-C, Last Rate: 1.4 mL/hr at 11/08/23 2200, 1.4 Units/hr at 11/08/23 2200   ipratropium-albuterol  (DUONEB) 0.5-2.5 (3) MG/3ML nebulizer solution 3 mL, 3 mL, Nebulization, Q4H PRN, Zimmerman, [name], PA-C   lactated ringers  infusion, , Intravenous, Continuous, Zimmerman, [name], PA-C, Last Rate: 20 mL/hr at 11/08/23 2200, Infusion Verify at 11/08/23 2200   lactated ringers  infusion, , Intravenous, Continuous, Zimmerman, [name], PA-C, Last Rate: 20 mL/hr at 11/08/23 1817, Rate Change at 11/08/23 1817   melatonin tablet 3 mg, 3 mg, Oral, QHS, Zimmerman, [name], PA-C, 3 mg at 11/07/23 2148   metoCLOPramide (REGLAN) injection 10 mg, 10 mg, Intravenous, Q6H, Zimmerman, [name], PA-C, 10 mg at 11/08/23 2341   metoprolol  tartrate (LOPRESSOR ) tablet 12.5 mg, 12.5 mg, Oral, BID **OR** metoprolol  tartrate (LOPRESSOR ) 25 mg/10 mL oral suspension 12.5 mg, 12.5 mg, Per Tube, BID, Zimmerman, [name], PA-C, 12.5 mg at 11/08/23 2048   metoprolol  tartrate (LOPRESSOR ) injection 2.5-5 mg, 2.5-5 mg, Intravenous, Q2H PRN, Zimmerman, [name], PA-C   midazolam  (VERSED ) injection 2 mg, 2 mg, Intravenous, Q1H PRN, Zimmerman, [name], PA-C   morphine (PF) 2 MG/ML injection 1-4 mg, 1-4 mg, Intravenous, Q1H PRN, Zimmerman, [name], PA-C   nitroGLYCERIN  50 mg in dextrose  5 % 250 mL (0.2 mg/mL) infusion, 0-100 mcg/min, Intravenous, Titrated, Zimmerman, [name], PA-C   norepinephrine (LEVOPHED) 4mg  in (0.016 mg/mL) premix infusion, 0-10 mcg/min, Intravenous, Titrated, Zimmerman, [name], PA-C, Paused at 11/08/23 1833   ondansetron  (ZOFRAN ) injection 4 mg, 4 mg, Intravenous, Q6H PRN, Zimmerman,  [name], PA-C   oxyCODONE  (Oxy IR/ROXICODONE ) immediate release tablet 5-10 mg, 5-10 mg, Oral, Q3H PRN, Zimmerman, [name], PA-C   [START ON 11/10/2023] pantoprazole  (PROTONIX ) EC tablet 40 mg, 40 mg, Oral, Daily, Zimmerman, [name], PA-C   pantoprazole  (PROTONIX ) injection 40 mg, 40 mg, Intravenous, QHS, Zimmerman, [name], PA-C, 40 mg at 11/08/23 2222   phenylephrine  (NEO-SYNEPHRINE) 20mg /NS 250mL premix infusion, 0-400 mcg/min, Intravenous, Titrated, Zimmerman, [name], PA-C   polyethylene glycol (MIRALAX  / GLYCOLAX ) packet 17 g, 17 g, Oral, Daily PRN, Adolph, Lauren E, PA-C   rOPINIRole  (REQUIP ) tablet 0.25 mg, 0.25 mg, Oral, TID, Zimmerman, [name], PA-C, 0.25 mg at 11/07/23 2148   rosuvastatin  (CRESTOR ) tablet 40 mg, 40 mg, Oral, Daily, Zimmerman, [name], PA-C, 40 mg at 11/07/23 0840   [START ON 11/09/2023] sodium chloride  flush (NS) 0.9 % injection 3 mL, 3 mL, Intravenous, Q12H, Zimmerman, [name], PA-C   [START ON 11/09/2023] sodium chloride  flush (NS) 0.9 % injection 3 mL, 3 mL, Intravenous, PRN, Zimmerman, [name], PA-C   SUMAtriptan  (IMITREX ) tablet 50 mg, 50 mg, Oral, Daily PRN, Zimmerman, [name], PA-C, 50 mg at 11/07/23 1447   traMADol (ULTRAM) tablet 50-100 mg, 50-100 mg, Oral, Q4H PRN, Zimmerman, [name], PA-C   vancomycin (VANCOCIN) IVPB 1000 mg/200 mL premix, 1,000 mg, Intravenous, Once, Dwan Aldo M, PA-C, Last Rate: 200 mL/hr at 11/08/23 2340, 1,000 mg at 11/08/23 2340  Vitals   Vitals:   11/08/23 2207 11/08/23 2208 11/08/23 2209 11/08/23 2210  BP:      Pulse: 80 80 80 80  Resp: 15 17 (!) 25 (!) 24  Temp: (!) 97 F (36.1 C) (!) 97 F (36.1 C) (!) 97 F (36.1 C) (!) 97 F (36.1 C)  TempSrc:  SpO2: 99% 99% 98% 96%  Weight:      Height:        Body mass index is 19.09 kg/m.   Physical Exam   General: Laying comfortably in bed; in no acute distress.  HENT: Normal oropharynx and mucosa. Normal external appearance of ears  and nose.  Neck: Supple, no pain or tenderness  CV: No JVD. No peripheral edema.  Pulmonary: Symmetric Chest rise. Normal respiratory effort.  Abdomen: Soft to touch, non-tender.  Ext: No cyanosis, edema, or deformity  Skin: No rash. Normal palpation of skin.   Musculoskeletal: Normal digits and nails by inspection. No clubbing.   Neurologic Examination  Mental status/Cognition: eyes closed, opens eyes to loud voice, makes brief eye contact. Oriented to self only. Speech/language: severely dysarthric speech, non fluent, does not identify objects. With a lot of encouragement/coaxing he will follow some simple commands. Cranial nerves:   CN II Pupils equal and reactive to light, blinks to threat BL   CN III,IV,VI Makes eye contact on left and right.   CN V Difficult to assess.   CN VII R facial droop   CN VIII Makes eye contact to speech   CN IX & X Protecting his airway.   CN XI Head slightly turned to left.   CN XII Does not protrude tongue on command.   Sensory/Motor:  Muscle bulk: poor, tone flaccid in RUE Spontaneous and antigravity movements in LUE. No spontaneous movement noted in BL lower extremities. No response to proximal pinch in BL lower extremities.  Coordination/Complex Motor:  Intact in LUE.  Labs/Imaging/Neurodiagnostic studies   CBC:  Recent Labs  Lab 06-Nov-2023 0515 11/03/23 0721 11/08/23 0606 11/08/23 1145 11/08/23 1515 11/08/23 1544 11/08/23 1646 11/08/23 1838  WBC 7.9   < > 6.6  --   --   --   --  8.8  NEUTROABS 5.3  --   --   --   --   --   --   --   HGB 12.2*   < > 10.5*   < > 7.6*   < > 6.8* 9.9*  HCT 36.2*   < > 30.8*   < > 22.3*   < > 20.0* 29.2*  MCV 91.4   < > 91.7  --   --   --   --  87.4  PLT 145*   < > 129*  --  68*  --   --  91*   < > = values in this interval not displayed.   Basic Metabolic Panel:  Lab Results  Component Value Date   NA 142 11/08/2023   K 4.3 11/08/2023   CO2 19 (L) 11/08/2023   GLUCOSE 151 (H) 11/08/2023   BUN  24 (H) 11/08/2023   CREATININE 1.70 (H) 11/08/2023   CALCIUM  8.6 (L) 11/08/2023   GFRNONAA 28 (L) 11/08/2023   GFRAA >60 07/20/2019   Lipid Panel:  Lab Results  Component Value Date   LDLCALC 36 08/14/2023   HgbA1c:  Lab Results  Component Value Date   HGBA1C 8.7 (H) 11/06/23   Urine Drug Screen:     Component Value Date/Time   COCAINSCRNUR Negative 07/21/2023 1228    Alcohol Level No results found for: [facility] INR  Lab Results  Component Value Date   INR 1.6 (H) 11/08/2023   APTT  Lab Results  Component Value Date   APTT 41 (H) 11/08/2023   AED levels: No results found for: PHENYTOIN, ZONISAMIDE, LAMOTRIGINE, LEVETIRACETA  CT  Head without contrast(Personally reviewed): CTH was negative for a large hypodensity concerning for a large territory infarct or hyperdensity concerning for an ICH  CT angio Head and Neck with contrast(Personally reviewed): No LVO  MRI Brain(Personally reviewed): pending  Neurodiagnostics cEEG:  pending  ASSESSMENT   Michael Bender is a 78 y.o. male with hx of AKI on CKD, prior MSSA endocarditis complicated by severe aortic insufficiency, pAfibb, DM2, HTN, HLD who is admitted with decompensated HF and underwent aortic valve replacement today. Extubated post procedure and transferred to CVICU.  A code stroke was activated for noted new onset seizure with Left arm and L leg and L face twitching with L gaze. Had a seizure lasting 1 mins, followed shortly by a second seizure. Depite the seizure beign on the left side, patient is moving his L arm spontaneously with no movement in RUE. HE is post ictal and significantly drowsy.  No prior documented hx of seizure. Etiology of seizure clustering is unclear. Perhaps secondary to prior strokes in the setting of endocarditis but unclear why he continues to have them despite loading with status dose Keppra.  He is becoming more drowsy and seizure s/p Keppra load seem clinically subtle. Will  put him up on LTM EEG. Can't use VPA due to thrombocytopenia, can't use Vimpat due to prolonged PR interval on most recent ECG.  RECOMMENDATIONS  - Keppra 60mg /Kg IV once, followed by Keppra 1000mg  BID - LTM EEG - Ativan 1-2mg  PRN for seizure. - loaded with fosphenytoin 20mg /Kg - further AEDs based on LTM EEG - MRI brain with and without contrast after LTM EEG. ______________________________________________________________________  This patient is critically ill and at significant risk of neurological worsening, death and care requires constant monitoring of vital signs, hemodynamics,respiratory and cardiac monitoring, neurological assessment, discussion with family, other specialists and medical decision making of high complexity. I spent 60 minutes of neurocritical care time  in the care of  this patient. This was time spent independent of any time provided by nurse practitioner or PA.  Bridgit Eynon [facility] 11/09/2023  2:06 AM   Signed, Oley Lahaie, MD [facility]

## 2023-11-08 NOTE — Op Note (Addendum)
 11/08/2023 Michael Bender [id number]  Surgeon:  Con Clunes, MD  First Assistant: Kyla Donald, PAC and Lemond Cera, [facility]   Preoperative Diagnosis:  Severe aortic stenosis, history of atrial fibrillation   Postoperative Diagnosis:  Same   Procedure:  Median Sternotomy Extracorporeal circulation 3.   Aortic valve replacement using a 27 mm Inspiris valve 4.  Left atrial appendage ligation  Anesthesia:  General Endotracheal   Clinical History/Surgical Indication:  Michael Bender is a pleasant 78 year old man with history of AKI on CKD (Cre 3.07 today), pAF (although doesn't recall having a dx of Afib), poorly controlled DM (A1c 8.7%), HTN, HL and HFrEF who presented with shortness of breath for the last two weeks. He had MSSA bacteremia + septic arthritis in Oct 2024, then found to have severe AI on MRI in May 2025 and then TEE in June 2025. He was seen by Dr. Lucas in July, who recommended AVR, but at that time, the patient was asymptomatic so he declined surgery. He says for the last couple weeks his dyspnea has gotten much worse.   His TTE again demonstrated severe AI after he presented with acute decompensated HF.  He also has a remove history of atrial fibrillation.  Preparation:  The patient was seen in the preoperative holding area and the correct patient, correct operation were confirmed with the patient after reviewing the medical record and catheterization. The consent was signed by me. Preoperative antibiotics were given. A pulmonary arterial line and radial arterial line were placed by the anesthesia team. The patient was taken back to the operating room and positioned supine on the operating room table. After being placed under general endotracheal anesthesia by the anesthesia team a foley catheter was placed. The neck, chest, abdomen, and both legs were prepped with betadine soap and solution and draped in the usual sterile manner. A surgical time-out was taken and the  correct patient and operative procedure were confirmed with the nursing and anesthesia staff.   Pre-bypass TEE:   Complete TEE assessment was performed by Dr. Leonce. EF 40%, Severe AI, mild MR, normal RV function  Post-bypass TEE:   Normal functioning bioprosthetic aortic valve with no perivalvular leak or regurgitation through the valve. Mean gradient 3 mmHg.  Left and right ventricular function preserved. Mild mitral regurgitation.  Cardiopulmonary Bypass:  A median sternotomy was performed. The pericardium was opened in the midline. Right ventricular function appeared normal. The ascending aorta was slightly dilated and had several areas of plaque that were marked and avoided. There were no contraindications to aortic cannulation or cross-clamping. The patient was fully systemically heparinized and the ACT was maintained > 400 sec. The proximal aortic arch was cannulated with a 20 Fr aortic cannula for arterial inflow. Venous cannulation was performed via the right atrial appendage using a two-staged venous cannula. A left ventricular vent was placed via the right superior pulmonary vein. A retrograde cardioplegia cannnula was placed into the coronary sinus via the right atrium. Aortic occlusion was performed with a single cross-clamp. Systemic cooling to 32 degrees Centigrade and topical cooling of the heart with iced saline were used. Cardioplegia solution was used to induce diastolic arrest through the coronary sinus and then directly through the right and left coronary ostium after the aorta was opened.  Carbon dioxide was insufflated into the pericardium at 5L/min throughout the procedure to minimize intracardiac air.  Left atrial appendage ligation: The heart was lifting and the left atrium was measured to a 40 mm  Atriclip.  A 40 mm circumferential Atriclip was placed around the left atrial appendage.  There were no ST changes and the atrium appeared completely occluded on  TEE.   Aortic Valve Replacement:   A transverse aortotomy was performed 1 cm above the take-off of the right coronary artery. The native valve was tricuspid with redundant appearing leaflets and a large defect in the non-coronary cusp. The ostia of the coronary arteries were in normal position and were not obstructed. The native valve leaflets were excised and the annulus was decalcified with rongeurs. Care was taken to remove all particulate debris. The left ventricle was directly inspected for debris and then irrigated with ice saline solution. The annulus was sized and a size 27 mm Inspiris valve was chosen. The model number was [id number] and the serial number was [id number]. While the valve was being prepared 2-0 Ethibond pledgeted horizontal mattress sutures were placed around the annulus with the pledgets in a sub-annular position. The sutures were placed through the sewing ring and the valve lowered into place. The sutures were secured sequentially with Cor-Knot. The valve seated nicely and the coronary ostia were not obstructed. The prosthetic valve leaflets moved normally and there was no sub-valvular obstruction. The aortotomy was closed using 4-0 Prolene suture in 2 layers with felt strips to reinforce the closure.  Completion:  The patient was rewarmed to 37 degrees Centigrade. De-airing maneuvers were performed and the head placed in trendelenburg position. The crossclamp was removed with a time of 119 minutes. There was spontaneous return of sinus rhythm. The aortotomy was checked for hemostasis. Two temporary epicardial pacing wires were placed on the right atrium and a bipolar lead on the right ventricle. The left ventricular vent and retrograde cardioplegia cannulas were removed. The patient was weaned from CPB without difficulty on no inotropes. CPB time was 159 minutes. Heparin  was fully reversed with protamine and the aortic and venous cannulas removed. Hemostasis was achieved. Mediastinal  drainage tubes were placed. The sternum was closed with stainless steel wires. The fascia was closed with continuous # 1 vicryl suture. The skin was closed with 3-0 vicryl subcuticular suture. All sponge, needle, and instrument counts were reported correct at the end of the case. Dry sterile dressings were placed over the incisions and around the chest tubes which were connected to pleurevac suction. The patient was then transported to the surgical intensive care unit in critical but stable condition.  Addendum:  Experienced assistance was necessary for this case due to surgical complexity. Kyla Donald assisted with retraction of delicate tissues, exposure, suctioning, and suture management during the valve replacement.

## 2023-11-08 NOTE — Transfer of Care (Signed)
 Immediate Anesthesia Transfer of Care Note  Patient: Michael Bender  Procedure(s) Performed: AORTIC VALVE REPLACEMENT USING A INSPIRIS VALVE. (Chest) CLIPPING OF LEFT ATRIAL APPENDAGE USING A CLIP (Chest) ECHOCARDIOGRAM, TRANSESOPHAGEAL, INTRAOPERATIVE (Esophagus)  Patient Location: SICU  Anesthesia Type:General  Level of Consciousness: Patient remains intubated per anesthesia plan  Airway & Oxygen Therapy: Patient remains intubated per anesthesia plan and Patient placed on Ventilator (see vital sign flow sheet for setting)  Post-op Assessment: Report given to RN and Post -op Vital signs reviewed and stable  Post vital signs: Reviewed and stable  Last Vitals:  Vitals Value Taken Time  BP 104/67 11/08/23 17:58  Temp 36.1 C 11/08/23 18:03  Pulse 80 11/08/23 17:50  Resp 16 11/08/23 18:03  SpO2 100 11/08/23    1800  Vitals shown include unfiled device data.  Last Pain:  Vitals:   11/08/23 0944  TempSrc: Oral  PainSc:       Patients Stated Pain Goal: 0 (11/07/23 1447)  Complications: No notable events documented.

## 2023-11-08 NOTE — Progress Notes (Signed)
 Ensure given prior to surgery, per order, for cardiac recovery post op.

## 2023-11-08 NOTE — Progress Notes (Signed)
 No evidence of right radial pseudoaneurysm or AVF. Patent brachial artery.   Vascular to sign off

## 2023-11-08 NOTE — Discharge Instructions (Signed)
 Discharge Instructions:  1. You may shower, please wash incisions daily with soap and water  and keep dry.  If you wish to cover wounds with dressing you may do so but please keep clean and change daily.  No tub baths or swimming until incisions have completely healed.  If your incisions become red or develop any drainage please call our office at [phone]  2. No Driving until cleared by Dr. Linward office and you are no longer using narcotic pain medications  3. Monitor your weight daily.. Please use the same scale and weigh at same time... If you gain 5-10 lbs in 48 hours with associated lower extremity swelling, please contact our office at [phone]  4. Fever of 101.5 for at least 24 hours with no source, please contact our office at [phone]  5. Activity- up as tolerated, please walk at least 3 times per day.  Avoid strenuous activity, no lifting, pushing, or pulling with your arms over 8-10 lbs for a minimum of 6 weeks  6. If any questions or concerns arise, please do not hesitate to contact our office at [phone]

## 2023-11-08 NOTE — Progress Notes (Signed)
 Brief Progress Note  Doing well this morning. Unfortunately, had a vanilla ensure at 4:30 am so case delayed but he remains hemodynamically stable with improved work of breathing.  Vitals:   11/08/23 0814 11/08/23 0944  BP: (!) 134/55 (!) 124/57  Pulse: 81 81  Resp:  17  Temp: 98.4 F (36.9 C) 98.7 F (37.1 C)  SpO2: 99% 97%   Plan: OR today for AVR, LAAL.  High risk of kidney failure post-op, all other risks associated with surgery were discussed with patient and family as outlined in original consult note.  The pros and cons of a biological vs mechanical valve were discussed.  After joint discussion between the patient and myself, we have decided on implantation of a biologic valve.  Patient is in agreement to proceed with surgery.  Con Clunes, MD Cardiothoracic Surgery Pager: [phone]

## 2023-11-08 NOTE — Progress Notes (Signed)
 Upon receiving report from [place], [place], she informed that the pt drank a vanilla ensure that was ordered per cardiology. Explained to [facility] that the vanilla ensure was not the correct drink. Dr. Daniel called and made aware, and directed me to inform anesthesia. Dr. Leopoldo from anesthesia made aware and the surgery will be delayed until 1200. Dr. Daniel and Dayla, RN made aware to keep the pt N.P.O for the procedure.

## 2023-11-08 NOTE — Anesthesia Procedure Notes (Signed)
 Central Venous Catheter Insertion Performed by: Leonce Athens, MD, anesthesiologist Start/End10/09/2023 10:31 AM, 11/08/2023 10:47 AM Preanesthetic checklist: patient identified, IV checked, risks and benefits discussed, surgical consent, monitors and equipment checked, pre-op evaluation, timeout performed and anesthesia consent Position: Trendelenburg Lidocaine  1% used for infiltration and patient sedated Hand hygiene performed , maximum sterile barriers used  and Seldinger technique used Catheter size: 9 Fr PA cath was placed.MAC introducer Swan type:thermodilution Procedure performed using ultrasound to evaluate access site. Ultrasound Notes:relevant anatomy identified, ultrasound used to visualize needle entry, vessel patent under ultrasound and image(s) printed for medical record. Attempts: 1 Following insertion, line sutured, dressing applied and Biopatch. Post procedure assessment: blood return through all ports, free fluid flow and no air  Patient tolerated the procedure well with no immediate complications. Additional procedure comments: PA catheter:  Routine monitors. Timeout, sterile prep, drape, FBP R neck.  Trendelenburg position.  1% Lido local, finder and trocar RIJ 1st pass with US  guidance.  MAC introducer placed over J wire. PA catheter in easily.  Sterile dressing applied.  Patient tolerated well, VSS.  Michael Bender Leonce, MD.

## 2023-11-08 NOTE — Anesthesia Procedure Notes (Cosign Needed)
 Procedure Name: Intubation Date/Time: 11/08/2023 12:03 PM  Performed by: Leonce Athens, MDPre-anesthesia Checklist: Patient identified, Emergency Drugs available, Suction available and Patient being monitored Patient Re-evaluated:Patient Re-evaluated prior to induction Oxygen Delivery Method: Circle system utilized Preoxygenation: Pre-oxygenation with 100% oxygen Induction Type: IV induction Ventilation: Mask ventilation without difficulty Laryngoscope Size: Glidescope and 3 Grade View: Grade I Tube type: Oral Tube size: 8.0 mm Number of attempts: 1 Airway Equipment and Method: Stylet and Oral airway Placement Confirmation: ETT inserted through vocal cords under direct vision, positive ETCO2 and breath sounds checked- equal and bilateral Secured at: 23 cm Tube secured with: Tape Dental Injury: Teeth and Oropharynx as per pre-operative assessment  Comments: Intubated by NORVA Speed under MD/DO and CRNA supervision.

## 2023-11-08 NOTE — Progress Notes (Addendum)
 Progress Note   Patient: Michael Bender FMW:[id number] DOB: 03/07/1945 DOA: 11/01/2023  DOS: the patient was seen and examined on 11/08/2023   Brief hospital course:  78 y.o. 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   Assessment and Plan:   Severe aortic insufficiency/Hx MSSA endocarditis/dyspnea on exertion Severe AI due to previous history of endocarditis.  No evidence for current infection.  Not noted to be on any antibiotics currently.   Patient was seen by cardiothoracic surgery.  Plan is for aortic valve replacement later today.  Cardiac cath performed 10/6.    Acute RUE pain Initially suspected to be secondary to cath.  No hematoma, pain improving.  Evaluated by vascular surgery and cardiology.  No aneurysm or pseudoaneurysm noted on ultrasound.  Vascular surgery has seen the patient and has signed off.   Acute kidney injury on CKD 3b/normal anion gap metabolic acidosis Baseline renal function seems to be around 1.8.  Experience worsening renal function during this admission.  Creatinine peaked at 3.28.  Improvement noted.  Continue to monitor.   Avoid nephrotoxic agents.   Chronic HFrEF -Does not appear to be volume overloaded.  No edema appreciated.  No hypoxia.  Cardiology had been following closely.   Elevated troponin - Likely demand ischemia.  Troponins minimally elevated and flat.   Atrial fibrillation Stable.  Looks like patient has been in sinus rhythm.  Not on anticoagulation.  Defer to cardiology.   CAD/PAD/HLD - Aspirin and statin on board.   Hypertension - Amlodipine  on board.  Holding beta-blockers.   Diabetes mellitus -insulin  sliding scale on board.   Physical debilitation muscle weakness - Patient is very frail, limited by aortic insufficiency as  above.  Will hold off on PT eval secondary to CT surgery.   Subjective: Patient feels well this morning.  Looking forward to his surgery later today.  Physical Exam:  Vitals:   11/07/23 2010 11/08/23 0527 11/08/23 0814 11/08/23 0944  BP: (!) 126/55 (!) 131/57 (!) 134/55 (!) 124/57  Pulse: 89 89 81 81  Resp:  18  17  Temp: 98.5 F (36.9 C) 98.4 F (36.9 C) 98.4 F (36.9 C) 98.7 F (37.1 C)  TempSrc: Oral Oral  Oral  SpO2: 100% 97% 99% 97%  Weight:    62.1 kg  Height:    5' 11 (1.803 m)    General appearance: Awake alert.  In no distress Resp: Clear to auscultation bilaterally.  Normal effort Cardio: S1-S2 is normal regular.  No S3-S4.  No rubs murmurs or bruit GI: Abdomen is soft.  Nontender nondistended.  Bowel sounds are present normal.  No masses organomegaly Extremities: No edema.  Full range of motion of lower extremities. Neurologic: Alert and oriented x3.  No focal neurological deficits.    Data Reviewed:  Imaging Studies: VAS US  UPPER EXTREMITY ARTERIAL DUPLEX Result Date: 11/07/2023  UPPER EXTREMITY DUPLEX STUDY Patient Name:  AZREAL STTHOMAS  Date of Exam:   11/07/2023 Medical Rec #: [id number]         Accession #:    [id number] Date of Birth: Jun 18, 1945        Patient Gender: M Patient Age:   18 years Exam Location:  [facility] Procedure:      VAS US  UPPER EXTREMITY ARTERIAL DUPLEX Referring Phys: REDIA CLEAVER --------------------------------------------------------------------------------  Indications: Wrist pain, bruising, palpable knot and Pain  right antecubital              fossa area. History:     Patient has a history of catheterization via right radial artery.  Risk Factors: Hypertension, hyperlipidemia. Performing Technologist: Ricka Sturdivant-Jones RDMS, RVT  Examination Guidelines: A complete evaluation includes B-mode imaging, spectral Doppler, color Doppler, and power Doppler as needed of all accessible portions of each vessel. Bilateral testing  is considered an integral part of a complete examination. Limited examinations for reoccurring indications may be performed as noted.  Right Doppler Findings: +---------------+----------+---------+--------+--------+ Site           PSV (cm/s)Waveform StenosisComments +---------------+----------+---------+--------+--------+ Subclavian Dist          triphasic                 +---------------+----------+---------+--------+--------+ Brachial Prox  82        triphasic                 +---------------+----------+---------+--------+--------+ Brachial Dist  154       triphasic                 +---------------+----------+---------+--------+--------+ Radial Prox    138       triphasic                 +---------------+----------+---------+--------+--------+ Radial Mid     125       triphasic                 +---------------+----------+---------+--------+--------+ Radial Dist    108       triphasic                 +---------------+----------+---------+--------+--------+ Ulnar Dist     122       triphasic                 +---------------+----------+---------+--------+--------+    Summary:  Right: No evidence of right radial pseudoaneurysm or AVF. Patent        brachial artery. *See table(s) above for measurements and observations. Electronically signed by Debby Robertson on 11/07/2023 at 4:57:49 PM.    Final    CARDIAC CATHETERIZATION Result Date: 11/06/2023 1.  Patent coronary arteries with mild diffuse calcific plaquing but no significant stenoses 2.  Known severe aortic insufficiency with wide pulse pressure noted 3.  Right heart data: RA mean 3 mmHg RV 30/5 mmHg PA 30/14 mean 20 mmHg Pulmonary wedge pressure 13 mmHg (A-wave 12, V wave 15) LVEDP 9 mmHg Cardiac output/index by Fick measurement 6.1/3.4   VAS US  DOPPLER PRE CABG Result Date: 11/06/2023 PREOPERATIVE VASCULAR EVALUATION Patient Name:  OWIN VIGNOLA  Date of Exam:   11/04/2023 Medical Rec #: [id number]          Accession #:    [id number] Date of Birth: [birth year]        Patient Gender: M Patient Age:   74 years Exam Location:  [facility] Procedure:      VAS US  DOPPLER PRE CABG Referring Phys: BAILEY CHAMBERS --------------------------------------------------------------------------------  Indications:      Pre-CABG. Risk Factors:     Hypertension, hyperlipidemia. Comparison Study: No prior studies. Performing Technologist: Gerome Ny RVT  Examination Guidelines: A complete evaluation includes B-mode imaging, spectral Doppler, color Doppler, and power Doppler as needed of all accessible portions of each vessel. Bilateral testing is considered an integral part of a complete examination. Limited examinations for reoccurring indications may be performed as noted.  Right Carotid Findings: +----------+--------+-------+--------+--------------------------------+--------+  PSV cm/sEDV    StenosisDescribe                        Comments                   cm/s                                                    +----------+--------+-------+--------+--------------------------------+--------+ CCA Prox  151     18             smooth, heterogenous and                                                  calcific                                 +----------+--------+-------+--------+--------------------------------+--------+ CCA Distal90      12             smooth and heterogenous                  +----------+--------+-------+--------+--------------------------------+--------+ ICA Prox  85      17             irregular and heterogenous               +----------+--------+-------+--------+--------------------------------+--------+ ICA Mid   80      16                                             tortuous +----------+--------+-------+--------+--------------------------------+--------+ ICA Distal77      13                                             tortuous  +----------+--------+-------+--------+--------------------------------+--------+ ECA       133     3                                                       +----------+--------+-------+--------+--------------------------------+--------+ +----------+--------+-------+--------+------------+           PSV cm/sEDV cmsDescribeArm Pressure +----------+--------+-------+--------+------------+ Subclavian84                                  +----------+--------+-------+--------+------------+ +---------+--------+--+--------+--+---------+ VertebralPSV cm/s49EDV cm/s12Antegrade +---------+--------+--+--------+--+---------+ Left Carotid Findings: +----------+--------+--------+--------+-----------------------+--------+           PSV cm/sEDV cm/sStenosisDescribe               Comments +----------+--------+--------+--------+-----------------------+--------+ CCA Prox  95      11                                              +----------+--------+--------+--------+-----------------------+--------+  CCA Distal110     9               smooth and heterogenous         +----------+--------+--------+--------+-----------------------+--------+ ICA Prox  128     20              smooth and heterogenous         +----------+--------+--------+--------+-----------------------+--------+ ICA Mid   132     21                                              +----------+--------+--------+--------+-----------------------+--------+ ICA Distal144     17                                     tortuous +----------+--------+--------+--------+-----------------------+--------+ ECA       103     1                                               +----------+--------+--------+--------+-----------------------+--------+ +----------+--------+--------+--------+------------+ SubclavianPSV cm/sEDV cm/sDescribeArm Pressure +----------+--------+--------+--------+------------+           92                                    +----------+--------+--------+--------+------------+ +---------+--------+--+--------+-+---------+ VertebralPSV cm/s55EDV cm/s7Antegrade +---------+--------+--+--------+-+---------+  ABI Findings: +------------------+-----+-----------+ Rt Pressure (mmHg)IndexWaveform    +------------------+-----+-----------+ 124                    triphasic   +------------------+-----+-----------+ 187               1.51 multiphasic +------------------+-----+-----------+ 161               1.30 multiphasic +------------------+-----+-----------+ +------------------+-----+----------+ Lt Pressure (mmHg)IndexWaveform   +------------------+-----+----------+ 118                    triphasic  +------------------+-----+----------+ 254               2.05 monophasic +------------------+-----+----------+ 58                0.47 monophasic +------------------+-----+----------+ +-------+---------------+ ABI/TBIToday's ABI/TBI +-------+---------------+ Right  1.51            +-------+---------------+ Left   0.47            +-------+---------------+  Right Doppler Findings: +--------+--------+---------+ Site    PressureDoppler   +--------+--------+---------+ [id number]     triphasic +--------+--------+---------+ Radial          triphasic +--------+--------+---------+ Ulnar           triphasic +--------+--------+---------+  Left Doppler Findings: +--------+--------+---------+ Site    PressureDoppler   +--------+--------+---------+ [id number]     triphasic +--------+--------+---------+ Radial          triphasic +--------+--------+---------+ Ulnar           triphasic +--------+--------+---------+   Summary: Right Carotid: Velocities in the right ICA are consistent with a 1-39% stenosis. Left Carotid: Velocities in the left ICA are consistent with a 1-39% stenosis. Vertebrals: Bilateral vertebral arteries demonstrate antegrade flow. Right ABI: Resting  right ankle-brachial index indicates noncompressible right lower extremity arteries. Left ABI:  Resting left ankle-brachial index indicates severe left lower extremity arterial disease. Right Upper Extremity: Doppler waveform obliterate with right radial compression. Doppler waveform obliterate with right ulnar compression. Left Upper Extremity: Doppler waveform obliterate with left radial compression. Doppler waveforms decrease >50% with left ulnar compression.  Electronically signed by Gaile New MD on 11/06/2023 at 8:16:53 AM.    Final    ECHOCARDIOGRAM COMPLETE Result Date: 11/03/2023    ECHOCARDIOGRAM REPORT   Patient Name:   CHRISTROPHER GINTZ Date of Exam: 11/02/2023 Medical Rec #:  [id number]        Height:       71.0 in Accession #:    [id number]       Weight:       153.2 lb Date of Birth:  September 30, 1945       BSA:          1.883 m Patient Age:    8 years         BP:           112/64 mmHg Patient Gender: M                HR:           74 bpm. Exam Location:  Inpatient Procedure: 2D Echo, Cardiac Doppler and Color Doppler (Both Spectral and Color            Flow Doppler were utilized during procedure).                                 MODIFIED REPORT:  This report was modified by Vinie Maxcy MD on 11/03/2023 due to Re-evaluated           aortic insufficiency in light of more recent TEE comparison.  Indications:     Aortic regurgitation  History:         Patient has prior history of Echocardiogram examinations, most                  recent 04/21/2023. CHF, COPD, Signs/Symptoms:Shortness of                  Breath; Risk Factors:Hypertension and Former Smoker.  Sonographer:     Juliene Rucks Referring Phys:  [id number] ZANE ADAMS Diagnosing Phys: Vinie Maxcy MD  Sonographer Comments: Technically difficult study due to poor echo windows. Image acquisition challenging due to COPD and Image acquisition challenging due to respiratory motion. IMPRESSIONS  1. Technically difficult study, images off axis  2. Left  ventricular ejection fraction, by estimation, is 60 to 65%. Left ventricular ejection fraction by PLAX is 61 %. The left ventricle has normal function. The left ventricle has no regional wall motion abnormalities. Left ventricular diastolic parameters are consistent with Grade I diastolic dysfunction (impaired relaxation).  3. Right ventricular systolic function is normal. The right ventricular size is normal.  4. The mitral valve is normal in structure. No evidence of mitral valve regurgitation.  5. Calcified aortic root. There appears to be eccentric, posteriorly directed regurgitation (worst on image 77). The aortic valve was not well visualized. Aortic valve regurgitation is moderate. Aortic valve sclerosis/calcification is present, without any evidence of aortic stenosis. Comparison(s): Changes from prior study are noted. 07/24/2023: LVEF 50-55%, severe AI with oscillating density on the left coronary cusp. FINDINGS  Left Ventricle: Left ventricular ejection fraction, by estimation, is 60 to 65%. Left ventricular ejection fraction by PLAX is 61 %. The  left ventricle has normal function. The left ventricle has no regional wall motion abnormalities. The left ventricular internal cavity size was normal in size. There is no left ventricular hypertrophy. Left ventricular diastolic parameters are consistent with Grade I diastolic dysfunction (impaired relaxation). Indeterminate filling pressures. Right Ventricle: The right ventricular size is normal. No increase in right ventricular wall thickness. Right ventricular systolic function is normal. Left Atrium: Left atrial size was normal in size. Right Atrium: Right atrial size was normal in size. Pericardium: There is no evidence of pericardial effusion. Mitral Valve: The mitral valve is normal in structure. No evidence of mitral valve regurgitation. Tricuspid Valve: The tricuspid valve is not well visualized. Tricuspid valve regurgitation is not demonstrated. Aortic  Valve: Calcified aortic root. There appears to be eccentric, posteriorly directed regurgitation (worst on image 77). The aortic valve was not well visualized. There is moderate aortic valve annular calcification. Aortic valve regurgitation is moderate. Aortic valve sclerosis/calcification is present, without any evidence of aortic stenosis. Pulmonic Valve: The pulmonic valve was normal in structure. Pulmonic valve regurgitation is not visualized. Aorta: The aortic root and ascending aorta are structurally normal, with no evidence of dilitation. Venous: The inferior vena cava was not well visualized. IAS/Shunts: The interatrial septum was not well visualized.  LEFT VENTRICLE PLAX 2D LV EF:         Left            Diastology                ventricular     LV e' medial:    4.97 cm/s                ejection        LV E/e' medial:  7.8                fraction by     LV e' lateral:   7.77 cm/s                PLAX is 61      LV E/e' lateral: 5.0                %. LVIDd:         4.00 cm LVIDs:         2.70 cm LV PW:         1.10 cm LV IVS:        1.00 cm LVOT diam:     2.10 cm LVOT Area:     3.46 cm  RIGHT VENTRICLE RV Basal diam:  3.30 cm RV Mid diam:    3.00 cm RV S prime:     17.60 cm/s TAPSE (M-mode): 1.5 cm LEFT ATRIUM           Index        RIGHT ATRIUM           Index LA diam:      2.70 cm 1.43 cm/m   RA Area:     13.80 cm LA Vol (A4C): 37.3 ml 19.81 ml/m  RA Volume:   33.60 ml  17.85 ml/m  MITRAL VALVE MV Area (PHT): 2.83 cm    SHUNTS MV Decel Time: 268 msec    Systemic Diam: 2.10 cm MV E velocity: 38.80 cm/s MV A velocity: 63.40 cm/s MV E/A ratio:  0.61 Vinie Maxcy MD Electronically signed by Vinie Maxcy MD Signature Date/Time: 11/02/2023/2:12:15 PM    Final (Updated)    CT CHEST WO CONTRAST Result  Date: 11/02/2023 CLINICAL DATA:  CABG workup short of breath EXAM: CT CHEST WITHOUT CONTRAST TECHNIQUE: Multidetector CT imaging of the chest was performed following the standard protocol without IV contrast.  RADIATION DOSE REDUCTION: This exam was performed according to the departmental dose-optimization program which includes automated exposure control, adjustment of the mA and/or kV according to patient size and/or use of iterative reconstruction technique. COMPARISON:  Chest x-ray 11/01/2023, chest CT 07/17/2019 FINDINGS: Cardiovascular: Limited assessment without intravenous contrast. Advanced aortic atherosclerosis. Mild aneurysmal dilatation of the ascending aorta, measuring 4.1 cm maximum. Multi-vessel advanced coronary vascular calcification. Normal cardiac size. No pericardial effusion Mediastinum/Nodes: Patent trachea. No thyroid  mass. No suspicious lymph nodes. Esophagus within normal limits. Lungs/Pleura: Emphysema. Scarring within the left upper and lower lobes. Band like density and thickening along the right posterior pleural surface with a few scattered calcifications suggestive of chronic scarring. Mild right lower lobe bronchiectasis. These findings are new when compared to lung bases from CT of September 2024. A few scattered punctate pulmonary nodules, largest seen in the left base and measures 3 mm on series 3, image 119. Upper Abdomen: No acute finding. Multiple renal cysts incompletely visualize, no specific imaging follow-up is recommended. Numerous pancreatic calcifications with pancreatic atrophy consistent with chronic pancreatitis. Musculoskeletal: No acute osseous abnormality. IMPRESSION: 1. Emphysema. Scarring within the left upper and lower lobes. Band like density and thickening along the right posterior pleural surface with a few scattered calcifications suggestive of chronic scarring but new compared with 2024. Mild right lower lobe bronchiectasis. 2. Few small punctate pulmonary nodules measuring up to 3 mm No follow-up needed if patient is low-risk (and has no known or suspected primary neoplasm). Non-contrast chest CT can be considered in 12 months if patient is high-risk. This  recommendation follows the consensus statement: Guidelines for Management of Incidental Pulmonary Nodules Detected on CT Images: From the Fleischner Society 2017; Radiology 2017; 284:228-243. 3. Aortic atherosclerosis. Mild aneurysmal dilatation of ascending aorta up to 4.1 cm. Recommend annual imaging followup by CTA or MRA. This recommendation follows 2010 ACCF/AHA/AATS/ACR/ASA/SCA/SCAI/SIR/STS/SVM Guidelines for the Diagnosis and Management of Patients with Thoracic Aortic Disease. Circulation. 2010; 121: Z733-z630. Aortic aneurysm NOS (ICD10-I71.9) 4. Findings consistent with chronic pancreatitis Aortic Atherosclerosis (ICD10-I70.0) and Emphysema (ICD10-J43.9). Electronically Signed   By: Luke Bun M.D.   On: 11/02/2023 21:21   VAS US  LOWER EXTREMITY VENOUS (DVT) Result Date: 11/02/2023  Lower Venous DVT Study Patient Name:  KINNETH FUJIWARA  Date of Exam:   11/02/2023 Medical Rec #: [id number]         Accession #:    [id number] Date of Birth: [birth year]        Patient Gender: M Patient Age:   36 years Exam Location:  [facility] Procedure:      VAS US  LOWER EXTREMITY VENOUS (DVT) Referring Phys: EKTA PATEL --------------------------------------------------------------------------------  Indications: SOB, and Edema (Leg size one is larger than other).  Comparison Study: No previous exams Performing Technologist: Jody Hill RVT, RDMS  Examination Guidelines: A complete evaluation includes B-mode imaging, spectral Doppler, color Doppler, and power Doppler as needed of all accessible portions of each vessel. Bilateral testing is considered an integral part of a complete examination. Limited examinations for reoccurring indications may be performed as noted. The reflux portion of the exam is performed with the patient in reverse Trendelenburg.  +---------+---------------+---------+-----------+----------+--------------+ RIGHT    CompressibilityPhasicitySpontaneityPropertiesThrombus Aging  +---------+---------------+---------+-----------+----------+--------------+ CFV      Full  Yes      Yes                                 +---------+---------------+---------+-----------+----------+--------------+ SFJ      Full                                                        +---------+---------------+---------+-----------+----------+--------------+ FV Prox  Full           Yes      Yes                                 +---------+---------------+---------+-----------+----------+--------------+ FV Mid   Full           Yes      Yes                                 +---------+---------------+---------+-----------+----------+--------------+ FV DistalFull           Yes      Yes                                 +---------+---------------+---------+-----------+----------+--------------+ PFV      Full                                                        +---------+---------------+---------+-----------+----------+--------------+ POP      Full           Yes      Yes                                 +---------+---------------+---------+-----------+----------+--------------+ PTV      Full                                                        +---------+---------------+---------+-----------+----------+--------------+ PERO     Full                                                        +---------+---------------+---------+-----------+----------+--------------+   +---------+---------------+---------+-----------+----------+--------------+ LEFT     CompressibilityPhasicitySpontaneityPropertiesThrombus Aging +---------+---------------+---------+-----------+----------+--------------+ CFV      Full           Yes      Yes                                 +---------+---------------+---------+-----------+----------+--------------+ SFJ      Full                                                         +---------+---------------+---------+-----------+----------+--------------+  FV Prox  Full           Yes      Yes                                 +---------+---------------+---------+-----------+----------+--------------+ FV Mid   Full           Yes      Yes                                 +---------+---------------+---------+-----------+----------+--------------+ FV DistalFull           Yes      Yes                                 +---------+---------------+---------+-----------+----------+--------------+ PFV      Full                                                        +---------+---------------+---------+-----------+----------+--------------+ POP      Full           Yes      Yes                                 +---------+---------------+---------+-----------+----------+--------------+ PTV      Full                                                        +---------+---------------+---------+-----------+----------+--------------+ PERO     Full                                                        +---------+---------------+---------+-----------+----------+--------------+     Summary: BILATERAL: - No evidence of deep vein thrombosis seen in the lower extremities, bilaterally. -No evidence of popliteal cyst, bilaterally.   *See table(s) above for measurements and observations. Electronically signed by Debby Robertson on 11/02/2023 at 5:37:58 PM.    Final    DG Chest 2 View Result Date: 11/01/2023 CLINICAL DATA:  Dyspnea on exertion. EXAM: CHEST - 2 VIEW COMPARISON:  04/24/2023 and 04/20/2023 as well as 11/05/2022 FINDINGS: Lungs are adequately inflated without acute airspace consolidation or effusion. No pneumothorax. Possible small nodular opacity over the right mid to lower lung. Cardiomediastinal silhouette and remainder of the exam is unchanged. IMPRESSION: 1. No acute cardiopulmonary disease. 2. Possible small nodular opacity over the right mid to lower lung.  Recommend follow-up chest radiograph 3 months. If persistent, recommend noncontrast chest CT for further evaluation. Electronically Signed   By: Toribio Agreste M.D.   On: 11/01/2023 10:50   Labs: CBC: Recent Labs  Lab 11/01/23 1109 11/02/23 0515 11/03/23 0721 11/04/23 9346 11/06/23 0442 11/06/23 1159 11/06/23 1202 11/07/23 0442 11/08/23 0606  WBC 8.4 7.9 10.3 7.7 6.7  --   --  6.0 6.6  NEUTROABS 5.9 5.3  --   --   --   --   --   --   --   HGB 13.1 12.2* 12.5* 11.7* 10.6* 9.2* 9.2* 10.8* 10.5*  HCT 38.0* 36.2* 36.8* 35.7* 31.1* 27.0* 27.0* 32.6* 30.8*  MCV 90.0 91.4 89.1 94.4 90.9  --   --  93.1 91.7  PLT 146* 145* 140* 107* 125*  --   --  130* 129*   Basic Metabolic Panel: Recent Labs  Lab 11/02/23 1023 11/03/23 0418 11/04/23 0653 11/04/23 1648 11/05/23 0433 11/06/23 0442 11/06/23 1159 11/06/23 1202 11/07/23 0442 11/08/23 0606  NA 132*   < > 136 135 137 139 140 140 139 137  K 4.5   < > 4.0 3.8 4.0 4.1 4.1 4.1 4.5 3.9  CL 105   < > 110 105 104 108  --   --  109 106  CO2 15*   < > 15* 20* 21* 20*  --   --  18* 19*  GLUCOSE 147*   < > 156* 192* 132* 123*  --   --  116* 218*  BUN 51*   < > 54* 52* 49* 44*  --   --  34* 35*  CREATININE 3.07*   < > 2.96* 2.98* 2.87* 2.71*  --   --  2.55* 2.31*  CALCIUM  8.0*   < > 8.1* 8.4* 8.5* 8.4*  --   --  8.5* 8.6*  MG 2.8*   < > 2.7*  --  2.8* 2.7*  --   --  2.4 2.4  PHOS 4.6  --   --   --   --   --   --   --   --   --    < > = values in this interval not displayed.   Liver Function Tests: Recent Labs  Lab 11/01/23 1109 11/02/23 1023 11/03/23 0418  AST 29 21 19   ALT 10 12 11   ALKPHOS 101 77 70  BILITOT 0.5 1.0 0.7  PROT 6.9 6.2* 6.3*  ALBUMIN 3.7 3.2* 3.2*   CBG: Recent Labs  Lab 11/07/23 0822 11/07/23 1222 11/07/23 1613 11/07/23 2029 11/08/23 0815  GLUCAP 121* 230* 157* 132* 250*    Scheduled Meds:  [MAR Hold] amitriptyline   50 mg Oral QHS   [MAR Hold] amLODipine   5 mg Oral Daily   [MAR Hold] aspirin EC  81 mg  Oral Daily   chlorhexidine   15 mL Mouth/Throat Once   Or   mouth rinse  15 mL Mouth Rinse Once   epinephrine   0-10 mcg/min Intravenous To OR   [MAR Hold] feeding supplement  237 mL Oral BID BM   [MAR Hold] heparin  injection (subcutaneous)  5,000 Units Subcutaneous Q12H   heparin  sodium (porcine) 2,500 Units, papaverine 30 mg in electrolyte-A (PLASMALYTE-A PH 7.4) 500 mL irrigation   Irrigation To OR   [MAR Hold] hydrALAZINE   50 mg Oral Q8H   [MAR Hold] insulin  aspart  0-15 Units Subcutaneous TID WC   insulin    Intravenous To OR   [place] Blood Cardioplegia vial (lidocaine /magnesium /mannitol 0.26g-4g-6.4g)   Intracoronary To OR   [MAR Hold] melatonin  3 mg Oral QHS   [MAR Hold] pantoprazole   40 mg Oral Daily   phenylephrine   30-200 mcg/min Intravenous To OR   [MAR Hold] polyethylene glycol  17 g Oral Daily   potassium chloride   80 mEq Other To OR   [MAR Hold] rOPINIRole   0.25 mg Oral TID   [  MAR Hold] rosuvastatin   40 mg Oral Daily   [MAR Hold] sodium chloride  flush  3 mL Intravenous Q12H   tranexamic acid  15 mg/kg Intravenous To OR   tranexamic acid  2 mg/kg Intracatheter To OR   Continuous Infusions:  sodium chloride       ceFAZolin  (ANCEF ) IV      ceFAZolin  (ANCEF ) IV     dexmedetomidine     heparin  30,000 units/NS 1000 mL solution for CELLSAVER     lactated ringers      milrinone     nitroGLYCERIN      norepinephrine     [MAR Hold] sodium chloride      tranexamic acid (CYKLOKAPRON) 2,500 mg in sodium chloride  0.9 % 250 mL (10 mg/mL) infusion     vancomycin     PRN Meds:.[MAR Hold] artificial tears, [MAR Hold] bisacodyl , [MAR Hold] ipratropium-albuterol , [MAR Hold] oxyCODONE , [MAR Hold] sodium chloride  flush, [MAR Hold] SUMAtriptan , [MAR Hold] zolpidem   Length of inpatient stay: 6 days   Joette Pebbles, MD 11/08/2023 10:20 AM  For on call review [email or web address].

## 2023-11-08 NOTE — Procedures (Signed)
 Extubation Procedure Note  Patient Details:   Name: Michael Bender DOB: 1945-04-30 MRN: [id number]   Airway Documentation:    Vent end date: 11/08/23 Vent end time: 2209   Evaluation  O2 sats: stable throughout Complications: No apparent complications Patient did tolerate procedure well. Bilateral Breath Sounds: Clear   Yes  Pt extubated to 4L [place] per rapid wean protocol. NIF -25, VC 2L, positive cuff leak noted prior to extubation.   Damien FORBES Rummer 11/08/2023, 10:57 PM

## 2023-11-08 NOTE — Brief Op Note (Signed)
 11/01/2023 - 11/08/2023  3:04 PM  PATIENT:  Michael Bender  78 y.o. male  PRE-OPERATIVE DIAGNOSIS:  SEVERE AI  POST-OPERATIVE DIAGNOSIS:  SEVERE AI  PROCEDURE: INTRAOPERATIVE TRANSESOPHAGEAL ECHOCARDIOGRAM, AORTIC VALVE REPLACEMENT USING (Model # 11500A, Serial # [id number], Size ) INSPIRIS VALVE.  CLIPPING OF LEFT ATRIAL APPENDAGE USING A CLIP   SURGEON:  Surgeons and Role:    [name], MD - Primary  PHYSICIAN ASSISTANTS: 1. Tilla Wilborn PA-C 2. Wayne Gold PA-C  ANESTHESIA:   general  EBL: per perfusion, anesthesia record  DRAINS: Chest tubes placed in the mediastinal and pleural spaces   SPECIMEN:  Source of Specimen:  Native AV leaflets  DISPOSITION OF SPECIMEN:  PATHOLOGY  COUNTS CORRECT:  YES  DICTATION: .Dragon Dictation  PLAN OF CARE: Admit to inpatient   PATIENT DISPOSITION:  ICU - intubated and hemodynamically stable.   Delay start of Pharmacological VTE agent (>24hrs) due to surgical blood loss or risk of bleeding: yes  BASELINE WEIGHT: 62.1 kg

## 2023-11-08 NOTE — Discharge Summary (Signed)
 [address] [place] [address]             [phone]        Physician Discharge Summary  Patient ID: Michael Bender MRN: [id number] DOB/AGE: Nov 06, 1945 78 y.o.  Admit date: 11/01/2023 Discharge date: 11/15/2023  Admission Diagnoses:  Patient Active Problem List   Diagnosis Date Noted   S/P aortic valve replacement with bioprosthetic valve 11/13/2023   Protein-calorie malnutrition, severe 11/11/2023   SOB (shortness of breath) 11/01/2023   Sleep disturbance 10/12/2023   Diabetes mellitus, new onset (HCC) 10/12/2023   Chronic kidney disease, stage 4 (severe) (HCC) 10/12/2023   Restless legs 10/12/2023   Hyperkalemia 10/12/2023   Postlaminectomy syndrome, not elsewhere classified 09/14/2023   Fecal incontinence alternating with constipation 09/14/2023   Elevated BUN 08/14/2023   Severe aortic regurgitation 08/14/2023   Stage 3 chronic kidney disease (HCC) 05/19/2023   Orthostatic hypotension 04/22/2023   Chronic heart failure with preserved ejection fraction (HFpEF, >= 50%) (HCC) 04/20/2023   Balance problems 02/21/2023   Frequent falls 02/17/2023   Nonintractable headache 02/17/2023   Memory loss 01/19/2023   Neck pain 11/08/2022   Acute kidney injury superimposed on chronic kidney disease 10/28/2022   Gynecomastia, male 04/05/2022   High risk medication use 03/03/2022   Opioid dependence (HCC) 12/14/2020   Atherosclerosis of aorta 12/14/2020   Duodenal stenosis    HLD (hyperlipidemia) 07/18/2019   Iron deficiency anemia    Anemia 07/17/2019   History of CVA (cerebrovascular accident) 02/18/2019   Peripheral arterial disease 12/11/2018   Insomnia 11/20/2018   Esophageal dysphagia 07/15/2011   Bilateral leg pain 05/03/2010   CONSTIPATION, CHRONIC 10/07/2008   GERD 04/04/2007   COPD (chronic obstructive pulmonary disease) (HCC) 03/26/2007   Primary hypertension 03/05/2007   Chronic bilateral low back pain with bilateral  sciatica 10/06/2006     Discharge Diagnoses:  Patient Active Problem List   Diagnosis Date Noted   S/P aortic valve replacement with bioprosthetic valve 11/13/2023   Protein-calorie malnutrition, severe 11/11/2023   SOB (shortness of breath) 11/01/2023   Sleep disturbance 10/12/2023   Diabetes mellitus, new onset (HCC) 10/12/2023   Chronic kidney disease, stage 4 (severe) (HCC) 10/12/2023   Restless legs 10/12/2023   Hyperkalemia 10/12/2023   Postlaminectomy syndrome, not elsewhere classified 09/14/2023   Fecal incontinence alternating with constipation 09/14/2023   Elevated BUN 08/14/2023   Severe aortic regurgitation 08/14/2023   Stage 3 chronic kidney disease (HCC) 05/19/2023   Orthostatic hypotension 04/22/2023   Chronic heart failure with preserved ejection fraction (HFpEF, >= 50%) (HCC) 04/20/2023   Balance problems 02/21/2023   Frequent falls 02/17/2023   Nonintractable headache 02/17/2023   Memory loss 01/19/2023   Neck pain 11/08/2022   Acute kidney injury superimposed on chronic kidney disease 10/28/2022   Gynecomastia, male 04/05/2022   High risk medication use 03/03/2022   Opioid dependence (HCC) 12/14/2020   Atherosclerosis of aorta 12/14/2020   Duodenal stenosis    HLD (hyperlipidemia) 07/18/2019   Iron deficiency anemia    Anemia 07/17/2019   History of CVA (cerebrovascular accident) 02/18/2019   Peripheral arterial disease 12/11/2018   Insomnia 11/20/2018   Esophageal dysphagia 07/15/2011   Bilateral leg pain 05/03/2010   CONSTIPATION, CHRONIC 10/07/2008   GERD 04/04/2007   COPD (chronic obstructive pulmonary disease) (HCC) 03/26/2007   Primary hypertension 03/05/2007   Chronic bilateral low back pain with bilateral sciatica 10/06/2006    Discharged  Condition: Stable  HPI: This is a 78 year old male with a past medical history of hypertension, hyperlipidemia, uncontrolled T2DM (last HgbA1C 8.4), paroxysmal atrial fibrillation not on anticoagulation  due to falls, BPH, history of GI bleed due to duodenal ulcer, AKI on CKD stage IIIb, peripheral arterial disease, chronic iron deficiency anemia, prior tobacco abuse quit 5 years ago, COPD, combined systolic and diastolic heart failure, and degenerative spine disease S/P multiple surgeries. CVA is listed in his problem list but patient denies stroke history. The patient presented to the hospital in September 2024 after multiple falls with traumatic rhabdomyolysis and acute kidney failure and then again October 2025 after multiple syncopal episodes. At that time he underwent I&D of the left wrist for septic arthritis and was started on IV antibiotics for MSSA sepsis. 2D echocardiogram on 11/07/22 showed a trileaflet aortic valve with no evidence of valvular abnormality and LVEF was 60-65%. Follow up TTE on 04/21/23 showed LVEF 40-45% with global hypokinesis but still with no valvular abnormality. Cardiac MRI on 06/06/23 showed multiple jets of aortic insufficiency with moderate to severe regurgitation. TEE on 07/24/23 showed LVEF 50-55%, mildly reduced right ventricular systolic function, an oscillating linear density (0.7-0.8cm) on the left coronary cusp of the aortic valve concerning for vegetation with severe aortic insufficiency, mild mitral valve regurgitation, and moderate (grade III) plaque of the descending aorta. Antibiotics were not started at this time per ID as it was felt the vegetation was related to previous infection. He was seen by Dr. Lucas in our office on 08/30/23 for cardiothoracic surgical consult. At that time he remained active without limitation, denied shortness of breath, fatigue, orthopnea, peripheral edema, and dizziness. Dr. Lucas reported the patient looked frail and chronically ill but felt he was a candidate for aortic valve replacement to prevent progressive left ventricular dysfunction and worsening heart failure symptoms. The patient is not a TAVR candidate with pure aortic  insufficiency. The patient decided to hold off on surgery and continued medical therapy and observation.    The patient presented to the ED on 10/01 with complaints of exertional dyspnea since Saturday. He reports he could barely walk 5 feet without getting short of breath and reports overall weakness. He denies chest pain, fever, cough, and peripheral edema. He reports he has not had an appetite since Saturday. His doctor put him on Ozempic  for diabetes but he has not taken that in 2 weeks. The family also reports he has not been eating or drinking much over the last few days. He had an AKI on CKD with creatinine upon arrival 3.0, proBNP 634 and troponin I (high sensitivity) 67>62 most likely due to demand ischemia. EKG was without acute ischemia and showed NSR with a prolonged Qtc. Blood cultures were collected  and show no growth to date. Echocardiogram 11/02/23 read LVEF 60-65% and mild aortic regurgitation but the aortic valve is not well visualized...unsure if correct? Upon ED arrival the patient was still declining surgery but the patient is now agreeable to surgery if he is a candidate.    The patient becomes hypoxic and extremely dyspneic when walking about 40 feet. He currently lives alone and his sons check on him. He reports he is able to complete his ADLs but has to sit and rest frequently. He continues to look very frail/cachectic and chronically ill.  He needed to undergo cardiac catheterization and this was not able to be done until 10/06 due to AKI on CKD (stage IIIb). Results showed mild diffuse  calcific plaquing but no significant stenoses. Dr. Daniel discussed the need for median sternotomy for aortic valve replacement and LA clip. Pre operative carotid duplex US  showed no significant internal carotid artery stenosis bilaterally.  Hospital Course: Patient underwent an aortic valve replacement with a 21 mm Edwards Inspiris bioprosthetic valve.  Following the procedure, he was transferred to  the surgical ICU in stable condition.  He was hemodynamically stable.  He was weaned from the ventilator using standard protocols and extubated by around 10 PM on the day of surgery.  At around 11 PM.  Patient was noted to have decreased level of consciousness, disorientation, and would not follow commands.  He had a right facial droop along with bilateral arm and lower extremity weakness.  He had right side neglect on exam.  A code stroke was initiated.  CT scan of the head and neck were negative for any acute findings.  The patient was seen by neurology.  He was loaded with Keppra for suspected seizure activity.  An EEG was showing no seizures or epileptiform discharges.  There was evidence of generalized cerebral dysfunction/encephalopathy likely related to sedation.  Mr. Kling remained hemodynamically stable and was not requiring any vasopressor or inotropic support on postop day 1.  Low-dose metoprolol  was started.  His mental states improved significantly. He was in NSR and his pacing wires were removed without difficulty. Neurology recommended MRI of the brain which was performed on 11/10/2023 and showed moderately severe chronic small vessel ischemic disease but not acute abnormality. His creatinine level improved and he was started on lasix  to help facilitate diuresis. He developed atrial fibrillation with RVR, he was started on IV Amiodarone protocol. PICC line was placed since the patient continued to pull on his IV lines and there was concern of Amiodarone infiltration. Chest tubes and foley were removed on POD3 without complication. He developed an AKI on CKD stage 3b, diuresis was held. Once creatinine improved closer to baseline, he was diuresed. Creatinine continued to improve and was 1.9 prior to discharge. PT/OT recommended CIR at discharge, CIR began following. He was stable for transfer from [facility] ICU to 4E for further convalescence on 10/14. He became more hypertensive so Lopressor  was  increased to 50 mg bid and Amlodipine  was increased to 10 mg daily. He has been ambulating on room air with good oxygenation. All wounds are clean, dry, healing without signs of infection. He has been tolerating a diet and has had a bowel movement. Of note, he received Oxy HCL 10 mg prescription at the end of September to 90 pills. Therefore, he will not receive any additional Oxy post op. Insurance has approved CIR and he is stable for discharge today.  Consults: pulmonary/intensive care  Significant Diagnostic Studies:  Narrative & Impression  CLINICAL DATA:  Status post cardiac surgery.   EXAM: CHEST - 2 VIEW   COMPARISON:  11/13/2023   FINDINGS: Lungs are hyperexpanded. No pneumothorax or substantial pleural effusion. No focal consolidation or evidence of pulmonary edema. The cardiopericardial silhouette is within normal limits for size. Right PICC line tip overlies the mid to lower SVC level. Telemetry leads overlie the chest.   IMPRESSION: Hyperexpansion without acute cardiopulmonary findings.     Electronically Signed   By: Camellia Candle M.D.   On: 11/14/2023 05:44   Narrative & Impression  CLINICAL DATA:  Status post aortic valve replacement and left atrial appendage clip placement on 11/08/2023.   EXAM: PORTABLE CHEST 1 VIEW   COMPARISON:  11/08/2023  FINDINGS: The cardiac silhouette remains borderline enlarged. Stable right jugular Swan-Ganz catheter with its tip in the main pulmonary artery segment or proximal right main pulmonary artery. The endotracheal and nasogastric tubes have been removed. Stable mediastinal tubes without pneumomediastinum. Interval mild-to-moderate left lower lobe atelectasis. Stable mild peribronchial thickening and chronic accentuation of the interstitial markings. Unremarkable bones.   IMPRESSION: 1. Interval mild-to-moderate left lower lobe atelectasis. 2. Stable mild bronchitic changes. 3. Stable borderline cardiomegaly.      Electronically Signed   By: Elspeth Bathe M.D.   On: 11/09/2023 11:06      Treatments: surgery:  Median Sternotomy Extracorporeal circulation 3.   Aortic valve replacement using a 27 mm Inspiris valve 4.  Left atrial appendage ligation by Dr. Daniel on 11/08/2023.  Discharge Exam: Cardiovascular: RRR, no murmur Pulmonary: Clear to auscultation bilaterally Abdomen: Soft, non tender, bowel sounds present. Extremities: No lower extremity edema. Wound: Clean and dry.  No erythema or signs of infection.   Discharge Medications:  The patient has been discharged on:   1.Beta Blocker:  Yes [ x  ]                              No   [   ]                              If No, reason:  2.Ace Inhibitor/ARB: Yes [   ]                                     No  [   x ]                                     If No, reason:AKI on CKD (stage IIIb)  3.Statin:   Yes [ x  ]                  No  [   ]                  If No, reason:  4.Ecasa:  Yes  [  x ]                  No   [   ]                  If No, reason:  Patient had ACS upon admission:No  Plavix/P2Y12 inhibitor: Yes [   ]                                      No  [  x ]      Allergies as of 11/15/2023       Reactions   Nubain [nalbuphine Hcl]    Muscle contraction        Medication List     STOP taking these medications    bisoprolol  5 MG tablet Commonly known as: ZEBETA    cefadroxil  500 MG capsule Commonly known as: DURICEF   hydrALAZINE  50 MG tablet Commonly known as: APRESOLINE    Integra Plus  Caps   isosorbide  mononitrate 60 MG 24 hr tablet  Commonly known as: IMDUR    Lokelma  10 g Pack packet Generic drug: sodium zirconium cyclosilicate    metoprolol  succinate 25 MG 24 hr tablet Commonly known as: TOPROL -XL   predniSONE 10 MG tablet Commonly known as: DELTASONE   promethazine  25 MG suppository Commonly known as: PHENERGAN    sacubitril -valsartan  24-26 MG Commonly known as: ENTRESTO     spironolactone  25 MG tablet Commonly known as: ALDACTONE        TAKE these medications    amiodarone 200 MG tablet Commonly known as: PACERONE Take 400 mg by mouth two times daily for 2 days;then take 200 mg by mouth two times daily for 7 days;then take Amiodarone 200 mg daily thereafter   amitriptyline  50 MG tablet Commonly known as: ELAVIL  Take 1 tablet (50 mg total) by mouth at bedtime.   amLODipine  10 MG tablet Commonly known as: NORVASC  Take 1 tablet (10 mg total) by mouth daily. Start taking on: November 16, 2023 What changed:  medication strength how much to take   aspirin EC 81 MG tablet Take 1 tablet (81 mg total) by mouth daily. Swallow whole. Start taking on: November 16, 2023   bisacodyl  5 MG EC tablet Generic drug: bisacodyl  Take 1 tablet (5 mg total) by mouth daily as needed for moderate constipation.   Blood Glucose Monitoring Suppl Devi 1 each by Does not apply route in the morning, at noon, and at bedtime. May substitute to any manufacturer covered by patient's insurance.   BLOOD GLUCOSE TEST STRIPS Strp 1 each by In Vitro route in the morning, at noon, and at bedtime. May substitute to any manufacturer covered by patient's insurance.   cyclobenzaprine  10 MG tablet Commonly known as: FLEXERIL  Take 1 tablet (10 mg total) by mouth 3 (three) times daily as needed.   FreeStyle Libre 3 Plus Sensor Misc Change sensor every 15 days.   furosemide  40 MG tablet Commonly known as: LASIX  Take 1 tablet (40 mg total) by mouth daily. For 5 days then stop Start taking on: November 16, 2023   ipratropium-albuterol  0.5-2.5 (3) MG/3ML Soln Commonly known as: DUONEB Take 3 mLs by nebulization every 4 (four) hours as needed.   Jardiance  10 MG Tabs tablet Generic drug: empagliflozin  Take 10 mg by mouth every morning.   Lancet Device Misc 1 each by Does not apply route in the morning, at noon, and at bedtime. May substitute to any manufacturer covered by patient's  insurance.   Lancets Misc. Misc 1 each by Does not apply route in the morning, at noon, and at bedtime. May substitute to any manufacturer covered by patient's insurance.   levETIRAcetam 500 MG tablet Commonly known as: KEPPRA Take 1 tablet (500 mg total) by mouth 2 (two) times daily.   Melatonin 1 MG Caps Take 1 capsule (1 mg total) by mouth at bedtime.   metoprolol  tartrate 50 MG tablet Commonly known as: LOPRESSOR  Take 1 tablet (50 mg total) by mouth 2 (two) times daily.   multivitamin with minerals Tabs tablet Take 1 tablet by mouth daily. Start taking on: November 16, 2023   Nebulizer/Tubing/Mouthpiece Kit 1 each by Does not apply route every 4 (four) hours as needed.   Nebulizer/Tubing/Mouthpiece Kit 1 each by Does not apply route every 4 (four) hours as needed.   ondansetron  4 MG disintegrating tablet Commonly known as: ZOFRAN -ODT Take 1 tablet (4 mg total) by mouth every 8 (eight) hours as needed for nausea or vomiting.   Oxycodone  HCl 10 MG Tabs Take 1 tablet (10 mg  total) by mouth every 8 (eight) hours as needed.   Ozempic  (0.25 or 0.5 MG/DOSE) 2 MG/3ML Sopn Generic drug: Semaglutide (0.25 or 0.5MG /DOS) Inject 0.25 mg into the skin once a week.   pantoprazole  40 MG tablet Commonly known as: PROTONIX  Take 1 tablet (40 mg total) by mouth daily.   polyethylene glycol 17 g packet Commonly known as: MIRALAX  / GLYCOLAX  Take 17 g by mouth daily as needed for moderate constipation.   rOPINIRole  0.25 MG tablet Commonly known as: REQUIP  Take 1 tablet (0.25 mg total) by mouth 3 (three) times daily as needed (restless leg).   rosuvastatin  40 MG tablet Commonly known as: CRESTOR  TAKE 1 TABLET BY MOUTH DAILY. REPLACES ATORVASTATIN  (STOP ATORVASTATIN  IF STILL TAKING)   SUMAtriptan  50 MG tablet Commonly known as: IMITREX  TAKE 1 TABLET (50 MG TOTAL) BY MOUTH DAILY. MAY REPEAT IN 2 HOURS IF HEADACHE PERSISTS OR RECURS.        Follow-up Information     Su, Con RAMAN,  MD. Go on 11/23/2023.   Specialty: Cardiothoracic Surgery Why: Please arrive by 12:00 pm in order to have PA/LAT CXR taken PRIOR to office appointment with Dr. Daniel. PA/LAT CXR is located on the SECOND floor in same building as Dr. Linward office. Appointment time is at 1:00 pm Contact information: [address] [place] [address] [phone]         [name], NP. Go on 12/04/2023.   Specialty: Cardiology Why: Appointment time is at 10:55 am Contact information: [address] [place] [address] [phone]         Echocardiogram. Go on 12/26/2023.   Why: Appointment time is at 7:35 am Contact information: [address] Phone [phone]                Signed:  Kyla CHRISTELLA Donald, PA-C 11/15/2023, 12:17 PM

## 2023-11-09 ENCOUNTER — Inpatient Hospital Stay (HOSPITAL_COMMUNITY)

## 2023-11-09 ENCOUNTER — Encounter (HOSPITAL_COMMUNITY): Payer: Self-pay

## 2023-11-09 DIAGNOSIS — R569 Unspecified convulsions: Secondary | ICD-10-CM | POA: Diagnosis not present

## 2023-11-09 LAB — BASIC METABOLIC PANEL WITH GFR
Anion gap: 11 (ref 5–15)
Anion gap: 11 (ref 5–15)
BUN: 20 mg/dL (ref 8–23)
BUN: 17 mg/dL (ref 8–23)
CO2: 21 mmol/L — ABNORMAL LOW (ref 22–32)
CO2: 19 mmol/L — ABNORMAL LOW (ref 22–32)
Calcium: 7.6 mg/dL — ABNORMAL LOW (ref 8.9–10.3)
Calcium: 7.6 mg/dL — ABNORMAL LOW (ref 8.9–10.3)
Chloride: 108 mmol/L (ref 98–111)
Chloride: 106 mmol/L (ref 98–111)
Creatinine, Ser: 1.71 mg/dL — ABNORMAL HIGH (ref 0.61–1.24)
Creatinine, Ser: 1.82 mg/dL — ABNORMAL HIGH (ref 0.61–1.24)
GFR, Estimated: 40 mL/min — ABNORMAL LOW (ref >60)
GFR, Estimated: 38 mL/min — ABNORMAL LOW (ref >60)
Glucose, Bld: 136 mg/dL — ABNORMAL HIGH (ref 70–99)
Glucose, Bld: 151 mg/dL — ABNORMAL HIGH (ref 70–99)
Potassium: 3.5 mmol/L (ref 3.5–5.1)
Potassium: 4.1 mmol/L (ref 3.5–5.1)
Sodium: 140 mmol/L (ref 135–145)
Sodium: 136 mmol/L (ref 135–145)

## 2023-11-09 LAB — POCT I-STAT 7, (LYTES, BLD GAS, ICA,H+H)
Acid-base deficit: 1 mmol/L (ref 0.0–2.0)
Acid-base deficit: 4 mmol/L — ABNORMAL HIGH (ref 0.0–2.0)
Acid-base deficit: 4 mmol/L — ABNORMAL HIGH (ref 0.0–2.0)
Bicarbonate: 21 mmol/L (ref 20.0–28.0)
Bicarbonate: 21.3 mmol/L (ref 20.0–28.0)
Bicarbonate: 22.4 mmol/L (ref 20.0–28.0)
Calcium, Ion: 1.05 mmol/L — ABNORMAL LOW (ref 1.15–1.40)
Calcium, Ion: 1.12 mmol/L — ABNORMAL LOW (ref 1.15–1.40)
Calcium, Ion: 1.13 mmol/L — ABNORMAL LOW (ref 1.15–1.40)
HCT: 26 % — ABNORMAL LOW (ref 39.0–52.0)
HCT: 27 % — ABNORMAL LOW (ref 39.0–52.0)
HCT: 29 % — ABNORMAL LOW (ref 39.0–52.0)
Hemoglobin: 8.8 g/dL — ABNORMAL LOW (ref 13.0–17.0)
Hemoglobin: 9.2 g/dL — ABNORMAL LOW (ref 13.0–17.0)
Hemoglobin: 9.9 g/dL — ABNORMAL LOW (ref 13.0–17.0)
O2 Saturation: 98 %
O2 Saturation: 99 %
O2 Saturation: 99 %
Patient temperature: 36.1
Patient temperature: 36.3
Potassium: 3.7 mmol/L (ref 3.5–5.1)
Potassium: 3.8 mmol/L (ref 3.5–5.1)
Potassium: 4 mmol/L (ref 3.5–5.1)
Sodium: 142 mmol/L (ref 135–145)
Sodium: 142 mmol/L (ref 135–145)
Sodium: 143 mmol/L (ref 135–145)
TCO2: 22 mmol/L (ref 22–32)
TCO2: 22 mmol/L (ref 22–32)
TCO2: 23 mmol/L (ref 22–32)
pCO2 arterial: 32.8 mmHg (ref 32–48)
pCO2 arterial: 35.3 mmHg (ref 32–48)
pCO2 arterial: 36.8 mmHg (ref 32–48)
pH, Arterial: 7.361 (ref 7.35–7.45)
pH, Arterial: 7.385 (ref 7.35–7.45)
pH, Arterial: 7.443 (ref 7.35–7.45)
pO2, Arterial: 123 mmHg — ABNORMAL HIGH (ref 83–108)
pO2, Arterial: 154 mmHg — ABNORMAL HIGH (ref 83–108)
pO2, Arterial: 97 mmHg (ref 83–108)

## 2023-11-09 LAB — GLUCOSE, CAPILLARY
Glucose-Capillary: 101 mg/dL — ABNORMAL HIGH (ref 70–99)
Glucose-Capillary: 111 mg/dL — ABNORMAL HIGH (ref 70–99)
Glucose-Capillary: 120 mg/dL — ABNORMAL HIGH (ref 70–99)
Glucose-Capillary: 128 mg/dL — ABNORMAL HIGH (ref 70–99)
Glucose-Capillary: 130 mg/dL — ABNORMAL HIGH (ref 70–99)
Glucose-Capillary: 135 mg/dL — ABNORMAL HIGH (ref 70–99)
Glucose-Capillary: 140 mg/dL — ABNORMAL HIGH (ref 70–99)
Glucose-Capillary: 143 mg/dL — ABNORMAL HIGH (ref 70–99)
Glucose-Capillary: 147 mg/dL — ABNORMAL HIGH (ref 70–99)
Glucose-Capillary: 159 mg/dL — ABNORMAL HIGH (ref 70–99)
Glucose-Capillary: 65 mg/dL — ABNORMAL LOW (ref 70–99)

## 2023-11-09 LAB — PREPARE PLATELET PHERESIS
Unit division: 0
Unit division: 0

## 2023-11-09 LAB — CBC
HCT: 32 % — ABNORMAL LOW (ref 39.0–52.0)
HCT: 32.4 % — ABNORMAL LOW (ref 39.0–52.0)
Hemoglobin: 10.8 g/dL — ABNORMAL LOW (ref 13.0–17.0)
Hemoglobin: 10.9 g/dL — ABNORMAL LOW (ref 13.0–17.0)
MCH: 29.8 pg (ref 26.0–34.0)
MCH: 29.5 pg (ref 26.0–34.0)
MCHC: 33.8 g/dL (ref 30.0–36.0)
MCHC: 33.6 g/dL (ref 30.0–36.0)
MCV: 88.2 fL (ref 80.0–100.0)
MCV: 87.8 fL (ref 80.0–100.0)
Platelets: 87 K/uL — ABNORMAL LOW (ref 150–400)
Platelets: 87 K/uL — ABNORMAL LOW (ref 150–400)
RBC: 3.63 MIL/uL — ABNORMAL LOW (ref 4.22–5.81)
RBC: 3.69 MIL/uL — ABNORMAL LOW (ref 4.22–5.81)
RDW: 17.6 % — ABNORMAL HIGH (ref 11.5–15.5)
RDW: 17.8 % — ABNORMAL HIGH (ref 11.5–15.5)
WBC: 8 K/uL (ref 4.0–10.5)
WBC: 10.8 K/uL — ABNORMAL HIGH (ref 4.0–10.5)
nRBC: 0 % (ref 0.0–0.2)
nRBC: 0 % (ref 0.0–0.2)

## 2023-11-09 LAB — SURGICAL PATHOLOGY

## 2023-11-09 LAB — BPAM PLATELET PHERESIS
Blood Product Expiration Date: 202510122359
Blood Product Expiration Date: 202510122359
ISSUE DATE / TIME: 202510081629
ISSUE DATE / TIME: 202510081703
Unit Type and Rh: 5100
Unit Type and Rh: 5100

## 2023-11-09 LAB — MAGNESIUM
Magnesium: 3 mg/dL — ABNORMAL HIGH (ref 1.7–2.4)
Magnesium: 2.5 mg/dL — ABNORMAL HIGH (ref 1.7–2.4)

## 2023-11-09 MED ORDER — INSULIN GLARGINE 100 UNIT/ML ~~LOC~~ SOLN
6.0000 [IU] | Freq: Every day | SUBCUTANEOUS | Status: DC
  Administered 2023-11-09 – 2023-11-13 (×5): 6 [IU] via SUBCUTANEOUS
  Filled 2023-11-09 (×5): qty 0.06

## 2023-11-09 MED ORDER — SODIUM CHLORIDE 0.9 % IV SOLN
1240.0000 mg | Freq: Once | INTRAVENOUS | Status: AC
  Administered 2023-11-09: 1240 mg via INTRAVENOUS
  Filled 2023-11-09: qty 24.8

## 2023-11-09 MED ORDER — INSULIN ASPART 100 UNIT/ML IJ SOLN
0.0000 [IU] | INTRAMUSCULAR | Status: DC
Start: 2023-11-09 — End: 2023-11-10
  Administered 2023-11-09 (×2): 2 [IU] via SUBCUTANEOUS

## 2023-11-09 MED ORDER — VALPROATE SODIUM 100 MG/ML IV SOLN: 1500.0000 mg | Freq: Once | INTRAVENOUS | Status: DC

## 2023-11-09 MED ORDER — LORAZEPAM 2 MG/ML IJ SOLN
1.0000 mg | Freq: Once | INTRAMUSCULAR | Status: AC
  Administered 2023-11-09: 1 mg via INTRAVENOUS

## 2023-11-09 MED ORDER — LEVETIRACETAM (KEPPRA) 500 MG/5 ML ADULT IV PUSH
1000.0000 mg | Freq: Two times a day (BID) | INTRAVENOUS | Status: DC
  Administered 2023-11-09 – 2023-11-11 (×5): 1000 mg via INTRAVENOUS
  Filled 2023-11-09 (×5): qty 10

## 2023-11-09 MED ORDER — IOHEXOL 350 MG/ML SOLN
75.0000 mL | Freq: Once | INTRAVENOUS | Status: AC | PRN
  Administered 2023-11-09: 75 mL via INTRAVENOUS

## 2023-11-09 MED ORDER — POTASSIUM CHLORIDE 10 MEQ/50ML IV SOLN
10.0000 meq | INTRAVENOUS | Status: AC
  Administered 2023-11-09 (×3): 10 meq via INTRAVENOUS
  Filled 2023-11-09 (×3): qty 50

## 2023-11-09 MED ORDER — ROPINIROLE HCL 0.25 MG PO TABS: 0.2500 mg | ORAL_TABLET | Freq: Three times a day (TID) | ORAL | Status: DC | PRN

## 2023-11-09 NOTE — Progress Notes (Signed)
 NAME:  Michael Bender, MRN:  [id number], DOB:  1945-05-15, LOS: 7 ADMISSION DATE:  11/01/2023, CONSULTATION DATE:  10/8 REFERRING MD:  Daniel MA CHIEF COMPLAINT:  AVR  History of Present Illness:  78 year old male with past medical history of diabetes, hypertension, hyperlipidemia,  history of GIB 2/2 duodenal ulcer 2021, CKD, atrial fibrillation, combined systolic and diastolic CHF, severe aortic insufficiency 2/2 MSSA endocarditis seeded from MSSA bacteremia 2/2 septic arthritis 11/2022 who presented to the emergency department on 11/01/23 with shortness of breath and poor appetite. He was found to be bradycardic in 50s. Felt, despite negative work up, that he would need repeat echo and admission given history of valve infection. Admitted to medicine. Echo 11/02/23 with LVEF 60-65%, G1DD, moderate AVR, calcified aortic root. TCTS consult who planned for [facility], PFT and plan for AVR+LAAL on day of ICU admission. 10/6 had R/LHC with severe AI with wide pulse pressure.   Pump time: 2h 34m Xclamp time: 1h 38m Cell saver: 660cc EBL:  Pertinent  Medical History  diabetes, hypertension, hyperlipidemia,  history of GIB 2/2 duodenal ulcer 2021, CKD, atrial fibrillation, combined systolic and diastolic CHF, severe aortic insufficiency 2/2 MSSA endocarditis seeded from MSSA bacteremia 2/2 septic arthritis 11/2022  Significant Hospital Events: Including procedures, antibiotic start and stop dates in addition to other pertinent events   10/1: admit for fatigue and repeat echo  10/2: severe AI, TCTS consult  10/8: AVR+LAAL   Interim History / Subjective:  Seizure last night after extubation.  He is on precedex for agitation-- kept trying to get OOB.   Objective   Blood pressure (!) 108/59, pulse (!) 164, temperature 97.7 F (36.5 C), resp. rate 20, height 5' 11 (1.803 m), weight 74 kg, SpO2 91%. PAP: (17-21)/(2-14) 21/13 CVP:  [0 mmHg-23 mmHg] 11 mmHg CO:  [3.3 L/min-7.2 L/min] 4.8  L/min CI:  [1.8 L/min/m2-4 L/min/m2] 2.6 L/min/m2  Vent Mode: PSV;CPAP FiO2 (%):  [40 %-50 %] 40 % Set Rate:  [4 bmp-16 bmp] 4 bmp Vt Set:  [600 mL] 600 mL PEEP:  [5 cmH20] 5 cmH20 Pressure Support:  [10 cmH20] 10 cmH20 Plateau Pressure:  [12 cmH20-13 cmH20] 12 cmH20   Intake/Output Summary (Last 24 hours) at 11/09/2023 0714 Last data filed at 11/09/2023 0630 Gross per 24 hour  Intake 7691.6 ml  Output 6381 ml  Net 1310.6 ml   Filed Weights   11/06/23 0448 11/08/23 0944 11/09/23 0500  Weight: 62.1 kg 62.1 kg 74 kg    Examination: General: chronically ill appearing man lying in bed in NAD HENT: temporal wasting, EEG leads in place. Lungs: breathing comfortably on RA, CTAB Cardiovascular: S1S2, RRR, NSR on tele.  Abdomen: soft, NT Extremities: no cyanosis, no significant edema Neuro: lethargic, arouses to stimulation. Confused, moves spontaneously, but not following commands. Onlt says yea when asked questions or given commands. GU: foley with clear yellow urine  BUN 20 Cr 1.71 WBC 8 H/H 10.8/32 Platelets 87   Resolved Hospital Problem list    Assessment & Plan:  Severe aortic insufficiency following MSSA endocarditis in 11/2022, s/p bioprosthetic AVR  LAA ligation  Chronic HFpEF -post-op care per TCTS -pain control per protocol; no tramadol -d/c swan; otherwise keep lines and tubes -metoprolol , aspirin, statin -monitor chest tube output -complete post-op antibiotics -PT, OT to help with advancing mobility with encephalopathy -cap pacing wires today   New onset seizure, acute encephalopathy-- concern this was post-ictal -appreciate Neurology's management -keppra -hold PTA elavil  -cEEG -can reduce precedex as  tolerated -PT, OT, SLP  Expected post-operative blood loss anemia  Expected post operative thrombocytopenia  -neither clinically significant -monitor, no current indication for transfusion  AKI on CKD; continuing to improve today Baseline Cr  1.8-2.2  -keep foley today -Con't monitoring -maintain adequate perfusion  pAF; not on AC PTA. S/p LAA ligation. -tele monitoring -currently in NSR -no AC  Diabetes with controlled hyperglycemia; A1c 8.7 - transition off insulin  gtt to basal bolus-- glargine 6 units daily, SSI  -goal BG <180 -hold PTA jardiance   HTN HLD -con't PTA statin -hold PTA amlodipine , hydralazine   History of migraines  - elavil  50mg  daily  - prn sumatriptain   Unclear if he has history of RLS, do not see diagnosis of Parkinsons  -con't requip    Labs   CBC: Recent Labs  Lab 11/06/23 0442 11/06/23 1159 11/07/23 0442 11/08/23 0606 11/08/23 1145 11/08/23 1515 11/08/23 1544 11/08/23 1800 11/08/23 1838 11/08/23 2203 11/08/23 2331 11/09/23 0414  WBC 6.7  --  6.0 6.6  --   --   --   --  8.8  --   --  8.0  HGB 10.6*   < > 10.8* 10.5*   < > 7.6*   < > 8.8* 9.9* 9.2* 9.9* 10.8*  HCT 31.1*   < > 32.6* 30.8*   < > 22.3*   < > 26.0* 29.2* 27.0* 29.0* 32.0*  MCV 90.9  --  93.1 91.7  --   --   --   --  87.4  --   --  88.2  PLT 125*  --  130* 129*  --  68*  --   --  91*  --   --  87*   < > = values in this interval not displayed.    Basic Metabolic Panel: Recent Labs  Lab 11/02/23 1023 11/03/23 0418 11/05/23 0433 11/06/23 0442 11/06/23 1159 11/07/23 0442 11/08/23 0606 11/08/23 1145 11/08/23 1310 11/08/23 1335 11/08/23 1417 11/08/23 1450 11/08/23 1513 11/08/23 1544 11/08/23 1643 11/08/23 1646 11/08/23 1800 11/08/23 2203 11/08/23 2331 11/09/23 0414  NA 132*   < > 137 139   < > 139 137   < > 140   < > 142   < > 140   < > 142 142 143 142 142 140  K 4.5   < > 4.0 4.1   < > 4.5 3.9   < > 4.3   < > 4.5   < > 5.1   < > 4.2 4.3 3.7 4.0 3.8 3.5  CL 105   < > 104 108  --  109 106   < > 109  --  102  --  105  --  106  --   --   --   --  108  CO2 15*   < > 21* 20*  --  18* 19*  --   --   --   --   --   --   --   --   --   --   --   --  21*  GLUCOSE 147*   < > 132* 123*  --  116* 218*   < > 94   --  149*  --  131*  --  151*  --   --   --   --  136*  BUN 51*   < > 49* 44*  --  34* 35*   < > 29*  --  27*  --  25*  --  24*  --   --   --   --  20  CREATININE 3.07*   < > 2.87* 2.71*  --  2.55* 2.31*   < > 2.20*  --  1.70*  --  1.70*  --  1.70*  --   --   --   --  1.71*  CALCIUM  8.0*   < > 8.5* 8.4*  --  8.5* 8.6*  --   --   --   --   --   --   --   --   --   --   --   --  7.6*  MG 2.8*   < > 2.8* 2.7*  --  2.4 2.4  --   --   --   --   --   --   --   --   --   --   --   --  3.0*  PHOS 4.6  --   --   --   --   --   --   --   --   --   --   --   --   --   --   --   --   --   --   --    < > = values in this interval not displayed.   GFR: Estimated Creatinine Clearance: 37.3 mL/min (A) (by C-G formula based on SCr of 1.71 mg/dL (H)). Recent Labs  Lab 11/07/23 0442 11/08/23 0606 11/08/23 1838 11/09/23 0414  WBC 6.0 6.6 8.8 8.0    Liver Function Tests: Recent Labs  Lab 11/02/23 1023 11/03/23 0418  AST 21 19  ALT 12 11  ALKPHOS 77 70  BILITOT 1.0 0.7  PROT 6.2* 6.3*  ALBUMIN 3.2* 3.2*   Critical care time:      This patient is critically ill with multiple organ system failure which requires frequent high complexity decision making, assessment, support, evaluation, and titration of therapies. This was completed through the application of advanced monitoring technologies and extensive interpretation of multiple databases. During this encounter critical care time was devoted to patient care services described in this note for 37 minutes.  Leita SHAUNNA Gaskins, DO 11/09/23 8:44 AM [facility]  For contact information, see Amion. If no response to pager, please call PCCM consult pager. After hours, 7PM- 7AM, please call Elink.

## 2023-11-09 NOTE — Plan of Care (Signed)

## 2023-11-09 NOTE — Progress Notes (Signed)
 1 Day Post-Op Procedure(s) (LRB): AORTIC VALVE REPLACEMENT USING A INSPIRIS VALVE. (N/A) CLIPPING OF LEFT ATRIAL APPENDAGE USING A CLIP (N/A) ECHOCARDIOGRAM, TRANSESOPHAGEAL, INTRAOPERATIVE (N/A) Subjective: Extubated last night around 10 pm, was neuro intact Around 11:30 pm had seizure activity on the left side Stroke code call, CT/CTA head/neck negative and had another seizure in CT Loaded with Keppra, undergoing EEG On a little dex because he was trying to climb out of bed. Seems strong in all 4 extremities. Creatinine stable, making good urine  Objective: Vital signs in last 24 hours: BP (!) 108/59 (BP Location: Left Arm)   Pulse (!) 214   Temp 98.2 F (36.8 C)   Resp 19   Ht 5' 11 (1.803 m)   Wt 74 kg   SpO2 (!) 86%   BMI 22.75 kg/m  Filed Weights   11/06/23 0448 11/08/23 0944 11/09/23 0500  Weight: 62.1 kg 62.1 kg 74 kg    Hemodynamic parameters for last 24 hours: PAP: (17-21)/(2-14) 21/13 CVP:  [0 mmHg-23 mmHg] 12 mmHg CO:  [3.3 L/min-7.2 L/min] 4.4 L/min CI:  [1.8 L/min/m2-4 L/min/m2] 2.4 L/min/m2  Intake/Output from previous day: 10/08 0701 - 10/09 0700 In: 7691.6 [I.V.:4499.5; Blood:1216; IV Piggyback:1976.1] Out: 6381 [Urine:4755; Blood:1330; Chest Tube:296] Intake/Output this shift: Total I/O In: -  Out: 220 [Urine:200; Chest Tube:20]  Physical Exam: General - Resting in bed, opens eyes to voice, not answering questions CV - RRR, NSR 60-70s Resp - Unlabored on 4LNC Abd - Soft, ND/NT Ext - Mild edema  Lab Results:    Latest Ref Rng & Units 11/09/2023    4:14 AM 11/08/2023   11:31 PM 11/08/2023   10:03 PM  CBC  WBC 4.0 - 10.5 K/uL 8.0     Hemoglobin 13.0 - 17.0 g/dL 89.1  9.9  9.2   Hematocrit 39.0 - 52.0 % 32.0  29.0  27.0   Platelets 150 - 400 K/uL 87         Latest Ref Rng & Units 11/09/2023    4:14 AM 11/08/2023   11:31 PM 11/08/2023   10:03 PM  CMP  Glucose 70 - 99 mg/dL 863     BUN 8 - 23 mg/dL 20     Creatinine 9.38 - 1.24  mg/dL 8.28     Sodium 864 - 854 mmol/L 140  142  142   Potassium 3.5 - 5.1 mmol/L 3.5  3.8  4.0   Chloride 98 - 111 mmol/L 108     CO2 22 - 32 mmol/L 21     Calcium  8.9 - 10.3 mg/dL 7.6       CXR: No effusions, mild edema  Assessment/Plan: S/P Procedure(s) (LRB): AORTIC VALVE REPLACEMENT USING A INSPIRIS VALVE. (N/A) CLIPPING OF LEFT ATRIAL APPENDAGE USING A CLIP (N/A) ECHOCARDIOGRAM, TRANSESOPHAGEAL, INTRAOPERATIVE (N/A) POD1 s/p bioAVR with LAAL. Hemodynamically doing well but with seizures last night - Head CT negative, undergoing EEG NEURO- seizures on evening of POD0, undergoing EEG.  No need for MRI at this time as it would not change management  Pain control PRN Deconditioning, will need PT/OT CV- in SR around 60-70 bpm             Keep pacing wires attached to box but pacer box off  Metoprolol  12.5/12 RESP- Continues improved lung aeration             Continue IS, pulm hygiene  Keep chest tubes today RENAL- creatinine improved and stable; lytes Ok  Hold off on Lasix  for today as diuresing on his own  Foley will stay for today GI- No PO for now given AMS.  If continues, may need dobhoff tube and tube feeds  BM: None yet Endo- BG well controlled Transition to q4 ISS ID- NAI DVT ppx - SCD + no heparin  yet  Dispo: ICU   LOS: 7 days    Con RAMAN Aidenjames Heckmann 11/09/2023

## 2023-11-09 NOTE — Code Documentation (Addendum)
 Stroke Response Nurse Documentation Code Documentation  Michael Bender is a 78 y.o. male admitted to [facility]  on 10/1 for SOB with past medical hx of  AKI on CKD, prior MSSA endocarditis complicated by severe aortic insufficiency, pAfibb, DM2, HTN, HLD who is admitted with decompensated HF and underwent aortic valve replacement today. Code stroke was activated by nursing staff for new onset seizure with Left arm and L leg and L face twitching with L gaze. Had a seizure lasting 1 mins, followed shortly by a second seizure. Depite the seizure beign on the left side, patient is moving his L arm spontaneously with no movement in RUE. HE is post ictal and significantly drowsy.  Patient on 2H unit where he was LKW at 2300.  Stroke team at the bedside after patient activation. Patient to CT with team. NIHSS 23, see documentation for details and code stroke times. Patient with decreased LOC, disoriented, not following commands, right facial droop, bilateral arm weakness, bilateral leg weakness, bilateral decreased sensation, Global aphasia , dysarthria , and right neglect on exam. The following imaging was completed:  CT Head and CTA. Patient is not a candidate for IV Thrombolytic due to recent surgery. Patient is not a candidate for IR due to No LVO.   Care/Plan: Neuro checks q2 hrs.    Bedside handoff with RN Lang.    Griselda Alm ORN  Rapid Response RN

## 2023-11-09 NOTE — Anesthesia Postprocedure Evaluation (Signed)
 Anesthesia Post Note  Patient: [name]  Procedure(s) Performed: AORTIC VALVE REPLACEMENT USING A INSPIRIS VALVE. (Chest) CLIPPING OF LEFT ATRIAL APPENDAGE USING A CLIP (Chest) ECHOCARDIOGRAM, TRANSESOPHAGEAL, INTRAOPERATIVE (Esophagus)     Patient location during evaluation: SICU Anesthesia Type: General Level of consciousness: awake and alert, patient cooperative and confused Pain management: pain level controlled Vital Signs Assessment: post-procedure vital signs reviewed and stable Respiratory status: nonlabored ventilation, spontaneous breathing, respiratory function stable and patient connected to nasal cannula oxygen Cardiovascular status: stable and blood pressure returned to baseline Postop Assessment: no apparent nausea or vomiting Anesthetic complications: no Comments: Pt initially Neuro intact last night, subsequently seized x2, with post ictal confusion. Pt remains unable to communicate well, yet follows commands, moves all extrem. RN reports mental status has continued to improve throughout the day   No notable events documented.  Last Vitals:  Vitals:   11/09/23 1400 11/09/23 1415  BP:    Pulse: 81 83  Resp: (!) 23 (!) 26  Temp:    SpO2: 100% 100%    Last Pain:  Vitals:   11/09/23 1100  TempSrc: Oral  PainSc:                  Miyonna Ormiston,E. Jibran Crookshanks

## 2023-11-09 NOTE — Procedures (Addendum)
 Patient Name: Michael Bender  MRN: [id number]  Epilepsy Attending: Arlin MALVA Krebs  Referring Physician/Provider: Khaliqdina, Salman, MD  Duration: 11/09/2023 0530 to 11/10/2023 0344  Patient history: 78yo M with new onset seizure with Left arm and L leg and L face twitching with L gaze. Had a seizure lasting 1 mins, followed shortly by a second seizure. EEG to evaluate for seizure  Level of alertness: comatose/ lethargic  AEDs during EEG study: LEV, PHT, Ativan  Technical aspects: This EEG study was done with scalp electrodes positioned according to the 10-20 International system of electrode placement. Electrical activity was reviewed with band pass filter of 1-70Hz , sensitivity of 7 uV/mm, display speed of 70mm/sec with a 60Hz  notched filter applied as appropriate. EEG data were recorded continuously and digitally stored.  Video monitoring was available and reviewed as appropriate.  Description: EEG showed continuous generalized and lateralized left hemisphere 3 to 6 Hz theta-delta slowing admixed with 15 to 18 Hz beta activity distributed symmetrically and diffusely. Hyperventilation and photic stimulation were not performed.     Patient pulled electrodes on 11/10/2023 at around 0344 and subsequently the study was not interpretable   ABNORMALITY - Continuous slow, generalized and lateralized left hemisphere   IMPRESSION: This study is suggestive of cortical dysfunction arising from left hemisphere likely secondary to underlying structural abnormality, post ictal state. Additionally there is generalized cerebral dysfunction/ encephalopath likely related to sedation. No seizures or epileptiform discharges were seen throughout the recording.  Amritha Yorke O Tracie Lindbloom

## 2023-11-09 NOTE — Evaluation (Signed)
 Clinical/Bedside Swallow Evaluation Patient Details  Name: Michael Bender MRN: [id number] Date of Birth: [birth year]  Today's Date: 11/09/2023 Time: SLP Start Time (ACUTE ONLY): 1342 SLP Stop Time (ACUTE ONLY): 1354 SLP Time Calculation (min) (ACUTE ONLY): 12 min  Past Medical History:  Past Medical History:  Diagnosis Date   Acute combined systolic and diastolic heart failure (HCC) 04/22/2023   Acute on chronic diastolic CHF (congestive heart failure) (HCC) 04/20/2023   AKI (acute kidney injury) 10/28/2022   Arthritis    Back   Balance problems 02/21/2023   Blood transfusion    as a child   Cachexia 05/03/2022   Added based on temporal wasting noted on exam, Body mass index is 19.92 kg/m. At 05/03/2022 encounter     Cervical discitis 11/10/2022   Chronic back pain    COPD (chronic obstructive pulmonary disease) (HCC)    Disturbance of skin sensation 05/22/2018   Effusion of right knee 02/21/2023   Effusion of right knee joint 01/16/2023   Elevated troponin 04/22/2023   Essential hypertension 03/05/2007   Qualifier: Diagnosis of   By: Krystal MD, Reyes LABOR      Finger pain, left 11/08/2022   Gastric erosion    Gastritis and gastroduodenitis    Gastrointestinal hemorrhage with melena 11/20/2018   GIB (gastrointestinal bleeding) 07/18/2019   Hardware complicating wound infection 11/10/2022   Headache(784.0)    last one 6 months ago   Hemorrhoids, internal    High risk medication use 03/03/2022   History of duodenal ulcer    History of fusion of cervical spine 03/03/2022   With persistent cervical pain and palpable screws  Led to disability  History of attempt to dig furrow in skull to fix it  Last surgery 1992     History of lumbar fusion 03/03/2022   RE-OPERATIVE DIAGNOSIS:  lumbar stenosis synovial cyst lumbar spondylosis spondylolisthesis lumbar radiculoapthy L4/5   PROCEDURE:  Procedure(s): POSTERIOR LUMBAR FUSION 1 LEVEL with resection of synovial cyst     History of  upper gastrointestinal bleeding 03/03/2022   Duodenal ulcer 2021 Dr. Avram   HLD (hyperlipidemia)    Hypertension    Hypokalemia 04/22/2023   Hypomagnesemia 04/22/2023   Infected blister of left index finger 11/07/2022   Intractable pain 03/03/2022   Loss of weight    Wt Readings from Last 10 Encounters:  04/05/22  146 lb (66.2 kg)  03/03/22  143 lb 9.6 oz (65.1 kg)  07/14/21  153 lb (69.4 kg)  12/14/20  147 lb 12.8 oz (67 kg)  11/13/19  154 lb (69.9 kg)  09/16/19  146 lb (66.2 kg)  08/06/19  146 lb (66.2 kg)  07/19/19  144 lb 13.5 oz (65.7 kg)  07/17/19  148 lb (67.1 kg)  07/09/19  147 lb 9.6 oz (67 kg)         MSSA bacteremia 11/07/2022   Neck rigidity    post cervical fusion   Nocturia    PAIN, CHRONIC NEC 10/06/2006   Qualifier: Diagnosis of   By: Krystal RN, Leeroy       Pneumonia    Prostate disease    Right sided temporal headache 05/22/2018   S/P cervical spinal fusion 03/03/2022   With persistent cervical pain and palpable screws Led to disability History of attempt to dig furrow in skull to fix it Last surgery 1992   Senile ecchymosis 01/21/2019   Septic arthritis of wrist, left (HCC) 11/08/2022   Septic infrapatellar bursitis of right knee 11/07/2022  Staphylococcal Arthritis of Right Knee (Updated 01/30/2023) MRI 01/17/2023 shows worsening findings:  Worsening tear of posterior horn medial meniscus with large radial component Worsening subcortical stress fracture/osteochondral lesion of medial femoral condyle New subcortical stress fracture/osteochondral lesion of medial tibial plateau Moderate-to-large effusion with worsened synovitis    Staphylococcal arthritis of left wrist (HCC) 11/07/2022   Staphylococcal arthritis of right knee (HCC) 11/08/2022   Syncope 11/05/2022   Underweight on examination 05/03/2022   Past Surgical History:  Past Surgical History:  Procedure Laterality Date   AORTIC VALVE REPLACEMENT N/A 11/08/2023   Procedure: AORTIC VALVE  REPLACEMENT USING A INSPIRIS VALVE.;  Surgeon: [name], MD;  Location: [facility] OR;  Service: Open Heart Surgery;  Laterality: N/A;   BIOPSY  07/19/2019   Procedure: BIOPSY;  Surgeon: San Sandor GAILS, DO;  Location: [facility];  Service: Gastroenterology;;   CERVICAL FUSION  1992   C2/C 3  four surgeries   CLIPPING OF ATRIAL APPENDAGE N/A 11/08/2023   Procedure: CLIPPING OF LEFT ATRIAL APPENDAGE USING A CLIP;  Surgeon: [name], MD;  Location: [facility] OR;  Service: Open Heart Surgery;  Laterality: N/A;   COLONOSCOPY     ESOPHAGOGASTRODUODENOSCOPY  07/20/2011   Procedure: ESOPHAGOGASTRODUODENOSCOPY (EGD);  Surgeon: Lupita FORBES Commander, MD;  Location: [facility];  Service: Endoscopy;  Laterality: N/A;   ESOPHAGOGASTRODUODENOSCOPY (EGD) WITH PROPOFOL  N/A 07/19/2019   Procedure: ESOPHAGOGASTRODUODENOSCOPY (EGD) WITH PROPOFOL ;  Surgeon: San Sandor GAILS, DO;  Location: [facility];  Service: Gastroenterology;  Laterality: N/A;   I & D EXTREMITY Left 11/09/2022   Procedure: IRRIGATION AND DEBRIDEMENT LEFT WRIST;  Surgeon: Arlinda Buster, MD;  Location: [facility] OR;  Service: Orthopedics;  Laterality: Left;   INTRAOPERATIVE TRANSESOPHAGEAL ECHOCARDIOGRAM N/A 11/08/2023   Procedure: ECHOCARDIOGRAM, TRANSESOPHAGEAL, INTRAOPERATIVE;  Surgeon: [name], MD;  Location: [facility] OR;  Service: Open Heart Surgery;  Laterality: N/A;   RIGHT/LEFT HEART CATH AND CORONARY ANGIOGRAPHY N/A 11/06/2023   Procedure: RIGHT/LEFT HEART CATH AND CORONARY ANGIOGRAPHY;  Surgeon: Wonda Sharper, MD;  Location: [facility] INVASIVE CV LAB;  Service: Cardiovascular;  Laterality: N/A;   SAVORY DILATION  07/20/2011   Procedure: SAVORY DILATION;  Surgeon: Lupita FORBES Commander, MD;  Location: [facility];  Service: Endoscopy;  Laterality: N/A;   SPINAL FUSION  05/06/11   TRANSESOPHAGEAL ECHOCARDIOGRAM (CATH LAB) N/A 07/24/2023   Procedure: TRANSESOPHAGEAL ECHOCARDIOGRAM;  Surgeon: Pietro Redell RAMAN, MD;  Location: [facility] INVASIVE CV LAB;  Service:  Cardiovascular;  Laterality: N/A;   HPI:  78 yo male presenting to ED 10/1 with worsening dyspnea. Echo 10/2 with LVEF 60-65% and moderate AVR, TCTS consulted. Underwent aortic valve replacement 10/8 and remained intubated after the procedure. After being extubated, code stroke called with noted seizure activity. CTH negative. PMH includes DM, HTN, HLD, history of GIB 2/2 duodenal ulcer 2021, CKD, A-fib, combined systolic and diastolic CHF, severe aortic insufficiency 2/2 MSSA endocarditis 2/2 septic arthritis 11/2022    Assessment / Plan / Recommendation  Clinical Impression  RN reports pt is currently following commands but his mentation has fluctuated throughout the day. No focal CN deficits were observed. Post-op clear liquid diet was initiated per MD this AM so assessed with clear liquids only. Oral transit was effective with jello and he achieved complete oral clearance. No signs clinically concerning for aspiration were observed with sips of thin liquids or during the 3 oz water test. Continue current diet for now and SLP will f/u to assess further with solids once able to do so.  Discussed with RN and pt's family. SLP Visit Diagnosis: Dysphagia, unspecified (R13.10)    Aspiration Risk  Mild aspiration risk    Diet Recommendation Other (Comment) (clear liquid diet per MD)    Liquid Administration via: Spoon;Cup;Straw Medication Administration: Whole meds with liquid Supervision: Staff to assist with self feeding;Full supervision/cueing for compensatory strategies Compensations: Minimize environmental distractions;Slow rate;Small sips/bites Postural Changes: Seated upright at 90 degrees    Other  Recommendations Oral Care Recommendations: Oral care BID     Assistance Recommended at Discharge    Functional Status Assessment Patient has had a recent decline in their functional status and demonstrates the ability to make significant improvements in function in a reasonable and predictable  amount of time.  Frequency and Duration min 2x/week  2 weeks       Prognosis Prognosis for improved oropharyngeal function: Good Barriers to Reach Goals: Time post onset      Swallow Study   General HPI: 78 yo male presenting to ED 10/1 with worsening dyspnea. Echo 10/2 with LVEF 60-65% and moderate AVR, TCTS consulted. Underwent aortic valve replacement 10/8 and remained intubated after the procedure. After being extubated, code stroke called with noted seizure activity. CTH negative. PMH includes DM, HTN, HLD, history of GIB 2/2 duodenal ulcer 2021, CKD, A-fib, combined systolic and diastolic CHF, severe aortic insufficiency 2/2 MSSA endocarditis 2/2 septic arthritis 11/2022 Type of Study: Bedside Swallow Evaluation Previous Swallow Assessment: none in chart Diet Prior to this Study: Clear liquid diet Temperature Spikes Noted: No Respiratory Status: Nasal cannula History of Recent Intubation: Yes Total duration of intubation (days):  (<24 hrs) Date extubated: 11/08/23 Behavior/Cognition: Alert;Cooperative Oral Cavity Assessment: Within Functional Limits Oral Care Completed by SLP: No Oral Cavity - Dentition: Adequate natural dentition Vision: Functional for self-feeding Self-Feeding Abilities: Total assist Patient Positioning: Upright in bed Baseline Vocal Quality: Normal Volitional Cough: Strong Volitional Swallow: Able to elicit    Oral/Motor/Sensory Function Overall Oral Motor/Sensory Function: Within functional limits   Ice Chips Ice chips: Not tested   Thin Liquid Thin Liquid: Within functional limits Presentation: Straw    Nectar Thick Nectar Thick Liquid: Not tested   Honey Thick Honey Thick Liquid: Not tested   Puree Puree: Within functional limits Presentation: Spoon   Solid     Solid: Not tested      Damien Blumenthal, M.A., CCC-SLP Speech Language Pathology, Acute Rehabilitation Services  Secure Chat preferred [phone]  11/09/2023,2:54 PM

## 2023-11-09 NOTE — Progress Notes (Addendum)
 Subjective: Has been more awake this morning, attempting to follow commands.  2 sons at bedside.  ROS: Unable to obtain due to poor mental status  Examination  Vital signs in last 24 hours: Temp:  [96.6 F (35.9 C)-98.8 F (37.1 C)] 98.8 F (37.1 C) (10/09 0830) Pulse Rate:  [65-248] 75 (10/09 0945) Resp:  [4-40] 18 (10/09 0945) BP: (101-117)/(53-67) 108/59 (10/09 0000) SpO2:  [67 %-100 %] 97 % (10/09 0945) FiO2 (%):  [40 %-50 %] 40 % (10/08 2135) Weight:  [74 kg] 74 kg (10/09 0500)  General: lying in bed, restless, trying to get out of bed and Neuro: Awake, alert, unable to answer orientation questions, perseverating on his date of birth, did follow simple one-step commands but requires repetition, cranial nerves appear grossly intact with antigravity strength in all 4 extremities  Basic Metabolic Panel: Recent Labs  Lab 11/02/23 1023 11/03/23 0418 11/05/23 0433 11/06/23 0442 11/06/23 1159 11/07/23 0442 11/08/23 0606 11/08/23 1145 11/08/23 1310 11/08/23 1335 11/08/23 1417 11/08/23 1450 11/08/23 1513 11/08/23 1544 11/08/23 1643 11/08/23 1646 11/08/23 1800 11/08/23 2203 11/08/23 2331 11/09/23 0414  NA 132*   < > 137 139   < > 139 137   < > 140   < > 142   < > 140   < > 142 142 143 142 142 140  K 4.5   < > 4.0 4.1   < > 4.5 3.9   < > 4.3   < > 4.5   < > 5.1   < > 4.2 4.3 3.7 4.0 3.8 3.5  CL 105   < > 104 108  --  109 106   < > 109  --  102  --  105  --  106  --   --   --   --  108  CO2 15*   < > 21* 20*  --  18* 19*  --   --   --   --   --   --   --   --   --   --   --   --  21*  GLUCOSE 147*   < > 132* 123*  --  116* 218*   < > 94  --  149*  --  131*  --  151*  --   --   --   --  136*  BUN 51*   < > 49* 44*  --  34* 35*   < > 29*  --  27*  --  25*  --  24*  --   --   --   --  20  CREATININE 3.07*   < > 2.87* 2.71*  --  2.55* 2.31*   < > 2.20*  --  1.70*  --  1.70*  --  1.70*  --   --   --   --  1.71*  CALCIUM  8.0*   < > 8.5* 8.4*  --  8.5* 8.6*  --   --   --   --    --   --   --   --   --   --   --   --  7.6*  MG 2.8*   < > 2.8* 2.7*  --  2.4 2.4  --   --   --   --   --   --   --   --   --   --   --   --  3.0*  PHOS 4.6  --   --   --   --   --   --   --   --   --   --   --   --   --   --   --   --   --   --   --    < > = values in this interval not displayed.    CBC: Recent Labs  Lab 11/06/23 0442 11/06/23 1159 11/07/23 0442 11/08/23 0606 11/08/23 1145 11/08/23 1515 11/08/23 1544 11/08/23 1800 11/08/23 1838 11/08/23 2203 11/08/23 2331 11/09/23 0414  WBC 6.7  --  6.0 6.6  --   --   --   --  8.8  --   --  8.0  HGB 10.6*   < > 10.8* 10.5*   < > 7.6*   < > 8.8* 9.9* 9.2* 9.9* 10.8*  HCT 31.1*   < > 32.6* 30.8*   < > 22.3*   < > 26.0* 29.2* 27.0* 29.0* 32.0*  MCV 90.9  --  93.1 91.7  --   --   --   --  87.4  --   --  88.2  PLT 125*  --  130* 129*  --  68*  --   --  91*  --   --  87*   < > = values in this interval not displayed.     Coagulation Studies: Recent Labs    11/08/23 1838  LABPROT 19.8*  INR 1.6*    Imaging personally reviewed  CT head without contrast 11/09/2023:No acute intracranial abnormality. Remote left basal ganglia lacunar infarct.  CT head and neck with and without contrast 11/09/2023:. No large vessel occlusion. Approximately 40% stenosis of the proximal left ICA due to atherosclerosis. Moderate stenosis at the left vertebral artery origin.   ASSESSMENT AND PLAN: Michael Bender is a 78 y.o. male with hx of AKI on CKD, prior MSSA endocarditis complicated by severe aortic insufficiency, pAfibb, DM2, HTN, HLD who is admitted with decompensated HF and underwent aortic valve replacement today. Extubated post procedure and transferred to CVICU.   A code stroke was activated for noted new onset seizure with Left arm and L leg and L face twitching with L gaze. Had a seizure lasting 1 mins, followed shortly by a second seizure.      New onset seizures - Continue Keppra 1000 mg twice daily.  Cannot increase further due to  low GFR -Avoid Depakote due to thrombocytopenia and avoid Vimpat due to prolonged PR interval - If any further seizures, can add Topamax or clonazepam - No seizures overnight.  Will continue LTM EEG today and discontinue tomorrow if no further seizures - Will discuss with ICU team to obtain MRI brain without contrast after pacing wires are removed, most likely tomorrow - As needed IV Versed  for clinical seizures - Continue seizure precautions - Rest per primary team    I personally spent a total of 38 minutes in the care of the patient today including getting/reviewing separately obtained history, performing a medically appropriate exam/evaluation, counseling and educating, placing orders, referring and communicating with other health care professionals, documenting clinical information in the EHR, independently interpreting results, and coordinating care.          Michael Bender Epilepsy [facility] For questions after 5pm please refer to AMION to reach the Neurologist on call

## 2023-11-09 NOTE — Progress Notes (Signed)
 LTM EEG hooked up and running - no initial skin breakdown - push button tested - [facility] monitoring.

## 2023-11-09 NOTE — TOC Initial Note (Signed)
 Transition of Care [facility]) - Initial/Assessment Note    Patient Details  Name: Michael Bender MRN: [id number] Date of Birth: 01/10/1946  Transition of [facility]) CM/SW Contact:    Sudie Erminio Deems, RN Phone Number: 11/09/2023, 2:01 PM  Clinical Narrative:  Patient presented for shortness of breath. Patient POD-1 AVR. PTA patient was from home alone. Patient has DME rolling walker and walking stick. Patient's son Keneth was in the room during the visit and states he lives about 45 minutes from the patient. Patient's son [name] lives 15 minutes from the patient. Patient also has support of sister that was at the bedside. [facility] is following the patient with TCTS office protocol referral for prearranged [facility] needs. Inpatient Case Manager will continue to follow for additional disposition needs as the patient progresses.                    Expected Discharge Plan: Home w Home Health Services Barriers to Discharge: Continued Medical Work up    Expected Discharge Plan and Services In-house Referral: NA Discharge Planning Services: CM Consult Post Acute Care Choice: Home Health Living arrangements for the past 2 months: Single Family Home                   DME Agency: NA   Prior Living Arrangements/Services Living arrangements for the past 2 months: Single Family Home Lives with:: Self Patient language and need for interpreter reviewed:: Yes Do you feel safe going back to the place where you live?: Yes      Need for Family Participation in Patient Care: No (Comment) Care giver support system in place?: No (comment) Current home services: DME (walking stick and rolling walker) Criminal Activity/Legal Involvement Pertinent to Current Situation/Hospitalization: No - Comment as needed  Activities of Daily Living   ADL Screening (condition at time of admission) Independently performs ADLs?: Yes (appropriate for developmental age) Is the patient deaf or have  difficulty hearing?: Yes Does the patient have difficulty seeing, even when wearing glasses/contacts?: Yes Does the patient have difficulty concentrating, remembering, or making decisions?: No  Permission Sought/Granted Permission sought to share information with : Family Supports, Case Manager                Emotional Assessment Appearance:: Appears stated age Attitude/Demeanor/Rapport: Engaged Affect (typically observed): Appropriate Orientation: : Oriented to Self, Oriented to Place Alcohol / Substance Use: Not Applicable Psych Involvement: No (comment)  Admission diagnosis:  SOB (shortness of breath) [R06.02] Acute kidney injury superimposed on chronic kidney disease [N17.9, N18.9] Severe aortic regurgitation [I35.1] Patient Active Problem List   Diagnosis Date Noted   SOB (shortness of breath) 11/01/2023   Sleep disturbance 10/12/2023   Diabetes mellitus, new onset (HCC) 10/12/2023   Chronic kidney disease, stage 4 (severe) (HCC) 10/12/2023   Restless legs 10/12/2023   Hyperkalemia 10/12/2023   Postlaminectomy syndrome, not elsewhere classified 09/14/2023   Fecal incontinence alternating with constipation 09/14/2023   Elevated BUN 08/14/2023   Severe aortic regurgitation 08/14/2023   Stage 3 chronic kidney disease (HCC) 05/19/2023   Orthostatic hypotension 04/22/2023   Chronic heart failure with preserved ejection fraction (HFpEF, >= 50%) (HCC) 04/20/2023   Balance problems 02/21/2023   Frequent falls 02/17/2023   Nonintractable headache 02/17/2023   Memory loss 01/19/2023   Neck pain 11/08/2022   Acute kidney injury superimposed on chronic kidney disease 10/28/2022   Gynecomastia, male 04/05/2022   High risk medication use 03/03/2022   Opioid dependence (  HCC) 12/14/2020   Atherosclerosis of aorta 12/14/2020   Duodenal stenosis    HLD (hyperlipidemia) 07/18/2019   Iron deficiency anemia    Anemia 07/17/2019   History of CVA (cerebrovascular accident)  02/18/2019   Peripheral arterial disease 12/11/2018   Insomnia 11/20/2018   Esophageal dysphagia 07/15/2011   Bilateral leg pain 05/03/2010   CONSTIPATION, CHRONIC 10/07/2008   GERD 04/04/2007   COPD (chronic obstructive pulmonary disease) (HCC) 03/26/2007   Primary hypertension 03/05/2007   Chronic bilateral low back pain with bilateral sciatica 10/06/2006   PCP:  [name], MD Pharmacy:   [facility] [phone] - [place], [place] - [address] [place] [address] Phone: [phone] Fax: [phone]     Social Drivers of Health (SDOH) Social History: SDOH Screenings   Food Insecurity: No Food Insecurity (11/01/2023)  Housing: Low Risk  (11/01/2023)  Transportation Needs: No Transportation Needs (11/01/2023)  Utilities: Not At Risk (11/01/2023)  Alcohol Screen: Low Risk  (04/24/2023)  Depression (PHQ2-9): Low Risk  (04/19/2023)  Financial Resource Strain: Low Risk  (04/24/2023)  Physical Activity: Insufficiently Active (10/13/2022)  Social Connections: Socially Isolated (11/01/2023)  Stress: No Stress Concern Present (10/13/2022)  Tobacco Use: Medium Risk (11/08/2023)  Health Literacy: Inadequate Health Literacy (10/13/2022)   SDOH Interventions:     Readmission Risk Interventions    11/03/2023    4:23 PM 04/21/2023    4:14 PM 11/14/2022   11:34 AM  Readmission Risk Prevention Plan  Transportation Screening Complete Complete Complete  PCP or Specialist Appt within 3-5 Days  Complete Complete  HRI or Home Care Consult  Complete Complete  Social Work Consult for Recovery Care Planning/Counseling  Complete Complete  Palliative Care Screening  Not Applicable Complete  Medication Review Oceanographer) Complete Complete Complete  PCP or Specialist appointment within 3-5 days of discharge Complete    HRI or Home Care Consult Complete    SW Recovery Care/Counseling Consult Complete    Palliative Care Screening  Not Applicable    Skilled Nursing Facility Not Applicable

## 2023-11-10 ENCOUNTER — Inpatient Hospital Stay (HOSPITAL_COMMUNITY)

## 2023-11-10 ENCOUNTER — Encounter (HOSPITAL_COMMUNITY): Payer: Self-pay | Admitting: Anesthesiology

## 2023-11-10 DIAGNOSIS — R569 Unspecified convulsions: Secondary | ICD-10-CM | POA: Diagnosis not present

## 2023-11-10 LAB — CBC
HCT: 31.7 % — ABNORMAL LOW (ref 39.0–52.0)
Hemoglobin: 10.4 g/dL — ABNORMAL LOW (ref 13.0–17.0)
MCH: 29.4 pg (ref 26.0–34.0)
MCHC: 32.8 g/dL (ref 30.0–36.0)
MCV: 89.5 fL (ref 80.0–100.0)
Platelets: 75 K/uL — ABNORMAL LOW (ref 150–400)
RBC: 3.54 MIL/uL — ABNORMAL LOW (ref 4.22–5.81)
RDW: 17.3 % — ABNORMAL HIGH (ref 11.5–15.5)
WBC: 9.6 K/uL (ref 4.0–10.5)
nRBC: 0 % (ref 0.0–0.2)

## 2023-11-10 LAB — BASIC METABOLIC PANEL WITH GFR
Anion gap: 11 (ref 5–15)
BUN: 15 mg/dL (ref 8–23)
CO2: 21 mmol/L — ABNORMAL LOW (ref 22–32)
Calcium: 7.7 mg/dL — ABNORMAL LOW (ref 8.9–10.3)
Chloride: 107 mmol/L (ref 98–111)
Creatinine, Ser: 1.71 mg/dL — ABNORMAL HIGH (ref 0.61–1.24)
GFR, Estimated: 40 mL/min — ABNORMAL LOW (ref >60)
Glucose, Bld: 97 mg/dL (ref 70–99)
Potassium: 3.6 mmol/L (ref 3.5–5.1)
Sodium: 139 mmol/L (ref 135–145)

## 2023-11-10 LAB — GLUCOSE, CAPILLARY
Glucose-Capillary: 109 mg/dL — ABNORMAL HIGH (ref 70–99)
Glucose-Capillary: 124 mg/dL — ABNORMAL HIGH (ref 70–99)
Glucose-Capillary: 127 mg/dL — ABNORMAL HIGH (ref 70–99)
Glucose-Capillary: 131 mg/dL — ABNORMAL HIGH (ref 70–99)
Glucose-Capillary: 194 mg/dL — ABNORMAL HIGH (ref 70–99)
Glucose-Capillary: 245 mg/dL — ABNORMAL HIGH (ref 70–99)
Glucose-Capillary: 98 mg/dL (ref 70–99)

## 2023-11-10 MED ORDER — ASPIRIN 81 MG PO TBEC
81.0000 mg | DELAYED_RELEASE_TABLET | Freq: Every day | ORAL | Status: DC
  Administered 2023-11-10 – 2023-11-15 (×6): 81 mg via ORAL
  Filled 2023-11-10 (×6): qty 1

## 2023-11-10 MED ORDER — HEPARIN SODIUM (PORCINE) 5000 UNIT/ML IJ SOLN
5000.0000 [IU] | Freq: Three times a day (TID) | INTRAMUSCULAR | Status: DC
  Administered 2023-11-10 – 2023-11-15 (×16): 5000 [IU] via SUBCUTANEOUS
  Filled 2023-11-10 (×16): qty 1

## 2023-11-10 MED ORDER — POTASSIUM CHLORIDE ER 10 MEQ PO TBCR: 40.0000 meq | EXTENDED_RELEASE_TABLET | Freq: Once | ORAL | Status: DC

## 2023-11-10 MED ORDER — AMIODARONE HCL IN DEXTROSE 360-4.14 MG/200ML-% IV SOLN
60.0000 mg/h | INTRAVENOUS | Status: AC
  Administered 2023-11-10: 60 mg/h via INTRAVENOUS
  Filled 2023-11-10: qty 200

## 2023-11-10 MED ORDER — ASPIRIN 81 MG PO CHEW: 81.0000 mg | CHEWABLE_TABLET | Freq: Every day | ORAL | Status: DC

## 2023-11-10 MED ORDER — METOPROLOL TARTRATE 25 MG/10 ML ORAL SUSPENSION: 25.0000 mg | Freq: Two times a day (BID) | ORAL | Status: DC

## 2023-11-10 MED ORDER — INSULIN ASPART 100 UNIT/ML IJ SOLN
0.0000 [IU] | Freq: Three times a day (TID) | INTRAMUSCULAR | Status: DC
  Administered 2023-11-10 – 2023-11-11 (×2): 3 [IU] via SUBCUTANEOUS
  Administered 2023-11-11: 8 [IU] via SUBCUTANEOUS
  Administered 2023-11-12: 5 [IU] via SUBCUTANEOUS
  Administered 2023-11-12: 2 [IU] via SUBCUTANEOUS
  Administered 2023-11-13 – 2023-11-14 (×3): 3 [IU] via SUBCUTANEOUS
  Administered 2023-11-14 – 2023-11-15 (×2): 2 [IU] via SUBCUTANEOUS
  Administered 2023-11-15: 5 [IU] via SUBCUTANEOUS

## 2023-11-10 MED ORDER — CHLORHEXIDINE GLUCONATE CLOTH 2 % EX PADS
6.0000 | MEDICATED_PAD | Freq: Every day | CUTANEOUS | Status: DC
  Administered 2023-11-10 – 2023-11-15 (×6): 6 via TOPICAL

## 2023-11-10 MED ORDER — METOPROLOL TARTRATE 25 MG PO TABS
25.0000 mg | ORAL_TABLET | Freq: Two times a day (BID) | ORAL | Status: DC
  Administered 2023-11-10 – 2023-11-14 (×8): 25 mg via ORAL
  Filled 2023-11-10 (×8): qty 1

## 2023-11-10 MED ORDER — POTASSIUM CHLORIDE CRYS ER 20 MEQ PO TBCR
20.0000 meq | EXTENDED_RELEASE_TABLET | ORAL | Status: AC
  Administered 2023-11-10 (×3): 20 meq via ORAL
  Filled 2023-11-10: qty 1
  Filled 2023-11-10 (×2): qty 2

## 2023-11-10 MED ORDER — AMIODARONE LOAD VIA INFUSION
150.0000 mg | Freq: Once | INTRAVENOUS | Status: AC
  Administered 2023-11-10: 150 mg via INTRAVENOUS
  Filled 2023-11-10: qty 83.34

## 2023-11-10 MED ORDER — AMIODARONE HCL IN DEXTROSE 360-4.14 MG/200ML-% IV SOLN
60.0000 mg/h | INTRAVENOUS | Status: DC
Start: 2023-11-10 — End: 2023-11-13
  Administered 2023-11-11 (×2): 60 mg/h via INTRAVENOUS
  Administered 2023-11-11 (×2): 30 mg/h via INTRAVENOUS
  Administered 2023-11-11: 60 mg/h via INTRAVENOUS
  Administered 2023-11-12: 30 mg/h via INTRAVENOUS
  Administered 2023-11-12: 60 mg/h via INTRAVENOUS
  Administered 2023-11-13: 30 mg/h via INTRAVENOUS
  Filled 2023-11-10 (×8): qty 200

## 2023-11-10 MED ORDER — FUROSEMIDE 10 MG/ML IJ SOLN
40.0000 mg | Freq: Two times a day (BID) | INTRAMUSCULAR | Status: AC
  Administered 2023-11-10 (×2): 40 mg via INTRAVENOUS
  Filled 2023-11-10 (×2): qty 4

## 2023-11-10 MED ORDER — INSULIN ASPART 100 UNIT/ML IJ SOLN
0.0000 [IU] | Freq: Three times a day (TID) | INTRAMUSCULAR | Status: DC
  Administered 2023-11-10: 8 [IU] via SUBCUTANEOUS

## 2023-11-10 MED ORDER — INSULIN ASPART 100 UNIT/ML IJ SOLN: 0.0000 [IU] | Freq: Every day | INTRAMUSCULAR | Status: DC

## 2023-11-10 MED ORDER — ADULT MULTIVITAMIN W/MINERALS CH
1.0000 | ORAL_TABLET | Freq: Every day | ORAL | Status: DC
  Administered 2023-11-11 – 2023-11-15 (×5): 1 via ORAL
  Filled 2023-11-10 (×5): qty 1

## 2023-11-10 MED FILL — Sodium Chloride IV Soln 0.9%: INTRAVENOUS | Qty: 2000 | Status: AC

## 2023-11-10 MED FILL — Heparin Sodium (Porcine) Inj 1000 Unit/ML: INTRAMUSCULAR | Qty: 20 | Status: AC

## 2023-11-10 MED FILL — Sodium Bicarbonate IV Soln 8.4%: INTRAVENOUS | Qty: 100 | Status: AC

## 2023-11-10 MED FILL — Mannitol IV Soln 20%: INTRAVENOUS | Qty: 500 | Status: AC

## 2023-11-10 MED FILL — Electrolyte-R (PH 7.4) Solution: INTRAVENOUS | Qty: 4000 | Status: AC

## 2023-11-10 NOTE — Progress Notes (Signed)

## 2023-11-10 NOTE — Evaluation (Signed)
 Physical Therapy Evaluation Patient Details Name: Michael Bender MRN: [id number] DOB: 02-23-1945 Today's Date: 11/10/2023  History of Present Illness  78 yo male s/p AVR with clipping of atrial appendage 10/8. New onset seizure with  Left arm and L leg and L face twitching with L gaze post op.  PMH: peripheral vascular disease, hypertension, hyperlipidemia, history of orthostatic hypotension, GERD, multiple falls, traumatic rhabdomyolysis, AKI, acute urinary retention with bladder outlet obstruction, BPH.  Clinical Impression  Pt is currently presenting at Min A +2 for bed mobility, sit to stand and gait with RW. Pt has significant difficulty with precautions and requires frequent safety cues. Pt has family that can assist on discharge. Due to pt current functional status, home set up and available assistance at home recommending skilled physical therapy services > 3 hours/day in order to address strength, balance and functional mobility to decrease risk for falls, injury, immobility, skin break down and re-hospitalization.        If plan is discharge home, recommend the following: Assist for transportation;Assistance with cooking/housework     Equipment Recommendations None recommended by PT  Recommendations for Other Services  Rehab consult    Functional Status Assessment Patient has had a recent decline in their functional status and demonstrates the ability to make significant improvements in function in a reasonable and predictable amount of time.     Precautions / Restrictions Precautions Precautions: Fall;Other (comment) (chest tube/seizure) Recall of Precautions/Restrictions: Impaired Restrictions Weight Bearing Restrictions Per Provider Order: Yes RUE Weight Bearing Per Provider Order: Non weight bearing LUE Weight Bearing Per Provider Order: Non weight bearing Other Position/Activity Restrictions: sternal      Mobility  Bed Mobility Overal bed mobility: Needs  Assistance Bed Mobility: Supine to Sit     Supine to sit: Min assist, +2 for safety/equipment          Transfers Overall transfer level: Needs assistance Equipment used: Rolling walker (2 wheels) Transfers: Sit to/from Stand Sit to Stand: +2 safety/equipment, Min assist      Ambulation/Gait Ambulation/Gait assistance: Min assist, +2 physical assistance, +2 safety/equipment Gait Distance (Feet): 500 Feet Assistive device: Rolling walker (2 wheels) Gait Pattern/deviations: Step-through pattern, Narrow base of support Gait velocity: decreased Gait velocity interpretation: <1.8 ft/sec, indicate of risk for recurrent falls   General Gait Details: Pt is impulsive and requires verbal cues for breathing in order to maintain O2 sats over 90%, 2 person assist for safety and assist with lines.     Balance Overall balance assessment: Needs assistance   Sitting balance-Leahy Scale: Fair     Standing balance support: Bilateral upper extremity supported, During functional activity, Reliant on assistive device for balance Standing balance-Leahy Scale: Poor Standing balance comment: intermittent External support         Pertinent Vitals/Pain Pain Assessment Pain Assessment: No/denies pain Pain Intervention(s): Monitored during session    Home Living Family/patient expects to be discharged to:: Private residence Living Arrangements: Alone Available Help at Discharge: Family;Available PRN/intermittently (sons) Type of Home: House Home Access: Level entry       Home Layout: One level Home Equipment: Grab bars - tub/shower;Grab bars - toilet;Hand held shower head;Other (comment);Rolling Walker (2 wheels);Cane - single point;BSC/3in1;Shower seat (walking stick)      Prior Function Prior Level of Function : Independent/Modified Independent;Needs assist             Mobility Comments: Ambulates using a walking stick. Denies falls in the last 18mo. ADLs Comments: Mod I to ind  mostly., Sons help with finances and errnads/driving, pt manages own medications     Extremity/Trunk Assessment   Upper Extremity Assessment Upper Extremity Assessment: Defer to OT evaluation    Lower Extremity Assessment Lower Extremity Assessment: Generalized weakness    Cervical / Trunk Assessment Cervical / Trunk Assessment: Normal  Communication   Communication Communication: No apparent difficulties    Cognition Arousal: Alert Behavior During Therapy: Impulsive   PT - Cognitive impairments: No family/caregiver present to determine baseline, Awareness, Problem solving, Safety/Judgement, Attention, Initiation, Orientation   Orientation impairments: Time     Following commands: Impaired Following commands impaired: Only follows one step commands consistently     Cueing Cueing Techniques: Verbal cues, Tactile cues     General Comments General comments (skin integrity, edema, etc.): Pt with O2 slats in the mid 80's during gait; improves signicantly with cues for breathing through the nose.        Assessment/Plan    PT Assessment Patient needs continued PT services  PT Problem List Decreased strength;Decreased activity tolerance;Decreased balance;Decreased mobility;Decreased safety awareness       PT Treatment Interventions DME instruction;Balance training;Gait training;Stair training;Functional mobility training;Therapeutic activities;Therapeutic exercise;Patient/family education    PT Goals (Current goals can be found in the Care Plan section)  Acute Rehab PT Goals Patient Stated Goal: to go home PT Goal Formulation: With patient Time For Goal Achievement: 11/24/23 Potential to Achieve Goals: Good    Frequency Min 3X/week     Co-evaluation PT/OT/SLP Co-Evaluation/Treatment: Yes Reason for Co-Treatment: For patient/therapist safety;To address functional/ADL transfers PT goals addressed during session: Mobility/safety with mobility;Balance;Proper use of  DME OT goals addressed during session: ADL's and self-care;Proper use of Adaptive equipment and DME       AM-PAC PT 6 Clicks Mobility  Outcome Measure Help needed turning from your back to your side while in a flat bed without using bedrails?: A Little Help needed moving from lying on your back to sitting on the side of a flat bed without using bedrails?: A Little Help needed moving to and from a bed to a chair (including a wheelchair)?: A Little Help needed standing up from a chair using your arms (e.g., wheelchair or bedside chair)?: A Lot Help needed to walk in hospital room?: A Lot Help needed climbing 3-5 steps with a railing? : Total 6 Click Score: 14    End of Session Equipment Utilized During Treatment: Gait belt;Oxygen Activity Tolerance: Patient tolerated treatment well Patient left: in bed;with call bell/phone within reach;with bed alarm set Nurse Communication: Mobility status PT Visit Diagnosis: Unsteadiness on feet (R26.81);Other abnormalities of gait and mobility (R26.89)    Time: 8554-8491 PT Time Calculation (min) (ACUTE ONLY): 23 min   Charges:   PT Evaluation $PT Eval Low Complexity: 1 Low   PT General Charges $$ ACUTE PT VISIT: 1 Visit         Dorothyann Maier, DPT, CLT  Acute Rehabilitation Services Office: [phone] (Secure chat preferred)   Dorothyann VEAR Maier 11/10/2023, 4:30 PM

## 2023-11-10 NOTE — Progress Notes (Signed)
 SLP Cancellation Note  Patient Details Name: Michael Bender MRN: [id number] DOB: November 14, 1945   Cancelled treatment:       Reason Eval/Treat Not Completed: Patient at procedure or test/unavailable (off unit for MRI). SLP will f/u as able to assess swallowing now that diet has been advanced to regular per MD.     Damien Blumenthal, M.A., CCC-SLP Speech Language Pathology, Acute Rehabilitation Services  Secure Chat preferred [phone]  11/10/2023, 1:42 PM

## 2023-11-10 NOTE — Evaluation (Signed)
 Occupational Therapy Evaluation Patient Details Name: Michael Bender MRN: [id number] DOB: December 03, 1945 Today's Date: 11/10/2023   History of Present Illness   78 yo male s/p AVR with clipping of atrial appendage 10/8. New onset seizure with  Left arm and L leg and L face twitching with L gaze post op.  PMH: peripheral vascular disease, hypertension, hyperlipidemia, history of orthostatic hypotension, GERD, multiple falls, traumatic rhabdomyolysis, AKI, acute urinary retention with bladder outlet obstruction, BPH.     Clinical Impressions PTA lives independently using a walking stick and states his sons assist with finances and errands but states he manages his own medications. Pt able to ambulate around the unit with +2 min A @ RW level on 2L with VSS and requires mod A with ADL tasks due to below listed deficits. Pt impulsive at times during session and demonstrates apparent cognitive deficits regarding decreased executive level skills. At this time recommend intensive inpatient follow-up therapy, >3 hours/day to maximize functional level of independence. Acute OT to follow to facilitate DC to next venue of care.      If plan is discharge home, recommend the following:   A little help with walking and/or transfers;A little help with bathing/dressing/bathroom;Direct supervision/assist for medications management;Direct supervision/assist for financial management;Assist for transportation     Functional Status Assessment   Patient has had a recent decline in their functional status and demonstrates the ability to make significant improvements in function in a reasonable and predictable amount of time.     Equipment Recommendations   None recommended by OT     Recommendations for Other Services   Rehab consult     Precautions/Restrictions   Precautions Precautions: Fall;Other (comment) (chest tube; seizure) Recall of Precautions/Restrictions: Impaired Restrictions Weight  Bearing Restrictions Per Provider Order: Yes Other Position/Activity Restrictions: sternal     Mobility Bed Mobility Overal bed mobility: Needs Assistance Bed Mobility: Supine to Sit     Supine to sit: Min assist, +2 for safety/equipment          Transfers Overall transfer level: Needs assistance Equipment used: Rolling walker (2 wheels) Transfers: Sit to/from Stand Sit to Stand: +2 safety/equipment, Min assist                  Balance Overall balance assessment: Needs assistance   Sitting balance-Leahy Scale: Fair       Standing balance-Leahy Scale: Poor                             ADL either performed or assessed with clinical judgement   ADL Overall ADL's : Needs assistance/impaired Eating/Feeding: Set up;Sitting   Grooming: Set up;Supervision/safety;Sitting   Upper Body Bathing: Set up;Supervision/ safety;Sitting   Lower Body Bathing: Moderate assistance;Sit to/from stand   Upper Body Dressing : Moderate assistance;Sitting   Lower Body Dressing: Minimal assistance;Sit to/from stand   Toilet Transfer: Minimal assistance;Ambulation   Toileting- Clothing Manipulation and Hygiene: Minimal assistance       Functional mobility during ADLs: Minimal assistance;+2 for safety/equipment;Rolling walker (2 wheels)       Vision Baseline Vision/History: 1 Wears glasses Vision Assessment?: Wears glasses for reading     Perception         Praxis         Pertinent Vitals/Pain Pain Assessment Pain Assessment: No/denies pain     Extremity/Trunk Assessment Upper Extremity Assessment Upper Extremity Assessment: Overall WFL for tasks assessed   Lower Extremity Assessment Lower Extremity  Assessment: Defer to PT evaluation   Cervical / Trunk Assessment Cervical / Trunk Assessment: Normal   Communication Communication Communication: No apparent difficulties   Cognition Arousal: Alert Behavior During Therapy: Impulsive; after  ambulating back to room, pt began to shove his RW away from him, which had his Chest tube canister attached; pt redirected and sat on bed then began to pull at tubes. Able to redirect pt and tubes maintained in place Cognition: Cognition impaired, No family/caregiver present to determine baseline   Orientation impairments: Time Awareness: Online awareness impaired, Intellectual awareness intact Memory impairment (select all impairments): Working [employer] fast streamer, Conservation officer, historic buildings, Short-term memory Attention impairment (select first level of impairment): Selective attention Executive functioning impairment (select all impairments): Organization, Reasoning, Problem solving OT - Cognition Comments: scored an 8 on the SBT indicating cognitive impairment                 Following commands: Impaired Following commands impaired: Only follows one step commands consistently     Cueing  General Comments   Cueing Techniques: Verbal cues;Tactile cues - multiple cues to maintain sternal precautions   Has a dog named Buddy; has 2 sons but unsure if they can assist at DC   Exercises     Shoulder Instructions      Home Living Family/patient expects to be discharged to:: Private residence Living Arrangements: Alone Available Help at Discharge: Family;Available PRN/intermittently (sons) Type of Home: House Home Access: Level entry     Home Layout: One level     Bathroom Shower/Tub: Chief Strategy Officer: Standard Bathroom Accessibility: Yes How Accessible: Accessible via walker Home Equipment: Grab bars - tub/shower;Grab bars - toilet;Hand held shower head;Other (comment);Rolling Walker (2 wheels);Cane - single point;BSC/3in1;Shower seat (walking stick)          Prior Functioning/Environment Prior Level of Function : Independent/Modified Independent;Needs assist             Mobility Comments: Ambulates using a walking stick. Denies falls in the last  31mo. ADLs Comments: Mod I to ind mostly., Sons help with finances and errnads/driving, pt manages own medications    OT Problem List: Decreased strength;Decreased activity tolerance;Impaired balance (sitting and/or standing);Decreased cognition;Decreased safety awareness;Cardiopulmonary status limiting activity;Decreased knowledge of precautions   OT Treatment/Interventions: Self-care/ADL training;Therapeutic exercise;Energy conservation;DME and/or AE instruction;Therapeutic activities;Cognitive remediation/compensation;Patient/family education;Balance training      OT Goals(Current goals can be found in the care plan section)   Acute Rehab OT Goals Patient Stated Goal: home OT Goal Formulation: With patient Time For Goal Achievement: 11/24/23 Potential to Achieve Goals: Good   OT Frequency:  Min 2X/week    Co-evaluation PT/OT/SLP Co-Evaluation/Treatment: Yes Reason for Co-Treatment: For patient/therapist safety;To address functional/ADL transfers   OT goals addressed during session: ADL's and self-care;Proper use of Adaptive equipment and DME      AM-PAC OT 6 Clicks Daily Activity     Outcome Measure Help from another person eating meals?: A Little Help from another person taking care of personal grooming?: A Little Help from another person toileting, which includes using toliet, bedpan, or urinal?: A Little Help from another person bathing (including washing, rinsing, drying)?: A Lot Help from another person to put on and taking off regular upper body clothing?: A Lot Help from another person to put on and taking off regular lower body clothing?: A Little 6 Click Score: 16   End of Session Equipment Utilized During Treatment: Gait belt;Rolling walker (2 wheels);Oxygen (2L) Nurse Communication: Mobility status  Activity  Tolerance: Patient tolerated treatment well Patient left: in bed;with call bell/phone within reach;with bed alarm set  OT Visit Diagnosis: Unsteadiness  on feet (R26.81);Other abnormalities of gait and mobility (R26.89);Muscle weakness (generalized) (M62.81);Other symptoms and signs involving cognitive function                Time: 8556-8487 OT Time Calculation (min): 29 min Charges:  OT General Charges $OT Visit: 1 Visit OT Evaluation $OT Eval Moderate Complexity: 1 Mod  Jvion Turgeon, OT/L   Acute OT Clinical Specialist Acute Rehabilitation Services Pager [phone] Office [phone]   [facility] 11/10/2023, 3:49 PM

## 2023-11-10 NOTE — Progress Notes (Signed)
 vLTM discontinued  No skin breakdown noted at all skin sites  [facility] notified

## 2023-11-10 NOTE — Progress Notes (Signed)
 vLTM maintenance  Patient removed all leads during the night.  Reached out to Neurologist to see if we are re-hooking LTM

## 2023-11-10 NOTE — Progress Notes (Signed)
 NAME:  Michael Bender, MRN:  [id number], DOB:  [birth year], LOS: 8 ADMISSION DATE:  11/01/2023, CONSULTATION DATE:  10/8 REFERRING MD:  Daniel MA CHIEF COMPLAINT:  AVR  History of Present Illness:  78 year old male with past medical history of diabetes, hypertension, hyperlipidemia,  history of GIB 2/2 duodenal ulcer 2021, CKD, atrial fibrillation, combined systolic and diastolic CHF, severe aortic insufficiency 2/2 MSSA endocarditis seeded from MSSA bacteremia 2/2 septic arthritis 11/2022 who presented to the emergency department on 11/01/23 with shortness of breath and poor appetite. He was found to be bradycardic in 50s. Felt, despite negative work up, that he would need repeat echo and admission given history of valve infection. Admitted to medicine. Echo 11/02/23 with LVEF 60-65%, G1DD, moderate AVR, calcified aortic root. TCTS consult who planned for [facility], PFT and plan for AVR+LAAL on day of ICU admission. 10/6 had R/LHC with severe AI with wide pulse pressure.   Pump time: 2h 16m Xclamp time: 1h 92m Cell saver: 660cc EBL:  Pertinent  Medical History  diabetes, hypertension, hyperlipidemia,  history of GIB 2/2 duodenal ulcer 2021, CKD, atrial fibrillation, combined systolic and diastolic CHF, severe aortic insufficiency 2/2 MSSA endocarditis seeded from MSSA bacteremia 2/2 septic arthritis 11/2022  Significant Hospital Events: Including procedures, antibiotic start and stop dates in addition to other pertinent events   10/1: admit for fatigue and repeat echo  10/2: severe AI, TCTS consult  10/8: AVR+LAAL   Interim History / Subjective:  Feeling better today, ate his full plate this morning.  Tmax 100.9-- fever defervesced last night.   Objective   Blood pressure 136/86, pulse 84, temperature 99.7 F (37.6 C), resp. rate (!) 24, height 5' 11 (1.803 m), weight 67.6 kg, SpO2 99%. CVP:  [4 mmHg-12 mmHg] 4 mmHg CO:  [4.3 L/min-4.8 L/min] 4.8 L/min CI:  [2.4 L/min/m2-2.7  L/min/m2] 2.7 L/min/m2      Intake/Output Summary (Last 24 hours) at 11/10/2023 0717 Last data filed at 11/10/2023 0600 Gross per 24 hour  Intake 460.92 ml  Output 2050 ml  Net -1589.08 ml   Filed Weights   11/08/23 0944 11/09/23 0500 11/10/23 0500  Weight: 62.1 kg 74 kg 67.6 kg    Examination: General: chronically ill appearing man sitting up in the recliner in NAD HENT: temporal wasting, EEG leads off.  Lungs: breathing comfortably on [place], reduced basilar breath sounds, no wheezing Cardiovascular: S1S2, RRR Abdomen: soft, NT Extremities: no significant peripheral edema Neuro: awake, alert, moving all extremities. Does not remember yesterday, but more awake and able to answer questions.  GU: foley with clear yellow urine  BUN 15 Cr 1.71 WBC 9.6 H/H 10.4/31.7 Platelets 75 CXR personally reviewed> LLL silhouetted but no air bronchograms, chest tubes in place. RIJ CVC.   Resolved Hospital Problem list    Assessment & Plan:  Severe aortic insufficiency following MSSA endocarditis in 11/2022, s/p bioprosthetic AVR  LAA ligation  Chronic HFpEF -post-op care per TCTS- keep foley & chest tubes today, d/c CVC and wires -pain control per protocol- no tramadol -metoprolol , aspirin, statin -lasix  40mg  BID; BMP this afternoon -advance mobility and diet  New onset seizure, acute encephalopathy-- concern this was post-ictal -appreciate neurology -con't keppra -MRI brain today after wires out -hold PTA elavil  -PT, OT, SLP -no tramadol  Expected post-operative blood loss anemia  Expected post operative thrombocytopenia  -neither clinically significant  -monitor -no need for transfusions -reduce aspirin to low-dose  AKI on CKD; continuing to improve today Baseline Cr 1.8-2.2  -  keep foley while diuresing -strict I/O -renally dose meds, avoid nephrotoxic meds  pAF; not on AC PTA. S/p LAA ligation. -tele monitoring -d/c pacing wires -no need for full AC  Diabetes with  controlled hyperglycemia; A1c 8.7 -transition off insulin  gtt to basal bolus-- con't glargine 6 units daily, SSI TIDAC -goal BG <180 -hold PTA jardiance  until foley is out  HTN HLD -con't PTA statin -increase metoprolol  to 25mg  BID -con't to hold PTA amlodipine , hydralazine   History of migraines  - elavil  on hold - prn sumatriptan  for now> if he had a stroke this will have to be stopped   Unclear if he has history of RLS, do not see diagnosis of Parkinsons  -con't requip    Labs   CBC: Recent Labs  Lab 11/08/23 0606 11/08/23 1145 11/08/23 1515 11/08/23 1544 11/08/23 1838 11/08/23 2203 11/08/23 2331 11/09/23 0414 11/09/23 1600 11/10/23 0457  WBC 6.6  --   --   --  8.8  --   --  8.0 10.8* 9.6  HGB 10.5*   < > 7.6*   < > 9.9* 9.2* 9.9* 10.8* 10.9* 10.4*  HCT 30.8*   < > 22.3*   < > 29.2* 27.0* 29.0* 32.0* 32.4* 31.7*  MCV 91.7  --   --   --  87.4  --   --  88.2 87.8 89.5  PLT 129*  --  68*  --  91*  --   --  87* 87* 75*   < > = values in this interval not displayed.    Basic Metabolic Panel: Recent Labs  Lab 11/06/23 0442 11/06/23 1159 11/07/23 0442 11/08/23 0606 11/08/23 1145 11/08/23 1513 11/08/23 1544 11/08/23 1643 11/08/23 1646 11/08/23 2203 11/08/23 2331 11/09/23 0414 11/09/23 1600 11/10/23 0457  NA 139   < > 139 137   < > 140   < > 142   < > 142 142 140 136 139  K 4.1   < > 4.5 3.9   < > 5.1   < > 4.2   < > 4.0 3.8 3.5 4.1 3.6  CL 108  --  109 106   < > 105  --  106  --   --   --  108 106 107  CO2 20*  --  18* 19*  --   --   --   --   --   --   --  21* 19* 21*  GLUCOSE 123*  --  116* 218*   < > 131*  --  151*  --   --   --  136* 151* 97  BUN 44*  --  34* 35*   < > 25*  --  24*  --   --   --  20 17 15   CREATININE 2.71*  --  2.55* 2.31*   < > 1.70*  --  1.70*  --   --   --  1.71* 1.82* 1.71*  CALCIUM  8.4*  --  8.5* 8.6*  --   --   --   --   --   --   --  7.6* 7.6* 7.7*  MG 2.7*  --  2.4 2.4  --   --   --   --   --   --   --  3.0* 2.5*  --    < > =  values in this interval not displayed.   GFR: Estimated Creatinine Clearance: 34 mL/min (A) (by C-G formula based on SCr of  1.71 mg/dL (H)). Recent Labs  Lab 11/08/23 1838 11/09/23 0414 11/09/23 1600 11/10/23 0457  WBC 8.8 8.0 10.8* 9.6    Critical care time:       Leita SHAUNNA Gaskins, DO 11/10/23 11:43 AM [facility]  For contact information, see Amion. If no response to pager, please call PCCM consult pager. After hours, 7PM- 7AM, please call Elink.

## 2023-11-10 NOTE — Inpatient Diabetes Management (Signed)
 Inpatient Diabetes Program Recommendations  AACE/ADA: New Consensus Statement on Inpatient Glycemic Control (2015)  Target Ranges:  Prepandial:   less than 140 mg/dL      Peak postprandial:   less than 180 mg/dL (1-2 hours)      Critically ill patients:  140 - 180 mg/dL   Lab Results  Component Value Date   GLUCAP 127 (H) 11/10/2023   HGBA1C 8.7 (H) 11/02/2023    Latest Reference Range & Units 11/09/23 07:47 11/09/23 10:55 11/09/23 11:33 11/09/23 16:04 11/09/23 20:08 11/09/23 23:38 11/10/23 00:12 11/10/23 03:22 11/10/23 07:19  Glucose-Capillary 70 - 99 mg/dL 869 (H) 888 (H) 898 (H) 143 (H) Novolog  2 units 128 (H) Novolog  2 units 65 (L) 98 109 (H) 127 (H)  (H): Data is abnormally high (L): Data is abnormally low  Diabetes history: DM2 Outpatient Diabetes medications: Jardiance  10 mg daily, Ozemipc 0.25 mg weekly  Current orders for Inpatient glycemic control: Lantus  6 units daily Novolog  0-24 units qid  Inpatient Diabetes Program Recommendations:   Noted patient had hypoglycemia post Novolog  correction last pm. Please consider: -Decrease Novolog  correction to 0-15 units tid, 0-5 units hs  Thank you, Dagoberto E. Kennard Fildes, RN, MSN, CNS, CDCES  Diabetes Coordinator Inpatient Glycemic Control Team Team Pager [phone] (8am-5pm) 11/10/2023 10:05 AM

## 2023-11-10 NOTE — Progress Notes (Signed)
 2 Days Post-Op Procedure(s) (LRB): AORTIC VALVE REPLACEMENT USING A INSPIRIS VALVE. (N/A) CLIPPING OF LEFT ATRIAL APPENDAGE USING A CLIP (N/A) ECHOCARDIOGRAM, TRANSESOPHAGEAL, INTRAOPERATIVE (N/A) Subjective: Mental status significantly improved today Has been out of bed to chair and was strong Ate almost entire breakfast this morning  Objective: Vital signs in last 24 hours: BP 136/86   Pulse 84   Temp 99.7 F (37.6 C)   Resp (!) 24   Ht 5' 11 (1.803 m)   Wt 67.6 kg   SpO2 99%   BMI 20.80 kg/m  Filed Weights   11/08/23 0944 11/09/23 0500 11/10/23 0500  Weight: 62.1 kg 74 kg 67.6 kg    Hemodynamic parameters for last 24 hours: CVP:  [4 mmHg-12 mmHg] 4 mmHg CO:  [4.3 L/min-4.8 L/min] 4.8 L/min CI:  [2.4 L/min/m2-2.7 L/min/m2] 2.7 L/min/m2  Intake/Output from previous day: 10/09 0701 - 10/10 0700 In: 460.9 [I.V.:140.1; IV Piggyback:320.8] Out: 2050 [Urine:1910; Chest Tube:140] Intake/Output this shift: No intake/output data recorded.  Physical Exam: General - Resting comfortably in chair CV - RRR Resp - Unlabored on 2LNC Abd - Soft, ND/NT Ext - No edema  Lab Results:    Latest Ref Rng & Units 11/10/2023    4:57 AM 11/09/2023    4:00 PM 11/09/2023    4:14 AM  CBC  WBC 4.0 - 10.5 K/uL 9.6  10.8  8.0   Hemoglobin 13.0 - 17.0 g/dL 89.5  89.0  89.1   Hematocrit 39.0 - 52.0 % 31.7  32.4  32.0   Platelets 150 - 400 K/uL 75  87  87       Latest Ref Rng & Units 11/10/2023    4:57 AM 11/09/2023    4:00 PM 11/09/2023    4:14 AM  CMP  Glucose 70 - 99 mg/dL 97  848  863   BUN 8 - 23 mg/dL 15  17  20    Creatinine 0.61 - 1.24 mg/dL 8.28  8.17  8.28   Sodium 135 - 145 mmol/L 139  136  140   Potassium 3.5 - 5.1 mmol/L 3.6  4.1  3.5   Chloride 98 - 111 mmol/L 107  106  108   CO2 22 - 32 mmol/L 21  19  21    Calcium  8.9 - 10.3 mg/dL 7.7  7.6  7.6     CXR: No pleural effusion or pneumothorax  Assessment/Plan: S/P Procedure(s) (LRB): AORTIC VALVE  REPLACEMENT USING A INSPIRIS VALVE. (N/A) CLIPPING OF LEFT ATRIAL APPENDAGE USING A CLIP (N/A) ECHOCARDIOGRAM, TRANSESOPHAGEAL, INTRAOPERATIVE (N/A) POD 2 s/p CABG NEURO- intact, improved mental status  Pain control PRN Deconditioning- PT/OT Brain MRI this morning per neurology CV- in SR around 70-80 bpm             Remove pacing wires  Discontinue central line RESP- Continued improved lung aeration             Continue IS, pulm hygiene, ambulation  Keep Chest tubes RENAL- creatinine improved and lytes Ok  Weight Up 12 lbs             Start 40 IV Lasix  BID  Foley will stay for today GI- tolerating regular diet  BM: No BM yet Endo- BG well controlled Transition to ACHS ID- NAI DVT ppx - SCD + HSQ 5000 TID this afternoon  Dispo: ICU   LOS: 8 days    Con RAMAN Demetrice Combes 11/10/2023

## 2023-11-10 NOTE — Procedures (Deleted)
 Patient Name: Michael Bender  MRN: [id number]  Epilepsy Attending: Arlin MALVA Krebs  Referring Physician/Provider: Khaliqdina, Salman, MD  Duration: 11/10/2023 0530 to 11/10/2023 0344   Patient history: 78yo M with new onset seizure with Left arm and L leg and L face twitching with L gaze. Had a seizure lasting 1 mins, followed shortly by a second seizure. EEG to evaluate for seizure   Level of alertness: awake/ lethargic   AEDs during EEG study: LEV  Technical aspects: This EEG study was done with scalp electrodes positioned according to the 10-20 International system of electrode placement. Electrical activity was reviewed with band pass filter of 1-70Hz , sensitivity of 7 uV/mm, display speed of 48mm/sec with a 60Hz  notched filter applied as appropriate. EEG data were recorded continuously and digitally stored.  Video monitoring was available and reviewed as appropriate.   Description: EEG showed continuous generalized and lateralized left hemisphere 3 to 6 Hz theta-delta slowing admixed with 15 to 18 Hz beta activity distributed symmetrically and diffusely. Hyperventilation and photic stimulation were not performed.     Patient pulled electrodes on 11/10/2023 at around 0344 and subsequently the study was not interpretable   ABNORMALITY - Continuous slow, generalized and lateralized left hemisphere    IMPRESSION: This study is suggestive of cortical dysfunction arising from left hemisphere likely secondary to underlying structural abnormality, post ictal state. Additionally there is generalized cerebral dysfunction/ encephalopathy. No seizures or epileptiform discharges were seen throughout the recording.   Adelyne Marchese O Tandra Rosado

## 2023-11-10 NOTE — Progress Notes (Deleted)
 [facility] ADULT ICU REPLACEMENT PROTOCOL   The patient does apply for the [facility] Adult ICU Electrolyte Replacment Protocol based on the criteria listed below:   1.Exclusion criteria: TCTS, ECMO, Dialysis, and Myasthenia Gravis patients 2. Is GFR >/= 30 ml/min? Yes.    Patient's GFR today is 40 3. Is SCr </= 2? Yes.   Patient's SCr is 1.71 mg/dL 4. Did SCr increase >/= 0.5 in 24 hours? No. 5.Pt's weight >40kg  Yes.   6. Abnormal electrolyte(s): K+ = 3.6  7. Electrolytes replaced per protocol 8.  Call MD STAT for K+ </= 2.5, Phos </= 1, or Mag </= 1 Physician:  Haze, eMD   Michael Bender 11/10/2023 5:49 AM

## 2023-11-10 NOTE — Progress Notes (Signed)
 Patient ID: Michael Bender, male   DOB: 1946/01/22, 78 y.o.   MRN: [id number] TCTS Evening Rounds:  Hemodynamically stable but went into atrial fib with RVR this afternoon. He got a bolus of amio about an hour ago and now on drip.  HR 120's. If he does not slow down in another hour will give another bolus.  He otherwise doing well with good uo, low CT output.  MRI today showed no acute abnormality.

## 2023-11-10 NOTE — Progress Notes (Signed)
 Subjective: No acute events overnight.  Did pull off EEG electrodes.  Sister and 1 son at bedside.  Patient states he feels significantly better.  Family agrees.  ROS: negative except above  Examination  Vital signs in last 24 hours: Temp:  [97.6 F (36.4 C)-100.9 F (38.3 C)] 99.5 F (37.5 C) (10/10 0800) Pulse Rate:  [71-100] 91 (10/10 1000) Resp:  [12-30] 26 (10/10 1000) BP: (71-175)/(53-98) 151/80 (10/10 1000) SpO2:  [81 %-100 %] 100 % (10/10 1000) Arterial Line BP: (136-159)/(48-54) 146/50 (10/09 1600) Weight:  [67.6 kg] 67.6 kg (10/10 0500)  General: lying in bed, NAD Neuro: AO x 3, no aphasia, cranial nerves grossly intact, 5/5 in all 4 extremities, FTN intact bilaterally  Basic Metabolic Panel: Recent Labs  Lab 11/06/23 0442 11/06/23 1159 11/07/23 0442 11/08/23 0606 11/08/23 1145 11/08/23 1513 11/08/23 1544 11/08/23 1643 11/08/23 1646 11/08/23 2203 11/08/23 2331 11/09/23 0414 11/09/23 1600 11/10/23 0457  NA 139   < > 139 137   < > 140   < > 142   < > 142 142 140 136 139  K 4.1   < > 4.5 3.9   < > 5.1   < > 4.2   < > 4.0 3.8 3.5 4.1 3.6  CL 108  --  109 106   < > 105  --  106  --   --   --  108 106 107  CO2 20*  --  18* 19*  --   --   --   --   --   --   --  21* 19* 21*  GLUCOSE 123*  --  116* 218*   < > 131*  --  151*  --   --   --  136* 151* 97  BUN 44*  --  34* 35*   < > 25*  --  24*  --   --   --  20 17 15   CREATININE 2.71*  --  2.55* 2.31*   < > 1.70*  --  1.70*  --   --   --  1.71* 1.82* 1.71*  CALCIUM  8.4*  --  8.5* 8.6*  --   --   --   --   --   --   --  7.6* 7.6* 7.7*  MG 2.7*  --  2.4 2.4  --   --   --   --   --   --   --  3.0* 2.5*  --    < > = values in this interval not displayed.    CBC: Recent Labs  Lab 11/08/23 0606 11/08/23 1145 11/08/23 1515 11/08/23 1544 11/08/23 1838 11/08/23 2203 11/08/23 2331 11/09/23 0414 11/09/23 1600 11/10/23 0457  WBC 6.6  --   --   --  8.8  --   --  8.0 10.8* 9.6  HGB 10.5*   < > 7.6*   < > 9.9* 9.2*  9.9* 10.8* 10.9* 10.4*  HCT 30.8*   < > 22.3*   < > 29.2* 27.0* 29.0* 32.0* 32.4* 31.7*  MCV 91.7  --   --   --  87.4  --   --  88.2 87.8 89.5  PLT 129*  --  68*  --  91*  --   --  87* 87* 75*   < > = values in this interval not displayed.     Coagulation Studies: Recent Labs    11/08/23 1838  LABPROT 19.8*  INR 1.6*  Imaging No new brain imaging overnight   ASSESSMENT AND PLAN: Michael Bender is a 78 y.o. male with hx of AKI on CKD, prior MSSA endocarditis complicated by severe aortic insufficiency, pAfibb, DM2, HTN, HLD who is admitted with decompensated HF and underwent aortic valve replacement today. Extubated post procedure and transferred to CVICU.   A code stroke was activated for noted new onset seizure with Left arm and L leg and L face twitching with L gaze. Had a seizure lasting 1 mins, followed shortly by a second seizure.       New onset seizures - Continue Keppra 1000 mg twice daily.  Cannot increase further due to low GFR -Avoid Depakote due to thrombocytopenia and avoid Vimpat due to prolonged PR interval - If any further seizures, can add Topamax or clonazepam - DC LTM EEG as no further seizures - MRI brain without contrast to look for any acute abnormality - As needed IV Versed  for clinical seizures - Continue seizure precautions - Rest per primary team     I personally spent a total of 35 minutes in the care of the patient today including getting/reviewing separately obtained history, performing a medically appropriate exam/evaluation, counseling and educating, placing orders, referring and communicating with other health care professionals, documenting clinical information in the EHR, independently interpreting results, and coordinating care.          Arlin Krebs Epilepsy [facility] For questions after 5pm please refer to AMION to reach the Neurologist on call

## 2023-11-10 NOTE — Progress Notes (Signed)
 Initial Nutrition Assessment  DOCUMENTATION CODES:   Severe malnutrition in context of chronic illness  INTERVENTION:  Continue Ensure Plus High Protein po BID, each supplement provides 350 kcal and 20 grams of protein  Add Magic cup TID with meals, each supplement provides 290 kcal and 9 grams of protein  Add MVI w/ minerals  No BM x9 days; recommend scheduled bowel regimen   NUTRITION DIAGNOSIS:  Severe Malnutrition related to chronic illness as evidenced by severe fat depletion, severe muscle depletion.  GOAL:  Patient will meet greater than or equal to 90% of their needs  MONITOR:  PO intake, Supplement acceptance, Weight trends, Labs  REASON FOR ASSESSMENT:   Consult Assessment of nutrition requirement/status  ASSESSMENT:   Pt with PMH significant for: HTN, CVA, PAD, AKI on CKD, COPD, HLD, HFmrEF, afib, diabetes, and severe aortic insufficiency 2/2 bacterial endocarditis presented w/ worsening dyspnea. Admitted with decompensated HF and underwent aortic valve replacement.  10/1: admitted 10/2: severe AI, TCTS consult 10/8: AVR + LAAL 10/9: extubated; code stroke found to be seizure  Pt extubated last night around 10PM and code stroke called around 11:30PM and found to have seizure. Loaded with Keppra.   Average Meal Intake No documented intake to review  Awake and alert during bedside visit. Family also at beside. He is requesting to eat lunch tray.  Reportedly ate 100% of breakfast which consisted of bacon, eggs, grits, banana, milk, and coffee. No difficulties chewing or swallowing. Edentulous, but family reports he has dentures that fit well.  Family endorse intake has declined in 1-2 weeks leading up to admission. State he does not adhere to an appropriate diet. Eats mainly ice cream throughout the day and other convenience items that require little prep. State they have made him meals that he will decline to even hit up in the microwave, preferring convenience  items. They have been trying to encourage him to consume more protein. Educated regarding importance of protein intake as it relates to blood sugar control as well as preservation and gaining of lean body mass. They verbalize understanding.    Admit Weight: 62.1 kg Current Weight: 67.6 kg  No edema on exam. Family reports UBW around 145-150lbs. Per chart review, has shown 5% weight loss in three months and 2.5% weight loss in one month. These trends are not considered clinically significant for the time frame reviewed. Has shown some weight fluctuation during admission. Will continue to mointor trend as it is established. Concerning as no BM documented since admission (9 days). Noted with dulcolax suppository ordered yesterday. Will monitor effectiveness. If no BM in next day, would recommend modification of scheduled regimen.   Drains/Lines: Y chest tube (28Fr):  x24 hours Foley catheter UOP: 1910 ml x24 hours  Received IV furosemide  today. Has required potassium supplementation. Creatinine trends stable.  Meds: bisacodyl , docusate sodium , SS Novolog , Lantus , melatonin, pantoprazole   Labs:  Na+ 139 (wdl) K+ 3.6 (wdl) CBGs 97-151 x24 hours A1c 8.7 (11/2023)    NUTRITION - FOCUSED PHYSICAL EXAM: Muscle and fat depletions observed consistent with criteria for severe malnutrition.   Flowsheet Row Most Recent Value  Orbital Region Severe depletion  Upper Arm Region Severe depletion  Thoracic and Lumbar Region Severe depletion  Buccal Region Severe depletion  Temple Region Severe depletion  Clavicle Bone Region Severe depletion  Clavicle and Acromion Bone Region Severe depletion  Scapular Bone Region Severe depletion  Dorsal Hand Moderate depletion  Patellar Region Severe depletion  Anterior Thigh Region Severe depletion  Posterior Calf Region Moderate depletion  Edema (RD Assessment) None  Hair Reviewed  Eyes Reviewed  Mouth Reviewed  Skin Reviewed  Nails Reviewed     Diet Order:   Diet Order             Diet Carb Modified Fluid consistency: Thin; Room service appropriate? Yes  Diet effective now            EDUCATION NEEDS:   Education needs have been addressed  Skin:  Skin Assessment: Skin Integrity Issues: Skin Integrity Issues:: Incisions Incisions: chest  Last BM:  PTA  Height:  Ht Readings from Last 1 Encounters:  11/08/23 5' 11 (1.803 m)   Weight:  Wt Readings from Last 1 Encounters:  11/10/23 67.6 kg    Ideal Body Weight:  78.2 kg  BMI:  Body mass index is 20.8 kg/m.  Estimated Nutritional Needs:   Kcal:  1700-1900 kcals  Protein:  85-100g  Fluid:  1.7-1.9L/day  Blair Deaner MS, RD, LDN Registered Dietitian Clinical Nutrition RD Inpatient Contact Info in Amion

## 2023-11-11 ENCOUNTER — Inpatient Hospital Stay (HOSPITAL_COMMUNITY)

## 2023-11-11 ENCOUNTER — Other Ambulatory Visit: Payer: Self-pay

## 2023-11-11 DIAGNOSIS — I999 Unspecified disorder of circulatory system: Secondary | ICD-10-CM

## 2023-11-11 DIAGNOSIS — G4089 Other seizures: Secondary | ICD-10-CM

## 2023-11-11 DIAGNOSIS — E43 Unspecified severe protein-calorie malnutrition: Secondary | ICD-10-CM | POA: Insufficient documentation

## 2023-11-11 DIAGNOSIS — I700 Atherosclerosis of aorta: Secondary | ICD-10-CM | POA: Diagnosis not present

## 2023-11-11 DIAGNOSIS — R569 Unspecified convulsions: Secondary | ICD-10-CM | POA: Diagnosis not present

## 2023-11-11 DIAGNOSIS — I4891 Unspecified atrial fibrillation: Secondary | ICD-10-CM

## 2023-11-11 DIAGNOSIS — Z452 Encounter for adjustment and management of vascular access device: Secondary | ICD-10-CM | POA: Diagnosis not present

## 2023-11-11 LAB — TYPE AND SCREEN
ABO/RH(D): O POS
Antibody Screen: NEGATIVE
Unit division: 0
Unit division: 0
Unit division: 0
Unit division: 0
Unit division: 0
Unit division: 0

## 2023-11-11 LAB — BPAM RBC
Blood Product Expiration Date: 202511032359
Blood Product Expiration Date: 202511032359
Blood Product Expiration Date: 202511032359
Blood Product Expiration Date: 202511032359
Blood Product Expiration Date: 202511032359
Blood Product Expiration Date: 202511062359
ISSUE DATE / TIME: 202510081113
ISSUE DATE / TIME: 202510081113
ISSUE DATE / TIME: 202510081410
ISSUE DATE / TIME: 202510081410
ISSUE DATE / TIME: 202510081410
ISSUE DATE / TIME: 202510081410
Unit Type and Rh: 5100
Unit Type and Rh: 5100
Unit Type and Rh: 5100
Unit Type and Rh: 5100
Unit Type and Rh: 5100
Unit Type and Rh: 5100

## 2023-11-11 LAB — CBC WITH DIFFERENTIAL/PLATELET
Abs Immature Granulocytes: 0.02 K/uL (ref 0.00–0.07)
Basophils Absolute: 0 K/uL (ref 0.0–0.1)
Basophils Relative: 0 %
Eosinophils Absolute: 0 K/uL (ref 0.0–0.5)
Eosinophils Relative: 0 %
HCT: 38.1 % — ABNORMAL LOW (ref 39.0–52.0)
Hemoglobin: 12.6 g/dL — ABNORMAL LOW (ref 13.0–17.0)
Immature Granulocytes: 0 %
Lymphocytes Relative: 7 %
Lymphs Abs: 0.5 K/uL — ABNORMAL LOW (ref 0.7–4.0)
MCH: 29.2 pg (ref 26.0–34.0)
MCHC: 33.1 g/dL (ref 30.0–36.0)
MCV: 88.2 fL (ref 80.0–100.0)
Monocytes Absolute: 0.5 K/uL (ref 0.1–1.0)
Monocytes Relative: 6 %
Neutro Abs: 7 K/uL (ref 1.7–7.7)
Neutrophils Relative %: 87 %
Platelets: 102 K/uL — ABNORMAL LOW (ref 150–400)
RBC: 4.32 MIL/uL (ref 4.22–5.81)
RDW: 16.6 % — ABNORMAL HIGH (ref 11.5–15.5)
WBC: 8 K/uL (ref 4.0–10.5)
nRBC: 0 % (ref 0.0–0.2)

## 2023-11-11 LAB — GLUCOSE, CAPILLARY
Glucose-Capillary: 133 mg/dL — ABNORMAL HIGH (ref 70–99)
Glucose-Capillary: 112 mg/dL — ABNORMAL HIGH (ref 70–99)
Glucose-Capillary: 195 mg/dL — ABNORMAL HIGH (ref 70–99)
Glucose-Capillary: 273 mg/dL — ABNORMAL HIGH (ref 70–99)
Glucose-Capillary: 184 mg/dL — ABNORMAL HIGH (ref 70–99)

## 2023-11-11 LAB — BASIC METABOLIC PANEL WITH GFR
Anion gap: 15 (ref 5–15)
BUN: 32 mg/dL — ABNORMAL HIGH (ref 8–23)
CO2: 20 mmol/L — ABNORMAL LOW (ref 22–32)
Calcium: 7.5 mg/dL — ABNORMAL LOW (ref 8.9–10.3)
Chloride: 100 mmol/L (ref 98–111)
Creatinine, Ser: 2.1 mg/dL — ABNORMAL HIGH (ref 0.61–1.24)
GFR, Estimated: 32 mL/min — ABNORMAL LOW (ref >60)
Glucose, Bld: 289 mg/dL — ABNORMAL HIGH (ref 70–99)
Potassium: 3.8 mmol/L (ref 3.5–5.1)
Sodium: 135 mmol/L (ref 135–145)

## 2023-11-11 MED ORDER — MAGNESIUM SULFATE 2 GM/50ML IV SOLN
2.0000 g | Freq: Once | INTRAVENOUS | Status: DC
  Filled 2023-11-11: qty 50

## 2023-11-11 MED ORDER — ORAL CARE MOUTH RINSE: 15.0000 mL | OROMUCOSAL | Status: DC | PRN

## 2023-11-11 MED ORDER — AMIODARONE LOAD VIA INFUSION
150.0000 mg | Freq: Once | INTRAVENOUS | Status: AC
  Administered 2023-11-11: 150 mg via INTRAVENOUS
  Filled 2023-11-11: qty 83.34

## 2023-11-11 MED ORDER — LEVETIRACETAM 250 MG PO TABS
1000.0000 mg | ORAL_TABLET | Freq: Two times a day (BID) | ORAL | Status: DC
  Administered 2023-11-11 – 2023-11-13 (×4): 1000 mg via ORAL
  Filled 2023-11-11 (×4): qty 4

## 2023-11-11 MED ORDER — SODIUM CHLORIDE 0.9% FLUSH
10.0000 mL | Freq: Two times a day (BID) | INTRAVENOUS | Status: DC
  Administered 2023-11-11: 20 mL
  Administered 2023-11-12 – 2023-11-13 (×3): 10 mL

## 2023-11-11 MED ORDER — SODIUM CHLORIDE 0.9% FLUSH: 10.0000 mL | INTRAVENOUS | Status: DC | PRN

## 2023-11-11 MED ORDER — MAGNESIUM SULFATE 2 GM/50ML IV SOLN
2.0000 g | Freq: Once | INTRAVENOUS | Status: AC
  Administered 2023-11-11: 2 g via INTRAVENOUS

## 2023-11-11 MED ORDER — HYALURONIDASE HUMAN 150 UNIT/ML IJ SOLN
150.0000 [IU] | Freq: Once | INTRAMUSCULAR | Status: AC
  Administered 2023-11-11: 150 [IU] via SUBCUTANEOUS
  Filled 2023-11-11: qty 1

## 2023-11-11 MED ORDER — PROCHLORPERAZINE EDISYLATE 10 MG/2ML IJ SOLN: 10.0000 mg | Freq: Four times a day (QID) | INTRAMUSCULAR | Status: DC | PRN

## 2023-11-11 NOTE — Progress Notes (Signed)
 NAME:  Michael Bender, MRN:  [id number], DOB:  December 20, 1945, LOS: 9 ADMISSION DATE:  11/01/2023, CONSULTATION DATE:  10/8 REFERRING MD:  Daniel MA CHIEF COMPLAINT:  AVR  History of Present Illness:  78 year old male with past medical history of diabetes, hypertension, hyperlipidemia,  history of GIB 2/2 duodenal ulcer 2021, CKD, atrial fibrillation, combined systolic and diastolic CHF, severe aortic insufficiency 2/2 MSSA endocarditis seeded from MSSA bacteremia 2/2 septic arthritis 11/2022 who presented to the emergency department on 11/01/23 with shortness of breath and poor appetite. He was found to be bradycardic in 50s. Felt, despite negative work up, that he would need repeat echo and admission given history of valve infection. Admitted to medicine. Echo 11/02/23 with LVEF 60-65%, G1DD, moderate AVR, calcified aortic root. TCTS consult who planned for [facility], PFT and plan for AVR+LAAL on day of ICU admission. 10/6 had R/LHC with severe AI with wide pulse pressure.   Pump time: 2h 12m Xclamp time: 1h 72m Cell saver: 660cc EBL:  Pertinent  Medical History  diabetes, hypertension, hyperlipidemia,  history of GIB 2/2 duodenal ulcer 2021, CKD, atrial fibrillation, combined systolic and diastolic CHF, severe aortic insufficiency 2/2 MSSA endocarditis seeded from MSSA bacteremia 2/2 septic arthritis 11/2022  Significant Hospital Events: Including procedures, antibiotic start and stop dates in addition to other pertinent events   10/1: admit for fatigue and repeat echo  10/2: severe AI, TCTS consult  10/8: AVR+LAAL. Seizure post-extubation. 10/10 Afib with RVR, amiodarone started  Interim History / Subjective:  Feeling well today. Ate yesterday, but had nausea int he evening. Had Afib with RVR last ngiht> on amiodarone gtt. Pulling out PIVs.  Objective   Blood pressure (!) 132/103, pulse 98, temperature 98.9 F (37.2 C), temperature source Oral, resp. rate (!) 26, height 5' 11 (1.803  m), weight 63.4 kg, SpO2 96%.        Intake/Output Summary (Last 24 hours) at 11/11/2023 0725 Last data filed at 11/11/2023 0700 Gross per 24 hour  Intake 806.13 ml  Output 4530 ml  Net -3723.87 ml   Filed Weights   11/09/23 0500 11/10/23 0500 11/11/23 0500  Weight: 74 kg 67.6 kg 63.4 kg    Examination: General: chronically ill appearing elderly man sitting up in the chair in NAD HENT: temporal wasting Lungs: breathing comfortably on RA, no wheezing Cardiovascular: S1S2, tachycardic, irreg rhythm Abdomen: soft, NT Extremities: no significant peripheral edema, muscle wasting Neuro: awake, alert, moving all extremities, answering questions appropriately  MRI brain: no acute intracranial abnormality. Moderetely severe chronic small vessel ischemic disease. BUN 15 Cr 1.71 WBC 9.6 H/H 10.4/31.7 Platelets 75   Resolved Hospital Problem list    Assessment & Plan:  Severe aortic insufficiency following MSSA endocarditis in 11/2022, s/p bioprosthetic AVR  LAA ligation  Chronic HFpEF -post-op care per TCTS -pain control per protocol; no tramadol with seizures -aspirin, statin -con't metoprolol  25mg  BID; may be able to increase this to help with Afib if his BP is stable after amiodarone boluses -advance mobility and diet -hold additional diuresis today  New onset seizure, acute encephalopathy-- concern this was post-ictal. Not sure if he has baseline cognitive deficits based on advanced smell vessel disease on MRI. -appreciate neurology's management -con't keppra; not a candidate for vimpat or depakote. Would need to add clonazepam or topamax if having more seizures per Neuro -con't holding PTA elavil  -PT, OT, SLP -CIR referral has been placed; son agrees -no tramadol  Expected post-operative blood loss anemia  Expected post operative  thrombocytopenia  -neither clinically significant  -monitor, no current need for transfusions -low dose aspirin  AKI on CKD; continuing  to improve today Baseline Cr 1.8-2.2  -would like to get foley out -maintain adequate perfusion -strict I/O, monitor daily renal function  pAF with RVR; not on AC PTA. S/p LAA ligation. -bolus with amiodarone again, con't infusion -no need for full AC -Mg+ 2g empirically -PICC since he keeps pulling on IVs, worried about amiodarone infiltrating again  Diabetes with controlled hyperglycemia; A1c 8.7 -con't glargine 6 units daily, SSI TIDAC -goal BG <180 -hold PTA jardiance  until foley is out  HTN HLD -con't PTA statin -con't metoprolol  25mg  BID -holding PTA hydralzine and amlodipine   History of migraines  - con't holding elavil ; long term may be able to switch metoprolol  to propranolol for headache prophylaxis - prn sumatriptan  for now > ok to continue since no stroke  Deconditioning -referral has been made to CIR  Unclear if he has history of RLS, do not see diagnosis of Parkinsons  -con't requip   Son updated at bedside during rounds.    Labs   CBC: Recent Labs  Lab 11/08/23 0606 11/08/23 1145 11/08/23 1515 11/08/23 1544 11/08/23 1838 11/08/23 2203 11/08/23 2331 11/09/23 0414 11/09/23 1600 11/10/23 0457  WBC 6.6  --   --   --  8.8  --   --  8.0 10.8* 9.6  HGB 10.5*   < > 7.6*   < > 9.9* 9.2* 9.9* 10.8* 10.9* 10.4*  HCT 30.8*   < > 22.3*   < > 29.2* 27.0* 29.0* 32.0* 32.4* 31.7*  MCV 91.7  --   --   --  87.4  --   --  88.2 87.8 89.5  PLT 129*  --  68*  --  91*  --   --  87* 87* 75*   < > = values in this interval not displayed.    Basic Metabolic Panel: Recent Labs  Lab 11/06/23 0442 11/06/23 1159 11/07/23 0442 11/08/23 0606 11/08/23 1145 11/08/23 1513 11/08/23 1544 11/08/23 1643 11/08/23 1646 11/08/23 2203 11/08/23 2331 11/09/23 0414 11/09/23 1600 11/10/23 0457  NA 139   < > 139 137   < > 140   < > 142   < > 142 142 140 136 139  K 4.1   < > 4.5 3.9   < > 5.1   < > 4.2   < > 4.0 3.8 3.5 4.1 3.6  CL 108  --  109 106   < > 105  --  106  --    --   --  108 106 107  CO2 20*  --  18* 19*  --   --   --   --   --   --   --  21* 19* 21*  GLUCOSE 123*  --  116* 218*   < > 131*  --  151*  --   --   --  136* 151* 97  BUN 44*  --  34* 35*   < > 25*  --  24*  --   --   --  20 17 15   CREATININE 2.71*  --  2.55* 2.31*   < > 1.70*  --  1.70*  --   --   --  1.71* 1.82* 1.71*  CALCIUM  8.4*  --  8.5* 8.6*  --   --   --   --   --   --   --  7.6*  7.6* 7.7*  MG 2.7*  --  2.4 2.4  --   --   --   --   --   --   --  3.0* 2.5*  --    < > = values in this interval not displayed.   GFR: Estimated Creatinine Clearance: 31.9 mL/min (A) (by C-G formula based on SCr of 1.71 mg/dL (H)). Recent Labs  Lab 11/08/23 1838 11/09/23 0414 11/09/23 1600 11/10/23 0457  WBC 8.8 8.0 10.8* 9.6    Critical care time:       Leita SHAUNNA Gaskins, DO 11/11/23 7:25 AM [facility]  For contact information, see Amion. If no response to pager, please call PCCM consult pager. After hours, 7PM- 7AM, please call Elink.

## 2023-11-11 NOTE — Progress Notes (Signed)
 Pt up in  chair, RN and son at bedside.  RN states pt had a hypotensive event and not feeling well to get back to bed.  Pt states he felt well enough for consent, signed.  Pt feeling better, color improving.  RN to wait a little longer and notify this PICC RN when back to bed.

## 2023-11-11 NOTE — Progress Notes (Signed)
 Subjective: Patient has remained hemodynamically stable and afebrile.  He has had no acute events and is up in the chair today with family at bedside.  ROS: negative except above  Examination  Vital signs in last 24 hours: Temp:  [98.5 F (36.9 C)-98.9 F (37.2 C)] 98.5 F (36.9 C) (10/11 0728) Pulse Rate:  [77-129] 98 (10/11 0700) Resp:  [18-31] 26 (10/11 0700) BP: (93-160)/(51-109) 132/103 (10/11 0700) SpO2:  [73 %-100 %] 96 % (10/11 0700) Weight:  [63.4 kg] 63.4 kg (10/11 0500)  General: Well-nourished, well-developed elderly patient in no acute distress  NEURO:  Mental Status: AA&Ox2 (gives correct month but wrong year) Speech/Language: speech is without dysarthria or aphasia.    Cranial Nerves:  II: PERRL. Visual fields full.  III, IV, VI: EOMI. Eyelids elevate symmetrically.  V: Sensation is intact to light touch and symmetrical to face.  VII: Smile is symmetrical.  VIII: hearing intact to voice. IX, X: Phonation is normal.  KP:Dynloizm shrug 5/5. XII: tongue is midline without fasciculations. Motor: 5/5 strength to all muscle groups tested.  Tone: is normal and bulk is normal Sensation- Intact to light touch bilaterally.  Coordination: FTN intact bilaterally Gait- deferred   Basic Metabolic Panel: Recent Labs  Lab 11/06/23 0442 11/06/23 1159 11/07/23 0442 11/08/23 0606 11/08/23 1145 11/08/23 1513 11/08/23 1544 11/08/23 1643 11/08/23 1646 11/08/23 2203 11/08/23 2331 11/09/23 0414 11/09/23 1600 11/10/23 0457  NA 139   < > 139 137   < > 140   < > 142   < > 142 142 140 136 139  K 4.1   < > 4.5 3.9   < > 5.1   < > 4.2   < > 4.0 3.8 3.5 4.1 3.6  CL 108  --  109 106   < > 105  --  106  --   --   --  108 106 107  CO2 20*  --  18* 19*  --   --   --   --   --   --   --  21* 19* 21*  GLUCOSE 123*  --  116* 218*   < > 131*  --  151*  --   --   --  136* 151* 97  BUN 44*  --  34* 35*   < > 25*  --  24*  --   --   --  20 17 15   CREATININE 2.71*  --  2.55* 2.31*    < > 1.70*  --  1.70*  --   --   --  1.71* 1.82* 1.71*  CALCIUM  8.4*  --  8.5* 8.6*  --   --   --   --   --   --   --  7.6* 7.6* 7.7*  MG 2.7*  --  2.4 2.4  --   --   --   --   --   --   --  3.0* 2.5*  --    < > = values in this interval not displayed.    CBC: Recent Labs  Lab 11/08/23 0606 11/08/23 1145 11/08/23 1515 11/08/23 1544 11/08/23 1838 11/08/23 2203 11/08/23 2331 11/09/23 0414 11/09/23 1600 11/10/23 0457  WBC 6.6  --   --   --  8.8  --   --  8.0 10.8* 9.6  HGB 10.5*   < > 7.6*   < > 9.9* 9.2* 9.9* 10.8* 10.9* 10.4*  HCT 30.8*   < > 22.3*   < >  29.2* 27.0* 29.0* 32.0* 32.4* 31.7*  MCV 91.7  --   --   --  87.4  --   --  88.2 87.8 89.5  PLT 129*  --  68*  --  91*  --   --  87* 87* 75*   < > = values in this interval not displayed.     Coagulation Studies: Recent Labs    11/08/23 1838  LABPROT 19.8*  INR 1.6*    Imaging MRI brain:  No acute abnormalities   ASSESSMENT AND PLAN: Michael Bender is a 78 y.o. male with hx of AKI on CKD, prior MSSA endocarditis complicated by severe aortic insufficiency, pAfibb, DM2, HTN, HLD who is admitted with decompensated HF and underwent aortic valve replacement today. Extubated post procedure and transferred to CVICU.   A code stroke was activated for noted new onset seizure with Left arm and L leg and L face twitching with L gaze. Had a seizure lasting 1 mins, followed shortly by a second seizure.    MRI brain was negative for stroke or other acute abnormality.  Patient has had no further seizure activity and states he does not remember anything from the day of his surgery.     New onset seizures - Continue Keppra 1000 mg twice daily.  Cannot increase further due to low GFR -Avoid Depakote due to thrombocytopenia and avoid Vimpat due to prolonged PR interval - If any further seizures, can add Topamax or clonazepam - As needed IV Versed  for clinical seizures - Continue seizure precautions - Rest per primary  team -Neurology will sign off, please call with questions or concerns   Patient seen by NP and then by MD, MD to edit note as needed. Cortney E Everitt Clint Kill , MSN, AGACNP-BC [facility] See Amion for schedule and pager information 11/11/2023 8:45 AM   Attending Neurohospitalist Addendum Patient seen and examined with APP/Resident. Agree with the history and physical as documented above. Agree with the plan as documented, which I helped formulate. I have edited the note above to reflect my full findings and recommendations. I have independently reviewed the chart, obtained history, review of systems and examined the patient.I have personally reviewed pertinent head/neck/spine imaging (CT/MRI). Please feel free to call with any questions.  -- Elida Ross, MD [facility] [phone]  If 7pm- 7am, please page neurology on call as listed in AMION.

## 2023-11-11 NOTE — Progress Notes (Addendum)
 [address] [place] [employer] [address]             [phone]      3 Days Post-Op Procedure(s) (LRB): AORTIC VALVE REPLACEMENT USING A INSPIRIS VALVE. (N/A) CLIPPING OF LEFT ATRIAL APPENDAGE USING A CLIP (N/A) ECHOCARDIOGRAM, TRANSESOPHAGEAL, INTRAOPERATIVE (N/A) Subjective: Patient sitting on bedside commode, reports he feels rough. Is working on a bowel movement and has already had a couple, his stomach hurts and he has a headache. Reports pain is under control and no SOB.   Objective: Vital signs in last 24 hours: Temp:  [97.3 F (36.3 C)-98.5 F (36.9 C)] 97.3 F (36.3 C) (10/11 1111) Pulse Rate:  [67-129] 119 (10/11 1500) Cardiac Rhythm: Atrial fibrillation (10/11 1400) Resp:  [16-31] 21 (10/11 1500) BP: (83-160)/(47-103) 127/82 (10/11 1445) SpO2:  [73 %-100 %] 90 % (10/11 1500) Weight:  [63.4 kg] 63.4 kg (10/11 0500)  Hemodynamic parameters for last 24 hours:    Intake/Output from previous day: 10/10 0701 - 10/11 0700 In: 806.1 [I.V.:476.1; IV Piggyback:330] Out: 4530 [Urine:4340; Chest Tube:190] Intake/Output this shift: Total I/O In: 735.8 [P.O.:270; I.V.:422.2; IV Piggyback:43.6] Out: 225 [Urine:175; Chest Tube:50]  General appearance: alert, cooperative, and no distress Neurologic: intact Heart: irregularly irregular rhythm and no murmur Lungs: clear to auscultation bilaterally Abdomen: soft, non-tender; bowel sounds normal; no masses,  no organomegaly Extremities: extremities normal, atraumatic, no cyanosis or edema Wound: Clean and dry without sign of infection  Lab Results: Recent Labs    11/10/23 0457 11/11/23 1409  WBC 9.6 8.0  HGB 10.4* 12.6*  HCT 31.7* 38.1*  PLT 75* 102*   BMET:  Recent Labs    11/09/23 1600 11/10/23 0457  NA 136 139  K 4.1 3.6  CL 106 107  CO2 19* 21*  GLUCOSE 151* 97  BUN 17 15  CREATININE 1.82* 1.71*  CALCIUM  7.6* 7.7*    PT/INR:  Recent Labs    11/08/23 1838  LABPROT  19.8*  INR 1.6*   ABG    Component Value Date/Time   PHART 7.361 11/08/2023 2331   HCO3 21.0 11/08/2023 2331   TCO2 22 11/08/2023 2331   ACIDBASEDEF 4.0 (H) 11/08/2023 2331   O2SAT 98 11/08/2023 2331   CBG (last 3)  Recent Labs    11/11/23 0346 11/11/23 0724 11/11/23 1101  GLUCAP 133* 112* 195*    Assessment/Plan: S/P Procedure(s) (LRB): AORTIC VALVE REPLACEMENT USING A INSPIRIS VALVE. (N/A) CLIPPING OF LEFT ATRIAL APPENDAGE USING A CLIP (N/A) ECHOCARDIOGRAM, TRANSESOPHAGEAL, INTRAOPERATIVE (N/A)  Neuro: Postop new onset seizure. On Keppra 1000mg  BID per neurology. Avoid Depakote and Vimpat. MRI brain without acute abnormality but did show moderately severe chronic small vessel ischemic disease. Appreciate neurology's assistance. Has a headache, continue Tylenol  Q6H  CV: Hx of afib, now with postop afib. Afib with RVR HR 110s-120s. Continue IV Amiodarone, received another bolus this AM. SBP mostly 120s. On Lopressor  25mg  BID.   Pulm: Saturating well on 2L [place]. CXR with minimal left basilar atelectasis. CT output 190cc/24hrs. D/C chest tubes today. Encourage IS and ambulation. Wean O2 for spO2>90%.   GI: Multiple bowel movements today. Tolerating a diet. Abdominal discomfort but no tenderness or distension on exam. Patient's son reports he has not had a bowel movement in 2-3 weeks. Continue current stool softeners.   Endo: CBGs controlled on Lantus  6U daily and AC/HS SSI  Renal: Cr 2.1, bumped from 1.71 yesterday. UO 4340cc/24hrs.  K 3.8, supplement.   ID: No leukocytosis, afebrile. Amiodarone infiltrated, PICC line in place since patient keeps pulling on IVs.   Expected postop ABLA: H/H 12.6/38.1, not clinically significant at this time. Thrombocytopenia, plt 102,000 improving.   DVT Prophylaxis: SubQ heparin  5000U Q8H  Deconditioning: PT/OT recommend CIR. CIR consult has been placed and they are following.   Dispo: Continue ICU care for now until heart rate and  rhythm are better controlled.    LOS: 9 days    Con GORMAN Bend, PA-C 11/11/2023   Chart reviewed, patient examined, agree with above.  He is now back in NSR after multiple boluses of amio. Will continue IV drip for now.  -3700 cc yesterday and wt down 10 lbs if accurate. Only 3 lbs over preop. Creat bumped today to 2.1 so holding on further diuresis.

## 2023-11-11 NOTE — Progress Notes (Addendum)
 Possible vagal episode while having BM-  patient 2 person assist to [facility], able to participate with 75% effort, patient had type 2 BM while on commode, then tried to get back to bed quickly, patient seemed to ignore my instructions and I wondered if he couldn't hear me, patient was staring ahead and not making eye contact with me, HR more irregular and faster at this time (140s), O2 sats not reading well- poor waveform/sats low 80s, patient sat back down on commode, seemed pale to slightly gray in color (not pink), O2 turned up and patient instructed to breath deeply, patient with slight tremor, requested help to get patient back to bed, patient began to follow commands more readily and making eye contact, pt oriented to person/place and speaking clearly, color improved within a minute, 3 person assist back to bed, patient stated he felt weak but didn't experience any dizziness or neuro symptoms, no loss of consciousness, MAE x4 as previously assessed, patient perfusion as previously assessed, automatic BP cuff was not on patient during this episode

## 2023-11-11 NOTE — Progress Notes (Signed)
 Peripherally Inserted Central Catheter Placement  The IV Nurse has discussed with the patient and/or persons authorized to consent for the patient, the purpose of this procedure and the potential benefits and risks involved with this procedure.  The benefits include less needle sticks, lab draws from the catheter, and the patient may be discharged home with the catheter. Risks include, but not limited to, infection, bleeding, blood clot (thrombus formation), and puncture of an artery; nerve damage and irregular heartbeat and possibility to perform a PICC exchange if needed/ordered by physician.  Alternatives to this procedure were also discussed.  Bard Power PICC patient education guide, fact sheet on infection prevention and patient information card has been provided to patient /or left at bedside.  PICC placed by Zebedee Jaeger, RN  PICC Placement Documentation  PICC Double Lumen 11/11/23 Right Basilic 36 cm 0 cm (Active)  Indication for Insertion or Continuance of Line Vasoactive infusions 11/11/23 1145  Exposed Catheter (cm) 0 cm 11/11/23 1145  Site Assessment Clean, Dry, Intact 11/11/23 1145  Lumen #1 Status Flushed;Saline locked;Blood return noted 11/11/23 1145  Lumen #2 Status Flushed;Saline locked;Blood return noted 11/11/23 1145  Dressing Type Transparent;Securing device 11/11/23 1145  Dressing Status Antimicrobial disc/dressing in place;Clean, Dry, Intact 11/11/23 1145  Line Care Connections checked and tightened 11/11/23 1145  Line Adjustment (NICU/IV Team Only) No 11/11/23 1145  Dressing Intervention New dressing;Adhesive placed at insertion site (IV team only) 11/11/23 1145  Dressing Change Due 11/18/23 11/11/23 1145       Michael Bender 11/11/2023, 11:45 AM

## 2023-11-11 NOTE — Progress Notes (Signed)
 Inpatient Rehab Admissions Coordinator:    CIR consult received. I attempted to see Pt but she was having sterile procedure. I will continue attempts to discuss.   Leita Kleine, MS, CCC-SLP Rehab Admissions Coordinator  [phone] (celll) [phone] (office)

## 2023-11-12 ENCOUNTER — Encounter (HOSPITAL_COMMUNITY)

## 2023-11-12 LAB — GLUCOSE, CAPILLARY
Glucose-Capillary: 107 mg/dL — ABNORMAL HIGH (ref 70–99)
Glucose-Capillary: 119 mg/dL — ABNORMAL HIGH (ref 70–99)
Glucose-Capillary: 156 mg/dL — ABNORMAL HIGH (ref 70–99)
Glucose-Capillary: 222 mg/dL — ABNORMAL HIGH (ref 70–99)
Glucose-Capillary: 170 mg/dL — ABNORMAL HIGH (ref 70–99)

## 2023-11-12 LAB — MAGNESIUM: Magnesium: 2.2 mg/dL (ref 1.7–2.4)

## 2023-11-12 LAB — BASIC METABOLIC PANEL WITH GFR
Anion gap: 11 (ref 5–15)
BUN: 34 mg/dL — ABNORMAL HIGH (ref 8–23)
CO2: 23 mmol/L (ref 22–32)
Calcium: 7.5 mg/dL — ABNORMAL LOW (ref 8.9–10.3)
Chloride: 102 mmol/L (ref 98–111)
Creatinine, Ser: 2.04 mg/dL — ABNORMAL HIGH (ref 0.61–1.24)
GFR, Estimated: 33 mL/min — ABNORMAL LOW (ref >60)
Glucose, Bld: 119 mg/dL — ABNORMAL HIGH (ref 70–99)
Potassium: 3.1 mmol/L — ABNORMAL LOW (ref 3.5–5.1)
Sodium: 136 mmol/L (ref 135–145)

## 2023-11-12 LAB — HEMOGLOBIN A1C
Hgb A1c MFr Bld: 7.7 % — ABNORMAL HIGH (ref 4.8–5.6)
Mean Plasma Glucose: 174.29 mg/dL

## 2023-11-12 MED ORDER — INSULIN ASPART 100 UNIT/ML IJ SOLN
2.0000 [IU] | Freq: Three times a day (TID) | INTRAMUSCULAR | Status: DC
  Administered 2023-11-13: 2 [IU] via SUBCUTANEOUS

## 2023-11-12 MED ORDER — POTASSIUM CHLORIDE CRYS ER 20 MEQ PO TBCR
40.0000 meq | EXTENDED_RELEASE_TABLET | Freq: Four times a day (QID) | ORAL | Status: AC
  Administered 2023-11-12 (×2): 40 meq via ORAL
  Filled 2023-11-12 (×2): qty 2

## 2023-11-12 MED ORDER — EMPAGLIFLOZIN 10 MG PO TABS
10.0000 mg | ORAL_TABLET | Freq: Every day | ORAL | Status: DC
  Administered 2023-11-12 – 2023-11-15 (×4): 10 mg via ORAL
  Filled 2023-11-12 (×4): qty 1

## 2023-11-12 NOTE — Progress Notes (Signed)
 4 Days Post-Op Procedure(s) (LRB): AORTIC VALVE REPLACEMENT USING A INSPIRIS VALVE. (N/A) CLIPPING OF LEFT ATRIAL APPENDAGE USING A CLIP (N/A) ECHOCARDIOGRAM, TRANSESOPHAGEAL, INTRAOPERATIVE (N/A) Subjective: No complaints. Has not waked yet today.  Objective: Vital signs in last 24 hours: Temp:  [97.3 F (36.3 C)-98.7 F (37.1 C)] 98.1 F (36.7 C) (10/12 0745) Pulse Rate:  [82-144] 94 (10/12 1000) Cardiac Rhythm: Normal sinus rhythm (10/12 0800) Resp:  [14-37] 14 (10/12 1000) BP: (95-147)/(47-89) 127/70 (10/12 1000) SpO2:  [78 %-100 %] 98 % (10/12 1000) Weight:  [64 kg] 64 kg (10/12 0700)  Hemodynamic parameters for last 24 hours:    Intake/Output from previous day: 10/11 0701 - 10/12 0700 In: 1481.3 [P.O.:470; I.V.:951.3; IV Piggyback:60] Out: 500 [Urine:450; Chest Tube:50] Intake/Output this shift: Total I/O In: 69.5 [I.V.:69.5] Out: -   General appearance: alert and cooperative Neurologic: intact Heart: regular rate and rhythm Lungs: clear to auscultation bilaterally Extremities: no edema Wound: incision healing well  Lab Results: Recent Labs    11/10/23 0457 11/11/23 1409  WBC 9.6 8.0  HGB 10.4* 12.6*  HCT 31.7* 38.1*  PLT 75* 102*   BMET:  Recent Labs    11/11/23 1409 11/12/23 0522  NA 135 136  K 3.8 3.1*  CL 100 102  CO2 20* 23  GLUCOSE 289* 119*  BUN 32* 34*  CREATININE 2.10* 2.04*  CALCIUM  7.5* 7.5*    PT/INR: No results for input(s): LABPROT, INR in the last 72 hours. ABG    Component Value Date/Time   PHART 7.361 11/08/2023 2331   HCO3 21.0 11/08/2023 2331   TCO2 22 11/08/2023 2331   ACIDBASEDEF 4.0 (H) 11/08/2023 2331   O2SAT 98 11/08/2023 2331   CBG (last 3)  Recent Labs    11/11/23 2154 11/12/23 0651 11/12/23 0743  GLUCAP 184* 107* 119*    Assessment/Plan: S/P Procedure(s) (LRB): AORTIC VALVE REPLACEMENT USING A INSPIRIS VALVE. (N/A) CLIPPING OF LEFT ATRIAL APPENDAGE USING A CLIP  (N/A) ECHOCARDIOGRAM, TRANSESOPHAGEAL, INTRAOPERATIVE (N/A)  POD 4  Hemodynamically stable in NSR on IV amio 30 and Lopressor . Will continue IV amio today and switch to po tomorrow.  Wt is up 1 lb from yesterday but only 4 lbs over preop and he has no edema. Creat bumped to 2.1 yesterday evening and down to 2.0 this am. K+ is 3.1 and we are replacing. Hold off on any further diuresis for now.  Glucose under good control on Lantus  and SSI.  Continue IS, OOB, ambulate.  CIR consult pending.   LOS: 10 days    Michael Bender 11/12/2023

## 2023-11-12 NOTE — Progress Notes (Signed)
 Bladder scan shows in bladder, however patient does not have an urge urinate. Patient encouraged to void in urinal. Patient refuses in and out craterization at this time. Will continue to monitor

## 2023-11-12 NOTE — Progress Notes (Signed)
 Patient ID: Michael Bender, male   DOB: 1945-11-11, 78 y.o.   MRN: [id number]  TCTS Evening Rounds:  Hemodynamically stable in NSR on IV amio.  Ambulated twice around the ICU today.

## 2023-11-12 NOTE — Progress Notes (Signed)
 NAME:  Michael Bender, MRN:  [id number], DOB:  [birth year], LOS: 10 ADMISSION DATE:  11/01/2023, CONSULTATION DATE:  10/8 REFERRING MD:  Daniel MA CHIEF COMPLAINT:  AVR  History of Present Illness:  78 year old male with past medical history of diabetes, hypertension, hyperlipidemia,  history of GIB 2/2 duodenal ulcer 2021, CKD, atrial fibrillation, combined systolic and diastolic CHF, severe aortic insufficiency 2/2 MSSA endocarditis seeded from MSSA bacteremia 2/2 septic arthritis 11/2022 who presented to the emergency department on 11/01/23 with shortness of breath and poor appetite. He was found to be bradycardic in 50s. Felt, despite negative work up, that he would need repeat echo and admission given history of valve infection. Admitted to medicine. Echo 11/02/23 with LVEF 60-65%, G1DD, moderate AVR, calcified aortic root. TCTS consult who planned for [facility], PFT and plan for AVR+LAAL on day of ICU admission. 10/6 had R/LHC with severe AI with wide pulse pressure.   Pump time: 2h 19m Xclamp time: 1h 76m Cell saver: 660cc EBL:  Pertinent  Medical History  diabetes, hypertension, hyperlipidemia,  history of GIB 2/2 duodenal ulcer 2021, CKD, atrial fibrillation, combined systolic and diastolic CHF, severe aortic insufficiency 2/2 MSSA endocarditis seeded from MSSA bacteremia 2/2 septic arthritis 11/2022  Significant Hospital Events: Including procedures, antibiotic start and stop dates in addition to other pertinent events   10/1: admit for fatigue and repeat echo  10/2: severe AI, TCTS consult  10/8: AVR+LAAL. Seizure post-extubation. 10/10 Afib with RVR, amiodarone started  Interim History / Subjective:  Feels well. Appetite is good-- ate most of his breakfast this morning. His pain is controlled.   Objective   Blood pressure 120/74, pulse 95, temperature 98.4 F (36.9 C), temperature source Oral, resp. rate (!) 22, height 5' 11 (1.803 m), weight 64 kg, SpO2 99%.         Intake/Output Summary (Last 24 hours) at 11/12/2023 1608 Last data filed at 11/12/2023 1519 Gross per 24 hour  Intake 1568.56 ml  Output 50 ml  Net 1518.56 ml   Filed Weights   11/10/23 0500 11/11/23 0500 11/12/23 0700  Weight: 67.6 kg 63.4 kg 64 kg    Examination: General: frail appearing elderly man sitting up in the chair in NAD HENT: temporal wasting Lungs: breathing comfortably onNC, CTA Cardiovascular: S1S2, RRR, NSR on tele Abdomen: soft, NT Extremities: no cyanosis; minimal muscle mass Neuro: awake, alert, moving all extremities, answering questions appropriately  K+ 3.1 BUN 34 Cr 2.03 WBC 8 H/H 12.6/38.1 Platelets 102   Resolved Hospital Problem list    Assessment & Plan:  Severe aortic insufficiency following MSSA endocarditis in 11/2022, s/p bioprosthetic AVR  LAA ligation  Chronic HFpEF -post-op care per TCTS -pain control per protocol; no tramadol with seizures -aspirin, statin -can restart metoprolol  today now that he is not needing amiodarone boluses -advance mobility and diet-- walked 2 laps in the hall today -hold additional diuresis today  New onset seizure, acute encephalopathy-- concern this was post-ictal. Not sure if he has baseline cognitive deficits based on advanced smell vessel disease on MRI. -appreciate neurology's management -have previously discussed seizure precautions at home with both sons; they confirmed he does not drive -con't keppra; can follow up with OP neurology regarding if keppra is needed long-term -would not restart PTA elavil  since it lowers seizure threshold -PT, OT, SLP -no tramadol due to increased risk of seizures  Expected post-operative blood loss anemia  Expected post operative thrombocytopenia  -neither is clinically significant -aspirin -monitor  AKI on  CKD; continuing to improve today Baseline Cr 1.8-2.2  -foley out -maintain adequate perfusion -strict I/O -trend renal function daily  pAF with  RVR; not on AC PTA. S/p LAA ligation. Back in NSR today. -amiodarone IV; hopefully can transition to PO tomorrow -no need for full AC -monitor electrolytes and replete as needed -keep PICC while on IV amiodarone  Diabetes with controlled hyperglycemia; A1c 8.7 -con't glargine 6 units daily, adding mealtime novolog  2units TIDAC -SSI PRN -add back jardiance  -goal BG <180  HTN HLD -statin -con't metoprolol  -hold amlodipine  and hydralazine   History of migraines  -con't holding elavil ; as OP can consider changing metoprolol  to propranolol for HA prophylaxis -prn sumatriptan  for now > ok to continue since no stroke  Deconditioning -ambulate -CIR referral placed last week -PT, OT  Hypokalemia -repleted -monitor  Unclear if he has history of RLS, do not see diagnosis of Parkinsons  -con't requip   No family at bedside today during rounds.    Labs   CBC: Recent Labs  Lab 11/08/23 1838 11/08/23 2203 11/08/23 2331 11/09/23 0414 11/09/23 1600 11/10/23 0457 11/11/23 1409  WBC 8.8  --   --  8.0 10.8* 9.6 8.0  NEUTROABS  --   --   --   --   --   --  7.0  HGB 9.9*   < > 9.9* 10.8* 10.9* 10.4* 12.6*  HCT 29.2*   < > 29.0* 32.0* 32.4* 31.7* 38.1*  MCV 87.4  --   --  88.2 87.8 89.5 88.2  PLT 91*  --   --  87* 87* 75* 102*   < > = values in this interval not displayed.    Basic Metabolic Panel: Recent Labs  Lab 11/07/23 0442 11/08/23 0606 11/08/23 1145 11/09/23 0414 11/09/23 1600 11/10/23 0457 11/11/23 1409 11/12/23 0522  NA 139 137   < > 140 136 139 135 136  K 4.5 3.9   < > 3.5 4.1 3.6 3.8 3.1*  CL 109 106   < > 108 106 107 100 102  CO2 18* 19*  --  21* 19* 21* 20* 23  GLUCOSE 116* 218*   < > 136* 151* 97 289* 119*  BUN 34* 35*   < > 20 17 15  32* 34*  CREATININE 2.55* 2.31*   < > 1.71* 1.82* 1.71* 2.10* 2.04*  CALCIUM  8.5* 8.6*  --  7.6* 7.6* 7.7* 7.5* 7.5*  MG 2.4 2.4  --  3.0* 2.5*  --   --  2.2   < > = values in this interval not displayed.    GFR: Estimated Creatinine Clearance: 27 mL/min (A) (by C-G formula based on SCr of 2.04 mg/dL (H)). Recent Labs  Lab 11/09/23 0414 11/09/23 1600 11/10/23 0457 11/11/23 1409  WBC 8.0 10.8* 9.6 8.0    Critical care time:       Leita SHAUNNA Gaskins, DO 11/12/23 4:34 PM [facility]  For contact information, see Amion. If no response to pager, please call PCCM consult pager. After hours, 7PM- 7AM, please call Elink.

## 2023-11-13 ENCOUNTER — Inpatient Hospital Stay (HOSPITAL_COMMUNITY)

## 2023-11-13 DIAGNOSIS — E119 Type 2 diabetes mellitus without complications: Secondary | ICD-10-CM

## 2023-11-13 DIAGNOSIS — I480 Paroxysmal atrial fibrillation: Secondary | ICD-10-CM

## 2023-11-13 DIAGNOSIS — I5032 Chronic diastolic (congestive) heart failure: Secondary | ICD-10-CM

## 2023-11-13 DIAGNOSIS — Z9889 Other specified postprocedural states: Secondary | ICD-10-CM

## 2023-11-13 DIAGNOSIS — Z953 Presence of xenogenic heart valve: Secondary | ICD-10-CM

## 2023-11-13 DIAGNOSIS — Z952 Presence of prosthetic heart valve: Secondary | ICD-10-CM

## 2023-11-13 DIAGNOSIS — E43 Unspecified severe protein-calorie malnutrition: Secondary | ICD-10-CM

## 2023-11-13 LAB — CBC
HCT: 31.5 % — ABNORMAL LOW (ref 39.0–52.0)
Hemoglobin: 10.5 g/dL — ABNORMAL LOW (ref 13.0–17.0)
MCH: 29.6 pg (ref 26.0–34.0)
MCHC: 33.3 g/dL (ref 30.0–36.0)
MCV: 88.7 fL (ref 80.0–100.0)
Platelets: 90 K/uL — ABNORMAL LOW (ref 150–400)
RBC: 3.55 MIL/uL — ABNORMAL LOW (ref 4.22–5.81)
RDW: 17 % — ABNORMAL HIGH (ref 11.5–15.5)
WBC: 5.8 K/uL (ref 4.0–10.5)
nRBC: 0 % (ref 0.0–0.2)

## 2023-11-13 LAB — GLUCOSE, CAPILLARY
Glucose-Capillary: 95 mg/dL (ref 70–99)
Glucose-Capillary: 94 mg/dL (ref 70–99)
Glucose-Capillary: 166 mg/dL — ABNORMAL HIGH (ref 70–99)

## 2023-11-13 LAB — BASIC METABOLIC PANEL WITH GFR
Anion gap: 11 (ref 5–15)
BUN: 38 mg/dL — ABNORMAL HIGH (ref 8–23)
CO2: 22 mmol/L (ref 22–32)
Calcium: 7.5 mg/dL — ABNORMAL LOW (ref 8.9–10.3)
Chloride: 103 mmol/L (ref 98–111)
Creatinine, Ser: 2.15 mg/dL — ABNORMAL HIGH (ref 0.61–1.24)
GFR, Estimated: 31 mL/min — ABNORMAL LOW (ref >60)
Glucose, Bld: 91 mg/dL (ref 70–99)
Potassium: 4.1 mmol/L (ref 3.5–5.1)
Sodium: 136 mmol/L (ref 135–145)

## 2023-11-13 MED ORDER — FUROSEMIDE 10 MG/ML IJ SOLN: 40.0000 mg | Freq: Once | INTRAMUSCULAR | Status: DC

## 2023-11-13 MED ORDER — POTASSIUM CHLORIDE CRYS ER 20 MEQ PO TBCR
40.0000 meq | EXTENDED_RELEASE_TABLET | Freq: Once | ORAL | Status: AC
  Administered 2023-11-13: 40 meq via ORAL
  Filled 2023-11-13: qty 2

## 2023-11-13 MED ORDER — FUROSEMIDE 40 MG PO TABS
40.0000 mg | ORAL_TABLET | Freq: Every day | ORAL | Status: DC
  Administered 2023-11-13 – 2023-11-15 (×3): 40 mg via ORAL
  Filled 2023-11-13 (×3): qty 1

## 2023-11-13 MED ORDER — LEVETIRACETAM 500 MG PO TABS
500.0000 mg | ORAL_TABLET | Freq: Two times a day (BID) | ORAL | Status: DC
  Administered 2023-11-13 – 2023-11-15 (×4): 500 mg via ORAL
  Filled 2023-11-13 (×2): qty 2
  Filled 2023-11-13 (×2): qty 1

## 2023-11-13 MED ORDER — ~~LOC~~ CARDIAC SURGERY, PATIENT & FAMILY EDUCATION: Freq: Once | Status: DC

## 2023-11-13 MED ORDER — SODIUM CHLORIDE 0.9% FLUSH
3.0000 mL | Freq: Two times a day (BID) | INTRAVENOUS | Status: DC
  Administered 2023-11-14 – 2023-11-15 (×3): 3 mL via INTRAVENOUS

## 2023-11-13 MED ORDER — AMIODARONE HCL 200 MG PO TABS: 200.0000 mg | ORAL_TABLET | Freq: Two times a day (BID) | ORAL | Status: DC

## 2023-11-13 MED ORDER — SODIUM CHLORIDE 0.9% FLUSH: 3.0000 mL | INTRAVENOUS | Status: DC | PRN

## 2023-11-13 MED ORDER — AMIODARONE HCL 200 MG PO TABS
400.0000 mg | ORAL_TABLET | Freq: Two times a day (BID) | ORAL | Status: DC
  Administered 2023-11-13 – 2023-11-15 (×5): 400 mg via ORAL
  Filled 2023-11-13 (×5): qty 2

## 2023-11-13 MED ORDER — INSULIN GLARGINE 100 UNIT/ML ~~LOC~~ SOLN
5.0000 [IU] | Freq: Every day | SUBCUTANEOUS | Status: DC
  Administered 2023-11-14 – 2023-11-15 (×2): 5 [IU] via SUBCUTANEOUS
  Filled 2023-11-13 (×2): qty 0.05

## 2023-11-13 MED ORDER — SODIUM CHLORIDE 0.9 % IV SOLN: 250.0000 mL | INTRAVENOUS | Status: AC | PRN

## 2023-11-13 NOTE — Progress Notes (Signed)
 5 Days Post-Op Procedure(s) (LRB): AORTIC VALVE REPLACEMENT USING A INSPIRIS VALVE. (N/A) CLIPPING OF LEFT ATRIAL APPENDAGE USING A CLIP (N/A) ECHOCARDIOGRAM, TRANSESOPHAGEAL, INTRAOPERATIVE (N/A) Subjective: Much improved after having several bowel movements over the weekend Remains in NSR Ambulated the unit multiple times Eating breakfast well this morning Weight just a few pounds up from pre-op  Objective: Vital signs in last 24 hours: BP 133/68   Pulse 82   Temp 98.2 F (36.8 C) (Oral)   Resp (!) 23   Ht 5' 11 (1.803 m)   Wt 65 kg   SpO2 99%   BMI 19.99 kg/m  Filed Weights   11/11/23 0500 11/12/23 0700 11/13/23 0700  Weight: 63.4 kg 64 kg 65 kg    Hemodynamic parameters for last 24 hours:    Intake/Output from previous day: 10/12 0701 - 10/13 0700 In: 1601.4 [P.O.:1200; I.V.:401.4] Out: 825 [Urine:825] Intake/Output this shift: No intake/output data recorded.  Physical Exam: General - Resting comfortably in chair CV - RRR Resp - Unlabored on 2L  Abd - Soft, ND/NT Ext - Almost no leg edema  Lab Results:    Latest Ref Rng & Units 11/13/2023    4:43 AM 11/11/2023    2:09 PM 11/10/2023    4:57 AM  CBC  WBC 4.0 - 10.5 K/uL 5.8  8.0  9.6   Hemoglobin 13.0 - 17.0 g/dL 89.4  87.3  89.5   Hematocrit 39.0 - 52.0 % 31.5  38.1  31.7   Platelets 150 - 400 K/uL 90  102  75       Latest Ref Rng & Units 11/13/2023    4:43 AM 11/12/2023    5:22 AM 11/11/2023    2:09 PM  CMP  Glucose 70 - 99 mg/dL 91  880  710   BUN 8 - 23 mg/dL 38  34  32   Creatinine 0.61 - 1.24 mg/dL 7.84  7.95  7.89   Sodium 135 - 145 mmol/L 136  136  135   Potassium 3.5 - 5.1 mmol/L 4.1  3.1  3.8   Chloride 98 - 111 mmol/L 103  102  100   CO2 22 - 32 mmol/L 22  23  20    Calcium  8.9 - 10.3 mg/dL 7.5  7.5  7.5     CXR: No effusions, no pneumothorax  Assessment/Plan: S/P Procedure(s) (LRB): AORTIC VALVE REPLACEMENT USING A INSPIRIS VALVE. (N/A) CLIPPING OF LEFT  ATRIAL APPENDAGE USING A CLIP (N/A) ECHOCARDIOGRAM, TRANSESOPHAGEAL, INTRAOPERATIVE (N/A) POD5 s/p AVR, post-op course complicated by seizure (MRI negative) and afib, now in NSR after amio load NEURO- intact  Pain control PRN Deconditioning- PT/OT CV- in SR around 90 bpm             Transition to PO amio today  Continue metoprolol  RESP- Continued improved lung aeration             Continue IS, pulm hygiene, ambulation  Wean oxygen RENAL- creatinine stable (pre-op Cre 2.1-2.3) and lytes Ok  Weight up 3 lbs from pre-op             PO lasix  today GI- tolerating regular diet  BM: history of constipation, continue bowel regimen Endo- BG well controlled Continue lantus  + ISS ACHS ID- NAI DVT ppx - SCD + HSQ 5000 TID  Dispo: transfer to floor   LOS: 11 days    Con RAMAN Jenicka Coxe 11/13/2023

## 2023-11-13 NOTE — Progress Notes (Signed)
 Physical Therapy Treatment Patient Details Name: Michael Bender MRN: [id number] DOB: 07/07/1945 Today's Date: 11/13/2023   History of Present Illness 78 yo male s/p AVR with clipping of atrial appendage 10/8. New onset seizure with  Left arm and L leg and L face twitching with L gaze post op. 10/10 Afib with RVR. PMH: peripheral vascular disease, hypertension, hyperlipidemia, history of orthostatic hypotension, GERD, multiple falls, traumatic rhabdomyolysis, AKI, acute urinary retention with bladder outlet obstruction, BPH.    PT Comments  Progressing well towards acute functional goals. Transitioned to RW today, maintains sternal precautions with cues. VSS throughout session on RA. Ambulating with intermittent min assist for balance; cues for awareness and techniques to improve stability. Reviewed LE exercises and precautions. Patient will continue to benefit from skilled physical therapy services to further improve independence with functional mobility.     If plan is discharge home, recommend the following: Assist for transportation;Assistance with cooking/housework;A little help with walking and/or transfers   Can travel by private vehicle        Equipment Recommendations  None recommended by PT    Recommendations for Other Services Rehab consult     Precautions / Restrictions Precautions Precautions: Fall;Other (comment) (seizure) Recall of Precautions/Restrictions: Impaired Restrictions Weight Bearing Restrictions Per Provider Order: Yes RUE Weight Bearing Per Provider Order: Non weight bearing LUE Weight Bearing Per Provider Order: Non weight bearing Other Position/Activity Restrictions: sternal     Mobility  Bed Mobility Overal bed mobility: Needs Assistance Bed Mobility: Supine to Sit     Supine to sit: Min assist, HOB elevated     General bed mobility comments: Able to rise with CGA, cues for precautions, holding heart pillow. Min assist to scoot hips to EOB  safely.    Transfers Overall transfer level: Needs assistance Equipment used: Rolling walker (2 wheels) Transfers: Sit to/from Stand Sit to Stand: Min assist           General transfer comment: Min assist for balance to rise and steady, slowly transitioned to RW with light support. Shows some instability with lateral lean initially but improved. Holds heart pillow to transition. Needs cues to release RW prior to sitting.    Ambulation/Gait Ambulation/Gait assistance: Min assist Gait Distance (Feet): 325 Feet Assistive device: Rolling walker (2 wheels) Gait Pattern/deviations: Step-through pattern, Narrow base of support, Decreased stride length, Staggering right Gait velocity: decreased Gait velocity interpretation: <1.8 ft/sec, indicate of risk for recurrent falls   General Gait Details: Educated on safe AD use with transition to RW today. Intermittent cues for upright posture with forward gaze but maintains precautions well throughout duration. Pt with a couple of episodes of LOB requiring light min assist to correct, notably with sharp turns and initial steps. Cues for safety and awareness. VSS throughout on RA. SpO2 95%.   Stairs             Wheelchair Mobility     Tilt Bed    Modified Rankin (Stroke Patients Only)       Balance Overall balance assessment: Needs assistance Sitting-balance support: No upper extremity supported, Feet supported Sitting balance-Leahy Scale: Fair     Standing balance support: No upper extremity supported Standing balance-Leahy Scale: Fair Standing balance comment: CGA for safety                            Communication Communication Communication: Impaired Factors Affecting Communication: Hearing impaired  Cognition Arousal: Alert Behavior During Therapy: [facility]  for tasks assessed/performed   PT - Cognitive impairments: Problem solving, Safety/Judgement                         Following commands:  Impaired Following commands impaired: Follows multi-step commands inconsistently    Cueing Cueing Techniques: Verbal cues, Gestural cues  Exercises General Exercises - Lower Extremity Ankle Circles/Pumps: AROM, Both, 15 reps, Seated Quad Sets: Strengthening, Both, 10 reps, Supine Gluteal Sets: Strengthening, Both, 10 reps, Seated Long Arc Quad: Strengthening, Both, 10 reps, Seated    General Comments General comments (skin integrity, edema, etc.): BP after activity while sitting 125/63.      Pertinent Vitals/Pain Pain Assessment Pain Assessment: No/denies pain    Home Living                          Prior Function            PT Goals (current goals can now be found in the care plan section) Acute Rehab PT Goals Patient Stated Goal: to go home PT Goal Formulation: With patient Time For Goal Achievement: 11/24/23 Potential to Achieve Goals: Good Progress towards PT goals: Progressing toward goals    Frequency    Min 3X/week      PT Plan      Co-evaluation              AM-PAC PT 6 Clicks Mobility   Outcome Measure  Help needed turning from your back to your side while in a flat bed without using bedrails?: A Little Help needed moving from lying on your back to sitting on the side of a flat bed without using bedrails?: A Little Help needed moving to and from a bed to a chair (including a wheelchair)?: A Little Help needed standing up from a chair using your arms (e.g., wheelchair or bedside chair)?: A Little Help needed to walk in hospital room?: A Little Help needed climbing 3-5 steps with a railing? : A Lot 6 Click Score: 17    End of Session Equipment Utilized During Treatment: Gait belt Activity Tolerance: Patient tolerated treatment well Patient left: with call bell/phone within reach;in chair;with chair alarm set;with family/visitor present Nurse Communication: Mobility status PT Visit Diagnosis: Unsteadiness on feet (R26.81);Other  abnormalities of gait and mobility (R26.89)     Time: 1204-1222 PT Time Calculation (min) (ACUTE ONLY): 18 min  Charges:    $Gait Training: 8-22 mins PT General Charges $$ ACUTE PT VISIT: 1 Visit                     Leontine Roads, PT, DPT [facility]  Rehabilitation Services Physical Therapist Office: [phone] Website: [email or web address]    Leontine GORMAN Roads 11/13/2023, 1:15 PM

## 2023-11-13 NOTE — Progress Notes (Signed)
 Inpatient Rehab Admissions Coordinator:    CIR following. On amio drip today, not yet ready for CIR.   Leita Kleine, MS, CCC-SLP Rehab Admissions Coordinator  [phone] (celll) [phone] (office)

## 2023-11-13 NOTE — Progress Notes (Signed)
 Inpatient Rehab Admissions Coordinator:   I spoke with Pt. Today after noting that he walked 350 ft with Min A. He states he needs to be fully independent to return home as his sons can check on him but cannot stay with him. He would still like to attempt to get insurance auth for CIR. I will pursue for admit.   Leita Kleine, MS, CCC-SLP Rehab Admissions Coordinator  [phone] (celll) [phone] (office)

## 2023-11-13 NOTE — Progress Notes (Addendum)
 NAME:  Michael Bender, MRN:  [id number], DOB:  Sep 18, 1945, LOS: 11 ADMISSION DATE:  11/01/2023, CONSULTATION DATE:  10/8 REFERRING MD:  Daniel MA CHIEF COMPLAINT:  AVR  History of Present Illness:  78 year old male with past medical history of diabetes, hypertension, hyperlipidemia,  history of GIB 2/2 duodenal ulcer 2021, CKD, atrial fibrillation, combined systolic and diastolic CHF, severe aortic insufficiency 2/2 MSSA endocarditis seeded from MSSA bacteremia 2/2 septic arthritis 11/2022 who presented to the emergency department on 11/01/23 with shortness of breath and poor appetite. He was found to be bradycardic in 50s. Felt, despite negative work up, that he would need repeat echo and admission given history of valve infection. Admitted to medicine. Echo 11/02/23 with LVEF 60-65%, G1DD, moderate AVR, calcified aortic root. TCTS consult who planned for [facility], PFT and plan for AVR+LAAL on day of ICU admission. 10/6 had R/LHC with severe AI with wide pulse pressure.   Pump time: 2h 66m Xclamp time: 1h 46m Cell saver: 660cc EBL:  Pertinent  Medical History  diabetes, hypertension, hyperlipidemia,  history of GIB 2/2 duodenal ulcer 2021, CKD, atrial fibrillation, combined systolic and diastolic CHF, severe aortic insufficiency 2/2 MSSA endocarditis seeded from MSSA bacteremia 2/2 septic arthritis 11/2022  Significant Hospital Events: Including procedures, antibiotic start and stop dates in addition to other pertinent events   10/1: admit for fatigue and repeat echo  10/2: severe AI, TCTS consult  10/8: AVR+LAAL. Seizure post-extubation. 10/10 Afib with RVR, amiodarone started  Interim History / Subjective:  No overnight issues, remained in sinus rhythm No more episodes of seizures  Objective   Blood pressure 133/68, pulse 82, temperature 98.2 F (36.8 C), temperature source Oral, resp. rate (!) 23, height 5' 11 (1.803 m), weight 65 kg, SpO2 99%.        Intake/Output Summary  (Last 24 hours) at 11/13/2023 0912 Last data filed at 11/13/2023 0600 Gross per 24 hour  Intake 1481.42 ml  Output 825 ml  Net 656.42 ml   Filed Weights   11/11/23 0500 11/12/23 0700 11/13/23 0700  Weight: 63.4 kg 64 kg 65 kg    Examination: General: Elderly male, lying on the bed HEENT: [place]/AT, eyes anicteric.  moist mucus membranes Neuro: Alert, awake following commands Chest: Central sternotomy incision looks clean and dry, coarse breath sounds, no wheezes or rhonchi Heart: Regular rate and rhythm, no murmurs or gallops Abdomen: Soft, nontender, nondistended, bowel sounds present   Labs and images reviewed  Patient Lines/Drains/Airways Status     Active Line/Drains/Airways     Name Placement date Placement time Site Days   PICC Double Lumen 11/11/23 Right Basilic 36 cm 0 cm 11/11/23  8855  -- 2   Wound 11/08/23 1327 Surgical Closed Surgical Incision Chest Other (Comment) 11/08/23  1327  Chest  5        Resolved Hospital Problem list   Hypokalemia  Assessment & Plan:  Severe aortic insufficiency following MSSA endocarditis in 11/2022, s/p bioprosthetic AVR  Paroxysmal A-fib status post LAA ligation  Chronic HFpEF Continue multimodality pain management Continue aspirin and statin Continue metoprolol  Encourage ambulation and incentive spirometry Went into A-fib with RVR over the weekend, currently in sinus rhythm Switch IV amiodarone to p.o. Hold anticoagulation for now Continue gentle diuresis with Lasix  40 mg daily  New onset seizure, acute encephalopathy-- concern this was post-ictal. Not sure if he has baseline cognitive deficits based on advanced small vessel disease on MRI. No more episodes of seizures Continue Keppra Outpatient follow-up  with neurology  Expected post-operative blood loss anemia, not clinically significant Expected post operative thrombocytopenia status post platelet transfusion Monitor H&H and platelet count Watch for signs of  bleeding  AKI on CKD3B; continuing to improve today Monitor serum creatinine Avoid nephrotoxic agent Monitor intake and output  Diabetes type II Blood sugars are controlled Continue long-acting, sliding scale and mealtime insulin  with CBG goal 140-180 Continue Jardiance   HTN HLD Continue metoprolol  and statin Hold amlodipine  and hydralazine   History of migraines  Continue as needed sumatriptan   Severe protein calorie malnutrition Continue dietary supplements   Labs   CBC: Recent Labs  Lab 11/09/23 0414 11/09/23 1600 11/10/23 0457 11/11/23 1409 11/13/23 0443  WBC 8.0 10.8* 9.6 8.0 5.8  NEUTROABS  --   --   --  7.0  --   HGB 10.8* 10.9* 10.4* 12.6* 10.5*  HCT 32.0* 32.4* 31.7* 38.1* 31.5*  MCV 88.2 87.8 89.5 88.2 88.7  PLT 87* 87* 75* 102* 90*    Basic Metabolic Panel: Recent Labs  Lab 11/07/23 0442 11/08/23 0606 11/08/23 1145 11/09/23 0414 11/09/23 1600 11/10/23 0457 11/11/23 1409 11/12/23 0522 11/13/23 0443  NA 139 137   < > 140 136 139 135 136 136  K 4.5 3.9   < > 3.5 4.1 3.6 3.8 3.1* 4.1  CL 109 106   < > 108 106 107 100 102 103  CO2 18* 19*  --  21* 19* 21* 20* 23 22  GLUCOSE 116* 218*   < > 136* 151* 97 289* 119* 91  BUN 34* 35*   < > 20 17 15  32* 34* 38*  CREATININE 2.55* 2.31*   < > 1.71* 1.82* 1.71* 2.10* 2.04* 2.15*  CALCIUM  8.5* 8.6*  --  7.6* 7.6* 7.7* 7.5* 7.5* 7.5*  MG 2.4 2.4  --  3.0* 2.5*  --   --  2.2  --    < > = values in this interval not displayed.   GFR: Estimated Creatinine Clearance: 26 mL/min (A) (by C-G formula based on SCr of 2.15 mg/dL (H)). Recent Labs  Lab 11/09/23 1600 11/10/23 0457 11/11/23 1409 11/13/23 0443  WBC 10.8* 9.6 8.0 5.8      Valinda Novas, MD [facility] See Amion for pager If no response to pager, please call [phone] until 7pm After 7pm, Please call E-link [phone]

## 2023-11-14 ENCOUNTER — Inpatient Hospital Stay (HOSPITAL_COMMUNITY)

## 2023-11-14 ENCOUNTER — Other Ambulatory Visit: Payer: Self-pay | Admitting: Cardiology

## 2023-11-14 DIAGNOSIS — R5381 Other malaise: Secondary | ICD-10-CM | POA: Diagnosis not present

## 2023-11-14 DIAGNOSIS — Z952 Presence of prosthetic heart valve: Secondary | ICD-10-CM

## 2023-11-14 DIAGNOSIS — Z953 Presence of xenogenic heart valve: Secondary | ICD-10-CM | POA: Diagnosis not present

## 2023-11-14 DIAGNOSIS — R0602 Shortness of breath: Secondary | ICD-10-CM | POA: Diagnosis not present

## 2023-11-14 LAB — CBC
HCT: 31.1 % — ABNORMAL LOW (ref 39.0–52.0)
Hemoglobin: 10.2 g/dL — ABNORMAL LOW (ref 13.0–17.0)
MCH: 29.3 pg (ref 26.0–34.0)
MCHC: 32.8 g/dL (ref 30.0–36.0)
MCV: 89.4 fL (ref 80.0–100.0)
Platelets: 104 K/uL — ABNORMAL LOW (ref 150–400)
RBC: 3.48 MIL/uL — ABNORMAL LOW (ref 4.22–5.81)
RDW: 17 % — ABNORMAL HIGH (ref 11.5–15.5)
WBC: 5.2 K/uL (ref 4.0–10.5)
nRBC: 0 % (ref 0.0–0.2)

## 2023-11-14 LAB — BASIC METABOLIC PANEL WITH GFR
Anion gap: 12 (ref 5–15)
BUN: 40 mg/dL — ABNORMAL HIGH (ref 8–23)
CO2: 22 mmol/L (ref 22–32)
Calcium: 7.6 mg/dL — ABNORMAL LOW (ref 8.9–10.3)
Chloride: 105 mmol/L (ref 98–111)
Creatinine, Ser: 2.09 mg/dL — ABNORMAL HIGH (ref 0.61–1.24)
GFR, Estimated: 32 mL/min — ABNORMAL LOW (ref >60)
Glucose, Bld: 142 mg/dL — ABNORMAL HIGH (ref 70–99)
Potassium: 4.2 mmol/L (ref 3.5–5.1)
Sodium: 139 mmol/L (ref 135–145)

## 2023-11-14 LAB — GLUCOSE, CAPILLARY
Glucose-Capillary: 135 mg/dL — ABNORMAL HIGH (ref 70–99)
Glucose-Capillary: 177 mg/dL — ABNORMAL HIGH (ref 70–99)
Glucose-Capillary: 180 mg/dL — ABNORMAL HIGH (ref 70–99)
Glucose-Capillary: 169 mg/dL — ABNORMAL HIGH (ref 70–99)

## 2023-11-14 LAB — MAGNESIUM: Magnesium: 2 mg/dL (ref 1.7–2.4)

## 2023-11-14 MED ORDER — METOPROLOL TARTRATE 25 MG PO TABS
37.5000 mg | ORAL_TABLET | Freq: Two times a day (BID) | ORAL | Status: DC
  Administered 2023-11-14: 37.5 mg via ORAL
  Filled 2023-11-14: qty 1

## 2023-11-14 MED ORDER — POLYETHYLENE GLYCOL 3350 17 G PO PACK
17.0000 g | PACK | Freq: Every day | ORAL | Status: DC
  Administered 2023-11-14: 17 g via ORAL
  Filled 2023-11-14 (×2): qty 1

## 2023-11-14 MED ORDER — AMLODIPINE BESYLATE 5 MG PO TABS
5.0000 mg | ORAL_TABLET | Freq: Every day | ORAL | Status: DC
  Administered 2023-11-14: 5 mg via ORAL
  Filled 2023-11-14: qty 1

## 2023-11-14 NOTE — Consult Note (Signed)
 Physical Medicine and Rehabilitation Consult Reason for Consult:debility Referring Physician: Su   HPI: Michael Bender is a 78 y.o. male with a history of chronic kidney disease, previous endocarditis with severe aortic insufficiency, A-fib who was admitted on 11/02/2023 with congestive heart failure.  Patient ultimately underwent aortic valve replacement on 11/08/2003 by Dr.Su.  Postoperatively the patient developed seizures involving her left side.  MRI of the brain was negative for stroke or other acute abnormalities.  Patient had no further seizure activity.  Patient was placed on Keppra.  Patient with persistent cognitive deficits however.  Hospital course complicated by acute on chronic kidney disease, paroxysmal atrial fibrillation with RVR.  Patient has been working with therapies and has been min assist most recently for sit to stand transfers and walked 325 feet min assist using rolling walker.  He has been a few days since Occupational Therapy has worked with him when he was min to mod assist for basic tasks.  Patient lives in an 1 level house with level entry by himself.  He has sons who can check in on him.  He ambulates using a cane or walking stick typically.   Home: Home Living Family/patient expects to be discharged to:: Private residence Living Arrangements: Alone Available Help at Discharge: Family, Available PRN/intermittently (sons) Type of Home: House Home Access: Level entry Home Layout: One level Bathroom Shower/Tub: Engineer, manufacturing systems: Standard Bathroom Accessibility: Yes Home Equipment: Grab bars - tub/shower, Grab bars - toilet, Hand held shower head, Other (comment), Rolling Walker (2 wheels), Cane - single point, BSC/3in1, Shower seat (walking stick)  Functional History: Prior Function Prior Level of Function : Independent/Modified Independent, Needs assist Mobility Comments: Ambulates using a walking stick. Denies falls in the last  73mo. ADLs Comments: Mod I to ind mostly., Sons help with finances and errnads/driving, pt manages own medications Functional Status:  Mobility: Bed Mobility Overal bed mobility: Needs Assistance Bed Mobility: Supine to Sit Supine to sit: Min assist, HOB elevated General bed mobility comments: Able to rise with CGA, cues for precautions, holding heart pillow. Min assist to scoot hips to EOB safely. Transfers Overall transfer level: Needs assistance Equipment used: Rolling walker (2 wheels) Transfers: Sit to/from Stand Sit to Stand: Min assist General transfer comment: Min assist for balance to rise and steady, slowly transitioned to RW with light support. Shows some instability with lateral lean initially but improved. Holds heart pillow to transition. Needs cues to release RW prior to sitting. Ambulation/Gait Ambulation/Gait assistance: Min assist Gait Distance (Feet): 325 Feet Assistive device: Rolling walker (2 wheels) Gait Pattern/deviations: Step-through pattern, Narrow base of support, Decreased stride length, Staggering right General Gait Details: Educated on safe AD use with transition to RW today. Intermittent cues for upright posture with forward gaze but maintains precautions well throughout duration. Pt with a couple of episodes of LOB requiring light min assist to correct, notably with sharp turns and initial steps. Cues for safety and awareness. VSS throughout on RA. SpO2 95%. Gait velocity: decreased Gait velocity interpretation: <1.8 ft/sec, indicate of risk for recurrent falls    ADL: ADL Overall ADL's : Needs assistance/impaired Eating/Feeding: Set up, Sitting Grooming: Set up, Supervision/safety, Sitting Upper Body Bathing: Set up, Supervision/ safety, Sitting Lower Body Bathing: Moderate assistance, Sit to/from stand Upper Body Dressing : Moderate assistance, Sitting Lower Body Dressing: Minimal assistance, Sit to/from stand Toilet Transfer: Minimal assistance,  Ambulation Toileting- Clothing Manipulation and Hygiene: Minimal assistance Functional mobility during ADLs: Minimal  assistance, +2 for safety/equipment, Rolling walker (2 wheels)  Cognition: Cognition Orientation Level: Oriented X4 Cognition Arousal: Alert Behavior During Therapy: WFL for tasks assessed/performed   Review of Systems  Constitutional:  Positive for malaise/fatigue.  HENT: Negative.    Eyes: Negative.   Respiratory:  Positive for shortness of breath.   Cardiovascular: Negative.   Gastrointestinal: Negative.   Genitourinary: Negative.   Musculoskeletal: Negative.   Skin: Negative.   Neurological:  Positive for weakness.  Psychiatric/Behavioral: Negative.     Past Medical History:  Diagnosis Date   Acute combined systolic and diastolic heart failure (HCC) 04/22/2023   Acute on chronic diastolic CHF (congestive heart failure) (HCC) 04/20/2023   AKI (acute kidney injury) 10/28/2022   Arthritis    Back   Balance problems 02/21/2023   Blood transfusion    as a child   Cachexia 05/03/2022   Added based on temporal wasting noted on exam, Body mass index is 19.92 kg/m. At 05/03/2022 encounter     Cervical discitis 11/10/2022   Chronic back pain    COPD (chronic obstructive pulmonary disease) (HCC)    Disturbance of skin sensation 05/22/2018   Effusion of right knee 02/21/2023   Effusion of right knee joint 01/16/2023   Elevated troponin 04/22/2023   Essential hypertension 03/05/2007   Qualifier: Diagnosis of   By: Krystal MD, Reyes LABOR      Finger pain, left 11/08/2022   Gastric erosion    Gastritis and gastroduodenitis    Gastrointestinal hemorrhage with melena 11/20/2018   GIB (gastrointestinal bleeding) 07/18/2019   Hardware complicating wound infection 11/10/2022   Headache(784.0)    last one 6 months ago   Hemorrhoids, internal    High risk medication use 03/03/2022   History of duodenal ulcer    History of fusion of cervical spine 03/03/2022   With  persistent cervical pain and palpable screws  Led to disability  History of attempt to dig furrow in skull to fix it  Last surgery 1992     History of lumbar fusion 03/03/2022   RE-OPERATIVE DIAGNOSIS:  lumbar stenosis synovial cyst lumbar spondylosis spondylolisthesis lumbar radiculoapthy L4/5   PROCEDURE:  Procedure(s): POSTERIOR LUMBAR FUSION 1 LEVEL with resection of synovial cyst     History of upper gastrointestinal bleeding 03/03/2022   Duodenal ulcer 2021 Dr. Avram   HLD (hyperlipidemia)    Hypertension    Hypokalemia 04/22/2023   Hypomagnesemia 04/22/2023   Infected blister of left index finger 11/07/2022   Intractable pain 03/03/2022   Loss of weight    Wt Readings from Last 10 Encounters:  04/05/22  146 lb (66.2 kg)  03/03/22  143 lb 9.6 oz (65.1 kg)  07/14/21  153 lb (69.4 kg)  12/14/20  147 lb 12.8 oz (67 kg)  11/13/19  154 lb (69.9 kg)  09/16/19  146 lb (66.2 kg)  08/06/19  146 lb (66.2 kg)  07/19/19  144 lb 13.5 oz (65.7 kg)  07/17/19  148 lb (67.1 kg)  07/09/19  147 lb 9.6 oz (67 kg)         MSSA bacteremia 11/07/2022   Neck rigidity    post cervical fusion   Nocturia    PAIN, CHRONIC NEC 10/06/2006   Qualifier: Diagnosis of   By: Krystal RN, Leeroy       Pneumonia    Prostate disease    Right sided temporal headache 05/22/2018   S/P cervical spinal fusion 03/03/2022   With persistent cervical pain and palpable screws  Led to disability History of attempt to dig furrow in skull to fix it Last surgery 1992   Senile ecchymosis 01/21/2019   Septic arthritis of wrist, left (HCC) 11/08/2022   Septic infrapatellar bursitis of right knee 11/07/2022   Staphylococcal Arthritis of Right Knee (Updated 01/30/2023) MRI 01/17/2023 shows worsening findings:  Worsening tear of posterior horn medial meniscus with large radial component Worsening subcortical stress fracture/osteochondral lesion of medial femoral condyle New subcortical stress fracture/osteochondral lesion of  medial tibial plateau Moderate-to-large effusion with worsened synovitis    Staphylococcal arthritis of left wrist (HCC) 11/07/2022   Staphylococcal arthritis of right knee (HCC) 11/08/2022   Syncope 11/05/2022   Underweight on examination 05/03/2022   Past Surgical History:  Procedure Laterality Date   AORTIC VALVE REPLACEMENT N/A 11/08/2023   Procedure: AORTIC VALVE REPLACEMENT USING A INSPIRIS VALVE.;  Surgeon: [name], MD;  Location: [facility] OR;  Service: Open Heart Surgery;  Laterality: N/A;   BIOPSY  07/19/2019   Procedure: BIOPSY;  Surgeon: San Sandor GAILS, DO;  Location: [facility];  Service: Gastroenterology;;   CERVICAL FUSION  1992   C2/C 3  four surgeries   CLIPPING OF ATRIAL APPENDAGE N/A 11/08/2023   Procedure: CLIPPING OF LEFT ATRIAL APPENDAGE USING A CLIP;  Surgeon: [name], MD;  Location: [facility] OR;  Service: Open Heart Surgery;  Laterality: N/A;   COLONOSCOPY     ESOPHAGOGASTRODUODENOSCOPY  07/20/2011   Procedure: ESOPHAGOGASTRODUODENOSCOPY (EGD);  Surgeon: Lupita FORBES Commander, MD;  Location: [facility];  Service: Endoscopy;  Laterality: N/A;   ESOPHAGOGASTRODUODENOSCOPY (EGD) WITH PROPOFOL  N/A 07/19/2019   Procedure: ESOPHAGOGASTRODUODENOSCOPY (EGD) WITH PROPOFOL ;  Surgeon: San Sandor GAILS, DO;  Location: [facility];  Service: Gastroenterology;  Laterality: N/A;   I & D EXTREMITY Left 11/09/2022   Procedure: IRRIGATION AND DEBRIDEMENT LEFT WRIST;  Surgeon: Arlinda Buster, MD;  Location: [facility] OR;  Service: Orthopedics;  Laterality: Left;   INTRAOPERATIVE TRANSESOPHAGEAL ECHOCARDIOGRAM N/A 11/08/2023   Procedure: ECHOCARDIOGRAM, TRANSESOPHAGEAL, INTRAOPERATIVE;  Surgeon: [name], MD;  Location: [facility];  Service: Open Heart Surgery;  Laterality: N/A;   RIGHT/LEFT HEART CATH AND CORONARY ANGIOGRAPHY N/A 11/06/2023   Procedure: RIGHT/LEFT HEART CATH AND CORONARY ANGIOGRAPHY;  Surgeon: Wonda Sharper, MD;  Location: [facility] INVASIVE CV LAB;  Service: Cardiovascular;   Laterality: N/A;   SAVORY DILATION  07/20/2011   Procedure: SAVORY DILATION;  Surgeon: Lupita FORBES Commander, MD;  Location: [facility];  Service: Endoscopy;  Laterality: N/A;   SPINAL FUSION  05/06/11   TRANSESOPHAGEAL ECHOCARDIOGRAM (CATH LAB) N/A 07/24/2023   Procedure: TRANSESOPHAGEAL ECHOCARDIOGRAM;  Surgeon: Pietro Redell RAMAN, MD;  Location: [facility] INVASIVE CV LAB;  Service: Cardiovascular;  Laterality: N/A;   Family History  Problem Relation Age of Onset   Heart disease Mother    Heart attack Mother 41   Pancreatic cancer Father 36   Pancreatic cancer Brother    Anesthesia problems Neg Hx    Colon cancer Neg Hx    Liver cancer Neg Hx    Stomach cancer Neg Hx    Esophageal cancer Neg Hx    Rectal cancer Neg Hx    Social History:  reports that he quit smoking about 4 years ago. His smoking use included cigarettes. He started smoking about 44 years ago. He has never used smokeless tobacco. He reports that he does not currently use alcohol after a past usage of about 2.0 standard drinks of alcohol per week. He reports that he does not use drugs.  Allergies:  Allergies  Allergen Reactions   Nubain [Nalbuphine Hcl]     Muscle contraction   Medications Prior to Admission  Medication Sig Dispense Refill   amitriptyline  (ELAVIL ) 50 MG tablet Take 1 tablet (50 mg total) by mouth at bedtime. 90 tablet 3   amLODipine  (NORVASC ) 5 MG tablet Take 1 tablet (5 mg total) by mouth daily. 30 tablet 0   bisoprolol  (ZEBETA ) 5 MG tablet Take 1 tablet (5 mg total) by mouth daily. 90 tablet 1   cyclobenzaprine  (FLEXERIL ) 10 MG tablet Take 1 tablet (10 mg total) by mouth 3 (three) times daily as needed. 270 tablet 3   ipratropium-albuterol  (DUONEB) 0.5-2.5 (3) MG/3ML SOLN Take 3 mLs by nebulization every 4 (four) hours as needed. 90 mL 1   Melatonin 1 MG CAPS Take 1 capsule (1 mg total) by mouth at bedtime. 90 capsule 30   metoprolol  succinate (TOPROL -XL) 25 MG 24 hr tablet Take 25 mg by mouth daily.      ondansetron  (ZOFRAN -ODT) 4 MG disintegrating tablet Take 1 tablet (4 mg total) by mouth every 8 (eight) hours as needed for nausea or vomiting. 60 tablet 2   Oxycodone  HCl 10 MG TABS Take 1 tablet (10 mg total) by mouth every 8 (eight) hours as needed. 90 tablet 0   pantoprazole  (PROTONIX ) 40 MG tablet Take 1 tablet (40 mg total) by mouth daily. 90 tablet 3   polyethylene glycol (MIRALAX  / GLYCOLAX ) 17 g packet Take 17 g by mouth daily as needed for moderate constipation. 14 each 0   rOPINIRole  (REQUIP ) 0.25 MG tablet Take 1 tablet (0.25 mg total) by mouth 3 (three) times daily. (Patient taking differently: Take 0.25 mg by mouth 3 (three) times daily as needed (restless leg).) 30 tablet 2   rosuvastatin  (CRESTOR ) 40 MG tablet TAKE 1 TABLET BY MOUTH DAILY. REPLACES ATORVASTATIN  (STOP ATORVASTATIN  IF STILL TAKING) 90 tablet 3   sacubitril -valsartan  (ENTRESTO ) 24-26 MG Take 1 tablet by mouth 2 (two) times daily. (Patient taking differently: Take 0.5 tablets by mouth daily.) 180 tablet 3   Semaglutide ,0.25 or 0.5MG /DOS, (OZEMPIC , 0.25 OR 0.5 MG/DOSE,) 2 MG/3ML SOPN Inject 0.25 mg into the skin once a week. 3 mL 5   spironolactone  (ALDACTONE ) 25 MG tablet Take 25 mg by mouth daily.     SUMAtriptan  (IMITREX ) 50 MG tablet TAKE 1 TABLET (50 MG TOTAL) BY MOUTH DAILY. MAY REPEAT IN 2 HOURS IF HEADACHE PERSISTS OR RECURS. 9 tablet 1   bisacodyl  5 MG EC tablet Take 1 tablet (5 mg total) by mouth daily as needed for moderate constipation. (Patient not taking: Reported on 11/02/2023) 30 tablet 0   Blood Glucose Monitoring Suppl DEVI 1 each by Does not apply route in the morning, at noon, and at bedtime. May substitute to any manufacturer covered by patient's insurance. 1 each 0   cefadroxil  (DURICEF) 500 MG capsule Take 500 mg by mouth 2 (two) times daily. (Patient not taking: Reported on 11/02/2023)     Continuous Glucose Sensor (FREESTYLE LIBRE 3 PLUS SENSOR) MISC Change sensor every 15 days. 2 each 11    FeFum-FePoly-FA-B Cmp-C-Biot (INTEGRA PLUS ) CAPS Take 1 capsule by mouth daily. (Patient not taking: Reported on 11/02/2023) 30 capsule 5   [EXPIRED] Glucose Blood (BLOOD GLUCOSE TEST STRIPS) STRP 1 each by In Vitro route in the morning, at noon, and at bedtime. May substitute to any manufacturer covered by patient's insurance. 100 strip 0   hydrALAZINE  (APRESOLINE ) 50 MG tablet Take 1 tablet (50  mg total) by mouth every 8 (eight) hours. (Patient not taking: Reported on 11/02/2023) 90 tablet 0   isosorbide  mononitrate (IMDUR ) 60 MG 24 hr tablet Take 1 tablet (60 mg total) by mouth daily. (Patient not taking: Reported on 11/02/2023) 30 tablet 0   JARDIANCE  10 MG TABS tablet Take 10 mg by mouth every morning. (Patient not taking: Reported on 11/02/2023)     [EXPIRED] Lancet Device MISC 1 each by Does not apply route in the morning, at noon, and at bedtime. May substitute to any manufacturer covered by patient's insurance. 1 each 0   [EXPIRED] Lancets Misc. MISC 1 each by Does not apply route in the morning, at noon, and at bedtime. May substitute to any manufacturer covered by patient's insurance. 100 each 0   predniSONE (DELTASONE) 10 MG tablet Take 10 mg by mouth 3 (three) times daily. (Patient not taking: Reported on 11/02/2023)     promethazine  (PHENERGAN ) 25 MG suppository Place 1 suppository (25 mg total) rectally every 6 (six) hours as needed for nausea or vomiting. (Patient not taking: Reported on 11/02/2023) 12 each 0   Respiratory Therapy Supplies (NEBULIZER/TUBING/MOUTHPIECE) KIT 1 each by Does not apply route every 4 (four) hours as needed. 1 kit 1   Respiratory Therapy Supplies (NEBULIZER/TUBING/MOUTHPIECE) KIT 1 each by Does not apply route every 4 (four) hours as needed. 1 kit 1   sodium zirconium cyclosilicate  (LOKELMA ) 10 g PACK packet Take 10 g by mouth daily. (Patient not taking: Reported on 11/02/2023) 7 packet 0     Blood pressure (!) 149/77, pulse 90, temperature 98 F (36.7 C),  temperature source Axillary, resp. rate 19, height 5' 11 (1.803 m), weight 65.3 kg, SpO2 94%. Physical Exam Constitutional:      Appearance: He is ill-appearing.  HENT:     Head: Normocephalic.     Nose: Nose normal.     Mouth/Throat:     Mouth: Mucous membranes are moist.  Eyes:     Pupils: Pupils are equal, round, and reactive to light.  Cardiovascular:     Rate and Rhythm: Normal rate.  Pulmonary:     Effort: Pulmonary effort is normal.  Abdominal:     Palpations: Abdomen is soft.  Musculoskeletal:        General: No swelling or tenderness.     Cervical back: Normal range of motion.  Skin:    Findings: Bruising present.     Comments: Sternal incision CDI  Neurological:     Mental Status: He is alert.     Comments: Alert and oriented x 3. Normal insight and awareness. Intact Memory. Normal language and speech. Cranial nerve exam unremarkable. MMT: BUE 4- delt, biceps, and triceps and 4/5 wrist and HI. BLE 4- HF, KE and 4/5 ADF/PF. Sensory exam normal for light touch and pain in all 4 limbs. No limb ataxia or cerebellar signs. No abnormal tone appreciated.  SABRA    Psychiatric:        Mood and Affect: Mood normal.        Behavior: Behavior normal.     Results for orders placed or performed during the hospital encounter of 11/01/23 (from the past 24 hours)  Glucose, capillary     Status: None   Collection Time: 11/13/23 11:24 AM  Result Value Ref Range   Glucose-Capillary 94 70 - 99 mg/dL  Glucose, capillary     Status: Abnormal   Collection Time: 11/13/23  4:08 PM  Result Value Ref Range   Glucose-Capillary  166 (H) 70 - 99 mg/dL  Basic metabolic panel with GFR     Status: Abnormal   Collection Time: 11/14/23  4:12 AM  Result Value Ref Range   Sodium 139 135 - 145 mmol/L   Potassium 4.2 3.5 - 5.1 mmol/L   Chloride 105 98 - 111 mmol/L   CO2 22 22 - 32 mmol/L   Glucose, Bld 142 (H) 70 - 99 mg/dL   BUN 40 (H) 8 - 23 mg/dL   Creatinine, Ser 7.90 (H) 0.61 - 1.24 mg/dL    Calcium  7.6 (L) 8.9 - 10.3 mg/dL   GFR, Estimated 32 (L) >60 mL/min   Anion gap 12 5 - 15  Magnesium      Status: None   Collection Time: 11/14/23  4:12 AM  Result Value Ref Range   Magnesium  2.0 1.7 - 2.4 mg/dL  CBC     Status: Abnormal   Collection Time: 11/14/23  4:12 AM  Result Value Ref Range   WBC 5.2 4.0 - 10.5 K/uL   RBC 3.48 (L) 4.22 - 5.81 MIL/uL   Hemoglobin 10.2 (L) 13.0 - 17.0 g/dL   HCT 68.8 (L) 60.9 - 47.9 %   MCV 89.4 80.0 - 100.0 fL   MCH 29.3 26.0 - 34.0 pg   MCHC 32.8 30.0 - 36.0 g/dL   RDW 82.9 (H) 88.4 - 84.4 %   Platelets 104 (L) 150 - 400 K/uL   nRBC 0.0 0.0 - 0.2 %  Glucose, capillary     Status: Abnormal   Collection Time: 11/14/23  7:38 AM  Result Value Ref Range   Glucose-Capillary 135 (H) 70 - 99 mg/dL   DG Chest 2 View Result Date: 11/14/2023 CLINICAL DATA:  Status post cardiac surgery. EXAM: CHEST - 2 VIEW COMPARISON:  11/13/2023 FINDINGS: Lungs are hyperexpanded. No pneumothorax or substantial pleural effusion. No focal consolidation or evidence of pulmonary edema. The cardiopericardial silhouette is within normal limits for size. Right PICC line tip overlies the mid to lower SVC level. Telemetry leads overlie the chest. IMPRESSION: Hyperexpansion without acute cardiopulmonary findings. Electronically Signed   By: Camellia Candle M.D.   On: 11/14/2023 05:44   DG CHEST PORT 1 VIEW Result Date: 11/13/2023 CLINICAL DATA:  Status post aortic valve replacement. EXAM: PORTABLE CHEST 1 VIEW COMPARISON:  Prior chest x-ray 11/11/2023 FINDINGS: The mediastinal drains have been removed. Patient is status post median sternotomy with evidence of left atrial appendage ligation and aortic valve replacement. Stable cardiac and mediastinal contours. Extensive atherosclerotic calcifications throughout the aorta. Right upper extremity PICC in good position with the tip overlying the mid SVC. The lungs are clear. No edema, pleural effusion or pneumothorax. No acute osseous  abnormality. IMPRESSION: 1. Interval removal of mediastinal drains. 2. No acute cardiopulmonary process. 3. Well-positioned right upper extremity PICC with the catheter tip overlying the mid SVC. Electronically Signed   By: Wilkie Lent M.D.   On: 11/13/2023 09:41    Assessment/Plan: Diagnosis: 78 year old male with severe aortic insufficiency status post aortic valve replacement.  Patient with postoperative seizures and encephalopathy Does the need for close, 24 hr/day medical supervision in concert with the patient's rehab needs make it unreasonable for this patient to be served in a less intensive setting? Yes Co-Morbidities requiring supervision/potential complications:  -Seizure disorder -Postoperative anemia and thrombocytopenia -Acute on chronic kidney disease -Paroxysmal atrial fibrillation with RVR -Chronic heart failure -Diabetes -Hypertension Due to bladder management, bowel management, safety, skin/wound care, disease management, medication administration, pain management, and  patient education, does the patient require 24 hr/day rehab nursing? Yes Does the patient require coordinated care of a physician, rehab nurse, therapy disciplines of PT, OT to address physical and functional deficits in the context of the above medical diagnosis(es)? Yes Addressing deficits in the following areas: balance, endurance, locomotion, strength, transferring, bowel/bladder control, bathing, dressing, feeding, grooming, toileting, and psychosocial support Can the patient actively participate in an intensive therapy program of at least 3 hrs of therapy per day at least 5 days per week? Yes The potential for patient to make measurable gains while on inpatient rehab is excellent Anticipated functional outcomes upon discharge from inpatient rehab are modified independent  with PT, modified independent with OT, n/a with SLP. Estimated rehab length of stay to reach the above functional goals is: 7  days Anticipated discharge destination: Home Overall Rehab/Functional Prognosis: excellent  POST ACUTE RECOMMENDATIONS: This patient's condition is appropriate for continued rehabilitative care in the following setting: CIR Patient has agreed to participate in recommended program. Yes Note that insurance prior authorization may be required for reimbursement for recommended care.  Comment: Pt needs to be mod I to return home. Son can check in on him only. Rehab Admissions Coordinator to follow up.     I have personally performed a face to face diagnostic evaluation of this patient. Additionally, I have examined the patient's medical record including any pertinent labs and radiographic images.    Thanks,  Arthea ONEIDA Gunther, MD 11/14/2023

## 2023-11-14 NOTE — PMR Pre-admission (Signed)
 PMR Admission Coordinator Pre-Admission Assessment  Patient: Michael Bender is an 78 y.o., male MRN: [id number] DOB: 1945/06/08 Height: 5' 11 (180.3 cm) Weight: 65.3 kg  Insurance Information HMO: yes    PPO: yes     PCP:      IPA:      80/20:      OTHER:  PRIMARY: [employer] Medicare HMO/PPO      Policy#: [id number]       Subscriber: Pt CM Name: Annabella      Phone#: [phone]    Fax#: [phone] Pre-Cert#: [id number]     I received auth for CIR from Tiffany  with [facility]  for admit 10/15 through 10/21/.  Updates due to Tiffany at fax listed above.  Employer:  Benefits:  Phone #:      Name: sophia Gemma Date: 02/01/2023 - still active Deductible: does not have OOP Max: $4,150 ($2,340.21 met)  CIR: $300/day co-pay with a max co-pay of $1,800/admission (6 days) SNF: $10/day co-pay for days 1-20, $214/day co-pay for days 21-100; limited to 100 days/cal yr. Outpatient:  $10 copay/visit Home Health:  100% coverage DME: 80% coverage; 20% co-insurance Providers: in network   SECONDARY:       Policy#:      Phone#:   Artist:       Phone#:   The Data processing manager" for patients in Inpatient Rehabilitation Facilities with attached "Privacy Act Statement-Health Care Records" was provided and verbally reviewed with: Patient  Emergency Contact Information Contact Information     Name Relation Home Work Mobile   [place] Son   [phone]   Jaden, Abreu [phone]  [phone]      Other Contacts   None on File     Current Medical History  Patient Admitting Diagnosis: Severe Aortic Regurgitation s/p AVR History of Present Illness: [name] is a 78 y.o. male with past medical history  of  hypertension, hyperlipidemia, GI bleeding due to duodenal ulcer, peripheral vascular disease, smoking and COPD, combined systolic and diastolic heart failure, degenerative spine disease status post multiple surgeries, multiple falls with  traumatic rhabdomyolysis and acute kidney failure,mild ascending aortic dilation  who presented to [facility] 11/01/23 with severe aortic regurgitation. Pt was seen by CT Sx on  08/30/2023 with plan for  open surgical aortic valve replacement as pt is not a candidate for TAVR.  In the ED Initial CMP showed 132 sodium bicarb of 18 anion gap 16 glucose 126 BUN 48 creatinine of 3.0. Normal LFTs.BNP of 634, troponin of 67 and repeated at 62.Lactic acid of 1.1. CBC shows white count of 8.4 hemoglobin of 13.1 platelets 146.  Neck Viral panel negative for flu RSV and COVID. Abnormal chest x-ray showing possible small nodular opacity over the right mid to lower lung with recommended chest x-ray in 3 months along with noncontrast chest CT.Cardiac MRI suggestive of severe AI. TEE suggestive of moderate-severe AI. Pt. Underwent AVR with clipping of atrial appendage 11/08/23. Post operatively, Pt had a seizure with Left arm and L leg and L face twitching with L gaze preference. On 11/10/23 Pt. Developed Afib with RVR. He was seen by PT/OT and they recommend CIR to assist return to PLOF.    Complete NIHSS TOTAL: 11  Patient's medical record from [facility] has been reviewed by the rehabilitation admission coordinator and physician.  Past Medical History  Past Medical History:  Diagnosis Date   Acute combined systolic and diastolic heart failure (  HCC) 04/22/2023   Acute on chronic diastolic CHF (congestive heart failure) (HCC) 04/20/2023   AKI (acute kidney injury) 10/28/2022   Arthritis    Back   Balance problems 02/21/2023   Blood transfusion    as a child   Cachexia 05/03/2022   Added based on temporal wasting noted on exam, Body mass index is 19.92 kg/m. At 05/03/2022 encounter     Cervical discitis 11/10/2022   Chronic back pain    COPD (chronic obstructive pulmonary disease) (HCC)    Disturbance of skin sensation 05/22/2018   Effusion of right knee 02/21/2023    Effusion of right knee joint 01/16/2023   Elevated troponin 04/22/2023   Essential hypertension 03/05/2007   Qualifier: Diagnosis of   By: Krystal MD, Reyes LABOR      Finger pain, left 11/08/2022   Gastric erosion    Gastritis and gastroduodenitis    Gastrointestinal hemorrhage with melena 11/20/2018   GIB (gastrointestinal bleeding) 07/18/2019   Hardware complicating wound infection 11/10/2022   Headache(784.0)    last one 6 months ago   Hemorrhoids, internal    High risk medication use 03/03/2022   History of duodenal ulcer    History of fusion of cervical spine 03/03/2022   With persistent cervical pain and palpable screws  Led to disability  History of attempt to dig furrow in skull to fix it  Last surgery 1992     History of lumbar fusion 03/03/2022   RE-OPERATIVE DIAGNOSIS:  lumbar stenosis synovial cyst lumbar spondylosis spondylolisthesis lumbar radiculoapthy L4/5   PROCEDURE:  Procedure(s): POSTERIOR LUMBAR FUSION 1 LEVEL with resection of synovial cyst     History of upper gastrointestinal bleeding 03/03/2022   Duodenal ulcer 2021 Dr. Avram   HLD (hyperlipidemia)    Hypertension    Hypokalemia 04/22/2023   Hypomagnesemia 04/22/2023   Infected blister of left index finger 11/07/2022   Intractable pain 03/03/2022   Loss of weight    Wt Readings from Last 10 Encounters:  04/05/22  146 lb (66.2 kg)  03/03/22  143 lb 9.6 oz (65.1 kg)  07/14/21  153 lb (69.4 kg)  12/14/20  147 lb 12.8 oz (67 kg)  11/13/19  154 lb (69.9 kg)  09/16/19  146 lb (66.2 kg)  08/06/19  146 lb (66.2 kg)  07/19/19  144 lb 13.5 oz (65.7 kg)  07/17/19  148 lb (67.1 kg)  07/09/19  147 lb 9.6 oz (67 kg)         MSSA bacteremia 11/07/2022   Neck rigidity    post cervical fusion   Nocturia    PAIN, CHRONIC NEC 10/06/2006   Qualifier: Diagnosis of   By: Krystal RN, Leeroy       Pneumonia    Prostate disease    Right sided temporal headache 05/22/2018   S/P cervical spinal fusion 03/03/2022   With persistent  cervical pain and palpable screws Led to disability History of attempt to dig furrow in skull to fix it Last surgery 1992   Senile ecchymosis 01/21/2019   Septic arthritis of wrist, left (HCC) 11/08/2022   Septic infrapatellar bursitis of right knee 11/07/2022   Staphylococcal Arthritis of Right Knee (Updated 01/30/2023) MRI 01/17/2023 shows worsening findings:  Worsening tear of posterior horn medial meniscus with large radial component Worsening subcortical stress fracture/osteochondral lesion of medial femoral condyle New subcortical stress fracture/osteochondral lesion of medial tibial plateau Moderate-to-large effusion with worsened synovitis    Staphylococcal arthritis of left wrist (HCC) 11/07/2022  Staphylococcal arthritis of right knee (HCC) 11/08/2022   Syncope 11/05/2022   Underweight on examination 05/03/2022    Has the patient had major surgery during 100 days prior to admission? Yes  Family History   family history includes Heart attack (age of onset: 90) in his mother; Heart disease in his mother; Pancreatic cancer in his brother; Pancreatic cancer (age of onset: 19) in his father.  Current Medications  Current Facility-Administered Medications:    0.9 %  sodium chloride  infusion, 250 mL, Intravenous, PRN, Su, Con RAMAN, MD   amiodarone (PACERONE) tablet 400 mg, 400 mg, Oral, BID, 400 mg at 11/14/23 0901 **FOLLOWED BY** [START ON 11/18/2023] amiodarone (PACERONE) tablet 200 mg, 200 mg, Oral, BID, Su, Con RAMAN, MD   amLODipine  (NORVASC ) tablet 5 mg, 5 mg, Oral, Daily, Su, Con RAMAN, MD, 5 mg at 11/14/23 9344   artificial tears ophthalmic solution 1 drop, 1 drop, Both Eyes, PRN, Su, Con RAMAN, MD, 1 drop at 11/05/23 2123   aspirin EC tablet 81 mg, 81 mg, Oral, Daily, 81 mg at 11/14/23 0901 **OR** [DISCONTINUED] aspirin chewable tablet 81 mg, 81 mg, Per Tube, Daily, Su, Con RAMAN, MD   bisacodyl  (DULCOLAX) EC tablet 10 mg, 10 mg, Oral, Daily, 10 mg at 11/14/23 0900 **OR**  [DISCONTINUED] bisacodyl  (DULCOLAX) suppository 10 mg, 10 mg, Rectal, Daily, Zimmerman, [name], PA-C   Chlorhexidine  Gluconate Cloth 2 % PADS 6 each, 6 each, Topical, Daily, Su, Con RAMAN, MD, 6 each at 11/13/23 1030   [facility], Patient & Family Education, , Does not apply, Once, Su, Con RAMAN, MD   dextrose  50 % solution 0-50 mL, 0-50 mL, Intravenous, PRN, Su, Con RAMAN, MD   docusate sodium  (COLACE) capsule 200 mg, 200 mg, Oral, Daily, Su, Bailey S, MD, 200 mg at 11/14/23 0901   empagliflozin  (JARDIANCE ) tablet 10 mg, 10 mg, Oral, Daily, Su, Bailey S, MD, 10 mg at 11/14/23 0900   feeding supplement (ENSURE PLUS HIGH PROTEIN) liquid 237 mL, 237 mL, Oral, BID BM, Su, Con RAMAN, MD, 237 mL at 11/13/23 1428   furosemide  (LASIX ) tablet 40 mg, 40 mg, Oral, Daily, Su, Con RAMAN, MD, 40 mg at 11/14/23 0900   heparin  injection 5,000 Units, 5,000 Units, Subcutaneous, Q8H, [name], MD, 5,000 Units at 11/14/23 9348   insulin  aspart (novoLOG ) injection 0-15 Units, 0-15 Units, Subcutaneous, TID WC, Su, Bailey S, MD, 2 Units at 11/14/23 9185   insulin  aspart (novoLOG ) injection 0-5 Units, 0-5 Units, Subcutaneous, QHS, Su, Con RAMAN, MD   insulin  glargine (LANTUS ) injection 5 Units, 5 Units, Subcutaneous, Daily, Su, Con RAMAN, MD, 5 Units at 11/14/23 9061   ipratropium-albuterol  (DUONEB) 0.5-2.5 (3) MG/3ML nebulizer solution 3 mL, 3 mL, Nebulization, Q4H PRN, Su, Con RAMAN, MD   levETIRAcetam (KEPPRA) tablet 500 mg, 500 mg, Oral, BID, Paytes, Emma U, RPH, 500 mg at 11/14/23 0900   melatonin tablet 3 mg, 3 mg, Oral, QHS, Su, Bailey S, MD, 3 mg at 11/13/23 2233   metoprolol  tartrate (LOPRESSOR ) injection 2.5-5 mg, 2.5-5 mg, Intravenous, Q2H PRN, [name], MD, 5 mg at 11/09/23 1946   metoprolol  tartrate (LOPRESSOR ) tablet 25 mg, 25 mg, Oral, BID, 25 mg at 11/14/23 0900 **OR** [DISCONTINUED] metoprolol  tartrate (LOPRESSOR ) 25 mg/10 mL oral suspension 25 mg, 25 mg, Per Tube, BID, Su, Con RAMAN,  MD   multivitamin with minerals tablet 1 tablet, 1 tablet, Oral, Daily, [name], MD, 1 tablet at 11/14/23 0900   ondansetron  (  ZOFRAN ) injection 4 mg, 4 mg, Intravenous, Q6H PRN, [name], MD, 4 mg at 11/11/23 0818   Oral care mouth rinse, 15 mL, Mouth Rinse, PRN, Su, Con RAMAN, MD   oxyCODONE  (Oxy IR/ROXICODONE ) immediate release tablet 5-10 mg, 5-10 mg, Oral, Q3H PRN, [name], MD, 5 mg at 11/11/23 1549   pantoprazole  (PROTONIX ) EC tablet 40 mg, 40 mg, Oral, Daily, Su, Con RAMAN, MD, 40 mg at 11/14/23 0900   polyethylene glycol (MIRALAX  / GLYCOLAX ) packet 17 g, 17 g, Oral, Daily, Su, Con RAMAN, MD, 17 g at 11/14/23 9060   prochlorperazine  (COMPAZINE ) injection 10 mg, 10 mg, Intravenous, Q6H PRN, Su, Con RAMAN, MD   rOPINIRole  (REQUIP ) tablet 0.25 mg, 0.25 mg, Oral, TID PRN, [name], MD   rosuvastatin  (CRESTOR ) tablet 40 mg, 40 mg, Oral, Daily, Su, Bailey S, MD, 40 mg at 11/14/23 0901   sodium chloride  flush (NS) 0.9 % injection 3 mL, 3 mL, Intravenous, Q12H, Su, Con RAMAN, MD   sodium chloride  flush (NS) 0.9 % injection 3 mL, 3 mL, Intravenous, PRN, Su, Con RAMAN, MD   SUMAtriptan  (IMITREX ) tablet 50 mg, 50 mg, Oral, Daily PRN, [name], MD, 50 mg at 11/11/23 1332  Patients Current Diet:  Diet Order             Diet Carb Modified Fluid consistency: Thin; Room service appropriate? Yes  Diet effective now                   Precautions / Restrictions Precautions Precautions: Fall, Sternal Restrictions Weight Bearing Restrictions Per Provider Order: Yes RUE Weight Bearing Per Provider Order: Non weight bearing LUE Weight Bearing Per Provider Order: Non weight bearing Other Position/Activity Restrictions: sternal   Has the patient had 2 or more falls or a fall with injury in the past year? No  Prior Activity Level Community (5-7x/wk): Pt. active in the community PTA  Prior Functional Level Self Care: Did the patient need help bathing, dressing, using the toilet  or eating? Independent  Indoor Mobility: Did the patient need assistance with walking from room to room (with or without device)? Independent  Stairs: Did the patient need assistance with internal or external stairs (with or without device)? Independent  Functional Cognition: Did the patient need help planning regular tasks such as shopping or remembering to take medications? Independent  Patient Information Are you of Hispanic, Latino/a,or Spanish origin?: A. No, not of Hispanic, Latino/a, or Spanish origin What is your race?: A. White Do you need or want an interpreter to communicate with a doctor or health care staff?: 0. No  Patient's Response To:  Health Literacy and Transportation Is the patient able to respond to health literacy and transportation needs?: Yes Health Literacy - How often do you need to have someone help you when you read instructions, pamphlets, or other written material from your doctor or pharmacy?: Never In the past 12 months, has lack of transportation kept you from medical appointments or from getting medications?: No In the past 12 months, has lack of transportation kept you from meetings, work, or from getting things needed for daily living?: No  Home Assistive Devices / Equipment Home Equipment: Grab bars - tub/shower, Grab bars - toilet, Hand held shower head, Other (comment), Rolling Walker (2 wheels), Cane - single point, BSC/3in1, Shower seat (walking stick)  Prior Device Use: Indicate devices/aids used by the patient prior to current illness, exacerbation or injury? None of the above  Current Functional Level Cognition  Orientation Level: Oriented X4    Extremity Assessment (includes Sensation/Coordination)  Upper Extremity Assessment: Generalized weakness  Lower Extremity Assessment: Defer to PT evaluation    ADLs  Overall ADL's : Needs assistance/impaired Eating/Feeding: Set up, Sitting Grooming: Set up, Standing, Oral care Upper Body  Bathing: Set up, Supervision/ safety, Sitting Lower Body Bathing: Moderate assistance, Sit to/from stand Upper Body Dressing : Sitting, Moderate assistance Lower Body Dressing: Minimal assistance, Sit to/from stand Toilet Transfer: Contact guard assist, Ambulation, Rolling walker (2 wheels), Regular Toilet Toileting- Clothing Manipulation and Hygiene: Minimal assistance Functional mobility during ADLs: Contact guard assist, Rolling walker (2 wheels)    Mobility  Overal bed mobility: Needs Assistance Bed Mobility: Supine to Sit Supine to sit: Contact guard General bed mobility comments: use of heart pillow    Transfers  Overall transfer level: Needs assistance Equipment used: Rolling walker (2 wheels) Transfers: Sit to/from Stand Sit to Stand: Contact guard assist General transfer comment: Min assist for balance to rise and steady, slowly transitioned to RW with light support. Shows some instability with lateral lean initially but improved. Holds heart pillow to transition. Needs cues to release RW prior to sitting.    Ambulation / Gait / Stairs / Wheelchair Mobility  Ambulation/Gait Ambulation/Gait assistance: Editor, commissioning (Feet): 325 Feet Assistive device: Rolling walker (2 wheels) Gait Pattern/deviations: Step-through pattern, Narrow base of support, Decreased stride length, Staggering right General Gait Details: Educated on safe AD use with transition to RW today. Intermittent cues for upright posture with forward gaze but maintains precautions well throughout duration. Pt with a couple of episodes of LOB requiring light min assist to correct, notably with sharp turns and initial steps. Cues for safety and awareness. VSS throughout on RA. SpO2 95%. Gait velocity: decreased Gait velocity interpretation: <1.8 ft/sec, indicate of risk for recurrent falls    Posture / Balance Balance Overall balance assessment: Needs assistance Sitting-balance support: No upper extremity  supported, Feet supported Sitting balance-Leahy Scale: Good Standing balance support: No upper extremity supported Standing balance-Leahy Scale: Fair Standing balance comment: static stands at sink with minimal unilateral UE support    Special considerations/life events  Special service needs none   Previous Home Environment (from acute therapy documentation) Living Arrangements: Alone Available Help at Discharge: Family, Available PRN/intermittently (sons) Type of Home: House Home Layout: One level Home Access: Level entry Bathroom Shower/Tub: Engineer, manufacturing systems: Standard Bathroom Accessibility: Yes How Accessible: Accessible via walker Home Care Services: No  Discharge Living Setting Plans for Discharge Living Setting: Patient's home Type of Home at Discharge: House Discharge Home Layout: One level Discharge Home Access: Level entry Discharge Bathroom Shower/Tub: Tub/shower unit Discharge Bathroom Toilet: Standard Discharge Bathroom Accessibility: Yes How Accessible: Accessible via walker Does the patient have any problems obtaining your medications?: No  Social/Family/Support Systems Patient Roles: Other (Comment) Contact Information: [phone] Anticipated Caregiver: Sons can check on him daily, needs mod I goals Caregiver Availability: 24/7 Discharge Plan Discussed with Primary Caregiver: Yes Is Caregiver In Agreement with Plan?: Yes Does Caregiver/Family have Issues with Lodging/Transportation while Pt is in Rehab?: No  Goals Patient/Family Goal for Rehab: PT/OT Mod I Expected length of stay: 7-10 days Pt/Family Agrees to Admission and willing to participate: Yes Program Orientation Provided & Reviewed with Pt/Caregiver Including Roles  & Responsibilities: Yes  Decrease burden of Care through IP rehab admission: Not anticipated  Possible need for SNF placement upon discharge: Not anticipated  Patient Condition: I have reviewed  medical records  from [facility] , spoken with CM, and patient. I met with patient at the bedside for inpatient rehabilitation assessment.  Patient will benefit from ongoing PT and OT, can actively participate in 3 hours of therapy a day 5 days of the week, and can make measurable gains during the admission.  Patient will also benefit from the coordinated team approach during an Inpatient Acute Rehabilitation admission.  The patient will receive intensive therapy as well as Rehabilitation physician, nursing, social worker, and care management interventions.  Due to safety, skin/wound care, disease management, medication administration, pain management, and patient education the patient requires 24 hour a day rehabilitation nursing.  The patient is currently Min A to CGA with mobility and basic ADLs.  Discharge setting and therapy post discharge at home with home health is anticipated.  Patient has agreed to participate in the Acute Inpatient Rehabilitation Program and will admit tomorrow.  Preadmission Screen Completed By:  Leita KATHEE Kleine, 11/14/2023 9:40 AM ______________________________________________________________________   Discussed status with Dr. Urbano on 11/15/23 at 1014 and received approval for admission today.  Admission Coordinator:  Leita KATHEE Kleine, CCC-SLP, time 1015/Date 11/15/23   Assessment/Plan: Diagnosis: severe aortic insufficiency S/P aortic valve replacement. Patient had postoperative seizures and encephalopathy  Does the need for close, 24 hr/day Medical supervision in concert with the patient's rehab needs make it unreasonable for this patient to be served in a less intensive setting? Yes Co-Morbidities requiring supervision/potential complications: Seizure disorder, anemia, thrombocytopenia, CKD, A-fib, diabetes, chronic heart failure, hypertension, COPD Due to bladder management, bowel management, safety, skin/wound care, disease management, medication administration,  pain management, and patient education, does the patient require 24 hr/day rehab nursing? Yes Does the patient require coordinated care of a physician, rehab nurse, PT, OT, and SLP to address physical and functional deficits in the context of the above medical diagnosis(es)? Yes Addressing deficits in the following areas: balance, endurance, locomotion, strength, transferring, bowel/bladder control, bathing, dressing, feeding, grooming, toileting, and psychosocial support Can the patient actively participate in an intensive therapy program of at least 3 hrs of therapy 5 days a week? Yes The potential for patient to make measurable gains while on inpatient rehab is excellent Anticipated functional outcomes upon discharge from inpatient rehab: modified independent PT, modified independent OT, modified independent and n/a SLP Estimated rehab length of stay to reach the above functional goals is: 7-10 Anticipated discharge destination: Home 10. Overall Rehab/Functional Prognosis: excellent   MD Signature: Murray Urbano

## 2023-11-14 NOTE — Progress Notes (Signed)
 Pt arrived from ...2H.., A/ox .4.Marland Kitchenpt denies any pain, MD aware,CCMD called. CHG bath given,no further needs at this time

## 2023-11-14 NOTE — Progress Notes (Signed)
 Post op echo ordered

## 2023-11-14 NOTE — Progress Notes (Signed)
 Occupational Therapy Treatment Patient Details Name: Michael Bender MRN: [id number] DOB: [birth year] Today's Date: 11/14/2023   History of present illness 78 yo male s/p AVR with clipping of atrial appendage 10/8. New onset seizure with  Left arm and L leg and L face twitching with L gaze post op. 10/10 Afib with RVR. PMH: peripheral vascular disease, hypertension, hyperlipidemia, history of orthostatic hypotension, GERD, multiple falls, traumatic rhabdomyolysis, AKI, acute urinary retention with bladder outlet obstruction, BPH.   OT comments  Pt progressing toward goals, needs mod cues for sternal precautions during session and pt able to recall 1 sternal prec when asked, handout provided and reviewed but will benefit from reinforcement. Pt needing mod A for UB dressing task, set up for standing oral care, CGA for bed mobility and transfers with RW. Pt with mild dizziness and attempting to abandon RW at end of session when getting close to chair. HR up to 130bpm with activity. Pt presenting with impairments listed below, will follow acutely. Patient will benefit from intensive inpatient follow-up therapy, >3 hours/day.       If plan is discharge home, recommend the following:  A little help with walking and/or transfers;A little help with bathing/dressing/bathroom;Direct supervision/assist for medications management;Direct supervision/assist for financial management;Assist for transportation   Equipment Recommendations  Other (comment) (defer)    Recommendations for Other Services Rehab consult    Precautions / Restrictions Precautions Precautions: Fall;Sternal Recall of Precautions/Restrictions: Impaired       Mobility Bed Mobility Overal bed mobility: Needs Assistance Bed Mobility: Supine to Sit     Supine to sit: Contact guard     General bed mobility comments: use of heart pillow    Transfers Overall transfer level: Needs assistance Equipment used: Rolling walker (2  wheels) Transfers: Sit to/from Stand Sit to Stand: Contact guard assist                 Balance Overall balance assessment: Needs assistance Sitting-balance support: No upper extremity supported, Feet supported Sitting balance-Leahy Scale: Good     Standing balance support: No upper extremity supported Standing balance-Leahy Scale: Fair Standing balance comment: static stands at sink with minimal unilateral UE support                           ADL either performed or assessed with clinical judgement   ADL Overall ADL's : Needs assistance/impaired     Grooming: Set up;Standing;Oral care           Upper Body Dressing : Sitting;Moderate assistance       Toilet Transfer: Contact guard assist;Ambulation;Rolling walker (2 wheels);Regular Toilet           Functional mobility during ADLs: Contact guard assist;Rolling walker (2 wheels)      Extremity/Trunk Assessment Upper Extremity Assessment Upper Extremity Assessment: Generalized weakness   Lower Extremity Assessment Lower Extremity Assessment: Defer to PT evaluation        Vision   Vision Assessment?: Wears glasses for reading   Perception Perception Perception: Not tested   Praxis Praxis Praxis: Not tested   Communication Communication Communication: Impaired Factors Affecting Communication: Hearing impaired   Cognition Arousal: Alert Behavior During Therapy: WFL for tasks assessed/performed Cognition: Cognition impaired, No family/caregiver present to determine baseline     Awareness: Online awareness impaired, Intellectual awareness intact Memory impairment (select all impairments): Working [employer] fast streamer, Conservation officer, historic buildings, Short-term memory Attention impairment (select first level of impairment): Selective attention   OT -  Cognition Comments: decr awareness of deficits/safety throughout session, verbalizes 1 sternal precauiton but needs cues to adhere throughout                  Following commands: Impaired Following commands impaired: Follows multi-step commands inconsistently      Cueing   Cueing Techniques: Verbal cues, Gestural cues  Exercises      Shoulder Instructions       General Comments VSS on RA    Pertinent Vitals/ Pain       Pain Assessment Pain Assessment: No/denies pain  Home Living                                          Prior Functioning/Environment              Frequency  Min 2X/week        Progress Toward Goals  OT Goals(current goals can now be found in the care plan section)  Progress towards OT goals: Progressing toward goals  Acute Rehab OT Goals Patient Stated Goal: to shower OT Goal Formulation: With patient Time For Goal Achievement: 11/24/23 Potential to Achieve Goals: Good ADL Goals Pt Will Perform Upper Body Bathing: with supervision;sitting Pt Will Perform Lower Body Bathing: with supervision;sit to/from stand Pt Will Perform Upper Body Dressing: with supervision;sitting Pt Will Transfer to Toilet: with supervision;ambulating Additional ADL Goal #1: Pt will complete ADL task with min redirectional cues for sternal precautions  Plan      Co-evaluation                 AM-PAC OT 6 Clicks Daily Activity     Outcome Measure   Help from another person eating meals?: A Little Help from another person taking care of personal grooming?: A Little Help from another person toileting, which includes using toliet, bedpan, or urinal?: A Little Help from another person bathing (including washing, rinsing, drying)?: A Lot Help from another person to put on and taking off regular upper body clothing?: A Lot Help from another person to put on and taking off regular lower body clothing?: A Little 6 Click Score: 16    End of Session Equipment Utilized During Treatment: Rolling walker (2 wheels);Gait belt  OT Visit Diagnosis: Unsteadiness on feet (R26.81);Other abnormalities  of gait and mobility (R26.89);Muscle weakness (generalized) (M62.81);Other symptoms and signs involving cognitive function   Activity Tolerance Patient tolerated treatment well   Patient Left in chair;with call bell/phone within reach;with chair alarm set   Nurse Communication Mobility status        Time: 9140-9082 OT Time Calculation (min): 18 min  Charges: OT General Charges $OT Visit: 1 Visit OT Treatments $Self Care/Home Management : 8-22 mins  Jull Harral K, OTD, OTR/L SecureChat Preferred Acute Rehab [phone]   Laneta POUR Koonce 11/14/2023, 9:28 AM

## 2023-11-14 NOTE — Progress Notes (Signed)
 6 Days Post-Op Procedure(s) (LRB): AORTIC VALVE REPLACEMENT USING A INSPIRIS VALVE. (N/A) CLIPPING OF LEFT ATRIAL APPENDAGE USING A CLIP (N/A) ECHOCARDIOGRAM, TRANSESOPHAGEAL, INTRAOPERATIVE (N/A) Subjective: Doing great, already walked this morning with RN Hypertensive overnight Remains in NSR on PO amio Renal function stable  Objective: Vital signs in last 24 hours: BP (!) 173/77   Pulse 82   Temp 98 F (36.7 C)   Resp 15   Ht 5' 11 (1.803 m)   Wt 65.3 kg   SpO2 96%   BMI 20.07 kg/m  Filed Weights   11/12/23 0700 11/13/23 0700 11/14/23 0441  Weight: 64 kg 65 kg 65.3 kg    Hemodynamic parameters for last 24 hours:    Intake/Output from previous day: 10/13 0701 - 10/14 0700 In: 48.1 [I.V.:48.1] Out: 1220 [Urine:1220] Intake/Output this shift: No intake/output data recorded.  Physical Exam: General - Resting comfortably in bed CV - RRR Resp - Unlabored on RA Abd - Soft, ND/NT Ext - No leg edema  Lab Results:    Latest Ref Rng & Units 11/14/2023    4:12 AM 11/13/2023    4:43 AM 11/11/2023    2:09 PM  CBC  WBC 4.0 - 10.5 K/uL 5.2  5.8  8.0   Hemoglobin 13.0 - 17.0 g/dL 89.7  89.4  87.3   Hematocrit 39.0 - 52.0 % 31.1  31.5  38.1   Platelets 150 - 400 K/uL 104  90  102       Latest Ref Rng & Units 11/14/2023    4:12 AM 11/13/2023    4:43 AM 11/12/2023    5:22 AM  CMP  Glucose 70 - 99 mg/dL 857  91  880   BUN 8 - 23 mg/dL 40  38  34   Creatinine 0.61 - 1.24 mg/dL 7.90  7.84  7.95   Sodium 135 - 145 mmol/L 139  136  136   Potassium 3.5 - 5.1 mmol/L 4.2  4.1  3.1   Chloride 98 - 111 mmol/L 105  103  102   CO2 22 - 32 mmol/L 22  22  23    Calcium  8.9 - 10.3 mg/dL 7.6  7.5  7.5     CXR: No effusions, no pneumothorax  Assessment/Plan: S/P Procedure(s) (LRB): AORTIC VALVE REPLACEMENT USING A INSPIRIS VALVE. (N/A) CLIPPING OF LEFT ATRIAL APPENDAGE USING A CLIP (N/A) ECHOCARDIOGRAM, TRANSESOPHAGEAL, INTRAOPERATIVE (N/A) POD6 s/p  AVR, post-op course complicated by seizure (MRI negative) and afib, now in NSR after amio NEURO- intact  Pain control PRN Deconditioning- PT/OT -> Undergoing eval for CIR given he lives alone and does not have anyone who can stay with him around the clock CV- in SR around 90 bpm             Continue PO amio today  Continue metoprolol , add norvasc  for better BP control RESP- Continued improved lung aeration             Continue IS, pulm hygiene, ambulation RENAL- creatinine stable (pre-op Cre 2.1-2.3) and lytes Ok  Weight stable             PO lasix  40 daily GI- tolerating regular diet  BM: history of constipation, continue bowel regimen -> daily miralax  Endo- BG well controlled Continue lantus  + ISS ACHS ID- NAI DVT ppx - SCD + HSQ 5000 TID  Dispo: floor, awaiting CIR evaluation   LOS: 12 days    Con RAMAN Eligio Angert 11/14/2023

## 2023-11-14 NOTE — Progress Notes (Signed)
 Physical Therapy Treatment Patient Details Name: Michael Bender MRN: [id number] DOB: 06-20-1945 Today's Date: 11/14/2023   History of Present Illness 78 yo male s/p AVR with clipping of atrial appendage 10/8. New onset seizure with  Left arm and L leg and L face twitching with L gaze post op. 10/10 Afib with RVR. PMH: peripheral vascular disease, hypertension, hyperlipidemia, history of orthostatic hypotension, GERD, multiple falls, traumatic rhabdomyolysis, AKI, acute urinary retention with bladder outlet obstruction, BPH.    PT Comments  Needs frequent cues to maintain sternal precautions throughout session. Encouraged use of heart pillow to reinforce safe movement and recall. BERG 28/56 indicating high fall risk. Good endurance and VSS on RA with ambulation. Requires min assist without AD due to impaired balance and delayed righting reactions. CGA with RW showing some erratic control, again needing cues to maintain precautions today. Appreciative of visit. Encouraged LE exercises between PT visits, and OOB often with staff as tolerated. Patient will continue to benefit from skilled physical therapy services to further improve independence with functional mobility. Patient will benefit from intensive inpatient follow-up therapy, >3 hours/day    If plan is discharge home, recommend the following: Assist for transportation;Assistance with cooking/housework;A little help with walking and/or transfers;A little help with bathing/dressing/bathroom   Can travel by private vehicle        Equipment Recommendations  None recommended by PT    Recommendations for Other Services Rehab consult     Precautions / Restrictions Precautions Precautions: Fall;Sternal Recall of Precautions/Restrictions: Impaired Precaution/Restrictions Comments: Reviewed precautions several times. Restrictions Weight Bearing Restrictions Per Provider Order: Yes RUE Weight Bearing Per Provider Order: Non weight  bearing LUE Weight Bearing Per Provider Order: Non weight bearing Other Position/Activity Restrictions: sternal     Mobility  Bed Mobility Overal bed mobility: Needs Assistance Bed Mobility: Supine to Sit, Sit to Supine     Supine to sit: Contact guard Sit to supine: Contact guard assist   General bed mobility comments: CGA for safety, a bit impulsive to rise without awareness of sternal precautions. Needs cues throughout and review of techniques to rise and lower, encouraged use of heart pillo at all times to remember.    Transfers Overall transfer level: Needs assistance Equipment used: Rolling walker (2 wheels), None Transfers: Sit to/from Stand Sit to Stand: Contact guard assist           General transfer comment: CGA for safety with increased sway, bracing legs agains bed for support. Practiced with and without RW, cues for hand placement on heart pillow to reinforce sternal precautions.    Ambulation/Gait Ambulation/Gait assistance: Min assist Gait Distance (Feet): 310 Feet Assistive device: Rolling walker (2 wheels), None Gait Pattern/deviations: Step-through pattern, Narrow base of support, Decreased stride length, Staggering right Gait velocity: decreased Gait velocity interpretation: <1.8 ft/sec, indicate of risk for recurrent falls   General Gait Details: Min assist without assistive device due to LOB, delayed righting reflexes. Cues for awareness. Provided RW and maintains better stability with CGA. Still shows some erratic control of RW and needs cued not to pick RW up and only apply light pressure through hands. Challenged with head turns and nods.   Stairs             Wheelchair Mobility     Tilt Bed    Modified Rankin (Stroke Patients Only)       Balance Overall balance assessment: Needs assistance Sitting-balance support: No upper extremity supported, Feet supported Sitting balance-Leahy Scale: Good  Standing balance support: No  upper extremity supported Standing balance-Leahy Scale: Fair Standing balance comment: CGA for safety                 Standardized Balance Assessment Standardized Balance Assessment : Berg Balance Test Berg Balance Test Sit to Stand: Able to stand  independently using hands Standing Unsupported: Able to stand 2 minutes with supervision Sitting with Back Unsupported but Feet Supported on Floor or Stool: Able to sit safely and securely 2 minutes Stand to Sit: Sits safely with minimal use of hands Transfers: Able to transfer with verbal cueing and /or supervision Standing Unsupported with Eyes Closed: Needs help to keep from falling Standing Ubsupported with Feet Together: Able to place feet together independently and stand 1 minute safely From Standing, Reach Forward with Outstretched Arm: Can reach confidently >25 cm (10) From Standing Position, Pick up Object from Floor: Unable to pick up and needs supervision From Standing Position, Turn to Look Behind Over each Shoulder: Needs supervision when turning Turn 360 Degrees: Needs assistance while turning Standing Unsupported, Alternately Place Feet on Step/Stool: Needs assistance to keep from falling or unable to try Standing Unsupported, One Foot in Front: Able to take small step independently and hold 30 seconds Standing on One Leg: Unable to try or needs assist to prevent fall Total Score: 28        Communication Communication Communication: Impaired Factors Affecting Communication: Hearing impaired  Cognition Arousal: Alert Behavior During Therapy: WFL for tasks assessed/performed   PT - Cognitive impairments: Problem solving, Safety/Judgement, Memory, Awareness                       PT - Cognition Comments: Needs frequent cues to maintain sternal precautions Following commands: Impaired Following commands impaired: Follows multi-step commands inconsistently    Cueing Cueing Techniques: Verbal cues, Gestural  cues  Exercises General Exercises - Lower Extremity Ankle Circles/Pumps: AROM, Both, 15 reps, Seated Quad Sets: Strengthening, Both, 10 reps, Supine Gluteal Sets: Strengthening, Both, 10 reps, Seated    General Comments General comments (skin integrity, edema, etc.): VSS on RA      Pertinent Vitals/Pain Pain Assessment Pain Assessment: No/denies pain    Home Living                          Prior Function            PT Goals (current goals can now be found in the care plan section) Acute Rehab PT Goals Patient Stated Goal: to go home PT Goal Formulation: With patient Time For Goal Achievement: 11/24/23 Potential to Achieve Goals: Good Progress towards PT goals: Progressing toward goals    Frequency    Min 3X/week      PT Plan      Co-evaluation              AM-PAC PT 6 Clicks Mobility   Outcome Measure  Help needed turning from your back to your side while in a flat bed without using bedrails?: A Little Help needed moving from lying on your back to sitting on the side of a flat bed without using bedrails?: A Little Help needed moving to and from a bed to a chair (including a wheelchair)?: A Little Help needed standing up from a chair using your arms (e.g., wheelchair or bedside chair)?: A Little Help needed to walk in hospital room?: A Little Help needed climbing 3-5 steps with a  railing? : A Lot 6 Click Score: 17    End of Session Equipment Utilized During Treatment: Gait belt Activity Tolerance: Patient tolerated treatment well Patient left: with call bell/phone within reach;in bed;with bed alarm set   PT Visit Diagnosis: Unsteadiness on feet (R26.81);Other abnormalities of gait and mobility (R26.89);Difficulty in walking, not elsewhere classified (R26.2)     Time: 8398-8386 PT Time Calculation (min) (ACUTE ONLY): 12 min  Charges:    $Gait Training: 8-22 mins PT General Charges $$ ACUTE PT VISIT: 1 Visit                      Leontine Roads, PT, DPT [facility]  Rehabilitation Services Physical Therapist Office: [phone] Website: [email or web address]    Leontine GORMAN Roads 11/14/2023, 4:41 PM

## 2023-11-14 NOTE — Progress Notes (Signed)
 Inpatient Rehab Admissions Coordinator:    CIR following. Case opened with insurance this AM, will follow for potential admit pending insurance auth.   Leita Kleine, MS, CCC-SLP Rehab Admissions Coordinator  [phone] (celll) [phone] (office)

## 2023-11-15 ENCOUNTER — Inpatient Hospital Stay (HOSPITAL_COMMUNITY)
Admission: AD | Admit: 2023-11-15 | Discharge: 2023-11-21 | DRG: 945 | Disposition: A | Discharge status: Home / Self Care | Source: Intra-hospital | Attending: Physical Medicine and Rehabilitation | Admitting: Physical Medicine and Rehabilitation

## 2023-11-15 ENCOUNTER — Other Ambulatory Visit: Payer: Self-pay

## 2023-11-15 DIAGNOSIS — I351 Nonrheumatic aortic (valve) insufficiency: Secondary | ICD-10-CM

## 2023-11-15 DIAGNOSIS — R531 Weakness: Secondary | ICD-10-CM | POA: Diagnosis present

## 2023-11-15 DIAGNOSIS — Z7952 Long term (current) use of systemic steroids: Secondary | ICD-10-CM

## 2023-11-15 DIAGNOSIS — Z794 Long term (current) use of insulin: Secondary | ICD-10-CM

## 2023-11-15 DIAGNOSIS — Z87891 Personal history of nicotine dependence: Secondary | ICD-10-CM

## 2023-11-15 DIAGNOSIS — Z953 Presence of xenogenic heart valve: Secondary | ICD-10-CM | POA: Diagnosis not present

## 2023-11-15 DIAGNOSIS — I4891 Unspecified atrial fibrillation: Secondary | ICD-10-CM

## 2023-11-15 DIAGNOSIS — I130 Hypertensive heart and chronic kidney disease with heart failure and stage 1 through stage 4 chronic kidney disease, or unspecified chronic kidney disease: Secondary | ICD-10-CM | POA: Diagnosis present

## 2023-11-15 DIAGNOSIS — E1122 Type 2 diabetes mellitus with diabetic chronic kidney disease: Secondary | ICD-10-CM | POA: Diagnosis not present

## 2023-11-15 DIAGNOSIS — N1832 Chronic kidney disease, stage 3b: Secondary | ICD-10-CM | POA: Diagnosis present

## 2023-11-15 DIAGNOSIS — I739 Peripheral vascular disease, unspecified: Secondary | ICD-10-CM | POA: Diagnosis present

## 2023-11-15 DIAGNOSIS — D509 Iron deficiency anemia, unspecified: Secondary | ICD-10-CM | POA: Diagnosis present

## 2023-11-15 DIAGNOSIS — N179 Acute kidney failure, unspecified: Secondary | ICD-10-CM | POA: Diagnosis not present

## 2023-11-15 DIAGNOSIS — D696 Thrombocytopenia, unspecified: Secondary | ICD-10-CM | POA: Diagnosis present

## 2023-11-15 DIAGNOSIS — D649 Anemia, unspecified: Secondary | ICD-10-CM | POA: Diagnosis present

## 2023-11-15 DIAGNOSIS — K5909 Other constipation: Secondary | ICD-10-CM | POA: Diagnosis not present

## 2023-11-15 DIAGNOSIS — K219 Gastro-esophageal reflux disease without esophagitis: Secondary | ICD-10-CM | POA: Diagnosis present

## 2023-11-15 DIAGNOSIS — E559 Vitamin D deficiency, unspecified: Secondary | ICD-10-CM | POA: Diagnosis not present

## 2023-11-15 DIAGNOSIS — I5033 Acute on chronic diastolic (congestive) heart failure: Secondary | ICD-10-CM

## 2023-11-15 DIAGNOSIS — Z79899 Other long term (current) drug therapy: Secondary | ICD-10-CM

## 2023-11-15 DIAGNOSIS — Z8249 Family history of ischemic heart disease and other diseases of the circulatory system: Secondary | ICD-10-CM

## 2023-11-15 DIAGNOSIS — I480 Paroxysmal atrial fibrillation: Secondary | ICD-10-CM | POA: Diagnosis not present

## 2023-11-15 DIAGNOSIS — G4700 Insomnia, unspecified: Secondary | ICD-10-CM | POA: Diagnosis present

## 2023-11-15 DIAGNOSIS — N400 Enlarged prostate without lower urinary tract symptoms: Secondary | ICD-10-CM | POA: Diagnosis not present

## 2023-11-15 DIAGNOSIS — Z8673 Personal history of transient ischemic attack (TIA), and cerebral infarction without residual deficits: Secondary | ICD-10-CM

## 2023-11-15 DIAGNOSIS — H918X3 Other specified hearing loss, bilateral: Secondary | ICD-10-CM | POA: Diagnosis present

## 2023-11-15 DIAGNOSIS — Z981 Arthrodesis status: Secondary | ICD-10-CM

## 2023-11-15 DIAGNOSIS — D62 Acute posthemorrhagic anemia: Secondary | ICD-10-CM | POA: Diagnosis present

## 2023-11-15 DIAGNOSIS — Z888 Allergy status to other drugs, medicaments and biological substances status: Secondary | ICD-10-CM

## 2023-11-15 DIAGNOSIS — E785 Hyperlipidemia, unspecified: Secondary | ICD-10-CM | POA: Diagnosis present

## 2023-11-15 DIAGNOSIS — G2581 Restless legs syndrome: Secondary | ICD-10-CM | POA: Diagnosis not present

## 2023-11-15 DIAGNOSIS — I5032 Chronic diastolic (congestive) heart failure: Secondary | ICD-10-CM | POA: Diagnosis present

## 2023-11-15 DIAGNOSIS — J449 Chronic obstructive pulmonary disease, unspecified: Secondary | ICD-10-CM | POA: Diagnosis not present

## 2023-11-15 DIAGNOSIS — Z7984 Long term (current) use of oral hypoglycemic drugs: Secondary | ICD-10-CM

## 2023-11-15 DIAGNOSIS — N183 Chronic kidney disease, stage 3 unspecified: Secondary | ICD-10-CM | POA: Diagnosis present

## 2023-11-15 DIAGNOSIS — K5901 Slow transit constipation: Secondary | ICD-10-CM | POA: Diagnosis not present

## 2023-11-15 DIAGNOSIS — K5900 Constipation, unspecified: Secondary | ICD-10-CM

## 2023-11-15 DIAGNOSIS — I10 Essential (primary) hypertension: Secondary | ICD-10-CM | POA: Diagnosis present

## 2023-11-15 DIAGNOSIS — R5381 Other malaise: Secondary | ICD-10-CM | POA: Diagnosis not present

## 2023-11-15 DIAGNOSIS — G479 Sleep disorder, unspecified: Secondary | ICD-10-CM | POA: Diagnosis not present

## 2023-11-15 DIAGNOSIS — Z7985 Long-term (current) use of injectable non-insulin antidiabetic drugs: Secondary | ICD-10-CM

## 2023-11-15 LAB — BASIC METABOLIC PANEL WITH GFR
Anion gap: 10 (ref 5–15)
BUN: 37 mg/dL — ABNORMAL HIGH (ref 8–23)
CO2: 22 mmol/L (ref 22–32)
Calcium: 7.7 mg/dL — ABNORMAL LOW (ref 8.9–10.3)
Chloride: 107 mmol/L (ref 98–111)
Creatinine, Ser: 1.9 mg/dL — ABNORMAL HIGH (ref 0.61–1.24)
GFR, Estimated: 36 mL/min — ABNORMAL LOW (ref >60)
Glucose, Bld: 152 mg/dL — ABNORMAL HIGH (ref 70–99)
Potassium: 3.7 mmol/L (ref 3.5–5.1)
Sodium: 139 mmol/L (ref 135–145)

## 2023-11-15 LAB — GLUCOSE, CAPILLARY
Glucose-Capillary: 133 mg/dL — ABNORMAL HIGH (ref 70–99)
Glucose-Capillary: 223 mg/dL — ABNORMAL HIGH (ref 70–99)
Glucose-Capillary: 180 mg/dL — ABNORMAL HIGH (ref 70–99)
Glucose-Capillary: 260 mg/dL — ABNORMAL HIGH (ref 70–99)

## 2023-11-15 MED ORDER — INSULIN ASPART 100 UNIT/ML IJ SOLN
0.0000 [IU] | Freq: Every day | INTRAMUSCULAR | Status: DC
  Administered 2023-11-15: 3 [IU] via SUBCUTANEOUS
  Administered 2023-11-16 – 2023-11-17 (×2): 2 [IU] via SUBCUTANEOUS
  Administered 2023-11-19: 3 [IU] via SUBCUTANEOUS
  Administered 2023-11-20: 4 [IU] via SUBCUTANEOUS

## 2023-11-15 MED ORDER — TRAZODONE HCL 50 MG PO TABS
25.0000 mg | ORAL_TABLET | Freq: Every evening | ORAL | Status: DC | PRN
  Administered 2023-11-15: 50 mg via ORAL
  Filled 2023-11-15: qty 1

## 2023-11-15 MED ORDER — MELATONIN 3 MG PO TABS
3.0000 mg | ORAL_TABLET | Freq: Every day | ORAL | Status: DC
  Administered 2023-11-15 – 2023-11-16 (×2): 3 mg via ORAL
  Filled 2023-11-15 (×2): qty 1

## 2023-11-15 MED ORDER — ENOXAPARIN SODIUM 40 MG/0.4ML IJ SOSY: 40.0000 mg | PREFILLED_SYRINGE | INTRAMUSCULAR | Status: DC

## 2023-11-15 MED ORDER — AMLODIPINE BESYLATE 10 MG PO TABS
10.0000 mg | ORAL_TABLET | Freq: Every day | ORAL | Status: DC
  Administered 2023-11-16 – 2023-11-21 (×6): 10 mg via ORAL
  Filled 2023-11-15 (×6): qty 1

## 2023-11-15 MED ORDER — ASPIRIN 81 MG PO TBEC
81.0000 mg | DELAYED_RELEASE_TABLET | Freq: Every day | ORAL | Status: DC
  Administered 2023-11-16 – 2023-11-21 (×6): 81 mg via ORAL
  Filled 2023-11-15 (×6): qty 1

## 2023-11-15 MED ORDER — PROCHLORPERAZINE 25 MG RE SUPP: 12.5000 mg | Freq: Four times a day (QID) | RECTAL | Status: DC | PRN

## 2023-11-15 MED ORDER — ADULT MULTIVITAMIN W/MINERALS CH: 1.0000 | ORAL_TABLET | Freq: Every day | ORAL | Status: DC

## 2023-11-15 MED ORDER — ROSUVASTATIN CALCIUM 20 MG PO TABS
40.0000 mg | ORAL_TABLET | Freq: Every day | ORAL | Status: DC
  Administered 2023-11-16 – 2023-11-21 (×6): 40 mg via ORAL
  Filled 2023-11-15 (×6): qty 2

## 2023-11-15 MED ORDER — DOCUSATE SODIUM 100 MG PO CAPS
200.0000 mg | ORAL_CAPSULE | Freq: Every day | ORAL | Status: DC
  Administered 2023-11-16 – 2023-11-18 (×3): 200 mg via ORAL
  Filled 2023-11-15 (×3): qty 2

## 2023-11-15 MED ORDER — BISACODYL 10 MG RE SUPP: 10.0000 mg | Freq: Every day | RECTAL | Status: DC | PRN

## 2023-11-15 MED ORDER — BLOOD GLUCOSE TEST VI STRP: 1.0000 | ORAL_STRIP | Freq: Three times a day (TID) | Status: AC

## 2023-11-15 MED ORDER — AMLODIPINE BESYLATE 10 MG PO TABS: 10.0000 mg | ORAL_TABLET | Freq: Every day | ORAL | Status: DC

## 2023-11-15 MED ORDER — ASPIRIN 81 MG PO TBEC: 81.0000 mg | DELAYED_RELEASE_TABLET | Freq: Every day | ORAL | Status: DC

## 2023-11-15 MED ORDER — ROPINIROLE HCL 0.25 MG PO TABS: 0.2500 mg | ORAL_TABLET | Freq: Three times a day (TID) | ORAL | Status: DC | PRN

## 2023-11-15 MED ORDER — EMPAGLIFLOZIN 10 MG PO TABS
10.0000 mg | ORAL_TABLET | Freq: Every day | ORAL | Status: DC
  Administered 2023-11-16 – 2023-11-21 (×6): 10 mg via ORAL
  Filled 2023-11-15 (×6): qty 1

## 2023-11-15 MED ORDER — ALUM & MAG HYDROXIDE-SIMETH 200-200-20 MG/5ML PO SUSP: 30.0000 mL | ORAL | Status: DC | PRN

## 2023-11-15 MED ORDER — AMLODIPINE BESYLATE 10 MG PO TABS
10.0000 mg | ORAL_TABLET | Freq: Every day | ORAL | Status: DC
  Administered 2023-11-15: 10 mg via ORAL
  Filled 2023-11-15: qty 1

## 2023-11-15 MED ORDER — LANCETS MISC. MISC
1.0000 | Freq: Three times a day (TID) | Status: DC
Start: 2023-11-15 — End: 2023-11-21

## 2023-11-15 MED ORDER — FUROSEMIDE 40 MG PO TABS
40.0000 mg | ORAL_TABLET | Freq: Every day | ORAL | Status: DC
  Administered 2023-11-16 – 2023-11-21 (×6): 40 mg via ORAL
  Filled 2023-11-15 (×6): qty 1

## 2023-11-15 MED ORDER — OXYCODONE HCL 5 MG PO TABS: 5.0000 mg | ORAL_TABLET | ORAL | Status: DC | PRN

## 2023-11-15 MED ORDER — LEVETIRACETAM 500 MG PO TABS: 500.0000 mg | ORAL_TABLET | Freq: Two times a day (BID) | ORAL | Status: AC

## 2023-11-15 MED ORDER — BISACODYL 5 MG PO TBEC
10.0000 mg | DELAYED_RELEASE_TABLET | Freq: Every day | ORAL | Status: DC
  Administered 2023-11-16 – 2023-11-18 (×3): 10 mg via ORAL
  Filled 2023-11-15 (×3): qty 2

## 2023-11-15 MED ORDER — ACETAMINOPHEN 325 MG PO TABS: 325.0000 mg | ORAL_TABLET | ORAL | Status: DC | PRN

## 2023-11-15 MED ORDER — HEPARIN SODIUM (PORCINE) 5000 UNIT/ML IJ SOLN
5000.0000 [IU] | Freq: Three times a day (TID) | INTRAMUSCULAR | Status: DC
  Administered 2023-11-15 – 2023-11-21 (×17): 5000 [IU] via SUBCUTANEOUS
  Filled 2023-11-15 (×17): qty 1

## 2023-11-15 MED ORDER — FLEET ENEMA RE ENEM: 1.0000 | ENEMA | Freq: Once | RECTAL | Status: DC | PRN

## 2023-11-15 MED ORDER — FUROSEMIDE 40 MG PO TABS: 40.0000 mg | ORAL_TABLET | Freq: Every day | ORAL | Status: DC

## 2023-11-15 MED ORDER — IPRATROPIUM-ALBUTEROL 0.5-2.5 (3) MG/3ML IN SOLN: 3.0000 mL | RESPIRATORY_TRACT | Status: DC | PRN

## 2023-11-15 MED ORDER — SUMATRIPTAN SUCCINATE 50 MG PO TABS: 50.0000 mg | ORAL_TABLET | Freq: Every day | ORAL | Status: DC | PRN

## 2023-11-15 MED ORDER — PROCHLORPERAZINE EDISYLATE 10 MG/2ML IJ SOLN: 10.0000 mg | Freq: Four times a day (QID) | INTRAMUSCULAR | Status: DC | PRN

## 2023-11-15 MED ORDER — PROCHLORPERAZINE EDISYLATE 10 MG/2ML IJ SOLN: 5.0000 mg | Freq: Four times a day (QID) | INTRAMUSCULAR | Status: DC | PRN

## 2023-11-15 MED ORDER — PROCHLORPERAZINE MALEATE 5 MG PO TABS: 5.0000 mg | ORAL_TABLET | Freq: Four times a day (QID) | ORAL | Status: DC | PRN

## 2023-11-15 MED ORDER — AMIODARONE HCL 200 MG PO TABS
400.0000 mg | ORAL_TABLET | Freq: Two times a day (BID) | ORAL | Status: AC
  Administered 2023-11-15 – 2023-11-17 (×5): 400 mg via ORAL
  Filled 2023-11-15 (×5): qty 2

## 2023-11-15 MED ORDER — INSULIN ASPART 100 UNIT/ML IJ SOLN
0.0000 [IU] | Freq: Three times a day (TID) | INTRAMUSCULAR | Status: DC
  Administered 2023-11-15: 2 [IU] via SUBCUTANEOUS
  Administered 2023-11-16: 3 [IU] via SUBCUTANEOUS
  Administered 2023-11-16 (×2): 2 [IU] via SUBCUTANEOUS
  Administered 2023-11-17: 5 [IU] via SUBCUTANEOUS
  Administered 2023-11-17: 3 [IU] via SUBCUTANEOUS
  Administered 2023-11-17: 1 [IU] via SUBCUTANEOUS
  Administered 2023-11-18: 12 [IU] via SUBCUTANEOUS
  Administered 2023-11-18: 2 [IU] via SUBCUTANEOUS
  Administered 2023-11-18: 3 [IU] via SUBCUTANEOUS
  Administered 2023-11-19: 2 [IU] via SUBCUTANEOUS
  Administered 2023-11-19: 5 [IU] via SUBCUTANEOUS
  Administered 2023-11-19: 3 [IU] via SUBCUTANEOUS
  Administered 2023-11-20 (×2): 2 [IU] via SUBCUTANEOUS
  Administered 2023-11-20 – 2023-11-21 (×2): 3 [IU] via SUBCUTANEOUS

## 2023-11-15 MED ORDER — POLYVINYL ALCOHOL 1.4 % OP SOLN: 1.0000 [drp] | OPHTHALMIC | Status: DC | PRN

## 2023-11-15 MED ORDER — DIPHENHYDRAMINE HCL 25 MG PO CAPS: 25.0000 mg | ORAL_CAPSULE | Freq: Four times a day (QID) | ORAL | Status: DC | PRN

## 2023-11-15 MED ORDER — METOPROLOL TARTRATE 5 MG/5ML IV SOLN: 2.5000 mg | INTRAVENOUS | Status: DC | PRN

## 2023-11-15 MED ORDER — PANTOPRAZOLE SODIUM 40 MG PO TBEC
40.0000 mg | DELAYED_RELEASE_TABLET | Freq: Every day | ORAL | Status: DC
  Administered 2023-11-16 – 2023-11-21 (×6): 40 mg via ORAL
  Filled 2023-11-15 (×6): qty 1

## 2023-11-15 MED ORDER — ORAL CARE MOUTH RINSE: 15.0000 mL | OROMUCOSAL | Status: DC | PRN

## 2023-11-15 MED ORDER — METOPROLOL TARTRATE 50 MG PO TABS: 50.0000 mg | ORAL_TABLET | Freq: Two times a day (BID) | ORAL | Status: AC

## 2023-11-15 MED ORDER — LANCET DEVICE MISC: 1.0000 | Freq: Three times a day (TID) | Status: AC

## 2023-11-15 MED ORDER — METOPROLOL TARTRATE 50 MG PO TABS
50.0000 mg | ORAL_TABLET | Freq: Two times a day (BID) | ORAL | Status: DC
  Administered 2023-11-15: 50 mg via ORAL
  Filled 2023-11-15: qty 1

## 2023-11-15 MED ORDER — POLYETHYLENE GLYCOL 3350 17 G PO PACK
17.0000 g | PACK | Freq: Every day | ORAL | Status: DC
  Administered 2023-11-16 – 2023-11-18 (×3): 17 g via ORAL
  Filled 2023-11-15 (×3): qty 1

## 2023-11-15 MED ORDER — POTASSIUM CHLORIDE CRYS ER 20 MEQ PO TBCR
20.0000 meq | EXTENDED_RELEASE_TABLET | Freq: Every day | ORAL | Status: AC
  Administered 2023-11-15: 20 meq via ORAL
  Filled 2023-11-15: qty 1

## 2023-11-15 MED ORDER — ENSURE PLUS HIGH PROTEIN PO LIQD
237.0000 mL | Freq: Two times a day (BID) | ORAL | Status: DC
  Administered 2023-11-16 – 2023-11-21 (×10): 237 mL via ORAL

## 2023-11-15 MED ORDER — GUAIFENESIN-DM 100-10 MG/5ML PO SYRP: 5.0000 mL | ORAL_SOLUTION | Freq: Four times a day (QID) | ORAL | Status: DC | PRN

## 2023-11-15 MED ORDER — LEVETIRACETAM 250 MG PO TABS
500.0000 mg | ORAL_TABLET | Freq: Two times a day (BID) | ORAL | Status: DC
  Administered 2023-11-15 – 2023-11-21 (×12): 500 mg via ORAL
  Filled 2023-11-15 (×12): qty 2

## 2023-11-15 MED ORDER — CHLORHEXIDINE GLUCONATE CLOTH 2 % EX PADS
6.0000 | MEDICATED_PAD | Freq: Every day | CUTANEOUS | Status: DC
  Administered 2023-11-17 – 2023-11-21 (×5): 6 via TOPICAL

## 2023-11-15 MED ORDER — METOPROLOL TARTRATE 50 MG PO TABS
50.0000 mg | ORAL_TABLET | Freq: Two times a day (BID) | ORAL | Status: DC
  Administered 2023-11-15 – 2023-11-21 (×12): 50 mg via ORAL
  Filled 2023-11-15 (×12): qty 1

## 2023-11-15 MED ORDER — INSULIN GLARGINE-YFGN 100 UNIT/ML ~~LOC~~ SOLN
5.0000 [IU] | Freq: Every day | SUBCUTANEOUS | Status: DC
  Filled 2023-11-15: qty 0.05

## 2023-11-15 MED ORDER — AMIODARONE HCL 200 MG PO TABS
200.0000 mg | ORAL_TABLET | Freq: Two times a day (BID) | ORAL | Status: DC
  Administered 2023-11-18 – 2023-11-21 (×7): 200 mg via ORAL
  Filled 2023-11-15 (×7): qty 1

## 2023-11-15 MED ORDER — ADULT MULTIVITAMIN W/MINERALS CH: 1.0000 | ORAL_TABLET | Freq: Every day | ORAL | Status: AC

## 2023-11-15 MED ORDER — AMIODARONE HCL 200 MG PO TABS
ORAL_TABLET | ORAL | Status: DC
Start: 2023-11-15 — End: 2023-11-21

## 2023-11-15 NOTE — Progress Notes (Signed)
 Mobility Specialist Progress Note;   11/15/23 1040  Mobility  Activity Ambulated with assistance  Level of Assistance Contact guard assist, steadying assist  Assistive Device Front wheel walker  Distance Ambulated (ft) 8 ft  RUE Weight Bearing Per Provider Order NWB  LUE Weight Bearing Per Provider Order NWB  Activity Response Tolerated fair  Mobility Referral Yes  Mobility visit 1 Mobility  Mobility Specialist Start Time (ACUTE ONLY) 1040  Mobility Specialist Stop Time (ACUTE ONLY) 1045  Mobility Specialist Time Calculation (min) (ACUTE ONLY) 5 min   Pt agreeable to mobility. Upon standing, HR increased up to 149bpm. Pt insisted on still ambulating, encouraged pt to sit back down and rest d/t increased HR w/ exertion. Pt left with all needs met. Visitor in room.   Lauraine Erm Mobility Specialist Please contact via SecureChat or [employer] [phone]

## 2023-11-15 NOTE — Progress Notes (Signed)
Sutures removed per order  

## 2023-11-15 NOTE — TOC Transition Note (Signed)
 Transition of Care (TOC) - Discharge Note Rayfield Gobble RN, BSN Inpatient Care Management Unit 4E- RN Case Manager See Treatment Team for direct phone #   Patient Details  Name: Michael Bender MRN: [id number] Date of Birth: [birth year]  [facility]) CM/SW Contact:  Gobble Rayfield Hurst, RN Phone Number: 11/15/2023, 2:19 PM   Clinical Narrative:    Pt stable to transition to [facility] INPT rehab, will transition later today once bed assigned.   No further IP CM needs noted.    Final next level of care: IP Rehab Facility Barriers to Discharge: Barriers Resolved   Patient Goals and CMS Choice Patient states their goals for this hospitalization and ongoing recovery are:: rehab to get stronger then return home   Choice offered to / list presented to : Patient      Discharge Placement               [facility] INPT rehab        Discharge Plan and Services Additional resources added to the After Visit Summary for   In-house Referral: NA Discharge Planning Services: CM Consult Post Acute Care Choice: IP Rehab          DME Arranged: N/A DME Agency: NA       HH Arranged: NA HH Agency: NA        Social Drivers of Health (SDOH) Interventions SDOH Screenings   Food Insecurity: No Food Insecurity (11/01/2023)  Housing: Low Risk  (11/01/2023)  Transportation Needs: No Transportation Needs (11/01/2023)  Utilities: Not At Risk (11/01/2023)  Alcohol Screen: Low Risk  (04/24/2023)  Depression (PHQ2-9): Low Risk  (04/19/2023)  Financial Resource Strain: Low Risk  (04/24/2023)  Physical Activity: Insufficiently Active (10/13/2022)  Social Connections: Socially Isolated (11/01/2023)  Stress: No Stress Concern Present (10/13/2022)  Tobacco Use: Medium Risk (11/08/2023)  Health Literacy: Inadequate Health Literacy (10/13/2022)     Readmission Risk Interventions    11/15/2023    2:18 PM 11/03/2023    4:23 PM 04/21/2023    4:14 PM  Readmission Risk Prevention Plan   Transportation Screening Complete Complete Complete  PCP or Specialist Appt within 3-5 Days   Complete  HRI or Home Care Consult   Complete  Social Work Consult for Recovery Care Planning/Counseling   Complete  Palliative Care Screening   Not Applicable  Medication Review Oceanographer)  Complete Complete  PCP or Specialist appointment within 3-5 days of discharge  Complete   HRI or Home Care Consult  Complete   SW Recovery Care/Counseling Consult  Complete   Palliative Care Screening  Not Applicable   Skilled Nursing Facility  Not Applicable

## 2023-11-15 NOTE — Progress Notes (Signed)
 PMR Admission Coordinator Pre-Admission Assessment   Patient: Michael Bender is an 78 y.o., male MRN: [id number] DOB: April 26, 1945 Height: 5' 11 (180.3 cm) Weight: 65.3 kg   Insurance Information HMO: yes    PPO: yes     PCP:      IPA:      80/20:      OTHER:  PRIMARY: [employer] Medicare HMO/PPO      Policy#: [id number]       Subscriber: Pt CM Name: Michael Bender      Phone#: [phone]    Fax#: [phone] Pre-Cert#: [id number]     I received auth for CIR from Tiffany  with [facility]  for admit 10/15 through 10/21/.  Updates due to Tiffany at fax listed above.  Employer:  Benefits:  Phone #:      Name: sophia Gemma Date: 02/01/2023 - still active Deductible: does not have OOP Max: $4,150 ($2,340.21 met)  CIR: $300/day co-pay with a max co-pay of $1,800/admission (6 days) SNF: $10/day co-pay for days 1-20, $214/day co-pay for days 21-100; limited to 100 days/cal yr. Outpatient:  $10 copay/visit Home Health:  100% coverage DME: 80% coverage; 20% co-insurance Providers: in network   SECONDARY:       Policy#:      Phone#:    Artist:       Phone#:    The Data processing manager" for patients in Inpatient Rehabilitation Facilities with attached "Privacy Act Statement-Health Care Records" was provided and verbally reviewed with: Patient   Emergency Contact Information Contact Information       Name Relation Home Work Mobile    [place] Son     [phone]    Julus, Kelley [phone]   [phone]         Other Contacts   None on File        Current Medical History  Patient Admitting Diagnosis: Severe Aortic Regurgitation s/p AVR History of Present Illness: Michael Bender is a 78 y.o. male with past medical history  of  hypertension, hyperlipidemia, GI bleeding due to duodenal ulcer, peripheral vascular disease, smoking and COPD, combined systolic and diastolic heart failure, degenerative spine disease status post multiple surgeries, multiple  falls with traumatic rhabdomyolysis and acute kidney failure,mild ascending aortic dilation  who presented to [facility] 11/01/23 with severe aortic regurgitation. Pt was seen by CT Sx on  08/30/2023 with plan for  open surgical aortic valve replacement as pt is not a candidate for TAVR.  In the ED Initial CMP showed 132 sodium bicarb of 18 anion gap 16 glucose 126 BUN 48 creatinine of 3.0. Normal LFTs.BNP of 634, troponin of 67 and repeated at 62.Lactic acid of 1.1. CBC shows white count of 8.4 hemoglobin of 13.1 platelets 146.  Neck Viral panel negative for flu RSV and COVID. Abnormal chest x-ray showing possible small nodular opacity over the right mid to lower lung with recommended chest x-ray in 3 months along with noncontrast chest CT.Cardiac MRI suggestive of severe AI. TEE suggestive of moderate-severe AI. Pt. Underwent AVR with clipping of atrial appendage 11/08/23. Post operatively, Pt had a seizure with Left arm and L leg and L face twitching with L gaze preference. On 11/10/23 Pt. Developed Afib with RVR. He was seen by PT/OT and they recommend CIR to assist return to PLOF.    Complete NIHSS TOTAL: 11   Patient's medical record from [facility] has been reviewed by the rehabilitation admission coordinator and physician.  Past Medical History      Past Medical History:  Diagnosis Date   Acute combined systolic and diastolic heart failure (HCC) 04/22/2023   Acute on chronic diastolic CHF (congestive heart failure) (HCC) 04/20/2023   AKI (acute kidney injury) 10/28/2022   Arthritis      Back   Balance problems 02/21/2023   Blood transfusion      as a child   Cachexia 05/03/2022    Added based on temporal wasting noted on exam, Body mass index is 19.92 kg/m. At 05/03/2022 encounter     Cervical discitis 11/10/2022   Chronic back pain     COPD (chronic obstructive pulmonary disease) (HCC)     Disturbance of skin sensation 05/22/2018   Effusion of  right knee 02/21/2023   Effusion of right knee joint 01/16/2023   Elevated troponin 04/22/2023   Essential hypertension 03/05/2007    Qualifier: Diagnosis of   By: Krystal MD, Reyes LABOR      Finger pain, left 11/08/2022   Gastric erosion     Gastritis and gastroduodenitis     Gastrointestinal hemorrhage with melena 11/20/2018   GIB (gastrointestinal bleeding) 07/18/2019   Hardware complicating wound infection 11/10/2022   Headache(784.0)      last one 6 months ago   Hemorrhoids, internal     High risk medication use 03/03/2022   History of duodenal ulcer     History of fusion of cervical spine 03/03/2022    With persistent cervical pain and palpable screws  Led to disability  History of attempt to dig furrow in skull to fix it  Last surgery 1992     History of lumbar fusion 03/03/2022    RE-OPERATIVE DIAGNOSIS:  lumbar stenosis synovial cyst lumbar spondylosis spondylolisthesis lumbar radiculoapthy L4/5   PROCEDURE:  Procedure(s): POSTERIOR LUMBAR FUSION 1 LEVEL with resection of synovial cyst     History of upper gastrointestinal bleeding 03/03/2022    Duodenal ulcer 2021 Dr. Avram   HLD (hyperlipidemia)     Hypertension     Hypokalemia 04/22/2023   Hypomagnesemia 04/22/2023   Infected blister of left index finger 11/07/2022   Intractable pain 03/03/2022   Loss of weight      Wt Readings from Last 10 Encounters:  04/05/22  146 lb (66.2 kg)  03/03/22  143 lb 9.6 oz (65.1 kg)  07/14/21  153 lb (69.4 kg)  12/14/20  147 lb 12.8 oz (67 kg)  11/13/19  154 lb (69.9 kg)  09/16/19  146 lb (66.2 kg)  08/06/19  146 lb (66.2 kg)  07/19/19  144 lb 13.5 oz (65.7 kg)  07/17/19  148 lb (67.1 kg)  07/09/19  147 lb 9.6 oz (67 kg)         MSSA bacteremia 11/07/2022   Neck rigidity      post cervical fusion   Nocturia     PAIN, CHRONIC NEC 10/06/2006    Qualifier: Diagnosis of   By: Krystal RN, Leeroy       Pneumonia     Prostate disease     Right sided temporal headache 05/22/2018   S/P  cervical spinal fusion 03/03/2022    With persistent cervical pain and palpable screws Led to disability History of attempt to dig furrow in skull to fix it Last surgery 1992   Senile ecchymosis 01/21/2019   Septic arthritis of wrist, left (HCC) 11/08/2022   Septic infrapatellar bursitis of right knee 11/07/2022    Staphylococcal Arthritis of Right Knee (Updated 01/30/2023)  MRI 01/17/2023 shows worsening findings:   Worsening tear of posterior horn medial meniscus with large radial component Worsening subcortical stress fracture/osteochondral lesion of medial femoral condyle New subcortical stress fracture/osteochondral lesion of medial tibial plateau Moderate-to-large effusion with worsened synovitis     Staphylococcal arthritis of left wrist (HCC) 11/07/2022   Staphylococcal arthritis of right knee (HCC) 11/08/2022   Syncope 11/05/2022   Underweight on examination 05/03/2022          Has the patient had major surgery during 100 days prior to admission? Yes   Family History   family history includes Heart attack (age of onset: 53) in his mother; Heart disease in his mother; Pancreatic cancer in his brother; Pancreatic cancer (age of onset: 22) in his father.   Current Medications  Current Medications    Current Facility-Administered Medications:    0.9 %  sodium chloride  infusion, 250 mL, Intravenous, PRN, Su, Con RAMAN, MD   amiodarone (PACERONE) tablet 400 mg, 400 mg, Oral, BID, 400 mg at 11/14/23 0901 **FOLLOWED BY** [START ON 11/18/2023] amiodarone (PACERONE) tablet 200 mg, 200 mg, Oral, BID, Su, Con RAMAN, MD   amLODipine  (NORVASC ) tablet 5 mg, 5 mg, Oral, Daily, Su, Bailey S, MD, 5 mg at 11/14/23 9344   artificial tears ophthalmic solution 1 drop, 1 drop, Both Eyes, PRN, Su, Con RAMAN, MD, 1 drop at 11/05/23 2123   aspirin EC tablet 81 mg, 81 mg, Oral, Daily, 81 mg at 11/14/23 0901 **OR** [DISCONTINUED] aspirin chewable tablet 81 mg, 81 mg, Per Tube, Daily, Su, Con RAMAN,  MD   bisacodyl  (DULCOLAX) EC tablet 10 mg, 10 mg, Oral, Daily, 10 mg at 11/14/23 0900 **OR** [DISCONTINUED] bisacodyl  (DULCOLAX) suppository 10 mg, 10 mg, Rectal, Daily, Zimmerman, [name], PA-C   Chlorhexidine  Gluconate Cloth 2 % PADS 6 each, 6 each, Topical, Daily, Su, Con RAMAN, MD, 6 each at 11/13/23 1030   [facility], Patient & Family Education, , Does not apply, Once, Su, Bailey S, MD   dextrose  50 % solution 0-50 mL, 0-50 mL, Intravenous, PRN, Su, Con RAMAN, MD   docusate sodium  (COLACE) capsule 200 mg, 200 mg, Oral, Daily, Su, Bailey S, MD, 200 mg at 11/14/23 0901   empagliflozin  (JARDIANCE ) tablet 10 mg, 10 mg, Oral, Daily, Su, Bailey S, MD, 10 mg at 11/14/23 0900   feeding supplement (ENSURE PLUS HIGH PROTEIN) liquid 237 mL, 237 mL, Oral, BID BM, Su, Bailey S, MD, 237 mL at 11/13/23 1428   furosemide  (LASIX ) tablet 40 mg, 40 mg, Oral, Daily, Su, Bailey S, MD, 40 mg at 11/14/23 0900   heparin  injection 5,000 Units, 5,000 Units, Subcutaneous, Q8H, [name], MD, 5,000 Units at 11/14/23 9348   insulin  aspart (novoLOG ) injection 0-15 Units, 0-15 Units, Subcutaneous, TID WC, Su, Bailey S, MD, 2 Units at 11/14/23 9185   insulin  aspart (novoLOG ) injection 0-5 Units, 0-5 Units, Subcutaneous, QHS, Su, Con RAMAN, MD   insulin  glargine (LANTUS ) injection 5 Units, 5 Units, Subcutaneous, Daily, Su, Con RAMAN, MD, 5 Units at 11/14/23 9061   ipratropium-albuterol  (DUONEB) 0.5-2.5 (3) MG/3ML nebulizer solution 3 mL, 3 mL, Nebulization, Q4H PRN, Su, Con RAMAN, MD   levETIRAcetam (KEPPRA) tablet 500 mg, 500 mg, Oral, BID, Paytes, Emma U, RPH, 500 mg at 11/14/23 0900   melatonin tablet 3 mg, 3 mg, Oral, QHS, [name], MD, 3 mg at 11/13/23 2233   metoprolol  tartrate (LOPRESSOR ) injection 2.5-5 mg, 2.5-5 mg, Intravenous, Q2H PRN, Su, Con RAMAN, MD, 5  mg at 11/09/23 1946   metoprolol  tartrate (LOPRESSOR ) tablet 25 mg, 25 mg, Oral, BID, 25 mg at 11/14/23 0900 **OR** [DISCONTINUED] metoprolol   tartrate (LOPRESSOR ) 25 mg/10 mL oral suspension 25 mg, 25 mg, Per Tube, BID, Su, Con RAMAN, MD   multivitamin with minerals tablet 1 tablet, 1 tablet, Oral, Daily, Su, Con RAMAN, MD, 1 tablet at 11/14/23 0900   ondansetron  (ZOFRAN ) injection 4 mg, 4 mg, Intravenous, Q6H PRN, [name], MD, 4 mg at 11/11/23 0818   Oral care mouth rinse, 15 mL, Mouth Rinse, PRN, Su, Con RAMAN, MD   oxyCODONE  (Oxy IR/ROXICODONE ) immediate release tablet 5-10 mg, 5-10 mg, Oral, Q3H PRN, [name], MD, 5 mg at 11/11/23 1549   pantoprazole  (PROTONIX ) EC tablet 40 mg, 40 mg, Oral, Daily, Su, Bailey S, MD, 40 mg at 11/14/23 0900   polyethylene glycol (MIRALAX  / GLYCOLAX ) packet 17 g, 17 g, Oral, Daily, Su, Con RAMAN, MD, 17 g at 11/14/23 9060   prochlorperazine  (COMPAZINE ) injection 10 mg, 10 mg, Intravenous, Q6H PRN, Su, Con RAMAN, MD   rOPINIRole  (REQUIP ) tablet 0.25 mg, 0.25 mg, Oral, TID PRN, Su, Con RAMAN, MD   rosuvastatin  (CRESTOR ) tablet 40 mg, 40 mg, Oral, Daily, Su, Bailey S, MD, 40 mg at 11/14/23 0901   sodium chloride  flush (NS) 0.9 % injection 3 mL, 3 mL, Intravenous, Q12H, Su, Con RAMAN, MD   sodium chloride  flush (NS) 0.9 % injection 3 mL, 3 mL, Intravenous, PRN, Su, Con RAMAN, MD   SUMAtriptan  (IMITREX ) tablet 50 mg, 50 mg, Oral, Daily PRN, [name], MD, 50 mg at 11/11/23 1332     Patients Current Diet:  Diet Order                  Diet Carb Modified Fluid consistency: Thin; Room service appropriate? Yes  Diet effective now                         Precautions / Restrictions Precautions Precautions: Fall, Sternal Restrictions Weight Bearing Restrictions Per Provider Order: Yes RUE Weight Bearing Per Provider Order: Non weight bearing LUE Weight Bearing Per Provider Order: Non weight bearing Other Position/Activity Restrictions: sternal    Has the patient had 2 or more falls or a fall with injury in the past year? No   Prior Activity Level Community (5-7x/wk): Pt. active in the  community PTA   Prior Functional Level Self Care: Did the patient need help bathing, dressing, using the toilet or eating? Independent   Indoor Mobility: Did the patient need assistance with walking from room to room (with or without device)? Independent   Stairs: Did the patient need assistance with internal or external stairs (with or without device)? Independent   Functional Cognition: Did the patient need help planning regular tasks such as shopping or remembering to take medications? Independent   Patient Information Are you of Hispanic, Latino/a,or Spanish origin?: A. No, not of Hispanic, Latino/a, or Spanish origin What is your race?: A. White Do you need or want an interpreter to communicate with a doctor or health care staff?: 0. No   Patient's Response To:  Health Literacy and Transportation Is the patient able to respond to health literacy and transportation needs?: Yes Health Literacy - How often do you need to have someone help you when you read instructions, pamphlets, or other written material from your doctor or pharmacy?: Never In the past 12 months, has lack of transportation kept  you from medical appointments or from getting medications?: No In the past 12 months, has lack of transportation kept you from meetings, work, or from getting things needed for daily living?: No   Home Assistive Devices / Equipment Home Equipment: Grab bars - tub/shower, Grab bars - toilet, Hand held shower head, Other (comment), Rolling Walker (2 wheels), Cane - single point, BSC/3in1, Shower seat (walking stick)   Prior Device Use: Indicate devices/aids used by the patient prior to current illness, exacerbation or injury? None of the above   Current Functional Level Cognition   Orientation Level: Oriented X4    Extremity Assessment (includes Sensation/Coordination)   Upper Extremity Assessment: Generalized weakness  Lower Extremity Assessment: Defer to PT evaluation     ADLs    Overall ADL's : Needs assistance/impaired Eating/Feeding: Set up, Sitting Grooming: Set up, Standing, Oral care Upper Body Bathing: Set up, Supervision/ safety, Sitting Lower Body Bathing: Moderate assistance, Sit to/from stand Upper Body Dressing : Sitting, Moderate assistance Lower Body Dressing: Minimal assistance, Sit to/from stand Toilet Transfer: Contact guard assist, Ambulation, Rolling walker (2 wheels), Regular Toilet Toileting- Clothing Manipulation and Hygiene: Minimal assistance Functional mobility during ADLs: Contact guard assist, Rolling walker (2 wheels)     Mobility   Overal bed mobility: Needs Assistance Bed Mobility: Supine to Sit Supine to sit: Contact guard General bed mobility comments: use of heart pillow     Transfers   Overall transfer level: Needs assistance Equipment used: Rolling walker (2 wheels) Transfers: Sit to/from Stand Sit to Stand: Contact guard assist General transfer comment: Min assist for balance to rise and steady, slowly transitioned to RW with light support. Shows some instability with lateral lean initially but improved. Holds heart pillow to transition. Needs cues to release RW prior to sitting.     Ambulation / Gait / Stairs / Wheelchair Mobility   Ambulation/Gait Ambulation/Gait assistance: Editor, commissioning (Feet): 325 Feet Assistive device: Rolling walker (2 wheels) Gait Pattern/deviations: Step-through pattern, Narrow base of support, Decreased stride length, Staggering right General Gait Details: Educated on safe AD use with transition to RW today. Intermittent cues for upright posture with forward gaze but maintains precautions well throughout duration. Pt with a couple of episodes of LOB requiring light min assist to correct, notably with sharp turns and initial steps. Cues for safety and awareness. VSS throughout on RA. SpO2 95%. Gait velocity: decreased Gait velocity interpretation: <1.8 ft/sec, indicate of risk for  recurrent falls     Posture / Balance Balance Overall balance assessment: Needs assistance Sitting-balance support: No upper extremity supported, Feet supported Sitting balance-Leahy Scale: Good Standing balance support: No upper extremity supported Standing balance-Leahy Scale: Fair Standing balance comment: static stands at sink with minimal unilateral UE support     Special considerations/life events  Special service needs none    Previous Home Environment (from acute therapy documentation) Living Arrangements: Alone Available Help at Discharge: Family, Available PRN/intermittently (sons) Type of Home: House Home Layout: One level Home Access: Level entry Bathroom Shower/Tub: Engineer, manufacturing systems: Standard Bathroom Accessibility: Yes How Accessible: Accessible via walker Home Care Services: No   Discharge Living Setting Plans for Discharge Living Setting: Patient's home Type of Home at Discharge: House Discharge Home Layout: One level Discharge Home Access: Level entry Discharge Bathroom Shower/Tub: Tub/shower unit Discharge Bathroom Toilet: Standard Discharge Bathroom Accessibility: Yes How Accessible: Accessible via walker Does the patient have any problems obtaining your medications?: No   Social/Family/Support Systems Patient Roles: Other (Comment) Contact  Information: [phone] Anticipated Caregiver: Sons can check on him daily, needs mod I goals Caregiver Availability: 24/7 Discharge Plan Discussed with Primary Caregiver: Yes Is Caregiver In Agreement with Plan?: Yes Does Caregiver/Family have Issues with Lodging/Transportation while Pt is in Rehab?: No   Goals Patient/Family Goal for Rehab: PT/OT Mod I Expected length of stay: 7-10 days Pt/Family Agrees to Admission and willing to participate: Yes Program Orientation Provided & Reviewed with Pt/Caregiver Including Roles  & Responsibilities: Yes   Decrease burden of Care through IP rehab  admission: Not anticipated   Possible need for SNF placement upon discharge: Not anticipated   Patient Condition: I have reviewed medical records from [facility] , spoken with CM, and patient. I met with patient at the bedside for inpatient rehabilitation assessment.  Patient will benefit from ongoing PT and OT, can actively participate in 3 hours of therapy a day 5 days of the week, and can make measurable gains during the admission.  Patient will also benefit from the coordinated team approach during an Inpatient Acute Rehabilitation admission.  The patient will receive intensive therapy as well as Rehabilitation physician, nursing, social worker, and care management interventions.  Due to safety, skin/wound care, disease management, medication administration, pain management, and patient education the patient requires 24 hour a day rehabilitation nursing.  The patient is currently Min A to CGA with mobility and basic ADLs.  Discharge setting and therapy post discharge at home with home health is anticipated.  Patient has agreed to participate in the Acute Inpatient Rehabilitation Program and will admit tomorrow.   Preadmission Screen Completed By:  Leita KATHEE Kleine, 11/14/2023 9:40 AM ______________________________________________________________________   Discussed status with Dr. Urbano on 11/15/23 at 1014 and received approval for admission today.   Admission Coordinator:  Leita KATHEE Kleine, CCC-SLP, time 1015/Date 11/15/23    Assessment/Plan: Diagnosis: severe aortic insufficiency S/P aortic valve replacement. Patient had postoperative seizures and encephalopathy  Does the need for close, 24 hr/day Medical supervision in concert with the patient's rehab needs make it unreasonable for this patient to be served in a less intensive setting? Yes Co-Morbidities requiring supervision/potential complications: Seizure disorder, anemia, thrombocytopenia, CKD, A-fib, diabetes, chronic  heart failure, hypertension, COPD Due to bladder management, bowel management, safety, skin/wound care, disease management, medication administration, pain management, and patient education, does the patient require 24 hr/day rehab nursing? Yes Does the patient require coordinated care of a physician, rehab nurse, PT, OT, and SLP to address physical and functional deficits in the context of the above medical diagnosis(es)? Yes Addressing deficits in the following areas: balance, endurance, locomotion, strength, transferring, bowel/bladder control, bathing, dressing, feeding, grooming, toileting, and psychosocial support Can the patient actively participate in an intensive therapy program of at least 3 hrs of therapy 5 days a week? Yes The potential for patient to make measurable gains while on inpatient rehab is excellent Anticipated functional outcomes upon discharge from inpatient rehab: modified independent PT, modified independent OT, modified independent and n/a SLP Estimated rehab length of stay to reach the above functional goals is: 7-10 Anticipated discharge destination: Home 10. Overall Rehab/Functional Prognosis: excellent     MD Signature: Murray Urbano

## 2023-11-15 NOTE — Progress Notes (Signed)
 CARDIAC REHAB PHASE I   Post OHS education including site care, restrictions, heart healthy diabetic diet, sternal precautions, IS use, exercise guidelines. All questions and concerns addressed. Patient being discharged to CIR. 1400-1430  Isaiah JAYSON Liverpool, RN BSN 11/15/2023 2:26 PM

## 2023-11-15 NOTE — Progress Notes (Addendum)
 [address]           Michael Bender [place], [place] [address]                     [phone]        7 Days Post-Op Procedure(s) (LRB): AORTIC VALVE REPLACEMENT USING A INSPIRIS VALVE. (N/A) CLIPPING OF LEFT ATRIAL APPENDAGE USING A CLIP (N/A) ECHOCARDIOGRAM, TRANSESOPHAGEAL, INTRAOPERATIVE (N/A)  Subjective: Patient sleeping and briefly awakened this am. He has moved his bowels a few times.  Objective: Vital signs in last 24 hours: Temp:  [98 F (36.7 C)-98.4 F (36.9 C)] 98.3 F (36.8 C) (10/15 0401) Pulse Rate:  [82-124] 82 (10/15 0401) Cardiac Rhythm: Normal sinus rhythm (10/14 2017) Resp:  [14-19] 19 (10/15 0401) BP: (112-168)/(67-81) 160/74 (10/15 0401) SpO2:  [91 %-99 %] 91 % (10/15 0401) Weight:  [63 kg] 63 kg (10/15 0617)  Pre op weight 62.1 kg Current Weight  11/15/23 63 kg      Intake/Output from previous day: 10/14 0701 - 10/15 0700 In: -  Out: 350 [Urine:350]   Physical Exam:  Cardiovascular: RRR, no murmur Pulmonary: Clear to auscultation bilaterally Abdomen: Soft, non tender, bowel sounds present. Extremities: No lower extremity edema. Wound: Clean and dry.  No erythema or signs of infection.  Lab Results: CBC: Recent Labs    11/13/23 0443 11/14/23 0412  WBC 5.8 5.2  HGB 10.5* 10.2*  HCT 31.5* 31.1*  PLT 90* 104*   BMET:  Recent Labs    11/14/23 0412 11/15/23 0408  NA 139 139  K 4.2 3.7  CL 105 107  CO2 22 22  GLUCOSE 142* 152*  BUN 40* 37*  CREATININE 2.09* 1.90*  CALCIUM  7.6* 7.7*    PT/INR:  Lab Results  Component Value Date   INR 1.6 (H) 11/08/2023   INR 1.1 07/19/2019   ABG:  INR: Will add last result for INR, ABG once components are confirmed Will add last 4 CBG results once components are confirmed  Assessment/Plan:  1. CV - History of atrial fibrillation. Appears to have had brief a fib yesterday. He had LA clip placed at surgery. SR and hypertensive this am. On Amiodarone  400 mg bid, Lopressor  increased to 50 mg bid this am, and Amlodipine  increased to 10 mg this am 2.  Pulmonary - History of COPD. On Duo neb PRN. On room air. Encourage incentive spirometer. 3. History of acute on chronic diastolic heart failure-on Lasix  40 mg daily. He was on Spironolactone  and Entresto  prior to surgery but not restarted yet secondary to creatinine. 4.  Expected post op acute blood loss anemia - H and H yesterday stable at 10.2 and 31.1 5. AKI on CKD (stage IIIb)-creatinine this am decreased to 1.9. Baseline appears to be around 2. 6. DM-CBGs 180/169/133. On Insulin  and Jardiance . Pre op HGA1C He will need close medical follow up after discharge. 7. History of seizure-continue Keppra 500 mg bid 8. Thrombocytopenia-platelets yesterday up to 104,000 9. Gently supplement potassium (3.7) 10. Disposition-awaiting insurance approval for CIR  [name] ZimmermanPA-C 6:56 AM  Feeling better this morning after having more bowel movements.  Ate his entire breakfast.  Couple hours of Afib yesterday but back in NSR today.  Will hold off on therapeutic AC given his appendage is clipped and he's high risk for blood thinner.  Kidneys back to baseline, weight  pretty much back to pre-op status. Plan for CIR once insurance is approved.  Con Clunes, MD Cardiothoracic Surgery Pager: [phone]

## 2023-11-15 NOTE — H&P (Signed)
 Physical Medicine and Rehabilitation Admission H&P    Chief Complaint  Patient presents with   Functional deficits due to Severe Aortic Regurgitation s/p AVR    HPI: Michael Bender is a 78 year old male with PMHx of hypertension, hyperlipidemia, uncontrolled T2DM (last HgbA1C 8.4), paroxysmal atrial fibrillation not on anticoagulation due to falls, BPH, history of GI bleed due to duodenal ulcer, AKI on CKD stage IIIb, peripheral arterial disease, chronic iron deficiency anemia, prior tobacco abuse quit 5 years ago, COPD, combined systolic and diastolic heart failure, and degenerative spine disease S/P multiple surgeries.  He was seen by cardiothoracic surgery on 08/30/2023 with plan for open surgical aortic valve replacement as patient was not a candidate for TAVR.  He presented to [facility] 11/01/23 with complaints of exertional dyspnea barely able to walk 5 feet without shortness of breath and overall weakness.  ED labs: Sodium bicarb 18, BUN 48, serum creatinine 3.0, proBNP 634, and troponin 67> 62 likely due to demand ischemia. Abnormal chest x-ray showing possible small nodular opacity over the right mid to lower lung with recommended chest x-ray in 3 months along with noncontrast chest CT.  EKG negative for acute acute ischemia and showed NSR with prolonged QTc.  Blood cultures were collected and showed no growth.  An echo revealed LVEF 60 to 65% and mild aortic regurgitation but the aortic valve is not well-visualized.  Cardiac MRI suggestive of severe AI.  The patient underwent an aortic valve replacement with a 21 mm Edwards Inspiris bioprosthetic valve on 11/08/2023 by Dr. Con Clunes.  Following the procedure, patient was admitted to surgical ICU and was hemodynamically stable. He was weaned from the ventilator per protocol but noted to have decreased consciousness, disorientation and not following commands. Hospital course complicated by right facial droop along with  bilateral arm and lower extremity weakness and right side neglect. CT scan head/neck was negative for acute findings. Neurology consulted and he was loaded with Keppra for suspected seizure activity. An EEG revealed no seizures or epileptiform discharges. There was evidence of generalized cerebral dysfunction/encephalopathy felt to be related to sedation. Neurology recommended MRI of brain on 10/10/205 that showed moderately severe chronic small vessel ischemic disease but not acute abnormality.  The patient developed atrial fibrillation with RVR and was started on IV amiodarone. AKI on CKD stage 3b, creatinine is not improved. He was maintained on Heparin  for DVT prophylaxis. Pt reports poor sleep at night. Constipation treated with laxatives with improvement.  Per chart review the patient currently lives alone in a 1 level home and was able to complete ADLs with frequent rest/sit breaks. He currently requires Min A to CGA with mobility and basic ADLs. Therapy evaluations completed due to patient decreased functional mobility was admitted for a comprehensive rehab program.   Review of Systems  Constitutional:  Negative for chills and fever.  HENT:  Positive for hearing loss (chronic HOH).   Eyes:  Negative for blurred vision and double vision.  Respiratory:  Positive for cough. Negative for shortness of breath.   Cardiovascular:  Negative for chest pain and palpitations.       Chest wall soreness  Gastrointestinal:  Positive for constipation (improved with laxatives). Negative for abdominal pain, diarrhea, nausea and vomiting.  Genitourinary: Negative.   Musculoskeletal:  Negative for back pain, joint pain and neck pain.  Skin:  Negative for rash.  Neurological:  Negative for tingling, tremors, sensory change, speech change and focal weakness.  Psychiatric/Behavioral:  Negative for depression. The patient has insomnia.     Past Medical History:  Diagnosis Date   Acute combined systolic and  diastolic heart failure (HCC) 04/22/2023   Acute on chronic diastolic CHF (congestive heart failure) (HCC) 04/20/2023   AKI (acute kidney injury) 10/28/2022   Arthritis    Back   Balance problems 02/21/2023   Blood transfusion    as a child   Cachexia 05/03/2022   Added based on temporal wasting noted on exam, Body mass index is 19.92 kg/m. At 05/03/2022 encounter     Cervical discitis 11/10/2022   Chronic back pain    COPD (chronic obstructive pulmonary disease) (HCC)    Disturbance of skin sensation 05/22/2018   Effusion of right knee 02/21/2023   Effusion of right knee joint 01/16/2023   Elevated troponin 04/22/2023   Essential hypertension 03/05/2007   Qualifier: Diagnosis of   By: Krystal MD, Reyes LABOR      Finger pain, left 11/08/2022   Gastric erosion    Gastritis and gastroduodenitis    Gastrointestinal hemorrhage with melena 11/20/2018   GIB (gastrointestinal bleeding) 07/18/2019   Hardware complicating wound infection 11/10/2022   Headache(784.0)    last one 6 months ago   Hemorrhoids, internal    High risk medication use 03/03/2022   History of duodenal ulcer    History of fusion of cervical spine 03/03/2022   With persistent cervical pain and palpable screws  Led to disability  History of attempt to dig furrow in skull to fix it  Last surgery 1992     History of lumbar fusion 03/03/2022   RE-OPERATIVE DIAGNOSIS:  lumbar stenosis synovial cyst lumbar spondylosis spondylolisthesis lumbar radiculoapthy L4/5   PROCEDURE:  Procedure(s): POSTERIOR LUMBAR FUSION 1 LEVEL with resection of synovial cyst     History of upper gastrointestinal bleeding 03/03/2022   Duodenal ulcer 2021 Dr. Avram   HLD (hyperlipidemia)    Hypertension    Hypokalemia 04/22/2023   Hypomagnesemia 04/22/2023   Infected blister of left index finger 11/07/2022   Intractable pain 03/03/2022   Loss of weight    Wt Readings from Last 10 Encounters:  04/05/22  146 lb (66.2 kg)  03/03/22  143 lb 9.6  oz (65.1 kg)  07/14/21  153 lb (69.4 kg)  12/14/20  147 lb 12.8 oz (67 kg)  11/13/19  154 lb (69.9 kg)  09/16/19  146 lb (66.2 kg)  08/06/19  146 lb (66.2 kg)  07/19/19  144 lb 13.5 oz (65.7 kg)  07/17/19  148 lb (67.1 kg)  07/09/19  147 lb 9.6 oz (67 kg)         MSSA bacteremia 11/07/2022   Neck rigidity    post cervical fusion   Nocturia    PAIN, CHRONIC NEC 10/06/2006   Qualifier: Diagnosis of   By: Krystal RN, Leeroy       Pneumonia    Prostate disease    Right sided temporal headache 05/22/2018   S/P cervical spinal fusion 03/03/2022   With persistent cervical pain and palpable screws Led to disability History of attempt to dig furrow in skull to fix it Last surgery 1992   Senile ecchymosis 01/21/2019   Septic arthritis of wrist, left (HCC) 11/08/2022   Septic infrapatellar bursitis of right knee 11/07/2022   Staphylococcal Arthritis of Right Knee (Updated 01/30/2023) MRI 01/17/2023 shows worsening findings:  Worsening tear of posterior horn medial meniscus with large radial component Worsening subcortical stress fracture/osteochondral lesion of medial femoral condyle  New subcortical stress fracture/osteochondral lesion of medial tibial plateau Moderate-to-large effusion with worsened synovitis    Staphylococcal arthritis of left wrist (HCC) 11/07/2022   Staphylococcal arthritis of right knee (HCC) 11/08/2022   Syncope 11/05/2022   Underweight on examination 05/03/2022   Past Surgical History:  Procedure Laterality Date   AORTIC VALVE REPLACEMENT N/A 11/08/2023   Procedure: AORTIC VALVE REPLACEMENT USING A INSPIRIS VALVE.;  Surgeon: [name], MD;  Location: [facility] OR;  Service: Open Heart Surgery;  Laterality: N/A;   BIOPSY  07/19/2019   Procedure: BIOPSY;  Surgeon: San Sandor GAILS, DO;  Location: [facility];  Service: Gastroenterology;;   CERVICAL FUSION  1992   C2/C 3  four surgeries   CLIPPING OF ATRIAL APPENDAGE N/A 11/08/2023   Procedure: CLIPPING OF LEFT ATRIAL  APPENDAGE USING A CLIP;  Surgeon: [name], MD;  Location: [facility] OR;  Service: Open Heart Surgery;  Laterality: N/A;   COLONOSCOPY     ESOPHAGOGASTRODUODENOSCOPY  07/20/2011   Procedure: ESOPHAGOGASTRODUODENOSCOPY (EGD);  Surgeon: Lupita FORBES Commander, MD;  Location: [facility];  Service: Endoscopy;  Laterality: N/A;   ESOPHAGOGASTRODUODENOSCOPY (EGD) WITH PROPOFOL  N/A 07/19/2019   Procedure: ESOPHAGOGASTRODUODENOSCOPY (EGD) WITH PROPOFOL ;  Surgeon: San Sandor GAILS, DO;  Location: [facility];  Service: Gastroenterology;  Laterality: N/A;   I & D EXTREMITY Left 11/09/2022   Procedure: IRRIGATION AND DEBRIDEMENT LEFT WRIST;  Surgeon: Arlinda Buster, MD;  Location: [facility] OR;  Service: Orthopedics;  Laterality: Left;   INTRAOPERATIVE TRANSESOPHAGEAL ECHOCARDIOGRAM N/A 11/08/2023   Procedure: ECHOCARDIOGRAM, TRANSESOPHAGEAL, INTRAOPERATIVE;  Surgeon: [name], MD;  Location[facility] OR;  Service: Open Heart Surgery;  Laterality: N/A;   RIGHT/LEFT HEART CATH AND CORONARY ANGIOGRAPHY N/A 11/06/2023   Procedure: RIGHT/LEFT HEART CATH AND CORONARY ANGIOGRAPHY;  Surgeon: Wonda Sharper, MD;  Location: [facility] INVASIVE CV LAB;  Service: Cardiovascular;  Laterality: N/A;   SAVORY DILATION  07/20/2011   Procedure: SAVORY DILATION;  Surgeon: Lupita FORBES Commander, MD;  Location: [facility];  Service: Endoscopy;  Laterality: N/A;   SPINAL FUSION  05/06/11   TRANSESOPHAGEAL ECHOCARDIOGRAM (CATH LAB) N/A 07/24/2023   Procedure: TRANSESOPHAGEAL ECHOCARDIOGRAM;  Surgeon: Pietro Redell RAMAN, MD;  Location: [facility] INVASIVE CV LAB;  Service: Cardiovascular;  Laterality: N/A;   Family History  Problem Relation Age of Onset   Heart disease Mother    Heart attack Mother 43   Pancreatic cancer Father 40   Pancreatic cancer Brother    Anesthesia problems Neg Hx    Colon cancer Neg Hx    Liver cancer Neg Hx    Stomach cancer Neg Hx    Esophageal cancer Neg Hx    Rectal cancer Neg Hx    Social History:  reports that he quit smoking  about 4 years ago. His smoking use included cigarettes. He started smoking about 44 years ago. He has never used smokeless tobacco. He reports that he does not currently use alcohol after a past usage of about 2.0 standard drinks of alcohol per week. He reports that he does not use drugs. Allergies:  Allergies  Allergen Reactions   Nubain [Nalbuphine Hcl]     Muscle contraction   Medications Prior to Admission  Medication Sig Dispense Refill   amitriptyline  (ELAVIL ) 50 MG tablet Take 1 tablet (50 mg total) by mouth at bedtime. 90 tablet 3   amLODipine  (NORVASC ) 5 MG tablet Take 1 tablet (5 mg total) by mouth daily. 30 tablet 0   bisoprolol  (ZEBETA ) 5 MG tablet Take  1 tablet (5 mg total) by mouth daily. 90 tablet 1   cyclobenzaprine  (FLEXERIL ) 10 MG tablet Take 1 tablet (10 mg total) by mouth 3 (three) times daily as needed. 270 tablet 3   ipratropium-albuterol  (DUONEB) 0.5-2.5 (3) MG/3ML SOLN Take 3 mLs by nebulization every 4 (four) hours as needed. 90 mL 1   Melatonin 1 MG CAPS Take 1 capsule (1 mg total) by mouth at bedtime. 90 capsule 30   metoprolol  succinate (TOPROL -XL) 25 MG 24 hr tablet Take 25 mg by mouth daily.     ondansetron  (ZOFRAN -ODT) 4 MG disintegrating tablet Take 1 tablet (4 mg total) by mouth every 8 (eight) hours as needed for nausea or vomiting. 60 tablet 2   Oxycodone  HCl 10 MG TABS Take 1 tablet (10 mg total) by mouth every 8 (eight) hours as needed. 90 tablet 0   pantoprazole  (PROTONIX ) 40 MG tablet Take 1 tablet (40 mg total) by mouth daily. 90 tablet 3   polyethylene glycol (MIRALAX  / GLYCOLAX ) 17 g packet Take 17 g by mouth daily as needed for moderate constipation. 14 each 0   rosuvastatin  (CRESTOR ) 40 MG tablet TAKE 1 TABLET BY MOUTH DAILY. REPLACES ATORVASTATIN  (STOP ATORVASTATIN  IF STILL TAKING) 90 tablet 3   sacubitril -valsartan  (ENTRESTO ) 24-26 MG Take 1 tablet by mouth 2 (two) times daily. (Patient taking differently: Take 0.5 tablets by mouth daily.) 180 tablet  3   Semaglutide ,0.25 or 0.5MG /DOS, (OZEMPIC , 0.25 OR 0.5 MG/DOSE,) 2 MG/3ML SOPN Inject 0.25 mg into the skin once a week. 3 mL 5   spironolactone  (ALDACTONE ) 25 MG tablet Take 25 mg by mouth daily.     SUMAtriptan  (IMITREX ) 50 MG tablet TAKE 1 TABLET (50 MG TOTAL) BY MOUTH DAILY. MAY REPEAT IN 2 HOURS IF HEADACHE PERSISTS OR RECURS. 9 tablet 1   bisacodyl  5 MG EC tablet Take 1 tablet (5 mg total) by mouth daily as needed for moderate constipation. (Patient not taking: Reported on 11/02/2023) 30 tablet 0   Blood Glucose Monitoring Suppl DEVI 1 each by Does not apply route in the morning, at noon, and at bedtime. May substitute to any manufacturer covered by patient's insurance. 1 each 0   cefadroxil  (DURICEF) 500 MG capsule Take 500 mg by mouth 2 (two) times daily. (Patient not taking: Reported on 11/02/2023)     Continuous Glucose Sensor (FREESTYLE LIBRE 3 PLUS SENSOR) MISC Change sensor every 15 days. 2 each 11   FeFum-FePoly-FA-B Cmp-C-Biot (INTEGRA PLUS ) CAPS Take 1 capsule by mouth daily. (Patient not taking: Reported on 11/02/2023) 30 capsule 5   hydrALAZINE  (APRESOLINE ) 50 MG tablet Take 1 tablet (50 mg total) by mouth every 8 (eight) hours. (Patient not taking: Reported on 11/02/2023) 90 tablet 0   isosorbide  mononitrate (IMDUR ) 60 MG 24 hr tablet Take 1 tablet (60 mg total) by mouth daily. (Patient not taking: Reported on 11/02/2023) 30 tablet 0   JARDIANCE  10 MG TABS tablet Take 10 mg by mouth every morning. (Patient not taking: Reported on 11/02/2023)     predniSONE (DELTASONE) 10 MG tablet Take 10 mg by mouth 3 (three) times daily. (Patient not taking: Reported on 11/02/2023)     promethazine  (PHENERGAN ) 25 MG suppository Place 1 suppository (25 mg total) rectally every 6 (six) hours as needed for nausea or vomiting. (Patient not taking: Reported on 11/02/2023) 12 each 0   Respiratory Therapy Supplies (NEBULIZER/TUBING/MOUTHPIECE) KIT 1 each by Does not apply route every 4 (four) hours as needed. 1  kit 1  Respiratory Therapy Supplies (NEBULIZER/TUBING/MOUTHPIECE) KIT 1 each by Does not apply route every 4 (four) hours as needed. 1 kit 1   sodium zirconium cyclosilicate  (LOKELMA ) 10 g PACK packet Take 10 g by mouth daily. (Patient not taking: Reported on 11/02/2023) 7 packet 0   [DISCONTINUED] Glucose Blood (BLOOD GLUCOSE TEST STRIPS) STRP 1 each by In Vitro route in the morning, at noon, and at bedtime. May substitute to any manufacturer covered by patient's insurance. 100 strip 0   [DISCONTINUED] Lancet Device MISC 1 each by Does not apply route in the morning, at noon, and at bedtime. May substitute to any manufacturer covered by patient's insurance. 1 each 0   [DISCONTINUED] Lancets Misc. MISC 1 each by Does not apply route in the morning, at noon, and at bedtime. May substitute to any manufacturer covered by patient's insurance. 100 each 0   [DISCONTINUED] rOPINIRole  (REQUIP ) 0.25 MG tablet Take 1 tablet (0.25 mg total) by mouth 3 (three) times daily. (Patient taking differently: Take 0.25 mg by mouth 3 (three) times daily as needed (restless leg).) 30 tablet 2      Home: Home Living Family/patient expects to be discharged to:: Private residence Living Arrangements: Alone Available Help at Discharge: Family, Available PRN/intermittently (sons) Type of Home: House Home Access: Level entry Home Layout: One level Bathroom Shower/Tub: Engineer, manufacturing systems: Standard Bathroom Accessibility: Yes Home Equipment: Grab bars - tub/shower, Grab bars - toilet, Hand held shower head, Other (comment), Rolling Walker (2 wheels), Cane - single point, BSC/3in1, Shower seat (walking stick)   Functional History: Prior Function Prior Level of Function : Independent/Modified Independent, Needs assist Mobility Comments: Ambulates using a walking stick. Denies falls in the last 2mo. ADLs Comments: Mod I to ind mostly., Sons help with finances and errnads/driving, pt manages own  medications  Functional Status:  Mobility: Bed Mobility Overal bed mobility: Needs Assistance Bed Mobility: Supine to Sit, Sit to Supine Supine to sit: Contact guard Sit to supine: Contact guard assist General bed mobility comments: CGA for safety, a bit impulsive to rise without awareness of sternal precautions. Needs cues throughout and review of techniques to rise and lower, encouraged use of heart pillo at all times to remember. Transfers Overall transfer level: Needs assistance Equipment used: Rolling walker (2 wheels), None Transfers: Sit to/from Stand Sit to Stand: Contact guard assist General transfer comment: CGA for safety with increased sway, bracing legs agains bed for support. Practiced with and without RW, cues for hand placement on heart pillow to reinforce sternal precautions. Ambulation/Gait Ambulation/Gait assistance: Min assist Gait Distance (Feet): 310 Feet Assistive device: Rolling walker (2 wheels), None Gait Pattern/deviations: Step-through pattern, Narrow base of support, Decreased stride length, Staggering right General Gait Details: Min assist without assistive device due to LOB, delayed righting reflexes. Cues for awareness. Provided RW and maintains better stability with CGA. Still shows some erratic control of RW and needs cued not to pick RW up and only apply light pressure through hands. Challenged with head turns and nods. Gait velocity: decreased Gait velocity interpretation: <1.8 ft/sec, indicate of risk for recurrent falls    ADL: ADL Overall ADL's : Needs assistance/impaired Eating/Feeding: Set up, Sitting Grooming: Set up, Standing, Oral care Upper Body Bathing: Set up, Supervision/ safety, Sitting Lower Body Bathing: Moderate assistance, Sit to/from stand Upper Body Dressing : Sitting, Moderate assistance Lower Body Dressing: Minimal assistance, Sit to/from stand Toilet Transfer: Contact guard assist, Ambulation, Rolling walker (2 wheels),  Regular Toilet Toileting- Clothing Manipulation and Hygiene:  Minimal assistance Functional mobility during ADLs: Contact guard assist, Rolling walker (2 wheels)  Cognition: Cognition Orientation Level: Oriented X4 Cognition Arousal: Alert Behavior During Therapy: WFL for tasks assessed/performed  Physical Exam: Blood pressure (!) 162/78, pulse 85, temperature (!) 97.5 F (36.4 C), temperature source Oral, resp. rate 20, height 5' 11 (1.803 m), weight 63 kg, SpO2 95%.  General: No apparent distress HEENT: Head is normocephalic, atraumatic, sclera anicteric, oral mucosa pink and moist Neck: Supple  Heart: Reg rate and rhythm. No murmurs rubs or gallops Chest: CTA bilaterally without wheezes, rales, or rhonchi; no distress Abdomen: Soft, non-tender, non-distended, bowel sounds positive. Extremities: No clubbing, cyanosis, or edema.  Psych: Pt's affect is appropriate. Pt is cooperative Skin: sternal and abdominal incisions CDI, bruising in UEs Neuro:     Mental Status: AAOx4, memory intact, Good insight and awareness Speech/Languate: Fluent, follows simple commands CRANIAL NERVES: II: PERRL. Visual fields full III, IV, VI: EOM intact, no gaze preference or deviation V: normal sensation bilaterally VII: no asymmetry VIII: normal hearing to speech IX, X: normal palatal elevation XI: 5/5 head turn and 5/5 shoulder shrug bilaterally XII: Tongue midline     MOTOR: RUE: at least 4/5 LUE:at least 4/5  RLE: HF 5/5, KE 5/5, ADF 5/5, APF 5/5 LLE: HF 5/5, KE 5/5, ADF 5/5, APF 5/5 Sternal precautions limits UE exam   REFLEXES: hyporeflexive b/l LE   SENSORY: Normal to touch all 4 extremities   Coordination: Normal finger to nose b/l, no tremor    Results for orders placed or performed during the hospital encounter of 11/01/23 (from the past 48 hours)  Glucose, capillary     Status: Abnormal   Collection Time: 11/13/23  4:08 PM  Result Value Ref Range   Glucose-Capillary 166  (H) 70 - 99 mg/dL    Comment: Glucose reference range applies only to samples taken after fasting for at least 8 hours.  Basic metabolic panel with GFR     Status: Abnormal   Collection Time: 11/14/23  4:12 AM  Result Value Ref Range   Sodium 139 135 - 145 mmol/L   Potassium 4.2 3.5 - 5.1 mmol/L   Chloride 105 98 - 111 mmol/L   CO2 22 22 - 32 mmol/L   Glucose, Bld 142 (H) 70 - 99 mg/dL    Comment: Glucose reference range applies only to samples taken after fasting for at least 8 hours.   BUN 40 (H) 8 - 23 mg/dL   Creatinine, Ser 7.90 (H) 0.61 - 1.24 mg/dL   Calcium  7.6 (L) 8.9 - 10.3 mg/dL   GFR, Estimated 32 (L) >60 mL/min    Comment: (NOTE) Calculated using the CKD-EPI Creatinine Equation (2021)    Anion gap 12 5 - 15    Comment: Performed at [facility], [address] [place], [place] [address]  Magnesium      Status: None   Collection Time: 11/14/23  4:12 AM  Result Value Ref Range   Magnesium  2.0 1.7 - 2.4 mg/dL    Comment: Performed at [facility], [address] [place], [place] [address]  CBC     Status: Abnormal   Collection Time: 11/14/23  4:12 AM  Result Value Ref Range   WBC 5.2 4.0 - 10.5 K/uL   RBC 3.48 (L) 4.22 - 5.81 MIL/uL   Hemoglobin 10.2 (L) 13.0 - 17.0 g/dL   HCT 68.8 (L) 60.9 - 47.9 %   MCV 89.4 80.0 - 100.0 fL  MCH 29.3 26.0 - 34.0 pg   MCHC 32.8 30.0 - 36.0 g/dL   RDW 82.9 (H) 88.4 - 84.4 %   Platelets 104 (L) 150 - 400 K/uL   nRBC 0.0 0.0 - 0.2 %    Comment: Performed at [facility], [address] [place], [place] [address]  Glucose, capillary     Status: Abnormal   Collection Time: 11/14/23  7:38 AM  Result Value Ref Range   Glucose-Capillary 135 (H) 70 - 99 mg/dL    Comment: Glucose reference range applies only to samples taken after fasting for at least 8 hours.  Glucose, capillary     Status: Abnormal   Collection Time: 11/14/23 12:08 PM  Result Value Ref Range   Glucose-Capillary 177 (H) 70 - 99 mg/dL    Comment:  Glucose reference range applies only to samples taken after fasting for at least 8 hours.  Glucose, capillary     Status: Abnormal   Collection Time: 11/14/23  5:05 PM  Result Value Ref Range   Glucose-Capillary 180 (H) 70 - 99 mg/dL    Comment: Glucose reference range applies only to samples taken after fasting for at least 8 hours.  Glucose, capillary     Status: Abnormal   Collection Time: 11/14/23  9:06 PM  Result Value Ref Range   Glucose-Capillary 169 (H) 70 - 99 mg/dL    Comment: Glucose reference range applies only to samples taken after fasting for at least 8 hours.   Comment 1 Notify RN    Comment 2 Document in Chart   Basic metabolic panel with GFR     Status: Abnormal   Collection Time: 11/15/23  4:08 AM  Result Value Ref Range   Sodium 139 135 - 145 mmol/L   Potassium 3.7 3.5 - 5.1 mmol/L   Chloride 107 98 - 111 mmol/L   CO2 22 22 - 32 mmol/L   Glucose, Bld 152 (H) 70 - 99 mg/dL    Comment: Glucose reference range applies only to samples taken after fasting for at least 8 hours.   BUN 37 (H) 8 - 23 mg/dL   Creatinine, Ser 8.09 (H) 0.61 - 1.24 mg/dL   Calcium  7.7 (L) 8.9 - 10.3 mg/dL   GFR, Estimated 36 (L) >60 mL/min    Comment: (NOTE) Calculated using the CKD-EPI Creatinine Equation (2021)    Anion gap 10 5 - 15    Comment: Performed at [facility], [address] [place], [place] [address]  Glucose, capillary     Status: Abnormal   Collection Time: 11/15/23  6:13 AM  Result Value Ref Range   Glucose-Capillary 133 (H) 70 - 99 mg/dL    Comment: Glucose reference range applies only to samples taken after fasting for at least 8 hours.   Comment 1 Notify RN    Comment 2 Document in Chart   Glucose, capillary     Status: Abnormal   Collection Time: 11/15/23 11:16 AM  Result Value Ref Range   Glucose-Capillary 223 (H) 70 - 99 mg/dL    Comment: Glucose reference range applies only to samples taken after fasting for at least 8 hours.   DG Chest 2 View Result  Date: 11/14/2023 CLINICAL DATA:  Status post cardiac surgery. EXAM: CHEST - 2 VIEW COMPARISON:  11/13/2023 FINDINGS: Lungs are hyperexpanded. No pneumothorax or substantial pleural effusion. No focal consolidation or evidence of pulmonary edema. The cardiopericardial silhouette is within normal limits for size. Right PICC  line tip overlies the mid to lower SVC level. Telemetry leads overlie the chest. IMPRESSION: Hyperexpansion without acute cardiopulmonary findings. Electronically Signed   By: Camellia Candle M.D.   On: 11/14/2023 05:44      Blood pressure (!) 162/78, pulse 85, temperature (!) 97.5 F (36.4 C), temperature source Oral, resp. rate 20, height 5' 11 (1.803 m), weight 63 kg, SpO2 95%.  Medical Problem List and Plan: 1. Functional deficits secondary to severe aortic insufficiency s/p AVR. Pt with postoperative seizures and encephalopathy.  Sternal precautions.   -patient may shower, cover incisions please  -ELOS/Goals: 7 days, modI with PT/OT  -Admit to CIR 2.  Antithrombotics: -DVT/anticoagulation:  Mechanical: Sequential compression devices, below knee Bilateral lower extremities Pharmaceutical: Lovenox  and Heparin   -antiplatelet therapy: Asprin  3. Pain Management: Oxycodone  prn--Headache: Imitrex  prn  4. Mood/Behavior/Sleep: LCSW to follow for evaluation and support when available.   -antipsychotic agents: n/a  -insomnia: melatonin 3 mg at bedtime, Trazodone PRN 5. Neuropsych/cognition: This patient is capable of making decisions on his own behalf. 6. Skin/Wound Care: Sternal Precautions- monitor chest tube sites and incisions for s/s of infection. 7. Fluids/Electrolytes/Nutrition: Strict I&O with daily weights.   -Carb modified + ensure supplement and multivitamin   -GERD: PPI 8. Acute on chronic diastolic HF: Lasix  40 mg daily.  Spironolactone  and Entresto  not resumed d/t creatinine.  Daily weights 9. AKI CKD IIIb: (Baseline 1.7-2) now 1.9. Monitor CMP  - Lasix  40 mg  daily has been restarted 10. A. Fib: post op issue- now on Amiodarone 400 mg bid, Lopressor  50 mg bid and Amlodipine  10 mg. Monitor and supplement potassium as needed.  11. ABLA: post op acute anemia- H&H stable 10.2/31.1. Monitor CBC  12. T2DM: Hemoglobin A1c 7.7 11/12/23  -monitor CBGs ac/hs with SSI. Continue Jardiance   13. Hx of seizures: Keppra 500 mg bid  14. Thrombocytopenia- monitor platelets currently 104k  15. COPD: stable on RA.  Duo neb prn and incentive spirometer  16. Constipation: Colace and Miralax  daily  17. HLD: Crestor  40 mg  18. RLS: Requip  0.25 mg prn     Daphne LOISE Satterfield, NP 11/15/2023  I have personally performed a face to face diagnostic evaluation of this patient and formulated the key components of the plan.  Additionally, I have personally reviewed laboratory data, imaging studies, as well as relevant notes and concur with the physician assistant's documentation above.  The patient's status has not changed from the original H&P.  Any changes in documentation from the acute care chart have been noted above.  Murray Collier, MD

## 2023-11-15 NOTE — H&P (Addendum)
 Physical Medicine and Rehabilitation Admission H&P        Chief Complaint  Patient presents with   Functional deficits due to Severe Aortic Regurgitation s/p AVR     HPI: Michael Bender is a 78 year old male with PMHx of hypertension, hyperlipidemia, uncontrolled T2DM (last HgbA1C 8.4), paroxysmal atrial fibrillation not on anticoagulation due to falls, BPH, history of GI bleed due to duodenal ulcer, AKI on CKD stage IIIb, peripheral arterial disease, chronic iron deficiency anemia, prior tobacco abuse quit 5 years ago, COPD, combined systolic and diastolic heart failure, and degenerative spine disease S/P multiple surgeries.  He was seen by cardiothoracic surgery on 08/30/2023 with plan for open surgical aortic valve replacement as patient was not a candidate for TAVR.  He presented to [facility] 11/01/23 with complaints of exertional dyspnea barely able to walk 5 feet without shortness of breath and overall weakness.  ED labs: Sodium bicarb 18, BUN 48, serum creatinine 3.0, proBNP 634, and troponin 67> 62 likely due to demand ischemia. Abnormal chest x-ray showing possible small nodular opacity over the right mid to lower lung with recommended chest x-ray in 3 months along with noncontrast chest CT.  EKG negative for acute acute ischemia and showed NSR with prolonged QTc.  Blood cultures were collected and showed no growth.  An echo revealed LVEF 60 to 65% and mild aortic regurgitation but the aortic valve is not well-visualized.  Cardiac MRI suggestive of severe AI.  The patient underwent an aortic valve replacement with a 21 mm Edwards Inspiris bioprosthetic valve on 11/08/2023 by Dr. Con Clunes.  Following the procedure, patient was admitted to surgical ICU and was hemodynamically stable. He was weaned from the ventilator per protocol but noted to have decreased consciousness, disorientation and not following commands. Hospital course complicated by right facial droop along with  bilateral arm and lower extremity weakness and right side neglect. CT scan head/neck was negative for acute findings. Neurology consulted and he was loaded with Keppra for suspected seizure activity. An EEG revealed no seizures or epileptiform discharges. There was evidence of generalized cerebral dysfunction/encephalopathy felt to be related to sedation. Neurology recommended MRI of brain on 10/10/205 that showed moderately severe chronic small vessel ischemic disease but not acute abnormality.  The patient developed atrial fibrillation with RVR and was started on IV amiodarone. AKI on CKD stage 3b, creatinine is not improved. He was maintained on Heparin  for DVT prophylaxis. Pt reports poor sleep at night. Constipation treated with laxatives with improvement.  Per chart review the patient currently lives alone in a 1 level home and was able to complete ADLs with frequent rest/sit breaks. He currently requires Min A to CGA with mobility and basic ADLs. Therapy evaluations completed due to patient decreased functional mobility was admitted for a comprehensive rehab program.    Review of Systems  Constitutional:  Negative for chills and fever.  HENT:  Positive for hearing loss (chronic HOH).   Eyes:  Negative for blurred vision and double vision.  Respiratory:  Positive for cough. Negative for shortness of breath.   Cardiovascular:  Negative for chest pain and palpitations.       Chest wall soreness  Gastrointestinal:  Positive for constipation (improved with laxatives). Negative for abdominal pain, diarrhea, nausea and vomiting.  Genitourinary: Negative.   Musculoskeletal:  Negative for back pain, joint pain and neck pain.  Skin:  Negative for rash.  Neurological:  Negative for tingling, tremors, sensory  change, speech change and focal weakness.  Psychiatric/Behavioral:  Negative for depression. The patient has insomnia.           Past Medical History:  Diagnosis Date   Acute combined systolic  and diastolic heart failure (HCC) 04/22/2023   Acute on chronic diastolic CHF (congestive heart failure) (HCC) 04/20/2023   AKI (acute kidney injury) 10/28/2022   Arthritis      Back   Balance problems 02/21/2023   Blood transfusion      as a child   Cachexia 05/03/2022    Added based on temporal wasting noted on exam, Body mass index is 19.92 kg/m. At 05/03/2022 encounter     Cervical discitis 11/10/2022   Chronic back pain     COPD (chronic obstructive pulmonary disease) (HCC)     Disturbance of skin sensation 05/22/2018   Effusion of right knee 02/21/2023   Effusion of right knee joint 01/16/2023   Elevated troponin 04/22/2023   Essential hypertension 03/05/2007    Qualifier: Diagnosis of   By: Krystal MD, Reyes LABOR      Finger pain, left 11/08/2022   Gastric erosion     Gastritis and gastroduodenitis     Gastrointestinal hemorrhage with melena 11/20/2018   GIB (gastrointestinal bleeding) 07/18/2019   Hardware complicating wound infection 11/10/2022   Headache(784.0)      last one 6 months ago   Hemorrhoids, internal     High risk medication use 03/03/2022   History of duodenal ulcer     History of fusion of cervical spine 03/03/2022    With persistent cervical pain and palpable screws  Led to disability  History of attempt to dig furrow in skull to fix it  Last surgery 1992     History of lumbar fusion 03/03/2022    RE-OPERATIVE DIAGNOSIS:  lumbar stenosis synovial cyst lumbar spondylosis spondylolisthesis lumbar radiculoapthy L4/5   PROCEDURE:  Procedure(s): POSTERIOR LUMBAR FUSION 1 LEVEL with resection of synovial cyst     History of upper gastrointestinal bleeding 03/03/2022    Duodenal ulcer 2021 Dr. Avram   HLD (hyperlipidemia)     Hypertension     Hypokalemia 04/22/2023   Hypomagnesemia 04/22/2023   Infected blister of left index finger 11/07/2022   Intractable pain 03/03/2022   Loss of weight      Wt Readings from Last 10 Encounters:  04/05/22  146 lb (66.2  kg)  03/03/22  143 lb 9.6 oz (65.1 kg)  07/14/21  153 lb (69.4 kg)  12/14/20  147 lb 12.8 oz (67 kg)  11/13/19  154 lb (69.9 kg)  09/16/19  146 lb (66.2 kg)  08/06/19  146 lb (66.2 kg)  07/19/19  144 lb 13.5 oz (65.7 kg)  07/17/19  148 lb (67.1 kg)  07/09/19  147 lb 9.6 oz (67 kg)         MSSA bacteremia 11/07/2022   Neck rigidity      post cervical fusion   Nocturia     PAIN, CHRONIC NEC 10/06/2006    Qualifier: Diagnosis of   By: Krystal RN, Leeroy       Pneumonia     Prostate disease     Right sided temporal headache 05/22/2018   S/P cervical spinal fusion 03/03/2022    With persistent cervical pain and palpable screws Led to disability History of attempt to dig furrow in skull to fix it Last surgery 1992   Senile ecchymosis 01/21/2019   Septic arthritis of wrist, left (HCC) 11/08/2022  Septic infrapatellar bursitis of right knee 11/07/2022    Staphylococcal Arthritis of Right Knee (Updated 01/30/2023) MRI 01/17/2023 shows worsening findings:   Worsening tear of posterior horn medial meniscus with large radial component Worsening subcortical stress fracture/osteochondral lesion of medial femoral condyle New subcortical stress fracture/osteochondral lesion of medial tibial plateau Moderate-to-large effusion with worsened synovitis     Staphylococcal arthritis of left wrist (HCC) 11/07/2022   Staphylococcal arthritis of right knee (HCC) 11/08/2022   Syncope 11/05/2022   Underweight on examination 05/03/2022             Past Surgical History:  Procedure Laterality Date   AORTIC VALVE REPLACEMENT N/A 11/08/2023    Procedure: AORTIC VALVE REPLACEMENT USING A INSPIRIS VALVE.;  Surgeon: [name], MD;  Location: [facility] OR;  Service: Open Heart Surgery;  Laterality: N/A;   BIOPSY   07/19/2019    Procedure: BIOPSY;  Surgeon: San Sandor GAILS, DO;  Location: [facility];  Service: Gastroenterology;;   CERVICAL FUSION   1992    C2/C 3  four surgeries   CLIPPING OF ATRIAL  APPENDAGE N/A 11/08/2023    Procedure: CLIPPING OF LEFT ATRIAL APPENDAGE USING A CLIP;  Surgeon: [name], MD;  Location: [facility] OR;  Service: Open Heart Surgery;  Laterality: N/A;   COLONOSCOPY       ESOPHAGOGASTRODUODENOSCOPY   07/20/2011    Procedure: ESOPHAGOGASTRODUODENOSCOPY (EGD);  Surgeon: Lupita FORBES Commander, MD;  Location: [facility];  Service: Endoscopy;  Laterality: N/A;   ESOPHAGOGASTRODUODENOSCOPY (EGD) WITH PROPOFOL  N/A 07/19/2019    Procedure: ESOPHAGOGASTRODUODENOSCOPY (EGD) WITH PROPOFOL ;  Surgeon: San Sandor GAILS, DO;  Location: [facility];  Service: Gastroenterology;  Laterality: N/A;   I & D EXTREMITY Left 11/09/2022    Procedure: IRRIGATION AND DEBRIDEMENT LEFT WRIST;  Surgeon: Arlinda Buster, MD;  Location: [facility] OR;  Service: Orthopedics;  Laterality: Left;   INTRAOPERATIVE TRANSESOPHAGEAL ECHOCARDIOGRAM N/A 11/08/2023    Procedure: ECHOCARDIOGRAM, TRANSESOPHAGEAL, INTRAOPERATIVE;  Surgeon: [name], MD;  Location: [facility] OR;  Service: Open Heart Surgery;  Laterality: N/A;   RIGHT/LEFT HEART CATH AND CORONARY ANGIOGRAPHY N/A 11/06/2023    Procedure: RIGHT/LEFT HEART CATH AND CORONARY ANGIOGRAPHY;  Surgeon: Wonda Sharper, MD;  Location: [facility] INVASIVE CV LAB;  Service: Cardiovascular;  Laterality: N/A;   SAVORY DILATION   07/20/2011    Procedure: SAVORY DILATION;  Surgeon: Lupita FORBES Commander, MD;  Location: [facility];  Service: Endoscopy;  Laterality: N/A;   SPINAL FUSION   05/06/11   TRANSESOPHAGEAL ECHOCARDIOGRAM (CATH LAB) N/A 07/24/2023    Procedure: TRANSESOPHAGEAL ECHOCARDIOGRAM;  Surgeon: Pietro Redell RAMAN, MD;  Location: [facility] INVASIVE CV LAB;  Service: Cardiovascular;  Laterality: N/A;             Family History  Problem Relation Age of Onset   Heart disease Mother     Heart attack Mother 63   Pancreatic cancer Father 18   Pancreatic cancer Brother     Anesthesia problems Neg Hx     Colon cancer Neg Hx     Liver cancer Neg Hx     Stomach cancer Neg Hx      Esophageal cancer Neg Hx     Rectal cancer Neg Hx          Social History:  reports that he quit smoking about 4 years ago. His smoking use included cigarettes. He started smoking about 44 years ago. He has never used smokeless tobacco. He reports that he does not currently use alcohol  after a past usage of about 2.0 standard drinks of alcohol per week. He reports that he does not use drugs. Allergies:  Allergies       Allergies  Allergen Reactions   Nubain [Nalbuphine Hcl]        Muscle contraction            Medications Prior to Admission  Medication Sig Dispense Refill   amitriptyline  (ELAVIL ) 50 MG tablet Take 1 tablet (50 mg total) by mouth at bedtime. 90 tablet 3   amLODipine  (NORVASC ) 5 MG tablet Take 1 tablet (5 mg total) by mouth daily. 30 tablet 0   bisoprolol  (ZEBETA ) 5 MG tablet Take 1 tablet (5 mg total) by mouth daily. 90 tablet 1   cyclobenzaprine  (FLEXERIL ) 10 MG tablet Take 1 tablet (10 mg total) by mouth 3 (three) times daily as needed. 270 tablet 3   ipratropium-albuterol  (DUONEB) 0.5-2.5 (3) MG/3ML SOLN Take 3 mLs by nebulization every 4 (four) hours as needed. 90 mL 1   Melatonin 1 MG CAPS Take 1 capsule (1 mg total) by mouth at bedtime. 90 capsule 30   metoprolol  succinate (TOPROL -XL) 25 MG 24 hr tablet Take 25 mg by mouth daily.       ondansetron  (ZOFRAN -ODT) 4 MG disintegrating tablet Take 1 tablet (4 mg total) by mouth every 8 (eight) hours as needed for nausea or vomiting. 60 tablet 2   Oxycodone  HCl 10 MG TABS Take 1 tablet (10 mg total) by mouth every 8 (eight) hours as needed. 90 tablet 0   pantoprazole  (PROTONIX ) 40 MG tablet Take 1 tablet (40 mg total) by mouth daily. 90 tablet 3   polyethylene glycol (MIRALAX  / GLYCOLAX ) 17 g packet Take 17 g by mouth daily as needed for moderate constipation. 14 each 0   rosuvastatin  (CRESTOR ) 40 MG tablet TAKE 1 TABLET BY MOUTH DAILY. REPLACES ATORVASTATIN  (STOP ATORVASTATIN  IF STILL TAKING) 90 tablet 3    sacubitril -valsartan  (ENTRESTO ) 24-26 MG Take 1 tablet by mouth 2 (two) times daily. (Patient taking differently: Take 0.5 tablets by mouth daily.) 180 tablet 3   Semaglutide ,0.25 or 0.5MG /DOS, (OZEMPIC , 0.25 OR 0.5 MG/DOSE,) 2 MG/3ML SOPN Inject 0.25 mg into the skin once a week. 3 mL 5   spironolactone  (ALDACTONE ) 25 MG tablet Take 25 mg by mouth daily.       SUMAtriptan  (IMITREX ) 50 MG tablet TAKE 1 TABLET (50 MG TOTAL) BY MOUTH DAILY. MAY REPEAT IN 2 HOURS IF HEADACHE PERSISTS OR RECURS. 9 tablet 1   bisacodyl  5 MG EC tablet Take 1 tablet (5 mg total) by mouth daily as needed for moderate constipation. (Patient not taking: Reported on 11/02/2023) 30 tablet 0   Blood Glucose Monitoring Suppl DEVI 1 each by Does not apply route in the morning, at noon, and at bedtime. May substitute to any manufacturer covered by patient's insurance. 1 each 0   cefadroxil  (DURICEF) 500 MG capsule Take 500 mg by mouth 2 (two) times daily. (Patient not taking: Reported on 11/02/2023)       Continuous Glucose Sensor (FREESTYLE LIBRE 3 PLUS SENSOR) MISC Change sensor every 15 days. 2 each 11   FeFum-FePoly-FA-B Cmp-C-Biot (INTEGRA PLUS ) CAPS Take 1 capsule by mouth daily. (Patient not taking: Reported on 11/02/2023) 30 capsule 5   hydrALAZINE  (APRESOLINE ) 50 MG tablet Take 1 tablet (50 mg total) by mouth every 8 (eight) hours. (Patient not taking: Reported on 11/02/2023) 90 tablet 0   isosorbide  mononitrate (IMDUR ) 60 MG 24 hr tablet Take 1  tablet (60 mg total) by mouth daily. (Patient not taking: Reported on 11/02/2023) 30 tablet 0   JARDIANCE  10 MG TABS tablet Take 10 mg by mouth every morning. (Patient not taking: Reported on 11/02/2023)       predniSONE (DELTASONE) 10 MG tablet Take 10 mg by mouth 3 (three) times daily. (Patient not taking: Reported on 11/02/2023)       promethazine  (PHENERGAN ) 25 MG suppository Place 1 suppository (25 mg total) rectally every 6 (six) hours as needed for nausea or vomiting. (Patient not  taking: Reported on 11/02/2023) 12 each 0   Respiratory Therapy Supplies (NEBULIZER/TUBING/MOUTHPIECE) KIT 1 each by Does not apply route every 4 (four) hours as needed. 1 kit 1   Respiratory Therapy Supplies (NEBULIZER/TUBING/MOUTHPIECE) KIT 1 each by Does not apply route every 4 (four) hours as needed. 1 kit 1   sodium zirconium cyclosilicate  (LOKELMA ) 10 g PACK packet Take 10 g by mouth daily. (Patient not taking: Reported on 11/02/2023) 7 packet 0   [DISCONTINUED] Glucose Blood (BLOOD GLUCOSE TEST STRIPS) STRP 1 each by In Vitro route in the morning, at noon, and at bedtime. May substitute to any manufacturer covered by patient's insurance. 100 strip 0   [DISCONTINUED] Lancet Device MISC 1 each by Does not apply route in the morning, at noon, and at bedtime. May substitute to any manufacturer covered by patient's insurance. 1 each 0   [DISCONTINUED] Lancets Misc. MISC 1 each by Does not apply route in the morning, at noon, and at bedtime. May substitute to any manufacturer covered by patient's insurance. 100 each 0   [DISCONTINUED] rOPINIRole  (REQUIP ) 0.25 MG tablet Take 1 tablet (0.25 mg total) by mouth 3 (three) times daily. (Patient taking differently: Take 0.25 mg by mouth 3 (three) times daily as needed (restless leg).) 30 tablet 2              Home: Home Living Family/patient expects to be discharged to:: Private residence Living Arrangements: Alone Available Help at Discharge: Family, Available PRN/intermittently (sons) Type of Home: House Home Access: Level entry Home Layout: One level Bathroom Shower/Tub: Engineer, manufacturing systems: Standard Bathroom Accessibility: Yes Home Equipment: Grab bars - tub/shower, Grab bars - toilet, Hand held shower head, Other (comment), Rolling Walker (2 wheels), Cane - single point, BSC/3in1, Shower seat (walking stick)   Functional History: Prior Function Prior Level of Function : Independent/Modified Independent, Needs assist Mobility  Comments: Ambulates using a walking stick. Denies falls in the last 27mo. ADLs Comments: Mod I to ind mostly., Sons help with finances and errnads/driving, pt manages own medications   Functional Status:  Mobility: Bed Mobility Overal bed mobility: Needs Assistance Bed Mobility: Supine to Sit, Sit to Supine Supine to sit: Contact guard Sit to supine: Contact guard assist General bed mobility comments: CGA for safety, a bit impulsive to rise without awareness of sternal precautions. Needs cues throughout and review of techniques to rise and lower, encouraged use of heart pillo at all times to remember. Transfers Overall transfer level: Needs assistance Equipment used: Rolling walker (2 wheels), None Transfers: Sit to/from Stand Sit to Stand: Contact guard assist General transfer comment: CGA for safety with increased sway, bracing legs agains bed for support. Practiced with and without RW, cues for hand placement on heart pillow to reinforce sternal precautions. Ambulation/Gait Ambulation/Gait assistance: Min assist Gait Distance (Feet): 310 Feet Assistive device: Rolling walker (2 wheels), None Gait Pattern/deviations: Step-through pattern, Narrow base of support, Decreased stride length, Staggering right General Gait  Details: Min assist without assistive device due to LOB, delayed righting reflexes. Cues for awareness. Provided RW and maintains better stability with CGA. Still shows some erratic control of RW and needs cued not to pick RW up and only apply light pressure through hands. Challenged with head turns and nods. Gait velocity: decreased Gait velocity interpretation: <1.8 ft/sec, indicate of risk for recurrent falls   ADL: ADL Overall ADL's : Needs assistance/impaired Eating/Feeding: Set up, Sitting Grooming: Set up, Standing, Oral care Upper Body Bathing: Set up, Supervision/ safety, Sitting Lower Body Bathing: Moderate assistance, Sit to/from stand Upper Body Dressing :  Sitting, Moderate assistance Lower Body Dressing: Minimal assistance, Sit to/from stand Toilet Transfer: Contact guard assist, Ambulation, Rolling walker (2 wheels), Regular Toilet Toileting- Clothing Manipulation and Hygiene: Minimal assistance Functional mobility during ADLs: Contact guard assist, Rolling walker (2 wheels)   Cognition: Cognition Orientation Level: Oriented X4 Cognition Arousal: Alert Behavior During Therapy: WFL for tasks assessed/performed   Physical Exam: Blood pressure (!) 162/78, pulse 85, temperature (!) 97.5 F (36.4 C), temperature source Oral, resp. rate 20, height 5' 11 (1.803 m), weight 63 kg, SpO2 95%.  General: No apparent distress HEENT: Head is normocephalic, atraumatic, sclera anicteric, oral mucosa pink and moist Neck: Supple  Heart: Reg rate and rhythm. No murmurs rubs or gallops Chest: CTA bilaterally without wheezes, rales, or rhonchi; no distress Abdomen: Soft, non-tender, non-distended, bowel sounds positive. Extremities: No clubbing, cyanosis, or edema.  Psych: Pt's affect is appropriate. Pt is cooperative Skin: sternal and abdominal incisions CDI, bruising in UEs Neuro:     Mental Status: AAOx4, memory intact, Good insight and awareness Speech/Languate: Fluent, follows simple commands CRANIAL NERVES: II: PERRL. Visual fields full III, IV, VI: EOM intact, no gaze preference or deviation V: normal sensation bilaterally VII: no asymmetry VIII: normal hearing to speech IX, X: normal palatal elevation XI: 5/5 head turn and 5/5 shoulder shrug bilaterally XII: Tongue midline     MOTOR: RUE: at least 4/5 LUE:at least 4/5  RLE: HF 5/5, KE 5/5, ADF 5/5, APF 5/5 LLE: HF 5/5, KE 5/5, ADF 5/5, APF 5/5 Sternal precautions limits UE exam   REFLEXES: hyporeflexive b/l LE   SENSORY: Normal to touch all 4 extremities   Coordination: Normal finger to nose b/l, no tremor     Lab Results Last 48 Hours        Results for orders placed or  performed during the hospital encounter of 11/01/23 (from the past 48 hours)  Glucose, capillary     Status: Abnormal    Collection Time: 11/13/23  4:08 PM  Result Value Ref Range    Glucose-Capillary 166 (H) 70 - 99 mg/dL      Comment: Glucose reference range applies only to samples taken after fasting for at least 8 hours.  Basic metabolic panel with GFR     Status: Abnormal    Collection Time: 11/14/23  4:12 AM  Result Value Ref Range    Sodium 139 135 - 145 mmol/L    Potassium 4.2 3.5 - 5.1 mmol/L    Chloride 105 98 - 111 mmol/L    CO2 22 22 - 32 mmol/L    Glucose, Bld 142 (H) 70 - 99 mg/dL      Comment: Glucose reference range applies only to samples taken after fasting for at least 8 hours.    BUN 40 (H) 8 - 23 mg/dL    Creatinine, Ser 7.90 (H) 0.61 - 1.24 mg/dL    Calcium   7.6 (L) 8.9 - 10.3 mg/dL    GFR, Estimated 32 (L) >60 mL/min      Comment: (NOTE) Calculated using the CKD-EPI Creatinine Equation (2021)      Anion gap 12 5 - 15      Comment: Performed at [facility], [address], [place], [place] [address]  Magnesium      Status: None    Collection Time: 11/14/23  4:12 AM  Result Value Ref Range    Magnesium  2.0 1.7 - 2.4 mg/dL      Comment: Performed at [facility], [address], [place], [place] [address]  CBC     Status: Abnormal    Collection Time: 11/14/23  4:12 AM  Result Value Ref Range    WBC 5.2 4.0 - 10.5 K/uL    RBC 3.48 (L) 4.22 - 5.81 MIL/uL    Hemoglobin 10.2 (L) 13.0 - 17.0 g/dL    HCT 68.8 (L) 60.9 - 52.0 %    MCV 89.4 80.0 - 100.0 fL    MCH 29.3 26.0 - 34.0 pg    MCHC 32.8 30.0 - 36.0 g/dL    RDW 82.9 (H) 88.4 - 15.5 %    Platelets 104 (L) 150 - 400 K/uL    nRBC 0.0 0.0 - 0.2 %      Comment: Performed at [facility], [address] [place], [place] [address]  Glucose, capillary     Status: Abnormal    Collection Time: 11/14/23  7:38 AM  Result Value Ref Range    Glucose-Capillary 135 (H) 70 - 99 mg/dL      Comment:  Glucose reference range applies only to samples taken after fasting for at least 8 hours.  Glucose, capillary     Status: Abnormal    Collection Time: 11/14/23 12:08 PM  Result Value Ref Range    Glucose-Capillary 177 (H) 70 - 99 mg/dL      Comment: Glucose reference range applies only to samples taken after fasting for at least 8 hours.  Glucose, capillary     Status: Abnormal    Collection Time: 11/14/23  5:05 PM  Result Value Ref Range    Glucose-Capillary 180 (H) 70 - 99 mg/dL      Comment: Glucose reference range applies only to samples taken after fasting for at least 8 hours.  Glucose, capillary     Status: Abnormal    Collection Time: 11/14/23  9:06 PM  Result Value Ref Range    Glucose-Capillary 169 (H) 70 - 99 mg/dL      Comment: Glucose reference range applies only to samples taken after fasting for at least 8 hours.    Comment 1 Notify RN      Comment 2 Document in Chart    Basic metabolic panel with GFR     Status: Abnormal    Collection Time: 11/15/23  4:08 AM  Result Value Ref Range    Sodium 139 135 - 145 mmol/L    Potassium 3.7 3.5 - 5.1 mmol/L    Chloride 107 98 - 111 mmol/L    CO2 22 22 - 32 mmol/L    Glucose, Bld 152 (H) 70 - 99 mg/dL      Comment: Glucose reference range applies only to samples taken after fasting for at least 8 hours.    BUN 37 (H) 8 - 23 mg/dL    Creatinine, Ser 8.09 (H) 0.61 - 1.24 mg/dL    Calcium  7.7 (  L) 8.9 - 10.3 mg/dL    GFR, Estimated 36 (L) >60 mL/min      Comment: (NOTE) Calculated using the CKD-EPI Creatinine Equation (2021)      Anion gap 10 5 - 15      Comment: Performed at [facility], [address] [place], [place] [address]  Glucose, capillary     Status: Abnormal    Collection Time: 11/15/23  6:13 AM  Result Value Ref Range    Glucose-Capillary 133 (H) 70 - 99 mg/dL      Comment: Glucose reference range applies only to samples taken after fasting for at least 8 hours.    Comment 1 Notify RN      Comment 2  Document in Chart    Glucose, capillary     Status: Abnormal    Collection Time: 11/15/23 11:16 AM  Result Value Ref Range    Glucose-Capillary 223 (H) 70 - 99 mg/dL      Comment: Glucose reference range applies only to samples taken after fasting for at least 8 hours.       Imaging Results (Last 48 hours)  DG Chest 2 View Result Date: 11/14/2023 CLINICAL DATA:  Status post cardiac surgery. EXAM: CHEST - 2 VIEW COMPARISON:  11/13/2023 FINDINGS: Lungs are hyperexpanded. No pneumothorax or substantial pleural effusion. No focal consolidation or evidence of pulmonary edema. The cardiopericardial silhouette is within normal limits for size. Right PICC line tip overlies the mid to lower SVC level. Telemetry leads overlie the chest. IMPRESSION: Hyperexpansion without acute cardiopulmonary findings. Electronically Signed   By: Camellia Candle M.D.   On: 11/14/2023 05:44           Blood pressure (!) 162/78, pulse 85, temperature (!) 97.5 F (36.4 C), temperature source Oral, resp. rate 20, height 5' 11 (1.803 m), weight 63 kg, SpO2 95%.   Medical Problem List and Plan: 1. Functional deficits secondary to debility due to severe aortic insufficiency s/p AVR. Pt with postoperative seizures and encephalopathy.  Sternal precautions.              -patient may shower, cover incisions please             -ELOS/Goals: 7 days, modI with PT/OT             -Admit to CIR 2.  Antithrombotics: -DVT/anticoagulation:  Mechanical: Sequential compression devices, below knee Bilateral lower extremities Pharmaceutical: Lovenox  and Heparin              -antiplatelet therapy: Asprin  3. Pain Management: Oxycodone  prn--Headache: Imitrex  prn  4. Mood/Behavior/Sleep: LCSW to follow for evaluation and support when available.              -antipsychotic agents: n/a             -insomnia: melatonin 3 mg at bedtime, Trazodone PRN 5. Neuropsych/cognition: This patient is capable of making decisions on his own behalf. 6.  Skin/Wound Care: Sternal Precautions- monitor chest tube sites and incisions for s/s of infection. 7. Fluids/Electrolytes/Nutrition: Strict I&O with daily weights.              -Carb modified + ensure supplement and multivitamin              -GERD: PPI 8. Acute on chronic diastolic HF: Lasix  40 mg daily.  Spironolactone  and Entresto  not resumed d/t creatinine.  Daily weights 9. AKI CKD IIIb: (Baseline 1.7-2) now 1.9. Monitor CMP  - Lasix  40 mg daily has been  restarted 10. A. Fib: post op issue- now on Amiodarone 400 mg bid, Lopressor  50 mg bid and Amlodipine  10 mg. Monitor and supplement potassium as needed.  11. ABLA: post op acute anemia- H&H stable 10.2/31.1. Monitor CBC  12. T2DM: Hemoglobin A1c 7.7 11/12/23             -monitor CBGs ac/hs with SSI. Continue Jardiance   13. Hx of seizures: Keppra 500 mg bid  14. Thrombocytopenia- monitor platelets currently 104k  15. COPD: stable on RA.  Duo neb prn and incentive spirometer  16. Constipation: Colace and Miralax  daily  17. HLD: Crestor  40 mg  18. RLS: Requip  0.25 mg prn       Daphne LOISE Satterfield, NP 11/15/2023   I have personally performed a face to face diagnostic evaluation of this patient and formulated the key components of the plan.  Additionally, I have personally reviewed laboratory data, imaging studies, as well as relevant notes and concur with the physician assistant's documentation above.   The patient's status has not changed from the original H&P.  Any changes in documentation from the acute care chart have been noted above.   Murray Collier, MD

## 2023-11-16 LAB — CBC WITH DIFFERENTIAL/PLATELET
Abs Immature Granulocytes: 0.06 K/uL (ref 0.00–0.07)
Basophils Absolute: 0 K/uL (ref 0.0–0.1)
Basophils Relative: 0 %
Eosinophils Absolute: 0 K/uL (ref 0.0–0.5)
Eosinophils Relative: 0 %
HCT: 36.1 % — ABNORMAL LOW (ref 39.0–52.0)
Hemoglobin: 11.6 g/dL — ABNORMAL LOW (ref 13.0–17.0)
Immature Granulocytes: 1 %
Lymphocytes Relative: 16 %
Lymphs Abs: 1 K/uL (ref 0.7–4.0)
MCH: 29 pg (ref 26.0–34.0)
MCHC: 32.1 g/dL (ref 30.0–36.0)
MCV: 90.3 fL (ref 80.0–100.0)
Monocytes Absolute: 0.5 K/uL (ref 0.1–1.0)
Monocytes Relative: 8 %
Neutro Abs: 4.8 K/uL (ref 1.7–7.7)
Neutrophils Relative %: 75 %
Platelets: 164 K/uL (ref 150–400)
RBC: 4 MIL/uL — ABNORMAL LOW (ref 4.22–5.81)
RDW: 16.7 % — ABNORMAL HIGH (ref 11.5–15.5)
WBC: 6.5 K/uL (ref 4.0–10.5)
nRBC: 0 % (ref 0.0–0.2)

## 2023-11-16 LAB — COMPREHENSIVE METABOLIC PANEL WITH GFR
ALT: 34 U/L (ref 0–44)
AST: 101 U/L — ABNORMAL HIGH (ref 15–41)
Albumin: 2.8 g/dL — ABNORMAL LOW (ref 3.5–5.0)
Alkaline Phosphatase: 98 U/L (ref 38–126)
Anion gap: 12 (ref 5–15)
BUN: 31 mg/dL — ABNORMAL HIGH (ref 8–23)
CO2: 22 mmol/L (ref 22–32)
Calcium: 8.2 mg/dL — ABNORMAL LOW (ref 8.9–10.3)
Chloride: 105 mmol/L (ref 98–111)
Creatinine, Ser: 1.58 mg/dL — ABNORMAL HIGH (ref 0.61–1.24)
GFR, Estimated: 44 mL/min — ABNORMAL LOW (ref >60)
Glucose, Bld: 158 mg/dL — ABNORMAL HIGH (ref 70–99)
Potassium: 3.5 mmol/L (ref 3.5–5.1)
Sodium: 139 mmol/L (ref 135–145)
Total Bilirubin: 0.8 mg/dL (ref 0.0–1.2)
Total Protein: 6.3 g/dL — ABNORMAL LOW (ref 6.5–8.1)

## 2023-11-16 LAB — GLUCOSE, CAPILLARY
Glucose-Capillary: 154 mg/dL — ABNORMAL HIGH (ref 70–99)
Glucose-Capillary: 178 mg/dL — ABNORMAL HIGH (ref 70–99)
Glucose-Capillary: 227 mg/dL — ABNORMAL HIGH (ref 70–99)
Glucose-Capillary: 206 mg/dL — ABNORMAL HIGH (ref 70–99)

## 2023-11-16 LAB — MAGNESIUM: Magnesium: 2.1 mg/dL (ref 1.7–2.4)

## 2023-11-16 LAB — VITAMIN D 25 HYDROXY (VIT D DEFICIENCY, FRACTURES): Vit D, 25-Hydroxy: 16.82 ng/mL — ABNORMAL LOW (ref 30–100)

## 2023-11-16 MED ORDER — L-METHYLFOLATE-B6-B12 3-35-2 MG PO TABS
1.0000 | ORAL_TABLET | Freq: Every day | ORAL | Status: DC
  Administered 2023-11-16 – 2023-11-21 (×6): 1 via ORAL
  Filled 2023-11-16 (×6): qty 1

## 2023-11-16 MED ORDER — MAGNESIUM GLUCONATE 500 (27 MG) MG PO TABS
250.0000 mg | ORAL_TABLET | Freq: Every day | ORAL | Status: DC
  Administered 2023-11-16 – 2023-11-20 (×5): 250 mg via ORAL
  Filled 2023-11-16 (×5): qty 1

## 2023-11-16 MED ORDER — INSULIN GLARGINE-YFGN 100 UNIT/ML ~~LOC~~ SOLN
6.0000 [IU] | Freq: Every day | SUBCUTANEOUS | Status: DC
  Administered 2023-11-16 – 2023-11-21 (×6): 6 [IU] via SUBCUTANEOUS
  Filled 2023-11-16 (×6): qty 0.06

## 2023-11-16 MED ORDER — VITAMIN D 25 MCG (1000 UNIT) PO TABS
1000.0000 [IU] | ORAL_TABLET | Freq: Every day | ORAL | Status: DC
  Administered 2023-11-16: 1000 [IU] via ORAL
  Filled 2023-11-16: qty 1

## 2023-11-16 MED ORDER — B COMPLEX-C PO TABS
1.0000 | ORAL_TABLET | Freq: Every day | ORAL | Status: DC
  Administered 2023-11-16 – 2023-11-21 (×6): 1 via ORAL
  Filled 2023-11-16 (×6): qty 1

## 2023-11-16 MED FILL — Heparin Sodium (Porcine) Inj 1000 Unit/ML: Qty: 1000 | Status: AC

## 2023-11-16 MED FILL — Lidocaine HCl Local Preservative Free (PF) Inj 2%: INTRAMUSCULAR | Qty: 14 | Status: AC

## 2023-11-16 MED FILL — Potassium Chloride Inj 2 mEq/ML: INTRAVENOUS | Qty: 40 | Status: AC

## 2023-11-16 NOTE — Progress Notes (Signed)
 Inpatient Rehabilitation Admission Medication Review by a Pharmacist  A complete drug regimen review was completed for this patient to identify any potential clinically significant medication issues.  High Risk Drug Classes Is patient taking? Indication by Medication  Antipsychotic Yes Compazine -N/V  Anticoagulant Yes Heparin  SQ-VTE px  Antibiotic No   Opioid Yes Oxycodone -pain  Antiplatelet Yes Aspirin-CAD  Hypoglycemics/insulin  Yes Novolog  SSI, Insulin  glargine, empagliflozin -DM  Vasoactive Medication Yes Amlodipine -HTN Metoprolol -Afib, CAD, HF Furosemide -HF Amiodarone-Afib  Chemotherapy No   Other Yes Acetaminophen -pain Maalox, pantoprazole -GERD Bisacodyl , Miralax ,Fleets, Docusate- Constipation Benadryl -itching Guaifenesin/DM-cough Duoneb-COPD Keppra-seizure d/o Melatonin. Trazodone-sleep Sumatriptan -headache Rosuvastatin -HLD Requip -RLS MVI-supplementation     Type of Medication Issue Identified Description of Issue Recommendation(s)  Drug Interaction(s) (clinically significant)     Duplicate Therapy     Allergy     No Medication Administration End Date     Incorrect Dose     Additional Drug Therapy Needed  Entresto /Aldactone  on hold for elevated Scr during acute admit Recommend restart for HF when/if appropriate  Significant med changes from prior encounter (inform family/care partners about these prior to discharge). Patient on Semeglutide and Elavil  PTA Restart at discharge from CIR when appropriate.  Other       Clinically significant medication issues were identified that warrant physician communication and completion of prescribed/recommended actions by midnight of the next day:  No  Name of provider notified for urgent issues identified:   Provider Method of Notification:     Pharmacist comments:   Time spent performing this drug regimen review (minutes):  20  Claudie Brickhouse A. Lyle, PharmD, BCPS, FNKF Clinical Pharmacist [place] Please utilize  Amion for appropriate phone number to reach the unit pharmacist [facility])  11/16/2023 7:13 AM

## 2023-11-16 NOTE — Evaluation (Addendum)
 Occupational Therapy Assessment and Plan  Patient Details  Name: Michael Bender MRN: [id number] Date of Birth: 03/11/1945  OT Diagnosis: abnormal posture and muscle weakness (generalized) Rehab Potential: Rehab Potential (ACUTE ONLY): Fair ELOS: 7 days   Today's Date: 11/16/2023 OT Individual Time: 9098-9041 OT Individual Time Calculation (min): 57 min     Hospital Problem: Principal Problem:   Debility   Past Medical History:  Past Medical History:  Diagnosis Date   Acute combined systolic and diastolic heart failure (HCC) 04/22/2023   Acute on chronic diastolic CHF (congestive heart failure) (HCC) 04/20/2023   AKI (acute kidney injury) 10/28/2022   Arthritis    Back   Balance problems 02/21/2023   Blood transfusion    as a child   Cachexia 05/03/2022   Added based on temporal wasting noted on exam, Body mass index is 19.92 kg/m. At 05/03/2022 encounter     Cervical discitis 11/10/2022   Chronic back pain    COPD (chronic obstructive pulmonary disease) (HCC)    Disturbance of skin sensation 05/22/2018   Effusion of right knee 02/21/2023   Effusion of right knee joint 01/16/2023   Elevated troponin 04/22/2023   Essential hypertension 03/05/2007   Qualifier: Diagnosis of   By: Krystal MD, Reyes LABOR      Finger pain, left 11/08/2022   Gastric erosion    Gastritis and gastroduodenitis    Gastrointestinal hemorrhage with melena 11/20/2018   GIB (gastrointestinal bleeding) 07/18/2019   Hardware complicating wound infection 11/10/2022   Headache(784.0)    last one 6 months ago   Hemorrhoids, internal    High risk medication use 03/03/2022   History of duodenal ulcer    History of fusion of cervical spine 03/03/2022   With persistent cervical pain and palpable screws  Led to disability  History of attempt to dig furrow in skull to fix it  Last surgery 1992     History of lumbar fusion 03/03/2022   RE-OPERATIVE DIAGNOSIS:  lumbar stenosis synovial cyst lumbar spondylosis  spondylolisthesis lumbar radiculoapthy L4/5   PROCEDURE:  Procedure(s): POSTERIOR LUMBAR FUSION 1 LEVEL with resection of synovial cyst     History of upper gastrointestinal bleeding 03/03/2022   Duodenal ulcer 2021 Dr. Avram   HLD (hyperlipidemia)    Hypertension    Hypokalemia 04/22/2023   Hypomagnesemia 04/22/2023   Infected blister of left index finger 11/07/2022   Intractable pain 03/03/2022   Loss of weight    Wt Readings from Last 10 Encounters:  04/05/22  146 lb (66.2 kg)  03/03/22  143 lb 9.6 oz (65.1 kg)  07/14/21  153 lb (69.4 kg)  12/14/20  147 lb 12.8 oz (67 kg)  11/13/19  154 lb (69.9 kg)  09/16/19  146 lb (66.2 kg)  08/06/19  146 lb (66.2 kg)  07/19/19  144 lb 13.5 oz (65.7 kg)  07/17/19  148 lb (67.1 kg)  07/09/19  147 lb 9.6 oz (67 kg)         MSSA bacteremia 11/07/2022   Neck rigidity    post cervical fusion   Nocturia    PAIN, CHRONIC NEC 10/06/2006   Qualifier: Diagnosis of   By: Krystal RN, Leeroy       Pneumonia    Prostate disease    Right sided temporal headache 05/22/2018   S/P cervical spinal fusion 03/03/2022   With persistent cervical pain and palpable screws Led to disability History of attempt to dig furrow in skull to fix it Last surgery  1992   Senile ecchymosis 01/21/2019   Septic arthritis of wrist, left (HCC) 11/08/2022   Septic infrapatellar bursitis of right knee 11/07/2022   Staphylococcal Arthritis of Right Knee (Updated 01/30/2023) MRI 01/17/2023 shows worsening findings:  Worsening tear of posterior horn medial meniscus with large radial component Worsening subcortical stress fracture/osteochondral lesion of medial femoral condyle New subcortical stress fracture/osteochondral lesion of medial tibial plateau Moderate-to-large effusion with worsened synovitis    Staphylococcal arthritis of left wrist (HCC) 11/07/2022   Staphylococcal arthritis of right knee (HCC) 11/08/2022   Syncope 11/05/2022   Underweight on examination 05/03/2022    Past Surgical History:  Past Surgical History:  Procedure Laterality Date   AORTIC VALVE REPLACEMENT N/A 11/08/2023   Procedure: AORTIC VALVE REPLACEMENT USING A INSPIRIS VALVE.;  Surgeon: [name], MD;  Location: [facility] OR;  Service: Open Heart Surgery;  Laterality: N/A;   BIOPSY  07/19/2019   Procedure: BIOPSY;  Surgeon: San Sandor GAILS, DO;  Location: [facility];  Service: Gastroenterology;;   CERVICAL FUSION  1992   C2/C 3  four surgeries   CLIPPING OF ATRIAL APPENDAGE N/A 11/08/2023   Procedure: CLIPPING OF LEFT ATRIAL APPENDAGE USING A CLIP;  Surgeon: [name], MD;  Location: [facility] OR;  Service: Open Heart Surgery;  Laterality: N/A;   COLONOSCOPY     ESOPHAGOGASTRODUODENOSCOPY  07/20/2011   Procedure: ESOPHAGOGASTRODUODENOSCOPY (EGD);  Surgeon: Lupita FORBES Commander, MD;  Location: [facility];  Service: Endoscopy;  Laterality: N/A;   ESOPHAGOGASTRODUODENOSCOPY (EGD) WITH PROPOFOL  N/A 07/19/2019   Procedure: ESOPHAGOGASTRODUODENOSCOPY (EGD) WITH PROPOFOL ;  Surgeon: San Sandor GAILS, DO;  Location: [facility];  Service: Gastroenterology;  Laterality: N/A;   I & D EXTREMITY Left 11/09/2022   Procedure: IRRIGATION AND DEBRIDEMENT LEFT WRIST;  Surgeon: Arlinda Buster, MD;  Location: [facility] OR;  Service: Orthopedics;  Laterality: Left;   INTRAOPERATIVE TRANSESOPHAGEAL ECHOCARDIOGRAM N/A 11/08/2023   Procedure: ECHOCARDIOGRAM, TRANSESOPHAGEAL, INTRAOPERATIVE;  Surgeon: [name], MD;  Location: [facility] OR;  Service: Open Heart Surgery;  Laterality: N/A;   RIGHT/LEFT HEART CATH AND CORONARY ANGIOGRAPHY N/A 11/06/2023   Procedure: RIGHT/LEFT HEART CATH AND CORONARY ANGIOGRAPHY;  Surgeon: Wonda Sharper, MD;  Location: [facility] INVASIVE CV LAB;  Service: Cardiovascular;  Laterality: N/A;   SAVORY DILATION  07/20/2011   Procedure: SAVORY DILATION;  Surgeon: Lupita FORBES Commander, MD;  Location: [facility];  Service: Endoscopy;  Laterality: N/A;   SPINAL FUSION  05/06/11   TRANSESOPHAGEAL ECHOCARDIOGRAM  (CATH LAB) N/A 07/24/2023   Procedure: TRANSESOPHAGEAL ECHOCARDIOGRAM;  Surgeon: Pietro Redell RAMAN, MD;  Location: [facility] INVASIVE CV LAB;  Service: Cardiovascular;  Laterality: N/A;    Assessment & Plan Clinical Impression: Michael Bender is a 78 year old male with PMHx of hypertension, hyperlipidemia, uncontrolled T2DM (last HgbA1C 8.4), paroxysmal atrial fibrillation not on anticoagulation due to falls, BPH, history of GI bleed due to duodenal ulcer, AKI on CKD stage IIIb, peripheral arterial disease, chronic iron deficiency anemia, prior tobacco abuse quit 5 years ago, COPD, combined systolic and diastolic heart failure, and degenerative spine disease S/P multiple surgeries.  He was seen by cardiothoracic surgery on 08/30/2023 with plan for open surgical aortic valve replacement as patient was not a candidate for TAVR.  He [facility] to [facility]23 with complaints of exertional dyspnea barely able to walk 5 feet without shortness of breath and overall weakness.  ED labs: Sodium bicarb 18, BUN 48, serum creatinine 3.0, proBNP 634, and troponin 67> 62 likely due to  demand ischemia. Abnormal chest x-ray showing possible small nodular opacity over the right mid to lower lung with recommended chest x-ray in 3 months along with noncontrast chest CT.  EKG negative for acute acute ischemia and showed NSR with prolonged QTc.  Blood cultures were collected and showed no growth.  An echo revealed LVEF 60 to 65% and mild aortic regurgitation but the aortic valve is not well-visualized.  Cardiac MRI suggestive of severe AI.  The patient underwent an aortic valve replacement with a 21 mm Edwards Inspiris bioprosthetic valve on 11/08/2023 by Dr. Con Clunes.  Following the procedure, patient was admitted to surgical ICU and was hemodynamically stable. He was weaned from the ventilator per protocol but noted to have decreased consciousness, disorientation and not following commands. Hospital course  complicated by right facial droop along with bilateral arm and lower extremity weakness and right side neglect. CT scan head/neck was negative for acute findings. Neurology consulted and he was loaded with Keppra for suspected seizure activity. An EEG revealed no seizures or epileptiform discharges. There was evidence of generalized cerebral dysfunction/encephalopathy felt to be related to sedation. Neurology recommended MRI of brain on 10/10/205 that showed moderately severe chronic small vessel ischemic disease but not acute abnormality.  The patient developed atrial fibrillation with RVR and was started on IV amiodarone. AKI on CKD stage 3b, creatinine is not improved. He was maintained on Heparin  for DVT prophylaxis. Pt reports poor sleep at night. Constipation treated with laxatives with improvement.  Per chart review the patient currently lives alone in a 1 level home and was able to complete ADLs with frequent rest/sit breaks. He currently requires Min A to CGA with mobility and basic ADLs. Therapy evaluations completed due to patient decreased functional mobility was admitted for a comprehensive rehab program.  Patient transferred to CIR on 11/15/2023 .    Patient currently requires min with basic self-care skills and IADL secondary to muscle weakness, decreased cardiorespiratoy endurance, and decreased sitting balance, decreased standing balance, and difficulty maintaining precautions.  Prior to hospitalization, patient could complete bathing, dressing, pet care, cooking meals, laundry with modified independent . Pt completed functional transfers with a walking stick.  Patient will benefit from skilled intervention to increase independence with basic self-care skills prior to discharge home independently. Pt's sons available PRN, currently assisting with transportation and medication management. Anticipate patient will require intermittent supervision and follow up outpatient.  OT - End of  Session Activity Tolerance: Tolerates 30+ min activity with multiple rests;Improving Endurance Deficit: Yes Endurance Deficit Description: required rest breaks throughout due to mild SOB OT Assessment Rehab Potential (ACUTE ONLY): Fair OT Barriers to Discharge: None OT Patient demonstrates impairments in the following area(s): Balance;Behavior;Endurance;Safety OT Basic ADL's Functional Problem(s): Bathing;Dressing;Toileting OT Advanced ADL's Functional Problem(s): Simple Meal Preparation;Laundry OT Transfers Functional Problem(s): Toilet;Tub/Shower OT Additional Impairment(s): None OT Plan OT Frequency: 5 out of 7 days OT Intensity: Minimum of 1-2 x/day, 45 to 90 minutes  OT Duration/Estimated Length of Stay: 7 days OT Treatment/Interventions: Balance/vestibular training;Discharge planning;Pain management;Self Care/advanced ADL retraining;Therapeutic Activities;Therapeutic Exercise;Functional mobility training;Disease mangement/prevention;Patient/family education;DME/adaptive equipment instruction;UE/LE Strength taining/ROM;Community reintegration;Psychosocial support;Wheelchair propulsion/positioning OT Self Feeding Anticipated Outcome(s): Mod-I OT Basic Self-Care Anticipated Outcome(s): Mod-I OT Toileting Anticipated Outcome(s): Mod-I OT Bathroom Transfers Anticipated Outcome(s): Mod-I OT Recommendation Recommendations for Other Services: Therapeutic Recreation consult Patient destination: Home Follow Up Recommendations: Outpatient OT Equipment Recommended: Tub/shower seat  OT Evaluation Precautions/Restrictions  Precautions Precautions: Fall;Sternal Precaution Booklet Issued: Yes (comment) Recall of Precautions/Restrictions: Impaired Restrictions Weight Bearing  Restrictions Per Provider Order: No Other Position/Activity Restrictions: Sternal  Pain Pain Assessment Pain Scale: 0-10 Pain Score: 0-No pain Home Living/Prior Functioning Home Living Family/patient expects to be  discharged to:: Private residence Living Arrangements: Alone Available Help at Discharge: Family, Available PRN/intermittently Type of Home: House Home Access: Level entry Home Layout: One level Bathroom Shower/Tub: Engineer, manufacturing systems: Standard Bathroom Accessibility: Yes Additional Comments: has RW and walking sticks; was using walking sticks  Lives With: Alone IADL History Homemaking Responsibilities: Yes Meal Prep Responsibility: Primary Laundry Responsibility: Primary Cleaning Responsibility: Primary Bill Paying/Finance Responsibility: Primary Shopping Responsibility: Primary Child Care Responsibility: No Current License: No Mode of Transportation: Other (comment) (Truck) Occupation: Retired Leisure and Hobbies: fishing Prior Function Level of Independence: Requires assistive device for independence, Independent with basic ADLs, Independent with transfers, Independent with homemaking with ambulation, Independent with gait  Able to Take Stairs?: Yes Driving: No Vocation: Retired Administrator, sports Baseline Vision/History: 1 Wears glasses (readers) Ability to See in Adequate Light: 0 Adequate Patient Visual Report: No change from baseline Vision Assessment?: Wears glasses for reading Perception  Perception: Within Functional Limits Praxis Praxis: WFL Cognition Cognition Overall Cognitive Status: Within Functional Limits for tasks assessed Arousal/Alertness: Awake/alert Orientation Level: Person;Place;Situation Person: Oriented Place: Oriented Situation: Oriented Memory: Appears intact Awareness: Appears intact Problem Solving: Appears intact Safety/Judgment: Appears intact Brief Interview for Mental Status (BIMS) Repetition of Three Words (First Attempt): 3 Temporal Orientation: Year: Correct Temporal Orientation: Month: Accurate within 5 days Temporal Orientation: Day: Correct Recall: Sock: Yes, no cue required Recall: Blue: Yes, no cue required Recall:  Bed: Yes, no cue required BIMS Summary Score: 15 Sensation Sensation Light Touch: Impaired Detail Light Touch Impaired Details: Impaired RLE Hot/Cold: Not tested Proprioception: Appears Intact Stereognosis: Not tested Additional Comments: decreased sensation along R great toe and R shin Coordination Gross Motor Movements are Fluid and Coordinated: Yes Fine Motor Movements are Fluid and Coordinated: Yes Coordination and Movement Description: generalized weakness and deconditioning Finger Nose Finger Test: [facility] bilaterally Motor  Motor Motor: Within Functional Limits  Trunk/Postural Assessment  Cervical Assessment Cervical Assessment: Exceptions to [facility] (forward head) Thoracic Assessment Thoracic Assessment: Exceptions to [facility] (thoracic rounding) Lumbar Assessment Lumbar Assessment: Exceptions to [facility] (posterior pelvic tilt) Postural Control Postural Control: Within Functional Limits  Balance Balance Balance Assessed: Yes Static Sitting Balance Static Sitting - Balance Support: No upper extremity supported Static Sitting - Level of Assistance: 6: Modified independent (Device/Increase time) Dynamic Sitting Balance Dynamic Sitting - Balance Support: Feet supported;No upper extremity supported Dynamic Sitting - Level of Assistance: 6: Modified independent (Device/Increase time) Static Standing Balance Static Standing - Balance Support: No upper extremity supported;During functional activity Static Standing - Level of Assistance: 5: Stand by assistance (CGA) Dynamic Standing Balance Dynamic Standing - Balance Support: No upper extremity supported;During functional activity Dynamic Standing - Level of Assistance: 4: Min assist Dynamic Standing - Comments: with transfers and ambulation Extremity/Trunk Assessment RUE Assessment RUE Assessment: Within Functional Limits LUE Assessment LUE Assessment: Within Functional Limits  Care Tool Care Tool Self Care Eating   Eating Assist  Level: Set up assist    Oral Care    Oral Care Assist Level: Independent    Bathing   Body parts bathed by patient: Right arm;Left arm;Abdomen;Front perineal area;Buttocks;Right upper leg;Left upper leg;Right lower leg;Left lower leg;Face   Body parts n/a: Chest (sternal incision) Assist Level: Contact Guard/Touching assist    Upper Body Dressing(including orthotics)   What is the patient wearing?: Pull over shirt  Assist Level: Supervision/Verbal cueing    Lower Body Dressing (excluding footwear)   What is the patient wearing?: Pants;Underwear/pull up Assist for lower body dressing: Contact Guard/Touching assist    Putting on/Taking off footwear   What is the patient wearing?: Non-skid slipper socks Assist for footwear: Minimal Assistance - Patient > 75%       Care Tool Toileting Toileting activity   Assist for toileting: Contact Guard/Touching assist     Care Tool Bed Mobility Roll left and right activity   Roll left and right assist level: Supervision/Verbal cueing    Sit to lying activity   Sit to lying assist level: Supervision/Verbal cueing    Lying to sitting on side of bed activity   Lying to sitting on side of bed assist level: the ability to move from lying on the back to sitting on the side of the bed with no back support.: Supervision/Verbal cueing     Care Tool Transfers Sit to stand transfer   Sit to stand assist level: Contact Guard/Touching assist    Chair/bed transfer   Chair/bed transfer assist level: Minimal Assistance - Patient > 75%     Toilet transfer   Assist Level: Contact Guard/Touching assist     Care Tool Cognition  Expression of Ideas and Wants Expression of Ideas and Wants: 4. Without difficulty (complex and basic) - expresses complex messages without difficulty and with speech that is clear and easy to understand  Understanding Verbal and Non-Verbal Content Understanding Verbal and Non-Verbal Content: 4. Understands (complex and  basic) - clear comprehension without cues or repetitions   Memory/Recall Ability Memory/Recall Ability : Current season;Location of own room;That he or she is in a hospital/hospital unit   Refer to Care Plan for Long Term Goals  SHORT TERM GOAL WEEK 1 OT Short Term Goal 1 (Week 1): LTG=STG d/t ELOS  Recommendations for other services: Therapeutic Recreation  Pet therapy and Outing/community reintegration   Skilled Therapeutic Intervention 1:1 evaluation and treatment session initiated this date. OT roles, goals and purpose discussed with pt as well as therapy schedule. ADL completed this date with levels of assist listed above. Pt displaying difficulty with adherence to sternal precautions, OT directed pt to sternal precautions sheet in pt binder and reviewed, pt will benefit with continued reinforcements. Pt currently displaying endurance deficit and requires increased rest breaks with activity.  Pt would benefit from skilled OT in IPR setting in order to maximize independence with ADLs upon D/C.   ADL ADL Eating: Independent Where Assessed-Eating: Bed level Grooming: Supervision/safety Where Assessed-Grooming: Wheelchair;Sitting at sink Upper Body Bathing: Contact guard Where Assessed-Upper Body Bathing: Sitting at sink;Wheelchair Lower Body Bathing: Contact guard Where Assessed-Lower Body Bathing: Sitting at sink;Wheelchair Upper Body Dressing: Minimal assistance Where Assessed-Upper Body Dressing: Sitting at sink;Wheelchair Lower Body Dressing: Contact guard Where Assessed-Lower Body Dressing: Sitting at sink;Wheelchair Toileting: Contact guard Where Assessed-Toileting: Teacher, adult education: Furniture conservator/restorer Method: Event organiser: Close supervision Film/video editor Method: Ambulating Mobility  Bed Mobility Bed Mobility: Rolling Right;Rolling Left;Sit to Supine;Supine to Sit Rolling Right: Supervision/verbal cueing Rolling Left:  Supervision/Verbal cueing Supine to Sit: Supervision/Verbal cueing Sit to Supine: Supervision/Verbal cueing Transfers Sit to Stand: Contact Guard/Touching assist Stand to Sit: Contact Guard/Touching assist   Discharge Criteria: Patient will be discharged from OT if patient refuses treatment 3 consecutive times without medical reason, if treatment goals not met, if there is a change in medical status, if patient makes no progress towards goals or  if patient is discharged from hospital.  The above assessment, treatment plan, treatment alternatives and goals were discussed and mutually agreed upon: by patient  Gerome Kokesh Woods-Chance, MS, OTR/L 11/16/2023, 12:30 PM

## 2023-11-16 NOTE — Evaluation (Signed)
 Physical Therapy Assessment and Plan  Patient Details  Name: Michael Bender MRN: [id number] Date of Birth: 1945-05-09  PT Diagnosis: Abnormal posture, Abnormality of gait, Coordination disorder, Difficulty walking, Edema, Impaired sensation, and Muscle weakness Rehab Potential: Good ELOS: 5-7 days   Today's Date: 11/16/2023 PT Individual Time: 8954-8844 PT Individual Time Calculation (min): 70 min    Hospital Problem: Principal Problem:   Debility   Past Medical History:  Past Medical History:  Diagnosis Date   Acute combined systolic and diastolic heart failure (HCC) 04/22/2023   Acute on chronic diastolic CHF (congestive heart failure) (HCC) 04/20/2023   AKI (acute kidney injury) 10/28/2022   Arthritis    Back   Balance problems 02/21/2023   Blood transfusion    as a child   Cachexia 05/03/2022   Added based on temporal wasting noted on exam, Body mass index is 19.92 kg/m. At 05/03/2022 encounter     Cervical discitis 11/10/2022   Chronic back pain    COPD (chronic obstructive pulmonary disease) (HCC)    Disturbance of skin sensation 05/22/2018   Effusion of right knee 02/21/2023   Effusion of right knee joint 01/16/2023   Elevated troponin 04/22/2023   Essential hypertension 03/05/2007   Qualifier: Diagnosis of   By: Krystal MD, Reyes LABOR      Finger pain, left 11/08/2022   Gastric erosion    Gastritis and gastroduodenitis    Gastrointestinal hemorrhage with melena 11/20/2018   GIB (gastrointestinal bleeding) 07/18/2019   Hardware complicating wound infection 11/10/2022   Headache(784.0)    last one 6 months ago   Hemorrhoids, internal    High risk medication use 03/03/2022   History of duodenal ulcer    History of fusion of cervical spine 03/03/2022   With persistent cervical pain and palpable screws  Led to disability  History of attempt to dig furrow in skull to fix it  Last surgery 1992     History of lumbar fusion 03/03/2022   RE-OPERATIVE DIAGNOSIS:   lumbar stenosis synovial cyst lumbar spondylosis spondylolisthesis lumbar radiculoapthy L4/5   PROCEDURE:  Procedure(s): POSTERIOR LUMBAR FUSION 1 LEVEL with resection of synovial cyst     History of upper gastrointestinal bleeding 03/03/2022   Duodenal ulcer 2021 Dr. Avram   HLD (hyperlipidemia)    Hypertension    Hypokalemia 04/22/2023   Hypomagnesemia 04/22/2023   Infected blister of left index finger 11/07/2022   Intractable pain 03/03/2022   Loss of weight    Wt Readings from Last 10 Encounters:  04/05/22  146 lb (66.2 kg)  03/03/22  143 lb 9.6 oz (65.1 kg)  07/14/21  153 lb (69.4 kg)  12/14/20  147 lb 12.8 oz (67 kg)  11/13/19  154 lb (69.9 kg)  09/16/19  146 lb (66.2 kg)  08/06/19  146 lb (66.2 kg)  07/19/19  144 lb 13.5 oz (65.7 kg)  07/17/19  148 lb (67.1 kg)  07/09/19  147 lb 9.6 oz (67 kg)         MSSA bacteremia 11/07/2022   Neck rigidity    post cervical fusion   Nocturia    PAIN, CHRONIC NEC 10/06/2006   Qualifier: Diagnosis of   By: Krystal RN, Leeroy       Pneumonia    Prostate disease    Right sided temporal headache 05/22/2018   S/P cervical spinal fusion 03/03/2022   With persistent cervical pain and palpable screws Led to disability History of attempt to dig furrow in skull to  fix it Last surgery 1992   Senile ecchymosis 01/21/2019   Septic arthritis of wrist, left (HCC) 11/08/2022   Septic infrapatellar bursitis of right knee 11/07/2022   Staphylococcal Arthritis of Right Knee (Updated 01/30/2023) MRI 01/17/2023 shows worsening findings:  Worsening tear of posterior horn medial meniscus with large radial component Worsening subcortical stress fracture/osteochondral lesion of medial femoral condyle New subcortical stress fracture/osteochondral lesion of medial tibial plateau Moderate-to-large effusion with worsened synovitis    Staphylococcal arthritis of left wrist (HCC) 11/07/2022   Staphylococcal arthritis of right knee (HCC) 11/08/2022   Syncope  11/05/2022   Underweight on examination 05/03/2022   Past Surgical History:  Past Surgical History:  Procedure Laterality Date   AORTIC VALVE REPLACEMENT N/A 11/08/2023   Procedure: AORTIC VALVE REPLACEMENT USING A INSPIRIS VALVE.;  Surgeon: [name], MD;  Location: [facility] OR;  Service: Open Heart Surgery;  Laterality: N/A;   BIOPSY  07/19/2019   Procedure: BIOPSY;  Surgeon: San Sandor GAILS, DO;  Location: [facility];  Service: Gastroenterology;;   CERVICAL FUSION  1992   C2/C 3  four surgeries   CLIPPING OF ATRIAL APPENDAGE N/A 11/08/2023   Procedure: CLIPPING OF LEFT ATRIAL APPENDAGE USING A CLIP;  Surgeon: [name], MD;  Location: [facility];  Service: Open Heart Surgery;  Laterality: N/A;   COLONOSCOPY     ESOPHAGOGASTRODUODENOSCOPY  07/20/2011   Procedure: ESOPHAGOGASTRODUODENOSCOPY (EGD);  Surgeon: Lupita FORBES Commander, MD;  Location: [facility];  Service: Endoscopy;  Laterality: N/A;   ESOPHAGOGASTRODUODENOSCOPY (EGD) WITH PROPOFOL  N/A 07/19/2019   Procedure: ESOPHAGOGASTRODUODENOSCOPY (EGD) WITH PROPOFOL ;  Surgeon: San Sandor GAILS, DO;  Location: [facility];  Service: Gastroenterology;  Laterality: N/A;   I & D EXTREMITY Left 11/09/2022   Procedure: IRRIGATION AND DEBRIDEMENT LEFT WRIST;  Surgeon: Arlinda Buster, MD;  Location: [facility] OR;  Service: Orthopedics;  Laterality: Left;   INTRAOPERATIVE TRANSESOPHAGEAL ECHOCARDIOGRAM N/A 11/08/2023   Procedure: ECHOCARDIOGRAM, TRANSESOPHAGEAL, INTRAOPERATIVE;  Surgeon: [name], MD;  Location: [facility] OR;  Service: Open Heart Surgery;  Laterality: N/A;   RIGHT/LEFT HEART CATH AND CORONARY ANGIOGRAPHY N/A 11/06/2023   Procedure: RIGHT/LEFT HEART CATH AND CORONARY ANGIOGRAPHY;  Surgeon: Wonda Sharper, MD;  Location: [facility] INVASIVE CV LAB;  Service: Cardiovascular;  Laterality: N/A;   SAVORY DILATION  07/20/2011   Procedure: SAVORY DILATION;  Surgeon: Lupita FORBES Commander, MD;  Location: [facility];  Service: Endoscopy;  Laterality: N/A;    SPINAL FUSION  05/06/11   TRANSESOPHAGEAL ECHOCARDIOGRAM (CATH LAB) N/A 07/24/2023   Procedure: TRANSESOPHAGEAL ECHOCARDIOGRAM;  Surgeon: Pietro Redell RAMAN, MD;  Location: [facility] INVASIVE CV LAB;  Service: Cardiovascular;  Laterality: N/A;    Assessment & Plan Clinical Impression: Patient is a 78 y.o. year old male with PMHx of hypertension, hyperlipidemia, uncontrolled T2DM (last HgbA1C 8.4), paroxysmal atrial fibrillation not on anticoagulation due to falls, BPH, history of GI bleed due to duodenal ulcer, AKI on CKD stage IIIb, peripheral arterial disease, chronic iron deficiency anemia, prior tobacco abuse quit 5 years ago, COPD, combined systolic and diastolic heart failure, and degenerative spine disease S/P multiple surgeries.  He was seen by cardiothoracic surgery on 08/30/2023 with plan for open surgical aortic valve replacement as patient was not a candidate for TAVR.  He presented to [facility] 11/01/23 with complaints of exertional dyspnea barely able to walk 5 feet without shortness of breath and overall weakness.  ED labs: Sodium bicarb 18, BUN 48, serum creatinine 3.0, proBNP 634, and troponin 67> 62  likely due to demand ischemia. Abnormal chest x-ray showing possible small nodular opacity over the right mid to lower lung with recommended chest x-ray in 3 months along with noncontrast chest CT.  EKG negative for acute acute ischemia and showed NSR with prolonged QTc.  Blood cultures were collected and showed no growth.  An echo revealed LVEF 60 to 65% and mild aortic regurgitation but the aortic valve is not well-visualized.  Cardiac MRI suggestive of severe AI.  The patient underwent an aortic valve replacement with a 21 mm Edwards Inspiris bioprosthetic valve on 11/08/2023 by Dr. Con Clunes.  Following the procedure, patient was admitted to surgical ICU and was hemodynamically stable. He was weaned from the ventilator per protocol but noted to have decreased consciousness,  disorientation and not following commands. Hospital course complicated by right facial droop along with bilateral arm and lower extremity weakness and right side neglect. CT scan head/neck was negative for acute findings. Neurology consulted and he was loaded with Keppra for suspected seizure activity. An EEG revealed no seizures or epileptiform discharges. There was evidence of generalized cerebral dysfunction/encephalopathy felt to be related to sedation. Neurology recommended MRI of brain on 10/10/205 that showed moderately severe chronic small vessel ischemic disease but not acute abnormality.  The patient developed atrial fibrillation with RVR and was started on IV amiodarone. AKI on CKD stage 3b, creatinine is not improved. He was maintained on Heparin  for DVT prophylaxis. Pt reports poor sleep at night. Constipation treated with laxatives with improvement.  Per chart review the patient currently lives alone in a 1 level home and was able to complete ADLs with frequent rest/sit breaks. He currently requires Min A to CGA with mobility and basic ADLs. Therapy evaluations completed due to patient decreased functional mobility was admitted for a comprehensive rehab program.   Patient currently requires min with mobility secondary to muscle weakness, decreased cardiorespiratoy endurance, and decreased standing balance, decreased postural control, decreased balance strategies, and difficulty maintaining precautions.  Prior to hospitalization, patient was modified independent  with mobility and lived with Alone in a House home.  Home access is  Level entry.  Patient will benefit from skilled PT intervention to maximize safe functional mobility, minimize fall risk, and decrease caregiver burden for planned discharge home with intermittent assist.  Anticipate patient will benefit from follow up OP at discharge.  PT - End of Session Activity Tolerance: Tolerates 30+ min activity with multiple rests Endurance  Deficit: Yes Endurance Deficit Description: required rest breaks throughout due to mild SOB PT Assessment Rehab Potential (ACUTE/IP ONLY): Good PT Barriers to Discharge: Decreased caregiver support;Wound Care;Weight bearing restrictions;Other (comments) PT Barriers to Discharge Comments: sternal precautions, lives alone PT Patient demonstrates impairments in the following area(s): Balance;Edema;Endurance;Motor;Nutrition;Pain;Safety;Sensory;Skin Integrity PT Transfers Functional Problem(s): Bed Mobility;Bed to Chair;Car;Furniture PT Locomotion Functional Problem(s): Ambulation;Wheelchair Mobility;Stairs PT Plan PT Intensity: Minimum of 1-2 x/day ,45 to 90 minutes PT Frequency: 5 out of 7 days PT Duration Estimated Length of Stay: 5-7 days PT Treatment/Interventions: Ambulation/gait training;Functional mobility training;Discharge planning;Psychosocial support;Therapeutic Activities;Balance/vestibular training;Disease management/prevention;Neuromuscular re-education;Skin care/wound management;Therapeutic Exercise;Wheelchair propulsion/positioning;DME/adaptive equipment instruction;Pain management;UE/LE Strength taining/ROM;Community reintegration;Patient/family education;Stair training;UE/LE Coordination activities PT Transfers Anticipated Outcome(s): mod I with LRAD PT Locomotion Anticipated Outcome(s): mod I with LRAD PT Recommendation Follow Up Recommendations: Other (comment) (outpatient cardiac rehab) Patient destination: Home Equipment Recommended: To be determined Equipment Details: has RW and walking sticks   PT Evaluation Precautions/Restrictions Precautions Precautions: Fall;Sternal Precaution Booklet Issued: Yes (comment) Recall of Precautions/Restrictions: Impaired Restrictions Weight Bearing Restrictions  Per Provider Order: No Other Position/Activity Restrictions: Sternal Pain Interference Pain Interference Pain Effect on Sleep: 1. Rarely or not at all Pain Interference  with Therapy Activities: 1. Rarely or not at all Pain Interference with Day-to-Day Activities: 1. Rarely or not at all Home Living/Prior Functioning Home Living Available Help at Discharge: Family;Available PRN/intermittently Type of Home: House Home Access: Level entry Home Layout: One level Bathroom Shower/Tub: Engineer, manufacturing systems: Standard Bathroom Accessibility: Yes Additional Comments: has RW and walking sticks; was using walking sticks  Lives With: Alone Prior Function Level of Independence: Requires assistive device for independence;Independent with basic ADLs;Independent with transfers;Independent with homemaking with ambulation;Independent with gait  Able to Take Stairs?: Yes Driving: No Vocation: Retired Optometrist - History Ability to See in Adequate Light: 0 Adequate Perception Perception: Within Functional Limits Praxis Praxis: WFL  Cognition Overall Cognitive Status: Within Functional Limits for tasks assessed Arousal/Alertness: Awake/alert Orientation Level: Oriented X4 Memory: Appears intact Awareness: Appears intact Problem Solving: Appears intact Safety/Judgment: Appears intact Sensation Sensation Light Touch: Impaired Detail Light Touch Impaired Details: Impaired RLE Hot/Cold: Not tested Proprioception: Appears Intact Stereognosis: Not tested Additional Comments: decreased sensation along R great toe and R shin Coordination Gross Motor Movements are Fluid and Coordinated: Yes Fine Motor Movements are Fluid and Coordinated: Yes Coordination and Movement Description: generalized weakness and deconditioning Finger Nose Finger Test: [facility] bilaterally Motor  Motor Motor: Within Functional Limits  Trunk/Postural Assessment  Cervical Assessment Cervical Assessment: Exceptions to [facility] (forward head) Thoracic Assessment Thoracic Assessment: Exceptions to [facility] (thoacic rounding) Lumbar Assessment Lumbar Assessment: Exceptions to  [facility] (posterior pelvic tilt) Postural Control Postural Control: Within Functional Limits  Balance Balance Balance Assessed: Yes Static Sitting Balance Static Sitting - Balance Support: No upper extremity supported Static Sitting - Level of Assistance: 6: Modified independent (Device/Increase time) Dynamic Sitting Balance Dynamic Sitting - Balance Support: Feet supported;No upper extremity supported Dynamic Sitting - Level of Assistance: 6: Modified independent (Device/Increase time) Static Standing Balance Static Standing - Balance Support: No upper extremity supported;During functional activity Static Standing - Level of Assistance: 5: Stand by assistance (CGA) Dynamic Standing Balance Dynamic Standing - Balance Support: No upper extremity supported;During functional activity Dynamic Standing - Level of Assistance: 4: Min assist Dynamic Standing - Comments: with transfers and ambulation Extremity Assessment  RLE Assessment RLE Assessment: Not tested General Strength Comments: not formally tested; grossly 4-/5 LLE Assessment LLE Assessment: Not tested General Strength Comments: not formally tested; grossly 4-/5  Care Tool Care Tool Bed Mobility Roll left and right activity   Roll left and right assist level: Supervision/Verbal cueing    Sit to lying activity   Sit to lying assist level: Supervision/Verbal cueing    Lying to sitting on side of bed activity   Lying to sitting on side of bed assist level: the ability to move from lying on the back to sitting on the side of the bed with no back support.: Supervision/Verbal cueing     Care Tool Transfers Sit to stand transfer   Sit to stand assist level: Supervision/Verbal cueing    Chair/bed transfer   Chair/bed transfer assist level: Minimal Assistance - Patient > 75%    Car transfer   Car transfer assist level: Contact Guard/Touching assist      Care Tool Locomotion Ambulation   Assist level: Minimal Assistance -  Patient > 75% Assistive device: No Device Max distance: 120ft  Walk 10 feet activity   Assist level: Minimal Assistance - Patient >  75% Assistive device: No Device   Walk 50 feet with 2 turns activity   Assist level: Minimal Assistance - Patient > 75% Assistive device: No Device  Walk 150 feet activity   Assist level: Minimal Assistance - Patient > 75% Assistive device: No Device  Walk 10 feet on uneven surfaces activity   Assist level: Minimal Assistance - Patient > 75%    Stairs   Assist level: Minimal Assistance - Patient > 75% Stairs assistive device: 1 hand rail Max number of stairs: 12 (6in)  Walk up/down 1 step activity   Walk up/down 1 step (curb) assist level: Minimal Assistance - Patient > 75% Walk up/down 1 step or curb assistive device: 1 hand rail  Walk up/down 4 steps activity   Walk up/down 4 steps assist level: Minimal Assistance - Patient > 75% Walk up/down 4 steps assistive device: 1 hand rail  Walk up/down 12 steps activity   Walk up/down 12 steps assist level: Minimal Assistance - Patient > 75% Walk up/down 12 steps assistive device: 1 hand rail  Pick up small objects from floor        Wheelchair            Wheel 50 feet with 2 turns activity      Wheel 150 feet activity        Refer to Care Plan for Long Term Goals  SHORT TERM GOAL WEEK 1 PT Short Term Goal 1 (Week 1): STG=LTG due to LOS  Recommendations for other services: None   Skilled Therapeutic Intervention Evaluation completed (see details above and below) with education on PT POC and goals and individual treatment initiated with focus on functional mobility/transfers, generalized strengthening and endurance, dynamic standing balance/coordination, simulated car transfers, stair navigation, and ambulation. Received pt semi-reclined in bed, pt educated on PT evaluation, CIR policies, and therapy schedule and agreeable. Pt denied any pain during session. Pt transferred semi-reclined<>sitting  R EOB with HOB elevated and supervision and son arrived.  Pt performed all transfers without AD and CGA/min A throughout session - pt required occasional cues to adhere to sternal precautions. Pt transported to/from room in [facility] dependently for time management purposes. Pt performed simulated car transfer without AD and CGA, then ambulated 40ft on uneven surfaces (ramp) without UE support and light min A. In main therapy gym, pt navigated 12 in steps with 1 handrail and CGA/light min A ascending and descending with a step through pattern. Pt then ambulated 169ft without AD and CGA/light min A - pt with mild SOB afterwards (SPO2 98% and HR 90bpm). On Nustep, pt performed seated BLE strengthening on level 3 for 8 minutes for a total of 489 steps with emphasis on cardiovascular endurance - HR 85bpm during activity - 0.3 miles, 49 SPM, and 1.6 METs.  Pt performed the following standing exercises with emphasis on LE strength with seated rest breaks in between: -alternating marches 2x12 bilaterally with 2lb ankle weight -hip abduction 2x12 bilaterally with 2lb ankle weight -mini squats 2x10 Transported back to [facility] nurses station and pt ambulated additional 68ft without AD and min A back to room. Transitioned into supine and concluded session with pt semi-reclined in bed, needs within reach, and bed alarm on. Safety plan updated and son at bedside.   Mobility Bed Mobility Bed Mobility: Rolling Right;Rolling Left;Sit to Supine;Supine to Sit Rolling Right: Supervision/verbal cueing Rolling Left: Supervision/Verbal cueing Supine to Sit: Supervision/Verbal cueing Sit to Supine: Supervision/Verbal cueing Transfers Transfers: Sit to Stand;Stand to Sit;Stand Pivot Transfers  Sit to Stand: Contact Guard/Touching assist Stand to Sit: Contact Guard/Touching assist Stand Pivot Transfers: Minimal Assistance - Patient > 75% Stand Pivot Transfer Details: Verbal cues for precautions/safety Stand Pivot Transfer  Details (indicate cue type and reason): cues to maintain sternal precautions Transfer (Assistive device): None Locomotion  Gait Ambulation: Yes Gait Assistance: Minimal Assistance - Patient > 75% Gait Distance (Feet): 180 Feet Assistive device: None Gait Gait: Yes Gait Pattern: Impaired Gait Pattern: Step-to pattern;Step-through pattern;Decreased step length - right;Decreased step length - left;Trunk flexed;Poor foot clearance - left;Poor foot clearance - right;Narrow base of support Gait velocity: decreased Stairs / Additional Locomotion Stairs: Yes Stairs Assistance: Minimal Assistance - Patient > 75% Stair Management Technique: One rail Right;Alternating pattern;Forwards Number of Stairs: 12 Height of Stairs: 6 Ramp: Minimal Assistance - Patient >75% (no AD) Wheelchair Mobility Wheelchair Mobility: Yes Wheelchair Assistance: Dependent - Patient 0% Wheelchair Parts Management: Needs assistance Distance: >151ft   Discharge Criteria: Patient will be discharged from PT if patient refuses treatment 3 consecutive times without medical reason, if treatment goals not met, if there is a change in medical status, if patient makes no progress towards goals or if patient is discharged from hospital.  The above assessment, treatment plan, treatment alternatives and goals were discussed and mutually agreed upon: by patient and by family  Therisa CHRISTELLA Delorse Therisa Delorse PT, DPT 11/16/2023, 12:09 PM

## 2023-11-16 NOTE — Plan of Care (Signed)
  Problem: RH Grooming Goal: LTG Patient will perform grooming w/assist,cues/equip (OT) Description: LTG: Patient will perform grooming with assist, with/without cues using equipment (OT) Flowsheets (Taken 11/16/2023 1547) LTG: Pt will perform grooming with assistance level of: Independent with assistive device    Problem: RH Bathing Goal: LTG Patient will bathe all body parts with assist levels (OT) Description: LTG: Patient will bathe all body parts with assist levels (OT) Flowsheets (Taken 11/16/2023 1547) LTG: Pt will perform bathing with assistance level/cueing: Independent with assistive device    Problem: RH Dressing Goal: LTG Patient will perform upper body dressing (OT) Description: LTG Patient will perform upper body dressing with assist, with/without cues (OT). Flowsheets (Taken 11/16/2023 1547) LTG: Pt will perform upper body dressing with assistance level of: Independent with assistive device Goal: LTG Patient will perform lower body dressing w/assist (OT) Description: LTG: Patient will perform lower body dressing with assist, with/without cues in positioning using equipment (OT) Flowsheets (Taken 11/16/2023 1547) LTG: Pt will perform lower body dressing with assistance level of: Independent with assistive device   Problem: RH Toileting Goal: LTG Patient will perform toileting task (3/3 steps) with assistance level (OT) Description: LTG: Patient will perform toileting task (3/3 steps) with assistance level (OT)  Flowsheets (Taken 11/16/2023 1547) LTG: Pt will perform toileting task (3/3 steps) with assistance level: Independent with assistive device   Problem: RH Simple Meal Prep Goal: LTG Patient will perform simple meal prep w/assist (OT) Description: LTG: Patient will perform simple meal prep with assistance, with/without cues (OT). Flowsheets (Taken 11/16/2023 1547) LTG: Pt will perform simple meal prep with assistance level of: Independent with assistive device    Problem: RH Laundry Goal: LTG Patient will perform laundry w/assist, cues (OT) Description: LTG: Patient will perform laundry with assistance, with/without cues (OT). Flowsheets (Taken 11/16/2023 1547) LTG: Pt will perform laundry with assistance level of: Independent with assistive device   Problem: RH Toilet Transfers Goal: LTG Patient will perform toilet transfers w/assist (OT) Description: LTG: Patient will perform toilet transfers with assist, with/without cues using equipment (OT) Flowsheets (Taken 11/16/2023 1547) LTG: Pt will perform toilet transfers with assistance level of: Independent with assistive device   Problem: RH Tub/Shower Transfers Goal: LTG Patient will perform tub/shower transfers w/assist (OT) Description: LTG: Patient will perform tub/shower transfers with assist, with/without cues using equipment (OT) Flowsheets (Taken 11/16/2023 1547) LTG: Pt will perform tub/shower stall transfers with assistance level of: Independent with assistive device   Problem: RH Awareness Goal: LTG: Patient will demonstrate awareness during functional activites type of (OT) Description: LTG: Patient will demonstrate awareness during functional activites type of (OT) Flowsheets (Taken 11/16/2023 1547) LTG: Patient will demonstrate awareness during functional activites type of (OT): Modified Independent Note: Safety awareness with sternal precautions during activity

## 2023-11-16 NOTE — Progress Notes (Signed)
 Occupational Therapy Session Note  Patient Details  Name: Michael Bender MRN: [id number] Date of Birth: [birth year]  Today's Date: 11/16/2023 OT Individual Time: 0145-0255 OT Individual Time Calculation (min): 70 min    Short Term Goals: Week 1:  OT Short Term Goal 1 (Week 1): LTG=STG d/t ELOS  Skilled Therapeutic Interventions/Progress Updates:  Skilled OT session completed to address safety awareness with sternal precautions and overall activity tolerance. Pt received seated EOB, agreeable to participate in therapy. Pt reports no pain. All transfers completed this session ambulatory without AD requiring CGA.   Pt ambulated ~6ft+~75ft in hallway requiring one seated rest break. OT provided education sternal precautions and referenced pt binder in room to continue to reinforce adherence to precautions when performing STS. Pt reports being aware of precautions; however when experiencing fatigue does not remember them and prioritized rest. OT educates on 4 Ps of energy conservation and importance of rest before being too tired and placing emphasis on safety as a priority.  Pt verbalized understanding. 10x STS completed with CGA, VC to adhere to sternal precautions when sitting. Mat>nu-step for 10 minutes on level 4 for LB strengthening and activity tolerance. Pt able to sustain activity for 2 minutes requiring continued rest breaks throughout. Nu-step>WC. Pt completed dynamic standing balance activity 3x at BITS to increase balance and activity tolerance. Pt required VC to adhere to sternal precautions when sitting. Pt completed tub shower transfer 2x with grab bars requiring CGA when over tub. Pt instructed to only use grab bars to stabilize balance and not to bear weight. Pt dependently propelled back to room. WC>EOB CGA d/t LOB when standing without BUE support. Pt supine in bed with bed alarm on and all needs within reach.  Therapy Documentation Precautions:  Precautions Precautions: Fall,  Sternal Precaution Booklet Issued: Yes (comment) Recall of Precautions/Restrictions: Impaired Restrictions Weight Bearing Restrictions Per Provider Order: No Other Position/Activity Restrictions: Sternal  Therapy/Group: Individual Therapy  Heberto [place] Woods-Chance, MS, OTR/L 11/16/2023, 12:32 PM

## 2023-11-16 NOTE — Progress Notes (Signed)
 Inpatient Rehabilitation  Patient information reviewed and entered into eRehab system by [facility]. Karen Kays., CCC/SLP, PPS Coordinator.  Information including medical coding, functional ability and quality indicators will be reviewed and updated through discharge.

## 2023-11-17 DIAGNOSIS — R5381 Other malaise: Secondary | ICD-10-CM | POA: Diagnosis not present

## 2023-11-17 LAB — GLUCOSE, CAPILLARY
Glucose-Capillary: 128 mg/dL — ABNORMAL HIGH (ref 70–99)
Glucose-Capillary: 230 mg/dL — ABNORMAL HIGH (ref 70–99)
Glucose-Capillary: 299 mg/dL — ABNORMAL HIGH (ref 70–99)
Glucose-Capillary: 208 mg/dL — ABNORMAL HIGH (ref 70–99)

## 2023-11-17 MED ORDER — TRAZODONE HCL 50 MG PO TABS
100.0000 mg | ORAL_TABLET | Freq: Every day | ORAL | Status: DC
  Administered 2023-11-17: 100 mg via ORAL
  Filled 2023-11-17: qty 2

## 2023-11-17 MED ORDER — VITAMIN D 25 MCG (1000 UNIT) PO TABS
2000.0000 [IU] | ORAL_TABLET | Freq: Every day | ORAL | Status: DC
  Administered 2023-11-17 – 2023-11-20 (×4): 2000 [IU] via ORAL
  Filled 2023-11-17 (×4): qty 2

## 2023-11-17 NOTE — Progress Notes (Signed)
 Occupational Therapy Session Note  Patient Details  Name: Michael Bender MRN: [id number] Date of Birth: 09-18-1945  Session 1 Today's Date: 11/17/2023 OT Individual Time: 1101-1157 OT Individual Time Calculation (min): 56 min  Session 2 Today's Date: 11/17/2023 OT Individual Time: 1345-1445 OT Individual Time Calculation (min): 60 min    Short Term Goals: Week 1:  OT Short Term Goal 1 (Week 1): LTG=STG d/t ELOS  Skilled Therapeutic Interventions/Progress Updates:  Session 1: Skilled OT session completed to address dynamic standing balance during functional activity. Pt received supine in bed, agreeable to participate in therapy. Pt reports lack of sleep at night since Thursday, MD notified. All transfers this session completed CGA ambulatory without AD.   Pt shirt soiled from sweat, requested to change. UB dressing completed with set-up. EOB>WC, pt dependently transferred to gym. Pt instructed on connect 4 in standing to address activity tolerance and dynamic standing balance. VC to adhere to sternal precautions when completing STS, pt continues to attempt to push up from [facility] to reach standing. OT provided education on sternal precautions to promote carryover. Pt completed horse shoe activity in standing with CGA, pt displaying LOB when attempting to stand without a wide base of support. OT provided education on safety awareness and posture prior to standing, pt displayed understanding by correcting posture with next stand. Pt propelled back to room, WC>EOB, supine in bed with all needs met.  Session 2: Skilled OT session completed to address community mobility and IADLs. Pt received supine in bed, agreeable to participate in therapy. Pt reports no pain, All transfers this session ambulatory completed with CGA.   EOB>WC without AD, pt requested to go outside to walk. Reports concerns about pet mgmt within the home and walking his dog up/down hills as well as fatigue outside. Pt  dependently propelled through facility outside. OT provided education on ensuring there are places to sit when walking dog and d/t sternal precautions not letting dog pull on leash, pt verbalizes understanding and reports does not use leash with dog. Pt completes functional mobility outdoors across different terrains, VC provided to incorporate rest breaks and reduce pacing of task. Pt also cued to locate benches throughout courtyard to promote rest breaks.   Pt dependently propelled to ADL apt, OT instructed pt to locate 10/10 items in kitchen while adhering to sternal precautions. Pt ambulated through kitchen with CGA locating 10/10 items at various heights. OT provided education on limiting overhead reach within the home d/t sternal precautions, pt verbalized understanding and will ask son to A if needed. Pt returned to room, requesting to complete oral care. Pt self-propelled to sink, completed oral care with Ind.  WC>EOB, supine in bed with all needs met. Therapy Documentation Precautions:  Precautions Precautions: Fall, Sternal Precaution Booklet Issued: Yes (comment) Recall of Precautions/Restrictions: Impaired Restrictions Weight Bearing Restrictions Per Provider Order: No Other Position/Activity Restrictions: Sternal   Therapy/Group: Individual Therapy  Oneita Allmon Woods-Chance, MS, OTR/L 11/17/2023, 7:48 AM

## 2023-11-17 NOTE — Plan of Care (Signed)
  Problem: Consults Goal: RH GENERAL PATIENT EDUCATION Description: See Patient Education module for education specifics. Outcome: Progressing   Problem: RH SKIN INTEGRITY Goal: RH STG SKIN FREE OF INFECTION/BREAKDOWN Description: Manage skin free of infection with mod I assistance  Outcome: Progressing

## 2023-11-17 NOTE — Progress Notes (Signed)
 Physical Therapy Session Note  Patient Details  Name: Michael Bender MRN: [id number] Date of Birth: 08-Jun-1945  Today's Date: 11/17/2023 PT Individual Time: 0730-0808 PT Individual Time Calculation (min): 38 min Today's Date: 11/17/2023 PT Missed Time: 7 Minutes and 45 minutes  Missed Time Reason: Patient fatigue  Short Term Goals: Week 1:  PT Short Term Goal 1 (Week 1): STG=LTG due to LOS  Skilled Therapeutic Interventions/Progress Updates:   Treatment Session 1 Received pt sitting on commode with RN - PT took over with care. Pt agreeable to PT treatment and denied any pain during session. Session with emphasis on functional mobility/transfers, toileting, generalized strengthening and endurance, dynamic standing balance/coordination, and ambulation. Pt frustrated from not being able to sleep since Thursday and upset that it is affecting his progress with therapy - MD notified.   Pt performed all transfers without AD and CGA throughout session, however pt appears very unsteady this morning (suspect due to fatigue). Donned shoes with supervision and pt began ambulating towards gym but only able to make it ~97ft prior to having to stop in the middle of hallway to sit on chair due to fatigue. Retrieved WC and transported to/from gym in [facility] dependently for energy conservation purposes.   Due to fatigue, opted for seated exercises - pt performed the following exercises with emphasis on LE strength/ROM: -knee extensions with 1.5lb ankle weight 2x12 bilaterally -hip flexion with 1.5lb ankle weight 2x12 bilaterally -hip abduction with red TB 2x15 -hamstring curls with red TB 2x12 bilaterally -hip adduction isometrics 2x10 with 5 second hold Returned to room and transferred into supine with supervision. Concluded session with pt semi-reclined in bed, needs within reach, and bed alarm on. 7 minutes missed of skilled physical therapy due to fatigue.   Treatment Session 2 Received pt  semi-reclined in bed asleep. Did not attempt to wake pt, as he reports not sleeping since last Thursday. Will attempt to make up missed time as schedule allows and pt is able to tolerate. 45 minutes missed of skilled physical therapy due to fatigue.   Therapy Documentation Precautions:  Precautions Precautions: Fall, Sternal Precaution Booklet Issued: Yes (comment) Recall of Precautions/Restrictions: Impaired Restrictions Weight Bearing Restrictions Per Provider Order: No Other Position/Activity Restrictions: Sternal  Therapy/Group: Individual Therapy Therisa HERO Zaunegger Therisa Stains PT, DPT 11/17/2023, 6:57 AM

## 2023-11-17 NOTE — Progress Notes (Signed)
 Inpatient Rehabilitation Care Coordinator Assessment and Plan Patient Details  Name: Michael Bender MRN: [id number] Date of Birth: [birth year]  Today's Date: 79/17/2025  Hospital Problems: Principal Problem:   Debility  Past Medical History:  Past Medical History:  Diagnosis Date   Acute combined systolic and diastolic heart failure (HCC) 04/22/2023   Acute on chronic diastolic CHF (congestive heart failure) (HCC) 04/20/2023   AKI (acute kidney injury) 10/28/2022   Arthritis    Back   Balance problems 02/21/2023   Blood transfusion    as a child   Cachexia 05/03/2022   Added based on temporal wasting noted on exam, Body mass index is 19.92 kg/m. At 05/03/2022 encounter     Cervical discitis 11/10/2022   Chronic back pain    COPD (chronic obstructive pulmonary disease) (HCC)    Disturbance of skin sensation 05/22/2018   Effusion of right knee 02/21/2023   Effusion of right knee joint 01/16/2023   Elevated troponin 04/22/2023   Essential hypertension 03/05/2007   Qualifier: Diagnosis of   By: Krystal MD, Reyes LABOR      Finger pain, left 11/08/2022   Gastric erosion    Gastritis and gastroduodenitis    Gastrointestinal hemorrhage with melena 11/20/2018   GIB (gastrointestinal bleeding) 07/18/2019   Hardware complicating wound infection 11/10/2022   Headache(784.0)    last one 6 months ago   Hemorrhoids, internal    High risk medication use 03/03/2022   History of duodenal ulcer    History of fusion of cervical spine 03/03/2022   With persistent cervical pain and palpable screws  Led to disability  History of attempt to dig furrow in skull to fix it  Last surgery 1992     History of lumbar fusion 03/03/2022   RE-OPERATIVE DIAGNOSIS:  lumbar stenosis synovial cyst lumbar spondylosis spondylolisthesis lumbar radiculoapthy L4/5   PROCEDURE:  Procedure(s): POSTERIOR LUMBAR FUSION 1 LEVEL with resection of synovial cyst     History of upper gastrointestinal bleeding  03/03/2022   Duodenal ulcer 2021 Dr. Avram   HLD (hyperlipidemia)    Hypertension    Hypokalemia 04/22/2023   Hypomagnesemia 04/22/2023   Infected blister of left index finger 11/07/2022   Intractable pain 03/03/2022   Loss of weight    Wt Readings from Last 10 Encounters:  04/05/22  146 lb (66.2 kg)  03/03/22  143 lb 9.6 oz (65.1 kg)  07/14/21  153 lb (69.4 kg)  12/14/20  147 lb 12.8 oz (67 kg)  11/13/19  154 lb (69.9 kg)  09/16/19  146 lb (66.2 kg)  08/06/19  146 lb (66.2 kg)  07/19/19  144 lb 13.5 oz (65.7 kg)  07/17/19  148 lb (67.1 kg)  07/09/19  147 lb 9.6 oz (67 kg)         MSSA bacteremia 11/07/2022   Neck rigidity    post cervical fusion   Nocturia    PAIN, CHRONIC NEC 10/06/2006   Qualifier: Diagnosis of   By: Krystal RN, Leeroy       Pneumonia    Prostate disease    Right sided temporal headache 05/22/2018   S/P cervical spinal fusion 03/03/2022   With persistent cervical pain and palpable screws Led to disability History of attempt to dig furrow in skull to fix it Last surgery 1992   Senile ecchymosis 01/21/2019   Septic arthritis of wrist, left (HCC) 11/08/2022   Septic infrapatellar bursitis of right knee 11/07/2022   Staphylococcal Arthritis of Right Knee (Updated 01/30/2023) MRI  01/17/2023 shows worsening findings:  Worsening tear of posterior horn medial meniscus with large radial component Worsening subcortical stress fracture/osteochondral lesion of medial femoral condyle New subcortical stress fracture/osteochondral lesion of medial tibial plateau Moderate-to-large effusion with worsened synovitis    Staphylococcal arthritis of left wrist (HCC) 11/07/2022   Staphylococcal arthritis of right knee (HCC) 11/08/2022   Syncope 11/05/2022   Underweight on examination 05/03/2022   Past Surgical History:  Past Surgical History:  Procedure Laterality Date   AORTIC VALVE REPLACEMENT N/A 11/08/2023   Procedure: AORTIC VALVE REPLACEMENT USING A INSPIRIS VALVE.;   Surgeon: [name], MD;  Location: [facility] OR;  Service: Open Heart Surgery;  Laterality: N/A;   BIOPSY  07/19/2019   Procedure: BIOPSY;  Surgeon: San Sandor GAILS, DO;  Location: [facility];  Service: Gastroenterology;;   CERVICAL FUSION  1992   C2/C 3  four surgeries   CLIPPING OF ATRIAL APPENDAGE N/A 11/08/2023   Procedure: CLIPPING OF LEFT ATRIAL APPENDAGE USING A CLIP;  Surgeon: [name], MD;  Location: [facility] OR;  Service: Open Heart Surgery;  Laterality: N/A;   COLONOSCOPY     ESOPHAGOGASTRODUODENOSCOPY  07/20/2011   Procedure: ESOPHAGOGASTRODUODENOSCOPY (EGD);  Surgeon: Lupita FORBES Commander, MD;  Location: [facility];  Service: Endoscopy;  Laterality: N/A;   ESOPHAGOGASTRODUODENOSCOPY (EGD) WITH PROPOFOL  N/A 07/19/2019   Procedure: ESOPHAGOGASTRODUODENOSCOPY (EGD) WITH PROPOFOL ;  Surgeon: San Sandor GAILS, DO;  Location: [facility];  Service: Gastroenterology;  Laterality: N/A;   I & D EXTREMITY Left 11/09/2022   Procedure: IRRIGATION AND DEBRIDEMENT LEFT WRIST;  Surgeon: Arlinda Buster, MD;  Location: [facility] OR;  Service: Orthopedics;  Laterality: Left;   INTRAOPERATIVE TRANSESOPHAGEAL ECHOCARDIOGRAM N/A 11/08/2023   Procedure: ECHOCARDIOGRAM, TRANSESOPHAGEAL, INTRAOPERATIVE;  Surgeon: [name], MD;  Location[facility] OR;  Service: Open Heart Surgery;  Laterality: N/A;   RIGHT/LEFT HEART CATH AND CORONARY ANGIOGRAPHY N/A 11/06/2023   Procedure: RIGHT/LEFT HEART CATH AND CORONARY ANGIOGRAPHY;  Surgeon: Wonda Sharper, MD;  Location: [facility] INVASIVE CV LAB;  Service: Cardiovascular;  Laterality: N/A;   SAVORY DILATION  07/20/2011   Procedure: SAVORY DILATION;  Surgeon: Lupita FORBES Commander, MD;  Location: [facility];  Service: Endoscopy;  Laterality: N/A;   SPINAL FUSION  05/06/11   TRANSESOPHAGEAL ECHOCARDIOGRAM (CATH LAB) N/A 07/24/2023   Procedure: TRANSESOPHAGEAL ECHOCARDIOGRAM;  Surgeon: Pietro Redell RAMAN, MD;  Location: [facility] INVASIVE CV LAB;  Service: Cardiovascular;  Laterality: N/A;   Social  History:  reports that he quit smoking about 4 years ago. His smoking use included cigarettes. He started smoking about 44 years ago. He has never used smokeless tobacco. He reports that he does not currently use alcohol after a past usage of about 2.0 standard drinks of alcohol per week. He reports that he does not use drugs.  Family / Support Systems Marital Status: Single Patient Roles: Parent Children: [name] and Keneth Mink Anticipated Caregiver: Unknown at present Caregiver Availability: 24/7  Social History Preferred language: English Religion: Non-Denominational Education: High school Health Literacy - How often do you need to have someone help you when you read instructions, pamphlets, or other written material from your doctor or pharmacy?: Never Writes: Yes Employment Status: Disabled   Abuse/Neglect Abuse/Neglect Assessment Can Be Completed: Yes Physical Abuse: Denies Verbal Abuse: Denies Sexual Abuse: Denies Exploitation of patient/patient's resources: Denies Self-Neglect: Denies  Patient response to: Social Isolation - How often do you feel lonely or isolated from those around you?: Sometimes  Emotional Status Pt's affect, behavior and adjustment status:  Having a difficult time adjusting to therapy due to a lack of rest. Recent Psychosocial Issues: None Psychiatric History: Some Substance Abuse History: Former smoker  Patient / Field seismologist, Expectations & Goals Pt/Family understanding of illness & functional limitations: Patient somewhat understanding of illness & functional limitations Premorbid pt/family roles/activities: Active within the community Anticipated changes in roles/activities/participation: None anticipated Pt/family expectations/goals: Patient has realistic expectations  Recruitment consultant Agencies: None Premorbid Home Care/DME Agencies: Other (Comment) (grab bars, hand held shower head, RW, single point cane, BSC, shower  seat) Transportation available at discharge: Yes, [name] Is the patient able to respond to transportation needs?: Yes In the past 12 months, has lack of transportation kept you from medical appointments or from getting medications?: No In the past 12 months, has lack of transportation kept you from meetings, work, or from getting things needed for daily living?: No Resource referrals recommended: Neuropsychology  Discharge Planning Living Arrangements: Alone Support Systems: Children Type of Residence: Private residence Insurance Resources: Media planner (specify) (AETNA MEDICARE / AETNA MEDICARE HMO/PPO) Financial Resources: SSD Financial Screen Referred: No Living Expenses: Own Money Management: Patient Does the patient have any problems obtaining your medications?: No Home Management: Patient manages his home Patient/Family Preliminary Plans: Patient planning to return home alone with sons checking in on him Care Coordinator Anticipated Follow Up Needs: HH/OP Expected length of stay: 7-10 days  Clinical Impression CSW met with patient/family to introduce herself and complete initial assessment. Patient is AxOx4 and able to make all needs known. Patient is a 78 y/o single male who was admitted to [facility] following acute stay. He reports lack of sleep. Has a handicapped accessible home with DME. He does live alone with his sons living nearby to provide support if needed. DME: grab bars, hand held shower head, RW, single point cane, BSC, shower seat. 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 Therapy goals include regaining independence to leave prior to anticipated discharge date. Transportation will be provided by [place] upon discharge.There were no further needs or concerns at present. CSW will follow up with family and continue to follow.   [name] 11/17/2023, 10:50 AM

## 2023-11-17 NOTE — Progress Notes (Signed)
 Patient ID: Michael Bender, male   DOB: [birth year], 78 y.o.   MRN: [id number] Met with the patient to review current medical situation, rehab process, team conference and plan of care. Patient irritable; reported no sleep since last Thursday.  Reviewed medications on order for sleep and constipation. Discussed CMM diet and management of DM (A1C 7.7) with insulin . Patient questioning insulin  as that is what got him hospitalized (took 5x dose of insulin  PTA). Discussed insulin  administration using a pen and continue to practice self administration to discharge.  Anticipate will need additional information on home medications for HTN, HLD, A-fib, HF, etc. Endorses my chart access on home computer. Continue to follow along to address educational needs to facilitate preparation for discharge. [name]

## 2023-11-17 NOTE — Progress Notes (Signed)
 PROGRESS NOTE   Subjective/Complaints: No new complaints this morning Continues to have insomnia, discussed increasing trazodone to 100mg  and ordering grounds pass for daytime sunlight exposure  ROS: +insomnia   Objective:   No results found. Recent Labs    11/16/23 0542  WBC 6.5  HGB 11.6*  HCT 36.1*  PLT 164   Recent Labs    11/15/23 0408 11/16/23 0542  NA 139 139  K 3.7 3.5  CL 107 105  CO2 22 22  GLUCOSE 152* 158*  BUN 37* 31*  CREATININE 1.90* 1.58*  CALCIUM  7.7* 8.2*    Intake/Output Summary (Last 24 hours) at 11/17/2023 0934 Last data filed at 11/17/2023 0831 Gross per 24 hour  Intake 600 ml  Output --  Net 600 ml        Physical Exam: Vital Signs Blood pressure 135/69, pulse 98, temperature 99 F (37.2 C), temperature source Oral, resp. rate 18, height 5' 11 (1.803 m), weight 62.5 kg, SpO2 99%. Gen: no distress, normal appearing HEENT: oral mucosa pink and moist, NCAT Cardio: Reg rate Chest: normal effort, normal rate of breathing Abd: soft, non-distended Ext: no edema Psych: pleasant, normal affect Skin: sternal and abdominal incisions CDI, bruising in UEs  Neuro:     Mental Status: AAOx4, memory intact, Good insight and awareness Speech/Languate: Fluent, follows simple commands CRANIAL NERVES: II: PERRL. Visual fields full III, IV, VI: EOM intact, no gaze preference or deviation V: normal sensation bilaterally VII: no asymmetry VIII: normal hearing to speech IX, X: normal palatal elevation XI: 5/5 head turn and 5/5 shoulder shrug bilaterally XII: Tongue midline     MOTOR: RUE: at least 4/5 LUE:at least 4/5  RLE: HF 5/5, KE 5/5, ADF 5/5, APF 5/5 LLE: HF 5/5, KE 5/5, ADF 5/5, APF 5/5 Sternal precautions limits UE exam   REFLEXES: hyporeflexive b/l LE   SENSORY: Normal to touch all 4 extremities   Coordination: Normal finger to nose b/l, no  tremor   Assessment/Plan: 1. Functional deficits which require 3+ hours per day of interdisciplinary therapy in a comprehensive inpatient rehab setting. Physiatrist is providing close team supervision and 24 hour management of active medical problems listed below. Physiatrist and rehab team continue to assess barriers to discharge/monitor patient progress toward functional and medical goals  Care Tool:  Bathing    Body parts bathed by patient: Right arm, Left arm, Abdomen, Front perineal area, Buttocks, Right upper leg, Left upper leg, Right lower leg, Left lower leg, Face     Body parts n/a: Chest (sternal incision)   Bathing assist Assist Level: Contact Guard/Touching assist     Upper Body Dressing/Undressing Upper body dressing   What is the patient wearing?: Pull over shirt    Upper body assist Assist Level: Supervision/Verbal cueing    Lower Body Dressing/Undressing Lower body dressing      What is the patient wearing?: Pants, Underwear/pull up     Lower body assist Assist for lower body dressing: Contact Guard/Touching assist     Toileting Toileting    Toileting assist Assist for toileting: Contact Guard/Touching assist     Transfers Chair/bed transfer  Transfers assist     Chair/bed  transfer assist level: Contact Guard/Touching assist     Locomotion Ambulation   Ambulation assist      Assist level: Minimal Assistance - Patient > 75% Assistive device: No Device Max distance: 156ft   Walk 10 feet activity   Assist     Assist level: Minimal Assistance - Patient > 75% Assistive device: No Device   Walk 50 feet activity   Assist    Assist level: Minimal Assistance - Patient > 75% Assistive device: No Device    Walk 150 feet activity   Assist    Assist level: Minimal Assistance - Patient > 75% Assistive device: No Device    Walk 10 feet on uneven surface  activity   Assist     Assist level: Minimal Assistance - Patient  > 75%     Wheelchair     Assist Is the patient using a wheelchair?: Yes Type of Wheelchair: Manual    Wheelchair assist level: Dependent - Patient 0% Max wheelchair distance: >117ft    Wheelchair 50 feet with 2 turns activity    Assist        Assist Level: Dependent - Patient 0%   Wheelchair 150 feet activity     Assist      Assist Level: Dependent - Patient 0%   Blood pressure 135/69, pulse 98, temperature 99 F (37.2 C), temperature source Oral, resp. rate 18, height 5' 11 (1.803 m), weight 62.5 kg, SpO2 99%.  Medical Problem List and Plan: 1. Functional deficits secondary to debility due to severe aortic insufficiency s/p AVR. Pt with postoperative seizures and encephalopathy.  Sternal precautions.              -patient may shower, cover incisions please             -ELOS/Goals: 7 days, modI with PT/OT             -Chart and therapy notes reviewed, continue CIR, discussed patient's progress with therapy Grounds pass ordered Vitamin D3/Metanx/Vitamin B/C complex ordered F/u with me or Fidela in clinic within 1 month Would benefit from home health aide upon discharge   2.  Antithrombotics: -DVT/anticoagulation:  Mechanical: Sequential compression devices, below knee Bilateral lower extremities Pharmaceutical:  Heparin              -antiplatelet therapy: Asprin  3. Pain Management: Oxycodone  prn--Headache: Imitrex  prn  4. Insomnia: schedule trazodone 100mg  HS 5. Neuropsych/cognition: This patient is capable of making decisions on his own behalf. 6. Skin/Wound Care: Sternal Precautions- monitor chest tube sites and incisions for s/s of infection. 7. GERD: continue PPI  8. Acute on chronic diastolic HF: Lasix  40 mg daily.  Spironolactone  and Entresto  not resumed d/t creatinine.  Daily weights 9. AKI CKD IIIb: (Baseline 1.7-2) now 1.9. Monitor CMP  - Lasix  40 mg daily has been restarted 10. A. Fib: post op issue- now on Amiodarone 400 mg bid, Lopressor  50  mg bid and Amlodipine  10 mg. Monitor and supplement potassium as needed.  11. ABLA: post op acute anemia- H&H stable 10.2/31.1. Monitor CBC  12. T2DM: Hemoglobin A1c 7.7 11/12/23             -monitor CBGs ac/hs with SSI. Continue Jardiance   13. Hx of seizures: Keppra 500 mg bid  14. Thrombocytopenia- monitor platelets currently 104k  15. COPD: stable on RA.  Duo neb prn and incentive spirometer  16. Constipation: Colace and Miralax  daily  17. HLD: Crestor  40 mg  18. RLS: Requip  0.25 mg prn  19. Vitamin D insufficiency: start D3 daily      LOS: 2 days A FACE TO FACE EVALUATION WAS PERFORMED  Sven SQUIBB Juandedios Dudash 11/17/2023, 9:34 AM

## 2023-11-17 NOTE — IPOC Note (Signed)
 Overall Plan of Care [facility]) Patient Details Name: Michael Bender MRN: [id number] DOB: 28-Sep-1945  Admitting Diagnosis: Debility  Hospital Problems: Principal Problem:   Debility Active Problems:   GERD   CONSTIPATION, CHRONIC   Primary hypertension   Insomnia   Peripheral arterial disease   History of CVA (cerebrovascular accident)   Anemia   HLD (hyperlipidemia)   COPD (chronic obstructive pulmonary disease) (HCC)   Iron deficiency anemia   Chronic heart failure with preserved ejection fraction (HFpEF, >= 50%) (HCC)   Stage 3 chronic kidney disease (HCC)   Restless legs     Functional Problem List: Nursing Edema, Endurance, Safety, Skin Integrity, Bladder, Bowel  PT Balance, Edema, Endurance, Motor, Nutrition, Pain, Safety, Sensory, Skin Integrity  OT Balance, Behavior, Endurance, Safety  SLP    TR         Basic ADL's: OT Bathing, Dressing, Toileting     Advanced  ADL's: OT Simple Meal Preparation, Laundry     Transfers: PT Bed Mobility, Bed to Chair, Set designer, Occupational psychologist, Research scientist (life sciences): PT Ambulation, Psychologist, prison and probation services, Stairs     Additional Impairments: OT None  SLP        TR      Anticipated Outcomes Item Anticipated Outcome  Self Feeding Mod-I  Swallowing      Basic self-care  Mod-I  Toileting  Mod-I   Bathroom Transfers Mod-I  Bowel/Bladder  manage bowels with medications/ manage bladder with toileting assistance  Transfers  mod I with LRAD  Locomotion  mod I with LRAD  Communication     Cognition     Pain  <4 w/ prns  Safety/Judgment  manage safety mod I assistance   Therapy Plan: PT Intensity: Minimum of 1-2 x/day ,45 to 90 minutes PT Frequency: 5 out of 7 days PT Duration Estimated Length of Stay: 5-7 days OT Intensity: Minimum of 1-2 x/day, 45 to 90 minutes OT Frequency: 5 out of 7 days OT Duration/Estimated Length of Stay: 7 days     Team Interventions: Nursing Interventions Patient/Family  Education, Medication Management, Bladder Management, Bowel Management, Disease Management/Prevention, Pain Management, Discharge Planning, Skin Care/Wound Management  PT interventions Ambulation/gait training, Functional mobility training, Discharge planning, Psychosocial support, Therapeutic Activities, Balance/vestibular training, Disease management/prevention, Neuromuscular re-education, Skin care/wound management, Therapeutic Exercise, Wheelchair propulsion/positioning, DME/adaptive equipment instruction, Pain management, UE/LE Strength taining/ROM, Community reintegration, Equities trader education, Museum/gallery curator, UE/LE Coordination activities  OT Interventions Warden/ranger, Discharge planning, Pain management, Self Care/advanced ADL retraining, Therapeutic Activities, Therapeutic Exercise, Functional mobility training, Disease mangement/prevention, Patient/family education, DME/adaptive equipment instruction, UE/LE Strength taining/ROM, Community reintegration, Psychosocial support, Wheelchair propulsion/positioning  SLP Interventions    TR Interventions    SW/CM Interventions Discharge Planning, Psychosocial Support, Patient/Family Education, Disease Management/Prevention   Barriers to Discharge MD  Medical stability  Nursing Decreased caregiver support, Home environment access/layout Discharge Living Setting: Patient's home  Type of Home at Discharge: House  Discharge Home Layout: One level  Discharge Home Access: Level entry;Sons can check on him daily, needs mod I goals  PT Decreased caregiver support, Wound Care, Weight bearing restrictions, Other (comments) sternal precautions, lives alone  OT None    SLP      SW       Team Discharge Planning: Destination: PT-Home ,OT- Home , SLP-  Projected Follow-up: PT-Other (comment) (outpatient cardiac rehab), OT-  Outpatient OT, SLP-  Projected Equipment Needs: PT-To be determined, OT- Tub/shower seat, SLP-  Equipment  Details: PT-has RW and walking  sticks, OT-  Patient/family involved in discharge planning: PT- Patient, Family member/caregiver,  OT-Patient, SLP-   MD ELOS: 8 days Medical Rehab Prognosis:  Excellent Assessment: The patient has been admitted for CIR therapies with the diagnosis of cardiac debility. The team will be addressing functional mobility, strength, stamina, balance, safety, adaptive techniques and equipment, self-care, bowel and bladder mgt, patient and caregiver education. Goals have been set at modI. Anticipated discharge destination is home.        See Team Conference Notes for weekly updates to the plan of care

## 2023-11-18 DIAGNOSIS — G479 Sleep disorder, unspecified: Secondary | ICD-10-CM

## 2023-11-18 DIAGNOSIS — K5901 Slow transit constipation: Secondary | ICD-10-CM

## 2023-11-18 DIAGNOSIS — N179 Acute kidney failure, unspecified: Secondary | ICD-10-CM | POA: Diagnosis not present

## 2023-11-18 DIAGNOSIS — R5381 Other malaise: Secondary | ICD-10-CM | POA: Diagnosis not present

## 2023-11-18 LAB — GLUCOSE, CAPILLARY
Glucose-Capillary: 167 mg/dL — ABNORMAL HIGH (ref 70–99)
Glucose-Capillary: 201 mg/dL — ABNORMAL HIGH (ref 70–99)
Glucose-Capillary: 484 mg/dL — ABNORMAL HIGH (ref 70–99)
Glucose-Capillary: 420 mg/dL — ABNORMAL HIGH (ref 70–99)
Glucose-Capillary: 147 mg/dL — ABNORMAL HIGH (ref 70–99)

## 2023-11-18 MED ORDER — TRAZODONE HCL 50 MG PO TABS
50.0000 mg | ORAL_TABLET | Freq: Every evening | ORAL | Status: DC | PRN
  Administered 2023-11-18 – 2023-11-19 (×2): 50 mg via ORAL
  Filled 2023-11-18 (×2): qty 1

## 2023-11-18 MED ORDER — SENNOSIDES-DOCUSATE SODIUM 8.6-50 MG PO TABS
2.0000 | ORAL_TABLET | Freq: Every day | ORAL | Status: DC
  Administered 2023-11-18 – 2023-11-20 (×3): 2 via ORAL
  Filled 2023-11-18 (×3): qty 2

## 2023-11-18 MED ORDER — INSULIN ASPART 100 UNIT/ML IJ SOLN
7.0000 [IU] | Freq: Once | INTRAMUSCULAR | Status: AC
  Administered 2023-11-18: 7 [IU] via SUBCUTANEOUS

## 2023-11-18 MED ORDER — POLYETHYLENE GLYCOL 3350 17 G PO PACK
17.0000 g | PACK | Freq: Two times a day (BID) | ORAL | Status: DC
  Administered 2023-11-18 – 2023-11-21 (×6): 17 g via ORAL
  Filled 2023-11-18 (×6): qty 1

## 2023-11-18 MED ORDER — QUETIAPINE 12.5 MG HALF TABLET
12.0000 mg | ORAL_TABLET | Freq: Every day | ORAL | Status: DC
  Administered 2023-11-18: 12.5 mg via ORAL
  Filled 2023-11-18: qty 1

## 2023-11-18 NOTE — Plan of Care (Signed)
  Problem: Consults Goal: RH GENERAL PATIENT EDUCATION Description: See Patient Education module for education specifics. Outcome: Progressing   Problem: RH BOWEL ELIMINATION Goal: RH STG MANAGE BOWEL WITH ASSISTANCE Description: STG Manage Bowel with mod I Assistance. Outcome: Progressing   Problem: RH BLADDER ELIMINATION Goal: RH STG MANAGE BLADDER WITH ASSISTANCE Description: STG Manage Bladder With mod I Assistance Outcome: Progressing   Problem: RH SKIN INTEGRITY Goal: RH STG SKIN FREE OF INFECTION/BREAKDOWN Description: Manage skin free of infection with mod I assistance  Outcome: Progressing   Problem: RH SAFETY Goal: RH STG ADHERE TO SAFETY PRECAUTIONS W/ASSISTANCE/DEVICE Description: STG Adhere to Safety Precautions With mod I Assistance/Device. Outcome: Progressing   Problem: RH PAIN MANAGEMENT Goal: RH STG PAIN MANAGED AT OR BELOW PT'S PAIN GOAL Description: <4 w/ prns Outcome: Progressing   Problem: RH KNOWLEDGE DEFICIT GENERAL Goal: RH STG INCREASE KNOWLEDGE OF SELF CARE AFTER HOSPITALIZATION Description: Manage increase knowledge of self care after hospitalization with mod I assistance from sons using educational materials provided Outcome: Progressing   Problem: Education: Goal: Ability to describe self-care measures that may prevent or decrease complications (Diabetes Survival Skills Education) will improve Outcome: Progressing Goal: Individualized Educational Video(s) Outcome: Progressing   Problem: Coping: Goal: Ability to adjust to condition or change in health will improve Outcome: Progressing   Problem: Fluid Volume: Goal: Ability to maintain a balanced intake and output will improve Outcome: Progressing   Problem: Health Behavior/Discharge Planning: Goal: Ability to identify and utilize available resources and services will improve Outcome: Progressing Goal: Ability to manage health-related needs will improve Outcome: Progressing   Problem:  Metabolic: Goal: Ability to maintain appropriate glucose levels will improve Outcome: Progressing   Problem: Nutritional: Goal: Maintenance of adequate nutrition will improve Outcome: Progressing Goal: Progress toward achieving an optimal weight will improve Outcome: Progressing   Problem: Skin Integrity: Goal: Risk for impaired skin integrity will decrease Outcome: Progressing   Problem: Tissue Perfusion: Goal: Adequacy of tissue perfusion will improve Outcome: Progressing

## 2023-11-18 NOTE — Progress Notes (Signed)
 PROGRESS NOTE   Subjective/Complaints: Pt not sleeping well. Med is not working. Didn't fall asleep until 0600.   ROS: Patient denies fever, rash, sore throat, blurred vision, dizziness, nausea, vomiting, diarrhea, cough, shortness of breath or chest pain, joint or back/neck pain, headache, or mood change.    Objective:   No results found. Recent Labs    11/16/23 0542  WBC 6.5  HGB 11.6*  HCT 36.1*  PLT 164   Recent Labs    11/16/23 0542  NA 139  K 3.5  CL 105  CO2 22  GLUCOSE 158*  BUN 31*  CREATININE 1.58*  CALCIUM  8.2*    Intake/Output Summary (Last 24 hours) at 11/18/2023 1025 Last data filed at 11/18/2023 0745 Gross per 24 hour  Intake 360 ml  Output --  Net 360 ml        Physical Exam: Vital Signs Blood pressure (!) 152/68, pulse 71, temperature 98.4 F (36.9 C), resp. rate 17, height 5' 11 (1.803 m), weight 60.2 kg, SpO2 99%. Constitutional: No distress . Vital signs reviewed. HEENT: NCAT, EOMI, oral membranes moist Neck: supple Cardiovascular: RRR without murmur. No JVD    Respiratory/Chest: CTA Bilaterally without wheezes or rales. Normal effort    GI/Abdomen: BS +, non-tender, non-distended Ext: no clubbing, cyanosis, or edema Psych: pleasant and cooperative  Skin: sternal and abdominal incisions CDI, bruising in UEs  Neuro:     Mental Status: AAOx4, memory intact, Good insight and awareness Speech/Languate: Fluent, follows simple commands CRANIAL NERVES: II: PERRL. Visual fields full III, IV, VI: EOM intact, no gaze preference or deviation V: normal sensation bilaterally VII: no asymmetry VIII: normal hearing to speech IX, X: normal palatal elevation XI: 5/5 head turn and 5/5 shoulder shrug bilaterally XII: Tongue midline     MOTOR: RUE: at least 4/5 LUE:at least 4/5  RLE: HF 5/5, KE 5/5, ADF 5/5, APF 5/5 LLE: HF 5/5, KE 5/5, ADF 5/5, APF 5/5 Sternal precautions limits UE  exam   REFLEXES: hyporeflexive b/l LE   SENSORY: Normal to touch all 4 extremities   Coordination: Normal finger to nose b/l, no tremor Prior neuro assessment is c/w 11/18/2023 exam.   Assessment/Plan: 1. Functional deficits which require 3+ hours per day of interdisciplinary therapy in a comprehensive inpatient rehab setting. Physiatrist is providing close team supervision and 24 hour management of active medical problems listed below. Physiatrist and rehab team continue to assess barriers to discharge/monitor patient progress toward functional and medical goals  Care Tool:  Bathing    Body parts bathed by patient: Right arm, Left arm, Abdomen, Front perineal area, Buttocks, Right upper leg, Left upper leg, Right lower leg, Left lower leg, Face     Body parts n/a: Chest (sternal incision)   Bathing assist Assist Level: Contact Guard/Touching assist     Upper Body Dressing/Undressing Upper body dressing   What is the patient wearing?: Pull over shirt    Upper body assist Assist Level: Supervision/Verbal cueing    Lower Body Dressing/Undressing Lower body dressing      What is the patient wearing?: Pants, Underwear/pull up     Lower body assist Assist for lower body dressing: Contact  Guard/Touching assist     Editor, commissioning assist Assist for toileting: Contact Guard/Touching assist     Transfers Chair/bed transfer  Transfers assist     Chair/bed transfer assist level: Contact Guard/Touching assist     Locomotion Ambulation   Ambulation assist      Assist level: Contact Guard/Touching assist Assistive device: Other (comment) (walking sticks) Max distance: 442ft   Walk 10 feet activity   Assist     Assist level: Contact Guard/Touching assist Assistive device: Other (comment) (walking sticks)   Walk 50 feet activity   Assist    Assist level: Contact Guard/Touching assist Assistive device: Other (comment) (walking  sticks)    Walk 150 feet activity   Assist    Assist level: Contact Guard/Touching assist Assistive device: Other (comment) (walking sticks)    Walk 10 feet on uneven surface  activity   Assist     Assist level: Minimal Assistance - Patient > 75%     Wheelchair     Assist Is the patient using a wheelchair?: Yes Type of Wheelchair: Manual    Wheelchair assist level: Dependent - Patient 0% Max wheelchair distance: >179ft    Wheelchair 50 feet with 2 turns activity    Assist        Assist Level: Dependent - Patient 0%   Wheelchair 150 feet activity     Assist      Assist Level: Dependent - Patient 0%   Blood pressure (!) 152/68, pulse 71, temperature 98.4 F (36.9 C), resp. rate 17, height 5' 11 (1.803 m), weight 60.2 kg, SpO2 99%.  Medical Problem List and Plan: 1. Functional deficits secondary to debility due to severe aortic insufficiency s/p AVR. Pt with postoperative seizures and encephalopathy.  Sternal precautions.              -patient may shower, cover incisions please             -ELOS/Goals: 7 days, modI with PT/OT              Grounds pass ordered Vitamin D3/Metanx/Vitamin B/C complex ordered F/u with me or Fidela in clinic within 1 month Would benefit from home health aide upon discharge   -Continue CIR therapies including PT, OT  2.  Antithrombotics: -DVT/anticoagulation:  Mechanical: Sequential compression devices, below knee Bilateral lower extremities Pharmaceutical:  Heparin              -antiplatelet therapy: Asprin  3. Pain Management: Oxycodone  prn--Headache: Imitrex  prn  4. Insomnia: trazodone ineffective  -begin trial seroquel  12mg  at 9pm 5. Neuropsych/cognition: This patient is capable of making decisions on his own behalf. 6. Skin/Wound Care: Sternal Precautions- monitor chest tube sites and incisions for s/s of infection. 7. GERD: continue PPI  8. Acute on chronic diastolic HF: Lasix  40 mg daily.  Spironolactone   and Entresto  not resumed d/t creatinine.  Daily weights 9. AKI CKD IIIb: (Baseline 1.7-2) now 1.9. Monitor CMP  - Lasix  40 mg daily has been restarted 10. A. Fib: post op issue- now on Amiodarone 400 mg bid, Lopressor  50 mg bid and Amlodipine  10 mg. Monitor and supplement potassium as needed.  11. ABLA: post op acute anemia- H&H stable 10.2/31.1. Monitor CBC  12. T2DM: Hemoglobin A1c 7.7 11/12/23             -monitor CBGs ac/hs with SSI. Continue Jardiance   13. Hx of seizures: Keppra 500 mg bid  14. Thrombocytopenia- monitor platelets currently 104k  15. COPD: stable  on RA.  Duo neb prn and incentive spirometer  16. Constipation: Colace and Miralax  daily   -last BM 10/15  -change colace to senna-s 2 at bedtime 17. HLD: Crestor  40 mg  18. RLS: Requip  0.25 mg prn    19. Vitamin D insufficiency: start D3 daily      LOS: 3 days A FACE TO FACE EVALUATION WAS PERFORMED  Michael Bender 11/18/2023, 10:25 AM

## 2023-11-18 NOTE — Progress Notes (Signed)
 Physical Therapy Session Note  Patient Details  Name: Michael Bender MRN: [id number] Date of Birth: 07/07/1945  Today's Date: 11/18/2023 PT Individual Time: 0715-0755 and [id number] PT Individual Time Calculation (min): 40 min and 39 min  Short Term Goals: Week 1:  PT Short Term Goal 1 (Week 1): STG=LTG due to LOS  Skilled Therapeutic Interventions/Progress Updates:   Treatment Session 1 Received pt semi-reclined in bed, pt agreeable to PT treatment, and denied any pain during session. Pt reported getting a little more sleep last night (1hr) but was woken up by staff to take vitals - discussed with RN minimizing interruptions. Session with emphasis on functional mobility/transfers, generalized strengthening and endurance, dynamic standing balance/coordination, NMR, and ambulation. Pt demonstrated good adherence to sternal precautions throughout session. Pt performed bed mobility with HOB elevated and mod I and donned shoes with setup assist.   Pt performed all transfers without AD and CGA/close supervision throughout session. Pt sat in WC at sink and brushed teeth mod I while RN administered medication. Located sticks to simulate pt's walking sticks at home until his son can bring his. Pt then ambulated 153ft x 2 trials with 2 walking sticks and CGA to main therapy gym. Pt reports one of his goals is to walk his dog again  - although he walks his dog off leash and only goes .25 miles.   Pt performed 6 Min Walk Test (see below):  Instructed patient to ambulate as quickly and as safely as possible for 6 minutes using LRAD. Patient was allowed to take standing rest breaks without stopping the test, but if the patient required a sitting rest break the clock would be stopped and the test would be over.  Results: 497 feet (151 meters, Avg speed 0.14m/s) using walking sticks with CGA/very close supervision. Results indicate that the patient has reduced endurance with ambulation compared to age  matched norms.  Age Matched Norms: 36-69 yo M: 56 F: 53, 33-79 yo M: 95 F: 471, 52-89 yo M: 417 F: 392 MDC: 58.21 meters (190.98 feet) or 50 meters (ANPTA Core Set of Outcome Measures for Adults with Neurologic Conditions, 2018)  Pt very fatigued with activity (suspect due to minimal sleep) and politely declined any higher level standing/strengthening. Discussed breaking up 75 minute session to allow pt to rest in between and pt in agreement and appreciative. Returned to room and transferred into supine mod I. Concluded session with pt semi-reclined in bed, needs within reach, and bed alarm on. Turned off lights to allow pt to try to sleep before next therapy session.   Treatment Session 2 Received pt semi-reclined in bed, pt agreeable to PT treatment, and denied any pain during session just fatigue. Session with emphasis on functional mobility/transfers, generalized strengthening and endurance, dynamic standing balance/coordination, NMR, and ambulation. Pt performed bed mobility x 2 trials with mod I and donned shoes with setup assist. Pt performed all transfers without AD and CGA/close supervision throughout session. Pt transported to/from room in [facility] dependently for energy conservation purposes.   Pt ambulated 112ft with walking sticks and close supervision/CGA, then performed seated BLE strengthening on Nustep at workload 4 for 8 minutes with emphasis on cardiovascular endurance - total of 378 steps, 0.2 miles, 46 SPM, and 1.6 METs.  Pt then worked on dynamic standing balance in agility ladder x 2 trials performing alternating high knee marches using walking sticks with CGA for balance and cues for technique. Returned to room and requested to return to bed. Concluded session  with pt semi-reclined in bed, needs within reach, and bed alarm on.   Therapy Documentation Precautions:  Precautions Precautions: Fall, Sternal Precaution Booklet Issued: Yes (comment) Recall of Precautions/Restrictions:  Impaired Restrictions Weight Bearing Restrictions Per Provider Order: No Other Position/Activity Restrictions: Sternal  Therapy/Group: Individual Therapy Therisa HERO Zaunegger Therisa Stains PT, DPT 11/18/2023, 6:51 AM

## 2023-11-18 NOTE — Progress Notes (Signed)
 Occupational Therapy Session Note  Patient Details  Name: Michael Bender MRN: [id number] Date of Birth: 26-Jan-1946  Today's Date: 11/18/2023 OT Individual Time: 1015-1110 OT Individual Time Calculation (min): 55 min    Short Term Goals: Week 1:  OT Short Term Goal 1 (Week 1): LTG=STG d/t ELOS  Skilled Therapeutic Interventions/Progress Updates:  Skilled OT session completed to address ADL retraining and personal grooming. Pt received supine in bed, agreeable to participate in therapy. Pt reports no pain; however, continues to report no sleep at night. At end of session, OT fosters environment for sleeping by dimming lights and assisting with positioning in bed. All transfers performed this session ambulatory without AD, CGA.   Shower ADL session covering incisions. Pt attempted to A with covering incision; however, displaying difficulty creating seal. OT provided education on showering within the home by covering incisions with tape and grocery bag. Pt verbalized understanding and reports son will A d/t difficulty managing seal. Pt ambulates to shower, seated at TTB. Pt completes shower with CGA d/t standing intermittently during task with grab bars. Pt completes dressing seated at TTB, Touch A to adjust socks for grip facing down. Pt ambulates to [facility] and performs personal grooming with Ind seated at sink. Pt returns to bed, with bed alarm on and all needs within reach. Pt displayed good adherence to sternal precautions throughout session when performing STS.  Therapy Documentation Precautions:  Precautions Precautions: Fall, Sternal Precaution Booklet Issued: Yes (comment) Recall of Precautions/Restrictions: Impaired Restrictions Weight Bearing Restrictions Per Provider Order: No Other Position/Activity Restrictions: Sternal   Therapy/Group: Individual Therapy  Makelle Marrone Woods-Chance, MS, OTR/L 11/18/2023, 8:00 AM

## 2023-11-18 NOTE — Plan of Care (Signed)
  Problem: Consults Goal: RH GENERAL PATIENT EDUCATION Description: See Patient Education module for education specifics. Outcome: Progressing   Problem: RH BOWEL ELIMINATION Goal: RH STG MANAGE BOWEL WITH ASSISTANCE Description: STG Manage Bowel with mod I Assistance. Outcome: Progressing   Problem: RH BLADDER ELIMINATION Goal: RH STG MANAGE BLADDER WITH ASSISTANCE Description: STG Manage Bladder With mod I Assistance Outcome: Progressing

## 2023-11-18 NOTE — Progress Notes (Signed)
 Occupational Therapy Session Note  Patient Details  Name: Michael Bender MRN: [id number] Date of Birth: 1945-07-13  Today's Date: 11/18/2023 OT Individual Time: 1410-1455 OT Individual Time Calculation (min): 45 min  and Today's Date: 11/18/2023 OT Missed Time: 15 Minutes Missed Time Reason: Other (comment) (pt requesting to finish lunch first)   Short Term Goals: Week 1:  OT Short Term Goal 1 (Week 1): LTG=STG d/t ELOS   Skilled Therapeutic Interventions/Progress Updates:    Pt greeted at time of session sitting up at EOB finishing eating his lunch, requesting time to finish before OT session. OT rearranging and seeing the pt later but missed 15 mins 2/2 request to eat lunch first. Pt performing all transfers this date with no AD or HHA bed <> wheelchair. Transported outside for fresh air and uplifting mood. Discussion with pt for DC planning home Indep with sons to assist with IADLs like grocery shopping, reviewed some techniques for home management to improve work simplification. Pt performing the following with 1.5 ankle weights: seated hip flexion, knee extension, taps/archs, and in standing mini squats all for 1x15-20 as appropriate. Pt ambulating for functional mobility training with HHA throughout courtyard x3 rounds. Back in room set up bed level call bell in reach alarm on all needs met.   Therapy Documentation Precautions:  Precautions Precautions: Fall, Sternal Precaution Booklet Issued: Yes (comment) Recall of Precautions/Restrictions: Impaired Restrictions Weight Bearing Restrictions Per Provider Order: No Other Position/Activity Restrictions: Sternal    Therapy/Group: Individual Therapy  [name] 11/18/2023, 7:32 AM

## 2023-11-19 DIAGNOSIS — N179 Acute kidney failure, unspecified: Secondary | ICD-10-CM | POA: Diagnosis not present

## 2023-11-19 DIAGNOSIS — R5381 Other malaise: Secondary | ICD-10-CM | POA: Diagnosis not present

## 2023-11-19 DIAGNOSIS — K5901 Slow transit constipation: Secondary | ICD-10-CM | POA: Diagnosis not present

## 2023-11-19 DIAGNOSIS — G479 Sleep disorder, unspecified: Secondary | ICD-10-CM | POA: Diagnosis not present

## 2023-11-19 LAB — GLUCOSE, CAPILLARY
Glucose-Capillary: 199 mg/dL — ABNORMAL HIGH (ref 70–99)
Glucose-Capillary: 300 mg/dL — ABNORMAL HIGH (ref 70–99)
Glucose-Capillary: 267 mg/dL — ABNORMAL HIGH (ref 70–99)
Glucose-Capillary: 211 mg/dL — ABNORMAL HIGH (ref 70–99)
Glucose-Capillary: 267 mg/dL — ABNORMAL HIGH (ref 70–99)

## 2023-11-19 MED ORDER — FUROSEMIDE 20 MG PO TABS
20.0000 mg | ORAL_TABLET | Freq: Once | ORAL | Status: AC
  Administered 2023-11-19: 20 mg via ORAL
  Filled 2023-11-19: qty 1

## 2023-11-19 MED ORDER — QUETIAPINE FUMARATE 25 MG PO TABS
25.0000 mg | ORAL_TABLET | Freq: Every day | ORAL | Status: DC
  Administered 2023-11-19 – 2023-11-20 (×2): 25 mg via ORAL
  Filled 2023-11-19 (×2): qty 1

## 2023-11-19 NOTE — Progress Notes (Signed)
 Nurse called me earlier about pt being a little short of breath walking back from bathroom. VSS. O2 sats 98% on RA.  Filed Weights   11/17/23 1800 11/18/23 0506 11/19/23 0635  Weight: 60.2 kg 60.2 kg 63 kg    Weights are up slightly over last few days.   EKG reviewed and without acute changes. QTc about where it's been over the last 3 EKG's  Will go ahead and give him an extra 20mg  lasix  today.   Recheck weight, labs in AM.  Continue seroquel  25mg  at bedtime for sleep for now.   Arthea IVAR Gunther, MD, [facility] Physical Medicine & Rehabilitation Medical Director Rehabilitation Services 11/19/2023

## 2023-11-19 NOTE — Plan of Care (Signed)
  Problem: Consults Goal: RH GENERAL PATIENT EDUCATION Description: See Patient Education module for education specifics. Outcome: Progressing   Problem: RH SKIN INTEGRITY Goal: RH STG SKIN FREE OF INFECTION/BREAKDOWN Description: Manage skin free of infection with mod I assistance  Outcome: Progressing   Problem: RH SAFETY Goal: RH STG ADHERE TO SAFETY PRECAUTIONS W/ASSISTANCE/DEVICE Description: STG Adhere to Safety Precautions With mod I Assistance/Device. Outcome: Progressing

## 2023-11-19 NOTE — Progress Notes (Signed)
 PROGRESS NOTE   Subjective/Complaints: May have slept a little better with seroquel , but no much. Moving well in therapy. Pleased with progress  ROS: Patient denies fever, rash, sore throat, blurred vision, dizziness, nausea, vomiting, diarrhea, cough, shortness of breath or chest pain, joint or back/neck pain, headache, or mood change.    Objective:   No results found. No results for input(s): WBC, HGB, HCT, PLT in the last 72 hours.  No results for input(s): NA, K, CL, CO2, GLUCOSE, BUN, CREATININE, CALCIUM  in the last 72 hours.   Intake/Output Summary (Last 24 hours) at 11/19/2023 0855 Last data filed at 11/19/2023 0754 Gross per 24 hour  Intake 476 ml  Output --  Net 476 ml        Physical Exam: Vital Signs Blood pressure (!) 157/68, pulse 77, temperature 98.1 F (36.7 C), temperature source Oral, resp. rate 18, height 5' 11 (1.803 m), weight 63 kg, SpO2 94%. Constitutional: No distress . Vital signs reviewed. HEENT: NCAT, EOMI, oral membranes moist Neck: supple Cardiovascular: RRR without murmur. No JVD    Respiratory/Chest: CTA Bilaterally without wheezes or rales. Normal effort    GI/Abdomen: BS +, non-tender, non-distended Ext: no clubbing, cyanosis, or edema Psych: pleasant and cooperative  Skin: sternal and abdominal incisions CDI, bruising in UEs  Neuro:     Mental Status: AAOx4, memory intact, Good insight and awareness Speech/Languate: Fluent, follows simple commands CRANIAL NERVES: II: PERRL. Visual fields full III, IV, VI: EOM intact, no gaze preference or deviation V: normal sensation bilaterally VII: no asymmetry VIII: normal hearing to speech IX, X: normal palatal elevation XI: 5/5 head turn and 5/5 shoulder shrug bilaterally XII: Tongue midline     MOTOR: RUE: at least 4/5 LUE:at least 4/5  RLE: HF 5/5, KE 5/5, ADF 5/5, APF 5/5 LLE: HF 5/5, KE 5/5, ADF 5/5,  APF 5/5 Sternal precautions limits UE exam   REFLEXES: hyporeflexive b/l LE   SENSORY: Normal to touch all 4 extremities   Coordination: Normal finger to nose b/l, no tremor Prior neuro assessment is c/w 11/19/2023 exam.   Assessment/Plan: 1. Functional deficits which require 3+ hours per day of interdisciplinary therapy in a comprehensive inpatient rehab setting. Physiatrist is providing close team supervision and 24 hour management of active medical problems listed below. Physiatrist and rehab team continue to assess barriers to discharge/monitor patient progress toward functional and medical goals  Care Tool:  Bathing    Body parts bathed by patient: Right arm, Left arm, Abdomen, Front perineal area, Buttocks, Right upper leg, Left upper leg, Right lower leg, Left lower leg, Face     Body parts n/a: Chest (sternal incision)   Bathing assist Assist Level: Contact Guard/Touching assist     Upper Body Dressing/Undressing Upper body dressing   What is the patient wearing?: Pull over shirt    Upper body assist Assist Level: Supervision/Verbal cueing    Lower Body Dressing/Undressing Lower body dressing      What is the patient wearing?: Pants, Underwear/pull up     Lower body assist Assist for lower body dressing: Contact Guard/Touching assist     Toileting Toileting    Toileting assist Assist for  toileting: Contact Guard/Touching assist     Transfers Chair/bed transfer  Transfers assist     Chair/bed transfer assist level: Contact Guard/Touching assist     Locomotion Ambulation   Ambulation assist      Assist level: Contact Guard/Touching assist Assistive device: Other (comment) (walking sticks) Max distance: 466ft   Walk 10 feet activity   Assist     Assist level: Contact Guard/Touching assist Assistive device: Other (comment) (walking sticks)   Walk 50 feet activity   Assist    Assist level: Contact Guard/Touching assist Assistive  device: Other (comment) (walking sticks)    Walk 150 feet activity   Assist    Assist level: Contact Guard/Touching assist Assistive device: Other (comment) (walking sticks)    Walk 10 feet on uneven surface  activity   Assist     Assist level: Minimal Assistance - Patient > 75%     Wheelchair     Assist Is the patient using a wheelchair?: Yes Type of Wheelchair: Manual    Wheelchair assist level: Dependent - Patient 0% Max wheelchair distance: >150ft    Wheelchair 50 feet with 2 turns activity    Assist        Assist Level: Dependent - Patient 0%   Wheelchair 150 feet activity     Assist      Assist Level: Dependent - Patient 0%   Blood pressure (!) 157/68, pulse 77, temperature 98.1 F (36.7 C), temperature source Oral, resp. rate 18, height 5' 11 (1.803 m), weight 63 kg, SpO2 94%.  Medical Problem List and Plan: 1. Functional deficits secondary to debility due to severe aortic insufficiency s/p AVR. Pt with postoperative seizures and encephalopathy.  Sternal precautions.              -patient may shower, cover incisions please             -ELOS/Goals: 7 days, modI with PT/OT              Grounds pass ordered Vitamin D3/Metanx/Vitamin B/C complex ordered F/u with me or Fidela in clinic within 1 month Would benefit from home health aide upon discharge   -Continue CIR therapies including PT, OT  2.  Antithrombotics: -DVT/anticoagulation:  Mechanical: Sequential compression devices, below knee Bilateral lower extremities Pharmaceutical:  Heparin              -antiplatelet therapy: Asprin  3. Pain Management: Oxycodone  prn--Headache: Imitrex  prn  4. Insomnia: melatonin, trazodone ineffective  10/19 seroquel  helped a bit, increase seroquel  to 25mg  at 2100 5. Neuropsych/cognition: This patient is capable of making decisions on his own behalf. 6. Skin/Wound Care: Sternal Precautions- monitor chest tube sites and incisions for s/s of  infection. 7. GERD: continue PPI  8. Acute on chronic diastolic HF: Lasix  40 mg daily.  Spironolactone  and Entresto  not resumed d/t creatinine.  Daily weights 9. AKI CKD IIIb: (Baseline 1.7-2) now 1.9. Monitor CMP  - Lasix  40 mg daily has been restarted 10. A. Fib: post op issue- now on Amiodarone 400 mg bid, Lopressor  50 mg bid and Amlodipine  10 mg. Monitor and supplement potassium as needed.  11. ABLA: post op acute anemia- H&H stable 10.2/31.1. Monitor CBC  12. T2DM: Hemoglobin A1c 7.7 11/12/23             -monitor CBGs ac/hs with SSI. Continue Jardiance   13. Hx of seizures: Keppra 500 mg bid  14. Thrombocytopenia- monitor platelets currently 104k  15. COPD: stable on RA.  Duo neb prn  and incentive spirometer  16. Constipation: Colace and Miralax  daily   -last BM 10/15  -changed colace to senna-s 2 at bedtime  -10/19 -Had bm today 17. HLD: Crestor  40 mg  18. RLS: Requip  0.25 mg prn    19. Vitamin D insufficiency: start D3 daily      LOS: 4 days A FACE TO FACE EVALUATION WAS PERFORMED  Michael Bender 11/19/2023, 8:55 AM

## 2023-11-20 ENCOUNTER — Telehealth (HOSPITAL_COMMUNITY): Payer: Self-pay

## 2023-11-20 ENCOUNTER — Other Ambulatory Visit (HOSPITAL_COMMUNITY): Payer: Self-pay

## 2023-11-20 ENCOUNTER — Other Ambulatory Visit (HOSPITAL_COMMUNITY): Payer: Self-pay | Admitting: *Deleted

## 2023-11-20 DIAGNOSIS — R5381 Other malaise: Secondary | ICD-10-CM | POA: Diagnosis not present

## 2023-11-20 DIAGNOSIS — Z952 Presence of prosthetic heart valve: Secondary | ICD-10-CM

## 2023-11-20 LAB — BASIC METABOLIC PANEL WITH GFR
Anion gap: 10 (ref 5–15)
BUN: 34 mg/dL — ABNORMAL HIGH (ref 8–23)
CO2: 23 mmol/L (ref 22–32)
Calcium: 8.3 mg/dL — ABNORMAL LOW (ref 8.9–10.3)
Chloride: 104 mmol/L (ref 98–111)
Creatinine, Ser: 1.8 mg/dL — ABNORMAL HIGH (ref 0.61–1.24)
GFR, Estimated: 38 mL/min — ABNORMAL LOW (ref >60)
Glucose, Bld: 170 mg/dL — ABNORMAL HIGH (ref 70–99)
Potassium: 3.8 mmol/L (ref 3.5–5.1)
Sodium: 137 mmol/L (ref 135–145)

## 2023-11-20 LAB — CBC
HCT: 31.2 % — ABNORMAL LOW (ref 39.0–52.0)
Hemoglobin: 10.5 g/dL — ABNORMAL LOW (ref 13.0–17.0)
MCH: 30.5 pg (ref 26.0–34.0)
MCHC: 33.7 g/dL (ref 30.0–36.0)
MCV: 90.7 fL (ref 80.0–100.0)
Platelets: 376 K/uL (ref 150–400)
RBC: 3.44 MIL/uL — ABNORMAL LOW (ref 4.22–5.81)
RDW: 16.7 % — ABNORMAL HIGH (ref 11.5–15.5)
WBC: 9 K/uL (ref 4.0–10.5)
nRBC: 0 % (ref 0.0–0.2)

## 2023-11-20 LAB — GLUCOSE, CAPILLARY
Glucose-Capillary: 161 mg/dL — ABNORMAL HIGH (ref 70–99)
Glucose-Capillary: 229 mg/dL — ABNORMAL HIGH (ref 70–99)
Glucose-Capillary: 167 mg/dL — ABNORMAL HIGH (ref 70–99)
Glucose-Capillary: 302 mg/dL — ABNORMAL HIGH (ref 70–99)

## 2023-11-20 MED ORDER — TRAZODONE HCL 50 MG PO TABS
100.0000 mg | ORAL_TABLET | Freq: Every day | ORAL | Status: DC
  Administered 2023-11-20: 100 mg via ORAL
  Filled 2023-11-20: qty 2

## 2023-11-20 MED ORDER — ASPIRIN 81 MG PO TBEC
81.0000 mg | DELAYED_RELEASE_TABLET | Freq: Every day | ORAL | 12 refills | Status: DC
  Filled 2023-11-20: qty 30, 30d supply, fill #0

## 2023-11-20 MED ORDER — TRAZODONE HCL 100 MG PO TABS
100.0000 mg | ORAL_TABLET | Freq: Every day | ORAL | 0 refills | Status: DC
  Filled 2023-11-20: qty 15, 15d supply, fill #0

## 2023-11-20 MED ORDER — MAGNESIUM GLUCONATE 500 (27 MG) MG PO TABS
250.0000 mg | ORAL_TABLET | Freq: Every day | ORAL | 0 refills | Status: AC
  Filled 2023-11-20: qty 30, 60d supply, fill #0

## 2023-11-20 MED ORDER — FUROSEMIDE 40 MG PO TABS
40.0000 mg | ORAL_TABLET | Freq: Every day | ORAL | 0 refills | Status: DC
  Filled 2023-11-20: qty 30, 30d supply, fill #0

## 2023-11-20 MED ORDER — VITAMIN D3 25 MCG PO TABS
3000.0000 [IU] | ORAL_TABLET | Freq: Every day | ORAL | 0 refills | Status: DC
  Filled 2023-11-20: qty 90, 30d supply, fill #0

## 2023-11-20 MED ORDER — POTASSIUM CHLORIDE 20 MEQ PO PACK
20.0000 meq | PACK | Freq: Once | ORAL | Status: AC
  Administered 2023-11-20: 20 meq via ORAL
  Filled 2023-11-20: qty 1

## 2023-11-20 MED ORDER — AMIODARONE HCL 200 MG PO TABS
200.0000 mg | ORAL_TABLET | Freq: Two times a day (BID) | ORAL | 0 refills | Status: AC
Start: 2023-11-20 — End: ?
  Filled 2023-11-20: qty 60, 30d supply, fill #0

## 2023-11-20 MED ORDER — B COMPLEX-C PO TABS
1.0000 | ORAL_TABLET | Freq: Every day | ORAL | 0 refills | Status: AC
  Filled 2023-11-20: qty 100, 100d supply, fill #0

## 2023-11-20 MED ORDER — AMLODIPINE BESYLATE 10 MG PO TABS
10.0000 mg | ORAL_TABLET | Freq: Every day | ORAL | 0 refills | Status: DC
  Filled 2023-11-20: qty 30, 30d supply, fill #0

## 2023-11-20 MED ORDER — EMPAGLIFLOZIN 10 MG PO TABS
10.0000 mg | ORAL_TABLET | Freq: Every day | ORAL | 0 refills | Status: DC
  Filled 2023-11-20: qty 30, 30d supply, fill #0

## 2023-11-20 MED ORDER — VITAMIN D 25 MCG (1000 UNIT) PO TABS
3000.0000 [IU] | ORAL_TABLET | Freq: Every day | ORAL | Status: DC
  Administered 2023-11-21: 3000 [IU] via ORAL
  Filled 2023-11-20: qty 3

## 2023-11-20 NOTE — Progress Notes (Signed)
 Patient referral placed to [facility] cardiac rehabilitation

## 2023-11-20 NOTE — Progress Notes (Signed)
 Physical Therapy Discharge Summary  Patient Details  Name: Michael Bender MRN: [id number] Date of Birth: 08/27/1945  Date of Discharge from PT service:November 20, 2023  Patient has met 7 of 7 long term goals due to improved activity tolerance, improved balance, improved postural control, increased strength, ability to compensate for deficits, improved awareness, and improved coordination.  Patient to discharge at an ambulatory level Mod I using walking stick. Patient's care partner is independent to provide the necessary physical assistance at discharge.  All goals met  Recommendation:  Patient will benefit from ongoing skilled PT services in outpatient setting to continue to advance safe functional mobility, address ongoing impairments in transfers, generalized strengthening and endurance, dynamic standing balance/coordination, ambulation, and to minimize fall risk.  Equipment: No equipment provided - pt has personal walking stick  Reasons for discharge: treatment goals met and discharge from hospital  Patient/family agrees with progress made and goals achieved: Yes  PT Discharge Precautions/Restrictions Precautions Precautions: Fall;Sternal Recall of Precautions/Restrictions: Intact Restrictions Weight Bearing Restrictions Per Provider Order: No Pain Interference Pain Interference Pain Effect on Sleep: 1. Rarely or not at all Pain Interference with Therapy Activities: 1. Rarely or not at all Pain Interference with Day-to-Day Activities: 1. Rarely or not at all Cognition Overall Cognitive Status: Within Functional Limits for tasks assessed Arousal/Alertness: Awake/alert Orientation Level: Oriented X4 Memory: Appears intact Awareness: Appears intact Problem Solving: Appears intact Safety/Judgment: Appears intact Sensation Sensation Light Touch: Appears Intact Hot/Cold: Not tested Proprioception: Appears Intact Stereognosis: Not tested Coordination Gross Motor  Movements are Fluid and Coordinated: Yes Fine Motor Movements are Fluid and Coordinated: Yes Coordination and Movement Description: generalized weakness and deconditioning Finger Nose Finger Test: [facility] bilaterally Motor  Motor Motor: Within Functional Limits  Mobility Bed Mobility Bed Mobility: Rolling Right;Rolling Left;Sit to Supine;Supine to Sit Rolling Right: Independent with assistive device Rolling Left: Independent with assistive device Supine to Sit: Independent with assistive device Sit to Supine: Independent with assistive device Transfers Transfers: Sit to Stand;Stand to Sit;Stand Pivot Transfers Sit to Stand: Independent with assistive device Stand to Sit: Independent with assistive device Stand Pivot Transfers: Independent with assistive device Transfer (Assistive device): Other (Comment) (walking stick) Locomotion  Gait Ambulation: Yes Gait Assistance: Independent with assistive device Gait Distance (Feet): 300 Feet Assistive device: Other (Comment) (walking stick) Gait Gait: Yes Gait Pattern: Impaired Gait Pattern: Step-to pattern;Step-through pattern;Decreased step length - right;Decreased step length - left;Poor foot clearance - left;Poor foot clearance - right;Narrow base of support Gait velocity: decreased Stairs / Additional Locomotion Stairs: Yes Stairs Assistance: Supervision/Verbal cueing Stair Management Technique: One rail Right;Alternating pattern;Forwards Number of Stairs: 4 Height of Stairs: 6 Ramp: Supervision/Verbal cueing (walking stick) Curb: Supervision/Verbal cueing (walking stick) Wheelchair Mobility Wheelchair Mobility: No  Trunk/Postural Assessment  Cervical Assessment Cervical Assessment: Exceptions to [facility] (forward head) Thoracic Assessment Thoracic Assessment: Exceptions to [facility] (thoracic rounding) Lumbar Assessment Lumbar Assessment: Exceptions to [facility] (posterior pelvic tilt) Postural Control Postural Control: Within Functional  Limits  Balance Balance Balance Assessed: Yes Static Sitting Balance Static Sitting - Balance Support: No upper extremity supported;Feet supported Static Sitting - Level of Assistance: 7: Independent Dynamic Sitting Balance Dynamic Sitting - Balance Support: Feet supported;No upper extremity supported Dynamic Sitting - Level of Assistance: 6: Modified independent (Device/Increase time) Static Standing Balance Static Standing - Balance Support: During functional activity;Right upper extremity supported Static Standing - Level of Assistance: 6: Modified independent (Device/Increase time) Dynamic Standing Balance Dynamic Standing - Balance Support: During functional activity;Right upper extremity supported Dynamic  Standing - Level of Assistance: 6: Modified independent (Device/Increase time) Dynamic Standing - Comments: with transfers and ambulation Extremity Assessment  RLE Assessment RLE Assessment: Not tested General Strength Comments: not formally tested; grossly 4/5 LLE Assessment LLE Assessment: Not tested General Strength Comments: not formally tested; grossly 4/5   Michael [name] Stains PT, DPT 11/20/2023, 8:22 AM

## 2023-11-20 NOTE — Progress Notes (Signed)
 Physical Therapy Session Note  Patient Details  Name: Michael Bender MRN: [id number] Date of Birth: July 03, 1945  Today's Date: 11/20/2023 PT Individual Time: [phone] and 1330-1408 PT Individual Time Calculation (min): 54 min and 38 min  Short Term Goals: Week 1:  PT Short Term Goal 1 (Week 1): STG=LTG due to LOS  Skilled Therapeutic Interventions/Progress Updates:   Treatment Session 1 Received pt semi-reclined in bed asleep. Required ++ time to gently awake pt and pt agreeable to PT treatment and denied any pain during session. Pt continues to report not sleeping well at night. Session with emphasis on functional mobility/transfers, generalized strengthening and endurance, dynamic standing balance/coordination, NMR, stair navigation,  and ambulation. Pt performed bed mobility with HOB elevated and mod I x 2 trials throughout session. Pt donned shoes and jacket with setup assist and performed all transfers with walking stick and close supervision throughout session. Pt transported to/from room in [facility] dependently per request due to fatigue.   Pt ambulated 38ft x 2 trials using walking sticks and CGA fading to close supervision to/from staircase. Pt navigated 4 6in steps x 2 trials with 1 handrail and CGA fading to close supervision ascending and descending with a step through pattern. Pt required standing rest break at top of stairs on trial 1 due to SOB - HR 82bpm and SPO2 100%.   Worked on dynamic standing balance/coordination performing toe taps to 3 cones x 3 trials with each LE and CGA for balance (used walking stick for support). Transitioned to alternating toe taps to 6in step 2x20 with CGA for balance (using walking stick). Pt ambulated ~170ft stepping over 6in hurdles with walking stick and CGA for balance. In // bars, pt performed lateral step ups onto 6in step x8 bilaterally with BUE support with emphasis on hip abductor strengthening. Pt then ambulated 225ft with walking stick and  close supervision. Returned to room and sat in [facility] at sink and brushed teeth mod I. Pt requested to return to bed and concluded session with pt semi-reclined in bed, needs within reach, and bed alarm on.   Treatment Session 2 Received pt semi-reclined in bed with son at bedside. Pt agreeable to PT treatment and denied any pain during session. Session with emphasis on functional mobility/transfers, generalized strengthening and endurance, dynamic standing balance/coordination, simulated car transfers, and ambulation. Pt performed bed mobility with HOB elevated and mod I x 2 trials throughout session. Donned shoes mod I and performed all transfers mod I using walking stick. Pt transported to/from room in [facility] dependently for energy conservation purposes per pt request. Pt performed simulated car transfer with walking stick and mod I, then ambulated 23ft on uneven surfaces (ramp and mulch) and descended 1 5in curb with walking stick and supervision. Pt able to stand and pick up object from floor using reacher and supervision. Pt then performed seated BLE strengthening on Kinetron at 20 cm/sec for 1 minute x 4 trials with emphasis on glute/quad strength. Pt then ambulated 17ft with walking stick and mod I. Returned to room and concluded session with pt semi-reclined in bed with all needs within reach.   Therapy Documentation Precautions:  Precautions Precautions: Fall, Sternal Precaution Booklet Issued: Yes (comment) Recall of Precautions/Restrictions: Impaired Restrictions Weight Bearing Restrictions Per Provider Order: No Other Position/Activity Restrictions: Sternal  Therapy/Group: Individual Therapy Therisa HERO Zaunegger Therisa Stains PT, DPT 11/20/2023, 6:54 AM

## 2023-11-20 NOTE — Progress Notes (Signed)
 Occupational Therapy Session Note  Patient Details  Name: Michael Bender MRN: [id number] Date of Birth: 1945-11-16  Today's Date: 11/20/2023 OT Individual Time: [phone] OT Individual Time Calculation (min): 57 min    Short Term Goals: Week 1:  OT Short Term Goal 1 (Week 1): LTG=STG d/t ELOS  Skilled Therapeutic Interventions/Progress Updates: Patient received resting in bed. Reports having a poor nights sleep, but is willing to participate with therapy. Patient performed ADL's as listed below. Good safety and recall of sternal precautions. Ambulated with walking stick to ADL suite for home safety education. Patient reports usually siting on EOB at home and not having to get up from any low surfaces. He plans to give his son a grocery list until his endurance improves and he can go with his son to shop. Good safety awareness with reaching for items at varied heights and reports son will be coming by daily to ensure patient is doing well and can assist with IADL's that are difficult to follow while following his precautions. Patient ambulated back to his room and was left resting in bed with call bell at his side.      Therapy Documentation Precautions:  Precautions Precautions: Fall, Sternal Precaution Booklet Issued: Yes (comment) Recall of Precautions/Restrictions: Intact Restrictions Weight Bearing Restrictions Per Provider Order: No Other Position/Activity Restrictions: Sternal General:   Vital Signs: Therapy Vitals Temp: 98 F (36.7 C) Pulse Rate: 76 Resp: 19 BP: (!) 144/63 Patient Position (if appropriate): Lying Oxygen Therapy SpO2: 99 % O2 Device: Room Air Pain: Pain Assessment Pain Score:   ADL: ADL Eating: Independent Where Assessed-Eating: Bed level Grooming: Independent Where Assessed-Grooming: Standing at sink Upper Body Bathing: Modified Independent Where Assessed-Upper Body Bathing: Shower Lower Body Bathing: Modified Independent Where  Assessed-Lower Body Bathing: shower seated Upper Body Dressing: Modified Independent Where Assessed-Upper Body Dressing:Edge of bed Lower Body Dressing: Modified Independent Where Assessed-Lower Body Dressing: Edge of bed Toileting: Modified Independent Where Assessed-Toileting: Teacher, adult education: Engineer, agricultural Method: Event organiser: Cytogeneticist Method: Ambulating    Therapy/Group: Individual Therapy  Michael Bender 11/20/2023, 9:46 AM

## 2023-11-20 NOTE — Telephone Encounter (Signed)
 Pharmacy Patient Advocate Encounter  Insurance verification completed.    The patient is insured through [employer]. Patient has Medicare and is not eligible for a copay card, but may be able to apply for patient assistance or Medicare RX Payment Plan (Patient Must reach out to their plan, if eligible for payment plan), if available.    Ran test claim for Tradjenta 5mg  and the current 30 day co-pay is $0.   This test claim was processed through [employer]- copay amounts may vary at other pharmacies due to [employer], or as the patient moves through the different stages of their insurance plan.

## 2023-11-20 NOTE — Progress Notes (Incomplete)
 Inpatient Rehabilitation Discharge Medication Review by a Pharmacist  A complete drug regimen review was completed for this patient to identify any potential clinically significant medication issues.  High Risk Drug Classes Is patient taking? Indication by Medication  Antipsychotic Yes  Seroquel - insomnia   Anticoagulant No   Antibiotic No   Opioid Yes Oxycodone -pain  Antiplatelet Yes Aspirin-CAD  Hypoglycemics/insulin  Yes Insulin  glargine, empagliflozin -DM  Vasoactive Medication Yes Amlodipine -HTN Metoprolol -Afib, CAD, HF Furosemide -HF Amiodarone-Afib  Chemotherapy No   Other Yes Keppra-seizures MVI, b-complex vitamin, magnesium  gluconate, VitD-supplementation Rosuvastatin -HLD     Type of Medication Issue Identified Description of Issue Recommendation(s)  Drug Interaction(s) (clinically significant)     Duplicate Therapy     Allergy     No Medication Administration End Date     Incorrect Dose     Additional Drug Therapy Needed  Entresto /Aldactone  on hold for elevated Scr during acute admit Recommend restart for HF when/if appropriate  Significant med changes from prior encounter (inform family/care partners about these prior to discharge). PTA meds not resumed: Elavil , semaglutide , flexeril , requip , sumatriptan   Restart at discharge from CIR when appropriate.  Other       Clinically significant medication issues were identified that warrant physician communication and completion of prescribed/recommended actions by midnight of the next day:  No  Name of provider notified for urgent issues identified:   Provider Method of Notification:     Pharmacist comments:   Time spent performing this drug regimen review (minutes):  20

## 2023-11-20 NOTE — Discharge Summary (Signed)
 Physician Discharge Summary  Patient ID: Michael Bender MRN: [id number] DOB/AGE: 1945-03-22 78 y.o.  Admit date: 11/15/2023 Discharge date: 11/21/2023  Discharge Diagnoses:  Principal Problem:   Debility Active Problems:   GERD   CONSTIPATION, CHRONIC   Primary hypertension   Insomnia   Peripheral arterial disease   History of CVA (cerebrovascular accident)   Anemia   HLD (hyperlipidemia)   COPD (chronic obstructive pulmonary disease) (HCC)   Iron deficiency anemia   Chronic heart failure with preserved ejection fraction (HFpEF, >= 50%) (HCC)   Stage 3 chronic kidney disease (HCC)   Restless legs   Discharged Condition: stable  Significant Diagnostic Studies: DG Chest 2 View Result Date: 11/14/2023 CLINICAL DATA:  Status post cardiac surgery. EXAM: CHEST - 2 VIEW COMPARISON:  11/13/2023 FINDINGS: Lungs are hyperexpanded. No pneumothorax or substantial pleural effusion. No focal consolidation or evidence of pulmonary edema. The cardiopericardial silhouette is within normal limits for size. Right PICC line tip overlies the mid to lower SVC level. Telemetry leads overlie the chest. IMPRESSION: Hyperexpansion without acute cardiopulmonary findings. Electronically Signed   By: Camellia Candle M.D.   On: 11/14/2023 05:44   DG CHEST PORT 1 VIEW Result Date: 11/13/2023 CLINICAL DATA:  Status post aortic valve replacement. EXAM: PORTABLE CHEST 1 VIEW COMPARISON:  Prior chest x-ray 11/11/2023 FINDINGS: The mediastinal drains have been removed. Patient is status post median sternotomy with evidence of left atrial appendage ligation and aortic valve replacement. Stable cardiac and mediastinal contours. Extensive atherosclerotic calcifications throughout the aorta. Right upper extremity PICC in good position with the tip overlying the mid SVC. The lungs are clear. No edema, pleural effusion or pneumothorax. No acute osseous abnormality. IMPRESSION: 1. Interval removal of mediastinal drains. 2.  No acute cardiopulmonary process. 3. Well-positioned right upper extremity PICC with the catheter tip overlying the mid SVC. Electronically Signed   By: Wilkie Lent M.D.   On: 11/13/2023 09:41   DG CHEST PORT 1 VIEW Result Date: 11/11/2023 EXAM: 1 VIEW(S) XRAY OF THE CHEST 11/11/2023 01:29:00 PM COMPARISON: 11/10/2023 CLINICAL HISTORY: PICC (peripherally inserted central catheter) in place [id number]. PICC in place. FINDINGS: LINES, TUBES AND DEVICES: Right upper extremity PICC terminates in distal SVC. Mediastinal drains present. LUNGS AND PLEURA: No focal pulmonary opacity. No pulmonary edema. No pneumothorax identified. HEART AND MEDIASTINUM: Normal cardiac and mediastinal contours status post median sternotomy. Aortic valve replacement and left atrial appendage clip present. Aortic atherosclerosis noted. BONES AND SOFT TISSUES: Status post median sternotomy. IMPRESSION: 1. Right upper extremity PICC terminates in the distal  SVC. Electronically signed by: Waddell Calk MD 11/11/2023 01:43 PM EDT RP Workstation: [id number]   US  EKG SITE RITE Result Date: 11/11/2023 If Site Rite image not attached, placement could not be confirmed due to current cardiac rhythm.  MR BRAIN WO CONTRAST Result Date: 11/10/2023 EXAM: MRI BRAIN WITHOUT CONTRAST 11/10/2023 02:25:37 PM TECHNIQUE: Multiplanar multisequence MRI of the head/brain was performed without the administration of intravenous contrast. COMPARISON: Head CT and CTA 11/09/2023 and MRI 02/17/2023. CLINICAL HISTORY: New-onset seizure, no history of trauma. FINDINGS: BRAIN AND VENTRICLES: No acute infarct, mass, midline shift, hydrocephalus, or extra-axial fluid collection. There is mild to moderate cerebral atrophy. Patchy to confluent T2 hyperintensities in the cerebral white matter bilaterally are similar to the prior MRI and are nonspecific but compatible with moderately severe chronic small vessel ischemic disease. Chronic infarcts are again seen in the  basal ganglia, thalami, and cerebellum. Scattered chronic cerebral and cerebellar microhemorrhages are stable to minimally  increased from the prior MRI. There are chronic blood products associated with the chronic infarct involving the left basal ganglia and corona radiata. The sella is unremarkable. Normal flow voids. ORBITS: Bilateral cataract extraction. SINUSES AND MASTOIDS: Chronic right sphenoid sinusitis. Clear mastoid air cells. BONES AND SOFT TISSUES: Normal marrow signal. No acute soft tissue abnormality. IMPRESSION: 1. No acute intracranial abnormality. 2. Moderately severe chronic small vessel ischemic disease. Electronically signed by: Dasie Hamburg MD 11/10/2023 02:34 PM EDT RP Workstation: [id number]   DG Chest Port 1 View Result Date: 11/10/2023 CLINICAL DATA:  Status post aortic valve replacement and left atrial appendage clipping on 11/08/2023. EXAM: PORTABLE CHEST 1 VIEW COMPARISON:  11/09/2023 FINDINGS: Stable enlarged cardiac silhouette, mediastinal tubes, prosthetic aortic valve and left atrial clip. Tortuous and partially calcified thoracic aorta. Minimal left basilar atelectasis with improvement. Clear right lung. Unremarkable bones. IMPRESSION: 1. Minimal left basilar atelectasis with improvement. 2. Stable cardiomegaly. Electronically Signed   By: Elspeth Bathe M.D.   On: 11/10/2023 08:24   DG Chest Port 1 View Result Date: 11/09/2023 CLINICAL DATA:  Status post aortic valve replacement and left atrial appendage clip placement on 11/08/2023. EXAM: PORTABLE CHEST 1 VIEW COMPARISON:  11/08/2023 FINDINGS: The cardiac silhouette remains borderline enlarged. Stable right jugular Swan-Ganz catheter with its tip in the main pulmonary artery segment or proximal right main pulmonary artery. The endotracheal and nasogastric tubes have been removed. Stable mediastinal tubes without pneumomediastinum. Interval mild-to-moderate left lower lobe atelectasis. Stable mild peribronchial thickening and  chronic accentuation of the interstitial markings. Unremarkable bones. IMPRESSION: 1. Interval mild-to-moderate left lower lobe atelectasis. 2. Stable mild bronchitic changes. 3. Stable borderline cardiomegaly. Electronically Signed   By: Elspeth Bathe M.D.   On: 11/09/2023 11:06   Overnight EEG with video Result Date: 11/09/2023 Shelton Arlin KIDD, MD     11/13/2023  5:45 PM Patient Name: HEMAN QUE MRN: [id number] Epilepsy Attending: Arlin KIDD Shelton Referring Physician/Provider: Khaliqdina, Salman, MD Duration: 11/09/2023 0530 to 11/10/2023 0344 Patient history: 78yo M with new onset seizure with Left arm and L leg and L face twitching with L gaze. Had a seizure lasting 1 mins, followed shortly by a second seizure. EEG to evaluate for seizure Level of alertness: comatose/ lethargic AEDs during EEG study: LEV, PHT, Ativan Technical aspects: This EEG study was done with scalp electrodes positioned according to the 10-20 International system of electrode placement. Electrical activity was reviewed with band pass filter of 1-70Hz , sensitivity of 7 uV/mm, display speed of 71mm/sec with a 60Hz  notched filter applied as appropriate. EEG data were recorded continuously and digitally stored.  Video monitoring was available and reviewed as appropriate. Description: EEG showed continuous generalized and lateralized left hemisphere 3 to 6 Hz theta-delta slowing admixed with 15 to 18 Hz beta activity distributed symmetrically and diffusely. Hyperventilation and photic stimulation were not performed.   Patient pulled electrodes on 11/10/2023 at around 0344 and subsequently the study was not interpretable ABNORMALITY - Continuous slow, generalized and lateralized left hemisphere IMPRESSION: This study is suggestive of cortical dysfunction arising from left hemisphere likely secondary to underlying structural abnormality, post ictal state. Additionally there is generalized cerebral dysfunction/ encephalopath likely related  to sedation. No seizures or epileptiform discharges were seen throughout the recording. Priyanka O Yadav   CT ANGIO HEAD NECK W WO CM (CODE STROKE) Result Date: 11/09/2023 EXAM: CT HEAD WITHOUT CTA HEAD AND NECK WITH AND WITHOUT 11/09/2023 12:13:14 AM TECHNIQUE: CTA of the head and neck was performed with and  without the administration of 75mL iohexol  (OMNIPAQUE ) 350 MG/ML injection. Noncontrast CT of the head with reconstructed 2-D images are also provided for review. Multiplanar 2D and/or 3D reformatted images are provided for review. Automated exposure control, iterative reconstruction, and/or weight based adjustment of the mA/kV was utilized to reduce the radiation dose to as low as reasonably achievable. COMPARISON: CT head from earlier today. CLINICAL HISTORY: Neuro deficit, acute, stroke suspected. FINDINGS: CT HEAD: BRAIN AND VENTRICLES: No acute intracranial hemorrhage. No mass effect. No midline shift. No extra-axial fluid collection. No evidence of acute infarct. No hydrocephalus. ORBITS: No acute abnormality. SINUSES AND MASTOIDS: No acute abnormality. CTA NECK: AORTIC ARCH AND ARCH VESSELS: No dissection or arterial injury. No significant stenosis of the brachiocephalic or subclavian arteries. CERVICAL CAROTID ARTERIES: Approximately 40% stenosis of the proximal left ICA due to atherosclerosis. No dissection. CERVICAL VERTEBRAL ARTERIES: Moderate narrowing of the left vertebral artery origin. No dissection, arterial injury, or significant stenosis in the right vertebral artery. LUNGS AND MEDIASTINUM: Subcutaneous emphysema tracking in the lower neck and chest wall without visible pneumothorax. SOFT TISSUES: Subcutaneous emphysema tracking in the lower neck and chest wall. BONES: No acute abnormality. CTA HEAD: ANTERIOR CIRCULATION: No significant stenosis of the internal carotid arteries. No significant stenosis of the anterior cerebral arteries. No significant stenosis of the middle cerebral  arteries. No aneurysm. POSTERIOR CIRCULATION: No significant stenosis of the posterior cerebral arteries. No significant stenosis of the basilar artery. No significant stenosis of the vertebral arteries. No aneurysm. OTHER: No dural venous sinus thrombosis on this non-dedicated study. IMPRESSION: 1. No large vessel occlusion. 2. Approximately 40% stenosis of the proximal left ICA due to atherosclerosis. 3. Moderate stenosis at the left vertebral artery origin. 4. Subcutaneous emphysema in the lower neck and chest wall without visible pneumothorax. Electronically signed by: Gilmore Molt MD 11/09/2023 12:37 AM EDT RP Workstation: [id number]   CT HEAD CODE STROKE WO CONTRAST Result Date: 11/09/2023 EXAM: CT HEAD WITHOUT CONTRAST 11/09/2023 12:07:17 AM TECHNIQUE: CT of the head was performed without the administration of intravenous contrast. Automated exposure control, iterative reconstruction, and/or weight based adjustment of the mA/kV was utilized to reduce the radiation dose to as low as reasonably achievable. COMPARISON: None available. CLINICAL HISTORY: Neuro deficit, acute, stroke suspected. Stroke alert: Seizure of left, weak on the right; Neuro: Sal: [phone] FINDINGS: BRAIN AND VENTRICLES: No acute hemorrhage. No evidence of acute infarct. No hydrocephalus. No extra-axial collection. No mass effect or midline shift. Remote left basal ganglia lacunar infarct. Patchy white matter hypodensities, compatible with chronic microvascular ischemic disease. ORBITS: No acute abnormality. SINUSES: No acute abnormality. SOFT TISSUES AND SKULL: No acute soft tissue abnormality. No skull fracture. Findings conveyed to Dr. Sal via pager at 12:19 PM. IMPRESSION: 1. No acute intracranial abnormality. 2. Remote left basal ganglia lacunar infarct. Electronically signed by: Gilmore Molt MD 11/09/2023 12:20 AM EDT RP Workstation: [id number]   DG Chest Port 1 View Result Date: 11/08/2023 CLINICAL DATA:  [id number]  Pneumothorax [id number] EXAM: PORTABLE CHEST 1 VIEW COMPARISON:  Chest x-ray 11/01/2023 FINDINGS: Interval placement of an endotracheal tube with tip terminating approximately 4 5 cm above the carina. Enteric tube courses below the diaphragm with tip collimated off view and side port at the expected region of the gastroesophageal junction. Right internal jugular approach Swan-Ganz catheter with tip overlying the expected region of the proximal right main pulmonary artery. Couple mediastinal drains noted. The heart and mediastinal contours are within normal limits. Aortic valve replacement. Atrial appendage clip. Coarsened  interstitial markings with no overt pulmonary edema. No focal consolidation. No pleural effusion. No pneumothorax. No acute osseous abnormality.  Sternotomy wires are intact. IMPRESSION: 1. No radiographic evidence of pneumothorax. 2. Lines and tubes as above. Electronically Signed   By: Morgane  Naveau M.D.   On: 11/08/2023 18:30   ECHO INTRAOPERATIVE TEE Result Date: 11/08/2023  *INTRAOPERATIVE TRANSESOPHAGEAL REPORT *  Patient Name:   CAVAN BEARDEN Date of Exam: 11/08/2023 Medical Rec #:  [id number]        Height:       71.0 in Accession #:    [id number]       Weight:       136.9 lb Date of Birth:  February 14, 1945       BSA:          1.79 m Patient Age:    77 years         BP:           124/57 mmHg Patient Gender: M                HR:           56 bpm. Exam Location:  Inpatient Transesophogeal exam was perform intraoperatively during surgical procedure. Patient was closely monitored under general anesthesia during the entirety of examination. Indications:     aortic valve replacement Performing Phys: [id number] CON RAMAN SU Diagnosing Phys: Kelly Mace MD Complications: No known complications during this procedure. POST-OP IMPRESSIONS Limited post- CPB exam: The patient separated easily from CPB _ Left Ventricle: The left ventricular function is essnetially unchanged from pre-bypass images. Overall  fuction remains low normal. The initial septal flattening with dual chamber pacing resolved once Atrial pacing was successful. Overall EF 50%. _ Right Ventricle: The right ventricular function appears normal, unchanged from pre-bypass images. _ Aortic Valve: There has been interval replacement of the aortic valve. Artificial valve in the aortic position Manufactured by; [employer] Size; 27mm. There is no regurgitation, and no perivalvular Casy Tavano. There is no stenosis, with peak gradient 7 mmHg, mean gradient 3 mmHg. _ Mitral Valve: The mitral valve function appears unchanged from pre-bypass images. There is mild Mitral regurgitation. _ Tricuspid Valve: The tricuspid valve function appears normal, unchanged from pre-bypass images -There has been interval cipping/closure of the Left Atrial Appendage. PRE-OP FINDINGS  Left Ventricle: The left ventricle has moderately reduced systolic function, with an ejection fraction of 35-40%, measured 40%. The cavity size was normal. Left ventrical global hypokinesis without regional wall motion abnormalities. There is no left ventricular hypertrophy. Left ventricular diastolic function was not evaluated. Right Ventricle: The right ventricle has normal systolic function. The cavity was normal. There is no increase in right ventricular wall thickness. Catheter present in the right ventricle. Left Atrium: Left atrial size was normal in size. No left atrial/left atrial appendage thrombus was detected. Left atrial appendage velocity is normal at greater than 40 cm/s. Right Atrium: Right atrial size was normal in size. Catheter present in the right atrium. Interatrial Septum: No atrial level shunt detected by color flow Doppler. There is no evidence of a patent foramen ovale. Pericardium: There is no evidence of pericardial effusion. Mitral Valve: The mitral valve is normal in structure. Mitral valve regurgitation is not visualized by color flow Doppler. Pulmonary venous flow is normal.  There is no evidence of mitral stenosis, with mean gradient 1 mmHg. Tricuspid Valve: The tricuspid valve was normal in structure. Tricuspid valve regurgitation is trivial by color flow Doppler. No evidence of  tricuspid stenosis is present. There is no evidence of tricuspid valve vegetation. Aortic Valve: The aortic valve is tricuspid. Aortic valve regurgitation is severe by color flow Doppler. The jet is centrally-directed. There is no stenosis of the aortic valve, with mean gradient 4 mmHg, peak gradient 9 mmHg. There is no evidence of aortic valve vegetation. Pulmonic Valve: The pulmonic valve was normal in structure, with normal leaflet mobility and excursion. No evidence of pulmonic stenosis. Pulmonic valve regurgitation is trivial, around the PA catheter, by color flow Doppler. Aorta: The aortic root and ascending aorta are normal in size and structure. The aortic arch was not well visualized. There is evidence of plaque in the descending aorta; Grade I, measuring 1-40mm in size. The LVOT measures 2.3 cm diameter, ascending aorta measures 3.8 cm. Pulmonary Artery: Norva Purl catheter present on the left. The pulmonary artery is of normal size. Venous: The inferior vena cava was not well visualized. +-------------+--------++ AORTIC VALVE          +-------------+--------++ AV Mean Grad:4.0 mmHg +-------------+--------++ +-------------+--------++ MITRAL VALVE          +-------------+--------++ MV Mean grad:1.0 mmHg +-------------+--------++  Kelly Mace MD Electronically signed by Kelly Mace MD Signature Date/Time: 11/08/2023/5:47:21 PM    Final    VAS US  UPPER EXTREMITY ARTERIAL DUPLEX Result Date: 11/07/2023  UPPER EXTREMITY DUPLEX STUDY Patient Name:  BARTT GONZAGA  Date of Exam:   11/07/2023 Medical Rec #: [id number]         Accession #:    [id number] Date of Birth: Nov 27, 1945        Patient Gender: M Patient Age:   19 years Exam Location:  [facility] Procedure:      VAS US   UPPER EXTREMITY ARTERIAL DUPLEX Referring Phys: REDIA CLEAVER --------------------------------------------------------------------------------  Indications: Wrist pain, bruising, palpable knot and Pain right antecubital              fossa area. History:     Patient has a history of catheterization via right radial artery.  Risk Factors: Hypertension, hyperlipidemia. Performing Technologist: Ricka Sturdivant-Jones RDMS, RVT  Examination Guidelines: A complete evaluation includes B-mode imaging, spectral Doppler, color Doppler, and power Doppler as needed of all accessible portions of each vessel. Bilateral testing is considered an integral part of a complete examination. Limited examinations for reoccurring indications may be performed as noted.  Right Doppler Findings: +---------------+----------+---------+--------+--------+ Site           PSV (cm/s)Waveform StenosisComments +---------------+----------+---------+--------+--------+ Subclavian Dist          triphasic                 +---------------+----------+---------+--------+--------+ Brachial Prox  82        triphasic                 +---------------+----------+---------+--------+--------+ Brachial Dist  154       triphasic                 +---------------+----------+---------+--------+--------+ Radial Prox    138       triphasic                 +---------------+----------+---------+--------+--------+ Radial Mid     125       triphasic                 +---------------+----------+---------+--------+--------+ Radial Dist    108       triphasic                 +---------------+----------+---------+--------+--------+  Ulnar Dist     122       triphasic                 +---------------+----------+---------+--------+--------+    Summary:  Right: No evidence of right radial pseudoaneurysm or AVF. Patent        brachial artery. *See table(s) above for measurements and observations. Electronically signed by Debby Robertson  on 11/07/2023 at 4:57:49 PM.    Final    CARDIAC CATHETERIZATION Result Date: 11/06/2023 1.  Patent coronary arteries with mild diffuse calcific plaquing but no significant stenoses 2.  Known severe aortic insufficiency with wide pulse pressure noted 3.  Right heart data: RA mean 3 mmHg RV 30/5 mmHg PA 30/14 mean 20 mmHg Pulmonary wedge pressure 13 mmHg (A-wave 12, V wave 15) LVEDP 9 mmHg Cardiac output/index by Fick measurement 6.1/3.4   VAS US  DOPPLER PRE CABG Result Date: 11/06/2023 PREOPERATIVE VASCULAR EVALUATION Patient Name:  PISTOL KESSENICH  Date of Exam:   11/04/2023 Medical Rec #: [id number]         Accession #:    [id number] Date of Birth: 1945-02-15        Patient Gender: M Patient Age:   50 years Exam Location:  [facility] Procedure:      VAS US  DOPPLER PRE CABG Referring Phys: BAILEY CHAMBERS --------------------------------------------------------------------------------  Indications:      Pre-CABG. Risk Factors:     Hypertension, hyperlipidemia. Comparison Study: No prior studies. Performing Technologist: Gerome Ny RVT  Examination Guidelines: A complete evaluation includes B-mode imaging, spectral Doppler, color Doppler, and power Doppler as needed of all accessible portions of each vessel. Bilateral testing is considered an integral part of a complete examination. Limited examinations for reoccurring indications may be performed as noted.  Right Carotid Findings: +----------+--------+-------+--------+--------------------------------+--------+           PSV cm/sEDV    StenosisDescribe                        Comments                   cm/s                                                    +----------+--------+-------+--------+--------------------------------+--------+ CCA Prox  151     18             smooth, heterogenous and                                                  calcific                                  +----------+--------+-------+--------+--------------------------------+--------+ CCA Distal90      12             smooth and heterogenous                  +----------+--------+-------+--------+--------------------------------+--------+ ICA Prox  85      17             irregular and heterogenous               +----------+--------+-------+--------+--------------------------------+--------+  ICA Mid   80      16                                             tortuous +----------+--------+-------+--------+--------------------------------+--------+ ICA Distal77      13                                             tortuous +----------+--------+-------+--------+--------------------------------+--------+ ECA       133     3                                                       +----------+--------+-------+--------+--------------------------------+--------+ +----------+--------+-------+--------+------------+           PSV cm/sEDV cmsDescribeArm Pressure +----------+--------+-------+--------+------------+ Subclavian84                                  +----------+--------+-------+--------+------------+ +---------+--------+--+--------+--+---------+ VertebralPSV cm/s49EDV cm/s12Antegrade +---------+--------+--+--------+--+---------+ Left Carotid Findings: +----------+--------+--------+--------+-----------------------+--------+           PSV cm/sEDV cm/sStenosisDescribe               Comments +----------+--------+--------+--------+-----------------------+--------+ CCA Prox  95      11                                              +----------+--------+--------+--------+-----------------------+--------+ CCA Distal110     9               smooth and heterogenous         +----------+--------+--------+--------+-----------------------+--------+ ICA Prox  128     20              smooth and heterogenous          +----------+--------+--------+--------+-----------------------+--------+ ICA Mid   132     21                                              +----------+--------+--------+--------+-----------------------+--------+ ICA Distal144     17                                     tortuous +----------+--------+--------+--------+-----------------------+--------+ ECA       103     1                                               +----------+--------+--------+--------+-----------------------+--------+ +----------+--------+--------+--------+------------+ SubclavianPSV cm/sEDV cm/sDescribeArm Pressure +----------+--------+--------+--------+------------+           92                                   +----------+--------+--------+--------+------------+ +---------+--------+--+--------+-+---------+  VertebralPSV cm/s55EDV cm/s7Antegrade +---------+--------+--+--------+-+---------+  ABI Findings: +------------------+-----+-----------+ Rt Pressure (mmHg)IndexWaveform    +------------------+-----+-----------+ 124                    triphasic   +------------------+-----+-----------+ 187               1.51 multiphasic +------------------+-----+-----------+ 161               1.30 multiphasic +------------------+-----+-----------+ +------------------+-----+----------+ Lt Pressure (mmHg)IndexWaveform   +------------------+-----+----------+ 118                    triphasic  +------------------+-----+----------+ 254               2.05 monophasic +------------------+-----+----------+ 58                0.47 monophasic +------------------+-----+----------+ +-------+---------------+ ABI/TBIToday's ABI/TBI +-------+---------------+ Right  1.51            +-------+---------------+ Left   0.47            +-------+---------------+  Right Doppler Findings: +--------+--------+---------+ Site    PressureDoppler   +--------+--------+---------+ [id number]     triphasic  +--------+--------+---------+ Radial          triphasic +--------+--------+---------+ Ulnar           triphasic +--------+--------+---------+  Left Doppler Findings: +--------+--------+---------+ Site    PressureDoppler   +--------+--------+---------+ [id number]     triphasic +--------+--------+---------+ Radial          triphasic +--------+--------+---------+ Ulnar           triphasic +--------+--------+---------+   Summary: Right Carotid: Velocities in the right ICA are consistent with a 1-39% stenosis. Left Carotid: Velocities in the left ICA are consistent with a 1-39% stenosis. Vertebrals: Bilateral vertebral arteries demonstrate antegrade flow. Right ABI: Resting right ankle-brachial index indicates noncompressible right lower extremity arteries. Left ABI: Resting left ankle-brachial index indicates severe left lower extremity arterial disease. Right Upper Extremity: Doppler waveform obliterate with right radial compression. Doppler waveform obliterate with right ulnar compression. Left Upper Extremity: Doppler waveform obliterate with left radial compression. Doppler waveforms decrease >50% with left ulnar compression.  Electronically signed by Gaile New MD on 11/06/2023 at 8:16:53 AM.    Final    ECHOCARDIOGRAM COMPLETE Result Date: 11/03/2023    ECHOCARDIOGRAM REPORT   Patient Name:   CALEL PISARSKI Date of Exam: 11/02/2023 Medical Rec #:  [id number]        Height:       71.0 in Accession #:    [id number]       Weight:       153.2 lb Date of Birth:  06/23/1945       BSA:          1.883 m Patient Age:    89 years         BP:           112/64 mmHg Patient Gender: M                HR:           74 bpm. Exam Location:  Inpatient Procedure: 2D Echo, Cardiac Doppler and Color Doppler (Both Spectral and Color            Flow Doppler were utilized during procedure).                                 MODIFIED REPORT:  This report  was modified by Vinie Maxcy MD on 11/03/2023 due to  Re-evaluated           aortic insufficiency in light of more recent TEE comparison.  Indications:     Aortic regurgitation  History:         Patient has prior history of Echocardiogram examinations, most                  recent 04/21/2023. CHF, COPD, Signs/Symptoms:Shortness of                  Breath; Risk Factors:Hypertension and Former Smoker.  Sonographer:     Juliene Rucks Referring Phys:  [id number] ZANE ADAMS Diagnosing Phys: Vinie Maxcy MD  Sonographer Comments: Technically difficult study due to poor echo windows. Image acquisition challenging due to COPD and Image acquisition challenging due to respiratory motion. IMPRESSIONS  1. Technically difficult study, images off axis  2. Left ventricular ejection fraction, by estimation, is 60 to 65%. Left ventricular ejection fraction by PLAX is 61 %. The left ventricle has normal function. The left ventricle has no regional wall motion abnormalities. Left ventricular diastolic parameters are consistent with Grade I diastolic dysfunction (impaired relaxation).  3. Right ventricular systolic function is normal. The right ventricular size is normal.  4. The mitral valve is normal in structure. No evidence of mitral valve regurgitation.  5. Calcified aortic root. There appears to be eccentric, posteriorly directed regurgitation (worst on image 77). The aortic valve was not well visualized. Aortic valve regurgitation is moderate. Aortic valve sclerosis/calcification is present, without any evidence of aortic stenosis. Comparison(s): Changes from prior study are noted. 07/24/2023: LVEF 50-55%, severe AI with oscillating density on the left coronary cusp. FINDINGS  Left Ventricle: Left ventricular ejection fraction, by estimation, is 60 to 65%. Left ventricular ejection fraction by PLAX is 61 %. The left ventricle has normal function. The left ventricle has no regional wall motion abnormalities. The left ventricular internal cavity size was normal in size. There is no left  ventricular hypertrophy. Left ventricular diastolic parameters are consistent with Grade I diastolic dysfunction (impaired relaxation). Indeterminate filling pressures. Right Ventricle: The right ventricular size is normal. No increase in right ventricular wall thickness. Right ventricular systolic function is normal. Left Atrium: Left atrial size was normal in size. Right Atrium: Right atrial size was normal in size. Pericardium: There is no evidence of pericardial effusion. Mitral Valve: The mitral valve is normal in structure. No evidence of mitral valve regurgitation. Tricuspid Valve: The tricuspid valve is not well visualized. Tricuspid valve regurgitation is not demonstrated. Aortic Valve: Calcified aortic root. There appears to be eccentric, posteriorly directed regurgitation (worst on image 77). The aortic valve was not well visualized. There is moderate aortic valve annular calcification. Aortic valve regurgitation is moderate. Aortic valve sclerosis/calcification is present, without any evidence of aortic stenosis. Pulmonic Valve: The pulmonic valve was normal in structure. Pulmonic valve regurgitation is not visualized. Aorta: The aortic root and ascending aorta are structurally normal, with no evidence of dilitation. Venous: The inferior vena cava was not well visualized. IAS/Shunts: The interatrial septum was not well visualized.  LEFT VENTRICLE PLAX 2D LV EF:         Left            Diastology                ventricular     LV e' medial:    4.97 cm/s  ejection        LV E/e' medial:  7.8                fraction by     LV e' lateral:   7.77 cm/s                PLAX is 61      LV E/e' lateral: 5.0                %. LVIDd:         4.00 cm LVIDs:         2.70 cm LV PW:         1.10 cm LV IVS:        1.00 cm LVOT diam:     2.10 cm LVOT Area:     3.46 cm  RIGHT VENTRICLE RV Basal diam:  3.30 cm RV Mid diam:    3.00 cm RV S prime:     17.60 cm/s TAPSE (M-mode): 1.5 cm LEFT ATRIUM            Index        RIGHT ATRIUM           Index LA diam:      2.70 cm 1.43 cm/m   RA Area:     13.80 cm LA Vol (A4C): 37.3 ml 19.81 ml/m  RA Volume:   33.60 ml  17.85 ml/m  MITRAL VALVE MV Area (PHT): 2.83 cm    SHUNTS MV Decel Time: 268 msec    Systemic Diam: 2.10 cm MV E velocity: 38.80 cm/s MV A velocity: 63.40 cm/s MV E/A ratio:  0.61 Vinie Maxcy MD Electronically signed by Vinie Maxcy MD Signature Date/Time: 11/02/2023/2:12:15 PM    Final (Updated)    CT CHEST WO CONTRAST Result Date: 11/02/2023 CLINICAL DATA:  CABG workup short of breath EXAM: CT CHEST WITHOUT CONTRAST TECHNIQUE: Multidetector CT imaging of the chest was performed following the standard protocol without IV contrast. RADIATION DOSE REDUCTION: This exam was performed according to the departmental dose-optimization program which includes automated exposure control, adjustment of the mA and/or kV according to patient size and/or use of iterative reconstruction technique. COMPARISON:  Chest x-ray 11/01/2023, chest CT 07/17/2019 FINDINGS: Cardiovascular: Limited assessment without intravenous contrast. Advanced aortic atherosclerosis. Mild aneurysmal dilatation of the ascending aorta, measuring 4.1 cm maximum. Multi-vessel advanced coronary vascular calcification. Normal cardiac size. No pericardial effusion Mediastinum/Nodes: Patent trachea. No thyroid  mass. No suspicious lymph nodes. Esophagus within normal limits. Lungs/Pleura: Emphysema. Scarring within the left upper and lower lobes. Band like density and thickening along the right posterior pleural surface with a few scattered calcifications suggestive of chronic scarring. Mild right lower lobe bronchiectasis. These findings are new when compared to lung bases from CT of September 2024. A few scattered punctate pulmonary nodules, largest seen in the left base and measures 3 mm on series 3, image 119. Upper Abdomen: No acute finding. Multiple renal cysts incompletely visualize, no specific  imaging follow-up is recommended. Numerous pancreatic calcifications with pancreatic atrophy consistent with chronic pancreatitis. Musculoskeletal: No acute osseous abnormality. IMPRESSION: 1. Emphysema. Scarring within the left upper and lower lobes. Band like density and thickening along the right posterior pleural surface with a few scattered calcifications suggestive of chronic scarring but new compared with 2024. Mild right lower lobe bronchiectasis. 2. Few small punctate pulmonary nodules measuring up to 3 mm No follow-up needed if patient is low-risk (and has no known or suspected primary neoplasm).  Non-contrast chest CT can be considered in 12 months if patient is high-risk. This recommendation follows the consensus statement: Guidelines for Management of Incidental Pulmonary Nodules Detected on CT Images: From the Fleischner Society 2017; Radiology 2017; 284:228-243. 3. Aortic atherosclerosis. Mild aneurysmal dilatation of ascending aorta up to 4.1 cm. Recommend annual imaging followup by CTA or MRA. This recommendation follows 2010 ACCF/AHA/AATS/ACR/ASA/SCA/SCAI/SIR/STS/SVM Guidelines for the Diagnosis and Management of Patients with Thoracic Aortic Disease. Circulation. 2010; 121: Z733-z630. Aortic aneurysm NOS (ICD10-I71.9) 4. Findings consistent with chronic pancreatitis Aortic Atherosclerosis (ICD10-I70.0) and Emphysema (ICD10-J43.9). Electronically Signed   By: Luke Bun M.D.   On: 11/02/2023 21:21   VAS US  LOWER EXTREMITY VENOUS (DVT) Result Date: 11/02/2023  Lower Venous DVT Study Patient Name:  GEOGE LAWRANCE  Date of Exam:   11/02/2023 Medical Rec #: [id number]         Accession #:    [id number] Date of Birth: 1945/12/19        Patient Gender: M Patient Age:   77 years Exam Location:  [facility] Procedure:      VAS US  LOWER EXTREMITY VENOUS (DVT) Referring Phys: EKTA PATEL --------------------------------------------------------------------------------  Indications: SOB, and Edema  (Leg size one is larger than other).  Comparison Study: No previous exams Performing Technologist: Jody Hill RVT, RDMS  Examination Guidelines: A complete evaluation includes B-mode imaging, spectral Doppler, color Doppler, and power Doppler as needed of all accessible portions of each vessel. Bilateral testing is considered an integral part of a complete examination. Limited examinations for reoccurring indications may be performed as noted. The reflux portion of the exam is performed with the patient in reverse Trendelenburg.  +---------+---------------+---------+-----------+----------+--------------+ RIGHT    CompressibilityPhasicitySpontaneityPropertiesThrombus Aging +---------+---------------+---------+-----------+----------+--------------+ CFV      Full           Yes      Yes                                 +---------+---------------+---------+-----------+----------+--------------+ SFJ      Full                                                        +---------+---------------+---------+-----------+----------+--------------+ FV Prox  Full           Yes      Yes                                 +---------+---------------+---------+-----------+----------+--------------+ FV Mid   Full           Yes      Yes                                 +---------+---------------+---------+-----------+----------+--------------+ FV DistalFull           Yes      Yes                                 +---------+---------------+---------+-----------+----------+--------------+ PFV      Full                                                        +---------+---------------+---------+-----------+----------+--------------+  POP      Full           Yes      Yes                                 +---------+---------------+---------+-----------+----------+--------------+ PTV      Full                                                         +---------+---------------+---------+-----------+----------+--------------+ PERO     Full                                                        +---------+---------------+---------+-----------+----------+--------------+   +---------+---------------+---------+-----------+----------+--------------+ LEFT     CompressibilityPhasicitySpontaneityPropertiesThrombus Aging +---------+---------------+---------+-----------+----------+--------------+ CFV      Full           Yes      Yes                                 +---------+---------------+---------+-----------+----------+--------------+ SFJ      Full                                                        +---------+---------------+---------+-----------+----------+--------------+ FV Prox  Full           Yes      Yes                                 +---------+---------------+---------+-----------+----------+--------------+ FV Mid   Full           Yes      Yes                                 +---------+---------------+---------+-----------+----------+--------------+ FV DistalFull           Yes      Yes                                 +---------+---------------+---------+-----------+----------+--------------+ PFV      Full                                                        +---------+---------------+---------+-----------+----------+--------------+ POP      Full           Yes      Yes                                 +---------+---------------+---------+-----------+----------+--------------+ PTV  Full                                                        +---------+---------------+---------+-----------+----------+--------------+ PERO     Full                                                        +---------+---------------+---------+-----------+----------+--------------+     Summary: BILATERAL: - No evidence of deep vein thrombosis seen in the lower extremities, bilaterally. -No evidence of  popliteal cyst, bilaterally.   *See table(s) above for measurements and observations. Electronically signed by Debby Robertson on 11/02/2023 at 5:37:58 PM.    Final    DG Chest 2 View Result Date: 11/01/2023 CLINICAL DATA:  Dyspnea on exertion. EXAM: CHEST - 2 VIEW COMPARISON:  04/24/2023 and 04/20/2023 as well as 11/05/2022 FINDINGS: Lungs are adequately inflated without acute airspace consolidation or effusion. No pneumothorax. Possible small nodular opacity over the right mid to lower lung. Cardiomediastinal silhouette and remainder of the exam is unchanged. IMPRESSION: 1. No acute cardiopulmonary disease. 2. Possible small nodular opacity over the right mid to lower lung. Recommend follow-up chest radiograph 3 months. If persistent, recommend noncontrast chest CT for further evaluation. Electronically Signed   By: Toribio Agreste M.D.   On: 11/01/2023 10:50    Labs:  Basic Metabolic Panel: Recent Labs  Lab 11/14/23 0412 11/15/23 0408 11/16/23 0542 11/20/23 0644  NA 139 139 139 137  K 4.2 3.7 3.5 3.8  CL 105 107 105 104  CO2 22 22 22 23   GLUCOSE 142* 152* 158* 170*  BUN 40* 37* 31* 34*  CREATININE 2.09* 1.90* 1.58* 1.80*  CALCIUM  7.6* 7.7* 8.2* 8.3*  MG 2.0  --  2.1  --     CBC: Recent Labs  Lab 11/14/23 0412 11/16/23 0542 11/20/23 0644  WBC 5.2 6.5 9.0  NEUTROABS  --  4.8  --   HGB 10.2* 11.6* 10.5*  HCT 31.1* 36.1* 31.2*  MCV 89.4 90.3 90.7  PLT 104* 164 376    CBG: Recent Labs  Lab 11/19/23 1410 11/19/23 1638 11/19/23 2104 11/20/23 0612 11/20/23 1140  GLUCAP 267* 211* 267* 161* 229*    Brief HPI:   Michael Bender is a 78 y.o. male  with PMHx of hypertension, hyperlipidemia, uncontrolled T2DM (last HgbA1C 8.4), paroxysmal atrial fibrillation not on anticoagulation due to falls, BPH, history of GI bleed due to duodenal ulcer, AKI on CKD stage IIIb, peripheral arterial disease, chronic iron deficiency anemia, prior tobacco abuse quit 5 years ago, COPD, combined  systolic and diastolic heart failure, and degenerative spine disease S/P multiple surgeries.  He was seen by cardiothoracic surgery on 08/30/2023 with plan for open surgical aortic valve replacement as patient was not a candidate for TAVR.  He presented to [facility] 11/01/23 with complaints of exertional dyspnea barely able to walk 5 feet without shortness of breath and overall weakness.  ED labs: Sodium bicarb 18, BUN 48, serum creatinine 3.0, proBNP 634, and troponin 67> 62 likely due to demand ischemia. Abnormal chest x-ray showing possible small nodular opacity over the right mid to lower lung with recommended chest x-ray  in 3 months along with noncontrast chest CT.  EKG negative for acute acute ischemia and showed NSR with prolonged QTc.  Blood cultures were collected and showed no growth.  An echo revealed LVEF 60 to 65% and mild aortic regurgitation but the aortic valve is not well-visualized.  Cardiac MRI suggestive of severe AI.  The patient underwent an aortic valve replacement with a 21 mm Edwards Inspiris bioprosthetic valve on 11/08/2023 by Dr. Con Clunes.  Following the procedure, patient was admitted to surgical ICU and was hemodynamically stable. He was weaned from the ventilator per protocol but noted to have decreased consciousness, disorientation and not following commands. Hospital course complicated by right facial droop along with bilateral arm and lower extremity weakness and right side neglect. CT scan head/neck was negative for acute findings. Neurology consulted and he was loaded with Keppra for suspected seizure activity. An EEG revealed no seizures or epileptiform discharges. There was evidence of generalized cerebral dysfunction/encephalopathy felt to be related to sedation. Neurology recommended MRI of brain on 10/10/205 that showed moderately severe chronic small vessel ischemic disease but not acute abnormality.  The patient developed atrial fibrillation with RVR and  was started on IV amiodarone. AKI on CKD stage 3b, creatinine is not improved. He was maintained on Heparin  for DVT prophylaxis. Pt reports poor sleep at night. Constipation treated with laxatives with improvement.  Per chart review the patient currently lives alone in a 1 level home and was able to complete ADLs with frequent rest/sit breaks. He currently requires Min A to CGA with mobility and basic ADLs. Therapy evaluations completed due to patient decreased functional mobility was admitted for a comprehensive rehab program.     Inpatient Rehabilitation Course: Michael Bender was admitted to rehab 11/15/2023 for inpatient therapies to consist of PT, ST and OT at least three hours five days a week. Past admission physiatrist, therapy team and rehab RN have worked together to provide customized collaborative inpatient rehab.  Anticoagulation: Heparin  transition to aspirin 81 mg daily.  Pain Management: Oxycodone  as needed.  Imitrex  as needed for headache.  Mood/Behavior/Sleep: Insomnia treated with melatonin, trazodone and Seroquel  not effective.   Dr. Corina Neuro Psychologist consulted for coping and adjustment issues in the setting of extended hospital stay. No new medications or follow up advised.    Skin/Wound Care: Maintain sternal precautions.  Monitor chest tube sites and incisions for signs and symptoms of infection.  Wound care/signs and symptoms of infection discussed at discharge.    Fluid/Nutrition/Electrolytes: The patient was maintained on a diabetic diet with vitamin D3/Metanx/BC complex supplementation.  GERD: Continue PPI  Acute on chronic diastolic HF: Spironolactone  and Entresto  not resumed due to creatinine.  Continue Lasix  40 mg daily.  Monitor daily weights.    Atrial fibrillation: Postoperative A-fib, now on amiodarone 400 mg twice daily, Lopressor  50 mg twice daily and amlodipine  10 mg.  Acute blood loss anemia: S/p postop acute anemia.  Monitored CBC, H&H  stable.  History of seizures: Continue Keppra 500 mg twice daily  Thrombocytopenia: Platelets stable 104 K  COPD: Patient stable on room air.  CPAP at bedtime.    Hyperlipidemia: Crestor  40 mg  RLS: Continue Requip  0.25 mg as needed.   T2DM: Hemoglobin A1c 7.7, diabetes has been monitored with ac/hs CBG checks and SSI was use prn for tighter BS control. Diabetic teaching was completed. Speak with PCP regarding resuming Ozempic .  Jardiance  increased to 25 mg daily.  Constipation: Resolved  Vitamin D insufficiency: Vitamin D3  increased to 4000 units daily.    Planned Outpatient Follow-Up:   PCP PM&R Cardiologist   Rehab course: During patient's stay in rehab weekly team conferences were held to monitor patient's progress, set goals and discuss barriers to discharge. At admission, patient required Min A to CGA with mobility and basic ADLs.    Occupational Therapy: Patient has met all long term goals due to improved activity tolerance, improved balance, and ability to compensate for deficits.  Patient to discharge at overall modified independent level, with good understanding and adherence to precautions. Patient's care partner is independent to provide the necessary physical assistance at discharge. OT recommending continued assistance for higher-level IADLs due to sternal precautions. Supervision advised for tub/shower transfers due to lack of DME,   Physical Therapy: Patient has met all long term goals due to improved activity tolerance, improved balance, improved postural control, increased strength, decreased pain, ability to compensate for deficits, improved attention, improved awareness, and improved coordination.  Patient to discharge at an [place] level modified independent using walking stick.  He will benefit from ongoing skilled PT services in outpatient setting to continue to advance safe functional mobility, address ongoing impairments in strength, ROM, balance, dynamic  standing balance/coordination, and minimize fall risk.    Discharge plan was discussed with patient and/or family member and they verbalized understanding and agreed with it.      Disposition: Discharge disposition: 06-Home-Health Care Svc        Diet: Diabetic   Special Instructions:  -Sternal precautions   -No driving or operating heavy machinery until cleared by provider  -No smoking or alcohol or illicit drug use    Discharge Instructions     Ambulatory referral to Physical Medicine Rehab   Complete by: As directed       Allergies as of 11/20/2023       Reactions   Nubain [nalbuphine Hcl]    Muscle contraction        Medication List     PAUSE taking these medications    amitriptyline  50 MG tablet Wait to take this until your doctor or other care provider tells you to start again. Commonly known as: ELAVIL  Take 1 tablet (50 mg total) by mouth at bedtime.       STOP taking these medications    cyclobenzaprine  10 MG tablet Commonly known as: FLEXERIL    Lancets Misc. Misc   Melatonin 1 MG Caps   ondansetron  4 MG disintegrating tablet Commonly known as: ZOFRAN -ODT       TAKE these medications    amiodarone 200 MG tablet Commonly known as: PACERONE Take 1 tablet (200 mg total) by mouth 2 (two) times daily. What changed:  how much to take how to take this when to take this additional instructions   amLODipine  10 MG tablet Commonly known as: NORVASC  Take 1 tablet (10 mg total) by mouth daily. Start taking on: November 21, 2023   aspirin EC 81 MG tablet Take 1 tablet (81 mg total) by mouth daily. Swallow whole. Start taking on: November 21, 2023   B-complex with vitamin C tablet Take 1 tablet by mouth daily. Start taking on: November 21, 2023   bisacodyl  5 MG EC tablet Generic drug: bisacodyl  Take 1 tablet (5 mg total) by mouth daily as needed for moderate constipation.   Blood Glucose Monitoring Suppl Devi 1 each by Does not  apply route in the morning, at noon, and at bedtime. May substitute to any manufacturer covered by patient's insurance.  BLOOD GLUCOSE TEST STRIPS Strp 1 each by In Vitro route in the morning, at noon, and at bedtime. May substitute to any manufacturer covered by patient's insurance.   empagliflozin  10 MG Tabs tablet Commonly known as: JARDIANCE  Take 1 tablet (10 mg total) by mouth daily. Start taking on: November 21, 2023 What changed: when to take this   FreeStyle Libre 3 Plus Sensor Misc Change sensor every 15 days.   furosemide  40 MG tablet Commonly known as: LASIX  Take 1 tablet (40 mg total) by mouth daily. Start taking on: November 21, 2023 What changed: additional instructions   ipratropium-albuterol  0.5-2.5 (3) MG/3ML Soln Commonly known as: DUONEB Take 3 mLs by nebulization every 4 (four) hours as needed.   Lancet Device Misc 1 each by Does not apply route in the morning, at noon, and at bedtime. May substitute to any manufacturer covered by patient's insurance.   levETIRAcetam 500 MG tablet Commonly known as: KEPPRA Take 1 tablet (500 mg total) by mouth 2 (two) times daily.   magnesium  gluconate 500 (27 Mg) MG Tabs tablet Commonly known as: MAGONATE Take 0.5 tablets (250 mg total) by mouth at bedtime.   metoprolol  tartrate 50 MG tablet Commonly known as: LOPRESSOR  Take 1 tablet (50 mg total) by mouth 2 (two) times daily.   multivitamin with minerals Tabs tablet Take 1 tablet by mouth daily.   Nebulizer/Tubing/Mouthpiece Kit 1 each by Does not apply route every 4 (four) hours as needed. What changed: Another medication with the same name was removed. Continue taking this medication, and follow the directions you see here.   Oxycodone  HCl 10 MG Tabs Take 1 tablet (10 mg total) by mouth every 8 (eight) hours as needed.   Ozempic  (0.25 or 0.5 MG/DOSE) 2 MG/3ML Sopn Generic drug: Semaglutide (0.25 or 0.5MG /DOS) Inject 0.25 mg into the skin once a week.    pantoprazole  40 MG tablet Commonly known as: PROTONIX  Take 1 tablet (40 mg total) by mouth daily.   polyethylene glycol 17 g packet Commonly known as: MIRALAX  / GLYCOLAX  Take 17 g by mouth daily as needed for moderate constipation.   rOPINIRole  0.25 MG tablet Commonly known as: REQUIP  Take 1 tablet (0.25 mg total) by mouth 3 (three) times daily as needed (restless leg).   rosuvastatin  40 MG tablet Commonly known as: CRESTOR  TAKE 1 TABLET BY MOUTH DAILY. REPLACES ATORVASTATIN  (STOP ATORVASTATIN  IF STILL TAKING)   SUMAtriptan  50 MG tablet Commonly known as: IMITREX  TAKE 1 TABLET (50 MG TOTAL) BY MOUTH DAILY. MAY REPEAT IN 2 HOURS IF HEADACHE PERSISTS OR RECURS.   traZODone 100 MG tablet Commonly known as: DESYREL Take 1 tablet (100 mg total) by mouth at bedtime.   vitamin D3 25 MCG tablet Commonly known as: CHOLECALCIFEROL Take 3 tablets (3,000 Units total) by mouth daily. Start taking on: November 21, 2023        Follow-up Information     [name], MD Follow up.   Specialty: Internal Medicine Why: Call for an appointment. Contact information: [address] [place] [address] [phone]         Lorilee Sven SQUIBB, MD Follow up.   Specialty: Physical Medicine and Rehabilitation Why: Office will call for appointment Contact information: [address] [place] [address] [phone]                 [name], MD. Go on 11/23/2023.   Specialty: Cardiothoracic Surgery Why: Please arrive by 12:00 pm in order  to have PA/LAT CXR taken PRIOR to office appointment with Dr. Daniel. PA/LAT CXR is located on the SECOND floor in same building as Dr. Linward office. Appointment time is at 1:00 pm Contact information: [address]th [address] [place] [address] [phone]     [name], NP. Go on 12/04/2023.   Specialty: Cardiology Why: Appointment time is at 10:55 am Contact information: [address] [place] [address] [phone]  Signed: Daphne LOISE Satterfield 11/20/2023, 4:08 PM

## 2023-11-20 NOTE — Progress Notes (Signed)
 Patient ID: Michael Bender, male   DOB: 09-Jul-1945, 79 y.o.   MRN: [id number]  Spoke with patients sons, [name] who was agreeable with discharge plan for tomorrow. Would like to be set up for PT/OT at [facility] at [facility] Fax: [phone] - will send referral.   [name] will provide transportation home.

## 2023-11-20 NOTE — Progress Notes (Signed)
 Occupational Therapy Discharge Summary  Patient Details  Name: Michael Bender MRN: [id number] Date of Birth: 1945/02/11  Date of Discharge from OT service:November 20, 2023  Today's Date: 11/20/2023 OT Individual Time: 1005-1045 OT Individual Time Calculation (min): 40 min (15 missed minutes due to fatigue)   Patient has met 6 of 6 long term goals due to improved activity tolerance, improved balance, and ability to compensate for deficits.  Patient to discharge at overall Modified Independent level.  Patient's care partner is independent to provide the necessary physical and cognitive assistance at discharge.    Reasons goals not met: OT recommending continued assistance for higher-level IADLs due to sternal precautions. Supervision advised for tub/shower transfers due to lack of DME, see below.  Recommendation:  No follow-up OT reccomended  Equipment: Shower seat reccomended  Reasons for discharge: discharge from hospital  Patient/family agrees with progress made and goals achieved: Yes  OT Discharge Precautions/Restrictions  Precautions Precautions: Fall;Sternal Recall of Precautions/Restrictions: Intact Restrictions Weight Bearing Restrictions Per Provider Order: No Pain Pain Assessment Pain Scale: 0-10 Pain Score: 0-No pain ADL ADL Eating: Independent Where Assessed-Eating: Edge of bed Grooming: Modified independent Where Assessed-Grooming: Sitting at sink Upper Body Bathing: Modified independent Where Assessed-Upper Body Bathing: Shower Lower Body Bathing: Modified independent Where Assessed-Lower Body Bathing: Shower Upper Body Dressing: Modified independent (Device) Where Assessed-Upper Body Dressing: Edge of bed Lower Body Dressing: Modified independent Where Assessed-Lower Body Dressing: Edge of bed Toileting: Modified independent Where Assessed-Toileting: Teacher, adult education: Engineer, agricultural Method: Network engineer: Other (comment) (Walking stick.) Tub/Shower Transfer: Close supervison Web designer Method: Ship broker: Information systems manager without back Film/video editor: Modified independent Film/video editor Method: Designer, industrial/product: Other (comment) (Walking stick.) Vision Baseline Vision/History: 1 Wears glasses (Readers) Patient Visual Report: No change from baseline Vision Assessment?: Wears glasses for reading Perception  Perception: Within Functional Limits Praxis Praxis: WFL Cognition Cognition Overall Cognitive Status: Within Functional Limits for tasks assessed Arousal/Alertness: Awake/alert Brief Interview for Mental Status (BIMS) Repetition of Three Words (First Attempt): 3 Temporal Orientation: Year: Correct Temporal Orientation: Month: Accurate within 5 days Temporal Orientation: Day: Correct Recall: Sock: Yes, no cue required Recall: Blue: Yes, no cue required Recall: Bed: Yes, no cue required BIMS Summary Score: 15 Sensation Sensation Light Touch: Appears Intact Hot/Cold: Appears Intact Proprioception: Appears Intact Stereognosis: Not tested Coordination Gross Motor Movements are Fluid and Coordinated: No Fine Motor Movements are Fluid and Coordinated: Yes Coordination and Movement Description: Generalized weakness and deconditioning. Finger Nose Finger Test: [facility] bilaterally Motor  Motor Motor: Within Functional Limits Mobility  Transfers Sit to Stand: Independent with assistive device Stand to Sit: Independent with assistive device  Trunk/Postural Assessment  Cervical Assessment Cervical Assessment: Exceptions to [facility] (forward head) Thoracic Assessment Thoracic Assessment: Exceptions to [facility] (thoracic rounding) Lumbar Assessment Lumbar Assessment: Exceptions to [facility] (posterior pelvic tilt) Postural Control Postural Control: Within Functional Limits  Balance Balance Balance Assessed: Yes Static  Sitting Balance Static Sitting - Balance Support: No upper extremity supported Static Sitting - Level of Assistance: 7: Independent Dynamic Sitting Balance Dynamic Sitting - Balance Support: Feet supported;No upper extremity supported Dynamic Sitting - Level of Assistance: 6: Modified independent (Device/Increase time) Static Standing Balance Static Standing - Balance Support: During functional activity Static Standing - Level of Assistance: 6: Modified independent (Device/Increase time) Dynamic Standing Balance Dynamic Standing - Balance Support: During functional activity Dynamic Standing - Level of Assistance: 6: Modified independent (Device/Increase time) Extremity/Trunk Assessment RUE  Assessment RUE Assessment: Exceptions to [facility] Active Range of Motion (AROM) Comments: Limited by sternal precautions. General Strength Comments: Limited by sternal precautions. LUE Assessment LUE Assessment: Exceptions to [facility] Active Range of Motion (AROM) Comments: Limited by sternal precautions. General Strength Comments: Limited by sternal precautions.   Skilled Intervention:  Pt greeted resting in bed, increased encouragement to arouse (frustrated due to being woken up). No reports of pain, although demo's flat affect and only verbalizes short answers when speaking to therapist. WC transport to therapy location for energy management. In day room,  pt instructed in step overs as a means of dynamic standing balance challenge for carryover into LB ADLs, household threshold management, and decreasing fall risk. Pt completes 3x57min rep, stepping over exercise dowel, requiring CGA initially progressing to close supervision. In ADL apartment, patient completes tub/shower transfer with supervision, using tub edge for UE support. Educated on need for shower chair and provided with option to request through SW, patient stating . . . No I will get one. . . And . . . I will figure it out when I get home. Defers  need for BSC. Verbally reviewed energy conservation techniques for IADLs for simple meal prep and laundry. Pt completes simulated laundry task with close supervision. Handout provided for further education on energy conservation techniques. Resource provided for purchase of shower chair. Pt remained resting in bed with all immediate needs met.   Michael Bender, OTR/L, MSOT  11/20/2023, 10:58 AM

## 2023-11-20 NOTE — Plan of Care (Signed)
  Problem: Consults Goal: RH GENERAL PATIENT EDUCATION Description: See Patient Education module for education specifics. Outcome: Progressing   Problem: RH BOWEL ELIMINATION Goal: RH STG MANAGE BOWEL WITH ASSISTANCE Description: STG Manage Bowel with mod I Assistance. Outcome: Progressing   Problem: RH BLADDER ELIMINATION Goal: RH STG MANAGE BLADDER WITH ASSISTANCE Description: STG Manage Bladder With mod I Assistance Outcome: Progressing   Problem: RH SKIN INTEGRITY Goal: RH STG SKIN FREE OF INFECTION/BREAKDOWN Description: Manage skin free of infection with mod I assistance  Outcome: Progressing   Problem: RH SAFETY Goal: RH STG ADHERE TO SAFETY PRECAUTIONS W/ASSISTANCE/DEVICE Description: STG Adhere to Safety Precautions With mod I Assistance/Device. Outcome: Progressing   Problem: RH PAIN MANAGEMENT Goal: RH STG PAIN MANAGED AT OR BELOW PT'S PAIN GOAL Description: <4 w/ prns Outcome: Progressing   Problem: RH KNOWLEDGE DEFICIT GENERAL Goal: RH STG INCREASE KNOWLEDGE OF SELF CARE AFTER HOSPITALIZATION Description: Manage increase knowledge of self care after hospitalization with mod I assistance from sons using educational materials provided Outcome: Progressing   Problem: Education: Goal: Ability to describe self-care measures that may prevent or decrease complications (Diabetes Survival Skills Education) will improve Outcome: Progressing Goal: Individualized Educational Video(s) Outcome: Progressing   Problem: Coping: Goal: Ability to adjust to condition or change in health will improve Outcome: Progressing   Problem: Fluid Volume: Goal: Ability to maintain a balanced intake and output will improve Outcome: Progressing   Problem: Health Behavior/Discharge Planning: Goal: Ability to identify and utilize available resources and services will improve Outcome: Progressing Goal: Ability to manage health-related needs will improve Outcome: Progressing   Problem:  Metabolic: Goal: Ability to maintain appropriate glucose levels will improve Outcome: Progressing   Problem: Nutritional: Goal: Maintenance of adequate nutrition will improve Outcome: Progressing Goal: Progress toward achieving an optimal weight will improve Outcome: Progressing   Problem: Skin Integrity: Goal: Risk for impaired skin integrity will decrease Outcome: Progressing   Problem: Tissue Perfusion: Goal: Adequacy of tissue perfusion will improve Outcome: Progressing

## 2023-11-20 NOTE — Inpatient Diabetes Management (Addendum)
 Inpatient Diabetes Program Recommendations  AACE/ADA: New Consensus Statement on Inpatient Glycemic Control (2015)  Target Ranges:  Prepandial:   less than 140 mg/dL      Peak postprandial:   less than 180 mg/dL (1-2 hours)      Critically ill patients:  140 - 180 mg/dL   Lab Results  Component Value Date   GLUCAP 229 (H) 11/20/2023   HGBA1C 7.7 (H) 11/12/2023    Review of Glycemic Control  Latest Reference Range & Units 11/19/23 06:07 11/19/23 12:15 11/19/23 14:10 11/19/23 16:38 11/19/23 21:04 11/20/23 06:12 11/20/23 11:40  Glucose-Capillary 70 - 99 mg/dL 800 (H) 699 (H) 732 (H) 211 (H) 267 (H) 161 (H) 229 (H)  (H): Data is abnormally high  Diabetes history: DM2 Outpatient Diabetes medications: NONE Current orders for Inpatient glycemic control: Semglee  6 units TID, Novolog  0-9 units TID and 0-5 units   Received referral for DM management at home.  Met with patient at bedside.  He confirms he does not take any DM medications.  He states a couple of years ago he injected all of the contents of Ozempic  and was only supposed to take the starting dose.  He did not take Ozempic  after that.  He does not take Jardiance .  He has a glucometer and checks his glucose occasionally.  He drinks regular sodas but is willing to switch to diet beverages.  He states he has started drinking water recently.  He walks his dog, Buddy daily.  Reviewed basic pathophysiology of DM2.    Ordered the [facility] booklet.  Educated on The Plate Method, CHO's, portion control, avoiding caloric beverages, CBGs at home fasting and mid afternoon, F/U with PCP every 3 months, bring meter to PCP office, long and short term complications of uncontrolled BG, and importance of exercise.  Might consider for discharge:  Jardiance  10 mg every day Tradjenta 5 mg every day (excreted fecally)-asking for a benefit check  Would avoid Metformin  and sulfonylurea's due to stage III CKD   Thank you, Wyvonna Pinal, MSN, CDCES Diabetes  Coordinator Inpatient Diabetes Program [phone] (team pager from 8a-5p)

## 2023-11-20 NOTE — Plan of Care (Signed)
  Problem: RH Simple Meal Prep Goal: LTG Patient will perform simple meal prep w/assist (OT) Description: LTG: Patient will perform simple meal prep with assistance, with/without cues (OT). Outcome: Adequate for Discharge   Problem: RH Laundry Goal: LTG Patient will perform laundry w/assist, cues (OT) Description: LTG: Patient will perform laundry with assistance, with/without cues (OT). Outcome: Adequate for Discharge   Problem: RH Tub/Shower Transfers Goal: LTG Patient will perform tub/shower transfers w/assist (OT) Description: LTG: Patient will perform tub/shower transfers with assist, with/without cues using equipment (OT) Outcome: Adequate for Discharge   Problem: RH Awareness Goal: LTG: Patient will demonstrate awareness during functional activites type of (OT) Description: LTG: Patient will demonstrate awareness during functional activites type of (OT) Outcome: Adequate for Discharge   Problem: RH Grooming Goal: LTG Patient will perform grooming w/assist,cues/equip (OT) Description: LTG: Patient will perform grooming with assist, with/without cues using equipment (OT) Outcome: Completed/Met   Problem: RH Bathing Goal: LTG Patient will bathe all body parts with assist levels (OT) Description: LTG: Patient will bathe all body parts with assist levels (OT) Outcome: Completed/Met   Problem: RH Dressing Goal: LTG Patient will perform upper body dressing (OT) Description: LTG Patient will perform upper body dressing with assist, with/without cues (OT). Outcome: Completed/Met Goal: LTG Patient will perform lower body dressing w/assist (OT) Description: LTG: Patient will perform lower body dressing with assist, with/without cues in positioning using equipment (OT) Outcome: Completed/Met   Problem: RH Toileting Goal: LTG Patient will perform toileting task (3/3 steps) with assistance level (OT) Description: LTG: Patient will perform toileting task (3/3 steps) with assistance  level (OT)  Outcome: Completed/Met   Problem: RH Toilet Transfers Goal: LTG Patient will perform toilet transfers w/assist (OT) Description: LTG: Patient will perform toilet transfers with assist, with/without cues using equipment (OT) Outcome: Completed/Met

## 2023-11-20 NOTE — Discharge Instructions (Signed)
 Inpatient Rehab Discharge Instructions  Michael Bender  Discharge date and time: 11/21/23   Activities/Precautions/ Functional Status:  Activity: no lifting, driving, or strenuous exercise for until cleared by MD.   Diet: diabetic diet  Wound Care: keep wound clean and dry continue Sternal Precautions    Functional status:  ___ No restrictions     ___ Walk up steps independently ___ 24/7 supervision/assistance   ___ Walk up steps with assistance ___ Intermittent supervision/assistance  ___ Bathe/dress independently ___ Walk with walker     ___ Bathe/dress with assistance ___ Walk Independently    ___ Shower independently ___ Walk with assistance    ___ Shower with assistance __X_ No alcohol     ___ Return to work/school ________  Special Instructions:    My questions have been answered and I understand these instructions. I will adhere to these goals and the provided educational materials after my discharge from the hospital.  Patient/Caregiver Signature _______________________________ Date __________  Clinician Signature _______________________________________ Date __________  Please bring this form and your medication list with you to all your follow-up doctor's appointments. Inpatient Rehab Discharge Instructions

## 2023-11-20 NOTE — Progress Notes (Signed)
 PROGRESS NOTE   Subjective/Complaints: Continues to sleep poorly at night, would like trazodone to be increased CBGs very elevated over the weekend, diabetes coordinator was consulted  ROS: +insomnia   Objective:   No results found. Recent Labs    11/20/23 0644  WBC 9.0  HGB 10.5*  HCT 31.2*  PLT 376    Recent Labs    11/20/23 0644  NA 137  K 3.8  CL 104  CO2 23  GLUCOSE 170*  BUN 34*  CREATININE 1.80*  CALCIUM  8.3*     Intake/Output Summary (Last 24 hours) at 11/20/2023 1255 Last data filed at 11/20/2023 0735 Gross per 24 hour  Intake 660 ml  Output --  Net 660 ml        Physical Exam: Vital Signs Blood pressure (!) 144/63, pulse 76, temperature 98 F (36.7 C), resp. rate 19, height 5' 11 (1.803 m), weight 64.2 kg, SpO2 99%. Constitutional: No distress . Vital signs reviewed. BMI 19.47 HEENT: NCAT, EOMI, oral membranes moist Neck: supple Cardiovascular: RRR without murmur. No JVD    Respiratory/Chest: CTA Bilaterally without wheezes or rales. Normal effort    GI/Abdomen: BS +, non-tender, non-distended Ext: no clubbing, cyanosis, or edema Psych: pleasant and cooperative  Skin: sternal and abdominal incisions CDI, bruising in UEs  Neuro:     Mental Status: AAOx4, memory intact, Good insight and awareness Speech/Languate: Fluent, follows simple commands CRANIAL NERVES: II: PERRL. Visual fields full III, IV, VI: EOM intact, no gaze preference or deviation V: normal sensation bilaterally VII: no asymmetry VIII: normal hearing to speech IX, X: normal palatal elevation XI: 5/5 head turn and 5/5 shoulder shrug bilaterally XII: Tongue midline     MOTOR: RUE: at least 4/5 LUE:at least 4/5  RLE: HF 5/5, KE 5/5, ADF 5/5, APF 5/5 LLE: HF 5/5, KE 5/5, ADF 5/5, APF 5/5 Sternal precautions limits UE exam   REFLEXES: hyporeflexive b/l LE   SENSORY: Normal to touch all 4 extremities    Coordination: Normal finger to nose b/l, no tremor Prior neuro assessment is c/w 11/20/2023 exam.   Assessment/Plan: 1. Functional deficits which require 3+ hours per day of interdisciplinary therapy in a comprehensive inpatient rehab setting. Physiatrist is providing close team supervision and 24 hour management of active medical problems listed below. Physiatrist and rehab team continue to assess barriers to discharge/monitor patient progress toward functional and medical goals  Care Tool:  Bathing    Body parts bathed by patient: Right arm, Left arm, Abdomen, Front perineal area, Buttocks, Right upper leg, Left upper leg, Right lower leg, Left lower leg, Face, Chest     Body parts n/a: Chest (sternal incision)   Bathing assist Assist Level: Independent with assistive device Assistive Device Comment: seated, increased time   Upper Body Dressing/Undressing Upper body dressing   What is the patient wearing?: Pull over shirt    Upper body assist Assist Level: Independent with assistive device    Lower Body Dressing/Undressing Lower body dressing      What is the patient wearing?: Pants, Underwear/pull up     Lower body assist Assist for lower body dressing: Independent with assitive device  Toileting Toileting    Toileting assist Assist for toileting: Independent with assistive device     Transfers Chair/bed transfer  Transfers assist     Chair/bed transfer assist level: Independent with assistive device Chair/bed transfer assistive device: Geologist, engineering   Ambulation assist      Assist level: Independent with assistive device Assistive device: Other (comment) (walking stick) Max distance: >370ft   Walk 10 feet activity   Assist     Assist level: Independent with assistive device Assistive device: Other (comment) (walking stick)   Walk 50 feet activity   Assist    Assist level: Independent with assistive  device Assistive device: Other (comment) (walking stick)    Walk 150 feet activity   Assist    Assist level: Independent with assistive device Assistive device: Other (comment) (walking stick)    Walk 10 feet on uneven surface  activity   Assist     Assist level: Minimal Assistance - Patient > 75%     Wheelchair     Assist Is the patient using a wheelchair?: No Type of Wheelchair: Manual Wheelchair activity did not occur: N/A  Wheelchair assist level: Dependent - Patient 0% Max wheelchair distance: >129ft    Wheelchair 50 feet with 2 turns activity    Assist    Wheelchair 50 feet with 2 turns activity did not occur: N/A   Assist Level: Dependent - Patient 0%   Wheelchair 150 feet activity     Assist  Wheelchair 150 feet activity did not occur: N/A   Assist Level: Dependent - Patient 0%   Blood pressure (!) 144/63, pulse 76, temperature 98 F (36.7 C), resp. rate 19, height 5' 11 (1.803 m), weight 64.2 kg, SpO2 99%.  Medical Problem List and Plan: 1. Functional deficits secondary to debility due to severe aortic insufficiency s/p AVR. Pt with postoperative seizures and encephalopathy.  Sternal precautions.              -patient may shower, cover incisions please             -ELOS/Goals: 7 days, modI with PT/OT              Grounds pass ordered Vitamin D3/Metanx/Vitamin B/C complex ordered F/u with me or Fidela in clinic within 1 month Would benefit from home health aide upon discharge   -continue CIR therapies including PT, OT  2.  Antithrombotics: -DVT/anticoagulation:  Mechanical: Sequential compression devices, below knee Bilateral lower extremities Pharmaceutical:  Heparin              -antiplatelet therapy: Asprin  3. Pain Management: Oxycodone  prn--Headache: Imitrex  prn  4. Insomnia: melatonin, trazodone ineffective  10/19 seroquel  helped a bit, increase seroquel  to 25mg  at 2100  Increase Trazodone to 100mg  HS, patient states Seroquel   has not helped  5. Neuropsych/cognition: This patient is capable of making decisions on his own behalf. 6. Skin/Wound Care: Sternal Precautions- monitor chest tube sites and incisions for s/s of infection. 7. GERD: continue PPI  8. Acute on chronic diastolic HF: Lasix  40 mg daily.  Spironolactone  and Entresto  not resumed d/t creatinine.  Daily weights 9. AKI CKD IIIb: (Baseline 1.7-2) now 1.9. Monitor CMP  - Lasix  40 mg daily has been restarted 10. A. Fib: post op issue- now on Amiodarone 400 mg bid, Lopressor  50 mg bid and Amlodipine  10 mg. Monitor and supplement potassium as needed.  11. ABLA: post op acute anemia- H&H stable 10.2/31.1. Monitor CBC  12. T2DM: Hemoglobin A1c  7.7 11/12/23             -monitor CBGs ac/hs with SSI. Continue Jardiance    Diabetes coordinator consulted 13. Hx of seizures: Keppra 500 mg bid  14. Thrombocytopenia- monitor platelets currently 104k  15. COPD: stable on RA.  Duo neb prn and incentive spirometer  16. Constipation: Colace and Miralax  daily   -last BM 10/15  -changed colace to senna-s 2 at bedtime  LBM 10/19  17. HLD: continue Crestor  40 mg  18. RLS: continue Requip  0.25 mg prn    19. Vitamin D insufficiency: increase D3 to 3,000U daily      LOS: 5 days A FACE TO FACE EVALUATION WAS PERFORMED  Michael [name] Bender 11/20/2023, 12:55 PM

## 2023-11-21 ENCOUNTER — Other Ambulatory Visit (HOSPITAL_COMMUNITY): Payer: Self-pay

## 2023-11-21 DIAGNOSIS — R5381 Other malaise: Secondary | ICD-10-CM | POA: Diagnosis not present

## 2023-11-21 LAB — GLUCOSE, CAPILLARY: Glucose-Capillary: 208 mg/dL — ABNORMAL HIGH (ref 70–99)

## 2023-11-21 MED ORDER — EMPAGLIFLOZIN 25 MG PO TABS: 25.0000 mg | ORAL_TABLET | Freq: Every day | ORAL | Status: DC

## 2023-11-21 MED ORDER — VITAMIN D 25 MCG (1000 UNIT) PO TABS: 4000.0000 [IU] | ORAL_TABLET | Freq: Every day | ORAL | Status: DC

## 2023-11-21 MED ORDER — VITAMIN D3 25 MCG PO TABS
4000.0000 [IU] | ORAL_TABLET | Freq: Every day | ORAL | 0 refills | Status: AC
  Filled 2023-11-21: qty 120, 30d supply, fill #0

## 2023-11-21 MED ORDER — EMPAGLIFLOZIN 25 MG PO TABS
25.0000 mg | ORAL_TABLET | Freq: Every day | ORAL | 0 refills | Status: DC
  Filled 2023-11-21: qty 30, 30d supply, fill #0

## 2023-11-21 NOTE — Progress Notes (Addendum)
 Inpatient Rehabilitation Discharge Medication Review by a Pharmacist  A complete drug regimen review was completed for this patient to identify any potential clinically significant medication issues.  High Risk Drug Classes Is patient taking? Indication by Medication  Antipsychotic No   Anticoagulant No   Antibiotic No   Opioid Yes Oxycodone -pain  Antiplatelet Yes Aspirin-CAD  Hypoglycemics/insulin  Yes Ozempic  - DM  empagliflozin -DM, HF  Vasoactive Medication Yes Amlodipine -HTN Metoprolol -Afib, CAD, HF Furosemide -HF Amiodarone-Afib  Chemotherapy No   Other Yes Keppra-seizures MVI, b-complex vitamin, magnesium  gluconate, VitD-supplementation Rosuvastatin -HLD Trazodone - sleep PEG, Bisacodyl  - bowel regimen Duoneb - shortness of breath Pantoprazole  - GERD Ropinirole  - restless legs Sumatriptan  - migraines     Type of Medication Issue Identified Description of Issue Recommendation(s)  Drug Interaction(s) (clinically significant)     Duplicate Therapy     Allergy     No Medication Administration End Date     Incorrect Dose     Additional Drug Therapy Needed  Entresto /Aldactone  on hold for elevated Scr during acute admit Recommend restart for HF when/if appropriate  Significant med changes from prior encounter (inform family/care partners about these prior to discharge). PTA meds not resumed: Elavil , flexeril  Restart at discharge from CIR when appropriate.  Other       Clinically significant medication issues were identified that warrant physician communication and completion of prescribed/recommended actions by midnight of the next day:  No  Name of provider notified for urgent issues identified:   Provider Method of Notification:     Pharmacist comments:   Time spent performing this drug regimen review (minutes):  20  Lauren Modisette, Pharm.D., BCPS Clinical Pharmacist Clinical phone for 11/21/2023 from 7:30-3:00 is [phone].  **Pharmacist phone directory  can be found on [email or web address] listed under [facility].  11/21/2023 8:14 AM

## 2023-11-21 NOTE — Plan of Care (Signed)
 Continue current plan of care. Patient is expected to discharge 10/21.

## 2023-11-21 NOTE — Progress Notes (Signed)
 Patient ID: Michael Bender, male   DOB: 01/17/1946, 78 y.o.   MRN: [id number] Patient's care team made aware of insurance coverage through 11/20/23. Patient doing well post AVR with SVD and not a TAVR candidate with post op seizures and encephalopathy and would be ready for discharge on Tuesday 11/21/23.  Recommended OP follow up services. Sons to provide 24/7 support. [name]

## 2023-11-21 NOTE — Progress Notes (Signed)
 PROGRESS NOTE   Subjective/Complaints: Feels well Slept better last night CBGs still slightly above 300- increased Jardiance  to 25mg   ROS: +insomnia- improved   Objective:   No results found. Recent Labs    11/20/23 0644  WBC 9.0  HGB 10.5*  HCT 31.2*  PLT 376    Recent Labs    11/20/23 0644  NA 137  K 3.8  CL 104  CO2 23  GLUCOSE 170*  BUN 34*  CREATININE 1.80*  CALCIUM  8.3*     Intake/Output Summary (Last 24 hours) at 11/21/2023 0938 Last data filed at 11/21/2023 0724 Gross per 24 hour  Intake 1349 ml  Output --  Net 1349 ml        Physical Exam: Vital Signs Blood pressure 123/73, pulse 78, temperature (!) 97.5 F (36.4 C), resp. rate 18, height 5' 11 (1.803 m), weight 65.2 kg, SpO2 99%. Constitutional: No distress . Vital signs reviewed. BMI 20.05 HEENT: NCAT, EOMI, oral membranes moist Neck: supple Cardiovascular: RRR without murmur. No JVD    Respiratory/Chest: CTA Bilaterally without wheezes or rales. Normal effort    GI/Abdomen: BS +, non-tender, non-distended Ext: no clubbing, cyanosis, or edema Psych: pleasant and cooperative  Skin: sternal and abdominal incisions CDI, bruising in UEs  Neuro:     Mental Status: AAOx4, memory intact, Good insight and awareness Speech/Languate: Fluent, follows simple commands CRANIAL NERVES: II: PERRL. Visual fields full III, IV, VI: EOM intact, no gaze preference or deviation V: normal sensation bilaterally VII: no asymmetry VIII: normal hearing to speech IX, X: normal palatal elevation XI: 5/5 head turn and 5/5 shoulder shrug bilaterally XII: Tongue midline     MOTOR: RUE: at least 4/5 LUE:at least 4/5  RLE: HF 5/5, KE 5/5, ADF 5/5, APF 5/5 LLE: HF 5/5, KE 5/5, ADF 5/5, APF 5/5 Sternal precautions limits UE exam   REFLEXES: hyporeflexive b/l LE   SENSORY: Normal to touch all 4 extremities   Coordination: Normal finger to nose b/l,  no tremor Prior neuro assessment is c/w 11/21/2023 exam.   Assessment/Plan: 1. Functional deficits which require 3+ hours per day of interdisciplinary therapy in a comprehensive inpatient rehab setting. Physiatrist is providing close team supervision and 24 hour management of active medical problems listed below. Physiatrist and rehab team continue to assess barriers to discharge/monitor patient progress toward functional and medical goals  Care Tool:  Bathing    Body parts bathed by patient: Right arm, Left arm, Abdomen, Front perineal area, Buttocks, Right upper leg, Left upper leg, Right lower leg, Left lower leg, Face, Chest     Body parts n/a: Chest (sternal incision)   Bathing assist Assist Level: Independent with assistive device Assistive Device Comment: seated, increased time   Upper Body Dressing/Undressing Upper body dressing   What is the patient wearing?: Pull over shirt    Upper body assist Assist Level: Independent with assistive device    Lower Body Dressing/Undressing Lower body dressing      What is the patient wearing?: Pants, Underwear/pull up     Lower body assist Assist for lower body dressing: Independent with assitive device     Editor, commissioning  assist Assist for toileting: Independent with assistive device     Transfers Chair/bed transfer  Transfers assist     Chair/bed transfer assist level: Independent with assistive device Chair/bed transfer assistive device: Geologist, engineering   Ambulation assist      Assist level: Independent with assistive device Assistive device: Other (comment) (walking stick) Max distance: >364ft   Walk 10 feet activity   Assist     Assist level: Independent with assistive device Assistive device: Other (comment) (walking stick)   Walk 50 feet activity   Assist    Assist level: Independent with assistive device Assistive device: Other (comment) (walking stick)     Walk 150 feet activity   Assist    Assist level: Independent with assistive device Assistive device: Other (comment) (walking stick)    Walk 10 feet on uneven surface  activity   Assist     Assist level: Supervision/Verbal cueing Assistive device: Other (comment) (walking stick)   Wheelchair     Assist Is the patient using a wheelchair?: No Type of Wheelchair: Manual Wheelchair activity did not occur: N/A  Wheelchair assist level: Dependent - Patient 0% Max wheelchair distance: >186ft    Wheelchair 50 feet with 2 turns activity    Assist    Wheelchair 50 feet with 2 turns activity did not occur: N/A   Assist Level: Dependent - Patient 0%   Wheelchair 150 feet activity     Assist  Wheelchair 150 feet activity did not occur: N/A   Assist Level: Dependent - Patient 0%   Blood pressure 123/73, pulse 78, temperature (!) 97.5 F (36.4 C), resp. rate 18, height 5' 11 (1.803 m), weight 65.2 kg, SpO2 99%.  Medical Problem List and Plan: 1. Functional deficits secondary to debility due to severe aortic insufficiency s/p AVR. Pt with postoperative seizures and encephalopathy.  Sternal precautions.              -patient may shower, cover incisions please             -ELOS/Goals: 7 days, modI with PT/OT              Grounds pass ordered Vitamin D3/Metanx/Vitamin B/C complex ordered F/u with me or Fidela in clinic within 1 month Would benefit from home health aide upon discharge   -continue CIR therapies including PT, OT  2.  Antithrombotics: -DVT/anticoagulation:  Mechanical: Sequential compression devices, below knee Bilateral lower extremities Pharmaceutical:  Heparin              -antiplatelet therapy: Asprin  3. Pain Management: Oxycodone  prn--Headache: Imitrex  prn  4. Insomnia: melatonin, trazodone ineffective  10/19 seroquel  helped a bit, increase seroquel  to 25mg  at 2100  Increase Trazodone to 100mg  HS, patient states Seroquel  has not  helped  5. Neuropsych/cognition: This patient is capable of making decisions on his own behalf. 6. Skin/Wound Care: Sternal Precautions- monitor chest tube sites and incisions for s/s of infection. 7. GERD: continue PPI  8. Acute on chronic diastolic HF: Lasix  40 mg daily.  Spironolactone  and Entresto  not resumed d/t creatinine.  Daily weights 9. AKI CKD IIIb: (Baseline 1.7-2) now 1.9. Monitor CMP  - Lasix  40 mg daily has been restarted 10. A. Fib: post op issue- now on Amiodarone 400 mg bid, Lopressor  50 mg bid and Amlodipine  10 mg. Monitor and supplement potassium as needed.  11. ABLA: post op acute anemia- H&H stable 10.2/31.1. Monitor CBC  12. T2DM: Hemoglobin A1c 7.7 11/12/23             -  monitor CBGs ac/hs with SSI. Increase Jardiance  to 25mg  daily  Diabetes coordinator consulted 13. Hx of seizures: Keppra 500 mg bid  14. Thrombocytopenia- monitor platelets currently 104k  15. COPD: stable on RA.  Duo neb prn and incentive spirometer  16. Constipation: Colace and Miralax  daily   -last BM 10/15  -changed colace to senna-s 2 at bedtime  LBM 10/19  17. HLD: continue Crestor  40 mg   18. RLS: continue Requip  0.25 mg prn    19. Vitamin D insufficiency: increase D3 to 4,000U daily      LOS: 6 days A FACE TO FACE EVALUATION WAS PERFORMED  Sven SQUIBB Deep [place] 11/21/2023, 9:38 AM

## 2023-11-21 NOTE — Discharge Instr - Supplementary Instructions (Signed)
 PT/OT at [facility] at [facility] ([place])   Phone: [phone] Family to schedule appointment

## 2023-11-21 NOTE — Progress Notes (Signed)
 Inpatient Rehabilitation Care Coordinator Discharge Note   Patient Details  Name: Michael Bender MRN: [id number] Date of Birth: Mar 08, 1945   Discharge location: Home alone with sons providing 24/7 support  Length of Stay: 5 days  Discharge activity level: Independent with assistive device  Home/community participation: Limited activity within the community  Patient response un:Yzjouy Literacy - How often do you need to have someone help you when you read instructions, pamphlets, or other written material from your doctor or pharmacy?: Never  Patient response un:Dnrpjo Isolation - How often do you feel lonely or isolated from those around you?: Never  Services provided included: MD, RD, PT, OT, SLP, RN, CM, TR, Pharmacy, SW  Financial Services:  Field seismologist Utilized: Private Insurance [employer] MEDICARE / [employer] MEDICARE HMO/PPO  Choices offered to/list presented to: Patient/Family  Follow-up services arranged:  Outpatient    Outpatient Servicies: [facility]      Patient response to transportation need: Is the patient able to respond to transportation needs?: Yes In the past 12 months, has lack of transportation kept you from medical appointments or from getting medications?: No In the past 12 months, has lack of transportation kept you from meetings, work, or from getting things needed for daily living?: No   Patient/Family verbalized understanding of follow-up arrangements:  Yes  Individual responsible for coordination of the follow-up plan: Patients son, Michael Bender  Confirmed correct DME delivered: [name] 11/21/2023    Comments (or additional information): Patient is in good spirits to leave     [name]

## 2023-11-22 ENCOUNTER — Other Ambulatory Visit: Payer: Self-pay

## 2023-11-22 DIAGNOSIS — I351 Nonrheumatic aortic (valve) insufficiency: Secondary | ICD-10-CM

## 2023-11-23 ENCOUNTER — Inpatient Hospital Stay (INDEPENDENT_AMBULATORY_CARE_PROVIDER_SITE_OTHER)

## 2023-11-23 ENCOUNTER — Ambulatory Visit (HOSPITAL_COMMUNITY)
Admission: RE | Admit: 2023-11-23 | Discharge: 2023-11-23 | Disposition: A | Discharge status: Home / Self Care | Source: Ambulatory Visit | Attending: Internal Medicine | Admitting: Internal Medicine

## 2023-11-23 VITALS — BP 96/49 | HR 74 | Resp 20 | Ht 71.0 in | Wt 145.0 lb

## 2023-11-23 DIAGNOSIS — Z48812 Encounter for surgical aftercare following surgery on the circulatory system: Secondary | ICD-10-CM | POA: Diagnosis not present

## 2023-11-23 DIAGNOSIS — I351 Nonrheumatic aortic (valve) insufficiency: Secondary | ICD-10-CM | POA: Insufficient documentation

## 2023-11-23 DIAGNOSIS — Z953 Presence of xenogenic heart valve: Secondary | ICD-10-CM

## 2023-11-23 DIAGNOSIS — I700 Atherosclerosis of aorta: Secondary | ICD-10-CM | POA: Diagnosis not present

## 2023-11-23 DIAGNOSIS — I771 Stricture of artery: Secondary | ICD-10-CM | POA: Diagnosis not present

## 2023-11-23 NOTE — Progress Notes (Signed)
 [address] [place] [address]             [phone]       HPI:  Patient returns for routine postoperative follow-up having undergone AVR on 11/08/23.  He was transferred to CIR on POD 7.  He was discharged from CIR on Tuesday, and is now POD 15. The patient's early postoperative recovery while in the hospital was notable for AKI on CKD, but his Cre returned to baseline (1.8 - 2).  He also had seizure of unexplained origin on POD0, but did not have any additional events afterwards.  He was started on Keppra per neurology.  He also had very high blood glucose at CIR, but says his sugar has been 130-140 at home.  His BP is also low today - 96/49, 108/47 on recheck but he denies dizziness or headache. He is not taking any pain medicine.  He is having BM every couple days, does not feel he is constipated. He is not doing too much at home but has been walking around his house a little.  Weight today is 145 lbs, says it's 135 at home   Pain medication: None  Bowel function: Every other day   Allergies as of 11/23/2023       Reactions   Nubain [nalbuphine Hcl]    Muscle contraction        Medication List        Accurate as of November 23, 2023  1:16 PM. If you have any questions, ask your nurse or doctor.          PAUSE taking these medications    amitriptyline  50 MG tablet Wait to take this until your doctor or other care provider tells you to start again. Commonly known as: ELAVIL  Take 1 tablet (50 mg total) by mouth at bedtime.       TAKE these medications    amiodarone 200 MG tablet Commonly known as: PACERONE Take 1 tablet (200 mg total) by mouth 2 (two) times daily.   amLODipine  10 MG tablet Commonly known as: NORVASC  Take 1 tablet (10 mg total) by mouth daily.   aspirin EC 81 MG tablet Take 1 tablet (81 mg total) by mouth daily. Swallow whole.   B-complex with vitamin C tablet Take 1 tablet by mouth daily.   bisacodyl  5  MG EC tablet Generic drug: bisacodyl  Take 1 tablet (5 mg total) by mouth daily as needed for moderate constipation.   Blood Glucose Monitoring Suppl Devi 1 each by Does not apply route in the morning, at noon, and at bedtime. May substitute to any manufacturer covered by patient's insurance.   BLOOD GLUCOSE TEST STRIPS Strp 1 each by In Vitro route in the morning, at noon, and at bedtime. May substitute to any manufacturer covered by patient's insurance.   FreeStyle Libre 3 Plus Sensor Misc Change sensor every 15 days.   furosemide  40 MG tablet Commonly known as: LASIX  Take 1 tablet (40 mg total) by mouth daily.   ipratropium-albuterol  0.5-2.5 (3) MG/3ML Soln Commonly known as: DUONEB Take 3 mLs by nebulization every 4 (four) hours as needed.   Jardiance  25 MG Tabs tablet Generic drug: empagliflozin  Take 1 tablet (25 mg total) by mouth daily.   Lancet Device Misc 1 each by Does not apply route in the morning, at noon, and at bedtime. May substitute to any manufacturer covered by patient's insurance.   levETIRAcetam 500  MG tablet Commonly known as: KEPPRA Take 1 tablet (500 mg total) by mouth 2 (two) times daily.   Mag-G 500 (27 Mg) MG Tabs tablet Generic drug: magnesium  gluconate Take 0.5 tablets (250 mg total) by mouth at bedtime.   metoprolol  tartrate 50 MG tablet Commonly known as: LOPRESSOR  Take 1 tablet (50 mg total) by mouth 2 (two) times daily.   multivitamin with minerals Tabs tablet Take 1 tablet by mouth daily.   Nebulizer/Tubing/Mouthpiece Kit 1 each by Does not apply route every 4 (four) hours as needed.   Oxycodone  HCl 10 MG Tabs Take 1 tablet (10 mg total) by mouth every 8 (eight) hours as needed.   Ozempic  (0.25 or 0.5 MG/DOSE) 2 MG/3ML Sopn Generic drug: Semaglutide (0.25 or 0.5MG /DOS) Inject 0.25 mg into the skin once a week.   pantoprazole  40 MG tablet Commonly known as: PROTONIX  Take 1 tablet (40 mg total) by mouth daily.   polyethylene  glycol 17 g packet Commonly known as: MIRALAX  / GLYCOLAX  Take 17 g by mouth daily as needed for moderate constipation.   rOPINIRole  0.25 MG tablet Commonly known as: REQUIP  Take 1 tablet (0.25 mg total) by mouth 3 (three) times daily as needed (restless leg).   rosuvastatin  40 MG tablet Commonly known as: CRESTOR  TAKE 1 TABLET BY MOUTH DAILY. REPLACES ATORVASTATIN  (STOP ATORVASTATIN  IF STILL TAKING)   SUMAtriptan  50 MG tablet Commonly known as: IMITREX  TAKE 1 TABLET (50 MG TOTAL) BY MOUTH DAILY. MAY REPEAT IN 2 HOURS IF HEADACHE PERSISTS OR RECURS.   traZODone 100 MG tablet Commonly known as: DESYREL Take 1 tablet (100 mg total) by mouth at bedtime.   vitamin D3 25 MCG tablet Commonly known as: CHOLECALCIFEROL Take 4 tablets (4,000 Units total) by mouth daily.        BP (!) 96/49   Pulse 74   Resp 20   Ht 5' 11 (1.803 m)   Wt 145 lb (65.8 kg)   SpO2 96% Comment: RA  BMI 20.22 kg/m   Repeat 108/47  Physical Exam: General -  CV - Resp - Abd - Ext -    Imaging: CXR - Clear lung fields. No pleural effusion or pneumothorax.   Assessment/Plan: Michael Bender is a 78 year old man with severe AI who presented to the hospital in decompensated heart failure.  He underwent bioprosthetic AVR and LAAL on 10/8.  He went to inpatient rehab for 6 days and then was discharged home.   BP a little low today, but says it's usually higher at home.  Has been instructed to stop amitryptiline per CIR and will continue to check BP at home.  Encouraged him to continue checked BG regularly and to keep glucose < 180 to minimize infection risk.  Return to clinic on 11/13 for activity clearance and referral to cardiac rehab.  Con GORMAN Clunes, MD 1:16 PM 11/23/23

## 2023-11-27 ENCOUNTER — Ambulatory Visit (INDEPENDENT_AMBULATORY_CARE_PROVIDER_SITE_OTHER): Admitting: Internal Medicine

## 2023-11-27 ENCOUNTER — Encounter: Payer: Self-pay | Admitting: Internal Medicine

## 2023-11-27 VITALS — BP 104/52 | HR 71 | Temp 98.2°F | Ht 71.0 in | Wt 140.2 lb

## 2023-11-27 DIAGNOSIS — G8929 Other chronic pain: Secondary | ICD-10-CM

## 2023-11-27 DIAGNOSIS — Z79899 Other long term (current) drug therapy: Secondary | ICD-10-CM

## 2023-11-27 DIAGNOSIS — R54 Age-related physical debility: Secondary | ICD-10-CM

## 2023-11-27 DIAGNOSIS — G4700 Insomnia, unspecified: Secondary | ICD-10-CM

## 2023-11-27 DIAGNOSIS — R413 Other amnesia: Secondary | ICD-10-CM

## 2023-11-27 DIAGNOSIS — R4701 Aphasia: Secondary | ICD-10-CM

## 2023-11-27 DIAGNOSIS — K5909 Other constipation: Secondary | ICD-10-CM

## 2023-11-27 DIAGNOSIS — E1165 Type 2 diabetes mellitus with hyperglycemia: Secondary | ICD-10-CM | POA: Diagnosis not present

## 2023-11-27 DIAGNOSIS — Z8673 Personal history of transient ischemic attack (TIA), and cerebral infarction without residual deficits: Secondary | ICD-10-CM | POA: Diagnosis not present

## 2023-11-27 DIAGNOSIS — I499 Cardiac arrhythmia, unspecified: Secondary | ICD-10-CM

## 2023-11-27 DIAGNOSIS — R531 Weakness: Secondary | ICD-10-CM

## 2023-11-27 DIAGNOSIS — Z953 Presence of xenogenic heart valve: Secondary | ICD-10-CM | POA: Diagnosis not present

## 2023-11-27 DIAGNOSIS — E1122 Type 2 diabetes mellitus with diabetic chronic kidney disease: Secondary | ICD-10-CM

## 2023-11-27 DIAGNOSIS — M542 Cervicalgia: Secondary | ICD-10-CM

## 2023-11-27 DIAGNOSIS — R296 Repeated falls: Secondary | ICD-10-CM

## 2023-11-27 DIAGNOSIS — N183 Chronic kidney disease, stage 3 unspecified: Secondary | ICD-10-CM | POA: Diagnosis not present

## 2023-11-27 MED ORDER — GLIPIZIDE 5 MG PO TABS: 5.0000 mg | ORAL_TABLET | Freq: Two times a day (BID) | ORAL | 3 refills | Status: DC

## 2023-11-27 MED ORDER — TRULICITY 0.75 MG/0.5ML ~~LOC~~ SOAJ: 0.7500 mg | SUBCUTANEOUS | 2 refills | Status: AC

## 2023-11-27 MED ORDER — OXYCODONE HCL 10 MG PO TABS: 10.0000 mg | ORAL_TABLET | Freq: Three times a day (TID) | ORAL | 0 refills | Status: DC | PRN

## 2023-11-27 NOTE — Assessment & Plan Note (Signed)
 The condition was exacerbated by the recent aortic valve replacement, with hypotension likely due to heart strain post-surgery. Improvement in heart function is anticipated as recovery progresses. Monitor heart function and blood pressure. Encourage protein intake to support muscle maintenance and avoid strenuous activity to allow heart recovery.

## 2023-11-27 NOTE — Assessment & Plan Note (Signed)
 He underwent aortic valve replacement on November 15, 2023, with post-operative recovery marked by shortness of breath and hypotension. Heart strain was noted post-surgery, identified as muscle strain rather than myocardial infarction. Recovery is expected within three months, with full recovery in six months. Monitor blood pressure regularly. Stop amlodipine  and Lasix . Continue to hold Jardiance . Encourage adequate protein intake to maintain muscle mass and avoid strenuous activity until full recovery.

## 2023-11-27 NOTE — Assessment & Plan Note (Signed)
 Continue(s) with aspirin 81

## 2023-11-27 NOTE — Assessment & Plan Note (Signed)
  Lab Results  Component Value Date   EGFR 27 (L) 10/10/2023   EGFR 30 (L) 08/30/2023   EGFR 32 (L) 08/29/2023   EGFR 30 (L) 08/14/2023   EGFR 37 (L) 07/20/2023   EGFR 44 (L) 06/07/2023   EGFR 39 (L) 02/22/2023   EGFR 40 (L) 01/17/2023   EGFR 38 (L) 12/28/2022  Recent flare

## 2023-11-27 NOTE — Assessment & Plan Note (Signed)
 Type 2 diabetes mellitus   Elevated blood glucose levels are noted. Previous treatment with Ozempic  led to significant appetite suppression and weight loss. Transitioning to Trulicity to manage blood glucose with less impact on appetite and weight. Glipizide is added to increase insulin  production and lower blood glucose levels. Start Trulicity and glipizide. Monitor blood glucose levels regularly. Encourage dietary changes to increase protein intake and reduce sugar intake.

## 2023-11-27 NOTE — Assessment & Plan Note (Signed)
 Frailty and muscle weakness   Recent hospitalization and weight loss have exacerbated frailty and muscle weakness. Rehabilitation and increased protein intake are recommended to improve muscle strength. Outpatient rehab is pursued due to logistical challenges. Initiate physical therapy, ideally in a facility to prevent falls. Encourage protein intake to support muscle maintenance and consider inpatient rehab if outpatient appointments are challenging.  High risk of falls   Frailty, muscle weakness, and anticoagulant use increase his fall risk. He currently uses a walker at home. Emphasize fall prevention due to increased risk from anticoagulants. Encourage use of the walker and consider inpatient rehab to prevent falls and improve strength.

## 2023-11-27 NOTE — Assessment & Plan Note (Signed)
 Reported post-hospitalization. Previous use of amitriptyline  for sleep was paused due to hypotension concerns. Recent improvement in sleep with trazodone. Amitriptyline  is being restarted for sleep, with close monitoring of blood pressure. Restart amitriptyline  for sleep. Monitor blood pressure closely and consider alternative sleep aids if necessary.

## 2023-11-27 NOTE — Progress Notes (Signed)
 ==============================  [facility]: [phone]   -- Medical Office Visit --  Patient: Michael Bender      Age: 78 y.o.       Sex:  male  Date:   11/27/2023 Today's Healthcare Provider: Bernardino KANDICE Cone, MD  ==============================   Chief Complaint: Hospitalization Follow-up  Discussed the use of AI scribe software for clinical note transcription with the patient, who gave verbal consent to proceed.  History of Present Illness   78 year old male with a history of aortic valve replacement who presents with shortness of breath and post-operative recovery concerns.  He underwent an porcine aortic valve replacement on November 15, 2023, and was discharged on November 21, 2023. Since the surgery, he has experienced increased shortness of breath, contrary to expectations that the new valve would alleviate this symptom. Initially, he was disoriented post-surgery and had a right facial droop. The hospital staff were concerned about a possible stroke at the time. He is on cholesterol medication and aspirin 81.  He is with his son & caregiver who reports breathing / mobility / mental status all unchanged by the surgery; patient feels more shortness of breath than prior. Was able to get 40 feet before being winded and hypoxic prior.  He has a significant loss of appetite attributed to Ozempic , resulting in a weight drop to around 131-132 pounds during his hospital stay. His weight has since increased to approximately 139-140 pounds. He is interested in finding an alternative medication that manages blood sugar without suppressing appetite.  His frailty is a major concern.  He has had difficulty sleeping since his hospital discharge, with the first full night's sleep occurring only recently. He previously used amitriptyline  for sleep, which was paused due to low blood pressure concerns. His blood pressure has been low, leading to the discontinuation  of amlodipine  and amitriptyline . He is considering resuming amitriptyline  for sleep.  He uses a walker at home and reports no worsening in his walking ability since the hospital stay. He has a history of frequent falls, attributed to muscle weakness and low weight. He is working on dietary improvements to increase muscle mass and strength.  His blood sugar levels have been elevated, with a recent reading of 235 mg/dL. He is currently taking Lasix , which may be contributing to low blood pressure. He is also taking Protonix  and oxycodone , with a need for a refill on the latter.     Lab Results  Component Value Date   HGBA1C 7.7 (H) 11/12/2023   HGBA1C 8.7 (H) 11/02/2023   HGBA1C 8.4 (H) 10/12/2023   HGBA1C 6.5 (H) 11/08/2022   HGBA1C 6.4 05/03/2022   HGBA1C 5.1 11/20/2018   Background Reviewed: Problem List: has GERD; CONSTIPATION, CHRONIC; Primary hypertension; Bilateral leg pain; Esophageal dysphagia; Insomnia; Peripheral arterial disease; History of CVA (cerebrovascular accident); Anemia; HLD (hyperlipidemia); COPD (chronic obstructive pulmonary disease) (HCC); Iron deficiency anemia; Duodenal stenosis; Opioid dependence (HCC); Atherosclerosis of aorta; High risk medication use; Gynecomastia, male; Chronic bilateral low back pain with bilateral sciatica; Acute kidney injury superimposed on chronic kidney disease; Neck pain; Memory loss; Frequent falls; Nonintractable headache; Balance problems; Chronic heart failure with preserved ejection fraction (HFpEF, >= 50%) (HCC); Orthostatic hypotension; Stage 3 chronic kidney disease (HCC); Elevated BUN; Severe aortic regurgitation; Postlaminectomy syndrome, not elsewhere classified; Fecal incontinence alternating with constipation; Sleep disturbance; Diabetes mellitus, new onset (HCC); Chronic kidney disease, stage 4 (severe) (HCC); Restless legs; Hyperkalemia; SOB (shortness of breath); Protein-calorie malnutrition,  severe; S/P aortic valve replacement  with bioprosthetic valve; and Debility on their problem list. Past Medical History:  has a past medical history of Acute combined systolic and diastolic heart failure (HCC) (96/77/7974), Acute on chronic diastolic CHF (congestive heart failure) (HCC) (04/20/2023), AKI (acute kidney injury) (10/28/2022), Arthritis, Balance problems (02/21/2023), Blood transfusion, Cachexia (05/03/2022), Cervical discitis (11/10/2022), Chronic back pain, COPD (chronic obstructive pulmonary disease) (HCC), Disturbance of skin sensation (05/22/2018), Effusion of right knee (02/21/2023), Effusion of right knee joint (01/16/2023), Elevated troponin (04/22/2023), Essential hypertension (03/05/2007), Finger pain, left (11/08/2022), Gastric erosion, Gastritis and gastroduodenitis, Gastrointestinal hemorrhage with melena (11/20/2018), GIB (gastrointestinal bleeding) (07/18/2019), Hardware complicating wound infection (11/10/2022), Headache(784.0), Hemorrhoids, internal, High risk medication use (03/03/2022), History of duodenal ulcer, History of fusion of cervical spine (03/03/2022), History of lumbar fusion (03/03/2022), History of upper gastrointestinal bleeding (03/03/2022), HLD (hyperlipidemia), Hypertension, Hypokalemia (04/22/2023), Hypomagnesemia (04/22/2023), Infected blister of left index finger (11/07/2022), Intractable pain (03/03/2022), Loss of weight, MSSA bacteremia (11/07/2022), Neck rigidity, Nocturia, PAIN, CHRONIC NEC (10/06/2006), Pneumonia, Prostate disease, Right sided temporal headache (05/22/2018), S/P cervical spinal fusion (03/03/2022), Senile ecchymosis (01/21/2019), Septic arthritis of wrist, left (HCC) (11/08/2022), Septic infrapatellar bursitis of right knee (11/07/2022), Staphylococcal arthritis of left wrist (HCC) (11/07/2022), Staphylococcal arthritis of right knee (HCC) (11/08/2022), Syncope (11/05/2022), and Underweight on examination (05/03/2022). Past Surgical History:   has a past surgical history that  includes Cervical fusion (1992); Spinal fusion (05/06/11); Colonoscopy; Esophagogastroduodenoscopy (07/20/2011); Savory dilation (07/20/2011); biopsy (07/19/2019); Esophagogastroduodenoscopy (egd) with propofol  (N/A, 07/19/2019); I & D extremity (Left, 11/09/2022); TRANSESOPHAGEAL ECHOCARDIOGRAM (N/A, 07/24/2023); RIGHT/LEFT HEART CATH AND CORONARY ANGIOGRAPHY (N/A, 11/06/2023); Aortic valve replacement (N/A, 11/08/2023); Clipping of atrial appendage (N/A, 11/08/2023); and Intraoprative transesophageal echocardiogram (N/A, 11/08/2023). Social History:   reports that he quit smoking about 4 years ago. His smoking use included cigarettes. He started smoking about 44 years ago. He has never used smokeless tobacco. He reports that he does not currently use alcohol after a past usage of about 2.0 standard drinks of alcohol per week. He reports that he does not use drugs. Family History:  family history includes Heart attack (age of onset: 89) in his mother; Heart disease in his mother; Pancreatic cancer in his brother; Pancreatic cancer (age of onset: 89) in his father. Allergies:  is allergic to nubain [nalbuphine hcl].   Medication Reconciliation: Current Outpatient Medications on File Prior to Visit  Medication Sig   ACCU-CHEK GUIDE TEST test strip 1 each by Other route 4 (four) times daily.   aspirin EC 81 MG tablet Take 1 tablet (81 mg total) by mouth daily. Swallow whole.   B Complex-C (B-COMPLEX WITH VITAMIN C) tablet Take 1 tablet by mouth daily.   Blood Glucose Monitoring Suppl (ACCU-CHEK GUIDE ME) w/Device KIT Inject 1 Application into the skin 4 (four) times daily.   Blood Glucose Monitoring Suppl DEVI 1 each by Does not apply route in the morning, at noon, and at bedtime. May substitute to any manufacturer covered by patient's insurance.   Glucose Blood (BLOOD GLUCOSE TEST STRIPS) STRP 1 each by In Vitro route in the morning, at noon, and at bedtime. May substitute to any manufacturer covered by patient's  insurance.   ipratropium-albuterol  (DUONEB) 0.5-2.5 (3) MG/3ML SOLN Take 3 mLs by nebulization every 4 (four) hours as needed.   Lancet Device MISC 1 each by Does not apply route in the morning, at noon, and at bedtime. May substitute to any manufacturer covered by patient's insurance.   levETIRAcetam (KEPPRA) 500 MG tablet  Take 1 tablet (500 mg total) by mouth 2 (two) times daily.   magnesium  gluconate (MAGONATE) 500 (27 Mg) MG TABS tablet Take 0.5 tablets (250 mg total) by mouth at bedtime.   metoprolol  tartrate (LOPRESSOR ) 50 MG tablet Take 1 tablet (50 mg total) by mouth 2 (two) times daily.   Multiple Vitamin (MULTIVITAMIN WITH MINERALS) TABS tablet Take 1 tablet by mouth daily.   pantoprazole  (PROTONIX ) 40 MG tablet Take 1 tablet (40 mg total) by mouth daily.   polyethylene glycol (MIRALAX  / GLYCOLAX ) 17 g packet Take 17 g by mouth daily as needed for moderate constipation.   Respiratory Therapy Supplies (NEBULIZER/TUBING/MOUTHPIECE) KIT 1 each by Does not apply route every 4 (four) hours as needed.   rosuvastatin  (CRESTOR ) 40 MG tablet TAKE 1 TABLET BY MOUTH DAILY. REPLACES ATORVASTATIN  (STOP ATORVASTATIN  IF STILL TAKING)   SUMAtriptan  (IMITREX ) 50 MG tablet TAKE 1 TABLET (50 MG TOTAL) BY MOUTH DAILY. MAY REPEAT IN 2 HOURS IF HEADACHE PERSISTS OR RECURS.   traZODone (DESYREL) 100 MG tablet Take 1 tablet (100 mg total) by mouth at bedtime.   vitamin D3 (CHOLECALCIFEROL) 25 MCG tablet Take 4 tablets (4,000 Units total) by mouth daily.   amiodarone (PACERONE) 200 MG tablet Take 1 tablet (200 mg total) by mouth 2 (two) times daily.   [Paused] amitriptyline  (ELAVIL ) 50 MG tablet Take 1 tablet (50 mg total) by mouth at bedtime. (Patient not taking: Reported on 11/23/2023)   bisacodyl  5 MG EC tablet Take 1 tablet (5 mg total) by mouth daily as needed for moderate constipation.   Continuous Glucose Sensor (FREESTYLE LIBRE 3 PLUS SENSOR) MISC Change sensor every 15 days. (Patient not taking: Reported  on 11/27/2023)   empagliflozin  (JARDIANCE ) 25 MG TABS tablet Take 1 tablet (25 mg total) by mouth daily. (Patient not taking: Reported on 11/27/2023)   rOPINIRole  (REQUIP ) 0.25 MG tablet Take 1 tablet (0.25 mg total) by mouth 3 (three) times daily as needed (restless leg).   No current facility-administered medications on file prior to visit.   Medications Discontinued During This Encounter  Medication Reason   amLODipine  (NORVASC ) 10 MG tablet Completed Course   furosemide  (LASIX ) 40 MG tablet Completed Course   Semaglutide ,0.25 or 0.5MG /DOS, (OZEMPIC , 0.25 OR 0.5 MG/DOSE,) 2 MG/3ML SOPN    Oxycodone  HCl 10 MG TABS Reorder     Physical Exam:    11/27/2023    8:20 AM 11/23/2023   12:55 PM 11/21/2023    3:56 AM  Vitals with BMI  Height 5' 11 5' 11   Weight 140 lbs 3 oz 145 lbs 143 lbs 12 oz  BMI 19.56 20.23 20.06  Systolic 104 96   Diastolic 52 49   Pulse 71 74   Vital signs reviewed.  Nursing notes reviewed. Weight trend reviewed. Physical Activity: Insufficiently Active (10/13/2022)   Exercise Vital Sign    Days of Exercise per Week: 3 days    Minutes of Exercise per Session: 30 min   General Appearance:  No acute distress appreciable.   Well-groomed, healthy-appearing male.  Well proportioned with no abnormal fat distribution.  Good muscle tone. Pulmonary:  Normal work of breathing at rest, no respiratory distress apparent. SpO2: 98 %  Musculoskeletal: All extremities are intact.  Neurological:  Awake, alert, oriented, and engaged.  No obvious focal neurological deficits or cognitive impairments.  Sensorium seems unclouded.   Speech is clear and coherent with logical content. Psychiatric:  Appropriate mood, pleasant and cooperative demeanor, thoughtful and engaged during the  exam  In wheel chair.  Verbalized to patient: Physical Exam skip every 3rd beat that resolved by time of 12 lead EKG . Weaklegs, short of breath moving from wheelchair to bed and back.but normal work  of breathing at rest. Frail.  Awake, alert, and oriented but not very talkative. Hearing loss  Lab Results  Component Value Date   HGBA1C 7.7 (H) 11/12/2023   HGBA1C 8.7 (H) 11/02/2023   HGBA1C 8.4 (H) 10/12/2023    Lab Results  Component Value Date   CREATININE 1.80 (H) 11/20/2023   CREATININE 1.58 (H) 11/16/2023   CREATININE 1.90 (H) 11/15/2023   CREATININE 2.09 (H) 11/14/2023   CREATININE 2.15 (H) 11/13/2023   CREATININE 2.04 (H) 11/12/2023   CREATININE 2.10 (H) 11/11/2023   CREATININE 1.71 (H) 11/10/2023   CREATININE 1.82 (H) 11/09/2023   CREATININE 1.71 (H) 11/09/2023   CREATININE 1.70 (H) 11/08/2023   CREATININE 1.70 (H) 11/08/2023   CREATININE 1.70 (H) 11/08/2023   CREATININE 2.20 (H) 11/08/2023   CREATININE 2.20 (H) 11/08/2023   CREATININE 2.31 (H) 11/08/2023   CREATININE 2.55 (H) 11/07/2023   CREATININE 2.71 (H) 11/06/2023   CREATININE 2.87 (H) 11/05/2023   CREATININE 2.98 (H) 11/04/2023   CREATININE 2.96 (H) 11/04/2023   CREATININE 3.28 (H) 11/03/2023   CREATININE 3.07 (H) 11/02/2023   CREATININE 3.00 (H) 11/01/2023   CREATININE 2.38 (H) 10/10/2023   CREATININE 1.88 (H) 09/14/2023   CREATININE 2.21 (H) 08/30/2023   CREATININE 2.10 (H) 08/29/2023   CREATININE 2.18 (H) 08/14/2023   CREATININE 1.85 (H) 07/20/2023   CREATININE 1.77 (H) 06/29/2023   CREATININE 1.87 (H) 06/21/2023   CREATININE 1.60 (H) 06/07/2023   CREATININE 1.21 05/19/2023   CREATININE 1.30 (H) 05/01/2023   CREATININE 1.77 (H) 04/24/2023   CREATININE 1.72 (H) 04/23/2023   CREATININE 1.72 (H) 04/22/2023   CREATININE 1.56 (H) 04/21/2023   CREATININE 1.39 (H) 04/20/2023   CREATININE 1.77 (H) 02/22/2023   CREATININE 2.10 (H) 02/17/2023   CREATININE 1.71 (H) 01/17/2023   CREATININE 1.79 (H) 12/28/2022   CREATININE 1.28 (H) 11/14/2022   CREATININE 1.10 11/13/2022   CREATININE 1.41 (H) 11/12/2022   CREATININE 1.45 (H) 11/09/2022   CREATININE 1.27 (H) 11/09/2022   CREATININE 1.32 (H)  11/08/2022    Admission on 11/15/2023, Discharged on 11/21/2023  Component Date Value Ref Range Status   Sodium 11/16/2023 139  135 - 145 mmol/L Final   Potassium 11/16/2023 3.5  3.5 - 5.1 mmol/L Final   Chloride 11/16/2023 105  98 - 111 mmol/L Final   CO2 11/16/2023 22  22 - 32 mmol/L Final   Glucose, Bld 11/16/2023 158 (H)  70 - 99 mg/dL Final   BUN 89/83/7974 31 (H)  8 - 23 mg/dL Final   Creatinine, Ser 11/16/2023 1.58 (H)  0.61 - 1.24 mg/dL Final   Calcium  11/16/2023 8.2 (L)  8.9 - 10.3 mg/dL Final   Total Protein 89/83/7974 6.3 (L)  6.5 - 8.1 g/dL Final   Albumin 89/83/7974 2.8 (L)  3.5 - 5.0 g/dL Final   AST 89/83/7974 101 (H)  15 - 41 U/L Final   ALT 11/16/2023 34  0 - 44 U/L Final   Alkaline Phosphatase 11/16/2023 98  38 - 126 U/L Final   Total Bilirubin 11/16/2023 0.8  0.0 - 1.2 mg/dL Final   GFR, Estimated 11/16/2023 44 (L)  >60 mL/min Final   Anion gap 11/16/2023 12  5 - 15 Final   WBC 11/16/2023 6.5  4.0 - 10.5  K/uL Final   RBC 11/16/2023 4.00 (L)  4.22 - 5.81 MIL/uL Final   Hemoglobin 11/16/2023 11.6 (L)  13.0 - 17.0 g/dL Final   HCT 89/83/7974 36.1 (L)  39.0 - 52.0 % Final   MCV 11/16/2023 90.3  80.0 - 100.0 fL Final   MCH 11/16/2023 29.0  26.0 - 34.0 pg Final   MCHC 11/16/2023 32.1  30.0 - 36.0 g/dL Final   RDW 89/83/7974 16.7 (H)  11.5 - 15.5 % Final   Platelets 11/16/2023 164  150 - 400 K/uL Final   nRBC 11/16/2023 0.0  0.0 - 0.2 % Final   Neutrophils Relative % 11/16/2023 75  % Final   Neutro Abs 11/16/2023 4.8  1.7 - 7.7 K/uL Final   Lymphocytes Relative 11/16/2023 16  % Final   Lymphs Abs 11/16/2023 1.0  0.7 - 4.0 K/uL Final   Monocytes Relative 11/16/2023 8  % Final   Monocytes Absolute 11/16/2023 0.5  0.1 - 1.0 K/uL Final   Eosinophils Relative 11/16/2023 0  % Final   Eosinophils Absolute 11/16/2023 0.0  0.0 - 0.5 K/uL Final   Basophils Relative 11/16/2023 0  % Final   Basophils Absolute 11/16/2023 0.0  0.0 - 0.1 K/uL Final   Immature Granulocytes  11/16/2023 1  % Final   Abs Immature Granulocytes 11/16/2023 0.06  0.00 - 0.07 K/uL Final   Glucose-Capillary 11/15/2023 180 (H)  70 - 99 mg/dL Final   Glucose-Capillary 11/15/2023 260 (H)  70 - 99 mg/dL Final   Comment 1 89/84/7974 Notify RN   Final   Glucose-Capillary 11/16/2023 154 (H)  70 - 99 mg/dL Final   Comment 1 89/83/7974 Notify RN   Final   Vit D, 25-Hydroxy 11/16/2023 16.82 (L)  30 - 100 ng/mL Final   Magnesium  11/16/2023 2.1  1.7 - 2.4 mg/dL Final   Glucose-Capillary 11/16/2023 178 (H)  70 - 99 mg/dL Final   Glucose-Capillary 11/16/2023 227 (H)  70 - 99 mg/dL Final   Glucose-Capillary 11/16/2023 206 (H)  70 - 99 mg/dL Final   Glucose-Capillary 11/17/2023 128 (H)  70 - 99 mg/dL Final   Glucose-Capillary 11/17/2023 230 (H)  70 - 99 mg/dL Final   Glucose-Capillary 11/17/2023 299 (H)  70 - 99 mg/dL Final   Glucose-Capillary 11/17/2023 208 (H)  70 - 99 mg/dL Final   Glucose-Capillary 11/18/2023 167 (H)  70 - 99 mg/dL Final   Glucose-Capillary 11/18/2023 201 (H)  70 - 99 mg/dL Final   Glucose-Capillary 11/18/2023 484 (H)  70 - 99 mg/dL Final   Glucose-Capillary 11/18/2023 420 (H)  70 - 99 mg/dL Final   Glucose-Capillary 11/18/2023 147 (H)  70 - 99 mg/dL Final   Glucose-Capillary 11/19/2023 199 (H)  70 - 99 mg/dL Final   Glucose-Capillary 11/19/2023 300 (H)  70 - 99 mg/dL Final   Glucose-Capillary 11/19/2023 267 (H)  70 - 99 mg/dL Final   Glucose-Capillary 11/19/2023 211 (H)  70 - 99 mg/dL Final   Sodium 89/79/7974 137  135 - 145 mmol/L Final   Potassium 11/20/2023 3.8  3.5 - 5.1 mmol/L Final   Chloride 11/20/2023 104  98 - 111 mmol/L Final   CO2 11/20/2023 23  22 - 32 mmol/L Final   Glucose, Bld 11/20/2023 170 (H)  70 - 99 mg/dL Final   BUN 89/79/7974 34 (H)  8 - 23 mg/dL Final   Creatinine, Ser 11/20/2023 1.80 (H)  0.61 - 1.24 mg/dL Final   Calcium  11/20/2023 8.3 (L)  8.9 - 10.3 mg/dL  Final   GFR, Estimated 11/20/2023 38 (L)  >60 mL/min Final   Anion gap 11/20/2023 10  5 -  15 Final   WBC 11/20/2023 9.0  4.0 - 10.5 K/uL Final   RBC 11/20/2023 3.44 (L)  4.22 - 5.81 MIL/uL Final   Hemoglobin 11/20/2023 10.5 (L)  13.0 - 17.0 g/dL Final   HCT 89/79/7974 31.2 (L)  39.0 - 52.0 % Final   MCV 11/20/2023 90.7  80.0 - 100.0 fL Final   MCH 11/20/2023 30.5  26.0 - 34.0 pg Final   MCHC 11/20/2023 33.7  30.0 - 36.0 g/dL Final   RDW 89/79/7974 16.7 (H)  11.5 - 15.5 % Final   Platelets 11/20/2023 376  150 - 400 K/uL Final   nRBC 11/20/2023 0.0  0.0 - 0.2 % Final   Glucose-Capillary 11/19/2023 267 (H)  70 - 99 mg/dL Final   Glucose-Capillary 11/20/2023 161 (H)  70 - 99 mg/dL Final   Glucose-Capillary 11/20/2023 229 (H)  70 - 99 mg/dL Final   Glucose-Capillary 11/20/2023 167 (H)  70 - 99 mg/dL Final   Glucose-Capillary 11/20/2023 302 (H)  70 - 99 mg/dL Final   Glucose-Capillary 11/21/2023 208 (H)  70 - 99 mg/dL Final  No results displayed because visit has over 200 results.    Office Visit on 10/12/2023  Component Date Value Ref Range Status   Hgb A1c MFr Bld 10/12/2023 8.4 (H)  4.6 - 6.5 % Final   Islet Cell Ab 10/12/2023 Negative  Neg:<1:1 Final   Glutamic Acid Decarb Ab 10/12/2023 <5  <5 IU/mL Final   IA-2 Autoantibodies 10/12/2023 <7.5  U/mL Final   ZNT8 Antibodies 10/12/2023 <10  <15 U/mL Final  Office Visit on 10/10/2023  Component Date Value Ref Range Status   WBC 10/10/2023 7.2  3.4 - 10.8 x10E3/uL Final   RBC 10/10/2023 3.16 (L)  4.14 - 5.80 x10E6/uL Final   Hemoglobin 10/10/2023 9.7 (L)  13.0 - 17.7 g/dL Final   Hematocrit 90/90/7974 30.6 (L)  37.5 - 51.0 % Final   MCV 10/10/2023 97  79 - 97 fL Final   MCH 10/10/2023 30.7  26.6 - 33.0 pg Final   MCHC 10/10/2023 31.7  31.5 - 35.7 g/dL Final   RDW 90/90/7974 13.1  11.6 - 15.4 % Final   Platelets 10/10/2023 144 (L)  150 - 450 x10E3/uL Final   Glucose 10/10/2023 465 (H)  70 - 99 mg/dL Final   BUN 90/90/7974 38 (H)  8 - 27 mg/dL Final   Creatinine, Ser 10/10/2023 2.38 (H)  0.76 - 1.27 mg/dL Final   eGFR  90/90/7974 27 (L)  >59 mL/min/1.73 Final   BUN/Creatinine Ratio 10/10/2023 16  10 - 24 Final   Sodium 10/10/2023 132 (L)  134 - 144 mmol/L Final   Potassium 10/10/2023 5.4 (H)  3.5 - 5.2 mmol/L Final   Chloride 10/10/2023 100  96 - 106 mmol/L Final   CO2 10/10/2023 18 (L)  20 - 29 mmol/L Final   Calcium  10/10/2023 8.3 (L)  8.6 - 10.2 mg/dL Final  Office Visit on 09/25/2023  Component Date Value Ref Range Status   Amphetamines 09/25/2023 NEGATIVE  <500 ng/mL Final   Barbiturates 09/25/2023 NEGATIVE  <300 ng/mL Final   Benzodiazepines 09/25/2023 NEGATIVE  <100 ng/mL Final   Cocaine Metabolite 09/25/2023 NEGATIVE  <150 ng/mL Final   Marijuana Metabolite 09/25/2023 POSITIVE (A)  <20 ng/mL Final   Methadone Metabolite 09/25/2023 POSITIVE (A)  <100 ng/mL Final   Opiates 09/25/2023 POSITIVE (A)  <  100 ng/mL Final   Oxycodone  09/25/2023 POSITIVE (A)  <100 ng/mL Final   Creatinine 09/25/2023 84.9  > or = 20.0 mg/dL Final   pH 91/74/7974 6.7  4.5 - 9.0 Final   Oxidant 09/25/2023 NEGATIVE  <200 mcg/mL Final   Notes and Comments 09/25/2023    Final  Admission on 09/14/2023, Discharged on 09/14/2023  Component Date Value Ref Range Status   Sodium 09/14/2023 139  135 - 145 mmol/L Final   Potassium 09/14/2023 4.2  3.5 - 5.1 mmol/L Final   Chloride 09/14/2023 105  98 - 111 mmol/L Final   CO2 09/14/2023 22  22 - 32 mmol/L Final   Glucose, Bld 09/14/2023 148 (H)  70 - 99 mg/dL Final   BUN 91/85/7974 27 (H)  8 - 23 mg/dL Final   Creatinine, Ser 09/14/2023 1.88 (H)  0.61 - 1.24 mg/dL Final   Calcium  09/14/2023 8.8 (L)  8.9 - 10.3 mg/dL Final   Total Protein 91/85/7974 6.6  6.5 - 8.1 g/dL Final   Albumin 91/85/7974 3.7  3.5 - 5.0 g/dL Final   AST 91/85/7974 31  15 - 41 U/L Final   ALT 09/14/2023 10  0 - 44 U/L Final   Alkaline Phosphatase 09/14/2023 94  38 - 126 U/L Final   Total Bilirubin 09/14/2023 0.4  0.0 - 1.2 mg/dL Final   GFR, Estimated 09/14/2023 36 (L)  >60 mL/min Final   Anion gap  09/14/2023 12  5 - 15 Final   WBC 09/14/2023 5.9  4.0 - 10.5 K/uL Final   RBC 09/14/2023 3.24 (L)  4.22 - 5.81 MIL/uL Final   Hemoglobin 09/14/2023 9.9 (L)  13.0 - 17.0 g/dL Final   HCT 91/85/7974 30.0 (L)  39.0 - 52.0 % Final   MCV 09/14/2023 92.6  80.0 - 100.0 fL Final   MCH 09/14/2023 30.6  26.0 - 34.0 pg Final   MCHC 09/14/2023 33.0  30.0 - 36.0 g/dL Final   RDW 91/85/7974 14.0  11.5 - 15.5 % Final   Platelets 09/14/2023 115 (L)  150 - 400 K/uL Final   nRBC 09/14/2023 0.0  0.0 - 0.2 % Final   Glucose-Capillary 09/14/2023 146 (H)  70 - 99 mg/dL Final  Results Follow-Up on 08/30/2023  Component Date Value Ref Range Status   Glucose 08/30/2023 140 (H)  70 - 99 mg/dL Final   BUN 92/69/7974 35 (H)  8 - 27 mg/dL Final   Creatinine, Ser 08/30/2023 2.21 (H)  0.76 - 1.27 mg/dL Final   eGFR 92/69/7974 30 (L)  >59 mL/min/1.73 Final   BUN/Creatinine Ratio 08/30/2023 16  10 - 24 Final   Sodium 08/30/2023 137  134 - 144 mmol/L Final   Potassium 08/30/2023 5.4 (H)  3.5 - 5.2 mmol/L Final   Chloride 08/30/2023 103  96 - 106 mmol/L Final   CO2 08/30/2023 20  20 - 29 mmol/L Final   Calcium  08/30/2023 8.2 (L)  8.6 - 10.2 mg/dL Final  Office Visit on 08/29/2023  Component Date Value Ref Range Status   Glucose 08/29/2023 106 (H)  70 - 99 mg/dL Final   BUN 92/70/7974 32 (H)  8 - 27 mg/dL Final   Creatinine, Ser 08/29/2023 2.10 (H)  0.76 - 1.27 mg/dL Final   eGFR 92/70/7974 32 (L)  >59 mL/min/1.73 Final   BUN/Creatinine Ratio 08/29/2023 15  10 - 24 Final   Sodium 08/29/2023 135  134 - 144 mmol/L Final   Potassium 08/29/2023 5.4 (H)  3.5 - 5.2 mmol/L Final  Chloride 08/29/2023 101  96 - 106 mmol/L Final   CO2 08/29/2023 21  20 - 29 mmol/L Final   Calcium  08/29/2023 8.4 (L)  8.6 - 10.2 mg/dL Final   WBC 92/70/7974 6.6  3.4 - 10.8 x10E3/uL Final   RBC 08/29/2023 3.28 (L)  4.14 - 5.80 x10E6/uL Final   Hemoglobin 08/29/2023 10.3 (L)  13.0 - 17.7 g/dL Final   Hematocrit 92/70/7974 31.0 (L)  37.5 -  51.0 % Final   MCV 08/29/2023 95  79 - 97 fL Final   MCH 08/29/2023 31.4  26.6 - 33.0 pg Final   MCHC 08/29/2023 33.2  31.5 - 35.7 g/dL Final   RDW 92/70/7974 13.9  11.6 - 15.4 % Final   Platelets 08/29/2023 164  150 - 450 x10E3/uL Final  Office Visit on 08/14/2023  Component Date Value Ref Range Status   WBC 08/14/2023 8.1  3.4 - 10.8 x10E3/uL Final   RBC 08/14/2023 4.96  4.14 - 5.80 x10E6/uL Final   Hemoglobin 08/14/2023 14.8  13.0 - 17.7 g/dL Final   Hematocrit 92/85/7974 46.5  37.5 - 51.0 % Final   MCV 08/14/2023 94  79 - 97 fL Final   MCH 08/14/2023 29.8  26.6 - 33.0 pg Final   MCHC 08/14/2023 31.8  31.5 - 35.7 g/dL Final   RDW 92/85/7974 14.1  11.6 - 15.4 % Final   Platelets 08/14/2023 160  150 - 450 x10E3/uL Final   Neutrophils 08/14/2023 50  Not Estab. % Final   Lymphs 08/14/2023 32  Not Estab. % Final   Monocytes 08/14/2023 8  Not Estab. % Final   Eos 08/14/2023 9  Not Estab. % Final   Basos 08/14/2023 1  Not Estab. % Final   Neutrophils Absolute 08/14/2023 4.1  1.4 - 7.0 x10E3/uL Final   Lymphocytes Absolute 08/14/2023 2.6  0.7 - 3.1 x10E3/uL Final   Monocytes Absolute 08/14/2023 0.7  0.1 - 0.9 x10E3/uL Final   EOS (ABSOLUTE) 08/14/2023 0.7 (H)  0.0 - 0.4 x10E3/uL Final   Basophils Absolute 08/14/2023 0.1  0.0 - 0.2 x10E3/uL Final   Immature Granulocytes 08/14/2023 0  Not Estab. % Final   Immature Grans (Abs) 08/14/2023 0.0  0.0 - 0.1 x10E3/uL Final   Glucose 08/14/2023 94  70 - 99 mg/dL Final   BUN 92/85/7974 34 (H)  8 - 27 mg/dL Final   Creatinine, Ser 08/14/2023 2.18 (H)  0.76 - 1.27 mg/dL Final   eGFR 92/85/7974 30 (L)  >59 mL/min/1.73 Final   BUN/Creatinine Ratio 08/14/2023 16  10 - 24 Final   Sodium 08/14/2023 136  134 - 144 mmol/L Final   Potassium 08/14/2023 5.5 (H)  3.5 - 5.2 mmol/L Final   Chloride 08/14/2023 106  96 - 106 mmol/L Final   CO2 08/14/2023 18 (L)  20 - 29 mmol/L Final   Calcium  08/14/2023 8.0 (L)  8.6 - 10.2 mg/dL Final   Total Protein  08/14/2023 6.3  6.0 - 8.5 g/dL Final   Albumin 92/85/7974 3.6 (L)  3.8 - 4.8 g/dL Final   Globulin, Total 08/14/2023 2.7  1.5 - 4.5 g/dL Final   Bilirubin Total 08/14/2023 <0.2  0.0 - 1.2 mg/dL Final   Alkaline Phosphatase 08/14/2023 100  44 - 121 IU/L Final   AST 08/14/2023 40  0 - 40 IU/L Final   ALT 08/14/2023 20  0 - 44 IU/L Final   Retic Ct Pct 08/14/2023 1.1  0.6 - 2.6 % Final   Total Iron Binding Capacity 08/14/2023 203 (  L)  250 - 450 ug/dL Final   UIBC 92/85/7974 151  111 - 343 ug/dL Final   Iron 92/85/7974 52  38 - 169 ug/dL Final   Iron Saturation 08/14/2023 26  15 - 55 % Final   Ferritin 08/14/2023 216  30 - 400 ng/mL Final   Vitamin B-12 08/14/2023 348  232 - 1,245 pg/mL Final   Folate 08/14/2023 9.7  >3.0 ng/mL Final   Albumin ELP 08/14/2023 3.5  2.9 - 4.4 g/dL Final   Alpha 1 92/85/7974 0.2  0.0 - 0.4 g/dL Final   Alpha 2 92/85/7974 0.6  0.4 - 1.0 g/dL Final   Beta 92/85/7974 0.8  0.7 - 1.3 g/dL Final   Gamma Globulin 08/14/2023 1.2  0.4 - 1.8 g/dL Final   M-Spike, % 92/85/7974 Not Observed  Not Observed g/dL Final   Globulin, Total 08/14/2023 2.8  2.2 - 3.9 g/dL Final   A/G Ratio 92/85/7974 1.3  0.7 - 1.7 Final   Please Note: 08/14/2023 Comment   Final   Interpretation: 08/14/2023 Comment   Final   Haptoglobin 08/14/2023 172  34 - 355 mg/dL Final   Coombs', Direct 08/14/2023 Negative  Negative Final   Erythropoietin  08/14/2023 11.8  2.6 - 18.5 mIU/mL Final   PTH 08/14/2023 28  15 - 65 pg/mL Final   Phosphorus 08/14/2023 4.9 (H)  2.8 - 4.1 mg/dL Final   Cholesterol 92/85/7974 93  0 - 200 mg/dL Final   Triglycerides 92/85/7974 58.0  0.0 - 149.0 mg/dL Final   HDL 92/85/7974 46.00  >39.00 mg/dL Final   VLDL 92/85/7974 11.6  0.0 - 40.0 mg/dL Final   LDL Cholesterol 08/14/2023 36  0 - 99 mg/dL Final   Total CHOL/HDL Ratio 08/14/2023 2   Final   NonHDL 08/14/2023 47.33   Final  Admission on 07/24/2023, Discharged on 07/24/2023  Component Date Value Ref Range Status    Est EF 07/24/2023 50 - 55%   Final   Specimen Description 07/24/2023 BLOOD LEFT ARM   Final   Special Requests 07/24/2023 BOTTLES DRAWN AEROBIC AND ANAEROBIC Blood Culture adequate volume   Final   Culture 07/24/2023    Final                   Value:NO GROWTH 5 DAYS Performed at [facility], [address], [place], [place] [address]    Report Status 07/24/2023 07/29/2023 FINAL   Final   Specimen Description 07/24/2023 BLOOD RIGHT ARM   Final   Special Requests 07/24/2023 BOTTLES DRAWN AEROBIC AND ANAEROBIC Blood Culture adequate volume   Final   Culture 07/24/2023    Final                   Value:NO GROWTH 5 DAYS Performed at [facility], [address] [place], [place] [address]    Report Status 07/24/2023 07/29/2023 FINAL   Final  There may be more visits with results that are not included.  No image results found. 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 No focal airspace consolidation, pleural effusion, or pneumothorax. No cardiomegaly. Sternotomy wires and aortic valve replacement with left atrial appendage clip. Tortuous aorta with aortic atherosclerosis. No acute fracture or destructive lesions. Multilevel thoracic osteophytosis. IMPRESSION: No acute cardiopulmonary abnormality. Electronically Signed   By: Rogelia Myers  M.D.   On: 11/23/2023 13:17   DG Chest 2 View Result Date: 11/14/2023 CLINICAL DATA:  Status post cardiac surgery. EXAM: CHEST - 2 VIEW COMPARISON:  11/13/2023 FINDINGS: Lungs are hyperexpanded. No pneumothorax or substantial pleural effusion. No focal consolidation or evidence of pulmonary edema. The cardiopericardial silhouette is within normal limits for size. Right PICC line tip overlies the mid to lower SVC level. Telemetry leads overlie the chest. IMPRESSION: Hyperexpansion without acute  cardiopulmonary findings. Electronically Signed   By: Camellia Candle M.D.   On: 11/14/2023 05:44   DG CHEST PORT 1 VIEW Result Date: 11/13/2023 CLINICAL DATA:  Status post aortic valve replacement. EXAM: PORTABLE CHEST 1 VIEW COMPARISON:  Prior chest x-ray 11/11/2023 FINDINGS: The mediastinal drains have been removed. Patient is status post median sternotomy with evidence of left atrial appendage ligation and aortic valve replacement. Stable cardiac and mediastinal contours. Extensive atherosclerotic calcifications throughout the aorta. Right upper extremity PICC in good position with the tip overlying the mid SVC. The lungs are clear. No edema, pleural effusion or pneumothorax. No acute osseous abnormality. IMPRESSION: 1. Interval removal of mediastinal drains. 2. No acute cardiopulmonary process. 3. Well-positioned right upper extremity PICC with the catheter tip overlying the mid SVC. Electronically Signed   By: Wilkie Lent M.D.   On: 11/13/2023 09:41   DG CHEST PORT 1 VIEW Result Date: 11/11/2023 EXAM: 1 VIEW(S) XRAY OF THE CHEST 11/11/2023 01:29:00 PM COMPARISON: 11/10/2023 CLINICAL HISTORY: PICC (peripherally inserted central catheter) in place [id number]. PICC in place. FINDINGS: LINES, TUBES AND DEVICES: Right upper extremity PICC terminates in distal SVC. Mediastinal drains present. LUNGS AND PLEURA: No focal pulmonary opacity. No pulmonary edema. No pneumothorax identified. HEART AND MEDIASTINUM: Normal cardiac and mediastinal contours status post median sternotomy. Aortic valve replacement and left atrial appendage clip present. Aortic atherosclerosis noted. BONES AND SOFT TISSUES: Status post median sternotomy. IMPRESSION: 1. Right upper extremity PICC terminates in the distal  SVC. Electronically signed by: Waddell Calk MD 11/11/2023 01:43 PM EDT RP Workstation: [id number]   US  EKG SITE RITE Result Date: 11/11/2023 If Site Rite image not attached, placement could not be confirmed due to  current cardiac rhythm.  MR BRAIN WO CONTRAST Result Date: 11/10/2023 EXAM: MRI BRAIN WITHOUT CONTRAST 11/10/2023 02:25:37 PM TECHNIQUE: Multiplanar multisequence MRI of the head/brain was performed without the administration of intravenous contrast. COMPARISON: Head CT and CTA 11/09/2023 and MRI 02/17/2023. CLINICAL HISTORY: New-onset seizure, no history of trauma. FINDINGS: BRAIN AND VENTRICLES: No acute infarct, mass, midline shift, hydrocephalus, or extra-axial fluid collection. There is mild to moderate cerebral atrophy. Patchy to confluent T2 hyperintensities in the cerebral white matter bilaterally are similar to the prior MRI and are nonspecific but compatible with moderately severe chronic small vessel ischemic disease. Chronic infarcts are again seen in the basal ganglia, thalami, and cerebellum. Scattered chronic cerebral and cerebellar microhemorrhages are stable to minimally increased from the prior MRI. There are chronic blood products associated with the chronic infarct involving the left basal ganglia and corona radiata. The sella is unremarkable. Normal flow voids. ORBITS: Bilateral cataract extraction. SINUSES AND MASTOIDS: Chronic right sphenoid sinusitis. Clear mastoid air cells. BONES AND SOFT TISSUES: Normal marrow signal. No acute soft tissue abnormality. IMPRESSION: 1. No acute intracranial abnormality. 2. Moderately severe chronic small vessel ischemic disease. Electronically signed by: Dasie Hamburg MD 11/10/2023 02:34 PM EDT RP Workstation: [id number]   DG Chest Port 1 View Result Date: 11/10/2023 CLINICAL DATA:  Status post aortic valve replacement and left atrial  appendage clipping on 11/08/2023. EXAM: PORTABLE CHEST 1 VIEW COMPARISON:  11/09/2023 FINDINGS: Stable enlarged cardiac silhouette, mediastinal tubes, prosthetic aortic valve and left atrial clip. Tortuous and partially calcified thoracic aorta. Minimal left basilar atelectasis with improvement. Clear right lung.  Unremarkable bones. IMPRESSION: 1. Minimal left basilar atelectasis with improvement. 2. Stable cardiomegaly. Electronically Signed   By: Elspeth Bathe M.D.   On: 11/10/2023 08:24   DG Chest Port 1 View Result Date: 11/09/2023 CLINICAL DATA:  Status post aortic valve replacement and left atrial appendage clip placement on 11/08/2023. EXAM: PORTABLE CHEST 1 VIEW COMPARISON:  11/08/2023 FINDINGS: The cardiac silhouette remains borderline enlarged. Stable right jugular Swan-Ganz catheter with its tip in the main pulmonary artery segment or proximal right main pulmonary artery. The endotracheal and nasogastric tubes have been removed. Stable mediastinal tubes without pneumomediastinum. Interval mild-to-moderate left lower lobe atelectasis. Stable mild peribronchial thickening and chronic accentuation of the interstitial markings. Unremarkable bones. IMPRESSION: 1. Interval mild-to-moderate left lower lobe atelectasis. 2. Stable mild bronchitic changes. 3. Stable borderline cardiomegaly. Electronically Signed   By: Elspeth Bathe M.D.   On: 11/09/2023 11:06   Overnight EEG with video Result Date: 11/09/2023 Shelton Arlin KIDD, MD     11/13/2023  5:45 PM Patient Name: TERION HEDMAN MRN: [id number] Epilepsy Attending: Arlin KIDD Shelton Referring Physician/Provider: Khaliqdina, Salman, MD Duration: 11/09/2023 0530 to 11/10/2023 0344 Patient history: 78yo M with new onset seizure with Left arm and L leg and L face twitching with L gaze. Had a seizure lasting 1 mins, followed shortly by a second seizure. EEG to evaluate for seizure Level of alertness: comatose/ lethargic AEDs during EEG study: LEV, PHT, Ativan Technical aspects: This EEG study was done with scalp electrodes positioned according to the 10-20 International system of electrode placement. Electrical activity was reviewed with band pass filter of 1-70Hz , sensitivity of 7 uV/mm, display speed of 47mm/sec with a 60Hz  notched filter applied as appropriate. EEG data  were recorded continuously and digitally stored.  Video monitoring was available and reviewed as appropriate. Description: EEG showed continuous generalized and lateralized left hemisphere 3 to 6 Hz theta-delta slowing admixed with 15 to 18 Hz beta activity distributed symmetrically and diffusely. Hyperventilation and photic stimulation were not performed.   Patient pulled electrodes on 11/10/2023 at around 0344 and subsequently the study was not interpretable ABNORMALITY - Continuous slow, generalized and lateralized left hemisphere IMPRESSION: This study is suggestive of cortical dysfunction arising from left hemisphere likely secondary to underlying structural abnormality, post ictal state. Additionally there is generalized cerebral dysfunction/ encephalopath likely related to sedation. No seizures or epileptiform discharges were seen throughout the recording. Priyanka KIDD Shelton   CT ANGIO HEAD NECK W WO CM (CODE STROKE) Result Date: 11/09/2023 EXAM: CT HEAD WITHOUT CTA HEAD AND NECK WITH AND WITHOUT 11/09/2023 12:13:14 AM TECHNIQUE: CTA of the head and neck was performed with and without the administration of 75mL iohexol  (OMNIPAQUE ) 350 MG/ML injection. Noncontrast CT of the head with reconstructed 2-D images are also provided for review. Multiplanar 2D and/or 3D reformatted images are provided for review. Automated exposure control, iterative reconstruction, and/or weight based adjustment of the mA/kV was utilized to reduce the radiation dose to as low as reasonably achievable. COMPARISON: CT head from earlier today. CLINICAL HISTORY: Neuro deficit, acute, stroke suspected. FINDINGS: CT HEAD: BRAIN AND VENTRICLES: No acute intracranial hemorrhage. No mass effect. No midline shift. No extra-axial fluid collection. No evidence of acute infarct. No hydrocephalus. ORBITS: No acute abnormality. SINUSES AND  MASTOIDS: No acute abnormality. CTA NECK: AORTIC ARCH AND ARCH VESSELS: No dissection or arterial injury. No  significant stenosis of the brachiocephalic or subclavian arteries. CERVICAL CAROTID ARTERIES: Approximately 40% stenosis of the proximal left ICA due to atherosclerosis. No dissection. CERVICAL VERTEBRAL ARTERIES: Moderate narrowing of the left vertebral artery origin. No dissection, arterial injury, or significant stenosis in the right vertebral artery. LUNGS AND MEDIASTINUM: Subcutaneous emphysema tracking in the lower neck and chest wall without visible pneumothorax. SOFT TISSUES: Subcutaneous emphysema tracking in the lower neck and chest wall. BONES: No acute abnormality. CTA HEAD: ANTERIOR CIRCULATION: No significant stenosis of the internal carotid arteries. No significant stenosis of the anterior cerebral arteries. No significant stenosis of the middle cerebral arteries. No aneurysm. POSTERIOR CIRCULATION: No significant stenosis of the posterior cerebral arteries. No significant stenosis of the basilar artery. No significant stenosis of the vertebral arteries. No aneurysm. OTHER: No dural venous sinus thrombosis on this non-dedicated study. IMPRESSION: 1. No large vessel occlusion. 2. Approximately 40% stenosis of the proximal left ICA due to atherosclerosis. 3. Moderate stenosis at the left vertebral artery origin. 4. Subcutaneous emphysema in the lower neck and chest wall without visible pneumothorax. Electronically signed by: Gilmore Molt MD 11/09/2023 12:37 AM EDT RP Workstation: [id number]   CT HEAD CODE STROKE WO CONTRAST Result Date: 11/09/2023 EXAM: CT HEAD WITHOUT CONTRAST 11/09/2023 12:07:17 AM TECHNIQUE: CT of the head was performed without the administration of intravenous contrast. Automated exposure control, iterative reconstruction, and/or weight based adjustment of the mA/kV was utilized to reduce the radiation dose to as low as reasonably achievable. COMPARISON: None available. CLINICAL HISTORY: Neuro deficit, acute, stroke suspected. Stroke alert: Seizure of left, weak on the right;  Neuro: Sal: [phone] FINDINGS: BRAIN AND VENTRICLES: No acute hemorrhage. No evidence of acute infarct. No hydrocephalus. No extra-axial collection. No mass effect or midline shift. Remote left basal ganglia lacunar infarct. Patchy white matter hypodensities, compatible with chronic microvascular ischemic disease. ORBITS: No acute abnormality. SINUSES: No acute abnormality. SOFT TISSUES AND SKULL: No acute soft tissue abnormality. No skull fracture. Findings conveyed to Dr. Sal via pager at 12:19 PM. IMPRESSION: 1. No acute intracranial abnormality. 2. Remote left basal ganglia lacunar infarct. Electronically signed by: Gilmore Molt MD 11/09/2023 12:20 AM EDT RP Workstation: [id number]   DG Chest Port 1 View Result Date: 11/08/2023 CLINICAL DATA:  [id number] Pneumothorax [id number] EXAM: PORTABLE CHEST 1 VIEW COMPARISON:  Chest x-ray 11/01/2023 FINDINGS: Interval placement of an endotracheal tube with tip terminating approximately 4 5 cm above the carina. Enteric tube courses below the diaphragm with tip collimated off view and side port at the expected region of the gastroesophageal junction. Right internal jugular approach Swan-Ganz catheter with tip overlying the expected region of the proximal right main pulmonary artery. Couple mediastinal drains noted. The heart and mediastinal contours are within normal limits. Aortic valve replacement. Atrial appendage clip. Coarsened interstitial markings with no overt pulmonary edema. No focal consolidation. No pleural effusion. No pneumothorax. No acute osseous abnormality.  Sternotomy wires are intact. IMPRESSION: 1. No radiographic evidence of pneumothorax. 2. Lines and tubes as above. Electronically Signed   By: Morgane  Naveau M.D.   On: 11/08/2023 18:30   ECHO INTRAOPERATIVE TEE Result Date: 11/08/2023  *INTRAOPERATIVE TRANSESOPHAGEAL REPORT *  Patient Name:   GILLIAN KLUEVER Date of Exam: 11/08/2023 Medical Rec #:  [id number]        Height:       71.0 in Accession  #:    [id number]  Weight:       136.9 lb Date of Birth:  [birth year]       BSA:          1.79 m Patient Age:    77 years         BP:           124/57 mmHg Patient Gender: M                HR:           56 bpm. Exam Location:  Inpatient Transesophogeal exam was perform intraoperatively during surgical procedure. Patient was closely monitored under general anesthesia during the entirety of examination. Indications:     aortic valve replacement Performing Phys: [id number] CON RAMAN SU Diagnosing Phys: Kelly Mace MD Complications: No known complications during this procedure. POST-OP IMPRESSIONS Limited post- CPB exam: The patient separated easily from CPB _ Left Ventricle: The left ventricular function is essnetially unchanged from pre-bypass images. Overall fuction remains low normal. The initial septal flattening with dual chamber pacing resolved once Atrial pacing was successful. Overall EF 50%. _ Right Ventricle: The right ventricular function appears normal, unchanged from pre-bypass images. _ Aortic Valve: There has been interval replacement of the aortic valve. Artificial valve in the aortic position Manufactured by; [employer] Size; 27mm. There is no regurgitation, and no perivalvular leak. There is no stenosis, with peak gradient 7 mmHg, mean gradient 3 mmHg. _ Mitral Valve: The mitral valve function appears unchanged from pre-bypass images. There is mild Mitral regurgitation. _ Tricuspid Valve: The tricuspid valve function appears normal, unchanged from pre-bypass images -There has been interval cipping/closure of the Left Atrial Appendage. PRE-OP FINDINGS  Left Ventricle: The left ventricle has moderately reduced systolic function, with an ejection fraction of 35-40%, measured 40%. The cavity size was normal. Left ventrical global hypokinesis without regional wall motion abnormalities. There is no left ventricular hypertrophy. Left ventricular diastolic function was not evaluated. Right Ventricle: The  right ventricle has normal systolic function. The cavity was normal. There is no increase in right ventricular wall thickness. Catheter present in the right ventricle. Left Atrium: Left atrial size was normal in size. No left atrial/left atrial appendage thrombus was detected. Left atrial appendage velocity is normal at greater than 40 cm/s. Right Atrium: Right atrial size was normal in size. Catheter present in the right atrium. Interatrial Septum: No atrial level shunt detected by color flow Doppler. There is no evidence of a patent foramen ovale. Pericardium: There is no evidence of pericardial effusion. Mitral Valve: The mitral valve is normal in structure. Mitral valve regurgitation is not visualized by color flow Doppler. Pulmonary venous flow is normal. There is no evidence of mitral stenosis, with mean gradient 1 mmHg. Tricuspid Valve: The tricuspid valve was normal in structure. Tricuspid valve regurgitation is trivial by color flow Doppler. No evidence of tricuspid stenosis is present. There is no evidence of tricuspid valve vegetation. Aortic Valve: The aortic valve is tricuspid. Aortic valve regurgitation is severe by color flow Doppler. The jet is centrally-directed. There is no stenosis of the aortic valve, with mean gradient 4 mmHg, peak gradient 9 mmHg. There is no evidence of aortic valve vegetation. Pulmonic Valve: The pulmonic valve was normal in structure, with normal leaflet mobility and excursion. No evidence of pulmonic stenosis. Pulmonic valve regurgitation is trivial, around the PA catheter, by color flow Doppler. Aorta: The aortic root and ascending aorta are normal in size and structure. The aortic arch was not well visualized.  There is evidence of plaque in the descending aorta; Grade I, measuring 1-26mm in size. The LVOT measures 2.3 cm diameter, ascending aorta measures 3.8 cm. Pulmonary Artery: Norva Purl catheter present on the left. The pulmonary artery is of normal size. Venous: The  inferior vena cava was not well visualized. +-------------+--------++ AORTIC VALVE          +-------------+--------++ AV Mean Grad:4.0 mmHg +-------------+--------++ +-------------+--------++ MITRAL VALVE          +-------------+--------++ MV Mean grad:1.0 mmHg +-------------+--------++  Kelly Mace MD Electronically signed by Kelly Mace MD Signature Date/Time: 11/08/2023/5:47:21 PM    Final    VAS US  UPPER EXTREMITY ARTERIAL DUPLEX Result Date: 11/07/2023  UPPER EXTREMITY DUPLEX STUDY Patient Name:  DSHAWN MCNAY  Date of Exam:   11/07/2023 Medical Rec #: [id number]         Accession #:    [id number] Date of Birth: April 29, 1945        Patient Gender: M Patient Age:   26 years Exam Location:  [facility] Procedure:      VAS US  UPPER EXTREMITY ARTERIAL DUPLEX Referring Phys: REDIA CLEAVER --------------------------------------------------------------------------------  Indications: Wrist pain, bruising, palpable knot and Pain right antecubital              fossa area. History:     Patient has a history of catheterization via right radial artery.  Risk Factors: Hypertension, hyperlipidemia. Performing Technologist: Ricka Sturdivant-Jones RDMS, RVT  Examination Guidelines: A complete evaluation includes B-mode imaging, spectral Doppler, color Doppler, and power Doppler as needed of all accessible portions of each vessel. Bilateral testing is considered an integral part of a complete examination. Limited examinations for reoccurring indications may be performed as noted.  Right Doppler Findings: +---------------+----------+---------+--------+--------+ Site           PSV (cm/s)Waveform StenosisComments +---------------+----------+---------+--------+--------+ Subclavian Dist          triphasic                 +---------------+----------+---------+--------+--------+ Brachial Prox  82        triphasic                  +---------------+----------+---------+--------+--------+ Brachial Dist  154       triphasic                 +---------------+----------+---------+--------+--------+ Radial Prox    138       triphasic                 +---------------+----------+---------+--------+--------+ Radial Mid     125       triphasic                 +---------------+----------+---------+--------+--------+ Radial Dist    108       triphasic                 +---------------+----------+---------+--------+--------+ Ulnar Dist     122       triphasic                 +---------------+----------+---------+--------+--------+    Summary:  Right: No evidence of right radial pseudoaneurysm or AVF. Patent        brachial artery. *See table(s) above for measurements and observations. Electronically signed by Debby Robertson on 11/07/2023 at 4:57:49 PM.    Final    CARDIAC CATHETERIZATION Result Date: 11/06/2023 1.  Patent coronary arteries with mild diffuse calcific plaquing but no significant stenoses 2.  Known severe aortic insufficiency with wide pulse  pressure noted 3.  Right heart data: RA mean 3 mmHg RV 30/5 mmHg PA 30/14 mean 20 mmHg Pulmonary wedge pressure 13 mmHg (A-wave 12, V wave 15) LVEDP 9 mmHg Cardiac output/index by Fick measurement 6.1/3.4   VAS US  DOPPLER PRE CABG Result Date: 11/06/2023 PREOPERATIVE VASCULAR EVALUATION Patient Name:  JARRETT ALBOR  Date of Exam:   11/04/2023 Medical Rec #: [id number]         Accession #:    [id number] Date of Birth: 07-16-1945        Patient Gender: M Patient Age:   81 years Exam Location:  [facility] Procedure:      VAS US  DOPPLER PRE CABG Referring Phys: BAILEY CHAMBERS --------------------------------------------------------------------------------  Indications:      Pre-CABG. Risk Factors:     Hypertension, hyperlipidemia. Comparison Study: No prior studies. Performing Technologist: Gerome Ny RVT  Examination Guidelines: A complete evaluation includes  B-mode imaging, spectral Doppler, color Doppler, and power Doppler as needed of all accessible portions of each vessel. Bilateral testing is considered an integral part of a complete examination. Limited examinations for reoccurring indications may be performed as noted.  Right Carotid Findings: +----------+--------+-------+--------+--------------------------------+--------+           PSV cm/sEDV    StenosisDescribe                        Comments                   cm/s                                                    +----------+--------+-------+--------+--------------------------------+--------+ CCA Prox  151     18             smooth, heterogenous and                                                  calcific                                 +----------+--------+-------+--------+--------------------------------+--------+ CCA Distal90      12             smooth and heterogenous                  +----------+--------+-------+--------+--------------------------------+--------+ ICA Prox  85      17             irregular and heterogenous               +----------+--------+-------+--------+--------------------------------+--------+ ICA Mid   80      16                                             tortuous +----------+--------+-------+--------+--------------------------------+--------+ ICA Distal77      13  tortuous +----------+--------+-------+--------+--------------------------------+--------+ ECA       133     3                                                       +----------+--------+-------+--------+--------------------------------+--------+ +----------+--------+-------+--------+------------+           PSV cm/sEDV cmsDescribeArm Pressure +----------+--------+-------+--------+------------+ Subclavian84                                  +----------+--------+-------+--------+------------+  +---------+--------+--+--------+--+---------+ VertebralPSV cm/s49EDV cm/s12Antegrade +---------+--------+--+--------+--+---------+ Left Carotid Findings: +----------+--------+--------+--------+-----------------------+--------+           PSV cm/sEDV cm/sStenosisDescribe               Comments +----------+--------+--------+--------+-----------------------+--------+ CCA Prox  95      11                                              +----------+--------+--------+--------+-----------------------+--------+ CCA Distal110     9               smooth and heterogenous         +----------+--------+--------+--------+-----------------------+--------+ ICA Prox  128     20              smooth and heterogenous         +----------+--------+--------+--------+-----------------------+--------+ ICA Mid   132     21                                              +----------+--------+--------+--------+-----------------------+--------+ ICA Distal144     17                                     tortuous +----------+--------+--------+--------+-----------------------+--------+ ECA       103     1                                               +----------+--------+--------+--------+-----------------------+--------+ +----------+--------+--------+--------+------------+ SubclavianPSV cm/sEDV cm/sDescribeArm Pressure +----------+--------+--------+--------+------------+           92                                   +----------+--------+--------+--------+------------+ +---------+--------+--+--------+-+---------+ VertebralPSV cm/s55EDV cm/s7Antegrade +---------+--------+--+--------+-+---------+  ABI Findings: +------------------+-----+-----------+ Rt Pressure (mmHg)IndexWaveform    +------------------+-----+-----------+ 124                    triphasic   +------------------+-----+-----------+ 187               1.51 multiphasic +------------------+-----+-----------+ 161                1.30 multiphasic +------------------+-----+-----------+ +------------------+-----+----------+ Lt Pressure (mmHg)IndexWaveform   +------------------+-----+----------+ 118  triphasic  +------------------+-----+----------+ 254               2.05 monophasic +------------------+-----+----------+ 58                0.47 monophasic +------------------+-----+----------+ +-------+---------------+ ABI/TBIToday's ABI/TBI +-------+---------------+ Right  1.51            +-------+---------------+ Left   0.47            +-------+---------------+  Right Doppler Findings: +--------+--------+---------+ Site    PressureDoppler   +--------+--------+---------+ [id number]     triphasic +--------+--------+---------+ Radial          triphasic +--------+--------+---------+ Ulnar           triphasic +--------+--------+---------+  Left Doppler Findings: +--------+--------+---------+ Site    PressureDoppler   +--------+--------+---------+ [id number]     triphasic +--------+--------+---------+ Radial          triphasic +--------+--------+---------+ Ulnar           triphasic +--------+--------+---------+   Summary: Right Carotid: Velocities in the right ICA are consistent with a 1-39% stenosis. Left Carotid: Velocities in the left ICA are consistent with a 1-39% stenosis. Vertebrals: Bilateral vertebral arteries demonstrate antegrade flow. Right ABI: Resting right ankle-brachial index indicates noncompressible right lower extremity arteries. Left ABI: Resting left ankle-brachial index indicates severe left lower extremity arterial disease. Right Upper Extremity: Doppler waveform obliterate with right radial compression. Doppler waveform obliterate with right ulnar compression. Left Upper Extremity: Doppler waveform obliterate with left radial compression. Doppler waveforms decrease >50% with left ulnar compression.  Electronically signed by Gaile New  MD on 11/06/2023 at 8:16:53 AM.    Final    ECHOCARDIOGRAM COMPLETE Result Date: 11/03/2023    ECHOCARDIOGRAM REPORT   Patient Name:   JONANTHONY NAHAR Date of Exam: 11/02/2023 Medical Rec #:  [id number]        Height:       71.0 in Accession #:    [id number]       Weight:       153.2 lb Date of Birth:  [birth year]       BSA:          1.883 m Patient Age:    58 years         BP:           112/64 mmHg Patient Gender: M                HR:           74 bpm. Exam Location:  Inpatient Procedure: 2D Echo, Cardiac Doppler and Color Doppler (Both Spectral and Color            Flow Doppler were utilized during procedure).                                 MODIFIED REPORT:  This report was modified by Vinie Maxcy MD on 11/03/2023 due to Re-evaluated           aortic insufficiency in light of more recent TEE comparison.  Indications:     Aortic regurgitation  History:         Patient has prior history of Echocardiogram examinations, most                  recent 04/21/2023. CHF, COPD, Signs/Symptoms:Shortness of                  Breath; Risk Factors:Hypertension and Former Smoker.  Sonographer:     Juliene Rucks Referring Phys:  [id number] ZANE ADAMS Diagnosing Phys: Vinie Maxcy MD  Sonographer Comments: Technically difficult study due to poor echo windows. Image acquisition challenging due to COPD and Image acquisition challenging due to respiratory motion. IMPRESSIONS  1. Technically difficult study, images off axis  2. Left ventricular ejection fraction, by estimation, is 60 to 65%. Left ventricular ejection fraction by PLAX is 61 %. The left ventricle has normal function. The left ventricle has no regional wall motion abnormalities. Left ventricular diastolic parameters are consistent with Grade I diastolic dysfunction (impaired relaxation).  3. Right ventricular systolic function is normal. The right ventricular size is normal.  4. The mitral valve is normal in structure. No evidence of mitral valve regurgitation.  5.  Calcified aortic root. There appears to be eccentric, posteriorly directed regurgitation (worst on image 77). The aortic valve was not well visualized. Aortic valve regurgitation is moderate. Aortic valve sclerosis/calcification is present, without any evidence of aortic stenosis. Comparison(s): Changes from prior study are noted. 07/24/2023: LVEF 50-55%, severe AI with oscillating density on the left coronary cusp. FINDINGS  Left Ventricle: Left ventricular ejection fraction, by estimation, is 60 to 65%. Left ventricular ejection fraction by PLAX is 61 %. The left ventricle has normal function. The left ventricle has no regional wall motion abnormalities. The left ventricular internal cavity size was normal in size. There is no left ventricular hypertrophy. Left ventricular diastolic parameters are consistent with Grade I diastolic dysfunction (impaired relaxation). Indeterminate filling pressures. Right Ventricle: The right ventricular size is normal. No increase in right ventricular wall thickness. Right ventricular systolic function is normal. Left Atrium: Left atrial size was normal in size. Right Atrium: Right atrial size was normal in size. Pericardium: There is no evidence of pericardial effusion. Mitral Valve: The mitral valve is normal in structure. No evidence of mitral valve regurgitation. Tricuspid Valve: The tricuspid valve is not well visualized. Tricuspid valve regurgitation is not demonstrated. Aortic Valve: Calcified aortic root. There appears to be eccentric, posteriorly directed regurgitation (worst on image 77). The aortic valve was not well visualized. There is moderate aortic valve annular calcification. Aortic valve regurgitation is moderate. Aortic valve sclerosis/calcification is present, without any evidence of aortic stenosis. Pulmonic Valve: The pulmonic valve was normal in structure. Pulmonic valve regurgitation is not visualized. Aorta: The aortic root and ascending aorta are  structurally normal, with no evidence of dilitation. Venous: The inferior vena cava was not well visualized. IAS/Shunts: The interatrial septum was not well visualized.  LEFT VENTRICLE PLAX 2D LV EF:         Left            Diastology                ventricular     LV e' medial:    4.97 cm/s                ejection        LV E/e' medial:  7.8                fraction by     LV e' lateral:   7.77 cm/s                PLAX is 61      LV E/e' lateral: 5.0                %. LVIDd:         4.00 cm LVIDs:  2.70 cm LV PW:         1.10 cm LV IVS:        1.00 cm LVOT diam:     2.10 cm LVOT Area:     3.46 cm  RIGHT VENTRICLE RV Basal diam:  3.30 cm RV Mid diam:    3.00 cm RV S prime:     17.60 cm/s TAPSE (M-mode): 1.5 cm LEFT ATRIUM           Index        RIGHT ATRIUM           Index LA diam:      2.70 cm 1.43 cm/m   RA Area:     13.80 cm LA Vol (A4C): 37.3 ml 19.81 ml/m  RA Volume:   33.60 ml  17.85 ml/m  MITRAL VALVE MV Area (PHT): 2.83 cm    SHUNTS MV Decel Time: 268 msec    Systemic Diam: 2.10 cm MV E velocity: 38.80 cm/s MV A velocity: 63.40 cm/s MV E/A ratio:  0.61 Vinie Maxcy MD Electronically signed by Vinie Maxcy MD Signature Date/Time: 11/02/2023/2:12:15 PM    Final (Updated)    CT CHEST WO CONTRAST Result Date: 11/02/2023 CLINICAL DATA:  CABG workup short of breath EXAM: CT CHEST WITHOUT CONTRAST TECHNIQUE: Multidetector CT imaging of the chest was performed following the standard protocol without IV contrast. RADIATION DOSE REDUCTION: This exam was performed according to the departmental dose-optimization program which includes automated exposure control, adjustment of the mA and/or kV according to patient size and/or use of iterative reconstruction technique. COMPARISON:  Chest x-ray 11/01/2023, chest CT 07/17/2019 FINDINGS: Cardiovascular: Limited assessment without intravenous contrast. Advanced aortic atherosclerosis. Mild aneurysmal dilatation of the ascending aorta, measuring 4.1 cm maximum.  Multi-vessel advanced coronary vascular calcification. Normal cardiac size. No pericardial effusion Mediastinum/Nodes: Patent trachea. No thyroid  mass. No suspicious lymph nodes. Esophagus within normal limits. Lungs/Pleura: Emphysema. Scarring within the left upper and lower lobes. Band like density and thickening along the right posterior pleural surface with a few scattered calcifications suggestive of chronic scarring. Mild right lower lobe bronchiectasis. These findings are new when compared to lung bases from CT of September 2024. A few scattered punctate pulmonary nodules, largest seen in the left base and measures 3 mm on series 3, image 119. Upper Abdomen: No acute finding. Multiple renal cysts incompletely visualize, no specific imaging follow-up is recommended. Numerous pancreatic calcifications with pancreatic atrophy consistent with chronic pancreatitis. Musculoskeletal: No acute osseous abnormality. IMPRESSION: 1. Emphysema. Scarring within the left upper and lower lobes. Band like density and thickening along the right posterior pleural surface with a few scattered calcifications suggestive of chronic scarring but new compared with 2024. Mild right lower lobe bronchiectasis. 2. Few small punctate pulmonary nodules measuring up to 3 mm No follow-up needed if patient is low-risk (and has no known or suspected primary neoplasm). Non-contrast chest CT can be considered in 12 months if patient is high-risk. This recommendation follows the consensus statement: Guidelines for Management of Incidental Pulmonary Nodules Detected on CT Images: From the Fleischner Society 2017; Radiology 2017; 284:228-243. 3. Aortic atherosclerosis. Mild aneurysmal dilatation of ascending aorta up to 4.1 cm. Recommend annual imaging followup by CTA or MRA. This recommendation follows 2010 ACCF/AHA/AATS/ACR/ASA/SCA/SCAI/SIR/STS/SVM Guidelines for the Diagnosis and Management of Patients with Thoracic Aortic Disease. Circulation.  2010; 121: Z733-z630. Aortic aneurysm NOS (ICD10-I71.9) 4. Findings consistent with chronic pancreatitis Aortic Atherosclerosis (ICD10-I70.0) and Emphysema (ICD10-J43.9). Electronically Signed   By: Luke Bun  M.D.   On: 11/02/2023 21:21   VAS US  LOWER EXTREMITY VENOUS (DVT) Result Date: 11/02/2023  Lower Venous DVT Study Patient Name:  LATOYA DISKIN  Date of Exam:   11/02/2023 Medical Rec #: [id number]         Accession #:    [id number] Date of Birth: Apr 14, 1945        Patient Gender: M Patient Age:   51 years Exam Location:  [facility] Procedure:      VAS US  LOWER EXTREMITY VENOUS (DVT) Referring Phys: EKTA PATEL --------------------------------------------------------------------------------  Indications: SOB, and Edema (Leg size one is larger than other).  Comparison Study: No previous exams Performing Technologist: Jody Hill RVT, RDMS  Examination Guidelines: A complete evaluation includes B-mode imaging, spectral Doppler, color Doppler, and power Doppler as needed of all accessible portions of each vessel. Bilateral testing is considered an integral part of a complete examination. Limited examinations for reoccurring indications may be performed as noted. The reflux portion of the exam is performed with the patient in reverse Trendelenburg.  +---------+---------------+---------+-----------+----------+--------------+ RIGHT    CompressibilityPhasicitySpontaneityPropertiesThrombus Aging +---------+---------------+---------+-----------+----------+--------------+ CFV      Full           Yes      Yes                                 +---------+---------------+---------+-----------+----------+--------------+ SFJ      Full                                                        +---------+---------------+---------+-----------+----------+--------------+ FV Prox  Full           Yes      Yes                                  +---------+---------------+---------+-----------+----------+--------------+ FV Mid   Full           Yes      Yes                                 +---------+---------------+---------+-----------+----------+--------------+ FV DistalFull           Yes      Yes                                 +---------+---------------+---------+-----------+----------+--------------+ PFV      Full                                                        +---------+---------------+---------+-----------+----------+--------------+ POP      Full           Yes      Yes                                 +---------+---------------+---------+-----------+----------+--------------+ PTV      Full                                                        +---------+---------------+---------+-----------+----------+--------------+  PERO     Full                                                        +---------+---------------+---------+-----------+----------+--------------+   +---------+---------------+---------+-----------+----------+--------------+ LEFT     CompressibilityPhasicitySpontaneityPropertiesThrombus Aging +---------+---------------+---------+-----------+----------+--------------+ CFV      Full           Yes      Yes                                 +---------+---------------+---------+-----------+----------+--------------+ SFJ      Full                                                        +---------+---------------+---------+-----------+----------+--------------+ FV Prox  Full           Yes      Yes                                 +---------+---------------+---------+-----------+----------+--------------+ FV Mid   Full           Yes      Yes                                 +---------+---------------+---------+-----------+----------+--------------+ FV DistalFull           Yes      Yes                                  +---------+---------------+---------+-----------+----------+--------------+ PFV      Full                                                        +---------+---------------+---------+-----------+----------+--------------+ POP      Full           Yes      Yes                                 +---------+---------------+---------+-----------+----------+--------------+ PTV      Full                                                        +---------+---------------+---------+-----------+----------+--------------+ PERO     Full                                                        +---------+---------------+---------+-----------+----------+--------------+  Summary: BILATERAL: - No evidence of deep vein thrombosis seen in the lower extremities, bilaterally. -No evidence of popliteal cyst, bilaterally.   *See table(s) above for measurements and observations. Electronically signed by Debby Robertson on 11/02/2023 at 5:37:58 PM.    Final    DG Chest 2 View Result Date: 11/01/2023 CLINICAL DATA:  Dyspnea on exertion. EXAM: CHEST - 2 VIEW COMPARISON:  04/24/2023 and 04/20/2023 as well as 11/05/2022 FINDINGS: Lungs are adequately inflated without acute airspace consolidation or effusion. No pneumothorax. Possible small nodular opacity over the right mid to lower lung. Cardiomediastinal silhouette and remainder of the exam is unchanged. IMPRESSION: 1. No acute cardiopulmonary disease. 2. Possible small nodular opacity over the right mid to lower lung. Recommend follow-up chest radiograph 3 months. If persistent, recommend noncontrast chest CT for further evaluation. Electronically Signed   By: Toribio Agreste M.D.   On: 11/01/2023 10:50   DG Thoracic Spine W/Swimmers Result Date: 10/03/2023 CLINICAL DATA:  Back pain without known injury. EXAM: THORACIC SPINE - 3 VIEWS COMPARISON:  None Available. FINDINGS: There is no evidence of thoracic spine fracture. Alignment is normal. No other significant bone  abnormalities are identified. IMPRESSION: Negative. Electronically Signed   By: Lynwood Landy Raddle M.D.   On: 10/03/2023 08:27   DG Cervical Spine Complete Result Date: 10/03/2023 CLINICAL DATA:  Cervical pain without known injury. EXAM: CERVICAL SPINE - COMPLETE 4+ VIEW COMPARISON:  February 17, 2023. FINDINGS: Status post surgical posterior fusion of C2-3. Minimal grade 1 anterolisthesis of C4-5 is noted secondary to posterior facet joint hypertrophy. Moderate degenerative disc disease noted at C5-6 with anterior osteophyte formation. No fracture or prevertebral soft tissue swelling is noted. Mild bilateral neural foraminal stenosis is noted at C4-5 and C5-6 secondary to uncovertebral spurring. IMPRESSION: Postsurgical and degenerative changes as described above. No acute abnormality seen. Electronically Signed   By: Lynwood Landy Raddle M.D.   On: 10/03/2023 08:26         ASSESSMENT & PLAN   Assessment & Plan Insomnia, unspecified type Reported post-hospitalization. Previous use of amitriptyline  for sleep was paused due to hypotension concerns. Recent improvement in sleep with trazodone. Amitriptyline  is being restarted for sleep, with close monitoring of blood pressure. Restart amitriptyline  for sleep. Monitor blood pressure closely and consider alternative sleep aids if necessary. Memory loss History of CVA (cerebrovascular accident) Continue(s) with aspirin 81  Frailty Frequent falls Weakness Frailty and muscle weakness   Recent hospitalization and weight loss have exacerbated frailty and muscle weakness. Rehabilitation and increased protein intake are recommended to improve muscle strength. Outpatient rehab is pursued due to logistical challenges. Initiate physical therapy, ideally in a facility to prevent falls. Encourage protein intake to support muscle maintenance and consider inpatient rehab if outpatient appointments are challenging.  High risk of falls   Frailty, muscle weakness, and  anticoagulant use increase his fall risk. He currently uses a walker at home. Emphasize fall prevention due to increased risk from anticoagulants. Encourage use of the walker and consider inpatient rehab to prevent falls and improve strength. Type 2 diabetes mellitus with hyperglycemia, without long-term current use of insulin  (HCC) Type 2 diabetes mellitus   Elevated blood glucose levels are noted. Previous treatment with Ozempic  led to significant appetite suppression and weight loss. Transitioning to Trulicity to manage blood glucose with less impact on appetite and weight. Glipizide is added to increase insulin  production and lower blood glucose levels. Start Trulicity and glipizide. Monitor blood glucose levels regularly. Encourage dietary changes to  increase protein intake and reduce sugar intake. Chronic bilateral low back pain with bilateral sciatica  Neck pain  High risk medication use The condition was exacerbated by the recent aortic valve replacement, with hypotension likely due to heart strain post-surgery. Improvement in heart function is anticipated as recovery progresses. Monitor heart function and blood pressure. Encourage protein intake to support muscle maintenance and avoid strenuous activity to allow heart recovery. History of aortic valve replacement with porcine valve He underwent aortic valve replacement on November 15, 2023, with post-operative recovery marked by shortness of breath and hypotension. Heart strain was noted post-surgery, identified as muscle strain rather than myocardial infarction. Recovery is expected within three months, with full recovery in six months. Monitor blood pressure regularly. Stop amlodipine  and Lasix . Continue to hold Jardiance . Encourage adequate protein intake to maintain muscle mass and avoid strenuous activity until full recovery. Chronic kidney disease (CKD), active medical management without dialysis, stage 3 (moderate) (HCC)  Lab Results   Component Value Date   EGFR 27 (L) 10/10/2023   EGFR 30 (L) 08/30/2023   EGFR 32 (L) 08/29/2023   EGFR 30 (L) 08/14/2023   EGFR 37 (L) 07/20/2023   EGFR 44 (L) 06/07/2023   EGFR 39 (L) 02/22/2023   EGFR 40 (L) 01/17/2023   EGFR 38 (L) 12/28/2022  Recent flare Medication management Check TSH and free T4 due to amiodarone started with valve replacement. Irregular heart beat Due to hearing skip every 3rd beat- did EKG which was normal; I listned to heart again and it had normalized.  Encouraged home EKG monitoring.  EKG: sinus rhythm with rate 81, normal axis, normal  intervals, left atrial hypertrophy, nonspecific st  t wave changes  Aphasia Aphasia and mild cognitive impairment post-surgery   These issues are likely due to reduced cerebral perfusion. MRI showed no stroke. Gradual improvement in speech is anticipated over time. Continue cholesterol pills and anticoagulants to maintain cerebral perfusion. Monitor cognitive function and speech. CONSTIPATION, CHRONIC Potentially exacerbated by amlodipine  use. Discontinuation of amlodipine  is expected to alleviate symptoms. Stop amlodipine  to alleviate constipation.            Insomnia     Constipation    Recording duration: 26 minutes       ORDER ASSOCIATIONS  #   DIAGNOSIS / CONDITION ICD-10 ENCOUNTER ORDER     ICD-10-CM   1. Frailty  R54 Ambulatory referral to Physical Therapy    2. Insomnia, unspecified type  G47.00 Dulaglutide (TRULICITY) 0.75 MG/0.5ML SOAJ    3. Memory loss  R41.3 Dulaglutide (TRULICITY) 0.75 MG/0.5ML SOAJ    4. History of CVA (cerebrovascular accident)  Z86.73 Ambulatory referral to Physical Therapy    EKG 12-Lead    5. Frequent falls  R29.6 Ambulatory referral to Physical Therapy    6. Weakness  R53.1 Ambulatory referral to Physical Therapy    7. Type 2 diabetes mellitus with hyperglycemia, without long-term current use of insulin  (HCC)  E11.65 Dulaglutide (TRULICITY) 0.75 MG/0.5ML SOAJ     glipiZIDE (GLUCOTROL) 5 MG tablet    8. Chronic bilateral low back pain with bilateral sciatica  M54.42 Oxycodone  HCl 10 MG TABS   M54.41    G89.29     9. Neck pain  M54.2 Oxycodone  HCl 10 MG TABS    10. High risk medication use  Z79.899 Oxycodone  HCl 10 MG TABS           Orders Placed in Encounter:   Lab Orders  No laboratory test(s) ordered today  Imaging Orders  No imaging studies ordered today   Referral Orders         Ambulatory referral to Physical Therapy     Meds ordered this encounter  Medications   Dulaglutide (TRULICITY) 0.75 MG/0.5ML SOAJ    Sig: Inject 0.75 mg into the skin once a week. Replaces ozempic     Dispense:  2 mL    Refill:  2   glipiZIDE (GLUCOTROL) 5 MG tablet    Sig: Take 1 tablet (5 mg total) by mouth 2 (two) times daily before a meal.    Dispense:  180 tablet    Refill:  3   Oxycodone  HCl 10 MG TABS    Sig: Take 1 tablet (10 mg total) by mouth every 8 (eight) hours as needed.    Dispense:  90 tablet    Refill:  0    Orders Placed This Encounter  Procedures   Ambulatory referral to Physical Therapy    Referral Priority:   Routine    Referral Type:   Physical Medicine    Referral Reason:   Specialty Services Required    Requested Specialty:   Physical Therapy    Number of Visits Requested:   1   EKG 12-Lead   ED Discharge Orders          Ordered    Dulaglutide (TRULICITY) 0.75 MG/0.5ML SOAJ  Weekly        11/27/23 0905    Ambulatory referral to Physical Therapy       Comments: Post demand ischemia frailty falls associated with weakness from history bad valve   11/27/23 0905    glipiZIDE (GLUCOTROL) 5 MG tablet  2 times daily before meals        11/27/23 0905    Oxycodone  HCl 10 MG TABS  Every 8 hours PRN        11/27/23 0907    EKG 12-Lead        11/27/23 0915               This document was synthesized by artificial intelligence (Abridge) using HIPAA-compliant recording of the clinical interaction;   We discussed  the use of AI scribe software for clinical note transcription with the patient, who gave verbal consent to proceed. additional Info: This encounter employed state-of-the-art, real-time, collaborative documentation. The patient actively reviewed and assisted in updating their electronic medical record on a shared screen, ensuring transparency and facilitating joint problem-solving for the problem list, overview, and plan. This approach promotes accurate, informed care. The treatment plan was discussed and reviewed in detail, including medication safety, potential side effects, and all patient questions. We confirmed understanding and comfort with the plan. Follow-up instructions were established, including contacting the office for any concerns, returning if symptoms worsen, persist, or new symptoms develop, and precautions for potential emergency department visits.

## 2023-11-27 NOTE — Assessment & Plan Note (Signed)
 Check TSH and free T4 due to amiodarone started with valve replacement.

## 2023-11-27 NOTE — Patient Instructions (Addendum)
 It was a pleasure seeing you today! Your health and satisfaction are our top priorities.  Michael Cone, MD  VISIT SUMMARY: Today, we discussed your recovery following your aortic valve replacement surgery. We addressed your shortness of breath, heart function, muscle weakness, risk of falls, cognitive function, diabetes management, sleep issues, and constipation. We made several adjustments to your medications and provided recommendations to support your recovery.  YOUR PLAN: -STATUS POST AORTIC VALVE REPLACEMENT: You are recovering from your aortic valve replacement surgery. This procedure replaced a damaged valve in your heart. We expect your recovery to take about six months. Please monitor your blood pressure regularly, stop taking amlodipine  and Lasix , and continue to avoid strenuous activities. Ensure you are eating enough protein to help maintain your muscle mass.  -HEART FAILURE WITH REDUCED EJECTION FRACTION: This condition means your heart is not pumping blood as well as it should. It was worsened by your recent surgery. We expect your heart function to improve as you recover. Please monitor your heart function and blood pressure, eat enough protein, and avoid strenuous activities.  -FRAILTY AND MUSCLE WEAKNESS: You have experienced muscle weakness and frailty, especially after your recent hospitalization. We recommend increasing your protein intake and starting physical therapy to improve your muscle strength. Outpatient rehab is suggested, but if it becomes too challenging, consider inpatient rehab.  -HIGH RISK OF FALLS: Due to your muscle weakness and use of blood thinners, you are at a higher risk of falling. Please continue using your walker and consider inpatient rehab to help prevent falls and improve your strength.  -APHASIA AND MILD COGNITIVE IMPAIRMENT POST-SURGERY: You have had some difficulty with speech and mild cognitive issues since your surgery, likely due to reduced blood  flow to the brain. We expect gradual improvement over time. Continue taking your cholesterol and blood thinner medications, and we will monitor your cognitive function and speech.  -TYPE 2 DIABETES MELLITUS: Your blood sugar levels have been high. We are switching you from Ozempic  to Trulicity to manage your blood sugar without suppressing your appetite. We are also adding Glipizide to help lower your blood sugar. Please monitor your blood sugar levels regularly and make dietary changes to increase protein and reduce sugar intake.  -INSOMNIA: You have had trouble sleeping since your hospital stay. We are restarting amitriptyline  to help you sleep, but we will monitor your blood pressure closely. If necessary, we may consider other sleep aids.  -CONSTIPATION: You have been experiencing constipation, which may have been worsened by amlodipine . Stopping amlodipine  should help alleviate this symptom.  INSTRUCTIONS: Please monitor your blood pressure and blood sugar levels regularly. Follow the dietary recommendations to increase protein intake and reduce sugar intake. Continue using your walker to prevent falls. Consider starting physical th erapy and possibly inpatient rehab if outpatient rehab is too challenging. We will monitor your cognitive function and speech, and you should continue taking your cholesterol and blood thinner medications. If you have any concerns or experience any new symptoms, please contact our office.  Your Providers PCP: [name], MD,  [phone]) Referring Provider: [name], MD,  [phone]) Care Team Provider: Evonnie Asberry RAMAN, DO,  [phone]) Care Team Provider: Fleeta Rothman, Jomarie SAILOR, MD,  [phone]) Care Team Provider: Avram Lupita BRAVO, MD,  [phone]) Care Team Provider: Okey Vina GAILS, MD,  [phone]) Care Team Provider: [name], NP,  [phone])  NEXT STEPS: [x]  Early Intervention: Schedule sooner appointment, call  our on-call services, or go to emergency room  if there is any significant Increase in pain or discomfort New or worsening symptoms Sudden or severe changes in your health [x]  Flexible Follow-Up: We recommend a Return in about 2 weeks (around 12/11/2023). for optimal routine care. This allows for progress monitoring and treatment adjustments. [x]  Preventive Care: Schedule your annual preventive care visit! It's typically covered by insurance and helps identify potential health issues early. [x]  Lab & X-ray Appointments: Incomplete tests scheduled today, or call to schedule. X-rays: [facility] (M-F, 8:30am-noon or 1pm-5pm). [x]  Medical Information Release: Sign a release form at front desk to obtain relevant medical information we don't have.  MAKING THE MOST OF OUR FOCUSED 20 MINUTE APPOINTMENTS: [x]   Clearly state your top concerns at the beginning of the visit to focus our discussion [x]   If you anticipate you will need more time, please inform the front desk during scheduling - we can book multiple appointments in the same week. [x]   If you have transportation problems- use our convenient video appointments or ask about transportation support. [x]   We can get down to business faster if you use MyChart to update information before the visit and submit non-urgent questions before your visit. Thank you for taking the time to provide details through MyChart.  Let our nurse know and she can import this information into your encounter documents.  Arrival and Wait Times: [x]   Arriving on time ensures that everyone receives prompt attention. [x]   Early morning (8a) and afternoon (1p) appointments tend to have shortest wait times. [x]   Unfortunately, we cannot delay appointments for late arrivals or hold slots during phone calls.  Getting Answers and Following Up [x]   Simple Questions & Concerns: For quick questions or basic follow-up after your visit, reach us  at [phone] or  MyChart messaging. [x]   Complex Concerns: If your concern is more complex, scheduling an appointment might be best. Discuss this with the staff to find the most suitable option. [x]   Lab & Imaging Results: We'll contact you directly if results are abnormal or you don't use MyChart. Most normal results will be on MyChart within 2-3 business days, with a review message from Dr. Jesus. Haven't heard back in 2 weeks? Need results sooner? Contact us  at [phone]. [x]   Referrals: Our referral coordinator will manage specialist referrals. The specialist's office should contact you within 2 weeks to schedule an appointment. Call us  if you haven't heard from them after 2 weeks.  Staying Connected [x]   MyChart: Activate your MyChart for the fastest way to access results and message us . See the last page of this paperwork for instructions on how to activate.  Bring to Your Next Appointment [x]   Medications: Please bring all your medication bottles to your next appointment to ensure we have an accurate record of your prescriptions. [x]   Health Diaries: If you're monitoring any health conditions at home, keeping a diary of your readings can be very helpful for discussions at your next appointment.  Billing [x]   X-ray & Lab Orders: These are billed by separate companies. Contact the invoicing company directly for questions or concerns. [x]   Visit Charges: Discuss any billing inquiries with our administrative services team.  Your Satisfaction Matters [x]   Share Your Experience: We strive for your satisfaction! If you have any complaints, or preferably compliments, please let Dr. Jesus know directly or contact our Practice Administrators, Manuelita Rubin or [employer], by asking at the front desk.   Reviewing Your Records [x]   Review this early  draft of your clinical encounter notes below and the final encounter summary tomorrow on MyChart after its been completed.  All orders placed so far are  visible here: Frailty Assessment & Plan: Frailty and muscle weakness   Recent hospitalization and weight loss have exacerbated frailty and muscle weakness. Rehabilitation and increased protein intake are recommended to improve muscle strength. Outpatient rehab is pursued due to logistical challenges. Initiate physical therapy, ideally in a facility to prevent falls. Encourage protein intake to support muscle maintenance and consider inpatient rehab if outpatient appointments are challenging.  High risk of falls   Frailty, muscle weakness, and anticoagulant use increase his fall risk. He currently uses a walker at home. Emphasize fall prevention due to increased risk from anticoagulants. Encourage use of the walker and consider inpatient rehab to prevent falls and improve strength.  Orders: -     Ambulatory referral to Physical Therapy  Insomnia, unspecified type Assessment & Plan: Reported post-hospitalization. Previous use of amitriptyline  for sleep was paused due to hypotension concerns. Recent improvement in sleep with trazodone. Amitriptyline  is being restarted for sleep, with close monitoring of blood pressure. Restart amitriptyline  for sleep. Monitor blood pressure closely and consider alternative sleep aids if necessary.  Orders: -     Trulicity; Inject 0.75 mg into the skin once a week. Replaces ozempic   Dispense: 2 mL; Refill: 2  Memory loss Assessment & Plan: Continue(s) with aspirin 81   Orders: -     Trulicity; Inject 0.75 mg into the skin once a week. Replaces ozempic   Dispense: 2 mL; Refill: 2  History of CVA (cerebrovascular accident) Assessment & Plan: Continue(s) with aspirin 81   Orders: -     Ambulatory referral to Physical Therapy -     EKG 12-Lead  Frequent falls Assessment & Plan: Frailty and muscle weakness   Recent hospitalization and weight loss have exacerbated frailty and muscle weakness. Rehabilitation and increased protein intake are recommended to  improve muscle strength. Outpatient rehab is pursued due to logistical challenges. Initiate physical therapy, ideally in a facility to prevent falls. Encourage protein intake to support muscle maintenance and consider inpatient rehab if outpatient appointments are challenging.  High risk of falls   Frailty, muscle weakness, and anticoagulant use increase his fall risk. He currently uses a walker at home. Emphasize fall prevention due to increased risk from anticoagulants. Encourage use of the walker and consider inpatient rehab to prevent falls and improve strength.  Orders: -     Ambulatory referral to Physical Therapy  Weakness Assessment & Plan: Frailty and muscle weakness   Recent hospitalization and weight loss have exacerbated frailty and muscle weakness. Rehabilitation and increased protein intake are recommended to improve muscle strength. Outpatient rehab is pursued due to logistical challenges. Initiate physical therapy, ideally in a facility to prevent falls. Encourage protein intake to support muscle maintenance and consider inpatient rehab if outpatient appointments are challenging.  High risk of falls   Frailty, muscle weakness, and anticoagulant use increase his fall risk. He currently uses a walker at home. Emphasize fall prevention due to increased risk from anticoagulants. Encourage use of the walker and consider inpatient rehab to prevent falls and improve strength.  Orders: -     Ambulatory referral to Physical Therapy  Type 2 diabetes mellitus with hyperglycemia, without long-term current use of insulin  (HCC) Assessment & Plan: Type 2 diabetes mellitus   Elevated blood glucose levels are noted. Previous treatment with Ozempic  led to significant appetite suppression and weight loss.  Transitioning to Trulicity to manage blood glucose with less impact on appetite and weight. Glipizide is added to increase insulin  production and lower blood glucose levels. Start Trulicity and  glipizide. Monitor blood glucose levels regularly. Encourage dietary changes to increase protein intake and reduce sugar intake.  Orders: -     Trulicity; Inject 0.75 mg into the skin once a week. Replaces ozempic   Dispense: 2 mL; Refill: 2 -     glipiZIDE; Take 1 tablet (5 mg total) by mouth 2 (two) times daily before a meal.  Dispense: 180 tablet; Refill: 3  Chronic bilateral low back pain with bilateral sciatica -     oxyCODONE  HCl; Take 1 tablet (10 mg total) by mouth every 8 (eight) hours as needed.  Dispense: 90 tablet; Refill: 0  Neck pain -     oxyCODONE  HCl; Take 1 tablet (10 mg total) by mouth every 8 (eight) hours as needed.  Dispense: 90 tablet; Refill: 0  High risk medication use Assessment & Plan: The condition was exacerbated by the recent aortic valve replacement, with hypotension likely due to heart strain post-surgery. Improvement in heart function is anticipated as recovery progresses. Monitor heart function and blood pressure. Encourage protein intake to support muscle maintenance and avoid strenuous activity to allow heart recovery.  Orders: -     oxyCODONE  HCl; Take 1 tablet (10 mg total) by mouth every 8 (eight) hours as needed.  Dispense: 90 tablet; Refill: 0  History of aortic valve replacement with porcine valve Assessment & Plan: He underwent aortic valve replacement on November 15, 2023, with post-operative recovery marked by shortness of breath and hypotension. Heart strain was noted post-surgery, identified as muscle strain rather than myocardial infarction. Recovery is expected within three months, with full recovery in six months. Monitor blood pressure regularly. Stop amlodipine  and Lasix . Continue to hold Jardiance . Encourage adequate protein intake to maintain muscle mass and avoid strenuous activity until full recovery.   Chronic kidney disease (CKD), active medical management without dialysis, stage 3 (moderate) (HCC) -     Comprehensive metabolic panel  with GFR -     CBC with Differential/Platelet -     Microalbumin / creatinine urine ratio  Medication management Assessment & Plan: Check TSH and free T4 due to amiodarone started with valve replacement.  Orders: -     TSH + free T4  Irregular heart beat -     TSH + free T4  Aphasia  CONSTIPATION, CHRONIC

## 2023-11-27 NOTE — Assessment & Plan Note (Signed)
 Potentially exacerbated by amlodipine  use. Discontinuation of amlodipine  is expected to alleviate symptoms. Stop amlodipine  to alleviate constipation.

## 2023-11-28 NOTE — Progress Notes (Signed)
 Spoke with jamie pt son he will bring him back for labs when he comes in for PT

## 2023-11-30 ENCOUNTER — Encounter: Payer: Self-pay | Admitting: *Deleted

## 2023-11-30 ENCOUNTER — Ambulatory Visit

## 2023-12-04 ENCOUNTER — Ambulatory Visit: Attending: Emergency Medicine | Admitting: Emergency Medicine

## 2023-12-04 ENCOUNTER — Encounter: Admitting: Physical Medicine and Rehabilitation

## 2023-12-04 ENCOUNTER — Encounter: Payer: Self-pay | Admitting: Emergency Medicine

## 2023-12-04 VITALS — BP 112/56 | HR 65 | Ht 71.0 in | Wt 137.0 lb

## 2023-12-04 DIAGNOSIS — Z79899 Other long term (current) drug therapy: Secondary | ICD-10-CM

## 2023-12-04 DIAGNOSIS — I10 Essential (primary) hypertension: Secondary | ICD-10-CM

## 2023-12-04 DIAGNOSIS — I2510 Atherosclerotic heart disease of native coronary artery without angina pectoris: Secondary | ICD-10-CM

## 2023-12-04 DIAGNOSIS — N1832 Chronic kidney disease, stage 3b: Secondary | ICD-10-CM | POA: Diagnosis not present

## 2023-12-04 DIAGNOSIS — I5020 Unspecified systolic (congestive) heart failure: Secondary | ICD-10-CM

## 2023-12-04 DIAGNOSIS — Z952 Presence of prosthetic heart valve: Secondary | ICD-10-CM

## 2023-12-04 DIAGNOSIS — I480 Paroxysmal atrial fibrillation: Secondary | ICD-10-CM | POA: Diagnosis not present

## 2023-12-04 DIAGNOSIS — I351 Nonrheumatic aortic (valve) insufficiency: Secondary | ICD-10-CM | POA: Diagnosis not present

## 2023-12-04 DIAGNOSIS — E785 Hyperlipidemia, unspecified: Secondary | ICD-10-CM

## 2023-12-04 MED ORDER — FUROSEMIDE 40 MG PO TABS: 40.0000 mg | ORAL_TABLET | Freq: Every day | ORAL | 1 refills | Status: DC

## 2023-12-04 NOTE — Patient Instructions (Signed)
 Medication Instructions:  RESTART LASIX  40 MG DAILY.  Lab Work: PRO BNP, BMET, AND CBC TO BE DONE TODAY.   Testing/Procedures: NONE  Follow-Up: At [facility], you and your health needs are our priority.  As part of our continuing mission to provide you with exceptional heart care, our providers are all part of one team.  This team includes your primary Cardiologist (physician) and Advanced Practice Providers or APPs (Physician Assistants and Nurse Practitioners) who all work together to provide you with the care you need, when you need it.  Your next appointment:   1 MONTH  Provider:   MADISON FOUNTAIN, NP

## 2023-12-04 NOTE — Progress Notes (Signed)
 Cardiology Office Note:    Date:  12/04/2023  ID:  YACOUB DILTZ, DOB 1945-12-26, MRN [id number] PCP: [name], MD  [facility] Providers Cardiologist:  Vina Gull, MD Cardiology APP:  [name], NP       Patient Profile:       Chief Complaint: Follow-up s/p AVR History of Present Illness:  Michael Bender is a 78 y.o. male with visit-pertinent history of severe aortic regurgitation s/p AVR, hypertension, hyperlipidemia, HFmrEF, orthostatic hypotension with recurrent syncope, paroxysmal atrial fibrillation, postoperative atrial fibrillation, T2DM, rhabdomyolysis, BPH, PAD, emphysema, GERD, DJD   He was admitted 10/27/2022 through 11/02/2022 for AKI, bladder outlet obstruction, and traumatic rhabdomyolysis.  CK on admission was 2828 and was started on IV fluids with improvement.  His creatinine was 4.2 on admission and down to 1.61 at discharge.   He was admitted 11/05/2022 through 11/14/2022 with recurrent syncopal episode associated with generalized weakness and encephalopathy and found to have MSSA bacteremia on blood cultures on 11/05/2022 and septic arthritis.  He was started empirically on antibiotics.  ID was consulted and MRI of the left wrist was done concerning for septic arthritis.  Orthopedic surgery was consulted and he underwent left wrist capsulotomy and washout on 11/09/2022.  MRI of C-spine was done that showed discitis of C5-C6.  PICC line was inserted on 11/13/2022 and he was to continue antibiotics for 6 weeks.  Syncope felt to be due to orthostatic hypotension.  He was also found to be in new onset atrial fibrillation with RVR.  He was started on IV diltiazem  and spontaneously converted and switched to oral metoprolol  at discharge.  It was noted he was not a candidate for anticoagulation due to recurrent falls.  He was also newly diagnosed with T2DM with A1c of 6.5 and discharged on oral metformin .   He was admitted on 04/20/2023 through 04/24/2023 with  acute on chronic heart failure.  He presented with worsening exertional shortness of breath.  BNP was 2352.  Checks x-ray showed pulmonary congestion.  He was started on IV Lasix .  Echocardiogram showed newly reduced LVEF 40 to 45%, grade 1 diastolic dysfunction, normal RV. Had AKI so cardiac cath was deferred and diuretics were held at discharge.  Entresto  and spironolactone  were not started due to his renal function and beta-blocker use was not started due to severe COPD.   He followed up with advanced heart failure clinic on 05/01/2023.  It is unclear the etiology for the reduction in his EF.  Could be related to recent MSSA sepsis versus uncontrolled hypertension.  He was NYHA class II and volume status okay during clinic visit.  He was started on losartan  25 mg daily.  He was continued on hydralazine  50 mg 3 times daily and Imdur  30 mg daily.  SGLT2i was avoided due to concern for hygiene and volume depletion.  Cardiac MRI was ordered to evaluate for infiltrative disease and is currently pending.   He was seen in clinic on 06/07/2023.  He is doing well at the time.  His blood pressure was elevated in office at 164/74.  His losartan  was discontinued and he was started on Entresto  49-51 mg twice daily and spironolactone  12.5 mg daily.  Follow-up lab work on 05/19/2023 showed worsening creatinine of 1.6.  Spironolactone  was discontinued at that time.   Cardiac MRI 06/06/2023 showed LVEF 42%, moderate to severe aortic regurgitation consistent with aortic regurgitation associated with left ventricular dysfunction, evidence of mild ascending dilation of aorta  measuring 43 mm.   Patient was seen on 07/20/2023.  Patient was overall asymptomatic with chronic stable dyspnea.  He was started on bisoprolol  5 mg daily.  TEE was scheduled for further evaluation of his severe aortic regurgitation.  TEE was completed on 07/24/2023 showing oscillating linear density (0.7-0.8 cm) on left coronary cusp of aortic valve concerning  for vegetation and severe aortic regurgitation, LVEF 50 to 55%, no RWMA, mild mitral valve regurgitation, moderate grade 3 plaque involving the ascending aorta.  Blood cultures were drawn and he was referred to infectious disease.  Seen by ID on 08/03/2023 for suspected the AV vegetation (with negative blood cultures and no sign of sepsis).  Per ID it was suspected that the AV vegetation is residual from prior infection on 11/2018 for MSSA BSI.  Plan was discussed with cardiology and was agreed not to start IV antibiotics given likelihood of residual infection.   Labs drawn with PCP on 7/14 showing creatinine of 2.18, GFR 38, and potassium 5.5.  His Jardiance  was subsequently held at that time.   Patient was last seen in clinic on 08/29/2023.  Patient was doing well at the time without any acute complaints.  Remains relatively asymptomatic.  Repeat BMET was placed showing continued elevated potassium of 5.4 and creatinine of 2.21.  He was given a coupon for Lokelma  and Entresto  was reduced to 24-26 mg twice daily.  He was referred to nephrology.   He was seen by cardiothoracic surgery on 08/30/2023.  Per cardiothoracic surgery aortic valve replacement was suggested to prevent progressive left ventricular dysfunction and worsening heart failure symptoms related to severe aortic insufficiency.  This would have required open surgery because he is not a candidate for TAVR with a possible vegetation and pure aortic insufficiency.  Patient reported he would like to hold off on surgery for now we will continue medical therapy and observation.  He presented to the ED on 10/1 with complaints of exertional dyspnea.  Reported he could barely walk 5 feet without getting short of breath.  He had an AKI on CKD with creatinine upon arrival at 3, BNP 634 and troponin 67, 62 thought to be due to demand.  Echocardiogram 11/02/2023 read LVEF 60 to 65%.  He underwent cardiac catheterization on 11/06/2023 showing patent coronary  arteries with mild diffuse calcific plaquing but no significant stenosis.  He then underwent aortic valve replacement with a 21 mm Edwards Inspiris bioprosthetic valve and left atrial appendage ligation on 11/08/2023.  After procedure patient was noted to have decreased level consciousness, disorientation, right facial droop along with bilateral arm and lower extremity weakness.  He had right side neglect on exam.  Code stroke was initiated.  CT scan of the head and neck were negative with any acute findings.  He was seen by neurology and loaded with Keppra for suspected seizure activity.  An EEG was showing no seizures.  His mental status improved significantly.  MRI of the brain on 11/10/2023 showed moderately severe chronic small vessel ischemic disease but no acute abnormalities.  His creatinine level improved and he was started on Lasix  to help facilitate diuresis.  He developed atrial fibrillation with RVR and started on IV amiodarone.  He became hypertensive so Lopressor  was increased to 50 mg twice daily and amlodipine  was increased to 10 mg daily.  He went to inpatient rehab for 6 days and then was discharged home.  He followed up with cardiothoracic surgery on 11/23/2023.  He was doing well without acute  complaints.  He had been instructed to stop amitriptyline .  Followed up with PCP on 11/27/2023.  He was noted to be hypotensive.  His amlodipine  and furosemide  were discontinued.  He was to continue to hold Jardiance .   Discussed the use of AI scribe software for clinical note transcription with the patient, who gave verbal consent to proceed.  History of Present Illness QUE MENEELY is a 78 year old male who presents for follow-up post aortic valve replacement.  Today he presents with worsening shortness of breath over the past week.  Tells me post AVR he has had minimal improvement in his shortness of breath however over the past week he has noted slight worsening of his exertional  dyspnea.  He reports he only gets short of breath on exertion.  He denies any shortness of breath at rest.  Furthermore he denies any orthopnea, PND, LEE, or weight gain.  Reports he is now sleeping better.  He denies any chest pains, syncope, presyncope, lightheadedness or dizziness.    Review of systems:  Please see the history of present illness. All other systems are reviewed and otherwise negative.      Studies Reviewed:    EKG Interpretation Date/Time:  Monday December 04 2023 11:39:27 EST Ventricular Rate:  70 PR Interval:  164 QRS Duration:  76 QT Interval:  394 QTC Calculation: 425 R Axis:   84  Text Interpretation: Normal sinus rhythm Possible Left atrial enlargement ST & T wave abnormality, consider inferolateral ischemia When compared with ECG of 19-Nov-2023 14:49, ST no longer elevated in Inferior leads T wave inversion now evident in Inferior leads Inverted T waves have replaced nonspecific T wave abnormality in Lateral leads Confirmed by Rana Dixon [phone]) on 12/04/2023 12:17:32 PM   Cardiac catheterization 11/06/2023 1.  Patent coronary arteries with mild diffuse calcific plaquing but no significant stenoses 2.  Known severe aortic insufficiency with wide pulse pressure noted 3.  Right heart data: RA mean 3 mmHg RV 30/5 mmHg PA 30/14 mean 20 mmHg Pulmonary wedge pressure 13 mmHg (A-wave 12, V wave 15) LVEDP 9 mmHg Cardiac output/index by Fick measurement 6.1/3.4 Diagnostic Dominance: Right  Echocardiogram 11/02/2023 1. Technically difficult study, images off axis   2. Left ventricular ejection fraction, by estimation, is 60 to 65%. Left  ventricular ejection fraction by PLAX is 61 %. The left ventricle has  normal function. The left ventricle has no regional wall motion  abnormalities. Left ventricular diastolic  parameters are consistent with Grade I diastolic dysfunction (impaired  relaxation).   3. Right ventricular systolic function is normal. The  right ventricular  size is normal.   4. The mitral valve is normal in structure. No evidence of mitral valve  regurgitation.   5. Calcified aortic root. There appears to be eccentric, posteriorly  directed regurgitation (worst on image 77). The aortic valve was not well  visualized. Aortic valve regurgitation is moderate. Aortic valve  sclerosis/calcification is present, without  any evidence of aortic stenosis.   TEE 07/24/2023  1. Oscillating, linear density (0.7-0.8 cm) on left coronary cusp of  aortic valve concerning for vegetation; severe AI (best seen on  transgastric views).   2. Left ventricular ejection fraction, by estimation, is 50 to 55%. The  left ventricle has low normal function. The left ventricle has no regional  wall motion abnormalities. The left ventricular internal cavity size was  mildly dilated.   3. Right ventricular systolic function is mildly reduced. The right  ventricular size  is normal.   4. Left atrial size was moderately dilated. No left atrial/left atrial  appendage thrombus was detected.   5. The mitral valve is normal in structure. Mild mitral valve  regurgitation.   6. The aortic valve is tricuspid. Aortic valve regurgitation is severe.   7. There is Moderate (Grade III) plaque involving the descending aorta.   8. 3D performed of the aortic valve.    Cardiac MRI 06/06/2023 IMPRESSION: 1. Mild decrease left ventricular systolic function (LVEF =42%).   2. Moderate to severe regurgitation with regurgitant fraction 35% with moderate left ventricular dilation. This is markedly different that color Doppler evaluation on recent echocardiogram. Consider TEE assessment as this MRI is consistent with aortic regurgitation associated left ventricular dysfunction.   3. Evidence of mild ascending aortic dilation, 43 mm.   Echocardiogram 04/21/2023  1. Left ventricular ejection fraction, by estimation, is 40 to 45%. The  left ventricle has mildly decreased  function. The left ventricle  demonstrates global hypokinesis. There is mild left ventricular  hypertrophy. Left ventricular diastolic parameters  are consistent with Grade I diastolic dysfunction (impaired relaxation).   2. Right ventricular systolic function is normal. The right ventricular  size is normal. There is mildly elevated pulmonary artery systolic  pressure. The estimated right ventricular systolic pressure is 38.0 mmHg.   3. Moderate pleural effusion in both left and right lateral regions.   4. The mitral valve is abnormal. Trivial mitral valve regurgitation.   5. The aortic valve is tricuspid. Aortic valve regurgitation is trivial.  Aortic valve sclerosis/calcification is present, without any evidence of  aortic stenosis.   6. The inferior vena cava is normal in size with greater than 50%  respiratory variability, suggesting right atrial pressure of 3 mmHg.    Echocardiogram 11/07/2022 1. Left ventricular ejection fraction, by estimation, is 60 to 65%. The  left ventricle has normal function. The left ventricle has no regional  wall motion abnormalities. Left ventricular diastolic parameters are  consistent with Grade II diastolic  dysfunction (pseudonormalization).   2. Right ventricular systolic function is normal. The right ventricular  size is normal. There is normal pulmonary artery systolic pressure.   3. The mitral valve is normal in structure. No evidence of mitral valve  regurgitation. No evidence of mitral stenosis.   4. The aortic valve is tricuspid. Aortic valve regurgitation is not  visualized. No aortic stenosis is present.   5. The inferior vena cava is normal in size with greater than 50%  respiratory variability, suggesting right atrial pressure of 3 mmHg.  Risk Assessment/Calculations:    CHA2DS2-VASc Score = 8   This indicates a 10.8% annual risk of stroke. The patient's score is based upon: CHF History: 1 HTN History: 1 Diabetes History: 1 Stroke  History: 2 Vascular Disease History: 1 Age Score: 2 Gender Score: 0             Physical Exam:   VS:  BP (!) 112/56 (BP Location: Left Arm, Patient Position: Sitting, Cuff Size: Normal)   Pulse 65   Ht 5' 11 (1.803 m)   Wt 137 lb (62.1 kg)   SpO2 96%   BMI 19.11 kg/m    Wt Readings from Last 3 Encounters:  12/04/23 137 lb (62.1 kg)  11/27/23 140 lb 3.2 oz (63.6 kg)  11/23/23 145 lb (65.8 kg)    GEN: Well nourished, well developed in no acute distress NECK: No JVD; No carotid bruits CARDIAC: RRR, no murmurs, rubs, gallops RESPIRATORY:  Clear to auscultation without rales, wheezing or rhonchi  ABDOMEN: Soft, non-tender, non-distended EXTREMITIES:  No edema; No acute deformity      Assessment and Plan:  HFmrEF Echocardiogram 11/2022 with LVEF 60 to 65% Echocardiogram 04/2023 with LVEF 40 to 45% Cardiac MRI 06/2023 with LVEF 42% and RVEF 48% with severe aortic regurgitation associated with LV dysfunction TEE 07/2023 showed LVEF 50 to 55%, severe aortic valve regurgitation TEE postop AVR 11/2023 with LVEF 50% CHF in the setting of severe AI now s/p AVR on 11/08/2023 - NYHA class II (dyspnea) - GDMT limited due to CKD and hypotension.  Renal function prohibits use of Entresto  or spironolactone  at this point - Dyspnea on exertion is unchanged since AVR but may have worsened over the past week.  He denies orthopnea, PND, LEE, weight gain - Jardiance  on hold due to hypotension post AVR.  Will continue to hold at this time and consider reinstating at follow-up visit - Plan to start furosemide  40 mg daily - Continue metoprolol  tartrate 50 mg twice daily - Pending repeat echocardiogram on 11/25 - BMET, CBC, and proBNP today  Severe aortic regurgitation s/p AVR Cardiac MRI 06/2023 showed moderate to severe regurgitation with regurgitant fraction of 35% with moderate left ventricular dilation consistent with aortic regurgitation associated left ventricular dysfunction S/p aortic valve  replacement with a 21 mm Edwards Inspiris bioprosthetic valve on 10/8 - Today he reports slight worsening of DOE over the past week.  Aligning with the time his furosemide  was held by PCP on 10/27 due to hypotension.  No orthopnea, PND, LEE, weight gain - Plan to restart furosemide  40 mg daily - BMET, CBC, proBNP today - Postop echocardiogram scheduled for 11/25 - Follow-up visit scheduled with cardiothoracic surgery on 11/13 - SBE prophylaxis  CKD stage IIIb Creatinine 1.8 and GFR 38 on 11/2023 Avoid NSAIDs and stay adequately hydrated - Scheduled to establish with nephrology on 12/1 - BMET today  Hypertension Blood pressure today is 112/56 and well-controlled - Experienced hypotension post AVR.  Amlodipine , isosorbide , bisoprolol , and hydralazine  were subsequently discontinued - Continue metoprolol  tartrate 50 mg twice daily - Reinstating furosemide  20 mg daily today.  He will monitor his blood pressure closely at home  Paroxysmal atrial fibrillation Postoperative atrial fibrillation S/p left atrial appendage ligation on 11/08/2023 First episode noted during admission with sepsis/MSSA bacteremia on 11/2022 and spontaneously converted to sinus rhythm.  Was noted not to be a candidate for anticoagulation due to recurrent falls at the time as he was also noted to have history of GI bleed due to duodenal ulcer in 2021 Experienced an episode of atrial fibrillation with RVR postop AVR and left atrial appendage ligation on 10/8.  Now managed on amiodarone - He has been deemed not a candidate for anticoagulation in the past due to his frailty, muscle weakness, and high risk of falls - He denies any symptoms concerning for recurrent atrial fibrillation - EKG today shows he is maintaining sinus rhythm - Can consider discontinuing amiodarone after 3 months of therapy if he continues to maintain sinus rhythm - Continue aspirin 81 mg daily - Continue amiodarone 200 mg twice daily and metoprolol   tartrate 50 mg twice daily  Coronary artery disease Cardiac catheterization 11/2023 showed patent coronary arteries with mild diffuse calcific plaquing but no significant stenosis - Today patient is stable without chest pains.  No indication further ischemic evaluation at this time - Continue aspirin 81 mg daily and rosuvastatin  40 mg daily   Hyperlipidemia LDL 36  on 08/2023 and well-controlled - Continue rosuvastatin  40 mg  T2DM A1c 7.7% on 11/2023 Managed on glipizide and Trulicity - Management per PCP  Iron deficiency anemia Hemoglobin 10.5 on 11/2023 - Stable    Cardiac Rehabilitation Eligibility Assessment  The patient is ready to start cardiac rehabilitation pending clearance from the cardiac surgeon.     Dispo:  Return in about 1 month (around 01/03/2024).  Signed, Lum LITTIE Louis, NP

## 2023-12-05 ENCOUNTER — Ambulatory Visit: Payer: Self-pay | Admitting: Emergency Medicine

## 2023-12-05 DIAGNOSIS — Z79899 Other long term (current) drug therapy: Secondary | ICD-10-CM

## 2023-12-05 LAB — BASIC METABOLIC PANEL WITH GFR
BUN/Creatinine Ratio: 23 (ref 10–24)
BUN: 41 mg/dL — ABNORMAL HIGH (ref 8–27)
CO2: 21 mmol/L (ref 20–29)
Calcium: 8.9 mg/dL (ref 8.6–10.2)
Chloride: 104 mmol/L (ref 96–106)
Creatinine, Ser: 1.76 mg/dL — ABNORMAL HIGH (ref 0.76–1.27)
Glucose: 250 mg/dL — ABNORMAL HIGH (ref 70–99)
Potassium: 5 mmol/L (ref 3.5–5.2)
Sodium: 140 mmol/L (ref 134–144)
eGFR: 39 mL/min/1.73 — ABNORMAL LOW (ref >59)

## 2023-12-05 LAB — CBC
Hematocrit: 36.7 % — ABNORMAL LOW (ref 37.5–51.0)
Hemoglobin: 11.6 g/dL — ABNORMAL LOW (ref 13.0–17.7)
MCH: 30.3 pg (ref 26.6–33.0)
MCHC: 31.6 g/dL (ref 31.5–35.7)
MCV: 96 fL (ref 79–97)
Platelets: 217 x10E3/uL (ref 150–450)
RBC: 3.83 x10E6/uL — ABNORMAL LOW (ref 4.14–5.80)
RDW: 16.2 % — ABNORMAL HIGH (ref 11.6–15.4)
WBC: 9.2 x10E3/uL (ref 3.4–10.8)

## 2023-12-05 LAB — PRO B NATRIURETIC PEPTIDE: NT-Pro BNP: 4915 pg/mL — ABNORMAL HIGH (ref 0–486)

## 2023-12-11 ENCOUNTER — Other Ambulatory Visit: Payer: Self-pay | Admitting: *Deleted

## 2023-12-11 DIAGNOSIS — Z79899 Other long term (current) drug therapy: Secondary | ICD-10-CM

## 2023-12-11 MED ORDER — EMPAGLIFLOZIN 10 MG PO TABS: 10.0000 mg | ORAL_TABLET | Freq: Every day | ORAL | 3 refills | Status: DC

## 2023-12-11 MED ORDER — FUROSEMIDE 40 MG PO TABS: 40.0000 mg | ORAL_TABLET | Freq: Every day | ORAL | 3 refills | Status: AC

## 2023-12-11 MED ORDER — EMPAGLIFLOZIN 25 MG PO TABS: 25.0000 mg | ORAL_TABLET | Freq: Every day | ORAL | 3 refills | Status: AC

## 2023-12-11 NOTE — Addendum Note (Signed)
 Addended by: BILLY CAMELIA CROME on: 12/11/2023 05:30 PM   Modules accepted: Orders

## 2023-12-11 NOTE — Addendum Note (Signed)
 Addended by: BILLY CAMELIA CROME on: 12/11/2023 12:05 PM   Modules accepted: Orders

## 2023-12-13 NOTE — Progress Notes (Signed)
 [address] [place] [address]             [phone]       HPI:  Patient returns for routine postoperative follow-up having undergone AVR on 11/08/23.  He was transferred to CIR on POD 7.  He was discharged from CIR on Tuesday, and is now POD 15.  Recently had lasix  held due to hypotension with worsening shortness of breath.  Lasix  was resumed on 11/3 by cardiology. Breathing was better but now bad again starting 2 weeks ago.   Having chills last night and emesis this morning. Wt is 132 lbs today, down from 145 lbs at his last appointment.  Hasn't been able to sleep the last three nights, also has shortness of breath and dizziness when he gets up to the bathroom.  Pain medication: None  Bowel function: Every other day   Allergies as of 12/14/2023       Reactions   Nubain [nalbuphine Hcl]    Muscle contraction        Medication List        Accurate as of December 14, 2023 10:25 AM. If you have any questions, ask your nurse or doctor.          PAUSE taking these medications    amitriptyline  50 MG tablet Wait to take this until your doctor or other care provider tells you to start again. Commonly known as: ELAVIL  Take 1 tablet (50 mg total) by mouth at bedtime.       TAKE these medications    amiodarone 200 MG tablet Commonly known as: PACERONE Take 1 tablet (200 mg total) by mouth 2 (two) times daily.   aspirin EC 81 MG tablet Take 1 tablet (81 mg total) by mouth daily. Swallow whole.   B-complex with vitamin C tablet Take 1 tablet by mouth daily.   bisacodyl  5 MG EC tablet Generic drug: bisacodyl  Take 1 tablet (5 mg total) by mouth daily as needed for moderate constipation.   Blood Glucose Monitoring Suppl Devi 1 each by Does not apply route in the morning, at noon, and at bedtime. May substitute to any manufacturer covered by patient's insurance.   Accu-Chek Guide Me w/Device Kit Inject 1 Application into the skin 4  (four) times daily.   Accu-Chek Guide Test test strip Generic drug: glucose blood 1 each by Other route 4 (four) times daily.   BLOOD GLUCOSE TEST STRIPS Strp 1 each by In Vitro route in the morning, at noon, and at bedtime. May substitute to any manufacturer covered by patient's insurance.   empagliflozin  25 MG Tabs tablet Commonly known as: JARDIANCE  Take 1 tablet (25 mg total) by mouth daily.   FreeStyle Libre 3 Plus Sensor Misc Change sensor every 15 days.   furosemide  40 MG tablet Commonly known as: Lasix  Take 1 tablet (40 mg total) by mouth daily.   glipiZIDE 5 MG tablet Commonly known as: GLUCOTROL Take 1 tablet (5 mg total) by mouth 2 (two) times daily before a meal.   ipratropium-albuterol  0.5-2.5 (3) MG/3ML Soln Commonly known as: DUONEB Take 3 mLs by nebulization every 4 (four) hours as needed.   Lancet Device Misc 1 each by Does not apply route in the morning, at noon, and at bedtime. May substitute to any manufacturer covered by patient's insurance.   levETIRAcetam 500 MG tablet Commonly known as: KEPPRA Take 1 tablet (500 mg total) by mouth 2 (  two) times daily.   Mag-G 500 (27 Mg) MG Tabs tablet Generic drug: magnesium  gluconate Take 0.5 tablets (250 mg total) by mouth at bedtime.   metoprolol  tartrate 50 MG tablet Commonly known as: LOPRESSOR  Take 1 tablet (50 mg total) by mouth 2 (two) times daily.   multivitamin with minerals Tabs tablet Take 1 tablet by mouth daily.   Nebulizer/Tubing/Mouthpiece Kit 1 each by Does not apply route every 4 (four) hours as needed.   Oxycodone  HCl 10 MG Tabs Take 1 tablet (10 mg total) by mouth every 8 (eight) hours as needed.   pantoprazole  40 MG tablet Commonly known as: PROTONIX  Take 1 tablet (40 mg total) by mouth daily.   polyethylene glycol 17 g packet Commonly known as: MIRALAX  / GLYCOLAX  Take 17 g by mouth daily as needed for moderate constipation.   rOPINIRole  0.25 MG tablet Commonly known as:  REQUIP  Take 1 tablet (0.25 mg total) by mouth 3 (three) times daily as needed (restless leg).   rosuvastatin  40 MG tablet Commonly known as: CRESTOR  TAKE 1 TABLET BY MOUTH DAILY. REPLACES ATORVASTATIN  (STOP ATORVASTATIN  IF STILL TAKING)   SUMAtriptan  50 MG tablet Commonly known as: IMITREX  TAKE 1 TABLET (50 MG TOTAL) BY MOUTH DAILY. MAY REPEAT IN 2 HOURS IF HEADACHE PERSISTS OR RECURS.   traZODone 100 MG tablet Commonly known as: DESYREL Take 1 tablet (100 mg total) by mouth at bedtime.   Trulicity 0.75 MG/0.5ML Soaj Generic drug: Dulaglutide Inject 0.75 mg into the skin once a week. Replaces ozempic    vitamin D3 25 MCG tablet Commonly known as: CHOLECALCIFEROL Take 4 tablets (4,000 Units total) by mouth daily.        BP 102/68   Pulse 70   Resp 20   Ht 5' 11 (1.803 m)   Wt 132 lb (59.9 kg)   SpO2 95% Comment: RA  BMI 18.41 kg/m   Physical Exam: General - Appears thinner but energy level appears good CV - No murmurs, RRR Resp - Unlabored on RA, clear breath sounds in all lung fields Abd - Soft, ND/NT Ext - No edema    Imaging: CXR - Clear lung fields. No pleural effusion or pneumothorax.   Assessment/Plan: [name] is a 78 year old man with severe AI who presented to the hospital in decompensated heart failure.  He underwent bioprosthetic AVR and LAAL on 10/8.  He went to inpatient rehab for 6 days and then was discharged home.   He has been having difficulty with shortness of breath recently.  ProBNP was 4000 and his lasix  was resumed per cardiology.  I'm not sure what his dyspnea is from. I don't hear a murmur so hopefully the valve is working well.  Will order a CXR and get some labs today.  Will work on getting his ECHO done sooner that it is currently scheduled (Nov 25).  Will also try albuterol  inhaler in case this is COPD exacerbation.  I will follow-up on test results, but have recommended continued follow up with cardiology and primary care physician.   He will return to my clinic as needed.  Michael Bender Clunes, MD 10:25 AM 12/14/23

## 2023-12-14 ENCOUNTER — Ambulatory Visit (INDEPENDENT_AMBULATORY_CARE_PROVIDER_SITE_OTHER): Payer: Self-pay

## 2023-12-14 ENCOUNTER — Ambulatory Visit
Admission: RE | Admit: 2023-12-14 | Discharge: 2023-12-14 | Disposition: A | Discharge status: Home / Self Care | Source: Ambulatory Visit

## 2023-12-14 VITALS — BP 102/68 | HR 70 | Resp 20 | Ht 71.0 in | Wt 132.0 lb

## 2023-12-14 DIAGNOSIS — I351 Nonrheumatic aortic (valve) insufficiency: Secondary | ICD-10-CM

## 2023-12-14 DIAGNOSIS — Z953 Presence of xenogenic heart valve: Secondary | ICD-10-CM

## 2023-12-14 DIAGNOSIS — R0600 Dyspnea, unspecified: Secondary | ICD-10-CM | POA: Diagnosis not present

## 2023-12-14 DIAGNOSIS — I700 Atherosclerosis of aorta: Secondary | ICD-10-CM | POA: Diagnosis not present

## 2023-12-14 DIAGNOSIS — Z48812 Encounter for surgical aftercare following surgery on the circulatory system: Secondary | ICD-10-CM | POA: Diagnosis not present

## 2023-12-14 MED ORDER — ALBUTEROL SULFATE HFA 108 (90 BASE) MCG/ACT IN AERS: 2.0000 | INHALATION_SPRAY | Freq: Four times a day (QID) | RESPIRATORY_TRACT | 2 refills | Status: AC | PRN

## 2023-12-15 ENCOUNTER — Encounter (HOSPITAL_BASED_OUTPATIENT_CLINIC_OR_DEPARTMENT_OTHER): Payer: Self-pay

## 2023-12-15 ENCOUNTER — Other Ambulatory Visit: Payer: Self-pay

## 2023-12-15 ENCOUNTER — Emergency Department (HOSPITAL_BASED_OUTPATIENT_CLINIC_OR_DEPARTMENT_OTHER)
Admission: EM | Admit: 2023-12-15 | Discharge: 2023-12-15 | Disposition: A | Discharge status: Home / Self Care | Source: Ambulatory Visit | Attending: Emergency Medicine | Admitting: Emergency Medicine

## 2023-12-15 ENCOUNTER — Telehealth: Payer: Self-pay | Admitting: *Deleted

## 2023-12-15 DIAGNOSIS — Z7982 Long term (current) use of aspirin: Secondary | ICD-10-CM | POA: Insufficient documentation

## 2023-12-15 DIAGNOSIS — E1122 Type 2 diabetes mellitus with diabetic chronic kidney disease: Secondary | ICD-10-CM | POA: Insufficient documentation

## 2023-12-15 DIAGNOSIS — I130 Hypertensive heart and chronic kidney disease with heart failure and stage 1 through stage 4 chronic kidney disease, or unspecified chronic kidney disease: Secondary | ICD-10-CM | POA: Diagnosis not present

## 2023-12-15 DIAGNOSIS — N189 Chronic kidney disease, unspecified: Secondary | ICD-10-CM | POA: Diagnosis not present

## 2023-12-15 DIAGNOSIS — I509 Heart failure, unspecified: Secondary | ICD-10-CM | POA: Diagnosis not present

## 2023-12-15 DIAGNOSIS — N179 Acute kidney failure, unspecified: Secondary | ICD-10-CM | POA: Insufficient documentation

## 2023-12-15 DIAGNOSIS — J449 Chronic obstructive pulmonary disease, unspecified: Secondary | ICD-10-CM | POA: Insufficient documentation

## 2023-12-15 DIAGNOSIS — Z79899 Other long term (current) drug therapy: Secondary | ICD-10-CM | POA: Insufficient documentation

## 2023-12-15 DIAGNOSIS — R7989 Other specified abnormal findings of blood chemistry: Secondary | ICD-10-CM | POA: Diagnosis not present

## 2023-12-15 LAB — BASIC METABOLIC PANEL WITH GFR
Anion gap: 14 (ref 5–15)
BUN/Creatinine Ratio: 24 (ref 10–24)
BUN: 66 mg/dL — ABNORMAL HIGH (ref 8–27)
BUN: 59 mg/dL — ABNORMAL HIGH (ref 8–23)
CO2: 21 mmol/L (ref 20–29)
CO2: 23 mmol/L (ref 22–32)
Calcium: 9.1 mg/dL (ref 8.6–10.2)
Calcium: 9.1 mg/dL (ref 8.9–10.3)
Chloride: 100 mmol/L (ref 96–106)
Chloride: 99 mmol/L (ref 98–111)
Creatinine, Ser: 2.71 mg/dL — ABNORMAL HIGH (ref 0.76–1.27)
Creatinine, Ser: 2.65 mg/dL — ABNORMAL HIGH (ref 0.61–1.24)
GFR, Estimated: 24 mL/min — ABNORMAL LOW (ref >60)
Glucose, Bld: 158 mg/dL — ABNORMAL HIGH (ref 70–99)
Glucose: 204 mg/dL — ABNORMAL HIGH (ref 70–99)
Potassium: 5.4 mmol/L — ABNORMAL HIGH (ref 3.5–5.2)
Potassium: 4.9 mmol/L (ref 3.5–5.1)
Sodium: 142 mmol/L (ref 134–144)
Sodium: 136 mmol/L (ref 135–145)
eGFR: 23 mL/min/1.73 — ABNORMAL LOW (ref >59)

## 2023-12-15 LAB — CBC WITH DIFFERENTIAL/PLATELET
Abs Immature Granulocytes: 0.03 K/uL (ref 0.00–0.07)
Basophils Absolute: 0 K/uL (ref 0.0–0.1)
Basophils Relative: 0 %
Eosinophils Absolute: 0 K/uL (ref 0.0–0.5)
Eosinophils Relative: 0 %
HCT: 37.3 % — ABNORMAL LOW (ref 39.0–52.0)
Hemoglobin: 12.3 g/dL — ABNORMAL LOW (ref 13.0–17.0)
Immature Granulocytes: 0 %
Lymphocytes Relative: 16 %
Lymphs Abs: 1.4 K/uL (ref 0.7–4.0)
MCH: 30 pg (ref 26.0–34.0)
MCHC: 33 g/dL (ref 30.0–36.0)
MCV: 91 fL (ref 80.0–100.0)
Monocytes Absolute: 0.8 K/uL (ref 0.1–1.0)
Monocytes Relative: 9 %
Neutro Abs: 6.6 K/uL (ref 1.7–7.7)
Neutrophils Relative %: 75 %
Platelets: 166 K/uL (ref 150–400)
RBC: 4.1 MIL/uL — ABNORMAL LOW (ref 4.22–5.81)
RDW: 16.5 % — ABNORMAL HIGH (ref 11.5–15.5)
WBC: 8.9 K/uL (ref 4.0–10.5)
nRBC: 0 % (ref 0.0–0.2)

## 2023-12-15 MED ORDER — SODIUM CHLORIDE 0.9 % IV BOLUS
500.0000 mL | Freq: Once | INTRAVENOUS | Status: AC
  Administered 2023-12-15: 500 mL via INTRAVENOUS

## 2023-12-15 NOTE — Discharge Instructions (Addendum)
 You were seen in the emergency department for your abnormal labs.  Your kidney function is elevated compared to your normal and this is likely due to not drinking as much water recently and restarting the Lasix .  You should hold your Lasix  until further instructed by your doctor and make sure you are drinking plenty of fluids over the weekend.  You should have your kidney function rechecked by your doctor next week to make sure that it is continuing to improve.  You should return to the emergency department if you are developing worsening weakness, you are lightheaded or pass out, you have nausea and vomiting or if you have any other new or concerning symptoms.

## 2023-12-15 NOTE — Telephone Encounter (Signed)
 Contacted patient's son, [name], per Dr. Daniel. Advised son that patient's labs came back as abnormal from [facility] yesterday. Advised that patient be taken to the ED for rehydration and further work up. Son verbalized understanding.

## 2023-12-15 NOTE — ED Provider Notes (Signed)
 [place] EMERGENCY DEPARTMENT AT [facility] Provider Note   CSN: [id number] Arrival date & time: 12/15/23  1226     Patient presents with: Abnormal Labs   Michael Bender is a 78 y.o. male.   Patient is a 78 year old male with past medical history of aortic stenosis status post repair 1 month ago, CHF, COPD, hypertension, CKD, diabetes presenting to the emergency department with abnormal labs.  The patient states that he saw his CT surgeon yesterday who ordered postop labs.  He states that he called him today and told him that he had abnormal kidney function and recommended that he come to the ER.  The patient had seen his cardiologist about 10 days ago and was told that he looked volume overloaded and prescribed him Lasix .  He states that he was not on Lasix  in the hospital but did not go home with the Lasix .  He states he did not fill this prescription until yesterday and took 1 dose prior to getting his labs drawn.  He states that he did urinate a lot yesterday but does not think he has urinated at all today.  He states that he has not been drinking a lot of fluids but has still been eating normally.  He denies any vomiting or diarrhea.  The history is provided by the patient and a relative.       Prior to Admission medications   Medication Sig Start Date End Date Taking? Authorizing Provider  ACCU-CHEK GUIDE TEST test strip 1 each by Other route 4 (four) times daily. 10/12/23   [provider]  albuterol  (VENTOLIN  HFA) 108 (90 Base) MCG/ACT inhaler Inhale 2 puffs into the lungs every 6 (six) hours as needed for wheezing or shortness of breath. 12/14/23   [name], MD  amiodarone (PACERONE) 200 MG tablet Take 1 tablet (200 mg total) by mouth 2 (two) times daily. 11/20/23   [name], NP  amitriptyline  (ELAVIL ) 50 MG tablet Take 1 tablet (50 mg total) by mouth at bedtime. 03/22/23   [name], MD  aspirin EC 81 MG tablet Take 1 tablet (81 mg  total) by mouth daily. Swallow whole. 11/21/23   [name], NP  B Complex-C (B-COMPLEX WITH VITAMIN C) tablet Take 1 tablet by mouth daily. 11/21/23   [name], NP  bisacodyl  5 MG EC tablet Take 1 tablet (5 mg total) by mouth daily as needed for moderate constipation. 10/25/23   [name], MD  Blood Glucose Monitoring Suppl (ACCU-CHEK GUIDE ME) w/Device KIT Inject 1 Application into the skin 4 (four) times daily. 10/12/23   [provider]  Blood Glucose Monitoring Suppl DEVI 1 each by Does not apply route in the morning, at noon, and at bedtime. May substitute to any manufacturer covered by patient's insurance. 10/12/23   [name], MD  Continuous Glucose Sensor (FREESTYLE LIBRE 3 PLUS SENSOR) MISC Change sensor every 15 days. 10/12/23   [name], MD  Dulaglutide (TRULICITY) 0.75 MG/0.5ML SOAJ Inject 0.75 mg into the skin once a week. Replaces ozempic  11/27/23   [name], MD  empagliflozin  (JARDIANCE ) 25 MG TABS tablet Take 1 tablet (25 mg total) by mouth daily. 12/11/23   [name], NP  furosemide  (LASIX ) 40 MG tablet Take 1 tablet (40 mg total) by mouth daily. 12/11/23   [name], NP  glipiZIDE (GLUCOTROL) 5 MG tablet Take 1 tablet (5 mg total) by mouth 2 (two)  times daily before a meal. 11/27/23   [name], MD  Glucose Blood (BLOOD GLUCOSE TEST STRIPS) STRP 1 each by In Vitro route in the morning, at noon, and at bedtime. May substitute to any manufacturer covered by patient's insurance. 11/15/23 12/15/23  [name], PA-C  ipratropium-albuterol  (DUONEB) 0.5-2.5 (3) MG/3ML SOLN Take 3 mLs by nebulization every 4 (four) hours as needed. 04/19/23   [name], MD  Lancet Device MISC 1 each by Does not apply route in the morning, at noon, and at bedtime. May substitute to any manufacturer covered by patient's insurance. 11/15/23 12/15/23  [name], PA-C  levETIRAcetam (KEPPRA) 500 MG tablet  Take 1 tablet (500 mg total) by mouth 2 (two) times daily. 11/15/23   [name], PA-C  magnesium  gluconate (MAGONATE) 500 (27 Mg) MG TABS tablet Take 0.5 tablets (250 mg total) by mouth at bedtime. 11/20/23   [name], NP  metoprolol  tartrate (LOPRESSOR ) 50 MG tablet Take 1 tablet (50 mg total) by mouth 2 (two) times daily. 11/15/23   Zimmerman, [name], PA-C  Multiple Vitamin (MULTIVITAMIN WITH MINERALS) TABS tablet Take 1 tablet by mouth daily. 11/16/23   [name], PA-C  Oxycodone  HCl 10 MG TABS Take 1 tablet (10 mg total) by mouth every 8 (eight) hours as needed. 11/27/23   [name], MD  pantoprazole  (PROTONIX ) 40 MG tablet Take 1 tablet (40 mg total) by mouth daily. 05/03/23   [name], MD  polyethylene glycol (MIRALAX  / GLYCOLAX ) 17 g packet Take 17 g by mouth daily as needed for moderate constipation. 11/02/22   Cheryle Page, MD  Respiratory Therapy Supplies (NEBULIZER/TUBING/MOUTHPIECE) KIT 1 each by Does not apply route every 4 (four) hours as needed. 04/19/23   [name], MD  rOPINIRole  (REQUIP ) 0.25 MG tablet Take 1 tablet (0.25 mg total) by mouth 3 (three) times daily as needed (restless leg). 11/15/23   [name], PA-C  rosuvastatin  (CRESTOR ) 40 MG tablet TAKE 1 TABLET BY MOUTH DAILY. REPLACES ATORVASTATIN  (STOP ATORVASTATIN  IF STILL TAKING) 04/13/23   [name], MD  SUMAtriptan  (IMITREX ) 50 MG tablet TAKE 1 TABLET (50 MG TOTAL) BY MOUTH DAILY. MAY REPEAT IN 2 HOURS IF HEADACHE PERSISTS OR RECURS. 11/18/22   [name], MD  traZODone (DESYREL) 100 MG tablet Take 1 tablet (100 mg total) by mouth at bedtime. 11/20/23   [name], NP  vitamin D3 (CHOLECALCIFEROL) 25 MCG tablet Take 4 tablets (4,000 Units total) by mouth daily. 11/22/23   [name], NP    Allergies: Nubain [nalbuphine hcl]    Review of Systems  Updated Vital Signs BP (!) 117/59 (BP Location: Right Arm)   Pulse (!) 54    Temp 97.7 F (36.5 C) (Oral)   Resp 18   SpO2 100%   Physical Exam Vitals and nursing note reviewed.  Constitutional:      General: He is not in acute distress.    Appearance: Normal appearance.  HENT:     Head: Normocephalic and atraumatic.     Nose: Nose normal.     Mouth/Throat:     Mouth: Mucous membranes are moist.     Pharynx: Oropharynx is clear.  Eyes:     Extraocular Movements: Extraocular movements intact.     Conjunctiva/sclera: Conjunctivae normal.  Cardiovascular:     Rate and Rhythm: Normal rate and regular rhythm.     Heart sounds: Normal heart sounds.  Pulmonary:     Effort: Pulmonary effort is normal.     Breath sounds: Normal breath sounds.  Abdominal:     General: Abdomen is flat.     Palpations: Abdomen is soft.     Tenderness: There is no abdominal tenderness.  Musculoskeletal:        General: Normal range of motion.     Cervical back: Normal range of motion.     Right lower leg: No edema.     Left lower leg: No edema.  Skin:    General: Skin is warm and dry.  Neurological:     General: No focal deficit present.     Mental Status: He is alert and oriented to person, place, and time.  Psychiatric:        Mood and Affect: Mood normal.        Behavior: Behavior normal.     (all labs ordered are listed, but only abnormal results are displayed) Labs Reviewed  BASIC METABOLIC PANEL WITH GFR - Abnormal; Notable for the following components:      Result Value   Glucose, Bld 158 (*)    BUN 59 (*)    Creatinine, Ser 2.65 (*)    GFR, Estimated 24 (*)    All other components within normal limits  CBC WITH DIFFERENTIAL/PLATELET - Abnormal; Notable for the following components:   RBC 4.10 (*)    Hemoglobin 12.3 (*)    HCT 37.3 (*)    RDW 16.5 (*)    All other components within normal limits    EKG: EKG Interpretation Date/Time:  Friday December 15 2023 17:11:19 EST Ventricular Rate:  56 PR Interval:  179 QRS Duration:  122 QT  Interval:  533 QTC Calculation: 515 R Axis:   72  Text Interpretation: Sinus rhythm Nonspecific intraventricular conduction delay Nonspecific T abnormalities, lateral leads No significant change since last tracing Confirmed by Ellouise Fine ([id number]) on 12/15/2023 5:20:06 PM  Radiology: DG Chest 2 View Result Date: 12/14/2023 CLINICAL DATA:  Dyspnea.  AVR. EXAM: CHEST - 2 VIEW COMPARISON:  November 23, 2023. FINDINGS: The heart size and mediastinal contours are unchanged. Aortic valve replacement and left atrial appendage clip. Median sternotomy. Aortic atherosclerosis. No focal consolidation, pleural effusion, or pneumothorax. No acute osseous abnormality. IMPRESSION: 1. No acute cardiopulmonary findings. 2.  Aortic Atherosclerosis (ICD10-I70.0). Electronically Signed   By: Harrietta Sherry M.D.   On: 12/14/2023 11:32     Procedures   Medications Ordered in the ED  sodium chloride  0.9 % bolus 500 mL (0 mLs Intravenous Stopped 12/15/23 1814)    Clinical Course as of 12/15/23 1920  Fri Dec 15, 2023  1804 Cr stable from yesterday, up to 2.65 from baseline around 1.65, suspect due to lasix  and decreased fluid intake. Will use shared decision making with patient for disposition. [VK]  1915 Orthostatics negative, patient is stable for discharge home. Son will help facilitate follow up next week for repeat labs and recommended to hold lasix  until further instructed. [VK]    Clinical Course User Index [VK] Kingsley, Timmi Devora K, DO                                 Medical Decision Making This patient presents to the ED with chief complaint(s) of abnormal labs with pertinent past medical history of aortic stenosis status post recent repair, CHF, CKD, COPD, hypertension, diabetes which further complicates the presenting complaint. The  complaint involves an extensive differential diagnosis and also carries with it a high risk of complications and morbidity.    The differential diagnosis includes  AKI, dehydration, medication side effect, electrolyte derangement, arrhythmia, anemia  Additional history obtained: Additional history obtained from family Records reviewed outpatient cardiology and CT surgery records  ED Course and Reassessment: On patient's arrival he is hemodynamically stable in no acute distress, has no evidence of volume overload on his exam.  Patient will have repeat labs drawn today as well as EKG to evaluate for any evidence of hyperkalemia.  He will be started on gentle fluids and will be closely reassessed.  Independent labs interpretation:  The following labs were independently interpreted: AKI on CKD, stable from yesterday  Independent visualization of imaging: - N/A  Consultation: - Consulted or discussed management/test interpretation w/ external professional: N/A  Consideration for admission or further workup: using shared decision making with the patient, prefers plan for discharge home with holding lasix  and close outpatient follow up for repeat labs next week Social Determinants of health: N/A    Amount and/or Complexity of Data Reviewed Labs: ordered.       Final diagnoses:  AKI (acute kidney injury)    ED Discharge Orders     None          Kingsley, Milyn Stapleton K, DO 12/15/23 1920

## 2023-12-15 NOTE — ED Triage Notes (Addendum)
 Abnormal labs - per Dr. Con Jacob thoracic surgeon. 1 month post Aortic valve repair. Possible dehydration and other. Decreased GFR, elevated creat.

## 2023-12-25 ENCOUNTER — Ambulatory Visit: Payer: Self-pay | Admitting: Emergency Medicine

## 2023-12-25 DIAGNOSIS — Z79899 Other long term (current) drug therapy: Secondary | ICD-10-CM

## 2023-12-26 ENCOUNTER — Ambulatory Visit (HOSPITAL_COMMUNITY)
Admission: RE | Admit: 2023-12-26 | Discharge: 2023-12-26 | Disposition: A | Discharge status: Home / Self Care | Source: Ambulatory Visit | Attending: Internal Medicine | Admitting: Internal Medicine

## 2023-12-26 DIAGNOSIS — Z952 Presence of prosthetic heart valve: Secondary | ICD-10-CM | POA: Diagnosis not present

## 2023-12-26 DIAGNOSIS — Z79899 Other long term (current) drug therapy: Secondary | ICD-10-CM | POA: Diagnosis not present

## 2023-12-26 LAB — ECHOCARDIOGRAM COMPLETE
AR max vel: 1.93 cm2
AV Area VTI: 2.16 cm2
AV Area mean vel: 1.92 cm2
AV Mean grad: 8.3 mmHg
AV Peak grad: 16 mmHg
Ao pk vel: 2 m/s
Area-P 1/2: 2.44 cm2
MV M vel: 3.84 m/s
MV Peak grad: 59 mmHg
Radius: 0.47 cm
S' Lateral: 2.95 cm

## 2023-12-26 LAB — BASIC METABOLIC PANEL WITH GFR
BUN/Creatinine Ratio: 17 (ref 10–24)
BUN: 49 mg/dL — ABNORMAL HIGH (ref 8–27)
CO2: 23 mmol/L (ref 20–29)
Calcium: 8.4 mg/dL — ABNORMAL LOW (ref 8.6–10.2)
Chloride: 101 mmol/L (ref 96–106)
Creatinine, Ser: 2.83 mg/dL — ABNORMAL HIGH (ref 0.76–1.27)
Glucose: 175 mg/dL — ABNORMAL HIGH (ref 70–99)
Potassium: 4 mmol/L (ref 3.5–5.2)
Sodium: 137 mmol/L (ref 134–144)
eGFR: 22 mL/min/1.73 — ABNORMAL LOW (ref >59)

## 2024-01-01 ENCOUNTER — Other Ambulatory Visit: Payer: Self-pay | Admitting: Nephrology

## 2024-01-01 ENCOUNTER — Ambulatory Visit: Payer: Self-pay | Admitting: Internal Medicine

## 2024-01-01 DIAGNOSIS — D509 Iron deficiency anemia, unspecified: Secondary | ICD-10-CM | POA: Diagnosis not present

## 2024-01-01 DIAGNOSIS — N184 Chronic kidney disease, stage 4 (severe): Secondary | ICD-10-CM | POA: Diagnosis not present

## 2024-01-01 DIAGNOSIS — I129 Hypertensive chronic kidney disease with stage 1 through stage 4 chronic kidney disease, or unspecified chronic kidney disease: Secondary | ICD-10-CM | POA: Diagnosis not present

## 2024-01-01 DIAGNOSIS — N2581 Secondary hyperparathyroidism of renal origin: Secondary | ICD-10-CM | POA: Diagnosis not present

## 2024-01-02 ENCOUNTER — Telehealth: Payer: Self-pay | Admitting: Emergency Medicine

## 2024-01-02 NOTE — Telephone Encounter (Signed)
 Lmom, returned pt call. Waiting on a return call.

## 2024-01-02 NOTE — Telephone Encounter (Signed)
Pt returning call from yesterday regarding lab results. Please advise

## 2024-01-04 LAB — LAB REPORT - SCANNED
Calcium: 8.1
EGFR: 26
PTH: 45

## 2024-01-05 ENCOUNTER — Other Ambulatory Visit: Payer: Self-pay | Admitting: Medical Oncology

## 2024-01-05 DIAGNOSIS — D500 Iron deficiency anemia secondary to blood loss (chronic): Secondary | ICD-10-CM

## 2024-01-05 NOTE — Progress Notes (Signed)
 Lab ordered entered several months ago.

## 2024-01-08 ENCOUNTER — Ambulatory Visit: Admitting: Internal Medicine

## 2024-01-08 ENCOUNTER — Other Ambulatory Visit

## 2024-01-09 ENCOUNTER — Other Ambulatory Visit (HOSPITAL_COMMUNITY)

## 2024-01-12 ENCOUNTER — Encounter: Attending: Physical Medicine and Rehabilitation | Admitting: Physical Medicine and Rehabilitation

## 2024-01-17 ENCOUNTER — Telehealth: Payer: Self-pay | Admitting: Internal Medicine

## 2024-01-17 ENCOUNTER — Encounter: Payer: Self-pay | Admitting: Emergency Medicine

## 2024-01-17 ENCOUNTER — Ambulatory Visit: Attending: Emergency Medicine | Admitting: Emergency Medicine

## 2024-01-17 VITALS — BP 138/80 | HR 74 | Ht 70.0 in | Wt 139.0 lb

## 2024-01-17 DIAGNOSIS — E785 Hyperlipidemia, unspecified: Secondary | ICD-10-CM

## 2024-01-17 DIAGNOSIS — Z952 Presence of prosthetic heart valve: Secondary | ICD-10-CM | POA: Diagnosis not present

## 2024-01-17 DIAGNOSIS — I351 Nonrheumatic aortic (valve) insufficiency: Secondary | ICD-10-CM | POA: Diagnosis not present

## 2024-01-17 DIAGNOSIS — I5020 Unspecified systolic (congestive) heart failure: Secondary | ICD-10-CM | POA: Diagnosis not present

## 2024-01-17 DIAGNOSIS — Z79899 Other long term (current) drug therapy: Secondary | ICD-10-CM | POA: Diagnosis not present

## 2024-01-17 DIAGNOSIS — I480 Paroxysmal atrial fibrillation: Secondary | ICD-10-CM | POA: Diagnosis not present

## 2024-01-17 DIAGNOSIS — I2510 Atherosclerotic heart disease of native coronary artery without angina pectoris: Secondary | ICD-10-CM | POA: Diagnosis not present

## 2024-01-17 DIAGNOSIS — N1832 Chronic kidney disease, stage 3b: Secondary | ICD-10-CM | POA: Diagnosis not present

## 2024-01-17 NOTE — Patient Instructions (Signed)
 Medication Instructions:  STOP TAKING AMIODARONE . CONTINUE ALL OTHER CURRENT MEDICATION THERAPY.  Lab Work: PRO BNP TO BE DONE TOMORROW AT PCPs OFFICE  Testing/Procedures: NONE  Follow-Up: At [facility], you and your health needs are our priority.  As part of our continuing mission to provide you with exceptional heart care, our providers are all part of one team.  This team includes your primary Cardiologist (physician) and Advanced Practice Providers or APPs (Physician Assistants and Nurse Practitioners) who all work together to provide you with the care you need, when you need it.  Your next appointment:   3 MONTHS  Provider:   Vina Gull, MD OR Lum Louis, NP

## 2024-01-17 NOTE — Progress Notes (Signed)
 Cardiology Office Note:    Date:  01/17/2024  ID:  Michael Bender, DOB April 30, 1945, MRN [id number] PCP: Michael Bernardino MATSU, MD  [facility] Providers Cardiologist:  Michael Gull, MD Cardiology APP:  Michael Lum CROME, NP       Patient Profile:       Chief Complaint: 1 month follow-up History of Present Illness:  Michael Bender is a 78 y.o. male with visit-pertinent history of severe aortic regurgitation s/p AVR, hypertension, hyperlipidemia, HFmrEF, orthostatic hypotension with recurrent syncope, paroxysmal atrial fibrillation, postoperative atrial fibrillation, T2DM, rhabdomyolysis, BPH, PAD, emphysema, GERD, DJD   He was admitted 10/27/2022 through 11/02/2022 for AKI, bladder outlet obstruction, and traumatic rhabdomyolysis.  CK on admission was 2828 and was started on IV fluids with improvement.  His creatinine was 4.2 on admission and down to 1.61 at discharge.   He was admitted 11/05/2022 through 11/14/2022 with recurrent syncopal episode associated with generalized weakness and encephalopathy and found to have MSSA bacteremia on blood cultures on 11/05/2022 and septic arthritis.  He was started empirically on antibiotics.  ID was consulted and MRI of the left wrist was done concerning for septic arthritis.  Orthopedic surgery was consulted and he underwent left wrist capsulotomy and washout on 11/09/2022.  MRI of C-spine was done that showed discitis of C5-C6.  PICC line was inserted on 11/13/2022 and he was to continue antibiotics for 6 weeks.  Syncope felt to be due to orthostatic hypotension.  He was also found to be in new onset atrial fibrillation with RVR.  He was started on IV diltiazem  and spontaneously converted and switched to oral metoprolol  at discharge.  It was noted he was not a candidate for anticoagulation due to recurrent falls.  He was also newly diagnosed with T2DM with A1c of 6.5 and discharged on oral metformin .   He was admitted on 04/20/2023 through 04/24/2023  with acute on chronic heart failure.  He presented with worsening exertional shortness of breath.  BNP was 2352.  Checks x-ray showed pulmonary congestion.  He was started on IV Lasix .  Echocardiogram showed newly reduced LVEF 40 to 45%, grade 1 diastolic dysfunction, normal RV. Had AKI so cardiac cath was deferred and diuretics were held at discharge.  Entresto  and spironolactone  were not started due to his renal function and beta-blocker use was not started due to severe COPD.   He followed up with advanced heart failure clinic on 05/01/2023.  It is unclear the etiology for the reduction in his EF.  Could be related to recent MSSA sepsis versus uncontrolled hypertension.  He was NYHA class II and volume status okay during clinic visit.  He was started on losartan  25 mg daily.  He was continued on hydralazine  50 mg 3 times daily and Imdur  30 mg daily.  SGLT2i was avoided due to concern for hygiene and volume depletion.  Cardiac MRI was ordered to evaluate for infiltrative disease and is currently pending.   He was seen in clinic on 06/07/2023.  He is doing well at the time.  His blood pressure was elevated in office at 164/74.  His losartan  was discontinued and he was started on Entresto  49-51 mg twice daily and spironolactone  12.5 mg daily.  Follow-up lab work on 05/19/2023 showed worsening creatinine of 1.6.  Spironolactone  was discontinued at that time.   Cardiac MRI 06/06/2023 showed LVEF 42%, moderate to severe aortic regurgitation consistent with aortic regurgitation associated with left ventricular dysfunction, evidence of mild ascending dilation of aorta  measuring 43 mm.   Patient was seen on 07/20/2023.  Patient was overall asymptomatic with chronic stable dyspnea.  He was started on bisoprolol  5 mg daily.  TEE was scheduled for further evaluation of his severe aortic regurgitation.  TEE was completed on 07/24/2023 showing oscillating linear density (0.7-0.8 cm) on left coronary cusp of aortic valve  concerning for vegetation and severe aortic regurgitation, LVEF 50 to 55%, no RWMA, mild mitral valve regurgitation, moderate grade 3 plaque involving the ascending aorta.  Blood cultures were drawn and he was referred to infectious disease.  Seen by ID on 08/03/2023 for suspected the AV vegetation (with negative blood cultures and no sign of sepsis).  Per ID it was suspected that the AV vegetation is residual from prior infection on 11/2018 for MSSA BSI.  Plan was discussed with cardiology and was agreed not to start IV antibiotics given likelihood of residual infection.   Labs drawn with PCP on 7/14 showing creatinine of 2.18, GFR 38, and potassium 5.5.  His Jardiance  was subsequently held at that time.   Seen in clinic on 08/29/2023.  Patient was doing well at the time without any acute complaints.  Remains relatively asymptomatic.  Repeat BMET was placed showing continued elevated potassium of 5.4 and creatinine of 2.21.  He was given a coupon for Lokelma  and Entresto  was reduced to 24-26 mg twice daily.  He was referred to nephrology.   He was seen by cardiothoracic surgery on 08/30/2023.  Per cardiothoracic surgery aortic valve replacement was suggested to prevent progressive left ventricular dysfunction and worsening heart failure symptoms related to severe aortic insufficiency.  This would have required open surgery because he is not a candidate for TAVR with a possible vegetation and pure aortic insufficiency.  Patient reported he would like to hold off on surgery for now we will continue medical therapy and observation.   He presented to the ED on 10/1 with complaints of exertional dyspnea.  Reported he could barely walk 5 feet without getting short of breath.  He had an AKI on CKD with creatinine upon arrival at 3, BNP 634 and troponin 67, 62 thought to be due to demand.  Echocardiogram 11/02/2023 read LVEF 60 to 65%.  He underwent cardiac catheterization on 11/06/2023 showing patent coronary arteries  with mild diffuse calcific plaquing but no significant stenosis.  He then underwent aortic valve replacement with a 21 mm Edwards Inspiris bioprosthetic valve and left atrial appendage ligation on 11/08/2023.  After procedure patient was noted to have decreased level consciousness, disorientation, right facial droop along with bilateral arm and lower extremity weakness.  He had right side neglect on exam.  Code stroke was initiated.  CT scan of the head and neck were negative with any acute findings.  He was seen by neurology and loaded with Keppra  for suspected seizure activity.  An EEG was showing no seizures.  His mental status improved significantly.  MRI of the brain on 11/10/2023 showed moderately severe chronic small vessel ischemic disease but no acute abnormalities.  His creatinine level improved and he was started on Lasix  to help facilitate diuresis.  He developed atrial fibrillation with RVR and started on IV amiodarone .  He became hypertensive so Lopressor  was increased to 50 mg twice daily and amlodipine  was increased to 10 mg daily.  He went to inpatient rehab for 6 days and then was discharged home.   He followed up with cardiothoracic surgery on 11/23/2023.  He was doing well without acute complaints.  He had been instructed to stop amitriptyline .   Followed up with PCP on 11/27/2023.  He was noted to be hypotensive.  His amlodipine  and furosemide  were discontinued.  He was to continue to hold Jardiance .  He was seen for follow-up on 12/04/2023.  He reported worsening shortness of breath over the past week aligning with the time his furosemide  was held.  He was started on furosemide  40 mg daily.  His proBNP was elevated at 4915.  Echocardiogram 12/26/2023 showed LVEF 60 to 65%, no RWMA, grade 1 DD, RV function and size normal, trivial MR, AV prosthesis functioning properly, borderline dilation of ascending aorta measuring 39 mm   Discussed the use of AI scribe software for clinical note  transcription with the patient, who gave verbal consent to proceed.  History of Present Illness Michael Bender is a 79 year old male with aortic valve replacement and heart failure who presents for follow-up.  Today he tells me he is doing well without acute cardiovascular concerns.  Reports his shortness of breath has vastly improved since his last office visit.  He currently uses Lasix  as needed and takes Jardiance  for volume control.  Recently he has been weighing himself every morning without any weight gain therefore he is not taking his Lasix  as of recent.  Today he denies any dyspnea on exertion, orthopnea, or PND.  He denies any lower extremity swelling.  He has severe insomnia since hospital discharge, with only one night of sleep. Melatonin has not helped. Trazodone  was effective in the hospital. He has dizziness with head movement that he attributes to lack of sleep.  He has history of COPD but denies significant shortness of breath. He has not resumed much physical activity because of exhaustion from poor sleep.  Furthermore he denies any episodes concerning for recurrent atrial fibrillation.  He is without syncope, presyncope, melena, hematochezia, lightheadedness  Review of systems:  Please see the history of present illness. All other systems are reviewed and otherwise negative.      Studies Reviewed:    EKG Interpretation Date/Time:  Wednesday January 17 2024 10:19:49 EST Ventricular Rate:  71 PR Interval:  156 QRS Duration:  74 QT Interval:  418 QTC Calculation: 454 R Axis:   82  Text Interpretation: Normal sinus rhythm Possible Left atrial enlargement Nonspecific T wave abnormality When compared with ECG of 15-Dec-2023 17:11, PREVIOUS ECG IS PRESENT Confirmed by Michael Dixon [phone]) on 01/17/2024 12:48:29 PM    Echocardiogram 12/26/2023  1. Left ventricular ejection fraction, by estimation, is 60 to 65%. The  left ventricle has normal function. The left  ventricle has no regional  wall motion abnormalities. Left ventricular diastolic parameters are  consistent with Grade I diastolic  dysfunction (impaired relaxation).   2. Right ventricular systolic function is normal. The right ventricular  size is normal.   3. The mitral valve is normal in structure. Trivial mitral valve  regurgitation. No evidence of mitral stenosis.   4. The aortic valve has been repaired/replaced. Aortic valve  regurgitation is trivial. No aortic stenosis is present. There is a 27 mm  Edwards Inspiris Resilia valve present in the aortic position. Procedure  Date: 11/08/23.   5. Aortic dilatation noted. There is borderline dilatation of the  ascending aorta, measuring 39 mm.   6. The inferior vena cava is normal in size with greater than 50%  respiratory variability, suggesting right atrial pressure of 3 mmHg.   Echocardiogram 11/02/2023 1. Technically difficult study, images off axis  2. Left ventricular ejection fraction, by estimation, is 60 to 65%. Left  ventricular ejection fraction by PLAX is 61 %. The left ventricle has  normal function. The left ventricle has no regional wall motion  abnormalities. Left ventricular diastolic  parameters are consistent with Grade I diastolic dysfunction (impaired  relaxation).   3. Right ventricular systolic function is normal. The right ventricular  size is normal.   4. The mitral valve is normal in structure. No evidence of mitral valve  regurgitation.   5. Calcified aortic root. There appears to be eccentric, posteriorly  directed regurgitation (worst on image 77). The aortic valve was not well  visualized. Aortic valve regurgitation is moderate. Aortic valve  sclerosis/calcification is present, without  any evidence of aortic stenosis.    TEE 07/24/2023  1. Oscillating, linear density (0.7-0.8 cm) on left coronary cusp of  aortic valve concerning for vegetation; severe AI (best seen on  transgastric views).   2.  Left ventricular ejection fraction, by estimation, is 50 to 55%. The  left ventricle has low normal function. The left ventricle has no regional  wall motion abnormalities. The left ventricular internal cavity size was  mildly dilated.   3. Right ventricular systolic function is mildly reduced. The right  ventricular size is normal.   4. Left atrial size was moderately dilated. No left atrial/left atrial  appendage thrombus was detected.   5. The mitral valve is normal in structure. Mild mitral valve  regurgitation.   6. The aortic valve is tricuspid. Aortic valve regurgitation is severe.   7. There is Moderate (Grade III) plaque involving the descending aorta.   8. 3D performed of the aortic valve.    Cardiac MRI 06/06/2023 IMPRESSION: 1. Mild decrease left ventricular systolic function (LVEF =42%).   2. Moderate to severe regurgitation with regurgitant fraction 35% with moderate left ventricular dilation. This is markedly different that color Doppler evaluation on recent echocardiogram. Consider TEE assessment as this MRI is consistent with aortic regurgitation associated left ventricular dysfunction.   3. Evidence of mild ascending aortic dilation, 43 mm.   Echocardiogram 04/21/2023  1. Left ventricular ejection fraction, by estimation, is 40 to 45%. The  left ventricle has mildly decreased function. The left ventricle  demonstrates global hypokinesis. There is mild left ventricular  hypertrophy. Left ventricular diastolic parameters  are consistent with Grade I diastolic dysfunction (impaired relaxation).   2. Right ventricular systolic function is normal. The right ventricular  size is normal. There is mildly elevated pulmonary artery systolic  pressure. The estimated right ventricular systolic pressure is 38.0 mmHg.   3. Moderate pleural effusion in both left and right lateral regions.   4. The mitral valve is abnormal. Trivial mitral valve regurgitation.   5. The aortic valve  is tricuspid. Aortic valve regurgitation is trivial.  Aortic valve sclerosis/calcification is present, without any evidence of  aortic stenosis.   6. The inferior vena cava is normal in size with greater than 50%  respiratory variability, suggesting right atrial pressure of 3 mmHg.    Echocardiogram 11/07/2022 1. Left ventricular ejection fraction, by estimation, is 60 to 65%. The  left ventricle has normal function. The left ventricle has no regional  wall motion abnormalities. Left ventricular diastolic parameters are  consistent with Grade II diastolic  dysfunction (pseudonormalization).   2. Right ventricular systolic function is normal. The right ventricular  size is normal. There is normal pulmonary artery systolic pressure.   3. The mitral valve is normal in  structure. No evidence of mitral valve  regurgitation. No evidence of mitral stenosis.   4. The aortic valve is tricuspid. Aortic valve regurgitation is not  visualized. No aortic stenosis is present.   5. The inferior vena cava is normal in size with greater than 50%  respiratory variability, suggesting right atrial pressure of 3 mmHg.   Risk Assessment/Calculations:    CHA2DS2-VASc Score = 8   This indicates a 10.8% annual risk of stroke. The patient's score is based upon: CHF History: 1 HTN History: 1 Diabetes History: 1 Stroke History: 2 Vascular Disease History: 1 Age Score: 2 Gender Score: 0             Physical Exam:   VS:  BP 138/80   Pulse 74   Ht 5' 10 (1.778 m)   Wt 139 lb (63 kg)   BMI 19.94 kg/m    Wt Readings from Last 3 Encounters:  01/17/24 139 lb (63 kg)  12/14/23 132 lb (59.9 kg)  12/04/23 137 lb (62.1 kg)    GEN: Well nourished, well developed in no acute distress NECK: No JVD; No carotid bruits CARDIAC: RRR, no murmurs, rubs, gallops RESPIRATORY:  Clear to auscultation without rales, wheezing or rhonchi  ABDOMEN: Soft, non-tender, non-distended EXTREMITIES:  No edema; No acute  deformity      Assessment and Plan:  HFimpEF Cardiac MRI 06/2023 with LVEF 42% and RVEF 48% with severe aortic regurgitation associated with LV dysfunction CHF in the setting of severe AI now s/p AVR on 11/08/2023 Most recent echo 12/2023 with LVEF 60 to 65%, no RWMA - Remains stable with NYHA class II symptoms (mild dyspnea) - Today he appears euvolemic and well compensated on exam without significant dyspnea and no orthopnea, PND, or LEE - GDMT limited in the past due to CKD and hypotension  - Continue Jardiance  25 mg daily and metoprolol  tartrate 50 mg twice daily - Continue furosemide  40 mg daily as needed   Severe aortic regurgitation s/p AVR Cardiac MRI 06/2023 showed moderate to severe regurgitation with regurgitant fraction of 35% with moderate left ventricular dilation consistent with aortic regurgitation associated left ventricular dysfunction S/p aortic valve replacement with a 21 mm Edwards Inspiris bioprosthetic valve on 10/8 Most recent echocardiogram 12/2023 shows normal function of aortic valve prosthesis - Today he is stable without signs of cardiac decompensation - Continue furosemide  40 mg daily as needed - SBE prophylaxis   CKD stage IIIb Creatinine 2.45 and GFR 26 on 01/01/2024 Avoid NSAIDs and stay adequately hydrated - Has established with [facility] who recommended continuing SGLT2i inhibitor and using furosemide  as needed - Pending kidney ultrasound   Hypertension Blood pressure today is 138/80 and well-controlled - Experienced hypotension post AVR.  Amlodipine , isosorbide , bisoprolol , and hydralazine  were subsequently discontinued - Continue metoprolol  tartrate 50 mg twice daily   Paroxysmal atrial fibrillation Postoperative atrial fibrillation S/p left atrial appendage ligation on 11/08/2023 First episode noted during admission with sepsis/MSSA bacteremia on 11/2022 and spontaneously converted to sinus rhythm.  Was noted not to be a candidate for  anticoagulation due to recurrent falls at the time as he was also noted to have history of GI bleed due to duodenal ulcer in 2021 Experienced an episode of atrial fibrillation with RVR postop AVR and left atrial appendage ligation on 10/8.  Now managed on amiodarone  - He has been deemed not a candidate for anticoagulation in the past due to his frailty, muscle weakness, and high risk of falls - He denies  any symptoms concerning for recurrent atrial fibrillation - EKG today shows he is maintaining sinus rhythm - Given he has maintained sinus rhythm since AVR I will discontinue his amiodarone  today - Continue aspirin  81 mg daily   Coronary artery disease Cardiac catheterization 11/2023 showed patent coronary arteries with mild diffuse calcific plaquing but no significant stenosis - Today patient is stable without chest pains.  He denies any anginal symptoms.  No indication further ischemic evaluation at this time - Continue aspirin  81 mg daily and rosuvastatin  40 mg daily   Hyperlipidemia LDL 36 on 08/2023 and well-controlled - Continue rosuvastatin  40 mg   T2DM A1c 7.7% on 11/2023 Managed on glipizide  and Trulicity  - Management per PCP   Iron deficiency anemia Hemoglobin 12.3 on 12/2023 - Stable  History of CVA - No new neurological symptoms - Continue aspirin  81 mg daily     Cardiac Rehabilitation Eligibility Assessment  The patient is ready to start cardiac rehabilitation pending clearance from the cardiac surgeon.     Dispo:  Return in about 3 months (around 04/16/2024).  Signed, Lum LITTIE Louis, NP

## 2024-01-17 NOTE — Telephone Encounter (Signed)
 Rescheduled patients missed appointments. Called and left a voicemail with the details, also sending an appointment reminder letter.

## 2024-01-18 ENCOUNTER — Other Ambulatory Visit: Payer: Self-pay | Admitting: Internal Medicine

## 2024-01-18 ENCOUNTER — Ambulatory Visit: Admitting: Internal Medicine

## 2024-01-18 ENCOUNTER — Encounter: Payer: Self-pay | Admitting: Internal Medicine

## 2024-01-18 ENCOUNTER — Ambulatory Visit: Payer: Self-pay | Admitting: Internal Medicine

## 2024-01-18 VITALS — BP 160/62 | HR 88 | Temp 98.0°F | Ht 70.0 in | Wt 142.6 lb

## 2024-01-18 DIAGNOSIS — I159 Secondary hypertension, unspecified: Secondary | ICD-10-CM | POA: Diagnosis not present

## 2024-01-18 DIAGNOSIS — M542 Cervicalgia: Secondary | ICD-10-CM

## 2024-01-18 DIAGNOSIS — N179 Acute kidney failure, unspecified: Secondary | ICD-10-CM | POA: Diagnosis not present

## 2024-01-18 DIAGNOSIS — M5441 Lumbago with sciatica, right side: Secondary | ICD-10-CM | POA: Diagnosis not present

## 2024-01-18 DIAGNOSIS — M5442 Lumbago with sciatica, left side: Secondary | ICD-10-CM | POA: Diagnosis not present

## 2024-01-18 DIAGNOSIS — Z79899 Other long term (current) drug therapy: Secondary | ICD-10-CM | POA: Diagnosis not present

## 2024-01-18 DIAGNOSIS — N189 Chronic kidney disease, unspecified: Secondary | ICD-10-CM | POA: Diagnosis not present

## 2024-01-18 DIAGNOSIS — G8929 Other chronic pain: Secondary | ICD-10-CM

## 2024-01-18 DIAGNOSIS — G4700 Insomnia, unspecified: Secondary | ICD-10-CM | POA: Diagnosis not present

## 2024-01-18 LAB — BASIC METABOLIC PANEL WITH GFR
BUN: 21 mg/dL (ref 6–23)
CO2: 25 meq/L (ref 19–32)
Calcium: 8.2 mg/dL — ABNORMAL LOW (ref 8.4–10.5)
Chloride: 107 meq/L (ref 96–112)
Creatinine, Ser: 1.51 mg/dL — ABNORMAL HIGH (ref 0.40–1.50)
GFR: 43.77 mL/min — ABNORMAL LOW (ref >60.00)
Glucose, Bld: 205 mg/dL — ABNORMAL HIGH (ref 70–99)
Potassium: 3.7 meq/L (ref 3.5–5.1)
Sodium: 140 meq/L (ref 135–145)

## 2024-01-18 MED ORDER — DOXEPIN HCL 6 MG PO TABS: 1.0000 | ORAL_TABLET | Freq: Every evening | ORAL | 1 refills | Status: AC

## 2024-01-18 MED ORDER — OXYCODONE HCL 10 MG PO TABS: 10.0000 mg | ORAL_TABLET | Freq: Three times a day (TID) | ORAL | 0 refills | Status: AC | PRN

## 2024-01-18 MED ORDER — RAMELTEON 8 MG PO TABS: 8.0000 mg | ORAL_TABLET | Freq: Every day | ORAL | 3 refills | Status: AC

## 2024-01-18 MED ORDER — DAYVIGO 5 MG PO TABS: 1.0000 | ORAL_TABLET | Freq: Every day | ORAL | 1 refills | Status: AC

## 2024-01-18 NOTE — Assessment & Plan Note (Signed)
 Most critical concern today by far.  Severe.  Chronic insomnia is worsened by recent hospitalization and possible medication buildup from acute kidney injury. Previous treatments with trazodone , melatonin, and CBD gummies were ineffective.  Reports no sleep for days. He is at high risk with sedatives due to concurrent opioid use, age, and kidney function. Prescribed Dayvigo  (lemborexant ) for sleep, pending insurance approval. He should trial one pill to assess effectiveness and maintain a two-hour separation between Dayvigo  and oxycodone  to avoid respiratory depression. Increased fluid intake is encouraged to aid kidney function and medication clearance.  He will stop risperidone, amitriptyline ,  glipizide , and half his dose on keppra .

## 2024-01-18 NOTE — Assessment & Plan Note (Signed)
 Acute kidney injury is likely secondary to contrast dye from recent valve replacement surgery, compromising kidney function and leading to medication buildup and potential toxicity. Discontinued or reduced doses of medications that may accumulate, including ropinirole , amitriptyline , and Keppra . Increased fluid intake is encouraged to promote kidney function and medication clearance. Labs are ordered to monitor kidney function and medication levels. Lab Results  Component Value Date   GFR 43.77 (L) 01/18/2024   GFR 67.73 05/03/2022   GFR 78.99 12/14/2020   GFR 69.77 02/06/2019   GFR 79.23 10/13/2015   GFR 104.35 09/08/2015   GFR 91.16 04/09/2014   Lab Results  Component Value Date   EGFR 26.0 01/01/2024   EGFR 22 (L) 12/26/2023   EGFR 23 (L) 12/14/2023   EGFR 39 (L) 12/04/2023   EGFR 27 (L) 10/10/2023   EGFR 30 (L) 08/30/2023   EGFR 32 (L) 08/29/2023   EGFR 30 (L) 08/14/2023   EGFR 37 (L) 07/20/2023   EGFR 44 (L) 06/07/2023   EGFR 39 (L) 02/22/2023   EGFR 40 (L) 01/17/2023   EGFR 38 (L) 12/28/2022

## 2024-01-18 NOTE — Progress Notes (Signed)
 ==============================  [facility]: [phone]   -- Medical Office Visit --  Patient: Michael Bender      Age: 78 y.o.       Sex:  male  Date:   01/18/2024 Today's Healthcare Provider: Bernardino KANDICE Cone, MD  ==============================   Chief Complaint: Insomnia Severe, related with hospital visit with contrast related-acute kidney injury associated with valve replacement (which helps breathing)  Discussed the use of AI scribe software for clinical note transcription with the patient, who gave verbal consent to proceed. History of Present Illness 78 year old male with stage three to four kidney disease who presents with severe insomnia following a recent valve replacement surgery.  He has been experiencing severe insomnia since his recent hospitalization for a valve replacement surgery. He has only slept once since being discharged, attributing his disrupted sleep schedule to frequent interruptions during his hospital stay. He describes his current sleep pattern as non-existent and does not attempt to lay down anymore.  He has a history of kidney function decline following the valve replacement surgery, which led to a consultation with a nephrologist. The nephrologist made some medication changes, suspecting that toxin buildup due to impaired kidney function might be contributing to his sleep issues.  He has tried various treatments for his insomnia, including trazodone , melatonin, and CBD gummies, but these have not been effective. He is currently on high strength opioids for severe back problems, which complicates the management of his insomnia due to potential interactions with sedatives.  His medication list includes opioids for back pain and Keppra , which was prescribed following a seizure post-surgery. He has been taken off Lasix  and another medication for atrial fibrillation, which he cannot recall. He is unsure about his use of  ropinirole , a medication for restless legs, and amitriptyline , which may also be contributing to his sleep issues.  He has not yet established care with a pain specialist despite attempts to do so. He is frustrated with the high cost of potential new medications for sleep and the lack of insurance coverage for them.  Lab Results  Component Value Date   GFR 43.77 (L) 01/18/2024   GFR 67.73 05/03/2022   GFR 78.99 12/14/2020   GFR 69.77 02/06/2019   GFR 79.23 10/13/2015   GFR 104.35 09/08/2015   GFR 91.16 04/09/2014   Lab Results  Component Value Date   EGFR 26.0 01/01/2024   EGFR 22 (L) 12/26/2023   EGFR 23 (L) 12/14/2023   EGFR 39 (L) 12/04/2023   EGFR 27 (L) 10/10/2023   EGFR 30 (L) 08/30/2023   EGFR 32 (L) 08/29/2023   EGFR 30 (L) 08/14/2023   EGFR 37 (L) 07/20/2023   EGFR 44 (L) 06/07/2023   EGFR 39 (L) 02/22/2023   EGFR 40 (L) 01/17/2023   EGFR 38 (L) 12/28/2022   Background Reviewed: Problem List: has GERD; CONSTIPATION, CHRONIC; Primary hypertension; Bilateral leg pain; Esophageal dysphagia; Insomnia; Peripheral arterial disease; History of CVA (cerebrovascular accident); Anemia; HLD (hyperlipidemia); COPD (chronic obstructive pulmonary disease) (HCC); Iron deficiency anemia; Duodenal stenosis; Opioid dependence (HCC); Atherosclerosis of aorta; High risk medication use; Gynecomastia, male; Chronic bilateral low back pain with bilateral sciatica; Acute kidney injury superimposed on chronic kidney disease; Neck pain; Memory loss; Frequent falls; Nonintractable headache; Balance problems; Chronic heart failure with preserved ejection fraction (HFpEF, >= 50%) (HCC); Orthostatic hypotension; Chronic kidney disease (CKD), active medical management without dialysis, stage 3 (moderate) (HCC); Elevated BUN; Severe aortic regurgitation; Postlaminectomy syndrome, not  elsewhere classified; Fecal incontinence alternating with constipation; Sleep disturbance; Diabetes mellitus, new onset (HCC);  Chronic kidney disease, stage 4 (severe) (HCC); Restless legs; Hyperkalemia; SOB (shortness of breath); Protein-calorie malnutrition, severe; History of aortic valve replacement with porcine valve; Weakness; Type 2 diabetes mellitus with hyperglycemia, without long-term current use of insulin  (HCC); Frailty; and Medication management on their problem list. Past Medical History:  has a past medical history of Acute combined systolic and diastolic heart failure (HCC) (96/77/7974), Acute on chronic diastolic CHF (congestive heart failure) (HCC) (04/20/2023), AKI (acute kidney injury) (10/28/2022), Arthritis, Balance problems (02/21/2023), Blood transfusion, Cachexia (05/03/2022), Cervical discitis (11/10/2022), Chronic back pain, COPD (chronic obstructive pulmonary disease) (HCC), Disturbance of skin sensation (05/22/2018), Effusion of right knee (02/21/2023), Effusion of right knee joint (01/16/2023), Elevated troponin (04/22/2023), Essential hypertension (03/05/2007), Finger pain, left (11/08/2022), Gastric erosion, Gastritis and gastroduodenitis, Gastrointestinal hemorrhage with melena (11/20/2018), GIB (gastrointestinal bleeding) (07/18/2019), Hardware complicating wound infection (11/10/2022), Headache(784.0), Hemorrhoids, internal, High risk medication use (03/03/2022), History of duodenal ulcer, History of fusion of cervical spine (03/03/2022), History of lumbar fusion (03/03/2022), History of upper gastrointestinal bleeding (03/03/2022), HLD (hyperlipidemia), Hypertension, Hypokalemia (04/22/2023), Hypomagnesemia (04/22/2023), Infected blister of left index finger (11/07/2022), Intractable pain (03/03/2022), Loss of weight, MSSA bacteremia (11/07/2022), Neck rigidity, Nocturia, PAIN, CHRONIC NEC (10/06/2006), Pneumonia, Prostate disease, Right sided temporal headache (05/22/2018), S/P cervical spinal fusion (03/03/2022), Senile ecchymosis (01/21/2019), Septic arthritis of wrist, left (HCC) (11/08/2022), Septic  infrapatellar bursitis of right knee (11/07/2022), Staphylococcal arthritis of left wrist (HCC) (11/07/2022), Staphylococcal arthritis of right knee (HCC) (11/08/2022), Syncope (11/05/2022), and Underweight on examination (05/03/2022). Past Surgical History:   has a past surgical history that includes Cervical fusion (1992); Spinal fusion (05/06/11); Colonoscopy; Esophagogastroduodenoscopy (07/20/2011); Savory dilation (07/20/2011); biopsy (07/19/2019); Esophagogastroduodenoscopy (egd) with propofol  (N/A, 07/19/2019); I & D extremity (Left, 11/09/2022); TRANSESOPHAGEAL ECHOCARDIOGRAM (N/A, 07/24/2023); RIGHT/LEFT HEART CATH AND CORONARY ANGIOGRAPHY (N/A, 11/06/2023); Aortic valve replacement (N/A, 11/08/2023); Clipping of atrial appendage (N/A, 11/08/2023); and Intraoprative transesophageal echocardiogram (N/A, 11/08/2023). Social History:   reports that he quit smoking about 5 years ago. His smoking use included cigarettes. He started smoking about 45 years ago. He has never used smokeless tobacco. He reports that he does not currently use alcohol  after a past usage of about 2.0 standard drinks of alcohol  per week. He reports that he does not use drugs. Family History:  family history includes Heart attack (age of onset: 83) in his mother; Heart disease in his mother; Pancreatic cancer in his brother; Pancreatic cancer (age of onset: 58) in his father. Allergies:  is allergic to nubain [nalbuphine hcl].   Medication Reconciliation: Current Outpatient Medications on File Prior to Visit  Medication Sig   ACCU-CHEK GUIDE TEST test strip 1 each by Other route 4 (four) times daily.   albuterol  (VENTOLIN  HFA) 108 (90 Base) MCG/ACT inhaler Inhale 2 puffs into the lungs every 6 (six) hours as needed for wheezing or shortness of breath.   aspirin  EC 81 MG tablet Take 1 tablet (81 mg total) by mouth daily. Swallow whole.   B Complex-C (B-COMPLEX WITH VITAMIN C) tablet Take 1 tablet by mouth daily.   bisacodyl  5 MG EC tablet  Take 1 tablet (5 mg total) by mouth daily as needed for moderate constipation.   Blood Glucose Monitoring Suppl (ACCU-CHEK GUIDE ME) w/Device KIT Inject 1 Application into the skin 4 (four) times daily.   Blood Glucose Monitoring Suppl DEVI 1 each by Does not apply route in the morning, at noon, and at bedtime.  May substitute to any manufacturer covered by patient's insurance.   Continuous Glucose Sensor (FREESTYLE LIBRE 3 PLUS SENSOR) MISC Change sensor every 15 days.   Dulaglutide  (TRULICITY ) 0.75 MG/0.5ML SOAJ Inject 0.75 mg into the skin once a week. Replaces ozempic    empagliflozin  (JARDIANCE ) 25 MG TABS tablet Take 1 tablet (25 mg total) by mouth daily.   [Paused] furosemide  (LASIX ) 40 MG tablet Take 1 tablet (40 mg total) by mouth daily. (Patient taking differently: Take 40 mg by mouth daily. TAKE 40 MG (1 TABLET) AS NEEDED FOR WEIGHT GAIN OF 3 POUNDS IN ONE NIGHT OR 5 POUNDS IN 1 WEEK.)   ipratropium-albuterol  (DUONEB) 0.5-2.5 (3) MG/3ML SOLN Take 3 mLs by nebulization every 4 (four) hours as needed.   levETIRAcetam  (KEPPRA ) 500 MG tablet Take 1 tablet (500 mg total) by mouth 2 (two) times daily.   magnesium  gluconate (MAGONATE) 500 (27 Mg) MG TABS tablet Take 0.5 tablets (250 mg total) by mouth at bedtime.   metoprolol  tartrate (LOPRESSOR ) 50 MG tablet Take 1 tablet (50 mg total) by mouth 2 (two) times daily.   Multiple Vitamin (MULTIVITAMIN WITH MINERALS) TABS tablet Take 1 tablet by mouth daily.   pantoprazole  (PROTONIX ) 40 MG tablet Take 1 tablet (40 mg total) by mouth daily.   polyethylene glycol (MIRALAX  / GLYCOLAX ) 17 g packet Take 17 g by mouth daily as needed for moderate constipation.   Respiratory Therapy Supplies (NEBULIZER/TUBING/MOUTHPIECE) KIT 1 each by Does not apply route every 4 (four) hours as needed.   rosuvastatin  (CRESTOR ) 40 MG tablet TAKE 1 TABLET BY MOUTH DAILY. REPLACES ATORVASTATIN  (STOP ATORVASTATIN  IF STILL TAKING)   SUMAtriptan  (IMITREX ) 50 MG tablet TAKE 1  TABLET (50 MG TOTAL) BY MOUTH DAILY. MAY REPEAT IN 2 HOURS IF HEADACHE PERSISTS OR RECURS.   traZODone  (DESYREL ) 100 MG tablet Take 1 tablet (100 mg total) by mouth at bedtime.   vitamin D3 (CHOLECALCIFEROL ) 25 MCG tablet Take 4 tablets (4,000 Units total) by mouth daily.   No current facility-administered medications on file prior to visit.   Medications Discontinued During This Encounter  Medication Reason   amitriptyline  (ELAVIL ) 50 MG tablet Dose change   Oxycodone  HCl 10 MG TABS Reorder   rOPINIRole  (REQUIP ) 0.25 MG tablet Side effect (s)   glipiZIDE  (GLUCOTROL ) 5 MG tablet Side effect (s)     Physical Exam:    01/18/2024    8:25 AM 01/18/2024    8:09 AM 01/17/2024   10:03 AM  Vitals with BMI  Height  5' 10   Weight  142 lbs 10 oz   BMI  20.46   Systolic 160 162 861  Diastolic 62 62 80  Pulse  88   Vital signs reviewed.  Nursing notes reviewed. Weight trend reviewed. Physical Activity: Insufficiently Active (10/13/2022)   Exercise Vital Sign    Days of Exercise per Week: 3 days    Minutes of Exercise per Session: 30 min   General Appearance:  No acute distress appreciable.   Well-groomed, healthy-appearing male.  Well proportioned with no abnormal fat distribution.  Good muscle tone. Pulmonary:  Normal work of breathing at rest, no respiratory distress apparent. SpO2: 98 %  Musculoskeletal: All extremities are intact.  Neurological:  Awake, alert, oriented, and engaged.  No obvious focal neurological deficits or cognitive impairments.  Sensorium seems unclouded.   Speech is clear and coherent with logical content. Psychiatric:  Appropriate mood, pleasant and cooperative demeanor, thoughtful and engaged during the exam   Verbalized to patient:  Physical Exam    Results:   Verbalized to patient: Results Diagnostic Echocardiogram: Normal cardiac function     04/19/2023   10:47 AM 03/22/2023    9:40 AM 02/17/2023   11:42 AM 01/17/2023   10:13 AM  PHQ 2/9 Scores   PHQ - 2 Score 0 0 2 0  PHQ- 9 Score  0  3       Data saved with a previous flowsheet row definition   Office Visit on 01/18/2024  Component Date Value Ref Range Status   Sodium 01/18/2024 140  135 - 145 mEq/L Final   Potassium 01/18/2024 3.7  3.5 - 5.1 mEq/L Final   Chloride 01/18/2024 107  96 - 112 mEq/L Final   CO2 01/18/2024 25  19 - 32 mEq/L Final   Glucose, Bld 01/18/2024 205 (H)  70 - 99 mg/dL Final   BUN 87/81/7974 21  6 - 23 mg/dL Final   Creatinine, Ser 01/18/2024 1.51 (H)  0.40 - 1.50 mg/dL Final   GFR 87/81/7974 43.77 (L)  >60.00 mL/min Final   Calcium  01/18/2024 8.2 (L)  8.4 - 10.5 mg/dL Final  Hospital Outpatient Visit on 12/26/2023  Component Date Value Ref Range Status   Area-P 1/2 12/26/2023 2.44  cm2 Final   S' Lateral 12/26/2023 2.95  cm Final   AV Area mean vel 12/26/2023 1.92  cm2 Final   AR max vel 12/26/2023 1.93  cm2 Final   AV Area VTI 12/26/2023 2.16  cm2 Final   Ao pk vel 12/26/2023 2.00  m/s Final   AV Mean grad 12/26/2023 8.3  mmHg Final   AV Peak grad 12/26/2023 16.0  mmHg Final   Radius 12/26/2023 0.47  cm Final   MV M vel 12/26/2023 3.84  m/s Final   MV Peak grad 12/26/2023 59.0  mmHg Final   Est EF 12/26/2023 60 - 65%   Final  Results Follow-Up on 12/25/2023  Component Date Value Ref Range Status   Glucose 12/26/2023 175 (H)  70 - 99 mg/dL Final   BUN 88/74/7974 49 (H)  8 - 27 mg/dL Final   Creatinine, Ser 12/26/2023 2.83 (H)  0.76 - 1.27 mg/dL Final   eGFR 88/74/7974 22 (L)  >59 mL/min/1.73 Final   BUN/Creatinine Ratio 12/26/2023 17  10 - 24 Final   Sodium 12/26/2023 137  134 - 144 mmol/L Final   Potassium 12/26/2023 4.0  3.5 - 5.2 mmol/L Final   Chloride 12/26/2023 101  96 - 106 mmol/L Final   CO2 12/26/2023 23  20 - 29 mmol/L Final   Calcium  12/26/2023 8.4 (L)  8.6 - 10.2 mg/dL Final  Admission on 88/85/7974, Discharged on 12/15/2023  Component Date Value Ref Range Status   Sodium 12/15/2023 136  135 - 145 mmol/L Final   Potassium  12/15/2023 4.9  3.5 - 5.1 mmol/L Final   Chloride 12/15/2023 99  98 - 111 mmol/L Final   CO2 12/15/2023 23  22 - 32 mmol/L Final   Glucose, Bld 12/15/2023 158 (H)  70 - 99 mg/dL Final   BUN 88/85/7974 59 (H)  8 - 23 mg/dL Final   Creatinine, Ser 12/15/2023 2.65 (H)  0.61 - 1.24 mg/dL Final   Calcium  12/15/2023 9.1  8.9 - 10.3 mg/dL Final   GFR, Estimated 12/15/2023 24 (L)  >60 mL/min Final   Anion gap 12/15/2023 14  5 - 15 Final   WBC 12/15/2023 8.9  4.0 - 10.5 K/uL Final   RBC 12/15/2023 4.10 (L)  4.22 -  5.81 MIL/uL Final   Hemoglobin 12/15/2023 12.3 (L)  13.0 - 17.0 g/dL Final   HCT 88/85/7974 37.3 (L)  39.0 - 52.0 % Final   MCV 12/15/2023 91.0  80.0 - 100.0 fL Final   MCH 12/15/2023 30.0  26.0 - 34.0 pg Final   MCHC 12/15/2023 33.0  30.0 - 36.0 g/dL Final   RDW 88/85/7974 16.5 (H)  11.5 - 15.5 % Final   Platelets 12/15/2023 166  150 - 400 K/uL Final   nRBC 12/15/2023 0.0  0.0 - 0.2 % Final   Neutrophils Relative % 12/15/2023 75  % Final   Neutro Abs 12/15/2023 6.6  1.7 - 7.7 K/uL Final   Lymphocytes Relative 12/15/2023 16  % Final   Lymphs Abs 12/15/2023 1.4  0.7 - 4.0 K/uL Final   Monocytes Relative 12/15/2023 9  % Final   Monocytes Absolute 12/15/2023 0.8  0.1 - 1.0 K/uL Final   Eosinophils Relative 12/15/2023 0  % Final   Eosinophils Absolute 12/15/2023 0.0  0.0 - 0.5 K/uL Final   Basophils Relative 12/15/2023 0  % Final   Basophils Absolute 12/15/2023 0.0  0.0 - 0.1 K/uL Final   Immature Granulocytes 12/15/2023 0  % Final   Abs Immature Granulocytes 12/15/2023 0.03  0.00 - 0.07 K/uL Final  Orders Only on 12/11/2023  Component Date Value Ref Range Status   Glucose 12/14/2023 204 (H)  70 - 99 mg/dL Final   BUN 88/86/7974 66 (H)  8 - 27 mg/dL Final   Creatinine, Ser 12/14/2023 2.71 (H)  0.76 - 1.27 mg/dL Final   eGFR 88/86/7974 23 (L)  >59 mL/min/1.73 Final   BUN/Creatinine Ratio 12/14/2023 24  10 - 24 Final   Sodium 12/14/2023 142  134 - 144 mmol/L Final   Potassium  12/14/2023 5.4 (H)  3.5 - 5.2 mmol/L Final   Chloride 12/14/2023 100  96 - 106 mmol/L Final   CO2 12/14/2023 21  20 - 29 mmol/L Final   Calcium  12/14/2023 9.1  8.6 - 10.2 mg/dL Final  Office Visit on 12/04/2023  Component Date Value Ref Range Status   NT-Pro BNP 12/04/2023 4,915 (H)  0 - 486 pg/mL Final   Glucose 12/04/2023 250 (H)  70 - 99 mg/dL Final   BUN 88/96/7974 41 (H)  8 - 27 mg/dL Final   Creatinine, Ser 12/04/2023 1.76 (H)  0.76 - 1.27 mg/dL Final   eGFR 88/96/7974 39 (L)  >59 mL/min/1.73 Final   BUN/Creatinine Ratio 12/04/2023 23  10 - 24 Final   Sodium 12/04/2023 140  134 - 144 mmol/L Final   Potassium 12/04/2023 5.0  3.5 - 5.2 mmol/L Final   Chloride 12/04/2023 104  96 - 106 mmol/L Final   CO2 12/04/2023 21  20 - 29 mmol/L Final   Calcium  12/04/2023 8.9  8.6 - 10.2 mg/dL Final   WBC 88/96/7974 9.2  3.4 - 10.8 x10E3/uL Final   RBC 12/04/2023 3.83 (L)  4.14 - 5.80 x10E6/uL Final   Hemoglobin 12/04/2023 11.6 (L)  13.0 - 17.7 g/dL Final   Hematocrit 88/96/7974 36.7 (L)  37.5 - 51.0 % Final   MCV 12/04/2023 96  79 - 97 fL Final   MCH 12/04/2023 30.3  26.6 - 33.0 pg Final   MCHC 12/04/2023 31.6  31.5 - 35.7 g/dL Final   RDW 88/96/7974 16.2 (H)  11.6 - 15.4 % Final   Platelets 12/04/2023 217  150 - 450 x10E3/uL Final   PTH 01/01/2024 45   Final  EGFR 01/01/2024 26.0   Final   Calcium  01/01/2024 8.1   Final  Admission on 11/15/2023, Discharged on 11/21/2023  Component Date Value Ref Range Status   Sodium 11/16/2023 139  135 - 145 mmol/L Final   Potassium 11/16/2023 3.5  3.5 - 5.1 mmol/L Final   Chloride 11/16/2023 105  98 - 111 mmol/L Final   CO2 11/16/2023 22  22 - 32 mmol/L Final   Glucose, Bld 11/16/2023 158 (H)  70 - 99 mg/dL Final   BUN 89/83/7974 31 (H)  8 - 23 mg/dL Final   Creatinine, Ser 11/16/2023 1.58 (H)  0.61 - 1.24 mg/dL Final   Calcium  11/16/2023 8.2 (L)  8.9 - 10.3 mg/dL Final   Total Protein 89/83/7974 6.3 (L)  6.5 - 8.1 g/dL Final   Albumin  11/16/2023  2.8 (L)  3.5 - 5.0 g/dL Final   AST 89/83/7974 101 (H)  15 - 41 U/L Final   ALT 11/16/2023 34  0 - 44 U/L Final   Alkaline Phosphatase 11/16/2023 98  38 - 126 U/L Final   Total Bilirubin 11/16/2023 0.8  0.0 - 1.2 mg/dL Final   GFR, Estimated 11/16/2023 44 (L)  >60 mL/min Final   Anion gap 11/16/2023 12  5 - 15 Final   WBC 11/16/2023 6.5  4.0 - 10.5 K/uL Final   RBC 11/16/2023 4.00 (L)  4.22 - 5.81 MIL/uL Final   Hemoglobin 11/16/2023 11.6 (L)  13.0 - 17.0 g/dL Final   HCT 89/83/7974 36.1 (L)  39.0 - 52.0 % Final   MCV 11/16/2023 90.3  80.0 - 100.0 fL Final   MCH 11/16/2023 29.0  26.0 - 34.0 pg Final   MCHC 11/16/2023 32.1  30.0 - 36.0 g/dL Final   RDW 89/83/7974 16.7 (H)  11.5 - 15.5 % Final   Platelets 11/16/2023 164  150 - 400 K/uL Final   nRBC 11/16/2023 0.0  0.0 - 0.2 % Final   Neutrophils Relative % 11/16/2023 75  % Final   Neutro Abs 11/16/2023 4.8  1.7 - 7.7 K/uL Final   Lymphocytes Relative 11/16/2023 16  % Final   Lymphs Abs 11/16/2023 1.0  0.7 - 4.0 K/uL Final   Monocytes Relative 11/16/2023 8  % Final   Monocytes Absolute 11/16/2023 0.5  0.1 - 1.0 K/uL Final   Eosinophils Relative 11/16/2023 0  % Final   Eosinophils Absolute 11/16/2023 0.0  0.0 - 0.5 K/uL Final   Basophils Relative 11/16/2023 0  % Final   Basophils Absolute 11/16/2023 0.0  0.0 - 0.1 K/uL Final   Immature Granulocytes 11/16/2023 1  % Final   Abs Immature Granulocytes 11/16/2023 0.06  0.00 - 0.07 K/uL Final   Glucose-Capillary 11/15/2023 180 (H)  70 - 99 mg/dL Final   Glucose-Capillary 11/15/2023 260 (H)  70 - 99 mg/dL Final   Comment 1 89/84/7974 Notify RN   Final   Glucose-Capillary 11/16/2023 154 (H)  70 - 99 mg/dL Final   Comment 1 89/83/7974 Notify RN   Final   Vit D, 25-Hydroxy 11/16/2023 16.82 (L)  30 - 100 ng/mL Final   Magnesium  11/16/2023 2.1  1.7 - 2.4 mg/dL Final   Glucose-Capillary 11/16/2023 178 (H)  70 - 99 mg/dL Final   Glucose-Capillary 11/16/2023 227 (H)  70 - 99 mg/dL Final    Glucose-Capillary 11/16/2023 206 (H)  70 - 99 mg/dL Final   Glucose-Capillary 11/17/2023 128 (H)  70 - 99 mg/dL Final   Glucose-Capillary 11/17/2023 230 (H)  70 - 99 mg/dL  Final   Glucose-Capillary 11/17/2023 299 (H)  70 - 99 mg/dL Final   Glucose-Capillary 11/17/2023 208 (H)  70 - 99 mg/dL Final   Glucose-Capillary 11/18/2023 167 (H)  70 - 99 mg/dL Final   Glucose-Capillary 11/18/2023 201 (H)  70 - 99 mg/dL Final   Glucose-Capillary 11/18/2023 484 (H)  70 - 99 mg/dL Final   Glucose-Capillary 11/18/2023 420 (H)  70 - 99 mg/dL Final   Glucose-Capillary 11/18/2023 147 (H)  70 - 99 mg/dL Final   Glucose-Capillary 11/19/2023 199 (H)  70 - 99 mg/dL Final   Glucose-Capillary 11/19/2023 300 (H)  70 - 99 mg/dL Final   Glucose-Capillary 11/19/2023 267 (H)  70 - 99 mg/dL Final   Glucose-Capillary 11/19/2023 211 (H)  70 - 99 mg/dL Final   Sodium 89/79/7974 137  135 - 145 mmol/L Final   Potassium 11/20/2023 3.8  3.5 - 5.1 mmol/L Final   Chloride 11/20/2023 104  98 - 111 mmol/L Final   CO2 11/20/2023 23  22 - 32 mmol/L Final   Glucose, Bld 11/20/2023 170 (H)  70 - 99 mg/dL Final   BUN 89/79/7974 34 (H)  8 - 23 mg/dL Final   Creatinine, Ser 11/20/2023 1.80 (H)  0.61 - 1.24 mg/dL Final   Calcium  11/20/2023 8.3 (L)  8.9 - 10.3 mg/dL Final   GFR, Estimated 11/20/2023 38 (L)  >60 mL/min Final   Anion gap 11/20/2023 10  5 - 15 Final   WBC 11/20/2023 9.0  4.0 - 10.5 K/uL Final   RBC 11/20/2023 3.44 (L)  4.22 - 5.81 MIL/uL Final   Hemoglobin 11/20/2023 10.5 (L)  13.0 - 17.0 g/dL Final   HCT 89/79/7974 31.2 (L)  39.0 - 52.0 % Final   MCV 11/20/2023 90.7  80.0 - 100.0 fL Final   MCH 11/20/2023 30.5  26.0 - 34.0 pg Final   MCHC 11/20/2023 33.7  30.0 - 36.0 g/dL Final   RDW 89/79/7974 16.7 (H)  11.5 - 15.5 % Final   Platelets 11/20/2023 376  150 - 400 K/uL Final   nRBC 11/20/2023 0.0  0.0 - 0.2 % Final   Glucose-Capillary 11/19/2023 267 (H)  70 - 99 mg/dL Final   Glucose-Capillary 11/20/2023 161 (H)  70 -  99 mg/dL Final   Glucose-Capillary 11/20/2023 229 (H)  70 - 99 mg/dL Final   Glucose-Capillary 11/20/2023 167 (H)  70 - 99 mg/dL Final   Glucose-Capillary 11/20/2023 302 (H)  70 - 99 mg/dL Final   Glucose-Capillary 11/21/2023 208 (H)  70 - 99 mg/dL Final  No results displayed because visit has over 200 results.    Office Visit on 10/12/2023  Component Date Value Ref Range Status   Hgb A1c MFr Bld 10/12/2023 8.4 (H)  4.6 - 6.5 % Final   Islet Cell Ab 10/12/2023 Negative  Neg:<1:1 Final   Glutamic Acid Decarb Ab 10/12/2023 <5  <5 IU/mL Final   IA-2 Autoantibodies 10/12/2023 <7.5  U/mL Final   ZNT8 Antibodies 10/12/2023 <10  <15 U/mL Final  Office Visit on 10/10/2023  Component Date Value Ref Range Status   WBC 10/10/2023 7.2  3.4 - 10.8 x10E3/uL Final   RBC 10/10/2023 3.16 (L)  4.14 - 5.80 x10E6/uL Final   Hemoglobin 10/10/2023 9.7 (L)  13.0 - 17.7 g/dL Final   Hematocrit 90/90/7974 30.6 (L)  37.5 - 51.0 % Final   MCV 10/10/2023 97  79 - 97 fL Final   MCH 10/10/2023 30.7  26.6 - 33.0 pg Final   MCHC  10/10/2023 31.7  31.5 - 35.7 g/dL Final   RDW 90/90/7974 13.1  11.6 - 15.4 % Final   Platelets 10/10/2023 144 (L)  150 - 450 x10E3/uL Final   Glucose 10/10/2023 465 (H)  70 - 99 mg/dL Final   BUN 90/90/7974 38 (H)  8 - 27 mg/dL Final   Creatinine, Ser 10/10/2023 2.38 (H)  0.76 - 1.27 mg/dL Final   eGFR 90/90/7974 27 (L)  >59 mL/min/1.73 Final   BUN/Creatinine Ratio 10/10/2023 16  10 - 24 Final   Sodium 10/10/2023 132 (L)  134 - 144 mmol/L Final   Potassium 10/10/2023 5.4 (H)  3.5 - 5.2 mmol/L Final   Chloride 10/10/2023 100  96 - 106 mmol/L Final   CO2 10/10/2023 18 (L)  20 - 29 mmol/L Final   Calcium  10/10/2023 8.3 (L)  8.6 - 10.2 mg/dL Final  There may be more visits with results that are not included.  No image results found. ECHOCARDIOGRAM COMPLETE Result Date: 12/26/2023    ECHOCARDIOGRAM REPORT   Patient Name:   ARMOND CUTHRELL Date of Exam: 12/26/2023 Medical Rec #:  [id number]         Height:       71.0 in Accession #:    [id number]       Weight:       132.0 lb Date of Birth:  07-21-1945       BSA:          1.767 m Patient Age:    67 years         BP:           102/68 mmHg Patient Gender: M                HR:           52 bpm. Exam Location:  [address] Procedure: 2D Echo, 3D Echo, Cardiac Doppler and Color Doppler (Both Spectral            and Color Flow Doppler were utilized during procedure). Indications:    Z95.2 Status Post Aortic Valve Replacement  History:        Patient has prior history of Echocardiogram examinations, most                 recent 11/02/2023. CHF, CAD, COPD, Arrythmias:Atrial                 Fibrillation, Signs/Symptoms:Dizziness/Lightheadedness, Murmur,                 Syncope and Shortness of Breath; Risk Factors:Dyslipidemia,                 Hypertension, Family History of Coronary Artery Disease and                 Former Smoker. 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                 Aortic Valve: 27 mm Edwards Inspiris Resilia valve is present in                 the aortic position. Procedure Date: 11/08/23.  Sonographer:    Heather Hawks RDCS Referring Phys: Shatona Andujar G Shakea Isip IMPRESSIONS  1. Left ventricular ejection  fraction, by estimation, is 60 to 65%. The left ventricle has normal function. The left ventricle has no regional wall motion abnormalities. Left ventricular diastolic parameters are consistent with Grade I diastolic dysfunction (impaired relaxation).  2. Right ventricular systolic function is normal. The right ventricular size is normal.  3. The mitral valve is normal in structure. Trivial mitral valve regurgitation. No evidence of mitral stenosis.  4. The aortic valve has been repaired/replaced. Aortic valve regurgitation is trivial. No aortic stenosis is present. There is a 27 mm Edwards Inspiris Resilia valve  present in the aortic position. Procedure Date: 11/08/23.  5. Aortic dilatation noted. There is borderline dilatation of the ascending aorta, measuring 39 mm.  6. The inferior vena cava is normal in size with greater than 50% respiratory variability, suggesting right atrial pressure of 3 mmHg. FINDINGS  Left Ventricle: Left ventricular ejection fraction, by estimation, is 60 to 65%. The left ventricle has normal function. The left ventricle has no regional wall motion abnormalities. The left ventricular internal cavity size was normal in size. There is  no left ventricular hypertrophy. Left ventricular diastolic parameters are consistent with Grade I diastolic dysfunction (impaired relaxation). Right Ventricle: The right ventricular size is normal. Right ventricular systolic function is normal. Left Atrium: Left atrial size was normal in size. Right Atrium: Right atrial size was normal in size. Pericardium: There is no evidence of pericardial effusion. Mitral Valve: The mitral valve is normal in structure. Trivial mitral valve regurgitation. No evidence of mitral valve stenosis. Tricuspid Valve: The tricuspid valve is normal in structure. Tricuspid valve regurgitation is trivial. No evidence of tricuspid stenosis. Aortic Valve: The aortic valve has been repaired/replaced. Aortic valve regurgitation is trivial. No aortic stenosis is present. Aortic valve mean gradient measures 8.2 mmHg. Aortic valve peak gradient measures 16.0 mmHg. Aortic valve area, by VTI measures 2.16 cm. There is a 27 mm Edwards Inspiris Resilia valve present in the aortic position. Procedure Date: 11/08/23. Pulmonic Valve: The pulmonic valve was normal in structure. Pulmonic valve regurgitation is trivial. No evidence of pulmonic stenosis. Aorta: Aortic dilatation noted. There is borderline dilatation of the ascending aorta, measuring 39 mm. Venous: The inferior vena cava is normal in size with greater than 50% respiratory variability,  suggesting right atrial pressure of 3 mmHg. IAS/Shunts: No atrial level shunt detected by color flow Doppler. Additional Comments: 3D was performed not requiring image post processing on an independent workstation and was normal.  LEFT VENTRICLE PLAX 2D LVIDd:         4.20 cm   Diastology LVIDs:         2.95 cm   LV e' medial:    6.86 cm/s LV PW:         1.00 cm   LV E/e' medial:  10.1 LV IVS:        1.10 cm   LV e' lateral:   8.16 cm/s LVOT diam:     2.40 cm   LV E/e' lateral: 8.5 LV SV:         100 LV SV Index:   56 LVOT Area:     4.52 cm LV IVRT:       82 msec                          3D Volume EF:  3D EF:        68 %                          LV EDV:       148 ml                          LV ESV:       47 ml                          LV SV:        101 ml RIGHT VENTRICLE RV Basal diam:  4.00 cm     PULMONARY VEINS RV Mid diam:    3.10 cm     A Reversal Velocity: 26.10 cm/s RV S prime:     12.15 cm/s  Diastolic Velocity:  49.00 cm/s TAPSE (M-mode): 1.6 cm      S/D Velocity:        1.10 RVSP:           32.2 mmHg   Systolic Velocity:   52.25 cm/s LEFT ATRIUM             Index        RIGHT ATRIUM           Index LA diam:        4.30 cm 2.43 cm/m   RA Pressure: 3.00 mmHg LA Vol (A2C):   50.2 ml 28.41 ml/m  RA Area:     15.40 cm LA Vol (A4C):   53.5 ml 30.27 ml/m  RA Volume:   41.40 ml  23.43 ml/m LA Biplane Vol: 55.3 ml 31.29 ml/m  AORTIC VALVE AV Area (Vmax):    1.93 cm AV Area (Vmean):   1.92 cm AV Area (VTI):     2.16 cm AV Vmax:           200.25 cm/s AV Vmean:          130.500 cm/s AV VTI:            0.461 m AV Peak Grad:      16.0 mmHg AV Mean Grad:      8.2 mmHg LVOT Vmax:         85.65 cm/s LVOT Vmean:        55.400 cm/s LVOT VTI:          0.220 m LVOT/AV VTI ratio: 0.48  AORTA Ao Root diam: 3.45 cm Ao Asc diam:  3.85 cm MITRAL VALVE                  TRICUSPID VALVE MV Area (PHT): cm            TR Peak grad:   29.2 mmHg MV Decel Time: 312 msec       TR Vmax:        270.00 cm/s MR  Peak grad:    59.0 mmHg    Estimated RAP:  3.00 mmHg MR Mean grad:    45.0 mmHg    RVSP:           32.2 mmHg MR Vmax:         384.00 cm/s MR Vmean:        321.0 cm/s   SHUNTS MR PISA:         1.37 cm     Systemic VTI:  0.22 m MR PISA Eff ROA:  11 mm       Systemic Diam: 2.40 cm MR PISA Radius:  0.47 cm MV E velocity: 69.45 cm/s MV A velocity: 80.70 cm/s MV E/A ratio:  0.86 Redell Shallow MD Electronically signed by Redell Shallow MD Signature Date/Time: 12/26/2023/12:01:47 PM    Final    DG Chest 2 View Result Date: 12/14/2023 CLINICAL DATA:  Dyspnea.  AVR. EXAM: CHEST - 2 VIEW COMPARISON:  November 23, 2023. FINDINGS: The heart size and mediastinal contours are unchanged. Aortic valve replacement and left atrial appendage clip. Median sternotomy. Aortic atherosclerosis. No focal consolidation, pleural effusion, or pneumothorax. No acute osseous abnormality. IMPRESSION: 1. No acute cardiopulmonary findings. 2.  Aortic Atherosclerosis (ICD10-I70.0). Electronically Signed   By: Harrietta Sherry M.D.   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 No focal airspace consolidation, pleural effusion, or pneumothorax. No cardiomegaly. Sternotomy wires and aortic valve replacement with left atrial appendage clip. Tortuous aorta with aortic atherosclerosis. No acute fracture or destructive lesions. Multilevel thoracic osteophytosis. IMPRESSION: No acute cardiopulmonary abnormality. Electronically Signed   By: Rogelia Myers M.D.   On: 11/23/2023 13:17   DG Chest 2 View Result Date: 11/14/2023 CLINICAL DATA:  Status post cardiac surgery. EXAM: CHEST - 2 VIEW COMPARISON:  11/13/2023 FINDINGS: Lungs are hyperexpanded. No pneumothorax or substantial pleural effusion. No focal consolidation or evidence of pulmonary edema. The  cardiopericardial silhouette is within normal limits for size. Right PICC line tip overlies the mid to lower SVC level. Telemetry leads overlie the chest. IMPRESSION: Hyperexpansion without acute cardiopulmonary findings. Electronically Signed   By: Camellia Candle M.D.   On: 11/14/2023 05:44   DG CHEST PORT 1 VIEW Result Date: 11/13/2023 CLINICAL DATA:  Status post aortic valve replacement. EXAM: PORTABLE CHEST 1 VIEW COMPARISON:  Prior chest x-ray 11/11/2023 FINDINGS: The mediastinal drains have been removed. Patient is status post median sternotomy with evidence of left atrial appendage ligation and aortic valve replacement. Stable cardiac and mediastinal contours. Extensive atherosclerotic calcifications throughout the aorta. Right upper extremity PICC in good position with the tip overlying the mid SVC. The lungs are clear. No edema, pleural effusion or pneumothorax. No acute osseous abnormality. IMPRESSION: 1. Interval removal of mediastinal drains. 2. No acute cardiopulmonary process. 3. Well-positioned right upper extremity PICC with the catheter tip overlying the mid SVC. Electronically Signed   By: Wilkie Lent M.D.   On: 11/13/2023 09:41   DG CHEST PORT 1 VIEW Result Date: 11/11/2023 EXAM: 1 VIEW(S) XRAY OF THE CHEST 11/11/2023 01:29:00 PM COMPARISON: 11/10/2023 CLINICAL HISTORY: PICC (peripherally inserted central catheter) in place [id number]. PICC in place. FINDINGS: LINES, TUBES AND DEVICES: Right upper extremity PICC terminates in distal SVC. Mediastinal drains present. LUNGS AND PLEURA: No focal pulmonary opacity. No pulmonary edema. No pneumothorax identified. HEART AND MEDIASTINUM: Normal cardiac and mediastinal contours status post median sternotomy. Aortic valve replacement and left atrial appendage clip present. Aortic atherosclerosis noted. BONES AND SOFT TISSUES: Status post median sternotomy. IMPRESSION: 1. Right upper extremity PICC terminates in the distal  SVC. Electronically  signed by: Waddell Calk MD 11/11/2023 01:43 PM EDT RP Workstation: [id number]   US  EKG SITE RITE Result Date: 11/11/2023 If Site Rite image not attached, placement could not be confirmed due to current cardiac rhythm.  MR BRAIN WO CONTRAST Result Date: 11/10/2023 EXAM:  MRI BRAIN WITHOUT CONTRAST 11/10/2023 02:25:37 PM TECHNIQUE: Multiplanar multisequence MRI of the head/brain was performed without the administration of intravenous contrast. COMPARISON: Head CT and CTA 11/09/2023 and MRI 02/17/2023. CLINICAL HISTORY: New-onset seizure, no history of trauma. FINDINGS: BRAIN AND VENTRICLES: No acute infarct, mass, midline shift, hydrocephalus, or extra-axial fluid collection. There is mild to moderate cerebral atrophy. Patchy to confluent T2 hyperintensities in the cerebral white matter bilaterally are similar to the prior MRI and are nonspecific but compatible with moderately severe chronic small vessel ischemic disease. Chronic infarcts are again seen in the basal ganglia, thalami, and cerebellum. Scattered chronic cerebral and cerebellar microhemorrhages are stable to minimally increased from the prior MRI. There are chronic blood products associated with the chronic infarct involving the left basal ganglia and corona radiata. The sella is unremarkable. Normal flow voids. ORBITS: Bilateral cataract extraction. SINUSES AND MASTOIDS: Chronic right sphenoid sinusitis. Clear mastoid air cells. BONES AND SOFT TISSUES: Normal marrow signal. No acute soft tissue abnormality. IMPRESSION: 1. No acute intracranial abnormality. 2. Moderately severe chronic small vessel ischemic disease. Electronically signed by: Dasie Hamburg MD 11/10/2023 02:34 PM EDT RP Workstation: [id number]   DG Chest Port 1 View Result Date: 11/10/2023 CLINICAL DATA:  Status post aortic valve replacement and left atrial appendage clipping on 11/08/2023. EXAM: PORTABLE CHEST 1 VIEW COMPARISON:  11/09/2023 FINDINGS: Stable enlarged cardiac  silhouette, mediastinal tubes, prosthetic aortic valve and left atrial clip. Tortuous and partially calcified thoracic aorta. Minimal left basilar atelectasis with improvement. Clear right lung. Unremarkable bones. IMPRESSION: 1. Minimal left basilar atelectasis with improvement. 2. Stable cardiomegaly. Electronically Signed   By: Elspeth Bathe M.D.   On: 11/10/2023 08:24   DG Chest Port 1 View Result Date: 11/09/2023 CLINICAL DATA:  Status post aortic valve replacement and left atrial appendage clip placement on 11/08/2023. EXAM: PORTABLE CHEST 1 VIEW COMPARISON:  11/08/2023 FINDINGS: The cardiac silhouette remains borderline enlarged. Stable right jugular Swan-Ganz catheter with its tip in the main pulmonary artery segment or proximal right main pulmonary artery. The endotracheal and nasogastric tubes have been removed. Stable mediastinal tubes without pneumomediastinum. Interval mild-to-moderate left lower lobe atelectasis. Stable mild peribronchial thickening and chronic accentuation of the interstitial markings. Unremarkable bones. IMPRESSION: 1. Interval mild-to-moderate left lower lobe atelectasis. 2. Stable mild bronchitic changes. 3. Stable borderline cardiomegaly. Electronically Signed   By: Elspeth Bathe M.D.   On: 11/09/2023 11:06   Overnight EEG with video Result Date: 11/09/2023 Shelton Arlin KIDD, MD     11/13/2023  5:45 PM Patient Name: BOYSIE BONEBRAKE MRN: [id number] Epilepsy Attending: Arlin KIDD Shelton Referring Physician/Provider: Khaliqdina, Salman, MD Duration: 11/09/2023 0530 to 11/10/2023 0344 Patient history: 78yo M with new onset seizure with Left arm and L leg and L face twitching with L gaze. Had a seizure lasting 1 mins, followed shortly by a second seizure. EEG to evaluate for seizure Level of alertness: comatose/ lethargic AEDs during EEG study: LEV, PHT, Ativan  Technical aspects: This EEG study was done with scalp electrodes positioned according to the 10-20 International system of  electrode placement. Electrical activity was reviewed with band pass filter of 1-70Hz , sensitivity of 7 uV/mm, display speed of 68mm/sec with a 60Hz  notched filter applied as appropriate. EEG data were recorded continuously and digitally stored.  Video monitoring was available and reviewed as appropriate. Description: EEG showed continuous generalized and lateralized left hemisphere 3 to 6 Hz theta-delta slowing admixed with 15 to 18 Hz beta activity distributed symmetrically and diffusely. Hyperventilation and photic stimulation  were not performed.   Patient pulled electrodes on 11/10/2023 at around 0344 and subsequently the study was not interpretable ABNORMALITY - Continuous slow, generalized and lateralized left hemisphere IMPRESSION: This study is suggestive of cortical dysfunction arising from left hemisphere likely secondary to underlying structural abnormality, post ictal state. Additionally there is generalized cerebral dysfunction/ encephalopath likely related to sedation. No seizures or epileptiform discharges were seen throughout the recording. Priyanka MALVA Krebs   CT ANGIO HEAD NECK W WO CM (CODE STROKE) Result Date: 11/09/2023 EXAM: CT HEAD WITHOUT CTA HEAD AND NECK WITH AND WITHOUT 11/09/2023 12:13:14 AM TECHNIQUE: CTA of the head and neck was performed with and without the administration of 75mL iohexol  (OMNIPAQUE ) 350 MG/ML injection. Noncontrast CT of the head with reconstructed 2-D images are also provided for review. Multiplanar 2D and/or 3D reformatted images are provided for review. Automated exposure control, iterative reconstruction, and/or weight based adjustment of the mA/kV was utilized to reduce the radiation dose to as low as reasonably achievable. COMPARISON: CT head from earlier today. CLINICAL HISTORY: Neuro deficit, acute, stroke suspected. FINDINGS: CT HEAD: BRAIN AND VENTRICLES: No acute intracranial hemorrhage. No mass effect. No midline shift. No extra-axial fluid collection. No  evidence of acute infarct. No hydrocephalus. ORBITS: No acute abnormality. SINUSES AND MASTOIDS: No acute abnormality. CTA NECK: AORTIC ARCH AND ARCH VESSELS: No dissection or arterial injury. No significant stenosis of the brachiocephalic or subclavian arteries. CERVICAL CAROTID ARTERIES: Approximately 40% stenosis of the proximal left ICA due to atherosclerosis. No dissection. CERVICAL VERTEBRAL ARTERIES: Moderate narrowing of the left vertebral artery origin. No dissection, arterial injury, or significant stenosis in the right vertebral artery. LUNGS AND MEDIASTINUM: Subcutaneous emphysema tracking in the lower neck and chest wall without visible pneumothorax. SOFT TISSUES: Subcutaneous emphysema tracking in the lower neck and chest wall. BONES: No acute abnormality. CTA HEAD: ANTERIOR CIRCULATION: No significant stenosis of the internal carotid arteries. No significant stenosis of the anterior cerebral arteries. No significant stenosis of the middle cerebral arteries. No aneurysm. POSTERIOR CIRCULATION: No significant stenosis of the posterior cerebral arteries. No significant stenosis of the basilar artery. No significant stenosis of the vertebral arteries. No aneurysm. OTHER: No dural venous sinus thrombosis on this non-dedicated study. IMPRESSION: 1. No large vessel occlusion. 2. Approximately 40% stenosis of the proximal left ICA due to atherosclerosis. 3. Moderate stenosis at the left vertebral artery origin. 4. Subcutaneous emphysema in the lower neck and chest wall without visible pneumothorax. Electronically signed by: Gilmore Molt MD 11/09/2023 12:37 AM EDT RP Workstation: [id number]   CT HEAD CODE STROKE WO CONTRAST Result Date: 11/09/2023 EXAM: CT HEAD WITHOUT CONTRAST 11/09/2023 12:07:17 AM TECHNIQUE: CT of the head was performed without the administration of intravenous contrast. Automated exposure control, iterative reconstruction, and/or weight based adjustment of the mA/kV was utilized to  reduce the radiation dose to as low as reasonably achievable. COMPARISON: None available. CLINICAL HISTORY: Neuro deficit, acute, stroke suspected. Stroke alert: Seizure of left, weak on the right; Neuro: Sal: [phone] FINDINGS: BRAIN AND VENTRICLES: No acute hemorrhage. No evidence of acute infarct. No hydrocephalus. No extra-axial collection. No mass effect or midline shift. Remote left basal ganglia lacunar infarct. Patchy white matter hypodensities, compatible with chronic microvascular ischemic disease. ORBITS: No acute abnormality. SINUSES: No acute abnormality. SOFT TISSUES AND SKULL: No acute soft tissue abnormality. No skull fracture. Findings conveyed to Dr. Sal via pager at 12:19 PM. IMPRESSION: 1. No acute intracranial abnormality. 2. Remote left basal ganglia lacunar infarct. Electronically signed by: Gilmore  Joshua MD 11/09/2023 12:20 AM EDT RP Workstation: [id number]   DG Chest Port 1 View Result Date: 11/08/2023 CLINICAL DATA:  [id number] Pneumothorax [id number] EXAM: PORTABLE CHEST 1 VIEW COMPARISON:  Chest x-ray 11/01/2023 FINDINGS: Interval placement of an endotracheal tube with tip terminating approximately 4 5 cm above the carina. Enteric tube courses below the diaphragm with tip collimated off view and side port at the expected region of the gastroesophageal junction. Right internal jugular approach Swan-Ganz catheter with tip overlying the expected region of the proximal right main pulmonary artery. Couple mediastinal drains noted. The heart and mediastinal contours are within normal limits. Aortic valve replacement. Atrial appendage clip. Coarsened interstitial markings with no overt pulmonary edema. No focal consolidation. No pleural effusion. No pneumothorax. No acute osseous abnormality.  Sternotomy wires are intact. IMPRESSION: 1. No radiographic evidence of pneumothorax. 2. Lines and tubes as above. Electronically Signed   By: Morgane  Naveau M.D.   On: 11/08/2023 18:30   ECHO  INTRAOPERATIVE TEE Result Date: 11/08/2023  *INTRAOPERATIVE TRANSESOPHAGEAL REPORT *  Patient Name:   BRIGHTON PILLEY Date of Exam: 11/08/2023 Medical Rec #:  [id number]        Height:       71.0 in Accession #:    [id number]       Weight:       136.9 lb Date of Birth:  [birth year]       BSA:          1.79 m Patient Age:    77 years         BP:           124/57 mmHg Patient Gender: M                HR:           56 bpm. Exam Location:  Inpatient Transesophogeal exam was perform intraoperatively during surgical procedure. Patient was closely monitored under general anesthesia during the entirety of examination. Indications:     aortic valve replacement Performing Phys: [id number] CON RAMAN SU Diagnosing Phys: Kelly Mace MD Complications: No known complications during this procedure. POST-OP IMPRESSIONS Limited post- CPB exam: The patient separated easily from CPB _ Left Ventricle: The left ventricular function is essnetially unchanged from pre-bypass images. Overall fuction remains low normal. The initial septal flattening with dual chamber pacing resolved once Atrial pacing was successful. Overall EF 50%. _ Right Ventricle: The right ventricular function appears normal, unchanged from pre-bypass images. _ Aortic Valve: There has been interval replacement of the aortic valve. Artificial valve in the aortic position Manufactured by; Inspiris Size; 27mm. There is no regurgitation, and no perivalvular leak. There is no stenosis, with peak gradient 7 mmHg, mean gradient 3 mmHg. _ Mitral Valve: The mitral valve function appears unchanged from pre-bypass images. There is mild Mitral regurgitation. _ Tricuspid Valve: The tricuspid valve function appears normal, unchanged from pre-bypass images -There has been interval cipping/closure of the Left Atrial Appendage. PRE-OP FINDINGS  Left Ventricle: The left ventricle has moderately reduced systolic function, with an ejection fraction of 35-40%, measured 40%. The cavity size  was normal. Left ventrical global hypokinesis without regional wall motion abnormalities. There is no left ventricular hypertrophy. Left ventricular diastolic function was not evaluated. Right Ventricle: The right ventricle has normal systolic function. The cavity was normal. There is no increase in right ventricular wall thickness. Catheter present in the right ventricle. Left Atrium: Left atrial size was normal in size. No left atrial/left atrial appendage thrombus  was detected. Left atrial appendage velocity is normal at greater than 40 cm/s. Right Atrium: Right atrial size was normal in size. Catheter present in the right atrium. Interatrial Septum: No atrial level shunt detected by color flow Doppler. There is no evidence of a patent foramen ovale. Pericardium: There is no evidence of pericardial effusion. Mitral Valve: The mitral valve is normal in structure. Mitral valve regurgitation is not visualized by color flow Doppler. Pulmonary venous flow is normal. There is no evidence of mitral stenosis, with mean gradient 1 mmHg. Tricuspid Valve: The tricuspid valve was normal in structure. Tricuspid valve regurgitation is trivial by color flow Doppler. No evidence of tricuspid stenosis is present. There is no evidence of tricuspid valve vegetation. Aortic Valve: The aortic valve is tricuspid. Aortic valve regurgitation is severe by color flow Doppler. The jet is centrally-directed. There is no stenosis of the aortic valve, with mean gradient 4 mmHg, peak gradient 9 mmHg. There is no evidence of aortic valve vegetation. Pulmonic Valve: The pulmonic valve was normal in structure, with normal leaflet mobility and excursion. No evidence of pulmonic stenosis. Pulmonic valve regurgitation is trivial, around the PA catheter, by color flow Doppler. Aorta: The aortic root and ascending aorta are normal in size and structure. The aortic arch was not well visualized. There is evidence of plaque in the descending aorta; Grade  I, measuring 1-64mm in size. The LVOT measures 2.3 cm diameter, ascending aorta measures 3.8 cm. Pulmonary Artery: Norva Purl catheter present on the left. The pulmonary artery is of normal size. Venous: The inferior vena cava was not well visualized. +-------------+--------++ AORTIC VALVE          +-------------+--------++ AV Mean Grad:4.0 mmHg +-------------+--------++ +-------------+--------++ MITRAL VALVE          +-------------+--------++ MV Mean grad:1.0 mmHg +-------------+--------++  Kelly Mace MD Electronically signed by Kelly Mace MD Signature Date/Time: 11/08/2023/5:47:21 PM    Final    VAS US  UPPER EXTREMITY ARTERIAL DUPLEX Result Date: 11/07/2023  UPPER EXTREMITY DUPLEX STUDY Patient Name:  KEVONTAE BURGOON  Date of Exam:   11/07/2023 Medical Rec #: [id number]         Accession #:    [id number] Date of Birth: August 02, 1945        Patient Gender: M Patient Age:   39 years Exam Location:  [facility] Procedure:      VAS US  UPPER EXTREMITY ARTERIAL DUPLEX Referring Phys: REDIA CLEAVER --------------------------------------------------------------------------------  Indications: Wrist pain, bruising, palpable knot and Pain right antecubital              fossa area. History:     Patient has a history of catheterization via right radial artery.  Risk Factors: Hypertension, hyperlipidemia. Performing Technologist: Ricka Sturdivant-Jones RDMS, RVT  Examination Guidelines: A complete evaluation includes B-mode imaging, spectral Doppler, color Doppler, and power Doppler as needed of all accessible portions of each vessel. Bilateral testing is considered an integral part of a complete examination. Limited examinations for reoccurring indications may be performed as noted.  Right Doppler Findings: +---------------+----------+---------+--------+--------+ Site           PSV (cm/s)Waveform StenosisComments +---------------+----------+---------+--------+--------+ Subclavian  Dist          triphasic                 +---------------+----------+---------+--------+--------+ Brachial Prox  82        triphasic                 +---------------+----------+---------+--------+--------+ Brachial Dist  154       triphasic                 +---------------+----------+---------+--------+--------+ Radial Prox    138       triphasic                 +---------------+----------+---------+--------+--------+ Radial Mid     125       triphasic                 +---------------+----------+---------+--------+--------+ Radial Dist    108       triphasic                 +---------------+----------+---------+--------+--------+ Ulnar Dist     122       triphasic                 +---------------+----------+---------+--------+--------+    Summary:  Right: No evidence of right radial pseudoaneurysm or AVF. Patent        brachial artery. *See table(s) above for measurements and observations. Electronically signed by Debby Robertson on 11/07/2023 at 4:57:49 PM.    Final    CARDIAC CATHETERIZATION Result Date: 11/06/2023 1.  Patent coronary arteries with mild diffuse calcific plaquing but no significant stenoses 2.  Known severe aortic insufficiency with wide pulse pressure noted 3.  Right heart data: RA mean 3 mmHg RV 30/5 mmHg PA 30/14 mean 20 mmHg Pulmonary wedge pressure 13 mmHg (A-wave 12, V wave 15) LVEDP 9 mmHg Cardiac output/index by Fick measurement 6.1/3.4   VAS US  DOPPLER PRE CABG Result Date: 11/06/2023 PREOPERATIVE VASCULAR EVALUATION Patient Name:  DAMIN SALIDO  Date of Exam:   11/04/2023 Medical Rec #: [id number]         Accession #:    [id number] Date of Birth: May 10, 1945        Patient Gender: M Patient Age:   11 years Exam Location:  [facility] Procedure:      VAS US  DOPPLER PRE CABG Referring Phys: BAILEY CHAMBERS --------------------------------------------------------------------------------  Indications:      Pre-CABG. Risk Factors:      Hypertension, hyperlipidemia. Comparison Study: No prior studies. Performing Technologist: Gerome Ny RVT  Examination Guidelines: A complete evaluation includes B-mode imaging, spectral Doppler, color Doppler, and power Doppler as needed of all accessible portions of each vessel. Bilateral testing is considered an integral part of a complete examination. Limited examinations for reoccurring indications may be performed as noted.  Right Carotid Findings: +----------+--------+-------+--------+--------------------------------+--------+           PSV cm/sEDV    StenosisDescribe                        Comments                   cm/s                                                    +----------+--------+-------+--------+--------------------------------+--------+ CCA Prox  151     18             smooth, heterogenous and  calcific                                 +----------+--------+-------+--------+--------------------------------+--------+ CCA Distal90      12             smooth and heterogenous                  +----------+--------+-------+--------+--------------------------------+--------+ ICA Prox  85      17             irregular and heterogenous               +----------+--------+-------+--------+--------------------------------+--------+ ICA Mid   80      16                                             tortuous +----------+--------+-------+--------+--------------------------------+--------+ ICA Distal77      13                                             tortuous +----------+--------+-------+--------+--------------------------------+--------+ ECA       133     3                                                       +----------+--------+-------+--------+--------------------------------+--------+ +----------+--------+-------+--------+------------+           PSV cm/sEDV cmsDescribeArm Pressure  +----------+--------+-------+--------+------------+ Subclavian84                                  +----------+--------+-------+--------+------------+ +---------+--------+--+--------+--+---------+ VertebralPSV cm/s49EDV cm/s12Antegrade +---------+--------+--+--------+--+---------+ Left Carotid Findings: +----------+--------+--------+--------+-----------------------+--------+           PSV cm/sEDV cm/sStenosisDescribe               Comments +----------+--------+--------+--------+-----------------------+--------+ CCA Prox  95      11                                              +----------+--------+--------+--------+-----------------------+--------+ CCA Distal110     9               smooth and heterogenous         +----------+--------+--------+--------+-----------------------+--------+ ICA Prox  128     20              smooth and heterogenous         +----------+--------+--------+--------+-----------------------+--------+ ICA Mid   132     21                                              +----------+--------+--------+--------+-----------------------+--------+ ICA Distal144     17  tortuous +----------+--------+--------+--------+-----------------------+--------+ ECA       103     1                                               +----------+--------+--------+--------+-----------------------+--------+ +----------+--------+--------+--------+------------+ SubclavianPSV cm/sEDV cm/sDescribeArm Pressure +----------+--------+--------+--------+------------+           92                                   +----------+--------+--------+--------+------------+ +---------+--------+--+--------+-+---------+ VertebralPSV cm/s55EDV cm/s7Antegrade +---------+--------+--+--------+-+---------+  ABI Findings: +------------------+-----+-----------+ Rt Pressure (mmHg)IndexWaveform    +------------------+-----+-----------+ 124                     triphasic   +------------------+-----+-----------+ 187               1.51 multiphasic +------------------+-----+-----------+ 161               1.30 multiphasic +------------------+-----+-----------+ +------------------+-----+----------+ Lt Pressure (mmHg)IndexWaveform   +------------------+-----+----------+ 118                    triphasic  +------------------+-----+----------+ 254               2.05 monophasic +------------------+-----+----------+ 58                0.47 monophasic +------------------+-----+----------+ +-------+---------------+ ABI/TBIToday's ABI/TBI +-------+---------------+ Right  1.51            +-------+---------------+ Left   0.47            +-------+---------------+  Right Doppler Findings: +--------+--------+---------+ Site    PressureDoppler   +--------+--------+---------+ [id number]     triphasic +--------+--------+---------+ Radial          triphasic +--------+--------+---------+ Ulnar           triphasic +--------+--------+---------+  Left Doppler Findings: +--------+--------+---------+ Site    PressureDoppler   +--------+--------+---------+ [id number]     triphasic +--------+--------+---------+ Radial          triphasic +--------+--------+---------+ Ulnar           triphasic +--------+--------+---------+   Summary: Right Carotid: Velocities in the right ICA are consistent with a 1-39% stenosis. Left Carotid: Velocities in the left ICA are consistent with a 1-39% stenosis. Vertebrals: Bilateral vertebral arteries demonstrate antegrade flow. Right ABI: Resting right ankle-brachial index indicates noncompressible right lower extremity arteries. Left ABI: Resting left ankle-brachial index indicates severe left lower extremity arterial disease. Right Upper Extremity: Doppler waveform obliterate with right radial compression. Doppler waveform obliterate with right ulnar compression. Left Upper Extremity:  Doppler waveform obliterate with left radial compression. Doppler waveforms decrease >50% with left ulnar compression.  Electronically signed by Gaile New MD on 11/06/2023 at 8:16:53 AM.    Final    ECHOCARDIOGRAM COMPLETE Result Date: 11/03/2023    ECHOCARDIOGRAM REPORT   Patient Name:   CAYDN JUSTEN Date of Exam: 11/02/2023 Medical Rec #:  [id number]        Height:       71.0 in Accession #:    [id number]       Weight:       153.2 lb Date of Birth:  [birth year]       BSA:          1.883 m Patient Age:    34 years  BP:           112/64 mmHg Patient Gender: M                HR:           74 bpm. Exam Location:  Inpatient Procedure: 2D Echo, Cardiac Doppler and Color Doppler (Both Spectral and Color            Flow Doppler were utilized during procedure).                                 MODIFIED REPORT:  This report was modified by Vinie Maxcy MD on 11/03/2023 due to Re-evaluated           aortic insufficiency in light of more recent TEE comparison.  Indications:     Aortic regurgitation  History:         Patient has prior history of Echocardiogram examinations, most                  recent 04/21/2023. CHF, COPD, Signs/Symptoms:Shortness of                  Breath; Risk Factors:Hypertension and Former Smoker.  Sonographer:     Juliene Rucks Referring Phys:  [id number] ZANE ADAMS Diagnosing Phys: Vinie Maxcy MD  Sonographer Comments: Technically difficult study due to poor echo windows. Image acquisition challenging due to COPD and Image acquisition challenging due to respiratory motion. IMPRESSIONS  1. Technically difficult study, images off axis  2. Left ventricular ejection fraction, by estimation, is 60 to 65%. Left ventricular ejection fraction by PLAX is 61 %. The left ventricle has normal function. The left ventricle has no regional wall motion abnormalities. Left ventricular diastolic parameters are consistent with Grade I diastolic dysfunction (impaired relaxation).  3. Right ventricular systolic  function is normal. The right ventricular size is normal.  4. The mitral valve is normal in structure. No evidence of mitral valve regurgitation.  5. Calcified aortic root. There appears to be eccentric, posteriorly directed regurgitation (worst on image 77). The aortic valve was not well visualized. Aortic valve regurgitation is moderate. Aortic valve sclerosis/calcification is present, without any evidence of aortic stenosis. Comparison(s): Changes from prior study are noted. 07/24/2023: LVEF 50-55%, severe AI with oscillating density on the left coronary cusp. FINDINGS  Left Ventricle: Left ventricular ejection fraction, by estimation, is 60 to 65%. Left ventricular ejection fraction by PLAX is 61 %. The left ventricle has normal function. The left ventricle has no regional wall motion abnormalities. The left ventricular internal cavity size was normal in size. There is no left ventricular hypertrophy. Left ventricular diastolic parameters are consistent with Grade I diastolic dysfunction (impaired relaxation). Indeterminate filling pressures. Right Ventricle: The right ventricular size is normal. No increase in right ventricular wall thickness. Right ventricular systolic function is normal. Left Atrium: Left atrial size was normal in size. Right Atrium: Right atrial size was normal in size. Pericardium: There is no evidence of pericardial effusion. Mitral Valve: The mitral valve is normal in structure. No evidence of mitral valve regurgitation. Tricuspid Valve: The tricuspid valve is not well visualized. Tricuspid valve regurgitation is not demonstrated. Aortic Valve: Calcified aortic root. There appears to be eccentric, posteriorly directed regurgitation (worst on image 77). The aortic valve was not well visualized. There is moderate aortic valve annular calcification. Aortic valve regurgitation is moderate. Aortic valve sclerosis/calcification is present, without any evidence of  aortic stenosis. Pulmonic Valve:  The pulmonic valve was normal in structure. Pulmonic valve regurgitation is not visualized. Aorta: The aortic root and ascending aorta are structurally normal, with no evidence of dilitation. Venous: The inferior vena cava was not well visualized. IAS/Shunts: The interatrial septum was not well visualized.  LEFT VENTRICLE PLAX 2D LV EF:         Left            Diastology                ventricular     LV e' medial:    4.97 cm/s                ejection        LV E/e' medial:  7.8                fraction by     LV e' lateral:   7.77 cm/s                PLAX is 61      LV E/e' lateral: 5.0                %. LVIDd:         4.00 cm LVIDs:         2.70 cm LV PW:         1.10 cm LV IVS:        1.00 cm LVOT diam:     2.10 cm LVOT Area:     3.46 cm  RIGHT VENTRICLE RV Basal diam:  3.30 cm RV Mid diam:    3.00 cm RV S prime:     17.60 cm/s TAPSE (M-mode): 1.5 cm LEFT ATRIUM           Index        RIGHT ATRIUM           Index LA diam:      2.70 cm 1.43 cm/m   RA Area:     13.80 cm LA Vol (A4C): 37.3 ml 19.81 ml/m  RA Volume:   33.60 ml  17.85 ml/m  MITRAL VALVE MV Area (PHT): 2.83 cm    SHUNTS MV Decel Time: 268 msec    Systemic Diam: 2.10 cm MV E velocity: 38.80 cm/s MV A velocity: 63.40 cm/s MV E/A ratio:  0.61 Vinie Maxcy MD Electronically signed by Vinie Maxcy MD Signature Date/Time: 11/02/2023/2:12:15 PM    Final (Updated)    CT CHEST WO CONTRAST Result Date: 11/02/2023 CLINICAL DATA:  CABG workup short of breath EXAM: CT CHEST WITHOUT CONTRAST TECHNIQUE: Multidetector CT imaging of the chest was performed following the standard protocol without IV contrast. RADIATION DOSE REDUCTION: This exam was performed according to the departmental dose-optimization program which includes automated exposure control, adjustment of the mA and/or kV according to patient size and/or use of iterative reconstruction technique. COMPARISON:  Chest x-ray 11/01/2023, chest CT 07/17/2019 FINDINGS: Cardiovascular: Limited assessment  without intravenous contrast. Advanced aortic atherosclerosis. Mild aneurysmal dilatation of the ascending aorta, measuring 4.1 cm maximum. Multi-vessel advanced coronary vascular calcification. Normal cardiac size. No pericardial effusion Mediastinum/Nodes: Patent trachea. No thyroid  mass. No suspicious lymph nodes. Esophagus within normal limits. Lungs/Pleura: Emphysema. Scarring within the left upper and lower lobes. Band like density and thickening along the right posterior pleural surface with a few scattered calcifications suggestive of chronic scarring. Mild right lower lobe bronchiectasis. These findings are new when compared to lung bases from CT of September  2024. A few scattered punctate pulmonary nodules, largest seen in the left base and measures 3 mm on series 3, image 119. Upper Abdomen: No acute finding. Multiple renal cysts incompletely visualize, no specific imaging follow-up is recommended. Numerous pancreatic calcifications with pancreatic atrophy consistent with chronic pancreatitis. Musculoskeletal: No acute osseous abnormality. IMPRESSION: 1. Emphysema. Scarring within the left upper and lower lobes. Band like density and thickening along the right posterior pleural surface with a few scattered calcifications suggestive of chronic scarring but new compared with 2024. Mild right lower lobe bronchiectasis. 2. Few small punctate pulmonary nodules measuring up to 3 mm No follow-up needed if patient is low-risk (and has no known or suspected primary neoplasm). Non-contrast chest CT can be considered in 12 months if patient is high-risk. This recommendation follows the consensus statement: Guidelines for Management of Incidental Pulmonary Nodules Detected on CT Images: From the Fleischner Society 2017; Radiology 2017; 284:228-243. 3. Aortic atherosclerosis. Mild aneurysmal dilatation of ascending aorta up to 4.1 cm. Recommend annual imaging followup by CTA or MRA. This recommendation follows 2010  ACCF/AHA/AATS/ACR/ASA/SCA/SCAI/SIR/STS/SVM Guidelines for the Diagnosis and Management of Patients with Thoracic Aortic Disease. Circulation. 2010; 121: Z733-z630. Aortic aneurysm NOS (ICD10-I71.9) 4. Findings consistent with chronic pancreatitis Aortic Atherosclerosis (ICD10-I70.0) and Emphysema (ICD10-J43.9). Electronically Signed   By: Luke Bun M.D.   On: 11/02/2023 21:21   VAS US  LOWER EXTREMITY VENOUS (DVT) Result Date: 11/02/2023  Lower Venous DVT Study Patient Name:  KEEON ZURN  Date of Exam:   11/02/2023 Medical Rec #: [id number]         Accession #:    [id number] Date of Birth: 1945-03-04        Patient Gender: M Patient Age:   4 years Exam Location:  [facility] Procedure:      VAS US  LOWER EXTREMITY VENOUS (DVT) Referring Phys: EKTA PATEL --------------------------------------------------------------------------------  Indications: SOB, and Edema (Leg size one is larger than other).  Comparison Study: No previous exams Performing Technologist: Jody Hill RVT, RDMS  Examination Guidelines: A complete evaluation includes B-mode imaging, spectral Doppler, color Doppler, and power Doppler as needed of all accessible portions of each vessel. Bilateral testing is considered an integral part of a complete examination. Limited examinations for reoccurring indications may be performed as noted. The reflux portion of the exam is performed with the patient in reverse Trendelenburg.  +---------+---------------+---------+-----------+----------+--------------+ RIGHT    CompressibilityPhasicitySpontaneityPropertiesThrombus Aging +---------+---------------+---------+-----------+----------+--------------+ CFV      Full           Yes      Yes                                 +---------+---------------+---------+-----------+----------+--------------+ SFJ      Full                                                         +---------+---------------+---------+-----------+----------+--------------+ FV Prox  Full           Yes      Yes                                 +---------+---------------+---------+-----------+----------+--------------+ FV Mid   Full  Yes      Yes                                 +---------+---------------+---------+-----------+----------+--------------+ FV DistalFull           Yes      Yes                                 +---------+---------------+---------+-----------+----------+--------------+ PFV      Full                                                        +---------+---------------+---------+-----------+----------+--------------+ POP      Full           Yes      Yes                                 +---------+---------------+---------+-----------+----------+--------------+ PTV      Full                                                        +---------+---------------+---------+-----------+----------+--------------+ PERO     Full                                                        +---------+---------------+---------+-----------+----------+--------------+   +---------+---------------+---------+-----------+----------+--------------+ LEFT     CompressibilityPhasicitySpontaneityPropertiesThrombus Aging +---------+---------------+---------+-----------+----------+--------------+ CFV      Full           Yes      Yes                                 +---------+---------------+---------+-----------+----------+--------------+ SFJ      Full                                                        +---------+---------------+---------+-----------+----------+--------------+ FV Prox  Full           Yes      Yes                                 +---------+---------------+---------+-----------+----------+--------------+ FV Mid   Full           Yes      Yes                                  +---------+---------------+---------+-----------+----------+--------------+ FV DistalFull           Yes      Yes                                 +---------+---------------+---------+-----------+----------+--------------+  PFV      Full                                                        +---------+---------------+---------+-----------+----------+--------------+ POP      Full           Yes      Yes                                 +---------+---------------+---------+-----------+----------+--------------+ PTV      Full                                                        +---------+---------------+---------+-----------+----------+--------------+ PERO     Full                                                        +---------+---------------+---------+-----------+----------+--------------+     Summary: BILATERAL: - No evidence of deep vein thrombosis seen in the lower extremities, bilaterally. -No evidence of popliteal cyst, bilaterally.   *See table(s) above for measurements and observations. Electronically signed by Debby Robertson on 11/02/2023 at 5:37:58 PM.    Final    DG Chest 2 View Result Date: 11/01/2023 CLINICAL DATA:  Dyspnea on exertion. EXAM: CHEST - 2 VIEW COMPARISON:  04/24/2023 and 04/20/2023 as well as 11/05/2022 FINDINGS: Lungs are adequately inflated without acute airspace consolidation or effusion. No pneumothorax. Possible small nodular opacity over the right mid to lower lung. Cardiomediastinal silhouette and remainder of the exam is unchanged. IMPRESSION: 1. No acute cardiopulmonary disease. 2. Possible small nodular opacity over the right mid to lower lung. Recommend follow-up chest radiograph 3 months. If persistent, recommend noncontrast chest CT for further evaluation. Electronically Signed   By: Toribio Agreste M.D.   On: 11/01/2023 10:50         ASSESSMENT & PLAN   Assessment & Plan Insomnia, unspecified type Most critical concern today by far.   Severe.  Chronic insomnia is worsened by recent hospitalization and possible medication buildup from acute kidney injury. Previous treatments with trazodone , melatonin, and CBD gummies were ineffective.  Reports no sleep for days. He is at high risk with sedatives due to concurrent opioid use, age, and kidney function. Prescribed Dayvigo  (lemborexant ) for sleep, pending insurance approval. He should trial one pill to assess effectiveness and maintain a two-hour separation between Dayvigo  and oxycodone  to avoid respiratory depression. Increased fluid intake is encouraged to aid kidney function and medication clearance.  He will stop risperidone, amitriptyline ,  glipizide , and half his dose on keppra .  Chronic bilateral low back pain with bilateral sciatica Neck pain High risk medication use Chronic low back pain is managed with high-dose opioids, including oxycodone , which is being refilled. Pain management is complicated by high-risk medication use and recent hospitalization. Continue current oxycodone  regimen with 90 tablets prescribed. He is encouraged to follow up with a pain management specialist for ongoing care.   Concurrent  use of opioids and multiple other medications may contribute to current health issues. Reviewed and adjusted medication regimen to reduce high-risk medications. Provided a detailed handout outlining medication changes, including discontinuation and dose reductions.  There was issue with pain management specialist and transferring to new one has been challenging. I have continued to bridge his medication in interim. Has been on opioids many years and is supervised by his son for appropriate use.  PDMP reviewed during this encounter.  AKI (acute kidney injury) Acute kidney injury superimposed on chronic kidney disease Acute kidney injury is likely secondary to contrast dye from recent valve replacement surgery, compromising kidney function and leading to medication buildup and  potential toxicity. Discontinued or reduced doses of medications that may accumulate, including ropinirole , amitriptyline , and Keppra . Increased fluid intake is encouraged to promote kidney function and medication clearance. Labs are ordered to monitor kidney function and medication levels. Lab Results  Component Value Date   GFR 43.77 (L) 01/18/2024   GFR 67.73 05/03/2022   GFR 78.99 12/14/2020   GFR 69.77 02/06/2019   GFR 79.23 10/13/2015   GFR 104.35 09/08/2015   GFR 91.16 04/09/2014   Lab Results  Component Value Date   EGFR 26.0 01/01/2024   EGFR 22 (L) 12/26/2023   EGFR 23 (L) 12/14/2023   EGFR 39 (L) 12/04/2023   EGFR 27 (L) 10/10/2023   EGFR 30 (L) 08/30/2023   EGFR 32 (L) 08/29/2023   EGFR 30 (L) 08/14/2023   EGFR 37 (L) 07/20/2023   EGFR 44 (L) 06/07/2023   EGFR 39 (L) 02/22/2023   EGFR 40 (L) 01/17/2023   EGFR 38 (L) 12/28/2022   Secondary hypertension Uncontrolled due to severe insomnia. Monitor after getting sleep and check back in will have staff call to remind for blood pressure check and follow up on sleep response.   ORDER ASSOCIATIONS  #   DIAGNOSIS / CONDITION ICD-10 ENCOUNTER ORDER     ICD-10-CM   1. Insomnia, unspecified type  G47.00 Lemborexant  (DAYVIGO ) 5 MG TABS    Doxepin  HCl 6 MG TABS    ramelteon  (ROZEREM ) 8 MG tablet    2. Chronic bilateral low back pain with bilateral sciatica  M54.42 Oxycodone  HCl 10 MG TABS   M54.41    G89.29     3. Neck pain  M54.2 Oxycodone  HCl 10 MG TABS    4. High risk medication use  Z79.899 Oxycodone  HCl 10 MG TABS    5. AKI (acute kidney injury)  N17.9 Protein / creatinine ratio, urine    Cystatin C with Glomerular Filtration Rate, Estimated (eGFR)    Basic metabolic panel with GFR    Basic metabolic panel with GFR    Cystatin C with Glomerular Filtration Rate, Estimated (eGFR)    Protein / creatinine ratio, urine    6. Acute kidney injury superimposed on chronic kidney disease  N17.9    N18.9     7.  Secondary hypertension  I15.9          Orders Placed in Encounter:   Lab Orders         Protein / creatinine ratio, urine         Cystatin C with Glomerular Filtration Rate, Estimated (eGFR)         Basic metabolic panel with GFR    Meds ordered this encounter  Medications   Lemborexant  (DAYVIGO ) 5 MG TABS    Sig: Take 1 tablet (5 mg total) by mouth at bedtime. Ok to fill just  1 for trial    Dispense:  30 tablet    Refill:  1   Doxepin  HCl 6 MG TABS    Sig: Take 1 tablet (6 mg total) by mouth at bedtime.    Dispense:  30 tablet    Refill:  1   ramelteon  (ROZEREM ) 8 MG tablet    Sig: Take 1 tablet (8 mg total) by mouth at bedtime.    Dispense:  90 tablet    Refill:  3    Ok to fill just 1 for trial affordability   Oxycodone  HCl 10 MG TABS    Sig: Take 1 tablet (10 mg total) by mouth every 8 (eight) hours as needed.    Dispense:  90 tablet    Refill:  0   Orders Placed This Encounter  Procedures   Protein / creatinine ratio, urine    Standing Status:   Future    Number of Occurrences:   1    Expiration Date:   01/17/2025   Cystatin C with Glomerular Filtration Rate, Estimated (eGFR)    Standing Status:   Future    Number of Occurrences:   1    Expiration Date:   01/17/2025   Basic metabolic panel with GFR    Standing Status:   Future    Number of Occurrences:   1    Expiration Date:   01/17/2025     This document was synthesized by artificial intelligence ([employer]) using HIPAA-compliant recording of the clinical interaction;   We discussed the use of AI scribe software for clinical note transcription with the patient, who gave verbal consent to proceed. additional Info: This encounter employed state-of-the-art, real-time, collaborative documentation. The patient actively reviewed and assisted in updating their electronic medical record on a shared screen, ensuring transparency and facilitating joint problem-solving for the problem list, overview, and plan. This approach  promotes accurate, informed care. The treatment plan was discussed and reviewed in detail, including medication safety, potential side effects, and all patient questions. We confirmed understanding and comfort with the plan. Follow-up instructions were established, including contacting the office for any concerns, returning if symptoms worsen, persist, or new symptoms develop, and precautions for potential emergency department visits.

## 2024-01-18 NOTE — Patient Instructions (Addendum)
 It was a pleasure seeing you today! Your health and satisfaction are our top priorities.  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 We reviewed your current treatments and made some adjustments to help manage your symptoms more effectively.  YOUR PLAN: -INSOMNIA: Insomnia is difficulty falling or staying asleep. Your chronic insomnia has worsened due to your recent hospitalization and possible medication buildup from your kidney issues. We have prescribed Dayvigo  (lemborexant ) for sleep, pending insurance approval. Please try one pill to see if it helps and keep a two-hour gap between taking Dayvigo  and oxycodone  to avoid breathing problems. Also, drink more fluids to help your kidneys and clear medications from your body.  -ACUTE KIDNEY INJURY ON CHRONIC KIDNEY DISEASE STAGE 3: Acute kidney injury is a sudden decline in kidney function, which in your case is on top of existing chronic kidney disease. This likely happened due to the contrast dye used during your valve replacement surgery. We have stopped or reduced doses of some medications that can build up in your body, including ropinirole , amitriptyline , and Keppra . Please drink more fluids to help your kidneys and clear medications from your body. We have ordered labs to monitor your kidney function and medication levels.  -CHRONIC BILATERAL LOW BACK PAIN WITH BILATERAL SCIATICA: Chronic low back pain with sciatica is ongoing pain in your lower back that radiates down your legs. You are currently managing this with high-dose opioids, including oxycodone , which we have refilled with 90 tablets. We encourage you to follow up with a pain management specialist for ongoing care.  -HIGH RISK MEDICATION USE: Using opioids along with multiple other medications can increase your  health risks. We reviewed and adjusted your medication regimen to reduce high-risk medications. You have been given a detailed handout outlining these changes, including which medications to stop and which doses to reduce.  INSTRUCTIONS: Please follow up with a pain management specialist for your chronic low back pain. We have ordered labs to monitor your kidney function and medication levels. Ensure you maintain a two-hour gap between taking Dayvigo  and oxycodone . Drink more fluids to help your kidneys and clear medications from your body.  Your Providers PCP: Michael Michael MATSU, MD,  [phone]) Referring Provider: [name], MD,  [phone]) Care Team Provider: Evonnie Asberry RAMAN, DO,  [phone]) Care Team Provider: Fleeta Michael, Michael SAILOR, MD,  [phone]) Care Team Provider: Avram Lupita BRAVO, MD,  [phone]) Care Team Provider: Okey Vina GAILS, MD,  [phone]) Care Team Provider: [name], NP,  [phone])  NEXT STEPS: [x]  Early Intervention: Schedule sooner appointment, call our on-call services, or go to emergency room if there is any significant Increase in pain or discomfort New or worsening symptoms Sudden or severe changes in your health [x]  Flexible Follow-Up: We recommend a No follow-ups on file. for optimal routine care. This allows for progress monitoring and treatment adjustments. [x]  Preventive Care: Schedule your annual preventive care visit! It's typically covered by insurance and helps identify potential health issues early. [x]  Lab & X-ray Appointments: Incomplete tests scheduled today, or call to schedule. X-rays: [facility] (M-F, 8:30am-noon or 1pm-5pm). [x]  Medical Information Release: Sign a release form at front desk to obtain relevant medical information we don't have.  MAKING THE MOST OF OUR FOCUSED 20 MINUTE APPOINTMENTS: [x]   Clearly state your top concerns at the  beginning of the visit to focus our discussion [x]    If you anticipate you will need more time, please inform the front desk during scheduling - we can book multiple appointments in the same week. [x]   If you have transportation problems- use our convenient video appointments or ask about transportation support. [x]   We can get down to business faster if you use MyChart to update information before the visit and submit non-urgent questions before your visit. Thank you for taking the time to provide details through MyChart.  Let our nurse know and she can import this information into your encounter documents.  Arrival and Wait Times: [x]   Arriving on time ensures that everyone receives prompt attention. [x]   Early morning (8a) and afternoon (1p) appointments tend to have shortest wait times. [x]   Unfortunately, we cannot delay appointments for late arrivals or hold slots during phone calls.  Getting Answers and Following Up [x]   Simple Questions & Concerns: For quick questions or basic follow-up after your visit, reach us  at [phone] or MyChart messaging. [x]   Complex Concerns: If your concern is more complex, scheduling an appointment might be best. Discuss this with the staff to find the most suitable option. [x]   Lab & Imaging Results: We'll contact you directly if results are abnormal or you don't use MyChart. Most normal results will be on MyChart within 2-3 business days, with a review message from Dr. Jesus. Haven't heard back in 2 weeks? Need results sooner? Contact us  at [phone]. [x]   Referrals: Our referral coordinator will manage specialist referrals. The specialist's office should contact you within 2 weeks to schedule an appointment. Call us  if you haven't heard from them after 2 weeks.  Staying Connected [x]   MyChart: Activate your MyChart for the fastest way to access results and message us . See the last page of this paperwork for instructions on how to activate.  Bring to Your Next Appointment [x]   Medications: Please  bring all your medication bottles to your next appointment to ensure we have an accurate record of your prescriptions. [x]   Health Diaries: If you're monitoring any health conditions at home, keeping a diary of your readings can be very helpful for discussions at your next appointment.  Billing [x]   X-ray & Lab Orders: These are billed by separate companies. Contact the invoicing company directly for questions or concerns. [x]   Visit Charges: Discuss any billing inquiries with our administrative services team.  Your Satisfaction Matters [x]   Share Your Experience: We strive for your satisfaction! If you have any complaints, or preferably compliments, please let Dr. Jesus know directly or contact our Practice Administrators, Manuelita Rubin or [employer], by asking at the front desk.   Reviewing Your Records [x]   Review this early draft of your clinical encounter notes below and the final encounter summary tomorrow on MyChart after its been completed.  All orders placed so far are visible here: Insomnia, unspecified type -     DayVigo ; Take 1 tablet (5 mg total) by mouth at bedtime. Ok to fill just 1 for trial  Dispense: 30 tablet; Refill: 1 -     Doxepin  HCl; Take 1 tablet (6 mg total) by mouth at bedtime.  Dispense: 30 tablet; Refill: 1 -     Ramelteon ; Take 1 tablet (8 mg total) by mouth at bedtime.  Dispense: 90 tablet; Refill: 3  Chronic bilateral low back pain with bilateral sciatica -     oxyCODONE  HCl; Take 1 tablet (10 mg total)  by mouth every 8 (eight) hours as needed.  Dispense: 90 tablet; Refill: 0  Neck pain -     oxyCODONE  HCl; Take 1 tablet (10 mg total) by mouth every 8 (eight) hours as needed.  Dispense: 90 tablet; Refill: 0  High risk medication use -     oxyCODONE  HCl; Take 1 tablet (10 mg total) by mouth every 8 (eight) hours as needed.  Dispense: 90 tablet; Refill: 0  AKI (acute kidney injury) -     Protein / creatinine ratio, urine; Future -     Cystatin C with  Glomerular Filtration Rate, Estimated (eGFR); Future -     Basic metabolic panel with GFR; Future  Acute kidney injury superimposed on chronic kidney disease  Secondary hypertension

## 2024-01-18 NOTE — Assessment & Plan Note (Signed)
 Chronic low back pain is managed with high-dose opioids, including oxycodone , which is being refilled. Pain management is complicated by high-risk medication use and recent hospitalization. Continue current oxycodone  regimen with 90 tablets prescribed. He is encouraged to follow up with a pain management specialist for ongoing care.   Concurrent use of opioids and multiple other medications may contribute to current health issues. Reviewed and adjusted medication regimen to reduce high-risk medications. Provided a detailed handout outlining medication changes, including discontinuation and dose reductions.  There was issue with pain management specialist and transferring to new one has been challenging. I have continued to bridge his medication in interim. Has been on opioids many years and is supervised by his son for appropriate use.  PDMP reviewed during this encounter.

## 2024-01-19 ENCOUNTER — Other Ambulatory Visit (HOSPITAL_BASED_OUTPATIENT_CLINIC_OR_DEPARTMENT_OTHER): Payer: Self-pay

## 2024-01-19 ENCOUNTER — Telehealth: Payer: Self-pay

## 2024-01-19 ENCOUNTER — Encounter: Payer: Self-pay | Admitting: Internal Medicine

## 2024-01-19 LAB — PRO B NATRIURETIC PEPTIDE: NT-Pro BNP: 7929 pg/mL — ABNORMAL HIGH (ref 0–486)

## 2024-01-19 MED ORDER — DAYVIGO 5 MG PO TABS
1.0000 | ORAL_TABLET | Freq: Every day | ORAL | 1 refills | Status: AC
  Filled 2024-01-19: qty 30, 30d supply, fill #0

## 2024-01-19 MED ORDER — DOXEPIN HCL 6 MG PO TABS: 1.0000 | ORAL_TABLET | Freq: Every evening | ORAL | 1 refills | Status: AC

## 2024-01-19 MED ORDER — DAYVIGO 5 MG PO TABS: 1.0000 | ORAL_TABLET | Freq: Every day | ORAL | 1 refills | Status: DC

## 2024-01-19 NOTE — Addendum Note (Signed)
 Addended by: Tiron Suski G on: 01/19/2024 10:00 AM   Modules accepted: Orders

## 2024-01-19 NOTE — Telephone Encounter (Signed)
 Copied from CRM #[id number]. Topic: Clinical - Medication Question >> Jan 18, 2024  2:33 PM Carlyon D wrote: Reason for CRM: Pt calling in regards to medication Ramelteon  for sleeping,  went to pharmacy and they did not have it. Calling asking if script will be sent over today advised pt script was pending. Pt asked if it can be sent over today to make sure it can be picked up by tomorrow  Spoke with son warren was not able to get medication yesterday provider will resent today

## 2024-01-20 ENCOUNTER — Other Ambulatory Visit: Payer: Self-pay | Admitting: Emergency Medicine

## 2024-01-20 LAB — CYSTATIN C WITH GLOMERULAR FILTRATION RATE, ESTIMATED (EGFR)
CYSTATIN C: 1.95 mg/L — ABNORMAL HIGH (ref 0.52–1.16)
eGFR: 30 mL/min/1.73m2 — ABNORMAL LOW

## 2024-01-22 ENCOUNTER — Other Ambulatory Visit

## 2024-01-26 ENCOUNTER — Ambulatory Visit: Payer: Self-pay | Admitting: Emergency Medicine

## 2024-01-26 NOTE — Progress Notes (Signed)
 Spoke with son about dad states he is sleeping more during the day. Also states he will try his best to get bp if possible its hard to even get him to get his weight every day.

## 2024-02-02 ENCOUNTER — Emergency Department (HOSPITAL_BASED_OUTPATIENT_CLINIC_OR_DEPARTMENT_OTHER)

## 2024-02-02 ENCOUNTER — Ambulatory Visit: Admitting: Internal Medicine

## 2024-02-02 ENCOUNTER — Observation Stay (HOSPITAL_BASED_OUTPATIENT_CLINIC_OR_DEPARTMENT_OTHER)
Admission: EM | Admit: 2024-02-02 | Discharge: 2024-02-04 | Disposition: A | Discharge status: Home Health | Attending: Hospitalist | Admitting: Hospitalist

## 2024-02-02 DIAGNOSIS — S0990XA Unspecified injury of head, initial encounter: Secondary | ICD-10-CM | POA: Diagnosis present

## 2024-02-02 DIAGNOSIS — I130 Hypertensive heart and chronic kidney disease with heart failure and stage 1 through stage 4 chronic kidney disease, or unspecified chronic kidney disease: Secondary | ICD-10-CM | POA: Insufficient documentation

## 2024-02-02 DIAGNOSIS — R29702 NIHSS score 2: Secondary | ICD-10-CM

## 2024-02-02 DIAGNOSIS — Z7982 Long term (current) use of aspirin: Secondary | ICD-10-CM | POA: Insufficient documentation

## 2024-02-02 DIAGNOSIS — E1165 Type 2 diabetes mellitus with hyperglycemia: Secondary | ICD-10-CM | POA: Diagnosis not present

## 2024-02-02 DIAGNOSIS — E1122 Type 2 diabetes mellitus with diabetic chronic kidney disease: Secondary | ICD-10-CM | POA: Insufficient documentation

## 2024-02-02 DIAGNOSIS — N1832 Chronic kidney disease, stage 3b: Secondary | ICD-10-CM | POA: Diagnosis not present

## 2024-02-02 DIAGNOSIS — M545 Low back pain, unspecified: Secondary | ICD-10-CM | POA: Diagnosis not present

## 2024-02-02 DIAGNOSIS — R7989 Other specified abnormal findings of blood chemistry: Secondary | ICD-10-CM | POA: Diagnosis not present

## 2024-02-02 DIAGNOSIS — Z743 Need for continuous supervision: Secondary | ICD-10-CM | POA: Diagnosis not present

## 2024-02-02 DIAGNOSIS — I63412 Cerebral infarction due to embolism of left middle cerebral artery: Secondary | ICD-10-CM | POA: Diagnosis not present

## 2024-02-02 DIAGNOSIS — Z8679 Personal history of other diseases of the circulatory system: Secondary | ICD-10-CM | POA: Diagnosis not present

## 2024-02-02 DIAGNOSIS — I2510 Atherosclerotic heart disease of native coronary artery without angina pectoris: Secondary | ICD-10-CM | POA: Diagnosis not present

## 2024-02-02 DIAGNOSIS — Z79899 Other long term (current) drug therapy: Secondary | ICD-10-CM | POA: Insufficient documentation

## 2024-02-02 DIAGNOSIS — F1120 Opioid dependence, uncomplicated: Secondary | ICD-10-CM | POA: Insufficient documentation

## 2024-02-02 DIAGNOSIS — I4581 Long QT syndrome: Secondary | ICD-10-CM | POA: Diagnosis not present

## 2024-02-02 DIAGNOSIS — W19XXXA Unspecified fall, initial encounter: Secondary | ICD-10-CM | POA: Diagnosis present

## 2024-02-02 DIAGNOSIS — I639 Cerebral infarction, unspecified: Secondary | ICD-10-CM | POA: Diagnosis present

## 2024-02-02 DIAGNOSIS — I6389 Other cerebral infarction: Secondary | ICD-10-CM | POA: Insufficient documentation

## 2024-02-02 DIAGNOSIS — I5032 Chronic diastolic (congestive) heart failure: Secondary | ICD-10-CM | POA: Insufficient documentation

## 2024-02-02 DIAGNOSIS — R739 Hyperglycemia, unspecified: Secondary | ICD-10-CM | POA: Diagnosis present

## 2024-02-02 DIAGNOSIS — I10 Essential (primary) hypertension: Secondary | ICD-10-CM | POA: Diagnosis not present

## 2024-02-02 DIAGNOSIS — E785 Hyperlipidemia, unspecified: Secondary | ICD-10-CM | POA: Insufficient documentation

## 2024-02-02 DIAGNOSIS — S0181XA Laceration without foreign body of other part of head, initial encounter: Secondary | ICD-10-CM | POA: Diagnosis not present

## 2024-02-02 DIAGNOSIS — I739 Peripheral vascular disease, unspecified: Secondary | ICD-10-CM | POA: Insufficient documentation

## 2024-02-02 DIAGNOSIS — I63432 Cerebral infarction due to embolism of left posterior cerebral artery: Secondary | ICD-10-CM | POA: Diagnosis not present

## 2024-02-02 DIAGNOSIS — I480 Paroxysmal atrial fibrillation: Secondary | ICD-10-CM | POA: Insufficient documentation

## 2024-02-02 DIAGNOSIS — J449 Chronic obstructive pulmonary disease, unspecified: Secondary | ICD-10-CM | POA: Diagnosis not present

## 2024-02-02 DIAGNOSIS — D631 Anemia in chronic kidney disease: Secondary | ICD-10-CM | POA: Insufficient documentation

## 2024-02-02 LAB — URINALYSIS, ROUTINE W REFLEX MICROSCOPIC
Bilirubin Urine: NEGATIVE
Glucose, UA: >=500 mg/dL — AB
Ketones, ur: NEGATIVE mg/dL
Leukocytes,Ua: NEGATIVE
Nitrite: NEGATIVE
Protein, ur: 100 mg/dL — AB
Specific Gravity, Urine: 1.01 (ref 1.005–1.030)
pH: 6.5 (ref 5.0–8.0)

## 2024-02-02 LAB — COMPREHENSIVE METABOLIC PANEL WITH GFR
ALT: 11 U/L (ref 0–44)
AST: 29 U/L (ref 15–41)
Albumin: 3.9 g/dL (ref 3.5–5.0)
Alkaline Phosphatase: 101 U/L (ref 38–126)
Anion gap: 12 (ref 5–15)
BUN: 20 mg/dL (ref 8–23)
CO2: 23 mmol/L (ref 22–32)
Calcium: 9.2 mg/dL (ref 8.9–10.3)
Chloride: 100 mmol/L (ref 98–111)
Creatinine, Ser: 1.4 mg/dL — ABNORMAL HIGH (ref 0.61–1.24)
GFR, Estimated: 51 mL/min — ABNORMAL LOW
Glucose, Bld: 469 mg/dL — ABNORMAL HIGH (ref 70–99)
Potassium: 3.8 mmol/L (ref 3.5–5.1)
Sodium: 135 mmol/L (ref 135–145)
Total Bilirubin: 0.4 mg/dL (ref 0.0–1.2)
Total Protein: 7.1 g/dL (ref 6.5–8.1)

## 2024-02-02 LAB — GLUCOSE, CAPILLARY: Glucose-Capillary: 240 mg/dL — ABNORMAL HIGH (ref 70–99)

## 2024-02-02 LAB — CBC WITH DIFFERENTIAL/PLATELET
Abs Immature Granulocytes: 0.02 K/uL (ref 0.00–0.07)
Basophils Absolute: 0 K/uL (ref 0.0–0.1)
Basophils Relative: 1 %
Eosinophils Absolute: 0.1 K/uL (ref 0.0–0.5)
Eosinophils Relative: 1 %
HCT: 31.1 % — ABNORMAL LOW (ref 39.0–52.0)
Hemoglobin: 10.4 g/dL — ABNORMAL LOW (ref 13.0–17.0)
Immature Granulocytes: 0 %
Lymphocytes Relative: 19 %
Lymphs Abs: 1.4 K/uL (ref 0.7–4.0)
MCH: 30.4 pg (ref 26.0–34.0)
MCHC: 33.4 g/dL (ref 30.0–36.0)
MCV: 90.9 fL (ref 80.0–100.0)
Monocytes Absolute: 0.6 K/uL (ref 0.1–1.0)
Monocytes Relative: 8 %
Neutro Abs: 5.4 K/uL (ref 1.7–7.7)
Neutrophils Relative %: 71 %
Platelets: 149 K/uL — ABNORMAL LOW (ref 150–400)
RBC: 3.42 MIL/uL — ABNORMAL LOW (ref 4.22–5.81)
RDW: 14.5 % (ref 11.5–15.5)
WBC: 7.5 K/uL (ref 4.0–10.5)
nRBC: 0 % (ref 0.0–0.2)

## 2024-02-02 LAB — MAGNESIUM: Magnesium: 2.1 mg/dL (ref 1.7–2.4)

## 2024-02-02 LAB — URINALYSIS, MICROSCOPIC (REFLEX): Bacteria, UA: NONE SEEN

## 2024-02-02 LAB — URINE DRUG SCREEN
Amphetamines: NEGATIVE
Barbiturates: NEGATIVE
Benzodiazepines: NEGATIVE
Cocaine: NEGATIVE
Fentanyl: NEGATIVE
Methadone Scn, Ur: NEGATIVE
Opiates: NEGATIVE
Tetrahydrocannabinol: NEGATIVE

## 2024-02-02 LAB — AMMONIA: Ammonia: <13 umol/L (ref 9–35)

## 2024-02-02 LAB — TROPONIN T, HIGH SENSITIVITY
Troponin T High Sensitivity: 82 ng/L — ABNORMAL HIGH (ref 0–19)
Troponin T High Sensitivity: 83 ng/L — ABNORMAL HIGH (ref 0–19)

## 2024-02-02 MED ORDER — TETANUS-DIPHTH-ACELL PERTUSSIS 5-2-15.5 LF-MCG/0.5 IM SUSP
0.5000 mL | Freq: Once | INTRAMUSCULAR | Status: AC
  Administered 2024-02-02: 0.5 mL via INTRAMUSCULAR
  Filled 2024-02-02: qty 0.5

## 2024-02-02 MED ORDER — IPRATROPIUM-ALBUTEROL 0.5-2.5 (3) MG/3ML IN SOLN: 3.0000 mL | Freq: Four times a day (QID) | RESPIRATORY_TRACT | Status: DC | PRN

## 2024-02-02 MED ORDER — OXYCODONE HCL 5 MG PO TABS
10.0000 mg | ORAL_TABLET | Freq: Three times a day (TID) | ORAL | Status: DC | PRN
  Administered 2024-02-02 – 2024-02-04 (×4): 10 mg via ORAL
  Filled 2024-02-02 (×4): qty 2

## 2024-02-02 MED ORDER — ACETAMINOPHEN 650 MG RE SUPP: 650.0000 mg | Freq: Four times a day (QID) | RECTAL | Status: DC | PRN

## 2024-02-02 MED ORDER — STROKE: EARLY STAGES OF RECOVERY BOOK
Freq: Once | Status: AC
  Filled 2024-02-02: qty 1

## 2024-02-02 MED ORDER — FENTANYL CITRATE (PF) 50 MCG/ML IJ SOSY
25.0000 ug | PREFILLED_SYRINGE | Freq: Once | INTRAMUSCULAR | Status: AC
  Administered 2024-02-02: 25 ug via INTRAVENOUS
  Filled 2024-02-02: qty 1

## 2024-02-02 MED ORDER — ROSUVASTATIN CALCIUM 20 MG PO TABS
40.0000 mg | ORAL_TABLET | Freq: Every day | ORAL | Status: DC
  Administered 2024-02-02 – 2024-02-04 (×3): 40 mg via ORAL
  Filled 2024-02-02 (×3): qty 2

## 2024-02-02 MED ORDER — LORAZEPAM 2 MG/ML IJ SOLN
0.5000 mg | Freq: Once | INTRAMUSCULAR | Status: AC
  Administered 2024-02-02: 0.5 mg via INTRAVENOUS
  Filled 2024-02-02: qty 1

## 2024-02-02 MED ORDER — NALOXONE HCL 0.4 MG/ML IJ SOLN: 0.4000 mg | INTRAMUSCULAR | Status: DC | PRN

## 2024-02-02 MED ORDER — LIDOCAINE-EPINEPHRINE (PF) 2 %-1:200000 IJ SOLN
10.0000 mL | Freq: Once | INTRAMUSCULAR | Status: AC
  Administered 2024-02-02: 10 mL
  Filled 2024-02-02: qty 20

## 2024-02-02 MED ORDER — HYDRALAZINE HCL 20 MG/ML IJ SOLN
20.0000 mg | Freq: Once | INTRAMUSCULAR | Status: AC
  Administered 2024-02-02: 20 mg via INTRAVENOUS
  Filled 2024-02-02: qty 1

## 2024-02-02 MED ORDER — INSULIN ASPART 100 UNIT/ML IJ SOLN
0.0000 [IU] | Freq: Every day | INTRAMUSCULAR | Status: DC
  Administered 2024-02-02: 2 [IU] via SUBCUTANEOUS
  Administered 2024-02-03: 3 [IU] via SUBCUTANEOUS
  Filled 2024-02-02: qty 3
  Filled 2024-02-02: qty 2

## 2024-02-02 MED ORDER — ACETAMINOPHEN 325 MG PO TABS
650.0000 mg | ORAL_TABLET | Freq: Four times a day (QID) | ORAL | Status: DC | PRN
  Administered 2024-02-03 (×3): 650 mg via ORAL
  Filled 2024-02-02 (×3): qty 2

## 2024-02-02 MED ORDER — ACETAMINOPHEN 160 MG/5ML PO SOLN: 650.0000 mg | Freq: Four times a day (QID) | ORAL | Status: DC | PRN

## 2024-02-02 MED ORDER — INSULIN ASPART 100 UNIT/ML IJ SOLN
0.0000 [IU] | Freq: Three times a day (TID) | INTRAMUSCULAR | Status: DC
  Administered 2024-02-03: 7 [IU] via SUBCUTANEOUS
  Administered 2024-02-03 (×2): 2 [IU] via SUBCUTANEOUS
  Administered 2024-02-04: 1 [IU] via SUBCUTANEOUS
  Filled 2024-02-02: qty 2
  Filled 2024-02-02: qty 1
  Filled 2024-02-02: qty 7

## 2024-02-02 MED ORDER — METOPROLOL TARTRATE 5 MG/5ML IV SOLN
5.0000 mg | Freq: Once | INTRAVENOUS | Status: AC
  Administered 2024-02-02: 5 mg via INTRAVENOUS
  Filled 2024-02-02: qty 5

## 2024-02-02 NOTE — ED Provider Notes (Addendum)
 " [place] AT [facility] Provider Note   CSN: [id number] Arrival date & time: 02/02/24  9045     Patient presents with: Head Injury   Michael Bender is a 79 y.o. male.   HPI 79 year old male history of hypertension, peripheral artery disease, stroke, anemia, hyperlipidemia, COPD, opioid dependence, chronic heart failure CKD, aortic regurg, diabetes, CKD, history of aortic valve replacement, type 2 diabetes presents today after fall.  He states that he hit his head on the wood fireplace.  He is not clear what happened and is unclear whether he had a mechanical fall or passed out.  He has a large laceration to the right side of his head.  He does not know when his last tetanus shot was.  He denies any other injuries.     Prior to Admission medications  Medication Sig Start Date End Date Taking? Authorizing Provider  albuterol  (VENTOLIN  HFA) 108 (90 Base) MCG/ACT inhaler Inhale 2 puffs into the lungs every 6 (six) hours as needed for wheezing or shortness of breath. 12/14/23  Yes [name], MD  amitriptyline  (ELAVIL ) 50 MG tablet Take 50 mg by mouth at bedtime. 01/27/24  Yes [provider]  bisacodyl  5 MG EC tablet Take 1 tablet (5 mg total) by mouth daily as needed for moderate constipation. 10/25/23  Yes [name], MD  Doxepin  HCl 6 MG TABS Take 1 tablet (6 mg total) by mouth at bedtime. 01/19/24  Yes [name], MD  empagliflozin  (JARDIANCE ) 25 MG TABS tablet Take 1 tablet (25 mg total) by mouth daily. 12/11/23  Yes Fountain, Madison L, NP  ENTRESTO  24-26 MG Take 1 tablet by mouth 2 (two) times daily. 11/29/23  Yes [provider]  furosemide  (LASIX ) 40 MG tablet Take 1 tablet (40 mg total) by mouth daily. Patient taking differently: Take 40 mg by mouth daily. TAKE 40 MG (1 TABLET) AS NEEDED FOR WEIGHT GAIN OF 3 POUNDS IN ONE NIGHT OR 5 POUNDS IN 1 WEEK. 12/11/23  Yes Fountain, Madison L, NP  ipratropium-albuterol  (DUONEB)  0.5-2.5 (3) MG/3ML SOLN Take 3 mLs by nebulization every 4 (four) hours as needed. 04/19/23  Yes [name], MD  Oxycodone  HCl 10 MG TABS Take 1 tablet (10 mg total) by mouth every 8 (eight) hours as needed. 01/18/24  Yes [name], MD  pantoprazole  (PROTONIX ) 40 MG tablet Take 1 tablet (40 mg total) by mouth daily. 05/03/23  Yes [name], MD  polyethylene glycol (MIRALAX  / GLYCOLAX ) 17 g packet Take 17 g by mouth daily as needed for moderate constipation. 11/02/22  Yes Cheryle Page, MD  ramelteon  (ROZEREM ) 8 MG tablet Take 1 tablet (8 mg total) by mouth at bedtime. 01/18/24  Yes [name], MD  rosuvastatin  (CRESTOR ) 40 MG tablet TAKE 1 TABLET BY MOUTH DAILY. REPLACES ATORVASTATIN  (STOP ATORVASTATIN  IF STILL TAKING) 04/13/23  Yes [name], MD  SUMAtriptan  (IMITREX ) 50 MG tablet TAKE 1 TABLET (50 MG TOTAL) BY MOUTH DAILY. MAY REPEAT IN 2 HOURS IF HEADACHE PERSISTS OR RECURS. 11/18/22  Yes [name], MD  vitamin D3 (CHOLECALCIFEROL ) 25 MCG tablet Take 4 tablets (4,000 Units total) by mouth daily. 11/22/23  Yes [name], NP  ACCU-CHEK GUIDE TEST test strip 1 each by Other route 4 (four) times daily. 10/12/23   [provider]  aspirin  EC 81 MG tablet Take 1 tablet (81 mg total) by mouth daily. Swallow whole. 02/05/24  Sigdel, Santosh, MD  B Complex-C (B-COMPLEX WITH VITAMIN C) tablet Take 1 tablet by mouth daily. Patient not taking: Reported on 02/06/2024 11/21/23   [name], NP  Blood Glucose Monitoring Suppl (ACCU-CHEK GUIDE ME) w/Device KIT Inject 1 Application into the skin 4 (four) times daily. 10/12/23   [provider]  Blood Glucose Monitoring Suppl DEVI 1 each by Does not apply route in the morning, at noon, and at bedtime. May substitute to any manufacturer covered by patient's insurance. 10/12/23   [name], MD  clopidogrel  (PLAVIX ) 75 MG tablet Take 1 tablet (75 mg total) by mouth daily. 02/13/24   [name],  MD  Continuous Glucose Sensor (FREESTYLE LIBRE 3 PLUS SENSOR) MISC Change sensor every 15 days. Patient not taking: Reported on 02/06/2024 10/12/23   [name], MD  Dulaglutide  (TRULICITY ) 0.75 MG/0.5ML SOAJ Inject 0.75 mg into the skin once a week. Replaces ozempic  Patient not taking: Reported on 02/06/2024 11/27/23   [name], MD  Lemborexant  (DAYVIGO ) 5 MG TABS Take 1 tablet (5 mg total) by mouth at bedtime. 01/19/24   [name], MD  levETIRAcetam  (KEPPRA ) 500 MG tablet Take 1 tablet (500 mg total) by mouth 2 (two) times daily. Patient not taking: Reported on 02/06/2024 11/15/23   [name], PA-C  magnesium  gluconate (MAGONATE) 500 (27 Mg) MG TABS tablet Take 0.5 tablets (250 mg total) by mouth at bedtime. Patient not taking: Reported on 02/06/2024 11/20/23   [name], NP  metoprolol  tartrate (LOPRESSOR ) 50 MG tablet Take 1 tablet (50 mg total) by mouth 2 (two) times daily. Patient not taking: Reported on 02/06/2024 11/15/23   Zimmerman, [name], PA-C  Multiple Vitamin (MULTIVITAMIN WITH MINERALS) TABS tablet Take 1 tablet by mouth daily. Patient not taking: Reported on 02/06/2024 11/16/23   [name], PA-C  Respiratory Therapy Supplies (NEBULIZER/TUBING/MOUTHPIECE) KIT 1 each by Does not apply route every 4 (four) hours as needed. 04/19/23   [name], MD  spironolactone  (ALDACTONE ) 25 MG tablet Take 25 mg by mouth 2 (two) times daily. Patient not taking: Reported on 02/06/2024 01/27/24   [provider]    Allergies: Nubain [nalbuphine hcl]    Review of Systems  Updated Vital Signs BP 128/73 (BP Location: Left Arm)   Pulse (!) 59   Temp 98 F (36.7 C) (Oral)   Resp 18   SpO2 100%   Physical Exam  (all labs ordered are listed, but only abnormal results are displayed) Labs Reviewed  CBC WITH DIFFERENTIAL/PLATELET - Abnormal; Notable for the following components:      Result Value   RBC 3.42 (*)    Hemoglobin 10.4 (*)     HCT 31.1 (*)    Platelets 149 (*)    All other components within normal limits  COMPREHENSIVE METABOLIC PANEL WITH GFR - Abnormal; Notable for the following components:   Glucose, Bld 469 (*)    Creatinine, Ser 1.40 (*)    GFR, Estimated 51 (*)    All other components within normal limits  URINALYSIS, ROUTINE W REFLEX MICROSCOPIC - Abnormal; Notable for the following components:   Color, Urine STRAW (*)    Glucose, UA >=500 (*)    Hgb urine dipstick TRACE (*)    Protein, ur 100 (*)    All other components within normal limits  GLUCOSE, CAPILLARY - Abnormal; Notable for the following components:   Glucose-Capillary 240 (*)    All other components within  normal limits  CBC - Abnormal; Notable for the following components:   WBC 10.8 (*)    RBC 3.70 (*)    Hemoglobin 11.5 (*)    HCT 34.0 (*)    All other components within normal limits  BASIC METABOLIC PANEL WITH GFR - Abnormal; Notable for the following components:   Potassium 3.4 (*)    CO2 20 (*)    Glucose, Bld 169 (*)    Creatinine, Ser 1.34 (*)    GFR, Estimated 54 (*)    Anion gap 16 (*)    All other components within normal limits  GLUCOSE, CAPILLARY - Abnormal; Notable for the following components:   Glucose-Capillary 157 (*)    All other components within normal limits  HEMOGLOBIN A1C - Abnormal; Notable for the following components:   Hgb A1c MFr Bld 8.5 (*)    All other components within normal limits  GLUCOSE, CAPILLARY - Abnormal; Notable for the following components:   Glucose-Capillary 304 (*)    All other components within normal limits  GLUCOSE, CAPILLARY - Abnormal; Notable for the following components:   Glucose-Capillary 191 (*)    All other components within normal limits  BASIC METABOLIC PANEL WITH GFR - Abnormal; Notable for the following components:   Glucose, Bld 111 (*)    BUN 29 (*)    Creatinine, Ser 1.53 (*)    Calcium  8.2 (*)    GFR, Estimated 46 (*)    All other components within normal  limits  GLUCOSE, CAPILLARY - Abnormal; Notable for the following components:   Glucose-Capillary 265 (*)    All other components within normal limits  GLUCOSE, CAPILLARY - Abnormal; Notable for the following components:   Glucose-Capillary 145 (*)    All other components within normal limits  TROPONIN T, HIGH SENSITIVITY - Abnormal; Notable for the following components:   Troponin T High Sensitivity 82 (*)    All other components within normal limits  TROPONIN T, HIGH SENSITIVITY - Abnormal; Notable for the following components:   Troponin T High Sensitivity 83 (*)    All other components within normal limits  AMMONIA  URINE DRUG SCREEN  URINALYSIS, MICROSCOPIC (REFLEX)  LIPID PANEL  MAGNESIUM     EKG: EKG Interpretation Date/Time:  Friday February 02 2024 10:07:17 EST Ventricular Rate:  71 PR Interval:  164 QRS Duration:  86 QT Interval:  457 QTC Calculation: 497 R Axis:   72  Text Interpretation: Sinus rhythm Probable LVH with secondary repol abnrm Borderline prolonged QT interval Confirmed by Levander Houston [phone]) on 02/02/2024 11:56:09 AM  Radiology: No results found.    .Critical Care  Performed by: Levander Houston, MD Authorized by: Levander Houston, MD   Critical care provider statement:    Critical care time (minutes):  75   Critical care time was exclusive of:  Separately billable procedures and treating other patients and teaching time   Critical care was necessary to treat or prevent imminent or life-threatening deterioration of the following conditions:  CNS failure or compromise and trauma   Critical care was time spent personally by me on the following activities:  Development of treatment plan with patient or surrogate, discussions with consultants, evaluation of patient's response to treatment, examination of patient, ordering and review of laboratory studies, ordering and review of radiographic studies, ordering and performing treatments and interventions, pulse  oximetry, re-evaluation of patient's condition and review of old charts    Medications Ordered in the ED  lactated ringers  1,000 mL with  potassium chloride  10 mEq infusion ( Intravenous Stopped 02/04/24 1124)  Tdap (ADACEL ) injection 0.5 mL (0.5 mLs Intramuscular Given 02/02/24 1018)  lidocaine -EPINEPHrine  (XYLOCAINE  W/EPI) 2 %-1:200000 (PF) injection 10 mL (10 mLs Infiltration Given 02/02/24 1020)  metoprolol  tartrate (LOPRESSOR ) injection 5 mg (5 mg Intravenous Given 02/02/24 1043)  hydrALAZINE  (APRESOLINE ) injection 20 mg (20 mg Intravenous Given 02/02/24 1044)  LORazepam  (ATIVAN ) injection 0.5 mg (0.5 mg Intravenous Given 02/02/24 1235)  fentaNYL  (SUBLIMAZE ) injection 25 mcg (25 mcg Intravenous Given 02/02/24 1459)  fentaNYL  (SUBLIMAZE ) injection 25 mcg (25 mcg Intravenous Given 02/02/24 1730)   stroke: early stages of recovery book ( Does not apply Given 02/03/24 0900)  iohexol  (OMNIPAQUE ) 350 MG/ML injection 75 mL (75 mLs Intravenous Contrast Given 02/03/24 0939)    Clinical Course as of 02/20/24 1147  Fri Feb 02, 2024  1154 X-Hessie Varone reviewed interpreted and no evidence of acute abnormalities are noted [DR]  1155 CT cervical spine with no acute findings [DR]  1155 CT head with new hypodensity in left occipital lobe compared to prior study likely representing a subacute to chronic infarct [DR]    Clinical Course User Index [DR] Levander Houston, MD                                 Medical Decision Making Amount and/or Complexity of Data Reviewed Labs: ordered. Radiology: ordered.  Risk Prescription drug management. Decision regarding hospitalization.   79 year old man who presents today with injury to his head.  He had fall to the ground with unknown etiology.  Here he has a large laceration to his head.  On his physical exam he has no other trauma noted and no focal neurological deficits noted. Workup is significant for hyperglycemia with glucose of 469 but no evidence of DKA. Creatinine is 1.4  which appears stable for this patient.  No other acute electrolyte abnormalities Ammonia level is normal. Troponin T is elevated at 82. EKG is without acute ischemia.  CT of the head is significant for a new hypodensity in the left occipital lobe which is likely representing a subacute to chronic infarct. 1 fall unknown etiology syncope versus neurological event versus mechanical Head CT without acute bleed and CT of neck without acute injury.  Laceration repaired here in ED. 2 hypertension patient was significantly hypertensive on initial evaluation.  Unclear whether he has been taking his medications at home.  He received hydralazine  and his beta-blocker and blood pressures decreased to 159/76. 3 hyperglycemia patient with history of diabetes.  Again unclear what medications he has been taking.  Patient is initially treated here with IV fluids and will have his blood sugar rechecked 4 elevated troponin in setting of syncope he does raise some concern for cardiac event.  Patient has known coronary artery disease.  Will need to have troponins trended 5 patient with hypodensity in occipital lobe on head CT.  Likely representing a subacute to chronic infarct.  This would be consistent with patient's symptoms although no focal deficits are noted on his physical exam.  He will need further workup for stroke. MRI is ordered here.mri withscattered acute infarcts in pca territory, few additional small acute vs subactue infarcts in post left mca territory    Final diagnoses:  Cerebrovascular accident (CVA), unspecified mechanism (HCC)  Fall, initial encounter  Laceration of other part of head without foreign body, initial encounter  Hyperglycemia  Elevated troponin    ED Discharge Orders  Ordered    aspirin  EC 81 MG tablet  Daily        02/04/24 1051    Increase activity slowly        02/04/24 1051    Discharge instructions  Status:  Canceled       Comments: 1.  Follow-up with neurology  in 2 to 3 weeks. 2.  Follow-up with PCP for routine health management/management of chronic issues like diabetes mellitus and cardiovascular disease.   02/04/24 1051    Diet - low sodium heart healthy        02/04/24 1051    Leave dressing on - Keep it clean, dry, and intact until clinic visit        02/04/24 1051    Ambulatory referral to Neurology       Comments: An appointment is requested in approximately: 2 weeks   02/04/24 1051    Ambulatory referral to Cardiology       Comments: Evaluate for watchman device   02/04/24 1055    clopidogrel  (PLAVIX ) 75 MG tablet  Daily,   Status:  Discontinued        02/04/24 1126    Discharge instructions       Comments: 1.  Follow-up with neurology in 2 to 3 weeks. 2.  Follow-up with PCP for routine health management/management of chronic issues like diabetes mellitus and cardiovascular disease.   02/04/24 1126               Levander Houston, MD 02/02/24 1358    Levander Houston, MD 02/20/24 1147  "

## 2024-02-02 NOTE — H&P (Signed)
 " History and Physical    Michael Bender:[id number] DOB: September 08, 1945 DOA: 02/02/2024  PCP: [name], MD  Patient coming from: [facility] ED  Chief Complaint: Fall  HPI: Michael Bender is a 79 y.o. male with medical history significant of paroxysmal A-fib not on anticoagulation due to history of falls, history of GI bleed due to duodenal ulcer, chronic iron deficiency anemia, hypertension, CAD, severe aortic insufficiency status post bioprosthetic aortic valve replacement on 11/08/2023, HFpEF, PAD, CVA, hyperlipidemia, COPD, CKD stage IIIb, type 2 diabetes, chronic low back pain, opioid dependence, BPH presented to the ED after a fall.  Patient hit his head and sustained a large laceration to his head which was repaired in the ED with 9 sutures.  Blood pressure 248/101 on arrival but remainder of vital signs stable.  Labs notable for hemoglobin 10.4 (close to baseline), platelet count 149k, glucose 469, bicarb 23, anion gap 12, creatinine 1.4 (stable), troponin 82, ammonia level <13, UA without ketones or signs of infection, UDS negative.  Chest x-ray showing no acute cardiopulmonary findings.  CT head showing a new hypodensity in the left occipital lobe suspicious for a subacute to chronic infarct.  CT C-spine negative for acute traumatic injuries.  Brain MRI showing scattered acute infarcts in the left PCA territory primarily involving the cortex and subcortical white matter in the posterior medial left temporal lobe and in the inferior left occipital lobe with mild associated edema and sulcal effacement but no midline shift.  Showing a few additional small acute versus subacute infarcts in the posterior left MCA territory with involvement of the left precentral gyrus.  EKG showing sinus rhythm, QTc 497, mild T wave abnormalities in lateral leads, and no significant change compared to previous tracings.  Patient was given IV fentanyl , IV hydralazine , IV Ativan , IV metoprolol , and Tdap injection in  the ED.  Patient is currently awake and alert, resting comfortably.  He reports falling at home and hitting his head on a stove but does not know how he fell.  He is not sure if he lost consciousness.  Complaining of a headache.  Denies dizziness, chest pain, or shortness of breath.  Patient does not think he has diabetes and is not sure which medications he is taking at home as his son fills his pillbox.  He has no other complaints.  Denies vision changes, difficulty with speech, or weakness of his arm or leg.  Denies cough, nausea, vomiting, abdominal pain, diarrhea, or any urinary symptoms.  Review of Systems:  Review of Systems  All other systems reviewed and are negative.   Past Medical History:  Diagnosis Date   Acute combined systolic and diastolic heart failure (HCC) 04/22/2023   Acute on chronic diastolic CHF (congestive heart failure) (HCC) 04/20/2023   AKI (acute kidney injury) 10/28/2022   Arthritis    Back   Balance problems 02/21/2023   Blood transfusion    as a child   Cachexia 05/03/2022   Added based on temporal wasting noted on exam, Body mass index is 19.92 kg/m. At 05/03/2022 encounter     Cervical discitis 11/10/2022   Chronic back pain    COPD (chronic obstructive pulmonary disease) (HCC)    Disturbance of skin sensation 05/22/2018   Effusion of right knee 02/21/2023   Effusion of right knee joint 01/16/2023   Elevated troponin 04/22/2023   Essential hypertension 03/05/2007   Qualifier: Diagnosis of   By: Krystal MD, Reyes DELENA Home  pain, left 11/08/2022   Gastric erosion    Gastritis and gastroduodenitis    Gastrointestinal hemorrhage with melena 11/20/2018   GIB (gastrointestinal bleeding) 07/18/2019   Hardware complicating wound infection 11/10/2022   Headache(784.0)    last one 6 months ago   Hemorrhoids, internal    High risk medication use 03/03/2022   History of duodenal ulcer    History of fusion of cervical spine 03/03/2022   With persistent  cervical pain and palpable screws  Led to disability  History of attempt to dig furrow in skull to fix it  Last surgery 1992     History of lumbar fusion 03/03/2022   RE-OPERATIVE DIAGNOSIS:  lumbar stenosis synovial cyst lumbar spondylosis spondylolisthesis lumbar radiculoapthy L4/5   PROCEDURE:  Procedure(s): POSTERIOR LUMBAR FUSION 1 LEVEL with resection of synovial cyst     History of upper gastrointestinal bleeding 03/03/2022   Duodenal ulcer 2021 Dr. Avram   HLD (hyperlipidemia)    Hypertension    Hypokalemia 04/22/2023   Hypomagnesemia 04/22/2023   Infected blister of left index finger 11/07/2022   Intractable pain 03/03/2022   Loss of weight    Wt Readings from Last 10 Encounters:  04/05/22  146 lb (66.2 kg)  03/03/22  143 lb 9.6 oz (65.1 kg)  07/14/21  153 lb (69.4 kg)  12/14/20  147 lb 12.8 oz (67 kg)  11/13/19  154 lb (69.9 kg)  09/16/19  146 lb (66.2 kg)  08/06/19  146 lb (66.2 kg)  07/19/19  144 lb 13.5 oz (65.7 kg)  07/17/19  148 lb (67.1 kg)  07/09/19  147 lb 9.6 oz (67 kg)         MSSA bacteremia 11/07/2022   Neck rigidity    post cervical fusion   Nocturia    PAIN, CHRONIC NEC 10/06/2006   Qualifier: Diagnosis of   By: Krystal RN, Leeroy       Pneumonia    Prostate disease    Right sided temporal headache 05/22/2018   S/P cervical spinal fusion 03/03/2022   With persistent cervical pain and palpable screws Led to disability History of attempt to dig furrow in skull to fix it Last surgery 1992   Senile ecchymosis 01/21/2019   Septic arthritis of wrist, left (HCC) 11/08/2022   Septic infrapatellar bursitis of right knee 11/07/2022   Staphylococcal Arthritis of Right Knee (Updated 01/30/2023) MRI 01/17/2023 shows worsening findings:  Worsening tear of posterior horn medial meniscus with large radial component Worsening subcortical stress fracture/osteochondral lesion of medial femoral condyle New subcortical stress fracture/osteochondral lesion of medial tibial  plateau Moderate-to-large effusion with worsened synovitis    Staphylococcal arthritis of left wrist (HCC) 11/07/2022   Staphylococcal arthritis of right knee (HCC) 11/08/2022   Syncope 11/05/2022   Underweight on examination 05/03/2022    Past Surgical History:  Procedure Laterality Date   AORTIC VALVE REPLACEMENT N/A 11/08/2023   Procedure: AORTIC VALVE REPLACEMENT USING A INSPIRIS VALVE.;  Surgeon: [name], MD;  Location: [facility] OR;  Service: Open Heart Surgery;  Laterality: N/A;   BIOPSY  07/19/2019   Procedure: BIOPSY;  Surgeon: San Sandor GAILS, DO;  Location: [facility];  Service: Gastroenterology;;   CERVICAL FUSION  1992   C2/C 3  four surgeries   CLIPPING OF ATRIAL APPENDAGE N/A 11/08/2023   Procedure: CLIPPING OF LEFT ATRIAL APPENDAGE USING A CLIP;  Surgeon: [name], MD;  Location: [facility] OR;  Service: Open Heart Surgery;  Laterality: N/A;  COLONOSCOPY     ESOPHAGOGASTRODUODENOSCOPY  07/20/2011   Procedure: ESOPHAGOGASTRODUODENOSCOPY (EGD);  Surgeon: Lupita FORBES Commander, MD;  Location: [facility];  Service: Endoscopy;  Laterality: N/A;   ESOPHAGOGASTRODUODENOSCOPY (EGD) WITH PROPOFOL  N/A 07/19/2019   Procedure: ESOPHAGOGASTRODUODENOSCOPY (EGD) WITH PROPOFOL ;  Surgeon: San Sandor GAILS, DO;  Location: [facility];  Service: Gastroenterology;  Laterality: N/A;   I & D EXTREMITY Left 11/09/2022   Procedure: IRRIGATION AND DEBRIDEMENT LEFT WRIST;  Surgeon: Arlinda Buster, MD;  Location: [facility] OR;  Service: Orthopedics;  Laterality: Left;   INTRAOPERATIVE TRANSESOPHAGEAL ECHOCARDIOGRAM N/A 11/08/2023   Procedure: ECHOCARDIOGRAM, TRANSESOPHAGEAL, INTRAOPERATIVE;  Surgeon: [name], MD;  Location: [facility] OR;  Service: Open Heart Surgery;  Laterality: N/A;   RIGHT/LEFT HEART CATH AND CORONARY ANGIOGRAPHY N/A 11/06/2023   Procedure: RIGHT/LEFT HEART CATH AND CORONARY ANGIOGRAPHY;  Surgeon: Wonda Sharper, MD;  Location: [facility] INVASIVE CV LAB;  Service: Cardiovascular;   Laterality: N/A;   SAVORY DILATION  07/20/2011   Procedure: SAVORY DILATION;  Surgeon: Lupita FORBES Commander, MD;  Location: [facility];  Service: Endoscopy;  Laterality: N/A;   SPINAL FUSION  05/06/11   TRANSESOPHAGEAL ECHOCARDIOGRAM (CATH LAB) N/A 07/24/2023   Procedure: TRANSESOPHAGEAL ECHOCARDIOGRAM;  Surgeon: Pietro Redell RAMAN, MD;  Location: [facility] INVASIVE CV LAB;  Service: Cardiovascular;  Laterality: N/A;     reports that he quit smoking about 5 years ago. His smoking use included cigarettes. He started smoking about 45 years ago. He has never used smokeless tobacco. He reports that he does not currently use alcohol  after a past usage of about 2.0 standard drinks of alcohol  per week. He reports that he does not use drugs.  Allergies[1]  Family History  Problem Relation Age of Onset   Heart disease Mother    Heart attack Mother 80   Pancreatic cancer Father 2   Pancreatic cancer Brother    Anesthesia problems Neg Hx    Colon cancer Neg Hx    Liver cancer Neg Hx    Stomach cancer Neg Hx    Esophageal cancer Neg Hx    Rectal cancer Neg Hx     Prior to Admission medications  Medication Sig Start Date End Date Taking? Authorizing Provider  albuterol  (VENTOLIN  HFA) 108 (90 Base) MCG/ACT inhaler Inhale 2 puffs into the lungs every 6 (six) hours as needed for wheezing or shortness of breath. 12/14/23  Yes [name], MD  amitriptyline  (ELAVIL ) 50 MG tablet Take 50 mg by mouth at bedtime. 01/27/24  Yes [provider]  bisacodyl  5 MG EC tablet Take 1 tablet (5 mg total) by mouth daily as needed for moderate constipation. 10/25/23  Yes [name], MD  Doxepin  HCl 6 MG TABS Take 1 tablet (6 mg total) by mouth at bedtime. 01/19/24  Yes [name], MD  empagliflozin  (JARDIANCE ) 25 MG TABS tablet Take 1 tablet (25 mg total) by mouth daily. 12/11/23  Yes Fountain, Madison L, NP  ENTRESTO  24-26 MG Take 1 tablet by mouth 2 (two) times daily. 11/29/23  Yes [provider]   [Paused] furosemide  (LASIX ) 40 MG tablet Take 1 tablet (40 mg total) by mouth daily. Patient taking differently: Take 40 mg by mouth daily. TAKE 40 MG (1 TABLET) AS NEEDED FOR WEIGHT GAIN OF 3 POUNDS IN ONE NIGHT OR 5 POUNDS IN 1 WEEK. Wait to take this until your doctor or other care provider tells you to start again. 12/11/23  Yes Fountain, Madison L, NP  ipratropium-albuterol  (DUONEB) 0.5-2.5 (3) MG/3ML  SOLN Take 3 mLs by nebulization every 4 (four) hours as needed. 04/19/23  Yes [name], MD  Oxycodone  HCl 10 MG TABS Take 1 tablet (10 mg total) by mouth every 8 (eight) hours as needed. 01/18/24  Yes [name], MD  pantoprazole  (PROTONIX ) 40 MG tablet Take 1 tablet (40 mg total) by mouth daily. 05/03/23  Yes [name], MD  polyethylene glycol (MIRALAX  / GLYCOLAX ) 17 g packet Take 17 g by mouth daily as needed for moderate constipation. 11/02/22  Yes Cheryle Page, MD  ramelteon  (ROZEREM ) 8 MG tablet Take 1 tablet (8 mg total) by mouth at bedtime. 01/18/24  Yes [name], MD  rosuvastatin  (CRESTOR ) 40 MG tablet TAKE 1 TABLET BY MOUTH DAILY. REPLACES ATORVASTATIN  (STOP ATORVASTATIN  IF STILL TAKING) 04/13/23  Yes [name], MD  SUMAtriptan  (IMITREX ) 50 MG tablet TAKE 1 TABLET (50 MG TOTAL) BY MOUTH DAILY. MAY REPEAT IN 2 HOURS IF HEADACHE PERSISTS OR RECURS. 11/18/22  Yes [name], MD  vitamin D3 (CHOLECALCIFEROL ) 25 MCG tablet Take 4 tablets (4,000 Units total) by mouth daily. 11/22/23  Yes [name], NP  ACCU-CHEK GUIDE TEST test strip 1 each by Other route 4 (four) times daily. 10/12/23   [provider]  aspirin  EC 81 MG tablet Take 1 tablet (81 mg total) by mouth daily. Swallow whole. Patient not taking: Reported on 02/02/2024 11/21/23   [name], NP  B Complex-C (B-COMPLEX WITH VITAMIN C) tablet Take 1 tablet by mouth daily. Patient not taking: Reported on 02/02/2024 11/21/23   [name], NP  Blood Glucose Monitoring  Suppl (ACCU-CHEK GUIDE ME) w/Device KIT Inject 1 Application into the skin 4 (four) times daily. 10/12/23   [provider]  Blood Glucose Monitoring Suppl DEVI 1 each by Does not apply route in the morning, at noon, and at bedtime. May substitute to any manufacturer covered by patient's insurance. 10/12/23   [name], MD  Continuous Glucose Sensor (FREESTYLE LIBRE 3 PLUS SENSOR) MISC Change sensor every 15 days. Patient not taking: Reported on 02/02/2024 10/12/23   [name], MD  Dulaglutide  (TRULICITY ) 0.75 MG/0.5ML SOAJ Inject 0.75 mg into the skin once a week. Replaces ozempic  Patient not taking: Reported on 02/02/2024 11/27/23   [name], MD  Lemborexant  (DAYVIGO ) 5 MG TABS Take 1 tablet (5 mg total) by mouth at bedtime. Ok to fill just 1 for trial Patient not taking: Reported on 02/02/2024 01/19/24   [name], MD  Lemborexant  (DAYVIGO ) 5 MG TABS Take 1 tablet (5 mg total) by mouth at bedtime. 01/19/24   [name], MD  levETIRAcetam  (KEPPRA ) 500 MG tablet Take 1 tablet (500 mg total) by mouth 2 (two) times daily. Patient not taking: Reported on 02/02/2024 11/15/23   [name], PA-C  magnesium  gluconate (MAGONATE) 500 (27 Mg) MG TABS tablet Take 0.5 tablets (250 mg total) by mouth at bedtime. Patient not taking: Reported on 02/02/2024 11/20/23   [name], NP  metoprolol  tartrate (LOPRESSOR ) 50 MG tablet Take 1 tablet (50 mg total) by mouth 2 (two) times daily. Patient not taking: Reported on 02/02/2024 11/15/23   Zimmerman, [name], PA-C  Multiple Vitamin (MULTIVITAMIN WITH MINERALS) TABS tablet Take 1 tablet by mouth daily. Patient not taking: Reported on 02/02/2024 11/16/23   [name], PA-C  Respiratory Therapy Supplies (NEBULIZER/TUBING/MOUTHPIECE) KIT 1 each by Does not apply route every 4 (four) hours as needed. 04/19/23  [name], MD  spironolactone  (ALDACTONE ) 25 MG tablet Take 25 mg by mouth 2 (two) times  daily. Patient not taking: Reported on 02/02/2024 01/27/24   [provider]  traZODone  (DESYREL ) 100 MG tablet Take 1 tablet (100 mg total) by mouth at bedtime. Patient not taking: Reported on 02/02/2024 11/20/23   [name], NP    Physical Exam: Vitals:   02/02/24 1730 02/02/24 1800 02/02/24 1830 02/02/24 1948  BP: (!) 168/104 (!) 184/88 (!) 180/84 (!) 194/88  Pulse: 76 72  73  Resp: (!) 21 16 12 18   Temp:    97.9 F (36.6 C)  TempSrc:    Axillary  SpO2:    100%    Physical Exam Vitals reviewed.  Constitutional:      General: He is not in acute distress. HENT:     Head: Normocephalic.     Comments: Bandage dressing in place on forehead with no visible bleeding Eyes:     Extraocular Movements: Extraocular movements intact.  Cardiovascular:     Rate and Rhythm: Normal rate and regular rhythm.     Heart sounds: Normal heart sounds.  Pulmonary:     Effort: Pulmonary effort is normal. No respiratory distress.     Breath sounds: Normal breath sounds.  Abdominal:     General: Bowel sounds are normal.     Palpations: Abdomen is soft.     Tenderness: There is no abdominal tenderness. There is no guarding.  Musculoskeletal:     Right lower leg: No edema.     Left lower leg: No edema.  Skin:    General: Skin is warm and dry.  Neurological:     General: No focal deficit present.     Mental Status: He is alert and oriented to person, place, and time.     Cranial Nerves: No cranial nerve deficit.     Sensory: No sensory deficit.     Motor: No weakness.     Labs on Admission: I have personally reviewed following labs and imaging studies  CBC: Recent Labs  Lab 02/02/24 1042  WBC 7.5  NEUTROABS 5.4  HGB 10.4*  HCT 31.1*  MCV 90.9  PLT 149*   Basic Metabolic Panel: Recent Labs  Lab 02/02/24 1042  NA 135  K 3.8  CL 100  CO2 23  GLUCOSE 469*  BUN 20  CREATININE 1.40*  CALCIUM  9.2   GFR: CrCl cannot be calculated (Unknown ideal weight.). Liver  Function Tests: Recent Labs  Lab 02/02/24 1042  AST 29  ALT 11  ALKPHOS 101  BILITOT 0.4  PROT 7.1  ALBUMIN  3.9   No results for input(s): LIPASE, AMYLASE in the last 168 hours. Recent Labs  Lab 02/02/24 1042  AMMONIA <13   Coagulation Profile: No results for input(s): INR, PROTIME in the last 168 hours. Cardiac Enzymes: No results for input(s): CKTOTAL, CKMB, CKMBINDEX, TROPONINI in the last 168 hours. BNP (last 3 results) Recent Labs    11/01/23 1109 12/04/23 1222 01/18/24 0000  PROBNP 634.0* 4,915* 7,929*   HbA1C: No results for input(s): HGBA1C in the last 72 hours. CBG: No results for input(s): GLUCAP in the last 168 hours. Lipid Profile: No results for input(s): CHOL, HDL, LDLCALC, TRIG, CHOLHDL, LDLDIRECT in the last 72 hours. Thyroid  Function Tests: No results for input(s): TSH, T4TOTAL, FREET4, T3FREE, THYROIDAB in the last 72 hours. Anemia Panel: No results for input(s): VITAMINB12, FOLATE, FERRITIN, TIBC, IRON, RETICCTPCT in the last 72 hours. Urine analysis:  Component Value Date/Time   COLORURINE STRAW (A) 02/02/2024 1147   APPEARANCEUR CLEAR 02/02/2024 1147   LABSPEC 1.010 02/02/2024 1147   PHURINE 6.5 02/02/2024 1147   GLUCOSEU >=500 (A) 02/02/2024 1147   HGBUR TRACE (A) 02/02/2024 1147   BILIRUBINUR NEGATIVE 02/02/2024 1147   BILIRUBINUR n 09/08/2015 1402   KETONESUR NEGATIVE 02/02/2024 1147   PROTEINUR 100 (A) 02/02/2024 1147   UROBILINOGEN 0.2 09/08/2015 1402   NITRITE NEGATIVE 02/02/2024 1147   LEUKOCYTESUR NEGATIVE 02/02/2024 1147    Radiological Exams on Admission: MR BRAIN WO CONTRAST Result Date: 02/02/2024 EXAM: MRI BRAIN WITHOUT CONTRAST 02/02/2024 01:26:02 PM TECHNIQUE: Multiplanar multisequence MRI of the head/brain was performed without the administration of intravenous contrast. COMPARISON: Same day CT head and MRI head 11/10/2023. CLINICAL HISTORY: Neuro deficit, acute,  stroke suspected. FINDINGS: BRAIN AND VENTRICLES: Moderate to severe chronic microvascular ischemic changes. Similar appearance of generalized parenchymal volume loss. Remote infarcts involving the left basal ganglia and corona radiata as well as the left thalamus. There are scattered areas of restricted diffusion in the left PCA territory primarily involving the cortex and subcortical white matter in the posteromedial left temporal lobe and in the inferior left occipital lobe. There is mild associated edema and sulcal effacement in this region. Few additional small acute versus subacute infarcts in the posterior left MCA territory with involvement of the left precentral gyrus. Small remote cortical infarcts in the bilateral frontal lobes. Remote infarcts in the right cerebellum. Susceptibility in the left basal ganglia suggestive of remote hemorrhage. No mass. No midline shift. No hydrocephalus. The sella is unremarkable. Normal flow voids. ORBITS: Bilateral lens replacement. No acute abnormality. SINUSES AND MASTOIDS: Mucosal thickening in the left maxillary sinus. No acute abnormality. BONES AND SOFT TISSUES: Normal marrow signal. Right frontal scalp hematoma. Posterior fusion hardware in the upper cervical spine. IMPRESSION: 1. Scattered acute infarcts in the left PCA territory, primarily involving the cortex and subcortical white matter in the posteromedial left temporal lobe and in the inferior left occipital lobe. Mild associated edema and sulcal effacement. No midline shift. 2. Few additional small acute versus subacute infarcts in the posterior left MCA territory with involvement of the left precentral gyrus. 3. Moderate to severe chronic microvascular ischemic changes and generalized parenchymal volume loss. 4. Remote infarcts as above. Electronically signed by: Donnice Mania MD 02/02/2024 01:49 PM EST RP Workstation: [id number]   DG Chest Port 1 View Result Date: 02/02/2024 EXAM: 1 VIEW(S) XRAY OF THE  CHEST 02/02/2024 10:24:20 AM COMPARISON: 12/14/2023 CLINICAL HISTORY: syncope vs fall FINDINGS: LUNGS AND PLEURA: No focal pulmonary opacity. No pleural effusion. No pneumothorax. HEART AND MEDIASTINUM: Cardiac valve replacement and left atrial appendage clip noted. Aortic arch calcifications. Post-surgical changes of mediastinum. BONES AND SOFT TISSUES: Sternotomy wires intact. No acute osseous abnormality. IMPRESSION: 1. No acute cardiopulmonary findings. Postoperative changes of cardiac valve replacement and left atrial appendage clip with intact sternotomy wires. Electronically signed by: Evalene Coho MD 02/02/2024 10:49 AM EST RP Workstation: [id number]   CT Cervical Spine Wo Contrast Result Date: 02/02/2024 EXAM: CT CERVICAL SPINE WITHOUT CONTRAST 02/02/2024 10:30:13 AM TECHNIQUE: CT of the cervical spine was performed without the administration of intravenous contrast. Multiplanar reformatted images are provided for review. Automated exposure control, iterative reconstruction, and/or weight based adjustment of the mA/kV was utilized to reduce the radiation dose to as low as reasonably achievable. COMPARISON: CT of the cervical spine dated 02/17/2023. CLINICAL HISTORY: Neck trauma (Age >= 65y). FINDINGS: BONES AND ALIGNMENT: No acute fracture or  traumatic malalignment. The patient is status post bilateral posterolateral spinal fusion at C2-C3. DEGENERATIVE CHANGES: Slight degenerative anterolisthesis at C4-C5. Moderate chronic degenerative disc disease at C5-C6 with endplate ridging resulting in mild central spinal canal stenosis and bilateral neural foraminal stenosis. SOFT TISSUES: No prevertebral soft tissue swelling. There are calcifications within the carotid bulbs and carotid arteries bilaterally. LUNGS: Mild emphysematous changes are noted in the lung apices. IMPRESSION: 1. No acute findings. 2. Moderate chronic degenerative disc disease at C5-6 with endplate ridging resulting in mild central  spinal canal stenosis and bilateral neural foraminal stenosis. 3. Slight degenerative anterolisthesis at C4-5. 4. Status post bilateral posterolateral spinal fusion at C2-3. 5. Bilateral carotid artery atherosclerotic calcifications. 6. Mild emphysematous changes in the lung apices. Pulmonary emphysema is an independent risk factor for lung cancer. Recommend consideration for evaluation for a low-dose CT lung cancer screening program. Electronically signed by: Evalene Coho MD 02/02/2024 10:48 AM EST RP Workstation: [id number]   CT Head Wo Contrast Result Date: 02/02/2024 EXAM: CT HEAD WITHOUT CONTRAST 02/02/2024 10:30:13 AM TECHNIQUE: CT of the head was performed without the administration of intravenous contrast. Automated exposure control, iterative reconstruction, and/or weight based adjustment of the mA/kV was utilized to reduce the radiation dose to as low as reasonably achievable. COMPARISON: 11/09/2023 CLINICAL HISTORY: Head trauma, minor (Age >= 65y). FINDINGS: BRAIN AND VENTRICLES: No acute hemorrhage. New hypodensity in the left occipital lobe compared to prior study. Chronic infarcts in the left basal ganglia and the left thalamus. Moderate chronic microvascular ischemic change, unchanged from prior exam. No hydrocephalus. No extra-axial collection. No mass effect or midline shift. ORBITS: Bilateral lens replacement noted. SINUSES: Chronic appearing opacification of the right sphenoid sinus. SOFT TISSUES AND SKULL: Soft tissue swelling and laceration of the right forehead. No skull fracture. IMPRESSION: 1. New hypodensity in the left occipital lobe compared to prior study, likely representing a subacute to chronic infarct. 2. Chronic infarcts in the left basal ganglia and the left thalamus. 3. Moderate chronic microvascular ischemic change, unchanged from prior exam. 4. Soft tissue swelling and laceration of the right forehead. 5. Chronic appearing opacification of the right sphenoid sinus.  Electronically signed by: Evalene Coho MD 02/02/2024 10:45 AM EST RP Workstation: [id number]    Assessment and Plan  Acute CVA Patient presenting after a fall, unclear whether this was a mechanical fall versus fall related to syncope.  No focal neurodeficit on exam.  He has history of previous stroke and multiple risk factors including paroxysmal A-fib for which he is not anticoagulated due to history of falls and previous GI bleed.  Brain MRI showing scattered acute infarcts in the left PCA territory primarily involving the cortex and subcortical white matter in the posterior medial left temporal lobe and in the inferior left occipital lobe with mild associated edema and sulcal effacement but no midline shift.  Also showing a few additional small acute versus subacute infarcts in the posterior left MCA territory with involvement of the left precentral gyrus.  Neurology consulted on arrival to [facility] and will give recommendations.  Echocardiogram ordered for evaluation of stroke and possible syncope/history of aortic valve replacement.  PT/OT/SLP, fall precautions.  Hemoglobin A1c 7.7 on 11/12/2023.  Check lipid panel.  Elevated troponin CAD Troponin 82 and EKG without acute ischemic changes.  Patient is not endorsing chest pain.  Cardiac cath done 11/06/2023 showing patent coronary arteries with mild diffuse calcific plaquing but no significant stenosis.  Trend troponin and echocardiogram ordered as above.  Large  forehead laceration Secondary to fall.  Repaired in the ED with 9 sutures.  Patient was given Tdap injection in the ED.  Continue wound care.  Hyperglycemia Type 2 diabetes Last hemoglobin A1c 7.7 on 11/12/2023.  Glucose in the 400s on initial labs without signs of DKA.  Most recent CBG 240.  Placed on sensitive sliding scale insulin  ACHS.  Hypertension Blood pressure initially 248/101 and patient was given IV hydralazine  and IV metoprolol  in the ED.  SBP now in the  190s.  Given MRI findings concerning for acute stroke, will allow permissive hypertension up to 220/120 at this time unless if neurology has different recommendations.  QT prolongation QTc 497 on EKG.  Monitor potassium and magnesium  levels.  Avoid QT prolonging drugs if possible.  Chronic HFpEF Last echo done 12/26/2023 showing EF 60 to 65%, grade 1 diastolic dysfunction, trivial mitral regurgitation, aortic valve replacement with trivial regurgitation, and borderline dilation of the ascending aorta.  No signs of volume overload at this time.  Paroxysmal A-fib He is not on chronic anticoagulation due to history of falls and previous GI bleed.  Currently in sinus rhythm.  Chronic anemia Hemoglobin close to baseline, monitor H&H.  PAD Hyperlipidemia Continue Crestor .  COPD Stable, no signs of acute exacerbation.  Patient tells me he does not use any inhalers at home.  DuoNeb PRN.  CKD stage IIIb Creatinine stable.  Chronic low back pain Opiate dependence Continue home oxycodone  10 mg every 8 hours PRN.  DVT prophylaxis: SCDs Code Status: Full Code (discussed with the patient) Family Communication: No family available at this time. Consults called: Neurology Level of care: Telemetry bed Admission status: It is my clinical opinion that referral for OBSERVATION is reasonable and necessary in this patient based on the above information provided. The aforementioned taken together are felt to place the patient at high risk for further clinical deterioration. However, it is anticipated that the patient may be medically stable for discharge from the hospital within 24 to 48 hours.  Editha Ram MD [facility]  If 7PM-7AM, please contact night-coverage [email or web address]  02/02/2024, 7:55 PM       [1]  Allergies Allergen Reactions   Nubain [Nalbuphine Hcl]     Muscle contraction   "

## 2024-02-02 NOTE — Consult Note (Signed)
 NEUROLOGY CONSULT NOTE   Date of service: February 02, 2024 Patient Name: Michael Bender MRN:  [id number] DOB:  [birth year] Chief Complaint: strokes on MRI Requesting Provider: Alfornia Madison, MD  History of Present Illness  OLA FAWVER is a 79 y.o. male with hx of Afibb but not on AC due to multiple falls and GI bleed who presents to the ED with a fall. Pt reports, he is not sure how he fell. He might have tripped over something. Hit his head on the wood stove. He was brought in to the ED with a large laceration on his Right forehead. He had CT Head w/o contrast which was concerning for a new L Occipital lobe hypodensity. MRI Brain demonstrated scattered acute infarcts in the left PCA territory, primarily involving the cortex and subcortical white matter in the posteromedial left temporal lobe and in the inferior left occipital lobe. Mild associated edema and sulcal effacement. No midline shift. Few additional small acute versus subacute infarcts in the posterior left MCA territory with involvement of the left precentral gyrus.  He was also significantly hyperglycemic on arrival.  Patient admitted to [facility] and neurology consulted for further evaluation and workup for noted strokes.  LKW: unclear Modified rankin score: 3-Moderate disability-requires help but walks WITHOUT assistance IV Thrombolysis: not offered, unclear LKW EVT: not offered, unclear Lkw  NIHSS components Score: Comment  1a Level of Conscious 0[x]  1[]  2[]  3[]      1b LOC Questions 0[]  1[]  2[x]       1c LOC Commands 0[x]  1[]  2[]       2 Best Gaze 0[x]  1[]  2[]       3 Visual 0[x]  1[]  2[]  3[]      4 Facial Palsy 0[x]  1[]  2[]  3[]      5a Motor Arm - left 0[x]  1[]  2[]  3[]  4[]  UN[]    5b Motor Arm - Right 0[x]  1[]  2[]  3[]  4[]  UN[]    6a Motor Leg - Left 0[x]  1[]  2[]  3[]  4[]  UN[]    6b Motor Leg - Right 0[x]  1[]  2[]  3[]  4[]  UN[]    7 Limb Ataxia 0[]  1[]  2[x]  UN[]      8 Sensory 0[x]  1[]  2[]  UN[]      9 Best Language 0[x]   1[]  2[]  3[]      10 Dysarthria 0[x]  1[]  2[]  UN[]      11 Extinct. and Inattention 0[x]  1[]  2[]       TOTAL: 2      ROS  Comprehensive ROS performed and pertinent positives documented in HPI   Past History   Past Medical History:  Diagnosis Date   Acute combined systolic and diastolic heart failure (HCC) 04/22/2023   Acute on chronic diastolic CHF (congestive heart failure) (HCC) 04/20/2023   AKI (acute kidney injury) 10/28/2022   Arthritis    Back   Balance problems 02/21/2023   Blood transfusion    as a child   Cachexia 05/03/2022   Added based on temporal wasting noted on exam, Body mass index is 19.92 kg/m. At 05/03/2022 encounter     Cervical discitis 11/10/2022   Chronic back pain    COPD (chronic obstructive pulmonary disease) (HCC)    Disturbance of skin sensation 05/22/2018   Effusion of right knee 02/21/2023   Effusion of right knee joint 01/16/2023   Elevated troponin 04/22/2023   Essential hypertension 03/05/2007   Qualifier: Diagnosis of   By: Krystal MD, Reyes LABOR      Finger pain, left 11/08/2022   Gastric erosion    Gastritis and  gastroduodenitis    Gastrointestinal hemorrhage with melena 11/20/2018   GIB (gastrointestinal bleeding) 07/18/2019   Hardware complicating wound infection 11/10/2022   Headache(784.0)    last one 6 months ago   Hemorrhoids, internal    High risk medication use 03/03/2022   History of duodenal ulcer    History of fusion of cervical spine 03/03/2022   With persistent cervical pain and palpable screws  Led to disability  History of attempt to dig furrow in skull to fix it  Last surgery 1992     History of lumbar fusion 03/03/2022   RE-OPERATIVE DIAGNOSIS:  lumbar stenosis synovial cyst lumbar spondylosis spondylolisthesis lumbar radiculoapthy L4/5   PROCEDURE:  Procedure(s): POSTERIOR LUMBAR FUSION 1 LEVEL with resection of synovial cyst     History of upper gastrointestinal bleeding 03/03/2022   Duodenal ulcer 2021 Dr. Avram    HLD (hyperlipidemia)    Hypertension    Hypokalemia 04/22/2023   Hypomagnesemia 04/22/2023   Infected blister of left index finger 11/07/2022   Intractable pain 03/03/2022   Loss of weight    Wt Readings from Last 10 Encounters:  04/05/22  146 lb (66.2 kg)  03/03/22  143 lb 9.6 oz (65.1 kg)  07/14/21  153 lb (69.4 kg)  12/14/20  147 lb 12.8 oz (67 kg)  11/13/19  154 lb (69.9 kg)  09/16/19  146 lb (66.2 kg)  08/06/19  146 lb (66.2 kg)  07/19/19  144 lb 13.5 oz (65.7 kg)  07/17/19  148 lb (67.1 kg)  07/09/19  147 lb 9.6 oz (67 kg)         MSSA bacteremia 11/07/2022   Neck rigidity    post cervical fusion   Nocturia    PAIN, CHRONIC NEC 10/06/2006   Qualifier: Diagnosis of   By: Krystal RN, Leeroy       Pneumonia    Prostate disease    Right sided temporal headache 05/22/2018   S/P cervical spinal fusion 03/03/2022   With persistent cervical pain and palpable screws Led to disability History of attempt to dig furrow in skull to fix it Last surgery 1992   Senile ecchymosis 01/21/2019   Septic arthritis of wrist, left (HCC) 11/08/2022   Septic infrapatellar bursitis of right knee 11/07/2022   Staphylococcal Arthritis of Right Knee (Updated 01/30/2023) MRI 01/17/2023 shows worsening findings:  Worsening tear of posterior horn medial meniscus with large radial component Worsening subcortical stress fracture/osteochondral lesion of medial femoral condyle New subcortical stress fracture/osteochondral lesion of medial tibial plateau Moderate-to-large effusion with worsened synovitis    Staphylococcal arthritis of left wrist (HCC) 11/07/2022   Staphylococcal arthritis of right knee (HCC) 11/08/2022   Syncope 11/05/2022   Underweight on examination 05/03/2022    Past Surgical History:  Procedure Laterality Date   AORTIC VALVE REPLACEMENT N/A 11/08/2023   Procedure: AORTIC VALVE REPLACEMENT USING A INSPIRIS VALVE.;  Surgeon: [name], MD;  Location: [facility] OR;  Service: Open Heart  Surgery;  Laterality: N/A;   BIOPSY  07/19/2019   Procedure: BIOPSY;  Surgeon: San Sandor GAILS, DO;  Location: [facility] ENDOSCOPY;  Service: Gastroenterology;;   CERVICAL FUSION  1992   C2/C 3  four surgeries   CLIPPING OF ATRIAL APPENDAGE N/A 11/08/2023   Procedure: CLIPPING OF LEFT ATRIAL APPENDAGE USING A CLIP;  Surgeon: [name], MD;  Location: [facility] OR;  Service: Open Heart Surgery;  Laterality: N/A;   COLONOSCOPY     ESOPHAGOGASTRODUODENOSCOPY  07/20/2011   Procedure: ESOPHAGOGASTRODUODENOSCOPY (  EGD);  Surgeon: Lupita FORBES Commander, MD;  Location: [facility];  Service: Endoscopy;  Laterality: N/A;   ESOPHAGOGASTRODUODENOSCOPY (EGD) WITH PROPOFOL  N/A 07/19/2019   Procedure: ESOPHAGOGASTRODUODENOSCOPY (EGD) WITH PROPOFOL ;  Surgeon: San Sandor GAILS, DO;  Location: [facility];  Service: Gastroenterology;  Laterality: N/A;   I & D EXTREMITY Left 11/09/2022   Procedure: IRRIGATION AND DEBRIDEMENT LEFT WRIST;  Surgeon: Arlinda Buster, MD;  Location: [facility] OR;  Service: Orthopedics;  Laterality: Left;   INTRAOPERATIVE TRANSESOPHAGEAL ECHOCARDIOGRAM N/A 11/08/2023   Procedure: ECHOCARDIOGRAM, TRANSESOPHAGEAL, INTRAOPERATIVE;  Surgeon: [name], MD;  Location: [facility] OR;  Service: Open Heart Surgery;  Laterality: N/A;   RIGHT/LEFT HEART CATH AND CORONARY ANGIOGRAPHY N/A 11/06/2023   Procedure: RIGHT/LEFT HEART CATH AND CORONARY ANGIOGRAPHY;  Surgeon: Wonda Sharper, MD;  Location: [facility] INVASIVE CV LAB;  Service: Cardiovascular;  Laterality: N/A;   SAVORY DILATION  07/20/2011   Procedure: SAVORY DILATION;  Surgeon: Lupita FORBES Commander, MD;  Location: [facility];  Service: Endoscopy;  Laterality: N/A;   SPINAL FUSION  05/06/11   TRANSESOPHAGEAL ECHOCARDIOGRAM (CATH LAB) N/A 07/24/2023   Procedure: TRANSESOPHAGEAL ECHOCARDIOGRAM;  Surgeon: Pietro Redell RAMAN, MD;  Location: [facility] INVASIVE CV LAB;  Service: Cardiovascular;  Laterality: N/A;    Family History: Family History  Problem Relation Age of Onset   Heart  disease Mother    Heart attack Mother 77   Pancreatic cancer Father 43   Pancreatic cancer Brother    Anesthesia problems Neg Hx    Colon cancer Neg Hx    Liver cancer Neg Hx    Stomach cancer Neg Hx    Esophageal cancer Neg Hx    Rectal cancer Neg Hx     Social History  reports that he quit smoking about 5 years ago. His smoking use included cigarettes. He started smoking about 45 years ago. He has never used smokeless tobacco. He reports that he does not currently use alcohol  after a past usage of about 2.0 standard drinks of alcohol  per week. He reports that he does not use drugs.  Allergies[1]  Medications  Current Medications[2]  Vitals   Vitals:   02/02/24 1730 02/02/24 1800 02/02/24 1830 02/02/24 1948  BP: (!) 168/104 (!) 184/88 (!) 180/84 (!) 194/88  Pulse: 76 72  73  Resp: (!) 21 16 12 18   Temp:    97.9 F (36.6 C)  TempSrc:    Axillary  SpO2:    100%    There is no height or weight on file to calculate BMI.   Physical Exam   General: Laying comfortably in bed; in no acute distress.  HENT: Normal oropharynx and mucosa. Normal external appearance of ears and nose.  Neck: Supple, no pain or tenderness  CV: No JVD. No peripheral edema.  Pulmonary: Symmetric Chest rise. Normal respiratory effort.  Abdomen: Soft to touch, non-tender.  Ext: No cyanosis, edema, or deformity  Skin: No rash. Normal palpation of skin.   Musculoskeletal: Normal digits and nails by inspection. No clubbing.   Neurologic Examination  Mental status/Cognition: Alert, oriented to self, place, but not to month and year, fair attention.  Speech/language: Fluent, comprehension intact, object naming intact, repetition intact.  Cranial nerves:   CN II Pupils equal and reactive to light, no VF deficits    CN III,IV,VI EOM intact, no gaze preference or deviation, no nystagmus    CN V normal sensation in V1, V2, and V3 segments bilaterally    CN VII no asymmetry, no nasolabial fold flattening  CN VIII normal hearing to speech    CN IX & X normal palatal elevation, no uvular deviation    CN XI 5/5 head turn and 5/5 shoulder shrug bilaterally    CN XII midline tongue protrusion    Motor:  Muscle bulk: poor, tone normal Mvmt Root Nerve  Muscle Right Left Comments  SA C5/6 Ax Deltoid     EF C5/6 Mc Biceps 5 5   EE C6/7/8 Rad Triceps 5 5   WF C6/7 Med FCR     WE C7/8 PIN ECU     F Ab C8/T1 U ADM/FDI 5 5   HF L1/2/3 Fem Illopsoas 4+ 4+   KE L2/3/4 Fem Quad     DF L4/5 D Peron Tib Ant 5 5   PF S1/2 Tibial Grc/Sol 5 5    Sensation:  Light touch Intact throughout   Pin prick    Temperature    Vibration   Proprioception    Coordination/Complex Motor:  - Finger to Nose with slight ataxia in RUE - Heel to shin with slight ataxia in RLE - Rapid alternating movement are intact BL - Gait: deferred.  Labs/Imaging/Neurodiagnostic studies   CBC:  Recent Labs  Lab 2024/02/13 1042  WBC 7.5  NEUTROABS 5.4  HGB 10.4*  HCT 31.1*  MCV 90.9  PLT 149*   Basic Metabolic Panel:  Lab Results  Component Value Date   NA 135 02-13-2024   K 3.8 02-13-24   CO2 23 02-13-2024   GLUCOSE 469 (H) February 13, 2024   BUN 20 02-13-2024   CREATININE 1.40 (H) 13-Feb-2024   CALCIUM  9.2 02-13-24   GFRNONAA 51 (L) 02/13/2024   GFRAA >60 07/20/2019   Lipid Panel:  Lab Results  Component Value Date   LDLCALC 36 08/14/2023   HgbA1c:  Lab Results  Component Value Date   HGBA1C 7.7 (H) 11/12/2023   Urine Drug Screen:     Component Value Date/Time   LABOPIA NEGATIVE February 13, 2024 1147   COCAINSCRNUR NEGATIVE February 13, 2024 1147   COCAINSCRNUR Negative 07/21/2023 1228   LABBENZ NEGATIVE 2024-02-13 1147   AMPHETMU NEGATIVE 02/13/2024 1147   THCU NEGATIVE February 13, 2024 1147   LABBARB NEGATIVE 02-13-24 1147    Alcohol  Level No results found for: [facility] INR  Lab Results  Component Value Date   INR 1.6 (H) 11/08/2023   APTT  Lab Results  Component Value Date   APTT 41 (H) 11/08/2023    AED levels: No results found for: PHENYTOIN , ZONISAMIDE, LAMOTRIGINE, LEVETIRACETA  CT Head without contrast(Personally reviewed): L occipital lobe hypodensity  CT angio Head and Neck with contrast(Personally reviewed): Pending.  MRI Brain(Personally reviewed): 1. Scattered acute infarcts in the left PCA territory, primarily involving the cortex and subcortical white matter in the posteromedial left temporal lobe and in the inferior left occipital lobe. Mild associated edema and sulcal effacement. No midline shift. 2. Few additional small acute versus subacute infarcts in the posterior left MCA territory with involvement of the left precentral gyrus. 3. Moderate to severe chronic microvascular ischemic changes and generalized parenchymal volume loss. 4. Remote infarcts as above. ASSESSMENT   DJANGO NGUYEN is a 79 y.o. male with hx of Afibb but not on AC due to multiple falls and GI bleed who presents to the ED with a fall. He does not have a good recollection of events surrounding the fall.  MRI brain with embolic appearing L PCA and L MCA strokes. Suspect likely secondary to known hx of afibb and not on AC.  RECOMMENDATIONS  I dont think full stroke workup if going to significantly change management, since he is not going to be a candidate for anticoagulation with his falls. Will have stroke team weigh in and see if full workup for stroke is warranted. ______________________________________________________________________    Signed, Fong Mccarry, MD [facility]     [1]  Allergies Allergen Reactions   Nubain [Nalbuphine Hcl]     Muscle contraction  [2]  Current Facility-Administered Medications:    naloxone  (NARCAN ) injection 0.4 mg, 0.4 mg, Intravenous, PRN, Rathore, Vasundhra, MD   oxyCODONE  (Oxy IR/ROXICODONE ) immediate release tablet 10 mg, 10 mg, Oral, Q8H PRN, Rathore, Vasundhra, MD

## 2024-02-02 NOTE — Progress Notes (Signed)
 Plan of Care Note for accepted transfer   Patient: Michael Bender MRN: [id number]   DOA: 02/02/2024  Facility requesting transfer: [facility] Requesting Provider: Levander Reason for transfer: CVA Facility course: 78yo with h/o HTN, PAD, CVA, HLD, COPD, stage 3a CKD, and opioid dependence who presented on 1/2 with a fall.  He hit his head and had a laceration that was repaired in the ER.  CT with recent CVA, L occiptal lobe.  BP 230/ on arrival. Given Lopressor  and hydralazine , now 159/76.  Glucose 469, no DKA.  Creatinine 1.4, at baseline.  MRI with scattered acute infarcts in L PCA territory and a few additional small acute vs. Subacute infarcts in posterior L MCA territory.  Will observe at [facility] for neurology consult and further stroke work-up.   Plan of care: The patient is accepted for observation to Telemetry unit, at [facility]..    Author: Delon Herald, MD 02/02/2024  Check [email or web address] for on-call coverage.  Nursing staff, Please call [facility] Admits & Consults System-Wide number on Amion as soon as patient's arrival, so appropriate admitting provider can evaluate the pt.

## 2024-02-02 NOTE — ED Notes (Signed)
 Deanne with [facility] called for transport

## 2024-02-02 NOTE — ED Provider Notes (Signed)
..  Lac repair Hilma Steinhilber  Date/Time: 02/02/2024 11:25 AM  Performed by: Hildegard Loge, PA-C Authorized by: Hildegard Loge, PA-C   Consent:    Consent obtained:  Verbal   Consent given by:  Patient   Risks discussed:  Need for additional repair, infection, retained foreign body, poor cosmetic result and poor wound healing   Alternatives discussed:  No treatment Universal protocol:    Procedure explained and questions answered to patient or proxy's satisfaction: yes     Relevant documents present and verified: yes     Patient identity confirmed:  Verbally with patient and arm band Laceration details:    Length (cm):  15 Pre-procedure details:    Preparation:  Patient was prepped and draped in usual sterile fashion Treatment:    Area cleansed with:  Saline and povidone-iodine    Amount of cleaning:  Extensive   Irrigation solution:  Sterile saline   Irrigation volume:  1000   Irrigation method:  Tap   Debridement:  None   Undermining:  None Skin repair:    Repair method:  Sutures   Suture size:  4-0   Suture material:  Prolene   Suture technique:  Simple interrupted   Number of sutures:  9 Approximation:    Approximation:  Close Repair type:    Repair type:  Simple Post-procedure details:    Dressing:  Open (no dressing)   Procedure completion:  Tolerated well, no immediate complications     Hildegard Loge, PA-C 02/02/24 1127    Levander Houston, MD 02/02/24 1455

## 2024-02-02 NOTE — ED Notes (Signed)
 [facility] at bedside

## 2024-02-03 ENCOUNTER — Observation Stay (HOSPITAL_COMMUNITY)

## 2024-02-03 DIAGNOSIS — I63412 Cerebral infarction due to embolism of left middle cerebral artery: Secondary | ICD-10-CM | POA: Diagnosis not present

## 2024-02-03 DIAGNOSIS — E785 Hyperlipidemia, unspecified: Secondary | ICD-10-CM

## 2024-02-03 DIAGNOSIS — R29700 NIHSS score 0: Secondary | ICD-10-CM | POA: Diagnosis not present

## 2024-02-03 DIAGNOSIS — I639 Cerebral infarction, unspecified: Secondary | ICD-10-CM

## 2024-02-03 DIAGNOSIS — R29701 NIHSS score 1: Secondary | ICD-10-CM | POA: Diagnosis not present

## 2024-02-03 DIAGNOSIS — Z7985 Long-term (current) use of injectable non-insulin antidiabetic drugs: Secondary | ICD-10-CM | POA: Diagnosis not present

## 2024-02-03 DIAGNOSIS — Z87891 Personal history of nicotine dependence: Secondary | ICD-10-CM

## 2024-02-03 DIAGNOSIS — I5040 Unspecified combined systolic (congestive) and diastolic (congestive) heart failure: Secondary | ICD-10-CM | POA: Diagnosis not present

## 2024-02-03 DIAGNOSIS — I63432 Cerebral infarction due to embolism of left posterior cerebral artery: Secondary | ICD-10-CM | POA: Diagnosis not present

## 2024-02-03 DIAGNOSIS — I4891 Unspecified atrial fibrillation: Secondary | ICD-10-CM | POA: Diagnosis not present

## 2024-02-03 DIAGNOSIS — W19XXXA Unspecified fall, initial encounter: Secondary | ICD-10-CM | POA: Diagnosis not present

## 2024-02-03 DIAGNOSIS — E119 Type 2 diabetes mellitus without complications: Secondary | ICD-10-CM | POA: Diagnosis not present

## 2024-02-03 DIAGNOSIS — I110 Hypertensive heart disease with heart failure: Secondary | ICD-10-CM

## 2024-02-03 LAB — LIPID PANEL
Cholesterol: 129 mg/dL (ref 0–200)
HDL: 77 mg/dL
LDL Cholesterol: 37 mg/dL (ref 0–99)
Total CHOL/HDL Ratio: 1.7 ratio
Triglycerides: 74 mg/dL
VLDL: 15 mg/dL (ref 0–40)

## 2024-02-03 LAB — GLUCOSE, CAPILLARY
Glucose-Capillary: 157 mg/dL — ABNORMAL HIGH (ref 70–99)
Glucose-Capillary: 304 mg/dL — ABNORMAL HIGH (ref 70–99)
Glucose-Capillary: 191 mg/dL — ABNORMAL HIGH (ref 70–99)
Glucose-Capillary: 265 mg/dL — ABNORMAL HIGH (ref 70–99)

## 2024-02-03 LAB — BASIC METABOLIC PANEL WITH GFR
Anion gap: 16 — ABNORMAL HIGH (ref 5–15)
BUN: 20 mg/dL (ref 8–23)
CO2: 20 mmol/L — ABNORMAL LOW (ref 22–32)
Calcium: 8.9 mg/dL (ref 8.9–10.3)
Chloride: 105 mmol/L (ref 98–111)
Creatinine, Ser: 1.34 mg/dL — ABNORMAL HIGH (ref 0.61–1.24)
GFR, Estimated: 54 mL/min — ABNORMAL LOW
Glucose, Bld: 169 mg/dL — ABNORMAL HIGH (ref 70–99)
Potassium: 3.4 mmol/L — ABNORMAL LOW (ref 3.5–5.1)
Sodium: 141 mmol/L (ref 135–145)

## 2024-02-03 LAB — CBC
HCT: 34 % — ABNORMAL LOW (ref 39.0–52.0)
Hemoglobin: 11.5 g/dL — ABNORMAL LOW (ref 13.0–17.0)
MCH: 31.1 pg (ref 26.0–34.0)
MCHC: 33.8 g/dL (ref 30.0–36.0)
MCV: 91.9 fL (ref 80.0–100.0)
Platelets: 164 K/uL (ref 150–400)
RBC: 3.7 MIL/uL — ABNORMAL LOW (ref 4.22–5.81)
RDW: 14.6 % (ref 11.5–15.5)
WBC: 10.8 K/uL — ABNORMAL HIGH (ref 4.0–10.5)
nRBC: 0 % (ref 0.0–0.2)

## 2024-02-03 LAB — HEMOGLOBIN A1C
Hgb A1c MFr Bld: 8.5 % — ABNORMAL HIGH (ref 4.8–5.6)
Mean Plasma Glucose: 197.25 mg/dL

## 2024-02-03 MED ORDER — HYDRALAZINE HCL 20 MG/ML IJ SOLN: 10.0000 mg | Freq: Four times a day (QID) | INTRAMUSCULAR | Status: DC | PRN

## 2024-02-03 MED ORDER — BISACODYL 5 MG PO TBEC: 5.0000 mg | DELAYED_RELEASE_TABLET | Freq: Every day | ORAL | Status: DC | PRN

## 2024-02-03 MED ORDER — IOHEXOL 350 MG/ML SOLN
75.0000 mL | Freq: Once | INTRAVENOUS | Status: AC | PRN
  Administered 2024-02-03: 75 mL via INTRAVENOUS

## 2024-02-03 MED ORDER — DOXEPIN HCL 10 MG/ML PO CONC
6.0000 mg | Freq: Every day | ORAL | Status: DC
  Administered 2024-02-03: 6 mg via ORAL
  Filled 2024-02-03 (×2): qty 0.6

## 2024-02-03 MED ORDER — ASPIRIN 81 MG PO TBEC
81.0000 mg | DELAYED_RELEASE_TABLET | Freq: Every day | ORAL | Status: DC
  Administered 2024-02-03 – 2024-02-04 (×2): 81 mg via ORAL
  Filled 2024-02-03 (×2): qty 1

## 2024-02-03 MED ORDER — SACUBITRIL-VALSARTAN 24-26 MG PO TABS
1.0000 | ORAL_TABLET | Freq: Two times a day (BID) | ORAL | Status: DC
  Administered 2024-02-03 – 2024-02-04 (×3): 1 via ORAL
  Filled 2024-02-03 (×3): qty 1

## 2024-02-03 MED ORDER — POTASSIUM CHLORIDE 2 MEQ/ML IV SOLN
INTRAVENOUS | Status: AC
  Filled 2024-02-03 (×3): qty 1000

## 2024-02-03 MED ORDER — METOPROLOL TARTRATE 50 MG PO TABS
50.0000 mg | ORAL_TABLET | Freq: Two times a day (BID) | ORAL | Status: DC
  Administered 2024-02-03 – 2024-02-04 (×3): 50 mg via ORAL
  Filled 2024-02-03 (×3): qty 1

## 2024-02-03 MED ORDER — INSULIN GLARGINE 100 UNIT/ML ~~LOC~~ SOLN
10.0000 [IU] | Freq: Every day | SUBCUTANEOUS | Status: DC
  Administered 2024-02-03 – 2024-02-04 (×2): 10 [IU] via SUBCUTANEOUS
  Filled 2024-02-03 (×2): qty 0.1

## 2024-02-03 NOTE — Progress Notes (Signed)
 " PROGRESS NOTE    Michael Bender  FMW:[id number] DOB: [birth year] DOA: 02/02/2024 PCP: [name], MD   Brief Narrative:   Michael Bender is a 79 y.o. male with medical history significant of paroxysmal A-fib not on anticoagulation due to history of falls, history of GI bleed due to duodenal ulcer, chronic iron deficiency anemia, hypertension, CAD, severe aortic insufficiency status post bioprosthetic aortic valve replacement on 11/08/2023, HFpEF, PAD, CVA, hyperlipidemia, COPD, CKD stage IIIb, type 2 diabetes, chronic low back pain, opioid dependence, BPH presented to the ED after a fall.  Patient hit his head and sustained a large laceration to his head which was repaired in the ED with 9 sutures.  Blood pressure 248/101 on arrival but remainder of vital signs stable.  Labs notable for hemoglobin 10.4 (close to baseline), platelet count 149k, glucose 469, bicarb 23, anion gap 12, creatinine 1.4 (stable), troponin 82, ammonia level <13, UA without ketones or signs of infection, UDS negative.  Chest x-ray showing no acute cardiopulmonary findings.  CT head showing a new hypodensity in the left occipital lobe suspicious for a subacute to chronic infarct.  CT C-spine negative for acute traumatic injuries.  Brain MRI showing scattered acute infarcts in the left PCA territory primarily involving the cortex and subcortical white matter in the posterior medial left temporal lobe and in the inferior left occipital lobe with mild associated edema and sulcal effacement but no midline shift.  Showing a few additional small acute versus subacute infarcts in the posterior left MCA territory with involvement of the left precentral gyrus.  EKG showing sinus rhythm, QTc 497, mild T wave abnormalities in lateral leads, and no significant change compared to previous tracings.  Patient was given IV fentanyl , IV hydralazine , IV Ativan , IV metoprolol , and Tdap injection in the ED.   Patient is currently awake and  alert, resting comfortably.  He reports falling at home and hitting his head on a stove but does not know how he fell.  He is not sure if he lost consciousness.  Complaining of a headache.  Denies dizziness, chest pain, or shortness of breath.  Patient does not think he has diabetes and is not sure which medications he is taking at home as his son fills his pillbox.  He has no other complaints.  Denies vision changes, difficulty with speech, or weakness of his arm or leg.  Denies cough, nausea, vomiting, abdominal pain, diarrhea, or any urinary symptoms.     Assessment & Plan:   Principal Problem:   Acute CVA (cerebrovascular accident) (HCC) Active Problems:   HLD (hyperlipidemia)   COPD (chronic obstructive pulmonary disease) (HCC)   Elevated troponin   Hyperglycemia    Acute CVA Patient presenting after a fall, unclear whether this was a mechanical fall versus fall related to syncope.  No focal neurodeficit on exam.  He has history of previous stroke and multiple risk factors including paroxysmal A-fib for which he is not anticoagulated due to history of falls and previous GI bleed.  Brain MRI showing scattered acute infarcts in the left PCA territory primarily involving the cortex and subcortical white matter in the posterior medial left temporal lobe and in the inferior left occipital lobe with mild associated edema and sulcal effacement but no midline shift.  Also showing a few additional small acute versus subacute infarcts in the posterior left MCA territory with involvement of the left precentral gyrus.    Neurology following CTA was ordered, no further workup recommended  by neurology.  Patient is not a candidate for anticoagulation given recurrent bleeding/falls in the past. will have PT/OT evaluation. ?  If patient will benefit from evaluation for Watchman   Elevated troponin CAD Troponin 82 and 83 and EKG without acute ischemic changes, no chest pain, not consistent with  ACS. Cardiac cath done 11/06/2023 showing patent coronary arteries with mild diffuse calcific plaquing but no significant stenosis.    Large forehead laceration Secondary to fall.  Repaired in the ED with 9 sutures.  Patient was given Tdap injection in the ED.  Continue wound care.   Hyperglycemia Type 2 diabetes Last hemoglobin A1c 7.7 on 11/12/2023.  Glucose in the 400s on initial labs without signs of DKA. This morning shows increasing anion gap to 16 with bicarb down to 20.  UA was negative for ketones.  BHB was not checked. Will give IV fluid for 24 hours.  Placed on sensitive sliding scale insulin  ACHS.  Add Lantus  10 unit.  Check hemoglobin A1c Patient/son says he takes pills for diabetes.    Hypertension Will slowly allow blood pressure control. Resume Entresto .  Metoprolol   QT prolongation QTc 497 on EKG.  Monitor potassium and magnesium  levels.  Avoid QT prolonging drugs if possible.   Chronic HFpEF Last echo done 12/26/2023 showing EF 60 to 65%, grade 1 diastolic dysfunction, trivial mitral regurgitation, aortic valve replacement with trivial regurgitation, and borderline dilation of the ascending aorta.  No signs of volume overload at this time.   Paroxysmal A-fib He is not on chronic anticoagulation due to history of falls and previous GI bleed.  Currently in sinus rhythm. ?  Evaluate for watchman   Chronic anemia Hemoglobin close to baseline, monitor H&H.   PAD Hyperlipidemia Continue Crestor .   COPD Stable, no signs of acute exacerbation.  Patient tells me he does not use any inhalers at home.  DuoNeb PRN.   CKD stage IIIb Creatinine stable.   Chronic low back pain Opiate dependence Continue home oxycodone  10 mg every 8 hours PRN.   DVT prophylaxis: SCDs Code Status: Full Code (discussed with the patient) Family Communication: No family available at this time. Consults called: Neurology Level of care: Telemetry bed Admission status: It is my clinical  opinion that referral for OBSERVATION is reasonable and necessary in this patient based on the above information provided. The aforementioned taken together are felt to place the patient at high risk for further clinical deterioration. However, it is anticipated that the patient may be medically stable for discharge from the hospital within 24 to 48 hours.  Subjective:  Patient seen and examined at the bedside.  Son is present.  He denies any new concerns.  Vital signs are stable, blood pressure albeit in the higher side.  No fever.  Feels hungry.  Objective: Vitals:   02/02/24 1948 02/03/24 0005 02/03/24 0448 02/03/24 0727  BP: (!) 194/88 (!) 197/97 (!) 165/86 (!) 146/88  Pulse: 73 72 80 74  Resp: 18 15 14 16   Temp: 97.9 F (36.6 C) (!) 97.4 F (36.3 C) 99.9 F (37.7 C) 98.6 F (37 C)  TempSrc: Axillary Oral Oral   SpO2: 100% 100% 100% 99%    Intake/Output Summary (Last 24 hours) at 02/03/2024 1050 Last data filed at 02/03/2024 0400 Gross per 24 hour  Intake 240 ml  Output 400 ml  Net -160 ml   There were no vitals filed for this visit.  Physical Exam Vitals reviewed.  Constitutional:      General:  He is not in acute distress. HENT:     Head: Normocephalic.     Comments: Bandage dressing in place on forehead with no visible bleeding Eyes:     Extraocular Movements: Extraocular movements intact.  Cardiovascular:     Rate and Rhythm: Normal rate and regular rhythm.     Heart sounds: Normal heart sounds.  Pulmonary:     Effort: Pulmonary effort is normal. No respiratory distress.     Breath sounds: Normal breath sounds.  Abdominal:     General: Bowel sounds are normal.     Palpations: Abdomen is soft.     Tenderness: There is no abdominal tenderness. There is no guarding.  Musculoskeletal:     Right lower leg: No edema.     Left lower leg: No edema.  Skin:    General: Skin is warm and dry.  Neurological:     General: No focal deficit present.     Mental Status: He is  alert and oriented to person, place, and time.     Cranial Nerves: No cranial nerve deficit.     Sensory: No sensory deficit.     Motor: No weakness.   Data Reviewed: I have personally reviewed following labs and imaging studies  CBC: Recent Labs  Lab 02/02/24 1042 02/03/24 0332  WBC 7.5 10.8*  NEUTROABS 5.4  --   HGB 10.4* 11.5*  HCT 31.1* 34.0*  MCV 90.9 91.9  PLT 149* 164   Basic Metabolic Panel: Recent Labs  Lab 02/02/24 1042 02/02/24 2154 02/03/24 0332  NA 135  --  141  K 3.8  --  3.4*  CL 100  --  105  CO2 23  --  20*  GLUCOSE 469*  --  169*  BUN 20  --  20  CREATININE 1.40*  --  1.34*  CALCIUM  9.2  --  8.9  MG  --  2.1  --    GFR: CrCl cannot be calculated (Unknown ideal weight.). Liver Function Tests: Recent Labs  Lab 02/02/24 1042  AST 29  ALT 11  ALKPHOS 101  BILITOT 0.4  PROT 7.1  ALBUMIN  3.9   No results for input(s): LIPASE, AMYLASE in the last 168 hours. Recent Labs  Lab 02/02/24 1042  AMMONIA <13   Coagulation Profile: No results for input(s): INR, PROTIME in the last 168 hours. Cardiac Enzymes: No results for input(s): CKTOTAL, CKMB, CKMBINDEX, TROPONINI in the last 168 hours. BNP (last 3 results) Recent Labs    11/01/23 1109 12/04/23 1222 01/18/24 0000  PROBNP 634.0* 4,915* 7,929*   HbA1C: Recent Labs    02/03/24 0332  HGBA1C 8.5*   CBG: Recent Labs  Lab 02/02/24 2112 02/03/24 0624  GLUCAP 240* 157*   Lipid Profile: Recent Labs    02/03/24 0332  CHOL 129  HDL 77  LDLCALC 37  TRIG 74  CHOLHDL 1.7   Thyroid  Function Tests: No results for input(s): TSH, T4TOTAL, FREET4, T3FREE, THYROIDAB in the last 72 hours. Anemia Panel: No results for input(s): VITAMINB12, FOLATE, FERRITIN, TIBC, IRON, RETICCTPCT in the last 72 hours. Sepsis Labs: No results for input(s): PROCALCITON, LATICACIDVEN in the last 168 hours.  No results found for this or any previous visit (from the  past 240 hours).       Radiology Studies: CT ANGIO HEAD NECK W WO CM Result Date: 02/03/2024 EXAM: CTA Head and Neck with Intravenous Contrast. CT Head without Contrast. CLINICAL HISTORY: Stroke/TIA, determine embolic source. TECHNIQUE: Axial CTA images of the  head and neck performed with intravenous contrast. MIP reconstructed images were created and reviewed. Axial computed tomography images of the head/brain performed without intravenous contrast. Note: Per PQRS, the description of internal carotid artery percent stenosis, including 0 percent or normal exam, is based on [employer] Symptomatic Carotid Endarterectomy Trial (NASCET) criteria. Dose reduction technique was used including one or more of the following: automated exposure control, adjustment of mA and kV according to patient size, and/or iterative reconstruction. CONTRAST: With and without IV contrast. 75 mL (iohexol  (OMNIPAQUE ) 350 MG/ML injection 75 mL IOHEXOL  350 MG/ML SOLN). COMPARISON: MRI of the head dated 02/02/2024 and CT of the head dated 02/02/2024. FINDINGS: CT HEAD: BRAIN: Persistent hypodensity within the medial aspect of the left occipital lobe. Chronic infarcts are demonstrated within the left basal ganglia and coronal radiata. There is mild-to-moderate periventricular white matter disease. No acute intraparenchymal hemorrhage. No mass lesion. No CT evidence for acute territorial infarct. No midline shift or extra-axial collection. VENTRICLES: No hydrocephalus. ORBITS: The orbits are unremarkable. SINUSES AND MASTOIDS: The paranasal sinuses and mastoid air cells are clear. CTA NECK: VASCULATURE (Aortic Arch): The aortic arch demonstrates moderate calcific atheromatous disease. COMMON CAROTID ARTERIES: There is calcific plaque at the origin of the left common carotid artery, causing approximately 20% luminal stenosis. No dissection or occlusion. INTERNAL CAROTID ARTERIES: There is calcific plaque within the carotid bulbs  bilaterally, with less than 30% stenosis bilaterally. No dissection or occlusion. VERTEBRAL ARTERIES: The vertebral arteries are codominant and demonstrate no significant stenosis. No dissection or occlusion. CTA HEAD: INTERNAL CAROTID ARTERIES (intracranial segment): There is mild calcific plaque within the carotid siphons, but no appreciable stenosis. ANTERIOR CEREBRAL ARTERIES: No significant stenosis. No occlusion. No aneurysm. MIDDLE CEREBRAL ARTERIES: No significant stenosis. No occlusion. No aneurysm. POSTERIOR CEREBRAL ARTERIES: There is moderate irregular stenosis of the P1 segment of the left posterior cerebral artery and there is abrupt occlusion of the P2 segment seen on image 104 of series 16. BASILAR ARTERY: The basilar artery and the cerebellar arteries are patent. OTHER: SOFT TISSUES: No acute finding. No masses or lymphadenopathy. BONES: No acute osseous abnormality. IMPRESSION: 1. Acute evolving nonhemorrhagic infarct in the left occipital lobe. 2. Moderate irregular stenosis of the P1 segment of the left posterior cerebral artery with abrupt occlusion of the P2 segment. 3. Calcific plaque within the carotid bulbs bilaterally with less than 30% stenosis bilaterally. 4. Chronic infarcts in the left basal ganglia and corona radiata. 5. Mild-to-moderate periventricular white matter disease. Electronically signed by: Evalene Coho MD 02/03/2024 10:26 AM EST RP Workstation: [id number]   MR BRAIN WO CONTRAST Result Date: 02/02/2024 EXAM: MRI BRAIN WITHOUT CONTRAST 02/02/2024 01:26:02 PM TECHNIQUE: Multiplanar multisequence MRI of the head/brain was performed without the administration of intravenous contrast. COMPARISON: Same day CT head and MRI head 11/10/2023. CLINICAL HISTORY: Neuro deficit, acute, stroke suspected. FINDINGS: BRAIN AND VENTRICLES: Moderate to severe chronic microvascular ischemic changes. Similar appearance of generalized parenchymal volume loss. Remote infarcts involving the  left basal ganglia and corona radiata as well as the left thalamus. There are scattered areas of restricted diffusion in the left PCA territory primarily involving the cortex and subcortical white matter in the posteromedial left temporal lobe and in the inferior left occipital lobe. There is mild associated edema and sulcal effacement in this region. Few additional small acute versus subacute infarcts in the posterior left MCA territory with involvement of the left precentral gyrus. Small remote cortical infarcts in the bilateral frontal lobes. Remote infarcts in the  right cerebellum. Susceptibility in the left basal ganglia suggestive of remote hemorrhage. No mass. No midline shift. No hydrocephalus. The sella is unremarkable. Normal flow voids. ORBITS: Bilateral lens replacement. No acute abnormality. SINUSES AND MASTOIDS: Mucosal thickening in the left maxillary sinus. No acute abnormality. BONES AND SOFT TISSUES: Normal marrow signal. Right frontal scalp hematoma. Posterior fusion hardware in the upper cervical spine. IMPRESSION: 1. Scattered acute infarcts in the left PCA territory, primarily involving the cortex and subcortical white matter in the posteromedial left temporal lobe and in the inferior left occipital lobe. Mild associated edema and sulcal effacement. No midline shift. 2. Few additional small acute versus subacute infarcts in the posterior left MCA territory with involvement of the left precentral gyrus. 3. Moderate to severe chronic microvascular ischemic changes and generalized parenchymal volume loss. 4. Remote infarcts as above. Electronically signed by: Donnice Mania MD 02/02/2024 01:49 PM EST RP Workstation: [id number]   DG Chest Port 1 View Result Date: 02/02/2024 EXAM: 1 VIEW(S) XRAY OF THE CHEST 02/02/2024 10:24:20 AM COMPARISON: 12/14/2023 CLINICAL HISTORY: syncope vs fall FINDINGS: LUNGS AND PLEURA: No focal pulmonary opacity. No pleural effusion. No pneumothorax. HEART AND  MEDIASTINUM: Cardiac valve replacement and left atrial appendage clip noted. Aortic arch calcifications. Post-surgical changes of mediastinum. BONES AND SOFT TISSUES: Sternotomy wires intact. No acute osseous abnormality. IMPRESSION: 1. No acute cardiopulmonary findings. Postoperative changes of cardiac valve replacement and left atrial appendage clip with intact sternotomy wires. Electronically signed by: Evalene Coho MD 02/02/2024 10:49 AM EST RP Workstation: [id number]   CT Cervical Spine Wo Contrast Result Date: 02/02/2024 EXAM: CT CERVICAL SPINE WITHOUT CONTRAST 02/02/2024 10:30:13 AM TECHNIQUE: CT of the cervical spine was performed without the administration of intravenous contrast. Multiplanar reformatted images are provided for review. Automated exposure control, iterative reconstruction, and/or weight based adjustment of the mA/kV was utilized to reduce the radiation dose to as low as reasonably achievable. COMPARISON: CT of the cervical spine dated 02/17/2023. CLINICAL HISTORY: Neck trauma (Age >= 65y). FINDINGS: BONES AND ALIGNMENT: No acute fracture or traumatic malalignment. The patient is status post bilateral posterolateral spinal fusion at C2-C3. DEGENERATIVE CHANGES: Slight degenerative anterolisthesis at C4-C5. Moderate chronic degenerative disc disease at C5-C6 with endplate ridging resulting in mild central spinal canal stenosis and bilateral neural foraminal stenosis. SOFT TISSUES: No prevertebral soft tissue swelling. There are calcifications within the carotid bulbs and carotid arteries bilaterally. LUNGS: Mild emphysematous changes are noted in the lung apices. IMPRESSION: 1. No acute findings. 2. Moderate chronic degenerative disc disease at C5-6 with endplate ridging resulting in mild central spinal canal stenosis and bilateral neural foraminal stenosis. 3. Slight degenerative anterolisthesis at C4-5. 4. Status post bilateral posterolateral spinal fusion at C2-3. 5. Bilateral carotid  artery atherosclerotic calcifications. 6. Mild emphysematous changes in the lung apices. Pulmonary emphysema is an independent risk factor for lung cancer. Recommend consideration for evaluation for a low-dose CT lung cancer screening program. Electronically signed by: Evalene Coho MD 02/02/2024 10:48 AM EST RP Workstation: [id number]   CT Head Wo Contrast Result Date: 02/02/2024 EXAM: CT HEAD WITHOUT CONTRAST 02/02/2024 10:30:13 AM TECHNIQUE: CT of the head was performed without the administration of intravenous contrast. Automated exposure control, iterative reconstruction, and/or weight based adjustment of the mA/kV was utilized to reduce the radiation dose to as low as reasonably achievable. COMPARISON: 11/09/2023 CLINICAL HISTORY: Head trauma, minor (Age >= 65y). FINDINGS: BRAIN AND VENTRICLES: No acute hemorrhage. New hypodensity in the left occipital lobe compared to prior study. Chronic infarcts in the  left basal ganglia and the left thalamus. Moderate chronic microvascular ischemic change, unchanged from prior exam. No hydrocephalus. No extra-axial collection. No mass effect or midline shift. ORBITS: Bilateral lens replacement noted. SINUSES: Chronic appearing opacification of the right sphenoid sinus. SOFT TISSUES AND SKULL: Soft tissue swelling and laceration of the right forehead. No skull fracture. IMPRESSION: 1. New hypodensity in the left occipital lobe compared to prior study, likely representing a subacute to chronic infarct. 2. Chronic infarcts in the left basal ganglia and the left thalamus. 3. Moderate chronic microvascular ischemic change, unchanged from prior exam. 4. Soft tissue swelling and laceration of the right forehead. 5. Chronic appearing opacification of the right sphenoid sinus. Electronically signed by: Evalene Coho MD 02/02/2024 10:45 AM EST RP Workstation: [id number]        Scheduled Meds:  insulin  aspart  0-5 Units Subcutaneous QHS   insulin  aspart  0-9 Units  Subcutaneous TID WC   insulin  glargine  10 Units Subcutaneous Daily   rosuvastatin   40 mg Oral Daily   Continuous Infusions:  lactated ringers  1,000 mL with potassium chloride  10 mEq infusion            Foye Haggart, MD [facility] 02/03/2024, 10:50 AM   "

## 2024-02-03 NOTE — Progress Notes (Addendum)
 [facility] STROKE TEAM PROGRESS NOTE    79 y.o. male with history of Afib but not on AC due to multiple falls and GI bleed who presents to the ED with a fall.   MRI brain with embolic appearing L PCA and L MCA strokes. NIH on Admission 4.   INTERIM HISTORY/SUBJECTIVE  NIH 0 on exam today. Strokes likely cardioembolic, due to Afib not on OAC. Unfortunately, patient is not an anticoagulation candidate due to his history of multiple falls and GIB. For some secondary stroke protection, we can start him on Asprin.    OBJECTIVE  CBC    Component Value Date/Time   WBC 10.8 (H) 02/03/2024 0332   RBC 3.70 (L) 02/03/2024 0332   HGB 11.5 (L) 02/03/2024 0332   HGB 11.6 (L) 12/04/2023 1222   HCT 34.0 (L) 02/03/2024 0332   HCT 36.7 (L) 12/04/2023 1222   PLT 164 02/03/2024 0332   PLT 217 12/04/2023 1222   MCV 91.9 02/03/2024 0332   MCV 96 12/04/2023 1222   MCH 31.1 02/03/2024 0332   MCHC 33.8 02/03/2024 0332   RDW 14.6 02/03/2024 0332   RDW 16.2 (H) 12/04/2023 1222   LYMPHSABS 1.4 02/02/2024 1042   LYMPHSABS 2.6 08/14/2023 1037   MONOABS 0.6 02/02/2024 1042   EOSABS 0.1 02/02/2024 1042   EOSABS 0.7 (H) 08/14/2023 1037   BASOSABS 0.0 02/02/2024 1042   BASOSABS 0.1 08/14/2023 1037    BMET    Component Value Date/Time   NA 141 02/03/2024 0332   NA 137 12/26/2023 0851   K 3.4 (L) 02/03/2024 0332   CL 105 02/03/2024 0332   CO2 20 (L) 02/03/2024 0332   GLUCOSE 169 (H) 02/03/2024 0332   BUN 20 02/03/2024 0332   BUN 49 (H) 12/26/2023 0851   CREATININE 1.34 (H) 02/03/2024 0332   CREATININE 1.77 (H) 06/29/2023 1126   CREATININE 1.87 (H) 06/21/2023 1209   CALCIUM  8.9 02/03/2024 0332   CALCIUM  8.1 01/01/2024 0000   EGFR 30 (L) 01/18/2024 0853   GFRNONAA 54 (L) 02/03/2024 0332   GFRNONAA 39 (L) 06/29/2023 1126    IMAGING past 24 hours CT ANGIO HEAD NECK W WO CM Result Date: 02/03/2024 EXAM: CTA Head and Neck with Intravenous Contrast. CT Head without Contrast. CLINICAL HISTORY: Stroke/TIA,  determine embolic source. TECHNIQUE: Axial CTA images of the head and neck performed with intravenous contrast. MIP reconstructed images were created and reviewed. Axial computed tomography images of the head/brain performed without intravenous contrast. Note: Per PQRS, the description of internal carotid artery percent stenosis, including 0 percent or normal exam, is based on [employer] Symptomatic Carotid Endarterectomy Trial (NASCET) criteria. Dose reduction technique was used including one or more of the following: automated exposure control, adjustment of mA and kV according to patient size, and/or iterative reconstruction. CONTRAST: With and without IV contrast. 75 mL (iohexol  (OMNIPAQUE ) 350 MG/ML injection 75 mL IOHEXOL  350 MG/ML SOLN). COMPARISON: MRI of the head dated 02/02/2024 and CT of the head dated 02/02/2024. FINDINGS: CT HEAD: BRAIN: Persistent hypodensity within the medial aspect of the left occipital lobe. Chronic infarcts are demonstrated within the left basal ganglia and coronal radiata. There is mild-to-moderate periventricular white matter disease. No acute intraparenchymal hemorrhage. No mass lesion. No CT evidence for acute territorial infarct. No midline shift or extra-axial collection. VENTRICLES: No hydrocephalus. ORBITS: The orbits are unremarkable. SINUSES AND MASTOIDS: The paranasal sinuses and mastoid air cells are clear. CTA NECK: VASCULATURE (Aortic Arch): The aortic arch demonstrates moderate calcific atheromatous  disease. COMMON CAROTID ARTERIES: There is calcific plaque at the origin of the left common carotid artery, causing approximately 20% luminal stenosis. No dissection or occlusion. INTERNAL CAROTID ARTERIES: There is calcific plaque within the carotid bulbs bilaterally, with less than 30% stenosis bilaterally. No dissection or occlusion. VERTEBRAL ARTERIES: The vertebral arteries are codominant and demonstrate no significant stenosis. No dissection or occlusion. CTA  HEAD: INTERNAL CAROTID ARTERIES (intracranial segment): There is mild calcific plaque within the carotid siphons, but no appreciable stenosis. ANTERIOR CEREBRAL ARTERIES: No significant stenosis. No occlusion. No aneurysm. MIDDLE CEREBRAL ARTERIES: No significant stenosis. No occlusion. No aneurysm. POSTERIOR CEREBRAL ARTERIES: There is moderate irregular stenosis of the P1 segment of the left posterior cerebral artery and there is abrupt occlusion of the P2 segment seen on image 104 of series 16. BASILAR ARTERY: The basilar artery and the cerebellar arteries are patent. OTHER: SOFT TISSUES: No acute finding. No masses or lymphadenopathy. BONES: No acute osseous abnormality. IMPRESSION: 1. Acute evolving nonhemorrhagic infarct in the left occipital lobe. 2. Moderate irregular stenosis of the P1 segment of the left posterior cerebral artery with abrupt occlusion of the P2 segment. 3. Calcific plaque within the carotid bulbs bilaterally with less than 30% stenosis bilaterally. 4. Chronic infarcts in the left basal ganglia and corona radiata. 5. Mild-to-moderate periventricular white matter disease. Electronically signed by: Evalene Coho MD 02/03/2024 10:26 AM EST RP Workstation: [id number]   MR BRAIN WO CONTRAST Result Date: 02/02/2024 EXAM: MRI BRAIN WITHOUT CONTRAST 02/02/2024 01:26:02 PM TECHNIQUE: Multiplanar multisequence MRI of the head/brain was performed without the administration of intravenous contrast. COMPARISON: Same day CT head and MRI head 11/10/2023. CLINICAL HISTORY: Neuro deficit, acute, stroke suspected. FINDINGS: BRAIN AND VENTRICLES: Moderate to severe chronic microvascular ischemic changes. Similar appearance of generalized parenchymal volume loss. Remote infarcts involving the left basal ganglia and corona radiata as well as the left thalamus. There are scattered areas of restricted diffusion in the left PCA territory primarily involving the cortex and subcortical white matter in the  posteromedial left temporal lobe and in the inferior left occipital lobe. There is mild associated edema and sulcal effacement in this region. Few additional small acute versus subacute infarcts in the posterior left MCA territory with involvement of the left precentral gyrus. Small remote cortical infarcts in the bilateral frontal lobes. Remote infarcts in the right cerebellum. Susceptibility in the left basal ganglia suggestive of remote hemorrhage. No mass. No midline shift. No hydrocephalus. The sella is unremarkable. Normal flow voids. ORBITS: Bilateral lens replacement. No acute abnormality. SINUSES AND MASTOIDS: Mucosal thickening in the left maxillary sinus. No acute abnormality. BONES AND SOFT TISSUES: Normal marrow signal. Right frontal scalp hematoma. Posterior fusion hardware in the upper cervical spine. IMPRESSION: 1. Scattered acute infarcts in the left PCA territory, primarily involving the cortex and subcortical white matter in the posteromedial left temporal lobe and in the inferior left occipital lobe. Mild associated edema and sulcal effacement. No midline shift. 2. Few additional small acute versus subacute infarcts in the posterior left MCA territory with involvement of the left precentral gyrus. 3. Moderate to severe chronic microvascular ischemic changes and generalized parenchymal volume loss. 4. Remote infarcts as above. Electronically signed by: Donnice Mania MD 02/02/2024 01:49 PM EST RP Workstation: [id number]    Vitals:   02/02/24 1948 02/03/24 0005 02/03/24 0448 02/03/24 0727  BP: (!) 194/88 (!) 197/97 (!) 165/86 (!) 146/88  Pulse: 73 72 80 74  Resp: 18 15 14 16   Temp: 97.9 F (36.6 C) (!)  97.4 F (36.3 C) 99.9 F (37.7 C) 98.6 F (37 C)  TempSrc: Axillary Oral Oral   SpO2: 100% 100% 100% 99%     PHYSICAL EXAM General:  Alert, well-nourished, well-developed patient in no acute distress Psych:  Mood and affect appropriate for situation CV: Regular rate and rhythm on  monitor Respiratory:  Regular, unlabored respirations on room air GI: Abdomen soft and nontender   NEURO:  Mental Status: AA&Ox3, patient is able to give clear and coherent history Speech/Language: speech is without dysarthria or aphasia.  Naming, repetition, fluency, and comprehension intact.  Cranial Nerves:  II: PERRL. Visual fields right homonymous hemianopsia partial. III, IV, VI: EOMI. Eyelids elevate symmetrically.  V: Sensation is intact to light touch and symmetrical to face.  VII: Face is symmetrical resting and smiling VIII: hearing intact to voice. IX, X: Palate elevates symmetrically. Phonation is normal.  KP:Dynloizm shrug 5/5. XII: tongue is midline without fasciculations. Motor: 5/5 strength to all muscle groups tested.  Tone: is normal and bulk is normal Sensation- Intact to light touch bilaterally. Extinction absent to light touch to DSS.   Coordination: FTN intact bilaterally, HKS: no ataxia in BLE.No drift.  Gait- deferred  Most Recent NIH 1    ASSESSMENT/PLAN  Mr. CALI CUARTAS is a 79 y.o. male with history of Afib but not on AC due to multiple falls and GI bleed who presents to the ED with a fall. He does not have a good recollection of events surrounding the fall. MRI brain with embolic appearing L PCA and L MCA strokes. NIH on Admission 4.  Acute Ischemic Infarcts:  left PCA and MCA territory  Etiology:  Afib not on OAC  CT head   New hypodensity in the left occipital lobe compared to prior study, likely representing a subacute to chronic infarct. Chronic infarcts in the left basal ganglia and the left thalamus. Moderate chronic microvascular ischemic change, unchanged from prior exam. Soft tissue swelling and laceration of the right forehead. Chronic appearing opacification of the right sphenoid sinus. CTA head & neck  Acute evolving nonhemorrhagic infarct in the left occipital lobe. Moderate irregular stenosis of the P1 segment of the left  posterior cerebral artery with abrupt occlusion of the P2 segment. Calcific plaque within the carotid bulbs bilaterally with less than 30% stenosis bilaterally. Chronic infarcts in the left basal ganglia and corona radiata. Mild-to-moderate periventricular white matter disease. MRI   Scattered acute infarcts in the left PCA territory, primarily involving the cortex and subcortical white matter in the posteromedial left temporal lobe and in the inferior left occipital lobe. Mild associated edema and sulcal effacement. No midline shift. Few additional small acute versus subacute infarcts in the posterior left MCA territory with involvement of the left precentral gyrus. Moderate to severe chronic microvascular ischemic changes and generalized parenchymal volume loss. 2D Echo repeat not needed this admission due to recent echo and patient is not a candidate for [facility] d/t history of falls and GIB.  12/26/23: EF 60 to 65%, grade 1 diastolic dysfunction, trivial MVR, trivial AVR, noted aortic dilatation LDL 37 HgbA1c 8.5 No antithrombotic prior to admission, now on aspirin  81 mg daily Therapy recommendations:  Home Health OT Disposition:  pending  Atrial fibrillation Home Meds: Not on OAC due to history of multiple falls and GI bleed. Continue telemetry monitoring Continue to hold OAC due to noted history.  Patient may benefit from cardiology consult for possible Watchman workup. Will start aspirin  for secondary stroke prevention.  Hx of Stroke Remote infarcts seen on imaging Patient not an OAC candidate d/t history of falls, GIB Started on ASA  Hypertension CHF, combined Home meds: Lopressor  50 mg twice daily, Entresto  daily Jardiance  25 mg daily, Lasix  40 mg PRN weight gain Stable, on high end Blood Pressure Goal: BP less than 220/110 Permissive hypertension for 24 to 48 hours following acute acute symptom onset.  Then gradually normalize  Hyperlipidemia Home meds: Crestor  40 mg, resumed  in hospital LDL 37, goal < 70 Continue statin at discharge  Diabetes type II Uncontrolled Home meds: Trulicity  HgbA1c 8.5, goal < 7.0 CBGs SSI Recommend close follow-up with PCP for better DM control  Tobacco Abuse Former cigarette smoker  Other Stroke Risk Factors Congestive heart failure Migraines Imitrex  home med   Hospital day # 0 I have personally obtained history,examined this patient, reviewed notes, independently viewed imaging studies, participated in medical decision making and plan of care.ROS completed by me personally and pertinent positives fully documented  I have made any additions or clarifications directly to the above note. Agree with note above.  Patient presented with a fall and MRI shows embolic left PCA branch infarct.  Patient has history of A-fib but is not a long-term anticoagulation candidate because of fall risk and GI bleed.  Recommend aspirin  Plavix  for 3 weeks followed by aspirin  alone.  Check CT angiogram statin hemoglobin A1c.  Statin elevated lipids.  Mobilize out of bed.  Physical occupational speech therapy consults.  No family at the bedside.   I personally spent a total of 50 minutes in the care of the patient today including getting/reviewing separately obtained history, performing a medically appropriate exam/evaluation, counseling and educating, placing orders, referring and communicating with other health care professionals, documenting clinical information in the EHR, independently interpreting results, and coordinating care.        Eather Popp, MD Medical Director [facility] Pager: [phone] 02/03/2024 2:36 PM   Pt seen by Neuro NP/APP with MD. Note/plan to be edited by MD as needed.    Rocky JAYSON Likes, DNP [facility] Please use AMION for contact information & EPIC for messaging.    To contact Stroke Continuity provider, please refer to [email or web address]. After hours, contact General Neurology

## 2024-02-03 NOTE — Evaluation (Signed)
 Occupational Therapy Evaluation Patient Details Name: Michael Bender MRN: [id number] DOB: 13-Dec-1945 Today's Date: 02/03/2024   History of Present Illness   79 y.o. M presented to [facility]/02/24 after fall. Noted to have a large laceration on his right forehead. CT Head w/o contrast c/f new L Occipital lobe hypodensity. MRI brain with embolic appearing L PCA and L MCA strokes. NIHSS 2. PMHx:PVD, HTN, HLD, hx of orthostatic hypotension, GERD, multiple falls, traumatic rhabdomyolysis, AKI, acute urinary retention with bladder outlet obstruction, BPH, A-fib not on AC.     Clinical Impressions Pt seen for OT evaluation this AM. PTA, pt lived alone and was indep with BADL, IADLs, and mobility without AD. Sons drive and sort pt's medications in a pillbox. Today, pt presents with R periorbital edema and R visual field impairment, although pt denies any visual changes. Reports he needs reading glasses but never wears them. He is ataxic in R hand>RLE and rather unsteady. CGA for transfers and mobility, notable truncal sway during ambulation. CGA for seated and standing self-care tasks this date. Left upright in chair.  Pt is currently functioning below baseline and would benefit from ongoing acute OT services to progress towards safe discharge and to facilitate return to prior level of function. Current recommendation is home with home health OT.     If plan is discharge home, recommend the following:   A little help with walking and/or transfers;A little help with bathing/dressing/bathroom;Assistance with cooking/housework;Direct supervision/assist for medications management;Assist for transportation;Help with stairs or ramp for entrance     Functional Status Assessment   Patient has had a recent decline in their functional status and demonstrates the ability to make significant improvements in function in a reasonable and predictable amount of time.     Equipment Recommendations   None  recommended by OT     Recommendations for Other Services   PT consult     Precautions/Restrictions   Precautions Precautions: Fall Recall of Precautions/Restrictions: Intact Restrictions Weight Bearing Restrictions Per Provider Order: No     Mobility Bed Mobility Overal bed mobility: Modified Independent             General bed mobility comments: exited to the L side, HOB elevated, incr time    Transfers Overall transfer level: Needs assistance   Transfers: Sit to/from Stand, Bed to chair/wheelchair/BSC Sit to Stand: Contact guard assist     Step pivot transfers: Contact guard assist     General transfer comment: stood from bed without AD      Balance Overall balance assessment: Needs assistance Sitting-balance support: No upper extremity supported, Feet supported Sitting balance-Leahy Scale: Fair Sitting balance - Comments: seated EOB   Standing balance support: No upper extremity supported, During functional activity Standing balance-Leahy Scale: Poor Standing balance comment: Pt with anterior/posterior truncal sway in static and dynamic standing, CGA for stability/safety                           ADL either performed or assessed with clinical judgement   ADL Overall ADL's : Needs assistance/impaired Eating/Feeding: Set up;Sitting   Grooming: Contact guard assist;Standing;Wash/dry hands;Wash/dry face Grooming Details (indicate cue type and reason): occasional cues to locate grooming items/judge distance to soap dispenser in R environment         Upper Body Dressing : Contact guard assist;Sitting   Lower Body Dressing: Contact guard assist;Sitting/lateral leans   Toilet Transfer: Contact guard assist;Ambulation;Regular Toilet;Grab bars  Functional mobility during ADLs: Contact guard assist       Vision Baseline Vision/History: 1 Wears glasses (reports he needs reading glasses but does not wear them) Ability to See  in Adequate Light: 0 Adequate Patient Visual Report: No change from baseline (per patient report) Vision Assessment?: Yes Eye Alignment: Within Functional Limits Ocular Range of Motion: Within Functional Limits Alignment/Gaze Preference: Within Defined Limits Tracking/Visual Pursuits: Able to track stimulus in all quads without difficulty;Requires cues, head turns, or add eye shifts to track (additional effort/mild head turn when tracking to the R) Visual Fields: Right visual field deficit Depth Perception: Undershoots Additional Comments: R periorbital edema, reduced R peripheral vision, tracking inact bilat     Perception         Praxis         Pertinent Vitals/Pain Pain Assessment Pain Assessment: 0-10 Pain Score: 7  Pain Location: HA Pain Descriptors / Indicators: Aching Pain Intervention(s): Limited activity within patient's tolerance, Monitored during session, Repositioned     Extremity/Trunk Assessment Upper Extremity Assessment Upper Extremity Assessment: Right hand dominant;RUE deficits/detail RUE Deficits / Details: strength 4/5, FTN reduced accuracy and speed on the R RUE Sensation: WNL RUE Coordination: decreased fine motor   Lower Extremity Assessment Lower Extremity Assessment: Defer to PT evaluation   Cervical / Trunk Assessment Cervical / Trunk Assessment: Normal   Communication Communication Communication: Impaired Factors Affecting Communication: Hearing impaired   Cognition Arousal: Alert Behavior During Therapy: WFL for tasks assessed/performed Cognition: Cognition impaired   Orientation impairments: Time, Situation (vague understanding of situation, reports he did not know the year, stated January) Awareness: Online awareness impaired Memory impairment (select all impairments): Short-term memory, Declarative long-term memory Attention impairment (select first level of impairment): Sustained attention Executive functioning impairment (select all  impairments): Problem solving OT - Cognition Comments: participatory, suspect some baseline cog impairments; also HoH which may impact cog                 Following commands: Impaired Following commands impaired: Follows one step commands with increased time, Follows multi-step commands inconsistently     Cueing  General Comments   Cueing Techniques: Verbal cues;Gestural cues  BP EOB: 129/98, BP standing x1': 135/96, HR 100s throughout; pt denied lightheadedness   Exercises     Shoulder Instructions      Home Living Family/patient expects to be discharged to:: Private residence Living Arrangements: Alone Available Help at Discharge: Family;Available PRN/intermittently (son teaches karate (off during the day)) Type of Home: House Home Access: Level entry     Home Layout: One level     Bathroom Shower/Tub: Chief Strategy Officer: Standard Bathroom Accessibility: Yes   Home Equipment: Grab bars - tub/shower;Grab bars - toilet;Hand held shower head;Other (comment);Rolling Walker (2 wheels);Cane - single point;BSC/3in1;Shower seat (walking stick)          Prior Functioning/Environment Prior Level of Function : Independent/Modified Independent;History of Falls (last six months) (son drives to appointments; 3 falls)             Mobility Comments: uses walking sticks for community distances ADLs Comments: indep with BADL, son drives; other son organizes medications via pillbox    OT Problem List: Impaired balance (sitting and/or standing);Impaired vision/perception;Decreased coordination;Decreased cognition;Impaired UE functional use;Pain   OT Treatment/Interventions: Self-care/ADL training;Therapeutic exercise;Therapeutic activities;Cognitive remediation/compensation;Visual/perceptual remediation/compensation;Patient/family education;Balance training      OT Goals(Current goals can be found in the care plan section)   Acute Rehab OT Goals Patient  Stated Goal: get  better OT Goal Formulation: With patient Time For Goal Achievement: 02/17/24 Potential to Achieve Goals: Good   OT Frequency:  Min 2X/week    Co-evaluation              AM-PAC OT 6 Clicks Daily Activity     Outcome Measure Help from another person eating meals?: None Help from another person taking care of personal grooming?: A Little Help from another person toileting, which includes using toliet, bedpan, or urinal?: A Little Help from another person bathing (including washing, rinsing, drying)?: A Little Help from another person to put on and taking off regular upper body clothing?: A Little Help from another person to put on and taking off regular lower body clothing?: A Little 6 Click Score: 19   End of Session Equipment Utilized During Treatment: Gait belt Nurse Communication: Mobility status  Activity Tolerance: Patient tolerated treatment well Patient left: in chair;with call bell/phone within reach;with chair alarm set;with nursing/sitter in room  OT Visit Diagnosis: Unsteadiness on feet (R26.81);Other abnormalities of gait and mobility (R26.89);History of falling (Z91.81);Ataxia, unspecified (R27.0)                Time: 9181-9145 OT Time Calculation (min): 36 min Charges:  OT General Charges $OT Visit: 1 Visit OT Evaluation $OT Eval Moderate Complexity: 1 Mod  Demya Scruggs M. [place], OTR/L [facility] Acute Rehabilitation Services [phone] Secure Chat Preferred  Rikki Burma 02/03/2024, 9:58 AM

## 2024-02-03 NOTE — Plan of Care (Signed)
" °  Problem: Health Behavior/Discharge Planning: Goal: Ability to manage health-related needs will improve Outcome: Progressing   Problem: Clinical Measurements: Goal: Will remain free from infection Outcome: Progressing Goal: Diagnostic test results will improve Outcome: Progressing   Problem: Elimination: Goal: Will not experience complications related to bowel motility Outcome: Progressing Goal: Will not experience complications related to urinary retention Outcome: Progressing   Problem: Skin Integrity: Goal: Risk for impaired skin integrity will decrease Outcome: Progressing   Problem: Fluid Volume: Goal: Ability to maintain a balanced intake and output will improve Outcome: Progressing   "

## 2024-02-03 NOTE — Evaluation (Signed)
 Physical Therapy Evaluation Patient Details Name: Michael Bender MRN: [id number] DOB: 08/11/1945 Today's Date: 02/03/2024  History of Present Illness  79 y.o. M [facility] after [facility]. Noted to have a large laceration on his right forehead. CT Head w/o contrast c/f new L Occipital lobe hypodensity. MRI brain with embolic appearing L PCA and L MCA strokes. NIHSS 2. PMHx:PVD, HTN, HLD, hx of orthostatic hypotension, GERD, multiple falls, traumatic rhabdomyolysis, AKI, acute urinary retention with bladder outlet obstruction, BPH, A-fib not on AC.   Clinical Impression  Pt admitted with above diagnosis. PTA, pt was independent to modI for functional mobility using a walking stick in the community. He lives alone in a one story house with a level entry.  Pt currently with functional limitations due to the deficits listed below (see PT Problem List). He performed bed mobility with modI and required CGA for sit<>stand and gait using RW. Pt is currently limited by head pain, R visual field impairment, impaired balance, and decreased activity tolerance. Pt will benefit from acute skilled PT to increase his independence and safety with mobility to allow discharge. Recommend HHPT to increased strength, improve balance, decrease fall risk, and optimize safety within the home environment.      If plan is discharge home, recommend the following: A little help with walking and/or transfers;A little help with bathing/dressing/bathroom;Assistance with cooking/housework;Assist for transportation   Can travel by private vehicle        Equipment Recommendations Rolling walker (2 wheels)  Recommendations for Other Services       Functional Status Assessment Patient has had a recent decline in their functional status and demonstrates the ability to make significant improvements in function in a reasonable and predictable amount of time.     Precautions / Restrictions Precautions Precautions:  Fall Recall of Precautions/Restrictions: Intact Precaution/Restrictions Comments: BP Goal: less than 220/110, permissive HTN for 24-48 hours following acute acute symptom onset; then gradually normalize Restrictions Weight Bearing Restrictions Per Provider Order: No      Mobility  Bed Mobility Overal bed mobility: Modified Independent             General bed mobility comments: exited to the L side, HOB elevated, incr time. Pt managed sheets. Assist for line management.    Transfers Overall transfer level: Needs assistance Equipment used: Rolling walker (2 wheels) Transfers: Sit to/from Stand Sit to Stand: Contact guard assist           General transfer comment: Introduced RW and educate pt on proper and safe use of AD. Cued proper hand placement using RW. Powered up with CGA. Good eccentric control. Pt declined to sit up in recliner chair, reporting he had been up for most of the morning.    Ambulation/Gait Ambulation/Gait assistance: Contact guard assist Gait Distance (Feet): 150 Feet Assistive device: Rolling walker (2 wheels) Gait Pattern/deviations: Step-through pattern, Decreased stride length Gait velocity: decr Gait velocity interpretation: <1.8 ft/sec, indicate of risk for recurrent falls   General Gait Details: Pt ambulated with a reciprocal gait pattern, even weight shift, and good foot clearence. He maintained upright posture with good proximity to RW. Pt navigated room/hallway well. No LOB.  Stairs            Wheelchair Mobility     Tilt Bed    Modified Rankin (Stroke Patients Only) Modified Rankin (Stroke Patients Only) Pre-Morbid Rankin Score: No symptoms Modified Rankin: Moderate disability     Balance Overall balance assessment: Needs assistance Sitting-balance support:  No upper extremity supported, Feet supported Sitting balance-Leahy Scale: Fair Sitting balance - Comments: seated EOB   Standing balance support: No upper extremity  supported, During functional activity Standing balance-Leahy Scale: Poor Standing balance comment: Pt dependent on RW                             Pertinent Vitals/Pain Pain Assessment Pain Assessment: 0-10 Pain Score: 6  Pain Location: Head Pain Descriptors / Indicators: Headache Pain Intervention(s): Monitored during session, Limited activity within patient's tolerance, Repositioned    Home Living Family/patient expects to be discharged to:: Private residence Living Arrangements: Alone Available Help at Discharge: Family;Available PRN/intermittently (son teaches karate (off during the day)) Type of Home: House Home Access: Level entry       Home Layout: One level Home Equipment: Grab bars - tub/shower;Grab bars - toilet;Hand held shower head;Other (comment);Rolling Walker (2 wheels);Cane - single point;BSC/3in1;Shower seat (walking stick)      Prior Function Prior Level of Function : Independent/Modified Independent;History of Falls (last six months)             Mobility Comments: uses walking sticks for community distances, ambulates without AD in the house. 3 falls in the past 53mo. ADLs Comments: indep with BADL, son drives; other son organizes medications via pillbox     Extremity/Trunk Assessment   Upper Extremity Assessment Upper Extremity Assessment: Defer to OT evaluation    Lower Extremity Assessment Lower Extremity Assessment: Overall WFL for tasks assessed    Cervical / Trunk Assessment Cervical / Trunk Assessment: Normal  Communication   Communication Communication: Impaired Factors Affecting Communication: Hearing impaired    Cognition Arousal: Alert Behavior During Therapy: WFL for tasks assessed/performed   PT - Cognitive impairments: No family/caregiver present to determine baseline, Orientation, Awareness   Orientation impairments: Situation (Pt didn't fully understand why he was hospitalized, requesting to go home as soon as  possible)                   PT - Cognition Comments: Pt participated well throughout session Following commands: Impaired Following commands impaired: Follows one step commands with increased time, Follows multi-step commands inconsistently     Cueing Cueing Techniques: Verbal cues, Gestural cues     General Comments General comments (skin integrity, edema, etc.): VSS on RA. Pt with dressing/bandage around R eye and over R eyebrow. R periorbital edema.    Exercises     Assessment/Plan    PT Assessment Patient needs continued PT services  PT Problem List Decreased activity tolerance;Decreased balance;Decreased mobility;Decreased knowledge of use of DME;Decreased safety awareness       PT Treatment Interventions DME instruction;Gait training;Functional mobility training;Therapeutic activities;Therapeutic exercise;Patient/family education    PT Goals (Current goals can be found in the Care Plan section)  Acute Rehab PT Goals Patient Stated Goal: Return Home PT Goal Formulation: With patient Time For Goal Achievement: 02/17/24 Potential to Achieve Goals: Good    Frequency Min 2X/week     Co-evaluation               AM-PAC PT 6 Clicks Mobility  Outcome Measure Help needed turning from your back to your side while in a flat bed without using bedrails?: None Help needed moving from lying on your back to sitting on the side of a flat bed without using bedrails?: None Help needed moving to and from a bed to a chair (including a wheelchair)?: A Little Help  needed standing up from a chair using your arms (e.g., wheelchair or bedside chair)?: A Little Help needed to walk in hospital room?: A Little Help needed climbing 3-5 steps with a railing? : A Lot 6 Click Score: 19    End of Session Equipment Utilized During Treatment: Gait belt Activity Tolerance: Patient tolerated treatment well Patient left: in bed;with call bell/phone within reach;with bed alarm  set Nurse Communication: Mobility status PT Visit Diagnosis: Other abnormalities of gait and mobility (R26.89);Unsteadiness on feet (R26.81);Difficulty in walking, not elsewhere classified (R26.2)    Time: 1521-1540 PT Time Calculation (min) (ACUTE ONLY): 19 min   Charges:   PT Evaluation $PT Eval Moderate Complexity: 1 Mod   PT General Charges $$ ACUTE PT VISIT: 1 Visit         Randall SAUNDERS, PT, DPT Acute Rehabilitation Services Office: [phone] Secure Chat Preferred  Delon CHRISTELLA Callander 02/03/2024, 3:47 PM

## 2024-02-04 ENCOUNTER — Other Ambulatory Visit (HOSPITAL_COMMUNITY): Payer: Self-pay

## 2024-02-04 DIAGNOSIS — R29700 NIHSS score 0: Secondary | ICD-10-CM | POA: Diagnosis not present

## 2024-02-04 DIAGNOSIS — I4891 Unspecified atrial fibrillation: Secondary | ICD-10-CM | POA: Diagnosis not present

## 2024-02-04 DIAGNOSIS — I639 Cerebral infarction, unspecified: Secondary | ICD-10-CM | POA: Diagnosis not present

## 2024-02-04 DIAGNOSIS — I63412 Cerebral infarction due to embolism of left middle cerebral artery: Secondary | ICD-10-CM | POA: Diagnosis not present

## 2024-02-04 DIAGNOSIS — I63432 Cerebral infarction due to embolism of left posterior cerebral artery: Secondary | ICD-10-CM | POA: Diagnosis not present

## 2024-02-04 LAB — BASIC METABOLIC PANEL WITH GFR
Anion gap: 10 (ref 5–15)
BUN: 29 mg/dL — ABNORMAL HIGH (ref 8–23)
CO2: 23 mmol/L (ref 22–32)
Calcium: 8.2 mg/dL — ABNORMAL LOW (ref 8.9–10.3)
Chloride: 106 mmol/L (ref 98–111)
Creatinine, Ser: 1.53 mg/dL — ABNORMAL HIGH (ref 0.61–1.24)
GFR, Estimated: 46 mL/min — ABNORMAL LOW
Glucose, Bld: 111 mg/dL — ABNORMAL HIGH (ref 70–99)
Potassium: 3.5 mmol/L (ref 3.5–5.1)
Sodium: 138 mmol/L (ref 135–145)

## 2024-02-04 LAB — GLUCOSE, CAPILLARY: Glucose-Capillary: 145 mg/dL — ABNORMAL HIGH (ref 70–99)

## 2024-02-04 MED ORDER — CLOPIDOGREL BISULFATE 75 MG PO TABS: 75.0000 mg | ORAL_TABLET | Freq: Every day | ORAL | Status: DC

## 2024-02-04 MED ORDER — ASPIRIN 81 MG PO TBEC
81.0000 mg | DELAYED_RELEASE_TABLET | Freq: Every day | ORAL | 12 refills | Status: AC
  Filled 2024-02-04: qty 30, 30d supply, fill #0

## 2024-02-04 MED ORDER — CLOPIDOGREL BISULFATE 75 MG PO TABS
75.0000 mg | ORAL_TABLET | Freq: Every day | ORAL | 0 refills | Status: DC
  Filled 2024-02-04: qty 20, 20d supply, fill #0

## 2024-02-04 NOTE — Discharge Summary (Signed)
 Physician Discharge Summary  KLYDE BANKA FMW:[id number] DOB: 11-May-1945 DOA: 02/02/2024  PCP: [name], MD  Admit date: 02/02/2024 Discharge date: 02/04/2024  Admitted From: Home Disposition: Home with HH  Recommendations for Outpatient Follow-up:  Follow up with PCP in 1 week , wound check and staples removal.  Need to  review several  medications that he has not been taking consistently Follow up in ED if recurrence of symptoms  Poststroke follow-up with neurology Follow-up with cardiology, discuss possibility of Watchman  Home Health: No Equipment/Devices: None  Discharge Condition: Stable CODE STATUS: Full Diet recommendation: Heart healthy  Brief/Interim Summary:  Michael Bender is a 79 y.o. male with medical history significant of paroxysmal A-fib not on anticoagulation due to history of falls, history of GI bleed due to duodenal ulcer, chronic iron deficiency anemia, hypertension, CAD, severe aortic insufficiency status post bioprosthetic aortic valve replacement on 11/08/2023, HFpEF, PAD, CVA, hyperlipidemia, COPD, CKD stage IIIb, type 2 diabetes, chronic low back pain, opioid dependence, BPH presented to the ED after a fall.  Patient hit his head and sustained a large laceration to his head which was repaired in the ED with 9 sutures.  Blood pressure 248/101 on arrival but remainder of vital signs stable.  Labs notable for hemoglobin 10.4 (close to baseline), platelet count 149k, glucose 469, bicarb 23, anion gap 12, creatinine 1.4 (stable), troponin 82, ammonia level <13, UA without ketones or signs of infection, UDS negative.  Chest x-ray showing no acute cardiopulmonary findings.  CT head showing a new hypodensity in the left occipital lobe suspicious for a subacute to chronic infarct.  CT C-spine negative for acute traumatic injuries.  Brain MRI showing scattered acute infarcts in the left PCA territory primarily involving the cortex and subcortical white matter in the  posterior medial left temporal lobe and in the inferior left occipital lobe with mild associated edema and sulcal effacement but no midline shift.  Showing a few additional small acute versus subacute infarcts in the posterior left MCA territory with involvement of the left precentral gyrus.  EKG showing sinus rhythm, QTc 497, mild T wave abnormalities in lateral leads, and no significant change compared to previous tracings.  Patient was given IV fentanyl , IV hydralazine , IV Ativan , IV metoprolol , and Tdap injection in the ED.   Patient admitted for further observation, workup for stroke.  CT angiogram of head and neck revealed left P2 occlusion with irregular stenosis of the left P1 segment.  Evaluated by neurology.  Echocardiogram was not done given that he did not have a candidate for anticoagulation.  Neurology recommended dual antiplatelet therapy with aspirin  and Plavix  for 3 weeks followed by aspirin  alone.  Also patient can follow-up with cardiology for possibility of Watchman device.  Acute CVA: As above   Elevated troponin CAD Troponin 82 and 83 and EKG without acute ischemic changes, no chest pain, not consistent with ACS. Cardiac cath done 11/06/2023 showing patent coronary arteries with mild diffuse calcific plaquing but no significant stenosis.     Large forehead laceration Secondary to fall.  Repaired in the ED with 9 sutures.  Patient was given Tdap injection in the ED.  Continue local wound care.  Follow-up with PCP in 1 week   Hyperglycemia Type 2 diabetes Last hemoglobin A1c 7.7 on 11/12/2023.  Glucose in the 400s on initial labs without signs of DKA. Received IV fluids. Continue prior to admission medications at home.  Follow-up with PCP.   Hypertension Entresto , metoprolol  resumed  QT prolongation QTc 497 on EKG.  Monitor potassium and magnesium  levels.  Avoid QT prolonging drugs if possible.   Chronic HFpEF Last echo done 12/26/2023 showing EF 60 to 65%, grade 1  diastolic dysfunction, trivial mitral regurgitation, aortic valve replacement with trivial regurgitation, and borderline dilation of the ascending aorta.  No signs of volume overload at this time.   Paroxysmal A-fib He is not on chronic anticoagulation due to history of falls and previous GI bleed.  Currently in sinus rhythm. ?  Evaluate for watchman with cardiology outpatient.   Chronic anemia Hemoglobin close to baseline, monitor H&H.   PAD Hyperlipidemia Continue Crestor .   COPD Stable, no signs of acute exacerbation.  Patient tells me he does not use any inhalers at home.  DuoNeb PRN.   CKD stage IIIb Creatinine stable.   Chronic low back pain Opiate dependence Continue home oxycodone  10 mg every 8 hours PRN.  Patient is discharged home with home health  Discharge Diagnoses:  Principal Problem:   Acute CVA (cerebrovascular accident) (HCC) Active Problems:   HLD (hyperlipidemia)   COPD (chronic obstructive pulmonary disease) (HCC)   Elevated troponin   Hyperglycemia    Discharge Instructions  Discharge Instructions     Ambulatory referral to Cardiology   Complete by: As directed    Evaluate for watchman device   Ambulatory referral to Neurology   Complete by: As directed    An appointment is requested in approximately: 2 weeks   Diet - low sodium heart healthy   Complete by: As directed    Discharge instructions   Complete by: As directed    1.  Follow-up with neurology in 2 to 3 weeks. 2.  Follow-up with PCP for routine health management/management of chronic issues like diabetes mellitus and cardiovascular disease.   Increase activity slowly   Complete by: As directed    Leave dressing on - Keep it clean, dry, and intact until clinic visit   Complete by: As directed       Allergies as of 02/04/2024       Reactions   Nubain [nalbuphine Hcl]    Muscle contraction        Medication List     STOP taking these medications    traZODone  100 MG  tablet Commonly known as: DESYREL        TAKE these medications    Accu-Chek Guide Test test strip Generic drug: glucose blood 1 each by Other route 4 (four) times daily.   albuterol  108 (90 Base) MCG/ACT inhaler Commonly known as: VENTOLIN  HFA Inhale 2 puffs into the lungs every 6 (six) hours as needed for wheezing or shortness of breath.   amitriptyline  50 MG tablet Commonly known as: ELAVIL  Take 50 mg by mouth at bedtime.   aspirin  EC 81 MG tablet Take 1 tablet (81 mg total) by mouth daily. Swallow whole. Start taking on: February 05, 2024   B-complex with vitamin C tablet Take 1 tablet by mouth daily.   bisacodyl  5 MG EC tablet Generic drug: bisacodyl  Take 1 tablet (5 mg total) by mouth daily as needed for moderate constipation.   Blood Glucose Monitoring Suppl Devi 1 each by Does not apply route in the morning, at noon, and at bedtime. May substitute to any manufacturer covered by patient's insurance.   Accu-Chek Guide Me w/Device Kit Inject 1 Application into the skin 4 (four) times daily.   clopidogrel  75 MG tablet Commonly known as: PLAVIX  Take 1 tablet (75 mg total)  by mouth daily.   DayVigo  5 MG Tabs Generic drug: Lemborexant  Take 1 tablet (5 mg total) by mouth at bedtime. What changed: Another medication with the same name was removed. Continue taking this medication, and follow the directions you see here.   Doxepin  HCl 6 MG Tabs Take 1 tablet (6 mg total) by mouth at bedtime.   empagliflozin  25 MG Tabs tablet Commonly known as: JARDIANCE  Take 1 tablet (25 mg total) by mouth daily.   Entresto  24-26 MG Generic drug: sacubitril -valsartan  Take 1 tablet by mouth 2 (two) times daily.   FreeStyle Libre 3 Plus Sensor Misc Change sensor every 15 days.   furosemide  40 MG tablet Commonly known as: Lasix  Take 1 tablet (40 mg total) by mouth daily.   ipratropium-albuterol  0.5-2.5 (3) MG/3ML Soln Commonly known as: DUONEB Take 3 mLs by nebulization every  4 (four) hours as needed.   levETIRAcetam  500 MG tablet Commonly known as: KEPPRA  Take 1 tablet (500 mg total) by mouth 2 (two) times daily.   Mag-G 500 (27 Mg) MG Tabs tablet Generic drug: magnesium  gluconate Take 0.5 tablets (250 mg total) by mouth at bedtime.   metoprolol  tartrate 50 MG tablet Commonly known as: LOPRESSOR  Take 1 tablet (50 mg total) by mouth 2 (two) times daily.   multivitamin with minerals Tabs tablet Take 1 tablet by mouth daily.   Nebulizer/Tubing/Mouthpiece Kit 1 each by Does not apply route every 4 (four) hours as needed.   Oxycodone  HCl 10 MG Tabs Take 1 tablet (10 mg total) by mouth every 8 (eight) hours as needed.   pantoprazole  40 MG tablet Commonly known as: PROTONIX  Take 1 tablet (40 mg total) by mouth daily.   polyethylene glycol 17 g packet Commonly known as: MIRALAX  / GLYCOLAX  Take 17 g by mouth daily as needed for moderate constipation.   ramelteon  8 MG tablet Commonly known as: ROZEREM  Take 1 tablet (8 mg total) by mouth at bedtime.   rosuvastatin  40 MG tablet Commonly known as: CRESTOR  TAKE 1 TABLET BY MOUTH DAILY. REPLACES ATORVASTATIN  (STOP ATORVASTATIN  IF STILL TAKING)   spironolactone  25 MG tablet Commonly known as: ALDACTONE  Take 25 mg by mouth 2 (two) times daily.   SUMAtriptan  50 MG tablet Commonly known as: IMITREX  TAKE 1 TABLET (50 MG TOTAL) BY MOUTH DAILY. MAY REPEAT IN 2 HOURS IF HEADACHE PERSISTS OR RECURS.   Trulicity  0.75 MG/0.5ML Soaj Generic drug: Dulaglutide  Inject 0.75 mg into the skin once a week. Replaces ozempic    vitamin D3 25 MCG tablet Commonly known as: CHOLECALCIFEROL  Take 4 tablets (4,000 Units total) by mouth daily.               Discharge Care Instructions  (From admission, onward)           Start     Ordered   02/04/24 0000  Leave dressing on - Keep it clean, dry, and intact until clinic visit        02/04/24 1051            Follow-up Information     [name],  MD. Schedule an appointment as soon as possible for a visit in 1 week(s).   Specialty: Internal Medicine Contact information: [address] [place] [address] [phone]                Allergies[1]  Consultations:    Procedures/Studies: CT ANGIO HEAD NECK W WO CM Result Date: 02/03/2024 EXAM: CTA Head and Neck with Intravenous Contrast. CT  Head without Contrast. CLINICAL HISTORY: Stroke/TIA, determine embolic source. TECHNIQUE: Axial CTA images of the head and neck performed with intravenous contrast. MIP reconstructed images were created and reviewed. Axial computed tomography images of the head/brain performed without intravenous contrast. Note: Per PQRS, the description of internal carotid artery percent stenosis, including 0 percent or normal exam, is based on [employer] Symptomatic Carotid Endarterectomy Trial (NASCET) criteria. Dose reduction technique was used including one or more of the following: automated exposure control, adjustment of mA and kV according to patient size, and/or iterative reconstruction. CONTRAST: With and without IV contrast. 75 mL (iohexol  (OMNIPAQUE ) 350 MG/ML injection 75 mL IOHEXOL  350 MG/ML SOLN). COMPARISON: MRI of the head dated 02/02/2024 and CT of the head dated 02/02/2024. FINDINGS: CT HEAD: BRAIN: Persistent hypodensity within the medial aspect of the left occipital lobe. Chronic infarcts are demonstrated within the left basal ganglia and coronal radiata. There is mild-to-moderate periventricular white matter disease. No acute intraparenchymal hemorrhage. No mass lesion. No CT evidence for acute territorial infarct. No midline shift or extra-axial collection. VENTRICLES: No hydrocephalus. ORBITS: The orbits are unremarkable. SINUSES AND MASTOIDS: The paranasal sinuses and mastoid air cells are clear. CTA NECK: VASCULATURE (Aortic Arch): The aortic arch demonstrates moderate calcific atheromatous disease. COMMON CAROTID ARTERIES: There is  calcific plaque at the origin of the left common carotid artery, causing approximately 20% luminal stenosis. No dissection or occlusion. INTERNAL CAROTID ARTERIES: There is calcific plaque within the carotid bulbs bilaterally, with less than 30% stenosis bilaterally. No dissection or occlusion. VERTEBRAL ARTERIES: The vertebral arteries are codominant and demonstrate no significant stenosis. No dissection or occlusion. CTA HEAD: INTERNAL CAROTID ARTERIES (intracranial segment): There is mild calcific plaque within the carotid siphons, but no appreciable stenosis. ANTERIOR CEREBRAL ARTERIES: No significant stenosis. No occlusion. No aneurysm. MIDDLE CEREBRAL ARTERIES: No significant stenosis. No occlusion. No aneurysm. POSTERIOR CEREBRAL ARTERIES: There is moderate irregular stenosis of the P1 segment of the left posterior cerebral artery and there is abrupt occlusion of the P2 segment seen on image 104 of series 16. BASILAR ARTERY: The basilar artery and the cerebellar arteries are patent. OTHER: SOFT TISSUES: No acute finding. No masses or lymphadenopathy. BONES: No acute osseous abnormality. IMPRESSION: 1. Acute evolving nonhemorrhagic infarct in the left occipital lobe. 2. Moderate irregular stenosis of the P1 segment of the left posterior cerebral artery with abrupt occlusion of the P2 segment. 3. Calcific plaque within the carotid bulbs bilaterally with less than 30% stenosis bilaterally. 4. Chronic infarcts in the left basal ganglia and corona radiata. 5. Mild-to-moderate periventricular white matter disease. Electronically signed by: Evalene Coho MD 02/03/2024 10:26 AM EST RP Workstation: [id number]   MR BRAIN WO CONTRAST Result Date: 02/02/2024 EXAM: MRI BRAIN WITHOUT CONTRAST 02/02/2024 01:26:02 PM TECHNIQUE: Multiplanar multisequence MRI of the head/brain was performed without the administration of intravenous contrast. COMPARISON: Same day CT head and MRI head 11/10/2023. CLINICAL HISTORY: Neuro  deficit, acute, stroke suspected. FINDINGS: BRAIN AND VENTRICLES: Moderate to severe chronic microvascular ischemic changes. Similar appearance of generalized parenchymal volume loss. Remote infarcts involving the left basal ganglia and corona radiata as well as the left thalamus. There are scattered areas of restricted diffusion in the left PCA territory primarily involving the cortex and subcortical white matter in the posteromedial left temporal lobe and in the inferior left occipital lobe. There is mild associated edema and sulcal effacement in this region. Few additional small acute versus subacute infarcts in the posterior left MCA territory with involvement of the left  precentral gyrus. Small remote cortical infarcts in the bilateral frontal lobes. Remote infarcts in the right cerebellum. Susceptibility in the left basal ganglia suggestive of remote hemorrhage. No mass. No midline shift. No hydrocephalus. The sella is unremarkable. Normal flow voids. ORBITS: Bilateral lens replacement. No acute abnormality. SINUSES AND MASTOIDS: Mucosal thickening in the left maxillary sinus. No acute abnormality. BONES AND SOFT TISSUES: Normal marrow signal. Right frontal scalp hematoma. Posterior fusion hardware in the upper cervical spine. IMPRESSION: 1. Scattered acute infarcts in the left PCA territory, primarily involving the cortex and subcortical white matter in the posteromedial left temporal lobe and in the inferior left occipital lobe. Mild associated edema and sulcal effacement. No midline shift. 2. Few additional small acute versus subacute infarcts in the posterior left MCA territory with involvement of the left precentral gyrus. 3. Moderate to severe chronic microvascular ischemic changes and generalized parenchymal volume loss. 4. Remote infarcts as above. Electronically signed by: Donnice Mania MD 02/02/2024 01:49 PM EST RP Workstation: [id number]   DG Chest Port 1 View Result Date: 02/02/2024 EXAM: 1  VIEW(S) XRAY OF THE CHEST 02/02/2024 10:24:20 AM COMPARISON: 12/14/2023 CLINICAL HISTORY: syncope vs fall FINDINGS: LUNGS AND PLEURA: No focal pulmonary opacity. No pleural effusion. No pneumothorax. HEART AND MEDIASTINUM: Cardiac valve replacement and left atrial appendage clip noted. Aortic arch calcifications. Post-surgical changes of mediastinum. BONES AND SOFT TISSUES: Sternotomy wires intact. No acute osseous abnormality. IMPRESSION: 1. No acute cardiopulmonary findings. Postoperative changes of cardiac valve replacement and left atrial appendage clip with intact sternotomy wires. Electronically signed by: Evalene Coho MD 02/02/2024 10:49 AM EST RP Workstation: [id number]   CT Cervical Spine Wo Contrast Result Date: 02/02/2024 EXAM: CT CERVICAL SPINE WITHOUT CONTRAST 02/02/2024 10:30:13 AM TECHNIQUE: CT of the cervical spine was performed without the administration of intravenous contrast. Multiplanar reformatted images are provided for review. Automated exposure control, iterative reconstruction, and/or weight based adjustment of the mA/kV was utilized to reduce the radiation dose to as low as reasonably achievable. COMPARISON: CT of the cervical spine dated 02/17/2023. CLINICAL HISTORY: Neck trauma (Age >= 65y). FINDINGS: BONES AND ALIGNMENT: No acute fracture or traumatic malalignment. The patient is status post bilateral posterolateral spinal fusion at C2-C3. DEGENERATIVE CHANGES: Slight degenerative anterolisthesis at C4-C5. Moderate chronic degenerative disc disease at C5-C6 with endplate ridging resulting in mild central spinal canal stenosis and bilateral neural foraminal stenosis. SOFT TISSUES: No prevertebral soft tissue swelling. There are calcifications within the carotid bulbs and carotid arteries bilaterally. LUNGS: Mild emphysematous changes are noted in the lung apices. IMPRESSION: 1. No acute findings. 2. Moderate chronic degenerative disc disease at C5-6 with endplate ridging resulting  in mild central spinal canal stenosis and bilateral neural foraminal stenosis. 3. Slight degenerative anterolisthesis at C4-5. 4. Status post bilateral posterolateral spinal fusion at C2-3. 5. Bilateral carotid artery atherosclerotic calcifications. 6. Mild emphysematous changes in the lung apices. Pulmonary emphysema is an independent risk factor for lung cancer. Recommend consideration for evaluation for a low-dose CT lung cancer screening program. Electronically signed by: Evalene Coho MD 02/02/2024 10:48 AM EST RP Workstation: [id number]   CT Head Wo Contrast Result Date: 02/02/2024 EXAM: CT HEAD WITHOUT CONTRAST 02/02/2024 10:30:13 AM TECHNIQUE: CT of the head was performed without the administration of intravenous contrast. Automated exposure control, iterative reconstruction, and/or weight based adjustment of the mA/kV was utilized to reduce the radiation dose to as low as reasonably achievable. COMPARISON: 11/09/2023 CLINICAL HISTORY: Head trauma, minor (Age >= 65y). FINDINGS: BRAIN AND VENTRICLES: No acute hemorrhage.  New hypodensity in the left occipital lobe compared to prior study. Chronic infarcts in the left basal ganglia and the left thalamus. Moderate chronic microvascular ischemic change, unchanged from prior exam. No hydrocephalus. No extra-axial collection. No mass effect or midline shift. ORBITS: Bilateral lens replacement noted. SINUSES: Chronic appearing opacification of the right sphenoid sinus. SOFT TISSUES AND SKULL: Soft tissue swelling and laceration of the right forehead. No skull fracture. IMPRESSION: 1. New hypodensity in the left occipital lobe compared to prior study, likely representing a subacute to chronic infarct. 2. Chronic infarcts in the left basal ganglia and the left thalamus. 3. Moderate chronic microvascular ischemic change, unchanged from prior exam. 4. Soft tissue swelling and laceration of the right forehead. 5. Chronic appearing opacification of the right sphenoid  sinus. Electronically signed by: Evalene Coho MD 02/02/2024 10:45 AM EST RP Workstation: [id number]      Subjective: [place], wants to go home  Discharge Exam: Vitals:   02/04/24 0435 02/04/24 0739  BP: (!) 153/67 128/73  Pulse: (!) 52 (!) 59  Resp: 16 18  Temp: 98 F (36.7 C) 98 F (36.7 C)  SpO2: 98% 100%    Physical Exam Vitals reviewed.  Constitutional:      General: He is not in acute distress. HENT:     Head: Normocephalic.     Comments: Bandage dressing in place on forehead with no visible bleeding Eyes:     Extraocular Movements: Extraocular movements intact.  Cardiovascular:     Rate and Rhythm: Normal rate and regular rhythm.     Heart sounds: Normal heart sounds.  Pulmonary:     Effort: Pulmonary effort is normal. No respiratory distress.     Breath sounds: Normal breath sounds.  Abdominal:     General: Bowel sounds are normal.     Palpations: Abdomen is soft.     Tenderness: There is no abdominal tenderness. There is no guarding.  Musculoskeletal:     Right lower leg: No edema.     Left lower leg: No edema.  Skin:    General: Skin is warm and dry.  Neurological:     General: No focal deficit present.     Mental Status: He is alert and oriented to person, place, and time.     Cranial Nerves: No cranial nerve deficit.     Sensory: No sensory deficit.     Motor: No weakness.    The results of significant diagnostics from this hospitalization (including imaging, microbiology, ancillary and laboratory) are listed below for reference.     Microbiology: No results found for this or any previous visit (from the past 240 hours).   Labs: BNP (last 3 results) Recent Labs    04/20/23 0943 05/01/23 1104 05/19/23 1042  BNP 2,352.1* 491.3* 669.2*   Basic Metabolic Panel: Recent Labs  Lab 02/02/24 1042 02/02/24 2154 02/03/24 0332 02/04/24 0631  NA 135  --  141 138  K 3.8  --  3.4* 3.5  CL 100  --  105 106  CO2 23  --  20* 23  GLUCOSE 469*   --  169* 111*  BUN 20  --  20 29*  CREATININE 1.40*  --  1.34* 1.53*  CALCIUM  9.2  --  8.9 8.2*  MG  --  2.1  --   --    Liver Function Tests: Recent Labs  Lab 02/02/24 1042  AST 29  ALT 11  ALKPHOS 101  BILITOT 0.4  PROT 7.1  ALBUMIN  3.9  No results for input(s): LIPASE, AMYLASE in the last 168 hours. Recent Labs  Lab 02/02/24 1042  AMMONIA <13   CBC: Recent Labs  Lab 02/02/24 1042 02/03/24 0332  WBC 7.5 10.8*  NEUTROABS 5.4  --   HGB 10.4* 11.5*  HCT 31.1* 34.0*  MCV 90.9 91.9  PLT 149* 164   Cardiac Enzymes: No results for input(s): CKTOTAL, CKMB, CKMBINDEX, TROPONINI in the last 168 hours. BNP: Invalid input(s): POCBNP CBG: Recent Labs  Lab 02/03/24 0624 02/03/24 1105 02/03/24 1704 02/03/24 2159 02/04/24 0605  GLUCAP 157* 304* 191* 265* 145*   D-Dimer No results for input(s): DDIMER in the last 72 hours. Hgb A1c Recent Labs    02/03/24 0332  HGBA1C 8.5*   Lipid Profile Recent Labs    02/03/24 0332  CHOL 129  HDL 77  LDLCALC 37  TRIG 74  CHOLHDL 1.7   Thyroid  function studies No results for input(s): TSH, T4TOTAL, T3FREE, THYROIDAB in the last 72 hours.  Invalid input(s): FREET3 Anemia work up No results for input(s): VITAMINB12, FOLATE, FERRITIN, TIBC, IRON, RETICCTPCT in the last 72 hours. Urinalysis    Component Value Date/Time   COLORURINE STRAW (A) 02/02/2024 1147   APPEARANCEUR CLEAR 02/02/2024 1147   LABSPEC 1.010 02/02/2024 1147   PHURINE 6.5 02/02/2024 1147   GLUCOSEU >=500 (A) 02/02/2024 1147   HGBUR TRACE (A) 02/02/2024 1147   BILIRUBINUR NEGATIVE 02/02/2024 1147   BILIRUBINUR n 09/08/2015 1402   KETONESUR NEGATIVE 02/02/2024 1147   PROTEINUR 100 (A) 02/02/2024 1147   UROBILINOGEN 0.2 09/08/2015 1402   NITRITE NEGATIVE 02/02/2024 1147   LEUKOCYTESUR NEGATIVE 02/02/2024 1147   Sepsis Labs Recent Labs  Lab 02/02/24 1042 02/03/24 0332  WBC 7.5 10.8*   Microbiology No  results found for this or any previous visit (from the past 240 hours).   Time coordinating discharge: 35 minutes  SIGNED:   Ozell Ferrera, MD  [facility] 02/04/2024, 3:25 PM      [1]  Allergies Allergen Reactions   Nubain [Nalbuphine Hcl]     Muscle contraction

## 2024-02-04 NOTE — Care Management Obs Status (Signed)
 MEDICARE OBSERVATION STATUS NOTIFICATION   Patient Details  Name: Michael Bender MRN: [id number] Date of Birth: 01-08-1946   Medicare Observation Status Notification Given:  Yes    Marval Gell, RN 02/04/2024, 7:23 AM

## 2024-02-04 NOTE — TOC Transition Note (Signed)
 Transition of Care [facility]) - Discharge Note   Patient Details  Name: BECKHAM CAPISTRAN MRN: [id number] Date of Birth: 1945-02-12  Transition of Care [facility] CM/SW Contact:  Marval Gell, RN Phone Number: 02/04/2024, 11:05 AM   Clinical Narrative:     Called room spoke with son and directed to call other son [name]. They both agreed that the patient would refuse [facility] services. They would like OP services at [facility] in [place] and would be calling the PCP Monday for that referral. No DME needs, no other ICM needs identified.   Final next level of care: Home/Self Care Barriers to Discharge: No Barriers Identified   Patient Goals and CMS Choice Patient states their goals for this hospitalization and ongoing recovery are:: to go home CMS Medicare.gov Compare Post Acute Care list provided to:: Other (Comment Required) Choice offered to / list presented to : Adult Children      Discharge Placement                       Discharge Plan and Services Additional resources added to the After Visit Summary for                  DME Arranged: N/A         HH Arranged: Patient Refused HH          Social Drivers of Health (SDOH) Interventions SDOH Screenings   Food Insecurity: No Food Insecurity (02/02/2024)  Housing: Low Risk (02/02/2024)  Transportation Needs: No Transportation Needs (02/02/2024)  Utilities: Not At Risk (02/02/2024)  Alcohol  Screen: Low Risk (04/24/2023)  Depression (PHQ2-9): Low Risk (04/19/2023)  Financial Resource Strain: Low Risk (04/24/2023)  Physical Activity: Insufficiently Active (10/13/2022)  Social Connections: Socially Isolated (02/02/2024)  Stress: No Stress Concern Present (10/13/2022)  Tobacco Use: Medium Risk (01/18/2024)  Health Literacy: Inadequate Health Literacy (10/13/2022)     Readmission Risk Interventions    11/15/2023    2:18 PM 11/03/2023    4:23 PM 04/21/2023    4:14 PM  Readmission Risk Prevention Plan  Transportation Screening  Complete Complete Complete  PCP or Specialist Appt within 3-5 Days   Complete  HRI or Home Care Consult   Complete  Social Work Consult for Recovery Care Planning/Counseling   Complete  Palliative Care Screening   Not Applicable  Medication Review Oceanographer)  Complete Complete  PCP or Specialist appointment within 3-5 days of discharge  Complete   HRI or Home Care Consult  Complete   SW Recovery Care/Counseling Consult  Complete   Palliative Care Screening  Not Applicable   Skilled Nursing Facility  Not Applicable

## 2024-02-04 NOTE — Progress Notes (Signed)
 Patient and brother have been provided discharge instructions to include new medications and follow up appointments.

## 2024-02-04 NOTE — Progress Notes (Signed)
 Michael Bender STROKE TEAM PROGRESS NOTE    79 y.o. male with history of Afib but not on AC due to multiple falls and GI bleed who presents to the ED with a fall.   MRI brain with embolic appearing L PCA and L MCA strokes. NIH on Admission 4.   INTERIM HISTORY/SUBJECTIVE Patient is sitting up in bed with his brother at the bedside.  Continues to have right-sided peripheral vision loss.  No other new complaints. CT angiogram shows left P2 occlusion with irregular stenosis of left P1 segment.   OBJECTIVE  CBC    Component Value Date/Time   WBC 10.8 (H) 02/03/2024 0332   RBC 3.70 (L) 02/03/2024 0332   HGB 11.5 (L) 02/03/2024 0332   HGB 11.6 (L) 12/04/2023 1222   HCT 34.0 (L) 02/03/2024 0332   HCT 36.7 (L) 12/04/2023 1222   PLT 164 02/03/2024 0332   PLT 217 12/04/2023 1222   MCV 91.9 02/03/2024 0332   MCV 96 12/04/2023 1222   MCH 31.1 02/03/2024 0332   MCHC 33.8 02/03/2024 0332   RDW 14.6 02/03/2024 0332   RDW 16.2 (H) 12/04/2023 1222   LYMPHSABS 1.4 02/02/2024 1042   LYMPHSABS 2.6 08/14/2023 1037   MONOABS 0.6 02/02/2024 1042   EOSABS 0.1 02/02/2024 1042   EOSABS 0.7 (H) 08/14/2023 1037   BASOSABS 0.0 02/02/2024 1042   BASOSABS 0.1 08/14/2023 1037    BMET    Component Value Date/Time   NA 138 02/04/2024 0631   NA 137 12/26/2023 0851   K 3.5 02/04/2024 0631   CL 106 02/04/2024 0631   CO2 23 02/04/2024 0631   GLUCOSE 111 (H) 02/04/2024 0631   BUN 29 (H) 02/04/2024 0631   BUN 49 (H) 12/26/2023 0851   CREATININE 1.53 (H) 02/04/2024 0631   CREATININE 1.77 (H) 06/29/2023 1126   CREATININE 1.87 (H) 06/21/2023 1209   CALCIUM  8.2 (L) 02/04/2024 0631   CALCIUM  8.1 01/01/2024 0000   EGFR 30 (L) 01/18/2024 0853   GFRNONAA 46 (L) 02/04/2024 0631   GFRNONAA 39 (L) 06/29/2023 1126    IMAGING past 24 hours No results found.   Vitals:   02/03/24 1547 02/03/24 1936 02/04/24 0435 02/04/24 0739  BP: (!) 119/57 (P) 110/60 (!) 153/67 128/73  Pulse: (!) 59 (P) 65 (!) 52 (!) 59  Resp:  16 (P) 14 16 18   Temp: 98.6 F (37 C) (P) 98.1 F (36.7 C) 98 F (36.7 C) 98 F (36.7 C)  TempSrc:  (P) Oral Oral Oral  SpO2: 98% (P) 98% 98% 100%     PHYSICAL EXAM General:  Alert, well-nourished, well-developed patient in no acute distress Psych:  Mood and affect appropriate for situation CV: Regular rate and rhythm on monitor Respiratory:  Regular, unlabored respirations on room air GI: Abdomen soft and nontender   NEURO:  Mental Status: AA&Ox3, patient is able to give clear and coherent history Speech/Language: speech is without dysarthria or aphasia.  Naming, repetition, fluency, and comprehension intact.  Cranial Nerves:  II: PERRL. Visual fields full.  III, IV, VI: EOMI. Eyelids elevate symmetrically.  V: Sensation is intact to light touch and symmetrical to face.  VII: Face is symmetrical resting and smiling VIII: hearing intact to voice. IX, X: Palate elevates symmetrically. Phonation is normal.  KP:Dynloizm shrug 5/5. XII: tongue is midline without fasciculations. Motor: 5/5 strength to all muscle groups tested.  Tone: is normal and bulk is normal Sensation- Intact to light touch bilaterally. Extinction absent to light touch to DSS.  Coordination: FTN intact bilaterally, HKS: no ataxia in BLE.No drift.  Gait- deferred  Most Recent NIH 0.    ASSESSMENT/PLAN  [name] is a 79 y.o. male with history of Afib but not on AC due to multiple falls and GI bleed who presents to the ED with a fall. He does not have a good recollection of events surrounding the fall. MRI brain with embolic appearing L PCA and L MCA strokes. NIH on Admission 4.  Acute Ischemic Infarcts:  left PCA and MCA territory  Etiology:  Afib not on OAC  CT head   New hypodensity in the left occipital lobe compared to prior study, likely representing a subacute to chronic infarct. Chronic infarcts in the left basal ganglia and the left thalamus. Moderate chronic microvascular ischemic  change, unchanged from prior exam. Soft tissue swelling and laceration of the right forehead. Chronic appearing opacification of the right sphenoid sinus. CTA head & neck  Acute evolving nonhemorrhagic infarct in the left occipital lobe. Moderate irregular stenosis of the P1 segment of the left posterior cerebral artery with abrupt occlusion of the P2 segment. Calcific plaque within the carotid bulbs bilaterally with less than 30% stenosis bilaterally. Chronic infarcts in the left basal ganglia and corona radiata. Mild-to-moderate periventricular white matter disease. MRI   Scattered acute infarcts in the left PCA territory, primarily involving the cortex and subcortical white matter in the posteromedial left temporal lobe and in the inferior left occipital lobe. Mild associated edema and sulcal effacement. No midline shift. Few additional small acute versus subacute infarcts in the posterior left MCA territory with involvement of the left precentral gyrus. Moderate to severe chronic microvascular ischemic changes and generalized parenchymal volume loss. 2D Echo repeat not needed this admission due to recent echo and patient is not a candidate for [facility] d/t history of falls and GIB.  12/26/23: EF 60 to 65%, grade 1 diastolic dysfunction, trivial MVR, trivial AVR, noted aortic dilatation LDL 37 HgbA1c 8.5 No antithrombotic prior to admission, now on aspirin  81 mg daily.  Patient not a good long-term anticoagulation candidate due to multiple falls and GI bleed history.  May consider Watchman device as an outpatient  therapy recommendations:  Home Health OT Disposition:  pending  Atrial fibrillation Home Meds: Not on OAC due to history of multiple falls and GI bleed. Continue telemetry monitoring Continue to hold OAC due to noted history.  Patient may benefit from cardiology consult for possible Watchman workup. Will start aspirin  for secondary stroke prevention.   Hx of Stroke Remote infarcts  seen on imaging Patient not an OAC candidate d/t history of falls, GIB Started on ASA  Hypertension CHF, combined Home meds: Lopressor  50 mg twice daily, Entresto  daily Jardiance  25 mg daily, Lasix  40 mg PRN weight gain Stable, on high end Blood Pressure Goal: BP less than 220/110 Permissive hypertension for 24 to 48 hours following acute acute symptom onset.  Then gradually normalize  Hyperlipidemia Home meds: Crestor  40 mg, resumed in hospital LDL 37, goal < 70 Continue statin at discharge  Diabetes type II Uncontrolled Home meds: Trulicity  HgbA1c 8.5, goal < 7.0 CBGs SSI Recommend close follow-up with PCP for better DM control  Tobacco Abuse Former cigarette smoker  Other Stroke Risk Factors Congestive heart failure Migraines Imitrex  home med   Hospital day # 0   Patient presented with a fall and MRI shows embolic left PCA branch infarct.  Patient has history of A-fib but is not a long-term anticoagulation  candidate because of fall risk and GI bleed.  Recommend aspirin  Plavix  for 3 weeks followed by aspirin  alone.  May consider outpatient Watchman device after discussion with his cardiologist.   Patient advised not to drive.  Discussed with patient and brother at the bedside and answered questions.  Stroke team will sign off.  Can you call for questions.  Discussed with Dr.Sigdel.   I personally spent a total of 35 minutes in the care of the patient today including getting/reviewing separately obtained history, performing a medically appropriate exam/evaluation, counseling and educating, placing orders, referring and communicating with other health care professionals, documenting clinical information in the EHR, independently interpreting results, and coordinating care.        Eather Popp, MD Medical Director [facility] Stroke Center Pager: [phone] 02/04/2024 11:16 AM   Pt seen by Neuro NP/APP with MD. Note/plan to be edited by MD as needed.    Rocky JAYSON Likes,  DNP [facility] Please use AMION for contact information & EPIC for messaging.    To contact Stroke Continuity provider, please refer to [email or web address]. After hours, contact General Neurology

## 2024-02-04 NOTE — Plan of Care (Signed)
   Problem: Coping: Goal: Level of anxiety will decrease Outcome: Progressing

## 2024-02-05 ENCOUNTER — Telehealth: Payer: Self-pay

## 2024-02-05 NOTE — Transitions of Care (Post Inpatient/ED Visit) (Signed)
" ° °  02/05/2024  Name: Michael Bender MRN: [id number] DOB: Dec 21, 1945  Today's TOC FU Call Status: Today's TOC FU Call Status:: Unsuccessful Call (1st Attempt) Unsuccessful Call (1st Attempt) Date: 02/05/24  Attempted to reach the patient regarding the most recent Inpatient/ED visit.  Follow Up Plan: Additional outreach attempts will be made to reach the patient to complete the Transitions of Care (Post Inpatient/ED visit) call.   Signature Julian Lemmings, LPN [facility] Nurse Health Advisor Direct Dial [phone]  "

## 2024-02-06 ENCOUNTER — Ambulatory Visit
Admission: RE | Admit: 2024-02-06 | Discharge: 2024-02-06 | Disposition: A | Source: Ambulatory Visit | Attending: Nephrology | Admitting: Nephrology

## 2024-02-06 DIAGNOSIS — N184 Chronic kidney disease, stage 4 (severe): Secondary | ICD-10-CM

## 2024-02-06 NOTE — Transitions of Care (Post Inpatient/ED Visit) (Signed)
 "  02/06/2024  Name: Michael Bender MRN: [id number] DOB: 04/13/1945  Today's TOC FU Call Status: Today's TOC FU Call Status:: Successful TOC FU Call Completed Unsuccessful Call (1st Attempt) Date: 02/05/24 [facility] FU Call Complete Date: 02/06/24  Patient's Name and Date of Birth confirmed. Name, DOB  Transition Care Management Follow-up Telephone Call Date of Discharge: 02/04/24 Discharge Facility: [facility]) Type of Discharge: Inpatient Admission Primary Inpatient Discharge Diagnosis:: cerebral infarction How have you been since you were released from the hospital?: Better Any questions or concerns?: No  Items Reviewed: Any new allergies since your discharge?: No Dietary orders reviewed?: Yes Do you have support at home?: Yes People in Home [RPT]: child(ren), adult  Medications Reviewed Today: Medications Reviewed Today     Reviewed by [name], LPN (Licensed Practical Nurse) on 02/06/24 at 1152  Med List Status: <None>   Medication Order Taking? Sig Documenting Provider Last Dose Status Informant  ACCU-CHEK GUIDE TEST test strip [id number] Yes 1 each by Other route 4 (four) times daily. [provider]  Active Child  albuterol  (VENTOLIN  HFA) 108 (90 Base) MCG/ACT inhaler [id number] Yes Inhale 2 puffs into the lungs every 6 (six) hours as needed for wheezing or shortness of breath. [name], MD  Active Child  amitriptyline  (ELAVIL ) 50 MG tablet [id number] Yes Take 50 mg by mouth at bedtime. [provider]  Active Child  aspirin  EC 81 MG tablet [id number] Yes Take 1 tablet (81 mg total) by mouth daily. Swallow whole. Sigdel, Santosh, MD  Active   B Complex-C (B-COMPLEX WITH VITAMIN C) tablet [id number]  Take 1 tablet by mouth daily.  Patient not taking: Reported on 02/06/2024   [name], NP  Active Child  bisacodyl  5 MG EC tablet [id number] Yes Take 1 tablet (5 mg total) by mouth daily as needed for moderate constipation. [name], MD   Active Child  Blood Glucose Monitoring Suppl (ACCU-CHEK GUIDE ME) w/Device KIT [id number] Yes Inject 1 Application into the skin 4 (four) times daily. [provider]  Active Child  Blood Glucose Monitoring Suppl DEVI [id number] Yes 1 each by Does not apply route in the morning, at noon, and at bedtime. May substitute to any manufacturer covered by patient's insurance. [name], MD  Active Child  clopidogrel  (PLAVIX ) 75 MG tablet [id number] Yes Take 1 tablet (75 mg total) by mouth daily. Sigdel, Santosh, MD  Active   Continuous Glucose Sensor (FREESTYLE LIBRE 3 PLUS SENSOR) MISC [id number]  Change sensor every 15 days.  Patient not taking: Reported on 02/06/2024   [name], MD  Active Child  Doxepin  HCl 6 MG TABS [id number] Yes Take 1 tablet (6 mg total) by mouth at bedtime. [name], MD  Active Child  Dulaglutide  (TRULICITY ) 0.75 MG/0.5ML EMMANUEL [id number]  Inject 0.75 mg into the skin once a week. Replaces ozempic   Patient not taking: Reported on 02/06/2024   [name], MD  Active Child  empagliflozin  (JARDIANCE ) 25 MG TABS tablet [id number] Yes Take 1 tablet (25 mg total) by mouth daily. [name], NP  Active Child  ENTRESTO  24-26 MG [id number] Yes Take 1 tablet by mouth 2 (two) times daily. [provider]  Active Child  furosemide  (LASIX ) 40 MG tablet [id number] Yes Take 1 tablet (40 mg total) by mouth daily.  Patient taking differently: Take 40 mg by mouth daily. TAKE 40 MG (1 TABLET) AS NEEDED FOR WEIGHT GAIN OF 3 POUNDS IN ONE NIGHT  OR 5 POUNDS IN 1 WEEK.   [name], NP  Active Child  ipratropium-albuterol  (DUONEB) 0.5-2.5 (3) MG/3ML SOLN [id number] Yes Take 3 mLs by nebulization every 4 (four) hours as needed. [name], MD  Active Child           Med Note RANELLE KEENE HERO   Mon May 01, 2023 10:15 AM)    Lemborexant  (DAYVIGO ) 5 MG TABS [id number] Yes Take 1 tablet (5 mg total) by mouth at bedtime. [name], MD  Active  Child  levETIRAcetam  (KEPPRA ) 500 MG tablet [id number]  Take 1 tablet (500 mg total) by mouth 2 (two) times daily.  Patient not taking: Reported on 02/06/2024   [name], PA-C  Active Child  magnesium  gluconate (MAGONATE) 500 (27 Mg) MG TABS tablet [id number]  Take 0.5 tablets (250 mg total) by mouth at bedtime.  Patient not taking: Reported on 02/06/2024   [name], NP  Active Child  metoprolol  tartrate (LOPRESSOR ) 50 MG tablet [id number]  Take 1 tablet (50 mg total) by mouth 2 (two) times daily.  Patient not taking: Reported on 02/06/2024   Zimmerman, [name], PA-C  Active Child           Med Note [name], SUZEN CROME   Fri Feb 02, 2024  4:38 PM) Not sure if still taking this or Amlodine, Son will check meds at home.  Multiple Vitamin (MULTIVITAMIN WITH MINERALS) TABS tablet [id number]  Take 1 tablet by mouth daily.  Patient not taking: Reported on 02/06/2024   Zimmerman, [name], PA-C  Active Child  Oxycodone  HCl 10 MG TABS [id number] Yes Take 1 tablet (10 mg total) by mouth every 8 (eight) hours as needed. [name], MD  Active Child  pantoprazole  (PROTONIX ) 40 MG tablet [id number] Yes Take 1 tablet (40 mg total) by mouth daily. [name], MD  Active Child  polyethylene glycol (MIRALAX  / GLYCOLAX ) 17 g packet [id number] Yes Take 17 g by mouth daily as needed for moderate constipation. Cheryle Page, MD  Active Child  ramelteon  (ROZEREM ) 8 MG tablet [id number] Yes Take 1 tablet (8 mg total) by mouth at bedtime. [name], MD  Active Child  Respiratory Therapy Supplies (NEBULIZER/TUBING/MOUTHPIECE) KIT [id number] Yes 1 each by Does not apply route every 4 (four) hours as needed. [name], MD  Active Child  rosuvastatin  (CRESTOR ) 40 MG tablet [id number] Yes TAKE 1 TABLET BY MOUTH DAILY. REPLACES ATORVASTATIN  (STOP ATORVASTATIN  IF STILL TAKING) [name], MD  Active Child  spironolactone  (ALDACTONE ) 25 MG tablet [id number]  Take 25 mg by mouth 2  (two) times daily.  Patient not taking: Reported on 02/06/2024   [provider]  Active Child  SUMAtriptan  (IMITREX ) 50 MG tablet [id number] Yes TAKE 1 TABLET (50 MG TOTAL) BY MOUTH DAILY. MAY REPEAT IN 2 HOURS IF HEADACHE PERSISTS OR RECURS. [name], MD  Active Child  vitamin D3 (CHOLECALCIFEROL ) 25 MCG tablet [id number] Yes Take 4 tablets (4,000 Units total) by mouth daily. [name], NP  Active Child  Med List Note Deleta Debby SAUNDERS, [facility] 11/02/23 9085): [place] Son [phone]                   Home Care and Equipment/Supplies: Were Home Health Services Ordered?: NA Any new equipment or medical supplies ordered?: NA  Functional Questionnaire: Do you need assistance with bathing/showering or dressing?: No Do you need assistance with meal preparation?: No Do you  need assistance with eating?: No Do you have difficulty maintaining continence: No Do you need assistance with getting out of bed/getting out of a chair/moving?: No Do you have difficulty managing or taking your medications?: No  Follow up appointments reviewed: PCP Follow-up appointment confirmed?: Yes Date of PCP follow-up appointment?: 02/13/24 Follow-up Provider: [facility] Follow-up appointment confirmed?: NA Do you need transportation to your follow-up appointment?: No Do you understand care options if your condition(s) worsen?: Yes-patient verbalized understanding    SIGNATURE Julian Lemmings, LPN [facility] Nurse Health Advisor Direct Dial [phone]  "

## 2024-02-07 ENCOUNTER — Inpatient Hospital Stay

## 2024-02-07 ENCOUNTER — Inpatient Hospital Stay: Admitting: Internal Medicine

## 2024-02-13 ENCOUNTER — Encounter: Payer: Self-pay | Admitting: Internal Medicine

## 2024-02-13 ENCOUNTER — Ambulatory Visit: Admitting: Internal Medicine

## 2024-02-13 VITALS — BP 160/90 | HR 84 | Temp 98.0°F | Ht 70.0 in | Wt 141.0 lb

## 2024-02-13 DIAGNOSIS — I159 Secondary hypertension, unspecified: Secondary | ICD-10-CM | POA: Diagnosis not present

## 2024-02-13 DIAGNOSIS — S0101XA Laceration without foreign body of scalp, initial encounter: Secondary | ICD-10-CM

## 2024-02-13 DIAGNOSIS — Z4802 Encounter for removal of sutures: Secondary | ICD-10-CM

## 2024-02-13 DIAGNOSIS — R9431 Abnormal electrocardiogram [ECG] [EKG]: Secondary | ICD-10-CM

## 2024-02-13 DIAGNOSIS — Z5189 Encounter for other specified aftercare: Secondary | ICD-10-CM | POA: Diagnosis not present

## 2024-02-13 DIAGNOSIS — Z8673 Personal history of transient ischemic attack (TIA), and cerebral infarction without residual deficits: Secondary | ICD-10-CM

## 2024-02-13 DIAGNOSIS — E1122 Type 2 diabetes mellitus with diabetic chronic kidney disease: Secondary | ICD-10-CM

## 2024-02-13 DIAGNOSIS — E1165 Type 2 diabetes mellitus with hyperglycemia: Secondary | ICD-10-CM | POA: Diagnosis not present

## 2024-02-13 DIAGNOSIS — N184 Chronic kidney disease, stage 4 (severe): Secondary | ICD-10-CM | POA: Diagnosis not present

## 2024-02-13 DIAGNOSIS — S0101XD Laceration without foreign body of scalp, subsequent encounter: Secondary | ICD-10-CM

## 2024-02-13 MED ORDER — CLOPIDOGREL BISULFATE 75 MG PO TABS: 75.0000 mg | ORAL_TABLET | Freq: Every day | ORAL | 3 refills | Status: AC

## 2024-02-13 NOTE — Assessment & Plan Note (Signed)
"  Blood pressure was elevated during hospitalization and is currently slightly elevated. Blood pressure medication was not taken this morning. Reassess antihypertensive regimen and monitor blood pressure regularly. Close follow up planned. Cfup "

## 2024-02-13 NOTE — Patient Instructions (Addendum)
 Amitriptyline  50 mg has conditional risk for torsades de pointes (TdP) and is specifically listed among drugs to avoid in congenital long QT syndrome. [1] Tricyclic antidepressants cause significant QTc prolongation and are associated with ventricular arrhythmias. [2] The QTc prolongation correlates with serum concentrations, particularly of its active metabolite nortriptyline. [3-4] This medication poses the greatest concern on this list.  Doxepin  6 mg also carries conditional risk for TdP. [1] While this is a low dose used for insomnia rather than depression, it still has QT-prolonging potential as a tricyclic antidepressant.   Try to stop these 2 medications unless you absolutely must have for sleep.   It was a pleasure seeing you today! Your health and satisfaction are our top priorities.  Bernardino Cone, MD  VISIT SUMMARY: During your visit, we discussed your recent head injury from a fall on February 02, 2024, and reviewed your current health status, including your high blood sugar levels, high blood pressure, and prolonged QT interval. We also addressed your history of gastrointestinal bleeding and chronic kidney disease. Your stitches were removed today, and we applied Steri-Strips for additional support.  YOUR PLAN: -CEREBRAL INFARCTION: A recent CT scan suggested possible new strokes, but you have no clinical symptoms. High blood sugar levels may have contributed. We prescribed Plavix  and continued aspirin  therapy to reduce stroke risk. We also discussed the Watchman device with a cardiologist for stroke prevention. Please monitor for signs of stroke or bleeding.  -LONG QT SYNDROME: A prolonged QT interval on your EKG may contribute to cardiac events. We have discontinued amitriptyline  and doxepin , which were identified as QT prolonging. We will continue to monitor your QT interval and cardiac function.  -TYPE 2 DIABETES MELLITUS: Your significantly elevated blood sugar levels at the  time of the fall may have contributed to dehydration and passing out. Tighter control of your blood sugar is necessary. Please continue to monitor your blood glucose levels closely and we will adjust your diabetes management plan as needed.  -CHRONIC KIDNEY DISEASE: Your kidney function was well-managed during your recent hospitalization. We will continue your current management and monitoring.  -SCALP LACERATION: You sustained a scalp laceration from your fall. Your stitches were removed today, and we applied Steri-Strips for additional support for 3-7 days. Please monitor for signs of infection or wound reopening.  -SECONDARY HYPERTENSION: Your blood pressure was elevated during your hospitalization and is currently slightly elevated. You did not take your blood pressure medication this morning. We will reassess your blood pressure medication regimen and monitor your blood pressure regularly.  INSTRUCTIONS: Please follow up with your cardiologist to discuss the Watchman device for stroke prevention. Continue to monitor your blood glucose levels closely and adjust your diabetes management plan as needed. Monitor for signs of stroke, bleeding, infection, or wound reopening. Reassess your blood pressure medication regimen and monitor your blood pressure regularly.  Your Providers PCP: Cone Bernardino MATSU, MD,  [phone]) Referring Provider: Cone Bernardino MATSU, MD,  [phone]) Care Team Provider: Evonnie Asberry RAMAN, DO,  [phone]) Care Team Provider: Fleeta Rothman, Jomarie SAILOR, MD,  [phone]) Care Team Provider: Avram Lupita BRAVO, MD,  [phone]) Care Team Provider: Okey Vina GAILS, MD,  [phone]) Care Team Provider: [name], NP,  [phone])  NEXT STEPS: [x]  Early Intervention: Schedule sooner appointment, call our on-call services, or go to emergency room if there is any significant Increase in pain or discomfort New or worsening symptoms Sudden or severe changes in  your health [x]  Flexible Follow-Up: We recommend a  No follow-ups on file. for optimal routine care. This allows for progress monitoring and treatment adjustments. [x]  Preventive Care: Schedule your annual preventive care visit! It's typically covered by insurance and helps identify potential health issues early. [x]  Lab & X-ray Appointments: Incomplete tests scheduled today, or call to schedule. X-rays: [facility] (M-F, 8:30am-noon or 1pm-5pm). [x]  Medical Information Release: Sign a release form at front desk to obtain relevant medical information we don't have.  MAKING THE MOST OF OUR FOCUSED 20 MINUTE APPOINTMENTS: [x]   Clearly state your top concerns at the beginning of the visit to focus our discussion [x]   If you anticipate you will need more time, please inform the front desk during scheduling - we can book multiple appointments in the same week. [x]   If you have transportation problems- use our convenient video appointments or ask about transportation support. [x]   We can get down to business faster if you use MyChart to update information before the visit and submit non-urgent questions before your visit. Thank you for taking the time to provide details through MyChart.  Let our nurse know and she can import this information into your encounter documents.  Arrival and Wait Times: [x]   Arriving on time ensures that everyone receives prompt attention. [x]   Early morning (8a) and afternoon (1p) appointments tend to have shortest wait times. [x]   Unfortunately, we cannot delay appointments for late arrivals or hold slots during phone calls.  Getting Answers and Following Up [x]   Simple Questions & Concerns: For quick questions or basic follow-up after your visit, reach us  at [phone] or MyChart messaging. [x]   Complex Concerns: If your concern is more complex, scheduling an appointment might be best. Discuss this with the staff to find the most suitable option. [x]    Lab & Imaging Results: We'll contact you directly if results are abnormal or you don't use MyChart. Most normal results will be on MyChart within 2-3 business days, with a review message from Dr. Jesus. Haven't heard back in 2 weeks? Need results sooner? Contact us  at [phone]. [x]   Referrals: Our referral coordinator will manage specialist referrals. The specialist's office should contact you within 2 weeks to schedule an appointment. Call us  if you haven't heard from them after 2 weeks.  Staying Connected [x]   MyChart: Activate your MyChart for the fastest way to access results and message us . See the last page of this paperwork for instructions on how to activate.  Bring to Your Next Appointment [x]   Medications: Please bring all your medication bottles to your next appointment to ensure we have an accurate record of your prescriptions. [x]   Health Diaries: If you're monitoring any health conditions at home, keeping a diary of your readings can be very helpful for discussions at your next appointment.  Billing [x]   X-ray & Lab Orders: These are billed by separate companies. Contact the invoicing company directly for questions or concerns. [x]   Visit Charges: Discuss any billing inquiries with our administrative services team.  Your Satisfaction Matters [x]   Share Your Experience: We strive for your satisfaction! If you have any complaints, or preferably compliments, please let Dr. Jesus know directly or contact our Practice Administrators, Manuelita Rubin or [employer], by asking at the front desk.   Reviewing Your Records [x]   Review this early draft of your clinical encounter notes below and the final encounter summary tomorrow on MyChart after its been completed.  All orders placed so far are visible here: Encounter for  removal of sutures  Encounter for wound care  Stroke, recent, without late effect  Long QT interval  Type 2 diabetes mellitus with hyperglycemia,  without long-term current use of insulin  (HCC)  Chronic kidney disease, stage 4 (severe) (HCC)  Laceration of scalp, subsequent encounter  Secondary hypertension  Other orders -     Clopidogrel  Bisulfate; Take 1 tablet (75 mg total) by mouth daily.  Dispense: 90 tablet; Refill: 3    Future Appointments  Date Time Provider Department Center  03/15/2024 10:00 AM [name], MD [facility]  04/18/2024  9:15 AM [name], NP [facility]

## 2024-02-13 NOTE — Assessment & Plan Note (Signed)
 Kidney function was well-managed during recent hospitalization. Continue current management and monitoring.

## 2024-02-13 NOTE — Assessment & Plan Note (Signed)
 Significantly elevated blood sugar levels at the time of the fall may have contributed to dehydration and syncope. Tighter glycemic control is necessary. Continue to monitor blood glucose levels closely and adjust the diabetes management plan.

## 2024-02-13 NOTE — Progress Notes (Signed)
 ==============================  [facility]: [phone]   -- Medical Office Visit --  Patient: Michael Bender      Age: 79 y.o.       Sex:  male  Date:   02/13/2024 Today's Healthcare Provider: Bernardino KANDICE Cone, MD  ==============================   Chief Complaint: Hospitalization Follow-up (Remove stitches today on forehead 10 days In from having them fell at home )  Discussed the use of AI scribe software for clinical note transcription with the patient, who gave verbal consent to proceed. History of Present Illness 79 year old male who presents with a head injury following a fall.  He experienced a fall on February 02, 2024, resulting in a head injury after hitting his head on a wood stove. He is uncertain whether he tripped or passed out, but suspects passing out due to high blood sugar levels, which were recorded at 500 mg/dL on the day of the incident. He underwent various scans, including x-rays and MRIs, during his hospital stay. Stitches were placed for the head injury and were removed on February 12, 2024.  A CT scan was performed during his hospital stay, and he was told there were some new spots seen. However, he has not noticed any changes in his ability to walk or use his hands and is getting around better now than a few weeks ago. He was informed during his hospital stay that he had high blood pressure and a prolonged QT interval.  He has a history of high blood sugar, which he attributes to consuming Christmas treats. He is attempting to eat healthier and manage his sugar intake, although he has a sweet tooth and neighbors who may not be aware of his dietary restrictions.  He has a history of gastrointestinal bleeding and is currently not on any blood thinners. He has been in and out of the hospital. He has support from family and neighbors.  Lab Results  Component Value Date   HGBA1C 8.5 (H) 02/03/2024   HGBA1C 7.7 (H) 11/12/2023    HGBA1C 8.7 (H) 11/02/2023   HGBA1C 8.4 (H) 10/12/2023   HGBA1C 6.5 (H) 11/08/2022   HGBA1C 6.4 05/03/2022   HGBA1C 5.1 11/20/2018   Background Reviewed: Problem List: has GERD; CONSTIPATION, CHRONIC; Primary hypertension; Bilateral leg pain; Esophageal dysphagia; Insomnia; Peripheral arterial disease; History of CVA (cerebrovascular accident); Anemia; HLD (hyperlipidemia); COPD (chronic obstructive pulmonary disease) (HCC); Iron deficiency anemia; Duodenal stenosis; Opioid dependence (HCC); Atherosclerosis of aorta; High risk medication use; Gynecomastia, male; Chronic bilateral low back pain with bilateral sciatica; Acute kidney injury superimposed on chronic kidney disease; Neck pain; Memory loss; Frequent falls; Nonintractable headache; Balance problems; Chronic heart failure with preserved ejection fraction (HFpEF, >= 50%) (HCC); Elevated troponin; Orthostatic hypotension; Chronic kidney disease (CKD), active medical management without dialysis, stage 3 (moderate) (HCC); Elevated BUN; Severe aortic regurgitation; Postlaminectomy syndrome, not elsewhere classified; Fecal incontinence alternating with constipation; Sleep disturbance; Diabetes mellitus, new onset (HCC); Chronic kidney disease, stage 4 (severe) (HCC); Restless legs; Hyperkalemia; SOB (shortness of breath); Protein-calorie malnutrition, severe; History of aortic valve replacement with porcine valve; Weakness; Type 2 diabetes mellitus with hyperglycemia, without long-term current use of insulin  (HCC); Frailty; Medication management; Acute CVA (cerebrovascular accident) (HCC); and Hyperglycemia on their problem list. Past Medical History:  has a past medical history of Acute combined systolic and diastolic heart failure (HCC) (96/77/7974), Acute on chronic diastolic CHF (congestive heart failure) (HCC) (04/20/2023), AKI (acute kidney injury) (10/28/2022), Arthritis, Balance problems (02/21/2023),  Blood transfusion, Cachexia (05/03/2022),  Cervical discitis (11/10/2022), Chronic back pain, COPD (chronic obstructive pulmonary disease) (HCC), Disturbance of skin sensation (05/22/2018), Effusion of right knee (02/21/2023), Effusion of right knee joint (01/16/2023), Elevated troponin (04/22/2023), Essential hypertension (03/05/2007), Finger pain, left (11/08/2022), Gastric erosion, Gastritis and gastroduodenitis, Gastrointestinal hemorrhage with melena (11/20/2018), GIB (gastrointestinal bleeding) (07/18/2019), Hardware complicating wound infection (11/10/2022), Headache(784.0), Hemorrhoids, internal, High risk medication use (03/03/2022), History of duodenal ulcer, History of fusion of cervical spine (03/03/2022), History of lumbar fusion (03/03/2022), History of upper gastrointestinal bleeding (03/03/2022), HLD (hyperlipidemia), Hypertension, Hypokalemia (04/22/2023), Hypomagnesemia (04/22/2023), Infected blister of left index finger (11/07/2022), Intractable pain (03/03/2022), Loss of weight, MSSA bacteremia (11/07/2022), Neck rigidity, Nocturia, PAIN, CHRONIC NEC (10/06/2006), Pneumonia, Prostate disease, Right sided temporal headache (05/22/2018), S/P cervical spinal fusion (03/03/2022), Senile ecchymosis (01/21/2019), Septic arthritis of wrist, left (HCC) (11/08/2022), Septic infrapatellar bursitis of right knee (11/07/2022), Staphylococcal arthritis of left wrist (HCC) (11/07/2022), Staphylococcal arthritis of right knee (HCC) (11/08/2022), Syncope (11/05/2022), and Underweight on examination (05/03/2022). Past Surgical History:   has a past surgical history that includes Cervical fusion (1992); Spinal fusion (05/06/11); Colonoscopy; Esophagogastroduodenoscopy (07/20/2011); Savory dilation (07/20/2011); biopsy (07/19/2019); Esophagogastroduodenoscopy (egd) with propofol  (N/A, 07/19/2019); I & D extremity (Left, 11/09/2022); TRANSESOPHAGEAL ECHOCARDIOGRAM (N/A, 07/24/2023); RIGHT/LEFT HEART CATH AND CORONARY ANGIOGRAPHY (N/A, 11/06/2023); Aortic valve  replacement (N/A, 11/08/2023); Clipping of atrial appendage (N/A, 11/08/2023); and Intraoprative transesophageal echocardiogram (N/A, 11/08/2023). Social History:   reports that he quit smoking about 5 years ago. His smoking use included cigarettes. He started smoking about 45 years ago. He has never used smokeless tobacco. He reports that he does not currently use alcohol  after a past usage of about 2.0 standard drinks of alcohol  per week. He reports that he does not use drugs. Family History:  family history includes Heart attack (age of onset: 51) in his mother; Heart disease in his mother; Pancreatic cancer in his brother; Pancreatic cancer (age of onset: 33) in his father. Allergies:  is allergic to nubain [nalbuphine hcl].   Medication Reconciliation: Current Outpatient Medications on File Prior to Visit  Medication Sig   ACCU-CHEK GUIDE TEST test strip 1 each by Other route 4 (four) times daily.   albuterol  (VENTOLIN  HFA) 108 (90 Base) MCG/ACT inhaler Inhale 2 puffs into the lungs every 6 (six) hours as needed for wheezing or shortness of breath.   amitriptyline  (ELAVIL ) 50 MG tablet Take 50 mg by mouth at bedtime.   aspirin  EC 81 MG tablet Take 1 tablet (81 mg total) by mouth daily. Swallow whole.   B Complex-C (B-COMPLEX WITH VITAMIN C) tablet Take 1 tablet by mouth daily. (Patient not taking: Reported on 02/06/2024)   bisacodyl  5 MG EC tablet Take 1 tablet (5 mg total) by mouth daily as needed for moderate constipation.   Blood Glucose Monitoring Suppl (ACCU-CHEK GUIDE ME) w/Device KIT Inject 1 Application into the skin 4 (four) times daily.   Blood Glucose Monitoring Suppl DEVI 1 each by Does not apply route in the morning, at noon, and at bedtime. May substitute to any manufacturer covered by patient's insurance.   Continuous Glucose Sensor (FREESTYLE LIBRE 3 PLUS SENSOR) MISC Change sensor every 15 days. (Patient not taking: Reported on 02/06/2024)   Doxepin  HCl 6 MG TABS Take 1 tablet (6 mg  total) by mouth at bedtime.   Dulaglutide  (TRULICITY ) 0.75 MG/0.5ML SOAJ Inject 0.75 mg into the skin once a week. Replaces ozempic  (Patient not taking: Reported on 02/06/2024)   empagliflozin  (JARDIANCE ) 25 MG TABS  tablet Take 1 tablet (25 mg total) by mouth daily.   ENTRESTO  24-26 MG Take 1 tablet by mouth 2 (two) times daily.   furosemide  (LASIX ) 40 MG tablet Take 1 tablet (40 mg total) by mouth daily. (Patient taking differently: Take 40 mg by mouth daily. TAKE 40 MG (1 TABLET) AS NEEDED FOR WEIGHT GAIN OF 3 POUNDS IN ONE NIGHT OR 5 POUNDS IN 1 WEEK.)   ipratropium-albuterol  (DUONEB) 0.5-2.5 (3) MG/3ML SOLN Take 3 mLs by nebulization every 4 (four) hours as needed.   Lemborexant  (DAYVIGO ) 5 MG TABS Take 1 tablet (5 mg total) by mouth at bedtime.   levETIRAcetam  (KEPPRA ) 500 MG tablet Take 1 tablet (500 mg total) by mouth 2 (two) times daily. (Patient not taking: Reported on 02/06/2024)   magnesium  gluconate (MAGONATE) 500 (27 Mg) MG TABS tablet Take 0.5 tablets (250 mg total) by mouth at bedtime. (Patient not taking: Reported on 02/06/2024)   metoprolol  tartrate (LOPRESSOR ) 50 MG tablet Take 1 tablet (50 mg total) by mouth 2 (two) times daily. (Patient not taking: Reported on 02/06/2024)   Multiple Vitamin (MULTIVITAMIN WITH MINERALS) TABS tablet Take 1 tablet by mouth daily. (Patient not taking: Reported on 02/06/2024)   Oxycodone  HCl 10 MG TABS Take 1 tablet (10 mg total) by mouth every 8 (eight) hours as needed.   pantoprazole  (PROTONIX ) 40 MG tablet Take 1 tablet (40 mg total) by mouth daily.   polyethylene glycol (MIRALAX  / GLYCOLAX ) 17 g packet Take 17 g by mouth daily as needed for moderate constipation.   ramelteon  (ROZEREM ) 8 MG tablet Take 1 tablet (8 mg total) by mouth at bedtime.   Respiratory Therapy Supplies (NEBULIZER/TUBING/MOUTHPIECE) KIT 1 each by Does not apply route every 4 (four) hours as needed.   rosuvastatin  (CRESTOR ) 40 MG tablet TAKE 1 TABLET BY MOUTH DAILY. REPLACES ATORVASTATIN   (STOP ATORVASTATIN  IF STILL TAKING)   spironolactone  (ALDACTONE ) 25 MG tablet Take 25 mg by mouth 2 (two) times daily. (Patient not taking: Reported on 02/06/2024)   SUMAtriptan  (IMITREX ) 50 MG tablet TAKE 1 TABLET (50 MG TOTAL) BY MOUTH DAILY. MAY REPEAT IN 2 HOURS IF HEADACHE PERSISTS OR RECURS.   vitamin D3 (CHOLECALCIFEROL ) 25 MCG tablet Take 4 tablets (4,000 Units total) by mouth daily.   No current facility-administered medications on file prior to visit.   Medications Discontinued During This Encounter  Medication Reason   clopidogrel  (PLAVIX ) 75 MG tablet Reorder     Physical Exam:    02/13/2024   10:01 AM 02/13/2024    9:44 AM 02/04/2024    7:39 AM  Vitals with BMI  Height  5' 10   Weight  141 lbs   BMI  20.23   Systolic 160 160 871  Diastolic 90 110 73  Pulse  84 59  Vital signs reviewed.  Nursing notes reviewed. Weight trend reviewed. Physical Activity: Insufficiently Active (10/13/2022)   Exercise Vital Sign    Days of Exercise per Week: 3 days    Minutes of Exercise per Session: 30 min   General Appearance:  No acute distress appreciable.   Well-groomed, healthy-appearing male.  Well proportioned with no abnormal fat distribution.  Good muscle tone. Pulmonary:  Normal work of breathing at rest, no respiratory distress apparent. SpO2: 98 %  Musculoskeletal: All extremities are intact.  Neurological:  Awake, alert, oriented, and engaged.  No obvious focal neurological deficits or cognitive impairments.  Sensorium seems unclouded.   Speech is clear and coherent with logical content. Psychiatric:  Appropriate  mood, pleasant and cooperative demeanor, thoughtful and engaged during the exam  Verbalized to patient: Physical Exam HEENT: Stitches removed from head wound, no signs of infection.  Results Labs Blood glucose (02/02/2024): 500 CBC (02/02/2024): Mildly decreased blood counts Renal function panel (02/02/2024): Stable kidney function Infectious markers  (02/02/2024): No evidence of infection  Radiology Head CT (02/02/2024): Findings suspicious for acute cerebral infarcts  Diagnostic EKG (02/02/2024): Markedly prolonged QT interval  Suture removal, scalp laceration Nine scalp sutures removed from laceration site. Wound without signs of infection. Steri-Strips applied for reinforcement. One small piece of blue suture left in place to avoid unnecessary pain, to be allowed to fall off naturally. Wound edges approximated, mild tenderness noted, no dehiscence.  Procedure Note: Suture Removal Indication: Removal of sutures from forehead laceration  Consent: Verbal consent obtained. Risks (bleeding, wound dehiscence, discomfort, infection) and benefits discussed. Procedure: The wound at right forehead was inspected and noted to be well-approximated/healing appropriately with no signs of infection (no erythema, warmth, drainage, fluctuance). The area was cleansed with an alcohol  swab. Nine (9) sutures were removed using standard sterile technique. Wound edges remained well-approximated after removal. The site was reinforced with five (5) Steri-Strips. Patient tolerated the procedure well with no complications. Estimated Blood Loss: None Complications: None Post-Procedure Care / Instructions: Keep Steri-Strips in place; allow them to fall off naturally (typically 3-7 days). Keep area clean and dry for 24 hours, then may gently wash; avoid soaking/submersion until fully healed. Return precautions: increasing redness, swelling, warmth, drainage, fever, worsening pain, or wound opening. Disposition: Stable_toggle; follow up PRN / in [x days] for wound check if needed.     04/19/2023   10:47 AM 03/22/2023    9:40 AM 02/17/2023   11:42 AM 01/17/2023   10:13 AM  PHQ 2/9 Scores  PHQ - 2 Score 0 0 2 0  PHQ- 9 Score  0  3       Data saved with a previous flowsheet row definition    Admission on 02/02/2024, Discharged on 02/04/2024  Component  Date Value Ref Range Status   WBC 02/02/2024 7.5  4.0 - 10.5 K/uL Final   RBC 02/02/2024 3.42 (L)  4.22 - 5.81 MIL/uL Final   Hemoglobin 02/02/2024 10.4 (L)  13.0 - 17.0 g/dL Final   HCT 98/97/7973 31.1 (L)  39.0 - 52.0 % Final   MCV 02/02/2024 90.9  80.0 - 100.0 fL Final   MCH 02/02/2024 30.4  26.0 - 34.0 pg Final   MCHC 02/02/2024 33.4  30.0 - 36.0 g/dL Final   RDW 98/97/7973 14.5  11.5 - 15.5 % Final   Platelets 02/02/2024 149 (L)  150 - 400 K/uL Final   nRBC 02/02/2024 0.0  0.0 - 0.2 % Final   Neutrophils Relative % 02/02/2024 71  % Final   Neutro Abs 02/02/2024 5.4  1.7 - 7.7 K/uL Final   Lymphocytes Relative 02/02/2024 19  % Final   Lymphs Abs 02/02/2024 1.4  0.7 - 4.0 K/uL Final   Monocytes Relative 02/02/2024 8  % Final   Monocytes Absolute 02/02/2024 0.6  0.1 - 1.0 K/uL Final   Eosinophils Relative 02/02/2024 1  % Final   Eosinophils Absolute 02/02/2024 0.1  0.0 - 0.5 K/uL Final   Basophils Relative 02/02/2024 1  % Final   Basophils Absolute 02/02/2024 0.0  0.0 - 0.1 K/uL Final   Immature Granulocytes 02/02/2024 0  % Final   Abs Immature Granulocytes 02/02/2024 0.02  0.00 - 0.07 K/uL Final  Sodium 02/02/2024 135  135 - 145 mmol/L Final   Potassium 02/02/2024 3.8  3.5 - 5.1 mmol/L Final   Chloride 02/02/2024 100  98 - 111 mmol/L Final   CO2 02/02/2024 23  22 - 32 mmol/L Final   Glucose, Bld 02/02/2024 469 (H)  70 - 99 mg/dL Final   BUN 98/97/7973 20  8 - 23 mg/dL Final   Creatinine, Ser 02/02/2024 1.40 (H)  0.61 - 1.24 mg/dL Final   Calcium  02/02/2024 9.2  8.9 - 10.3 mg/dL Final   Total Protein 98/97/7973 7.1  6.5 - 8.1 g/dL Final   Albumin  02/02/2024 3.9  3.5 - 5.0 g/dL Final   AST 98/97/7973 29  15 - 41 U/L Final   ALT 02/02/2024 11  0 - 44 U/L Final   Alkaline Phosphatase 02/02/2024 101  38 - 126 U/L Final   Total Bilirubin 02/02/2024 0.4  0.0 - 1.2 mg/dL Final   GFR, Estimated 02/02/2024 51 (L)  >60 mL/min Final   Anion gap 02/02/2024 12  5 - 15 Final   Troponin T  High Sensitivity 02/02/2024 82 (H)  0 - 19 ng/L Final   Ammonia 02/02/2024 <13  9 - 35 umol/L Final   Color, Urine 02/02/2024 STRAW (A)  YELLOW Final   APPearance 02/02/2024 CLEAR  CLEAR Final   Specific Gravity, Urine 02/02/2024 1.010  1.005 - 1.030 Final   pH 02/02/2024 6.5  5.0 - 8.0 Final   Glucose, UA 02/02/2024 >=500 (A)  NEGATIVE mg/dL Final   Hgb urine dipstick 02/02/2024 TRACE (A)  NEGATIVE Final   Bilirubin Urine 02/02/2024 NEGATIVE  NEGATIVE Final   Ketones, ur 02/02/2024 NEGATIVE  NEGATIVE mg/dL Final   Protein, ur 98/97/7973 100 (A)  NEGATIVE mg/dL Final   Nitrite 98/97/7973 NEGATIVE  NEGATIVE Final   Leukocytes,Ua 02/02/2024 NEGATIVE  NEGATIVE Final   Opiates 02/02/2024 NEGATIVE  NEGATIVE Final   Cocaine 02/02/2024 NEGATIVE  NEGATIVE Final   Benzodiazepines 02/02/2024 NEGATIVE  NEGATIVE Final   Amphetamines 02/02/2024 NEGATIVE  NEGATIVE Final   Tetrahydrocannabinol 02/02/2024 NEGATIVE  NEGATIVE Final   Barbiturates 02/02/2024 NEGATIVE  NEGATIVE Final   Methadone Scn, Ur 02/02/2024 NEGATIVE  NEGATIVE Final   Fentanyl  02/02/2024 NEGATIVE  NEGATIVE Final   RBC / HPF 02/02/2024 0-5  0 - 5 RBC/hpf Final   WBC, UA 02/02/2024 0-5  0 - 5 WBC/hpf Final   Bacteria, UA 02/02/2024 NONE SEEN  NONE SEEN Final   Squamous Epithelial / HPF 02/02/2024 0-5  0 - 5 /HPF Final   Glucose-Capillary 02/02/2024 240 (H)  70 - 99 mg/dL Final   Comment 1 98/97/7973 Notify RN   Final   Comment 2 02/02/2024 Document in Chart   Final   Cholesterol 02/03/2024 129  0 - 200 mg/dL Final   Triglycerides 98/96/7973 74  <150 mg/dL Final   HDL 98/96/7973 77  >40 mg/dL Final   Total CHOL/HDL Ratio 02/03/2024 1.7  RATIO Final   VLDL 02/03/2024 15  0 - 40 mg/dL Final   LDL Cholesterol 02/03/2024 37  0 - 99 mg/dL Final   WBC 98/96/7973 10.8 (H)  4.0 - 10.5 K/uL Final   RBC 02/03/2024 3.70 (L)  4.22 - 5.81 MIL/uL Final   Hemoglobin 02/03/2024 11.5 (L)  13.0 - 17.0 g/dL Final   HCT 98/96/7973 34.0 (L)  39.0 -  52.0 % Final   MCV 02/03/2024 91.9  80.0 - 100.0 fL Final   MCH 02/03/2024 31.1  26.0 - 34.0 pg Final  MCHC 02/03/2024 33.8  30.0 - 36.0 g/dL Final   RDW 98/96/7973 14.6  11.5 - 15.5 % Final   Platelets 02/03/2024 164  150 - 400 K/uL Final   nRBC 02/03/2024 0.0  0.0 - 0.2 % Final   Sodium 02/03/2024 141  135 - 145 mmol/L Final   Potassium 02/03/2024 3.4 (L)  3.5 - 5.1 mmol/L Final   Chloride 02/03/2024 105  98 - 111 mmol/L Final   CO2 02/03/2024 20 (L)  22 - 32 mmol/L Final   Glucose, Bld 02/03/2024 169 (H)  70 - 99 mg/dL Final   BUN 98/96/7973 20  8 - 23 mg/dL Final   Creatinine, Ser 02/03/2024 1.34 (H)  0.61 - 1.24 mg/dL Final   Calcium  02/03/2024 8.9  8.9 - 10.3 mg/dL Final   GFR, Estimated 02/03/2024 54 (L)  >60 mL/min Final   Anion gap 02/03/2024 16 (H)  5 - 15 Final   Magnesium  02/02/2024 2.1  1.7 - 2.4 mg/dL Final   Troponin T High Sensitivity 02/02/2024 83 (H)  0 - 19 ng/L Final   Glucose-Capillary 02/03/2024 157 (H)  70 - 99 mg/dL Final   Hgb J8r MFr Bld 02/03/2024 8.5 (H)  4.8 - 5.6 % Final   Mean Plasma Glucose 02/03/2024 197.25  mg/dL Final   Glucose-Capillary 02/03/2024 304 (H)  70 - 99 mg/dL Final   Comment 1 98/96/7973 Notify RN   Final   Glucose-Capillary 02/03/2024 191 (H)  70 - 99 mg/dL Final   Sodium 98/95/7973 138  135 - 145 mmol/L Final   Potassium 02/04/2024 3.5  3.5 - 5.1 mmol/L Final   Chloride 02/04/2024 106  98 - 111 mmol/L Final   CO2 02/04/2024 23  22 - 32 mmol/L Final   Glucose, Bld 02/04/2024 111 (H)  70 - 99 mg/dL Final   BUN 98/95/7973 29 (H)  8 - 23 mg/dL Final   Creatinine, Ser 02/04/2024 1.53 (H)  0.61 - 1.24 mg/dL Final   Calcium  02/04/2024 8.2 (L)  8.9 - 10.3 mg/dL Final   GFR, Estimated 02/04/2024 46 (L)  >60 mL/min Final   Anion gap 02/04/2024 10  5 - 15 Final   Glucose-Capillary 02/03/2024 265 (H)  70 - 99 mg/dL Final   Glucose-Capillary 02/04/2024 145 (H)  70 - 99 mg/dL Final  Office Visit on 01/18/2024  Component Date Value Ref Range  Status   Sodium 01/18/2024 140  135 - 145 mEq/L Final   Potassium 01/18/2024 3.7  3.5 - 5.1 mEq/L Final   Chloride 01/18/2024 107  96 - 112 mEq/L Final   CO2 01/18/2024 25  19 - 32 mEq/L Final   Glucose, Bld 01/18/2024 205 (H)  70 - 99 mg/dL Final   BUN 87/81/7974 21  6 - 23 mg/dL Final   Creatinine, Ser 01/18/2024 1.51 (H)  0.40 - 1.50 mg/dL Final   GFR 87/81/7974 43.77 (L)  >60.00 mL/min Final   Calcium  01/18/2024 8.2 (L)  8.4 - 10.5 mg/dL Final   CYSTATIN C 87/81/7974 1.95 (H)  0.52 - 1.16 mg/L Final   eGFR 01/18/2024 30 (L)  >=60 mL/min/1.29m2 Final  Office Visit on 01/17/2024  Component Date Value Ref Range Status   NT-Pro BNP 01/18/2024 7,929 (H)  0 - 486 pg/mL Final  Hospital Outpatient Visit on 12/26/2023  Component Date Value Ref Range Status   Area-P 1/2 12/26/2023 2.44  cm2 Final   S' Lateral 12/26/2023 2.95  cm Final   AV Area mean vel 12/26/2023 1.92  cm2 Final   AR max vel 12/26/2023 1.93  cm2 Final   AV Area VTI 12/26/2023 2.16  cm2 Final   Ao pk vel 12/26/2023 2.00  m/s Final   AV Mean grad 12/26/2023 8.3  mmHg Final   AV Peak grad 12/26/2023 16.0  mmHg Final   Radius 12/26/2023 0.47  cm Final   MV M vel 12/26/2023 3.84  m/s Final   MV Peak grad 12/26/2023 59.0  mmHg Final   Est EF 12/26/2023 60 - 65%   Final  Results Follow-Up on 12/25/2023  Component Date Value Ref Range Status   Glucose 12/26/2023 175 (H)  70 - 99 mg/dL Final   BUN 88/74/7974 49 (H)  8 - 27 mg/dL Final   Creatinine, Ser 12/26/2023 2.83 (H)  0.76 - 1.27 mg/dL Final   eGFR 88/74/7974 22 (L)  >59 mL/min/1.73 Final   BUN/Creatinine Ratio 12/26/2023 17  10 - 24 Final   Sodium 12/26/2023 137  134 - 144 mmol/L Final   Potassium 12/26/2023 4.0  3.5 - 5.2 mmol/L Final   Chloride 12/26/2023 101  96 - 106 mmol/L Final   CO2 12/26/2023 23  20 - 29 mmol/L Final   Calcium  12/26/2023 8.4 (L)  8.6 - 10.2 mg/dL Final  Admission on 88/85/7974, Discharged on 12/15/2023  Component Date Value Ref Range Status    Sodium 12/15/2023 136  135 - 145 mmol/L Final   Potassium 12/15/2023 4.9  3.5 - 5.1 mmol/L Final   Chloride 12/15/2023 99  98 - 111 mmol/L Final   CO2 12/15/2023 23  22 - 32 mmol/L Final   Glucose, Bld 12/15/2023 158 (H)  70 - 99 mg/dL Final   BUN 88/85/7974 59 (H)  8 - 23 mg/dL Final   Creatinine, Ser 12/15/2023 2.65 (H)  0.61 - 1.24 mg/dL Final   Calcium  12/15/2023 9.1  8.9 - 10.3 mg/dL Final   GFR, Estimated 12/15/2023 24 (L)  >60 mL/min Final   Anion gap 12/15/2023 14  5 - 15 Final   WBC 12/15/2023 8.9  4.0 - 10.5 K/uL Final   RBC 12/15/2023 4.10 (L)  4.22 - 5.81 MIL/uL Final   Hemoglobin 12/15/2023 12.3 (L)  13.0 - 17.0 g/dL Final   HCT 88/85/7974 37.3 (L)  39.0 - 52.0 % Final   MCV 12/15/2023 91.0  80.0 - 100.0 fL Final   MCH 12/15/2023 30.0  26.0 - 34.0 pg Final   MCHC 12/15/2023 33.0  30.0 - 36.0 g/dL Final   RDW 88/85/7974 16.5 (H)  11.5 - 15.5 % Final   Platelets 12/15/2023 166  150 - 400 K/uL Final   nRBC 12/15/2023 0.0  0.0 - 0.2 % Final   Neutrophils Relative % 12/15/2023 75  % Final   Neutro Abs 12/15/2023 6.6  1.7 - 7.7 K/uL Final   Lymphocytes Relative 12/15/2023 16  % Final   Lymphs Abs 12/15/2023 1.4  0.7 - 4.0 K/uL Final   Monocytes Relative 12/15/2023 9  % Final   Monocytes Absolute 12/15/2023 0.8  0.1 - 1.0 K/uL Final   Eosinophils Relative 12/15/2023 0  % Final   Eosinophils Absolute 12/15/2023 0.0  0.0 - 0.5 K/uL Final   Basophils Relative 12/15/2023 0  % Final   Basophils Absolute 12/15/2023 0.0  0.0 - 0.1 K/uL Final   Immature Granulocytes 12/15/2023 0  % Final   Abs Immature Granulocytes 12/15/2023 0.03  0.00 - 0.07 K/uL Final  Orders Only on 12/11/2023  Component Date Value Ref Range  Status   Glucose 12/14/2023 204 (H)  70 - 99 mg/dL Final   BUN 88/86/7974 66 (H)  8 - 27 mg/dL Final   Creatinine, Ser 12/14/2023 2.71 (H)  0.76 - 1.27 mg/dL Final   eGFR 88/86/7974 23 (L)  >59 mL/min/1.73 Final   BUN/Creatinine Ratio 12/14/2023 24  10 - 24 Final    Sodium 12/14/2023 142  134 - 144 mmol/L Final   Potassium 12/14/2023 5.4 (H)  3.5 - 5.2 mmol/L Final   Chloride 12/14/2023 100  96 - 106 mmol/L Final   CO2 12/14/2023 21  20 - 29 mmol/L Final   Calcium  12/14/2023 9.1  8.6 - 10.2 mg/dL Final  Office Visit on 12/04/2023  Component Date Value Ref Range Status   NT-Pro BNP 12/04/2023 4,915 (H)  0 - 486 pg/mL Final   Glucose 12/04/2023 250 (H)  70 - 99 mg/dL Final   BUN 88/96/7974 41 (H)  8 - 27 mg/dL Final   Creatinine, Ser 12/04/2023 1.76 (H)  0.76 - 1.27 mg/dL Final   eGFR 88/96/7974 39 (L)  >59 mL/min/1.73 Final   BUN/Creatinine Ratio 12/04/2023 23  10 - 24 Final   Sodium 12/04/2023 140  134 - 144 mmol/L Final   Potassium 12/04/2023 5.0  3.5 - 5.2 mmol/L Final   Chloride 12/04/2023 104  96 - 106 mmol/L Final   CO2 12/04/2023 21  20 - 29 mmol/L Final   Calcium  12/04/2023 8.9  8.6 - 10.2 mg/dL Final   WBC 88/96/7974 9.2  3.4 - 10.8 x10E3/uL Final   RBC 12/04/2023 3.83 (L)  4.14 - 5.80 x10E6/uL Final   Hemoglobin 12/04/2023 11.6 (L)  13.0 - 17.7 g/dL Final   Hematocrit 88/96/7974 36.7 (L)  37.5 - 51.0 % Final   MCV 12/04/2023 96  79 - 97 fL Final   MCH 12/04/2023 30.3  26.6 - 33.0 pg Final   MCHC 12/04/2023 31.6  31.5 - 35.7 g/dL Final   RDW 88/96/7974 16.2 (H)  11.6 - 15.4 % Final   Platelets 12/04/2023 217  150 - 450 x10E3/uL Final   PTH 01/01/2024 45   Final   EGFR 01/01/2024 26.0   Final   Calcium  01/01/2024 8.1   Final  Admission on 11/15/2023, Discharged on 11/21/2023  Component Date Value Ref Range Status   Sodium 11/16/2023 139  135 - 145 mmol/L Final   Potassium 11/16/2023 3.5  3.5 - 5.1 mmol/L Final   Chloride 11/16/2023 105  98 - 111 mmol/L Final   CO2 11/16/2023 22  22 - 32 mmol/L Final   Glucose, Bld 11/16/2023 158 (H)  70 - 99 mg/dL Final   BUN 89/83/7974 31 (H)  8 - 23 mg/dL Final   Creatinine, Ser 11/16/2023 1.58 (H)  0.61 - 1.24 mg/dL Final   Calcium  11/16/2023 8.2 (L)  8.9 - 10.3 mg/dL Final   Total Protein  11/16/2023 6.3 (L)  6.5 - 8.1 g/dL Final   Albumin  11/16/2023 2.8 (L)  3.5 - 5.0 g/dL Final   AST 89/83/7974 101 (H)  15 - 41 U/L Final   ALT 11/16/2023 34  0 - 44 U/L Final   Alkaline Phosphatase 11/16/2023 98  38 - 126 U/L Final   Total Bilirubin 11/16/2023 0.8  0.0 - 1.2 mg/dL Final   GFR, Estimated 11/16/2023 44 (L)  >60 mL/min Final   Anion gap 11/16/2023 12  5 - 15 Final   WBC 11/16/2023 6.5  4.0 - 10.5 K/uL Final   RBC 11/16/2023 4.00 (  L)  4.22 - 5.81 MIL/uL Final   Hemoglobin 11/16/2023 11.6 (L)  13.0 - 17.0 g/dL Final   HCT 89/83/7974 36.1 (L)  39.0 - 52.0 % Final   MCV 11/16/2023 90.3  80.0 - 100.0 fL Final   MCH 11/16/2023 29.0  26.0 - 34.0 pg Final   MCHC 11/16/2023 32.1  30.0 - 36.0 g/dL Final   RDW 89/83/7974 16.7 (H)  11.5 - 15.5 % Final   Platelets 11/16/2023 164  150 - 400 K/uL Final   nRBC 11/16/2023 0.0  0.0 - 0.2 % Final   Neutrophils Relative % 11/16/2023 75  % Final   Neutro Abs 11/16/2023 4.8  1.7 - 7.7 K/uL Final   Lymphocytes Relative 11/16/2023 16  % Final   Lymphs Abs 11/16/2023 1.0  0.7 - 4.0 K/uL Final   Monocytes Relative 11/16/2023 8  % Final   Monocytes Absolute 11/16/2023 0.5  0.1 - 1.0 K/uL Final   Eosinophils Relative 11/16/2023 0  % Final   Eosinophils Absolute 11/16/2023 0.0  0.0 - 0.5 K/uL Final   Basophils Relative 11/16/2023 0  % Final   Basophils Absolute 11/16/2023 0.0  0.0 - 0.1 K/uL Final   Immature Granulocytes 11/16/2023 1  % Final   Abs Immature Granulocytes 11/16/2023 0.06  0.00 - 0.07 K/uL Final   Glucose-Capillary 11/15/2023 180 (H)  70 - 99 mg/dL Final   Glucose-Capillary 11/15/2023 260 (H)  70 - 99 mg/dL Final   Comment 1 89/84/7974 Notify RN   Final   Glucose-Capillary 11/16/2023 154 (H)  70 - 99 mg/dL Final   Comment 1 89/83/7974 Notify RN   Final   Vit D, 25-Hydroxy 11/16/2023 16.82 (L)  30 - 100 ng/mL Final   Magnesium  11/16/2023 2.1  1.7 - 2.4 mg/dL Final   Glucose-Capillary 11/16/2023 178 (H)  70 - 99 mg/dL Final    Glucose-Capillary 11/16/2023 227 (H)  70 - 99 mg/dL Final   Glucose-Capillary 11/16/2023 206 (H)  70 - 99 mg/dL Final   Glucose-Capillary 11/17/2023 128 (H)  70 - 99 mg/dL Final   Glucose-Capillary 11/17/2023 230 (H)  70 - 99 mg/dL Final   Glucose-Capillary 11/17/2023 299 (H)  70 - 99 mg/dL Final   Glucose-Capillary 11/17/2023 208 (H)  70 - 99 mg/dL Final   Glucose-Capillary 11/18/2023 167 (H)  70 - 99 mg/dL Final   Glucose-Capillary 11/18/2023 201 (H)  70 - 99 mg/dL Final   Glucose-Capillary 11/18/2023 484 (H)  70 - 99 mg/dL Final   Glucose-Capillary 11/18/2023 420 (H)  70 - 99 mg/dL Final   Glucose-Capillary 11/18/2023 147 (H)  70 - 99 mg/dL Final   Glucose-Capillary 11/19/2023 199 (H)  70 - 99 mg/dL Final   Glucose-Capillary 11/19/2023 300 (H)  70 - 99 mg/dL Final   Glucose-Capillary 11/19/2023 267 (H)  70 - 99 mg/dL Final   Glucose-Capillary 11/19/2023 211 (H)  70 - 99 mg/dL Final   Sodium 89/79/7974 137  135 - 145 mmol/L Final   Potassium 11/20/2023 3.8  3.5 - 5.1 mmol/L Final   Chloride 11/20/2023 104  98 - 111 mmol/L Final   CO2 11/20/2023 23  22 - 32 mmol/L Final   Glucose, Bld 11/20/2023 170 (H)  70 - 99 mg/dL Final   BUN 89/79/7974 34 (H)  8 - 23 mg/dL Final   Creatinine, Ser 11/20/2023 1.80 (H)  0.61 - 1.24 mg/dL Final   Calcium  11/20/2023 8.3 (L)  8.9 - 10.3 mg/dL Final   GFR, Estimated 11/20/2023 38 (  L)  >60 mL/min Final   Anion gap 11/20/2023 10  5 - 15 Final   WBC 11/20/2023 9.0  4.0 - 10.5 K/uL Final   RBC 11/20/2023 3.44 (L)  4.22 - 5.81 MIL/uL Final   Hemoglobin 11/20/2023 10.5 (L)  13.0 - 17.0 g/dL Final   HCT 89/79/7974 31.2 (L)  39.0 - 52.0 % Final   MCV 11/20/2023 90.7  80.0 - 100.0 fL Final   MCH 11/20/2023 30.5  26.0 - 34.0 pg Final   MCHC 11/20/2023 33.7  30.0 - 36.0 g/dL Final   RDW 89/79/7974 16.7 (H)  11.5 - 15.5 % Final   Platelets 11/20/2023 376  150 - 400 K/uL Final   nRBC 11/20/2023 0.0  0.0 - 0.2 % Final   Glucose-Capillary 11/19/2023 267 (H)  70 -  99 mg/dL Final   Glucose-Capillary 11/20/2023 161 (H)  70 - 99 mg/dL Final   Glucose-Capillary 11/20/2023 229 (H)  70 - 99 mg/dL Final   Glucose-Capillary 11/20/2023 167 (H)  70 - 99 mg/dL Final   Glucose-Capillary 11/20/2023 302 (H)  70 - 99 mg/dL Final   Glucose-Capillary 11/21/2023 208 (H)  70 - 99 mg/dL Final  No results displayed because visit has over 200 results.    There may be more visits with results that are not included.  No image results found. CT ANGIO HEAD NECK W WO CM Result Date: 02/03/2024 EXAM: CTA Head and Neck with Intravenous Contrast. CT Head without Contrast. CLINICAL HISTORY: Stroke/TIA, determine embolic source. TECHNIQUE: Axial CTA images of the head and neck performed with intravenous contrast. MIP reconstructed images were created and reviewed. Axial computed tomography images of the head/brain performed without intravenous contrast. Note: Per PQRS, the description of internal carotid artery percent stenosis, including 0 percent or normal exam, is based on [employer] Symptomatic Carotid Endarterectomy Trial (NASCET) criteria. Dose reduction technique was used including one or more of the following: automated exposure control, adjustment of mA and kV according to patient size, and/or iterative reconstruction. CONTRAST: With and without IV contrast. 75 mL (iohexol  (OMNIPAQUE ) 350 MG/ML injection 75 mL IOHEXOL  350 MG/ML SOLN). COMPARISON: MRI of the head dated 02/02/2024 and CT of the head dated 02/02/2024. FINDINGS: CT HEAD: BRAIN: Persistent hypodensity within the medial aspect of the left occipital lobe. Chronic infarcts are demonstrated within the left basal ganglia and coronal radiata. There is mild-to-moderate periventricular white matter disease. No acute intraparenchymal hemorrhage. No mass lesion. No CT evidence for acute territorial infarct. No midline shift or extra-axial collection. VENTRICLES: No hydrocephalus. ORBITS: The orbits are unremarkable. SINUSES AND  MASTOIDS: The paranasal sinuses and mastoid air cells are clear. CTA NECK: VASCULATURE (Aortic Arch): The aortic arch demonstrates moderate calcific atheromatous disease. COMMON CAROTID ARTERIES: There is calcific plaque at the origin of the left common carotid artery, causing approximately 20% luminal stenosis. No dissection or occlusion. INTERNAL CAROTID ARTERIES: There is calcific plaque within the carotid bulbs bilaterally, with less than 30% stenosis bilaterally. No dissection or occlusion. VERTEBRAL ARTERIES: The vertebral arteries are codominant and demonstrate no significant stenosis. No dissection or occlusion. CTA HEAD: INTERNAL CAROTID ARTERIES (intracranial segment): There is mild calcific plaque within the carotid siphons, but no appreciable stenosis. ANTERIOR CEREBRAL ARTERIES: No significant stenosis. No occlusion. No aneurysm. MIDDLE CEREBRAL ARTERIES: No significant stenosis. No occlusion. No aneurysm. POSTERIOR CEREBRAL ARTERIES: There is moderate irregular stenosis of the P1 segment of the left posterior cerebral artery and there is abrupt occlusion of the P2 segment seen on image  104 of series 16. BASILAR ARTERY: The basilar artery and the cerebellar arteries are patent. OTHER: SOFT TISSUES: No acute finding. No masses or lymphadenopathy. BONES: No acute osseous abnormality. IMPRESSION: 1. Acute evolving nonhemorrhagic infarct in the left occipital lobe. 2. Moderate irregular stenosis of the P1 segment of the left posterior cerebral artery with abrupt occlusion of the P2 segment. 3. Calcific plaque within the carotid bulbs bilaterally with less than 30% stenosis bilaterally. 4. Chronic infarcts in the left basal ganglia and corona radiata. 5. Mild-to-moderate periventricular white matter disease. Electronically signed by: Evalene Coho MD 02/03/2024 10:26 AM EST RP Workstation: [id number]   MR BRAIN WO CONTRAST Result Date: 02/02/2024 EXAM: MRI BRAIN WITHOUT CONTRAST 02/02/2024 01:26:02 PM  TECHNIQUE: Multiplanar multisequence MRI of the head/brain was performed without the administration of intravenous contrast. COMPARISON: Same day CT head and MRI head 11/10/2023. CLINICAL HISTORY: Neuro deficit, acute, stroke suspected. FINDINGS: BRAIN AND VENTRICLES: Moderate to severe chronic microvascular ischemic changes. Similar appearance of generalized parenchymal volume loss. Remote infarcts involving the left basal ganglia and corona radiata as well as the left thalamus. There are scattered areas of restricted diffusion in the left PCA territory primarily involving the cortex and subcortical white matter in the posteromedial left temporal lobe and in the inferior left occipital lobe. There is mild associated edema and sulcal effacement in this region. Few additional small acute versus subacute infarcts in the posterior left MCA territory with involvement of the left precentral gyrus. Small remote cortical infarcts in the bilateral frontal lobes. Remote infarcts in the right cerebellum. Susceptibility in the left basal ganglia suggestive of remote hemorrhage. No mass. No midline shift. No hydrocephalus. The sella is unremarkable. Normal flow voids. ORBITS: Bilateral lens replacement. No acute abnormality. SINUSES AND MASTOIDS: Mucosal thickening in the left maxillary sinus. No acute abnormality. BONES AND SOFT TISSUES: Normal marrow signal. Right frontal scalp hematoma. Posterior fusion hardware in the upper cervical spine. IMPRESSION: 1. Scattered acute infarcts in the left PCA territory, primarily involving the cortex and subcortical white matter in the posteromedial left temporal lobe and in the inferior left occipital lobe. Mild associated edema and sulcal effacement. No midline shift. 2. Few additional small acute versus subacute infarcts in the posterior left MCA territory with involvement of the left precentral gyrus. 3. Moderate to severe chronic microvascular ischemic changes and generalized  parenchymal volume loss. 4. Remote infarcts as above. Electronically signed by: Donnice Mania MD 02/02/2024 01:49 PM EST RP Workstation: [id number]   DG Chest Port 1 View Result Date: 02/02/2024 EXAM: 1 VIEW(S) XRAY OF THE CHEST 02/02/2024 10:24:20 AM COMPARISON: 12/14/2023 CLINICAL HISTORY: syncope vs fall FINDINGS: LUNGS AND PLEURA: No focal pulmonary opacity. No pleural effusion. No pneumothorax. HEART AND MEDIASTINUM: Cardiac valve replacement and left atrial appendage clip noted. Aortic arch calcifications. Post-surgical changes of mediastinum. BONES AND SOFT TISSUES: Sternotomy wires intact. No acute osseous abnormality. IMPRESSION: 1. No acute cardiopulmonary findings. Postoperative changes of cardiac valve replacement and left atrial appendage clip with intact sternotomy wires. Electronically signed by: Evalene Coho MD 02/02/2024 10:49 AM EST RP Workstation: [id number]   CT Cervical Spine Wo Contrast Result Date: 02/02/2024 EXAM: CT CERVICAL SPINE WITHOUT CONTRAST 02/02/2024 10:30:13 AM TECHNIQUE: CT of the cervical spine was performed without the administration of intravenous contrast. Multiplanar reformatted images are provided for review. Automated exposure control, iterative reconstruction, and/or weight based adjustment of the mA/kV was utilized to reduce the radiation dose to as low as reasonably achievable. COMPARISON: CT of the cervical spine dated  02/17/2023. CLINICAL HISTORY: Neck trauma (Age >= 65y). FINDINGS: BONES AND ALIGNMENT: No acute fracture or traumatic malalignment. The patient is status post bilateral posterolateral spinal fusion at C2-C3. DEGENERATIVE CHANGES: Slight degenerative anterolisthesis at C4-C5. Moderate chronic degenerative disc disease at C5-C6 with endplate ridging resulting in mild central spinal canal stenosis and bilateral neural foraminal stenosis. SOFT TISSUES: No prevertebral soft tissue swelling. There are calcifications within the carotid bulbs and carotid  arteries bilaterally. LUNGS: Mild emphysematous changes are noted in the lung apices. IMPRESSION: 1. No acute findings. 2. Moderate chronic degenerative disc disease at C5-6 with endplate ridging resulting in mild central spinal canal stenosis and bilateral neural foraminal stenosis. 3. Slight degenerative anterolisthesis at C4-5. 4. Status post bilateral posterolateral spinal fusion at C2-3. 5. Bilateral carotid artery atherosclerotic calcifications. 6. Mild emphysematous changes in the lung apices. Pulmonary emphysema is an independent risk factor for lung cancer. Recommend consideration for evaluation for a low-dose CT lung cancer screening program. Electronically signed by: Evalene Coho MD 02/02/2024 10:48 AM EST RP Workstation: [id number]   CT Head Wo Contrast Result Date: 02/02/2024 EXAM: CT HEAD WITHOUT CONTRAST 02/02/2024 10:30:13 AM TECHNIQUE: CT of the head was performed without the administration of intravenous contrast. Automated exposure control, iterative reconstruction, and/or weight based adjustment of the mA/kV was utilized to reduce the radiation dose to as low as reasonably achievable. COMPARISON: 11/09/2023 CLINICAL HISTORY: Head trauma, minor (Age >= 65y). FINDINGS: BRAIN AND VENTRICLES: No acute hemorrhage. New hypodensity in the left occipital lobe compared to prior study. Chronic infarcts in the left basal ganglia and the left thalamus. Moderate chronic microvascular ischemic change, unchanged from prior exam. No hydrocephalus. No extra-axial collection. No mass effect or midline shift. ORBITS: Bilateral lens replacement noted. SINUSES: Chronic appearing opacification of the right sphenoid sinus. SOFT TISSUES AND SKULL: Soft tissue swelling and laceration of the right forehead. No skull fracture. IMPRESSION: 1. New hypodensity in the left occipital lobe compared to prior study, likely representing a subacute to chronic infarct. 2. Chronic infarcts in the left basal ganglia and the left  thalamus. 3. Moderate chronic microvascular ischemic change, unchanged from prior exam. 4. Soft tissue swelling and laceration of the right forehead. 5. Chronic appearing opacification of the right sphenoid sinus. Electronically signed by: Evalene Coho MD 02/02/2024 10:45 AM EST RP Workstation: [id number]   ECHOCARDIOGRAM COMPLETE Result Date: 12/26/2023    ECHOCARDIOGRAM REPORT   Patient Name:   OAKES MCCREADY Date of Exam: 12/26/2023 Medical Rec #:  [id number]        Height:       71.0 in Accession #:    [id number]       Weight:       132.0 lb Date of Birth:  28-Nov-1945       BSA:          1.767 m Patient Age:    78 years         BP:           102/68 mmHg Patient Gender: M                HR:           52 bpm. Exam Location:  [address] Procedure: 2D Echo, 3D Echo, Cardiac Doppler and Color Doppler (Both Spectral            and Color Flow Doppler were utilized during procedure). Indications:    Z95.2 Status Post Aortic Valve Replacement  History:  Patient has prior history of Echocardiogram examinations, most                 recent 11/02/2023. CHF, CAD, COPD, Arrythmias:Atrial                 Fibrillation, Signs/Symptoms:Dizziness/Lightheadedness, Murmur,                 Syncope and Shortness of Breath; Risk Factors:Dyslipidemia,                 Hypertension, Family History of Coronary Artery Disease and                 Former Smoker. 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                 Aortic Valve: 27 mm Edwards Inspiris Resilia valve is present in                 the aortic position. Procedure Date: 11/08/23.  Sonographer:    Heather Hawks RDCS Referring Phys: Uriah Trueba G Shali Vesey IMPRESSIONS  1. Left ventricular ejection fraction, by estimation, is 60 to 65%. The left ventricle has normal function. The left ventricle has no regional wall motion abnormalities. Left  ventricular diastolic parameters are consistent with Grade I diastolic dysfunction (impaired relaxation).  2. Right ventricular systolic function is normal. The right ventricular size is normal.  3. The mitral valve is normal in structure. Trivial mitral valve regurgitation. No evidence of mitral stenosis.  4. The aortic valve has been repaired/replaced. Aortic valve regurgitation is trivial. No aortic stenosis is present. There is a 27 mm Edwards Inspiris Resilia valve present in the aortic position. Procedure Date: 11/08/23.  5. Aortic dilatation noted. There is borderline dilatation of the ascending aorta, measuring 39 mm.  6. The inferior vena cava is normal in size with greater than 50% respiratory variability, suggesting right atrial pressure of 3 mmHg. FINDINGS  Left Ventricle: Left ventricular ejection fraction, by estimation, is 60 to 65%. The left ventricle has normal function. The left ventricle has no regional wall motion abnormalities. The left ventricular internal cavity size was normal in size. There is  no left ventricular hypertrophy. Left ventricular diastolic parameters are consistent with Grade I diastolic dysfunction (impaired relaxation). Right Ventricle: The right ventricular size is normal. Right ventricular systolic function is normal. Left Atrium: Left atrial size was normal in size. Right Atrium: Right atrial size was normal in size. Pericardium: There is no evidence of pericardial effusion. Mitral Valve: The mitral valve is normal in structure. Trivial mitral valve regurgitation. No evidence of mitral valve stenosis. Tricuspid Valve: The tricuspid valve is normal in structure. Tricuspid valve regurgitation is trivial. No evidence of tricuspid stenosis. Aortic Valve: The aortic valve has been repaired/replaced. Aortic valve regurgitation is trivial. No aortic stenosis is present. Aortic valve mean gradient measures 8.2 mmHg. Aortic valve peak gradient measures 16.0 mmHg. Aortic valve area,  by VTI measures 2.16 cm. There is a 27 mm Edwards Inspiris Resilia valve present in the aortic position. Procedure Date: 11/08/23. Pulmonic Valve: The pulmonic valve was normal in structure. Pulmonic valve regurgitation is trivial. No evidence of pulmonic stenosis. Aorta: Aortic dilatation noted. There is borderline dilatation of the ascending aorta,  measuring 39 mm. Venous: The inferior vena cava is normal in size with greater than 50% respiratory variability, suggesting right atrial pressure of 3 mmHg. IAS/Shunts: No atrial level shunt detected by color flow Doppler. Additional Comments: 3D was performed not requiring image post processing on an independent workstation and was normal.  LEFT VENTRICLE PLAX 2D LVIDd:         4.20 cm   Diastology LVIDs:         2.95 cm   LV e' medial:    6.86 cm/s LV PW:         1.00 cm   LV E/e' medial:  10.1 LV IVS:        1.10 cm   LV e' lateral:   8.16 cm/s LVOT diam:     2.40 cm   LV E/e' lateral: 8.5 LV SV:         100 LV SV Index:   56 LVOT Area:     4.52 cm LV IVRT:       82 msec                          3D Volume EF:                          3D EF:        68 %                          LV EDV:       148 ml                          LV ESV:       47 ml                          LV SV:        101 ml RIGHT VENTRICLE RV Basal diam:  4.00 cm     PULMONARY VEINS RV Mid diam:    3.10 cm     A Reversal Velocity: 26.10 cm/s RV S prime:     12.15 cm/s  Diastolic Velocity:  49.00 cm/s TAPSE (M-mode): 1.6 cm      S/D Velocity:        1.10 RVSP:           32.2 mmHg   Systolic Velocity:   52.25 cm/s LEFT ATRIUM             Index        RIGHT ATRIUM           Index LA diam:        4.30 cm 2.43 cm/m   RA Pressure: 3.00 mmHg LA Vol (A2C):   50.2 ml 28.41 ml/m  RA Area:     15.40 cm LA Vol (A4C):   53.5 ml 30.27 ml/m  RA Volume:   41.40 ml  23.43 ml/m LA Biplane Vol: 55.3 ml 31.29 ml/m  AORTIC VALVE AV Area (Vmax):    1.93 cm AV Area (Vmean):   1.92 cm AV Area (VTI):     2.16 cm AV  Vmax:           200.25 cm/s AV Vmean:          130.500 cm/s AV VTI:  0.461 m AV Peak Grad:      16.0 mmHg AV Mean Grad:      8.2 mmHg LVOT Vmax:         85.65 cm/s LVOT Vmean:        55.400 cm/s LVOT VTI:          0.220 m LVOT/AV VTI ratio: 0.48  AORTA Ao Root diam: 3.45 cm Ao Asc diam:  3.85 cm MITRAL VALVE                  TRICUSPID VALVE MV Area (PHT): cm            TR Peak grad:   29.2 mmHg MV Decel Time: 312 msec       TR Vmax:        270.00 cm/s MR Peak grad:    59.0 mmHg    Estimated RAP:  3.00 mmHg MR Mean grad:    45.0 mmHg    RVSP:           32.2 mmHg MR Vmax:         384.00 cm/s MR Vmean:        321.0 cm/s   SHUNTS MR PISA:         1.37 cm     Systemic VTI:  0.22 m MR PISA Eff ROA: 11 mm       Systemic Diam: 2.40 cm MR PISA Radius:  0.47 cm MV E velocity: 69.45 cm/s MV A velocity: 80.70 cm/s MV E/A ratio:  0.86 Redell Shallow MD Electronically signed by Redell Shallow MD Signature Date/Time: 12/26/2023/12:01:47 PM    Final    DG Chest 2 View Result Date: 12/14/2023 CLINICAL DATA:  Dyspnea.  AVR. EXAM: CHEST - 2 VIEW COMPARISON:  November 23, 2023. FINDINGS: The heart size and mediastinal contours are unchanged. Aortic valve replacement and left atrial appendage clip. Median sternotomy. Aortic atherosclerosis. No focal consolidation, pleural effusion, or pneumothorax. No acute osseous abnormality. IMPRESSION: 1. No acute cardiopulmonary findings. 2.  Aortic Atherosclerosis (ICD10-I70.0). Electronically Signed   By: Harrietta Sherry M.D.   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 No focal airspace consolidation, pleural effusion, or pneumothorax. No cardiomegaly. Sternotomy wires and aortic valve replacement with left atrial appendage clip. Tortuous aorta with aortic atherosclerosis. No acute fracture or  destructive lesions. Multilevel thoracic osteophytosis. IMPRESSION: No acute cardiopulmonary abnormality. Electronically Signed   By: Rogelia Myers M.D.   On: 11/23/2023 13:17   .suture removal       ASSESSMENT & PLAN   Assessment & Plan Encounter for removal of sutures Encounter for wound care Laceration of scalp, subsequent encounter Sustained from a fall. Stitches removed today, and Steri-Strips applied for 3-7 days for additional support. Monitor for signs of infection or wound dehiscence. Stroke, recent, without late effect A recent CT scan suggested possible new strokes, but no clinical symptoms are present. High blood sugar levels may have contributed. Differential diagnosis includes stroke, QT prolongation, and dehydration, with dehydration being less likely. Prescribed Plavix  and continued aspirin  therapy to reduce stroke risk. Discussed the Watchman device with a cardiologist for stroke prevention. Monitor for signs of stroke or bleedin Long QT interval Prolonged QT interval on EKG may contribute to cardiac events. Amitriptyline  and doxepin , identified as QT prolonging, have been  discontinued. Monitor QT interval and cardiac function. Type 2 diabetes mellitus with hyperglycemia, without long-term current use of insulin  (HCC) Significantly elevated blood sugar levels at the time of the fall may have contributed to dehydration and syncope. Tighter glycemic control is necessary. Continue to monitor blood glucose levels closely and adjust the diabetes management plan. Chronic kidney disease, stage 4 (severe) (HCC) Kidney function was well-managed during recent hospitalization. Continue current management and monitoring. Secondary hypertension Blood pressure was elevated during hospitalization and is currently slightly elevated. Blood pressure medication was not taken this morning. Reassess antihypertensive regimen and monitor blood pressure regularly. Close follow up planned. [facility]    ORDER ASSOCIATIONS  #   DIAGNOSIS / CONDITION ICD-10 ENCOUNTER ORDER     ICD-10-CM   1. Encounter for removal of sutures  Z48.02     2. Encounter for wound care  Z51.89     3. Stroke, recent, without late effect  Z86.73     4. Long QT interval  R94.31     5. Type 2 diabetes mellitus with hyperglycemia, without long-term current use of insulin  (HCC)  E11.65     6. Chronic kidney disease, stage 4 (severe) (HCC)  N18.4     7. Laceration of scalp, subsequent encounter  S01.01XD     8. Secondary hypertension  I15.9          Orders Placed in Encounter:   Meds ordered this encounter  Medications   clopidogrel  (PLAVIX ) 75 MG tablet    Sig: Take 1 tablet (75 mg total) by mouth daily.    Dispense:  90 tablet    Refill:  3   Stop amitriptyline /doxepin  due to prolonged qtc      This document was synthesized by artificial intelligence ([employer]) using HIPAA-compliant recording of the clinical interaction;   We discussed the use of AI scribe software for clinical note transcription with the patient, who gave verbal consent to proceed. additional Info: This encounter employed state-of-the-art, real-time, collaborative documentation. The patient actively reviewed and assisted in updating their electronic medical record on a shared screen, ensuring transparency and facilitating joint problem-solving for the problem list, overview, and plan. This approach promotes accurate, informed care. The treatment plan was discussed and reviewed in detail, including medication safety, potential side effects, and all patient questions. We confirmed understanding and comfort with the plan. Follow-up instructions were established, including contacting the office for any concerns, returning if symptoms worsen, persist, or new symptoms develop, and precautions for potential emergency department visits.

## 2024-02-13 NOTE — Assessment & Plan Note (Signed)
 Prolonged QT interval on EKG may contribute to cardiac events. Amitriptyline  and doxepin , identified as QT prolonging, have been discontinued. Monitor QT interval and cardiac function.

## 2024-02-19 ENCOUNTER — Ambulatory Visit: Admitting: Internal Medicine

## 2024-03-15 ENCOUNTER — Ambulatory Visit: Admitting: Internal Medicine

## 2024-04-18 ENCOUNTER — Ambulatory Visit: Admitting: Emergency Medicine

## 2024-05-30 ENCOUNTER — Inpatient Hospital Stay: Admitting: Neurology
